# Patient Record
Sex: Female | Born: 1950
Health system: Southern US, Community
[De-identification: ages and names within clinical notes are randomized; demographics above are authoritative.]

## PROBLEM LIST (undated history)

## (undated) DIAGNOSIS — F32A Depression, unspecified: Secondary | ICD-10-CM

## (undated) DIAGNOSIS — I10 Essential (primary) hypertension: Secondary | ICD-10-CM

## (undated) DIAGNOSIS — Z9889 Other specified postprocedural states: Secondary | ICD-10-CM

## (undated) DIAGNOSIS — M1990 Unspecified osteoarthritis, unspecified site: Secondary | ICD-10-CM

## (undated) DIAGNOSIS — F419 Anxiety disorder, unspecified: Secondary | ICD-10-CM

## (undated) DIAGNOSIS — K219 Gastro-esophageal reflux disease without esophagitis: Secondary | ICD-10-CM

## (undated) DIAGNOSIS — F329 Major depressive disorder, single episode, unspecified: Secondary | ICD-10-CM

## (undated) DIAGNOSIS — E119 Type 2 diabetes mellitus without complications: Secondary | ICD-10-CM

## (undated) DIAGNOSIS — E785 Hyperlipidemia, unspecified: Secondary | ICD-10-CM

## (undated) DIAGNOSIS — J45909 Unspecified asthma, uncomplicated: Secondary | ICD-10-CM

## (undated) DIAGNOSIS — M797 Fibromyalgia: Secondary | ICD-10-CM

## (undated) DIAGNOSIS — R112 Nausea with vomiting, unspecified: Secondary | ICD-10-CM

## (undated) HISTORY — PX: SHOULDER ARTHROSCOPY: SHX128

## (undated) HISTORY — PX: DILATION AND CURETTAGE OF UTERUS: SHX78

## (undated) HISTORY — PX: TUBAL LIGATION: SHX77

## (undated) HISTORY — PX: COLONOSCOPY: SHX174

## (undated) HISTORY — PX: KNEE ARTHROSCOPY: SHX127

---

## 1998-04-18 ENCOUNTER — Other Ambulatory Visit: Admission: RE | Admit: 1998-04-18 | Discharge: 1998-04-18 | Payer: Self-pay | Admitting: Obstetrics and Gynecology

## 1999-04-18 ENCOUNTER — Other Ambulatory Visit: Admission: RE | Admit: 1999-04-18 | Discharge: 1999-04-18 | Payer: Self-pay | Admitting: Obstetrics and Gynecology

## 2000-03-10 ENCOUNTER — Encounter: Admission: RE | Admit: 2000-03-10 | Discharge: 2000-03-10 | Payer: Self-pay | Admitting: Family Medicine

## 2000-03-10 ENCOUNTER — Encounter: Payer: Self-pay | Admitting: Family Medicine

## 2000-04-15 ENCOUNTER — Other Ambulatory Visit: Admission: RE | Admit: 2000-04-15 | Discharge: 2000-04-15 | Payer: Self-pay | Admitting: Obstetrics and Gynecology

## 2001-02-17 ENCOUNTER — Encounter: Admission: RE | Admit: 2001-02-17 | Discharge: 2001-02-17 | Payer: Self-pay | Admitting: Obstetrics and Gynecology

## 2001-02-17 ENCOUNTER — Encounter: Payer: Self-pay | Admitting: Obstetrics and Gynecology

## 2001-04-15 ENCOUNTER — Other Ambulatory Visit: Admission: RE | Admit: 2001-04-15 | Discharge: 2001-04-15 | Payer: Self-pay | Admitting: Obstetrics and Gynecology

## 2001-07-16 ENCOUNTER — Ambulatory Visit (HOSPITAL_COMMUNITY): Admission: RE | Admit: 2001-07-16 | Discharge: 2001-07-16 | Payer: Self-pay | Admitting: Gastroenterology

## 2001-08-20 ENCOUNTER — Emergency Department (HOSPITAL_COMMUNITY): Admission: EM | Admit: 2001-08-20 | Discharge: 2001-08-20 | Payer: Self-pay | Admitting: Emergency Medicine

## 2001-08-20 ENCOUNTER — Encounter: Payer: Self-pay | Admitting: Emergency Medicine

## 2001-09-02 ENCOUNTER — Encounter: Payer: Self-pay | Admitting: Family Medicine

## 2001-09-02 ENCOUNTER — Encounter: Admission: RE | Admit: 2001-09-02 | Discharge: 2001-09-02 | Payer: Self-pay | Admitting: Family Medicine

## 2001-11-27 ENCOUNTER — Ambulatory Visit (HOSPITAL_BASED_OUTPATIENT_CLINIC_OR_DEPARTMENT_OTHER): Admission: RE | Admit: 2001-11-27 | Discharge: 2001-11-27 | Payer: Self-pay | Admitting: Orthopedic Surgery

## 2002-04-15 ENCOUNTER — Other Ambulatory Visit: Admission: RE | Admit: 2002-04-15 | Discharge: 2002-04-15 | Payer: Self-pay | Admitting: Obstetrics and Gynecology

## 2002-04-23 ENCOUNTER — Encounter: Admission: RE | Admit: 2002-04-23 | Discharge: 2002-04-23 | Payer: Self-pay | Admitting: Obstetrics and Gynecology

## 2002-04-23 ENCOUNTER — Encounter: Payer: Self-pay | Admitting: Obstetrics and Gynecology

## 2003-04-20 ENCOUNTER — Other Ambulatory Visit: Admission: RE | Admit: 2003-04-20 | Discharge: 2003-04-20 | Payer: Self-pay | Admitting: Obstetrics and Gynecology

## 2003-09-17 ENCOUNTER — Encounter: Admission: RE | Admit: 2003-09-17 | Discharge: 2003-09-17 | Payer: Self-pay | Admitting: Orthopedic Surgery

## 2003-11-23 ENCOUNTER — Ambulatory Visit (HOSPITAL_BASED_OUTPATIENT_CLINIC_OR_DEPARTMENT_OTHER): Admission: RE | Admit: 2003-11-23 | Discharge: 2003-11-23 | Payer: Self-pay | Admitting: Orthopedic Surgery

## 2003-11-23 ENCOUNTER — Ambulatory Visit (HOSPITAL_COMMUNITY): Admission: RE | Admit: 2003-11-23 | Discharge: 2003-11-23 | Payer: Self-pay | Admitting: Orthopedic Surgery

## 2004-02-09 ENCOUNTER — Ambulatory Visit (HOSPITAL_COMMUNITY): Admission: RE | Admit: 2004-02-09 | Discharge: 2004-02-09 | Payer: Self-pay | Admitting: Orthopedic Surgery

## 2004-04-25 ENCOUNTER — Other Ambulatory Visit: Admission: RE | Admit: 2004-04-25 | Discharge: 2004-04-25 | Payer: Self-pay | Admitting: Obstetrics and Gynecology

## 2004-04-26 ENCOUNTER — Encounter: Admission: RE | Admit: 2004-04-26 | Discharge: 2004-04-26 | Payer: Self-pay | Admitting: Obstetrics and Gynecology

## 2004-10-17 ENCOUNTER — Encounter: Admission: RE | Admit: 2004-10-17 | Discharge: 2004-10-17 | Payer: Self-pay | Admitting: Rheumatology

## 2005-12-17 ENCOUNTER — Other Ambulatory Visit: Admission: RE | Admit: 2005-12-17 | Discharge: 2005-12-17 | Payer: Self-pay | Admitting: Obstetrics and Gynecology

## 2005-12-25 ENCOUNTER — Ambulatory Visit: Admission: RE | Admit: 2005-12-25 | Discharge: 2005-12-25 | Payer: Self-pay | Admitting: Orthopedic Surgery

## 2006-01-03 ENCOUNTER — Encounter: Admission: RE | Admit: 2006-01-03 | Discharge: 2006-01-03 | Payer: Self-pay | Admitting: Orthopedic Surgery

## 2006-12-23 ENCOUNTER — Other Ambulatory Visit: Admission: RE | Admit: 2006-12-23 | Discharge: 2006-12-23 | Payer: Self-pay | Admitting: Obstetrics and Gynecology

## 2007-03-20 ENCOUNTER — Emergency Department (HOSPITAL_COMMUNITY): Admission: EM | Admit: 2007-03-20 | Discharge: 2007-03-20 | Payer: Self-pay | Admitting: Emergency Medicine

## 2007-04-07 ENCOUNTER — Encounter: Admission: RE | Admit: 2007-04-07 | Discharge: 2007-04-07 | Payer: Self-pay | Admitting: Obstetrics and Gynecology

## 2008-01-31 ENCOUNTER — Emergency Department (HOSPITAL_COMMUNITY): Admission: EM | Admit: 2008-01-31 | Discharge: 2008-01-31 | Payer: Self-pay | Admitting: Emergency Medicine

## 2009-11-30 ENCOUNTER — Encounter: Admission: RE | Admit: 2009-11-30 | Discharge: 2009-11-30 | Payer: Self-pay | Admitting: Family Medicine

## 2010-02-26 ENCOUNTER — Encounter: Admission: RE | Admit: 2010-02-26 | Discharge: 2010-02-26 | Payer: Self-pay | Admitting: Family Medicine

## 2010-11-18 ENCOUNTER — Encounter: Payer: Self-pay | Admitting: Family Medicine

## 2011-03-15 NOTE — Procedures (Signed)
[place]. [facility]  Patient:    [name], [name] Visit Number: [id number] MRN: [id number]          Service Type: Attending:  Mickeal Skinner, M.D. Dictated by:   Mickeal Skinner, M.D. Proc. Date: 07/16/01   CC:         Milford Cage. Laurann Montana, M.D.   Procedure Report  DATE OF BIRTH:  08-14-1951.  REFERRING PHYSICIAN:  Milford Cage. Laurann Montana, M.D.  PROCEDURE PERFORMED:  Colonoscopy.  ENDOSCOPIST:  Mickeal Skinner, M.D.  INDICATIONS FOR PROCEDURE:  The patient is a 60 year old female with Hemoccult positive stool.  PREMEDICATION:  Versed 7.5 mg, fentanyl 75 mcg.  ENDOSCOPE:  Olympus pediatric video colonoscope.  DESCRIPTION OF PROCEDURE:  After obtaining informed consent, the patient was placed in the left lateral decubitus position.  I administered intravenous fentanyl and intravenous Versed to achieve conscious sedation for the procedure.  The patients blood pressure, oxygen saturation and cardiac rhythm were monitored throughout the procedure and documented in the medical record.  Anal inspection was normal.  Digital rectal exam was normal.  The Olympus pediatric video colonoscope was then introduced into the rectum and under direct vision, advanced to the cecum.  Colonic preparation for the exam today was excellent.  Ms. Camporeale does have universal colonic diverticulosis without signs of bleeding or diverticulitis.  Rectum:  Normal.  Sigmoid colon and descending colon:  Normal.  Splenic flexure:  Normal.  Transverse colon:  Normal.  Hepatic flexure:  Normal.  Ascending colon:  Normal.  Cecum and ileocecal valve:  Normal.  ASSESSMENT:  Universal colonic diverticulosis; otherwise normal proctocolonoscopy to the cecum.  No endoscopic evidence for the presence of colorectal neoplasia. Dictated by:   Mickeal Skinner, M.D. Attending:  Mickeal Skinner, M.D. DD:  07/16/01 TD:  07/16/01 Job: [id number]

## 2011-03-15 NOTE — Op Note (Signed)
NAME:  ALICEIA, LAMONICA                         ACCOUNT NO.:  [id number]   MEDICAL RECORD NO.:  [id number]                   PATIENT TYPE:  AMB   LOCATION:  [place]                                  FACILITY:  [facility]   PHYSICIAN:  Estill Bamberg. Ronnie Derby, M.D.              DATE OF BIRTH:  01-17-1951   DATE OF PROCEDURE:  11/23/2003  DATE OF DISCHARGE:                                 OPERATIVE REPORT   PREOPERATIVE DIAGNOSIS:  Left knee medial meniscal tear.   POSTOPERATIVE DIAGNOSIS:  Left knee medial meniscal tear.   OPERATION PERFORMED:  Left knee meniscus repair.   SURGEON:  Estill Bamberg. Ronnie Derby, M.D.   ANESTHESIA:  General.   INDICATIONS FOR PROCEDURE:  The patient is a 60 year old female with MRI  evidence of a meniscus tear and clinical evidence of a meniscus tear as well  as patellofemoral osteoarthritis.  Informed consent was obtained.   DESCRIPTION OF PROCEDURE:  The patient was taken to the operating room and  laid in a supine position and administered general LMA anesthesia.  The left  lower extremity was prepped and draped in the usual sterile fashion.  The  left knee was then prepped and draped in the usual sterile fashion.  Inferolateral and inferomedial portals were created with a #11 blade and  blunt trocar and cannula.  Diagnostic arthroscopy revealed an area of 1 x 1  cm grade 3 to 4 chondromalacia in the inferior trochlea.  It appeared that  this had been previously microfractured.  There was no loose cartilage.  It  appeared to be a stable lesion and the patella, interestingly enough had  very little chondromalacia.  The lateral compartment was completely normal.  ACL and PCL were normal.  The medial femoral condyle had some very small  areas of grade 1 to 2 chondromalacia and the posterior horn of the medial  meniscus had a very obvious undersurface horizontal peripheral tear.  I used  a small great white shaver to debride this interval as well as a rasp and  then I placed a  single clear-fix screw in the posterior horn of the meniscus  which gave excellent fixation.  I then irrigated the knee, took a further  tour to ensure there were no loose bodies and then evacuated the knee.  I  infiltrated both portals with 10 mL of a Marcaine morphine mixture and  closed with interrupted 4-0 nylon sutures, dressed with Xeroform dressing  sponges, sterile Webril and Ace wrap.   TOURNIQUET TIME:  None.   COMPLICATIONS:  None.   ESTIMATED BLOOD LOSS:  Minimal.                                               Estill Bamberg. Ronnie Derby, M.D.    SDL/MEDQ  D:  11/23/2003  T:  11/23/2003  Job:  [id number]

## 2011-03-15 NOTE — Op Note (Signed)
[place]. [facility]  Patient:    [name], [name] Visit Number: [id number] MRN: [id number]          Service Type: DSU Location: [facility] Attending Physician:  Lowella Petties Dictated by:   Alta Corning, M.D. Proc. Date: 11/27/01 Admit Date:  11/27/2001                             Operative Report  PREOPERATIVE DIAGNOSIS:  Patellofemoral pain with known posterior cruciate injury.  POSTOPERATIVE DIAGNOSIS:  Patellofemoral pain with known posterior cruciate injury.  PRINCIPAL PROCEDURE: 1. Debridement of femoral trochlea. 2. Debridement of fibers of posterior cruciate ligament. 3. Lateral retinacular release.  SURGEON:  Alta Corning, M.D.  ASSISTANT:  Alvina Filbert. Natividad Brood.  ANESTHESIA:  General.  BRIEF HISTORY:  She is a 59 year old female with a long history of having knee pain after a fall down the stairs.  She had an MRI which showed that she had a posterior cruciate injury.  We could not convince ourselves that she was unstable preoperatively, but she did continue have what appeared to anterior knee pain.  Because of her persistent complaints of anterior knee pain, the patient was ultimately taken to the operating room for evaluation under anesthesia and arthroscopy.  DESCRIPTION OF PROCEDURE:  The patient was brought to the operating room and after adequate anesthesia was obtained with a general anesthetic, the patient was placed on the operating table.  The left leg was then examined under anesthesia.  She was certainly noted to have some increased instability compared to the opposite side, but did have a stable posterior drawer as well as anterior drawer, although the laxity was probably a centimeter total, where on the opposite side, it was probably 5 mm.  It was felt based on this that she did not need any kind of cruciate reconstruction.  At this point, the leg was prepped and draped in the usual sterile fashion. Routine  arthroscopic examination revealed the medial side was without significant abnormality.  No significant evidence of arthritis.  The lateral side showed some fray of the tibial plateau which was debrided with the suction shaver.  Attention was then turned to the fibers of the posterior cruciate ligament which were debrided as they stuck out of the medial compartment, although a formal debridement of the posterior cruciate was not undertaken.  At this point, attention was turned up in the patellofemoral joint, where there was noted to be some lateral patellar tracking as well as grade 2 or 3 changes in the femoral trochlea.  The femoral trochlea was debrided with the suction shaver back to a smooth and stable rim.  Attention was then turned to the lateral retinaculum where a lateral retinacular release was performed.  The patella then did allow elevation and more midline tracking.  At this point, the knee was copiously irrigated and suctioned dry.  A sterile compressive dressing was applied and the patient taken to the recovery room where she was noted to be in satisfactory condition.  Estimated blood loss for the procedure was none. Dictated by:   Alta Corning, M.D. Attending Physician:  Lowella Petties DD:  11/27/01 TD:  11/27/01 Job: [id number]

## 2011-11-26 ENCOUNTER — Other Ambulatory Visit (HOSPITAL_COMMUNITY)
Admission: RE | Admit: 2011-11-26 | Discharge: 2011-11-26 | Disposition: A | Discharge status: Home / Self Care | Payer: Medicare Other | Source: Ambulatory Visit | Attending: Family Medicine | Admitting: Family Medicine

## 2011-11-26 ENCOUNTER — Other Ambulatory Visit: Payer: Self-pay | Admitting: Family Medicine

## 2011-11-26 DIAGNOSIS — Z124 Encounter for screening for malignant neoplasm of cervix: Secondary | ICD-10-CM | POA: Insufficient documentation

## 2011-11-28 ENCOUNTER — Other Ambulatory Visit: Payer: Self-pay | Admitting: Family Medicine

## 2011-11-28 DIAGNOSIS — Z1231 Encounter for screening mammogram for malignant neoplasm of breast: Secondary | ICD-10-CM

## 2011-12-18 ENCOUNTER — Ambulatory Visit
Admission: RE | Admit: 2011-12-18 | Discharge: 2011-12-18 | Disposition: A | Discharge status: Home / Self Care | Payer: Medicare Other | Source: Ambulatory Visit | Attending: Family Medicine | Admitting: Family Medicine

## 2011-12-18 DIAGNOSIS — Z1231 Encounter for screening mammogram for malignant neoplasm of breast: Secondary | ICD-10-CM

## 2012-08-07 ENCOUNTER — Other Ambulatory Visit: Payer: Self-pay | Admitting: Orthopedic Surgery

## 2012-08-10 ENCOUNTER — Encounter (HOSPITAL_BASED_OUTPATIENT_CLINIC_OR_DEPARTMENT_OTHER): Payer: Self-pay | Admitting: *Deleted

## 2012-08-10 NOTE — Progress Notes (Signed)
To come in for bmet-will call for ekg dr Laurann Montana

## 2012-08-11 ENCOUNTER — Encounter (HOSPITAL_BASED_OUTPATIENT_CLINIC_OR_DEPARTMENT_OTHER)
Admission: RE | Admit: 2012-08-11 | Discharge: 2012-08-11 | Disposition: A | Discharge status: Home / Self Care | Payer: Medicare Other | Source: Ambulatory Visit | Attending: Orthopedic Surgery | Admitting: Orthopedic Surgery

## 2012-08-11 LAB — BASIC METABOLIC PANEL
BUN: 16 mg/dL (ref 6–23)
CO2: 27 mEq/L (ref 19–32)
Calcium: 9.2 mg/dL (ref 8.4–10.5)
Chloride: 107 mEq/L (ref 96–112)
Creatinine, Ser: 0.89 mg/dL (ref 0.50–1.10)
GFR calc Af Amer: 78 mL/min — ABNORMAL LOW (ref >90)
GFR calc non Af Amer: 68 mL/min — ABNORMAL LOW (ref >90)
Glucose, Bld: 90 mg/dL (ref 70–99)
Potassium: 4.8 mEq/L (ref 3.5–5.1)
Sodium: 139 mEq/L (ref 135–145)

## 2012-08-12 NOTE — H&P (Signed)
[name] is an 61 y.o. female.   Chief Complaint: c/o chronic and progressive STS symptoms of the right long finger HPI: She has pain in her long finger, a flexion contracture of her left finger PIP joint, swelling and tenderness over the palm in the region of the flexor sheath to the long finger at the A-1 pulley.  She also has background osteoarthritis of her IP joints and numbness in her median innervated fingers.    Past Medical History  Diagnosis Date  . Arthritis   . Fibromyalgia   . GERD (gastroesophageal reflux disease)   . PONV (postoperative nausea and vomiting)   . Hypertension   . Asthma   . Depression   . Anxiety   . Diabetes mellitus without complication   . Hyperlipemia     Past Surgical History  Procedure Date  . Knee arthroscopy [id number]    left  . Shoulder arthroscopy     left  . Tubal ligation   . Dilation and curettage of uterus   . Colonoscopy     No family history on file. Social History:  reports that she quit smoking about 2 years ago. She does not have any smokeless tobacco history on file. She reports that she does not drink alcohol or use illicit drugs.  Allergies:  Allergies  Allergen Reactions  . Asa (Aspirin) Nausea And Vomiting  . Sulfa Antibiotics Hives  . Talwin (Pentazocine)     nervous  . Tetracyclines & Related Hives    No prescriptions prior to admission    Results for orders placed during the hospital encounter of 08/13/12 (from the past 48 hour(s))  BASIC METABOLIC PANEL     Status: Abnormal   Collection Time   08/11/12 12:10 PM      Component Value Range Comment   Sodium 139  135 - 145 mEq/L    Potassium 4.8  3.5 - 5.1 mEq/L    Chloride 107  96 - 112 mEq/L    CO2 27  19 - 32 mEq/L    Glucose, Bld 90  70 - 99 mg/dL    BUN 16  6 - 23 mg/dL    Creatinine, Ser 0.89  0.50 - 1.10 mg/dL    Calcium 9.2  8.4 - 10.5 mg/dL    GFR calc non Af Amer 68 (*) >90 mL/min    GFR calc Af Amer 78 (*) >90 mL/min     No results  found.   Pertinent items are noted in HPI.  Height 5\' 4"  (1.626 m), weight 74.39 kg (164 lb).  General appearance: alert Head: Normocephalic, without obvious abnormality Neck: supple, symmetrical, trachea midline Resp: clear to auscultation bilaterally Cardio: regular rate and rhythm GI: normal findings: bowel sounds normal Extremities: She has obvious osteoarthritis with Heberden's and Bouchard's nodes.  She has a 10 degree flexion contracture of her right long finger PIP joint.  She is tender over the A-1 pulley.  She has active stenosing tenosynovitis of her right long finger at the A-1 pulley.  She has clinical signs consistent with carpal tunnel syndrome.  Pulses: 2+ and symmetric Skin: normal Neurologic: Grossly normal    Assessment/Plan Impression: Right long finger STS  Plan: To the OR for release A-1 pulley right long finger.The procedure, risks,benefits and post-op course were discussed with the patient at length and they were in agreement with the plan.   DASNOIT,[name] 08/12/2012, 2:53 PM     H&P documentation: 08/13/2012  -History and Physical Reviewed  -  Patient has been re-examined  -No change in the plan of care  [name], MD

## 2012-08-13 ENCOUNTER — Ambulatory Visit (HOSPITAL_BASED_OUTPATIENT_CLINIC_OR_DEPARTMENT_OTHER): Payer: Medicare Other | Admitting: Certified Registered"

## 2012-08-13 ENCOUNTER — Encounter (HOSPITAL_BASED_OUTPATIENT_CLINIC_OR_DEPARTMENT_OTHER): Payer: Self-pay | Admitting: Certified Registered"

## 2012-08-13 ENCOUNTER — Ambulatory Visit (HOSPITAL_BASED_OUTPATIENT_CLINIC_OR_DEPARTMENT_OTHER)
Admission: RE | Admit: 2012-08-13 | Discharge: 2012-08-13 | Disposition: A | Discharge status: Home / Self Care | Payer: Medicare Other | Source: Ambulatory Visit | Attending: Orthopedic Surgery | Admitting: Orthopedic Surgery

## 2012-08-13 ENCOUNTER — Encounter (HOSPITAL_BASED_OUTPATIENT_CLINIC_OR_DEPARTMENT_OTHER)
Admission: RE | Disposition: A | Discharge status: Home / Self Care | Payer: Self-pay | Source: Ambulatory Visit | Attending: Orthopedic Surgery

## 2012-08-13 ENCOUNTER — Encounter (HOSPITAL_BASED_OUTPATIENT_CLINIC_OR_DEPARTMENT_OTHER): Payer: Self-pay | Admitting: *Deleted

## 2012-08-13 DIAGNOSIS — E119 Type 2 diabetes mellitus without complications: Secondary | ICD-10-CM | POA: Insufficient documentation

## 2012-08-13 DIAGNOSIS — I10 Essential (primary) hypertension: Secondary | ICD-10-CM | POA: Insufficient documentation

## 2012-08-13 DIAGNOSIS — M65839 Other synovitis and tenosynovitis, unspecified forearm: Secondary | ICD-10-CM | POA: Insufficient documentation

## 2012-08-13 DIAGNOSIS — K219 Gastro-esophageal reflux disease without esophagitis: Secondary | ICD-10-CM | POA: Insufficient documentation

## 2012-08-13 DIAGNOSIS — M20099 Other deformity of finger(s), unspecified finger(s): Secondary | ICD-10-CM | POA: Insufficient documentation

## 2012-08-13 HISTORY — DX: Gastro-esophageal reflux disease without esophagitis: K21.9

## 2012-08-13 HISTORY — PX: TRIGGER FINGER RELEASE: SHX641

## 2012-08-13 HISTORY — DX: Unspecified osteoarthritis, unspecified site: M19.90

## 2012-08-13 HISTORY — DX: Major depressive disorder, single episode, unspecified: F32.9

## 2012-08-13 HISTORY — DX: Nausea with vomiting, unspecified: R11.2

## 2012-08-13 HISTORY — DX: Hyperlipidemia, unspecified: E78.5

## 2012-08-13 HISTORY — DX: Depression, unspecified: F32.A

## 2012-08-13 HISTORY — DX: Anxiety disorder, unspecified: F41.9

## 2012-08-13 HISTORY — DX: Type 2 diabetes mellitus without complications: E11.9

## 2012-08-13 HISTORY — DX: Unspecified asthma, uncomplicated: J45.909

## 2012-08-13 HISTORY — DX: Essential (primary) hypertension: I10

## 2012-08-13 HISTORY — DX: Other specified postprocedural states: Z98.890

## 2012-08-13 HISTORY — DX: Fibromyalgia: M79.7

## 2012-08-13 LAB — GLUCOSE, CAPILLARY
Glucose-Capillary: 106 mg/dL — ABNORMAL HIGH (ref 70–99)
Glucose-Capillary: 81 mg/dL (ref 70–99)

## 2012-08-13 LAB — POCT HEMOGLOBIN-HEMACUE: Hemoglobin: 12.4 g/dL (ref 12.0–15.0)

## 2012-08-13 SURGERY — RELEASE, A1 PULLEY, FOR TRIGGER FINGER
Anesthesia: Monitor Anesthesia Care | Site: Finger | Laterality: Right | Wound class: Clean

## 2012-08-13 MED ORDER — MIDAZOLAM HCL 5 MG/5ML IJ SOLN
INTRAMUSCULAR | Status: DC | PRN
  Administered 2012-08-13: 2 mg via INTRAVENOUS

## 2012-08-13 MED ORDER — HYDROCODONE-ACETAMINOPHEN 5-325 MG PO TABS: ORAL_TABLET | ORAL | Status: DC

## 2012-08-13 MED ORDER — OXYCODONE HCL 5 MG PO TABS: 5.0000 mg | ORAL_TABLET | Freq: Once | ORAL | Status: DC | PRN

## 2012-08-13 MED ORDER — CHLORHEXIDINE GLUCONATE 4 % EX LIQD: 60.0000 mL | Freq: Once | CUTANEOUS | Status: DC

## 2012-08-13 MED ORDER — OXYCODONE HCL 5 MG/5ML PO SOLN: 5.0000 mg | Freq: Once | ORAL | Status: DC | PRN

## 2012-08-13 MED ORDER — DROPERIDOL 2.5 MG/ML IJ SOLN: 0.6250 mg | INTRAMUSCULAR | Status: DC | PRN

## 2012-08-13 MED ORDER — PROPOFOL 10 MG/ML IV EMUL
INTRAVENOUS | Status: DC | PRN
  Administered 2012-08-13: 200 ug/kg/min via INTRAVENOUS

## 2012-08-13 MED ORDER — LIDOCAINE HCL (CARDIAC) 20 MG/ML IV SOLN
INTRAVENOUS | Status: DC | PRN
  Administered 2012-08-13: 50 mg via INTRAVENOUS

## 2012-08-13 MED ORDER — LACTATED RINGERS IV SOLN
INTRAVENOUS | Status: DC
  Administered 2012-08-13: 08:00:00 via INTRAVENOUS

## 2012-08-13 MED ORDER — CEPHALEXIN 500 MG PO CAPS: 500.0000 mg | ORAL_CAPSULE | Freq: Three times a day (TID) | ORAL | Status: DC

## 2012-08-13 MED ORDER — LIDOCAINE HCL (PF) 2 % IJ SOLN
INTRAMUSCULAR | Status: DC | PRN
  Administered 2012-08-13: 3 mL

## 2012-08-13 MED ORDER — PROPOFOL 10 MG/ML IV EMUL: INTRAVENOUS | Status: DC | PRN

## 2012-08-13 MED ORDER — HYDROMORPHONE HCL PF 1 MG/ML IJ SOLN
0.2500 mg | INTRAMUSCULAR | Status: DC | PRN
  Administered 2012-08-13: 0.5 mg via INTRAVENOUS

## 2012-08-13 SURGICAL SUPPLY — 32 items
BLADE SURG 15 STRL LF DISP TIS (BLADE) ×1 IMPLANT
BLADE SURG 15 STRL SS (BLADE) ×1
BNDG ELASTIC 2 VLCR STRL LF (GAUZE/BANDAGES/DRESSINGS) ×2 IMPLANT
BNDG ESMARK 4X9 LF (GAUZE/BANDAGES/DRESSINGS) IMPLANT
BRUSH SCRUB EZ PLAIN DRY (MISCELLANEOUS) ×2 IMPLANT
CLOTH BEACON ORANGE TIMEOUT ST (SAFETY) ×2 IMPLANT
CORDS BIPOLAR (ELECTRODE) ×2 IMPLANT
COVER MAYO STAND STRL (DRAPES) ×2 IMPLANT
COVER TABLE BACK 60X90 (DRAPES) ×2 IMPLANT
CUFF TOURNIQUET SINGLE 18IN (TOURNIQUET CUFF) ×2 IMPLANT
DECANTER SPIKE VIAL GLASS SM (MISCELLANEOUS) IMPLANT
DRAPE EXTREMITY T 121X128X90 (DRAPE) ×2 IMPLANT
DRAPE SURG 17X23 STRL (DRAPES) ×2 IMPLANT
GAUZE SPONGE 4X4 12PLY STRL LF (GAUZE/BANDAGES/DRESSINGS) ×4 IMPLANT
GAUZE XEROFORM 1X8 LF (GAUZE/BANDAGES/DRESSINGS) ×2 IMPLANT
GLOVE BIO SURGEON STRL SZ 6.5 (GLOVE) ×2 IMPLANT
GLOVE BIOGEL M STRL SZ7.5 (GLOVE) ×2 IMPLANT
GLOVE BIOGEL PI IND STRL 7.0 (GLOVE) ×1 IMPLANT
GLOVE BIOGEL PI INDICATOR 7.0 (GLOVE) ×1
GLOVE ORTHO TXT STRL SZ7.5 (GLOVE) ×2 IMPLANT
GOWN PREVENTION PLUS XLARGE (GOWN DISPOSABLE) ×2 IMPLANT
GOWN STRL REIN XL XLG (GOWN DISPOSABLE) ×4 IMPLANT
NEEDLE 27GAX1X1/2 (NEEDLE) IMPLANT
PACK BASIN DAY SURGERY FS (CUSTOM PROCEDURE TRAY) ×2 IMPLANT
PAD CAST 4YDX4 CTTN HI CHSV (CAST SUPPLIES) ×1 IMPLANT
PADDING CAST COTTON 4X4 STRL (CAST SUPPLIES) ×1
SPONGE GAUZE 4X4 12PLY (GAUZE/BANDAGES/DRESSINGS) ×2 IMPLANT
STOCKINETTE 4X48 STRL (DRAPES) ×2 IMPLANT
SYR CONTROL 10ML LL (SYRINGE) IMPLANT
TOWEL OR 17X24 6PK STRL BLUE (TOWEL DISPOSABLE) ×2 IMPLANT
UNDERPAD 30X30 INCONTINENT (UNDERPADS AND DIAPERS) ×2 IMPLANT
WATER STERILE IRR 1000ML POUR (IV SOLUTION) IMPLANT

## 2012-08-13 NOTE — Brief Op Note (Signed)
08/13/2012  8:13 AM  PATIENT:  [name]  61 y.o. female  PRE-OPERATIVE DIAGNOSIS:  LOCKING TRIGGER FINGER RIGHT LONG  POST-OPERATIVE DIAGNOSIS:  locking trigger finger right long  PROCEDURE:  Procedure(s) (LRB) with comments: RELEASE TRIGGER FINGER/A-1 PULLEY (Right) - long finger  SURGEON:  Surgeon(s) and Role:    * [name]., MD - Primary  PHYSICIAN ASSISTANT:   ASSISTANTS:Jenille Laszlo Dasnoit,P.A-C   ANESTHESIA:   MAC  EBL:  Total I/O In: 200 [I.V.:200] Out: -   BLOOD ADMINISTERED:none  DRAINS: none   LOCAL MEDICATIONS USED:  XYLOCAINE   SPECIMEN:  No Specimen  DISPOSITION OF SPECIMEN:  N/A  COUNTS:  YES  TOURNIQUET:  * Missing tourniquet times found for documented tourniquets in log:  [id number] *  DICTATION: .Other Dictation: Dictation Number [id number]  PLAN OF CARE: Discharge to home after PACU  PATIENT DISPOSITION:  PACU - hemodynamically stable.

## 2012-08-13 NOTE — Anesthesia Preprocedure Evaluation (Addendum)
Anesthesia Evaluation  Patient identified by MRN, date of birth, ID band Patient awake    Reviewed: Allergy & Precautions, H&P , NPO status , Patient's Chart, lab work & pertinent test results, reviewed documented beta blocker date and time   History of Anesthesia Complications (+) PONV  Airway Mallampati: II TM Distance: >3 FB Neck ROM: Full    Dental  (+) Teeth Intact, Dental Advisory Given and Partial Upper   Pulmonary asthma ,    Pulmonary exam normal       Cardiovascular hypertension, Pt. on medications     Neuro/Psych PSYCHIATRIC DISORDERS Anxiety Depression negative neurological ROS     GI/Hepatic Neg liver ROS, GERD-  Medicated and Controlled,  Endo/Other  diabetes, Well Controlled, Type 1  Renal/GU negative Renal ROS     Musculoskeletal  (+) Fibromyalgia -  Abdominal   Peds  Hematology   Anesthesia Other Findings   Reproductive/Obstetrics                          Anesthesia Physical Anesthesia Plan  ASA: II  Anesthesia Plan: General and MAC   Post-op Pain Management:    Induction: Intravenous  Airway Management Planned: LMA and Simple Face Mask  Additional Equipment:   Intra-op Plan:   Post-operative Plan:   Informed Consent: I have reviewed the patients History and Physical, chart, labs and discussed the procedure including the risks, benefits and alternatives for the proposed anesthesia with the patient or authorized representative who has indicated his/her understanding and acceptance.   Dental advisory given  Plan Discussed with: CRNA, Anesthesiologist and Surgeon  Anesthesia Plan Comments:         Anesthesia Quick Evaluation

## 2012-08-13 NOTE — Transfer of Care (Signed)
Immediate Anesthesia Transfer of Care Note  Patient: [name]  Procedure(s) Performed: Procedure(s) (LRB) with comments: RELEASE TRIGGER FINGER/A-1 PULLEY (Right) - long finger  Patient Location: PACU  Anesthesia Type: MAC  Level of Consciousness: awake, alert , oriented and patient cooperative  Airway & Oxygen Therapy: Patient Spontanous Breathing and Patient connected to face mask oxygen  Post-op Assessment: Report given to PACU RN and Post -op Vital signs reviewed and stable  Post vital signs: Reviewed and stable  Complications: No apparent anesthesia complications

## 2012-08-13 NOTE — Anesthesia Procedure Notes (Signed)
Procedure Name: MAC Date/Time: 08/13/2012 7:57 AM Performed by: Denny Levy Pre-anesthesia Checklist: Patient identified, Timeout performed, Emergency Drugs available, Suction available and Patient being monitored Patient Re-evaluated:Patient Re-evaluated prior to inductionOxygen Delivery Method: Simple face mask Placement Confirmation: positive ETCO2

## 2012-08-13 NOTE — Anesthesia Postprocedure Evaluation (Signed)
Anesthesia Post Note  Patient: [name]  Procedure(s) Performed: Procedure(s) (LRB): RELEASE TRIGGER FINGER/A-1 PULLEY (Right)  Anesthesia type: MAC  Patient location: PACU  Post pain: Pain level controlled  Post assessment: Patient's Cardiovascular Status Stable  Last Vitals:  Filed Vitals:   08/13/12 0830  BP: 92/77  Pulse: 66  Temp:   Resp: 13    Post vital signs: Reviewed and stable  Level of consciousness: sedated  Complications: No apparent anesthesia complications

## 2012-08-14 ENCOUNTER — Encounter (HOSPITAL_BASED_OUTPATIENT_CLINIC_OR_DEPARTMENT_OTHER): Payer: Self-pay | Admitting: Orthopedic Surgery

## 2012-08-14 NOTE — Op Note (Signed)
[name], [name]               ACCOUNT NO.:  [id number]  MEDICAL RECORD NO.:  [id number]  LOCATION:                                 FACILITY:  PHYSICIAN:  Youlanda Mighty. Jakhai Fant, M.D. DATE OF BIRTH:  16-Jul-1951  DATE OF PROCEDURE:  08/13/2012 DATE OF DISCHARGE:                              OPERATIVE REPORT   PREOPERATIVE DIAGNOSIS:  Chronic stenosing tenosynovitis, right long finger at A1 pulley with flexion contracture proximal interphalangeal joint.  POSTOPERATIVE DIAGNOSIS:  Chronic stenosing tenosynovitis, right long finger at A1 pulley with flexion contracture proximal interphalangeal joint.  OPERATIONS:  Release of right long finger A1 pulley and gentle manipulation of PIP joint to release flexion contracture.  OPERATING SURGEON:  Youlanda Mighty. Valeria Krisko, MD  ASSISTANT:  Marily Lente Dasnoit, PA-C  ANESTHESIA:  Lidocaine 2%, flexor sheath block at metacarpal head level block of right long finger.  This was performed with supplemental IV sedation, i.e., monitored anesthesia care.  SUPERVISING ANESTHESIOLOGIST:  Nelda Severe. Tobias Alexander, MD  INDICATION:  [name] is a 61 year old woman referred through courtesy of Dr. Bo Merino for evaluation and management of a chronically swollen stiff and triggering right long finger.  [name] is on chronic disability with diabetes, left arm lymphedema, and multiple other confounding medical problems.  She was referred due to chronic stenosing tenosynovitis to right long finger, unresponsive to nonoperative measures.  On clinical examination, she is noted to have a 10-degree flexion contracture of her right long finger PIP joint.  She has multiple drug allergies.  We recommended proceeding with release of the A1 pulley under local anesthesia and sedation.  After informed consent, she is brought to the operating at this time.  Preoperatively, she had detailed anesthesia informed consent.  She was concerned about a poorly characterized  lymphedema of left arm.  She declined IV access in the left arm and in the external jugular veins. We then placed an IV at her right antecubital fossa and we used an Esmarch to allow a bloodless field for right hand surgery on the forearm.  [name] was transferred to room 1 of the [facility] and placed in supine position on the operating table.  Under Dr. Glynda Jaeger supervision, IV sedation was provided.  The right palm was prepped with Betadine followed by infiltration of 3 mL of 2% plain lidocaine into the path of the intended incision and around the common digital nerves to the index long and ring fingers.  After 5 minutes, excellent anesthesia was achieved.  The right hand and arm were then prepped with Betadine soap and solution, sterilely draped.  Following a routine surgical time-out, the hand and arm were exsanguinated with an Esmarch bandage, it was left on the proximal forearm as a tourniquet.  Procedure commenced with an oblique incision in the distal palmar crease.  Subcutaneous tissues were carefully divided.  Only some pathologic palmar fascia that was released with scissors.  The A1 pulley was invested with inflammatory tenosynovitis.  This was cleared with scissors and Valora Corporal followed by release of the A1 pulley.  A small A0 pulley was identified and released in the fibrotic tenosynovium at the preputial fold was likewise excised.  Thereafter, full passive range of motion of the fingers was recovered in flexion without locking.  We then flexed the MP joint 90 degrees and gently extended the PIP joint.  It extended to 0 degrees.  The wound was then repaired with intradermal 4-0 Prolene suture.  A compressive dressing was applied with Steri-Strips, sterile gauze, and Ace wrap.  There were no apparent complications.  For aftercare, [name] is provided prescription for Keflex 500 mg 1 p.o. q.8 h. x4 days as a prophylactic antibiotic and prescription  for Vicodin 5 mg 1 p.o. q.4-6 h. p.r.n. pain, 15 tablets without refill.     Youlanda Mighty Trica Usery, M.D.     RVS/MEDQ  D:  08/13/2012  T:  08/13/2012  Job:  [id number]  cc:   Leary Roca, MD

## 2013-10-01 ENCOUNTER — Ambulatory Visit (INDEPENDENT_AMBULATORY_CARE_PROVIDER_SITE_OTHER): Payer: Medicare PPO | Admitting: Podiatrist

## 2013-10-01 ENCOUNTER — Encounter: Payer: Self-pay | Admitting: Podiatrist

## 2013-10-01 VITALS — BP 143/86 | HR 74 | Resp 12

## 2013-10-01 DIAGNOSIS — E1049 Type 1 diabetes mellitus with other diabetic neurological complication: Secondary | ICD-10-CM

## 2013-10-01 DIAGNOSIS — M79609 Pain in unspecified limb: Secondary | ICD-10-CM

## 2013-10-01 DIAGNOSIS — Q828 Other specified congenital malformations of skin: Secondary | ICD-10-CM

## 2013-10-01 DIAGNOSIS — M722 Plantar fascial fibromatosis: Secondary | ICD-10-CM

## 2013-10-01 DIAGNOSIS — B351 Tinea unguium: Secondary | ICD-10-CM

## 2013-10-01 NOTE — Progress Notes (Deleted)
   Subjective:    Patient ID: [name], female    DOB: 1951-01-09, 62 y.o.   MRN: [id number]  HPI Comments: '' TOENAILS TRIM''     Review of Systems     Objective:   Physical Exam Nails x 10--- lesions sub right hallux, sub right 4;  Left sub 5th.         Assessment & Plan:

## 2013-10-01 NOTE — Patient Instructions (Signed)
Diabetes and Foot Care Diabetes may cause you to have problems because of poor blood supply (circulation) to your feet and legs. This may cause the skin on your feet to become thinner, break easier, and heal more slowly. Your skin may become dry, and the skin may peel and crack. You may also have nerve damage in your legs and feet causing decreased feeling in them. You may not notice minor injuries to your feet that could lead to infections or more serious problems. Taking care of your feet is one of the most important things you can do for yourself.  HOME CARE INSTRUCTIONS  Wear shoes at all times, even in the house. Do not go barefoot. Bare feet are easily injured.  Check your feet daily for blisters, cuts, and redness. If you cannot see the bottom of your feet, use a mirror or ask someone for help.  Wash your feet with warm water (do not use hot water) and mild soap. Then pat your feet and the areas between your toes until they are completely dry. Do not soak your feet as this can dry your skin.  Apply a moisturizing lotion or petroleum jelly (that does not contain alcohol and is unscented) to the skin on your feet and to dry, brittle toenails. Do not apply lotion between your toes.  Trim your toenails straight across. Do not dig under them or around the cuticle. File the edges of your nails with an emery board or nail file.  Do not cut corns or calluses or try to remove them with medicine.  Wear clean socks or stockings every day. Make sure they are not too tight. Do not wear knee-high stockings since they may decrease blood flow to your legs.  Wear shoes that fit properly and have enough cushioning. To break in new shoes, wear them for just a few hours a day. This prevents you from injuring your feet. Always look in your shoes before you put them on to be sure there are no objects inside.  Do not cross your legs. This may decrease the blood flow to your feet.  If you find a minor scrape,  cut, or break in the skin on your feet, keep it and the skin around it clean and dry. These areas may be cleansed with mild soap and water. Do not cleanse the area with peroxide, alcohol, or iodine.  When you remove an adhesive bandage, be sure not to damage the skin around it.  If you have a wound, look at it several times a day to make sure it is healing.  Do not use heating pads or hot water bottles. They may burn your skin. If you have lost feeling in your feet or legs, you may not know it is happening until it is too late.  Make sure your health care provider performs a complete foot exam at least annually or more often if you have foot problems. Report any cuts, sores, or bruises to your health care provider immediately. SEEK MEDICAL CARE IF:   You have an injury that is not healing.  You have cuts or breaks in the skin.  You have an ingrown nail.  You notice redness on your legs or feet.  You feel burning or tingling in your legs or feet.  You have pain or cramps in your legs and feet.  Your legs or feet are numb.  Your feet always feel cold. SEEK IMMEDIATE MEDICAL CARE IF:   There is increasing redness,   swelling, or pain in or around a wound.  There is a red line that goes up your leg.  Pus is coming from a wound.  You develop a fever or as directed by your health care provider.  You notice a bad smell coming from an ulcer or wound. Document Released: 10/11/2000 Document Revised: 06/16/2013 Document Reviewed: 03/23/2013 [employer] Patient Information 2014 [employer].  

## 2013-10-04 NOTE — Progress Notes (Signed)
HPI:  Patient presents today for follow up of foot and nail care. Denies any new complaints today.  Objective:  Patients chart is reviewed.  Neurovascular status unchanged.  Patients nails are thickened, discolored, distrophic, friable and brittle with yellow-brown discoloration. Patient subjectively relates they are painful with shoes and with ambulation of bilateral feet.  Hyperkeratotic lesions present sub hallux, right; sub 4th metatarsal right, submetatarsal 5 left.   Assessment:  Symptomatic onychomycosis  Plan:  Discussed treatment options and alternatives.  The symptomatic toenails were debrided through manual an mechanical means without complication.  Return appointment recommended at routine intervals of 3 months    Trudie Buckler, DPM

## 2013-10-22 ENCOUNTER — Telehealth: Payer: Self-pay | Admitting: Podiatrist

## 2013-10-22 NOTE — Telephone Encounter (Signed)
PT. STATED THAT HER RT FOOT 1ST AND 2ND TOENAIL STILL HURTING AFTER SEEING DR. Valentina Lucks.  NOTIFIED PT TO SOAK HER FOOT WITH EPSON SALT AND IF ANY DRAINAGE WILL GIVE HER APPOINTMENT TO SEE THE DR.

## 2013-12-09 ENCOUNTER — Ambulatory Visit (INDEPENDENT_AMBULATORY_CARE_PROVIDER_SITE_OTHER): Payer: Medicare PPO | Admitting: Podiatrist

## 2013-12-09 ENCOUNTER — Encounter: Payer: Self-pay | Admitting: Podiatrist

## 2013-12-09 VITALS — BP 140/78 | HR 67 | Resp 12

## 2013-12-09 DIAGNOSIS — B351 Tinea unguium: Secondary | ICD-10-CM

## 2013-12-09 DIAGNOSIS — M79609 Pain in unspecified limb: Secondary | ICD-10-CM

## 2013-12-09 DIAGNOSIS — L608 Other nail disorders: Secondary | ICD-10-CM

## 2013-12-09 MED ORDER — AMOXICILLIN-POT CLAVULANATE 875-125 MG PO TABS: 1.0000 | ORAL_TABLET | Freq: Two times a day (BID) | ORAL | Status: DC

## 2013-12-09 NOTE — Patient Instructions (Signed)
Diabetes and Foot Care Diabetes may cause you to have problems because of poor blood supply (circulation) to your feet and legs. This may cause the skin on your feet to become thinner, break easier, and heal more slowly. Your skin may become dry, and the skin may peel and crack. You may also have nerve damage in your legs and feet causing decreased feeling in them. You may not notice minor injuries to your feet that could lead to infections or more serious problems. Taking care of your feet is one of the most important things you can do for yourself.  HOME CARE INSTRUCTIONS  Wear shoes at all times, even in the house. Do not go barefoot. Bare feet are easily injured.  Check your feet daily for blisters, cuts, and redness. If you cannot see the bottom of your feet, use a mirror or ask someone for help.  Wash your feet with warm water (do not use hot water) and mild soap. Then pat your feet and the areas between your toes until they are completely dry. Do not soak your feet as this can dry your skin.  Apply a moisturizing lotion or petroleum jelly (that does not contain alcohol and is unscented) to the skin on your feet and to dry, brittle toenails. Do not apply lotion between your toes.  Trim your toenails straight across. Do not dig under them or around the cuticle. File the edges of your nails with an emery board or nail file.  Do not cut corns or calluses or try to remove them with medicine.  Wear clean socks or stockings every day. Make sure they are not too tight. Do not wear knee-high stockings since they may decrease blood flow to your legs.  Wear shoes that fit properly and have enough cushioning. To break in new shoes, wear them for just a few hours a day. This prevents you from injuring your feet. Always look in your shoes before you put them on to be sure there are no objects inside.  Do not cross your legs. This may decrease the blood flow to your feet.  If you find a minor scrape,  cut, or break in the skin on your feet, keep it and the skin around it clean and dry. These areas may be cleansed with mild soap and water. Do not cleanse the area with peroxide, alcohol, or iodine.  When you remove an adhesive bandage, be sure not to damage the skin around it.  If you have a wound, look at it several times a day to make sure it is healing.  Do not use heating pads or hot water bottles. They may burn your skin. If you have lost feeling in your feet or legs, you may not know it is happening until it is too late.  Make sure your health care provider performs a complete foot exam at least annually or more often if you have foot problems. Report any cuts, sores, or bruises to your health care provider immediately. SEEK MEDICAL CARE IF:   You have an injury that is not healing.  You have cuts or breaks in the skin.  You have an ingrown nail.  You notice redness on your legs or feet.  You feel burning or tingling in your legs or feet.  You have pain or cramps in your legs and feet.  Your legs or feet are numb.  Your feet always feel cold. SEEK IMMEDIATE MEDICAL CARE IF:   There is increasing redness,   swelling, or pain in or around a wound.  There is a red line that goes up your leg.  Pus is coming from a wound.  You develop a fever or as directed by your health care provider.  You notice a bad smell coming from an ulcer or wound. Document Released: 10/11/2000 Document Revised: 06/16/2013 Document Reviewed: 03/23/2013 [employer] Patient Information 2014 [employer].  

## 2013-12-09 NOTE — Progress Notes (Signed)
''   TOENAILS TRIM AND THE RT FOOT 1ST AND 2ND TOENAIL IS A LITTLE SORE.''  HPI: Patient presents today for follow up of foot and nail care. Denies any new complaints today.  Objective: Patients chart is reviewed. Neurovascular status unchanged. Patients nails are thickened, discolored, distrophic, friable and brittle with yellow-brown discoloration. Patient subjectively relates they are painful with shoes and with ambulation of bilateral feet. No sign of infection or irritation at today's visit.  Hyperkeratotic lesions present sub hallux, right; sub 4th metatarsal right, submetatarsal 5 left.  Assessment: Symptomatic onychomycosis  Plan: Discussed treatment options and alternatives. The symptomatic toenails were debrided through manual an mechanical means without complication. Return appointment recommended at routine intervals of 3 months

## 2014-02-24 ENCOUNTER — Ambulatory Visit (INDEPENDENT_AMBULATORY_CARE_PROVIDER_SITE_OTHER): Payer: Medicare PPO | Admitting: Podiatrist

## 2014-02-24 VITALS — Resp 16 | Ht 65.0 in | Wt 166.0 lb

## 2014-02-24 DIAGNOSIS — M79609 Pain in unspecified limb: Secondary | ICD-10-CM

## 2014-02-24 DIAGNOSIS — B351 Tinea unguium: Secondary | ICD-10-CM

## 2014-02-24 NOTE — Progress Notes (Signed)
   HPI:  Patient presents today for follow up of foot and nail care. Denies any new complaints today.  Objective:  Patients chart is reviewed.  Vascular status rev3eals pedal pulses noted at 2 out of 4 dp and pt bilateral .  Neurological sensation is Normal to [employer] monofilament bilateral.  Patients nails are thickened, discolored, distrophic, friable and brittle with yellow-brown discoloration. Patient subjectively relates they are painful with shoes and with ambulation of bilateral feet. She is diabetic however, she does not meet class findings therefore diabetic shoes are not approved by me today.  Assessment:  Symptomatic onychomycosis  Plan:  Discussed treatment options and alternatives.  The symptomatic toenails were debrided through manual an mechanical means without complication.  Return appointment recommended at routine intervals of 3 months    Trudie Buckler, DPM

## 2014-05-26 ENCOUNTER — Ambulatory Visit (INDEPENDENT_AMBULATORY_CARE_PROVIDER_SITE_OTHER): Payer: Medicare PPO | Admitting: Podiatrist

## 2014-05-26 DIAGNOSIS — M79673 Pain in unspecified foot: Secondary | ICD-10-CM

## 2014-05-26 DIAGNOSIS — E1049 Type 1 diabetes mellitus with other diabetic neurological complication: Secondary | ICD-10-CM

## 2014-05-26 DIAGNOSIS — M205X9 Other deformities of toe(s) (acquired), unspecified foot: Secondary | ICD-10-CM

## 2014-05-26 DIAGNOSIS — B351 Tinea unguium: Secondary | ICD-10-CM

## 2014-05-26 DIAGNOSIS — E1149 Type 2 diabetes mellitus with other diabetic neurological complication: Secondary | ICD-10-CM

## 2014-05-26 DIAGNOSIS — M216X9 Other acquired deformities of unspecified foot: Secondary | ICD-10-CM

## 2014-05-26 DIAGNOSIS — M79609 Pain in unspecified limb: Secondary | ICD-10-CM

## 2014-05-26 DIAGNOSIS — Q828 Other specified congenital malformations of skin: Secondary | ICD-10-CM

## 2014-05-26 MED ORDER — AMOXICILLIN-POT CLAVULANATE 875-125 MG PO TABS: 1.0000 | ORAL_TABLET | Freq: Two times a day (BID) | ORAL | Status: DC

## 2014-05-26 NOTE — Progress Notes (Signed)
   Subjective:    Patient ID: [name], female    DOB: Sep 19, 1951, 63 y.o.   MRN: [id number]  HPI Pt presents for nail debridement   Review of Systems     Objective:   Physical Exam Objective: Patients chart is reviewed. Vascular status reveals pedal pulses noted at 2 out of 4 dp and pt bilateral . Neurological sensation is Normal to [employer] monofilament bilateral. Patients nails are thickened, discolored, distrophic, friable and brittle with yellow-brown discoloration. Pinch calluses on bilateral halluces are present . Patient subjectively relates they are painful with shoes and with ambulation of bilateral feet. She is diabetic however, she does not meet class findings     Assessment & Plan:  Assessment: Symptomatic mycotic toenails, calluses x2  Plan: Debrided the callus is in toenails without complication today she'll be seen back in 3 months or as needed for followup.

## 2014-08-12 ENCOUNTER — Encounter (HOSPITAL_COMMUNITY): Payer: Self-pay | Admitting: Emergency Medicine

## 2014-08-12 ENCOUNTER — Emergency Department (HOSPITAL_COMMUNITY)
Admission: EM | Admit: 2014-08-12 | Discharge: 2014-08-12 | Disposition: A | Discharge status: Home / Self Care | Payer: No Typology Code available for payment source | Attending: Emergency Medicine | Admitting: Emergency Medicine

## 2014-08-12 DIAGNOSIS — Y92410 Unspecified street and highway as the place of occurrence of the external cause: Secondary | ICD-10-CM | POA: Insufficient documentation

## 2014-08-12 DIAGNOSIS — I10 Essential (primary) hypertension: Secondary | ICD-10-CM | POA: Diagnosis not present

## 2014-08-12 DIAGNOSIS — S134XXA Sprain of ligaments of cervical spine, initial encounter: Secondary | ICD-10-CM | POA: Insufficient documentation

## 2014-08-12 DIAGNOSIS — J45909 Unspecified asthma, uncomplicated: Secondary | ICD-10-CM | POA: Insufficient documentation

## 2014-08-12 DIAGNOSIS — Y9389 Activity, other specified: Secondary | ICD-10-CM | POA: Insufficient documentation

## 2014-08-12 DIAGNOSIS — F419 Anxiety disorder, unspecified: Secondary | ICD-10-CM | POA: Diagnosis not present

## 2014-08-12 DIAGNOSIS — M1990 Unspecified osteoarthritis, unspecified site: Secondary | ICD-10-CM | POA: Diagnosis not present

## 2014-08-12 DIAGNOSIS — M797 Fibromyalgia: Secondary | ICD-10-CM | POA: Insufficient documentation

## 2014-08-12 DIAGNOSIS — F329 Major depressive disorder, single episode, unspecified: Secondary | ICD-10-CM | POA: Insufficient documentation

## 2014-08-12 DIAGNOSIS — Z794 Long term (current) use of insulin: Secondary | ICD-10-CM | POA: Insufficient documentation

## 2014-08-12 DIAGNOSIS — S139XXA Sprain of joints and ligaments of unspecified parts of neck, initial encounter: Secondary | ICD-10-CM

## 2014-08-12 DIAGNOSIS — Z791 Long term (current) use of non-steroidal anti-inflammatories (NSAID): Secondary | ICD-10-CM | POA: Diagnosis not present

## 2014-08-12 DIAGNOSIS — S3992XA Unspecified injury of lower back, initial encounter: Secondary | ICD-10-CM | POA: Diagnosis present

## 2014-08-12 DIAGNOSIS — K219 Gastro-esophageal reflux disease without esophagitis: Secondary | ICD-10-CM | POA: Insufficient documentation

## 2014-08-12 DIAGNOSIS — Z87891 Personal history of nicotine dependence: Secondary | ICD-10-CM | POA: Diagnosis not present

## 2014-08-12 DIAGNOSIS — E119 Type 2 diabetes mellitus without complications: Secondary | ICD-10-CM | POA: Insufficient documentation

## 2014-08-12 DIAGNOSIS — Z79899 Other long term (current) drug therapy: Secondary | ICD-10-CM | POA: Diagnosis not present

## 2014-08-12 DIAGNOSIS — E785 Hyperlipidemia, unspecified: Secondary | ICD-10-CM | POA: Insufficient documentation

## 2014-08-12 DIAGNOSIS — M545 Low back pain: Secondary | ICD-10-CM

## 2014-08-12 MED ORDER — HYDROCODONE-ACETAMINOPHEN 5-325 MG PO TABS: 1.0000 | ORAL_TABLET | Freq: Four times a day (QID) | ORAL | Status: DC | PRN

## 2014-08-12 MED ORDER — ONDANSETRON HCL 4 MG PO TABS: 4.0000 mg | ORAL_TABLET | Freq: Four times a day (QID) | ORAL | Status: DC
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
## 2014-08-16 NOTE — ED Provider Notes (Signed)
Medical screening examination/treatment/procedure(s) were performed by non-physician practitioner and as supervising physician I was immediately available for consultation/collaboration.   EKG Interpretation None        Francine Graven, DO 08/16/14 [phone]

## 2014-08-18 ENCOUNTER — Ambulatory Visit (INDEPENDENT_AMBULATORY_CARE_PROVIDER_SITE_OTHER): Payer: Medicare PPO | Admitting: Podiatrist

## 2014-08-18 DIAGNOSIS — B351 Tinea unguium: Secondary | ICD-10-CM

## 2014-08-18 DIAGNOSIS — M79676 Pain in unspecified toe(s): Secondary | ICD-10-CM

## 2014-08-19 NOTE — Progress Notes (Signed)
HPI  Pt presents for nail debridement  Review of Systems  Objective:   Physical Exam  Objective: Patients chart is reviewed. Vascular status reveals pedal pulses noted at 2 out of 4 dp and pt bilateral . Neurological sensation is Normal to [employer] monofilament bilateral. Patients nails are thickened, discolored, distrophic, friable and brittle with yellow-brown discoloration. Pinch calluses on bilateral halluces are present . Patient subjectively relates they are painful with shoes and with ambulation of bilateral feet. She is diabetic however, she does not meet class findings  Assessment & Plan:   Assessment: Symptomatic mycotic toenails, calluses x2  Plan: Debrided the callus is in toenails without complication today she'll be seen back in 3 months or as needed for followup.

## 2014-11-10 DIAGNOSIS — R002 Palpitations: Secondary | ICD-10-CM | POA: Diagnosis not present

## 2014-11-10 DIAGNOSIS — R42 Dizziness and giddiness: Secondary | ICD-10-CM | POA: Diagnosis not present

## 2014-11-17 ENCOUNTER — Encounter: Payer: Self-pay | Admitting: Podiatrist

## 2014-11-17 ENCOUNTER — Ambulatory Visit (INDEPENDENT_AMBULATORY_CARE_PROVIDER_SITE_OTHER): Payer: Medicare PPO | Admitting: Podiatrist

## 2014-11-17 DIAGNOSIS — E1140 Type 2 diabetes mellitus with diabetic neuropathy, unspecified: Secondary | ICD-10-CM | POA: Diagnosis not present

## 2014-11-17 DIAGNOSIS — L84 Corns and callosities: Secondary | ICD-10-CM

## 2014-11-17 DIAGNOSIS — M79676 Pain in unspecified toe(s): Secondary | ICD-10-CM

## 2014-11-17 DIAGNOSIS — Q828 Other specified congenital malformations of skin: Secondary | ICD-10-CM

## 2014-11-17 DIAGNOSIS — M216X9 Other acquired deformities of unspecified foot: Secondary | ICD-10-CM

## 2014-11-17 DIAGNOSIS — B351 Tinea unguium: Secondary | ICD-10-CM | POA: Diagnosis not present

## 2014-11-17 NOTE — Patient Instructions (Signed)
Diabetes and Foot Care Diabetes may cause you to have problems because of poor blood supply (circulation) to your feet and legs. This may cause the skin on your feet to become thinner, break easier, and heal more slowly. Your skin may become dry, and the skin may peel and crack. You may also have nerve damage in your legs and feet causing decreased feeling in them. You may not notice minor injuries to your feet that could lead to infections or more serious problems. Taking care of your feet is one of the most important things you can do for yourself.  HOME CARE INSTRUCTIONS  Wear shoes at all times, even in the house. Do not go barefoot. Bare feet are easily injured.  Check your feet daily for blisters, cuts, and redness. If you cannot see the bottom of your feet, use a mirror or ask someone for help.  Wash your feet with warm water (do not use hot water) and mild soap. Then pat your feet and the areas between your toes until they are completely dry. Do not soak your feet as this can dry your skin.  Apply a moisturizing lotion or petroleum jelly (that does not contain alcohol and is unscented) to the skin on your feet and to dry, brittle toenails. Do not apply lotion between your toes.  Trim your toenails straight across. Do not dig under them or around the cuticle. File the edges of your nails with an emery board or nail file.  Do not cut corns or calluses or try to remove them with medicine.  Wear clean socks or stockings every day. Make sure they are not too tight. Do not wear knee-high stockings since they may decrease blood flow to your legs.  Wear shoes that fit properly and have enough cushioning. To break in new shoes, wear them for just a few hours a day. This prevents you from injuring your feet. Always look in your shoes before you put them on to be sure there are no objects inside.  Do not cross your legs. This may decrease the blood flow to your feet.  If you find a minor scrape,  cut, or break in the skin on your feet, keep it and the skin around it clean and dry. These areas may be cleansed with mild soap and water. Do not cleanse the area with peroxide, alcohol, or iodine.  When you remove an adhesive bandage, be sure not to damage the skin around it.  If you have a wound, look at it several times a day to make sure it is healing.  Do not use heating pads or hot water bottles. They may burn your skin. If you have lost feeling in your feet or legs, you may not know it is happening until it is too late.  Make sure your health care provider performs a complete foot exam at least annually or more often if you have foot problems. Report any cuts, sores, or bruises to your health care provider immediately. SEEK MEDICAL CARE IF:   You have an injury that is not healing.  You have cuts or breaks in the skin.  You have an ingrown nail.  You notice redness on your legs or feet.  You feel burning or tingling in your legs or feet.  You have pain or cramps in your legs and feet.  Your legs or feet are numb.  Your feet always feel cold. SEEK IMMEDIATE MEDICAL CARE IF:   There is increasing redness,   swelling, or pain in or around a wound.  There is a red line that goes up your leg.  Pus is coming from a wound.  You develop a fever or as directed by your health care provider.  You notice a bad smell coming from an ulcer or wound. Document Released: 10/11/2000 Document Revised: 06/16/2013 Document Reviewed: 03/23/2013 [employer] Patient Information 2015 [employer]. This information is not intended to replace advice given to you by your health care provider. Make sure you discuss any questions you have with your health care provider.  

## 2014-11-17 NOTE — Progress Notes (Signed)
   HPI:  Patient presents today for follow up of foot and nail care. Denies any new complaints today. Patient is diabetic with subjective reports of neuropathy.  Objective:  Patients chart is reviewed.  Vascular status rev3eals pedal pulses noted at 2 out of 4 dp and pt bilateral .  Neurological sensation is decreased to [employer] monofilament bilateral at 3/5 sites.  Patients nails are thickened, discolored, distrophic, friable and brittle with yellow-brown discoloration 10. Patient subjectively relates they are painful with shoes and with ambulation of bilateral feet. She has multiple calluses on bilateral feet most notably submetatarsal one right, right hallux, and left submetatarsal 5. These are symptomatic and painful with ambulation.  Assessment:  Symptomatic onychomycosis, porokeratotic lesion, prominent in plantar flexed metatarsals diabetes with neuropathy  Plan:  Discussed treatment options and alternatives.  The symptomatic calluses and toenails were debrided through manual an mechanical means without complication.  Return appointment recommended at routine intervals of 3 months

## 2014-12-02 ENCOUNTER — Ambulatory Visit: Payer: Medicare PPO | Admitting: Podiatrist

## 2015-01-31 ENCOUNTER — Other Ambulatory Visit: Payer: Self-pay

## 2015-01-31 DIAGNOSIS — Z1231 Encounter for screening mammogram for malignant neoplasm of breast: Secondary | ICD-10-CM

## 2015-02-09 ENCOUNTER — Ambulatory Visit
Admission: RE | Admit: 2015-02-09 | Discharge: 2015-02-09 | Disposition: A | Discharge status: Home / Self Care | Payer: Medicare PPO | Source: Ambulatory Visit

## 2015-02-09 DIAGNOSIS — Z1231 Encounter for screening mammogram for malignant neoplasm of breast: Secondary | ICD-10-CM

## 2015-02-16 ENCOUNTER — Ambulatory Visit (INDEPENDENT_AMBULATORY_CARE_PROVIDER_SITE_OTHER): Payer: Medicare PPO

## 2015-02-16 DIAGNOSIS — L84 Corns and callosities: Secondary | ICD-10-CM

## 2015-02-16 DIAGNOSIS — Q828 Other specified congenital malformations of skin: Secondary | ICD-10-CM | POA: Diagnosis not present

## 2015-02-16 DIAGNOSIS — B351 Tinea unguium: Secondary | ICD-10-CM

## 2015-02-16 DIAGNOSIS — M79676 Pain in unspecified toe(s): Secondary | ICD-10-CM

## 2015-02-16 NOTE — Progress Notes (Signed)
Presents today chief complaint of painful elongated toenails.  Objective: Pulses are palpable bilateral nails are thick, yellow dystrophic onychomycosis and painful palpation.   Assessment: Onychomycosis with pain in limb.  Plan: Treatment of nails in thickness and length as covered service secondary to pain.  

## 2015-03-28 DIAGNOSIS — H25013 Cortical age-related cataract, bilateral: Secondary | ICD-10-CM | POA: Diagnosis not present

## 2015-03-28 DIAGNOSIS — H2513 Age-related nuclear cataract, bilateral: Secondary | ICD-10-CM | POA: Diagnosis not present

## 2015-03-28 DIAGNOSIS — H43813 Vitreous degeneration, bilateral: Secondary | ICD-10-CM | POA: Diagnosis not present

## 2015-03-28 DIAGNOSIS — E119 Type 2 diabetes mellitus without complications: Secondary | ICD-10-CM | POA: Diagnosis not present

## 2015-04-11 DIAGNOSIS — E1139 Type 2 diabetes mellitus with other diabetic ophthalmic complication: Secondary | ICD-10-CM | POA: Diagnosis not present

## 2015-05-18 ENCOUNTER — Ambulatory Visit (INDEPENDENT_AMBULATORY_CARE_PROVIDER_SITE_OTHER): Payer: Medicare PPO | Admitting: Podiatry

## 2015-05-18 DIAGNOSIS — M79676 Pain in unspecified toe(s): Secondary | ICD-10-CM

## 2015-05-18 DIAGNOSIS — B351 Tinea unguium: Secondary | ICD-10-CM

## 2015-05-18 DIAGNOSIS — L84 Corns and callosities: Secondary | ICD-10-CM

## 2015-05-18 DIAGNOSIS — E1140 Type 2 diabetes mellitus with diabetic neuropathy, unspecified: Secondary | ICD-10-CM

## 2015-05-18 DIAGNOSIS — M2041 Other hammer toe(s) (acquired), right foot: Secondary | ICD-10-CM

## 2015-05-18 DIAGNOSIS — M2042 Other hammer toe(s) (acquired), left foot: Secondary | ICD-10-CM

## 2015-05-18 NOTE — Progress Notes (Signed)
Patient ID: [name], female   DOB: 08-Feb-1951, 64 y.o.   MRN: [id number] Complaint:  Visit Type: Patient returns to my office for continued preventative foot care services. Complaint: Patient states" my nails have grown long and thick and become painful to walk and wear shoes" Patient has been diagnosed with DM with no foot complications. The patient presents for preventative foot care services. No changes to ROS.  Painful corns fifth toes both feet.  Podiatric Exam: Vascular: dorsalis pedis and posterior tibial pulses are palpable bilateral. Capillary return is immediate. Temperature gradient is WNL. Skin turgor WNL  Sensorium: Normal Semmes Weinstein monofilament test. Normal tactile sensation bilaterally. Nail Exam: Pt has thick disfigured discolored nails with subungual debris noted bilateral entire nail hallux through fifth toenails Ulcer Exam: There is no evidence of ulcer or pre-ulcerative changes or infection. Orthopedic Exam: Muscle tone and strength are WNL. No limitations in general ROM. No crepitus or effusions noted. Foot type and digits show no abnormalities. Bony prominences are unremarkable. Skin: No Porokeratosis. No infection or ulcers.  Heloma durum fifth toes B/L.  Diagnosis:  Onychomycosis, , Pain in right toe, pain in left toes, Hammer toes with heloma durum  Treatment & Plan Procedures and Treatment: Consent by patient was obtained for treatment procedures. The patient understood the discussion of treatment and procedures well. All questions were answered thoroughly reviewed. Debridement of mycotic and hypertrophic toenails, 1 through 5 bilateral and clearing of subungual debris. No ulceration, no infection noted. Debride heloma durum  Return Visit-Office Procedure: Patient instructed to return to the office for a follow up visit 3 months for continued evaluation and treatment.

## 2015-06-27 DIAGNOSIS — J301 Allergic rhinitis due to pollen: Secondary | ICD-10-CM | POA: Diagnosis not present

## 2015-07-12 DIAGNOSIS — E1139 Type 2 diabetes mellitus with other diabetic ophthalmic complication: Secondary | ICD-10-CM | POA: Diagnosis not present

## 2015-07-20 DIAGNOSIS — M542 Cervicalgia: Secondary | ICD-10-CM | POA: Diagnosis not present

## 2015-07-20 DIAGNOSIS — M79602 Pain in left arm: Secondary | ICD-10-CM | POA: Diagnosis not present

## 2015-08-29 ENCOUNTER — Encounter: Payer: Self-pay | Admitting: Podiatry

## 2015-08-29 ENCOUNTER — Ambulatory Visit (INDEPENDENT_AMBULATORY_CARE_PROVIDER_SITE_OTHER): Payer: Medicare PPO | Admitting: Podiatry

## 2015-08-29 DIAGNOSIS — M79676 Pain in unspecified toe(s): Secondary | ICD-10-CM

## 2015-08-29 DIAGNOSIS — R52 Pain, unspecified: Secondary | ICD-10-CM

## 2015-08-29 DIAGNOSIS — B351 Tinea unguium: Secondary | ICD-10-CM | POA: Diagnosis not present

## 2015-08-29 DIAGNOSIS — Q828 Other specified congenital malformations of skin: Secondary | ICD-10-CM

## 2015-08-29 DIAGNOSIS — E1140 Type 2 diabetes mellitus with diabetic neuropathy, unspecified: Secondary | ICD-10-CM

## 2015-08-29 NOTE — Progress Notes (Signed)
Patient ID: [name], female   DOB: 1951-01-20, 64 y.o.   MRN: [id number] Complaint:  Visit Type: Patient returns to my office for continued preventative foot care services. Complaint: Patient states" my nails have grown long and thick and become painful to walk and wear shoes" Patient has been diagnosed with DM with no foot complications. The patient presents for preventative foot care services. No changes to ROS.  She states she has burning and pain extending into the inside border right big toe.  Podiatric Exam: Vascular: dorsalis pedis and posterior tibial pulses are palpable bilateral. Capillary return is immediate. Temperature gradient is WNL. Skin turgor WNL  Sensorium: Normal Semmes Weinstein monofilament test. Normal tactile sensation bilaterally. Nail Exam: Pt has thick disfigured discolored nails with subungual debris noted bilateral entire nail hallux through fifth toenails Ulcer Exam: There is no evidence of ulcer or pre-ulcerative changes or infection. Orthopedic Exam: Muscle tone and strength are WNL. No limitations in general ROM. No crepitus or effusions noted. Foot type and digits show no abnormalities. Bony prominences are unremarkable. HAV 1st MPJ B/L. Skin:  Porokeratosis  Forefoot B/L.Marland Kitchen No infection or ulcers.  Heloma durum fifth toes B/L.  Diagnosis:  Onychomycosis, , Pain in right toe, pain in left toes,   Treatment & Plan Procedures and Treatment: Consent by patient was obtained for treatment procedures. The patient understood the discussion of treatment and procedures well. All questions were answered thoroughly reviewed. Debridement of mycotic and hypertrophic toenails, 1 through 5 bilateral and clearing of subungual debris. No ulceration, no infection noted. Initiate diabetic shoe paperwork.  Return Visit-Office Procedure: Patient instructed to return to the office for a follow up visit 3 months for continued evaluation and treatment.

## 2015-09-18 DIAGNOSIS — I129 Hypertensive chronic kidney disease with stage 1 through stage 4 chronic kidney disease, or unspecified chronic kidney disease: Secondary | ICD-10-CM | POA: Diagnosis not present

## 2015-09-18 DIAGNOSIS — Z794 Long term (current) use of insulin: Secondary | ICD-10-CM | POA: Diagnosis not present

## 2015-09-18 DIAGNOSIS — F39 Unspecified mood [affective] disorder: Secondary | ICD-10-CM | POA: Diagnosis not present

## 2015-09-18 DIAGNOSIS — J069 Acute upper respiratory infection, unspecified: Secondary | ICD-10-CM | POA: Diagnosis not present

## 2015-09-18 DIAGNOSIS — M797 Fibromyalgia: Secondary | ICD-10-CM | POA: Diagnosis not present

## 2015-09-18 DIAGNOSIS — J4520 Mild intermittent asthma, uncomplicated: Secondary | ICD-10-CM | POA: Diagnosis not present

## 2015-09-18 DIAGNOSIS — E7800 Pure hypercholesterolemia, unspecified: Secondary | ICD-10-CM | POA: Diagnosis not present

## 2015-09-18 DIAGNOSIS — E1121 Type 2 diabetes mellitus with diabetic nephropathy: Secondary | ICD-10-CM | POA: Diagnosis not present

## 2015-09-18 DIAGNOSIS — N182 Chronic kidney disease, stage 2 (mild): Secondary | ICD-10-CM | POA: Diagnosis not present

## 2015-10-03 DIAGNOSIS — E1139 Type 2 diabetes mellitus with other diabetic ophthalmic complication: Secondary | ICD-10-CM | POA: Diagnosis not present

## 2015-10-10 DIAGNOSIS — M65332 Trigger finger, left middle finger: Secondary | ICD-10-CM | POA: Diagnosis not present

## 2015-10-10 DIAGNOSIS — F5102 Adjustment insomnia: Secondary | ICD-10-CM | POA: Diagnosis not present

## 2015-10-10 DIAGNOSIS — M7032 Other bursitis of elbow, left elbow: Secondary | ICD-10-CM | POA: Diagnosis not present

## 2015-10-10 DIAGNOSIS — M797 Fibromyalgia: Secondary | ICD-10-CM | POA: Diagnosis not present

## 2015-10-11 DIAGNOSIS — Z79899 Other long term (current) drug therapy: Secondary | ICD-10-CM | POA: Diagnosis not present

## 2015-10-13 ENCOUNTER — Ambulatory Visit: Payer: Medicare PPO | Admitting: *Deleted

## 2015-10-13 DIAGNOSIS — E1140 Type 2 diabetes mellitus with diabetic neuropathy, unspecified: Secondary | ICD-10-CM

## 2015-10-13 NOTE — Progress Notes (Signed)
Patient ID: [name], female   DOB: [birth year], 64 y.o.   MRN: [id number] Patient presents to be scanned and measured for diabetic shoes and inserts.

## 2015-11-14 ENCOUNTER — Ambulatory Visit: Payer: Medicare Other | Admitting: *Deleted

## 2015-11-14 DIAGNOSIS — E1140 Type 2 diabetes mellitus with diabetic neuropathy, unspecified: Secondary | ICD-10-CM

## 2015-11-14 NOTE — Progress Notes (Signed)
Patient ID: [name], female   DOB: Mar 12, 1951, 65 y.o.   MRN: [id number]  SHOES NOT RIGHT WILL REORDER

## 2015-11-14 NOTE — Patient Instructions (Signed)

## 2015-11-28 ENCOUNTER — Ambulatory Visit (INDEPENDENT_AMBULATORY_CARE_PROVIDER_SITE_OTHER): Payer: Medicare Other | Admitting: Podiatry

## 2015-11-28 DIAGNOSIS — L84 Corns and callosities: Secondary | ICD-10-CM

## 2015-11-28 DIAGNOSIS — M2042 Other hammer toe(s) (acquired), left foot: Secondary | ICD-10-CM | POA: Diagnosis not present

## 2015-11-28 DIAGNOSIS — E1140 Type 2 diabetes mellitus with diabetic neuropathy, unspecified: Secondary | ICD-10-CM | POA: Diagnosis not present

## 2015-11-28 DIAGNOSIS — M2041 Other hammer toe(s) (acquired), right foot: Secondary | ICD-10-CM

## 2015-11-28 DIAGNOSIS — M216X9 Other acquired deformities of unspecified foot: Secondary | ICD-10-CM

## 2015-11-28 NOTE — Progress Notes (Signed)
Patient ID: [name], female   DOB: 1950/11/23, 65 y.o.   MRN: [id number] Patient presents for diabetic shoe pick up, shoes are tried on for good fit.  Patient received 1 Pair New balance [id number] Grey and 3 pairs custom molded diabetic inserts with 1/4 inch heel lift on the left.  Verbal and written break in and wear instructions given.  Patient will follow up for scheduled routine care.    Podiatric Exam: Vascular: dorsalis pedis and posterior tibial pulses are palpable bilateral. Capillary return is immediate. Temperature gradient is WNL. Skin turgor WNL  Sensorium: Normal Semmes Weinstein monofilament test. Normal tactile sensation bilaterally. Nail Exam: Pt has thick disfigured discolored nails with subungual debris noted bilateral entire nail hallux through fifth toenails Ulcer Exam: There is no evidence of ulcer or pre-ulcerative changes or infection. Orthopedic Exam: Muscle tone and strength are WNL. No limitations in general ROM. No crepitus or effusions noted. Foot type and digits show no abnormalities. Bony prominences are unremarkable. HAV 1st MPJ B/L. Skin: Porokeratosis Forefoot B/L.Marland Kitchen No infection or ulcers. Heloma durum fifth toes B/L.   Diagnosis  Diabetic Neuropathy   HAV B/L  Hammer toes B/L,  Plantarflexed Metatarsal   Dispense shoes Patient presents today and was dispensed 0ne pair ( two units) of medically necessary extra depth shoes with three pair( six units) of custom molded multiple density inserts. The shoes and the inserts are fitted to the patients ' feet and are noted to fit well and are free of defect.  Length and width of the shoes are also acceptable.  Patient was given written and verbal  instructions for wearing.  If any concerns arrive with the shoes or inserts, the patient is to call the office.Patient is to follow up with doctor in six weeks.   Gardiner Barefoot DPM

## 2015-11-28 NOTE — Patient Instructions (Signed)

## 2015-12-05 ENCOUNTER — Ambulatory Visit: Payer: Medicare PPO | Admitting: Podiatry

## 2015-12-08 ENCOUNTER — Encounter: Payer: Self-pay | Admitting: Podiatry

## 2015-12-08 ENCOUNTER — Ambulatory Visit (INDEPENDENT_AMBULATORY_CARE_PROVIDER_SITE_OTHER): Payer: Medicare Other | Admitting: Podiatry

## 2015-12-08 DIAGNOSIS — E1140 Type 2 diabetes mellitus with diabetic neuropathy, unspecified: Secondary | ICD-10-CM

## 2015-12-08 DIAGNOSIS — M79676 Pain in unspecified toe(s): Secondary | ICD-10-CM | POA: Diagnosis not present

## 2015-12-08 DIAGNOSIS — B351 Tinea unguium: Secondary | ICD-10-CM

## 2015-12-08 NOTE — Progress Notes (Signed)
Patient ID: [name], female   DOB: [birth year], 65 y.o.   MRN: [id number] Complaint:  Visit Type: Patient returns to my office for continued preventative foot care services. Complaint: Patient states" my nails have grown long and thick and become painful to walk and wear shoes" Patient has been diagnosed with DM with no foot complications. The patient presents for preventative foot care services. No changes to ROS.  She states she has burning and pain extending into the inside border right big toe.  Podiatric Exam: Vascular: dorsalis pedis and posterior tibial pulses are palpable bilateral. Capillary return is immediate. Temperature gradient is WNL. Skin turgor WNL  Sensorium: Normal Semmes Weinstein monofilament test. Normal tactile sensation bilaterally. Nail Exam: Pt has thick disfigured discolored nails with subungual debris noted bilateral entire nail hallux through fifth toenails Ulcer Exam: There is no evidence of ulcer or pre-ulcerative changes or infection. Orthopedic Exam: Muscle tone and strength are WNL. No limitations in general ROM. No crepitus or effusions noted. Foot type and digits show no abnormalities. Bony prominences are unremarkable. HAV 1st MPJ B/L. Skin:  .. No infection or ulcers.  Heloma durum fifth toes B/L.  Diagnosis:  Onychomycosis, , Pain in right toe, pain in left toes,   Treatment & Plan Procedures and Treatment: Consent by patient was obtained for treatment procedures. The patient understood the discussion of treatment and procedures well. All questions were answered thoroughly reviewed. Debridement of mycotic and hypertrophic toenails, 1 through 5 bilateral and clearing of subungual debris. No ulceration, no infection noted. Discussed nail surgery left hallux. Return Visit-Office Procedure: Patient instructed to return to the office for a follow up visit 3 months for continued evaluation and treatment.   Gardiner Barefoot DPM

## 2016-03-12 ENCOUNTER — Ambulatory Visit (INDEPENDENT_AMBULATORY_CARE_PROVIDER_SITE_OTHER): Payer: Medicare Other | Admitting: Podiatry

## 2016-03-12 ENCOUNTER — Encounter: Payer: Self-pay | Admitting: Podiatry

## 2016-03-12 DIAGNOSIS — E1140 Type 2 diabetes mellitus with diabetic neuropathy, unspecified: Secondary | ICD-10-CM

## 2016-03-12 DIAGNOSIS — M79676 Pain in unspecified toe(s): Secondary | ICD-10-CM

## 2016-03-12 DIAGNOSIS — B351 Tinea unguium: Secondary | ICD-10-CM

## 2016-03-12 NOTE — Progress Notes (Signed)
Patient ID: [name], female   DOB: 1951/06/14, 65 y.o.   MRN: [id number] Complaint:  Visit Type: Patient returns to my office for continued preventative foot care services. Complaint: Patient states" my nails have grown long and thick and become painful to walk and wear shoes" Patient has been diagnosed with DM with no foot complications. The patient presents for preventative foot care services. No changes to ROS.    Podiatric Exam: Vascular: dorsalis pedis and posterior tibial pulses are palpable bilateral. Capillary return is immediate. Temperature gradient is WNL. Skin turgor WNL  Sensorium: Normal Semmes Weinstein monofilament test. Normal tactile sensation bilaterally. Nail Exam: Pt has thick disfigured discolored nails with subungual debris noted bilateral entire nail hallux through fifth toenails Ulcer Exam: There is no evidence of ulcer or pre-ulcerative changes or infection. Orthopedic Exam: Muscle tone and strength are WNL. No limitations in general ROM. No crepitus or effusions noted. Foot type and digits show no abnormalities. Bony prominences are unremarkable. HAV 1st MPJ B/L. Skin:  Porokeratosis  Forefoot B/L.Marland Kitchen No infection or ulcers.  Heloma durum fifth toes B/L.  Diagnosis:  Onychomycosis, , Pain in right toe, pain in left toes,   Treatment & Plan Procedures and Treatment: Consent by patient was obtained for treatment procedures. The patient understood the discussion of treatment and procedures well. All questions were answered thoroughly reviewed. Debridement of mycotic and hypertrophic toenails, 1 through 5 bilateral and clearing of subungual debris. No ulceration, no infection noted.   Return Visit-Office Procedure: Patient instructed to return to the office for a follow up visit 3 months for continued evaluation and treatment.   Gardiner Barefoot DPM

## 2016-10-04 NOTE — Progress Notes (Signed)
Office Visit Note  Patient: [name]             Date of Birth: [birth year]           MRN: [id number]             PCP: Osborne Casco, MD Referring: [name], MD Visit Date: 10/07/2016 Occupation: @GUAROCC @    Subjective:  No chief complaint on file. Follow-up on fibromyalgia, fatigue, insomnia, DDD of the C and L-spine, OA of the hands and knee joint and osteopenia.  History of Present Illness: [name] is a 65 y.o. female  Last seen 04/04/2016.  Note that the last report we have her bone density is as follows: Dexa done Dec 10, 2011. Ordered by Dr. Lady Deutscher. She discussed Dexa w/ pt on December 31, 2011. Shows Osteopenia and advised Vit D3 and Calcium.  Patient has an appointment to see Dr. Laurann Montana today. I've asked the patient to discuss with Dr. Laurann Montana regarding ordering the bone density. She is due for one now if she hasn't had one done in 2015.  Patient's fibromyalgia discomfort is rated 5-6 on a scale of 0-10. Patient's insomnia is rated about 5. Patient is doing well because she is controlling her stress well according to the patient. No change in her symptoms overall since the last visit. Other than improvement of her fibromyalgia. She is using Restoril but she is only needing 15 mg every night when necessary versus at 30 mg and she is doing well with this new dose.  She has trouble with her left shoulder joint has had rotator cuff repair. Ever since then she's been having some lymphadenopathy. She seen a vascular surgeon who told her to keep her hands raised above her head to minimize the swelling she is getting from lymph node with poor drainage. Patient states that she cannot walk around like that and it is very uncomfortable to keep her hands raised like that. She wants to know what she can do regarding this and I've asked the patient to follow with her PCP who may be able to refer her to another vascular surgeon who can give her different  advice perhaps. She recently had labs done at [facility] on October 2017.  The vitamin D is low and I've asked the patient to take vitamin D3 5000 international units Friday, Saturday, Sunday 3 months and then once a week thereafter  her uric acid is slightly elevated at 7.3 but she does not have evidence of gout at this time. We will watch her carefully and give her uric acid lowering medication if she has symptoms of gout.  Activities of Daily Living:  Patient reports morning stiffness for minutes.   Patient Denies nocturnal pain.  Difficulty dressing/grooming: Denies Difficulty climbing stairs: Denies Difficulty getting out of chair: Denies Difficulty using hands for taps, buttons, cutlery, and/or writing: Denies   Review of Systems  Constitutional: Positive for fatigue.  HENT: Negative for mouth sores and mouth dryness.   Eyes: Negative for dryness.  Respiratory: Negative for shortness of breath.   Gastrointestinal: Negative for constipation and diarrhea.  Musculoskeletal: Positive for myalgias and myalgias.  Skin: Negative for sensitivity to sunlight.  Psychiatric/Behavioral: Positive for sleep disturbance. Negative for decreased concentration.    PMFS History:  There are no active problems to display for this patient.   Past Medical History:  Diagnosis Date  . Anxiety   . Arthritis   . Asthma   . Depression   .  Diabetes mellitus without complication ([place])   . Fibromyalgia   . GERD (gastroesophageal reflux disease)   . Hyperlipemia   . Hypertension   . PONV (postoperative nausea and vomiting)     Family History  Problem Relation Age of Onset  . Diabetes Mother   . Heart attack Father   . Diabetes Sister   . Throat cancer Brother    Past Surgical History:  Procedure Laterality Date  . COLONOSCOPY    . DILATION AND CURETTAGE OF UTERUS    . KNEE ARTHROSCOPY  [id number]   left  . SHOULDER ARTHROSCOPY     left  . TRIGGER FINGER RELEASE  08/13/2012    Procedure: RELEASE TRIGGER FINGER/A-1 PULLEY;  Surgeon: [name]., MD;  Location: [place];  Service: Orthopedics;  Laterality: Right;  long finger  . TUBAL LIGATION     Social History   Social History Narrative  . No narrative on file     Objective: Vital Signs: BP 124/73 (BP Location: Left Arm, Patient Position: Sitting, Cuff Size: Large)   Pulse 72   Resp 14   Ht 5\' 6"  (1.676 m)   Wt 160 lb (72.6 kg)   BMI 25.82 kg/m    Physical Exam  Constitutional: She is oriented to person, place, and time. She appears well-developed and well-nourished.  HENT:  Head: Normocephalic and atraumatic.  Eyes: EOM are normal. Pupils are equal, round, and reactive to light.  Cardiovascular: Normal rate, regular rhythm and normal heart sounds.  Exam reveals no gallop and no friction rub.   No murmur heard. Pulmonary/Chest: Effort normal and breath sounds normal. She has no wheezes. She has no rales.  Abdominal: Soft. Bowel sounds are normal. She exhibits no distension. There is no tenderness. There is no guarding. No hernia.  Musculoskeletal: Normal range of motion. She exhibits no edema, tenderness or deformity.  Lymphadenopathy:    She has no cervical adenopathy.  Neurological: She is alert and oriented to person, place, and time. Coordination normal.  Skin: Skin is warm and dry. Capillary refill takes less than 2 seconds. No rash noted.  Psychiatric: She has a normal mood and affect. Her behavior is normal.  Nursing note and vitals reviewed.    Musculoskeletal Exam:  Full range of motion of all joints except 90 of abduction of bilateral shoulder joint Grip strength is equal and strong bilaterally Fiber myalgia tender points are 18 out of 18 positive  CDAI Exam: No CDAI exam completed.  No synovitis on examination  Investigation: No additional findings. Labs done at [facility] approximately October 2017 that contained uric acid levels of 7.3 and vitamin D of 26  were reviewed with the patient. I've asked her to take over-the-counter vitamin D3. See history of present illness for full details  Imaging: No results found.  Speciality Comments: No specialty comments available.    Procedures:  No procedures performed Allergies: Asa [aspirin]; Benadryl [diphenhydramine]; Darvon [propoxyphene]; Percocet [oxycodone-acetaminophen]; Sulfa antibiotics; Sulfamethoxazole; Talwin [pentazocine]; Tetracyclines & related; and Tetracycline   Assessment / Plan:     Visit Diagnoses: Fibromyalgia  Chronic fatigue  Insomnia due to medical condition  DJD (degenerative joint disease), cervical  Spondylosis of lumbar region without myelopathy or radiculopathy  Primary osteoarthritis of both knees   Vitamin D deficiency. Advised patient to take vitamin D3 5000 IUs Friday Saturday Sunday 3 months then weekly thereafter. When patient returns to clinic in 5 months I will recheck her CBC with differential CMP with GFR  uric acid vitamin D3. Labs will be drawn in office Refill her medications. Patient should avoid metaxalone during the daytime. Only use it at night Patient should avoid Voltaren gel and meloxicam at the same time she should use one or the other but not both.  Orders: No orders of the defined types were placed in this encounter.  Meds ordered this encounter  Medications  . metaxalone (SKELAXIN) 800 MG tablet    Sig: Take 1 tablet (800 mg total) by mouth as needed.    Dispense:  30 tablet    Refill:  2    Order Specific Question:   Supervising Provider    Answer:   Bo Merino [[id number]]  . temazepam (RESTORIL) 15 MG capsule    Sig: Take 2 capsules (30 mg total) by mouth at bedtime as needed.    Dispense:  90 capsule    Refill:  1    Order Specific Question:   Supervising Provider    Answer:   Bo Merino [[id number]]  . meloxicam (MOBIC) 7.5 MG tablet    Sig: Take 1 tablet (7.5 mg total) by mouth daily.    Dispense:  30 tablet     Refill:  5    Order Specific Question:   Supervising Provider    Answer:   Bo Merino [[id number]]  . DISCONTD: diclofenac sodium (VOLTAREN) 1 % GEL    Sig: Apply 4 g topically 4 (four) times daily. Voltaren Gel 3 grams to 3 large joints upto TID 3 TUBES with 3 refills    Dispense:  3 Tube    Refill:  3    Voltaren Gel 3 grams to 3 large joints upto TID 3 TUBES with 3 refills    Order Specific Question:   Supervising Provider    Answer:   Bo Merino [[id number]]  . diclofenac sodium (VOLTAREN) 1 % GEL    Sig: Voltaren Gel 3 grams to 3 large joints upto TID 3 TUBES with 3 refills    Dispense:  3 Tube    Refill:  3    Voltaren Gel 3 grams to 3 large joints upto TID 3 TUBES with 3 refills    Order Specific Question:   Supervising Provider    Answer:   Lyda Perone    Face-to-face time spent with patient was 30 minutes. 50% of time was spent in counseling and coordination of care.  Follow-Up Instructions: Return in about 5 months (around 03/07/2017) for fms,fatigue,insom,oa hands & knees, [place].   Eliezer Lofts, PA-C

## 2016-10-07 ENCOUNTER — Encounter: Payer: Self-pay | Admitting: Rheumatology

## 2016-10-07 ENCOUNTER — Ambulatory Visit (INDEPENDENT_AMBULATORY_CARE_PROVIDER_SITE_OTHER): Payer: Medicare Other | Admitting: Rheumatology

## 2016-10-07 VITALS — BP 124/73 | HR 72 | Resp 14 | Ht 66.0 in | Wt 160.0 lb

## 2016-10-07 DIAGNOSIS — G4701 Insomnia due to medical condition: Secondary | ICD-10-CM | POA: Diagnosis not present

## 2016-10-07 DIAGNOSIS — E790 Hyperuricemia without signs of inflammatory arthritis and tophaceous disease: Secondary | ICD-10-CM | POA: Diagnosis not present

## 2016-10-07 DIAGNOSIS — M170 Bilateral primary osteoarthritis of knee: Secondary | ICD-10-CM

## 2016-10-07 DIAGNOSIS — R5382 Chronic fatigue, unspecified: Secondary | ICD-10-CM

## 2016-10-07 DIAGNOSIS — M47812 Spondylosis without myelopathy or radiculopathy, cervical region: Secondary | ICD-10-CM

## 2016-10-07 DIAGNOSIS — M5030 Other cervical disc degeneration, unspecified cervical region: Secondary | ICD-10-CM | POA: Diagnosis not present

## 2016-10-07 DIAGNOSIS — M47816 Spondylosis without myelopathy or radiculopathy, lumbar region: Secondary | ICD-10-CM | POA: Diagnosis not present

## 2016-10-07 DIAGNOSIS — E559 Vitamin D deficiency, unspecified: Secondary | ICD-10-CM

## 2016-10-07 DIAGNOSIS — M797 Fibromyalgia: Secondary | ICD-10-CM

## 2016-10-07 MED ORDER — TEMAZEPAM 15 MG PO CAPS: 30.0000 mg | ORAL_CAPSULE | Freq: Every evening | ORAL | 1 refills | Status: DC | PRN

## 2016-10-07 MED ORDER — MELOXICAM 7.5 MG PO TABS: 7.5000 mg | ORAL_TABLET | Freq: Every day | ORAL | 5 refills | Status: DC

## 2016-10-07 MED ORDER — METAXALONE 800 MG PO TABS: 800.0000 mg | ORAL_TABLET | ORAL | 2 refills | Status: AC | PRN

## 2016-10-07 MED ORDER — DICLOFENAC SODIUM 1 % TD GEL: TRANSDERMAL | 3 refills | Status: DC

## 2016-10-07 MED ORDER — DICLOFENAC SODIUM 1 % TD GEL
4.0000 g | Freq: Four times a day (QID) | TRANSDERMAL | 3 refills | Status: DC
Start: 2016-10-07 — End: 2016-10-07

## 2016-10-16 ENCOUNTER — Other Ambulatory Visit: Payer: Self-pay | Admitting: Rheumatology

## 2016-10-16 NOTE — Telephone Encounter (Signed)
Please send Temazepam, Melocam and Voltaren gel to [facility] on [address] instead of [address].

## 2016-10-18 ENCOUNTER — Other Ambulatory Visit: Payer: Self-pay | Admitting: *Deleted

## 2016-10-18 MED ORDER — DICLOFENAC SODIUM 1 % TD GEL: TRANSDERMAL | 3 refills | Status: DC

## 2016-10-18 MED ORDER — TEMAZEPAM 15 MG PO CAPS: 30.0000 mg | ORAL_CAPSULE | Freq: Every evening | ORAL | 1 refills | Status: DC | PRN

## 2016-10-18 MED ORDER — MELOXICAM 7.5 MG PO TABS: 7.5000 mg | ORAL_TABLET | Freq: Every day | ORAL | 5 refills | Status: DC

## 2016-10-18 NOTE — Telephone Encounter (Signed)
Prescriptions have been sent to the pharmacy. 

## 2016-12-26 ENCOUNTER — Telehealth: Payer: Self-pay | Admitting: Pharmacist

## 2016-12-26 NOTE — Telephone Encounter (Signed)
Called patient to advise.  Left message

## 2016-12-26 NOTE — Telephone Encounter (Signed)
Received letter from patient's insurance regarding drug-disease interaction (renal disease and NSAIDs).  Patient is currently prescribed meloxicam 7.5 mg PO daily and topical diclofenac gel as needed.  You have advised patient to avoid Voltaren gel and meloxicam at the same time.     Most recent renal function shows Cr. 1.11 and GFR 59 on 03/05/16.  You have CMP pending for patient's follow up visit on 03/07/17.  Please advise.

## 2016-12-26 NOTE — Telephone Encounter (Signed)
Patient should not be using meloxicam at the same time she uses diclofenac gel. If she is having systemic pain, meloxicam as appropriateIf she's not having systemic pain and it's only localized pain then she would be better off taking the Voltaren gel (i.e. diclofenac gel)

## 2016-12-27 NOTE — Telephone Encounter (Signed)
Received phone call from patient.  I reviewed the letter I received from her insurance company.  Patient reports she is not using the Voltaren gel and she only takes meloxicam if needed.  She reports she only takes 3 to 4 doses per month.  Patient denies any questions or concerns regarding her medications at this time.    Elisabeth Most, Pharm.D., BCPS, CPP Clinical Pharmacist Pager: [phone] Phone: [phone] 12/27/2016 4:20 PM

## 2016-12-27 NOTE — Telephone Encounter (Signed)
Second attempt to reach patient with success.

## 2016-12-31 ENCOUNTER — Ambulatory Visit: Payer: Medicare Other | Admitting: Podiatry

## 2017-01-03 ENCOUNTER — Telehealth: Payer: Self-pay | Admitting: Rheumatology

## 2017-01-03 NOTE — Telephone Encounter (Signed)
Okay to prescribe TENS unit for patient She has history of degenerative disc disease of the C-spine and L-spine.She also has a history of OA of the hands and knee joint.The TENS unit can be helpful for her neck pain. Patient will need to let us know if she needs for leads were to leads so we can specify what to order for her in terms of the TENS unit.She can also call her insurance company and find out which specific 1 that they approve so we can order that one specifically

## 2017-01-03 NOTE — Telephone Encounter (Signed)
Patient called requesting a tens unit.  She believe that her insurance will pay for it if it prescribe by the doctor.  FX#[phone].  Thank you

## 2017-01-07 NOTE — Telephone Encounter (Signed)
Patient to contact insurance company to find out which TENS unit is covered and will contact the office with information. Will write order for that one once she calls with the information.

## 2017-01-08 ENCOUNTER — Other Ambulatory Visit: Payer: Self-pay | Admitting: *Deleted

## 2017-01-08 DIAGNOSIS — M5030 Other cervical disc degeneration, unspecified cervical region: Secondary | ICD-10-CM

## 2017-01-08 DIAGNOSIS — M5136 Other intervertebral disc degeneration, lumbar region: Secondary | ICD-10-CM

## 2017-01-14 ENCOUNTER — Telehealth: Payer: Self-pay | Admitting: Rheumatology

## 2017-01-14 NOTE — Telephone Encounter (Signed)
Patient would like to know if the office has any recommendations on where she can get a tens unit (she has an rx for one that was provided by Dr. Estanislado Pandy). Her insurance will cover it but she is having a hard time finding somewhere to get one. Please call patient with any info.

## 2017-01-14 NOTE — Telephone Encounter (Signed)
I asked her to try [facility], but they could not help her. I suggested she try her insurance company to see if they have any suggestions.

## 2017-02-11 ENCOUNTER — Telehealth: Payer: Self-pay | Admitting: *Deleted

## 2017-02-11 NOTE — Telephone Encounter (Signed)
Patient advised we do not have a waiver or medical necessity  paperwork in the office to fill out for her TENS unit. Patient is going to contact the person who said the were in need of it and have them fax it to our office.

## 2017-02-11 NOTE — Telephone Encounter (Signed)
-----   Message from Twanna Hy sent at 02/07/2017  2:44 PM EDT ----- Regarding: tens unit Contact: [phone] Please return call regarding a Waiver signed or medical necessary paper work so the ins can be filed for the [employer]. Other ph# [phone]   Thx  Tisha

## 2017-02-11 NOTE — Telephone Encounter (Signed)
Attempted to contact the patient and left message for patient to call the office.  

## 2017-02-21 ENCOUNTER — Telehealth (INDEPENDENT_AMBULATORY_CARE_PROVIDER_SITE_OTHER): Payer: Self-pay | Admitting: Rheumatology

## 2017-02-21 NOTE — Telephone Encounter (Signed)
LAST OV NOTE PRINTED FOR [employer] TO SUPPORT NEED FOR TENS UNIT ORDERED BY DR Estanislado Pandy

## 2017-03-06 ENCOUNTER — Encounter: Payer: Self-pay | Admitting: Rheumatology

## 2017-03-06 ENCOUNTER — Ambulatory Visit (INDEPENDENT_AMBULATORY_CARE_PROVIDER_SITE_OTHER): Payer: Medicare Other | Admitting: Rheumatology

## 2017-03-06 DIAGNOSIS — G4701 Insomnia due to medical condition: Secondary | ICD-10-CM

## 2017-03-06 DIAGNOSIS — M797 Fibromyalgia: Secondary | ICD-10-CM

## 2017-03-06 DIAGNOSIS — E790 Hyperuricemia without signs of inflammatory arthritis and tophaceous disease: Secondary | ICD-10-CM

## 2017-03-06 DIAGNOSIS — R5382 Chronic fatigue, unspecified: Secondary | ICD-10-CM | POA: Diagnosis not present

## 2017-03-06 DIAGNOSIS — E559 Vitamin D deficiency, unspecified: Secondary | ICD-10-CM | POA: Diagnosis not present

## 2017-03-06 LAB — CBC WITH DIFFERENTIAL/PLATELET
Basophils Absolute: 58 cells/uL (ref 0–200)
Basophils Relative: 1 %
Eosinophils Absolute: 58 cells/uL (ref 15–500)
Eosinophils Relative: 1 %
HCT: 36 % (ref 35.0–45.0)
Hemoglobin: 11.7 g/dL (ref 11.7–15.5)
Lymphocytes Relative: 46 %
Lymphs Abs: 2668 cells/uL (ref 850–3900)
MCH: 26.5 pg — ABNORMAL LOW (ref 27.0–33.0)
MCHC: 32.5 g/dL (ref 32.0–36.0)
MCV: 81.4 fL (ref 80.0–100.0)
MPV: 10.2 fL (ref 7.5–12.5)
Monocytes Absolute: 406 cells/uL (ref 200–950)
Monocytes Relative: 7 %
Neutro Abs: 2610 cells/uL (ref 1500–7800)
Neutrophils Relative %: 45 %
Platelets: 310 10*3/uL (ref 140–400)
RBC: 4.42 MIL/uL (ref 3.80–5.10)
RDW: 14.4 % (ref 11.0–15.0)
WBC: 5.8 10*3/uL (ref 3.8–10.8)

## 2017-03-06 MED ORDER — BACLOFEN 10 MG PO TABS: 10.0000 mg | ORAL_TABLET | Freq: Two times a day (BID) | ORAL | 1 refills | Status: DC

## 2017-03-06 MED ORDER — DICLOFENAC SODIUM 1 % TD GEL: TRANSDERMAL | 3 refills | Status: DC

## 2017-03-06 NOTE — Progress Notes (Signed)
Office Visit Note  Patient: [name]             Date of Birth: [birth year]           MRN: [id number]             PCP: [name], MD Referring: [name], MD Visit Date: 03/06/2017 Occupation: _0 @    Subjective:  Pain of the Lower Back and Pain of the Left Hip   History of Present Illness: [name] is a 66 y.o. female  Last seen 10/07/2016.  Patient's fibromyalgia is described as 2 on a scale of 0-10. She is doing well overall except she is having significant amount of left hip pain/left leg pain. This is been going on now for about 2 weeks. She has not fallen or injured herself. She hasn't experienced this in the past. The pain is also affecting her lower back. She is not able to walk up or down the stairs as she normally would. Currently, she has to take 1 step at a time and she can only push off on her good leg redness his right leg).   Patient sees Dr. [name] who manages her bone density. According to her, her last bone density was done 12/10/2011 and she is due for repeat bone density now. She will get [name] a copy of the bone density when she has it done.  Activities of Daily Living:  Patient reports morning stiffness for 15 minutes.   Patient Reports nocturnal pain.  Difficulty dressing/grooming: Reports Difficulty climbing stairs: Reports Difficulty getting out of chair: Reports Difficulty using hands for taps, buttons, cutlery, and/or writing: Reports   Review of Systems  Constitutional: Positive for fatigue.  HENT: Negative for mouth sores and mouth dryness.   Eyes: Negative for dryness.  Respiratory: Negative for shortness of breath.   Gastrointestinal: Negative for constipation and diarrhea.  Musculoskeletal: Positive for myalgias and myalgias.  Skin: Negative for sensitivity to sunlight.  Psychiatric/Behavioral: Positive for sleep disturbance. Negative for decreased concentration.    PMFS History:  There are no  active problems to display for this patient.   Past Medical History:  Diagnosis Date  . Anxiety   . Arthritis   . Asthma   . Depression   . Diabetes mellitus without complication ([place])   . Fibromyalgia   . GERD (gastroesophageal reflux disease)   . Hyperlipemia   . Hypertension   . PONV (postoperative nausea and vomiting)     Family History  Problem Relation Age of Onset  . Diabetes Mother   . Heart attack Father   . Diabetes Sister   . Throat cancer Brother    Past Surgical History:  Procedure Laterality Date  . COLONOSCOPY    . DILATION AND CURETTAGE OF UTERUS    . KNEE ARTHROSCOPY  [id number]   left  . SHOULDER ARTHROSCOPY     left  . TRIGGER FINGER RELEASE  08/13/2012   Procedure: RELEASE TRIGGER FINGER/A-1 PULLEY;  Surgeon: [name]., MD;  Location: [facility];  Service: Orthopedics;  Laterality: Right;  long finger  . TUBAL LIGATION     Social History   Social History Narrative  . No narrative on file     Objective: Vital Signs: BP 138/78   Pulse 80   Resp 16   Ht 5' 5.5" (1.664 m)   Wt 154 lb (69.9 kg)   BMI 25.24 kg/m    Physical Exam  Constitutional: She  is oriented to person, place, and time. She appears well-developed and well-nourished.  HENT:  Head: Normocephalic and atraumatic.  Eyes: EOM are normal. Pupils are equal, round, and reactive to light.  Cardiovascular: Normal rate, regular rhythm and normal heart sounds.  Exam reveals no gallop and no friction rub.   No murmur heard. Pulmonary/Chest: Effort normal and breath sounds normal. She has no wheezes. She has no rales.  Abdominal: Soft. Bowel sounds are normal. She exhibits no distension. There is no tenderness. There is no guarding. No hernia.  Musculoskeletal: Normal range of motion. She exhibits no edema, tenderness or deformity.  Lymphadenopathy:    She has no cervical adenopathy.  Neurological: She is alert and oriented to person, place, and time. Coordination  normal.  Skin: Skin is warm and dry. Capillary refill takes less than 2 seconds. No rash noted.  Psychiatric: She has a normal mood and affect. Her behavior is normal.  Nursing note and vitals reviewed.    Musculoskeletal Exam:  Full range of motion of all joints except decreased range of motion of left hip secondary to pain in the lateral aspect of the left hip at the greater trochanter bursa. Grip strength is equal and strong bilaterally Fiber myalgia tender points are 18 out of 18 positive  CDAI Exam: CDAI Homunculus Exam:   Joint Counts:  CDAI Tender Joint count: 0 CDAI Swollen Joint count: 0  Global Assessments:  Patient Global Assessment: 8 Provider Global Assessment: 8  CDAI Calculated Score: 16    Investigation: No additional findings. No visits with results within 6 Month(s) from this visit.  Latest known visit with results is:  Admission on 08/13/2012, Discharged on 08/13/2012  Component Date Value Ref Range Status  . Sodium 08/11/2012 139  135 - 145 mEq/L Final  . Potassium 08/11/2012 4.8  3.5 - 5.1 mEq/L Final  . Chloride 08/11/2012 107  96 - 112 mEq/L Final  . CO2 08/11/2012 27  19 - 32 mEq/L Final  . Glucose, Bld 08/11/2012 90  70 - 99 mg/dL Final  . BUN 08/11/2012 16  6 - 23 mg/dL Final  . Creatinine, Ser 08/11/2012 0.89  0.50 - 1.10 mg/dL Final  . Calcium 08/11/2012 9.2  8.4 - 10.5 mg/dL Final  . GFR calc non Af Amer 08/11/2012 68* >90 mL/min Final  . GFR calc Af Amer 08/11/2012 78* >90 mL/min Final   Comment:                                 The eGFR has been calculated                          using the CKD EPI equation.                          This calculation has not been                          validated in all clinical                          situations.                          eGFR's persistently                          <  90 mL/min signify                          possible Chronic Kidney Disease.  . Glucose-Capillary 08/13/2012 106* 70 - 99  mg/dL Final  . Glucose-Capillary 08/13/2012 81  70 - 99 mg/dL Final  . Hemoglobin 08/13/2012 12.4  12.0 - 15.0 g/dL Final     Imaging: No results found.  Speciality Comments: No specialty comments available.    Procedures:  No procedures performed Allergies: Asa [aspirin]; Benadryl [diphenhydramine]; Darvon [propoxyphene]; Percocet [oxycodone-acetaminophen]; Sulfa antibiotics; Sulfamethoxazole; Talwin [pentazocine]; Tetracyclines & related; and Tetracycline   Assessment / Plan:     Visit Diagnoses: Fibromyalgia - Plan: CBC with Differential/Platelet, COMPLETE METABOLIC PANEL WITH GFR, Uric acid, VITAMIN D 25 Hydroxy (Vit-D Deficiency, Fractures)  Chronic fatigue - Plan: CBC with Differential/Platelet, COMPLETE METABOLIC PANEL WITH GFR, Uric acid, VITAMIN D 25 Hydroxy (Vit-D Deficiency, Fractures)  Insomnia due to medical condition - Plan: CBC with Differential/Platelet, COMPLETE METABOLIC PANEL WITH GFR, Uric acid, VITAMIN D 25 Hydroxy (Vit-D Deficiency, Fractures)  Vitamin D deficiency - Plan: CBC with Differential/Platelet, COMPLETE METABOLIC PANEL WITH GFR, Uric acid, VITAMIN D 25 Hydroxy (Vit-D Deficiency, Fractures)  Uricacidemia - Plan: Uric acid   Plan: #1: Fibromyalgia. Active disease with denies pain and 18 out of 18 tender points.  #2: Patient's labs are pasty. We will do CBC with differential, CMP with GFR, vitamin D, uric acid today.  #3: Left greater trochanter bursa pain. I demonstrated to the patient proper IT band exercises. I offered her a cortisone injection but she is a diabetic and she declined cortisone at this time stating "my sugar will run up and I don't want to do that".  #4: Voltaren gel. Patient will use Voltaren gel when the left greater trochanter bursa is painful. If she has systemic pain, she is using meloxicam. She will not use both at the same time.  #5: At the last visit in December 2017, she had low vitamin D and she had slight elevation in  her uric acid. We would like to update those 2 values on today's labs to better monitor the patient.  #6: Return to clinic in 6 months.  #7: I've advised the patient to call and make an appointment if she changes her mind and wants a cortisone injection in the left greater trochanter bursa. She knows that her blood pressure needs to be in the proper range before I can give her the cortisone. Patient understands and is agreeable   Orders: No orders of the defined types were placed in this encounter.  Meds ordered this encounter  Medications  . baclofen (LIORESAL) 10 MG tablet    Sig: Take 1 tablet (10 mg total) by mouth 2 (two) times daily.    Dispense:  180 each    Refill:  1  . diclofenac sodium (VOLTAREN) 1 % GEL    Sig: Voltaren Gel 3 grams to 3 large joints upto TID 3 TUBES with 3 refills    Dispense:  3 Tube    Refill:  3    Ok to dispense 1 tube at a time if patient desires.    Order Specific Question:   Supervising Provider    Answer:   Bo Merino [phone]    Face-to-face time spent with patient was 30 minutes. 50% of time was spent in counseling and coordination of care.  Follow-Up Instructions: Return in about 6 months (around 09/06/2017) for [place], Ramirez-Perez, left  greater tr bursitis.   Eliezer Lofts, PA-C  Note - This record has been created using [employer].  Chart creation errors have been sought, but may not always  have been located. Such creation errors do not reflect on  the standard of medical care.

## 2017-03-07 ENCOUNTER — Ambulatory Visit: Payer: Medicare Other | Admitting: Rheumatology

## 2017-03-07 LAB — COMPLETE METABOLIC PANEL WITH GFR
ALT: 11 U/L (ref 6–29)
AST: 16 U/L (ref 10–35)
Albumin: 3.8 g/dL (ref 3.6–5.1)
Alkaline Phosphatase: 60 U/L (ref 33–130)
BUN: 15 mg/dL (ref 7–25)
CO2: 25 mmol/L (ref 20–31)
Calcium: 9 mg/dL (ref 8.6–10.4)
Chloride: 108 mmol/L (ref 98–110)
Creat: 1.14 mg/dL — ABNORMAL HIGH (ref 0.50–0.99)
GFR, Est African American: 57 mL/min — ABNORMAL LOW (ref >=60)
GFR, Est Non African American: 50 mL/min — ABNORMAL LOW (ref >=60)
Glucose, Bld: 100 mg/dL — ABNORMAL HIGH (ref 65–99)
Potassium: 4.5 mmol/L (ref 3.5–5.3)
Sodium: 141 mmol/L (ref 135–146)
Total Bilirubin: 0.3 mg/dL (ref 0.2–1.2)
Total Protein: 6.5 g/dL (ref 6.1–8.1)

## 2017-03-07 LAB — VITAMIN D 25 HYDROXY (VIT D DEFICIENCY, FRACTURES): Vit D, 25-Hydroxy: 19 ng/mL — ABNORMAL LOW (ref 30–100)

## 2017-03-07 LAB — URIC ACID: Uric Acid, Serum: 5.8 mg/dL (ref 2.5–7.0)

## 2017-03-25 ENCOUNTER — Telehealth: Payer: Self-pay | Admitting: *Deleted

## 2017-03-25 MED ORDER — VITAMIN D (ERGOCALCIFEROL) 1.25 MG (50000 UNIT) PO CAPS: 50000.0000 [IU] | ORAL_CAPSULE | ORAL | 0 refills | Status: DC

## 2017-03-25 NOTE — Telephone Encounter (Signed)
-----   Message from Eliezer Lofts, [place] sent at 03/07/2017 12:53 PM EDT ----- Please send copy of labs to Dr. Rogene Houston, PCP And tell patient #1: Uric acid is in normal range at 5.8 (she was hyperuricemic on a previous labs but on this lab her levels are in good range)  #2: Vitamin D is low at 19. We will treat this as follows Vitamin D3 50,000 units twice a week; dispensed 24 pills with no refill Repeat vitamin D level in 3 months  #3: CBC with differential is within normal limits  #4: CMP with GFR is within normal limits except for elevated creatinine 1.14, GFR is slightly low at 57 (we will monitor)

## 2017-03-25 NOTE — Telephone Encounter (Signed)
Patient advised of lab results and recommendations. Prescription sent to the pharmacy. Patient verbalized understanding. Copt sent to PCP.

## 2017-04-14 ENCOUNTER — Other Ambulatory Visit: Payer: Self-pay | Admitting: Rheumatology

## 2017-04-14 NOTE — Telephone Encounter (Signed)
Last Visit: 03/06/17 Next Visit: 09/04/17 Labs: 03/06/17  CBC WNL CMP with GFR is within normal limits except for elevated creatinine 1.14, GFR is slightly low at 57  Okay to refill Mobic?

## 2017-04-26 ENCOUNTER — Other Ambulatory Visit: Payer: Self-pay | Admitting: Rheumatology

## 2017-04-28 NOTE — Telephone Encounter (Signed)
Last Visit: 03/25/17 Next Visit: 09/04/17  Okay to refill Restoril?

## 2017-04-29 ENCOUNTER — Other Ambulatory Visit: Payer: Self-pay | Admitting: Rheumatology

## 2017-04-29 ENCOUNTER — Telehealth: Payer: Self-pay | Admitting: Rheumatology

## 2017-04-29 NOTE — Telephone Encounter (Signed)
Patient advised prescription had been faxed to the pharmacy. Patient states she has picked up the prescription.

## 2017-04-29 NOTE — Telephone Encounter (Signed)
Patient is out of Temazepam, and pharmacy states doctor is denying rx. Patient uses [facility] [address]. Patient states she has been out for two days now, and the holiday is coming up. She needs her RX. Please call to advise.

## 2017-05-14 ENCOUNTER — Other Ambulatory Visit: Payer: Self-pay | Admitting: Rheumatology

## 2017-05-14 NOTE — Telephone Encounter (Signed)
ok 

## 2017-05-14 NOTE — Telephone Encounter (Signed)
Last Visit: 03/06/17 Next Visit: 09/04/17 Labs: 03/06/17  CBC WNL CMP with GFR is within normal limits except for elevated creatinine 1.14, GFR is slightly low at 57 Previous Creat. 1.11 GFR 59  Okay to refill Mobic?

## 2017-06-11 ENCOUNTER — Other Ambulatory Visit: Payer: Self-pay | Admitting: Rheumatology

## 2017-06-11 NOTE — Telephone Encounter (Signed)
03/06/17 last visit  09/04/17 Next visit   CMP Latest Ref Rng & Units 03/06/2017 08/11/2012  Glucose 65 - 99 mg/dL 100(H) 90  BUN 7 - 25 mg/dL 15 16  Creatinine 0.50 - 0.99 mg/dL 1.14(H) 0.89  Sodium 135 - 146 mmol/L 141 139  Potassium 3.5 - 5.3 mmol/L 4.5 4.8  Chloride 98 - 110 mmol/L 108 107  CO2 20 - 31 mmol/L 25 27  Calcium 8.6 - 10.4 mg/dL 9.0 9.2  Total Protein 6.1 - 8.1 g/dL 6.5 -  Total Bilirubin 0.2 - 1.2 mg/dL 0.3 -  Alkaline Phos 33 - 130 U/L 60 -  AST 10 - 35 U/L 16 -  ALT 6 - 29 U/L 11 -   Ok to refill per Dr Estanislado Pandy

## 2017-06-13 ENCOUNTER — Other Ambulatory Visit: Payer: Self-pay | Admitting: Rheumatology

## 2017-06-16 ENCOUNTER — Telehealth: Payer: Self-pay | Admitting: Radiology

## 2017-06-16 DIAGNOSIS — E559 Vitamin D deficiency, unspecified: Secondary | ICD-10-CM

## 2017-06-16 NOTE — Telephone Encounter (Signed)
I called her to advise she has voiced understanding

## 2017-06-16 NOTE — Telephone Encounter (Signed)
Patient needs labs for Vitamin D level

## 2017-07-17 ENCOUNTER — Other Ambulatory Visit: Payer: Self-pay | Admitting: Rheumatology

## 2017-07-17 ENCOUNTER — Other Ambulatory Visit: Payer: Self-pay | Admitting: *Deleted

## 2017-07-17 MED ORDER — TEMAZEPAM 15 MG PO CAPS: ORAL_CAPSULE | ORAL | 0 refills | Status: DC

## 2017-07-17 NOTE — Telephone Encounter (Signed)
03/06/17 last visit  09/04/17 Next visit  Okay to refill Temazepam?

## 2017-07-17 NOTE — Telephone Encounter (Signed)
ok 

## 2017-07-17 NOTE — Telephone Encounter (Signed)
03/06/17 last visit  09/04/17 Next visit Labs: 03/06/17 stable  Okay to refill per Dr. Estanislado Pandy

## 2017-08-15 ENCOUNTER — Other Ambulatory Visit: Payer: Self-pay | Admitting: Rheumatology

## 2017-08-15 NOTE — Telephone Encounter (Signed)
Last visit: 03/06/17 Next visit: 09/04/17 Labs: 03/06/17 stable  Ok to refill per Dr. Estanislado Pandy.

## 2017-08-24 NOTE — Progress Notes (Signed)
Office Visit Note  Patient: [name]             Date of Birth: February 21, 1951           MRN: [id number]             PCP: [name], MD Referring: [name], MD Visit Date: 09/04/2017 Occupation: @GUAROCC @    Subjective:  Fibromyalgia, increased pain.   History of Present Illness: [name] is a 66 y.o. female with history of fibromyalgia osteoarthritis and disc disease. She states she's been having increased pain and discomfort recently. She describes pain in all of her joints and muscles. She plays of fibromyalgia syndrome is flaring. She had left shoulder joint and rotator cuff tear repair in the past. She's been having increased pain in her left shoulder as well. She continues to have insomnia despite taking temazepam.  Activities of Daily Living:  Patient reports morning stiffness for 1 hour.   Patient Reports nocturnal pain.  Difficulty dressing/grooming: Denies Difficulty climbing stairs: Reports Difficulty getting out of chair: Reports Difficulty using hands for taps, buttons, cutlery, and/or writing: Denies   Review of Systems  Constitutional: Positive for fatigue. Negative for night sweats, weight gain, weight loss and weakness.  HENT: Positive for mouth dryness. Negative for mouth sores, trouble swallowing, trouble swallowing and nose dryness.   Eyes: Negative for pain, redness, visual disturbance and dryness.  Respiratory: Negative for cough, shortness of breath and difficulty breathing.   Cardiovascular: Positive for hypertension. Negative for chest pain, palpitations, irregular heartbeat and swelling in legs/feet.  Gastrointestinal: Negative for blood in stool, constipation and diarrhea.  Endocrine: Negative for increased urination.  Genitourinary: Negative for vaginal dryness.  Musculoskeletal: Positive for arthralgias, joint pain, myalgias, morning stiffness and myalgias. Negative for joint swelling, muscle weakness and muscle tenderness.    Skin: Negative for color change, rash, hair loss, skin tightness, ulcers and sensitivity to sunlight.  Allergic/Immunologic: Negative for susceptible to infections.  Neurological: Negative for dizziness, memory loss and night sweats.  Hematological: Negative for swollen glands.  Psychiatric/Behavioral: Positive for sleep disturbance. Negative for depressed mood. The patient is nervous/anxious.     PMFS History:  Patient Active Problem List   Diagnosis Date Noted  . Fibromyalgia 09/04/2017  . Primary insomnia 09/04/2017  . DDD (degenerative disc disease), cervical 09/04/2017  . DDD (degenerative disc disease), lumbar 09/04/2017  . Primary osteoarthritis of both hands 09/04/2017  . Primary osteoarthritis of both knees 09/04/2017  . History of rotator cuff tear 09/04/2017  . Osteopenia of multiple sites 09/04/2017  . History of diabetes mellitus 09/04/2017  . History of hypertension 09/04/2017  . History of hypercholesterolemia 09/04/2017    Past Medical History:  Diagnosis Date  . Anxiety   . Arthritis   . Asthma   . Depression   . Diabetes mellitus without complication ([place])   . Fibromyalgia   . GERD (gastroesophageal reflux disease)   . Hyperlipemia   . Hypertension   . PONV (postoperative nausea and vomiting)     Family History  Problem Relation Age of Onset  . Diabetes Mother   . Heart attack Father   . Diabetes Sister   . Throat cancer Brother   . Hypertension Daughter    Past Surgical History:  Procedure Laterality Date  . COLONOSCOPY    . DILATION AND CURETTAGE OF UTERUS    . KNEE ARTHROSCOPY  [id number]   left  . SHOULDER ARTHROSCOPY     left  .  TUBAL LIGATION     Social History   Social History Narrative  . Not on file     Objective: Vital Signs: BP 131/76 (BP Location: Left Arm, Patient Position: Sitting, Cuff Size: Normal)   Pulse 78   Resp 17   Ht 5\' 5"  (1.651 m)   Wt 155 lb (70.3 kg)   BMI 25.79 kg/m    Physical Exam  Constitutional:  She is oriented to person, place, and time. She appears well-developed and well-nourished.  HENT:  Head: Normocephalic and atraumatic.  Eyes: Conjunctivae and EOM are normal.  Neck: Normal range of motion.  Cardiovascular: Normal rate, regular rhythm, normal heart sounds and intact distal pulses.  Pulmonary/Chest: Effort normal and breath sounds normal.  Abdominal: Soft. Bowel sounds are normal.  Lymphadenopathy:    She has no cervical adenopathy.  Neurological: She is alert and oriented to person, place, and time.  Skin: Skin is warm and dry. Capillary refill takes less than 2 seconds.  Psychiatric: She has a normal mood and affect. Her behavior is normal.  Nursing note and vitals reviewed.    Musculoskeletal Exam: she has limited range of motion of her cervical and lumbar spine due to underlying disc disease. She painful limited range of motion of her left shoulder. Elbow joints wrist joint were good range of motion. She is some thickening of PIP/DIP joints in her hands consistent with osteoarthritis. Hip joints are good range of motion. She some crepitus in her knee joints with range of motion. Fibromyalgia tender points were 18 out of 18 positive.   CDAI Exam: No CDAI exam completed.    Investigation: No additional findings. CBC Latest Ref Rng & Units 03/06/2017 08/13/2012  WBC 3.8 - 10.8 K/uL 5.8 -  Hemoglobin 11.7 - 15.5 g/dL 11.7 12.4  Hematocrit 35.0 - 45.0 % 36.0 -  Platelets 140 - 400 K/uL 310 -   CMP Latest Ref Rng & Units 03/06/2017 08/11/2012  Glucose 65 - 99 mg/dL 100(H) 90  BUN 7 - 25 mg/dL 15 16  Creatinine 0.50 - 0.99 mg/dL 1.14(H) 0.89  Sodium 135 - 146 mmol/L 141 139  Potassium 3.5 - 5.3 mmol/L 4.5 4.8  Chloride 98 - 110 mmol/L 108 107  CO2 20 - 31 mmol/L 25 27  Calcium 8.6 - 10.4 mg/dL 9.0 9.2  Total Protein 6.1 - 8.1 g/dL 6.5 -  Total Bilirubin 0.2 - 1.2 mg/dL 0.3 -  Alkaline Phos 33 - 130 U/L 60 -  AST 10 - 35 U/L 16 -  ALT 6 - 29 U/L 11 -     Imaging: Xr Shoulder Left  Result Date: 09/04/2017 No glenohumeral or acromioclavicular joint space narrowing was noted. No chondrocalcinosis was noted. Impression normal x-ray of the shoulder.   Speciality Comments: No specialty comments available.    Procedures:  No procedures performed Allergies: Asa [aspirin]; Benadryl [diphenhydramine]; Darvon [propoxyphene]; Percocet [oxycodone-acetaminophen]; Sulfa antibiotics; Sulfamethoxazole; Talwin [pentazocine]; Tetracyclines & related; and Tetracycline   Assessment / Plan:     Visit Diagnoses: Fibromyalgia -she continues to have pain and discomfort due to fibromyalgia. She is on  baclofen 10 mg by mouth  twice a day when necessary.  Primary insomnia - on Temazepam 15  mg po qhs. She reports insomnia despite medications. Good sleep hygiene was discussed. She's been watching TV prior to point to bed which was discouraged.  Chronic left shoulder pain -she's been having increased pain and discomfort in her left shoulder joint. Plan: XR Shoulder Left. The x-ray  of the shoulder joint was unremarkable. I offered physical therapy patient declined. Have given her handout on exercises.  History of rotator cuff tear - left s/p repair in the past by Dr. Lorre Nick  Primary osteoarthritis of both hands: Joint protection and muscle strength in discussed.  Primary osteoarthritis of both knees: Chronic pain  DDD (degenerative disc disease), cervical: She has limited range of motion  DDD (degenerative disc disease), lumbar: She's chronic discomfort with limited range of motion.  Osteopenia of multiple sites: Use of calcium and vitamin D was discussed.  History of diabetes mellitus  History of hypertension: Blood pressure is controlled today.  History of hypercholesterolemia    Orders: Orders Placed This Encounter  Procedures  . XR Shoulder Left   No orders of the defined types were placed in this encounter.   Face-to-face time spent with  patient was  Minutes.Greater than 50% of time was spent in counseling and coordination of care.  Follow-Up Instructions: Return in about 6 months (around 03/04/2018) for FMS OA DDD.   Bo Merino, MD  Note - This record has been created using [employer].  Chart creation errors have been sought, but may not always  have been located. Such creation errors do not reflect on  the standard of medical care.

## 2017-08-28 ENCOUNTER — Other Ambulatory Visit: Payer: Self-pay | Admitting: Rheumatology

## 2017-08-28 NOTE — Telephone Encounter (Signed)
Last visit: 03/06/17 Next visit: 09/04/17  Okay to refill per Dr. Estanislado Pandy

## 2017-09-04 ENCOUNTER — Ambulatory Visit: Payer: Medicare Other | Admitting: Rheumatology

## 2017-09-04 ENCOUNTER — Ambulatory Visit (INDEPENDENT_AMBULATORY_CARE_PROVIDER_SITE_OTHER): Payer: Medicare Other

## 2017-09-04 ENCOUNTER — Encounter: Payer: Self-pay | Admitting: Rheumatology

## 2017-09-04 VITALS — BP 131/76 | HR 78 | Resp 17 | Ht 65.0 in | Wt 155.0 lb

## 2017-09-04 DIAGNOSIS — M25512 Pain in left shoulder: Secondary | ICD-10-CM

## 2017-09-04 DIAGNOSIS — M5030 Other cervical disc degeneration, unspecified cervical region: Secondary | ICD-10-CM

## 2017-09-04 DIAGNOSIS — Z8739 Personal history of other diseases of the musculoskeletal system and connective tissue: Secondary | ICD-10-CM | POA: Diagnosis not present

## 2017-09-04 DIAGNOSIS — M19041 Primary osteoarthritis, right hand: Secondary | ICD-10-CM | POA: Diagnosis not present

## 2017-09-04 DIAGNOSIS — M170 Bilateral primary osteoarthritis of knee: Secondary | ICD-10-CM

## 2017-09-04 DIAGNOSIS — M19042 Primary osteoarthritis, left hand: Secondary | ICD-10-CM

## 2017-09-04 DIAGNOSIS — G8929 Other chronic pain: Secondary | ICD-10-CM

## 2017-09-04 DIAGNOSIS — M797 Fibromyalgia: Secondary | ICD-10-CM | POA: Insufficient documentation

## 2017-09-04 DIAGNOSIS — F5101 Primary insomnia: Secondary | ICD-10-CM | POA: Diagnosis not present

## 2017-09-04 DIAGNOSIS — G4700 Insomnia, unspecified: Secondary | ICD-10-CM | POA: Insufficient documentation

## 2017-09-04 DIAGNOSIS — M51369 Other intervertebral disc degeneration, lumbar region without mention of lumbar back pain or lower extremity pain: Secondary | ICD-10-CM | POA: Insufficient documentation

## 2017-09-04 DIAGNOSIS — M5136 Other intervertebral disc degeneration, lumbar region: Secondary | ICD-10-CM

## 2017-09-04 DIAGNOSIS — Z8639 Personal history of other endocrine, nutritional and metabolic disease: Secondary | ICD-10-CM | POA: Insufficient documentation

## 2017-09-04 DIAGNOSIS — Z8679 Personal history of other diseases of the circulatory system: Secondary | ICD-10-CM | POA: Insufficient documentation

## 2017-09-04 DIAGNOSIS — M8589 Other specified disorders of bone density and structure, multiple sites: Secondary | ICD-10-CM

## 2017-09-04 NOTE — Patient Instructions (Signed)
Shoulder Exercises Ask your health care provider which exercises are safe for you. Do exercises exactly as told by your health care provider and adjust them as directed. It is normal to feel mild stretching, pulling, tightness, or discomfort as you do these exercises, but you should stop right away if you feel sudden pain or your pain gets worse.Do not begin these exercises until told by your health care provider. RANGE OF MOTION EXERCISES These exercises warm up your muscles and joints and improve the movement and flexibility of your shoulder. These exercises also help to relieve pain, numbness, and tingling. These exercises involve stretching your injured shoulder directly. Exercise A: Pendulum  1. Stand near a wall or a surface that you can hold onto for balance. 2. Bend at the waist and let your left / right arm hang straight down. Use your other arm to support you. Keep your back straight and do not lock your knees. 3. Relax your left / right arm and shoulder muscles, and move your hips and your trunk so your left / right arm swings freely. Your arm should swing because of the motion of your body, not because you are using your arm or shoulder muscles. 4. Keep moving your body so your arm swings in the following directions, as told by your health care provider: ? Side to side. ? Forward and backward. ? In clockwise and counterclockwise circles. 5. Continue each motion for __________ seconds, or for as long as told by your health care provider. 6. Slowly return to the starting position. Repeat __________ times. Complete this exercise __________ times a day. Exercise B:Flexion, Standing  1. Stand and hold a broomstick, a cane, or a similar object. Place your hands a little more than shoulder-width apart on the object. Your left / right hand should be palm-up, and your other hand should be palm-down. 2. Keep your elbow straight and keep your shoulder muscles relaxed. Push the stick down with  your healthy arm to raise your left / right arm in front of your body, and then over your head until you feel a stretch in your shoulder. ? Avoid shrugging your shoulder while you raise your arm. Keep your shoulder blade tucked down toward the middle of your back. 3. Hold for __________ seconds. 4. Slowly return to the starting position. Repeat __________ times. Complete this exercise __________ times a day. Exercise C: Abduction, Standing 1. Stand and hold a broomstick, a cane, or a similar object. Place your hands a little more than shoulder-width apart on the object. Your left / right hand should be palm-up, and your other hand should be palm-down. 2. While keeping your elbow straight and your shoulder muscles relaxed, push the stick across your body toward your left / right side. Raise your left / right arm to the side of your body and then over your head until you feel a stretch in your shoulder. ? Do not raise your arm above shoulder height, unless your health care provider tells you to do that. ? Avoid shrugging your shoulder while you raise your arm. Keep your shoulder blade tucked down toward the middle of your back. 3. Hold for __________ seconds. 4. Slowly return to the starting position. Repeat __________ times. Complete this exercise __________ times a day. Exercise D:Internal Rotation  1. Place your left / right hand behind your back, palm-up. 2. Use your other hand to dangle an exercise band, a towel, or a similar object over your shoulder. Grasp the band with   your left / right hand so you are holding onto both ends. 3. Gently pull up on the band until you feel a stretch in the front of your left / right shoulder. ? Avoid shrugging your shoulder while you raise your arm. Keep your shoulder blade tucked down toward the middle of your back. 4. Hold for __________ seconds. 5. Release the stretch by letting go of the band and lowering your hands. Repeat __________ times. Complete  this exercise __________ times a day. STRETCHING EXERCISES These exercises warm up your muscles and joints and improve the movement and flexibility of your shoulder. These exercises also help to relieve pain, numbness, and tingling. These exercises are done using your healthy shoulder to help stretch the muscles of your injured shoulder. Exercise E: Corner Stretch (External Rotation and Abduction)  1. Stand in a doorway with one of your feet slightly in front of the other. This is called a staggered stance. If you cannot reach your forearms to the door frame, stand facing a corner of a room. 2. Choose one of the following positions as told by your health care provider: ? Place your hands and forearms on the door frame above your head. ? Place your hands and forearms on the door frame at the height of your head. ? Place your hands on the door frame at the height of your elbows. 3. Slowly move your weight onto your front foot until you feel a stretch across your chest and in the front of your shoulders. Keep your head and chest upright and keep your abdominal muscles tight. 4. Hold for __________ seconds. 5. To release the stretch, shift your weight to your back foot. Repeat __________ times. Complete this stretch __________ times a day. Exercise F:Extension, Standing 1. Stand and hold a broomstick, a cane, or a similar object behind your back. ? Your hands should be a little wider than shoulder-width apart. ? Your palms should face away from your back. 2. Keeping your elbows straight and keeping your shoulder muscles relaxed, move the stick away from your body until you feel a stretch in your shoulder. ? Avoid shrugging your shoulders while you move the stick. Keep your shoulder blade tucked down toward the middle of your back. 3. Hold for __________ seconds. 4. Slowly return to the starting position. Repeat __________ times. Complete this exercise __________ times a day. STRENGTHENING  EXERCISES These exercises build strength and endurance in your shoulder. Endurance is the ability to use your muscles for a long time, even after they get tired. Exercise G:External Rotation  1. Sit in a stable chair without armrests. 2. Secure an exercise band at elbow height on your left / right side. 3. Place a soft object, such as a folded towel or a small pillow, between your left / right upper arm and your body to move your elbow a few inches away (about 10 cm) from your side. 4. Hold the end of the band so it is tight and there is no slack. 5. Keeping your elbow pressed against the soft object, move your left / right forearm out, away from your abdomen. Keep your body steady so only your forearm moves. 6. Hold for __________ seconds. 7. Slowly return to the starting position. Repeat __________ times. Complete this exercise __________ times a day. Exercise H:Shoulder Abduction  1. Sit in a stable chair without armrests, or stand. 2. Hold a __________ weight in your left / right hand, or hold an exercise band with both hands.   3. Start with your arms straight down and your left / right palm facing in, toward your body. 4. Slowly lift your left / right hand out to your side. Do not lift your hand above shoulder height unless your health care provider tells you that this is safe. ? Keep your arms straight. ? Avoid shrugging your shoulder while you do this movement. Keep your shoulder blade tucked down toward the middle of your back. 5. Hold for __________ seconds. 6. Slowly lower your arm, and return to the starting position. Repeat __________ times. Complete this exercise __________ times a day. Exercise I:Shoulder Extension 1. Sit in a stable chair without armrests, or stand. 2. Secure an exercise band to a stable object in front of you where it is at shoulder height. 3. Hold one end of the exercise band in each hand. Your palms should face each other. 4. Straighten your elbows and  lift your hands up to shoulder height. 5. Step back, away from the secured end of the exercise band, until the band is tight and there is no slack. 6. Squeeze your shoulder blades together as you pull your hands down to the sides of your thighs. Stop when your hands are straight down by your sides. Do not let your hands go behind your body. 7. Hold for __________ seconds. 8. Slowly return to the starting position. Repeat __________ times. Complete this exercise __________ times a day. Exercise J:Standing Shoulder Row 1. Sit in a stable chair without armrests, or stand. 2. Secure an exercise band to a stable object in front of you so it is at waist height. 3. Hold one end of the exercise band in each hand. Your palms should be in a thumbs-up position. 4. Bend each of your elbows to an "L" shape (about 90 degrees) and keep your upper arms at your sides. 5. Step back until the band is tight and there is no slack. 6. Slowly pull your elbows back behind you. 7. Hold for __________ seconds. 8. Slowly return to the starting position. Repeat __________ times. Complete this exercise __________ times a day. Exercise K:Shoulder Press-Ups  1. Sit in a stable chair that has armrests. Sit upright, with your feet flat on the floor. 2. Put your hands on the armrests so your elbows are bent and your fingers are pointing forward. Your hands should be about even with the sides of your body. 3. Push down on the armrests and use your arms to lift yourself off of the chair. Straighten your elbows and lift yourself up as much as you comfortably can. ? Move your shoulder blades down, and avoid letting your shoulders move up toward your ears. ? Keep your feet on the ground. As you get stronger, your feet should support less of your body weight as you lift yourself up. 4. Hold for __________ seconds. 5. Slowly lower yourself back into the chair. Repeat __________ times. Complete this exercise __________ times a  day. Exercise L: Wall Push-Ups  1. Stand so you are facing a stable wall. Your feet should be about one arm-length away from the wall. 2. Lean forward and place your palms on the wall at shoulder height. 3. Keep your feet flat on the floor as you bend your elbows and lean forward toward the wall. 4. Hold for __________ seconds. 5. Straighten your elbows to push yourself back to the starting position. Repeat __________ times. Complete this exercise __________ times a day. This information is not intended to replace advice   given to you by your health care provider. Make sure you discuss any questions you have with your health care provider. Document Released: 08/28/2005 Document Revised: 07/08/2016 Document Reviewed: 06/25/2015 Elsevier Interactive Patient Education  2018 [employer]  

## 2017-10-01 ENCOUNTER — Other Ambulatory Visit: Payer: Self-pay | Admitting: *Deleted

## 2017-10-01 MED ORDER — TEMAZEPAM 15 MG PO CAPS: ORAL_CAPSULE | ORAL | 0 refills | Status: DC

## 2017-10-01 NOTE — Telephone Encounter (Signed)
Refill Request received via fax  Last Visit: 09/04/17 Next visit: 03/06/18  Okay to refill Temazepam?

## 2017-10-01 NOTE — Telephone Encounter (Signed)
ok 

## 2017-10-02 ENCOUNTER — Telehealth: Payer: Self-pay

## 2017-10-02 NOTE — Telephone Encounter (Signed)
Patient called concerning Rx for Temazepam.  Advised patient that Rx will be faxed to her pharmacy per Wayne Sever.

## 2017-10-08 ENCOUNTER — Telehealth: Payer: Self-pay

## 2017-10-08 NOTE — Telephone Encounter (Signed)
A prior authorization for temazepam was requested by [facility]. Authorization was submitted to pts insurance via cover my meds. Will update once we receive a response.   Shanta Dorvil, [place], CPhT 12:06 PM

## 2017-10-09 ENCOUNTER — Telehealth: Payer: Self-pay

## 2017-10-09 NOTE — Telephone Encounter (Signed)
Received a fax from [facility] regarding a prior authorization DENIAL for TEMAZEPAM. The medication is only covered when pt has tried and failed: Belsomra, Rozerem, Trazadone or specific medical reasons why the alternatives are not appropriate to treat the medical condition.   Called patient to update. Left message.   Reference [id number] Phone [phone]  Will send document to scan center.  Ahnesti Townsend, [place], CPhT 10:08 AM

## 2017-10-09 NOTE — Telephone Encounter (Signed)
Patient was returning your call.  Cb# is [id number].  Please advise.

## 2017-11-10 ENCOUNTER — Other Ambulatory Visit: Payer: Self-pay | Admitting: Rheumatology

## 2017-11-10 NOTE — Telephone Encounter (Signed)
Last Visit: 09/04/17 Next visit: 03/06/18 Labs: 09/02/17 stable  Okay to refill per Dr. Estanislado Pandy

## 2017-11-24 ENCOUNTER — Other Ambulatory Visit: Payer: Self-pay | Admitting: Rheumatology

## 2017-11-24 NOTE — Telephone Encounter (Signed)
Last Visit: 09/04/17 Next visit: 03/06/18  Okay to refill per Dr. Estanislado Pandy

## 2018-01-02 ENCOUNTER — Telehealth: Payer: Self-pay | Admitting: Rheumatology

## 2018-01-02 MED ORDER — TEMAZEPAM 15 MG PO CAPS: ORAL_CAPSULE | ORAL | 0 refills | Status: DC

## 2018-01-02 NOTE — Telephone Encounter (Signed)
Patient called requesting a prescription refill of Temazepam.  Patient's pharmacy is [facility] on [address].

## 2018-01-02 NOTE — Telephone Encounter (Signed)
Last Visit: 09/04/17 Next visit: 03/06/18  Okay to refill Restoril?

## 2018-01-16 ENCOUNTER — Other Ambulatory Visit: Payer: Self-pay | Admitting: Family Medicine

## 2018-01-16 DIAGNOSIS — Z1231 Encounter for screening mammogram for malignant neoplasm of breast: Secondary | ICD-10-CM

## 2018-02-06 ENCOUNTER — Ambulatory Visit: Payer: Medicare PPO

## 2018-02-06 ENCOUNTER — Ambulatory Visit
Admission: RE | Admit: 2018-02-06 | Discharge: 2018-02-06 | Disposition: A | Discharge status: Home / Self Care | Payer: Medicare Other | Source: Ambulatory Visit | Attending: Family Medicine | Admitting: Family Medicine

## 2018-02-06 DIAGNOSIS — Z1231 Encounter for screening mammogram for malignant neoplasm of breast: Secondary | ICD-10-CM

## 2018-02-09 ENCOUNTER — Other Ambulatory Visit: Payer: Self-pay | Admitting: Rheumatology

## 2018-02-09 NOTE — Telephone Encounter (Addendum)
Last Visit: 09/04/17 Next visit: 03/06/18 Labs: 09/02/17 stable  Okay to refill per Dr. Estanislado Pandy

## 2018-02-20 NOTE — Progress Notes (Signed)
Office Visit Note  Patient: [name]             Date of Birth: [birth year]           MRN: [id number]             PCP: [name], MD Referring: [name], MD Visit Date: 03/06/2018 Occupation: @GUAROCC @    Subjective:  Generalized pain   History of Present Illness: [name] is a 67 y.o. female with history of fibromyalgia, DDD, and osteoarthritis.  She has been having generalized muscle aches and muscle tenderness due to fibromyalgia.  She has been taking baclofen as needed for muscle spasms.  She continues to have lower back pain as well as bursitis bilaterally.  He states that she becomes more achy with weather changes.  She states she continues to have insomnia and takes Restoril at bedtime.  She feels that her fatigue is stable.  She has been walking for exercise on a regular basis.  She has chronic neck pain and stiffness.  She states that she is having some symptoms of left-sided radiculopathy.  She takes Mobic as needed for pain relief.  She is no longer using Voltaren gel due to it not being effective for her.  She states that she is having pain in her bilateral hands and is noticed some swelling.  She states she is also having pain in her knees but denies any joint swelling in her knee joints.  She states she is having stiffness in her knees and hands.   Activities of Daily Living:  Patient reports morning stiffness for 5-10  minutes.   Patient Reports nocturnal pain.  Difficulty dressing/grooming: Denies Difficulty climbing stairs: Reports Difficulty getting out of chair: Reports Difficulty using hands for taps, buttons, cutlery, and/or writing: Reports   Review of Systems  Constitutional: Positive for fatigue.  HENT: Positive for mouth dryness. Negative for mouth sores, trouble swallowing, trouble swallowing and nose dryness.   Eyes: Negative for pain, visual disturbance and dryness.  Respiratory: Negative for cough, hemoptysis, shortness of breath  and difficulty breathing.   Cardiovascular: Negative for chest pain, palpitations, hypertension and swelling in legs/feet.  Gastrointestinal: Negative for blood in stool, constipation and diarrhea.  Endocrine: Negative for increased urination.  Genitourinary: Negative for painful urination.  Musculoskeletal: Positive for arthralgias, joint pain, joint swelling, myalgias, morning stiffness, muscle tenderness and myalgias. Negative for muscle weakness.  Skin: Negative for color change, pallor, rash, hair loss, nodules/bumps, skin tightness, ulcers and sensitivity to sunlight.  Allergic/Immunologic: Negative for susceptible to infections.  Neurological: Negative for dizziness, numbness, headaches and weakness.  Hematological: Negative for swollen glands.  Psychiatric/Behavioral: Positive for sleep disturbance. Negative for depressed mood. The patient is not nervous/anxious.     PMFS History:  Patient Active Problem List   Diagnosis Date Noted  . Fibromyalgia 09/04/2017  . Primary insomnia 09/04/2017  . DDD (degenerative disc disease), cervical 09/04/2017  . DDD (degenerative disc disease), lumbar 09/04/2017  . Primary osteoarthritis of both hands 09/04/2017  . Primary osteoarthritis of both knees 09/04/2017  . History of rotator cuff tear 09/04/2017  . Osteopenia of multiple sites 09/04/2017  . History of diabetes mellitus 09/04/2017  . History of hypertension 09/04/2017  . History of hypercholesterolemia 09/04/2017    Past Medical History:  Diagnosis Date  . Anxiety   . Arthritis   . Asthma   . Depression   . Diabetes mellitus without complication ([place])   . Fibromyalgia   .  GERD (gastroesophageal reflux disease)   . Hyperlipemia   . Hypertension   . PONV (postoperative nausea and vomiting)     Family History  Problem Relation Age of Onset  . Diabetes Mother   . Heart attack Father   . Diabetes Sister   . Throat cancer Brother   . Hypertension Daughter    Past Surgical  History:  Procedure Laterality Date  . COLONOSCOPY    . DILATION AND CURETTAGE OF UTERUS    . KNEE ARTHROSCOPY  [id number]   left  . SHOULDER ARTHROSCOPY     left  . TRIGGER FINGER RELEASE  08/13/2012   Procedure: RELEASE TRIGGER FINGER/A-1 PULLEY;  Surgeon: [name]., MD;  Location: [facility];  Service: Orthopedics;  Laterality: Right;  long finger  . TUBAL LIGATION     Social History   Social History Narrative  . Not on file     Objective: Vital Signs: BP 110/68 (BP Location: Right Arm, Patient Position: Sitting, Cuff Size: Normal)   Pulse 67   Resp 16   Ht 5\' 5"  (1.651 m)   Wt 155 lb (70.3 kg)   BMI 25.79 kg/m    Physical Exam  Constitutional: She is oriented to person, place, and time. She appears well-developed and well-nourished.  HENT:  Head: Normocephalic and atraumatic.  Eyes: Conjunctivae and EOM are normal.  Neck: Normal range of motion.  Cardiovascular: Normal rate, regular rhythm, normal heart sounds and intact distal pulses.  Pulmonary/Chest: Effort normal and breath sounds normal.  Abdominal: Soft. Bowel sounds are normal.  Lymphadenopathy:    She has no cervical adenopathy.  Neurological: She is alert and oriented to person, place, and time.  Skin: Skin is warm and dry. Capillary refill takes less than 2 seconds.  Psychiatric: She has a normal mood and affect. Her behavior is normal.  Nursing note and vitals reviewed.    Musculoskeletal Exam: C-spine limited range of motion with discomfort.  She has limited range of motion of thoracic and lumbar spine.  She has tenderness in bilateral SI joints.  Shoulder joints, elbow joints, wrist joints, MCPs, PIPs, DIPs good range of motion with no synovitis.  She is PIP and DIP synovial thickening consistent with osteoarthritis of bilateral hands.  Hip joints, knee joints, ankle joints, MTPs, PIPs, DIPs good range of motion with no synovitis.  No warmth or effusion of bilateral knees.  She has  tenderness of bilateral trochanteric bursa.  CDAI Exam: No CDAI exam completed.    Investigation: No additional findings.   Imaging: Mm Screening Breast Tomo Bilateral  Result Date: 02/09/2018 CLINICAL DATA:  Screening. EXAM: DIGITAL SCREENING BILATERAL MAMMOGRAM WITH TOMO AND CAD COMPARISON:  Previous exam(s). ACR Breast Density Category b: There are scattered areas of fibroglandular density. FINDINGS: There are no findings suspicious for malignancy. Images were processed with CAD. IMPRESSION: No mammographic evidence of malignancy. A result letter of this screening mammogram will be mailed directly to the patient. RECOMMENDATION: Screening mammogram in one year. (Code:SM-B-01Y) BI-RADS CATEGORY  1: Negative. Electronically Signed   By: Ammie Ferrier M.D.   On: 02/09/2018 08:41    Speciality Comments: No specialty comments available.    Procedures:  No procedures performed Allergies: Asa [aspirin]; Benadryl [diphenhydramine]; Darvon [propoxyphene]; Percocet [oxycodone-acetaminophen]; Sulfa antibiotics; Sulfamethoxazole; Talwin [pentazocine]; Tetracyclines & related; and Tetracycline   Assessment / Plan:     Visit Diagnoses: Fibromyalgia - She has generalized muscle tenderness and muscle aches due to fibromyalgia. She has tenderness  in bilateral SI joints and bilateral trochanteric bursitis.  She declined a cortisone injection today.  She is on baclofen 10 mg by mouth twice a day when necessary for muscle spasms.  Her fatigue has been stable.  She takes Restoril at bedtime for insomnia.  She has been walking for exercise.  She was encouraged to continue to exercise on a regular basis.  Good sleep hygiene was discussed.   Primary insomnia - on Temazepam 15  mg po qhs  Primary osteoarthritis of both hands: She has PIP and DIP synovial thickening consistent with osteoarthritis.  Joint protection and muscle strengthening were discussed.   Primary osteoarthritis of both knees: No warmth  or effusion of knee joints. She has chronic pain in bilateral knee joints. A handout of knee exercises will be performed today.   History of rotator cuff tear - left s/p repair in the past by Dr. Lorre Nick.  She has good ROM with no discomfort.    DDD (degenerative disc disease), cervical: She has limited ROM of the C-spine with discomfort.  She reports periodic left sided radiculopathy symptoms.    DDD (degenerative disc disease), lumbar: Chronic pain   Osteopenia of multiple sites: She takes Vitamin D 2,000 units by mouth daily.   Other medical conditions are listed as follows:   History of hypercholesterolemia  History of hypertension  History of diabetes mellitus    Orders: No orders of the defined types were placed in this encounter.  No orders of the defined types were placed in this encounter.    Follow-Up Instructions: Return in about 6 months (around 09/06/2018) for Fibromyalgia, Osteoarthritis, DDD.   Ofilia Neas, PA-C   I examined and evaluated the patient with Hazel Sams PA. The plan of care was discussed as noted above.  Bo Merino, MD  Note - This record has been created using Editor, commissioning.  Chart creation errors have been sought, but may not always  have been located. Such creation errors do not reflect on  the standard of medical care.

## 2018-03-06 ENCOUNTER — Ambulatory Visit: Payer: Medicare Other | Admitting: Rheumatology

## 2018-03-06 ENCOUNTER — Encounter: Payer: Self-pay | Admitting: Rheumatology

## 2018-03-06 VITALS — BP 110/68 | HR 67 | Resp 16 | Ht 65.0 in | Wt 155.0 lb

## 2018-03-06 DIAGNOSIS — M170 Bilateral primary osteoarthritis of knee: Secondary | ICD-10-CM

## 2018-03-06 DIAGNOSIS — M5030 Other cervical disc degeneration, unspecified cervical region: Secondary | ICD-10-CM

## 2018-03-06 DIAGNOSIS — M19042 Primary osteoarthritis, left hand: Secondary | ICD-10-CM

## 2018-03-06 DIAGNOSIS — Z8739 Personal history of other diseases of the musculoskeletal system and connective tissue: Secondary | ICD-10-CM

## 2018-03-06 DIAGNOSIS — Z8639 Personal history of other endocrine, nutritional and metabolic disease: Secondary | ICD-10-CM | POA: Diagnosis not present

## 2018-03-06 DIAGNOSIS — Z8679 Personal history of other diseases of the circulatory system: Secondary | ICD-10-CM | POA: Diagnosis not present

## 2018-03-06 DIAGNOSIS — M797 Fibromyalgia: Secondary | ICD-10-CM

## 2018-03-06 DIAGNOSIS — F5101 Primary insomnia: Secondary | ICD-10-CM

## 2018-03-06 DIAGNOSIS — M8589 Other specified disorders of bone density and structure, multiple sites: Secondary | ICD-10-CM | POA: Diagnosis not present

## 2018-03-06 DIAGNOSIS — M5136 Other intervertebral disc degeneration, lumbar region: Secondary | ICD-10-CM | POA: Diagnosis not present

## 2018-03-06 DIAGNOSIS — M19041 Primary osteoarthritis, right hand: Secondary | ICD-10-CM | POA: Diagnosis not present

## 2018-03-06 NOTE — Patient Instructions (Signed)
[place]    Knee Exercises Ask your health care provider which exercises are safe for you. Do exercises exactly as told by your health care provider and adjust them as directed. It is normal to feel mild stretching, pulling, tightness, or discomfort as you do these exercises, but you should stop right away if you feel sudden pain or your pain gets worse.Do not begin these exercises until told by your health care provider. STRETCHING AND RANGE OF MOTION EXERCISES These exercises warm up your muscles and joints and improve the movement and flexibility of your knee. These exercises also help to relieve pain, numbness, and tingling. Exercise A: Knee Extension, Prone 1. Lie on your abdomen on a bed. 2. Place your left / right knee just beyond the edge of the surface so your knee is not on the bed. You can put a towel under your left / right thigh just above your knee for comfort. 3. Relax your leg muscles and allow gravity to straighten your knee. You should feel a stretch behind your left / right knee. 4. Hold this position for __________ seconds. 5. Scoot up so your knee is supported between repetitions. Repeat __________ times. Complete this stretch __________ times a day. Exercise B: Knee Flexion, Active  1. Lie on your back with both knees straight. If this causes back discomfort, bend your left / right knee so your foot is flat on the floor. 2. Slowly slide your left / right heel back toward your buttocks until you feel a gentle stretch in the front of your knee or thigh. 3. Hold this position for __________ seconds. 4. Slowly slide your left / right heel back to the starting position. Repeat __________ times. Complete this exercise __________ times a day. Exercise C: Quadriceps, Prone  1. Lie on your abdomen on a firm surface, such as a bed or padded floor. 2. Bend your left / right knee and hold your ankle. If you cannot reach your ankle or pant leg, loop a belt around your foot and  grab the belt instead. 3. Gently pull your heel toward your buttocks. Your knee should not slide out to the side. You should feel a stretch in the front of your thigh and knee. 4. Hold this position for __________ seconds. Repeat __________ times. Complete this stretch __________ times a day. Exercise D: Hamstring, Supine 1. Lie on your back. 2. Loop a belt or towel over the ball of your left / right foot. The ball of your foot is on the walking surface, right under your toes. 3. Straighten your left / right knee and slowly pull on the belt to raise your leg until you feel a gentle stretch behind your knee. ? Do not let your left / right knee bend while you do this. ? Keep your other leg flat on the floor. 4. Hold this position for __________ seconds. Repeat __________ times. Complete this stretch __________ times a day. STRENGTHENING EXERCISES These exercises build strength and endurance in your knee. Endurance is the ability to use your muscles for a long time, even after they get tired. Exercise E: Quadriceps, Isometric  1. Lie on your back with your left / right leg extended and your other knee bent. Put a rolled towel or small pillow under your knee if told by your health care provider. 2. Slowly tense the muscles in the front of your left / right thigh. You should see your kneecap slide up toward your hip or see increased dimpling just  above the knee. This motion will push the back of the knee toward the floor. 3. For __________ seconds, keep the muscle as tight as you can without increasing your pain. 4. Relax the muscles slowly and completely. Repeat __________ times. Complete this exercise __________ times a day. Exercise F: Straight Leg Raises - Quadriceps 1. Lie on your back with your left / right leg extended and your other knee bent. 2. Tense the muscles in the front of your left / right thigh. You should see your kneecap slide up or see increased dimpling just above the knee.  Your thigh may even shake a bit. 3. Keep these muscles tight as you raise your leg 4-6 inches (10-15 cm) off the floor. Do not let your knee bend. 4. Hold this position for __________ seconds. 5. Keep these muscles tense as you lower your leg. 6. Relax your muscles slowly and completely after each repetition. Repeat __________ times. Complete this exercise __________ times a day. Exercise G: Hamstring, Isometric 1. Lie on your back on a firm surface. 2. Bend your left / right knee approximately __________ degrees. 3. Dig your left / right heel into the surface as if you are trying to pull it toward your buttocks. Tighten the muscles in the back of your thighs to dig as hard as you can without increasing any pain. 4. Hold this position for __________ seconds. 5. Release the tension gradually and allow your muscles to relax completely for __________ seconds after each repetition. Repeat __________ times. Complete this exercise __________ times a day. Exercise H: Hamstring Curls  If told by your health care provider, do this exercise while wearing ankle weights. Begin with __________ weights. Then increase the weight by 1 lb (0.5 kg) increments. Do not wear ankle weights that are more than __________. 1. Lie on your abdomen with your legs straight. 2. Bend your left / right knee as far as you can without feeling pain. Keep your hips flat against the floor. 3. Hold this position for __________ seconds. 4. Slowly lower your leg to the starting position.  Repeat __________ times. Complete this exercise __________ times a day. Exercise I: Squats (Quadriceps) 1. Stand in front of a table, with your feet and knees pointing straight ahead. You may rest your hands on the table for balance but not for support. 2. Slowly bend your knees and lower your hips like you are going to sit in a chair. ? Keep your weight over your heels, not over your toes. ? Keep your lower legs upright so they are parallel  with the table legs. ? Do not let your hips go lower than your knees. ? Do not bend lower than told by your health care provider. ? If your knee pain increases, do not bend as low. 3. Hold the squat position for __________ seconds. 4. Slowly push with your legs to return to standing. Do not use your hands to pull yourself to standing. Repeat __________ times. Complete this exercise __________ times a day. Exercise J: Wall Slides (Quadriceps)  1. Lean your back against a smooth wall or door while you walk your feet out 18-24 inches (46-61 cm) from it. 2. Place your feet hip-width apart. 3. Slowly slide down the wall or door until your knees bend __________ degrees. Keep your knees over your heels, not over your toes. Keep your knees in line with your hips. 4. Hold for __________ seconds. Repeat __________ times. Complete this exercise __________ times a day. Exercise K:  Straight Leg Raises - Hip Abductors 1. Lie on your side with your left / right leg in the top position. Lie so your head, shoulder, knee, and hip line up. You may bend your bottom knee to help you keep your balance. 2. Roll your hips slightly forward so your hips are stacked directly over each other and your left / right knee is facing forward. 3. Leading with your heel, lift your top leg 4-6 inches (10-15 cm). You should feel the muscles in your outer hip lifting. ? Do not let your foot drift forward. ? Do not let your knee roll toward the ceiling. 4. Hold this position for __________ seconds. 5. Slowly return your leg to the starting position. 6. Let your muscles relax completely after each repetition. Repeat __________ times. Complete this exercise __________ times a day. Exercise L: Straight Leg Raises - Hip Extensors 1. Lie on your abdomen on a firm surface. You can put a pillow under your hips if that is more comfortable. 2. Tense the muscles in your buttocks and lift your left / right leg about 4-6 inches (10-15 cm).  Keep your knee straight as you lift your leg. 3. Hold this position for __________ seconds. 4. Slowly lower your leg to the starting position. 5. Let your leg relax completely after each repetition. Repeat __________ times. Complete this exercise __________ times a day. This information is not intended to replace advice given to you by your health care provider. Make sure you discuss any questions you have with your health care provider. Document Released: 08/28/2005 Document Revised: 07/08/2016 Document Reviewed: 08/20/2015 Elsevier Interactive Patient Education  2018 [place]. Trochanteric Bursitis Rehab Ask your health care provider which exercises are safe for you. Do exercises exactly as told by your health care provider and adjust them as directed. It is normal to feel mild stretching, pulling, tightness, or discomfort as you do these exercises, but you should stop right away if you feel sudden pain or your pain gets worse.Do not begin these exercises until told by your health care provider. Stretching exercises These exercises warm up your muscles and joints and improve the movement and flexibility of your hip. These exercises also help to relieve pain and stiffness. Exercise A: Iliotibial band stretch  1. Lie on your side with your left / right leg in the top position. 2. Bend your left / right knee and grab your ankle. 3. Slowly bring your knee back so your thigh is behind your body. 4. Slowly lower your knee toward the floor until you feel a gentle stretch on the outside of your left / right thigh. If you do not feel a stretch and your knee will not fall farther, place the heel of your other foot on top of your outer knee and pull your thigh down farther. 5. Hold this position for __________ seconds. 6. Slowly return to the starting position. Repeat __________ times. Complete this exercise __________ times a day. Strengthening exercises These exercises build strength and  endurance in your hip and pelvis. Endurance is the ability to use your muscles for a long time, even after they get tired. Exercise B: Bridge ( hip extensors) 1. Lie on your back on a firm surface with your knees bent and your feet flat on the floor. 2. Tighten your buttocks muscles and lift your buttocks off the floor until your trunk is level with your thighs. You should feel the muscles working in your buttocks and the back of your thighs.  If this exercise is too easy, try doing it with your arms crossed over your chest. 3. Hold this position for __________ seconds. 4. Slowly return to the starting position. 5. Let your muscles relax completely between repetitions. Repeat __________ times. Complete this exercise __________ times a day. Exercise C: Squats ( knee extensors and  quadriceps) 1. Stand in front of a table, with your feet and knees pointing straight ahead. You may rest your hands on the table for balance but not for support. 2. Slowly bend your knees and lower your hips like you are going to sit in a chair. ? Keep your weight over your heels, not over your toes. ? Keep your lower legs upright so they are parallel with the table legs. ? Do not let your hips go lower than your knees. ? Do not bend lower than told by your health care provider. ? If your hip pain increases, do not bend as low. 3. Hold this position for __________ seconds. 4. Slowly push with your legs to return to standing. Do not use your hands to pull yourself to standing. Repeat __________ times. Complete this exercise __________ times a day. Exercise D: Hip hike 1. Stand sideways on a bottom step. Stand on your left / right leg with your other foot unsupported next to the step. You can hold onto the railing or wall if needed for balance. 2. Keeping your knees straight and your torso square, lift your left / right hip up toward the ceiling. 3. Hold this position for __________ seconds. 4. Slowly let your left /  right hip lower toward the floor, past the starting position. Your foot should get closer to the floor. Do not lean or bend your knees. Repeat __________ times. Complete this exercise __________ times a day. Exercise E: Single leg stand 1. Stand near a counter or door frame that you can hold onto for balance as needed. It is helpful to stand in front of a mirror for this exercise so you can watch your hip. 2. Squeeze your left / right buttock muscles then lift up your other foot. Do not let your left / right hip push out to the side. 3. Hold this position for __________ seconds. Repeat __________ times. Complete this exercise __________ times a day. This information is not intended to replace advice given to you by your health care provider. Make sure you discuss any questions you have with your health care provider. Document Released: 11/21/2004 Document Revised: 06/20/2016 Document Reviewed: 09/29/2015 Elsevier Interactive Patient Education  2018 [place] Band Syndrome Rehab Ask your health care provider which exercises are safe for you. Do exercises exactly as told by your health care provider and adjust them as directed. It is normal to feel mild stretching, pulling, tightness, or discomfort as you do these exercises, but you should stop right away if you feel sudden pain or your pain gets worse.Do not begin these exercises until told by your health care provider. Stretching and range of motion exercises These exercises warm up your muscles and joints and improve the movement and flexibility of your hip and pelvis. Exercise A: Quadriceps, prone  1. Lie on your abdomen on a firm surface, such as a bed or padded floor. 2. Bend your left / right knee and hold your ankle. If you cannot reach your ankle or pant leg, loop a belt around your foot and grab the belt instead. 3. Gently pull your heel toward your buttocks. Your knee should not  slide out to the side. You should feel a  stretch in the front of your thigh and knee. 4. Hold this position for __________ seconds. Repeat __________ times. Complete this stretch __________ times a day. Exercise B: Iliotibial band  1. Lie on your side with your left / right leg in the top position. 2. Bend both of your knees and grab your left / right ankle. Stretch out your bottom arm to help you balance. 3. Slowly bring your top knee back so your thigh goes behind your trunk. 4. Slowly lower your top leg toward the floor until you feel a gentle stretch on the outside of your left / right hip and thigh. If you do not feel a stretch and your knee will not fall farther, place the heel of your other foot on top of your knee and pull your knee down toward the floor with your foot. 5. Hold this position for __________ seconds. Repeat __________ times. Complete this stretch __________ times a day. Strengthening exercises These exercises build strength and endurance in your hip and pelvis. Endurance is the ability to use your muscles for a long time, even after they get tired. Exercise C: Straight leg raises ( hip abductors) 1. Lie on your side with your left / right leg in the top position. Lie so your head, shoulder, knee, and hip line up. You may bend your bottom knee to help you balance. 2. Roll your hips slightly forward so your hips are stacked directly over each other and your left / right knee is facing forward. 3. Tense the muscles in your outer thigh and lift your top leg 4-6 inches (10-15 cm). 4. Hold this position for __________ seconds. 5. Slowly return to the starting position. Let your muscles relax completely before doing another repetition. Repeat __________ times. Complete this exercise __________ times a day. Exercise D: Straight leg raises ( hip extensors) 1. Lie on your abdomen on your bed or a firm surface. You can put a pillow under your hips if that is more comfortable. 2. Bend your left / right knee so your foot  is straight up in the air. 3. Squeeze your buttock muscles and lift your left / right thigh off the bed. Do not let your back arch. 4. Tense this muscle as hard as you can without increasing any knee pain. 5. Hold this position for __________ seconds. 6. Slowly lower your leg to the starting position and allow it to relax completely. Repeat __________ times. Complete this exercise __________ times a day. Exercise E: Hip hike 1. Stand sideways on a bottom step. Stand on your left / right leg with your other foot unsupported next to the step. You can hold onto the railing or wall if needed for balance. 2. Keep your knees straight and your torso square. Then, lift your left / right hip up toward the ceiling. 3. Slowly let your left / right hip lower toward the floor, past the starting position. Your foot should get closer to the floor. Do not lean or bend your knees. Repeat __________ times. Complete this exercise __________ times a day. This information is not intended to replace advice given to you by your health care provider. Make sure you discuss any questions you have with your health care provider. Document Released: 10/14/2005 Document Revised: 06/18/2016 Document Reviewed: 09/15/2015 Elsevier Interactive Patient Education  [employer].

## 2018-04-01 ENCOUNTER — Other Ambulatory Visit: Payer: Self-pay | Admitting: Physician Assistant

## 2018-04-01 NOTE — Telephone Encounter (Signed)
ok 

## 2018-04-01 NOTE — Telephone Encounter (Signed)
Last Visit: 03/06/18 Next Visit: 09/08/18  Okay to refill Restoril?  

## 2018-05-10 ENCOUNTER — Other Ambulatory Visit: Payer: Self-pay | Admitting: Rheumatology

## 2018-05-11 NOTE — Telephone Encounter (Addendum)
Last Visit: 03/06/18 Next Visit: 09/08/18 Labs: 09/02/17 stable  Left message to advise patient she is due for labs.  Okay to refill 30 day supply per Dr. Estanislado Pandy

## 2018-05-13 ENCOUNTER — Telehealth: Payer: Self-pay | Admitting: Rheumatology

## 2018-05-13 ENCOUNTER — Other Ambulatory Visit: Payer: Self-pay | Admitting: Rheumatology

## 2018-05-13 NOTE — Telephone Encounter (Signed)
Per patient [facility] on [address] may send a request for refill on Meloxicam, but patient asked them to hold that request. She still has plenty as of now.

## 2018-06-15 ENCOUNTER — Other Ambulatory Visit: Payer: Self-pay | Admitting: Rheumatology

## 2018-06-15 NOTE — Telephone Encounter (Signed)
Last Visit: 03/06/18 Next Visit: 09/08/18  Okay to refill per Dr. Estanislado Pandy

## 2018-06-16 ENCOUNTER — Other Ambulatory Visit: Payer: Self-pay | Admitting: Family Medicine

## 2018-06-16 ENCOUNTER — Ambulatory Visit
Admission: RE | Admit: 2018-06-16 | Discharge: 2018-06-16 | Disposition: A | Discharge status: Home / Self Care | Payer: Medicare Other | Source: Ambulatory Visit | Attending: Family Medicine | Admitting: Family Medicine

## 2018-06-16 DIAGNOSIS — M436 Torticollis: Secondary | ICD-10-CM

## 2018-06-16 DIAGNOSIS — M542 Cervicalgia: Secondary | ICD-10-CM

## 2018-07-08 ENCOUNTER — Other Ambulatory Visit: Payer: Self-pay | Admitting: Rheumatology

## 2018-07-08 ENCOUNTER — Telehealth: Payer: Self-pay | Admitting: Rheumatology

## 2018-07-08 MED ORDER — TEMAZEPAM 15 MG PO CAPS: ORAL_CAPSULE | ORAL | 0 refills | Status: DC

## 2018-07-08 NOTE — Telephone Encounter (Signed)
Last Visit: 03/06/18 Next Visit: 09/08/18  Okay to refill Restoril?

## 2018-07-08 NOTE — Telephone Encounter (Signed)
Attempted to contact patient and left message to advise patient we have not received anything from the pharmacy regarding the prescription and that we have faxed prescription refill over to the pharmacy.

## 2018-07-08 NOTE — Telephone Encounter (Signed)
Patient left a voicemail stating she was returning your call.   

## 2018-07-08 NOTE — Telephone Encounter (Signed)
Patient advised her prescription has been sent to the pharmacy.

## 2018-07-08 NOTE — Telephone Encounter (Signed)
Patient called stating that the pharmacy [facility] on [address]) faxed over a prescription refill request for the patient's Temazepam and the pharmacy stated that they have not received anything back from our office.  Patient's CB#[phone].  Thank you.

## 2018-07-08 NOTE — Telephone Encounter (Signed)
ok 

## 2018-08-25 NOTE — Progress Notes (Deleted)
Office Visit Note  Patient: [name]             Date of Birth: 11-27-1950           MRN: [id number]             PCP: [name], MD Referring: [name], MD Visit Date: 09/08/2018 Occupation: @GUAROCC @  Subjective:  No chief complaint on file.   History of Present Illness: [name] is a 67 y.o. female ***   Activities of Daily Living:  Patient reports morning stiffness for *** {minute/hour:19697}.   Patient {ACTIONS;DENIES/REPORTS:21021675::"Denies"} nocturnal pain.  Difficulty dressing/grooming: {ACTIONS;DENIES/REPORTS:21021675::"Denies"} Difficulty climbing stairs: {ACTIONS;DENIES/REPORTS:21021675::"Denies"} Difficulty getting out of chair: {ACTIONS;DENIES/REPORTS:21021675::"Denies"} Difficulty using hands for taps, buttons, cutlery, and/or writing: {ACTIONS;DENIES/REPORTS:21021675::"Denies"}  No Rheumatology ROS completed.   PMFS History:  Patient Active Problem List   Diagnosis Date Noted  . Fibromyalgia 09/04/2017  . Primary insomnia 09/04/2017  . DDD (degenerative disc disease), cervical 09/04/2017  . DDD (degenerative disc disease), lumbar 09/04/2017  . Primary osteoarthritis of both hands 09/04/2017  . Primary osteoarthritis of both knees 09/04/2017  . History of rotator cuff tear 09/04/2017  . Osteopenia of multiple sites 09/04/2017  . History of diabetes mellitus 09/04/2017  . History of hypertension 09/04/2017  . History of hypercholesterolemia 09/04/2017    Past Medical History:  Diagnosis Date  . Anxiety   . Arthritis   . Asthma   . Depression   . Diabetes mellitus without complication ([place])   . Fibromyalgia   . GERD (gastroesophageal reflux disease)   . Hyperlipemia   . Hypertension   . PONV (postoperative nausea and vomiting)     Family History  Problem Relation Age of Onset  . Diabetes Mother   . Heart attack Father   . Diabetes Sister   . Throat cancer Brother   . Hypertension Daughter    Past Surgical History:   Procedure Laterality Date  . COLONOSCOPY    . DILATION AND CURETTAGE OF UTERUS    . KNEE ARTHROSCOPY  [id number]   left  . SHOULDER ARTHROSCOPY     left  . TRIGGER FINGER RELEASE  08/13/2012   Procedure: RELEASE TRIGGER FINGER/A-1 PULLEY;  Surgeon: [name]., MD;  Location: [facility];  Service: Orthopedics;  Laterality: Right;  long finger  . TUBAL LIGATION     Social History   Social History Narrative  . Not on file    Objective: Vital Signs: There were no vitals taken for this visit.   Physical Exam   Musculoskeletal Exam: ***  CDAI Exam: CDAI Score: Not documented Patient Global Assessment: Not documented; Provider Global Assessment: Not documented Swollen: Not documented; Tender: Not documented Joint Exam   Not documented   There is currently no information documented on the homunculus. Go to the Rheumatology activity and complete the homunculus joint exam.  Investigation: No additional findings.  Imaging: No results found.  Recent Labs: Lab Results  Component Value Date   WBC 5.8 03/06/2017   HGB 11.7 03/06/2017   PLT 310 03/06/2017   NA 141 03/06/2017   K 4.5 03/06/2017   CL 108 03/06/2017   CO2 25 03/06/2017   GLUCOSE 100 (H) 03/06/2017   BUN 15 03/06/2017   CREATININE 1.14 (H) 03/06/2017   BILITOT 0.3 03/06/2017   ALKPHOS 60 03/06/2017   AST 16 03/06/2017   ALT 11 03/06/2017   PROT 6.5 03/06/2017   ALBUMIN 3.8 03/06/2017   CALCIUM 9.0 03/06/2017  GFRAA 57 (L) 03/06/2017    Speciality Comments: No specialty comments available.  Procedures:  No procedures performed Allergies: Asa [aspirin]; Benadryl [diphenhydramine]; Darvon [propoxyphene]; Percocet [oxycodone-acetaminophen]; Sulfa antibiotics; Sulfamethoxazole; Talwin [pentazocine]; Tetracyclines & related; and Tetracycline   Assessment / Plan:     Visit Diagnoses: No diagnosis found.   Orders: No orders of the defined types were placed in this  encounter.  No orders of the defined types were placed in this encounter.   Face-to-face time spent with patient was *** minutes. Greater than 50% of time was spent in counseling and coordination of care.  Follow-Up Instructions: No follow-ups on file.   Earnestine Mealing, CMA  Note - This record has been created using Editor, commissioning.  Chart creation errors have been sought, but may not always  have been located. Such creation errors do not reflect on  the standard of medical care.

## 2018-09-08 ENCOUNTER — Ambulatory Visit: Payer: Medicare Other | Admitting: Rheumatology

## 2018-09-14 ENCOUNTER — Other Ambulatory Visit: Payer: Self-pay | Admitting: Rheumatology

## 2018-09-14 NOTE — Telephone Encounter (Signed)
Please schedule patient for a follow up visit. Patient due November 2019. Thanks! 

## 2018-09-14 NOTE — Telephone Encounter (Signed)
Last Visit: 03/06/18 Next Visit due November 2019. Message sent to the front to schedule patient   Okay to refill per Dr. Estanislado Pandy

## 2018-09-14 NOTE — Telephone Encounter (Signed)
LMOM for patient to call and schedule her follow-up appointment due in November.

## 2018-09-16 NOTE — Progress Notes (Signed)
Office Visit Note  Patient: [name]             Date of Birth: 1951-04-11           MRN: [id number]             PCP: [name], MD Referring: [name], MD Visit Date: 09/22/2018 Occupation: @GUAROCC @  Subjective:  Right trochanteric bursitis   History of Present Illness: [name] is a 67 y.o. female with history of fibromyalgia, osteoarthritis, and DDD.  She continues to have muscle aches and muscle tenderness due to fibromyalgia.  She states her pain is a 5/10.  She states she has started taking magnesium at bedtime, which has been helping with muscle cramps at night.  She has been sleeping better and continues to take Restoril 15 mg 1 tablet by mouth at bedtime. She states she has trapezius muscle tension and tenderness.  She has been going to PT for neck pain, which has been helping.  She has right trochanteric bursitis.    Activities of Daily Living:  Patient reports morning stiffness for 5 minutes.   Patient Reports nocturnal pain.  Difficulty dressing/grooming: Denies Difficulty climbing stairs: Denies Difficulty getting out of chair: Reports Difficulty using hands for taps, buttons, cutlery, and/or writing: Denies  Review of Systems  Constitutional: Positive for fatigue.  HENT: Positive for mouth dryness. Negative for mouth sores and nose dryness.   Eyes: Negative for pain, visual disturbance and dryness.  Respiratory: Negative for cough, hemoptysis, shortness of breath and difficulty breathing.   Cardiovascular: Negative for chest pain, palpitations, hypertension and swelling in legs/feet.  Gastrointestinal: Negative for blood in stool, constipation and diarrhea.  Endocrine: Negative for increased urination.  Genitourinary: Negative for painful urination.  Musculoskeletal: Positive for myalgias, morning stiffness, muscle tenderness and myalgias. Negative for arthralgias, joint pain, joint swelling and muscle weakness.  Skin: Negative for color  change, pallor, rash, hair loss, nodules/bumps, skin tightness, ulcers and sensitivity to sunlight.  Allergic/Immunologic: Negative for susceptible to infections.  Neurological: Negative for dizziness, numbness, headaches and weakness.  Hematological: Negative for swollen glands.  Psychiatric/Behavioral: Positive for sleep disturbance (Restoril). Negative for depressed mood. The patient is not nervous/anxious.     PMFS History:  Patient Active Problem List   Diagnosis Date Noted  . Fibromyalgia 09/04/2017  . Primary insomnia 09/04/2017  . DDD (degenerative disc disease), cervical 09/04/2017  . DDD (degenerative disc disease), lumbar 09/04/2017  . Primary osteoarthritis of both hands 09/04/2017  . Primary osteoarthritis of both knees 09/04/2017  . History of rotator cuff tear 09/04/2017  . Osteopenia of multiple sites 09/04/2017  . History of diabetes mellitus 09/04/2017  . History of hypertension 09/04/2017  . History of hypercholesterolemia 09/04/2017    Past Medical History:  Diagnosis Date  . Anxiety   . Arthritis   . Asthma   . Depression   . Diabetes mellitus without complication ([place])   . Fibromyalgia   . GERD (gastroesophageal reflux disease)   . Hyperlipemia   . Hypertension   . PONV (postoperative nausea and vomiting)     Family History  Problem Relation Age of Onset  . Diabetes Mother   . Heart attack Father   . Diabetes Sister   . Throat cancer Brother   . Hypertension Daughter    Past Surgical History:  Procedure Laterality Date  . COLONOSCOPY    . DILATION AND CURETTAGE OF UTERUS    . KNEE ARTHROSCOPY  [id number]   left  .  SHOULDER ARTHROSCOPY     left  . TRIGGER FINGER RELEASE  08/13/2012   Procedure: RELEASE TRIGGER FINGER/A-1 PULLEY;  Surgeon: [name]., MD;  Location: [facility];  Service: Orthopedics;  Laterality: Right;  long finger  . TUBAL LIGATION     Social History   Social History Narrative  . Not on file     Objective: Vital Signs: BP 117/77 (BP Location: Right Arm, Patient Position: Sitting, Cuff Size: Normal)   Pulse 92   Resp 13   Ht 5' 5.5" (1.664 m)   Wt 158 lb (71.7 kg)   BMI 25.89 kg/m    Physical Exam  Constitutional: She is oriented to person, place, and time. She appears well-developed and well-nourished.  HENT:  Head: Normocephalic and atraumatic.  Eyes: Conjunctivae and EOM are normal.  Neck: Normal range of motion.  Cardiovascular: Normal rate, regular rhythm, normal heart sounds and intact distal pulses.  Pulmonary/Chest: Effort normal and breath sounds normal.  Abdominal: Soft. Bowel sounds are normal.  Lymphadenopathy:    She has no cervical adenopathy.  Neurological: She is alert and oriented to person, place, and time.  Skin: Skin is warm and dry. Capillary refill takes less than 2 seconds.  Psychiatric: She has a normal mood and affect. Her behavior is normal.  Nursing note and vitals reviewed.    Musculoskeletal Exam: C-spine limited ROM.  Trapezius muscle spasms. Thoracic and lumbar spine good ROM.  No midline spinal tenderness.  No SI joint tenderness. Shoulder joints, elbow joints, wrist joints, MCPs, PIPs, and DIPs good ROM with no synovitis.  PIP and DIP synovial thickening consistent with osteoarthritis.  Hip joints, knee joints, ankle joints, MTPs, PIPs, and DIPs good with no synovitis. No warmth or effusion of knee joints.  No tenderness or swelling of ankle joints.   CDAI Exam: CDAI Score: Not documented Patient Global Assessment: Not documented; Provider Global Assessment: Not documented Swollen: Not documented; Tender: Not documented Joint Exam   Not documented   There is currently no information documented on the homunculus. Go to the Rheumatology activity and complete the homunculus joint exam.  Investigation: No additional findings.  Imaging: No results found.  Recent Labs: Lab Results  Component Value Date   WBC 5.8 03/06/2017   HGB  11.7 03/06/2017   PLT 310 03/06/2017   NA 141 03/06/2017   K 4.5 03/06/2017   CL 108 03/06/2017   CO2 25 03/06/2017   GLUCOSE 100 (H) 03/06/2017   BUN 15 03/06/2017   CREATININE 1.14 (H) 03/06/2017   BILITOT 0.3 03/06/2017   ALKPHOS 60 03/06/2017   AST 16 03/06/2017   ALT 11 03/06/2017   PROT 6.5 03/06/2017   ALBUMIN 3.8 03/06/2017   CALCIUM 9.0 03/06/2017   GFRAA 57 (L) 03/06/2017    Speciality Comments: No specialty comments available.  Procedures:  No procedures performed Allergies: Asa [aspirin]; Benadryl [diphenhydramine]; Darvon [propoxyphene]; Percocet [oxycodone-acetaminophen]; Sulfa antibiotics; Sulfamethoxazole; Talwin [pentazocine]; Tetracyclines & related; and Tetracycline   Assessment / Plan:     Visit Diagnoses: Fibromyalgia -She has generalized hyperalgesia and positive tender points on exam. She continues to have generalized muscle aches and muscle tenderness.  Her pain is currently a 5/10. She has right trochanteric bursitis.  She declined a cortisone injection today.  She was given a handout of exercises that she can perform at home.  She declined PT at this time. She continues to take baclofen 10 mg BID PRN for muscle spasms and meloxicam 7.5  mg 1 tablet by mouth PRN for pain relief.  She has chronic fatigue and insomnia.  She has been taking Restoril PRN at bedtime. A refill was given to the patient.  She will follow up in the office in 6 months.   Primary insomnia - She takes Temazepam 15 mg 1 tablet by mouth at bedtime for insomnia.  She has started taking Magnesium at bedtime, which has improved muscle cramps. She was given a refill of Restoril today.   Primary osteoarthritis of both hands: She has PIP and DIP synovial thickening consistent with osteoarthritis of both hands.  She has complete fist formation bilaterally.  No synovitis noted. Joint protection and muscle strengthening were discussed.   Primary osteoarthritis of both knees: No warmth or effusion of  knee joints.  Good ROM with no discomfort.   History of rotator cuff tear - left s/p repair in the past by Dr. Lorre Nick. Good ROM with no discomfort.    DDD (degenerative disc disease), cervical: She has slightly limited ROM with discomfort.  She has no symptoms of radiculopathy at this time.  She has been performing neck exercises and was going to physical therapy.  She was given a handout of additional neck exercises.   DDD (degenerative disc disease), lumbar: No midline spinal tenderness.  She has discomfort with ROM.  She was given a handout of back exercises that she can perform at home.   Other medical conditions are listed as follows:   Osteopenia of multiple sites  History of diabetes mellitus  History of hypercholesterolemia  History of hypertension   Orders: No orders of the defined types were placed in this encounter.  Meds ordered this encounter  Medications  . temazepam (RESTORIL) 15 MG capsule    Sig: Take 2 capsules by mouth at bedtime as needed.    Dispense:  90 capsule    Refill:  0    Follow-Up Instructions: Return in about 6 months (around 03/23/2019) for Fibromyalgia, Osteoarthritis, DDD.   Ofilia Neas, PA-C  Note - This record has been created using Dragon software.  Chart creation errors have been sought, but may not always  have been located. Such creation errors do not reflect on  the standard of medical care.

## 2018-09-22 ENCOUNTER — Encounter: Payer: Self-pay | Admitting: Physician Assistant

## 2018-09-22 ENCOUNTER — Ambulatory Visit: Payer: Medicare Other | Admitting: Physician Assistant

## 2018-09-22 VITALS — BP 117/77 | HR 92 | Resp 13 | Ht 65.5 in | Wt 158.0 lb

## 2018-09-22 DIAGNOSIS — M19041 Primary osteoarthritis, right hand: Secondary | ICD-10-CM

## 2018-09-22 DIAGNOSIS — M797 Fibromyalgia: Secondary | ICD-10-CM | POA: Diagnosis not present

## 2018-09-22 DIAGNOSIS — Z8639 Personal history of other endocrine, nutritional and metabolic disease: Secondary | ICD-10-CM

## 2018-09-22 DIAGNOSIS — F5101 Primary insomnia: Secondary | ICD-10-CM

## 2018-09-22 DIAGNOSIS — Z8679 Personal history of other diseases of the circulatory system: Secondary | ICD-10-CM

## 2018-09-22 DIAGNOSIS — M19042 Primary osteoarthritis, left hand: Secondary | ICD-10-CM

## 2018-09-22 DIAGNOSIS — M5030 Other cervical disc degeneration, unspecified cervical region: Secondary | ICD-10-CM

## 2018-09-22 DIAGNOSIS — M8589 Other specified disorders of bone density and structure, multiple sites: Secondary | ICD-10-CM

## 2018-09-22 DIAGNOSIS — M5136 Other intervertebral disc degeneration, lumbar region: Secondary | ICD-10-CM

## 2018-09-22 DIAGNOSIS — Z8739 Personal history of other diseases of the musculoskeletal system and connective tissue: Secondary | ICD-10-CM

## 2018-09-22 DIAGNOSIS — M170 Bilateral primary osteoarthritis of knee: Secondary | ICD-10-CM | POA: Diagnosis not present

## 2018-09-22 MED ORDER — TEMAZEPAM 15 MG PO CAPS: ORAL_CAPSULE | ORAL | 0 refills | Status: DC

## 2018-09-22 NOTE — Patient Instructions (Addendum)
Back Exercises If you have pain in your back, do these exercises 2-3 times each day or as told by your doctor. When the pain goes away, do the exercises once each day, but repeat the steps more times for each exercise (do more repetitions). If you do not have pain in your back, do these exercises once each day or as told by your doctor. Exercises Single Knee to Chest  Do these steps 3-5 times in a row for each leg: 1. Lie on your back on a firm bed or the floor with your legs stretched out. 2. Bring one knee to your chest. 3. Hold your knee to your chest by grabbing your knee or thigh. 4. Pull on your knee until you feel a gentle stretch in your lower back. 5. Keep doing the stretch for 10-30 seconds. 6. Slowly let go of your leg and straighten it.  Pelvic Tilt  Do these steps 5-10 times in a row: 1. Lie on your back on a firm bed or the floor with your legs stretched out. 2. Bend your knees so they point up to the ceiling. Your feet should be flat on the floor. 3. Tighten your lower belly (abdomen) muscles to press your lower back against the floor. This will make your tailbone point up to the ceiling instead of pointing down to your feet or the floor. 4. Stay in this position for 5-10 seconds while you gently tighten your muscles and breathe evenly.  Cat-Cow  Do these steps until your lower back bends more easily: 1. Get on your hands and knees on a firm surface. Keep your hands under your shoulders, and keep your knees under your hips. You may put padding under your knees. 2. Let your head hang down, and make your tailbone point down to the floor so your lower back is round like the back of a cat. 3. Stay in this position for 5 seconds. 4. Slowly lift your head and make your tailbone point up to the ceiling so your back hangs low (sags) like the back of a cow. 5. Stay in this position for 5 seconds.  Press-Ups  Do these steps 5-10 times in a row: 1. Lie on your belly (face-down)  on the floor. 2. Place your hands near your head, about shoulder-width apart. 3. While you keep your back relaxed and keep your hips on the floor, slowly straighten your arms to raise the top half of your body and lift your shoulders. Do not use your back muscles. To make yourself more comfortable, you may change where you place your hands. 4. Stay in this position for 5 seconds. 5. Slowly return to lying flat on the floor.  Bridges  Do these steps 10 times in a row: 1. Lie on your back on a firm surface. 2. Bend your knees so they point up to the ceiling. Your feet should be flat on the floor. 3. Tighten your butt muscles and lift your butt off of the floor until your waist is almost as high as your knees. If you do not feel the muscles working in your butt and the back of your thighs, slide your feet 1-2 inches farther away from your butt. 4. Stay in this position for 3-5 seconds. 5. Slowly lower your butt to the floor, and let your butt muscles relax.  If this exercise is too easy, try doing it with your arms crossed over your chest. Belly Crunches  Do these steps 5-10 times in   a row: 1. Lie on your back on a firm bed or the floor with your legs stretched out. 2. Bend your knees so they point up to the ceiling. Your feet should be flat on the floor. 3. Cross your arms over your chest. 4. Tip your chin a little bit toward your chest but do not bend your neck. 5. Tighten your belly muscles and slowly raise your chest just enough to lift your shoulder blades a tiny bit off of the floor. 6. Slowly lower your chest and your head to the floor.  Back Lifts Do these steps 5-10 times in a row: 1. Lie on your belly (face-down) with your arms at your sides, and rest your forehead on the floor. 2. Tighten the muscles in your legs and your butt. 3. Slowly lift your chest off of the floor while you keep your hips on the floor. Keep the back of your head in line with the curve in your back. Look at  the floor while you do this. 4. Stay in this position for 3-5 seconds. 5. Slowly lower your chest and your face to the floor.  Contact a doctor if:  Your back pain gets a lot worse when you do an exercise.  Your back pain does not lessen 2 hours after you exercise. If you have any of these problems, stop doing the exercises. Do not do them again unless your doctor says it is okay. Get help right away if:  You have sudden, very bad back pain. If this happens, stop doing the exercises. Do not do them again unless your doctor says it is okay. This information is not intended to replace advice given to you by your health care provider. Make sure you discuss any questions you have with your health care provider. Document Released: 11/16/2010 Document Revised: 03/21/2016 Document Reviewed: 12/08/2014 Elsevier Interactive Patient Education  2018 [place]. Neck Exercises Neck exercises can be important for many reasons:  They can help you to improve and maintain flexibility in your neck. This can be especially important as you age.  They can help to make your neck stronger. This can make movement easier.  They can reduce or prevent neck pain.  They may help your upper back.  Ask your health care provider which neck exercises would be best for you. Exercises Neck Press Repeat this exercise 10 times. Do it first thing in the morning and right before bed or as told by your health care provider. 1. Lie on your back on a firm bed or on the floor with a pillow under your head. 2. Use your neck muscles to push your head down on the pillow and straighten your spine. 3. Hold the position as well as you can. Keep your head facing up and your chin tucked. 4. Slowly count to 5 while holding this position. 5. Relax for a few seconds. Then repeat.  Isometric Strengthening Do a full set of these exercises 2 times a day or as told by your health care provider. 1. Sit in a supportive chair and  place your hand on your forehead. 2. Push forward with your head and neck while pushing back with your hand. Hold for 10 seconds. 3. Relax. Then repeat the exercise 3 times. 4. Next, do thesequence again, this time putting your hand against the back of your head. Use your head and neck to push backward against the hand pressure. 5. Finally, do the same exercise on either side of your head,  pushing sideways against the pressure of your hand.  Prone Head Lifts Repeat this exercise 5 times. Do this 2 times a day or as told by your health care provider. 1. Lie face-down, resting on your elbows so that your chest and upper back are raised. 2. Start with your head facing downward, near your chest. Position your chin either on or near your chest. 3. Slowly lift your head upward. Lift until you are looking straight ahead. Then continue lifting your head as far back as you can stretch. 4. Hold your head up for 5 seconds. Then slowly lower it to your starting position.  Supine Head Lifts Repeat this exercise 8-10 times. Do this 2 times a day or as told by your health care provider. 1. Lie on your back, bending your knees to point to the ceiling and keeping your feet flat on the floor. 2. Lift your head slowly off the floor, raising your chin toward your chest. 3. Hold for 5 seconds. 4. Relax and repeat.  Scapular Retraction Repeat this exercise 5 times. Do this 2 times a day or as told by your health care provider. 1. Stand with your arms at your sides. Look straight ahead. 2. Slowly pull both shoulders backward and downward until you feel a stretch between your shoulder blades in your upper back. 3. Hold for 10-30 seconds. 4. Relax and repeat.  Contact a health care provider if:  Your neck pain or discomfort gets much worse when you do an exercise.  Your neck pain or discomfort does not improve within 2 hours after you exercise. If you have any of these problems, stop exercising right away.  Do not do the exercises again unless your health care provider says that you can. Get help right away if:  You develop sudden, severe neck pain. If this happens, stop exercising right away. Do not do the exercises again unless your health care provider says that you can. Exercises Neck Stretch  Repeat this exercise 3-5 times. 1. Do this exercise while standing or while sitting in a chair. 2. Place your feet flat on the floor, shoulder-width apart. 3. Slowly turn your head to the right. Turn it all the way to the right so you can look over your right shoulder. Do not tilt or tip your head. 4. Hold this position for 10-30 seconds. 5. Slowly turn your head to the left, to look over your left shoulder. 6. Hold this position for 10-30 seconds.  Neck Retraction Repeat this exercise 8-10 times. Do this 3-4 times a day or as told by your health care provider. 1. Do this exercise while standing or while sitting in a sturdy chair. 2. Look straight ahead. Do not bend your neck. 3. Use your fingers to push your chin backward. Do not bend your neck for this movement. Continue to face straight ahead. If you are doing the exercise properly, you will feel a slight sensation in your throat and a stretch at the back of your neck. 4. Hold the stretch for 1-2 seconds. Relax and repeat.  This information is not intended to replace advice given to you by your health care provider. Make sure you discuss any questions you have with your health care provider. Document Released: 09/25/2015 Document Revised: 03/21/2016 Document Reviewed: 04/24/2015 Elsevier Interactive Patient Education  2018 [place]. Trochanteric Bursitis Rehab Ask your health care provider which exercises are safe for you. Do exercises exactly as told by your health care provider and adjust them as  directed. It is normal to feel mild stretching, pulling, tightness, or discomfort as you do these exercises, but you should stop right away if you  feel sudden pain or your pain gets worse.Do not begin these exercises until told by your health care provider. Stretching exercises These exercises warm up your muscles and joints and improve the movement and flexibility of your hip. These exercises also help to relieve pain and stiffness. Exercise A: Iliotibial band stretch  1. Lie on your side with your left / right leg in the top position. 2. Bend your left / right knee and grab your ankle. 3. Slowly bring your knee back so your thigh is behind your body. 4. Slowly lower your knee toward the floor until you feel a gentle stretch on the outside of your left / right thigh. If you do not feel a stretch and your knee will not fall farther, place the heel of your other foot on top of your outer knee and pull your thigh down farther. 5. Hold this position for __________ seconds. 6. Slowly return to the starting position. Repeat __________ times. Complete this exercise __________ times a day. Strengthening exercises These exercises build strength and endurance in your hip and pelvis. Endurance is the ability to use your muscles for a long time, even after they get tired. Exercise B: Bridge ( hip extensors) 1. Lie on your back on a firm surface with your knees bent and your feet flat on the floor. 2. Tighten your buttocks muscles and lift your buttocks off the floor until your trunk is level with your thighs. You should feel the muscles working in your buttocks and the back of your thighs. If this exercise is too easy, try doing it with your arms crossed over your chest. 3. Hold this position for __________ seconds. 4. Slowly return to the starting position. 5. Let your muscles relax completely between repetitions. Repeat __________ times. Complete this exercise __________ times a day. Exercise C: Squats ( knee extensors and  quadriceps) 1. Stand in front of a table, with your feet and knees pointing straight ahead. You may rest your hands on the  table for balance but not for support. 2. Slowly bend your knees and lower your hips like you are going to sit in a chair. ? Keep your weight over your heels, not over your toes. ? Keep your lower legs upright so they are parallel with the table legs. ? Do not let your hips go lower than your knees. ? Do not bend lower than told by your health care provider. ? If your hip pain increases, do not bend as low. 3. Hold this position for __________ seconds. 4. Slowly push with your legs to return to standing. Do not use your hands to pull yourself to standing. Repeat __________ times. Complete this exercise __________ times a day. Exercise D: Hip hike 1. Stand sideways on a bottom step. Stand on your left / right leg with your other foot unsupported next to the step. You can hold onto the railing or wall if needed for balance. 2. Keeping your knees straight and your torso square, lift your left / right hip up toward the ceiling. 3. Hold this position for __________ seconds. 4. Slowly let your left / right hip lower toward the floor, past the starting position. Your foot should get closer to the floor. Do not lean or bend your knees. Repeat __________ times. Complete this exercise __________ times a day. Exercise E: Single leg  stand 1. Stand near a counter or door frame that you can hold onto for balance as needed. It is helpful to stand in front of a mirror for this exercise so you can watch your hip. 2. Squeeze your left / right buttock muscles then lift up your other foot. Do not let your left / right hip push out to the side. 3. Hold this position for __________ seconds. Repeat __________ times. Complete this exercise __________ times a day. This information is not intended to replace advice given to you by your health care provider. Make sure you discuss any questions you have with your health care provider. Document Released: 11/21/2004 Document Revised: 06/20/2016 Document Reviewed:  09/29/2015 Elsevier Interactive Patient Education  [employer].

## 2018-12-09 ENCOUNTER — Other Ambulatory Visit: Payer: Self-pay | Admitting: *Deleted

## 2018-12-09 MED ORDER — TEMAZEPAM 15 MG PO CAPS: ORAL_CAPSULE | ORAL | 0 refills | Status: DC

## 2018-12-09 NOTE — Telephone Encounter (Signed)
Okay to refill per Dr. Deveshwar.  

## 2018-12-09 NOTE — Telephone Encounter (Signed)
ok 

## 2018-12-09 NOTE — Telephone Encounter (Signed)
Patient stopped by the office requesting a prescription refill on Restoril  Last Visit: 09/23/19 Next visit: 03/23/19  Okay to refill Restoril?

## 2018-12-12 ENCOUNTER — Other Ambulatory Visit: Payer: Self-pay | Admitting: Rheumatology

## 2018-12-14 NOTE — Telephone Encounter (Signed)
Last Visit: 09/23/19 Next visit: 03/23/19  Okay to refill per Dr. Estanislado Pandy

## 2019-01-06 ENCOUNTER — Other Ambulatory Visit: Payer: Self-pay | Admitting: Rheumatology

## 2019-01-06 ENCOUNTER — Telehealth: Payer: Self-pay | Admitting: Rheumatology

## 2019-01-06 NOTE — Telephone Encounter (Signed)
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  Patient states she has looked everywhere and is unable to find her bottle.  Patient states this has never happened to her before and is embarrassed that she lost her prescription.  Patient is requesting refill if possible.

## 2019-01-06 NOTE — Telephone Encounter (Signed)
Per Dr.Deveshwar unable to refill until next refill is due.   Attempted to contact the patient and left message for patient to call the office.

## 2019-01-07 ENCOUNTER — Telehealth: Payer: Self-pay | Admitting: Rheumatology

## 2019-01-07 NOTE — Telephone Encounter (Signed)
Attempted to contact patient and left message for patient to call the office.  

## 2019-01-07 NOTE — Telephone Encounter (Signed)
Patient left a voicemail stating she was returning your call.   

## 2019-01-12 ENCOUNTER — Telehealth: Payer: Self-pay | Admitting: Rheumatology

## 2019-01-12 NOTE — Telephone Encounter (Signed)
Attempted to contact the patient and left message for patient to call the office.  

## 2019-01-12 NOTE — Telephone Encounter (Signed)
Patient would like to talk to someone about medication refill. Per patient, she has never lost medication before, she cannot get out, and is a nervous wreak right now. Please call to advise.

## 2019-01-12 NOTE — Telephone Encounter (Signed)
Patient stopped by the office and has been advised we are unable to refill the Restoril until refill is due.

## 2019-01-28 ENCOUNTER — Other Ambulatory Visit: Payer: Self-pay | Admitting: Rheumatology

## 2019-01-28 ENCOUNTER — Other Ambulatory Visit: Payer: Self-pay | Admitting: Family Medicine

## 2019-01-28 ENCOUNTER — Other Ambulatory Visit: Payer: Self-pay

## 2019-01-28 ENCOUNTER — Ambulatory Visit
Admission: RE | Admit: 2019-01-28 | Discharge: 2019-01-28 | Disposition: A | Discharge status: Home / Self Care | Payer: Medicare Other | Source: Ambulatory Visit | Attending: Family Medicine | Admitting: Family Medicine

## 2019-01-28 DIAGNOSIS — M79604 Pain in right leg: Secondary | ICD-10-CM

## 2019-01-28 NOTE — Telephone Encounter (Signed)
Last Visit: 09/23/19 Next visit: 03/23/19  Okay to refill Restoril?

## 2019-02-26 ENCOUNTER — Other Ambulatory Visit: Payer: Self-pay | Admitting: Physician Assistant

## 2019-03-01 NOTE — Telephone Encounter (Signed)
Last Visit: 09/22/2018 Next Visit: 03/23/2019  Last fill: 01/28/2019  Okay to refill restoril?

## 2019-03-10 NOTE — Progress Notes (Deleted)
Office Visit Note  Patient: [name]             Date of Birth: 02/10/1951           MRN: [id number]             PCP: [name], MD Referring: [name], MD Visit Date: 03/23/2019 Occupation: @GUAROCC @  Subjective:  No chief complaint on file.   History of Present Illness: [name] is a 68 y.o. female ***   Activities of Daily Living:  Patient reports morning stiffness for *** {minute/hour:19697}.   Patient {ACTIONS;DENIES/REPORTS:21021675::"Denies"} nocturnal pain.  Difficulty dressing/grooming: {ACTIONS;DENIES/REPORTS:21021675::"Denies"} Difficulty climbing stairs: {ACTIONS;DENIES/REPORTS:21021675::"Denies"} Difficulty getting out of chair: {ACTIONS;DENIES/REPORTS:21021675::"Denies"} Difficulty using hands for taps, buttons, cutlery, and/or writing: {ACTIONS;DENIES/REPORTS:21021675::"Denies"}  No Rheumatology ROS completed.   PMFS History:  Patient Active Problem List   Diagnosis Date Noted  . Fibromyalgia 09/04/2017  . Primary insomnia 09/04/2017  . DDD (degenerative disc disease), cervical 09/04/2017  . DDD (degenerative disc disease), lumbar 09/04/2017  . Primary osteoarthritis of both hands 09/04/2017  . Primary osteoarthritis of both knees 09/04/2017  . History of rotator cuff tear 09/04/2017  . Osteopenia of multiple sites 09/04/2017  . History of diabetes mellitus 09/04/2017  . History of hypertension 09/04/2017  . History of hypercholesterolemia 09/04/2017    Past Medical History:  Diagnosis Date  . Anxiety   . Arthritis   . Asthma   . Depression   . Diabetes mellitus without complication ([place])   . Fibromyalgia   . GERD (gastroesophageal reflux disease)   . Hyperlipemia   . Hypertension   . PONV (postoperative nausea and vomiting)     Family History  Problem Relation Age of Onset  . Diabetes Mother   . Heart attack Father   . Diabetes Sister   . Throat cancer Brother   . Hypertension Daughter    Past Surgical History:   Procedure Laterality Date  . COLONOSCOPY    . DILATION AND CURETTAGE OF UTERUS    . KNEE ARTHROSCOPY  [id number]   left  . SHOULDER ARTHROSCOPY     left  . TRIGGER FINGER RELEASE  08/13/2012   Procedure: RELEASE TRIGGER FINGER/A-1 PULLEY;  Surgeon: [name]., MD;  Location: [place] [facility];  Service: Orthopedics;  Laterality: Right;  long finger  . TUBAL LIGATION     Social History   Social History Narrative  . Not on file    There is no immunization history on file for this patient.   Objective: Vital Signs: There were no vitals taken for this visit.   Physical Exam   Musculoskeletal Exam: ***  CDAI Exam: CDAI Score: Not documented Patient Global Assessment: Not documented; Provider Global Assessment: Not documented Swollen: Not documented; Tender: Not documented Joint Exam   Not documented   There is currently no information documented on the homunculus. Go to the Rheumatology activity and complete the homunculus joint exam.  Investigation: No additional findings.  Imaging: No results found.  Recent Labs: Lab Results  Component Value Date   WBC 5.8 03/06/2017   HGB 11.7 03/06/2017   PLT 310 03/06/2017   NA 141 03/06/2017   K 4.5 03/06/2017   CL 108 03/06/2017   CO2 25 03/06/2017   GLUCOSE 100 (H) 03/06/2017   BUN 15 03/06/2017   CREATININE 1.14 (H) 03/06/2017   BILITOT 0.3 03/06/2017   ALKPHOS 60 03/06/2017   AST 16 03/06/2017   ALT 11 03/06/2017   PROT 6.5  03/06/2017   ALBUMIN 3.8 03/06/2017   CALCIUM 9.0 03/06/2017   GFRAA 57 (L) 03/06/2017    Speciality Comments: No specialty comments available.  Procedures:  No procedures performed Allergies: Asa [aspirin]; Benadryl [diphenhydramine]; Darvon [propoxyphene]; Percocet [oxycodone-acetaminophen]; Sulfa antibiotics; Sulfamethoxazole; Talwin [pentazocine]; Tetracyclines & related; and Tetracycline   Assessment / Plan:     Visit Diagnoses: No diagnosis found.   Orders: No  orders of the defined types were placed in this encounter.  No orders of the defined types were placed in this encounter.   Face-to-face time spent with patient was *** minutes. Greater than 50% of time was spent in counseling and coordination of care.  Follow-Up Instructions: No follow-ups on file.   Ofilia Neas, PA-C  Note - This record has been created using Dragon software.  Chart creation errors have been sought, but may not always  have been located. Such creation errors do not reflect on  the standard of medical care.

## 2019-03-23 ENCOUNTER — Ambulatory Visit: Payer: Self-pay | Admitting: Physician Assistant

## 2019-04-02 ENCOUNTER — Other Ambulatory Visit: Payer: Self-pay

## 2019-04-02 ENCOUNTER — Emergency Department (HOSPITAL_COMMUNITY): Payer: Medicare Other

## 2019-04-02 ENCOUNTER — Encounter (HOSPITAL_COMMUNITY): Payer: Self-pay | Admitting: Emergency Medicine

## 2019-04-02 ENCOUNTER — Emergency Department (HOSPITAL_COMMUNITY)
Admission: EM | Admit: 2019-04-02 | Discharge: 2019-04-02 | Disposition: A | Discharge status: Home / Self Care | Payer: Medicare Other | Attending: Emergency Medicine | Admitting: Emergency Medicine

## 2019-04-02 DIAGNOSIS — I10 Essential (primary) hypertension: Secondary | ICD-10-CM | POA: Diagnosis not present

## 2019-04-02 DIAGNOSIS — R0789 Other chest pain: Secondary | ICD-10-CM | POA: Diagnosis present

## 2019-04-02 DIAGNOSIS — Z794 Long term (current) use of insulin: Secondary | ICD-10-CM | POA: Diagnosis not present

## 2019-04-02 DIAGNOSIS — R059 Cough, unspecified: Secondary | ICD-10-CM

## 2019-04-02 DIAGNOSIS — R739 Hyperglycemia, unspecified: Secondary | ICD-10-CM

## 2019-04-02 DIAGNOSIS — R0781 Pleurodynia: Secondary | ICD-10-CM

## 2019-04-02 DIAGNOSIS — R05 Cough: Secondary | ICD-10-CM | POA: Insufficient documentation

## 2019-04-02 DIAGNOSIS — J45909 Unspecified asthma, uncomplicated: Secondary | ICD-10-CM | POA: Insufficient documentation

## 2019-04-02 DIAGNOSIS — E1165 Type 2 diabetes mellitus with hyperglycemia: Secondary | ICD-10-CM | POA: Diagnosis not present

## 2019-04-02 DIAGNOSIS — Z87891 Personal history of nicotine dependence: Secondary | ICD-10-CM | POA: Diagnosis not present

## 2019-04-02 DIAGNOSIS — Z79899 Other long term (current) drug therapy: Secondary | ICD-10-CM | POA: Diagnosis not present

## 2019-04-02 LAB — URINALYSIS, ROUTINE W REFLEX MICROSCOPIC
Bilirubin Urine: NEGATIVE
Glucose, UA: >=500 mg/dL — AB
Hgb urine dipstick: NEGATIVE
Ketones, ur: NEGATIVE mg/dL
Leukocytes,Ua: NEGATIVE
Nitrite: NEGATIVE
Protein, ur: NEGATIVE mg/dL
Specific Gravity, Urine: 1.014 (ref 1.005–1.030)
pH: 5 (ref 5.0–8.0)

## 2019-04-02 LAB — CBC WITH DIFFERENTIAL/PLATELET
Abs Immature Granulocytes: 0.03 10*3/uL (ref 0.00–0.07)
Basophils Absolute: 0 10*3/uL (ref 0.0–0.1)
Basophils Relative: 0 %
Eosinophils Absolute: 0 10*3/uL (ref 0.0–0.5)
Eosinophils Relative: 0 %
HCT: 35 % — ABNORMAL LOW (ref 36.0–46.0)
Hemoglobin: 11.5 g/dL — ABNORMAL LOW (ref 12.0–15.0)
Immature Granulocytes: 0 %
Lymphocytes Relative: 27 %
Lymphs Abs: 2.8 10*3/uL (ref 0.7–4.0)
MCH: 27 pg (ref 26.0–34.0)
MCHC: 32.9 g/dL (ref 30.0–36.0)
MCV: 82.2 fL (ref 80.0–100.0)
Monocytes Absolute: 0.7 10*3/uL (ref 0.1–1.0)
Monocytes Relative: 7 %
Neutro Abs: 6.9 10*3/uL (ref 1.7–7.7)
Neutrophils Relative %: 66 %
Platelets: 250 10*3/uL (ref 150–400)
RBC: 4.26 MIL/uL (ref 3.87–5.11)
RDW: 13.3 % (ref 11.5–15.5)
WBC: 10.5 10*3/uL (ref 4.0–10.5)
nRBC: 0 % (ref 0.0–0.2)

## 2019-04-02 LAB — BASIC METABOLIC PANEL
Anion gap: 8 (ref 5–15)
BUN: 33 mg/dL — ABNORMAL HIGH (ref 8–23)
CO2: 24 mmol/L (ref 22–32)
Calcium: 8.7 mg/dL — ABNORMAL LOW (ref 8.9–10.3)
Chloride: 103 mmol/L (ref 98–111)
Creatinine, Ser: 1.17 mg/dL — ABNORMAL HIGH (ref 0.44–1.00)
GFR calc Af Amer: 55 mL/min — ABNORMAL LOW (ref >60)
GFR calc non Af Amer: 47 mL/min — ABNORMAL LOW (ref >60)
Glucose, Bld: 357 mg/dL — ABNORMAL HIGH (ref 70–99)
Potassium: 3.8 mmol/L (ref 3.5–5.1)
Sodium: 135 mmol/L (ref 135–145)

## 2019-04-02 LAB — TROPONIN I: Troponin I: <0.03 ng/mL (ref <0.03)

## 2019-04-02 MED ORDER — OXYCODONE-ACETAMINOPHEN 5-325 MG PO TABS: 1.0000 | ORAL_TABLET | Freq: Four times a day (QID) | ORAL | 0 refills | Status: DC | PRN

## 2019-04-02 MED ORDER — KETOROLAC TROMETHAMINE 30 MG/ML IJ SOLN
15.0000 mg | Freq: Once | INTRAMUSCULAR | Status: AC
  Administered 2019-04-02: 03:00:00 15 mg via INTRAVENOUS
  Filled 2019-04-02: qty 1

## 2019-04-02 NOTE — ED Triage Notes (Signed)
Patient is complaining of back pain that started yesterday. Patient states she has asthma and has been prescribed a nebulizer. She states the nebulizer makes her cough. She states the coughing is making her back hurt bad. She states she had the covid test and it was negative.

## 2019-04-02 NOTE — ED Provider Notes (Signed)
TIME SEEN: 2:06 AM  CHIEF COMPLAINT: Bilateral rib pain, cough  HPI: Patient is a 68 year old female with history of hypertension, diabetes, hyperlipidemia, fibromyalgia, asthma who presents to the emergency department with bilateral rib pain.  States that on Wednesday, May 27 she began to feel poorly and had a dry cough.  States she was seen by her primary care doctor on Monday, June 1.  At that time she was swabbed for coronavirus which was negative.  States she was given a breathing treatment and discharged on doxycycline, albuterol, steroids.  States she is almost done with her steroids.  States she was also given a prescription for cough medicine with hydrocodone.  She is chronically on Restoril for restless legs, baclofen for muscle spasms, meloxicam for chronic pain, temazepam for insomnia.  States that she has been coughing so hard that she has bilateral rib pain now.  No numbness, tingling or focal weakness.  No anterior chest pain.  No bowel or bladder incontinence.  No fevers or chills.  No sick contacts.  Reports she lives at home alone.  ROS: See HPI Constitutional: no fever  Eyes: no drainage  ENT: no runny nose   Cardiovascular: Bilateral chest pain  Resp: no SOB  GI: no vomiting GU: no dysuria Integumentary: no rash  Allergy: no hives  Musculoskeletal: no leg swelling  Neurological: no slurred speech ROS otherwise negative  PAST MEDICAL HISTORY/PAST SURGICAL HISTORY:  Past Medical History:  Diagnosis Date  . Anxiety   . Arthritis   . Asthma   . Depression   . Diabetes mellitus without complication ([place])   . Fibromyalgia   . GERD (gastroesophageal reflux disease)   . Hyperlipemia   . Hypertension   . PONV (postoperative nausea and vomiting)     MEDICATIONS:  Prior to Admission medications   Medication Sig Start Date End Date Taking? Authorizing Provider  ACCU-CHEK AVIVA PLUS test strip  06/30/13   [provider]  albuterol (PROVENTIL HFA;VENTOLIN HFA) 108  (90 BASE) MCG/ACT inhaler Inhale 2 puffs into the lungs every 6 (six) hours as needed.    [provider]  baclofen (LIORESAL) 10 MG tablet TAKE 1 TABLET(10 MG) BY MOUTH TWICE DAILY AS NEEDED 12/14/18   Bo Merino, MD  BD PEN NEEDLE NANO U/F 32G X 4 MM MISC  06/30/13   [provider]  cetirizine (ZYRTEC ALLERGY) 10 MG tablet Take 10 mg by mouth daily.    [provider]  escitalopram (LEXAPRO) 10 MG tablet Take 10 mg by mouth daily.    [provider]  insulin glargine (LANTUS) 100 UNIT/ML injection Inject 20 Units into the skin at bedtime.    [provider]  losartan-hydrochlorothiazide (HYZAAR) 50-12.5 MG per tablet Take 1 tablet by mouth daily.    [provider]  Magnesium 250 MG TABS Take by mouth every other day.    [provider]  meloxicam (MOBIC) 7.5 MG tablet TAKE 1 TABLET(7.5 MG) BY MOUTH DAILY, as needed 05/13/18   Bo Merino, MD  pantoprazole (PROTONIX) 40 MG tablet Take 40 mg by mouth daily.    [provider]  repaglinide (PRANDIN) 2 MG tablet Take 2 mg by mouth 2 (two) times daily before a meal.    [provider]  simvastatin (ZOCOR) 40 MG tablet Take 40 mg by mouth every evening.    [provider]  temazepam (RESTORIL) 15 MG capsule TAKE 2 CAPSULES BY MOUTH AT BEDTIME AS NEEDED 03/01/19   Hazel Sams  M, PA-C  Vitamin D, Ergocalciferol, (DRISDOL) 50000 units CAPS capsule Take 1 capsule (50,000 Units total) by mouth 2 (two) times a week. 03/27/17   Panwala, Jodelle Green, PA-C    ALLERGIES:  Allergies  Allergen Reactions  . Asa [Aspirin] Nausea And Vomiting  . Benadryl [Diphenhydramine]     Can only take dye free  . Darvon [Propoxyphene] Itching    Can only during the day. Night time makes her itch  . Percocet [Oxycodone-Acetaminophen]     itching  . Sulfa Antibiotics Hives  . Sulfamethoxazole Itching  . Talwin [Pentazocine]     nervous  . Tetracyclines & Related Hives  .  Tetracycline Rash    SOCIAL HISTORY:  Social History   Tobacco Use  . Smoking status: Former Smoker    Last attempt to quit: 08/10/2010    Years since quitting: 8.6  . Smokeless tobacco: Never Used  Substance Use Topics  . Alcohol use: No    FAMILY HISTORY: Family History  Problem Relation Age of Onset  . Diabetes Mother   . Heart attack Father   . Diabetes Sister   . Throat cancer Brother   . Hypertension Daughter     EXAM: BP (!) 152/82 (BP Location: Right Arm)   Pulse 78   Temp 98.4 F (36.9 C) (Oral)   Resp 10   Ht 5' 0.5" (1.537 m)   Wt 74.4 kg   SpO2 100%   BMI 31.50 kg/m  CONSTITUTIONAL: Alert and oriented and responds appropriately to questions. Well-appearing; well-nourished, appears younger than stated age, afebrile, nontoxic, well-hydrated HEAD: Normocephalic EYES: Conjunctivae clear, pupils appear equal, EOMI ENT: normal nose; moist mucous membranes NECK: Supple, no meningismus, no nuchal rigidity, no LAD  CARD: RRR; S1 and S2 appreciated; no murmurs, no clicks, no rubs, no gallops CHEST:  Chest wall is tender to palpation over the lateral rib cage bilaterally.  No crepitus, ecchymosis, erythema, warmth, rash or other lesions present.   RESP: Normal chest excursion without splinting or tachypnea; breath sounds clear and equal bilaterally; no wheezes, no rhonchi, no rales, no hypoxia or respiratory distress, speaking full sentences ABD/GI: Normal bowel sounds; non-distended; soft, non-tender, no rebound, no guarding, no peritoneal signs, no hepatosplenomegaly BACK:  The back appears normal and is non-tender to palpation, there is no CVA tenderness, no midline spinal tenderness or step-off or deformity EXT: Normal ROM in all joints; non-tender to palpation; no edema; normal capillary refill; no cyanosis, no calf tenderness or swelling    SKIN: Normal color for age and race; warm; no rash NEURO: Moves all extremities equally, normal sensation diffusely, normal  speech PSYCH: The patient's mood and manner are appropriate. Grooming and personal hygiene are appropriate.  MEDICAL DECISION MAKING: Patient here with atypical chest pain likely secondary to coughing.  She has NSAIDs, muscle relaxers and narcotic pain medication at home.  States she did not take her hydrocodone tonight.  She drove herself to the emergency department and would like to drive home.  She is concerned that she could have a rib fracture or pneumonia.  No history of PE or DVT.  Will obtain x-rays of her ribs and chest today.  She is already on treatment for bacterial pneumonia.  Have offered her repeat coronavirus testing today which she declines.  Low suspicion for ACS we will obtain troponin, EKG.  Doubt dissection.  Will give Toradol for pain control in the ED. she has no midline back pain, numbness, weakness or incontinence.  Low suspicion for  cauda equina, epidural abscess or hematoma, discitis or osteomyelitis, spinal fracture, transverse myelitis.  Pain in her back seems to be more the posterior lateral rib cage.  ED PROGRESS: Patient's work-up today shows normal blood counts, electrolytes.  Creatinine mildly elevated at 1.17 which is chronic.  Negative troponin.  Chest x-ray shows no rib fractures or pneumonia.  Urine shows no blood or sign of infection.  She reports feeling better after Toradol.  She is requesting something stronger for pain at home.  Will discharge with prescription of Percocet but have advised her that she cannot take her cough syrup with hydrocodone when she takes Percocet and have advised her to not take this with her baclofen and temazepam as this may be too much sedation.  She has an allergy listed to Percocet but states it causes her to itch but no rash, swelling, hives.  She states she will take it in the daytime.  I feel she is safe to be discharged home.  She has had no respiratory distress or hypoxia here.  Her lungs are clear to auscultation.  She is  hyperglycemic which is likely secondary to her recent steroid use.  She has increased her insulin at home have advised her to watch this closely.   At this time, I do not feel there is any life-threatening condition present. I have reviewed and discussed all results (EKG, imaging, lab, urine as appropriate) and exam findings with patient/family. I have reviewed nursing notes and appropriate previous records.  I feel the patient is safe to be discharged home without further emergent workup and can continue workup as an outpatient as needed. Discussed usual and customary return precautions. Patient/family verbalize understanding and are comfortable with this plan.  Outpatient follow-up has been provided as needed. All questions have been answered.        , Delice Bison, DO 04/02/19 [phone]

## 2019-04-02 NOTE — Discharge Instructions (Signed)
Please continue your doxycycline and steroids as prescribed.  We are sending you home with a prescription of Percocet to take as needed for pain.  Please do not take this with your baclofen, temazepam or your hydrocodone cough syrup.  The combination of all these medications together can make you very drowsy.  Your labs, urine, EKG, chest x-ray today were normal.  I suspect that this is musculoskeletal pain, strain from coughing.

## 2019-04-11 ENCOUNTER — Other Ambulatory Visit: Payer: Self-pay | Admitting: Rheumatology

## 2019-04-11 ENCOUNTER — Emergency Department (HOSPITAL_COMMUNITY)
Admission: EM | Admit: 2019-04-11 | Discharge: 2019-04-11 | Disposition: A | Discharge status: Home / Self Care | Payer: Medicare Other | Attending: Emergency Medicine | Admitting: Emergency Medicine

## 2019-04-11 ENCOUNTER — Other Ambulatory Visit: Payer: Self-pay

## 2019-04-11 ENCOUNTER — Encounter (HOSPITAL_COMMUNITY): Payer: Self-pay

## 2019-04-11 ENCOUNTER — Emergency Department (HOSPITAL_COMMUNITY): Payer: Medicare Other

## 2019-04-11 DIAGNOSIS — Z79899 Other long term (current) drug therapy: Secondary | ICD-10-CM | POA: Insufficient documentation

## 2019-04-11 DIAGNOSIS — J45909 Unspecified asthma, uncomplicated: Secondary | ICD-10-CM | POA: Insufficient documentation

## 2019-04-11 DIAGNOSIS — I10 Essential (primary) hypertension: Secondary | ICD-10-CM | POA: Diagnosis not present

## 2019-04-11 DIAGNOSIS — Z87891 Personal history of nicotine dependence: Secondary | ICD-10-CM | POA: Insufficient documentation

## 2019-04-11 DIAGNOSIS — R05 Cough: Secondary | ICD-10-CM | POA: Diagnosis present

## 2019-04-11 DIAGNOSIS — J209 Acute bronchitis, unspecified: Secondary | ICD-10-CM | POA: Diagnosis not present

## 2019-04-11 DIAGNOSIS — E119 Type 2 diabetes mellitus without complications: Secondary | ICD-10-CM | POA: Insufficient documentation

## 2019-04-11 LAB — BASIC METABOLIC PANEL
Anion gap: 9 (ref 5–15)
BUN: 18 mg/dL (ref 8–23)
CO2: 30 mmol/L (ref 22–32)
Calcium: 9 mg/dL (ref 8.9–10.3)
Chloride: 101 mmol/L (ref 98–111)
Creatinine, Ser: 1.24 mg/dL — ABNORMAL HIGH (ref 0.44–1.00)
GFR calc Af Amer: 51 mL/min — ABNORMAL LOW (ref >60)
GFR calc non Af Amer: 44 mL/min — ABNORMAL LOW (ref >60)
Glucose, Bld: 374 mg/dL — ABNORMAL HIGH (ref 70–99)
Potassium: 4.5 mmol/L (ref 3.5–5.1)
Sodium: 140 mmol/L (ref 135–145)

## 2019-04-11 LAB — TROPONIN I: Troponin I: <0.03 ng/mL (ref <0.03)

## 2019-04-11 LAB — CBC
HCT: 37.7 % (ref 36.0–46.0)
Hemoglobin: 12 g/dL (ref 12.0–15.0)
MCH: 26.5 pg (ref 26.0–34.0)
MCHC: 31.8 g/dL (ref 30.0–36.0)
MCV: 83.4 fL (ref 80.0–100.0)
Platelets: 203 10*3/uL (ref 150–400)
RBC: 4.52 MIL/uL (ref 3.87–5.11)
RDW: 13.5 % (ref 11.5–15.5)
WBC: 8.2 10*3/uL (ref 4.0–10.5)
nRBC: 0 % (ref 0.0–0.2)

## 2019-04-11 MED ORDER — PREDNISONE 20 MG PO TABS
60.0000 mg | ORAL_TABLET | Freq: Once | ORAL | Status: AC
  Administered 2019-04-11: 60 mg via ORAL
  Filled 2019-04-11: qty 3

## 2019-04-11 MED ORDER — PREDNISONE 10 MG PO TABS: 40.0000 mg | ORAL_TABLET | Freq: Every day | ORAL | 0 refills | Status: DC

## 2019-04-11 MED ORDER — ALBUTEROL SULFATE HFA 108 (90 BASE) MCG/ACT IN AERS
2.0000 | INHALATION_SPRAY | Freq: Once | RESPIRATORY_TRACT | Status: AC
  Administered 2019-04-11: 12:00:00 2 via RESPIRATORY_TRACT
  Filled 2019-04-11: qty 6.7

## 2019-04-11 NOTE — ED Triage Notes (Signed)
Patient reports that she was seen 2 weeks ago for cough and back pain. Patient states she had a negative Covid-19 test a few weeks ago. Patient c/o continuous productive coughing with clear sputum. Patient states she vomited x 1 this AM. Patient reports a history of asthma.

## 2019-04-11 NOTE — ED Provider Notes (Signed)
[facility] DEPT Provider Note   CSN: [id number] Arrival date & time: 04/11/19  3086     History   Chief Complaint Chief Complaint  Patient presents with  . Cough    HPI [name] is a 68 y.o. female.     Patient presenting today with a complaint of productive cough for 2 weeks.  And associated back pain.  She had a negative COVID test a few weeks ago.  Patient had continuous productive cough with clear sputum.  States vomited once this morning but that was due to a coughing episode.  She has a history of asthma.  She has nebulizer treatments at home she was on steroids.  But has completed that and now been off them for a few days.  Patient seen on June 5 seen at that time for similar complaints.  Back pain thought was rib pain secondary to the coughing.  Patient denies any fevers.  Patient does have primary care provider Georgianne Fick.  Patient also has diabetes.  She did have trouble with her blood sugars getting high while on the steroids.  And has a history of fibromyalgia.  According to triage patient had low oxygen saturation started on 2 L of oxygen.  When I went in the room to see her oxygen levels were 99% turned oxygen off and her oxygen level stayed above 95%.     Past Medical History:  Diagnosis Date  . Anxiety   . Arthritis   . Asthma   . Depression   . Diabetes mellitus without complication ([place])   . Fibromyalgia   . GERD (gastroesophageal reflux disease)   . Hyperlipemia   . Hypertension   . PONV (postoperative nausea and vomiting)     Patient Active Problem List   Diagnosis Date Noted  . Fibromyalgia 09/04/2017  . Primary insomnia 09/04/2017  . DDD (degenerative disc disease), cervical 09/04/2017  . DDD (degenerative disc disease), lumbar 09/04/2017  . Primary osteoarthritis of both hands 09/04/2017  . Primary osteoarthritis of both knees 09/04/2017  . History of rotator cuff tear 09/04/2017  . Osteopenia of  multiple sites 09/04/2017  . History of diabetes mellitus 09/04/2017  . History of hypertension 09/04/2017  . History of hypercholesterolemia 09/04/2017    Past Surgical History:  Procedure Laterality Date  . COLONOSCOPY    . DILATION AND CURETTAGE OF UTERUS    . KNEE ARTHROSCOPY  [id number]   left  . SHOULDER ARTHROSCOPY     left  . TRIGGER FINGER RELEASE  08/13/2012   Procedure: RELEASE TRIGGER FINGER/A-1 PULLEY;  Surgeon: [name]., MD;  Location: [facility];  Service: Orthopedics;  Laterality: Right;  long finger  . TUBAL LIGATION       OB History   No obstetric history on file.      Home Medications    Prior to Admission medications   Medication Sig Start Date End Date Taking? Authorizing Provider  albuterol (PROVENTIL HFA;VENTOLIN HFA) 108 (90 BASE) MCG/ACT inhaler Inhale 1-2 puffs into the lungs every 4 (four) hours as needed for wheezing or shortness of breath.    Yes [provider]  ALPRAZolam (XANAX) 0.25 MG tablet Take 0.25 mg by mouth daily as needed for anxiety. 02/19/19  Yes [provider]  baclofen (LIORESAL) 10 MG tablet TAKE 1 TABLET(10 MG) BY MOUTH TWICE DAILY AS NEEDED Patient taking differently: Take 10 mg by mouth 2 (two) times daily as needed for muscle spasms.  12/14/18  Yes Deveshwar, Abel Presto, MD  gabapentin (NEURONTIN) 300 MG capsule Take 300 mg by mouth daily. 03/11/19  Yes [provider]  hydrochlorothiazide (HYDRODIURIL) 12.5 MG tablet Take 12.5 mg by mouth daily. 02/10/19  Yes [provider]  LANTUS SOLOSTAR 100 UNIT/ML Solostar Pen Inject 22 Units into the skin at bedtime. 12/09/18  Yes [provider]  meloxicam (MOBIC) 7.5 MG tablet TAKE 1 TABLET(7.5 MG) BY MOUTH DAILY, as needed Patient taking differently: Take 7.5 mg by mouth daily as needed for pain.  05/13/18  Yes Deveshwar, Abel Presto, MD  pantoprazole (PROTONIX) 40 MG tablet Take 40 mg by mouth daily.   Yes [provider]  repaglinide (PRANDIN) 2 MG tablet Take 2 mg by mouth 2 (two) times daily before a meal.   Yes [provider]  simvastatin (ZOCOR) 40 MG tablet Take 40 mg by mouth every evening.   Yes [provider]  temazepam (RESTORIL) 15 MG capsule TAKE 2 CAPSULES BY MOUTH AT BEDTIME AS NEEDED Patient taking differently: Take 30 mg by mouth at bedtime.  03/01/19  Yes Ofilia Neas, PA-C  Vitamin D, Ergocalciferol, (DRISDOL) 50000 units CAPS capsule Take 1 capsule (50,000 Units total) by mouth 2 (two) times a week. Patient taking differently: Take 50,000 Units by mouth once a week.  03/27/17  Yes Panwala, Naitik, PA-C  predniSONE (DELTASONE) 10 MG tablet Take 4 tablets (40 mg total) by mouth daily. 04/11/19   Fredia Sorrow, MD    Family History Family History  Problem Relation Age of Onset  . Diabetes Mother   . Heart attack Father   . Diabetes Sister   . Throat cancer Brother   . Hypertension Daughter     Social History Social History   Tobacco Use  . Smoking status: Former Smoker    Quit date: 08/10/2010    Years since quitting: 8.6  . Smokeless tobacco: Never Used  Substance Use Topics  . Alcohol use: No  . Drug use: No     Allergies   Asa [aspirin], Benadryl [diphenhydramine], Darvon [propoxyphene], Percocet [oxycodone-acetaminophen], Sulfa antibiotics, Talwin [pentazocine], and Tetracyclines & related   Review of Systems Review of Systems  Constitutional: Negative for fever.  HENT: Positive for congestion.   Eyes: Negative for redness.  Respiratory: Positive for cough.   Cardiovascular: Negative for chest pain.  Gastrointestinal: Negative for abdominal pain.  Genitourinary: Negative for dysuria.  Musculoskeletal: Positive for back pain.  Skin: Negative for rash.  Neurological: Negative for headaches.  Hematological: Does not bruise/bleed easily.  Psychiatric/Behavioral: Negative for confusion.     Physical Exam Updated Vital Signs BP 116/65 (BP  Location: Right Arm)   Pulse 80   Temp 98.5 F (36.9 C) (Oral)   Resp 16   Ht 1.537 m (5' 0.5")   Wt 74.4 kg   SpO2 96%   BMI 31.50 kg/m   Physical Exam Vitals signs and nursing note reviewed.  Constitutional:      General: She is not in acute distress.    Appearance: Normal appearance. She is well-developed.  HENT:     Head: Normocephalic and atraumatic.  Eyes:     Extraocular Movements: Extraocular movements intact.     Conjunctiva/sclera: Conjunctivae normal.     Pupils: Pupils are equal, round, and reactive to light.  Neck:     Musculoskeletal: Normal range of motion and neck supple.  Cardiovascular:     Rate and Rhythm: Normal rate and regular rhythm.  Heart sounds: No murmur.  Pulmonary:     Effort: Pulmonary effort is normal. No respiratory distress.     Breath sounds: Wheezing present.  Abdominal:     Palpations: Abdomen is soft.     Tenderness: There is no abdominal tenderness.  Musculoskeletal: Normal range of motion.  Skin:    General: Skin is warm and dry.     Capillary Refill: Capillary refill takes less than 2 seconds.  Neurological:     General: No focal deficit present.     Mental Status: She is alert and oriented to Hahn, place, and time.      ED Treatments / Results  Labs (all labs ordered are listed, but only abnormal results are displayed) Labs Reviewed  BASIC METABOLIC PANEL - Abnormal; Notable for the following components:      Result Value   Glucose, Bld 374 (*)    Creatinine, Ser 1.24 (*)    GFR calc non Af Amer 44 (*)    GFR calc Af Amer 51 (*)    All other components within normal limits  TROPONIN I  CBC    EKG EKG Interpretation  Date/Time:  Sunday April 11 2019 11:30:13 EDT Ventricular Rate:  82 PR Interval:    QRS Duration: 92 QT Interval:  381 QTC Calculation: 445 R Axis:   83 Text Interpretation:  Sinus rhythm Borderline right axis deviation Confirmed by Fredia Sorrow [phone]) on 04/11/2019 11:47:39 AM    Radiology Dg Chest Port 1 View  Result Date: 04/11/2019 CLINICAL DATA:  Patient c/o continuous productive coughing with clear sputum, chest pain and back pain. Patient states she vomited x 1 this AM. Patient reports a history of asthma. EXAM: PORTABLE CHEST 1 VIEW COMPARISON:  04/02/2019 FINDINGS: Atherosclerotic calcification of the aortic arch. Thoracic spondylosis. Linear subsegmental atelectasis at the left lung base. The lungs appear otherwise clear. Heart size within normal limits. IMPRESSION: 1. Subsegmental atelectasis in the left lower lobe. Lungs appear otherwise clear. 2.  Aortic Atherosclerosis (ICD10-I70.0). Electronically Signed   By: Van Clines M.D.   On: 04/11/2019 12:13    Procedures Procedures (including critical care time)  Medications Ordered in ED Medications  albuterol (VENTOLIN HFA) 108 (90 Base) MCG/ACT inhaler 2 puff (2 puffs Inhalation Given 04/11/19 1225)  predniSONE (DELTASONE) tablet 60 mg (60 mg Oral Given 04/11/19 1224)     Initial Impression / Assessment and Plan / ED Course  I have reviewed the triage vital signs and the nursing notes.  Pertinent labs & imaging results that were available during my care of the patient were reviewed by me and considered in my medical decision making (see chart for details).       Patient received albuterol treatment and received 60 mg of prednisone.  Wheezing resolved and patient's breathing feels much better.  Chest x-ray without any acute findings nothing suggestive of pneumonia.  Clinically symptoms are consistent with an exacerbation of asthma as well as some acute bronchitis.  Patient was never in any extremitas.  Oxygen saturations are relying fine on room air.  Patient's blood sugar was elevated she will be continued on prednisone for the next 5 days use either albuterol or her nebulizer treatments every 6 hours at home and follow-up with her primary care doctor tomorrow.  Patient clearly nontoxic no acute  distress.   Final Clinical Impressions(s) / ED Diagnoses   Final diagnoses:  Acute bronchitis, unspecified organism    ED Discharge Orders  Ordered    predniSONE (DELTASONE) 10 MG tablet  Daily     04/11/19 1417           Fredia Sorrow, MD 04/11/19 1431

## 2019-04-11 NOTE — Discharge Instructions (Addendum)
Follow-up with your regular doctor give a call in the morning to set up an appointment.  Use either your nebulizer or your albuterol inhaler every 6 hours.  Take the steroid prednisone as directed for the next 5 days.  Sent directly to your pharmacy.  Your dose for today is already been given.  Watch your blood sugars carefully as you know this will cause an increase in them.  Return for any new or worse symptoms.

## 2019-04-12 ENCOUNTER — Other Ambulatory Visit: Payer: Self-pay

## 2019-04-12 MED ORDER — BACLOFEN 10 MG PO TABS: 10.0000 mg | ORAL_TABLET | Freq: Two times a day (BID) | ORAL | 0 refills | Status: DC | PRN

## 2019-04-12 NOTE — Telephone Encounter (Signed)
Spoke with patient and advised the baclofen has been refilled but she should discontinue mobic due to elevated creatinine. Patient verbalized understanding.

## 2019-04-12 NOTE — Telephone Encounter (Signed)
Last Visit: 09/22/2018 Next Visit: message sent to the front desk to schedule.  Labs: 04/11/2019 CBC, BMP elevated glucose, creat 1.24, GFR 51  Last fill of mobic: 05/13/2018  Okay to refill baclofen and mobic?

## 2019-04-12 NOTE — Telephone Encounter (Signed)
Okay to refill baclofen.  Patient should discontinue Mobic due to elevated creatinine.

## 2019-04-25 ENCOUNTER — Ambulatory Visit (INDEPENDENT_AMBULATORY_CARE_PROVIDER_SITE_OTHER): Payer: Medicare Other

## 2019-04-25 ENCOUNTER — Ambulatory Visit (HOSPITAL_COMMUNITY)
Admission: EM | Admit: 2019-04-25 | Discharge: 2019-04-25 | Disposition: A | Discharge status: Home / Self Care | Payer: Medicare Other | Attending: Emergency Medicine | Admitting: Emergency Medicine

## 2019-04-25 ENCOUNTER — Other Ambulatory Visit: Payer: Self-pay

## 2019-04-25 ENCOUNTER — Encounter (HOSPITAL_COMMUNITY): Payer: Self-pay | Admitting: Emergency Medicine

## 2019-04-25 DIAGNOSIS — M545 Low back pain, unspecified: Secondary | ICD-10-CM

## 2019-04-25 DIAGNOSIS — S161XXA Strain of muscle, fascia and tendon at neck level, initial encounter: Secondary | ICD-10-CM | POA: Diagnosis not present

## 2019-04-25 DIAGNOSIS — M546 Pain in thoracic spine: Secondary | ICD-10-CM

## 2019-04-25 MED ORDER — CYCLOBENZAPRINE HCL 5 MG PO TABS: 5.0000 mg | ORAL_TABLET | Freq: Every day | ORAL | 0 refills | Status: AC

## 2019-04-25 MED ORDER — MELOXICAM 7.5 MG PO TABS: 7.5000 mg | ORAL_TABLET | Freq: Every day | ORAL | 0 refills | Status: AC
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
## 2019-04-25 NOTE — Discharge Instructions (Signed)
Mobic daily for the next 1-2 weeks Flexeril at bedtime- may cause drowsiness Ice and elevate left leg  Follow up if not getting better or worsening
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
## 2019-04-28 ENCOUNTER — Encounter (HOSPITAL_COMMUNITY): Payer: Self-pay

## 2019-04-28 ENCOUNTER — Ambulatory Visit (INDEPENDENT_AMBULATORY_CARE_PROVIDER_SITE_OTHER): Payer: Medicare Other

## 2019-04-28 ENCOUNTER — Ambulatory Visit (HOSPITAL_COMMUNITY)
Admission: EM | Admit: 2019-04-28 | Discharge: 2019-04-28 | Disposition: A | Discharge status: Home / Self Care | Payer: Medicare Other | Attending: Emergency Medicine | Admitting: Emergency Medicine

## 2019-04-28 ENCOUNTER — Other Ambulatory Visit: Payer: Self-pay

## 2019-04-28 DIAGNOSIS — M25572 Pain in left ankle and joints of left foot: Secondary | ICD-10-CM

## 2019-04-28 DIAGNOSIS — M7989 Other specified soft tissue disorders: Secondary | ICD-10-CM

## 2019-04-28 MED ORDER — MEDICAL COMPRESSION STOCKINGS MISC: 1.0000 | Freq: Four times a day (QID) | 0 refills | Status: DC | PRN

## 2019-04-28 NOTE — Discharge Instructions (Signed)
Continue mobic daily with food Continue elevation Please contact your primary for further evaluation of swelling, may consider ultrasound to rule out blood clot May try compression stocking- above the knee

## 2019-04-28 NOTE — ED Triage Notes (Signed)
Patient presents to Urgent Care with complaints of continued left leg pain and new onset swelling from the knee down since 2 days ago. Patient reports she was seen a few days ago for same but the swelling is still bad, pt is not using the ace wrap she was given during her last visit.
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
## 2019-05-09 ENCOUNTER — Other Ambulatory Visit: Payer: Self-pay | Admitting: Physician Assistant

## 2019-05-10 NOTE — Telephone Encounter (Signed)
Last Visit: 09/22/2018 Next Visit: 05/21/2019  Okay to refill Restoril?

## 2019-05-11 ENCOUNTER — Other Ambulatory Visit: Payer: Self-pay | Admitting: Rheumatology

## 2019-05-11 NOTE — Telephone Encounter (Signed)
Last Visit:09/22/2018 Next Visit:05/21/2019  Okay to refill per Dr. Estanislado Pandy

## 2019-05-21 ENCOUNTER — Encounter: Payer: Self-pay | Admitting: Physician Assistant

## 2019-05-21 ENCOUNTER — Ambulatory Visit (INDEPENDENT_AMBULATORY_CARE_PROVIDER_SITE_OTHER): Payer: Medicare Other | Admitting: Physician Assistant

## 2019-05-21 ENCOUNTER — Other Ambulatory Visit: Payer: Self-pay

## 2019-05-21 VITALS — BP 126/79 | HR 89 | Resp 14 | Ht 65.5 in | Wt 160.0 lb

## 2019-05-21 DIAGNOSIS — M8589 Other specified disorders of bone density and structure, multiple sites: Secondary | ICD-10-CM

## 2019-05-21 DIAGNOSIS — M170 Bilateral primary osteoarthritis of knee: Secondary | ICD-10-CM | POA: Diagnosis not present

## 2019-05-21 DIAGNOSIS — M797 Fibromyalgia: Secondary | ICD-10-CM

## 2019-05-21 DIAGNOSIS — Z8739 Personal history of other diseases of the musculoskeletal system and connective tissue: Secondary | ICD-10-CM

## 2019-05-21 DIAGNOSIS — M5136 Other intervertebral disc degeneration, lumbar region: Secondary | ICD-10-CM

## 2019-05-21 DIAGNOSIS — M25572 Pain in left ankle and joints of left foot: Secondary | ICD-10-CM

## 2019-05-21 DIAGNOSIS — M19042 Primary osteoarthritis, left hand: Secondary | ICD-10-CM

## 2019-05-21 DIAGNOSIS — M19041 Primary osteoarthritis, right hand: Secondary | ICD-10-CM

## 2019-05-21 DIAGNOSIS — F5101 Primary insomnia: Secondary | ICD-10-CM | POA: Diagnosis not present

## 2019-05-21 DIAGNOSIS — Z8639 Personal history of other endocrine, nutritional and metabolic disease: Secondary | ICD-10-CM

## 2019-05-21 DIAGNOSIS — Z8679 Personal history of other diseases of the circulatory system: Secondary | ICD-10-CM

## 2019-05-21 DIAGNOSIS — M5030 Other cervical disc degeneration, unspecified cervical region: Secondary | ICD-10-CM

## 2019-05-21 DIAGNOSIS — M62838 Other muscle spasm: Secondary | ICD-10-CM

## 2019-05-25 ENCOUNTER — Encounter: Payer: Self-pay | Admitting: Orthopedic Surgery

## 2019-05-25 ENCOUNTER — Ambulatory Visit (INDEPENDENT_AMBULATORY_CARE_PROVIDER_SITE_OTHER): Payer: Medicare Other | Admitting: Orthopedic Surgery

## 2019-05-25 ENCOUNTER — Ambulatory Visit (INDEPENDENT_AMBULATORY_CARE_PROVIDER_SITE_OTHER): Payer: Medicare Other

## 2019-05-25 VITALS — Ht 65.0 in | Wt 160.0 lb

## 2019-05-25 DIAGNOSIS — M25572 Pain in left ankle and joints of left foot: Secondary | ICD-10-CM
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
## 2019-05-26 ENCOUNTER — Ambulatory Visit (INDEPENDENT_AMBULATORY_CARE_PROVIDER_SITE_OTHER): Payer: Medicare Other | Admitting: Pulmonary Disease

## 2019-05-26 ENCOUNTER — Encounter: Payer: Self-pay | Admitting: Pulmonary Disease

## 2019-05-26 ENCOUNTER — Other Ambulatory Visit: Payer: Self-pay

## 2019-05-26 ENCOUNTER — Ambulatory Visit (INDEPENDENT_AMBULATORY_CARE_PROVIDER_SITE_OTHER): Payer: Medicare Other

## 2019-05-26 VITALS — BP 112/62 | HR 85 | Temp 97.7°F | Ht 65.0 in | Wt 160.0 lb

## 2019-05-26 DIAGNOSIS — R059 Cough, unspecified: Secondary | ICD-10-CM

## 2019-05-26 DIAGNOSIS — R05 Cough: Secondary | ICD-10-CM | POA: Diagnosis not present

## 2019-05-26 NOTE — Progress Notes (Signed)
Subjective:    Patient ID: [name], female    DOB: [birth year], 68 y.o.   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 Anxiety   . Arthritis   . Asthma   . Depression   . Diabetes mellitus without complication ([place])   . Fibromyalgia   . GERD (gastroesophageal reflux disease)   . Hyperlipemia   . Hypertension   . PONV (postoperative nausea and vomiting)    Family History  Problem Relation Age of Onset  . Diabetes Mother   . Heart attack Father   . Diabetes Sister   . Throat cancer Brother   . Hypertension Daughter      Review of Systems  Constitutional: Negative for fever and unexpected weight change.  HENT: Negative for congestion, dental problem, ear pain, nosebleeds, postnasal drip, rhinorrhea, sinus pressure, sneezing, sore throat and trouble swallowing.   Eyes: Negative for redness and itching.  Respiratory: Positive for cough and wheezing. Negative for chest tightness and shortness of breath.   Cardiovascular: Negative for palpitations and leg swelling.  Gastrointestinal: Negative for nausea and vomiting.  Genitourinary: Negative for dysuria.  Musculoskeletal: Negative for joint swelling.  Skin: Negative for rash.  Allergic/Immunologic: Positive for environmental allergies and food allergies. Negative for immunocompromised  state.  Neurological: Negative for headaches.  Hematological: Does not bruise/bleed easily.  Psychiatric/Behavioral: Negative for dysphoric mood. The patient is nervous/anxious.        Objective:   Physical Exam Vitals signs reviewed.  Constitutional:      Appearance: Normal appearance.  Neck:     Musculoskeletal: Normal range of motion. No neck rigidity or muscular tenderness.  Cardiovascular:     Rate and Rhythm: Normal rate and regular rhythm.     Heart sounds: No murmur. No friction rub.  Pulmonary:     Effort: Pulmonary effort is normal. No respiratory distress.     Breath sounds: Normal breath sounds. No stridor. No wheezing or rhonchi.  Abdominal:     General: There is no distension.     Palpations: There is no mass.     Tenderness: There is no abdominal tenderness.  Musculoskeletal: Normal range of motion.        General: No swelling or tenderness.  Skin:    General: Skin is warm.     Coloration: Skin is not jaundiced.  Neurological:     General: No focal deficit present.     Mental Status: She is alert.     Cranial Nerves: No cranial nerve deficit.  Psychiatric:        Mood and Affect: Mood normal.        Behavior: Behavior normal.    Vitals:   05/26/19 1533  BP: 112/62  Pulse: 85  Temp: 97.7 F (36.5 C)  SpO2: 97%   Previous chest x-ray reviewed showing some  atelectasis, no clear-cut infiltrate No previous pulmonary function studies    Assessment & Plan:  .  Protracted bronchitis -Symptoms are little bit better at present -Occasional cough  .  Mild to moderate probability of obstructive sleep apnea -She does not want to get tested at present  She may have underlying obstructive lung disease-further evaluation with pulmonary function studies appropriate  Plan: Obtain pulmonary function study Obtain chest x-ray-follow-up on previous atelectasis  Continue bronchodilator treatment as needed  Encourage increase physical activity  I will see her  back in the office in about 2 to 3 months Encouraged to call with any significant concerns

## 2019-05-26 NOTE — Patient Instructions (Signed)
Protracted bronchitis-improving  Chest x-ray Pulmonary function test  Nebulizer/MDI albuterol as needed  Call with any significant concerns  We will see you back in the office in 2 to 3 months

## 2019-06-10 ENCOUNTER — Other Ambulatory Visit: Payer: Self-pay | Admitting: Rheumatology

## 2019-06-10 NOTE — Telephone Encounter (Signed)
Last Visit: 05/21/19 Next Visit: 11/19/19  Okay to refill per Dr. Estanislado Pandy

## 2019-06-22 ENCOUNTER — Encounter: Payer: Self-pay | Admitting: Orthopedic Surgery

## 2019-06-22 ENCOUNTER — Ambulatory Visit (INDEPENDENT_AMBULATORY_CARE_PROVIDER_SITE_OTHER): Payer: Medicare Other | Admitting: Orthopedic Surgery

## 2019-06-22 VITALS — Ht 65.0 in | Wt 160.0 lb

## 2019-06-22 DIAGNOSIS — M25572 Pain in left ankle and joints of left foot: Secondary | ICD-10-CM | POA: Diagnosis not present
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
## 2019-07-10 ENCOUNTER — Other Ambulatory Visit: Payer: Self-pay | Admitting: Rheumatology

## 2019-07-11 ENCOUNTER — Other Ambulatory Visit: Payer: Self-pay | Admitting: Physician Assistant

## 2019-07-12 NOTE — Telephone Encounter (Signed)
Last Visit: 05/21/19 Next Visit: 11/19/19  Okay to refill Restoril?

## 2019-07-12 NOTE — Telephone Encounter (Signed)
Last Visit: 05/21/19 Next Visit: 11/19/19  Okay to refill per Dr. Estanislado Pandy

## 2019-07-20 ENCOUNTER — Ambulatory Visit (INDEPENDENT_AMBULATORY_CARE_PROVIDER_SITE_OTHER): Payer: Medicare Other | Admitting: Orthopedic Surgery

## 2019-07-20 ENCOUNTER — Other Ambulatory Visit: Payer: Self-pay

## 2019-07-20 ENCOUNTER — Encounter: Payer: Self-pay | Admitting: Orthopedic Surgery

## 2019-07-20 VITALS — Ht 65.0 in | Wt 160.0 lb

## 2019-07-20 DIAGNOSIS — M25572 Pain in left ankle and joints of left foot: Secondary | ICD-10-CM

## 2019-07-21 ENCOUNTER — Encounter: Payer: Self-pay | Admitting: Orthopedic Surgery
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
## 2019-07-26 ENCOUNTER — Telehealth: Payer: Self-pay | Admitting: Orthopedic Surgery
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
## 2019-07-26 NOTE — Telephone Encounter (Signed)
Will evaluate for MRI scan at follow up

## 2019-07-26 NOTE — Telephone Encounter (Signed)
Patient called and I advised her of the message from Dr Sharol Given.  The patient stated that she did not wait til the 20th to be evaluated for the MRI.  She said something is going on with her foot.  Thank you

## 2019-07-26 NOTE — Telephone Encounter (Signed)
Patient was called and discussed with her that she needs to be evaluated in the office again before doing a MRI per Dr Sharol Given states. Patient states she will call back to get an appt.

## 2019-07-26 NOTE — Telephone Encounter (Signed)
Patient called. Says she is not getting better and would like a MRI. Her call back number is [phone]

## 2019-08-02 ENCOUNTER — Telehealth: Payer: Self-pay | Admitting: Pulmonary Disease

## 2019-08-03 NOTE — Telephone Encounter (Signed)
Called and spoke to patient - pt states that she will call back to schedule pft and f/u with AO -pr

## 2019-08-09 ENCOUNTER — Other Ambulatory Visit: Payer: Self-pay | Admitting: Rheumatology

## 2019-08-09 NOTE — Telephone Encounter (Signed)
Last Visit: 05/21/19 Next Visit: 11/19/19  Okay to refill per Dr. Estanislado Pandy

## 2019-08-17 ENCOUNTER — Encounter: Payer: Self-pay | Admitting: Orthopedic Surgery

## 2019-08-17 ENCOUNTER — Other Ambulatory Visit: Payer: Self-pay

## 2019-08-17 ENCOUNTER — Ambulatory Visit (INDEPENDENT_AMBULATORY_CARE_PROVIDER_SITE_OTHER): Payer: Medicare Other | Admitting: Orthopedic Surgery

## 2019-08-17 VITALS — Ht 65.0 in | Wt 160.0 lb

## 2019-08-17 DIAGNOSIS — M25572 Pain in left ankle and joints of left foot: Secondary | ICD-10-CM

## 2019-08-18 ENCOUNTER — Encounter: Payer: Self-pay | Admitting: Orthopedic Surgery

## 2019-08-18 ENCOUNTER — Other Ambulatory Visit: Payer: Self-pay | Admitting: Physician Assistant
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
## 2019-08-19 NOTE — Telephone Encounter (Signed)
Last Visit: 05/21/19 Next Visit: 11/19/19  Okay to refill Restoril?

## 2019-09-14 ENCOUNTER — Encounter: Payer: Self-pay | Admitting: Orthopedic Surgery

## 2019-09-14 ENCOUNTER — Ambulatory Visit (INDEPENDENT_AMBULATORY_CARE_PROVIDER_SITE_OTHER): Payer: Medicare Other | Admitting: Orthopedic Surgery

## 2019-09-14 ENCOUNTER — Other Ambulatory Visit: Payer: Self-pay

## 2019-09-14 ENCOUNTER — Other Ambulatory Visit: Payer: Self-pay | Admitting: *Deleted

## 2019-09-14 VITALS — Ht 65.0 in | Wt 160.0 lb

## 2019-09-14 DIAGNOSIS — M25572 Pain in left ankle and joints of left foot: Secondary | ICD-10-CM

## 2019-09-14 MED ORDER — BACLOFEN 10 MG PO TABS: ORAL_TABLET | ORAL | 0 refills | Status: DC
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
## 2019-09-14 NOTE — Telephone Encounter (Signed)
Refill request received via fax  Last Visit: 05/21/19 Next Visit: 11/19/19  Okay to refill per Dr. Estanislado Pandy

## 2019-09-30 ENCOUNTER — Other Ambulatory Visit: Payer: Self-pay

## 2019-09-30 MED ORDER — TEMAZEPAM 15 MG PO CAPS: ORAL_CAPSULE | ORAL | 0 refills | Status: DC

## 2019-09-30 NOTE — Telephone Encounter (Signed)
Refill request received via fax from [facility] for temazepam.   Last Visit: 05/21/2019 Next Visit: 11/19/2019  Last fill: 08/19/2019  Okay to refill temazepam?

## 2019-10-08 ENCOUNTER — Other Ambulatory Visit: Payer: Self-pay | Admitting: Rheumatology

## 2019-10-08 NOTE — Telephone Encounter (Signed)
Last Visit: 05/21/2019 Next Visit: 11/19/2019  Okay to refill per Dr. Estanislado Pandy.

## 2019-11-05 ENCOUNTER — Ambulatory Visit (INDEPENDENT_AMBULATORY_CARE_PROVIDER_SITE_OTHER): Payer: Medicare PPO

## 2019-11-05 ENCOUNTER — Telehealth: Payer: Self-pay | Admitting: *Deleted

## 2019-11-05 ENCOUNTER — Other Ambulatory Visit: Payer: Self-pay

## 2019-11-05 ENCOUNTER — Ambulatory Visit: Payer: Medicare Other | Admitting: Podiatry

## 2019-11-05 ENCOUNTER — Encounter: Payer: Self-pay | Admitting: Podiatry

## 2019-11-05 DIAGNOSIS — T148XXA Other injury of unspecified body region, initial encounter: Secondary | ICD-10-CM

## 2019-11-05 DIAGNOSIS — M25472 Effusion, left ankle: Secondary | ICD-10-CM

## 2019-11-05 DIAGNOSIS — M7989 Other specified soft tissue disorders: Secondary | ICD-10-CM | POA: Diagnosis not present

## 2019-11-05 DIAGNOSIS — M1990 Unspecified osteoarthritis, unspecified site: Secondary | ICD-10-CM
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
## 2019-11-05 NOTE — Telephone Encounter (Signed)
Orders to L. Cox, CMA for pre-cert and faxed to [facility].

## 2019-11-05 NOTE — Patient Instructions (Signed)
I have ordered a MRI of your left foot and ankle. If you do not hear for them about scheduling within the next 1 week, or you have any questions please give [name] a call at [phone].
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
## 2019-11-07 ENCOUNTER — Other Ambulatory Visit: Payer: Self-pay | Admitting: Rheumatology

## 2019-11-08 NOTE — Telephone Encounter (Signed)
Last Visit: 05/21/2019 Next Visit:11/19/2019  Okay to refill per Dr. Estanislado Pandy.

## 2019-11-09 ENCOUNTER — Telehealth: Payer: Self-pay | Admitting: *Deleted

## 2019-11-09 NOTE — Telephone Encounter (Signed)
Called and spoke with Steffanie Dunn at [facility] at [phone] and authorization number is [id number] and is valid 11-09-2019 thru 12-20-2019. [name]

## 2019-11-09 NOTE — Telephone Encounter (Signed)
Received fax Authorization: [id number], valid 11/09/2019 - 12/09/2019 for MRI [id number] left ankle. Copy of orders and [facility] faxed to [place].

## 2019-11-12 ENCOUNTER — Telehealth: Payer: Self-pay | Admitting: *Deleted

## 2019-11-12 ENCOUNTER — Telehealth: Payer: Self-pay | Admitting: Rheumatology

## 2019-11-12 MED ORDER — TEMAZEPAM 15 MG PO CAPS: ORAL_CAPSULE | ORAL | 0 refills | Status: DC

## 2019-11-12 NOTE — Telephone Encounter (Signed)
Last Visit: 05/21/2019 Next Visit:11/19/2019  Okay to refill Restoril?

## 2019-11-12 NOTE — Telephone Encounter (Signed)
Called and spoke with the [employer] representative Valentina Shaggy and representative stated that the MR foot left (80638) authorization number is [id number] and the MR ankle left (41597) authorization number is [id number]. [name]

## 2019-11-12 NOTE — Telephone Encounter (Signed)
Patient requesting a refill on Temazepam sent to [facility] on [address].

## 2019-11-15 NOTE — Progress Notes (Signed)
Virtual Visit via Telephone Note  I connected with [name] on 11/19/19 at  9:45 AM EST by telephone and verified that I am speaking with the correct person using two identifiers.  Location: Patient: Home Provider: Clinic  This service was conducted via virtual visit.  The patient was located at home. I was located in my office.  Consent was obtained prior to the virtual visit and is aware of possible charges through their insurance for this visit.  The patient is an established patient.  Dr. Estanislado Pandy, MD conducted the virtual visit and Hazel Sams, PA-C acted as scribe during the service.  Office staff helped with scheduling follow up visits after the service was conducted.     I discussed the limitations, risks, security and privacy concerns of performing an evaluation and management service by telephone and the availability of in person appointments. I also discussed with the patient that there may be a patient responsible charge related to this service. The patient expressed understanding and agreed to proceed.  CC: Lower back pain  History of Present Illness: [name] is a 69 y.o. female with history of fibromyalgia, osteoarthritis, and DDD.  She takes gabapentin 300 mg by mouth daily and baclofen 10 mg 1 tablet by mouth twice daily as needed for muscle spasms. She does not find baclofen to be effective. She continues to have chronic neck and lower back pain.  She has intermittent symptoms of sciatica. She takes Restoril 15 mg 2 capsules by mouth at bedtime as needed for insomnia.   Review of Systems  Constitutional: Positive for malaise/fatigue. Negative for fever.  HENT: Negative for ear pain.   Eyes: Negative for photophobia, pain, discharge and redness.  Respiratory: Negative for cough, shortness of breath and wheezing.   Cardiovascular: Negative for chest pain and palpitations.  Gastrointestinal: Negative for blood in stool, constipation and diarrhea.  Genitourinary:  Negative for dysuria and urgency.  Musculoskeletal: Positive for back pain, joint pain and myalgias. Negative for neck pain.  Skin: Negative for rash.  Neurological: Negative for dizziness and headaches.  Endo/Heme/Allergies: Does not bruise/bleed easily.  Psychiatric/Behavioral: Negative for depression. The patient is nervous/anxious and has insomnia.      Observations/Objective: Physical Exam  Constitutional: She is oriented to person, place, and time.  Neurological: She is alert and oriented to person, place, and time.  Psychiatric: Mood, memory, affect and judgment normal.     Patient reports morning stiffness for several hours.   Patient reports nocturnal pain.  Difficulty dressing/grooming: Denies Difficulty climbing stairs: Reports Difficulty getting out of chair: Reports Difficulty using hands for taps, buttons, cutlery, and/or writing: Reports  Assessment and Plan: Visit Diagnoses: Fibromyalgia - She has generalized muscle aches and muscle tenderness due to fibromyalgia.  She continues to take gabapentin 300 mg 1 capsule by mouth daily and baclofen 10 mg 1 tablet by mouth twice daily as needed for muscle spasms. She has not found baclofen to be effective, so we discussed she should only take it as needed or discontinue if it is not helpeful. She has chronic fatigue related to insomnia.  She is taking Restoril 15 mg 2 capsules by mouth at bedtime.  She continues to have interrupted sleep at night due to nocturnal pain.  We discussed the importance of regular exercise and good sleep hygiene. She will follow up in 6 months.   Primary insomnia - She takes Temazepam 15 mg 2 tablets by mouth at bedtime for insomnia.  She continues to  have interrupted sleep at night. The importance of good sleep hygiene was discussed.   Primary osteoarthritis of both hands: She has chronic pain in both hands.  No joint inflammation. Joint protection and muscle strengthening were discussed.    Primary osteoarthritis of both knees: She has chronic pain in both knee joints.  She has difficulty climbing steps and getting up from a chair.    History of rotator cuff tear - left s/p repair in the past by Dr. Ronnie Derby:  She has intermittent discomfort in the left shoulder joint.    Trapezius muscle spasm -She experiences intermittent trapezius muscle tension and tenderness. She takes baclofen 10 mg 1 tablet by mouth BID as needed for muscle spasms.  DDD (degenerative disc disease), cervical: She has chronic neck pain and stiffness.   DDD (degenerative disc disease), lumbar: Chronic pain. She has intermittent symptoms of sciatica.  She takes baclofen 10 mg BID prn for muscle spasms.  She continues to take gabapentin 300 mg 1 capsule daily.   Other medical conditions are listed as follows:  Osteopenia of multiple sites: She is taking a vitamin D supplement.   History of hypertension   History of diabetes mellitus   History of hypercholesterolemia   Follow Up Instructions: She will follow up in 6 months.    I discussed the assessment and treatment plan with the patient. The patient was provided an opportunity to ask questions and all were answered. The patient agreed with the plan and demonstrated an understanding of the instructions.   The patient was advised to call back or seek an in-person evaluation if the symptoms worsen or if the condition fails to improve as anticipated.  I provided 15 minutes of non-face-to-face time during this encounter.  Bo Merino, MD   Scribed by-  Hazel Sams, PA-C

## 2019-11-19 ENCOUNTER — Encounter: Payer: Self-pay | Admitting: Rheumatology

## 2019-11-19 ENCOUNTER — Other Ambulatory Visit: Payer: Self-pay

## 2019-11-19 ENCOUNTER — Telehealth (INDEPENDENT_AMBULATORY_CARE_PROVIDER_SITE_OTHER): Payer: Medicare PPO | Admitting: Rheumatology

## 2019-11-19 DIAGNOSIS — F5101 Primary insomnia: Secondary | ICD-10-CM | POA: Diagnosis not present

## 2019-11-19 DIAGNOSIS — M170 Bilateral primary osteoarthritis of knee: Secondary | ICD-10-CM

## 2019-11-19 DIAGNOSIS — M8589 Other specified disorders of bone density and structure, multiple sites: Secondary | ICD-10-CM

## 2019-11-19 DIAGNOSIS — M62838 Other muscle spasm: Secondary | ICD-10-CM

## 2019-11-19 DIAGNOSIS — M19041 Primary osteoarthritis, right hand: Secondary | ICD-10-CM

## 2019-11-19 DIAGNOSIS — M5136 Other intervertebral disc degeneration, lumbar region: Secondary | ICD-10-CM

## 2019-11-19 DIAGNOSIS — M5030 Other cervical disc degeneration, unspecified cervical region: Secondary | ICD-10-CM

## 2019-11-19 DIAGNOSIS — Z8679 Personal history of other diseases of the circulatory system: Secondary | ICD-10-CM

## 2019-11-19 DIAGNOSIS — M797 Fibromyalgia: Secondary | ICD-10-CM | POA: Diagnosis not present

## 2019-11-19 DIAGNOSIS — Z8639 Personal history of other endocrine, nutritional and metabolic disease: Secondary | ICD-10-CM

## 2019-11-19 DIAGNOSIS — M19042 Primary osteoarthritis, left hand: Secondary | ICD-10-CM

## 2019-11-19 DIAGNOSIS — Z8739 Personal history of other diseases of the musculoskeletal system and connective tissue: Secondary | ICD-10-CM

## 2019-12-01 ENCOUNTER — Ambulatory Visit
Admission: RE | Admit: 2019-12-01 | Discharge: 2019-12-01 | Disposition: A | Discharge status: Home / Self Care | Payer: Medicare Other | Source: Ambulatory Visit | Attending: Podiatry | Admitting: Podiatry

## 2019-12-01 ENCOUNTER — Other Ambulatory Visit: Payer: Self-pay

## 2019-12-06 ENCOUNTER — Other Ambulatory Visit: Payer: Self-pay | Admitting: *Deleted

## 2019-12-06 MED ORDER — TEMAZEPAM 15 MG PO CAPS: ORAL_CAPSULE | ORAL | 0 refills | Status: DC

## 2019-12-06 NOTE — Telephone Encounter (Signed)
Refill request received via fax  Last Visit: 11/19/19 Next Visit: 05/30/20  Okay to refill Temazepam?

## 2019-12-07 ENCOUNTER — Ambulatory Visit: Payer: Medicare PPO | Admitting: Podiatry

## 2019-12-07 ENCOUNTER — Other Ambulatory Visit: Payer: Self-pay

## 2019-12-07 DIAGNOSIS — M792 Neuralgia and neuritis, unspecified: Secondary | ICD-10-CM | POA: Diagnosis not present

## 2019-12-07 DIAGNOSIS — M25572 Pain in left ankle and joints of left foot: Secondary | ICD-10-CM

## 2019-12-07 DIAGNOSIS — G8929 Other chronic pain: Secondary | ICD-10-CM | POA: Diagnosis not present

## 2019-12-13 ENCOUNTER — Telehealth: Payer: Self-pay | Admitting: *Deleted

## 2019-12-13 NOTE — Telephone Encounter (Signed)
Called and left a message for Tori at [facility] and representative called back and left a message and I faxed over the order with chart notes today. [name]

## 2019-12-13 NOTE — Telephone Encounter (Signed)
-----   Message from Trula Slade, DPM sent at 12/13/2019  7:31 AM EST ----- Can you please try to order her a TENS unit? Thanks.
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
## 2019-12-20 ENCOUNTER — Telehealth: Payer: Self-pay | Admitting: *Deleted

## 2019-12-20 NOTE — Telephone Encounter (Signed)
Patient left a message and I called the patient back and stated that the representative from [facility] (tens unit) had left several messages and then patient was asking about the nerve conduction test and I stated that I would check and call the patient back and tried to call the patient back and had to leave a message on the voice mail  that the last visit the patient declined to get the test done and the representative from [facility] would be calling today. [name]

## 2019-12-31 ENCOUNTER — Telehealth: Payer: Self-pay

## 2019-12-31 DIAGNOSIS — M25572 Pain in left ankle and joints of left foot: Secondary | ICD-10-CM

## 2019-12-31 DIAGNOSIS — M792 Neuralgia and neuritis, unspecified: Secondary | ICD-10-CM

## 2019-12-31 DIAGNOSIS — G8929 Other chronic pain: Secondary | ICD-10-CM
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
## 2019-12-31 NOTE — Telephone Encounter (Signed)
Pt called stating that her foot has been hurting since last night and is still currently hurting. She is a Dr. Jacqualyn Posey pt and would like to schedule the nerve conduction test.

## 2020-01-03 ENCOUNTER — Other Ambulatory Visit: Payer: Self-pay

## 2020-01-03 ENCOUNTER — Encounter: Payer: Self-pay | Admitting: Neurology

## 2020-01-03 DIAGNOSIS — G8929 Other chronic pain: Secondary | ICD-10-CM

## 2020-01-04 NOTE — Telephone Encounter (Signed)
Yes, lets wait for the NCV first and then we can determine further treatment from there. Thank you.

## 2020-01-06 ENCOUNTER — Other Ambulatory Visit: Payer: Self-pay | Admitting: Rheumatology

## 2020-01-06 NOTE — Telephone Encounter (Signed)
Last Visit: 11/19/19 Next Visit: 05/30/20  Okay to refill per Dr. Estanislado Pandy

## 2020-01-07 ENCOUNTER — Other Ambulatory Visit: Payer: Self-pay | Admitting: *Deleted

## 2020-01-07 MED ORDER — TEMAZEPAM 15 MG PO CAPS: ORAL_CAPSULE | ORAL | 0 refills | Status: DC

## 2020-01-07 NOTE — Telephone Encounter (Signed)
Refill request received via fax  Last Visit: 11/19/19 Next Visit: 05/30/20  Okay to refill Temazepam?

## 2020-01-14 ENCOUNTER — Telehealth: Payer: Self-pay | Admitting: *Deleted

## 2020-01-14 NOTE — Telephone Encounter (Signed)
I told pt the results would be 3-5 days and I would call with Dr. Leigh Aurora review and orders.

## 2020-01-14 NOTE — Telephone Encounter (Signed)
Pt states she is scheduled for the NCV on Wednesday and would like to know when she would get the results.

## 2020-01-18 ENCOUNTER — Ambulatory Visit: Payer: Medicare PPO | Admitting: Podiatry

## 2020-01-19 ENCOUNTER — Ambulatory Visit (INDEPENDENT_AMBULATORY_CARE_PROVIDER_SITE_OTHER): Payer: Medicare PPO | Admitting: Neurology

## 2020-01-19 ENCOUNTER — Other Ambulatory Visit: Payer: Self-pay

## 2020-01-19 DIAGNOSIS — G8929 Other chronic pain: Secondary | ICD-10-CM

## 2020-01-19 DIAGNOSIS — M792 Neuralgia and neuritis, unspecified: Secondary | ICD-10-CM

## 2020-01-19 DIAGNOSIS — M25572 Pain in left ankle and joints of left foot: Secondary | ICD-10-CM

## 2020-01-19 NOTE — Procedures (Signed)
[facility]  [place], [address]  [place],  [address] Tel: [phone] Fax:  [phone] Test Date:  01/19/2020  Patient: [name] DOB: April 01, 1951 Physician: Narda Amber, DO  Sex: Female Height: 5\' 5"  Ref Phys: Celesta Gentile, DPM  ID#: [id number] Temp: 32.0C Technician:    Patient Complaints: This is a 69 year old female referred for evaluation of left foot pain and paresthesias.  NCV & EMG Findings: Extensive electrodiagnostic testing of the left lower extremity shows:  1. Left sural and superficial peroneal sensory responses are within normal limits. 2. Left peroneal and tibial motor responses are within normal limits. 3. Left tibial H reflex study is within normal limits. 4. There is no evidence of active or chronic motor axonal changes affecting any of the tested muscles.    Impression: This is a normal study of the left lower extremity.  In particular, there is no evidence of a sensorimotor polyneuropathy or lumbosacral radiculopathy.   ___________________________ Narda Amber, DO    Nerve Conduction Studies Anti Sensory Summary Table   Stim Site NR Peak (ms) Norm Peak (ms) P-T Amp (V) Norm P-T Amp  Left Sup Peroneal Anti Sensory (Ant Lat Mall)  32C  12 cm    2.4 <4.6 9.8 >3  Left Sural Anti Sensory (Lat Mall)  32C  Calf    2.7 <4.6 16.6 >3   Motor Summary Table   Stim Site NR Onset (ms) Norm Onset (ms) O-P Amp (mV) Norm O-P Amp Site1 Site2 Delta-0 (ms) Dist (cm) Vel (m/s) Norm Vel (m/s)  Left Peroneal Motor (Ext Dig Brev)  32C  Ankle    3.1 <6.0 2.6 >2.5 B Fib Ankle 8.9 36.0 40 >40  B Fib    12.0  2.5  Poplt B Fib 1.0 8.0 80 >40  Poplt    13.0  2.4         Left Tibial Motor (Abd Hall Brev)  32C  Ankle    2.0 <6.0 4.0 >4 Knee Ankle 10.3 41.0 40 >40  Knee    12.3  3.3          H Reflex Studies   NR H-Lat (ms) Lat Norm (ms) L-R H-Lat (ms)  Left Tibial (Gastroc)  32C     33.47 <35    EMG   Side Muscle Ins Act Fibs Psw  Fasc Number Recrt Dur Dur. Amp Amp. Poly Poly. Comment  Left AntTibialis Nml Nml Nml Nml Nml Nml Nml Nml Nml Nml Nml Nml N/A  Left Gastroc Nml Nml Nml Nml Nml Nml Nml Nml Nml Nml Nml Nml N/A  Left Flex Dig Long Nml Nml Nml Nml Nml Nml Nml Nml Nml Nml Nml Nml N/A  Left RectFemoris Nml Nml Nml Nml Nml Nml Nml Nml Nml Nml Nml Nml N/A  Left GluteusMed Nml Nml Nml Nml Nml Nml Nml Nml Nml Nml Nml Nml N/A      Waveforms:

## 2020-01-24 ENCOUNTER — Other Ambulatory Visit: Payer: Self-pay

## 2020-01-24 ENCOUNTER — Encounter: Payer: Self-pay | Admitting: Podiatry

## 2020-01-24 ENCOUNTER — Ambulatory Visit: Payer: Medicare PPO | Admitting: Podiatry

## 2020-01-24 VITALS — Temp 96.1°F

## 2020-01-24 DIAGNOSIS — M25572 Pain in left ankle and joints of left foot: Secondary | ICD-10-CM | POA: Diagnosis not present

## 2020-01-24 DIAGNOSIS — M779 Enthesopathy, unspecified: Secondary | ICD-10-CM | POA: Diagnosis not present

## 2020-01-24 DIAGNOSIS — M25472 Effusion, left ankle: Secondary | ICD-10-CM

## 2020-01-24 DIAGNOSIS — G8929 Other chronic pain: Secondary | ICD-10-CM

## 2020-01-24 MED ORDER — DICLOFENAC SODIUM 1 % EX GEL: 2.0000 g | Freq: Four times a day (QID) | CUTANEOUS | 2 refills | Status: DC
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
## 2020-02-05 ENCOUNTER — Other Ambulatory Visit: Payer: Self-pay | Admitting: Rheumatology

## 2020-02-07 NOTE — Telephone Encounter (Signed)
Last Visit: 11/19/19 Next Visit: 05/30/20  Okay to refill per Dr. Estanislado Pandy

## 2020-02-12 ENCOUNTER — Other Ambulatory Visit (HOSPITAL_COMMUNITY)
Admission: RE | Admit: 2020-02-12 | Discharge: 2020-02-12 | Disposition: A | Discharge status: Home / Self Care | Payer: Medicare PPO | Source: Ambulatory Visit | Attending: Pulmonary Disease | Admitting: Pulmonary Disease

## 2020-02-12 DIAGNOSIS — Z01812 Encounter for preprocedural laboratory examination: Secondary | ICD-10-CM | POA: Insufficient documentation

## 2020-02-12 DIAGNOSIS — Z20822 Contact with and (suspected) exposure to covid-19: Secondary | ICD-10-CM | POA: Insufficient documentation

## 2020-02-12 LAB — SARS CORONAVIRUS 2 (TAT 6-24 HRS): SARS Coronavirus 2: NEGATIVE

## 2020-02-15 ENCOUNTER — Other Ambulatory Visit: Payer: Self-pay | Admitting: Family Medicine

## 2020-02-15 DIAGNOSIS — Z1231 Encounter for screening mammogram for malignant neoplasm of breast: Secondary | ICD-10-CM

## 2020-02-16 ENCOUNTER — Other Ambulatory Visit: Payer: Self-pay

## 2020-02-16 ENCOUNTER — Ambulatory Visit: Payer: Medicare PPO | Admitting: Pulmonary Disease

## 2020-02-16 ENCOUNTER — Encounter: Payer: Self-pay | Admitting: Pulmonary Disease

## 2020-02-16 VITALS — BP 124/66 | HR 107 | Temp 97.7°F | Ht 65.0 in | Wt 166.6 lb

## 2020-02-16 DIAGNOSIS — R0602 Shortness of breath: Secondary | ICD-10-CM

## 2020-02-16 DIAGNOSIS — R059 Cough, unspecified: Secondary | ICD-10-CM

## 2020-02-16 DIAGNOSIS — R05 Cough: Secondary | ICD-10-CM

## 2020-02-16 MED ORDER — CLOTRIMAZOLE 10 MG MT TROC: 10.0000 mg | Freq: Every day | OROMUCOSAL | 1 refills | Status: DC

## 2020-02-16 MED ORDER — IPRATROPIUM BROMIDE 0.06 % NA SOLN: 2.0000 | Freq: Four times a day (QID) | NASAL | 12 refills | Status: DC

## 2020-02-16 NOTE — Progress Notes (Signed)
Subjective:    Patient ID: [name], female    DOB: [birth year], 69 y.o.   MRN: [id number]  Patient with a protracted cough  Severe nasal stuffiness and congestion Postnasal drip  Was scheduled for PFT today, could not be completed because of persistent coughing   She has not had any fevers or chills Not feeling acutely ill  She has developed a pleural trough since she has been using a nebulizer frequently   Does not want to use a steroid  She does use Restoril and Flexeril for fibromyalgia Admits to snoring One occasion of our granddaughter mentioning possible apneas Admits to dryness of her mouth in the mornings  She quit smoking many years ago, less than a pack a day smoker 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 Anxiety   . Arthritis   . Asthma   . Depression   . Diabetes mellitus without complication ([place])   . Fibromyalgia   . GERD (gastroesophageal reflux disease)   . Hyperlipemia   . Hypertension   . PONV (postoperative nausea and vomiting)    Family History  Problem Relation Age of Onset  . Diabetes Mother   . Heart attack Father   . Diabetes Sister   . Throat cancer Brother   . Hypertension Daughter      Review of Systems  Constitutional: Negative for fever and unexpected weight change.  HENT: Negative for congestion, dental problem, ear pain, nosebleeds, postnasal drip, rhinorrhea, sinus pressure, sneezing, sore throat and trouble swallowing.   Eyes: Negative for redness and itching.  Respiratory: Positive for cough and wheezing. Negative for chest tightness and shortness of breath.   Cardiovascular: Negative for palpitations and leg swelling.  Neurological: Negative for headaches.  Psychiatric/Behavioral: Negative for dysphoric mood. The patient is nervous/anxious.        Objective:   Physical Exam Vitals reviewed.  Constitutional:      Appearance: Normal  appearance.  HENT:     Head: Normocephalic and atraumatic.     Mouth/Throat:     Mouth: Mucous membranes are moist.     Comments: Oral thrush Eyes:     Pupils: Pupils are equal, round, and reactive to light.  Cardiovascular:     Rate and Rhythm: Normal rate and regular rhythm.     Heart sounds: No murmur. No friction rub.  Pulmonary:     Effort: Pulmonary effort is normal. No respiratory distress.     Breath sounds: Normal breath sounds. No stridor. No wheezing or rhonchi.  Musculoskeletal:        General: No swelling or tenderness. Normal range of motion.     Cervical back: Normal range of motion. No rigidity. No muscular tenderness.  Neurological:     Mental Status: She is alert.    Vitals:   02/16/20 1522  BP: 124/66  Pulse: (!) 107  Temp: 97.7 F (36.5 C)  SpO2: 100%   Previous chest x-ray reviewed showing some atelectasis, no clear-cut infiltrate No previous pulmonary function studies    Assessment & Plan:  .  Bronchitis -Better at present  .  Nasal stuffiness and rhinorrhea -Associated cough and congestion  .  Mild to moderate probability of obstructive sleep apnea -Does not want to be evaluated at present  Concern for underlying obstructive lung disease Was unable to perform PFT today  Plan: Atrovent nasal for nasal stuffiness and congestion  Mycelex oral troches for  thrush  Continue bronchodilator treatment as needed  Encourage increase physical activity  I will see her back in the office in about 2 to 3 months Encouraged to call with any significant concerns

## 2020-02-16 NOTE — Patient Instructions (Signed)
Oral thrush  -Mycelex  Nasal stuffiness and congestion -Atrovent nasal  Call with significant concerns  Follow-up in 3 months  Call if you have any fevers or chills or you feel you may need an antibiotic

## 2020-02-18 ENCOUNTER — Telehealth: Payer: Self-pay | Admitting: Pulmonary Disease

## 2020-02-18 NOTE — Telephone Encounter (Signed)
pt called stating that she can't use the nasal spray - ipratropium as it is dilating her pupils and she can't see - Please advise - also stated that Dr. Ander Slade said if she wasn't feeling better in a few days to call back and he would RX an antibiotic - please advise [phone]

## 2020-02-18 NOTE — Telephone Encounter (Signed)
She can stop it then .   If dizzy persists will need to see PCP or urgent care   Please contact office for sooner follow up if symptoms do not improve or worsen or seek emergency care   Will send to DR. Olarere for [employer]

## 2020-02-18 NOTE — Telephone Encounter (Signed)
Called and spoke with pt who stated she believes the ipratropium nasal spray has dilated her pupils. Pt stated she took the nasal spray two days ago on Wednesday 4/21. Pt stated when she woke up on Thursday 4/22, she was feeling dizzy and also noticed that her pupils were dilated. Pt stated she did not take the nasal spray yesterday 4/22 due to symptoms.  Pt also stated she was told by AO at the visit if she wasn't feeling any better that she could call to request an abx to be sent in. Pt is now requesting the abx.  Pt wants to know what we recommend to help with her symptoms. Dr. Jenetta Downer, please advise.

## 2020-02-18 NOTE — Telephone Encounter (Signed)
Called and spoke with pt letting her know that she could stop taking the nasal spray and she verbalized understanding.  Pt is still waiting to hear if an abx could be called in. Dr. Jenetta Downer please advise.

## 2020-02-18 NOTE — Telephone Encounter (Signed)
I am going to route this to APP of the day as well with Dr. Jenetta Downer not being in office today. Tammy, please advise.

## 2020-02-23 MED ORDER — PREDNISONE 10 MG PO TABS: 10.0000 mg | ORAL_TABLET | Freq: Two times a day (BID) | ORAL | 0 refills | Status: DC

## 2020-02-23 MED ORDER — DOXYCYCLINE HYCLATE 100 MG PO TABS: 100.0000 mg | ORAL_TABLET | Freq: Two times a day (BID) | ORAL | 0 refills | Status: DC

## 2020-02-23 NOTE — Telephone Encounter (Signed)
Stop nasal spray as already advised by [name]  Doxycycline 100 p.o. twice daily for 7 days  Prednisone 10 p.o. twice daily for 5 days

## 2020-02-23 NOTE — Telephone Encounter (Signed)
Spoke with the pt and notified of response per Dr Ander Slade. She verbalized understanding. Rxs were sent.

## 2020-02-29 DIAGNOSIS — H25013 Cortical age-related cataract, bilateral: Secondary | ICD-10-CM | POA: Diagnosis not present

## 2020-02-29 DIAGNOSIS — E119 Type 2 diabetes mellitus without complications: Secondary | ICD-10-CM | POA: Diagnosis not present

## 2020-02-29 DIAGNOSIS — H2513 Age-related nuclear cataract, bilateral: Secondary | ICD-10-CM | POA: Diagnosis not present

## 2020-03-06 ENCOUNTER — Other Ambulatory Visit: Payer: Self-pay | Admitting: Rheumatology

## 2020-03-06 NOTE — Telephone Encounter (Signed)
Last Visit: 11/19/2019 telemedicine  Next Visit: 05/30/2020  Okay to refill per Dr. Estanislado Pandy.

## 2020-03-07 DIAGNOSIS — M79672 Pain in left foot: Secondary | ICD-10-CM | POA: Diagnosis not present

## 2020-03-07 DIAGNOSIS — M791 Myalgia, unspecified site: Secondary | ICD-10-CM | POA: Diagnosis not present

## 2020-03-28 ENCOUNTER — Ambulatory Visit: Payer: Medicare PPO | Admitting: Podiatry

## 2020-04-05 ENCOUNTER — Other Ambulatory Visit: Payer: Self-pay | Admitting: Rheumatology

## 2020-04-05 NOTE — Telephone Encounter (Signed)
Last Visit: 11/19/19 Next Visit: 05/30/20  Okay to refill per Dr. Estanislado Pandy

## 2020-04-18 DIAGNOSIS — M791 Myalgia, unspecified site: Secondary | ICD-10-CM | POA: Diagnosis not present

## 2020-04-18 DIAGNOSIS — M79672 Pain in left foot: Secondary | ICD-10-CM | POA: Diagnosis not present

## 2020-05-02 ENCOUNTER — Other Ambulatory Visit: Payer: Self-pay | Admitting: *Deleted

## 2020-05-02 MED ORDER — TEMAZEPAM 15 MG PO CAPS: ORAL_CAPSULE | ORAL | 0 refills | Status: DC

## 2020-05-02 NOTE — Telephone Encounter (Signed)
Refill request received via fax  Last Visit: 11/19/2019 telemedicine  Next Visit: 05/30/2020  Last Fill 01/07/2020   Okay to refill Restoril?

## 2020-05-05 ENCOUNTER — Other Ambulatory Visit: Payer: Self-pay | Admitting: Rheumatology

## 2020-05-05 NOTE — Telephone Encounter (Signed)
Last Visit: 11/19/2019 telemedicine  Next Visit: 05/30/2020  Last Fill: 04/05/2020  Okay to refill Baclofen?

## 2020-05-17 NOTE — Progress Notes (Signed)
Office Visit Note  Patient: [name]             Date of Birth: 1950-11-08           MRN: [id number]             PCP: [name], MD Referring: [name], MD Visit Date: 05/30/2020 Occupation: @GUAROCC @  Subjective:  Pain in multiple joints   History of Present Illness: [name] is a 69 y.o. female with history of fibromyalgia and osteoarthritis. She is having increased neck pain and stiffness.  She has ongoing trochanteric bursitis bilaterally and has had some discomfort in the piriformis region bilaterally.  She has chronic pain in both knee joint but denies any joint swelling.  She has ongoing pain in the left shoulder joint. She is taking Restoril 15 mg 2 capsules at bedtime and gabapentin 300 mg daily for insomnia.  She has ongoing myalgias and muscle tenderness due to fibromyalgia.  She is taking baclofen 10 mg BID PRN for muscle spasms.   Activities of Daily Living:  Patient reports morning stiffness for 1 hour.   Patient Reports nocturnal pain.  Difficulty dressing/grooming: Denies Difficulty climbing stairs: Reports Difficulty getting out of chair: Reports Difficulty using hands for taps, buttons, cutlery, and/or writing: Denies  Review of Systems  Constitutional: Positive for fatigue.  HENT: Positive for mouth dryness. Negative for mouth sores and nose dryness.   Eyes: Negative for pain, itching, visual disturbance and dryness.  Respiratory: Negative for cough, hemoptysis, shortness of breath and difficulty breathing.   Cardiovascular: Negative for chest pain, palpitations, hypertension and swelling in legs/feet.  Gastrointestinal: Negative for blood in stool, constipation and diarrhea.  Endocrine: Negative for increased urination.  Genitourinary: Negative for difficulty urinating and painful urination.  Musculoskeletal: Positive for arthralgias, joint pain, joint swelling, myalgias, morning stiffness, muscle tenderness and myalgias. Negative for  muscle weakness.  Skin: Negative for color change, pallor, rash, hair loss, nodules/bumps, redness, skin tightness, ulcers and sensitivity to sunlight.  Allergic/Immunologic: Negative for susceptible to infections.  Neurological: Positive for dizziness and weakness. Negative for numbness, headaches and memory loss.  Hematological: Negative for bruising/bleeding tendency and swollen glands.  Psychiatric/Behavioral: Negative for depressed mood, confusion and sleep disturbance. The patient is not nervous/anxious.     PMFS History:  Patient Active Problem List   Diagnosis Date Noted  . Fibromyalgia 09/04/2017  . Primary insomnia 09/04/2017  . DDD (degenerative disc disease), cervical 09/04/2017  . DDD (degenerative disc disease), lumbar 09/04/2017  . Primary osteoarthritis of both hands 09/04/2017  . Primary osteoarthritis of both knees 09/04/2017  . History of rotator cuff tear 09/04/2017  . Osteopenia of multiple sites 09/04/2017  . History of diabetes mellitus 09/04/2017  . History of hypertension 09/04/2017  . History of hypercholesterolemia 09/04/2017    Past Medical History:  Diagnosis Date  . Anxiety   . Arthritis   . Asthma   . Depression   . Diabetes mellitus without complication ([place])   . Fibromyalgia   . GERD (gastroesophageal reflux disease)   . Hyperlipemia   . Hypertension   . PONV (postoperative nausea and vomiting)     Family History  Problem Relation Age of Onset  . Diabetes Mother   . Heart attack Father   . Diabetes Sister   . Throat cancer Brother   . Hypertension Daughter    Past Surgical History:  Procedure Laterality Date  . COLONOSCOPY    . DILATION AND CURETTAGE OF UTERUS    .  KNEE ARTHROSCOPY  [id number]   left  . SHOULDER ARTHROSCOPY     left  . TRIGGER FINGER RELEASE  08/13/2012   Procedure: RELEASE TRIGGER FINGER/A-1 PULLEY;  Surgeon: [name]., MD;  Location: [facility];  Service: Orthopedics;  Laterality: Right;   long finger  . TUBAL LIGATION     Social History   Social History Narrative  . Not on file    There is no immunization history on file for this patient.   Objective: Vital Signs: BP 133/81 (BP Location: Right Arm, Patient Position: Sitting, Cuff Size: Normal)   Pulse 79   Resp 14   Ht 5\' 5"  (1.651 m)   Wt 163 lb (73.9 kg)   BMI 27.12 kg/m    Physical Exam Vitals and nursing note reviewed.  Constitutional:      Appearance: She is well-developed.  HENT:     Head: Normocephalic and atraumatic.  Eyes:     Conjunctiva/sclera: Conjunctivae normal.  Pulmonary:     Effort: Pulmonary effort is normal.  Abdominal:     General: Bowel sounds are normal.     Palpations: Abdomen is soft.  Musculoskeletal:     Cervical back: Normal range of motion.  Lymphadenopathy:     Cervical: No cervical adenopathy.  Skin:    General: Skin is warm and dry.     Capillary Refill: Capillary refill takes less than 2 seconds.  Neurological:     Mental Status: She is alert and oriented to person, place, and time.  Psychiatric:        Behavior: Behavior normal.      Musculoskeletal Exam: Generalized hyperalgesia and positive tender points.  C-spine limited ROM with lateral rotation.  Shoulder joints, elbow joints, wrist joints, MCPs, PIPs, and DIPs good ROM with no synovitis.  Complete fist formation bilaterally.  Hip joints good ROM.  Tenderness over trochanteric bursa bilaterally.  Knee joints good ROM with no warmth or effusion.  Tenderness of the left ankle joint.   CDAI Exam: CDAI Score: -- Patient Global: --; Provider Global: -- Swollen: --; Tender: -- Joint Exam 05/30/2020   No joint exam has been documented for this visit   There is currently no information documented on the homunculus. Go to the Rheumatology activity and complete the homunculus joint exam.  Investigation: No additional findings.  Imaging: No results found.  Recent Labs: Lab Results  Component Value Date    WBC 8.2 04/11/2019   HGB 12.0 04/11/2019   PLT 203 04/11/2019   NA 140 04/11/2019   K 4.5 04/11/2019   CL 101 04/11/2019   CO2 30 04/11/2019   GLUCOSE 374 (H) 04/11/2019   BUN 18 04/11/2019   CREATININE 1.24 (H) 04/11/2019   BILITOT 0.3 03/06/2017   ALKPHOS 60 03/06/2017   AST 16 03/06/2017   ALT 11 03/06/2017   PROT 6.5 03/06/2017   ALBUMIN 3.8 03/06/2017   CALCIUM 9.0 04/11/2019   GFRAA 51 (L) 04/11/2019    Speciality Comments: No specialty comments available.  Procedures:  No procedures performed Allergies: Asa [aspirin], Benadryl [diphenhydramine], Darvon [propoxyphene], Percocet [oxycodone-acetaminophen], Sulfa antibiotics, Talwin [pentazocine], and Tetracyclines & related   Assessment / Plan:     Visit Diagnoses: Fibromyalgia: She has generalized hyperalgesia and positive tender points on exam.  She has ongoing myalgias and muscle tenderness.  She takes baclofen 10 mg BID PRN for muscle spasms. She was advised to only take baclofen sparingly and only as needed for muscle spasms.  She was encouraged to perform stretching exercises daily and to use her TENS unit as needed.  She continues to have chronic fatigue secondary to insomnia.  She is taking Restoril 15 mg BID but was advised to reduce the dose to 15 mg 1 tablet by mouth at bedtime as needed.  We discussed the risks of taking Restoril over the age of 53 and she voiced understanding.  We will change the prescription today.  We discussed the importance of regular exercise and good sleep hygiene.  She will follow-up in the office in 6 months.   Primary insomnia - She takes Temazepam 15 mg 2 tablets by mouth at bedtime for insomnia.  She was advised to reduce Restoril to 15 mg 1 tablet by mouth at bedtime as needed.  We discussed the risks of taking Restoril over the age of 26.  Primary osteoarthritis of both hands: She has PIP and DIP thickening consistent with osteoarthritis of both hands.  She has complete fist formation  bilaterally.  Joint protection and muscle strengthening were discussed.  Primary osteoarthritis of both knees: Chronic pain. She has good ROM with no warmth or effusion.   Piriformis syndrome of both sides: She has tenderness to palpation bilaterally.  She was given a handout of exercises to perform.  She is not a good candidate for a cortisone trigger point injections due to her history of DM.   History of rotator cuff tear -  left s/p repair in the past by Dr. Ronnie Derby.  She has chronic pain but has good ROM on exam.   Trapezius muscle spasm: She has trapezius muscle tension and tenderness bilaterally.  She was encouraged to use her TENS unit.  She takes baclofen 10 mg twice daily as needed for muscle spasms but was encouraged to take baclofen sparingly.  We discussed the risks of taking baclofen over the age of 66.  DDD (degenerative disc disease), cervical: She has limited range of motion with lateral rotation.  Trapezius muscle tension and muscle tenderness bilaterally.  She was encouraged to use her TENS unit and work on range of motion exercises.  DDD (degenerative disc disease), lumbar - She has chronic lower back pain and ongoing stiffness.  She has no symptoms of radiculopathy.  She takes baclofen 10 mg BID prn for muscle spasms.  We discussed taking baclofen very sparingly for muscle spasms.  Osteopenia of multiple sites: She is due to update her bone density and will reach out to her PCP to order this.  Other medical conditions are listed as follows:  History of hypercholesterolemia  History of hypertension  History of diabetes mellitus  Orders: No orders of the defined types were placed in this encounter.  Meds ordered this encounter  Medications  . temazepam (RESTORIL) 15 MG capsule    Sig: TAKE 1 CAPSULE BY MOUTH AT BEDTIME AS NEEDED    Dispense:  30 capsule    Refill:  0    Do not fill until 06/02/2020.    Follow-Up Instructions: Return in about 6 months (around  11/30/2020) for Osteoarthritis,FMS.   Bo Merino, MD   Scribed by-  Hazel Sams, PA-C  Note - This record has been created using Dragon software.  Chart creation errors have been sought, but may not always  have been located. Such creation errors do not reflect on  the standard of medical care.

## 2020-05-18 DIAGNOSIS — M791 Myalgia, unspecified site: Secondary | ICD-10-CM | POA: Diagnosis not present

## 2020-05-18 DIAGNOSIS — M79672 Pain in left foot: Secondary | ICD-10-CM | POA: Diagnosis not present

## 2020-05-23 DIAGNOSIS — J019 Acute sinusitis, unspecified: Secondary | ICD-10-CM | POA: Diagnosis not present

## 2020-05-23 DIAGNOSIS — Z7189 Other specified counseling: Secondary | ICD-10-CM | POA: Diagnosis not present

## 2020-05-23 DIAGNOSIS — F419 Anxiety disorder, unspecified: Secondary | ICD-10-CM | POA: Diagnosis not present

## 2020-05-30 ENCOUNTER — Ambulatory Visit: Payer: Medicare PPO | Admitting: Rheumatology

## 2020-05-30 ENCOUNTER — Other Ambulatory Visit: Payer: Self-pay

## 2020-05-30 ENCOUNTER — Encounter: Payer: Self-pay | Admitting: Physician Assistant

## 2020-05-30 VITALS — BP 133/81 | HR 79 | Resp 14 | Ht 65.0 in | Wt 163.0 lb

## 2020-05-30 DIAGNOSIS — Z8679 Personal history of other diseases of the circulatory system: Secondary | ICD-10-CM

## 2020-05-30 DIAGNOSIS — M5030 Other cervical disc degeneration, unspecified cervical region: Secondary | ICD-10-CM | POA: Diagnosis not present

## 2020-05-30 DIAGNOSIS — Z8739 Personal history of other diseases of the musculoskeletal system and connective tissue: Secondary | ICD-10-CM | POA: Diagnosis not present

## 2020-05-30 DIAGNOSIS — G5703 Lesion of sciatic nerve, bilateral lower limbs: Secondary | ICD-10-CM | POA: Diagnosis not present

## 2020-05-30 DIAGNOSIS — M19042 Primary osteoarthritis, left hand: Secondary | ICD-10-CM

## 2020-05-30 DIAGNOSIS — M19041 Primary osteoarthritis, right hand: Secondary | ICD-10-CM

## 2020-05-30 DIAGNOSIS — M8589 Other specified disorders of bone density and structure, multiple sites: Secondary | ICD-10-CM

## 2020-05-30 DIAGNOSIS — M797 Fibromyalgia: Secondary | ICD-10-CM

## 2020-05-30 DIAGNOSIS — M5136 Other intervertebral disc degeneration, lumbar region: Secondary | ICD-10-CM

## 2020-05-30 DIAGNOSIS — M62838 Other muscle spasm: Secondary | ICD-10-CM | POA: Diagnosis not present

## 2020-05-30 DIAGNOSIS — M51369 Other intervertebral disc degeneration, lumbar region without mention of lumbar back pain or lower extremity pain: Secondary | ICD-10-CM

## 2020-05-30 DIAGNOSIS — F5101 Primary insomnia: Secondary | ICD-10-CM

## 2020-05-30 DIAGNOSIS — M170 Bilateral primary osteoarthritis of knee: Secondary | ICD-10-CM | POA: Diagnosis not present

## 2020-05-30 DIAGNOSIS — Z8639 Personal history of other endocrine, nutritional and metabolic disease: Secondary | ICD-10-CM

## 2020-05-30 MED ORDER — TEMAZEPAM 15 MG PO CAPS: ORAL_CAPSULE | ORAL | 0 refills | Status: DC

## 2020-05-30 NOTE — Patient Instructions (Signed)
Piriformis Syndrome Rehab Ask your health care provider which exercises are safe for you. Do exercises exactly as told by your health care provider and adjust them as directed. It is normal to feel mild stretching, pulling, tightness, or discomfort as you do these exercises. Stop right away if you feel sudden pain or your pain gets worse. Do not begin these exercises until told by your health care provider. Stretching and range-of-motion exercises These exercises warm up your muscles and joints and improve the movement and flexibility of your hip and pelvis. The exercises also help to relieve pain, numbness, and tingling. Hip rotation This is an exercise in which you lie on your back and stretch the muscles that rotate your hip (hip rotators) to stretch your buttocks. 1. Lie on your back on a firm surface. 2. Pull your left / right knee toward your same shoulder with your left / right hand until your knee is pointing toward the ceiling. Hold your left / right ankle with your other hand. 3. Keeping your knee steady, gently pull your left / right ankle toward your other shoulder until you feel a stretch in your buttocks. 4. Hold this position for __________ seconds. Repeat __________ times. Complete this exercise __________ times a day. Hip extensor This is an exercise in which you lie on your back and pull your knee to your chest. 1. Lie on your back on a firm surface. Both of your legs should be straight. 2. Pull your left / right knee to your chest. Hold your leg in this position by holding onto the back of your thigh or the front of your knee. 3. Hold this position for __________ seconds. 4. Slowly return to the starting position. Repeat __________ times. Complete this exercise __________ times a day. Strengthening exercises These exercises build strength and endurance in your hip and thigh muscles. Endurance is the ability to use your muscles for a long time, even after they get  tired. Straight leg raises, side-lying This exercise strengthens the muscles that rotate the leg at the hip and move it away from your body (hip abductors). 1. Lie on your side with your left / right leg in the top position. Lie so your head, shoulder, knee, and hip line up. Bend your bottom knee to help you balance. 2. Lift your top leg 4-6 inches (10-15 cm) while keeping your toes pointed straight ahead. 3. Hold this position for __________ seconds. 4. Slowly lower your leg to the starting position. 5. Let your muscles relax completely after each repetition. Repeat __________ times. Complete this exercise __________ times a day. Hip abduction and rotation This is sometimes called quadruped (on hands and knees) exercises. 1. Get on your hands and knees on a firm, lightly padded surface. Your hands should be directly below your shoulders, and your knees should be directly below your hips. 2. Lift your left / right knee out to the side. Keep your knee bent. Do not twist your body. 3. Hold this position for __________ seconds. 4. Slowly lower your leg. Repeat __________ times. Complete this exercise __________ times a day. Straight leg raises, face-down This exercise stretches the muscles that move your hips away from the front of the pelvis (hip extensors). 1. Lie on your abdomen on a bed or a firm surface with a pillow under your hips. 2. Squeeze your buttocks muscles and lift your left / right leg about 4-6 inches (10-15 cm) off the bed. Do not let your back arch. 3. Hold  this position for __________ seconds. 4. Slowly lower your leg to the starting position. 5. Let your muscles relax completely after each repetition. Repeat __________ times. Complete this exercise __________ times a day. This information is not intended to replace advice given to you by your health care provider. Make sure you discuss any questions you have with your health care provider. Document Revised: 02/04/2019  Document Reviewed: 08/06/2018 Elsevier Patient Education  2020 [place].   Hip Bursitis Rehab Ask your health care provider which exercises are safe for you. Do exercises exactly as told by your health care provider and adjust them as directed. It is normal to feel mild stretching, pulling, tightness, or discomfort as you do these exercises. Stop right away if you feel sudden pain or your pain gets worse. Do not begin these exercises until told by your health care provider. Stretching exercise This exercise warms up your muscles and joints and improves the movement and flexibility of your hip. This exercise also helps to relieve pain and stiffness. Iliotibial band stretch An iliotibial band is a strong band of muscle tissue that runs from the outer side of your hip to the outer side of your thigh and knee. 1. Lie on your side with your left / right leg in the top position. 2. Bend your left / right knee and grab your ankle. Stretch out your bottom arm to help you balance. 3. Slowly bring your knee back so your thigh is behind your body. 4. Slowly lower your knee toward the floor until you feel a gentle stretch on the outside of your left / right thigh. If you do not feel a stretch and your knee will not fall farther, place the heel of your other foot on top of your knee and pull your knee down toward the floor with your foot. 5. Hold this position for __________ seconds. 6. Slowly return to the starting position. Repeat __________ times. Complete this exercise __________ times a day. Strengthening exercises These exercises build strength and endurance in your hip and pelvis. Endurance is the ability to use your muscles for a long time, even after they get tired. Bridge This exercise strengthens the muscles that move your thigh backward (hip extensors). 1. Lie on your back on a firm surface with your knees bent and your feet flat on the floor. 2. Tighten your buttocks muscles and lift your  buttocks off the floor until your trunk is level with your thighs. ? Do not arch your back. ? You should feel the muscles working in your buttocks and the back of your thighs. If you do not feel these muscles, slide your feet 1-2 inches (2.5-5 cm) farther away from your buttocks. ? If this exercise is too easy, try doing it with your arms crossed over your chest. 3. Hold this position for __________ seconds. 4. Slowly lower your hips to the starting position. 5. Let your muscles relax completely after each repetition. Repeat __________ times. Complete this exercise __________ times a day. Squats This exercise strengthens the muscles in front of your thigh and knee (quadriceps). 1. Stand in front of a table, with your feet and knees pointing straight ahead. You may rest your hands on the table for balance but not for support. 2. Slowly bend your knees and lower your hips like you are going to sit in a chair. ? Keep your weight over your heels, not over your toes. ? Keep your lower legs upright so they are parallel with the  table legs. ? Do not let your hips go lower than your knees. ? Do not bend lower than told by your health care provider. ? If your hip pain increases, do not bend as low. 3. Hold the squat position for __________ seconds. 4. Slowly push with your legs to return to standing. Do not use your hands to pull yourself to standing. Repeat __________ times. Complete this exercise __________ times a day. Hip hike 1. Stand sideways on a bottom step. Stand on your left / right leg with your other foot unsupported next to the step. You can hold on to the railing or wall for balance if needed. 2. Keep your knees straight and your torso square. Then lift your left / right hip up toward the ceiling. 3. Hold this position for __________ seconds. 4. Slowly let your left / right hip lower toward the floor, past the starting position. Your foot should get closer to the floor. Do not lean or  bend your knees. Repeat __________ times. Complete this exercise __________ times a day. Single leg stand 1. Without shoes, stand near a railing or in a doorway. You may hold on to the railing or door frame as needed for balance. 2. Squeeze your left / right buttock muscles, then lift up your other foot. ? Do not let your left / right hip push out to the side. ? It is helpful to stand in front of a mirror for this exercise so you can watch your hip. 3. Hold this position for __________ seconds. Repeat __________ times. Complete this exercise __________ times a day. This information is not intended to replace advice given to you by your health care provider. Make sure you discuss any questions you have with your health care provider. Document Revised: 02/08/2019 Document Reviewed: 02/08/2019 Elsevier Patient Education  [place].  Neck Exercises Ask your health care provider which exercises are safe for you. Do exercises exactly as told by your health care provider and adjust them as directed. It is normal to feel mild stretching, pulling, tightness, or discomfort as you do these exercises. Stop right away if you feel sudden pain or your pain gets worse. Do not begin these exercises until told by your health care provider. Neck exercises can be important for many reasons. They can improve strength and maintain flexibility in your neck, which will help your upper back and prevent neck pain. Stretching exercises Rotation neck stretching  1. Sit in a chair or stand up. 2. Place your feet flat on the floor, shoulder width apart. 3. Slowly turn your head (rotate) to the right until a slight stretch is felt. Turn it all the way to the right so you can look over your right shoulder. Do not tilt or tip your head. 4. Hold this position for 10-30 seconds. 5. Slowly turn your head (rotate) to the left until a slight stretch is felt. Turn it all the way to the left so you can look over your left  shoulder. Do not tilt or tip your head. 6. Hold this position for 10-30 seconds. Repeat __________ times. Complete this exercise __________ times a day. Neck retraction 1. Sit in a sturdy chair or stand up. 2. Look straight ahead. Do not bend your neck. 3. Use your fingers to push your chin backward (retraction). Do not bend your neck for this movement. Continue to face straight ahead. If you are doing the exercise properly, you will feel a slight sensation in your throat and  a stretch at the back of your neck. 4. Hold the stretch for 1-2 seconds. Repeat __________ times. Complete this exercise __________ times a day. Strengthening exercises Neck press 1. Lie on your back on a firm bed or on the floor with a pillow under your head. 2. Use your neck muscles to push your head down on the pillow and straighten your spine. 3. Hold the position as well as you can. Keep your head facing up (in a neutral position) and your chin tucked. 4. Slowly count to 5 while holding this position. Repeat __________ times. Complete this exercise __________ times a day. Isometrics These are exercises in which you strengthen the muscles in your neck while keeping your neck still (isometrics). 1. Sit in a supportive chair and place your hand on your forehead. 2. Keep your head and face facing straight ahead. Do not flex or extend your neck while doing isometrics. 3. Push forward with your head and neck while pushing back with your hand. Hold for 10 seconds. 4. Do the sequence again, this time putting your hand against the back of your head. Use your head and neck to push backward against the hand pressure. 5. Finally, do the same exercise on either side of your head, pushing sideways against the pressure of your hand. Repeat __________ times. Complete this exercise __________ times a day. Prone head lifts 1. Lie face-down (prone position), resting on your elbows so that your chest and upper back are  raised. 2. Start with your head facing downward, near your chest. Position your chin either on or near your chest. 3. Slowly lift your head upward. Lift until you are looking straight ahead. Then continue lifting your head as far back as you can comfortably stretch. 4. Hold your head up for 5 seconds. Then slowly lower it to your starting position. Repeat __________ times. Complete this exercise __________ times a day. Supine head lifts 1. Lie on your back (supine position), bending your knees to point to the ceiling and keeping your feet flat on the floor. 2. Lift your head slowly off the floor, raising your chin toward your chest. 3. Hold for 5 seconds. Repeat __________ times. Complete this exercise __________ times a day. Scapular retraction 1. Stand with your arms at your sides. Look straight ahead. 2. Slowly pull both shoulders (scapulae) backward and downward (retraction) until you feel a stretch between your shoulder blades in your upper back. 3. Hold for 10-30 seconds. 4. Relax and repeat. Repeat __________ times. Complete this exercise __________ times a day. Contact a health care provider if:  Your neck pain or discomfort gets much worse when you do an exercise.  Your neck pain or discomfort does not improve within 2 hours after you exercise. If you have any of these problems, stop exercising right away. Do not do the exercises again unless your health care provider says that you can. Get help right away if:  You develop sudden, severe neck pain. If this happens, stop exercising right away. Do not do the exercises again unless your health care provider says that you can. This information is not intended to replace advice given to you by your health care provider. Make sure you discuss any questions you have with your health care provider. Document Revised: 08/12/2018 Document Reviewed: 08/12/2018 Elsevier Patient Education  [place].

## 2020-06-04 ENCOUNTER — Other Ambulatory Visit: Payer: Self-pay | Admitting: Physician Assistant

## 2020-06-05 NOTE — Telephone Encounter (Signed)
Last Visit: 05/30/2020 Next Visit: due February 2022. Message sent to the front to schedule.   Last Fill: 05/05/2020  Okay to refill Baclofen?

## 2020-06-18 DIAGNOSIS — M791 Myalgia, unspecified site: Secondary | ICD-10-CM | POA: Diagnosis not present

## 2020-06-18 DIAGNOSIS — M79672 Pain in left foot: Secondary | ICD-10-CM | POA: Diagnosis not present

## 2020-06-26 DIAGNOSIS — E7800 Pure hypercholesterolemia, unspecified: Secondary | ICD-10-CM | POA: Diagnosis not present

## 2020-06-26 DIAGNOSIS — F321 Major depressive disorder, single episode, moderate: Secondary | ICD-10-CM | POA: Diagnosis not present

## 2020-06-26 DIAGNOSIS — F419 Anxiety disorder, unspecified: Secondary | ICD-10-CM | POA: Diagnosis not present

## 2020-06-26 DIAGNOSIS — I129 Hypertensive chronic kidney disease with stage 1 through stage 4 chronic kidney disease, or unspecified chronic kidney disease: Secondary | ICD-10-CM | POA: Diagnosis not present

## 2020-06-26 DIAGNOSIS — K219 Gastro-esophageal reflux disease without esophagitis: Secondary | ICD-10-CM | POA: Diagnosis not present

## 2020-06-26 DIAGNOSIS — E559 Vitamin D deficiency, unspecified: Secondary | ICD-10-CM | POA: Diagnosis not present

## 2020-06-26 DIAGNOSIS — E1121 Type 2 diabetes mellitus with diabetic nephropathy: Secondary | ICD-10-CM | POA: Diagnosis not present

## 2020-06-26 DIAGNOSIS — Z0000 Encounter for general adult medical examination without abnormal findings: Secondary | ICD-10-CM | POA: Diagnosis not present

## 2020-06-26 DIAGNOSIS — Z1389 Encounter for screening for other disorder: Secondary | ICD-10-CM | POA: Diagnosis not present

## 2020-06-27 ENCOUNTER — Other Ambulatory Visit: Payer: Self-pay | Admitting: Family Medicine

## 2020-06-27 DIAGNOSIS — M8580 Other specified disorders of bone density and structure, unspecified site: Secondary | ICD-10-CM

## 2020-07-05 ENCOUNTER — Other Ambulatory Visit: Payer: Self-pay | Admitting: *Deleted

## 2020-07-05 MED ORDER — BACLOFEN 10 MG PO TABS: 10.0000 mg | ORAL_TABLET | Freq: Two times a day (BID) | ORAL | 0 refills | Status: DC | PRN

## 2020-07-05 NOTE — Telephone Encounter (Signed)
Last Visit: 05/30/2020 Next Visit: due February 2022. Message sent to the front to schedule.   Last Fill: 06/05/2020  Okay to refill Baclofen?

## 2020-07-09 ENCOUNTER — Other Ambulatory Visit: Payer: Self-pay | Admitting: Rheumatology

## 2020-07-10 NOTE — Telephone Encounter (Signed)
[facility] for patient to call and schedule follow-up appointment in February 2022.

## 2020-07-10 NOTE — Telephone Encounter (Signed)
Please schedule patient for a follow up visit. Patient due February 2022. Thanks!  

## 2020-07-10 NOTE — Telephone Encounter (Signed)
Last Visit: 05/30/2020 Next Visit: due February 2022. Message sent to the front to schedule.   Last Fill: 05/30/2020  Okay to refill Restoril?

## 2020-07-11 ENCOUNTER — Ambulatory Visit: Payer: Medicare PPO | Admitting: Podiatry

## 2020-07-11 ENCOUNTER — Other Ambulatory Visit: Payer: Self-pay

## 2020-07-11 DIAGNOSIS — M79675 Pain in left toe(s): Secondary | ICD-10-CM

## 2020-07-11 DIAGNOSIS — B351 Tinea unguium: Secondary | ICD-10-CM | POA: Diagnosis not present

## 2020-07-11 DIAGNOSIS — M79674 Pain in right toe(s): Secondary | ICD-10-CM

## 2020-07-11 DIAGNOSIS — Q828 Other specified congenital malformations of skin: Secondary | ICD-10-CM

## 2020-07-17 ENCOUNTER — Ambulatory Visit: Payer: Medicare PPO | Admitting: Podiatry

## 2020-07-18 NOTE — Progress Notes (Signed)
Subjective: 69 year old female presents the office today for concerns of a callus on her right foot is also thickened elongated toenails that she cannot trim her self and causing discomfort.  Denies any redness or drainage from the callus or toenail sites.  Still gets occasional ankle pain but no other changes in regards to that. Denies any systemic complaints such as fevers, chills, nausea, vomiting. No acute changes since last appointment, and no other complaints at this time.   Objective: AAO x3, NAD DP/PT pulses palpable bilaterally, CRT less than 3 seconds Hyperkeratotic lesion right foot submetatarsal area without any underlying ulceration drainage or signs of infection.   Nails are hypertrophic, dystrophic, brittle, discolored, elongated 10. No surrounding redness or drainage. Tenderness nails 1-5 bilaterally. No significant pain to the ankle today.  Occasional discomfort on the course the peroneal tendons on the medial ankle as well but no significant pain today.  No significant edema no erythema. No pain with calf compression, swelling, warmth, erythema  Assessment: Hyperkeratotic lesion, symptomatic onychomycosis  Plan: -All treatment options discussed with the patient including all alternatives, risks, complications.  -Nails sharply debrided x10 without any complications including -Hyperkeratotic lesion sharply debrided x1 without any complications or bleeding -Continue home exercises for the ankle as well as wearing supportive shoes. -Patient encouraged to call the office with any questions, concerns, change in symptoms.   Trula Slade DPM

## 2020-07-19 DIAGNOSIS — M791 Myalgia, unspecified site: Secondary | ICD-10-CM | POA: Diagnosis not present

## 2020-07-19 DIAGNOSIS — M79672 Pain in left foot: Secondary | ICD-10-CM | POA: Diagnosis not present

## 2020-08-03 ENCOUNTER — Other Ambulatory Visit: Payer: Self-pay | Admitting: Physician Assistant

## 2020-08-03 NOTE — Telephone Encounter (Signed)
Last Visit: 05/30/2020 Next Visit: due February 2022. Message sent to the front to schedule.   Last Fill: 07/05/2020  Okay to refill Baclofen?

## 2020-08-03 NOTE — Telephone Encounter (Signed)
Please schedule patient for a follow up visit. Patient due February 2022. Thanks!  

## 2020-08-09 ENCOUNTER — Other Ambulatory Visit: Payer: Self-pay | Admitting: Physician Assistant

## 2020-08-09 NOTE — Telephone Encounter (Signed)
Last Visit: 05/30/2020 Next Visit:due February 2022. Message sent to the front to schedule.   Last Fill:07/10/2020  Okay to refill Restoril?

## 2020-08-11 LAB — PULMONARY FUNCTION TEST
FEF 25-75 Pre: 1.83 L/sec
FEF2575-%Pred-Pre: 106 %
FEV1-%Pred-Pre: 99 %
FEV1-Pre: 1.9 L
FEV1FVC-%Pred-Pre: 103 %
FEV6-%Pred-Pre: 100 %
FEV6-Pre: 2.38 L
FEV6FVC-%Pred-Pre: 103 %
FVC-%Pred-Pre: 97 %
FVC-Pre: 2.38 L
Pre FEV1/FVC ratio: 79 %
Pre FEV6/FVC Ratio: 100 %

## 2020-08-15 ENCOUNTER — Ambulatory Visit: Payer: Medicare PPO | Admitting: Orthotics

## 2020-08-15 ENCOUNTER — Other Ambulatory Visit: Payer: Self-pay

## 2020-08-15 DIAGNOSIS — M79675 Pain in left toe(s): Secondary | ICD-10-CM

## 2020-08-15 DIAGNOSIS — M79674 Pain in right toe(s): Secondary | ICD-10-CM

## 2020-08-15 DIAGNOSIS — E1142 Type 2 diabetes mellitus with diabetic polyneuropathy: Secondary | ICD-10-CM

## 2020-08-15 DIAGNOSIS — Q828 Other specified congenital malformations of skin: Secondary | ICD-10-CM

## 2020-08-15 NOTE — Progress Notes (Signed)

## 2020-08-18 DIAGNOSIS — M791 Myalgia, unspecified site: Secondary | ICD-10-CM | POA: Diagnosis not present

## 2020-08-18 DIAGNOSIS — M79672 Pain in left foot: Secondary | ICD-10-CM | POA: Diagnosis not present

## 2020-08-29 ENCOUNTER — Other Ambulatory Visit: Payer: Self-pay

## 2020-08-29 ENCOUNTER — Ambulatory Visit
Admission: RE | Admit: 2020-08-29 | Discharge: 2020-08-29 | Disposition: A | Discharge status: Home / Self Care | Payer: Medicare PPO | Source: Ambulatory Visit | Attending: Family Medicine | Admitting: Family Medicine

## 2020-08-29 DIAGNOSIS — Z1231 Encounter for screening mammogram for malignant neoplasm of breast: Secondary | ICD-10-CM

## 2020-09-02 ENCOUNTER — Other Ambulatory Visit: Payer: Self-pay | Admitting: Rheumatology

## 2020-09-04 NOTE — Telephone Encounter (Signed)
Last Visit: 05/30/2020 Next Visit:due February 2022. Message sent to the front to schedule.   Last Fill:08/03/2020  Okay to refill Baclofen?

## 2020-09-04 NOTE — Telephone Encounter (Signed)
Please schedule patient for a follow up visit. Patient due February 2022. Thanks!  

## 2020-09-10 ENCOUNTER — Other Ambulatory Visit: Payer: Self-pay | Admitting: Physician Assistant

## 2020-09-11 NOTE — Telephone Encounter (Signed)
Last Visit: 05/30/2020 Next Visit:due February 2022. Message sent to the front to schedule.   Last Fill:08/09/2020  Okay to refill Restoril?

## 2020-09-11 NOTE — Telephone Encounter (Signed)
Please schedule patient for a follow up visit. Patient due February 2022. Thanks!  

## 2020-09-18 DIAGNOSIS — M791 Myalgia, unspecified site: Secondary | ICD-10-CM | POA: Diagnosis not present

## 2020-09-18 DIAGNOSIS — M79672 Pain in left foot: Secondary | ICD-10-CM | POA: Diagnosis not present

## 2020-09-29 ENCOUNTER — Telehealth: Payer: Self-pay | Admitting: Podiatry

## 2020-09-29 NOTE — Telephone Encounter (Signed)
Pt called checking status of diabetic shoes.  Upon checking we did get paperwork but not until 11.16 and the inserts look to be in production but when the shoes/inserts come in I will call pt to schedule an appt.

## 2020-10-02 ENCOUNTER — Other Ambulatory Visit: Payer: Self-pay | Admitting: Physician Assistant

## 2020-10-02 NOTE — Telephone Encounter (Signed)
Last Visit: 05/30/2020 Next Visit: 12/14/2020  Last fill: 09/04/2020  Okay to refill Baclofen?

## 2020-10-10 ENCOUNTER — Other Ambulatory Visit: Payer: Self-pay

## 2020-10-10 ENCOUNTER — Ambulatory Visit: Payer: Medicare PPO | Admitting: Podiatry

## 2020-10-10 DIAGNOSIS — Q828 Other specified congenital malformations of skin: Secondary | ICD-10-CM | POA: Diagnosis not present

## 2020-10-10 DIAGNOSIS — M79675 Pain in left toe(s): Secondary | ICD-10-CM | POA: Diagnosis not present

## 2020-10-10 DIAGNOSIS — M79674 Pain in right toe(s): Secondary | ICD-10-CM | POA: Diagnosis not present

## 2020-10-10 DIAGNOSIS — B351 Tinea unguium: Secondary | ICD-10-CM | POA: Diagnosis not present

## 2020-10-10 DIAGNOSIS — E1142 Type 2 diabetes mellitus with diabetic polyneuropathy: Secondary | ICD-10-CM

## 2020-10-11 ENCOUNTER — Other Ambulatory Visit: Payer: Self-pay | Admitting: Physician Assistant

## 2020-10-12 ENCOUNTER — Other Ambulatory Visit: Payer: Self-pay

## 2020-10-12 ENCOUNTER — Ambulatory Visit: Payer: Medicare PPO | Admitting: Orthotics

## 2020-10-12 NOTE — Progress Notes (Signed)
Subjective: 69 year old female presents the office today for concerns of thick, discolored toenails that she cannot trim her self as well as a callus on her right foot.  There is no other area discomfort.  There is no drainage or open sores that she reports.  She has no other concerns today.  Objective: AAO x3, NAD DP/PT pulses palpable bilaterally, CRT less than 3 seconds Hyperkeratotic lesion right foot submetatarsal without any underlying ulceration drainage or signs of infection.   Nails are hypertrophic, dystrophic, brittle, discolored, elongated 10. No surrounding redness or drainage. Tenderness nails 1-5 bilaterally. No significant pain to the ankle today. MMT 5/5 No pain with calf compression, swelling, warmth, erythema  Assessment: Hyperkeratotic lesion, symptomatic onychomycosis  Plan: -All treatment options discussed with the patient including all alternatives, risks, complications.  -Nails sharply debrided x10 without any complications including -Hyperkeratotic lesion sharply debrided x1 without any complications or bleeding -Patient encouraged to call the office with any questions, concerns, change in symptoms.   Trula Slade DPM

## 2020-10-12 NOTE — Telephone Encounter (Signed)
Last Visit: 05/30/2020 Next Visit: 12/14/2020  Last fill: 09/11/2020  Okay to refill Restoril?

## 2020-10-13 ENCOUNTER — Ambulatory Visit: Payer: Medicare PPO | Admitting: Orthotics

## 2020-10-18 DIAGNOSIS — M791 Myalgia, unspecified site: Secondary | ICD-10-CM | POA: Diagnosis not present

## 2020-10-18 DIAGNOSIS — M79672 Pain in left foot: Secondary | ICD-10-CM | POA: Diagnosis not present

## 2020-10-23 ENCOUNTER — Emergency Department (HOSPITAL_COMMUNITY)
Admission: EM | Admit: 2020-10-23 | Discharge: 2020-10-23 | Disposition: A | Discharge status: Home / Self Care | Payer: Medicare PPO | Attending: Emergency Medicine | Admitting: Emergency Medicine

## 2020-10-23 ENCOUNTER — Emergency Department (HOSPITAL_COMMUNITY): Payer: Medicare PPO

## 2020-10-23 ENCOUNTER — Other Ambulatory Visit: Payer: Self-pay

## 2020-10-23 DIAGNOSIS — Z20822 Contact with and (suspected) exposure to covid-19: Secondary | ICD-10-CM | POA: Diagnosis not present

## 2020-10-23 DIAGNOSIS — R21 Rash and other nonspecific skin eruption: Secondary | ICD-10-CM | POA: Diagnosis not present

## 2020-10-23 DIAGNOSIS — Z79899 Other long term (current) drug therapy: Secondary | ICD-10-CM | POA: Diagnosis not present

## 2020-10-23 DIAGNOSIS — I10 Essential (primary) hypertension: Secondary | ICD-10-CM | POA: Diagnosis not present

## 2020-10-23 DIAGNOSIS — Z87891 Personal history of nicotine dependence: Secondary | ICD-10-CM | POA: Diagnosis not present

## 2020-10-23 DIAGNOSIS — J4541 Moderate persistent asthma with (acute) exacerbation: Secondary | ICD-10-CM | POA: Diagnosis not present

## 2020-10-23 DIAGNOSIS — R059 Cough, unspecified: Secondary | ICD-10-CM | POA: Diagnosis not present

## 2020-10-23 DIAGNOSIS — E119 Type 2 diabetes mellitus without complications: Secondary | ICD-10-CM | POA: Diagnosis not present

## 2020-10-23 LAB — BASIC METABOLIC PANEL
Anion gap: 7 (ref 5–15)
BUN: 12 mg/dL (ref 8–23)
CO2: 28 mmol/L (ref 22–32)
Calcium: 9.2 mg/dL (ref 8.9–10.3)
Chloride: 107 mmol/L (ref 98–111)
Creatinine, Ser: 1.17 mg/dL — ABNORMAL HIGH (ref 0.44–1.00)
GFR, Estimated: 50 mL/min — ABNORMAL LOW (ref >60)
Glucose, Bld: 174 mg/dL — ABNORMAL HIGH (ref 70–99)
Potassium: 4 mmol/L (ref 3.5–5.1)
Sodium: 142 mmol/L (ref 135–145)

## 2020-10-23 LAB — CBC
HCT: 39.8 % (ref 36.0–46.0)
Hemoglobin: 12.8 g/dL (ref 12.0–15.0)
MCH: 26.4 pg (ref 26.0–34.0)
MCHC: 32.2 g/dL (ref 30.0–36.0)
MCV: 82.1 fL (ref 80.0–100.0)
Platelets: 337 10*3/uL (ref 150–400)
RBC: 4.85 MIL/uL (ref 3.87–5.11)
RDW: 13.4 % (ref 11.5–15.5)
WBC: 8.8 10*3/uL (ref 4.0–10.5)
nRBC: 0 % (ref 0.0–0.2)

## 2020-10-23 LAB — RESP PANEL BY RT-PCR (FLU A&B, COVID) ARPGX2
Influenza A by PCR: NEGATIVE
Influenza B by PCR: NEGATIVE
SARS Coronavirus 2 by RT PCR: NEGATIVE

## 2020-10-23 MED ORDER — BENZONATATE 100 MG PO CAPS
100.0000 mg | ORAL_CAPSULE | Freq: Three times a day (TID) | ORAL | 0 refills | Status: DC
Start: 2020-10-23 — End: 2020-12-14

## 2020-10-23 MED ORDER — IPRATROPIUM-ALBUTEROL 0.5-2.5 (3) MG/3ML IN SOLN
3.0000 mL | RESPIRATORY_TRACT | Status: AC
  Administered 2020-10-23 (×3): 3 mL via RESPIRATORY_TRACT
  Filled 2020-10-23: qty 3
  Filled 2020-10-23: qty 6

## 2020-10-23 MED ORDER — DEXAMETHASONE 4 MG PO TABS
10.0000 mg | ORAL_TABLET | Freq: Once | ORAL | Status: AC
  Administered 2020-10-23: 10 mg via ORAL
  Filled 2020-10-23: qty 2

## 2020-10-23 NOTE — ED Triage Notes (Signed)
Patient reports to the ER for Coughing. Patient reports a hx of asthma and bronchitis. Patient reports she has been doing her nebulizer treatments and her albuterol at home without relief.

## 2020-10-23 NOTE — ED Provider Notes (Signed)
[facility] DEPT Provider Note   CSN: [id number] Arrival date & time: 10/23/20  1550     History Chief Complaint  Patient presents with  . Cough    [name] is a 69 y.o. female.  69 yo F with a chief complaints of cough and shortness of breath.  Going on for a few days now.  No fevers.  No known sick contacts.  Has a history of asthma and thinks this is somewhat similar.  Denies sputum production.  Denies chest pain or pressure.  The history is provided by the patient.  Cough Associated symptoms: shortness of breath   Associated symptoms: no chest pain, no chills, no fever, no headaches, no myalgias, no rhinorrhea and no wheezing   Illness Severity:  Moderate Onset quality:  Gradual Duration:  3 days Timing:  Constant Progression:  Worsening Chronicity:  New Associated symptoms: cough and shortness of breath   Associated symptoms: no chest pain, no congestion, no fever, no headaches, no myalgias, no nausea, no rhinorrhea, no vomiting and no wheezing        Past Medical History:  Diagnosis Date  . Anxiety   . Arthritis   . Asthma   . Depression   . Diabetes mellitus without complication ([place])   . Fibromyalgia   . GERD (gastroesophageal reflux disease)   . Hyperlipemia   . Hypertension   . PONV (postoperative nausea and vomiting)     Patient Active Problem List   Diagnosis Date Noted  . Fibromyalgia 09/04/2017  . Primary insomnia 09/04/2017  . DDD (degenerative disc disease), cervical 09/04/2017  . DDD (degenerative disc disease), lumbar 09/04/2017  . Primary osteoarthritis of both hands 09/04/2017  . Primary osteoarthritis of both knees 09/04/2017  . History of rotator cuff tear 09/04/2017  . Osteopenia of multiple sites 09/04/2017  . History of diabetes mellitus 09/04/2017  . History of hypertension 09/04/2017  . History of hypercholesterolemia 09/04/2017    Past Surgical History:  Procedure Laterality Date  .  COLONOSCOPY    . DILATION AND CURETTAGE OF UTERUS    . KNEE ARTHROSCOPY  [id number]   left  . SHOULDER ARTHROSCOPY     left  . TRIGGER FINGER RELEASE  08/13/2012   Procedure: RELEASE TRIGGER FINGER/A-1 PULLEY;  Surgeon: [name]., MD;  Location: [facility];  Service: Orthopedics;  Laterality: Right;  long finger  . TUBAL LIGATION       OB History   No obstetric history on file.     Family History  Problem Relation Age of Onset  . Diabetes Mother   . Heart attack Father   . Diabetes Sister   . Throat cancer Brother   . Hypertension Daughter     Social History   Tobacco Use  . Smoking status: Former Smoker    Packs/day: 0.50    Years: 20.00    Pack years: 10.00    Types: Cigarettes    Quit date: 08/10/2010    Years since quitting: 10.2  . Smokeless tobacco: Never Used  Vaping Use  . Vaping Use: Never used  Substance Use Topics  . Alcohol use: No  . Drug use: No    Home Medications Prior to Admission medications   Medication Sig Start Date End Date Taking? Authorizing Provider  benzonatate (TESSALON) 100 MG capsule Take 1 capsule (100 mg total) by mouth every 8 (eight) hours. 10/23/20  Yes Deno Etienne, DO  albuterol (PROVENTIL HFA;VENTOLIN HFA) 108 (  90 BASE) MCG/ACT inhaler Inhale 1-2 puffs into the lungs every 4 (four) hours as needed for wheezing or shortness of breath.     [provider]  ALPRAZolam Duanne Moron) 0.25 MG tablet Take 0.25 mg by mouth daily as needed for anxiety. 02/19/19   [provider]  baclofen (LIORESAL) 10 MG tablet TAKE 1 TABLET(10 MG) BY MOUTH TWICE DAILY AS NEEDED FOR MUSCLE SPASMS 10/02/20   Ofilia Neas, PA-C  clotrimazole (MYCELEX) 10 MG troche Take 1 tablet (10 mg total) by mouth 5 (five) times daily. 02/16/20   Sherrilyn Rist A, MD  diclofenac Sodium (VOLTAREN) 1 % GEL Apply 2 g topically 4 (four) times daily. Rub into affected area of foot 2 to 4 times daily 01/24/20   Trula Slade, DPM   Elastic Bandages & Supports (MEDICAL COMPRESSION STOCKINGS) MISC 1 each by Does not apply route every 6 (six) hours as needed (swelling). 04/28/19   Wieters, Hallie C, PA-C  gabapentin (NEURONTIN) 300 MG capsule Take 300 mg by mouth daily. 03/11/19   [provider]  hydrochlorothiazide (HYDRODIURIL) 12.5 MG tablet Take 12.5 mg by mouth daily. 02/10/19   [provider]  ipratropium (ATROVENT) 0.06 % nasal spray Place 2 sprays into both nostrils 4 (four) times daily. 02/16/20   Olalere, Ernesto Rutherford, MD  LANTUS SOLOSTAR 100 UNIT/ML Solostar Pen Inject 22 Units into the skin at bedtime. 12/09/18   [provider]  pantoprazole (PROTONIX) 40 MG tablet Take 40 mg by mouth daily.    [provider]  repaglinide (PRANDIN) 2 MG tablet Take 2 mg by mouth 2 (two) times daily before a meal.    [provider]  simvastatin (ZOCOR) 40 MG tablet Take 40 mg by mouth every evening.    [provider]  temazepam (RESTORIL) 15 MG capsule TAKE 1 CAPSULE BY MOUTH AT BEDTIME AS NEEDED 10/12/20   Ofilia Neas, PA-C  Vitamin D, Ergocalciferol, (DRISDOL) 1.25 MG (50000 UNIT) CAPS capsule  06/29/20   [provider]    Allergies    Asa [aspirin], Benadryl [diphenhydramine], Darvon [propoxyphene], Percocet [oxycodone-acetaminophen], Sulfa antibiotics, Talwin [pentazocine], and Tetracyclines & related  Review of Systems   Review of Systems  Constitutional: Negative for chills and fever.  HENT: Negative for congestion and rhinorrhea.   Eyes: Negative for redness and visual disturbance.  Respiratory: Positive for cough and shortness of breath. Negative for wheezing.   Cardiovascular: Negative for chest pain and palpitations.  Gastrointestinal: Negative for nausea and vomiting.  Genitourinary: Negative for dysuria and urgency.  Musculoskeletal: Negative for arthralgias and myalgias.  Skin: Negative for pallor and wound.  Neurological: Negative for dizziness and  headaches.    Physical Exam Updated Vital Signs BP 120/87   Pulse 92   Temp 98.6 F (37 C) (Oral)   Resp 18   SpO2 96%   Physical Exam Vitals and nursing note reviewed.  Constitutional:      General: She is not in acute distress.    Appearance: She is well-developed and well-nourished. She is not diaphoretic.  HENT:     Head: Normocephalic and atraumatic.  Eyes:     Extraocular Movements: EOM normal.     Pupils: Pupils are equal, round, and reactive to light.  Cardiovascular:     Rate and Rhythm: Normal rate and regular rhythm.     Heart sounds: No murmur heard. No friction rub. No gallop.   Pulmonary:     Effort: Pulmonary effort is normal.  Breath sounds: No wheezing or rales.     Comments: Diminished breath sounds.  Bronchospastic cough. Abdominal:     General: There is no distension.     Palpations: Abdomen is soft.     Tenderness: There is no abdominal tenderness.  Musculoskeletal:        General: No tenderness or edema.     Cervical back: Normal range of motion and neck supple.  Skin:    General: Skin is warm and dry.  Neurological:     Mental Status: She is alert and oriented to person, place, and time.  Psychiatric:        Mood and Affect: Mood and affect normal.        Behavior: Behavior normal.     ED Results / Procedures / Treatments   Labs (all labs ordered are listed, but only abnormal results are displayed) Labs Reviewed  BASIC METABOLIC PANEL - Abnormal; Notable for the following components:      Result Value   Glucose, Bld 174 (*)    Creatinine, Ser 1.17 (*)    GFR, Estimated 50 (*)    All other components within normal limits  RESP PANEL BY RT-PCR (FLU A&B, COVID) ARPGX2  CBC    EKG None  Radiology DG Chest 2 View  Result Date: 10/23/2020 CLINICAL DATA:  69 year old female with cough. EXAM: CHEST - 2 VIEW COMPARISON:  Chest radiograph dated 05/26/2019. FINDINGS: Mild chronic bronchitic changes. No focal consolidation, pleural  effusion, or pneumothorax. The cardiac silhouette is within limits. No acute osseous pathology. IMPRESSION: No active cardiopulmonary disease. Electronically Signed   By: Anner Crete M.D.   On: 10/23/2020 16:45    Procedures Procedures (including critical care time)  Medications Ordered in ED Medications  dexamethasone (DECADRON) tablet 10 mg (10 mg Oral Given 10/23/20 1938)  ipratropium-albuterol (DUONEB) 0.5-2.5 (3) MG/3ML nebulizer solution 3 mL (3 mLs Nebulization Given 10/23/20 1940)    ED Course  I have reviewed the triage vital signs and the nursing notes.  Pertinent labs & imaging results that were available during my care of the patient were reviewed by me and considered in my medical decision making (see chart for details).    MDM Rules/Calculators/A&P                          69 yo F with a chief complaints of cough and shortness of breath.  Has a history of asthma and thinks is the same.  Diminished breath sounds for me.  Chest x-ray without focal infiltrate.  The patient was given 3 duo nebs back-to-back and Decadron.  On reassessment the patient is feeling somewhat better.  She has better aeration on my exam and now I can hear end expiratory wheezes.  She is somewhat tremulous now and mildly tachycardic.  She is feeling better and would like to try and go home.  We will have her use her albuterol inhaler every 4 hours while awake for the next 48 hours.  Have her follow-up with her doctor.  8:59 PM:  I have discussed the diagnosis/risks/treatment options with the patient and believe the pt to be eligible for discharge home to follow-up with PCP. We also discussed returning to the ED immediately if new or worsening sx occur. We discussed the sx which are most concerning (e.g., sudden worsening sob, need to use inhaler more often than every 4 hours, fever, inability to tolerate by mouth) that necessitate immediate return. Medications  administered to the patient during their  visit and any new prescriptions provided to the patient are listed below.  Medications given during this visit Medications  dexamethasone (DECADRON) tablet 10 mg (10 mg Oral Given 10/23/20 1938)  ipratropium-albuterol (DUONEB) 0.5-2.5 (3) MG/3ML nebulizer solution 3 mL (3 mLs Nebulization Given 10/23/20 1940)     The patient appears reasonably screen and/or stabilized for discharge and I doubt any other medical condition or other [facility] requiring further screening, evaluation, or treatment in the ED at this time prior to discharge.   Final Clinical Impression(s) / ED Diagnoses Final diagnoses:  Moderate persistent asthma with exacerbation    Rx / DC Orders ED Discharge Orders         Ordered    benzonatate (TESSALON) 100 MG capsule  Every 8 hours        10/23/20 2058           Deno Etienne, DO 10/23/20 2059

## 2020-10-23 NOTE — Discharge Instructions (Signed)
Use your inhaler every 4 hours(6 puffs) while awake, return for sudden worsening shortness of breath, or if you need to use your inhaler more often.  ° °

## 2020-10-26 DIAGNOSIS — J069 Acute upper respiratory infection, unspecified: Secondary | ICD-10-CM | POA: Diagnosis not present

## 2020-11-01 ENCOUNTER — Other Ambulatory Visit: Payer: Self-pay | Admitting: Physician Assistant

## 2020-11-01 NOTE — Telephone Encounter (Signed)
Last Visit: 05/30/2020 Next Visit: 12/15/2019  Current Dose per office note on 05/30/2020, baclofen 10 mg BID PRN for muscle spasms Dx:  Fibromyalgia  Okay to refill Baclofen?

## 2020-11-13 ENCOUNTER — Other Ambulatory Visit: Payer: Self-pay | Admitting: Physician Assistant

## 2020-11-14 NOTE — Telephone Encounter (Signed)
Last Visit: 05/30/2020 Next Visit: 12/15/2019  Current Dose per office note on 05/30/2020: Temazepam 15 mg 2 tablets by mouth at bedtime Dx: Primary insomnia   Okay to refill Restoril?

## 2020-11-14 NOTE — Telephone Encounter (Signed)
Please advice patient to not take Xanax with Temazepam as it can cause respiratory depression.

## 2020-11-14 NOTE — Telephone Encounter (Signed)
Attempted to contact the patient and left message for patient to call the office.  

## 2020-11-15 ENCOUNTER — Telehealth: Payer: Self-pay | Admitting: Rheumatology

## 2020-11-15 NOTE — Telephone Encounter (Signed)
Patient left a message stating she would like to get a refill on her Temazepam before the bad weather comes in. Patient fills it at [facility] on [address].

## 2020-11-15 NOTE — Telephone Encounter (Signed)
Patient advised to not take Xanax with Temazepam as it can cause respiratory depression. Patient expressed understanding. Patient states she only uses the Xanax on an as needed basis and only during the day.

## 2020-11-18 DIAGNOSIS — M79672 Pain in left foot: Secondary | ICD-10-CM | POA: Diagnosis not present

## 2020-11-18 DIAGNOSIS — M791 Myalgia, unspecified site: Secondary | ICD-10-CM | POA: Diagnosis not present

## 2020-11-21 ENCOUNTER — Encounter: Payer: Self-pay | Admitting: Nurse Practitioner

## 2020-11-21 ENCOUNTER — Telehealth (HOSPITAL_COMMUNITY): Payer: Self-pay

## 2020-11-21 ENCOUNTER — Other Ambulatory Visit (INDEPENDENT_AMBULATORY_CARE_PROVIDER_SITE_OTHER): Payer: Medicare PPO | Admitting: Nurse Practitioner

## 2020-11-21 DIAGNOSIS — E119 Type 2 diabetes mellitus without complications: Secondary | ICD-10-CM | POA: Insufficient documentation

## 2020-11-21 DIAGNOSIS — K5730 Diverticulosis of large intestine without perforation or abscess without bleeding: Secondary | ICD-10-CM | POA: Insufficient documentation

## 2020-11-21 DIAGNOSIS — I890 Lymphedema, not elsewhere classified: Secondary | ICD-10-CM | POA: Insufficient documentation

## 2020-11-21 DIAGNOSIS — M2550 Pain in unspecified joint: Secondary | ICD-10-CM | POA: Insufficient documentation

## 2020-11-21 DIAGNOSIS — G629 Polyneuropathy, unspecified: Secondary | ICD-10-CM | POA: Insufficient documentation

## 2020-11-21 DIAGNOSIS — E1121 Type 2 diabetes mellitus with diabetic nephropathy: Secondary | ICD-10-CM

## 2020-11-21 DIAGNOSIS — J301 Allergic rhinitis due to pollen: Secondary | ICD-10-CM | POA: Insufficient documentation

## 2020-11-21 DIAGNOSIS — M109 Gout, unspecified: Secondary | ICD-10-CM | POA: Insufficient documentation

## 2020-11-21 DIAGNOSIS — K219 Gastro-esophageal reflux disease without esophagitis: Secondary | ICD-10-CM | POA: Insufficient documentation

## 2020-11-21 DIAGNOSIS — U071 COVID-19: Secondary | ICD-10-CM

## 2020-11-21 DIAGNOSIS — E1169 Type 2 diabetes mellitus with other specified complication: Secondary | ICD-10-CM | POA: Insufficient documentation

## 2020-11-21 DIAGNOSIS — E7800 Pure hypercholesterolemia, unspecified: Secondary | ICD-10-CM | POA: Insufficient documentation

## 2020-11-21 DIAGNOSIS — L309 Dermatitis, unspecified: Secondary | ICD-10-CM | POA: Insufficient documentation

## 2020-11-21 DIAGNOSIS — J4520 Mild intermittent asthma, uncomplicated: Secondary | ICD-10-CM | POA: Insufficient documentation

## 2020-11-21 DIAGNOSIS — E559 Vitamin D deficiency, unspecified: Secondary | ICD-10-CM | POA: Insufficient documentation

## 2020-11-21 DIAGNOSIS — F419 Anxiety disorder, unspecified: Secondary | ICD-10-CM | POA: Insufficient documentation

## 2020-11-21 DIAGNOSIS — N182 Chronic kidney disease, stage 2 (mild): Secondary | ICD-10-CM

## 2020-11-21 DIAGNOSIS — F39 Unspecified mood [affective] disorder: Secondary | ICD-10-CM | POA: Insufficient documentation

## 2020-11-21 DIAGNOSIS — Z794 Long term (current) use of insulin: Secondary | ICD-10-CM | POA: Insufficient documentation

## 2020-11-21 DIAGNOSIS — F321 Major depressive disorder, single episode, moderate: Secondary | ICD-10-CM | POA: Insufficient documentation

## 2020-11-21 MED ORDER — MOLNUPIRAVIR EUA 200MG CAPSULE: 4.0000 | ORAL_CAPSULE | Freq: Two times a day (BID) | ORAL | 0 refills | Status: AC

## 2020-11-21 MED FILL — MOLNUPIRAVIR 200 MG CAPS: 200 | 5 days supply | Qty: 40 | Fill #0

## 2020-11-21 NOTE — Telephone Encounter (Signed)
Patient was prescribed oral covid treatment Molnupiravir and treatment note was reviewed. Medication has been received by [facility] and reviewed for appropriateness.  Drug Interactions or Dosage Adjustments Noted: none  Delivery Method: pick up  Patient contacted for counseling on 11/21/20 and verbalized understanding.   Delivery or Pick-Up Date: 11/21/20   Alinda Dooms 11/21/2020, 9:29 AM [facility] Outpatient Pharmacist Phone# [phone]

## 2020-11-21 NOTE — Progress Notes (Signed)
Outpatient Oral COVID Treatment Note  I connected with [name] on 11/21/2020/9:04 AM by telephone and verified that I am speaking with the correct person using two identifiers.  I discussed the limitations, risks, security, and privacy concerns of performing an evaluation and management service by telephone and the availability of in person appointments. I also discussed with the patient that there may be a patient responsible charge related to this service. The patient expressed understanding and agreed to proceed.  Patient location: At home Provider location: In my office Patient and nurse practitioner listed participated in call.   Diagnosis: COVID-19 infection  Purpose of visit: Discussion of potential use of Molnupiravir or Paxlovid, a new treatment for mild to moderate COVID-19 viral infection in non-hospitalized patients.   Subjective: Patient is a 70 y.o. female who has been diagnosed with COVID 19 viral infection.  Their symptoms began on 11/19/2020 with fatigue, lightheadedness, decreased appetite, body aches. She was exposed to her brother who was later found to be COVID positive and she was tested. She was referred to the infusion center by Dr. Quin Hoop office, her PCP.   Past Medical History:  Diagnosis Date  . Anxiety   . Arthritis   . Asthma   . Depression   . Diabetes mellitus without complication ([place])   . Fibromyalgia   . GERD (gastroesophageal reflux disease)   . Hyperlipemia   . Hypertension   . PONV (postoperative nausea and vomiting)     Allergies  Allergen Reactions  . Aspirin Nausea And Vomiting    Other reaction(s): Unknown  . Benadryl [Diphenhydramine]     Can only take dye free  . Darvon [Propoxyphene] Itching    Can only during the day. Night time makes her itch  . Hydrocodone-Homatropine Itching  . Metformin     Other reaction(s): GI  . Other     Other reaction(s): Unknown  . Pentazocine     nervous Other reaction(s): jitters  .  Percocet [Oxycodone-Acetaminophen]     itching  . Sulfa Antibiotics Hives  . Tetracaine Hcl     Other reaction(s): Unknown  . Tetracyclines & Related Hives  . Tramadol     Other reaction(s): itch     Current Outpatient Medications:  .  betamethasone dipropionate (DIPROLENE) 0.05 % ointment, 1 application to affected area, Disp: , Rfl:  .  FLUoxetine (PROZAC) 10 MG tablet, 1/2 tab, Disp: , Rfl:  .  fluticasone (FLONASE ALLERGY RELIEF) 50 MCG/ACT nasal spray, 2 sprays, Disp: , Rfl:  .  glucose blood test strip, See admin instructions., Disp: , Rfl:  .  Insulin Glargine (BASAGLAR KWIKPEN) 100 UNIT/ML, See admin instructions., Disp: , Rfl:  .  OneTouch Delica Lancets 84X MISC, Use to check blood sugar once a day for Diabetes; DX Code: E11.69, Disp: , Rfl:  .  Promethazine-Codeine 6.25-10 MG/5ML SOLN, 5 ml, Disp: , Rfl:  .  acetaminophen-codeine (TYLENOL #3) 300-30 MG tablet, Take 1 tablet by mouth every 6 (six) hours as needed., Disp: , Rfl:  .  albuterol (PROVENTIL HFA;VENTOLIN HFA) 108 (90 BASE) MCG/ACT inhaler, Inhale 1-2 puffs into the lungs every 4 (four) hours as needed for wheezing or shortness of breath. , Disp: , Rfl:  .  ALPRAZolam (XANAX) 0.25 MG tablet, Take 0.25 mg by mouth daily as needed for anxiety., Disp: , Rfl:  .  amoxicillin (AMOXIL) 500 MG tablet, Take 500 mg by mouth 2 (two) times daily., Disp: , Rfl:  .  baclofen (LIORESAL) 10  MG tablet, TAKE 1 TABLET(10 MG) BY MOUTH TWICE DAILY AS NEEDED FOR MUSCLE SPASMS, Disp: 60 tablet, Rfl: 0 .  BD PEN NEEDLE NANO 2ND GEN 32G X 4 MM MISC, USE DAILY WITH INSULIN PEN ONCE A DAY, Disp: , Rfl:  .  benzonatate (TESSALON) 100 MG capsule, Take 1 capsule (100 mg total) by mouth every 8 (eight) hours., Disp: 21 capsule, Rfl: 0 .  benzonatate (TESSALON) 100 MG capsule, 1 capsule as needed, Disp: , Rfl:  .  clotrimazole (MYCELEX) 10 MG troche, Take 1 tablet (10 mg total) by mouth 5 (five) times daily., Disp: 35 Troche, Rfl: 1 .  diclofenac  Sodium (VOLTAREN) 1 % GEL, Apply 2 g topically 4 (four) times daily. Rub into affected area of foot 2 to 4 times daily, Disp: 100 g, Rfl: 2 .  diphenhydrAMINE HCl (BENADRYL ALLERGY PO), , Disp: , Rfl:  .  Elastic Bandages & Supports ([place]) MISC, 1 each by Does not apply route every 6 (six) hours as needed (swelling)., Disp: 2 each, Rfl: 0 .  gabapentin (NEURONTIN) 300 MG capsule, Take 300 mg by mouth daily., Disp: , Rfl:  .  gabapentin (NEURONTIN) 300 MG capsule, Take 1 capsule by mouth 2 (two) times daily., Disp: , Rfl:  .  hydrochlorothiazide (HYDRODIURIL) 12.5 MG tablet, Take 12.5 mg by mouth daily., Disp: , Rfl:  .  Insulin Pen Needle (BD PEN NEEDLE NANO U/F) 32G X 4 MM MISC, to use daily with insulin pen, Disp: , Rfl:  .  ipratropium (ATROVENT) 0.06 % nasal spray, Place 2 sprays into both nostrils 4 (four) times daily., Disp: 15 mL, Rfl: 12 .  LANTUS SOLOSTAR 100 UNIT/ML Solostar Pen, Inject 22 Units into the skin at bedtime., Disp: , Rfl:  .  metaxalone (SKELAXIN) 800 MG tablet, 1 tablet, Disp: , Rfl:  .  pantoprazole (PROTONIX) 40 MG tablet, Take 40 mg by mouth daily., Disp: , Rfl:  .  pantoprazole (PROTONIX) 40 MG tablet, 1 tablet, Disp: , Rfl:  .  repaglinide (PRANDIN) 2 MG tablet, Take 2 mg by mouth 2 (two) times daily before a meal., Disp: , Rfl:  .  simvastatin (ZOCOR) 40 MG tablet, Take 40 mg by mouth every evening., Disp: , Rfl:  .  simvastatin (ZOCOR) 40 MG tablet, Take 1 tablet by mouth daily., Disp: , Rfl:  .  temazepam (RESTORIL) 15 MG capsule, TAKE 1 CAPSULE BY MOUTH AT BEDTIME AS NEEDED, Disp: 30 capsule, Rfl: 0 .  temazepam (RESTORIL) 15 MG capsule, 1 capsule at bedtime, Disp: , Rfl:  .  Vitamin D, Ergocalciferol, (DRISDOL) 1.25 MG (50000 UNIT) CAPS capsule, , Disp: , Rfl:   Objective: Patient appears/sounds well- no shortness of breath, able to speak in complete sentences. She is audibly congested and a cough is present.  They are in no apparent  distress.  Breathing is non labored.  Mood and behavior are normal.  Laboratory Data:  Recent Results (from the past 2160 hour(s))  Resp Panel by RT-PCR (Flu A&B, Covid) Nasopharyngeal Swab     Status: None   Collection Time: 10/23/20  4:26 PM   Specimen: Nasopharyngeal Swab; Nasopharyngeal(NP) swabs in vial transport medium  Result Value Ref Range   SARS Coronavirus 2 by RT PCR NEGATIVE NEGATIVE    Comment: (NOTE) SARS-CoV-2 target nucleic acids are NOT DETECTED.  The SARS-CoV-2 RNA is generally detectable in upper respiratory specimens during the acute phase of infection. The lowest concentration of SARS-CoV-2 viral copies this assay  can detect is 138 copies/mL. A negative result does not preclude SARS-Cov-2 infection and should not be used as the sole basis for treatment or other patient management decisions. A negative result may occur with  improper specimen collection/handling, submission of specimen other than nasopharyngeal swab, presence of viral mutation(s) within the areas targeted by this assay, and inadequate number of viral copies(<138 copies/mL). A negative result must be combined with clinical observations, patient history, and epidemiological information. The expected result is Negative.  Fact Sheet for Patients:  EntrepreneurPulse.com.au  Fact Sheet for Healthcare Providers:  IncredibleEmployment.be  This test is no t yet approved or cleared by the [place] FDA and  has been authorized for detection and/or diagnosis of SARS-CoV-2 by FDA under an Emergency Use Authorization (EUA). This EUA will remain  in effect (meaning this test can be used) for the duration of the COVID-19 declaration under Section 564(b)(1) of the Act, 21 U.S.C.section 360bbb-3(b)(1), unless the authorization is terminated  or revoked sooner.       Influenza A by PCR NEGATIVE NEGATIVE   Influenza B by PCR NEGATIVE NEGATIVE    Comment: (NOTE) The  Xpert Xpress SARS-CoV-2/FLU/RSV plus assay is intended as an aid in the diagnosis of influenza from Nasopharyngeal swab specimens and should not be used as a sole basis for treatment. Nasal washings and aspirates are unacceptable for Xpert Xpress SARS-CoV-2/FLU/RSV testing.  Fact Sheet for Patients: EntrepreneurPulse.com.au  Fact Sheet for Healthcare Providers: IncredibleEmployment.be  This test is not yet approved or cleared by the [place] FDA and has been authorized for detection and/or diagnosis of SARS-CoV-2 by FDA under an Emergency Use Authorization (EUA). This EUA will remain in effect (meaning this test can be used) for the duration of the COVID-19 declaration under Section 564(b)(1) of the Act, 21 U.S.C. section 360bbb-3(b)(1), unless the authorization is terminated or revoked.  Performed at [facility], [place] [address], [place], [place] [address]   Basic metabolic panel     Status: Abnormal   Collection Time: 10/23/20  4:50 PM  Result Value Ref Range   Sodium 142 135 - 145 mmol/L   Potassium 4.0 3.5 - 5.1 mmol/L   Chloride 107 98 - 111 mmol/L   CO2 28 22 - 32 mmol/L   Glucose, Bld 174 (H) 70 - 99 mg/dL    Comment: Glucose reference range applies only to samples taken after fasting for at least 8 hours.   BUN 12 8 - 23 mg/dL   Creatinine, Ser 1.17 (H) 0.44 - 1.00 mg/dL   Calcium 9.2 8.9 - 10.3 mg/dL   GFR, Estimated 50 (L) >60 mL/min    Comment: (NOTE) Calculated using the CKD-EPI Creatinine Equation (2021)    Anion gap 7 5 - 15    Comment: Performed at [facility], [place] [address] [place], [place] [address]  CBC     Status: None   Collection Time: 10/23/20  4:50 PM  Result Value Ref Range   WBC 8.8 4.0 - 10.5 K/uL   RBC 4.85 3.87 - 5.11 MIL/uL   Hemoglobin 12.8 12.0 - 15.0 g/dL   HCT 39.8 36.0 - 46.0 %   MCV 82.1 80.0 - 100.0 fL   MCH 26.4 26.0 - 34.0 pg   MCHC 32.2 30.0 - 36.0  g/dL   RDW 13.4 11.5 - 15.5 %   Platelets 337 150 - 400 K/uL   nRBC 0.0 0.0 - 0.2 %    Comment: Performed at [facility], [place] [address], [place], [place] [address]     Assessment: 70 y.o. female with mild/moderate COVID 19 viral infection diagnosed on 11/20/2020 at high risk for progression to severe COVID 19.  Plan:  This patient is a 70 y.o. female that meets the following criteria for Emergency Use Authorization of: Molnupiravir  1. Age >18 yr 2. SARS-COV-2 positive test 3. Symptom onset < 5 days 4. Mild-to-moderate COVID disease with high risk for severe progression to hospitalization or death   I have spoken and communicated the following to the patient or parent/caregiver regarding: 1. Molnupiravir is an unapproved drug that is authorized for use under an Print production planner.  2. There are no adequate, approved, available products for the treatment of COVID-19 in adults who have mild-to-moderate COVID-19 and are at high risk for progressing to severe COVID-19, including hospitalization or death. 3. Other therapeutics are currently authorized. For additional information on all products authorized for treatment or prevention of COVID-19, please see TanEm[employer]ium.pl.  4. There are benefits and risks of taking this treatment as outlined in the "Fact Sheet for Patients and Caregivers."  5. "Fact Sheet for Patients and Caregivers" was reviewed with patient. A hard copy will be provided to patient from pharmacy prior to the patient receiving treatment. 6. Patients should continue to self-isolate and use infection control measures (e.g., wear mask, isolate, social distance, avoid sharing personal items, clean and disinfect "high touch" surfaces, and frequent handwashing) according to CDC guidelines.  7. The patient or parent/caregiver has the option to accept  or refuse treatment. 8. [place] has established a pregnancy surveillance program. 9. Females of childbearing potential should use a reliable method of contraception correctly and consistently, as applicable, for the duration of treatment and for 4 days after the last dose of Molnupiravir. 48. Males of reproductive potential who are sexually active with females of childbearing potential should use a reliable method of contraception correctly and consistently during treatment and for at least 3 months after the last dose. 11. Pregnancy status and risk was assessed. Patient verbalized understanding of precautions.   After reviewing above information with the patient, the patient agrees to receive molnupiravir.  Follow up instructions:    . Take prescription BID x 5 days as directed . Reach out to pharmacist for counseling on medication if desired . For concerns regarding further COVID symptoms please follow up with your PCP or urgent care . For urgent or life-threatening issues, seek care at your local emergency department  The patient was provided an opportunity to ask questions, and all were answered. The patient agreed with the plan and demonstrated an understanding of the instructions.   Script sent to [facility] and opted to pick up RX.  The patient was advised to call their PCP or seek an in-person evaluation if the symptoms worsen or if the condition fails to improve as anticipated.   I provided 20 minutes of non face-to-face telephone visit time during this encounter, and > 50% was spent counseling as documented under my assessment & plan.  Orma Render, NP 11/21/2020 /9:04 AM

## 2020-11-24 ENCOUNTER — Other Ambulatory Visit: Payer: Medicare PPO

## 2020-11-30 DIAGNOSIS — Z8619 Personal history of other infectious and parasitic diseases: Secondary | ICD-10-CM | POA: Diagnosis not present

## 2020-11-30 DIAGNOSIS — F4321 Adjustment disorder with depressed mood: Secondary | ICD-10-CM | POA: Diagnosis not present

## 2020-11-30 DIAGNOSIS — R0602 Shortness of breath: Secondary | ICD-10-CM | POA: Diagnosis not present

## 2020-11-30 DIAGNOSIS — E1121 Type 2 diabetes mellitus with diabetic nephropathy: Secondary | ICD-10-CM | POA: Diagnosis not present

## 2020-11-30 NOTE — Progress Notes (Signed)
Office Visit Note  Patient: [name]             Date of Birth: November 15, 1950           MRN: [id number]             PCP: [name], MD Referring: [name], MD Visit Date: 12/14/2020 Occupation: @GUAROCC @  Subjective:  Insomnia and generalized pain   History of Present Illness: [name] is a 70 y.o. female with history of osteoarthritis, degenerative disc disease fibromyalgia and insomnia. She states she has been under a lot of stress. She recently lost her brother from Covid-19  infection. She states she has been experiencing increased fatigue due to depression. She also has been suffering from insomnia. She has been taking temazepam for insomnia.  Activities of Daily Living:  Patient reports morning stiffness for 2 hours.   Patient Reports nocturnal pain.  Difficulty dressing/grooming: Reports Difficulty climbing stairs: Denies Difficulty getting out of chair: Reports Difficulty using hands for taps, buttons, cutlery, and/or writing: Reports  Review of Systems  Constitutional: Negative for fatigue.  HENT: Positive for mouth dryness. Negative for mouth sores and nose dryness.   Eyes: Negative for pain, itching and dryness.  Respiratory: Negative for shortness of breath and difficulty breathing.   Cardiovascular: Negative for chest pain and palpitations.  Gastrointestinal: Negative for blood in stool, constipation and diarrhea.  Endocrine: Negative for increased urination.  Genitourinary: Negative for difficulty urinating.  Musculoskeletal: Positive for arthralgias, joint pain, myalgias, morning stiffness, muscle tenderness and myalgias. Negative for joint swelling.  Skin: Negative for color change, rash and redness.  Allergic/Immunologic: Negative for susceptible to infections.  Neurological: Negative for dizziness, numbness, headaches, memory loss and weakness.  Hematological: Negative for bruising/bleeding tendency.  Psychiatric/Behavioral: Negative  for confusion.    PMFS History:  Patient Active Problem List   Diagnosis Date Noted  . Affective psychosis ([place]) 11/21/2020  . Allergic rhinitis due to pollen 11/21/2020  . Anxiety 11/21/2020  . Chronic kidney disease, stage 2 (mild) 11/21/2020  . Diabetic renal disease ([place]) 11/21/2020  . Diverticular disease of colon 11/21/2020  . Eczema 11/21/2020  . Gastroesophageal reflux disease 11/21/2020  . Gout 11/21/2020  . Joint pain 11/21/2020  . Lymphedema 11/21/2020  . Mild intermittent asthma 11/21/2020  . Moderate major depression, single episode ([place]) 11/21/2020  . Neuropathy 11/21/2020  . Type 2 diabetes mellitus with other specified complication ([place]) 63/14/9702  . Pure hypercholesterolemia 11/21/2020  . Vitamin D deficiency 11/21/2020  . Fibromyalgia 09/04/2017  . Insomnia 09/04/2017  . DDD (degenerative disc disease), cervical 09/04/2017  . DDD (degenerative disc disease), lumbar 09/04/2017  . Primary osteoarthritis of both hands 09/04/2017  . Primary osteoarthritis of both knees 09/04/2017  . History of rotator cuff tear 09/04/2017  . Osteopenia of multiple sites 09/04/2017  . History of diabetes mellitus 09/04/2017  . History of hypertension 09/04/2017  . History of hypercholesterolemia 09/04/2017    Past Medical History:  Diagnosis Date  . Anxiety   . Arthritis   . Asthma   . Depression   . Diabetes mellitus without complication ([place])   . Fibromyalgia   . GERD (gastroesophageal reflux disease)   . Hyperlipemia   . Hypertension   . PONV (postoperative nausea and vomiting)     Family History  Problem Relation Age of Onset  . Diabetes Mother   . Heart attack Father   . Diabetes Sister   . Throat cancer Brother   . Hypertension  Daughter    Past Surgical History:  Procedure Laterality Date  . COLONOSCOPY    . DILATION AND CURETTAGE OF UTERUS    . KNEE ARTHROSCOPY  [id number]   left  . SHOULDER ARTHROSCOPY     left  . TRIGGER FINGER RELEASE  08/13/2012    Procedure: RELEASE TRIGGER FINGER/A-1 PULLEY;  Surgeon: [name]., MD;  Location: [facility];  Service: Orthopedics;  Laterality: Right;  long finger  . TUBAL LIGATION     Social History   Social History Narrative  . Not on file   Immunization History  Administered Date(s) Administered  . Tdap 02/26/2012     Objective: Vital Signs: BP 135/85 (BP Location: Right Arm, Patient Position: Sitting, Cuff Size: Normal)   Pulse 82   Resp 14   Ht 5' 5.5" (1.664 m)   Wt 164 lb 12.8 oz (74.8 kg)   BMI 27.01 kg/m    Physical Exam Vitals and nursing note reviewed.  Constitutional:      Appearance: She is well-developed and well-nourished.  HENT:     Head: Normocephalic and atraumatic.  Eyes:     Extraocular Movements: EOM normal.     Conjunctiva/sclera: Conjunctivae normal.  Cardiovascular:     Rate and Rhythm: Normal rate and regular rhythm.     Pulses: Intact distal pulses.     Heart sounds: Normal heart sounds.  Pulmonary:     Effort: Pulmonary effort is normal.     Breath sounds: Normal breath sounds.  Abdominal:     General: Bowel sounds are normal.     Palpations: Abdomen is soft.  Musculoskeletal:     Cervical back: Normal range of motion.  Lymphadenopathy:     Cervical: No cervical adenopathy.  Skin:    General: Skin is warm and dry.     Capillary Refill: Capillary refill takes less than 2 seconds.  Neurological:     Mental Status: She is alert and oriented to person, place, and time.  Psychiatric:        Mood and Affect: Mood and affect normal.        Behavior: Behavior normal.      Musculoskeletal Exam: C-spine and lumbar spine were in limited range of motion. Shoulder joints, elbow joints, wrist joints with good range of motion. She has bilateral PIP and DIP thickening with no synovitis. She had good range of motion of bilateral hip joints and knee joints. There was no tenderness over ankles or MTPs.. CDAI Exam: CDAI Score: - Patient  Global: -; Provider Global: - Swollen: -; Tender: - Joint Exam 12/14/2020   No joint exam has been documented for this visit   There is currently no information documented on the homunculus. Go to the Rheumatology activity and complete the homunculus joint exam.  Investigation: No additional findings.  Imaging: No results found.  Recent Labs: Lab Results  Component Value Date   WBC 8.8 10/23/2020   HGB 12.8 10/23/2020   PLT 337 10/23/2020   NA 142 10/23/2020   K 4.0 10/23/2020   CL 107 10/23/2020   CO2 28 10/23/2020   GLUCOSE 174 (H) 10/23/2020   BUN 12 10/23/2020   CREATININE 1.17 (H) 10/23/2020   BILITOT 0.3 03/06/2017   ALKPHOS 60 03/06/2017   AST 16 03/06/2017   ALT 11 03/06/2017   PROT 6.5 03/06/2017   ALBUMIN 3.8 03/06/2017   CALCIUM 9.2 10/23/2020   GFRAA 51 (L) 04/11/2019    Speciality Comments: No specialty  comments available.  Procedures:  No procedures performed Allergies: Aspirin, Benadryl [diphenhydramine], Darvon [propoxyphene], Hydrocodone-homatropine, Metformin, Other, Pentazocine, Percocet [oxycodone-acetaminophen], Sulfa antibiotics, Tetracaine hcl, Tetracyclines & related, and Tramadol   Assessment / Plan:     Visit Diagnoses: Primary osteoarthritis of both hands-she had no synovitis on examination. Joint protection muscle strengthening was discussed.  Primary osteoarthritis of both knees-she complains of discomfort in the bilateral knee joints. No warmth swelling effusion was noted.  History of rotator cuff tear - left s/p repair in the past by Dr. Ronnie Derby.  Trapezius muscle spasm - baclofen 10 mg twice daily as needed for muscle spasms.  DDD (degenerative disc disease), cervical-she continues to have some discomfort in her cervical spine. She had limited range of motion.  DDD (degenerative disc disease), lumbar-she has limited range of motion and discomfort in her lumbar region.  Primary insomnia -patient states she is unable to sleep without  Restoril. Side effects were discussed. She was advised not to drive or use any machinery after taking Restoril. Increased risk of falling was also discussed. Per patient's request prescription refill was given. Restoril to 15 mg 1 tablet by mouth at bedtime as needed.   Fibromyalgia-according to patient fibromyalgia symptoms are flaring due to increased stress.  Osteopenia of multiple sites-she is on calcium and vitamin D.  History of hypercholesterolemia  History of diabetes mellitus  History of hypertension-blood pressure was normal today.  Orders: No orders of the defined types were placed in this encounter.  Meds ordered this encounter  Medications  . temazepam (RESTORIL) 15 MG capsule    Sig: Take 1 capsule (15 mg total) by mouth at bedtime as needed.    Dispense:  30 capsule    Refill:  0      Follow-Up Instructions: Return in about 6 months (around 06/13/2021) for Osteoarthritis, FMS.   Bo Merino, MD  Note - This record has been created using Editor, commissioning.  Chart creation errors have been sought, but may not always  have been located. Such creation errors do not reflect on  the standard of medical care.

## 2020-12-01 ENCOUNTER — Other Ambulatory Visit: Payer: Self-pay | Admitting: Physician Assistant

## 2020-12-01 NOTE — Telephone Encounter (Signed)
Seth Bake called patient, patient will reduce to 1 daily prn.

## 2020-12-01 NOTE — Telephone Encounter (Signed)
Last Visit: 05/30/2020 Next Visit: 12/14/2020  Current Dose per office note on 05/30/2020, baclofen 10 mg BID PRN for muscle spasms.  Dx:  Fibromyalgia  Okay to refill Baclofen?

## 2020-12-01 NOTE — Telephone Encounter (Signed)
Please advise the patient to try to reduce the dose of baclofen to 1 to 2 tablets daily as needed.   Ok to refill 30 day supply.

## 2020-12-14 ENCOUNTER — Other Ambulatory Visit: Payer: Self-pay

## 2020-12-14 ENCOUNTER — Encounter: Payer: Self-pay | Admitting: Rheumatology

## 2020-12-14 ENCOUNTER — Ambulatory Visit: Payer: Medicare PPO | Admitting: Rheumatology

## 2020-12-14 VITALS — BP 135/85 | HR 82 | Resp 14 | Ht 65.5 in | Wt 164.8 lb

## 2020-12-14 DIAGNOSIS — M170 Bilateral primary osteoarthritis of knee: Secondary | ICD-10-CM

## 2020-12-14 DIAGNOSIS — M51369 Other intervertebral disc degeneration, lumbar region without mention of lumbar back pain or lower extremity pain: Secondary | ICD-10-CM

## 2020-12-14 DIAGNOSIS — M5030 Other cervical disc degeneration, unspecified cervical region: Secondary | ICD-10-CM

## 2020-12-14 DIAGNOSIS — M5136 Other intervertebral disc degeneration, lumbar region: Secondary | ICD-10-CM

## 2020-12-14 DIAGNOSIS — M19041 Primary osteoarthritis, right hand: Secondary | ICD-10-CM | POA: Diagnosis not present

## 2020-12-14 DIAGNOSIS — Z8639 Personal history of other endocrine, nutritional and metabolic disease: Secondary | ICD-10-CM

## 2020-12-14 DIAGNOSIS — M19042 Primary osteoarthritis, left hand: Secondary | ICD-10-CM

## 2020-12-14 DIAGNOSIS — G5703 Lesion of sciatic nerve, bilateral lower limbs: Secondary | ICD-10-CM

## 2020-12-14 DIAGNOSIS — M797 Fibromyalgia: Secondary | ICD-10-CM | POA: Diagnosis not present

## 2020-12-14 DIAGNOSIS — Z8739 Personal history of other diseases of the musculoskeletal system and connective tissue: Secondary | ICD-10-CM

## 2020-12-14 DIAGNOSIS — M8589 Other specified disorders of bone density and structure, multiple sites: Secondary | ICD-10-CM | POA: Diagnosis not present

## 2020-12-14 DIAGNOSIS — F5101 Primary insomnia: Secondary | ICD-10-CM | POA: Diagnosis not present

## 2020-12-14 DIAGNOSIS — Z8679 Personal history of other diseases of the circulatory system: Secondary | ICD-10-CM

## 2020-12-14 DIAGNOSIS — M62838 Other muscle spasm: Secondary | ICD-10-CM

## 2020-12-14 MED ORDER — TEMAZEPAM 15 MG PO CAPS: 15.0000 mg | ORAL_CAPSULE | Freq: Every evening | ORAL | 0 refills | Status: DC | PRN

## 2020-12-19 DIAGNOSIS — M79672 Pain in left foot: Secondary | ICD-10-CM | POA: Diagnosis not present

## 2020-12-19 DIAGNOSIS — M791 Myalgia, unspecified site: Secondary | ICD-10-CM | POA: Diagnosis not present

## 2021-01-02 ENCOUNTER — Other Ambulatory Visit: Payer: Self-pay | Admitting: Physician Assistant

## 2021-01-02 NOTE — Telephone Encounter (Signed)
Last Visit: 12/14/2020,  Next Visit: message sent to front desk to schedule appt,  Return in about 6 months (around 06/13/2021) for Osteoarthritis, FMS.  Current Dose per office note on 12/14/2020, baclofen 10 mg twice daily as needed for muscle spasms. Dx: Trapezius muscle spasm  Last Fill: 12/01/2020  Okay to refill Baclofen?

## 2021-01-02 NOTE — Telephone Encounter (Signed)
I LMOM for patient to call to schedule a follow up appointment in August 2022.

## 2021-01-02 NOTE — Telephone Encounter (Signed)
Please call patient to schedule appt,  Return in about 6 months (around 06/13/2021) for Osteoarthritis, FMS.

## 2021-01-08 ENCOUNTER — Ambulatory Visit: Payer: Medicare PPO | Admitting: Podiatry

## 2021-01-08 ENCOUNTER — Ambulatory Visit (INDEPENDENT_AMBULATORY_CARE_PROVIDER_SITE_OTHER): Payer: Medicare PPO | Admitting: Podiatry

## 2021-01-08 ENCOUNTER — Other Ambulatory Visit: Payer: Self-pay

## 2021-01-08 DIAGNOSIS — Q828 Other specified congenital malformations of skin: Secondary | ICD-10-CM | POA: Diagnosis not present

## 2021-01-08 DIAGNOSIS — M79674 Pain in right toe(s): Secondary | ICD-10-CM | POA: Diagnosis not present

## 2021-01-08 DIAGNOSIS — E1142 Type 2 diabetes mellitus with diabetic polyneuropathy: Secondary | ICD-10-CM

## 2021-01-08 DIAGNOSIS — M779 Enthesopathy, unspecified: Secondary | ICD-10-CM

## 2021-01-08 DIAGNOSIS — M792 Neuralgia and neuritis, unspecified: Secondary | ICD-10-CM

## 2021-01-08 DIAGNOSIS — B351 Tinea unguium: Secondary | ICD-10-CM

## 2021-01-08 DIAGNOSIS — M79675 Pain in left toe(s): Secondary | ICD-10-CM | POA: Diagnosis not present

## 2021-01-08 NOTE — Progress Notes (Signed)
Patient presents today for a re-adjustment of the left insert due to the patient has one leg shorter than the other.  I made a cork heel lift and fitted to the bottom of the left insert and filed and grinded it to fit.  Patient stated that felt a lot better and I stated to the patient that this was a new pad and will eventually will wear down and it could be more comfortable then and patient understood.

## 2021-01-12 DIAGNOSIS — E1169 Type 2 diabetes mellitus with other specified complication: Secondary | ICD-10-CM | POA: Diagnosis not present

## 2021-01-14 ENCOUNTER — Other Ambulatory Visit: Payer: Self-pay | Admitting: Rheumatology

## 2021-01-14 NOTE — Progress Notes (Signed)
Subjective: 70 year old female presents the office today for concerns of thick, discolored toenails that she cannot trim her self as well as a callus on her right foot.  There is no other area discomfort.  There is no drainage or open sores that she reports.  She has no other concerns today.  Objective: AAO x3, NAD DP/PT pulses palpable bilaterally, CRT less than 3 seconds Hyperkeratotic lesion right foot submetatarsal without any underlying ulceration drainage or signs of infection.   Nails are hypertrophic, dystrophic, brittle, discolored, elongated 10. No surrounding redness or drainage. Tenderness nails 1-5 bilaterally. No open lesions. No significant pain to the ankle today. MMT 5/5 No pain with calf compression, swelling, warmth, erythema  Assessment: Hyperkeratotic lesion, symptomatic onychomycosis  Plan: -All treatment options discussed with the patient including all alternatives, risks, complications.  -Nails sharply debrided x10 without any complications including -Hyperkeratotic lesion sharply debrided x1 without any complications or bleeding -Patient encouraged to call the office with any questions, concerns, change in symptoms.   Trula Slade DPM

## 2021-01-15 NOTE — Telephone Encounter (Signed)
I LMOM to schedule her follow up appointment in August 2022.

## 2021-01-15 NOTE — Telephone Encounter (Signed)
Next Visit: message sent to front desk to schedule appt, Return in about 6 months (around 06/13/2021) for Osteoarthritis, FMS.    Last Visit: 12/14/2020  Last Fill: 12/14/2020,   Dx: Primary insomnia  Current Dose per office note on 12/14/2020, Restoril to 15 mg 1 tablet by mouth at bedtime as needed  Okay to refill Restoril?

## 2021-01-15 NOTE — Telephone Encounter (Signed)
Please call patient to schedule appt. Thank you.  Return in about 6 months (around 06/13/2021) for Osteoarthritis, FMS.

## 2021-01-16 DIAGNOSIS — M79672 Pain in left foot: Secondary | ICD-10-CM | POA: Diagnosis not present

## 2021-01-16 DIAGNOSIS — M791 Myalgia, unspecified site: Secondary | ICD-10-CM | POA: Diagnosis not present

## 2021-01-30 ENCOUNTER — Other Ambulatory Visit: Payer: Self-pay | Admitting: Physician Assistant

## 2021-01-30 NOTE — Telephone Encounter (Signed)
Next Visit: 06/12/2021  Last Visit: 12/14/2020  Last Fill: 01/02/2021  Dx: Trapezius muscle spasm  Current Dose per office note on 12/14/2020, baclofen 10 mg twice daily as needed for muscle spasms.  Okay to refill Baclofen?

## 2021-02-16 ENCOUNTER — Other Ambulatory Visit: Payer: Self-pay | Admitting: Physician Assistant

## 2021-02-16 DIAGNOSIS — M79672 Pain in left foot: Secondary | ICD-10-CM | POA: Diagnosis not present

## 2021-02-16 DIAGNOSIS — M791 Myalgia, unspecified site: Secondary | ICD-10-CM | POA: Diagnosis not present

## 2021-02-18 NOTE — Telephone Encounter (Signed)
Next Visit: 06/12/2021  Last Visit: 12/14/2020  Last Fill: 01/15/2021  Dx: Primary insomnia  Current Dose per office note on 12/14/2020,  Restoril to 15 mg 1 tablet by mouth at bedtime as needed  Okay to refill Restoril?

## 2021-03-01 ENCOUNTER — Other Ambulatory Visit: Payer: Self-pay | Admitting: Physician Assistant

## 2021-03-01 NOTE — Telephone Encounter (Signed)
Next Visit: 06/12/2021  Last Visit: 12/14/2020  Last Fill: 01/30/2021  Dx: Trapezius muscle spasm  Current Dose per office note on 12/14/2020, baclofen 10 mg twice daily as needed for muscle spasms.  Okay to refill Baclofen?

## 2021-03-14 DIAGNOSIS — H2513 Age-related nuclear cataract, bilateral: Secondary | ICD-10-CM | POA: Diagnosis not present

## 2021-03-14 DIAGNOSIS — H524 Presbyopia: Secondary | ICD-10-CM | POA: Diagnosis not present

## 2021-03-14 DIAGNOSIS — H43813 Vitreous degeneration, bilateral: Secondary | ICD-10-CM | POA: Diagnosis not present

## 2021-03-14 DIAGNOSIS — E119 Type 2 diabetes mellitus without complications: Secondary | ICD-10-CM | POA: Diagnosis not present

## 2021-03-18 DIAGNOSIS — M791 Myalgia, unspecified site: Secondary | ICD-10-CM | POA: Diagnosis not present

## 2021-03-18 DIAGNOSIS — M79672 Pain in left foot: Secondary | ICD-10-CM | POA: Diagnosis not present

## 2021-03-29 ENCOUNTER — Other Ambulatory Visit: Payer: Self-pay | Admitting: Physician Assistant

## 2021-03-29 DIAGNOSIS — Z20822 Contact with and (suspected) exposure to covid-19: Secondary | ICD-10-CM | POA: Diagnosis not present

## 2021-03-30 ENCOUNTER — Other Ambulatory Visit: Payer: Self-pay | Admitting: Physician Assistant

## 2021-03-30 MED ORDER — TEMAZEPAM 15 MG PO CAPS: ORAL_CAPSULE | ORAL | 0 refills | Status: DC

## 2021-03-30 NOTE — Progress Notes (Signed)
I attempted to contact patient and left message on machine to advise patient that prescription has been sent to the pharmacy this morning.

## 2021-04-02 ENCOUNTER — Telehealth: Payer: Self-pay | Admitting: *Deleted

## 2021-04-02 NOTE — Telephone Encounter (Signed)
Patient is calling with concerns of her left ankle,feels like a small reptile crawling around her foot/leg yesterday. Please schedule for a sooner upcoming appointment(04/15/21).Please advise.

## 2021-04-03 NOTE — Telephone Encounter (Signed)
It sounds like neuropathy. Can you see if she is still taking the gabapentin and if so can you ask her current dose?

## 2021-04-03 NOTE — Telephone Encounter (Signed)
Called and spoke with the patient and relayed the message per Dr Jacqualyn Posey. [name]

## 2021-04-10 ENCOUNTER — Encounter: Payer: Self-pay | Admitting: Podiatry

## 2021-04-10 ENCOUNTER — Ambulatory Visit: Payer: Medicare PPO | Admitting: Podiatry

## 2021-04-10 ENCOUNTER — Other Ambulatory Visit: Payer: Self-pay

## 2021-04-10 DIAGNOSIS — I872 Venous insufficiency (chronic) (peripheral): Secondary | ICD-10-CM | POA: Diagnosis not present

## 2021-04-10 DIAGNOSIS — F4321 Adjustment disorder with depressed mood: Secondary | ICD-10-CM | POA: Insufficient documentation

## 2021-04-10 DIAGNOSIS — E1142 Type 2 diabetes mellitus with diabetic polyneuropathy: Secondary | ICD-10-CM

## 2021-04-10 DIAGNOSIS — B351 Tinea unguium: Secondary | ICD-10-CM

## 2021-04-10 DIAGNOSIS — Q828 Other specified congenital malformations of skin: Secondary | ICD-10-CM

## 2021-04-10 DIAGNOSIS — M79674 Pain in right toe(s): Secondary | ICD-10-CM

## 2021-04-10 DIAGNOSIS — M79675 Pain in left toe(s): Secondary | ICD-10-CM

## 2021-04-10 DIAGNOSIS — Z8619 Personal history of other infectious and parasitic diseases: Secondary | ICD-10-CM | POA: Insufficient documentation

## 2021-04-11 ENCOUNTER — Telehealth: Payer: Self-pay | Admitting: Podiatry

## 2021-04-11 ENCOUNTER — Ambulatory Visit (HOSPITAL_COMMUNITY)
Admission: RE | Admit: 2021-04-11 | Discharge: 2021-04-11 | Disposition: A | Discharge status: Home / Self Care | Payer: Medicare PPO | Source: Ambulatory Visit | Attending: Podiatry | Admitting: Podiatry

## 2021-04-11 DIAGNOSIS — I872 Venous insufficiency (chronic) (peripheral): Secondary | ICD-10-CM | POA: Insufficient documentation
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
## 2021-04-12 ENCOUNTER — Telehealth: Payer: Self-pay | Admitting: *Deleted

## 2021-04-12 ENCOUNTER — Other Ambulatory Visit: Payer: Self-pay | Admitting: Podiatry

## 2021-04-12 DIAGNOSIS — I872 Venous insufficiency (chronic) (peripheral): Secondary | ICD-10-CM

## 2021-04-12 NOTE — Telephone Encounter (Signed)
-----   Message from Trula Slade, DPM sent at 04/12/2021  8:00 AM EDT ----- [name]- I put in a referral for [name] for her. Can you please fax it over? Thanks!

## 2021-04-12 NOTE — Telephone Encounter (Signed)
Faxed the referral to [facility] today and the fax number is [phone]. [name]

## 2021-04-12 NOTE — Progress Notes (Signed)
Subjective: 70 year old female presents the office today for concerns of thick, discolored toenails that she cannot trim her self as well as a callus on her right foot.  There is no other area discomfort.  Also she has noticed some chronic swelling to her leg.  Denies any open sores or drainage.  There is no drainage or open sores that she reports.  She has no other concerns today.  Objective: AAO x3, NAD DP/PT pulses palpable bilaterally, CRT less than 3 seconds Edema present bilateral lower extremities. Hyperkeratotic lesion right foot submetatarsal without any underlying ulceration drainage or signs of infection.   Nails are hypertrophic, dystrophic, brittle, discolored, elongated 10. No surrounding redness or drainage. Tenderness nails 1-5 bilaterally. No open lesions. No significant pain to the ankle today. MMT 5/5 No pain with calf compression, warmth, erythema  Assessment: Hyperkeratotic lesion, symptomatic onychomycosis; edema  Plan: -All treatment options discussed with the patient including all alternatives, risks, complications.  -Nails sharply debrided x10 without any complications including -Hyperkeratotic lesion sharply debrided x1 without any complications or bleeding -Order venous reflux study due to the edema.  Discussion for DVT is low. -Patient encouraged to call the office with any questions, concerns, change in symptoms.   Trula Slade DPM

## 2021-04-16 ENCOUNTER — Other Ambulatory Visit: Payer: Self-pay | Admitting: Podiatry

## 2021-04-16 ENCOUNTER — Telehealth: Payer: Self-pay | Admitting: Podiatry

## 2021-04-16 DIAGNOSIS — I872 Venous insufficiency (chronic) (peripheral): Secondary | ICD-10-CM

## 2021-04-16 NOTE — Telephone Encounter (Signed)
Patient calling to request she be referred to another vein clinic closer to her home. She stated that she does not drive and that the facility on [address] is too far. Please advise.

## 2021-04-18 DIAGNOSIS — M79672 Pain in left foot: Secondary | ICD-10-CM | POA: Diagnosis not present

## 2021-04-18 DIAGNOSIS — M791 Myalgia, unspecified site: Secondary | ICD-10-CM | POA: Diagnosis not present

## 2021-04-19 ENCOUNTER — Telehealth: Payer: Self-pay | Admitting: *Deleted

## 2021-04-19 NOTE — Telephone Encounter (Signed)
[facility] for patient's scheduled appointment, said that the patient was scheduled for 04/24/21 but cancelled.

## 2021-04-20 NOTE — Telephone Encounter (Signed)
Kiara w/ [facility] calling for clarification on an [name] status for patient.Please contact:[phone].  Returned call to office ,closed , will try again next business day.

## 2021-04-21 ENCOUNTER — Encounter (HOSPITAL_COMMUNITY): Payer: Self-pay | Admitting: Urgent Care

## 2021-04-21 ENCOUNTER — Ambulatory Visit (HOSPITAL_COMMUNITY)
Admission: EM | Admit: 2021-04-21 | Discharge: 2021-04-21 | Disposition: A | Discharge status: Home / Self Care | Payer: Medicare PPO | Attending: Urgent Care | Admitting: Urgent Care

## 2021-04-21 DIAGNOSIS — IMO0002 Reserved for concepts with insufficient information to code with codable children: Secondary | ICD-10-CM

## 2021-04-21 DIAGNOSIS — I890 Lymphedema, not elsewhere classified: Secondary | ICD-10-CM

## 2021-04-21 DIAGNOSIS — M25572 Pain in left ankle and joints of left foot: Secondary | ICD-10-CM | POA: Diagnosis not present

## 2021-04-21 DIAGNOSIS — I872 Venous insufficiency (chronic) (peripheral): Secondary | ICD-10-CM

## 2021-04-21 DIAGNOSIS — M25472 Effusion, left ankle: Secondary | ICD-10-CM

## 2021-04-21 DIAGNOSIS — N182 Chronic kidney disease, stage 2 (mild): Secondary | ICD-10-CM | POA: Diagnosis not present

## 2021-04-21 DIAGNOSIS — E1122 Type 2 diabetes mellitus with diabetic chronic kidney disease: Secondary | ICD-10-CM | POA: Diagnosis not present

## 2021-04-21 MED ORDER — FUROSEMIDE 20 MG PO TABS: 20.0000 mg | ORAL_TABLET | Freq: Every day | ORAL | 0 refills | Status: DC
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
## 2021-04-23 DIAGNOSIS — M25572 Pain in left ankle and joints of left foot: Secondary | ICD-10-CM | POA: Diagnosis not present

## 2021-04-26 ENCOUNTER — Other Ambulatory Visit: Payer: Self-pay | Admitting: Orthopaedic Surgery

## 2021-04-26 DIAGNOSIS — M25572 Pain in left ankle and joints of left foot: Secondary | ICD-10-CM

## 2021-04-29 ENCOUNTER — Other Ambulatory Visit: Payer: Self-pay

## 2021-04-29 ENCOUNTER — Ambulatory Visit
Admission: RE | Admit: 2021-04-29 | Discharge: 2021-04-29 | Disposition: A | Discharge status: Home / Self Care | Payer: Medicare PPO | Source: Ambulatory Visit | Attending: Orthopaedic Surgery | Admitting: Orthopaedic Surgery

## 2021-04-29 DIAGNOSIS — R600 Localized edema: Secondary | ICD-10-CM | POA: Diagnosis not present

## 2021-04-29 DIAGNOSIS — M19072 Primary osteoarthritis, left ankle and foot: Secondary | ICD-10-CM | POA: Diagnosis not present

## 2021-04-29 DIAGNOSIS — M7989 Other specified soft tissue disorders: Secondary | ICD-10-CM | POA: Diagnosis not present

## 2021-04-29 DIAGNOSIS — M25572 Pain in left ankle and joints of left foot: Secondary | ICD-10-CM

## 2021-05-01 ENCOUNTER — Other Ambulatory Visit: Payer: Self-pay

## 2021-05-01 MED ORDER — TEMAZEPAM 15 MG PO CAPS: ORAL_CAPSULE | ORAL | 0 refills | Status: DC

## 2021-05-01 NOTE — Telephone Encounter (Signed)
Next Visit: 06/12/2021   Last Visit: 12/14/2020   Last Fill: 03/30/2021  Dx: Primary insomnia   Current Dose per office note on 12/14/2020,  Restoril to 15 mg 1 tablet by mouth at bedtime as needed   Okay to refill Restoril?

## 2021-05-01 NOTE — Telephone Encounter (Signed)
Please remind patient that she should not take temazepam with alprazolam.

## 2021-05-01 NOTE — Telephone Encounter (Signed)
Patient called requesting prescription refill of Temazepam to be sent to [facility] at [address]. °

## 2021-05-01 NOTE — Telephone Encounter (Signed)
Attempted to contact the patient and left message for patient to call the office.  

## 2021-05-03 ENCOUNTER — Other Ambulatory Visit: Payer: Medicare PPO

## 2021-05-04 DIAGNOSIS — M25572 Pain in left ankle and joints of left foot: Secondary | ICD-10-CM | POA: Diagnosis not present

## 2021-05-08 DIAGNOSIS — R609 Edema, unspecified: Secondary | ICD-10-CM | POA: Diagnosis not present

## 2021-05-22 ENCOUNTER — Encounter: Payer: Self-pay | Admitting: Physician Assistant

## 2021-05-22 ENCOUNTER — Ambulatory Visit: Payer: Medicare Other | Admitting: Physician Assistant

## 2021-05-22 ENCOUNTER — Other Ambulatory Visit: Payer: Self-pay

## 2021-05-22 VITALS — BP 123/75 | HR 81 | Temp 97.9°F | Resp 20 | Ht 65.5 in | Wt 166.1 lb

## 2021-05-22 DIAGNOSIS — I872 Venous insufficiency (chronic) (peripheral): Secondary | ICD-10-CM
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
## 2021-05-29 NOTE — Progress Notes (Signed)
Office Visit Note  Patient: [name]             Date of Birth: 07-Jun-1951           MRN: [id number]             PCP: [name], MD Referring: [name], MD Visit Date: 06/12/2021 Occupation: @GUAROCC @  Subjective:  Other (Bilateral hip pain )   History of Present Illness: [name] is a 70 y.o. female with history of osteoarthritis and fibromyalgia syndrome.  She states she is having pain in almost all of her joints.  She complains of discomfort in her hands especially her right ring finger.  She complains of discomfort in her bilateral hips and bilateral ankles.  She has neck and lower back pain. She started going to the water aerobics and its been helpful.  She uses a TENS unit.  She uses baclofen twice a day for pain relief.  She also has been taking Restoril 15 mg at bedtime for insomnia.  Activities of Daily Living:  Patient reports morning stiffness for all day. Patient Reports nocturnal pain.  Difficulty dressing/grooming: Reports Difficulty climbing stairs: Reports Difficulty getting out of chair: Reports Difficulty using hands for taps, buttons, cutlery, and/or writing: Reports  Review of Systems  Constitutional:  Positive for fatigue.  HENT:  Positive for mouth dryness. Negative for mouth sores and nose dryness.   Eyes:  Negative for pain, itching and dryness.  Respiratory:  Negative for shortness of breath and difficulty breathing.   Cardiovascular:  Negative for chest pain and palpitations.  Gastrointestinal:  Negative for blood in stool, constipation and diarrhea.  Endocrine: Negative for increased urination.  Genitourinary:  Negative for difficulty urinating.  Musculoskeletal:  Positive for joint pain, joint pain, joint swelling, myalgias, morning stiffness, muscle tenderness and myalgias.  Skin:  Negative for color change, rash, redness and sensitivity to sunlight.  Allergic/Immunologic: Negative for susceptible to infections.   Neurological:  Positive for weakness. Negative for dizziness, numbness, headaches and memory loss.  Hematological:  Negative for bruising/bleeding tendency and swollen glands.  Psychiatric/Behavioral:  Negative for confusion.    PMFS History:  Patient Active Problem List   Diagnosis Date Noted   Adjustment disorder with depressed mood 04/10/2021   History of infectious disease 04/10/2021   Affective psychosis ([place]) 11/21/2020   Allergic rhinitis due to pollen 11/21/2020   Anxiety 11/21/2020   Chronic kidney disease, stage 2 (mild) 11/21/2020   Diabetic renal disease ([place]) 11/21/2020   Diverticular disease of colon 11/21/2020   Eczema 11/21/2020   Gastroesophageal reflux disease 11/21/2020   Gout 11/21/2020   Joint pain 11/21/2020   Lymphedema 11/21/2020   Mild intermittent asthma 11/21/2020   Moderate major depression, single episode ([place]) 11/21/2020   Neuropathy 11/21/2020   Type 2 diabetes mellitus with other specified complication ([place]) 44/10/270   Pure hypercholesterolemia 11/21/2020   Vitamin D deficiency 11/21/2020   Fibromyalgia 09/04/2017   Insomnia 09/04/2017   DDD (degenerative disc disease), cervical 09/04/2017   DDD (degenerative disc disease), lumbar 09/04/2017   Primary osteoarthritis of both hands 09/04/2017   Primary osteoarthritis of both knees 09/04/2017   History of rotator cuff tear 09/04/2017   Osteopenia of multiple sites 09/04/2017   History of diabetes mellitus 09/04/2017   History of hypertension 09/04/2017   History of hypercholesterolemia 09/04/2017    Past Medical History:  Diagnosis Date   Anxiety    Arthritis    Asthma    Depression  Diabetes mellitus without complication (HCC)    Fibromyalgia    GERD (gastroesophageal reflux disease)    Hyperlipemia    Hypertension    PONV (postoperative nausea and vomiting)     Family History  Problem Relation Age of Onset   Diabetes Mother    Heart attack Father    Diabetes Sister    Throat  cancer Brother    Hypertension Daughter    Past Surgical History:  Procedure Laterality Date   COLONOSCOPY     DILATION AND CURETTAGE OF UTERUS     KNEE ARTHROSCOPY  2003,2005   left   SHOULDER ARTHROSCOPY     left   TRIGGER FINGER RELEASE  08/13/2012   Procedure: RELEASE TRIGGER FINGER/A-1 PULLEY;  Surgeon: [name]., MD;  Location: [place];  Service: Orthopedics;  Laterality: Right;  long finger   TUBAL LIGATION     Social History   Social History Narrative   Not on file   Immunization History  Administered Date(s) Administered   Tdap 02/26/2012     Objective: Vital Signs: BP 138/76 (BP Location: Right Arm, Patient Position: Sitting, Cuff Size: Normal)   Pulse 77   Ht 5\' 5"  (1.651 m)   Wt 170 lb (77.1 kg)   BMI 28.29 kg/m    Physical Exam Vitals and nursing note reviewed.  Constitutional:      Appearance: She is well-developed.  HENT:     Head: Normocephalic and atraumatic.  Eyes:     Conjunctiva/sclera: Conjunctivae normal.  Cardiovascular:     Rate and Rhythm: Normal rate and regular rhythm.     Heart sounds: Normal heart sounds.  Pulmonary:     Effort: Pulmonary effort is normal.     Breath sounds: Normal breath sounds.  Abdominal:     General: Bowel sounds are normal.     Palpations: Abdomen is soft.  Musculoskeletal:     Cervical back: Normal range of motion.  Lymphadenopathy:     Cervical: No cervical adenopathy.  Skin:    General: Skin is warm and dry.     Capillary Refill: Capillary refill takes less than 2 seconds.  Neurological:     Mental Status: She is alert and oriented to person, place, and time.  Psychiatric:        Behavior: Behavior normal.     Musculoskeletal Exam: She had limited range of motion of cervical and lumbar spine.  Shoulders and elbows in good range of motion.  She had bilateral PIP and DIP thickening and right trigger ring finger.  She had discomfort range of motion of her hip joints and knee  joints.  No warmth swelling or effusion was noted.  She had tenderness over ankles and MTPs without any synovitis.  CDAI Exam: CDAI Score: -- Patient Global: --; Provider Global: -- Swollen: --; Tender: -- Joint Exam 06/12/2021   No joint exam has been documented for this visit   There is currently no information documented on the homunculus. Go to the Rheumatology activity and complete the homunculus joint exam.  Investigation: No additional findings.  Imaging: No results found.  Recent Labs: Lab Results  Component Value Date   WBC 8.8 10/23/2020   HGB 12.8 10/23/2020   PLT 337 10/23/2020   NA 142 10/23/2020   K 4.0 10/23/2020   CL 107 10/23/2020   CO2 28 10/23/2020   GLUCOSE 174 (H) 10/23/2020   BUN 12 10/23/2020   CREATININE 1.17 (H) 10/23/2020   BILITOT  0.3 03/06/2017   ALKPHOS 60 03/06/2017   AST 16 03/06/2017   ALT 11 03/06/2017   PROT 6.5 03/06/2017   ALBUMIN 3.8 03/06/2017   CALCIUM 9.2 10/23/2020   GFRAA 51 (L) 04/11/2019    Speciality Comments: No specialty comments available.  Procedures:  No procedures performed Allergies: Aspirin, Benadryl [diphenhydramine], Darvon [propoxyphene], Hydrocodone bit-homatrop mbr, Metformin, Other, Pentazocine, Percocet [oxycodone-acetaminophen], Sulfa antibiotics, Tetracaine hcl, Tetracyclines & related, and Tramadol   Assessment / Plan:     Visit Diagnoses: Primary osteoarthritis of both hands-he continues to have pain and stiffness in her hands.  No synovitis was noted.  Trigger finger, right ring finger -she declined cortisone injection.  She states she had trigger finger release in the past on her right middle finger.  She would like to be referred to hand surgery.  Plan: Ambulatory referral to Hand Surgery  Primary osteoarthritis of both knees-she continues to have discomfort in her knee joints.  No warmth swelling or effusion was noted.  History of rotator cuff tear - left s/p repair in the past by Dr.  Ronnie Derby.  Trapezius muscle spasm - baclofen 10 mg twice daily as needed for muscle spasms.  DDD cervical-she had limited range of motion with discomfort.  DDD (degenerative disc disease), lumbar-she has chronic pain despite taking medications.  Primary insomnia - Restoril to 15 mg 1 tablet by mouth at bedtime as needed.  Insomnia is under control.  Fibromyalgia-she is having a flare of fibromyalgia with generalized pain and discomfort.  She has generalized hyperalgesia.  She has been doing water aerobics 3 times a week which is helpful.  Stretching exercises and regular exercise was emphasized.  Osteopenia of multiple sites-use of calcium rich diet, vitamin D and resistive exercises were discussed.  History of diabetes mellitus  History of hypercholesterolemia  History of hypertension-blood pressure was normal today.  Orders: Orders Placed This Encounter  Procedures   Ambulatory referral to Hand Surgery    No orders of the defined types were placed in this encounter.    Follow-Up Instructions: Return in about 6 months (around 12/13/2021) for Osteoarthritis,FMS.   Bo Merino, MD  Note - This record has been created using Editor, commissioning.  Chart creation errors have been sought, but may not always  have been located. Such creation errors do not reflect on  the standard of medical care.

## 2021-05-31 ENCOUNTER — Other Ambulatory Visit: Payer: Self-pay | Admitting: Rheumatology

## 2021-06-01 NOTE — Telephone Encounter (Signed)
Next Visit: 06/12/2021   Last Visit: 12/14/2020   Last Fill: 05/01/2021   Dx: Primary insomnia   Current Dose per office note on 12/14/2020,  Restoril to 15 mg 1 tablet by mouth at bedtime as needed   Okay to refill Restoril?

## 2021-06-03 IMAGING — CR BILATERAL RIBS AND CHEST - 4+ VIEW
7 of 8 series · 7 of 8 positions shown · non-contrast
Comparison: 01/28/2016

CLINICAL DATA: Rib pain

EXAM:
BILATERAL RIBS AND CHEST - 4+ VIEW

[w chest pa]
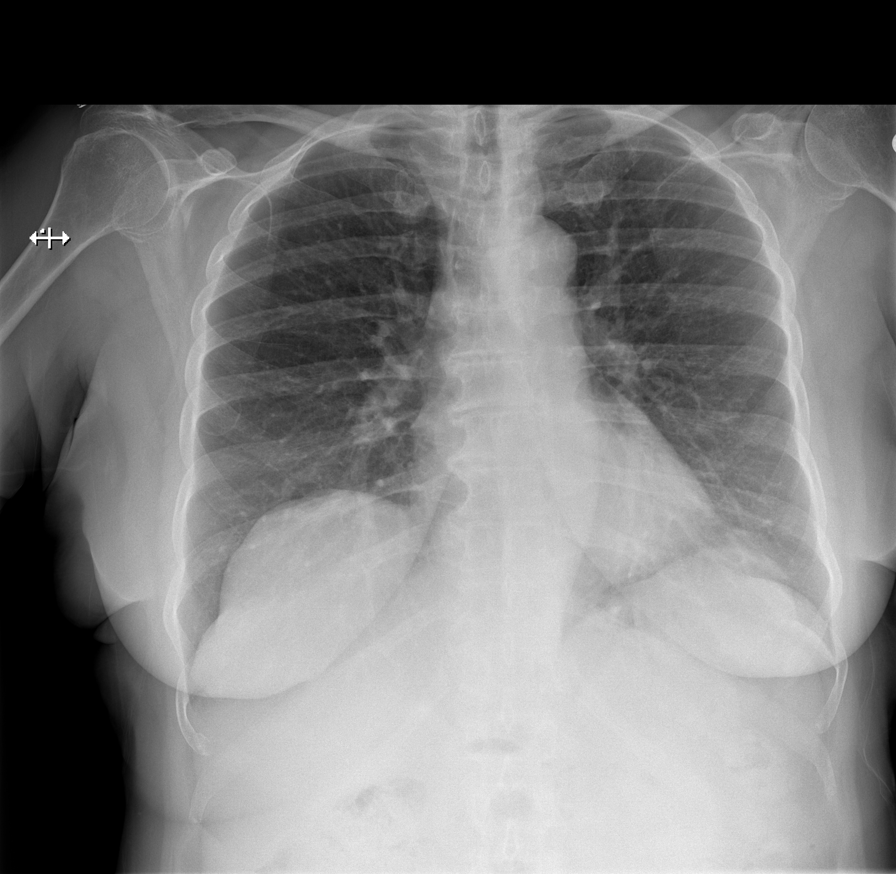

[w ribs pa upper left]
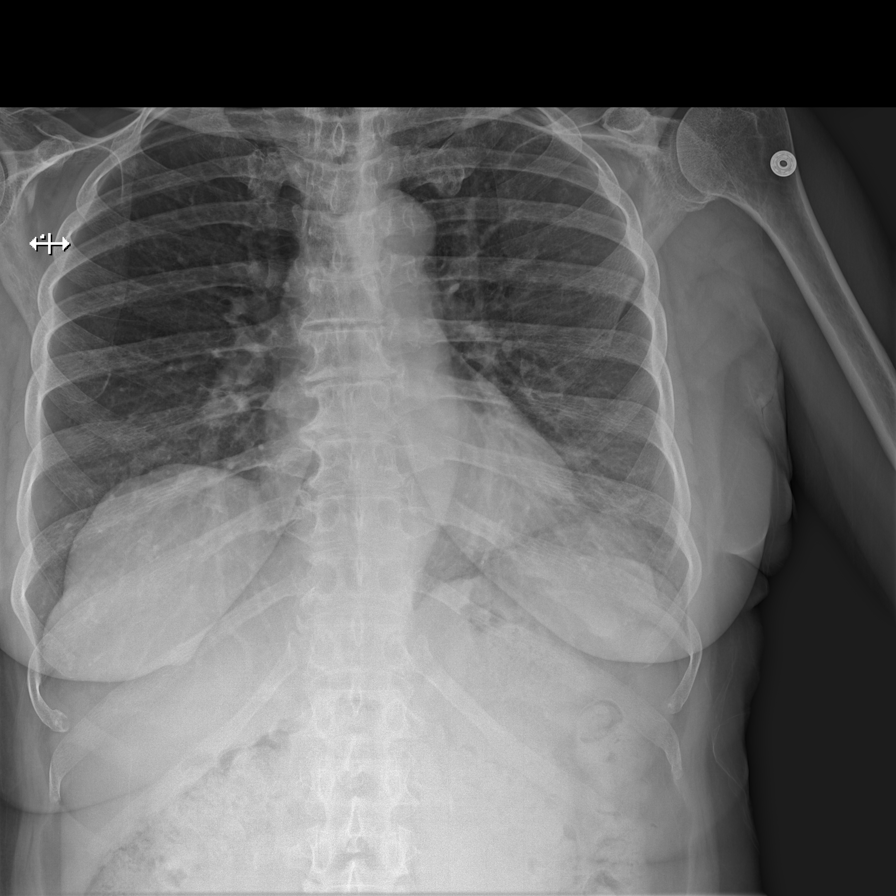

[w ribs pa lower left]
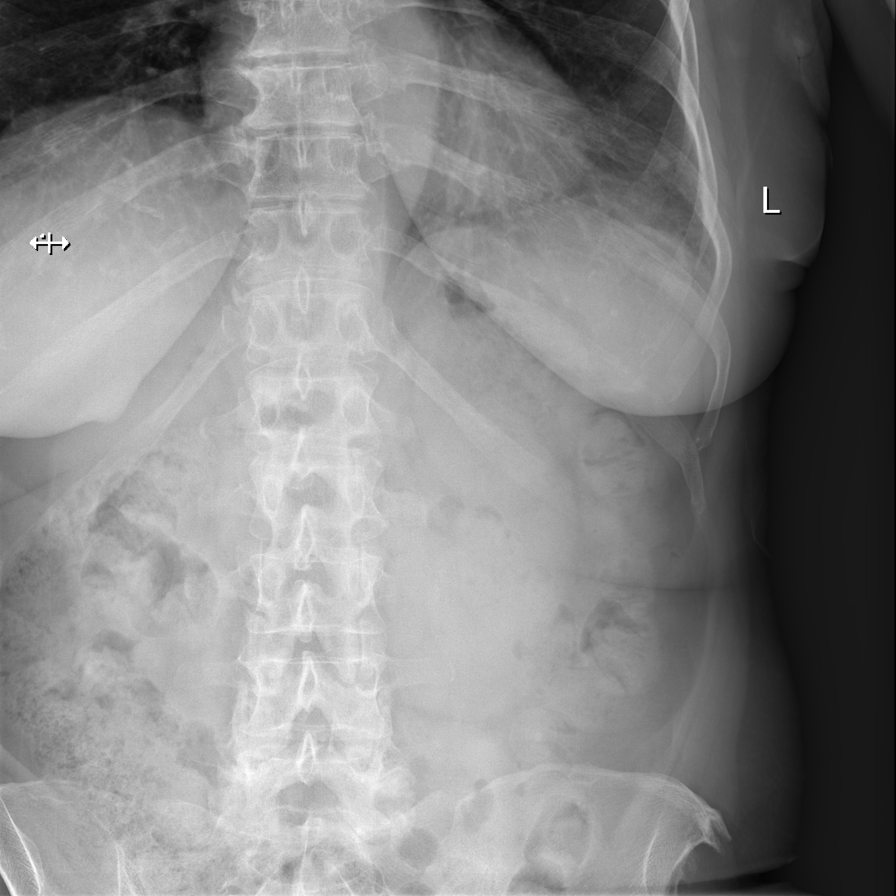

[w ribs pa lower right]
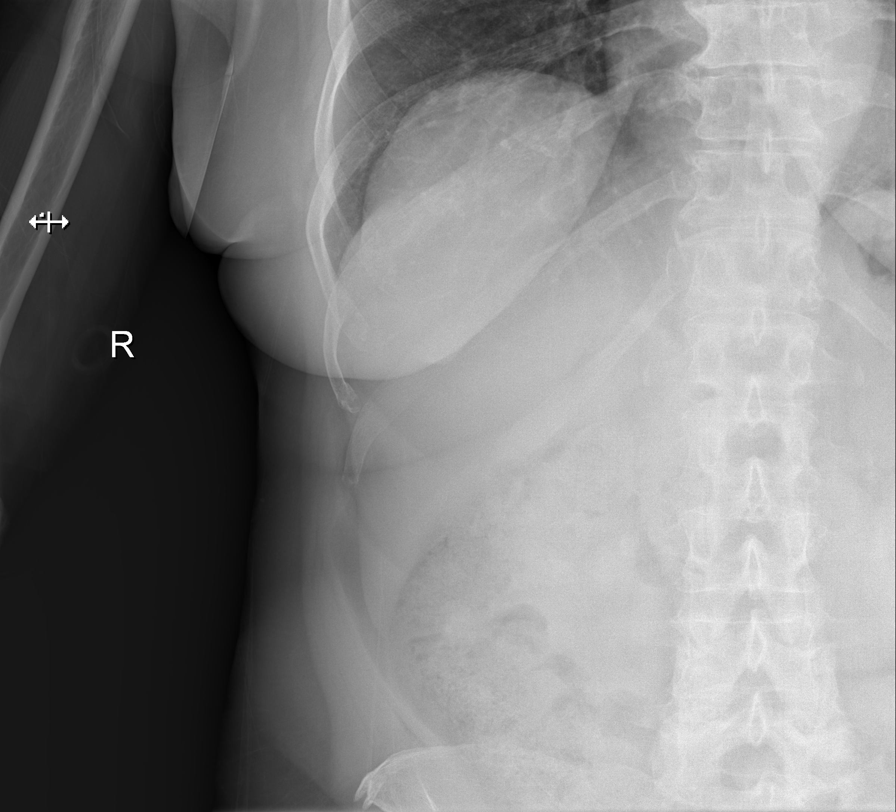

[w ribs pa upper right]
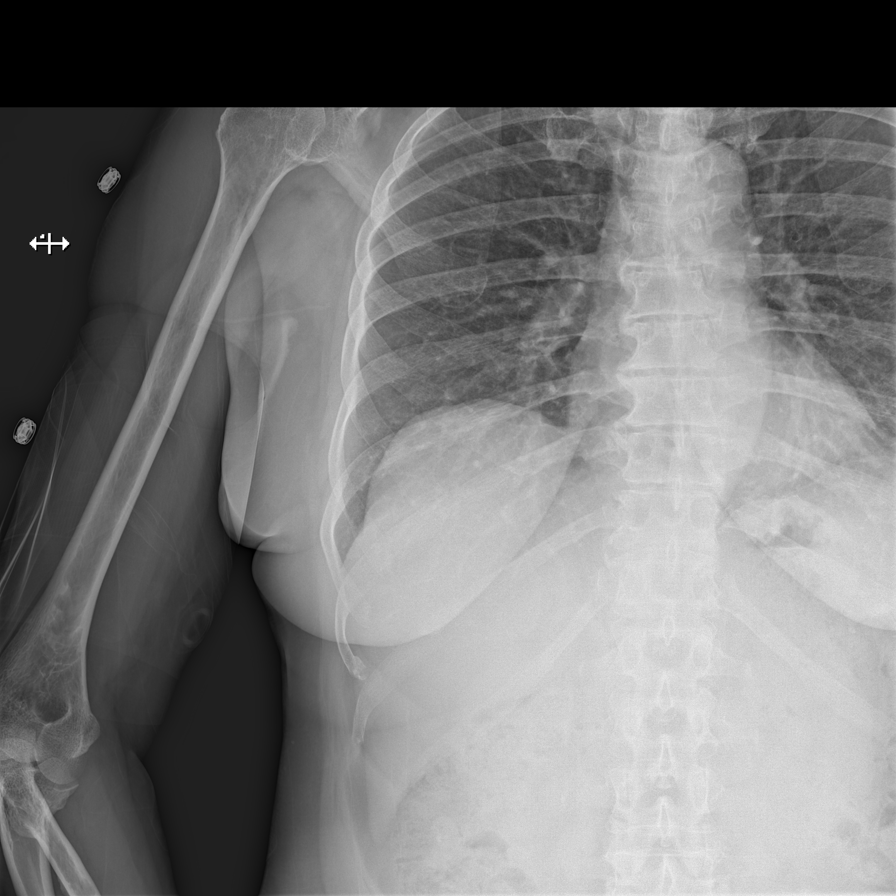

[w ribs obl right]
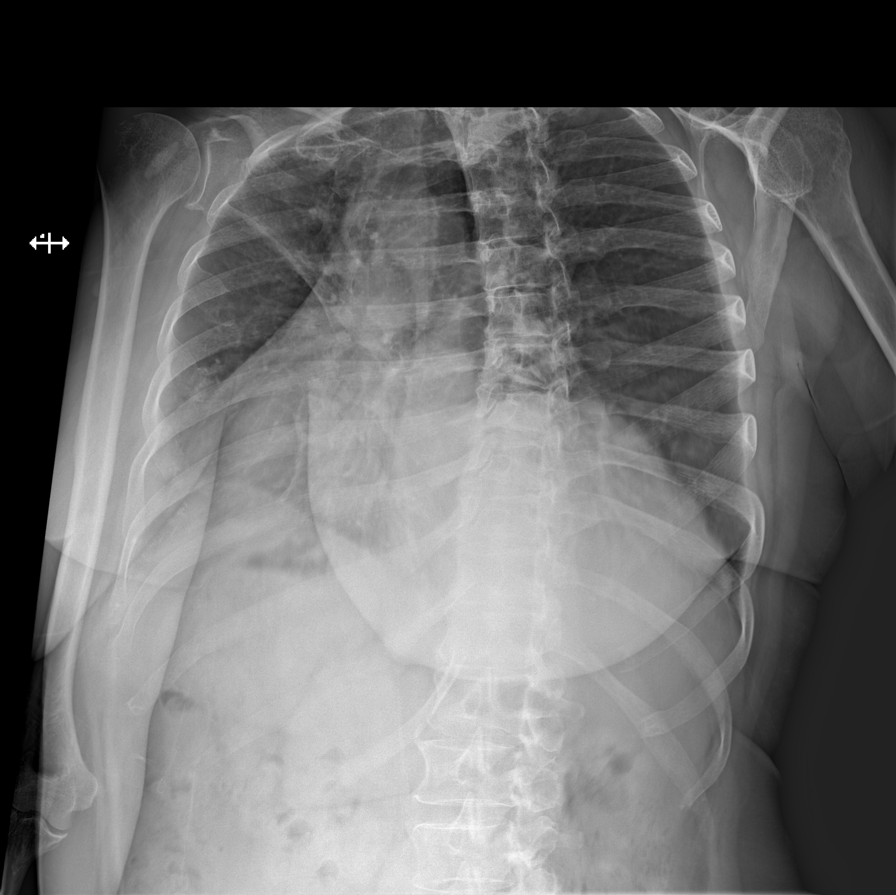

[w ribs obl left]
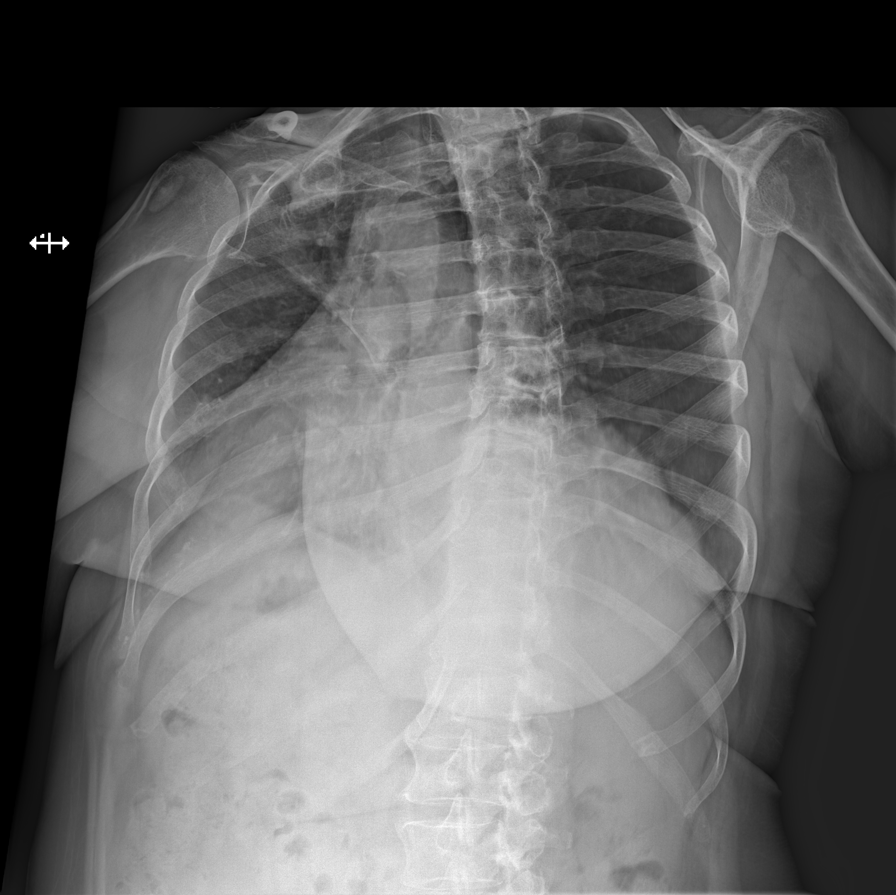

[7 of 8 positions shown; findings below may reference images not displayed]

FINDINGS: No fracture or other bone lesions are seen involving the ribs. There
is no evidence of pneumothorax or pleural effusion. Both lungs are
clear. Heart size and mediastinal contours are within normal limits.
IMPRESSION: No rib fracture or acute airspace disease.

## 2021-06-04 ENCOUNTER — Telehealth: Payer: Self-pay | Admitting: Rheumatology

## 2021-06-04 NOTE — Telephone Encounter (Signed)
Left message to advise patient her prescription was sent to the pharmacy on 06/01/2021. Advised patient to call the office if she has any trouble with her prescription.

## 2021-06-04 NOTE — Telephone Encounter (Signed)
Patient requesting refill on Temazepam sent to [facility] on [address].

## 2021-06-12 ENCOUNTER — Other Ambulatory Visit: Payer: Self-pay

## 2021-06-12 ENCOUNTER — Encounter: Payer: Self-pay | Admitting: Rheumatology

## 2021-06-12 ENCOUNTER — Ambulatory Visit (INDEPENDENT_AMBULATORY_CARE_PROVIDER_SITE_OTHER): Payer: Medicare Other | Admitting: Rheumatology

## 2021-06-12 VITALS — BP 138/76 | HR 77 | Ht 65.0 in | Wt 170.0 lb

## 2021-06-12 DIAGNOSIS — F5101 Primary insomnia: Secondary | ICD-10-CM | POA: Diagnosis not present

## 2021-06-12 DIAGNOSIS — M19041 Primary osteoarthritis, right hand: Secondary | ICD-10-CM | POA: Diagnosis not present

## 2021-06-12 DIAGNOSIS — M19042 Primary osteoarthritis, left hand: Secondary | ICD-10-CM

## 2021-06-12 DIAGNOSIS — M170 Bilateral primary osteoarthritis of knee: Secondary | ICD-10-CM

## 2021-06-12 DIAGNOSIS — Z8639 Personal history of other endocrine, nutritional and metabolic disease: Secondary | ICD-10-CM

## 2021-06-12 DIAGNOSIS — M62838 Other muscle spasm: Secondary | ICD-10-CM | POA: Diagnosis not present

## 2021-06-12 DIAGNOSIS — M5136 Other intervertebral disc degeneration, lumbar region: Secondary | ICD-10-CM

## 2021-06-12 DIAGNOSIS — M65341 Trigger finger, right ring finger: Secondary | ICD-10-CM

## 2021-06-12 DIAGNOSIS — Z8679 Personal history of other diseases of the circulatory system: Secondary | ICD-10-CM

## 2021-06-12 DIAGNOSIS — M797 Fibromyalgia: Secondary | ICD-10-CM | POA: Diagnosis not present

## 2021-06-12 DIAGNOSIS — M5030 Other cervical disc degeneration, unspecified cervical region: Secondary | ICD-10-CM | POA: Diagnosis not present

## 2021-06-12 DIAGNOSIS — Z8739 Personal history of other diseases of the musculoskeletal system and connective tissue: Secondary | ICD-10-CM | POA: Diagnosis not present

## 2021-06-12 DIAGNOSIS — M8589 Other specified disorders of bone density and structure, multiple sites: Secondary | ICD-10-CM

## 2021-06-15 ENCOUNTER — Other Ambulatory Visit: Payer: Self-pay | Admitting: Physician Assistant

## 2021-06-15 NOTE — Telephone Encounter (Signed)
Next Visit: 12/04/2021  Last Visit: 06/12/2021  Last Fill: 03/01/2021  Dx: Trapezius muscle spasm   Current Dose per office note on 06/12/2021: baclofen 10 mg twice daily as needed for muscle spasms.  Okay to refill Baclofen?

## 2021-06-27 DIAGNOSIS — M1811 Unilateral primary osteoarthritis of first carpometacarpal joint, right hand: Secondary | ICD-10-CM | POA: Diagnosis not present

## 2021-06-27 DIAGNOSIS — M65341 Trigger finger, right ring finger: Secondary | ICD-10-CM | POA: Diagnosis not present

## 2021-06-27 DIAGNOSIS — M79641 Pain in right hand: Secondary | ICD-10-CM | POA: Diagnosis not present

## 2021-07-04 DIAGNOSIS — J4521 Mild intermittent asthma with (acute) exacerbation: Secondary | ICD-10-CM | POA: Diagnosis not present

## 2021-07-09 ENCOUNTER — Telehealth: Payer: Self-pay | Admitting: Rheumatology

## 2021-07-09 NOTE — Telephone Encounter (Signed)
Patient request refill on Temazepam sent to [facility] on [address].

## 2021-07-09 NOTE — Telephone Encounter (Signed)
Next Visit: 12/04/2021  Last Visit: 06/12/2021  Last Fill: 06/01/2021  DX: Primary insomnia  Current Dose per office note on 06/12/2021: Restoril to 15 mg 1 tablet by mouth at bedtime as needed.  Okay to refill temazepam?

## 2021-07-10 ENCOUNTER — Other Ambulatory Visit: Payer: Self-pay

## 2021-07-10 ENCOUNTER — Ambulatory Visit (INDEPENDENT_AMBULATORY_CARE_PROVIDER_SITE_OTHER): Payer: Medicare Other | Admitting: Podiatry

## 2021-07-10 DIAGNOSIS — I872 Venous insufficiency (chronic) (peripheral): Secondary | ICD-10-CM

## 2021-07-10 DIAGNOSIS — B351 Tinea unguium: Secondary | ICD-10-CM | POA: Diagnosis not present

## 2021-07-10 DIAGNOSIS — E1142 Type 2 diabetes mellitus with diabetic polyneuropathy: Secondary | ICD-10-CM

## 2021-07-10 DIAGNOSIS — I129 Hypertensive chronic kidney disease with stage 1 through stage 4 chronic kidney disease, or unspecified chronic kidney disease: Secondary | ICD-10-CM | POA: Diagnosis not present

## 2021-07-10 DIAGNOSIS — E7800 Pure hypercholesterolemia, unspecified: Secondary | ICD-10-CM | POA: Diagnosis not present

## 2021-07-10 DIAGNOSIS — N182 Chronic kidney disease, stage 2 (mild): Secondary | ICD-10-CM | POA: Diagnosis not present

## 2021-07-10 DIAGNOSIS — M79675 Pain in left toe(s): Secondary | ICD-10-CM | POA: Diagnosis not present

## 2021-07-10 DIAGNOSIS — J40 Bronchitis, not specified as acute or chronic: Secondary | ICD-10-CM | POA: Diagnosis not present

## 2021-07-10 DIAGNOSIS — Q828 Other specified congenital malformations of skin: Secondary | ICD-10-CM

## 2021-07-10 DIAGNOSIS — M79674 Pain in right toe(s): Secondary | ICD-10-CM

## 2021-07-10 DIAGNOSIS — E1121 Type 2 diabetes mellitus with diabetic nephropathy: Secondary | ICD-10-CM | POA: Diagnosis not present

## 2021-07-10 DIAGNOSIS — G629 Polyneuropathy, unspecified: Secondary | ICD-10-CM | POA: Diagnosis not present

## 2021-07-10 DIAGNOSIS — E559 Vitamin D deficiency, unspecified: Secondary | ICD-10-CM | POA: Diagnosis not present

## 2021-07-10 DIAGNOSIS — Z1159 Encounter for screening for other viral diseases: Secondary | ICD-10-CM | POA: Diagnosis not present

## 2021-07-10 DIAGNOSIS — Z0000 Encounter for general adult medical examination without abnormal findings: Secondary | ICD-10-CM | POA: Diagnosis not present

## 2021-07-10 DIAGNOSIS — Z1389 Encounter for screening for other disorder: Secondary | ICD-10-CM | POA: Diagnosis not present

## 2021-07-10 DIAGNOSIS — J4520 Mild intermittent asthma, uncomplicated: Secondary | ICD-10-CM | POA: Diagnosis not present

## 2021-07-10 MED ORDER — TEMAZEPAM 15 MG PO CAPS: ORAL_CAPSULE | ORAL | 0 refills | Status: DC

## 2021-07-12 ENCOUNTER — Other Ambulatory Visit: Payer: Self-pay | Admitting: Family Medicine

## 2021-07-12 DIAGNOSIS — M8580 Other specified disorders of bone density and structure, unspecified site: Secondary | ICD-10-CM

## 2021-07-15 NOTE — Progress Notes (Signed)
Subjective: 70 year old female presents the office today for concerns of thick, discolored toenails that she cannot trim her self as well as a callus on her right foot.  No recent injury or changes otherwise since I last saw her.    Objective: AAO x3, NAD DP/PT pulses palpable bilaterally, CRT less than 3 seconds Chronic appearing edema present bilateral lower extremities. Hyperkeratotic lesion right foot submetatarsal without any underlying ulceration drainage or signs of infection.   Nails are hypertrophic, dystrophic, brittle, discolored, elongated 10. No surrounding redness or drainage. Tenderness nails 1-5 bilaterally. No open ulcerations. No significant pain to the ankle today. MMT 5/5 No pain with calf compression, warmth, erythema  Assessment: Hyperkeratotic lesion, symptomatic onychomycosis; edema  Plan: -All treatment options discussed with the patient including all alternatives, risks, complications.  -Nails sharply debrided x10 without any complications including -Hyperkeratotic lesion sharply debrided x1 without any complications or bleeding -Compression, elevation. -Patient encouraged to call the office with any questions, concerns, change in symptoms.   Trula Slade DPM

## 2021-07-26 DIAGNOSIS — J4551 Severe persistent asthma with (acute) exacerbation: Secondary | ICD-10-CM | POA: Diagnosis not present

## 2021-07-31 ENCOUNTER — Telehealth: Payer: Self-pay | Admitting: Pulmonary Disease

## 2021-07-31 NOTE — Telephone Encounter (Signed)
Pt is wanting to see another provider in the practice.  Last seen by AO on 02/16/20.  AO please advise if you are ok with pt seeing someone else.  Thanks

## 2021-08-02 NOTE — Telephone Encounter (Signed)
Okay with me 

## 2021-08-07 ENCOUNTER — Other Ambulatory Visit: Payer: Self-pay | Admitting: Family Medicine

## 2021-08-07 DIAGNOSIS — Z1231 Encounter for screening mammogram for malignant neoplasm of breast: Secondary | ICD-10-CM

## 2021-08-08 ENCOUNTER — Other Ambulatory Visit: Payer: Self-pay | Admitting: Physician Assistant

## 2021-08-09 NOTE — Telephone Encounter (Signed)
Next Visit: 12/04/2021   Last Visit: 06/12/2021   Last Fill: 07/10/2021  Dx: Primary insomnia    Current Dose per office note on 06/12/2021:Restoril to 15 mg 1 tablet by mouth at bedtime as needed.  Okay to refill Restoril?

## 2021-08-21 DIAGNOSIS — N182 Chronic kidney disease, stage 2 (mild): Secondary | ICD-10-CM | POA: Diagnosis not present

## 2021-09-04 ENCOUNTER — Telehealth: Payer: Self-pay | Admitting: *Deleted

## 2021-09-04 NOTE — Telephone Encounter (Signed)
Received a potential drug to drug interaction from [employer].   Possible interaction between: Restoril and Gabapentin.  Possible Risks: Drowsiness  Reviewed by: Hazel Sams, PA  Recommendations: Please make sure the patient is aware of the risks.  Patient takes gabapentin in the day time and Restoril at bedtime. Patient is aware taking medications concurrently can cause significant drowsiness and are not recommended for concurrent use.  Patient expressed understanding.

## 2021-09-07 ENCOUNTER — Other Ambulatory Visit: Payer: Self-pay

## 2021-09-07 ENCOUNTER — Ambulatory Visit
Admission: RE | Admit: 2021-09-07 | Discharge: 2021-09-07 | Disposition: A | Discharge status: Home / Self Care | Payer: Medicare Other | Source: Ambulatory Visit | Attending: Family Medicine | Admitting: Family Medicine

## 2021-09-07 DIAGNOSIS — Z1231 Encounter for screening mammogram for malignant neoplasm of breast: Secondary | ICD-10-CM

## 2021-09-13 ENCOUNTER — Other Ambulatory Visit: Payer: Self-pay | Admitting: Physician Assistant

## 2021-09-13 NOTE — Telephone Encounter (Signed)
Next Visit: 12/04/2021   Last Visit: 06/12/2021   Last Fill: 08/11/2021   Dx: Primary insomnia    Current Dose per office note on 06/12/2021:Restoril to 15 mg 1 tablet by mouth at bedtime as needed.   Okay to refill Restoril?

## 2021-09-14 ENCOUNTER — Telehealth: Payer: Self-pay | Admitting: Podiatry

## 2021-09-14 ENCOUNTER — Other Ambulatory Visit: Payer: Self-pay | Admitting: Podiatry

## 2021-09-14 MED ORDER — CEPHALEXIN 500 MG PO CAPS: 500.0000 mg | ORAL_CAPSULE | Freq: Three times a day (TID) | ORAL | 0 refills | Status: DC

## 2021-09-14 NOTE — Telephone Encounter (Signed)
Patient called the office stating she is having right foot second toe pain and it is swollen but no drainage. She wants to know if you can send her a prescription to the pharmacy or should she come in the office.    Please advise .Marland KitchenMarland KitchenMarland KitchenMarland Kitchen

## 2021-09-17 NOTE — Telephone Encounter (Signed)
Please schedule her a follow up for this week. - per Dr.Wagonger for her right foot second second toe.

## 2021-09-24 ENCOUNTER — Other Ambulatory Visit: Payer: Self-pay

## 2021-09-24 ENCOUNTER — Ambulatory Visit (INDEPENDENT_AMBULATORY_CARE_PROVIDER_SITE_OTHER): Payer: Medicare Other | Admitting: Podiatry

## 2021-09-24 DIAGNOSIS — Q828 Other specified congenital malformations of skin: Secondary | ICD-10-CM

## 2021-09-24 DIAGNOSIS — B351 Tinea unguium: Secondary | ICD-10-CM | POA: Diagnosis not present

## 2021-09-24 DIAGNOSIS — M79674 Pain in right toe(s): Secondary | ICD-10-CM | POA: Diagnosis not present

## 2021-09-24 DIAGNOSIS — E1142 Type 2 diabetes mellitus with diabetic polyneuropathy: Secondary | ICD-10-CM

## 2021-09-24 DIAGNOSIS — M79675 Pain in left toe(s): Secondary | ICD-10-CM | POA: Diagnosis not present

## 2021-09-26 NOTE — Progress Notes (Signed)
Subjective: 70 year old female presents the office today for concerns of thick, discolored toenails that she cannot trim her self as well as a callus on her right foot.  She had called stating that her right second toe had become swollen but there is no drainage.  She is on antibiotics and completed this but some tenderness.  Still no drainage or pus.  No recent injury or changes otherwise since I last saw her.    Objective: AAO x3, NAD DP/PT pulses palpable bilaterally, CRT less than 3 seconds Chronic appearing edema present bilateral lower extremities. Hyperkeratotic lesion right foot submetatarsal without any underlying ulceration drainage or signs of infection.   Nails are hypertrophic, dystrophic, brittle, discolored, elongated 10. No surrounding redness or drainage. Tenderness nails 1-5 bilaterally.  There is incurvation present to the right second digit toenails the nails also significantly elongated.  There is minimal edema of the nail border there is no erythema, drainage or pus or ascending cellulitis. No open ulcerations. No significant pain to the ankle today. MMT 5/5 No pain with calf compression, warmth, erythema  Assessment: Hyperkeratotic lesion, symptomatic onychomycosis; ingrown toenail  Plan: -All treatment options discussed with the patient including all alternatives, risks, complications.  -Nails sharply debrided x10 without any complications or bleeding.  I did sharply debride the central portion ingrown toenail right second digit nail border without any complications or bleeding.  Pain was improved after this but symptoms continue to need a partial nail avulsion. -Hyperkeratotic lesion sharply debrided x1 without any complications or bleeding -Compression, elevation. -Patient encouraged to call the office with any questions, concerns, change in symptoms.   Trula Slade DPM

## 2021-10-15 ENCOUNTER — Other Ambulatory Visit: Payer: Self-pay

## 2021-10-15 MED ORDER — TEMAZEPAM 15 MG PO CAPS: ORAL_CAPSULE | ORAL | 0 refills | Status: DC

## 2021-10-15 NOTE — Telephone Encounter (Signed)
Next Visit: 12/04/2021  Last Visit: 06/12/2021  Last Fill: 09/13/2021  DX: Primary insomnia   Current Dose per office note on 06/12/2021: Restoril to 15 mg 1 tablet by mouth at bedtime as needed.   Okay to refill temazepam?

## 2021-10-15 NOTE — Telephone Encounter (Signed)
Patient called requesting prescription refill of Temazepam to be sent to [facility] at [place].

## 2021-10-23 ENCOUNTER — Ambulatory Visit: Payer: Medicare Other | Admitting: Podiatry

## 2021-10-28 ENCOUNTER — Inpatient Hospital Stay (HOSPITAL_COMMUNITY)
Admission: EM | Admit: 2021-10-28 | Discharge: 2021-12-04 | DRG: 163 | Disposition: A | Discharge status: Rehabilitation Facility | Payer: Medicare PPO | Attending: Internal Medicine | Admitting: Internal Medicine

## 2021-10-28 ENCOUNTER — Inpatient Hospital Stay (HOSPITAL_COMMUNITY): Payer: Medicare PPO

## 2021-10-28 ENCOUNTER — Emergency Department (HOSPITAL_COMMUNITY): Payer: Medicare PPO

## 2021-10-28 ENCOUNTER — Encounter (HOSPITAL_COMMUNITY)
Admission: EM | Disposition: A | Discharge status: Rehabilitation Facility | Payer: Self-pay | Source: Home / Self Care | Attending: Critical Care Medicine

## 2021-10-28 DIAGNOSIS — Z9911 Dependence on respirator [ventilator] status: Secondary | ICD-10-CM

## 2021-10-28 DIAGNOSIS — E1165 Type 2 diabetes mellitus with hyperglycemia: Secondary | ICD-10-CM | POA: Diagnosis present

## 2021-10-28 DIAGNOSIS — R52 Pain, unspecified: Secondary | ICD-10-CM

## 2021-10-28 DIAGNOSIS — E8770 Fluid overload, unspecified: Secondary | ICD-10-CM | POA: Diagnosis present

## 2021-10-28 DIAGNOSIS — I639 Cerebral infarction, unspecified: Secondary | ICD-10-CM | POA: Diagnosis not present

## 2021-10-28 DIAGNOSIS — I953 Hypotension of hemodialysis: Secondary | ICD-10-CM

## 2021-10-28 DIAGNOSIS — Z992 Dependence on renal dialysis: Secondary | ICD-10-CM

## 2021-10-28 DIAGNOSIS — J9811 Atelectasis: Secondary | ICD-10-CM | POA: Diagnosis not present

## 2021-10-28 DIAGNOSIS — Z515 Encounter for palliative care: Secondary | ICD-10-CM | POA: Diagnosis not present

## 2021-10-28 DIAGNOSIS — K8020 Calculus of gallbladder without cholecystitis without obstruction: Secondary | ICD-10-CM | POA: Diagnosis not present

## 2021-10-28 DIAGNOSIS — R040 Epistaxis: Secondary | ICD-10-CM | POA: Diagnosis present

## 2021-10-28 DIAGNOSIS — N189 Chronic kidney disease, unspecified: Secondary | ICD-10-CM | POA: Diagnosis not present

## 2021-10-28 DIAGNOSIS — R188 Other ascites: Secondary | ICD-10-CM | POA: Diagnosis not present

## 2021-10-28 DIAGNOSIS — I700 Atherosclerosis of aorta: Secondary | ICD-10-CM | POA: Diagnosis not present

## 2021-10-28 DIAGNOSIS — D65 Disseminated intravascular coagulation [defibrination syndrome]: Secondary | ICD-10-CM | POA: Diagnosis present

## 2021-10-28 DIAGNOSIS — J9690 Respiratory failure, unspecified, unspecified whether with hypoxia or hypercapnia: Secondary | ICD-10-CM | POA: Diagnosis not present

## 2021-10-28 DIAGNOSIS — R1312 Dysphagia, oropharyngeal phase: Secondary | ICD-10-CM | POA: Diagnosis not present

## 2021-10-28 DIAGNOSIS — I129 Hypertensive chronic kidney disease with stage 1 through stage 4 chronic kidney disease, or unspecified chronic kidney disease: Secondary | ICD-10-CM | POA: Diagnosis present

## 2021-10-28 DIAGNOSIS — M797 Fibromyalgia: Secondary | ICD-10-CM | POA: Diagnosis present

## 2021-10-28 DIAGNOSIS — K661 Hemoperitoneum: Secondary | ICD-10-CM | POA: Diagnosis present

## 2021-10-28 DIAGNOSIS — N170 Acute kidney failure with tubular necrosis: Secondary | ICD-10-CM | POA: Diagnosis present

## 2021-10-28 DIAGNOSIS — J189 Pneumonia, unspecified organism: Secondary | ICD-10-CM | POA: Diagnosis not present

## 2021-10-28 DIAGNOSIS — F419 Anxiety disorder, unspecified: Secondary | ICD-10-CM | POA: Diagnosis present

## 2021-10-28 DIAGNOSIS — E1122 Type 2 diabetes mellitus with diabetic chronic kidney disease: Secondary | ICD-10-CM | POA: Diagnosis present

## 2021-10-28 DIAGNOSIS — F17210 Nicotine dependence, cigarettes, uncomplicated: Secondary | ICD-10-CM | POA: Diagnosis present

## 2021-10-28 DIAGNOSIS — J9600 Acute respiratory failure, unspecified whether with hypoxia or hypercapnia: Secondary | ICD-10-CM

## 2021-10-28 DIAGNOSIS — N1831 Chronic kidney disease, stage 3a: Secondary | ICD-10-CM | POA: Diagnosis present

## 2021-10-28 DIAGNOSIS — Z66 Do not resuscitate: Secondary | ICD-10-CM | POA: Diagnosis not present

## 2021-10-28 DIAGNOSIS — Z794 Long term (current) use of insulin: Secondary | ICD-10-CM

## 2021-10-28 DIAGNOSIS — N179 Acute kidney failure, unspecified: Secondary | ICD-10-CM

## 2021-10-28 DIAGNOSIS — I469 Cardiac arrest, cause unspecified: Secondary | ICD-10-CM

## 2021-10-28 DIAGNOSIS — F05 Delirium due to known physiological condition: Secondary | ICD-10-CM | POA: Diagnosis not present

## 2021-10-28 DIAGNOSIS — Z886 Allergy status to analgesic agent status: Secondary | ICD-10-CM

## 2021-10-28 DIAGNOSIS — G8929 Other chronic pain: Secondary | ICD-10-CM | POA: Diagnosis present

## 2021-10-28 DIAGNOSIS — R410 Disorientation, unspecified: Secondary | ICD-10-CM | POA: Diagnosis not present

## 2021-10-28 DIAGNOSIS — D696 Thrombocytopenia, unspecified: Secondary | ICD-10-CM

## 2021-10-28 DIAGNOSIS — G9341 Metabolic encephalopathy: Secondary | ICD-10-CM | POA: Diagnosis present

## 2021-10-28 DIAGNOSIS — D631 Anemia in chronic kidney disease: Secondary | ICD-10-CM | POA: Diagnosis present

## 2021-10-28 DIAGNOSIS — K922 Gastrointestinal hemorrhage, unspecified: Secondary | ICD-10-CM | POA: Diagnosis not present

## 2021-10-28 DIAGNOSIS — R531 Weakness: Secondary | ICD-10-CM | POA: Diagnosis not present

## 2021-10-28 DIAGNOSIS — R112 Nausea with vomiting, unspecified: Secondary | ICD-10-CM

## 2021-10-28 DIAGNOSIS — Z888 Allergy status to other drugs, medicaments and biological substances status: Secondary | ICD-10-CM

## 2021-10-28 DIAGNOSIS — I4901 Ventricular fibrillation: Secondary | ICD-10-CM | POA: Diagnosis present

## 2021-10-28 DIAGNOSIS — I499 Cardiac arrhythmia, unspecified: Secondary | ICD-10-CM | POA: Diagnosis not present

## 2021-10-28 DIAGNOSIS — D573 Sickle-cell trait: Secondary | ICD-10-CM | POA: Diagnosis present

## 2021-10-28 DIAGNOSIS — R160 Hepatomegaly, not elsewhere classified: Secondary | ICD-10-CM | POA: Diagnosis present

## 2021-10-28 DIAGNOSIS — I619 Nontraumatic intracerebral hemorrhage, unspecified: Secondary | ICD-10-CM | POA: Diagnosis present

## 2021-10-28 DIAGNOSIS — I2692 Saddle embolus of pulmonary artery without acute cor pulmonale: Secondary | ICD-10-CM | POA: Diagnosis not present

## 2021-10-28 DIAGNOSIS — E875 Hyperkalemia: Secondary | ICD-10-CM | POA: Diagnosis not present

## 2021-10-28 DIAGNOSIS — M79671 Pain in right foot: Secondary | ICD-10-CM | POA: Diagnosis not present

## 2021-10-28 DIAGNOSIS — E46 Unspecified protein-calorie malnutrition: Secondary | ICD-10-CM | POA: Diagnosis not present

## 2021-10-28 DIAGNOSIS — Z4682 Encounter for fitting and adjustment of non-vascular catheter: Secondary | ICD-10-CM | POA: Diagnosis not present

## 2021-10-28 DIAGNOSIS — Z833 Family history of diabetes mellitus: Secondary | ICD-10-CM

## 2021-10-28 DIAGNOSIS — Z882 Allergy status to sulfonamides status: Secondary | ICD-10-CM

## 2021-10-28 DIAGNOSIS — R638 Other symptoms and signs concerning food and fluid intake: Secondary | ICD-10-CM | POA: Diagnosis not present

## 2021-10-28 DIAGNOSIS — E119 Type 2 diabetes mellitus without complications: Secondary | ICD-10-CM | POA: Diagnosis not present

## 2021-10-28 DIAGNOSIS — Z01818 Encounter for other preprocedural examination: Secondary | ICD-10-CM

## 2021-10-28 DIAGNOSIS — I10 Essential (primary) hypertension: Secondary | ICD-10-CM | POA: Diagnosis not present

## 2021-10-28 DIAGNOSIS — R570 Cardiogenic shock: Secondary | ICD-10-CM | POA: Diagnosis present

## 2021-10-28 DIAGNOSIS — R0902 Hypoxemia: Secondary | ICD-10-CM

## 2021-10-28 DIAGNOSIS — M7731 Calcaneal spur, right foot: Secondary | ICD-10-CM | POA: Diagnosis not present

## 2021-10-28 DIAGNOSIS — J9601 Acute respiratory failure with hypoxia: Secondary | ICD-10-CM | POA: Diagnosis present

## 2021-10-28 DIAGNOSIS — M79604 Pain in right leg: Secondary | ICD-10-CM | POA: Diagnosis not present

## 2021-10-28 DIAGNOSIS — Z452 Encounter for adjustment and management of vascular access device: Secondary | ICD-10-CM

## 2021-10-28 DIAGNOSIS — R945 Abnormal results of liver function studies: Secondary | ICD-10-CM | POA: Diagnosis not present

## 2021-10-28 DIAGNOSIS — R6889 Other general symptoms and signs: Secondary | ICD-10-CM | POA: Diagnosis not present

## 2021-10-28 DIAGNOSIS — E889 Metabolic disorder, unspecified: Secondary | ICD-10-CM | POA: Diagnosis present

## 2021-10-28 DIAGNOSIS — Z8249 Family history of ischemic heart disease and other diseases of the circulatory system: Secondary | ICD-10-CM

## 2021-10-28 DIAGNOSIS — J90 Pleural effusion, not elsewhere classified: Secondary | ICD-10-CM | POA: Diagnosis not present

## 2021-10-28 DIAGNOSIS — Z4901 Encounter for fitting and adjustment of extracorporeal dialysis catheter: Secondary | ICD-10-CM | POA: Diagnosis not present

## 2021-10-28 DIAGNOSIS — Z87828 Personal history of other (healed) physical injury and trauma: Secondary | ICD-10-CM | POA: Diagnosis not present

## 2021-10-28 DIAGNOSIS — Z20822 Contact with and (suspected) exposure to covid-19: Secondary | ICD-10-CM | POA: Diagnosis present

## 2021-10-28 DIAGNOSIS — I959 Hypotension, unspecified: Secondary | ICD-10-CM | POA: Diagnosis not present

## 2021-10-28 DIAGNOSIS — J45901 Unspecified asthma with (acute) exacerbation: Secondary | ICD-10-CM | POA: Diagnosis present

## 2021-10-28 DIAGNOSIS — J4541 Moderate persistent asthma with (acute) exacerbation: Secondary | ICD-10-CM | POA: Diagnosis not present

## 2021-10-28 DIAGNOSIS — D6959 Other secondary thrombocytopenia: Secondary | ICD-10-CM | POA: Diagnosis present

## 2021-10-28 DIAGNOSIS — A419 Sepsis, unspecified organism: Secondary | ICD-10-CM | POA: Diagnosis not present

## 2021-10-28 DIAGNOSIS — Z931 Gastrostomy status: Secondary | ICD-10-CM | POA: Diagnosis not present

## 2021-10-28 DIAGNOSIS — E785 Hyperlipidemia, unspecified: Secondary | ICD-10-CM | POA: Diagnosis present

## 2021-10-28 DIAGNOSIS — R34 Anuria and oliguria: Secondary | ICD-10-CM | POA: Diagnosis not present

## 2021-10-28 DIAGNOSIS — R7881 Bacteremia: Secondary | ICD-10-CM | POA: Diagnosis not present

## 2021-10-28 DIAGNOSIS — G9340 Encephalopathy, unspecified: Secondary | ICD-10-CM | POA: Diagnosis not present

## 2021-10-28 DIAGNOSIS — R4182 Altered mental status, unspecified: Secondary | ICD-10-CM | POA: Diagnosis not present

## 2021-10-28 DIAGNOSIS — D62 Acute posthemorrhagic anemia: Secondary | ICD-10-CM | POA: Diagnosis present

## 2021-10-28 DIAGNOSIS — M7989 Other specified soft tissue disorders: Secondary | ICD-10-CM | POA: Diagnosis not present

## 2021-10-28 DIAGNOSIS — Z4659 Encounter for fitting and adjustment of other gastrointestinal appliance and device: Secondary | ICD-10-CM

## 2021-10-28 DIAGNOSIS — Z743 Need for continuous supervision: Secondary | ICD-10-CM | POA: Diagnosis not present

## 2021-10-28 DIAGNOSIS — K828 Other specified diseases of gallbladder: Secondary | ICD-10-CM | POA: Diagnosis not present

## 2021-10-28 DIAGNOSIS — T380X5A Adverse effect of glucocorticoids and synthetic analogues, initial encounter: Secondary | ICD-10-CM | POA: Diagnosis not present

## 2021-10-28 DIAGNOSIS — D649 Anemia, unspecified: Secondary | ICD-10-CM | POA: Diagnosis not present

## 2021-10-28 DIAGNOSIS — Z9889 Other specified postprocedural states: Secondary | ICD-10-CM | POA: Diagnosis not present

## 2021-10-28 DIAGNOSIS — M1990 Unspecified osteoarthritis, unspecified site: Secondary | ICD-10-CM | POA: Diagnosis present

## 2021-10-28 DIAGNOSIS — M25579 Pain in unspecified ankle and joints of unspecified foot: Secondary | ICD-10-CM

## 2021-10-28 DIAGNOSIS — K30 Functional dyspepsia: Secondary | ICD-10-CM | POA: Diagnosis not present

## 2021-10-28 DIAGNOSIS — R404 Transient alteration of awareness: Secondary | ICD-10-CM | POA: Diagnosis not present

## 2021-10-28 DIAGNOSIS — K6389 Other specified diseases of intestine: Secondary | ICD-10-CM | POA: Diagnosis not present

## 2021-10-28 DIAGNOSIS — R110 Nausea: Secondary | ICD-10-CM | POA: Diagnosis not present

## 2021-10-28 DIAGNOSIS — N2581 Secondary hyperparathyroidism of renal origin: Secondary | ICD-10-CM | POA: Diagnosis not present

## 2021-10-28 DIAGNOSIS — I462 Cardiac arrest due to underlying cardiac condition: Secondary | ICD-10-CM | POA: Diagnosis present

## 2021-10-28 DIAGNOSIS — I2602 Saddle embolus of pulmonary artery with acute cor pulmonale: Secondary | ICD-10-CM | POA: Diagnosis not present

## 2021-10-28 DIAGNOSIS — Z885 Allergy status to narcotic agent status: Secondary | ICD-10-CM

## 2021-10-28 DIAGNOSIS — D61818 Other pancytopenia: Secondary | ICD-10-CM

## 2021-10-28 DIAGNOSIS — N390 Urinary tract infection, site not specified: Secondary | ICD-10-CM | POA: Diagnosis not present

## 2021-10-28 DIAGNOSIS — S36112A Contusion of liver, initial encounter: Secondary | ICD-10-CM | POA: Diagnosis not present

## 2021-10-28 DIAGNOSIS — D72829 Elevated white blood cell count, unspecified: Secondary | ICD-10-CM | POA: Diagnosis not present

## 2021-10-28 DIAGNOSIS — E43 Unspecified severe protein-calorie malnutrition: Secondary | ICD-10-CM | POA: Insufficient documentation

## 2021-10-28 DIAGNOSIS — Z79899 Other long term (current) drug therapy: Secondary | ICD-10-CM

## 2021-10-28 DIAGNOSIS — S99911A Unspecified injury of right ankle, initial encounter: Secondary | ICD-10-CM | POA: Diagnosis not present

## 2021-10-28 DIAGNOSIS — E876 Hypokalemia: Secondary | ICD-10-CM | POA: Diagnosis present

## 2021-10-28 DIAGNOSIS — Z881 Allergy status to other antibiotic agents status: Secondary | ICD-10-CM

## 2021-10-28 DIAGNOSIS — K7200 Acute and subacute hepatic failure without coma: Secondary | ICD-10-CM | POA: Diagnosis present

## 2021-10-28 DIAGNOSIS — R7989 Other specified abnormal findings of blood chemistry: Secondary | ICD-10-CM | POA: Diagnosis present

## 2021-10-28 DIAGNOSIS — R739 Hyperglycemia, unspecified: Secondary | ICD-10-CM | POA: Diagnosis not present

## 2021-10-28 DIAGNOSIS — E871 Hypo-osmolality and hyponatremia: Secondary | ICD-10-CM | POA: Diagnosis not present

## 2021-10-28 DIAGNOSIS — K7689 Other specified diseases of liver: Secondary | ICD-10-CM | POA: Diagnosis not present

## 2021-10-28 DIAGNOSIS — D75839 Thrombocytosis, unspecified: Secondary | ICD-10-CM | POA: Diagnosis present

## 2021-10-28 DIAGNOSIS — L89810 Pressure ulcer of head, unstageable: Secondary | ICD-10-CM | POA: Diagnosis not present

## 2021-10-28 DIAGNOSIS — E1129 Type 2 diabetes mellitus with other diabetic kidney complication: Secondary | ICD-10-CM | POA: Diagnosis not present

## 2021-10-28 DIAGNOSIS — F32A Depression, unspecified: Secondary | ICD-10-CM | POA: Diagnosis present

## 2021-10-28 DIAGNOSIS — E874 Mixed disorder of acid-base balance: Secondary | ICD-10-CM | POA: Diagnosis present

## 2021-10-28 DIAGNOSIS — J9602 Acute respiratory failure with hypercapnia: Secondary | ICD-10-CM | POA: Diagnosis present

## 2021-10-28 DIAGNOSIS — L299 Pruritus, unspecified: Secondary | ICD-10-CM | POA: Diagnosis not present

## 2021-10-28 DIAGNOSIS — G931 Anoxic brain damage, not elsewhere classified: Secondary | ICD-10-CM | POA: Diagnosis not present

## 2021-10-28 DIAGNOSIS — J690 Pneumonitis due to inhalation of food and vomit: Secondary | ICD-10-CM | POA: Diagnosis not present

## 2021-10-28 DIAGNOSIS — K219 Gastro-esophageal reflux disease without esophagitis: Secondary | ICD-10-CM | POA: Diagnosis present

## 2021-10-28 DIAGNOSIS — I2699 Other pulmonary embolism without acute cor pulmonale: Secondary | ICD-10-CM | POA: Diagnosis not present

## 2021-10-28 DIAGNOSIS — R042 Hemoptysis: Secondary | ICD-10-CM | POA: Diagnosis not present

## 2021-10-28 DIAGNOSIS — D490 Neoplasm of unspecified behavior of digestive system: Secondary | ICD-10-CM | POA: Diagnosis not present

## 2021-10-28 DIAGNOSIS — R5381 Other malaise: Secondary | ICD-10-CM | POA: Diagnosis not present

## 2021-10-28 HISTORY — DX: Cardiac arrest, cause unspecified: I46.9

## 2021-10-28 LAB — I-STAT CHEM 8, ED
BUN: 16 mg/dL (ref 8–23)
BUN: 16 mg/dL (ref 8–23)
Calcium, Ion: 0.83 mmol/L — CL (ref 1.15–1.40)
Calcium, Ion: 0.83 mmol/L — CL (ref 1.15–1.40)
Chloride: 119 mmol/L — ABNORMAL HIGH (ref 98–111)
Chloride: 120 mmol/L — ABNORMAL HIGH (ref 98–111)
Creatinine, Ser: 1.1 mg/dL — ABNORMAL HIGH (ref 0.44–1.00)
Creatinine, Ser: 1.1 mg/dL — ABNORMAL HIGH (ref 0.44–1.00)
Glucose, Bld: 185 mg/dL — ABNORMAL HIGH (ref 70–99)
Glucose, Bld: 187 mg/dL — ABNORMAL HIGH (ref 70–99)
HCT: 26 % — ABNORMAL LOW (ref 36.0–46.0)
HCT: 26 % — ABNORMAL LOW (ref 36.0–46.0)
Hemoglobin: 8.8 g/dL — ABNORMAL LOW (ref 12.0–15.0)
Hemoglobin: 8.8 g/dL — ABNORMAL LOW (ref 12.0–15.0)
Potassium: 2.9 mmol/L — ABNORMAL LOW (ref 3.5–5.1)
Potassium: 3 mmol/L — ABNORMAL LOW (ref 3.5–5.1)
Sodium: 148 mmol/L — ABNORMAL HIGH (ref 135–145)
Sodium: 149 mmol/L — ABNORMAL HIGH (ref 135–145)
TCO2: 13 mmol/L — ABNORMAL LOW (ref 22–32)
TCO2: 13 mmol/L — ABNORMAL LOW (ref 22–32)

## 2021-10-28 LAB — RAPID URINE DRUG SCREEN, HOSP PERFORMED
Amphetamines: NOT DETECTED
Barbiturates: NOT DETECTED
Benzodiazepines: POSITIVE — AB
Cocaine: NOT DETECTED
Opiates: NOT DETECTED
Tetrahydrocannabinol: NOT DETECTED

## 2021-10-28 LAB — URINALYSIS, ROUTINE W REFLEX MICROSCOPIC
Bilirubin Urine: NEGATIVE
Glucose, UA: >=500 mg/dL — AB
Ketones, ur: NEGATIVE mg/dL
Leukocytes,Ua: NEGATIVE
Nitrite: NEGATIVE
Protein, ur: >=300 mg/dL — AB
Specific Gravity, Urine: 1.008 (ref 1.005–1.030)
pH: 7 (ref 5.0–8.0)

## 2021-10-28 LAB — POCT I-STAT 7, (LYTES, BLD GAS, ICA,H+H)
Acid-base deficit: 12 mmol/L — ABNORMAL HIGH (ref 0.0–2.0)
Bicarbonate: 16.9 mmol/L — ABNORMAL LOW (ref 20.0–28.0)
Calcium, Ion: 1.26 mmol/L (ref 1.15–1.40)
HCT: 35 % — ABNORMAL LOW (ref 36.0–46.0)
Hemoglobin: 11.9 g/dL — ABNORMAL LOW (ref 12.0–15.0)
O2 Saturation: 88 %
Patient temperature: 35.6
Potassium: 4.1 mmol/L (ref 3.5–5.1)
Sodium: 144 mmol/L (ref 135–145)
TCO2: 18 mmol/L — ABNORMAL LOW (ref 22–32)
pCO2 arterial: 44.7 mmHg (ref 32.0–48.0)
pH, Arterial: 7.178 — CL (ref 7.350–7.450)
pO2, Arterial: 63 mmHg — ABNORMAL LOW (ref 83.0–108.0)

## 2021-10-28 LAB — I-STAT ARTERIAL BLOOD GAS, ED
Acid-base deficit: 20 mmol/L — ABNORMAL HIGH (ref 0.0–2.0)
Acid-base deficit: 7 mmol/L — ABNORMAL HIGH (ref 0.0–2.0)
Bicarbonate: 12.6 mmol/L — ABNORMAL LOW (ref 20.0–28.0)
Bicarbonate: 21.5 mmol/L (ref 20.0–28.0)
Calcium, Ion: 1.55 mmol/L (ref 1.15–1.40)
Calcium, Ion: 1.69 mmol/L (ref 1.15–1.40)
HCT: 27 % — ABNORMAL LOW (ref 36.0–46.0)
HCT: 32 % — ABNORMAL LOW (ref 36.0–46.0)
Hemoglobin: 10.9 g/dL — ABNORMAL LOW (ref 12.0–15.0)
Hemoglobin: 9.2 g/dL — ABNORMAL LOW (ref 12.0–15.0)
O2 Saturation: 100 %
O2 Saturation: 100 %
Potassium: 3.8 mmol/L (ref 3.5–5.1)
Potassium: 4.7 mmol/L (ref 3.5–5.1)
Sodium: 142 mmol/L (ref 135–145)
Sodium: 142 mmol/L (ref 135–145)
TCO2: 14 mmol/L — ABNORMAL LOW (ref 22–32)
TCO2: 23 mmol/L (ref 22–32)
pCO2 arterial: 61.4 mmHg — ABNORMAL HIGH (ref 32.0–48.0)
pCO2 arterial: 61.6 mmHg — ABNORMAL HIGH (ref 32.0–48.0)
pH, Arterial: 6.921 — CL (ref 7.350–7.450)
pH, Arterial: 7.151 — CL (ref 7.350–7.450)
pO2, Arterial: 340 mmHg — ABNORMAL HIGH (ref 83.0–108.0)
pO2, Arterial: 377 mmHg — ABNORMAL HIGH (ref 83.0–108.0)

## 2021-10-28 LAB — CBC
HCT: 36.2 % (ref 36.0–46.0)
HCT: 37.5 % (ref 36.0–46.0)
HCT: 38.4 % (ref 36.0–46.0)
Hemoglobin: 11.5 g/dL — ABNORMAL LOW (ref 12.0–15.0)
Hemoglobin: 11.9 g/dL — ABNORMAL LOW (ref 12.0–15.0)
Hemoglobin: 12.1 g/dL (ref 12.0–15.0)
MCH: 27.3 pg (ref 26.0–34.0)
MCH: 27.4 pg (ref 26.0–34.0)
MCH: 26.9 pg (ref 26.0–34.0)
MCHC: 31.7 g/dL (ref 30.0–36.0)
MCHC: 31.8 g/dL (ref 30.0–36.0)
MCHC: 31.5 g/dL (ref 30.0–36.0)
MCV: 86 fL (ref 80.0–100.0)
MCV: 86.2 fL (ref 80.0–100.0)
MCV: 85.5 fL (ref 80.0–100.0)
Platelets: 197 10*3/uL (ref 150–400)
Platelets: 197 10*3/uL (ref 150–400)
Platelets: 198 10*3/uL (ref 150–400)
RBC: 4.21 MIL/uL (ref 3.87–5.11)
RBC: 4.35 MIL/uL (ref 3.87–5.11)
RBC: 4.49 MIL/uL (ref 3.87–5.11)
RDW: 14.4 % (ref 11.5–15.5)
RDW: 14.5 % (ref 11.5–15.5)
RDW: 14.5 % (ref 11.5–15.5)
WBC: 23 10*3/uL — ABNORMAL HIGH (ref 4.0–10.5)
WBC: 23.7 10*3/uL — ABNORMAL HIGH (ref 4.0–10.5)
WBC: 27 10*3/uL — ABNORMAL HIGH (ref 4.0–10.5)
nRBC: 0.1 % (ref 0.0–0.2)
nRBC: 0.2 % (ref 0.0–0.2)
nRBC: 0.1 % (ref 0.0–0.2)

## 2021-10-28 LAB — CBC WITH DIFFERENTIAL/PLATELET
Abs Immature Granulocytes: 0.02 10*3/uL (ref 0.00–0.07)
Basophils Absolute: 0 10*3/uL (ref 0.0–0.1)
Basophils Relative: 0 %
Eosinophils Absolute: 0 10*3/uL (ref 0.0–0.5)
Eosinophils Relative: 1 %
HCT: 19.8 % — ABNORMAL LOW (ref 36.0–46.0)
Hemoglobin: 5.5 g/dL — CL (ref 12.0–15.0)
Immature Granulocytes: 1 %
Lymphocytes Relative: 78 %
Lymphs Abs: 2.5 10*3/uL (ref 0.7–4.0)
MCH: 27.2 pg (ref 26.0–34.0)
MCHC: 27.8 g/dL — ABNORMAL LOW (ref 30.0–36.0)
MCV: 98 fL (ref 80.0–100.0)
Monocytes Absolute: 0.1 10*3/uL (ref 0.1–1.0)
Monocytes Relative: 3 %
Neutro Abs: 0.6 10*3/uL — ABNORMAL LOW (ref 1.7–7.7)
Neutrophils Relative %: 17 %
Platelets: 13 10*3/uL — CL (ref 150–400)
RBC: 2.02 MIL/uL — ABNORMAL LOW (ref 3.87–5.11)
RDW: 13.4 % (ref 11.5–15.5)
WBC: 3.3 10*3/uL — ABNORMAL LOW (ref 4.0–10.5)
nRBC: 0 % (ref 0.0–0.2)

## 2021-10-28 LAB — RESP PANEL BY RT-PCR (FLU A&B, COVID) ARPGX2
Influenza A by PCR: NEGATIVE
Influenza B by PCR: NEGATIVE
SARS Coronavirus 2 by RT PCR: NEGATIVE

## 2021-10-28 LAB — HEMOGLOBIN A1C
Hgb A1c MFr Bld: 9 % — ABNORMAL HIGH (ref 4.8–5.6)
Mean Plasma Glucose: 211.6 mg/dL

## 2021-10-28 LAB — GLUCOSE, CAPILLARY
Glucose-Capillary: 362 mg/dL — ABNORMAL HIGH (ref 70–99)
Glucose-Capillary: 412 mg/dL — ABNORMAL HIGH (ref 70–99)
Glucose-Capillary: 477 mg/dL — ABNORMAL HIGH (ref 70–99)

## 2021-10-28 LAB — ECHOCARDIOGRAM COMPLETE: S' Lateral: 2.1 cm

## 2021-10-28 LAB — BRAIN NATRIURETIC PEPTIDE: B Natriuretic Peptide: 125.3 pg/mL — ABNORMAL HIGH (ref 0.0–100.0)

## 2021-10-28 LAB — LACTIC ACID, PLASMA
Lactic Acid, Venous: >9 mmol/L (ref 0.5–1.9)
Lactic Acid, Venous: >9 mmol/L (ref 0.5–1.9)
Lactic Acid, Venous: >9 mmol/L (ref 0.5–1.9)

## 2021-10-28 LAB — PROCALCITONIN: Procalcitonin: <0.1 ng/mL

## 2021-10-28 LAB — BASIC METABOLIC PANEL
Anion gap: 23 — ABNORMAL HIGH (ref 5–15)
BUN: 27 mg/dL — ABNORMAL HIGH (ref 8–23)
CO2: 14 mmol/L — ABNORMAL LOW (ref 22–32)
Calcium: 8.9 mg/dL (ref 8.9–10.3)
Chloride: 105 mmol/L (ref 98–111)
Creatinine, Ser: 2.69 mg/dL — ABNORMAL HIGH (ref 0.44–1.00)
GFR, Estimated: 18 mL/min — ABNORMAL LOW (ref >60)
Glucose, Bld: 472 mg/dL — ABNORMAL HIGH (ref 70–99)
Potassium: 4.3 mmol/L (ref 3.5–5.1)
Sodium: 142 mmol/L (ref 135–145)

## 2021-10-28 LAB — PROTIME-INR
INR: 3.1 — ABNORMAL HIGH (ref 0.8–1.2)
Prothrombin Time: 32.1 seconds — ABNORMAL HIGH (ref 11.4–15.2)

## 2021-10-28 LAB — TROPONIN I (HIGH SENSITIVITY)
Troponin I (High Sensitivity): 1525 ng/L (ref <18)
Troponin I (High Sensitivity): 3245 ng/L (ref <18)
Troponin I (High Sensitivity): 3796 ng/L (ref <18)
Troponin I (High Sensitivity): 4845 ng/L (ref <18)

## 2021-10-28 LAB — MAGNESIUM: Magnesium: 2.7 mg/dL — ABNORMAL HIGH (ref 1.7–2.4)

## 2021-10-28 LAB — ABO/RH: ABO/RH(D): A POS

## 2021-10-28 LAB — MRSA NEXT GEN BY PCR, NASAL: MRSA by PCR Next Gen: NOT DETECTED

## 2021-10-28 SURGERY — INVASIVE LAB ABORTED CASE

## 2021-10-28 MED ORDER — LORAZEPAM 2 MG/ML IJ SOLN
2.0000 mg | Freq: Once | INTRAMUSCULAR | Status: AC
  Administered 2021-10-28: 2 mg via INTRAVENOUS

## 2021-10-28 MED ORDER — MAGNESIUM SULFATE 2 GM/50ML IV SOLN
2.0000 g | Freq: Once | INTRAVENOUS | Status: AC
Start: 2021-10-28 — End: 2021-10-28
  Administered 2021-10-28: 2 g via INTRAVENOUS
  Filled 2021-10-28: qty 50

## 2021-10-28 MED ORDER — EPINEPHRINE 1 MG/10ML IJ SOSY
PREFILLED_SYRINGE | INTRAMUSCULAR | Status: AC | PRN
  Administered 2021-10-28: 1 mg via INTRAVENOUS

## 2021-10-28 MED ORDER — FENTANYL CITRATE PF 50 MCG/ML IJ SOSY: 25.0000 ug | PREFILLED_SYRINGE | Freq: Once | INTRAMUSCULAR | Status: DC

## 2021-10-28 MED ORDER — DOCUSATE SODIUM 50 MG/5ML PO LIQD
100.0000 mg | Freq: Two times a day (BID) | ORAL | Status: DC
  Administered 2021-10-29 – 2021-11-02 (×7): 100 mg
  Filled 2021-10-28 (×9): qty 10

## 2021-10-28 MED ORDER — INSULIN ASPART 100 UNIT/ML IJ SOLN
8.0000 [IU] | Freq: Once | INTRAMUSCULAR | Status: AC
  Administered 2021-10-29: 8 [IU] via SUBCUTANEOUS

## 2021-10-28 MED ORDER — SODIUM CHLORIDE 0.9 % IV BOLUS
1000.0000 mL | Freq: Once | INTRAVENOUS | Status: AC
  Administered 2021-10-28: 1000 mL via INTRAVENOUS

## 2021-10-28 MED ORDER — HEPARIN (PORCINE) 25000 UT/250ML-% IV SOLN
1200.0000 [IU]/h | INTRAVENOUS | Status: DC
  Administered 2021-10-28: 23:00:00 650 [IU]/h via INTRAVENOUS
  Administered 2021-10-30: 1000 [IU]/h via INTRAVENOUS
  Filled 2021-10-28 (×2): qty 250

## 2021-10-28 MED ORDER — PANTOPRAZOLE SODIUM 40 MG IV SOLR: 40.0000 mg | Freq: Every day | INTRAVENOUS | Status: DC

## 2021-10-28 MED ORDER — CALCIUM CHLORIDE 10 % IV SOLN
INTRAVENOUS | Status: AC | PRN
  Administered 2021-10-28 (×2): 1 g via INTRAVENOUS

## 2021-10-28 MED ORDER — POTASSIUM CHLORIDE 10 MEQ/50ML IV SOLN
10.0000 meq | INTRAVENOUS | Status: AC
  Administered 2021-10-28 (×4): 10 meq via INTRAVENOUS
  Filled 2021-10-28 (×4): qty 50

## 2021-10-28 MED ORDER — IOHEXOL 300 MG/ML  SOLN
100.0000 mL | Freq: Once | INTRAMUSCULAR | Status: AC | PRN
  Administered 2021-10-28: 100 mL via INTRAVENOUS

## 2021-10-28 MED ORDER — HEPARIN SODIUM (PORCINE) 1000 UNIT/ML IJ SOLN
INTRAMUSCULAR | Status: AC
  Filled 2021-10-28: qty 10

## 2021-10-28 MED ORDER — FENTANYL BOLUS VIA INFUSION
25.0000 ug | INTRAVENOUS | Status: DC | PRN
  Administered 2021-10-30 (×3): 50 ug via INTRAVENOUS
  Administered 2021-11-03: 25 ug via INTRAVENOUS
  Filled 2021-10-28: qty 100

## 2021-10-28 MED ORDER — POLYETHYLENE GLYCOL 3350 17 G PO PACK
17.0000 g | PACK | Freq: Every day | ORAL | Status: DC
  Administered 2021-10-31 – 2021-11-02 (×3): 17 g
  Filled 2021-10-28 (×4): qty 1

## 2021-10-28 MED ORDER — DOCUSATE SODIUM 100 MG PO CAPS: 100.0000 mg | ORAL_CAPSULE | Freq: Two times a day (BID) | ORAL | Status: DC | PRN

## 2021-10-28 MED ORDER — FENTANYL 2500MCG IN NS 250ML (10MCG/ML) PREMIX INFUSION
25.0000 ug/h | INTRAVENOUS | Status: DC
  Filled 2021-10-28: qty 250

## 2021-10-28 MED ORDER — PROPOFOL 1000 MG/100ML IV EMUL
0.0000 ug/kg/min | INTRAVENOUS | Status: DC
  Administered 2021-10-28: 5 ug/kg/min via INTRAVENOUS
  Administered 2021-10-29: 10 ug/kg/min via INTRAVENOUS
  Administered 2021-10-29 – 2021-10-30 (×2): 20 ug/kg/min via INTRAVENOUS
  Administered 2021-10-30: 10 ug/kg/min via INTRAVENOUS
  Administered 2021-10-31: 15 ug/kg/min via INTRAVENOUS
  Filled 2021-10-28 (×7): qty 100

## 2021-10-28 MED ORDER — NOREPINEPHRINE 4 MG/250ML-% IV SOLN: 2.0000 ug/min | INTRAVENOUS | Status: DC

## 2021-10-28 MED ORDER — ACETAMINOPHEN 650 MG RE SUPP
650.0000 mg | RECTAL | Status: DC | PRN
  Filled 2021-10-28: qty 1

## 2021-10-28 MED ORDER — FENTANYL BOLUS VIA INFUSION
25.0000 ug | INTRAVENOUS | Status: DC | PRN
  Administered 2021-11-03: 25 ug via INTRAVENOUS
  Filled 2021-10-28: qty 25

## 2021-10-28 MED ORDER — VERAPAMIL HCL 2.5 MG/ML IV SOLN
INTRAVENOUS | Status: AC
  Filled 2021-10-28: qty 2

## 2021-10-28 MED ORDER — ACETAMINOPHEN 325 MG PO TABS
650.0000 mg | ORAL_TABLET | ORAL | Status: DC | PRN
  Administered 2021-11-21 – 2021-12-01 (×7): 650 mg via ORAL
  Filled 2021-10-28 (×7): qty 2

## 2021-10-28 MED ORDER — HEPARIN (PORCINE) IN NACL 1000-0.9 UT/500ML-% IV SOLN
INTRAVENOUS | Status: AC
  Filled 2021-10-28: qty 1000

## 2021-10-28 MED ORDER — ACETAMINOPHEN 650 MG RE SUPP: 650.0000 mg | RECTAL | Status: DC

## 2021-10-28 MED ORDER — SODIUM CHLORIDE 0.9 % IV SOLN
250.0000 mL | INTRAVENOUS | Status: DC
  Administered 2021-11-07: 250 mL via INTRAVENOUS

## 2021-10-28 MED ORDER — ONDANSETRON HCL 4 MG/2ML IJ SOLN
4.0000 mg | Freq: Four times a day (QID) | INTRAMUSCULAR | Status: DC | PRN
  Administered 2021-11-18 – 2021-12-02 (×3): 4 mg via INTRAVENOUS
  Filled 2021-10-28 (×3): qty 2

## 2021-10-28 MED ORDER — SODIUM CHLORIDE 0.9 % IV SOLN
2.0000 g | Freq: Three times a day (TID) | INTRAVENOUS | Status: DC
  Administered 2021-10-29: 2 g via INTRAVENOUS
  Filled 2021-10-28: qty 2

## 2021-10-28 MED ORDER — FENTANYL 2500MCG IN NS 250ML (10MCG/ML) PREMIX INFUSION
25.0000 ug/h | INTRAVENOUS | Status: DC
  Administered 2021-10-28: 25 ug/h via INTRAVENOUS
  Administered 2021-10-29: 100 ug/h via INTRAVENOUS
  Administered 2021-10-30: 150 ug/h via INTRAVENOUS
  Administered 2021-10-30: 75 ug/h via INTRAVENOUS
  Administered 2021-10-31: 175 ug/h via INTRAVENOUS
  Administered 2021-10-31: 200 ug/h via INTRAVENOUS
  Administered 2021-11-01 – 2021-11-02 (×2): 125 ug/h via INTRAVENOUS
  Administered 2021-11-03: 150 ug/h via INTRAVENOUS
  Filled 2021-10-28 (×8): qty 250

## 2021-10-28 MED ORDER — VANCOMYCIN HCL 750 MG/150ML IV SOLN: 750.0000 mg | Freq: Two times a day (BID) | INTRAVENOUS | Status: DC

## 2021-10-28 MED ORDER — CALCIUM GLUCONATE-NACL 2-0.675 GM/100ML-% IV SOLN
2.0000 g | Freq: Once | INTRAVENOUS | Status: AC
  Administered 2021-10-28: 2000 mg via INTRAVENOUS
  Filled 2021-10-28: qty 100

## 2021-10-28 MED ORDER — VANCOMYCIN HCL 1500 MG/300ML IV SOLN
1500.0000 mg | Freq: Once | INTRAVENOUS | Status: AC
  Administered 2021-10-28: 1500 mg via INTRAVENOUS
  Filled 2021-10-28: qty 300

## 2021-10-28 MED ORDER — ACETAMINOPHEN 325 MG PO TABS: 650.0000 mg | ORAL_TABLET | ORAL | Status: DC

## 2021-10-28 MED ORDER — PANTOPRAZOLE SODIUM 40 MG IV SOLR
40.0000 mg | Freq: Two times a day (BID) | INTRAVENOUS | Status: DC
  Administered 2021-10-28 – 2021-11-06 (×18): 40 mg via INTRAVENOUS
  Filled 2021-10-28 (×18): qty 40

## 2021-10-28 MED ORDER — SODIUM CHLORIDE 0.9 % IV SOLN: INTRAVENOUS | Status: DC | PRN

## 2021-10-28 MED ORDER — SODIUM CHLORIDE 0.9 % IV SOLN
2.0000 g | Freq: Two times a day (BID) | INTRAVENOUS | Status: DC
  Administered 2021-10-29: 2 g via INTRAVENOUS
  Filled 2021-10-28: qty 2

## 2021-10-28 MED ORDER — SODIUM CHLORIDE 0.9% IV SOLUTION: Freq: Once | INTRAVENOUS | Status: DC

## 2021-10-28 MED ORDER — SODIUM BICARBONATE 8.4 % IV SOLN
100.0000 meq | Freq: Once | INTRAVENOUS | Status: AC
  Administered 2021-10-28: 100 meq via INTRAVENOUS
  Filled 2021-10-28: qty 100

## 2021-10-28 MED ORDER — POLYETHYLENE GLYCOL 3350 17 G PO PACK: 17.0000 g | PACK | Freq: Every day | ORAL | Status: DC | PRN

## 2021-10-28 MED ORDER — VANCOMYCIN VARIABLE DOSE PER UNSTABLE RENAL FUNCTION (PHARMACIST DOSING): Status: DC

## 2021-10-28 MED ORDER — ARFORMOTEROL TARTRATE 15 MCG/2ML IN NEBU
15.0000 ug | INHALATION_SOLUTION | Freq: Two times a day (BID) | RESPIRATORY_TRACT | Status: DC
  Administered 2021-10-28 – 2021-10-31 (×7): 15 ug via RESPIRATORY_TRACT
  Filled 2021-10-28 (×6): qty 2

## 2021-10-28 MED ORDER — IPRATROPIUM-ALBUTEROL 0.5-2.5 (3) MG/3ML IN SOLN
3.0000 mL | RESPIRATORY_TRACT | Status: DC | PRN
  Administered 2021-11-10 – 2021-11-15 (×6): 3 mL via RESPIRATORY_TRACT
  Filled 2021-10-28 (×6): qty 3

## 2021-10-28 MED ORDER — ACETAMINOPHEN 160 MG/5ML PO SOLN: 650.0000 mg | ORAL | Status: DC

## 2021-10-28 MED ORDER — STERILE WATER FOR INJECTION IV SOLN
INTRAVENOUS | Status: DC
  Filled 2021-10-28 (×4): qty 1000

## 2021-10-28 MED ORDER — BUDESONIDE 0.5 MG/2ML IN SUSP
0.5000 mg | Freq: Two times a day (BID) | RESPIRATORY_TRACT | Status: DC
  Administered 2021-10-28 – 2021-10-31 (×7): 0.5 mg via RESPIRATORY_TRACT
  Filled 2021-10-28 (×7): qty 2

## 2021-10-28 MED ORDER — ACETAMINOPHEN 160 MG/5ML PO SOLN
650.0000 mg | ORAL | Status: DC | PRN
  Administered 2021-10-29 – 2021-11-22 (×6): 650 mg
  Filled 2021-10-28 (×6): qty 20.3

## 2021-10-28 MED ORDER — SODIUM CHLORIDE 0.9% IV SOLUTION: Freq: Once | INTRAVENOUS | Status: AC

## 2021-10-28 MED ORDER — LIDOCAINE HCL (PF) 1 % IJ SOLN
INTRAMUSCULAR | Status: AC
  Filled 2021-10-28: qty 30

## 2021-10-28 MED ORDER — BUSPIRONE HCL 10 MG PO TABS: 30.0000 mg | ORAL_TABLET | Freq: Three times a day (TID) | ORAL | Status: AC | PRN

## 2021-10-28 MED ORDER — EPINEPHRINE 1 MG/10ML IJ SOSY
0.5000 mg | PREFILLED_SYRINGE | Freq: Once | INTRAMUSCULAR | Status: AC
Start: 2021-10-28 — End: 2021-10-28
  Administered 2021-10-28: 0.5 mg via INTRAVENOUS

## 2021-10-28 MED ORDER — NOREPINEPHRINE 4 MG/250ML-% IV SOLN
0.0000 ug/min | INTRAVENOUS | Status: DC
  Administered 2021-10-28: 3 ug/min via INTRAVENOUS
  Administered 2021-10-28: 5 ug/min via INTRAVENOUS
  Administered 2021-10-29: 19 ug/min via INTRAVENOUS
  Administered 2021-10-29: 20 ug/min via INTRAVENOUS
  Administered 2021-10-29: 8 ug/min via INTRAVENOUS
  Administered 2021-10-29: 10 ug/min via INTRAVENOUS
  Administered 2021-10-30: 30 ug/min via INTRAVENOUS
  Administered 2021-10-30: 24 ug/min via INTRAVENOUS
  Filled 2021-10-28 (×7): qty 250

## 2021-10-28 MED ORDER — SODIUM BICARBONATE 8.4 % IV SOLN
INTRAVENOUS | Status: AC | PRN
  Administered 2021-10-28 (×2): 100 meq via INTRAVENOUS

## 2021-10-28 MED ORDER — IPRATROPIUM-ALBUTEROL 0.5-2.5 (3) MG/3ML IN SOLN
RESPIRATORY_TRACT | Status: AC
  Administered 2021-10-28: 3 mL via RESPIRATORY_TRACT
  Filled 2021-10-28: qty 3

## 2021-10-28 MED ORDER — SODIUM CHLORIDE 0.9 % IV SOLN
2.0000 g | Freq: Once | INTRAVENOUS | Status: AC
  Administered 2021-10-28: 2 g via INTRAVENOUS
  Filled 2021-10-28: qty 2

## 2021-10-28 MED ORDER — IOHEXOL 350 MG/ML SOLN
80.0000 mL | Freq: Once | INTRAVENOUS | Status: AC | PRN
  Administered 2021-10-28: 80 mL via INTRAVENOUS

## 2021-10-28 MED ORDER — FENTANYL CITRATE PF 50 MCG/ML IJ SOSY: 50.0000 ug | PREFILLED_SYRINGE | Freq: Once | INTRAMUSCULAR | Status: AC

## 2021-10-28 MED ORDER — VITAMIN K1 10 MG/ML IJ SOLN
10.0000 mg | Freq: Once | INTRAVENOUS | Status: AC
  Administered 2021-10-28: 10 mg via INTRAVENOUS
  Filled 2021-10-28: qty 1

## 2021-10-28 MED ORDER — INSULIN ASPART 100 UNIT/ML IJ SOLN
0.0000 [IU] | INTRAMUSCULAR | Status: DC
  Administered 2021-10-28: 5 [IU] via SUBCUTANEOUS

## 2021-10-28 MED ORDER — VANCOMYCIN HCL 1500 MG/300ML IV SOLN
1500.0000 mg | Freq: Once | INTRAVENOUS | Status: DC
Start: 2021-10-28 — End: 2021-10-28
  Filled 2021-10-28: qty 300

## 2021-10-28 MED ORDER — EPINEPHRINE HCL 5 MG/250ML IV SOLN IN NS
0.5000 ug/min | INTRAVENOUS | Status: DC
  Administered 2021-10-28: 20 ug/min via INTRAVENOUS
  Administered 2021-10-28: 13 ug/min via INTRAVENOUS
  Administered 2021-10-29: 4 ug/min via INTRAVENOUS
  Administered 2021-10-29 (×3): 14 ug/min via INTRAVENOUS
  Administered 2021-10-30: 10 ug/min via INTRAVENOUS
  Administered 2021-10-30: 20 ug/min via INTRAVENOUS
  Administered 2021-10-31: 12 ug/min via INTRAVENOUS
  Administered 2021-10-31: 7 ug/min via INTRAVENOUS
  Administered 2021-11-01: 6 ug/min via INTRAVENOUS
  Administered 2021-11-01: 10 ug/min via INTRAVENOUS
  Administered 2021-11-02: 6 ug/min via INTRAVENOUS
  Filled 2021-10-28 (×12): qty 250

## 2021-10-28 MED ORDER — MAGNESIUM SULFATE 2 GM/50ML IV SOLN: 2.0000 g | Freq: Once | INTRAVENOUS | Status: DC | PRN

## 2021-10-28 MED ORDER — IPRATROPIUM-ALBUTEROL 0.5-2.5 (3) MG/3ML IN SOLN: 3.0000 mL | Freq: Once | RESPIRATORY_TRACT | Status: AC

## 2021-10-28 MED ORDER — FENTANYL CITRATE PF 50 MCG/ML IJ SOSY
PREFILLED_SYRINGE | INTRAMUSCULAR | Status: AC
  Administered 2021-10-28: 50 ug via INTRAVENOUS
  Filled 2021-10-28: qty 2

## 2021-10-28 NOTE — Procedures (Signed)
Arterial Catheter Insertion Procedure Note  VYLA PINT  [id number]  [birth year]  Date:10/28/21  Time:3:29 PM    Provider Performing: Esperanza Sheets T    Procedure: Insertion of Arterial Line [phone]) without US guidance  Indication(s) Blood pressure monitoring and/or need for frequent ABGs  Consent Unable to obtain consent due to emergent nature of procedure.  Anesthesia None   Time Out Verified patient identification, verified procedure, site/side was marked, verified correct patient position, special equipment/implants available, medications/allergies/relevant history reviewed, required imaging and test results available.   Sterile Technique Maximal sterile technique including full sterile barrier drape, hand hygiene, sterile gown, sterile gloves, mask, hair covering, sterile ultrasound probe cover (if used).   Procedure Description Area of catheter insertion was cleaned with chlorhexidine and draped in sterile fashion. Without real-time ultrasound guidance an arterial catheter was placed into the left radial artery.  Appropriate arterial tracings confirmed on monitor.     Complications/Tolerance None; patient tolerated the procedure well.   EBL Minimal   Specimen(s) None

## 2021-10-28 NOTE — Progress Notes (Signed)
Chaplain called to ED Consultation Room A for support of family.  Introduced the services of spiritual care department to family.  Made several visits back the [facility] and provided support to family and gave comfort to their expressions of shock and worry. Provided prayer for their loved one.  Two adult daughter and grand children arrived and Chaplain provided emotional and spiritual support.  Present when MD staff arrived and gave update on patient.  Will remain  available as needed.  Rev. Lanetta Inch     10/28/21 1500  Clinical Encounter Type  Visited With Family;Patient  Visit Type Initial;Psychological support;Spiritual support;Social support;ED  Referral From Nurse  Consult/Referral To Chaplain  Spiritual Encounters  Spiritual Needs Sacred text;Prayer;Emotional  Stress Factors  Patient Stress Factors None identified  Family Stress Factors Family relationships;Financial concerns;Health changes;Loss;Loss of control;Major life changes

## 2021-10-28 NOTE — ED Triage Notes (Signed)
BIB [facility] CPR in progress. Witnessed syncopal episode x2 PT EMS arrival. Pt suffered fall. EMS did CPR 20 minutes and achieved ROSC.

## 2021-10-28 NOTE — ED Provider Notes (Signed)
[facility] EMERGENCY DEPARTMENT Provider Note   CSN: [id number] Arrival date & time: 10/28/21  1247     History  Chief Complaint  Patient presents with   Cardiac Arrest    [name] is a 71 y.o. female.  The history is provided by the patient, medical records and a relative. The history is limited by the condition of the patient. No language interpreter was used.  Cardiac Arrest Witnessed by:  Family member (ems) Incident location:  En route to the ED Time since incident:  30 hours Initial cardiac rhythm per EMS:  Normal sinus Treatments prior to arrival:  AED discharged and vascular access (IO in left leg) Number of shocks delivered:  2 Defibrillation successful: yes   Rhythm after defibrillation:  Junctional rhythm IV access type:  Intraosseous Airway:  Intubation in ED (king in the field) Rhythm on admission to ED:  Junctional     Home Medications Prior to Admission medications   Medication Sig Start Date End Date Taking? Authorizing Provider  albuterol (PROVENTIL HFA;VENTOLIN HFA) 108 (90 BASE) MCG/ACT inhaler Inhale 1-2 puffs into the lungs every 4 (four) hours as needed for wheezing or shortness of breath.     [provider]  Alcohol Swabs (B-D SINGLE USE SWABS REGULAR) PADS Apply topically. 03/08/21   [provider]  ALPRAZolam Duanne Moron) 0.25 MG tablet Take 0.25 mg by mouth daily as needed for anxiety. 02/19/19   [provider]  baclofen (LIORESAL) 10 MG tablet TAKE 1 TABLET(10 MG) BY MOUTH TWICE DAILY AS NEEDED FOR MUSCLE SPASMS 06/15/21   Bo Merino, MD  BD PEN NEEDLE NANO 2ND GEN 32G X 4 MM MISC USE DAILY WITH INSULIN PEN ONCE A DAY 09/24/20   [provider]  betamethasone dipropionate (DIPROLENE) 0.05 % ointment 1 application to affected area 03/15/15   [provider]  Blood Glucose Calibration (TRUE METRIX LEVEL 1) Low SOLN  12/01/20   [provider]  Blood Glucose Monitoring  Suppl (TRUE METRIX METER) w/Device KIT  12/01/20   [provider]  cephALEXin (KEFLEX) 500 MG capsule Take 1 capsule (500 mg total) by mouth 3 (three) times daily. 09/14/21   Trula Slade, DPM  Cholecalciferol (VITAMIN D) 50 MCG (2000 UT) CAPS Take by mouth every other day.    [provider]  diclofenac Sodium (VOLTAREN) 1 % GEL Apply 2 g topically 4 (four) times daily. Rub into affected area of foot 2 to 4 times daily Patient not taking: Reported on 06/12/2021 01/24/20   Trula Slade, DPM  diphenhydrAMINE HCl (BENADRYL ALLERGY PO)     [provider]  Elastic Bandages & Supports (MEDICAL COMPRESSION STOCKINGS) [place] 1 each by Does not apply route every 6 (six) hours as needed (swelling). 04/28/19   Wieters, Hallie C, PA-C  fluticasone (FLONASE) 50 MCG/ACT nasal spray 2 sprays 08/07/10   [provider]  gabapentin (NEURONTIN) 300 MG capsule Take 1 capsule by mouth 2 (two) times daily.    [provider]  glucose blood test strip See admin instructions. 04/05/13   [provider]  hydrochlorothiazide (HYDRODIURIL) 12.5 MG tablet Take 12.5 mg by mouth daily. 02/10/19   [provider]  Insulin Pen Needle (BD PEN NEEDLE NANO U/F) 32G X 4 MM MISC to use daily with insulin pen    [provider]  LANTUS SOLOSTAR 100 UNIT/ML Solostar Pen Inject 22 Units into the skin at bedtime. 12/09/18   [provider]  Molnupiravir 200 MG CAPS TAKE 4 CAPSULES BY MOUTH 2 TIMES DAILY FOR 5 DAYS Patient not taking: Reported on 06/12/2021 11/21/20 11/21/21  Orma Render, NP  OneTouch Delica Lancets 62H MISC Use to check blood sugar once a day for Diabetes; DX Code: E11.69 04/04/20   [provider]  pantoprazole (PROTONIX) 40 MG tablet Take 1 tablet by mouth 2 (two) times daily.    [provider]  predniSONE (DELTASONE) 20 MG tablet Take 40 mg by mouth daily. Patient not taking: Reported on 06/12/2021 05/16/21   [provider]  repaglinide (PRANDIN) 2 MG tablet Take 2 mg by mouth 2 (two) times daily before a meal.    [provider]  simvastatin (ZOCOR) 40 MG tablet Take 1 tablet by mouth daily.    [provider]  temazepam (RESTORIL) 15 MG capsule TAKE 1 CAPSULE(15 MG) BY MOUTH AT BEDTIME AS NEEDED 10/15/21   Ofilia Neas, PA-C      Allergies    Aspirin, Benadryl [diphenhydramine], Darvon [propoxyphene], Hydrocodone bit-homatrop mbr, Metformin, Other, Pentazocine, Percocet [oxycodone-acetaminophen], Sulfa antibiotics, Tetracaine hcl, Tetracyclines & related, and Tramadol    Review of Systems   Review of Systems  Unable to perform ROS: Patient unresponsive   Physical Exam Updated Vital Signs BP (!) 151/82    Pulse (!) 43    Resp 18    Wt 77.1 kg    SpO2 93%    BMI 28.29 kg/m  Physical Exam Vitals and nursing note reviewed.  Constitutional:      General: She is in acute distress.     Appearance: She is ill-appearing.     Interventions: She is intubated.  HENT:     Nose: No congestion or rhinorrhea.     Mouth/Throat:     Mouth: Mucous membranes are moist.     Pharynx: No oropharyngeal exudate or posterior oropharyngeal erythema.  Eyes:     General: No scleral icterus.    Extraocular Movements:     Right eye: Abnormal extraocular motion present.     Left eye: Abnormal extraocular motion present.     Comments: Pupils are sluggish but reactive with a right gaze preference on arrival.  No point of threat  Cardiovascular:     Rate and Rhythm: Normal rate. Rhythm irregular.     Comments: Patient has intermittent bradycardia and normal rate with concern for junctional rhythm and sinus Pulmonary:     Effort: Respiratory distress present. She is intubated.     Breath sounds: No wheezing, rhonchi or rales.  Chest:     Chest wall: No tenderness.  Abdominal:     Tenderness: There is no abdominal tenderness.  Musculoskeletal:        General: No tenderness.     Cervical  back: No tenderness.  Skin:    Findings: No erythema.  Neurological:     Mental Status: She is unresponsive.     GCS: GCS eye subscore is 1. GCS verbal subscore is 1. GCS motor subscore is 1.    ED Results / Procedures / Treatments   Labs (all labs ordered are listed, but only abnormal results are displayed) Labs Reviewed  CBC WITH DIFFERENTIAL/PLATELET - Abnormal; Notable for the following components:      Result Value   WBC 3.3 (*)    RBC 2.02 (*)    Hemoglobin 5.5 (*)    HCT 19.8 (*)    MCHC 27.8 (*)    Platelets 13 (*)  Neutro Abs 0.6 (*)    All other components within normal limits  I-STAT CHEM 8, ED - Abnormal; Notable for the following components:   Sodium 148 (*)    Potassium 2.9 (*)    Chloride 119 (*)    Creatinine, Ser 1.10 (*)    Glucose, Bld 185 (*)    Calcium, Ion 0.83 (*)    TCO2 13 (*)    Hemoglobin 8.8 (*)    HCT 26.0 (*)    All other components within normal limits  I-STAT CHEM 8, ED - Abnormal; Notable for the following components:   Sodium 149 (*)    Potassium 3.0 (*)    Chloride 120 (*)    Creatinine, Ser 1.10 (*)    Glucose, Bld 187 (*)    Calcium, Ion 0.83 (*)    TCO2 13 (*)    Hemoglobin 8.8 (*)    HCT 26.0 (*)    All other components within normal limits  I-STAT ARTERIAL BLOOD GAS, ED - Abnormal; Notable for the following components:   pH, Arterial 6.921 (*)    pCO2 arterial 61.4 (*)    pO2, Arterial 340 (*)    Bicarbonate 12.6 (*)    TCO2 14 (*)    Acid-base deficit 20.0 (*)    Calcium, Ion 1.69 (*)    HCT 32.0 (*)    Hemoglobin 10.9 (*)    All other components within normal limits  I-STAT ARTERIAL BLOOD GAS, ED - Abnormal; Notable for the following components:   pH, Arterial 7.151 (*)    pCO2 arterial 61.6 (*)    pO2, Arterial 377 (*)    Acid-base deficit 7.0 (*)    Calcium, Ion 1.55 (*)    HCT 27.0 (*)    Hemoglobin 9.2 (*)    All other components within normal limits  RESP PANEL BY RT-PCR (FLU A&B, COVID) ARPGX2  CULTURE,  BLOOD (ROUTINE X 2)  CULTURE, BLOOD (ROUTINE X 2)  LACTIC ACID, PLASMA  LACTIC ACID, PLASMA  RAPID URINE DRUG SCREEN, HOSP PERFORMED  URINALYSIS, ROUTINE W REFLEX MICROSCOPIC  PROTIME-INR  BLOOD GAS, ARTERIAL  BLOOD GAS, ARTERIAL  BLOOD GAS, ARTERIAL  OCCULT BLOOD X 1 CARD TO LAB, STOOL  HEMOGLOBIN A1C  PROCALCITONIN  BASIC METABOLIC PANEL  MAGNESIUM  PATHOLOGIST SMEAR REVIEW  TYPE AND SCREEN  PREPARE PLATELET PHERESIS  ABO/RH  TROPONIN I (HIGH SENSITIVITY)  TROPONIN I (HIGH SENSITIVITY)    EKG EKG Interpretation  Date/Time:  Sunday October 28 2021 12:55:10 EST Ventricular Rate:  36 PR Interval:    QRS Duration: 106 QT Interval:  400 QTC Calculation: 310 R Axis:   156 Text Interpretation: Atrial fibrillation Ventricular premature complex Probable right ventricular hypertrophy Inferior infarct, acute (RCA) Borderline ST elevation, anterior leads Probable RV involvement, suggest recording right precordial leads >>> Acute MI <<< ** ** ACUTE MI / STEMI ** ** Confirmed by Antony Blackbird [phone]) on 10/28/2021 1:14:19 PM  Radiology DG CHEST PORT 1 VIEW  Result Date: 10/28/2021 CLINICAL DATA:  Post CPR.  Central line placement. EXAM: PORTABLE CHEST 1 VIEW COMPARISON:  10/28/2021 at 1:15 p.m. FINDINGS: Right internal jugular central venous line has its tip in the right atrium. No pneumothorax. No other change from the earlier study. IMPRESSION: 1. Right IJ central venous line has its tip in the right atrium. No pneumothorax. Electronically Signed   By: Lajean Manes M.D.   On: 10/28/2021 14:52   DG Chest Portable 1 View  Result Date: 10/28/2021 CLINICAL  DATA:  Status post cardiac arrest and intubation. EXAM: PORTABLE CHEST 1 VIEW COMPARISON:  10/23/2020. FINDINGS: Endotracheal tube tip projects 1 cm above the carinal. Nasal/orogastric tube passes well below the diaphragm into the stomach. Cardiac silhouette normal in size. Normal mediastinal and hilar contours. Clear lungs. IMPRESSION:  1. Well-positioned endotracheal tube and nasal/orogastric tube. 2. No acute cardiopulmonary disease. Electronically Signed   By: Lajean Manes M.D.   On: 10/28/2021 13:39   DG Abd Portable 1 View  Result Date: 10/28/2021 CLINICAL DATA:  Nasogastric tube placement. EXAM: PORTABLE ABDOMEN - 1 VIEW COMPARISON:  None. FINDINGS: Nasogastric tube extends well below diaphragm to curl within the to stomach in the left upper quadrant. IMPRESSION: Well-positioned nasogastric tube. Electronically Signed   By: Lajean Manes M.D.   On: 10/28/2021 13:39    Procedures Procedures     CRITICAL CARE Performed by: Gwenyth Allegra Shloime Keilman Total critical care time: 60 minutes Critical care time was exclusive of separately billable procedures and treating other patients. Critical care was necessary to treat or prevent imminent or life-threatening deterioration. Critical care was time spent personally by me on the following activities: development of treatment plan with patient and/or surrogate as well as nursing, discussions with consultants, evaluation of patient's response to treatment, examination of patient, obtaining history from patient or surrogate, ordering and performing treatments and interventions, ordering and review of laboratory studies, ordering and review of radiographic studies, pulse oximetry and re-evaluation of patient's condition.  Medications Ordered in ED Medications  norepinephrine (LEVOPHED) 81m in 2563m(0.016 mg/mL) premix infusion (20 mcg/min Intravenous Rate/Dose Change 10/28/21 1408)  0.9 %  sodium chloride infusion (has no administration in time range)  magnesium sulfate IVPB 2 g 50 mL (has no administration in time range)  fentaNYL (SUBLIMAZE) bolus via infusion 25 mcg (has no administration in time range)  0.9 %  sodium chloride infusion (Manually program via Guardrails IV Fluids) (has no administration in time range)  propofol (DIPRIVAN) 1000 MG/100ML infusion (has no administration  in time range)  docusate (COLACE) 50 MG/5ML liquid 100 mg (has no administration in time range)  polyethylene glycol (MIRALAX / GLYCOLAX) packet 17 g (has no administration in time range)  ondansetron (ZOFRAN) injection 4 mg (has no administration in time range)  acetaminophen (TYLENOL) tablet 650 mg (has no administration in time range)    Or  acetaminophen (TYLENOL) 160 MG/5ML solution 650 mg (has no administration in time range)    Or  acetaminophen (TYLENOL) suppository 650 mg (has no administration in time range)  busPIRone (BUSPAR) tablet 30 mg (has no administration in time range)    Or  busPIRone (BUSPAR) tablet 30 mg (has no administration in time range)  0.9 %  sodium chloride infusion (has no administration in time range)  pantoprazole (PROTONIX) injection 40 mg (has no administration in time range)  fentaNYL (SUBLIMAZE) injection 25 mcg (has no administration in time range)  fentaNYL 250067min NS 250m87m0mc72m) infusion-PREMIX (25 mcg/hr Intravenous New Bag/Given 10/28/21 1443)  fentaNYL (SUBLIMAZE) bolus via infusion 25-100 mcg (has no administration in time range)  EPINEPHrine (ADRENALIN) 5 mg in NS 250 mL (0.02 mg/mL) premix infusion (20 mcg/min Intravenous New Bag/Given 10/28/21 1412)  docusate sodium (COLACE) capsule 100 mg (has no administration in time range)  ceFEPIme (MAXIPIME) 2 g in sodium chloride 0.9 % 100 mL IVPB (has no administration in time range)  vancomycin (VANCOREADY) IVPB 1500 mg/300 mL (has no administration in time range)  vancomycin variable dose per unstable  renal function (pharmacist dosing) (has no administration in time range)  insulin aspart (novoLOG) injection 0-6 Units (has no administration in time range)  0.9 %  sodium chloride infusion (Manually program via Guardrails IV Fluids) (has no administration in time range)  arformoterol (BROVANA) nebulizer solution 15 mcg (has no administration in time range)  budesonide (PULMICORT) nebulizer solution  0.5 mg (has no administration in time range)  potassium chloride 10 mEq in 50 mL *CENTRAL LINE* IVPB (has no administration in time range)  EPINEPHrine (ADRENALIN) 1 MG/10ML injection (1 mg Intravenous Given 10/28/21 1257)  sodium chloride 0.9 % bolus 1,000 mL (1,000 mLs Intravenous New Bag/Given 10/28/21 1323)  LORazepam (ATIVAN) injection 2 mg (2 mg Intravenous Given 10/28/21 1254)  calcium chloride injection (1 g Intravenous Given 10/28/21 1330)  calcium gluconate 2 g/ 100 mL sodium chloride IVPB (2,000 mg Intravenous New Bag/Given 10/28/21 1354)  EPINEPHrine (ADRENALIN) 1 MG/10ML injection (1 mg Intravenous Given 10/28/21 1328)  sodium bicarbonate injection (100 mEq Intravenous Given 10/28/21 1330)  fentaNYL (SUBLIMAZE) injection 50 mcg (50 mcg Intravenous Given 10/28/21 1345)  EPINEPHrine (ADRENALIN) 1 MG/10ML injection 0.5 mg (0.5 mg Intravenous Given 10/28/21 1406)  ipratropium-albuterol (DUONEB) 0.5-2.5 (3) MG/3ML nebulizer solution 3 mL (3 mLs Nebulization Given by Other 10/28/21 1438)    ED Course/ Medical Decision Making/ A&P                           Medical Decision Making  MELLISSA CONLEY is a 71 y.o. female with a past medical history significant for fibromyalgia, hypertension, hypercholesterolemia, diabetes, CKD, GERD, and asthma who presents for cardiac arrest.  According to EMS and confirmed by family later, patient had a witnessed syncopal episode with family in the bathroom where she was guided to the ground.  She was unresponsive for several seconds then sat back up and then grabbed her chest and then passed back out.  According to EMS, patient was found to have minimal responsiveness and then went into cardiac arrest after loading to the truck.  Patient received approximately 20 minutes of CPR with EMS and received a total of 2 shocks for V. fib, 3 doses of epinephrine, and amiodarone.  Patient also received fluids through an IO.  Patient had a [facility] airway placed and they obtained ROSC as she  was arriving to the emergency department.  Upon arrival to the ED bay, patient was still unresponsive.  She was having some eye and facial twitching and her pupils were sluggish and her gaze preference was to the right.  No blink to threat.  Patient had the [facility] airway in place and had clear breath sounds that were symmetric.  She did not respond to any pain and GCS was 3.   With her evidence of seizures on her face and the gaze preference, I was initially concerned about possible status versus seizure versus intracranial hemorrhage.  Patient had some Ativan ordered to help terminate what could be seizure.  While awaiting initial IV placement and blood collection, patient started to have bradycardia.  Patient then lost pulses and received another 4 minutes of CPR.  She was given epinephrine and ROSC was obtained.  After CPR, EKG was collected and showed evidence of STEMI.  Code STEMI was called and critical care was also called.  Patient subsequently intubated by respiratory without difficulty and portable chest x-ray was reviewed by me at the bedside showing adequate placement.  OG tube was also placed and  she had some blood return from the OG tube.  Cardiology came to bedside and helps run another code on her the last several minutes.  Her lab started to return and she was found to be acidotic and she was given bicarb.  She was also given magnesium and calcium.  Cardiology fellow on reassessment after ROSC that this was not entirely cardiac and feels the critical care would be better to manage.  Critical care arrived and continued to resuscitate.  Patient had a line placed by respiratory and critical care placing central line.  She is still getting pressors and after she was found to be both anemic and thrombocytopenic, she was given blood and platelets.  Critical care will admit for further management of cardiac arrest of still undetermined etiology.        Final Clinical Impression(s) /  ED Diagnoses Final diagnoses:  Cardiac arrest ([place])     Clinical Impression: 1. Cardiac arrest ([place])   2. Respiratory failure ([place])   3. Encounter for central line placement     Disposition: Admit  This note was prepared with assistance of Dragon voice recognition software. Occasional wrong-word or sound-a-like substitutions may have occurred due to the inherent limitations of voice recognition software.       Miyako Oelke, Gwenyth Allegra, MD 10/28/21 [phone]

## 2021-10-28 NOTE — Progress Notes (Addendum)
ANTICOAGULATION CONSULT NOTE - Initial Consult  Pharmacy Consult for heparin Indication: pulmonary embolus  Allergies  Allergen Reactions   Aspirin Nausea And Vomiting    Other reaction(s): Unknown   Benadryl [Diphenhydramine]     Can only take dye free. States the dye causes itching.   Darvon [Propoxyphene] Itching    Can only during the day. Night time makes her itch   Hydrocodone Bit-Homatrop Mbr Itching   Metformin     Other reaction(s): GI   Other     Other reaction(s): Unknown. States reaction was to nasal spray that caused pupils to shrink   Pentazocine     nervous Other reaction(s): jitters   Percocet [Oxycodone-Acetaminophen]     itching   Sulfa Antibiotics Hives   Tetracaine Hcl     Other reaction(s): Unknown. Thinks caused itching.   Tetracyclines & Related Hives   Tramadol     Other reaction(s): itch    Patient Measurements: Height: 5' 5"  (165.1 cm) Weight: 77.1 kg (169 lb 15.6 oz) IBW/kg (Calculated) : 57  Vital Signs: Temp: 95.7 F (35.4 C) (01/01 1805) Temp Source: Bladder (01/01 1730) BP: 114/62 (01/01 1735) Pulse Rate: 108 (01/01 1805)  Labs: Recent Labs    10/28/21 1310 10/28/21 1311 10/28/21 1324 10/28/21 1334 10/28/21 1554 10/28/21 1710 10/28/21 1723 10/28/21 1751  HGB 5.5* 8.8* 8.8*   < >  --  11.9* 11.5* 11.9*  HCT 19.8* 26.0* 26.0*   < >  --  37.5 36.2 35.0*  PLT 13*  --   --   --   --  197 197  --   LABPROT  --   --   --   --  32.1*  --   --   --   INR  --   --   --   --  3.1*  --   --   --   CREATININE  --  1.10* 1.10*  --   --   --   --   --   TROPONINIHS  --   --   --   --  1,525* 3,245*  --   --    < > = values in this interval not displayed.    Estimated Creatinine Clearance: 47.4 mL/min (A) (by C-G formula based on SCr of 1.1 mg/dL (H)).   Medical History: Past Medical History:  Diagnosis Date   Anxiety    Arthritis    Asthma    Depression    Diabetes mellitus without complication (HCC)    Fibromyalgia    GERD  (gastroesophageal reflux disease)    Hyperlipemia    Hypertension    PONV (postoperative nausea and vomiting)     Medications:  Medications Prior to Admission  Medication Sig Dispense Refill Last Dose   albuterol (PROVENTIL HFA;VENTOLIN HFA) 108 (90 BASE) MCG/ACT inhaler Inhale 1-2 puffs into the lungs every 4 (four) hours as needed for wheezing or shortness of breath.       Alcohol Swabs (B-D SINGLE USE SWABS REGULAR) PADS Apply topically.      ALPRAZolam (XANAX) 0.25 MG tablet Take 0.25 mg by mouth daily as needed for anxiety.      baclofen (LIORESAL) 10 MG tablet TAKE 1 TABLET(10 MG) BY MOUTH TWICE DAILY AS NEEDED FOR MUSCLE SPASMS 60 tablet 2    BD PEN NEEDLE NANO 2ND GEN 32G X 4 MM MISC USE DAILY WITH INSULIN PEN ONCE A DAY      betamethasone dipropionate (DIPROLENE) 0.05 %  ointment 1 application to affected area      Blood Glucose Calibration (TRUE METRIX LEVEL 1) Low SOLN       Blood Glucose Monitoring Suppl (TRUE METRIX METER) w/Device KIT       cephALEXin (KEFLEX) 500 MG capsule Take 1 capsule (500 mg total) by mouth 3 (three) times daily. 21 capsule 0    Cholecalciferol (VITAMIN D) 50 MCG (2000 UT) CAPS Take by mouth every other day.      diclofenac Sodium (VOLTAREN) 1 % GEL Apply 2 g topically 4 (four) times daily. Rub into affected area of foot 2 to 4 times daily (Patient not taking: Reported on 06/12/2021) 100 g 2    diphenhydrAMINE HCl (BENADRYL ALLERGY PO)       Elastic Bandages & Supports (MEDICAL COMPRESSION STOCKINGS) MISC 1 each by Does not apply route every 6 (six) hours as needed (swelling). 2 each 0    fluticasone (FLONASE) 50 MCG/ACT nasal spray 2 sprays      gabapentin (NEURONTIN) 300 MG capsule Take 1 capsule by mouth 2 (two) times daily.      glucose blood test strip See admin instructions.      hydrochlorothiazide (HYDRODIURIL) 12.5 MG tablet Take 12.5 mg by mouth daily.      Insulin Pen Needle (BD PEN NEEDLE NANO U/F) 32G X 4 MM MISC to use daily with insulin pen       LANTUS SOLOSTAR 100 UNIT/ML Solostar Pen Inject 22 Units into the skin at bedtime.      Molnupiravir 200 MG CAPS TAKE 4 CAPSULES BY MOUTH 2 TIMES DAILY FOR 5 DAYS (Patient not taking: Reported on 06/12/2021) 40 capsule 0    OneTouch Delica Lancets 96P MISC Use to check blood sugar once a day for Diabetes; DX Code: E11.69      pantoprazole (PROTONIX) 40 MG tablet Take 1 tablet by mouth 2 (two) times daily.      predniSONE (DELTASONE) 20 MG tablet Take 40 mg by mouth daily. (Patient not taking: Reported on 06/12/2021)      repaglinide (PRANDIN) 2 MG tablet Take 2 mg by mouth 2 (two) times daily before a meal.      simvastatin (ZOCOR) 40 MG tablet Take 1 tablet by mouth daily.      temazepam (RESTORIL) 15 MG capsule TAKE 1 CAPSULE(15 MG) BY MOUTH AT BEDTIME AS NEEDED 30 capsule 0    Scheduled:   sodium chloride   Intravenous Once   sodium chloride   Intravenous Once   arformoterol  15 mcg Nebulization BID   budesonide (PULMICORT) nebulizer solution  0.5 mg Nebulization BID   docusate  100 mg Per Tube BID   fentaNYL (SUBLIMAZE) injection  25 mcg Intravenous Once   insulin aspart  0-6 Units Subcutaneous Q4H   pantoprazole (PROTONIX) IV  40 mg Intravenous QHS   polyethylene glycol  17 g Per Tube Daily   vancomycin variable dose per unstable renal function (pharmacist dosing)   Does not apply See admin instructions    Assessment: 71 yo female with saddle PE to start heparin. She is noted with a hematoma around RIJ and blood has been noted from the NG tube. Case discussed with Dr. Carlis Abbott and to start heparin with no bolus -INR= 3.1 (vitamin K 95m IV ordered) -hg= 11.0  Goal of Therapy:  Heparin level 0.3-0.5 units/ml Monitor platelets by anticoagulation protocol: Yes   Plan:  -start heparin at 800 units/hr -heparin level in 8 hours -CBC with the next  heparin level  Hildred Laser, PharmD Clinical Pharmacist **Pharmacist phone directory can now be found on [email or web address] ([email or web address]).  Listed  under [place].  Addendum -blood from rectum noted per RN other bleeding remain stable (oozing from RIJ, some bleed from NG tube) -heparin has not yet started  Plan -Start heparin at 650 units/hr -heparin level in 6 hrs  Hildred Laser, PharmD Clinical Pharmacist **Pharmacist phone directory can now be found on [email or web address] ([email or web address]).  Listed under [place].

## 2021-10-28 NOTE — Code Documentation (Addendum)
Pulse check- pulse palpated- ROSC achieved

## 2021-10-28 NOTE — Progress Notes (Signed)
°  Echocardiogram 2D Echocardiogram has been performed.  [name] F 10/28/2021, 3:24 PM

## 2021-10-28 NOTE — Care Plan (Signed)
STEMI activation note:  Patient experienced cardiac arrest at home with shockable rhythm.  Has arrest multiple times.  K 2.9.  Patient experienced bradycardic arrest requiring CPR again.  Initial ABG c/w profound respiratory and metabolic acidosis with pH 6.9/61/340/13.  Given the total code time ~90min and profound acidosis, the patient has very poor cardiac and neurologic prognosis.  Discussed with ED attending, will defer cardiac catheterization at this time.

## 2021-10-28 NOTE — Progress Notes (Signed)
vLTM ordered  all imp below 10kohms [facility] to monitor  Pt event button tested

## 2021-10-28 NOTE — Progress Notes (Signed)
Pt transported to and from CT scan on the ventilator. 

## 2021-10-28 NOTE — Code Documentation (Addendum)
Pulse check: sinus brady-ROSC achieved

## 2021-10-28 NOTE — Procedures (Signed)
Intubation Procedure Note  [name]  [id number]  June 29, 1951  Date:10/28/21  Time:3:29 PM   Provider Performing:Rajanae Mantia, Otho Ket T    Procedure: Intubation (61224)  Indication(s) Respiratory Failure  Consent Unable to obtain consent due to emergent nature of procedure.   Anesthesia None   Time Out Verified patient identification, verified procedure, site/side was marked, verified correct patient position, special equipment/implants available, medications/allergies/relevant history reviewed, required imaging and test results available.   Sterile Technique Usual hand hygeine, masks, and gloves were used   Procedure Description Patient positioned in bed supine.  Sedation given as noted above.  Patient was intubated with endotracheal tube using Glidescope.  View was Grade 1 full glottis .  Number of attempts was 1.  Colorimetric CO2 detector was consistent with tracheal placement.   Complications/Tolerance None; patient tolerated the procedure well. Chest X-ray is ordered to verify placement.   EBL Minimal   Specimen(s) None

## 2021-10-28 NOTE — Progress Notes (Addendum)
eLink Physician-Brief Progress Note Patient Name: [name] DOB: 03-Sep-1951 MRN: [id number]   Date of Service  10/28/2021  HPI/Events of Note  Called to check in with RN about labs. Last run of K had just completed 30 min ago. A CBC , BMP, LA, DIC panel and mag are to be sent soon. She remains on Epinephrine at 13 mic/min and levophed at 3 mic/min. She is also on fentanyl and a bicarb drip. In addition, heparin infusion is to be started based on assessment done by day team after CT was completed.   eICU Interventions  D/w RN and labs are to be sent now Please let us know when resulted She is on 60% fio2      Intervention Category Major Interventions: Respiratory failure - evaluation and management  Anishka Bushard G Cainen Burnham 10/28/2021, 8:31 PM  Addendum at 10:30 pm  Noted CBC BMP LA and DIC panel H/H stable, platelets are ok DIC panel noted, confirms DIC Spoke with RN - no new bleeding. Has had mild oozing from her central line but this is better. Had some NG bleeding earlier, not repeated , did have a bowel movement with some small amount of blood in it but this was not a lot either Remains on same dose of pressor Bicarb is 14  Push 2 amps of bicarb, continue bicarb drip  Very challenging situation at this time  Her DIC is likely from the massive PE that she had, she is also on antibiotics She has not yet been started on a heparin drip - RN will ne starting a lower rate per pharmacy who will be monitoring this as well Case discussed with bedside CCM MD, Dr Carson Myrtle  At this point, without heparin infusion on board and with no overt bleeding at this time, would be dangerous to replete clotting factors as part of routine care Will need to follow CBC closely and assess if significant bleeding occurs from anywhere I changed orders of CBC and will check it every 4 hours instead of every 6 hours for now If there is overt bleeding, will have no choice but to replace clotting factors, fully keeping  in mind that her PE can become worse Next labs to be done at 1 am - CBC BMP LA and ABG  Will repeat a DIC panel in the AM as well  Addendum at 11:55 pm Hyperglycemia Will try one dose of subcutaneous insulin 8 units x 1 Recheck CBG at 2 am If it does not improve , will start insulin drip  Addendum at 2:45 am Notified of labs Worsening AKI, K is 3,3 but Ph and bicarb are improved Stable H/H and platelets Stop SSI, start iV insulin, replete with 20 meq IV Kcl , check 5 am CBC CMP and DIC panel Call us with labs  Addendum at 630 am: Notified of labs 40 meq IV Kcl to be given for the K of 3.1  Shock liver noted, likely also contributing to the high lactic acid levels Heparin being adjusted by pharmacy - no bleeding noted, and H/H and platelets have been stable I ordered repeat labs for 9 am  Will need serial labs today as well DIC panel noted, INR down and otherwise similar parameters - not repleted due to above reasons D/w RN

## 2021-10-28 NOTE — Procedures (Signed)
Central Venous Catheter Insertion Procedure Note  [name]  [id number]  [birth year]  Date:10/28/21  Time:3:16 PM   Provider Performing:Averil Digman Naomie Dean   Procedure: Insertion of Non-tunneled Central Venous Catheter(36556) with US guidance ([id number])   Indication(s) Medication administration  Consent Unable to obtain consent due to emergent nature of procedure.  Anesthesia Topical only with 1% lidocaine   Timeout Verified patient identification, verified procedure, site/side was marked, verified correct patient position, special equipment/implants available, medications/allergies/relevant history reviewed, required imaging and test results available.  Sterile Technique Maximal sterile technique including full sterile barrier drape, hand hygiene, sterile gown, sterile gloves, mask, hair covering, sterile ultrasound probe cover (if used).  Procedure Description Area of catheter insertion was cleaned with chlorhexidine and draped in sterile fashion.  With real-time ultrasound guidance a central venous catheter was placed into the right internal jugular vein. Guidewire was confirmed in the vessel prior to dilation. Nonpulsatile blood flow and easy flushing noted in all ports.  The catheter was sutured in place and sterile dressing applied.  Complications/Tolerance None; patient tolerated the procedure well. Chest X-ray is ordered to verify placement for internal jugular or subclavian cannulation.   Chest x-ray is not ordered for femoral cannulation.  EBL Minimal  Specimen(s) None  Julian Hy, DO 10/28/21 3:17 PM [facility]

## 2021-10-28 NOTE — Code Documentation (Signed)
No Pulse. CPR began.

## 2021-10-28 NOTE — ED Notes (Signed)
Went to bring back family when pt was stable. Family left hospital.

## 2021-10-28 NOTE — H&P (Signed)
NAME:  [name], MRN:  [id number], DOB:  December 24, 1950, LOS: 0 ADMISSION DATE:  10/28/2021, CONSULTATION DATE: 10/28/21 REFERRING MD: Dr. Sherry Ruffing, CHIEF COMPLAINT:  Cardiac Arrest, VF   History of Present Illness:  [name] who presented to [facility] ER on 1/1 post VF cardiac arrest.   Family reports she was in her usual state of health until time of presentation.  The patient was reportedly at home when she went to the restroom.  She became lightheaded and was able to sit down but then lost consciousness.  EMS was called.  She was found to be in VF, required 2 shock, 3 epi and 200 mg amiodarone. The patient received 20 minutes of CPR with EMS before ROSC.  She was intubated with a King airway.  On arrival to the ER, she arrested again and required 4 more minutes of CPR before ROSC.  Initial labs notable for ph 6.9 / 61 / 340 / 12.6, Na 148, K 2.8, cl 119, glucose 185, BUN 16, Sr CR 1.10, ionized calcium 0.83, WBC 3.3, Hgb 5.5 and platelets 13.  Initial CXR essentially clear with good ETT placement. There was concern for possible STEMI post ROSC.  She had an additional (3rd) cardiac arrest lasting approximately 2 minutes.  Cardiology was called and felt arrest not to be cardiac in nature.  COVID & influenza screening negative.   PCCM called for ICU admission.   Pertinent  Medical History  Anxiety / Depression  Fibromyalgia  Asthma  DM  GERD  HTN  HLD   Significant Hospital Events: Including procedures, antibiotic start and stop dates in addition to other pertinent events   1/1 Admit post VF arrest   Interim History / Subjective:  As above   Objective   Blood pressure (!) 77/47, pulse 99, resp. rate (!) 23, SpO2 (!) 61 %.       No intake or output data in the 24 hours ending 10/28/21 1323 There were no vitals filed for this visit.  Examination: General: critically ill appearing adult female lying in bed in NAD HENT: ETT, anicteric Lungs: agonal respirations on vent, lungs bilaterally  clear Cardiovascular: s1s2 rrr, distant tones, SB/junctional rhythm on monitor, post epi increased to 90-100's Abdomen: soft, protuberant, bsx4 hypoactive  Extremities: cool/dry, no edema  Neuro: obtunded, no response to verbal/physical stimuli  Resolved Hospital Problem list     Assessment & Plan:   VF Cardiac Arrest with Circulatory Shock  Post ~30 minutes total CPR, 2 shock in field, 300 mg amiodarone, 3 epi.  Repeat arrest in ER x2.  Unclear etiology > infection vs primary cardiac vs anemia.  -admit to ICU  -37 degree temperature management  -correct acidosis, temperature control -tylenol, buspar for shivering  -appreciate Cardiology evaluation  -trend troponin  -assess CT head  -assess ECHO  -assess UDS  -place central access -Aline monitoring  -epi, norepi for MAP >65  -empiric abx with cefepime, vanco for now  -pan culture   Acute Respiratory Failure with Hypoxia & Hypercarbia s/p Cardiac Arrest Asthma  -PRVC 8cc/kg, increased rate for now with acute acidosis  -wean PEEP / FiO2 for sats >90% -follow up ABG PRN with vent changes  -now CXR  -PAD protocol with RASS GOAL -2 for now  -brovana, pulmicort   At Risk AKI  Hypokalemia  -now potassium, magnesium with follow up labs -Trend BMP / urinary output -Replace electrolytes as indicated -Avoid nephrotoxic agents, ensure adequate renal perfusion  Anemia / Pancytopenia  Acute anemia on presentation, unclear source -trend CBC -transfuse emergency release blood, 2 units now  -2 units platelets  -note lab inconsistencies with ISTAT and chemistry, treat as anemia anyway given hemodynamic instability  -FOBT, may need GI evaluation once stabilized   HTN, HLD  -hold home agents with shock > HCTZ, zocor   DM II  -SSI, very sensitive scale for now  -hold home regimen  -glucose goal 140-180   GERD  -PPI   Anxiety, Depression, Fibromyalgia  -hold home agents for now  -hold home baclofen for now   Best Practice  (right click and "Reselect all SmartList Selections" daily)  Diet/type: NPO DVT prophylaxis: SCD GI prophylaxis: PPI Lines: Central line Foley:  N/A Code Status:  full code Last date of multidisciplinary goals of care discussion: extensive discussion with family regarding nature of critical illness.  They understand that she is critically ill and may not survive this admission.  They are open to one more round of CPR if she were to arrest again.  Reviewed all supportive measures in place currently.   Labs   CBC: Recent Labs  Lab 10/28/21 1311  HGB 8.8*  HCT 26.0*    Basic Metabolic Panel: Recent Labs  Lab 10/28/21 1311  NA 148*  K 2.9*  CL 119*  GLUCOSE 185*  BUN 16  CREATININE 1.10*   GFR: CrCl cannot be calculated (Unknown ideal weight.). No results for input(s): PROCALCITON, WBC, LATICACIDVEN in the last 168 hours.  Liver Function Tests: No results for input(s): AST, ALT, ALKPHOS, BILITOT, PROT, ALBUMIN in the last 168 hours. No results for input(s): LIPASE, AMYLASE in the last 168 hours. No results for input(s): AMMONIA in the last 168 hours.  ABG    Component Value Date/Time   TCO2 13 (L) 10/28/2021 1311     Coagulation Profile: No results for input(s): INR, PROTIME in the last 168 hours.  Cardiac Enzymes: No results for input(s): CKTOTAL, CKMB, CKMBINDEX, TROPONINI in the last 168 hours.  HbA1C: No results found for: HGBA1C  CBG: No results for input(s): GLUCAP in the last 168 hours.  Review of Systems:   Unable to complete as patient is altered on vent.   Past Medical History:  She,  has a past medical history of Anxiety, Arthritis, Asthma, Depression, Diabetes mellitus without complication ([place]), Fibromyalgia, GERD (gastroesophageal reflux disease), Hyperlipemia, Hypertension, and PONV (postoperative nausea and vomiting).   Surgical History:   Past Surgical History:  Procedure Laterality Date   COLONOSCOPY     DILATION AND CURETTAGE OF UTERUS      KNEE ARTHROSCOPY  [id number]   left   SHOULDER ARTHROSCOPY     left   TRIGGER FINGER RELEASE  08/13/2012   Procedure: RELEASE TRIGGER FINGER/A-1 PULLEY;  Surgeon: [name]., MD;  Location: [place];  Service: Orthopedics;  Laterality: Right;  long finger   TUBAL LIGATION       Social History:   reports that she quit smoking about 11 years ago. Her smoking use included cigarettes. She has a 10.00 pack-year smoking history. She has never used smokeless tobacco. She reports that she does not drink alcohol and does not use drugs.   Family History:  Her family history includes Diabetes in her mother and sister; Heart attack in her father; Hypertension in her daughter; Throat cancer in her brother.   Allergies Allergies  Allergen Reactions   Aspirin Nausea And Vomiting    Other reaction(s): Unknown   Benadryl [Diphenhydramine]  Can only take dye free. States the dye causes itching.   Darvon [Propoxyphene] Itching    Can only during the day. Night time makes her itch   Hydrocodone Bit-Homatrop Mbr Itching   Metformin     Other reaction(s): GI   Other     Other reaction(s): Unknown. States reaction was to nasal spray that caused pupils to shrink   Pentazocine     nervous Other reaction(s): jitters   Percocet [Oxycodone-Acetaminophen]     itching   Sulfa Antibiotics Hives   Tetracaine Hcl     Other reaction(s): Unknown. Thinks caused itching.   Tetracyclines & Related Hives   Tramadol     Other reaction(s): itch     Home Medications  Prior to Admission medications   Medication Sig Start Date End Date Taking? Authorizing Provider  albuterol (PROVENTIL HFA;VENTOLIN HFA) 108 (90 BASE) MCG/ACT inhaler Inhale 1-2 puffs into the lungs every 4 (four) hours as needed for wheezing or shortness of breath.     [provider]  Alcohol Swabs (B-D SINGLE USE SWABS REGULAR) PADS Apply topically. 03/08/21   [provider]  ALPRAZolam Duanne Moron)  0.25 MG tablet Take 0.25 mg by mouth daily as needed for anxiety. 02/19/19   [provider]  baclofen (LIORESAL) 10 MG tablet TAKE 1 TABLET(10 MG) BY MOUTH TWICE DAILY AS NEEDED FOR MUSCLE SPASMS 06/15/21   Bo Merino, MD  BD PEN NEEDLE NANO 2ND GEN 32G X 4 MM MISC USE DAILY WITH INSULIN PEN ONCE A DAY 09/24/20   [provider]  betamethasone dipropionate (DIPROLENE) 0.05 % ointment 1 application to affected area 03/15/15   [provider]  Blood Glucose Calibration (TRUE METRIX LEVEL 1) Low SOLN  12/01/20   [provider]  Blood Glucose Monitoring Suppl (TRUE METRIX METER) w/Device KIT  12/01/20   [provider]  cephALEXin (KEFLEX) 500 MG capsule Take 1 capsule (500 mg total) by mouth 3 (three) times daily. 09/14/21   Trula Slade, DPM  Cholecalciferol (VITAMIN D) 50 MCG (2000 UT) CAPS Take by mouth every other day.    [provider]  diclofenac Sodium (VOLTAREN) 1 % GEL Apply 2 g topically 4 (four) times daily. Rub into affected area of foot 2 to 4 times daily Patient not taking: Reported on 06/12/2021 01/24/20   Trula Slade, DPM  diphenhydrAMINE HCl (BENADRYL ALLERGY PO)     [provider]  Elastic Bandages & Supports (MEDICAL COMPRESSION STOCKINGS) [place] 1 each by Does not apply route every 6 (six) hours as needed (swelling). 04/28/19   Wieters, Hallie C, PA-C  fluticasone (FLONASE) 50 MCG/ACT nasal spray 2 sprays 08/07/10   [provider]  gabapentin (NEURONTIN) 300 MG capsule Take 1 capsule by mouth 2 (two) times daily.    [provider]  glucose blood test strip See admin instructions. 04/05/13   [provider]  hydrochlorothiazide (HYDRODIURIL) 12.5 MG tablet Take 12.5 mg by mouth daily. 02/10/19   [provider]  Insulin Pen Needle (BD PEN NEEDLE NANO U/F) 32G X 4 MM MISC to use daily with insulin pen    [provider]  LANTUS SOLOSTAR 100 UNIT/ML Solostar Pen Inject  22 Units into the skin at bedtime. 12/09/18   [provider]  Molnupiravir 200 MG CAPS TAKE 4 CAPSULES BY MOUTH 2 TIMES DAILY FOR 5 DAYS Patient not taking: Reported on 06/12/2021 11/21/20 11/21/21  Early, Coralee Pesa, NP  OneTouch Delica Lancets 09G MISC Use  to check blood sugar once a day for Diabetes; DX Code: E11.69 04/04/20   [provider]  pantoprazole (PROTONIX) 40 MG tablet Take 1 tablet by mouth 2 (two) times daily.    [provider]  predniSONE (DELTASONE) 20 MG tablet Take 40 mg by mouth daily. Patient not taking: Reported on 06/12/2021 05/16/21   [provider]  repaglinide (PRANDIN) 2 MG tablet Take 2 mg by mouth 2 (two) times daily before a meal.    [provider]  simvastatin (ZOCOR) 40 MG tablet Take 1 tablet by mouth daily.    [provider]  temazepam (RESTORIL) 15 MG capsule TAKE 1 CAPSULE(15 MG) BY MOUTH AT BEDTIME AS NEEDED 10/15/21   Ofilia Neas, PA-C     Critical care time: 57 minutes     Noe Gens, MSN, APRN, NP-C, AGACNP-BC  Pulmonary & Critical Care 10/28/2021, 1:23 PM   Please see [email or web address] for pager details.   From 7A-7P if no response, please call [phone] After hours, please call ELink [phone]

## 2021-10-28 NOTE — Progress Notes (Addendum)
Echo with RV strain, CT with large saddle PE, hematoma around RIJ CVC, likely RUQ bleeding, 2 rib fractures.   INR 3  2 units of FFP orderd but held after CT resulted with large thrombus. Not a candidate for TPA with concern for major bleeding and coagulopathy from initial CBC today. Now decreasing pressors-- almost off NE, epi down to 15.  ABG with adequate oxygenation. Trop 1525, LA> 9.   Discussed case with IR & AHF. Would be a candidate for embolectomy, but question if she is stable enough for this post-arrest with ongoing shock and coagulopathy. Would need ECMO standby. Needs abdominal CT to assess for intraperitoneal bleeding.  Repeat CBC with H/h 11.5/26.2 Platelets 197  I updated her sister and one of her daughters with her diagnosis and the risks & benefits of her care moving forward. We will have to watch her carefully overnight to determine if waiting until tomorrow for embolectomy would be in her best interest to hopefully reduce her arrhythmia risk. Hopefully AC will be an option overnight if her abdominal bleeding is minimal. Her family understands how tenuous this situation is. Serial labs ordered overnight. Night team will be updated.  Additional critical care time: 40 min.  Julian Hy, DO 10/28/21 6:27 PM [facility]

## 2021-10-28 NOTE — Procedures (Signed)
Patient Name: [name]  MRN: [id number]  Epilepsy Attending: Lora Havens  Referring Physician/Provider: Donita Brooks, NP Date: 10/28/2021 Duration: 21.01 mins  Patient history: 71yo F s/p cardiac arrest. EEG to evaluate for seizure  Level of alertness: comatos  AEDs during EEG study: None  Technical aspects: This EEG study was done with scalp electrodes positioned according to the 10-20 International system of electrode placement. Electrical activity was acquired at a sampling rate of 500Hz  and reviewed with a high frequency filter of 70Hz  and a low frequency filter of 1Hz . EEG data were recorded continuously and digitally stored.   Description: EEG showed near continuous generalized low amplitude 3 to 6 Hz theta-delta slowing admixed with 2-4 seconds of eeg attenuation. There was also 15-18hz  frontocentral beta activity intermittently. Hyperventilation and photic stimulation were not performed.     ABNORMALITY - Continuous slow, generalized - Background attenuation, generalized  IMPRESSION: This study is suggestive of severe to profound diffuse encephalopathy, nonspecific etiology. No seizures or epileptiform discharges were seen throughout the recording.  Aylyn Wenzler Barbra Sarks

## 2021-10-28 NOTE — Progress Notes (Signed)
Pharmacy Antibiotic Note  [name] is a 71 y.o. female admitted on 10/28/2021 with sepsis.  Pharmacy has been consulted for vancomycin and cefepime dosing.  Patient presenting post VF arrest.  SCr 1.1 - at baseline. However, SCr drawn shortly after arrival. Several periods of profound hypotension and rounds of CPR followed. Would suspect variability in next draw.  WBC 3.3; T 95.8 F  Plan: Cefepime 2g q12hr Vancomycin 1500 mg once, subsequent dosing as indicated per random vancomycin level until renal function stable and/or improved, at which time scheduled dosing can be considered Trend WBC, Fever, Renal function, & Clinical course F/u cultures, clinical course, WBC, fever De-escalate when able     No data recorded.  Recent Labs  Lab 10/28/21 1310 10/28/21 1311 10/28/21 1324  WBC 3.3*  --   --   CREATININE  --  1.10* 1.10*    CrCl cannot be calculated (Unknown ideal weight.).    Allergies  Allergen Reactions   Aspirin Nausea And Vomiting    Other reaction(s): Unknown   Benadryl [Diphenhydramine]     Can only take dye free. States the dye causes itching.   Darvon [Propoxyphene] Itching    Can only during the day. Night time makes her itch   Hydrocodone Bit-Homatrop Mbr Itching   Metformin     Other reaction(s): GI   Other     Other reaction(s): Unknown. States reaction was to nasal spray that caused pupils to shrink   Pentazocine     nervous Other reaction(s): jitters   Percocet [Oxycodone-Acetaminophen]     itching   Sulfa Antibiotics Hives   Tetracaine Hcl     Other reaction(s): Unknown. Thinks caused itching.   Tetracyclines & Related Hives   Tramadol     Other reaction(s): itch    Antimicrobials this admission: cefepime 1/1 >>  vancomycin 1/1 >>  Microbiology results: Pending  Thank you for allowing pharmacy to be a part of this patients care.  Lorelei Pont, PharmD, BCPS 10/28/2021 2:13 PM ED Clinical Pharmacist -  [phone]

## 2021-10-28 NOTE — Progress Notes (Signed)
EEG complete - results pending 

## 2021-10-28 NOTE — ED Triage Notes (Signed)
Add on from triage note-   EMS administered 3 rounds of Epi 300 of Amiodarone  Defibrillated twice at 200 and 300 J. Pulses returned as pulling into hospital.

## 2021-10-29 ENCOUNTER — Inpatient Hospital Stay (HOSPITAL_COMMUNITY): Payer: Medicare PPO

## 2021-10-29 DIAGNOSIS — I469 Cardiac arrest, cause unspecified: Secondary | ICD-10-CM | POA: Diagnosis not present

## 2021-10-29 HISTORY — PX: IR US GUIDE VASC ACCESS RIGHT: IMG2390

## 2021-10-29 HISTORY — PX: IR THROMBECT PRIM MECH INIT (INCLU) MOD SED: IMG2297

## 2021-10-29 LAB — CBC WITH DIFFERENTIAL/PLATELET
Abs Immature Granulocytes: 0.51 10*3/uL — ABNORMAL HIGH (ref 0.00–0.07)
Abs Immature Granulocytes: 0 10*3/uL (ref 0.00–0.07)
Abs Immature Granulocytes: 0.21 10*3/uL — ABNORMAL HIGH (ref 0.00–0.07)
Abs Immature Granulocytes: 0.29 10*3/uL — ABNORMAL HIGH (ref 0.00–0.07)
Abs Immature Granulocytes: 0 10*3/uL (ref 0.00–0.07)
Abs Immature Granulocytes: 0 10*3/uL (ref 0.00–0.07)
Basophils Absolute: 0.1 10*3/uL (ref 0.0–0.1)
Basophils Absolute: 0 10*3/uL (ref 0.0–0.1)
Basophils Absolute: 0 10*3/uL (ref 0.0–0.1)
Basophils Absolute: 0 10*3/uL (ref 0.0–0.1)
Basophils Absolute: 0 10*3/uL (ref 0.0–0.1)
Basophils Absolute: 0.2 10*3/uL — ABNORMAL HIGH (ref 0.0–0.1)
Basophils Relative: 0 %
Basophils Relative: 0 %
Basophils Relative: 0 %
Basophils Relative: 0 %
Basophils Relative: 0 %
Basophils Relative: 1 %
Eosinophils Absolute: 0 10*3/uL (ref 0.0–0.5)
Eosinophils Absolute: 0 10*3/uL (ref 0.0–0.5)
Eosinophils Absolute: 0 10*3/uL (ref 0.0–0.5)
Eosinophils Absolute: 0.2 10*3/uL (ref 0.0–0.5)
Eosinophils Absolute: 0 10*3/uL (ref 0.0–0.5)
Eosinophils Absolute: 0 10*3/uL (ref 0.0–0.5)
Eosinophils Relative: 0 %
Eosinophils Relative: 0 %
Eosinophils Relative: 0 %
Eosinophils Relative: 1 %
Eosinophils Relative: 0 %
Eosinophils Relative: 0 %
HCT: 37.8 % (ref 36.0–46.0)
HCT: 35.5 % — ABNORMAL LOW (ref 36.0–46.0)
HCT: 33.6 % — ABNORMAL LOW (ref 36.0–46.0)
HCT: 32.9 % — ABNORMAL LOW (ref 36.0–46.0)
HCT: 29.7 % — ABNORMAL LOW (ref 36.0–46.0)
HCT: 31 % — ABNORMAL LOW (ref 36.0–46.0)
Hemoglobin: 12.3 g/dL (ref 12.0–15.0)
Hemoglobin: 11.7 g/dL — ABNORMAL LOW (ref 12.0–15.0)
Hemoglobin: 11.5 g/dL — ABNORMAL LOW (ref 12.0–15.0)
Hemoglobin: 11.2 g/dL — ABNORMAL LOW (ref 12.0–15.0)
Hemoglobin: 10.4 g/dL — ABNORMAL LOW (ref 12.0–15.0)
Hemoglobin: 10.6 g/dL — ABNORMAL LOW (ref 12.0–15.0)
Immature Granulocytes: 2 %
Immature Granulocytes: 1 %
Immature Granulocytes: 2 %
Lymphocytes Relative: 6 %
Lymphocytes Relative: 5 %
Lymphocytes Relative: 9 %
Lymphocytes Relative: 10 %
Lymphocytes Relative: 5 %
Lymphocytes Relative: 17 %
Lymphs Abs: 1.5 10*3/uL (ref 0.7–4.0)
Lymphs Abs: 1.2 10*3/uL (ref 0.7–4.0)
Lymphs Abs: 1.9 10*3/uL (ref 0.7–4.0)
Lymphs Abs: 2 10*3/uL (ref 0.7–4.0)
Lymphs Abs: 0.9 10*3/uL (ref 0.7–4.0)
Lymphs Abs: 3.5 10*3/uL (ref 0.7–4.0)
MCH: 27.2 pg (ref 26.0–34.0)
MCH: 27 pg (ref 26.0–34.0)
MCH: 27.1 pg (ref 26.0–34.0)
MCH: 26.9 pg (ref 26.0–34.0)
MCH: 27.4 pg (ref 26.0–34.0)
MCH: 26.9 pg (ref 26.0–34.0)
MCHC: 32.5 g/dL (ref 30.0–36.0)
MCHC: 33 g/dL (ref 30.0–36.0)
MCHC: 34.2 g/dL (ref 30.0–36.0)
MCHC: 34 g/dL (ref 30.0–36.0)
MCHC: 35 g/dL (ref 30.0–36.0)
MCHC: 34.2 g/dL (ref 30.0–36.0)
MCV: 83.4 fL (ref 80.0–100.0)
MCV: 81.8 fL (ref 80.0–100.0)
MCV: 79.2 fL — ABNORMAL LOW (ref 80.0–100.0)
MCV: 79.1 fL — ABNORMAL LOW (ref 80.0–100.0)
MCV: 78.4 fL — ABNORMAL LOW (ref 80.0–100.0)
MCV: 78.7 fL — ABNORMAL LOW (ref 80.0–100.0)
Monocytes Absolute: 0.9 10*3/uL (ref 0.1–1.0)
Monocytes Absolute: 0.2 10*3/uL (ref 0.1–1.0)
Monocytes Absolute: 0.4 10*3/uL (ref 0.1–1.0)
Monocytes Absolute: 0.4 10*3/uL (ref 0.1–1.0)
Monocytes Absolute: 0.4 10*3/uL (ref 0.1–1.0)
Monocytes Absolute: 0 10*3/uL — ABNORMAL LOW (ref 0.1–1.0)
Monocytes Relative: 3 %
Monocytes Relative: 1 %
Monocytes Relative: 2 %
Monocytes Relative: 2 %
Monocytes Relative: 2 %
Monocytes Relative: 0 %
Neutro Abs: 23.8 10*3/uL — ABNORMAL HIGH (ref 1.7–7.7)
Neutro Abs: 23 10*3/uL — ABNORMAL HIGH (ref 1.7–7.7)
Neutro Abs: 18.2 10*3/uL — ABNORMAL HIGH (ref 1.7–7.7)
Neutro Abs: 16.8 10*3/uL — ABNORMAL HIGH (ref 1.7–7.7)
Neutro Abs: 17 10*3/uL — ABNORMAL HIGH (ref 1.7–7.7)
Neutro Abs: 16.8 10*3/uL — ABNORMAL HIGH (ref 1.7–7.7)
Neutrophils Relative %: 89 %
Neutrophils Relative %: 94 %
Neutrophils Relative %: 88 %
Neutrophils Relative %: 85 %
Neutrophils Relative %: 93 %
Neutrophils Relative %: 82 %
Platelets: 204 10*3/uL (ref 150–400)
Platelets: 181 10*3/uL (ref 150–400)
Platelets: 168 10*3/uL (ref 150–400)
Platelets: 156 10*3/uL (ref 150–400)
Platelets: 130 10*3/uL — ABNORMAL LOW (ref 150–400)
Platelets: 122 10*3/uL — ABNORMAL LOW (ref 150–400)
RBC: 4.53 MIL/uL (ref 3.87–5.11)
RBC: 4.34 MIL/uL (ref 3.87–5.11)
RBC: 4.24 MIL/uL (ref 3.87–5.11)
RBC: 4.16 MIL/uL (ref 3.87–5.11)
RBC: 3.79 MIL/uL — ABNORMAL LOW (ref 3.87–5.11)
RBC: 3.94 MIL/uL (ref 3.87–5.11)
RDW: 14.6 % (ref 11.5–15.5)
RDW: 14.4 % (ref 11.5–15.5)
RDW: 14.4 % (ref 11.5–15.5)
RDW: 14.1 % (ref 11.5–15.5)
RDW: 14.1 % (ref 11.5–15.5)
RDW: 14 % (ref 11.5–15.5)
WBC: 26.8 10*3/uL — ABNORMAL HIGH (ref 4.0–10.5)
WBC: 24.5 10*3/uL — ABNORMAL HIGH (ref 4.0–10.5)
WBC: 20.8 10*3/uL — ABNORMAL HIGH (ref 4.0–10.5)
WBC: 19.7 10*3/uL — ABNORMAL HIGH (ref 4.0–10.5)
WBC: 18.3 10*3/uL — ABNORMAL HIGH (ref 4.0–10.5)
WBC: 20.5 10*3/uL — ABNORMAL HIGH (ref 4.0–10.5)
nRBC: 0.1 % (ref 0.0–0.2)
nRBC: 0 % (ref 0.0–0.2)
nRBC: 0 /100 WBC
nRBC: 0 % (ref 0.0–0.2)
nRBC: 0 % (ref 0.0–0.2)
nRBC: 0 /100 WBC
nRBC: 0.1 % (ref 0.0–0.2)
nRBC: 0.1 % (ref 0.0–0.2)
nRBC: 2 /100 WBC — ABNORMAL HIGH

## 2021-10-29 LAB — GLUCOSE, CAPILLARY
Glucose-Capillary: 124 mg/dL — ABNORMAL HIGH (ref 70–99)
Glucose-Capillary: 155 mg/dL — ABNORMAL HIGH (ref 70–99)
Glucose-Capillary: 231 mg/dL — ABNORMAL HIGH (ref 70–99)
Glucose-Capillary: 268 mg/dL — ABNORMAL HIGH (ref 70–99)
Glucose-Capillary: 325 mg/dL — ABNORMAL HIGH (ref 70–99)
Glucose-Capillary: 358 mg/dL — ABNORMAL HIGH (ref 70–99)
Glucose-Capillary: 362 mg/dL — ABNORMAL HIGH (ref 70–99)
Glucose-Capillary: 400 mg/dL — ABNORMAL HIGH (ref 70–99)
Glucose-Capillary: 423 mg/dL — ABNORMAL HIGH (ref 70–99)
Glucose-Capillary: 441 mg/dL — ABNORMAL HIGH (ref 70–99)
Glucose-Capillary: 443 mg/dL — ABNORMAL HIGH (ref 70–99)
Glucose-Capillary: 448 mg/dL — ABNORMAL HIGH (ref 70–99)
Glucose-Capillary: 459 mg/dL — ABNORMAL HIGH (ref 70–99)
Glucose-Capillary: 465 mg/dL — ABNORMAL HIGH (ref 70–99)
Glucose-Capillary: 470 mg/dL — ABNORMAL HIGH (ref 70–99)
Glucose-Capillary: 483 mg/dL — ABNORMAL HIGH (ref 70–99)
Glucose-Capillary: 500 mg/dL — ABNORMAL HIGH (ref 70–99)
Glucose-Capillary: 502 mg/dL (ref 70–99)
Glucose-Capillary: 515 mg/dL (ref 70–99)
Glucose-Capillary: 522 mg/dL (ref 70–99)
Glucose-Capillary: 83 mg/dL (ref 70–99)
Glucose-Capillary: 85 mg/dL (ref 70–99)
Glucose-Capillary: 86 mg/dL (ref 70–99)
Glucose-Capillary: 89 mg/dL (ref 70–99)
Glucose-Capillary: 96 mg/dL (ref 70–99)

## 2021-10-29 LAB — COMPREHENSIVE METABOLIC PANEL
ALT: 1210 U/L — ABNORMAL HIGH (ref 0–44)
AST: 2275 U/L — ABNORMAL HIGH (ref 15–41)
Albumin: 2.1 g/dL — ABNORMAL LOW (ref 3.5–5.0)
Alkaline Phosphatase: 86 U/L (ref 38–126)
Anion gap: 21 — ABNORMAL HIGH (ref 5–15)
BUN: 35 mg/dL — ABNORMAL HIGH (ref 8–23)
CO2: 17 mmol/L — ABNORMAL LOW (ref 22–32)
Calcium: 8 mg/dL — ABNORMAL LOW (ref 8.9–10.3)
Chloride: 103 mmol/L (ref 98–111)
Creatinine, Ser: 3.52 mg/dL — ABNORMAL HIGH (ref 0.44–1.00)
GFR, Estimated: 13 mL/min — ABNORMAL LOW (ref >60)
Glucose, Bld: 545 mg/dL (ref 70–99)
Potassium: 3.1 mmol/L — ABNORMAL LOW (ref 3.5–5.1)
Sodium: 141 mmol/L (ref 135–145)
Total Bilirubin: 1.2 mg/dL (ref 0.3–1.2)
Total Protein: 4.5 g/dL — ABNORMAL LOW (ref 6.5–8.1)

## 2021-10-29 LAB — BASIC METABOLIC PANEL
Anion gap: 25 — ABNORMAL HIGH (ref 5–15)
Anion gap: 20 — ABNORMAL HIGH (ref 5–15)
Anion gap: 16 — ABNORMAL HIGH (ref 5–15)
Anion gap: 13 (ref 5–15)
BUN: 31 mg/dL — ABNORMAL HIGH (ref 8–23)
BUN: 38 mg/dL — ABNORMAL HIGH (ref 8–23)
BUN: 40 mg/dL — ABNORMAL HIGH (ref 8–23)
BUN: 41 mg/dL — ABNORMAL HIGH (ref 8–23)
CO2: 16 mmol/L — ABNORMAL LOW (ref 22–32)
CO2: 22 mmol/L (ref 22–32)
CO2: 24 mmol/L (ref 22–32)
CO2: 24 mmol/L (ref 22–32)
Calcium: 8.4 mg/dL — ABNORMAL LOW (ref 8.9–10.3)
Calcium: 7.9 mg/dL — ABNORMAL LOW (ref 8.9–10.3)
Calcium: 7.7 mg/dL — ABNORMAL LOW (ref 8.9–10.3)
Calcium: 7.2 mg/dL — ABNORMAL LOW (ref 8.9–10.3)
Chloride: 103 mmol/L (ref 98–111)
Chloride: 101 mmol/L (ref 98–111)
Chloride: 102 mmol/L (ref 98–111)
Chloride: 103 mmol/L (ref 98–111)
Creatinine, Ser: 3.18 mg/dL — ABNORMAL HIGH (ref 0.44–1.00)
Creatinine, Ser: 3.72 mg/dL — ABNORMAL HIGH (ref 0.44–1.00)
Creatinine, Ser: 3.85 mg/dL — ABNORMAL HIGH (ref 0.44–1.00)
Creatinine, Ser: 4.15 mg/dL — ABNORMAL HIGH (ref 0.44–1.00)
GFR, Estimated: 15 mL/min — ABNORMAL LOW (ref >60)
GFR, Estimated: 12 mL/min — ABNORMAL LOW (ref >60)
GFR, Estimated: 12 mL/min — ABNORMAL LOW (ref >60)
GFR, Estimated: 11 mL/min — ABNORMAL LOW (ref >60)
Glucose, Bld: 527 mg/dL (ref 70–99)
Glucose, Bld: 465 mg/dL — ABNORMAL HIGH (ref 70–99)
Glucose, Bld: 318 mg/dL — ABNORMAL HIGH (ref 70–99)
Glucose, Bld: 91 mg/dL (ref 70–99)
Potassium: 3.3 mmol/L — ABNORMAL LOW (ref 3.5–5.1)
Potassium: 3.1 mmol/L — ABNORMAL LOW (ref 3.5–5.1)
Potassium: 2.9 mmol/L — ABNORMAL LOW (ref 3.5–5.1)
Potassium: 3.7 mmol/L (ref 3.5–5.1)
Sodium: 144 mmol/L (ref 135–145)
Sodium: 143 mmol/L (ref 135–145)
Sodium: 142 mmol/L (ref 135–145)
Sodium: 140 mmol/L (ref 135–145)

## 2021-10-29 LAB — POCT I-STAT 7, (LYTES, BLD GAS, ICA,H+H)
Acid-base deficit: 9 mmol/L — ABNORMAL HIGH (ref 0.0–2.0)
Acid-base deficit: 6 mmol/L — ABNORMAL HIGH (ref 0.0–2.0)
Bicarbonate: 17 mmol/L — ABNORMAL LOW (ref 20.0–28.0)
Bicarbonate: 18.2 mmol/L — ABNORMAL LOW (ref 20.0–28.0)
Calcium, Ion: 1.11 mmol/L — ABNORMAL LOW (ref 1.15–1.40)
Calcium, Ion: 1.09 mmol/L — ABNORMAL LOW (ref 1.15–1.40)
HCT: 36 % (ref 36.0–46.0)
HCT: 36 % (ref 36.0–46.0)
Hemoglobin: 12.2 g/dL (ref 12.0–15.0)
Hemoglobin: 12.2 g/dL (ref 12.0–15.0)
O2 Saturation: 97 %
O2 Saturation: 98 %
Patient temperature: 36
Patient temperature: 36.9
Potassium: 3.2 mmol/L — ABNORMAL LOW (ref 3.5–5.1)
Potassium: 3.1 mmol/L — ABNORMAL LOW (ref 3.5–5.1)
Sodium: 143 mmol/L (ref 135–145)
Sodium: 144 mmol/L (ref 135–145)
TCO2: 18 mmol/L — ABNORMAL LOW (ref 22–32)
TCO2: 19 mmol/L — ABNORMAL LOW (ref 22–32)
pCO2 arterial: 33.6 mmHg (ref 32.0–48.0)
pCO2 arterial: 31.2 mmHg — ABNORMAL LOW (ref 32.0–48.0)
pH, Arterial: 7.307 — ABNORMAL LOW (ref 7.350–7.450)
pH, Arterial: 7.374 (ref 7.350–7.450)
pO2, Arterial: 90 mmHg (ref 83.0–108.0)
pO2, Arterial: 100 mmHg (ref 83.0–108.0)

## 2021-10-29 LAB — LACTIC ACID, PLASMA
Lactic Acid, Venous: >9 mmol/L (ref 0.5–1.9)
Lactic Acid, Venous: >9 mmol/L (ref 0.5–1.9)
Lactic Acid, Venous: 8.1 mmol/L (ref 0.5–1.9)
Lactic Acid, Venous: 5 mmol/L (ref 0.5–1.9)

## 2021-10-29 LAB — HEPARIN LEVEL (UNFRACTIONATED)
Heparin Unfractionated: <0.1 IU/mL — ABNORMAL LOW (ref 0.30–0.70)
Heparin Unfractionated: 0.16 IU/mL — ABNORMAL LOW (ref 0.30–0.70)
Heparin Unfractionated: 0.16 IU/mL — ABNORMAL LOW (ref 0.30–0.70)

## 2021-10-29 LAB — BRAIN NATRIURETIC PEPTIDE
B Natriuretic Peptide: 392.1 pg/mL — ABNORMAL HIGH (ref 0.0–100.0)
B Natriuretic Peptide: 1111.6 pg/mL — ABNORMAL HIGH (ref 0.0–100.0)
B Natriuretic Peptide: 1125.8 pg/mL — ABNORMAL HIGH (ref 0.0–100.0)
B Natriuretic Peptide: 1141.5 pg/mL — ABNORMAL HIGH (ref 0.0–100.0)

## 2021-10-29 LAB — HEPATIC FUNCTION PANEL
ALT: 1233 U/L — ABNORMAL HIGH (ref 0–44)
AST: 2252 U/L — ABNORMAL HIGH (ref 15–41)
Albumin: 2.1 g/dL — ABNORMAL LOW (ref 3.5–5.0)
Alkaline Phosphatase: 86 U/L (ref 38–126)
Bilirubin, Direct: 0.4 mg/dL — ABNORMAL HIGH (ref 0.0–0.2)
Indirect Bilirubin: 0.9 mg/dL (ref 0.3–0.9)
Total Bilirubin: 1.3 mg/dL — ABNORMAL HIGH (ref 0.3–1.2)
Total Protein: 4.8 g/dL — ABNORMAL LOW (ref 6.5–8.1)

## 2021-10-29 LAB — DIC (DISSEMINATED INTRAVASCULAR COAGULATION)PANEL
D-Dimer, Quant: >20 ug/mL-FEU — ABNORMAL HIGH (ref 0.00–0.50)
Fibrinogen: 93 mg/dL — CL (ref 210–475)
INR: 2.2 — ABNORMAL HIGH (ref 0.8–1.2)
Platelets: 132 10*3/uL — ABNORMAL LOW (ref 150–400)
Prothrombin Time: 24.8 seconds — ABNORMAL HIGH (ref 11.4–15.2)
Smear Review: NONE SEEN
aPTT: 198 seconds (ref 24–36)

## 2021-10-29 LAB — MAGNESIUM: Magnesium: 2.2 mg/dL (ref 1.7–2.4)

## 2021-10-29 LAB — TROPONIN I (HIGH SENSITIVITY)
Troponin I (High Sensitivity): 8219 ng/L (ref <18)
Troponin I (High Sensitivity): 11507 ng/L (ref <18)
Troponin I (High Sensitivity): 14724 ng/L (ref <18)

## 2021-10-29 LAB — BLOOD PRODUCT ORDER (VERBAL) VERIFICATION

## 2021-10-29 LAB — PROCALCITONIN: Procalcitonin: 112.8 ng/mL

## 2021-10-29 LAB — TRIGLYCERIDES: Triglycerides: 149 mg/dL (ref <150)

## 2021-10-29 LAB — PHOSPHORUS: Phosphorus: 3.6 mg/dL (ref 2.5–4.6)

## 2021-10-29 LAB — POCT ACTIVATED CLOTTING TIME
Activated Clotting Time: 173 seconds
Activated Clotting Time: 245 seconds

## 2021-10-29 LAB — BETA-HYDROXYBUTYRIC ACID: Beta-Hydroxybutyric Acid: 0.09 mmol/L (ref 0.05–0.27)

## 2021-10-29 MED ORDER — DEXTROSE 50 % IV SOLN
0.0000 mL | INTRAVENOUS | Status: DC | PRN
  Administered 2021-11-01: 25 mL via INTRAVENOUS
  Filled 2021-10-29 (×2): qty 50

## 2021-10-29 MED ORDER — IOHEXOL 300 MG/ML  SOLN
100.0000 mL | Freq: Once | INTRAMUSCULAR | Status: AC | PRN
  Administered 2021-10-29: 30 mL via INTRA_ARTERIAL

## 2021-10-29 MED ORDER — INSULIN REGULAR(HUMAN) IN NACL 100-0.9 UT/100ML-% IV SOLN
INTRAVENOUS | Status: DC
  Administered 2021-10-29: 30 [IU]/h via INTRAVENOUS
  Administered 2021-10-29: 18 [IU]/h via INTRAVENOUS
  Administered 2021-10-29: 10 [IU]/h via INTRAVENOUS
  Administered 2021-10-31: 0.5 [IU]/h via INTRAVENOUS
  Administered 2021-11-01: 1.1 [IU]/h via INTRAVENOUS
  Filled 2021-10-29 (×7): qty 100

## 2021-10-29 MED ORDER — POTASSIUM CHLORIDE 10 MEQ/50ML IV SOLN
10.0000 meq | INTRAVENOUS | Status: AC
  Administered 2021-10-29 (×2): 10 meq via INTRAVENOUS
  Filled 2021-10-29 (×2): qty 50

## 2021-10-29 MED ORDER — HEPARIN BOLUS VIA INFUSION
2000.0000 [IU] | Freq: Once | INTRAVENOUS | Status: AC
  Administered 2021-10-29: 2000 [IU] via INTRAVENOUS

## 2021-10-29 MED ORDER — LIDOCAINE HCL 1 % IJ SOLN
INTRAMUSCULAR | Status: AC
  Filled 2021-10-29: qty 20

## 2021-10-29 MED ORDER — LACTATED RINGERS IV SOLN: INTRAVENOUS | Status: DC

## 2021-10-29 MED ORDER — LACTATED RINGERS IV SOLN
INTRAVENOUS | Status: DC
Start: 2021-10-29 — End: 2021-10-29

## 2021-10-29 MED ORDER — ORAL CARE MOUTH RINSE
15.0000 mL | OROMUCOSAL | Status: DC
  Administered 2021-10-29 – 2021-11-06 (×85): 15 mL via OROMUCOSAL

## 2021-10-29 MED ORDER — CHLORHEXIDINE GLUCONATE CLOTH 2 % EX PADS
6.0000 | MEDICATED_PAD | Freq: Every day | CUTANEOUS | Status: DC
  Administered 2021-10-30 – 2021-12-04 (×30): 6 via TOPICAL

## 2021-10-29 MED ORDER — CHLORHEXIDINE GLUCONATE 0.12% ORAL RINSE (MEDLINE KIT)
15.0000 mL | Freq: Two times a day (BID) | OROMUCOSAL | Status: DC
  Administered 2021-10-29 – 2021-11-06 (×16): 15 mL via OROMUCOSAL

## 2021-10-29 MED ORDER — SODIUM CHLORIDE 0.9 % IV SOLN
2.0000 g | INTRAVENOUS | Status: DC
  Administered 2021-10-30 – 2021-11-04 (×6): 2 g via INTRAVENOUS
  Filled 2021-10-29 (×6): qty 2

## 2021-10-29 MED ORDER — POTASSIUM CHLORIDE 10 MEQ/50ML IV SOLN
10.0000 meq | INTRAVENOUS | Status: AC
  Administered 2021-10-29 (×4): 10 meq via INTRAVENOUS
  Filled 2021-10-29 (×4): qty 50

## 2021-10-29 NOTE — Progress Notes (Addendum)
Met with Dr. Denna Haggard in afternoon, pressor requirements still high and climbing slightly.  Discussed risks/benefits of mechanical thrombectomy given ongoing DIC driven by clot burden; all agreed to proceed.  Accompanied patient down to IR, had some ectopy and hypotension with device advancement into PA; adjusted pressors during.  Hypoxemia during procedure requiring FiO2/PEEP adjustment.  Patient overall tolerated procedure well and pressor requirements have improved.  Additional 50 min cc time spent on above. IR to update family.  Greatly appreciate their help with this tough case.  Erskine Emery MD PCCM

## 2021-10-29 NOTE — Progress Notes (Signed)
Pharmacy Antibiotic Note  [name] is a 71 y.o. female admitted on 10/28/2021 with sepsis.  Pharmacy has been consulted for vancomycin and cefepime dosing.  Patient presenting post VF arrest.  SCr 1.1 > now up to 3.52.  UOP ~ 0.5 ml/kg/hr  WBC 3.3 (error?) > 24.5; T 98.6 F  Plan: Change cefepime to 2g q 24 hrs for now Received loading dose of vancomycin - will not need re-dose until Scr improves or dialyzed.  Will check random level tomorrow AM. F/u renal function, cultures and clinical course.  Height: 5\' 5"  (165.1 cm) Weight: 81.1 kg (178 lb 12.7 oz) IBW/kg (Calculated) : 57  Temp (24hrs), Avg:97.9 F (36.6 C), Min:95.7 F (35.4 C), Max:99.9 F (37.7 C)  Recent Labs  Lab 10/28/21 1311 10/28/21 1324 10/28/21 1554 10/28/21 1710 10/28/21 1723 10/28/21 1830 10/28/21 2036 10/28/21 2135 10/29/21 0106 10/29/21 0503  WBC  --   --   --  23.7* 23.0*  --  27.0*  --  26.8* 24.5*  CREATININE 1.10* 1.10*  --   --   --   --  2.69*  --  3.18* 3.52*  LATICACIDVEN  --   --  >9.0*  --   --  >9.0*  --  >9.0* >9.0* >9.0*     Estimated Creatinine Clearance: 15.2 mL/min (A) (by C-G formula based on SCr of 3.52 mg/dL (H)).    Allergies  Allergen Reactions   Aspirin Nausea And Vomiting    Other reaction(s): Unknown   Benadryl [Diphenhydramine]     Can only take dye free. States the dye causes itching.   Darvon [Propoxyphene] Itching    Can only during the day. Night time makes her itch   Hydrocodone Bit-Homatrop Mbr Itching   Metformin     Other reaction(s): GI   Other     Other reaction(s): Unknown. States reaction was to nasal spray that caused pupils to shrink   Pentazocine     nervous Other reaction(s): jitters   Percocet [Oxycodone-Acetaminophen]     itching   Sulfa Antibiotics Hives   Tetracaine Hcl     Other reaction(s): Unknown. Thinks caused itching.   Tetracyclines & Related Hives   Tramadol     Other reaction(s): itch    Antimicrobials this  admission: cefepime 1/1 >>  vancomycin 1/1 >>  Microbiology results: 1/1 BCx x 2 >> 1/1 Flu/Covid negative  Thank you for allowing pharmacy to be a part of this patients care.  [place] Crane, Roylene Reason, BCCP Clinical Pharmacist  10/29/2021 8:35 AM   [facility] pharmacy phone numbers are listed on [email or web address]

## 2021-10-29 NOTE — Procedures (Addendum)
Patient Name: [name]  MRN: [id number]  Epilepsy Attending: Lora Havens  Referring Physician/Provider: Donita Brooks, NP Duration: 10/28/2021 1920 to 10/29/2021 1920   Patient history: 71yo F s/p cardiac arrest. EEG to evaluate for seizure   Level of alertness: lethargic   AEDs during EEG study: None   Technical aspects: This EEG study was done with scalp electrodes positioned according to the 10-20 International system of electrode placement. Electrical activity was acquired at a sampling rate of 500Hz  and reviewed with a high frequency filter of 70Hz  and a low frequency filter of 1Hz . EEG data were recorded continuously and digitally stored.    Description: EEG showed near continuous generalized low amplitude 3 to 6 Hz theta-delta slowing admixed with 2-4 seconds of eeg attenuation. There was also 15-18hz  frontocentral beta activity intermittently. Hyperventilation and photic stimulation were not performed.     Patient event button was pressed on 10/29/2021 at 1057 and 1213 for shaking while being repositioned.  Concomitant EEG before, during and after the event did not show any EEG changes suggest seizure.   ABNORMALITY - Continuous slow, generalized - Background attenuation, generalized   IMPRESSION: This study is suggestive of severe to profound diffuse encephalopathy, nonspecific etiology. No seizures or epileptiform discharges were seen throughout the recording.  Patient event button was pressed on 10/29/2021 at 1057 and 1213 for shaking while being repositioned without concomitant EEG change.  These episodes were not epileptic.   Tydus Sanmiguel Barbra Sarks

## 2021-10-29 NOTE — H&P (Signed)
Interventional Radiology - Inpatient Consult H&P    Referring Provider (current admission): Candee Furbish, MD  Reason for Consult: PE    History of Present Illness  [name] is a 71 y.o. female seen in consultation for pulmonary embolism. She presented yesterday with pre-admission cardiac arrest for about 20 minutes. Imaging on arrival showed large saddle PE. She was admitted to ICU intubated and sedated with high pressor requirements that have been slowly weaned overnight. Unfortunately, her high risk PE has been complicated by a consumptive coagulopathy resulting in DIC, manifested by hematoma around her CVL and upper GI bleeding via NGT.   Family (daughters x2) was in the room at the time of my evaluation.    Additional Past Medical History Past Medical History:  Diagnosis Date   Anxiety    Arthritis    Asthma    Depression    Diabetes mellitus without complication (HCC)    Fibromyalgia    GERD (gastroesophageal reflux disease)    Hyperlipemia    Hypertension    PONV (postoperative nausea and vomiting)     Surgical History  Past Surgical History:  Procedure Laterality Date   COLONOSCOPY     DILATION AND CURETTAGE OF UTERUS     KNEE ARTHROSCOPY  [id number]   left   SHOULDER ARTHROSCOPY     left   TRIGGER FINGER RELEASE  08/13/2012   Procedure: RELEASE TRIGGER FINGER/A-1 PULLEY;  Surgeon: [name]., MD;  Location: [place];  Service: Orthopedics;  Laterality: Right;  long finger   TUBAL LIGATION       Medications  I have reviewed the current medication list. Refer to chart for details. Current Outpatient Medications  Medication Instructions   albuterol (PROVENTIL HFA;VENTOLIN HFA) 108 (90 BASE) MCG/ACT inhaler 1-2 puffs, Inhalation, Every 4 hours PRN   ALPRAZolam (XANAX) 0.25 mg, Oral, Daily PRN   baclofen (LIORESAL) 10 MG tablet TAKE 1 TABLET(10 MG) BY MOUTH TWICE DAILY AS NEEDED FOR MUSCLE SPASMS   betamethasone dipropionate  (DIPROLENE) 0.05 % ointment 1 application to affected area   cephALEXin (KEFLEX) 500 mg, Oral, 3 times daily   diphenhydrAMINE HCl (BENADRYL ALLERGY PO) No dose, route, or frequency recorded.   Elastic Bandages & Supports (MEDICAL COMPRESSION STOCKINGS) MISC 1 each, Does not apply, Every 6 hours PRN   fluticasone (FLONASE) 50 MCG/ACT nasal spray 2 sprays, Each Nare, Daily   gabapentin (NEURONTIN) 300 mg, Oral, 2 times daily   hydrochlorothiazide (HYDRODIURIL) 12.5 mg, Oral, Daily   Lantus SoloStar 22 Units, Subcutaneous, Daily at bedtime   pantoprazole (PROTONIX) 40 mg, Oral, 2 times daily   repaglinide (PRANDIN) 2 mg, 2 times daily before meals   simvastatin (ZOCOR) 40 mg, Oral, Daily   temazepam (RESTORIL) 15 MG capsule TAKE 1 CAPSULE(15 MG) BY MOUTH AT BEDTIME AS NEEDED   Vitamin D (Ergocalciferol) (DRISDOL) 50,000 Units, Oral, Weekly     Allergies Allergies  Allergen Reactions   Aspirin Nausea And Vomiting    Other reaction(s): Unknown   Benadryl [Diphenhydramine]     Can only take dye free. States the dye causes itching.   Darvon [Propoxyphene] Itching    Can only during the day. Night time makes her itch   Hydrocodone Bit-Homatrop Mbr Itching   Metformin     Other reaction(s): GI   Other     Other reaction(s): Unknown. States reaction was to nasal spray that caused pupils to shrink   Pentazocine     nervous Other  reaction(s): jitters   Percocet [Oxycodone-Acetaminophen]     itching   Sulfa Antibiotics Hives   Tetracaine Hcl     Other reaction(s): Unknown. Thinks caused itching.   Tetracyclines & Related Hives   Tramadol     Other reaction(s): itch   Does patient have contrast allergy: No     Physical Exam Current Vitals Temp: 98.6 F (37 C) (Temp Source: Bladder)   Pulse Rate: (!) 101   Resp: (!) 30   BP: (!) 132/59   SpO2: 95 %   Height: 5\' 5"  (165.1 cm)   Weight: 81.1 kg   Body mass index is 29.75 kg/m.  General: Intubated and sedated.  HEENT: ETT in place.  EEG leads present.  Cardiac: Tachycardic. Pulmonary: Mechanically ventilated.  Extremities: Normally-formed.    Pertinent Lab Results Labs: CBC: WBC/Hgb/Hct/Plts:  20.8/11.5/33.6/168 (01/02 0840)  BMP: BUN/Cr/glu/ALT/AST/amyl/lip:  38/3.72/--/--/--/--/-- (01/02 0840) Coagulation: PT/INR/PTT:  --/2.4/-- (01/02 0503)  CBC Trends: Recent Labs    10/29/21 0106 10/29/21 0119 10/29/21 0503 10/29/21 0510 10/29/21 0840  WBC 26.8*  --  24.5*  --  20.8*  HGB 12.3   < > 11.7* 12.2 11.5*  HCT 37.8   < > 35.5* 36.0 33.6*  PLT 204  --  188   181  --  168   < > = values in this interval not displayed.     Creatinine Trend: Recent Labs    10/29/21 0106 10/29/21 0503 10/29/21 0840  CREATININE 3.18* 3.52* 3.72*     Relevant and/or Recent Imaging: CTA chest 01/01, large saddle PE    Assessment & Plan [name] is a 71 y.o. female seen in consultation for pulmonary embolism. Given her hemodynamic instability on presentation and ongoing pressor requirements, she is high-risk PE by ESC criteria. I discussed the patient's unique predicament with her ongoing DIC with the intensivist Dr. Tamala Julian and family. I do think she would benefit from catheter directed thrombectomy, but the procedure would be high risk due to (1) high risk of unstable arrhythmia during right heart catheterization because of recent cardiac arrest, and (2) high risk of bleeding from access site or wire injury during procedure. The patient's pressor requirements have been decreasing, suggesting improving heart function. The main purpose of the thrombectomy is to offload right heart strain. Given her gradual improvement and high risk of procedure, it was the consensus agreement to hold off on thrombectomy and continue to closely observe the patient. We will reassess the patient in the early afternoon, and if showing signs of worsening heart function, then will likely recommend moving forward with thrombectomy at that time.      I spent a total of 40 Minutes  in face-to-face in clinical consultation, greater than 50% of which was spent on medical decision-making and counseling/coordinating care for pulmonary embolism.     Albin Felling, MD  Vascular and Interventional Radiology 10/29/2021 9:35 AM

## 2021-10-29 NOTE — Progress Notes (Signed)
[place] for heparin  Indication: pulmonary embolus  Allergies  Allergen Reactions   Aspirin Nausea And Vomiting    Other reaction(s): Unknown   Benadryl [Diphenhydramine]     Can only take dye free. States the dye causes itching.   Darvon [Propoxyphene] Itching    Can only during the day. Night time makes her itch   Hydrocodone Bit-Homatrop Mbr Itching   Metformin     Other reaction(s): GI   Other     Other reaction(s): Unknown. States reaction was to nasal spray that caused pupils to shrink   Pentazocine     nervous Other reaction(s): jitters   Percocet [Oxycodone-Acetaminophen]     itching   Sulfa Antibiotics Hives   Tetracaine Hcl     Other reaction(s): Unknown. Thinks caused itching.   Tetracyclines & Related Hives   Tramadol     Other reaction(s): itch    Patient Measurements: Height: _0  (165.1 cm) Weight: 81.1 kg (178 lb 12.7 oz) IBW/kg (Calculated) : 57  Vital Signs: Temp: 99.9 F (37.7 C) (01/02 2100) Temp Source: Bladder (01/02 2000) BP: 118/67 (01/02 1623) Pulse Rate: 104 (01/02 2130)  Labs: Recent Labs    10/28/21 2036 10/29/21 0106 10/29/21 0119 10/29/21 0503 10/29/21 0510 10/29/21 0840 10/29/21 1240 10/29/21 1646 10/29/21 1949 10/29/21 2105  HGB 12.1 12.3   < > 11.7*   < > 11.5* 11.2* 10.4* 10.6*  --   HCT 38.4 37.8   < > 35.5*   < > 33.6* 32.9* 29.7* 31.0*  --   PLT 191   198 204  --  188   181  --  168 156 132*   130* 122*  --   APTT 65*  --   --  49*  --   --   --  198*  --   --   LABPROT 32.1*  --   --  25.9*  --   --   --  24.8*  --   --   INR 3.1*  --   --  2.4*  --   --   --  2.2*  --   --   HEPARINUNFRC  --   --   --  <0.10*  --   --  0.16*  --   --  0.16*  CREATININE 2.69* 3.18*  --  3.52*  --  3.72* 3.85*  --  4.15*  --   TROPONINIHS 4,845* 8,219*  --   --   --  11,507* 14,724*  --   --   --    < > = values in this interval not displayed.     Estimated Creatinine Clearance: 12.9  mL/min (A) (by C-G formula based on SCr of 4.15 mg/dL (H)).   Medical History: Past Medical History:  Diagnosis Date   Anxiety    Arthritis    Asthma    Depression    Diabetes mellitus without complication (HCC)    Fibromyalgia    GERD (gastroesophageal reflux disease)    Hyperlipemia    Hypertension    PONV (postoperative nausea and vomiting)     Medications:   Scheduled:   sodium chloride   Intravenous Once   sodium chloride   Intravenous Once   arformoterol  15 mcg Nebulization BID   budesonide (PULMICORT) nebulizer solution  0.5 mg Nebulization BID   chlorhexidine gluconate (MEDLINE KIT)  15 mL Mouth Rinse BID   Chlorhexidine Gluconate Cloth  6 each  Topical Daily   docusate  100 mg Per Tube BID   fentaNYL (SUBLIMAZE) injection  25 mcg Intravenous Once   lidocaine       mouth rinse  15 mL Mouth Rinse 10 times per day   pantoprazole (PROTONIX) IV  40 mg Intravenous Q12H   polyethylene glycol  17 g Per Tube Daily   vancomycin variable dose per unstable renal function (pharmacist dosing)   Does not apply See admin instructions    Assessment: 71 yo female with saddle PE to start heparin. She is noted with a hematoma around RIJ and blood has been noted from the NG tube. Case discussed with Dr. Carlis Abbott and pharmacy dosing heparin. She is s/p thrombectomy and 4000 units IV heparin given in procedure -hg= 10.6, plt= 122 -INR= 2.2 -fibrinogen= 93 (trend up)  Goal of Therapy:  Heparin level 0.3-0.5 units/ml Monitor platelets by anticoagulation protocol: Yes   Plan:  -Increase heparin to 1000 units/hr -Heparin level in 8 hours and daily wth CBC daily  Hildred Laser, PharmD Clinical Pharmacist **Pharmacist phone directory can now be found on [email or web address] ([email or web address]).  Listed under [place].

## 2021-10-29 NOTE — Progress Notes (Signed)
[place] for heparin Indication: pulmonary embolus  Allergies  Allergen Reactions   Aspirin Nausea And Vomiting    Other reaction(s): Unknown   Benadryl [Diphenhydramine]     Can only take dye free. States the dye causes itching.   Darvon [Propoxyphene] Itching    Can only during the day. Night time makes her itch   Hydrocodone Bit-Homatrop Mbr Itching   Metformin     Other reaction(s): GI   Other     Other reaction(s): Unknown. States reaction was to nasal spray that caused pupils to shrink   Pentazocine     nervous Other reaction(s): jitters   Percocet [Oxycodone-Acetaminophen]     itching   Sulfa Antibiotics Hives   Tetracaine Hcl     Other reaction(s): Unknown. Thinks caused itching.   Tetracyclines & Related Hives   Tramadol     Other reaction(s): itch    Patient Measurements: Height: 5' 5"  (165.1 cm) Weight: 77.1 kg (169 lb 15.6 oz) IBW/kg (Calculated) : 57  Vital Signs: Temp: 98.4 F (36.9 C) (01/02 0400) Temp Source: Bladder (01/02 0400) BP: 122/58 (01/02 0500) Pulse Rate: 101 (01/02 0500)  Labs: Recent Labs    10/28/21 1324 10/28/21 1334 10/28/21 1554 10/28/21 1640 10/28/21 1710 10/28/21 1723 10/28/21 2036 10/29/21 0106 10/29/21 0119 10/29/21 0503 10/29/21 0510  HGB 8.8*   < >  --   --  11.9*   < > 12.1 12.3 12.2 11.7* 12.2  HCT 26.0*   < >  --   --  37.5   < > 38.4 37.8 36.0 35.5* 36.0  PLT  --   --   --   --  197   < > 191   198 204  --  181   188  --   APTT  --   --   --   --   --   --  65*  --   --  PENDING  --   LABPROT  --   --  32.1*  --   --   --  32.1*  --   --  PENDING  --   INR  --   --  3.1*  --   --   --  3.1*  --   --  PENDING  --   HEPARINUNFRC  --   --   --   --   --   --   --   --   --  <0.10*  --   CREATININE 1.10*  --   --   --   --   --  2.69* 3.18*  --   --   --   TROPONINIHS  --   --  1,525*   < > 3,245*  --  4,845* 8,219*  --   --   --    < > = values in this interval not  displayed.     Estimated Creatinine Clearance: 16.4 mL/min (A) (by C-G formula based on SCr of 3.18 mg/dL (H)).   Medical History: Past Medical History:  Diagnosis Date   Anxiety    Arthritis    Asthma    Depression    Diabetes mellitus without complication (HCC)    Fibromyalgia    GERD (gastroesophageal reflux disease)    Hyperlipemia    Hypertension    PONV (postoperative nausea and vomiting)     Medications:  Medications Prior to Admission  Medication Sig Dispense Refill Last Dose  albuterol (PROVENTIL HFA;VENTOLIN HFA) 108 (90 BASE) MCG/ACT inhaler Inhale 1-2 puffs into the lungs every 4 (four) hours as needed for wheezing or shortness of breath.       Alcohol Swabs (B-D SINGLE USE SWABS REGULAR) PADS Apply topically.      ALPRAZolam (XANAX) 0.25 MG tablet Take 0.25 mg by mouth daily as needed for anxiety.      baclofen (LIORESAL) 10 MG tablet TAKE 1 TABLET(10 MG) BY MOUTH TWICE DAILY AS NEEDED FOR MUSCLE SPASMS 60 tablet 2    BD PEN NEEDLE NANO 2ND GEN 32G X 4 MM MISC USE DAILY WITH INSULIN PEN ONCE A DAY      betamethasone dipropionate (DIPROLENE) 0.05 % ointment 1 application to affected area      Blood Glucose Calibration (TRUE METRIX LEVEL 1) Low SOLN       Blood Glucose Monitoring Suppl (TRUE METRIX METER) w/Device KIT       cephALEXin (KEFLEX) 500 MG capsule Take 1 capsule (500 mg total) by mouth 3 (three) times daily. 21 capsule 0    Cholecalciferol (VITAMIN D) 50 MCG (2000 UT) CAPS Take by mouth every other day.      diclofenac Sodium (VOLTAREN) 1 % GEL Apply 2 g topically 4 (four) times daily. Rub into affected area of foot 2 to 4 times daily (Patient not taking: Reported on 06/12/2021) 100 g 2    diphenhydrAMINE HCl (BENADRYL ALLERGY PO)       Elastic Bandages & Supports (MEDICAL COMPRESSION STOCKINGS) MISC 1 each by Does not apply route every 6 (six) hours as needed (swelling). 2 each 0    fluticasone (FLONASE) 50 MCG/ACT nasal spray 2 sprays      gabapentin  (NEURONTIN) 300 MG capsule Take 1 capsule by mouth 2 (two) times daily.      glucose blood test strip See admin instructions.      hydrochlorothiazide (HYDRODIURIL) 12.5 MG tablet Take 12.5 mg by mouth daily.      Insulin Pen Needle (BD PEN NEEDLE NANO U/F) 32G X 4 MM MISC to use daily with insulin pen      LANTUS SOLOSTAR 100 UNIT/ML Solostar Pen Inject 22 Units into the skin at bedtime.      Molnupiravir 200 MG CAPS TAKE 4 CAPSULES BY MOUTH 2 TIMES DAILY FOR 5 DAYS (Patient not taking: Reported on 06/12/2021) 40 capsule 0    OneTouch Delica Lancets 16S MISC Use to check blood sugar once a day for Diabetes; DX Code: E11.69      pantoprazole (PROTONIX) 40 MG tablet Take 1 tablet by mouth 2 (two) times daily.      predniSONE (DELTASONE) 20 MG tablet Take 40 mg by mouth daily. (Patient not taking: Reported on 06/12/2021)      repaglinide (PRANDIN) 2 MG tablet Take 2 mg by mouth 2 (two) times daily before a meal.      simvastatin (ZOCOR) 40 MG tablet Take 1 tablet by mouth daily.      temazepam (RESTORIL) 15 MG capsule TAKE 1 CAPSULE(15 MG) BY MOUTH AT BEDTIME AS NEEDED 30 capsule 0    Scheduled:   sodium chloride   Intravenous Once   sodium chloride   Intravenous Once   arformoterol  15 mcg Nebulization BID   budesonide (PULMICORT) nebulizer solution  0.5 mg Nebulization BID   chlorhexidine gluconate (MEDLINE KIT)  15 mL Mouth Rinse BID   Chlorhexidine Gluconate Cloth  6 each Topical Daily   docusate  100 mg Per Tube  BID   fentaNYL (SUBLIMAZE) injection  25 mcg Intravenous Once   mouth rinse  15 mL Mouth Rinse 10 times per day   pantoprazole (PROTONIX) IV  40 mg Intravenous Q12H   polyethylene glycol  17 g Per Tube Daily   vancomycin variable dose per unstable renal function (pharmacist dosing)   Does not apply See admin instructions    Assessment: 71 yo female with saddle PE to start heparin. She is noted with a hematoma around RIJ and blood has been noted from the NG tube. Case discussed  with Dr. Carlis Abbott and to start heparin with no bolus -INR= 3.1 (vitamin K 48m IV ordered) -hg= 11.0  1/2 AM update:  Heparin level undetectable Still having some oozing-stable from yesterday  Goal of Therapy:  Heparin level 0.3-0.5 units/ml Monitor platelets by anticoagulation protocol: Yes   Plan:  Inc heparin to 800 units/hr 1300 heparin level  JNarda Bonds PharmD, BCPS Clinical Pharmacist Phone: [phone]

## 2021-10-29 NOTE — Progress Notes (Signed)
Date and time results received: 10/29/21 1145 (use smartphrase ".now" to insert current time)  Test: fibrinogen Critical Value: <60  Name of Provider Notified: Dr. Ina Homes  Orders Received? Or Actions Taken?:  MD aware

## 2021-10-29 NOTE — Progress Notes (Signed)
EEG maintenance performed.  No skine breakdown observed at electrode sites Fp1, Fp2, C3, P3.

## 2021-10-29 NOTE — Progress Notes (Signed)
ANTICOAGULATION CONSULT NOTE  ° °Pharmacy Consult for heparin + tirofiban °Indication: pulmonary embolus ° °Allergies  °Allergen Reactions  ° Aspirin Nausea And Vomiting  °  Other reaction(s): Unknown  ° Benadryl [Diphenhydramine]   °  Can only take dye free. States the dye causes itching.  ° Darvon [Propoxyphene] Itching  °  Can only during the day. Night time makes her itch  ° Hydrocodone Bit-Homatrop Mbr Itching  ° Metformin   °  Other reaction(s): GI  ° Other   °  Other reaction(s): Unknown. States reaction was to nasal spray that caused pupils to shrink  ° Pentazocine   °  nervous °Other reaction(s): jitters  ° Percocet [Oxycodone-Acetaminophen]   °  itching  ° Sulfa Antibiotics Hives  ° Tetracaine Hcl   °  Other reaction(s): Unknown. Thinks caused itching.  ° Tetracyclines & Related Hives  ° Tramadol   °  Other reaction(s): itch  ° ° °Patient Measurements: °Height: 5' 5" (165.1 cm) °Weight: 81.1 kg (178 lb 12.7 oz) °IBW/kg (Calculated) : 57 ° °Vital Signs: °Temp: 98.6 °F (37 °C) (01/02 1300) °Temp Source: Bladder (01/02 0700) °BP: 164/71 (01/02 1515) °Pulse Rate: 104 (01/02 1515) ° °Labs: °Recent Labs  °  10/28/21 °1554 10/28/21 °1640 10/28/21 °2036 10/29/21 °0106 10/29/21 °0119 10/29/21 °0503 10/29/21 °0510 10/29/21 °0840 10/29/21 °1240  °HGB  --    < > 12.1 12.3   < > 11.7* 12.2 11.5* 11.2*  °HCT  --    < > 38.4 37.8   < > 35.5* 36.0 33.6* 32.9*  °PLT  --    < > 191   198 204  --  188   181  --  168 156  °APTT  --   --  65*  --   --  49*  --   --   --   °LABPROT 32.1*  --  32.1*  --   --  25.9*  --   --   --   °INR 3.1*  --  3.1*  --   --  2.4*  --   --   --   °HEPARINUNFRC  --   --   --   --   --  <0.10*  --   --  0.16*  °CREATININE  --   --  2.69* 3.18*  --  3.52*  --  3.72* 3.85*  °TROPONINIHS 1,525*   < > 4,845* 8,219*  --   --   --  11,507* 14,724*  ° < > = values in this interval not displayed.  ° ° ° °Estimated Creatinine Clearance: 13.9 mL/min (A) (by C-G formula based on SCr of 3.85 mg/dL  (H)). ° ° °Medical History: °Past Medical History:  °Diagnosis Date  ° Anxiety   ° Arthritis   ° Asthma   ° Depression   ° Diabetes mellitus without complication (HCC)   ° Fibromyalgia   ° GERD (gastroesophageal reflux disease)   ° Hyperlipemia   ° Hypertension   ° PONV (postoperative nausea and vomiting)   ° ° °Medications:  ° °Scheduled:  ° sodium chloride   Intravenous Once  ° sodium chloride   Intravenous Once  ° arformoterol  15 mcg Nebulization BID  ° budesonide (PULMICORT) nebulizer solution  0.5 mg Nebulization BID  ° chlorhexidine gluconate (MEDLINE KIT)  15 mL Mouth Rinse BID  ° Chlorhexidine Gluconate Cloth  6 each Topical Daily  ° docusate  100 mg Per Tube BID  ° fentaNYL (SUBLIMAZE) injection    25 mcg Intravenous Once   lidocaine       mouth rinse  15 mL Mouth Rinse 10 times per day   pantoprazole (PROTONIX) IV  40 mg Intravenous Q12H   polyethylene glycol  17 g Per Tube Daily   vancomycin variable dose per unstable renal function (pharmacist dosing)   Does not apply See admin instructions    Assessment: 71 yo female with saddle PE to start heparin. She is noted with a hematoma around RIJ and blood has been noted from the NG tube. Case discussed with Dr. Carlis Abbott and to start heparin with no bolus -INR= 3.1 (vitamin K 75m IV ordered) -Hg= 11.0  Heparin level this afternoon is below goal at 0.16, but pt currently in IR getting thrombectomy.  Goal of Therapy:  Heparin level 0.3-0.5 units/ml Monitor platelets by anticoagulation protocol: Yes   Plan:  F/u plans for heparin once returns from the procedure.  JNevada Crane PRoylene Reason BCCP Clinical Pharmacist  10/29/2021 3:32 PM   [facility] are listed on [email or web address]

## 2021-10-29 NOTE — Progress Notes (Addendum)
CSW acknowledges consult. Patient not currently oriented.CSW awaiting to hear back from RN to confirm who requested assist with POA paperwork for patient. CSW will continue to follow.

## 2021-10-29 NOTE — Sedation Documentation (Signed)
ACT 173

## 2021-10-29 NOTE — Progress Notes (Signed)
eLink Physician-Brief Progress Note Patient Name: MAKAYLI BRACKEN DOB: May 28, 1951 MRN: [id number]   Date of Service  10/29/2021  HPI/Events of Note  Notified of fibrinogen 93, PTT 198 Previous fibrinogen 60 and she was given a heparin bolus earlier  Additionally PEEP is at 12 after the procedure, no orders in and inquiring if this may be titrated down  eICU Interventions  Will hold off on cryoprecipitate transfusion as no evidence of brisk bleed in the background of DIC Agree with titrating down PEEP as tolerated given hypotension requiring epinephrine and norepinephrine     Intervention Category Intermediate Interventions: Other:  Judd Lien 10/29/2021, 8:33 PM

## 2021-10-29 NOTE — Progress Notes (Signed)
NAME:  [name], MRN:  [id number], DOB:  [birth year], LOS: 1 ADMISSION DATE:  10/28/2021, CONSULTATION DATE: 10/28/21 REFERRING MD: Dr. Sherry Ruffing, CHIEF COMPLAINT:  Cardiac Arrest, VF   History of Present Illness:  [name] presenting with prolonged OOH VF arrest secondary to massive pulmonary embolus complicated by DIC.  Pertinent  Medical History  Anxiety / Depression  Fibromyalgia  Asthma  DM  GERD  HTN  HLD   Significant Hospital Events: Including procedures, antibiotic start and stop dates in addition to other pertinent events   1/1 Admit post VF arrest   Interim History / Subjective:  On vent, sedated.. Pressor requirements better. Postures and bites tongue with sedation lightening.  Objective   Blood pressure (!) 128/47, pulse 97, temperature 98.2 F (36.8 C), temperature source Bladder, resp. rate (!) 30, height 5\' 5"  (1.651 m), weight 81.1 kg, SpO2 99 %.    Vent Mode: PRVC FiO2 (%):  [55 %-100 %] 55 % Set Rate:  [20 bmp-30 bmp] 30 bmp Vt Set:  [440 mL-450 mL] 440 mL PEEP:  [5 cmH20] 5 cmH20 Plateau Pressure:  [12 cmH20-20 cmH20] 17 cmH20   Intake/Output Summary (Last 24 hours) at 10/29/2021 6010 Last data filed at 10/29/2021 0700 Gross per 24 hour  Intake 3198.25 ml  Output 780 ml  Net 2418.25 ml   Filed Weights   10/28/21 1405 10/28/21 1900 10/29/21 0700  Weight: 77.1 kg 77.1 kg 81.1 kg    Examination: Ill appearing woman on vent ETT in place, bit tongue during evaluation and started having brisk bleeding Ext warm Decorticate posturing to pain Has corneal/pupils No doll's eyes Cough/gag intact Lungs clear  Sugars uncontrolled Worsening renal failure ABG looks okay Remains on bicarb gtt Lactate not clearing probably related to epi + shock liver Fibrinogen very low, INR improved  Resolved Hospital Problem list     Assessment & Plan:  OOH prolonged Vfib Arrest Unprovoked massive pulmonary embolism with DIC- ongoing severe bleeding limiting  treatment options.  I do not think she is stable enough for mechanical thrombectomy at present and her hemodynamics are improving.  Will let dust settle and see how does with cautious heparin and supportive care.  Given prolonged OOH arrest, poor neurological exam I do not think she is a good ECMO candidate. Multiorgan failure post arrest- CNS/renal/bone marrow/liver DM II with severe hyperglycemia r/o DKA HTN, HLD, GERD, Anxiety, Depression, Fibromyalgia  Liver lesions NOS- MRI if recovers  - Wean pressors for MAP 65 - Continue heparin  - Normothermia - She is not having life threatening bleeding so will not correct coagulopathy in light of ongoing massive clot burden - Should hemodynamics worsen we can attempt salvage mechanical thrombectomy understanding good chance patient will die on table - Keep heavily sedated for now - As hemodynamics improve we can work on lightening sedation and see where we stand with neurological status - Overall grim prognosis, no good approach here  Best Practice (right click and "Reselect all SmartList Selections" daily)  Diet/type: NPO DVT prophylaxis: SCD GI prophylaxis: PPI Lines: Central line Foley:  N/A Code Status:  full code Last date of multidisciplinary goals of care discussion: 1/1, continue aggressive care for now   Patient critically ill due to shock, resp failure Interventions to address this today vent/pressor titration Risk of deterioration without these interventions is high  I personally spent 41 minutes providing critical care not including any separately billable procedures  Erskine Emery MD [facility]  epic messenger for cross cover needs If after hours, please call E-link

## 2021-10-29 NOTE — Sedation Documentation (Signed)
ACT = 245 

## 2021-10-29 NOTE — Procedures (Signed)
Interventional Radiology Procedure Note  Date of Procedure: 10/29/2021  Procedure: PE thrombectomy   Findings:  1. Successful bilateral PA mechanical/aspiration thrombectomy using the [employer]ari FlowTriever device.  Large volume of acute clot removed. Completion angiogram showed complete resolution of saddle and main PA clot burden with residual subsegmental thrombus.   Complications: No immediate complications noted.   Estimated Blood Loss: minimal  Follow-up and Recommendations: 1. Right groin CFV access with pursestring closure. Strict bedrest until suture removed by IR, likely next morning depending on DIC/coagulopathy.    Albin Felling, MD  Vascular & Interventional Radiology  10/29/2021 4:04 PM

## 2021-10-30 ENCOUNTER — Inpatient Hospital Stay (HOSPITAL_COMMUNITY): Payer: Medicare PPO

## 2021-10-30 DIAGNOSIS — I469 Cardiac arrest, cause unspecified: Secondary | ICD-10-CM | POA: Diagnosis not present

## 2021-10-30 LAB — POCT I-STAT 7, (LYTES, BLD GAS, ICA,H+H)
Acid-Base Excess: 6 mmol/L — ABNORMAL HIGH (ref 0.0–2.0)
Acid-Base Excess: 5 mmol/L — ABNORMAL HIGH (ref 0.0–2.0)
Acid-Base Excess: 1 mmol/L (ref 0.0–2.0)
Acid-base deficit: 8 mmol/L — ABNORMAL HIGH (ref 0.0–2.0)
Bicarbonate: 27.6 mmol/L (ref 20.0–28.0)
Bicarbonate: 25.4 mmol/L (ref 20.0–28.0)
Bicarbonate: 25.8 mmol/L (ref 20.0–28.0)
Bicarbonate: 18.9 mmol/L — ABNORMAL LOW (ref 20.0–28.0)
Calcium, Ion: 0.84 mmol/L — CL (ref 1.15–1.40)
Calcium, Ion: 0.85 mmol/L — CL (ref 1.15–1.40)
Calcium, Ion: 0.88 mmol/L — CL (ref 1.15–1.40)
Calcium, Ion: 0.88 mmol/L — CL (ref 1.15–1.40)
HCT: 26 % — ABNORMAL LOW (ref 36.0–46.0)
HCT: 22 % — ABNORMAL LOW (ref 36.0–46.0)
HCT: 17 % — ABNORMAL LOW (ref 36.0–46.0)
HCT: 34 % — ABNORMAL LOW (ref 36.0–46.0)
Hemoglobin: 8.8 g/dL — ABNORMAL LOW (ref 12.0–15.0)
Hemoglobin: 7.5 g/dL — ABNORMAL LOW (ref 12.0–15.0)
Hemoglobin: 5.8 g/dL — CL (ref 12.0–15.0)
Hemoglobin: 11.6 g/dL — ABNORMAL LOW (ref 12.0–15.0)
O2 Saturation: 99 %
O2 Saturation: 99 %
O2 Saturation: 97 %
O2 Saturation: 90 %
Patient temperature: 36.5
Patient temperature: 37.2
Patient temperature: 37.3
Patient temperature: 36.9
Potassium: 4.9 mmol/L (ref 3.5–5.1)
Potassium: 5.6 mmol/L — ABNORMAL HIGH (ref 3.5–5.1)
Potassium: 5.6 mmol/L — ABNORMAL HIGH (ref 3.5–5.1)
Potassium: 5.7 mmol/L — ABNORMAL HIGH (ref 3.5–5.1)
Sodium: 135 mmol/L (ref 135–145)
Sodium: 133 mmol/L — ABNORMAL LOW (ref 135–145)
Sodium: 132 mmol/L — ABNORMAL LOW (ref 135–145)
Sodium: 133 mmol/L — ABNORMAL LOW (ref 135–145)
TCO2: 28 mmol/L (ref 22–32)
TCO2: 26 mmol/L (ref 22–32)
TCO2: 27 mmol/L (ref 22–32)
TCO2: 20 mmol/L — ABNORMAL LOW (ref 22–32)
pCO2 arterial: 28.1 mmHg — ABNORMAL LOW (ref 32.0–48.0)
pCO2 arterial: 22.7 mmHg — ABNORMAL LOW (ref 32.0–48.0)
pCO2 arterial: 42.7 mmHg (ref 32.0–48.0)
pCO2 arterial: 45.6 mmHg (ref 32.0–48.0)
pH, Arterial: 7.599 — ABNORMAL HIGH (ref 7.350–7.450)
pH, Arterial: 7.657 (ref 7.350–7.450)
pH, Arterial: 7.391 (ref 7.350–7.450)
pH, Arterial: 7.226 — ABNORMAL LOW (ref 7.350–7.450)
pO2, Arterial: 103 mmHg (ref 83.0–108.0)
pO2, Arterial: 124 mmHg — ABNORMAL HIGH (ref 83.0–108.0)
pO2, Arterial: 90 mmHg (ref 83.0–108.0)
pO2, Arterial: 70 mmHg — ABNORMAL LOW (ref 83.0–108.0)

## 2021-10-30 LAB — DIC (DISSEMINATED INTRAVASCULAR COAGULATION)PANEL
D-Dimer, Quant: >20 ug/mL-FEU — ABNORMAL HIGH (ref 0.00–0.50)
D-Dimer, Quant: >20 ug/mL-FEU — ABNORMAL HIGH (ref 0.00–0.50)
D-Dimer, Quant: >20 ug/mL-FEU — ABNORMAL HIGH (ref 0.00–0.50)
D-Dimer, Quant: >20 ug/mL-FEU — ABNORMAL HIGH (ref 0.00–0.50)
D-Dimer, Quant: >20 ug/mL-FEU — ABNORMAL HIGH (ref 0.00–0.50)
Fibrinogen: <60 mg/dL — CL (ref 210–475)
Fibrinogen: <60 mg/dL — CL (ref 210–475)
Fibrinogen: 243 mg/dL (ref 210–475)
Fibrinogen: 339 mg/dL (ref 210–475)
Fibrinogen: 342 mg/dL (ref 210–475)
INR: 3.1 — ABNORMAL HIGH (ref 0.8–1.2)
INR: 2.4 — ABNORMAL HIGH (ref 0.8–1.2)
INR: 1.9 — ABNORMAL HIGH (ref 0.8–1.2)
INR: 2 — ABNORMAL HIGH (ref 0.8–1.2)
INR: 1.8 — ABNORMAL HIGH (ref 0.8–1.2)
Platelets: 191 10*3/uL (ref 150–400)
Platelets: 188 10*3/uL (ref 150–400)
Platelets: 101 10*3/uL — ABNORMAL LOW (ref 150–400)
Platelets: 101 10*3/uL — ABNORMAL LOW (ref 150–400)
Platelets: 84 10*3/uL — ABNORMAL LOW (ref 150–400)
Prothrombin Time: 32.1 seconds — ABNORMAL HIGH (ref 11.4–15.2)
Prothrombin Time: 25.9 seconds — ABNORMAL HIGH (ref 11.4–15.2)
Prothrombin Time: 21.5 seconds — ABNORMAL HIGH (ref 11.4–15.2)
Prothrombin Time: 22.2 seconds — ABNORMAL HIGH (ref 11.4–15.2)
Prothrombin Time: 21 seconds — ABNORMAL HIGH (ref 11.4–15.2)
Smear Review: NONE SEEN
Smear Review: NONE SEEN
Smear Review: NONE SEEN
Smear Review: NONE SEEN
Smear Review: NONE SEEN
aPTT: 65 seconds — ABNORMAL HIGH (ref 24–36)
aPTT: 49 seconds — ABNORMAL HIGH (ref 24–36)
aPTT: 49 seconds — ABNORMAL HIGH (ref 24–36)
aPTT: >200 seconds (ref 24–36)
aPTT: 157 seconds — ABNORMAL HIGH (ref 24–36)

## 2021-10-30 LAB — GLUCOSE, CAPILLARY
Glucose-Capillary: 95 mg/dL (ref 70–99)
Glucose-Capillary: 102 mg/dL — ABNORMAL HIGH (ref 70–99)
Glucose-Capillary: 117 mg/dL — ABNORMAL HIGH (ref 70–99)
Glucose-Capillary: 114 mg/dL — ABNORMAL HIGH (ref 70–99)
Glucose-Capillary: 99 mg/dL (ref 70–99)
Glucose-Capillary: 124 mg/dL — ABNORMAL HIGH (ref 70–99)
Glucose-Capillary: 127 mg/dL — ABNORMAL HIGH (ref 70–99)
Glucose-Capillary: 141 mg/dL — ABNORMAL HIGH (ref 70–99)
Glucose-Capillary: 135 mg/dL — ABNORMAL HIGH (ref 70–99)
Glucose-Capillary: 131 mg/dL — ABNORMAL HIGH (ref 70–99)
Glucose-Capillary: 133 mg/dL — ABNORMAL HIGH (ref 70–99)
Glucose-Capillary: 134 mg/dL — ABNORMAL HIGH (ref 70–99)
Glucose-Capillary: 136 mg/dL — ABNORMAL HIGH (ref 70–99)
Glucose-Capillary: 144 mg/dL — ABNORMAL HIGH (ref 70–99)
Glucose-Capillary: 162 mg/dL — ABNORMAL HIGH (ref 70–99)
Glucose-Capillary: 167 mg/dL — ABNORMAL HIGH (ref 70–99)
Glucose-Capillary: 105 mg/dL — ABNORMAL HIGH (ref 70–99)
Glucose-Capillary: 147 mg/dL — ABNORMAL HIGH (ref 70–99)
Glucose-Capillary: 150 mg/dL — ABNORMAL HIGH (ref 70–99)

## 2021-10-30 LAB — HEPATIC FUNCTION PANEL
ALT: 1540 U/L — ABNORMAL HIGH (ref 0–44)
AST: 2025 U/L — ABNORMAL HIGH (ref 15–41)
Albumin: 1.7 g/dL — ABNORMAL LOW (ref 3.5–5.0)
Alkaline Phosphatase: 92 U/L (ref 38–126)
Bilirubin, Direct: 0.6 mg/dL — ABNORMAL HIGH (ref 0.0–0.2)
Indirect Bilirubin: 0.8 mg/dL (ref 0.3–0.9)
Total Bilirubin: 1.4 mg/dL — ABNORMAL HIGH (ref 0.3–1.2)
Total Protein: 3.9 g/dL — ABNORMAL LOW (ref 6.5–8.1)

## 2021-10-30 LAB — BASIC METABOLIC PANEL
Anion gap: 14 (ref 5–15)
BUN: 41 mg/dL — ABNORMAL HIGH (ref 8–23)
CO2: 25 mmol/L (ref 22–32)
Calcium: 6.5 mg/dL — ABNORMAL LOW (ref 8.9–10.3)
Chloride: 99 mmol/L (ref 98–111)
Creatinine, Ser: 4.75 mg/dL — ABNORMAL HIGH (ref 0.44–1.00)
GFR, Estimated: 9 mL/min — ABNORMAL LOW (ref >60)
Glucose, Bld: 111 mg/dL — ABNORMAL HIGH (ref 70–99)
Potassium: 4.4 mmol/L (ref 3.5–5.1)
Sodium: 138 mmol/L (ref 135–145)

## 2021-10-30 LAB — CBC WITH DIFFERENTIAL/PLATELET
Abs Immature Granulocytes: 0.29 10*3/uL — ABNORMAL HIGH (ref 0.00–0.07)
Abs Immature Granulocytes: 0.45 10*3/uL — ABNORMAL HIGH (ref 0.00–0.07)
Abs Immature Granulocytes: 1.21 10*3/uL — ABNORMAL HIGH (ref 0.00–0.07)
Abs Immature Granulocytes: 1.2 10*3/uL — ABNORMAL HIGH (ref 0.00–0.07)
Basophils Absolute: 0.1 10*3/uL (ref 0.0–0.1)
Basophils Absolute: 0.1 10*3/uL (ref 0.0–0.1)
Basophils Absolute: 0.1 10*3/uL (ref 0.0–0.1)
Basophils Absolute: 0 10*3/uL (ref 0.0–0.1)
Basophils Relative: 0 %
Basophils Relative: 0 %
Basophils Relative: 0 %
Basophils Relative: 0 %
Eosinophils Absolute: 0 10*3/uL (ref 0.0–0.5)
Eosinophils Absolute: 0.1 10*3/uL (ref 0.0–0.5)
Eosinophils Absolute: 0 10*3/uL (ref 0.0–0.5)
Eosinophils Absolute: 0.4 10*3/uL (ref 0.0–0.5)
Eosinophils Relative: 0 %
Eosinophils Relative: 0 %
Eosinophils Relative: 0 %
Eosinophils Relative: 2 %
HCT: 29.3 % — ABNORMAL LOW (ref 36.0–46.0)
HCT: 28.9 % — ABNORMAL LOW (ref 36.0–46.0)
HCT: 27 % — ABNORMAL LOW (ref 36.0–46.0)
HCT: 22.4 % — ABNORMAL LOW (ref 36.0–46.0)
Hemoglobin: 10.5 g/dL — ABNORMAL LOW (ref 12.0–15.0)
Hemoglobin: 9.8 g/dL — ABNORMAL LOW (ref 12.0–15.0)
Hemoglobin: 9.5 g/dL — ABNORMAL LOW (ref 12.0–15.0)
Hemoglobin: 8.3 g/dL — ABNORMAL LOW (ref 12.0–15.0)
Immature Granulocytes: 2 %
Immature Granulocytes: 2 %
Immature Granulocytes: 6 %
Immature Granulocytes: 6 %
Lymphocytes Relative: 11 %
Lymphocytes Relative: 10 %
Lymphocytes Relative: 6 %
Lymphocytes Relative: 6 %
Lymphs Abs: 2.1 10*3/uL (ref 0.7–4.0)
Lymphs Abs: 2.1 10*3/uL (ref 0.7–4.0)
Lymphs Abs: 1.1 10*3/uL (ref 0.7–4.0)
Lymphs Abs: 1.2 10*3/uL (ref 0.7–4.0)
MCH: 27.9 pg (ref 26.0–34.0)
MCH: 26.6 pg (ref 26.0–34.0)
MCH: 27.6 pg (ref 26.0–34.0)
MCH: 28.8 pg (ref 26.0–34.0)
MCHC: 35.8 g/dL (ref 30.0–36.0)
MCHC: 33.9 g/dL (ref 30.0–36.0)
MCHC: 35.2 g/dL (ref 30.0–36.0)
MCHC: 37.1 g/dL — ABNORMAL HIGH (ref 30.0–36.0)
MCV: 77.9 fL — ABNORMAL LOW (ref 80.0–100.0)
MCV: 78.5 fL — ABNORMAL LOW (ref 80.0–100.0)
MCV: 78.5 fL — ABNORMAL LOW (ref 80.0–100.0)
MCV: 77.8 fL — ABNORMAL LOW (ref 80.0–100.0)
Monocytes Absolute: 0.5 10*3/uL (ref 0.1–1.0)
Monocytes Absolute: 0.5 10*3/uL (ref 0.1–1.0)
Monocytes Absolute: 0.5 10*3/uL (ref 0.1–1.0)
Monocytes Absolute: 0.5 10*3/uL (ref 0.1–1.0)
Monocytes Relative: 2 %
Monocytes Relative: 2 %
Monocytes Relative: 2 %
Monocytes Relative: 2 %
Neutro Abs: 16.5 10*3/uL — ABNORMAL HIGH (ref 1.7–7.7)
Neutro Abs: 18.2 10*3/uL — ABNORMAL HIGH (ref 1.7–7.7)
Neutro Abs: 17.3 10*3/uL — ABNORMAL HIGH (ref 1.7–7.7)
Neutro Abs: 17.9 10*3/uL — ABNORMAL HIGH (ref 1.7–7.7)
Neutrophils Relative %: 85 %
Neutrophils Relative %: 86 %
Neutrophils Relative %: 86 %
Neutrophils Relative %: 84 %
Platelets: 101 10*3/uL — ABNORMAL LOW (ref 150–400)
Platelets: 102 10*3/uL — ABNORMAL LOW (ref 150–400)
Platelets: 92 10*3/uL — ABNORMAL LOW (ref 150–400)
Platelets: 100 10*3/uL — ABNORMAL LOW (ref 150–400)
RBC: 3.76 MIL/uL — ABNORMAL LOW (ref 3.87–5.11)
RBC: 3.68 MIL/uL — ABNORMAL LOW (ref 3.87–5.11)
RBC: 3.44 MIL/uL — ABNORMAL LOW (ref 3.87–5.11)
RBC: 2.88 MIL/uL — ABNORMAL LOW (ref 3.87–5.11)
RDW: 14.2 % (ref 11.5–15.5)
RDW: 14.3 % (ref 11.5–15.5)
RDW: 14.7 % (ref 11.5–15.5)
RDW: 14.6 % (ref 11.5–15.5)
Smear Review: DECREASED
Smear Review: DECREASED
WBC: 19.4 10*3/uL — ABNORMAL HIGH (ref 4.0–10.5)
WBC: 21.4 10*3/uL — ABNORMAL HIGH (ref 4.0–10.5)
WBC: 20.2 10*3/uL — ABNORMAL HIGH (ref 4.0–10.5)
WBC: 21.2 10*3/uL — ABNORMAL HIGH (ref 4.0–10.5)
nRBC: 0.1 % (ref 0.0–0.2)
nRBC: 0.1 % (ref 0.0–0.2)
nRBC: 0 % (ref 0.0–0.2)
nRBC: 0 % (ref 0.0–0.2)

## 2021-10-30 LAB — HEMOGLOBIN AND HEMATOCRIT, BLOOD
HCT: 32.5 % — ABNORMAL LOW (ref 36.0–46.0)
Hemoglobin: 11.2 g/dL — ABNORMAL LOW (ref 12.0–15.0)

## 2021-10-30 LAB — BRAIN NATRIURETIC PEPTIDE
B Natriuretic Peptide: 877.3 pg/mL — ABNORMAL HIGH (ref 0.0–100.0)
B Natriuretic Peptide: 617.6 pg/mL — ABNORMAL HIGH (ref 0.0–100.0)

## 2021-10-30 LAB — PHOSPHORUS
Phosphorus: 2.3 mg/dL — ABNORMAL LOW (ref 2.5–4.6)
Phosphorus: 2.5 mg/dL (ref 2.5–4.6)

## 2021-10-30 LAB — MASSIVE TRANSFUSION PROTOCOL ORDER (BLOOD BANK NOTIFICATION)

## 2021-10-30 LAB — CBC
HCT: 18.9 % — ABNORMAL LOW (ref 36.0–46.0)
Hemoglobin: 6.9 g/dL — CL (ref 12.0–15.0)
MCH: 29.5 pg (ref 26.0–34.0)
MCHC: 36.5 g/dL — ABNORMAL HIGH (ref 30.0–36.0)
MCV: 80.8 fL (ref 80.0–100.0)
Platelets: 101 10*3/uL — ABNORMAL LOW (ref 150–400)
RBC: 2.34 MIL/uL — ABNORMAL LOW (ref 3.87–5.11)
RDW: 14.9 % (ref 11.5–15.5)
WBC: 24.6 10*3/uL — ABNORMAL HIGH (ref 4.0–10.5)
nRBC: 0.2 % (ref 0.0–0.2)

## 2021-10-30 LAB — MAGNESIUM
Magnesium: 1.6 mg/dL — ABNORMAL LOW (ref 1.7–2.4)
Magnesium: 1.9 mg/dL (ref 1.7–2.4)

## 2021-10-30 LAB — LACTIC ACID, PLASMA
Lactic Acid, Venous: 4 mmol/L (ref 0.5–1.9)
Lactic Acid, Venous: 3.4 mmol/L (ref 0.5–1.9)
Lactic Acid, Venous: 7.4 mmol/L (ref 0.5–1.9)

## 2021-10-30 LAB — PROCALCITONIN: Procalcitonin: >150 ng/mL

## 2021-10-30 LAB — POTASSIUM: Potassium: 5.8 mmol/L — ABNORMAL HIGH (ref 3.5–5.1)

## 2021-10-30 LAB — PREPARE RBC (CROSSMATCH)

## 2021-10-30 LAB — VANCOMYCIN, RANDOM: Vancomycin Rm: 21

## 2021-10-30 LAB — HEPARIN LEVEL (UNFRACTIONATED)
Heparin Unfractionated: <0.1 IU/mL — ABNORMAL LOW (ref 0.30–0.70)
Heparin Unfractionated: 0.74 IU/mL — ABNORMAL HIGH (ref 0.30–0.70)

## 2021-10-30 MED ORDER — VANCOMYCIN HCL 750 MG/150ML IV SOLN
750.0000 mg | Freq: Once | INTRAVENOUS | Status: AC
  Administered 2021-10-30: 750 mg via INTRAVENOUS
  Filled 2021-10-30: qty 150

## 2021-10-30 MED ORDER — CALCIUM GLUCONATE-NACL 2-0.675 GM/100ML-% IV SOLN
2.0000 g | Freq: Once | INTRAVENOUS | Status: AC
  Administered 2021-10-30: 2000 mg via INTRAVENOUS
  Filled 2021-10-30: qty 100

## 2021-10-30 MED ORDER — MAGNESIUM SULFATE IN D5W 1-5 GM/100ML-% IV SOLN
1.0000 g | Freq: Once | INTRAVENOUS | Status: AC
  Administered 2021-10-30: 1 g via INTRAVENOUS
  Filled 2021-10-30: qty 100

## 2021-10-30 MED ORDER — SODIUM CHLORIDE 0.9% IV SOLUTION: Freq: Once | INTRAVENOUS | Status: DC

## 2021-10-30 MED ORDER — NOREPINEPHRINE 16 MG/250ML-% IV SOLN
0.0000 ug/min | INTRAVENOUS | Status: DC
  Administered 2021-10-30: 2 ug/min via INTRAVENOUS
  Administered 2021-10-30: 40 ug/min via INTRAVENOUS
  Administered 2021-10-31: 26 ug/min via INTRAVENOUS
  Administered 2021-10-31 (×2): 40 ug/min via INTRAVENOUS
  Administered 2021-10-31: 20.053 ug/min via INTRAVENOUS
  Administered 2021-11-01: 24 ug/min via INTRAVENOUS
  Administered 2021-11-03: 2 ug/min via INTRAVENOUS
  Filled 2021-10-30 (×7): qty 250

## 2021-10-30 MED ORDER — SODIUM CHLORIDE 0.9% FLUSH
10.0000 mL | Freq: Two times a day (BID) | INTRAVENOUS | Status: DC
  Administered 2021-10-30 – 2021-11-05 (×9): 10 mL

## 2021-10-30 MED ORDER — SODIUM ZIRCONIUM CYCLOSILICATE 10 G PO PACK
10.0000 g | PACK | Freq: Once | ORAL | Status: AC
  Administered 2021-10-30: 10 g
  Filled 2021-10-30: qty 1

## 2021-10-30 MED ORDER — HYDROCORTISONE SOD SUC (PF) 100 MG IJ SOLR
100.0000 mg | Freq: Two times a day (BID) | INTRAMUSCULAR | Status: DC
  Administered 2021-10-30 – 2021-11-04 (×11): 100 mg via INTRAVENOUS
  Filled 2021-10-30 (×11): qty 2

## 2021-10-30 MED ORDER — VASOPRESSIN 20 UNITS/100 ML INFUSION FOR SHOCK
0.0000 [IU]/min | INTRAVENOUS | Status: DC
  Administered 2021-10-30 (×3): 0.03 [IU]/min via INTRAVENOUS
  Administered 2021-10-31 – 2021-11-02 (×6): 0.04 [IU]/min via INTRAVENOUS
  Administered 2021-11-02: 0.02 [IU]/min via INTRAVENOUS
  Filled 2021-10-30 (×10): qty 100

## 2021-10-30 MED ORDER — VITAL AF 1.2 CAL PO LIQD
1000.0000 mL | ORAL | Status: DC
  Administered 2021-10-30 – 2021-11-01 (×2): 1000 mL

## 2021-10-30 MED ORDER — MICROFIBRILLAR COLL HEMOSTAT EX POWD
1.0000 g | CUTANEOUS | Status: AC
Start: 2021-10-30 — End: 2021-10-31
  Filled 2021-10-30: qty 5

## 2021-10-30 MED ORDER — SODIUM CHLORIDE 0.9% FLUSH: 10.0000 mL | INTRAVENOUS | Status: DC | PRN

## 2021-10-30 NOTE — Progress Notes (Signed)
Date and time results received: 10/30/21 1833  Test: H&H Critical Value: Hgb 6.9  Name of Provider Notified: Lenice Llamas MD  Orders Received? Or Actions Taken?:  2 units pRBCs already ordered by MD, with 1st unit running. Additional unit pRBC ordered for total of 3 pRBCs.

## 2021-10-30 NOTE — Progress Notes (Signed)
RT transported pt to and from CT without event. 

## 2021-10-30 NOTE — Progress Notes (Signed)
eLink Physician-Brief Progress Note Patient Name: [name] DOB: 23-Jan-1951 MRN: [id number]   Date of Service  10/30/2021  HPI/Events of Note  Notified of Mg 1.6, Phos 2.3, Cr 4.75, K 4.4  eICU Interventions  Ordered Magnesium 1 g IVPB Will defer phos replacement for now given worsening renal function     Intervention Category Intermediate Interventions: Electrolyte abnormality - evaluation and management  Judd Lien 10/30/2021, 5:42 AM

## 2021-10-30 NOTE — Progress Notes (Signed)
EEG maintenance performed.

## 2021-10-30 NOTE — Progress Notes (Signed)
Blood is okay to be administered emergently with rate at 977ml/hr per Dr Shearon Stalls.

## 2021-10-30 NOTE — Progress Notes (Signed)
Pharmacy Antibiotic Note  [name] is a 71 y.o. female admitted on 10/28/2021 with sepsis.  Pharmacy has been consulted for vancomycin and cefepime dosing. Scr has increased from 1.1 on admit to 4.75 48h later. Vancomycin random level was ordered and was 21 about 40 hours after loading dose of 1500 mg.   Plan: Continue cefepime 2 g q24h Give vancomycin 750 mg once Order another vancomycin random as clinically indicated Continue to monitor renal function for dose adjustments, narrowing of antibiotics, and clinical status  Height: 5\' 5"  (165.1 cm) Weight: 81.1 kg (178 lb 12.7 oz) IBW/kg (Calculated) : 57  Temp (24hrs), Avg:98.9 F (37.2 C), Min:97.4 F (36.3 C), Max:99.9 F (37.7 C)  Recent Labs  Lab 10/29/21 0106 10/29/21 0503 10/29/21 0840 10/29/21 1205 10/29/21 1240 10/29/21 1646 10/29/21 1949 10/30/21 0100 10/30/21 0400 10/30/21 0938 10/30/21 1057  WBC 26.8* 24.5* 20.8*  --  19.7* 18.3* 20.5* 19.4* 21.4* 20.2*  --   CREATININE 3.18* 3.52* 3.72*  --  3.85*  --  4.15*  --  4.75*  --   --   LATICACIDVEN >9.0* >9.0* 8.1* 5.0*  --   --   --   --   --  4.0*  --   VANCORANDOM  --   --   --   --   --   --   --   --   --   --  21    Estimated Creatinine Clearance: 11.3 mL/min (A) (by C-G formula based on SCr of 4.75 mg/dL (H)).    Allergies  Allergen Reactions   Aspirin Nausea And Vomiting    Other reaction(s): Unknown   Benadryl [Diphenhydramine]     Can only take dye free. States the dye causes itching.   Darvon [Propoxyphene] Itching    Can only during the day. Night time makes her itch   Hydrocodone Bit-Homatrop Mbr Itching   Metformin     Other reaction(s): GI   Other     Other reaction(s): Unknown. States reaction was to nasal spray that caused pupils to shrink   Pentazocine     nervous Other reaction(s): jitters   Percocet [Oxycodone-Acetaminophen]     itching   Sulfa Antibiotics Hives   Tetracaine Hcl     Other reaction(s): Unknown. Thinks caused  itching.   Tetracyclines & Related Hives   Tramadol     Other reaction(s): itch    Antimicrobials this admission: Vancomycin 1/1 >>  Cefepime 1/1 >>  Dose adjustments this admission: Cefepime 2 g IV q8h > q12h > q24h  Microbiology results: 1/3 BCx: Reordered and sent (1/1 Bcx cancelled by lab) 1/3 Resp Cx: sent  1/1 MRSA PCR: not detected   Thank you for allowing pharmacy to be a part of this patients care.  Varney Daily, PharmD PGY1 Pharmacy Resident  Please check AMION for all [facility] pharmacy phone numbers After 10:00 PM call main pharmacy [phone]

## 2021-10-30 NOTE — Progress Notes (Signed)
eLink Physician-Brief Progress Note Patient Name: [name] DOB: 1951-05-03 MRN: [id number]   Date of Service  10/30/2021  HPI/Events of Note  Increasing pressor requirement. On Bicarb drip at 100 ml/hr. Report of oozing on several sites but H/H remains stable at 10.5/29.3  eICU Interventions  Will start stress dose steroids     Intervention Category Intermediate Interventions: Hypotension - evaluation and management  Judd Lien 10/30/2021, 3:19 AM

## 2021-10-30 NOTE — Procedures (Addendum)
Patient Name: SHAMYA MACFADDEN  MRN: [id number]  Epilepsy Attending: Lora Havens  Referring Physician/Provider: Donita Brooks, NP Duration: 10/29/2021 1920 to 10/30/2021 1920   Patient history: 71yo F s/p cardiac arrest. EEG to evaluate for seizure   Level of alertness: lethargic   AEDs during EEG study: Propofol  Technical aspects: This EEG study was done with scalp electrodes positioned according to the 10-20 International system of electrode placement. Electrical activity was acquired at a sampling rate of 500Hz  and reviewed with a high frequency filter of 70Hz  and a low frequency filter of 1Hz . EEG data were recorded continuously and digitally stored.    Description: EEG showed near continuous generalized low amplitude 3 to 6 Hz theta-delta slowing. Hyperventilation and photic stimulation were not performed.       ABNORMALITY - Continuous slow, generalized   IMPRESSION: This study is suggestive of severe diffuse encephalopathy, nonspecific etiology. No seizures or epileptiform discharges were seen throughout the recording.   Ethyn Schetter Barbra Sarks

## 2021-10-30 NOTE — Progress Notes (Signed)
eLink Physician-Brief Progress Note Patient Name: [name] DOB: [birth year] MRN: [id number]   Date of Service  10/30/2021  HPI/Events of Note  Patient hypotensive despite transfusion of 3 units PRBC and maxed out on three pressors, CT abdomen / pelvis shows large intra-peritoneal hematoma and evidence of active bleeding per radiologist.  eICU Interventions  Massive Transfusion protocol triggered, bedside RN asked to transfuse 3 units PRBC and 3 units FFP stat, and also two place two additional large bore PIV's, Dr. Louanna Raw of general surgery paged and stat consulted, he will see the patient.        [name] 10/30/2021, 9:35 PM

## 2021-10-30 NOTE — Progress Notes (Signed)
Referring Physician(s): Dr. Tamala Julian   Supervising Physician: Juliet Rude  Patient Status:  [name] - In-pt  Chief Complaint: Saddle pulmonary embolism s/p catheter-directed thrombectomy in IR 10/29/21  Subjective: Patient intubated/sedated. Hypothermia protocol in progress.Patient's daughter is at the bedside.   Allergies: Aspirin, Benadryl [diphenhydramine], Darvon [propoxyphene], Hydrocodone bit-homatrop mbr, Metformin, Other, Pentazocine, Percocet [oxycodone-acetaminophen], Sulfa antibiotics, Tetracaine hcl, Tetracyclines & related, and Tramadol  Medications: Prior to Admission medications   Medication Sig Start Date End Date Taking? Authorizing Provider  acetaminophen (TYLENOL) 500 MG tablet Take 500-1,000 mg by mouth every 6 (six) hours as needed for mild pain or headache.    [provider]  albuterol (ACCUNEB) 1.25 MG/3ML nebulizer solution Take 1.25 mg by nebulization every 8 (eight) hours as needed for wheezing or shortness of breath. 07/04/21   [provider]  albuterol (PROVENTIL HFA;VENTOLIN HFA) 108 (90 BASE) MCG/ACT inhaler Inhale 1-2 puffs into the lungs every 4 (four) hours as needed for wheezing or shortness of breath.     [provider]  ALPRAZolam Duanne Moron) 0.25 MG tablet Take 0.25 mg by mouth daily as needed for anxiety. 02/19/19   [provider]  baclofen (LIORESAL) 10 MG tablet TAKE 1 TABLET(10 MG) BY MOUTH TWICE DAILY AS NEEDED FOR MUSCLE SPASMS Patient taking differently: Take 10 mg by mouth 2 (two) times daily as needed for muscle spasms. 06/15/21   Bo Merino, MD  betamethasone dipropionate (DIPROLENE) 0.05 % ointment 1 application to affected area 03/15/15   [provider]  diclofenac Sodium (VOLTAREN) 1 % GEL Apply 4 g topically daily as needed (pain).    [provider]  diphenhydrAMINE HCl (BENADRYL ALLERGY PO)     [provider]  Elastic Bandages & Supports (MEDICAL COMPRESSION STOCKINGS)  [place] 1 each by Does not apply route every 6 (six) hours as needed (swelling). 04/28/19   Wieters, Hallie C, PA-C  fluticasone (FLONASE) 50 MCG/ACT nasal spray Place 2 sprays into both nostrils daily. 08/07/10   [provider]  gabapentin (NEURONTIN) 300 MG capsule Take 300 mg by mouth 2 (two) times daily.    [provider]  hydrochlorothiazide (HYDRODIURIL) 12.5 MG tablet Take 12.5 mg by mouth daily. 02/10/19   [provider]  LANTUS SOLOSTAR 100 UNIT/ML Solostar Pen Inject 22 Units into the skin at bedtime. 12/09/18   [provider]  pantoprazole (PROTONIX) 40 MG tablet Take 40 mg by mouth 2 (two) times daily.    [provider]  repaglinide (PRANDIN) 2 MG tablet Take 2 mg by mouth 2 (two) times daily before a meal.    [provider]  simvastatin (ZOCOR) 40 MG tablet Take 40 mg by mouth daily.    [provider]  temazepam (RESTORIL) 15 MG capsule TAKE 1 CAPSULE(15 MG) BY MOUTH AT BEDTIME AS NEEDED Patient taking differently: Take 15 mg by mouth at bedtime as needed for sleep. 10/15/21   Ofilia Neas, PA-C  Vitamin D, Ergocalciferol, (DRISDOL) 1.25 MG (50000 UNIT) CAPS capsule Take 50,000 Units by mouth once a week. 10/08/21   [provider]     Vital Signs: BP (!) 149/53 (BP Location: Left Arm)    Pulse 96    Temp 98.4 F (36.9 C) (Bladder)    Resp (!) 30    Ht 5\' 5"  (1.651 m)    Wt 178 lb 12.7 oz (81.1 kg)    SpO2 96%    BMI 29.75 kg/m   Physical Exam Constitutional:  Comments: Intubated/sedated. Therapeutic hypothermia protocol/[place] in progress.   Cardiovascular:     Rate and Rhythm: Normal rate.     Comments: Right groin vascular site with purse-string suture. No signs of bleeding/hematoma. Site is clean, soft and dry. Suture cut, site covered with gauze/tape.  Pulmonary:     Comments: Intubated/ventilator  Neurological:     Comments: EEG in progress.     Imaging: CT HEAD WO CONTRAST  (5MM)  Result Date: 10/28/2021 CLINICAL DATA:  Mental status change, unknown cause EXAM: CT HEAD WITHOUT CONTRAST TECHNIQUE: Contiguous axial images were obtained from the base of the skull through the vertex without intravenous contrast. COMPARISON:  None. FINDINGS: Brain: No evidence of large-territorial acute infarction. No parenchymal hemorrhage. No mass lesion. No extra-axial collection. No mass effect or midline shift. No hydrocephalus. Basilar cisterns are patent. Vascular: No hyperdense vessel. Skull: No acute fracture or focal lesion. Sinuses/Orbits: Mucosal thickening within bilateral nasal cavities. Paranasal sinuses and mastoid air cells are clear. The orbits are unremarkable. Other: None. IMPRESSION: No acute intracranial abnormality. Electronically Signed   By: Iven Finn M.D.   On: 10/28/2021 17:31   CT Angio Chest Pulmonary Embolism (PE) W or WO Contrast  Result Date: 10/28/2021 CLINICAL DATA:  Pulmonary embolism (PE) suspected, high prob echo with RV strain EXAM: CT ANGIOGRAPHY CHEST WITH CONTRAST TECHNIQUE: Multidetector CT imaging of the chest was performed using the standard protocol during bolus administration of intravenous contrast. Multiplanar CT image reconstructions and MIPs were obtained to evaluate the vascular anatomy. CONTRAST:  18mL OMNIPAQUE IOHEXOL 350 MG/ML SOLN COMPARISON:  None. FINDINGS: Lines and tubes: Right mainstem bronchus intubation with tip terminating just distal to the carina. Enteric tube coursing below the hemidiaphragm with tip within the gastric lumen. There is a 5.2 cm density within the right lower neck that is partially visualized that may be related to the insertion site of a right internal jugular central venous catheter with tip terminating in the right atrium. Cardiovascular: Satisfactory opacification of the pulmonary arteries to the segmental level. Saddle pulmonary embolus extending to the segmental and subsegmental levels in the left lower lobe in  all three right lobes. Associated increased right to left ventricular ratio 1.9. No significant pericardial effusion. The thoracic aorta is normal in caliber. No atherosclerotic plaque of the thoracic aorta. No coronary artery calcifications. Mediastinum/Nodes: No definite pneumomediastinum. No enlarged mediastinal, hilar, or axillary lymph nodes. Thyroid gland, trachea, and esophagus demonstrate no significant findings. Lungs/Pleura: Bilateral lower lobe subsegmental atelectasis. Lingular subsegmental atelectasis. No pulmonary nodule. No pulmonary mass. No pleural effusion. No pneumothorax. Upper Abdomen: Query partially visualized right upper mid abdomen fat stranding. Musculoskeletal: No suspicious lytic or blastic osseous lesions. Acute minimally displaced left anterior fourth and fifth rib fractures. Acute nondisplaced mid sternal body fracture. Likely old nondisplaced left posterior rib fractures. Multilevel degenerative changes of the spine. Review of the MIP images confirms the above findings. IMPRESSION: 1. Saddle pulmonary embolus extending to the segmental and subsegmental levels in the left lower lobe and in all three right lobes. Associated right heart strain. No definite pulmonary infarction. Positive for acute PE with CT evidence of right heart strain (RV/LV Ratio = 1.9) consistent with at least submassive (intermediate risk) PE. The presence of right heart strain has been associated with an increased risk of morbidity and mortality. Please refer to the "PE Focused" order set in EPIC. 2. Right mainstem bronchus intubation with tip terminating just distal to the carina. Recommend retracting by 3 cm. 3. A  5.2 cm density within the right lower neck that is partially visualized that may be related to a hematoma along the insertion site of a right internal jugular central venous catheter with tip terminating in the right atrium. Limited evaluation due to timing of contrast. 4. Acute minimally displaced  left anterior 4th and 5th rib fractures. 5.  Acute nondisplaced mid sternal body fracture. 6. Query partially visualized right upper mid abdomen fat stranding. Concern for underlying mass or inflammation. Recommend CT abdomen pelvis with contrast for further evaluation. 7. Bilateral lower lobe and lingular consolidation may represent atelectasis with superimposed infection/inflammation (such as aspiration pneumonia) not excluded. These results were called by telephone at the time of interpretation on 10/28/2021 at 5:17 pm to provider Dr. Ernest Mallick , who verbally acknowledged these results. Electronically Signed   By: Iven Finn M.D.   On: 10/28/2021 17:30   CT ABDOMEN PELVIS W CONTRAST  Result Date: 10/28/2021 CLINICAL DATA:  Anemia. EXAM: CT ABDOMEN AND PELVIS WITH CONTRAST TECHNIQUE: Multidetector CT imaging of the abdomen and pelvis was performed using the standard protocol following bolus administration of intravenous contrast. CONTRAST:  122mL OMNIPAQUE IOHEXOL 300 MG/ML  SOLN COMPARISON:  CT scan of same day. FINDINGS: Lower chest: Left upper lobe atelectasis or pneumonia is noted. Mild posterior basilar subsegmental atelectasis is noted. Pulmonary embolus is noted in right pulmonary artery as noted on prior CT scan of same day. Hepatobiliary: No gallstones or biliary dilatation is noted. Large ill-defined low density is noted in left hepatic lobe measuring 3.7 x 2.4 cm. Mild amount of fluid is noted in the gallbladder fossa and in Morison's pouch concerning for possible cholecystitis. Pancreas: Unremarkable. No pancreatic ductal dilatation or surrounding inflammatory changes. Spleen: Normal in size without focal abnormality. Adrenals/Urinary Tract: Adrenal glands and kidneys appear normal. Hydronephrosis or renal obstruction is noted. Urinary bladder is decompressed secondary to Foley catheter. Stomach/Bowel: Stomach and appendix are unremarkable. There is no evidence of bowel obstruction. Colon is  unremarkable. Fluid-filled small bowel loops are noted with minimal wall and fold thickening and enhancement suggesting mild enteritis. Vascular/Lymphatic: Aortic atherosclerosis. No enlarged abdominal or pelvic lymph nodes. Reproductive: Uterus and bilateral adnexa are unremarkable. Other: No abdominal wall hernia or abnormality. No abdominopelvic ascites. Musculoskeletal: No acute or significant osseous findings. IMPRESSION: Mild amount of fluid is noted in the gallbladder fossa and in Morrison's pouch which may represent cholecystitis. No cholelithiasis is noted. Ultrasound is recommended for further evaluation. Ill-defined 3.7 x 2.4 cm low density is noted in the left hepatic lobe of uncertain etiology. When the patient is clinically stable and able to follow directions and hold their breath (preferably as an outpatient) further evaluation with dedicated abdominal MRI should be considered. Fluid-filled small bowel loops are noted with minimal wall and fold thickening and enhancement suggesting enteritis. Aortic Atherosclerosis (ICD10-I70.0). Electronically Signed   By: Marijo Conception M.D.   On: 10/28/2021 18:46   IR THROMBECT PRIM MECH INIT (INCLU) MOD SED  Result Date: 10/29/2021 INDICATION: High-risk pulmonary embolism with hemodynamic instability requiring pressor support, bilateral pulmonary embolism with saddle component EXAM: 1. Ultrasound-guided puncture of the right common femoral vein 2. Catheterization of the main and right pulmonary arteries with bilateral pulmonary artery angiogram 3. Mechanical/aspiration thrombectomy of the right and left pulmonary arteries COMPARISON:  CTA chest previous day MEDICATIONS: None. ANESTHESIA/SEDATION: Sedation provided by the critical care team. Refer to separate critical care records. FLUOROSCOPY TIME:  FLUOROSCOPY TIME Fluoroscopy Time: 12.3 minutes with 4 exposures COMPLICATIONS: None  immediate. TECHNIQUE: Informed written consent was obtained from the patient  after a thorough discussion of the procedural risks, benefits and alternatives. All questions were addressed. Maximal Sterile Barrier Technique was utilized including caps, mask, sterile gowns, sterile gloves, sterile drape, hand hygiene and skin antiseptic. A timeout was performed prior to the initiation of the procedure. The patient was placed supine on the exam table. Ultrasound of the right groin was performed. This demonstrated a widely patent right common femoral vein. A permanent image was stored in the electronic medical record. Using ultrasound guidance, the right common femoral vein was directly punctured with a 21 gauge micropuncture set with visualization of needle entry into the vessel lumen. Wire was advanced centrally into the IVC, followed by serial tract dilation to accommodate a short 6 [place] sheath. Initial attempts to catheterize the main pulmonary artery using combination of an Omni Flush catheter and Rosen guidewire were unsuccessful. The sheath was exchanged for a long 7 [place] sheath, and the pulmonary artery was catheterized using a combination of a single angled pigtail catheter and Rosen guidewire. Wire and catheter were directed into the right main pulmonary artery. Wire was exchanged for a short tapered stiff Amplatz wire. Over this wire, the access site was serially dilated to accommodate a 24 [place] sheath. Next, the Inari 24 [place] FlowTriever aspiration thrombectomy catheter was then advanced into the distal aspect of the right main pulmonary artery. Aspiration thrombectomy of the right main pulmonary artery was performed. Large volume of acute appearing clot was removed. The aspiration thrombectomy catheter was slowly withdrawn into the main pulmonary artery/RVOT, and thrombectomy of the saddle component and left pulmonary artery were performed, with removal of additional acute appearing thrombus. With the aspiration thrombectomy catheter positioned within the main pulmonary  artery, bilateral pulmonary artery angiogram was performed. Bilateral pulmonary artery angiogram demonstrates complete resolution of the saddle pulmonary embolism in clot burden within the main pulmonary arteries. There remains scattered residual small volume subsegmental embolism. The patient's clinical parameters had improved with reduction in vasopressor requirement and improvement in blood pressure at the end of the procedure. Given this improvement and no further large volume embolism identified on completion angiography, the procedure was stopped at this point. All catheters and wires were removed. Hemostasis was achieved at the access site using a pursestring suture. A clean dressing was placed. The patient tolerated the procedure well without immediate complication. FINDINGS: As above. IMPRESSION: Successful mechanical/aspiration thrombectomy of high-risk bilateral pulmonary embolism using the Inari FlowTriever aspiration thrombectomy catheter. Completion angiography demonstrated near-complete resolution of the saddle component and clot burden within the main pulmonary arteries. Electronically Signed   By: Albin Felling M.D.   On: 10/29/2021 16:33   IR US Guide Vasc Access Right  Result Date: 10/29/2021 INDICATION: High-risk pulmonary embolism with hemodynamic instability requiring pressor support, bilateral pulmonary embolism with saddle component EXAM: 1. Ultrasound-guided puncture of the right common femoral vein 2. Catheterization of the main and right pulmonary arteries with bilateral pulmonary artery angiogram 3. Mechanical/aspiration thrombectomy of the right and left pulmonary arteries COMPARISON:  CTA chest previous day MEDICATIONS: None. ANESTHESIA/SEDATION: Sedation provided by the critical care team. Refer to separate critical care records. FLUOROSCOPY TIME:  FLUOROSCOPY TIME Fluoroscopy Time: 12.3 minutes with 4 exposures COMPLICATIONS: None immediate. TECHNIQUE: Informed written consent was  obtained from the patient after a thorough discussion of the procedural risks, benefits and alternatives. All questions were addressed. Maximal Sterile Barrier Technique was utilized including caps, mask, sterile gowns, sterile gloves, sterile drape,  hand hygiene and skin antiseptic. A timeout was performed prior to the initiation of the procedure. The patient was placed supine on the exam table. Ultrasound of the right groin was performed. This demonstrated a widely patent right common femoral vein. A permanent image was stored in the electronic medical record. Using ultrasound guidance, the right common femoral vein was directly punctured with a 21 gauge micropuncture set with visualization of needle entry into the vessel lumen. Wire was advanced centrally into the IVC, followed by serial tract dilation to accommodate a short 6 [place] sheath. Initial attempts to catheterize the main pulmonary artery using combination of an Omni Flush catheter and Rosen guidewire were unsuccessful. The sheath was exchanged for a long 7 [place] sheath, and the pulmonary artery was catheterized using a combination of a single angled pigtail catheter and Rosen guidewire. Wire and catheter were directed into the right main pulmonary artery. Wire was exchanged for a short tapered stiff Amplatz wire. Over this wire, the access site was serially dilated to accommodate a 24 [place] sheath. Next, the Inari 24 [place] FlowTriever aspiration thrombectomy catheter was then advanced into the distal aspect of the right main pulmonary artery. Aspiration thrombectomy of the right main pulmonary artery was performed. Large volume of acute appearing clot was removed. The aspiration thrombectomy catheter was slowly withdrawn into the main pulmonary artery/RVOT, and thrombectomy of the saddle component and left pulmonary artery were performed, with removal of additional acute appearing thrombus. With the aspiration thrombectomy catheter positioned within  the main pulmonary artery, bilateral pulmonary artery angiogram was performed. Bilateral pulmonary artery angiogram demonstrates complete resolution of the saddle pulmonary embolism in clot burden within the main pulmonary arteries. There remains scattered residual small volume subsegmental embolism. The patient's clinical parameters had improved with reduction in vasopressor requirement and improvement in blood pressure at the end of the procedure. Given this improvement and no further large volume embolism identified on completion angiography, the procedure was stopped at this point. All catheters and wires were removed. Hemostasis was achieved at the access site using a pursestring suture. A clean dressing was placed. The patient tolerated the procedure well without immediate complication. FINDINGS: As above. IMPRESSION: Successful mechanical/aspiration thrombectomy of high-risk bilateral pulmonary embolism using the Inari FlowTriever aspiration thrombectomy catheter. Completion angiography demonstrated near-complete resolution of the saddle component and clot burden within the main pulmonary arteries. Electronically Signed   By: Albin Felling M.D.   On: 10/29/2021 16:33   DG Chest Port 1 View  Result Date: 10/29/2021 CLINICAL DATA:  Respiratory failure. EXAM: PORTABLE CHEST 1 VIEW COMPARISON:  10/28/2021. FINDINGS: ET tube tip is above the carina. Nasogastric tube tip and side port are in the gastric fundus. Right IJ catheter tip is in the right atrium. Stable cardiomediastinal contours. Asymmetric opacification within the left midlung and left lower lung appears increased from the previous exam. Right lung remains clear. IMPRESSION: 1. Worsening aeration to the left midlung and left lower lung. 2. Stable support apparatus. Electronically Signed   By: Kerby Moors M.D.   On: 10/29/2021 07:07   DG CHEST PORT 1 VIEW  Result Date: 10/28/2021 CLINICAL DATA:  Post CPR.  Central line placement. EXAM: PORTABLE  CHEST 1 VIEW COMPARISON:  10/28/2021 at 1:15 p.m. FINDINGS: Right internal jugular central venous line has its tip in the right atrium. No pneumothorax. No other change from the earlier study. IMPRESSION: 1. Right IJ central venous line has its tip in the right atrium. No pneumothorax. Electronically Signed  By: Lajean Manes M.D.   On: 10/28/2021 14:52   DG Chest Portable 1 View  Result Date: 10/28/2021 CLINICAL DATA:  Status post cardiac arrest and intubation. EXAM: PORTABLE CHEST 1 VIEW COMPARISON:  10/23/2020. FINDINGS: Endotracheal tube tip projects 1 cm above the carinal. Nasal/orogastric tube passes well below the diaphragm into the stomach. Cardiac silhouette normal in size. Normal mediastinal and hilar contours. Clear lungs. IMPRESSION: 1. Well-positioned endotracheal tube and nasal/orogastric tube. 2. No acute cardiopulmonary disease. Electronically Signed   By: Lajean Manes M.D.   On: 10/28/2021 13:39   DG Abd Portable 1 View  Result Date: 10/28/2021 CLINICAL DATA:  Nasogastric tube placement. EXAM: PORTABLE ABDOMEN - 1 VIEW COMPARISON:  None. FINDINGS: Nasogastric tube extends well below diaphragm to curl within the to stomach in the left upper quadrant. IMPRESSION: Well-positioned nasogastric tube. Electronically Signed   By: Lajean Manes M.D.   On: 10/28/2021 13:39   EEG adult  Result Date: 10/28/2021 Lora Havens, MD     10/28/2021  9:19 PM Patient Name: [name] MRN: [id number] Epilepsy Attending: Lora Havens Referring Physician/Provider: Donita Brooks, NP Date: 10/28/2021 Duration: 21.01 mins Patient history: 71yo F s/p cardiac arrest. EEG to evaluate for seizure Level of alertness: comatos AEDs during EEG study: None Technical aspects: This EEG study was done with scalp electrodes positioned according to the 10-20 International system of electrode placement. Electrical activity was acquired at a sampling rate of 500Hz  and reviewed with a high frequency filter of 70Hz  and a  low frequency filter of 1Hz . EEG data were recorded continuously and digitally stored. Description: EEG showed near continuous generalized low amplitude 3 to 6 Hz theta-delta slowing admixed with 2-4 seconds of eeg attenuation. There was also 15-18hz  frontocentral beta activity intermittently. Hyperventilation and photic stimulation were not performed.   ABNORMALITY - Continuous slow, generalized - Background attenuation, generalized IMPRESSION: This study is suggestive of severe to profound diffuse encephalopathy, nonspecific etiology. No seizures or epileptiform discharges were seen throughout the recording. [name]   Overnight EEG with video  Result Date: 10/29/2021 Lora Havens, MD     10/30/2021  8:50 AM Patient Name: [name] MRN: [id number] Epilepsy Attending: Lora Havens Referring Physician/Provider: Donita Brooks, NP Duration: 10/28/2021 1920 to 10/29/2021 1920  Patient history: 71yo F s/p cardiac arrest. EEG to evaluate for seizure  Level of alertness: lethargic  AEDs during EEG study: None  Technical aspects: This EEG study was done with scalp electrodes positioned according to the 10-20 International system of electrode placement. Electrical activity was acquired at a sampling rate of 500Hz  and reviewed with a high frequency filter of 70Hz  and a low frequency filter of 1Hz . EEG data were recorded continuously and digitally stored.  Description: EEG showed near continuous generalized low amplitude 3 to 6 Hz theta-delta slowing admixed with 2-4 seconds of eeg attenuation. There was also 15-18hz  frontocentral beta activity intermittently. Hyperventilation and photic stimulation were not performed.   Patient event button was pressed on 10/29/2021 at 1057 and 1213 for shaking while being repositioned.  Concomitant EEG before, during and after the event did not show any EEG changes suggest seizure.  ABNORMALITY - Continuous slow, generalized - Background attenuation, generalized   IMPRESSION: This study is suggestive of severe to profound diffuse encephalopathy, nonspecific etiology. No seizures or epileptiform discharges were seen throughout the recording. Patient event button was pressed on 10/29/2021 at 1057 and 1213 for shaking while being repositioned without  concomitant EEG change.  These episodes were not epileptic.  Lora Havens   ECHOCARDIOGRAM COMPLETE  Result Date: 10/28/2021    ECHOCARDIOGRAM REPORT   Patient Name:   [name] Date of Exam: 10/28/2021 Medical Rec #:  [id number]       Height:       65.0 in Accession #:    [id number]      Weight:       170.0 lb Date of Birth:  [birth year]       BSA:          1.846 m Patient Age:    21 years        BP:           110/69 mmHg Patient Gender: F               HR:           104 bpm. Exam Location:  Inpatient Procedure: 2D Echo, Color Doppler and Limited Color Doppler STAT ECHO Indications:    Cardiac arrest  History:        Patient has prior history of Echocardiogram examinations.                 Hemoptysis. Respiratory failure requiring mechanical                 ventilation.  Sonographer:    Merrie Roof RDCS Referring Phys: [id number] [facility]  Sonographer Comments: Suboptimal apical window, echo performed with patient supine and on artificial respirator and Technically difficult study due to poor echo windows. IMPRESSIONS  1. Right ventricular function is abnormal. In the setting of cardiac arrest, would rule out pulmonary embolism if clinically indicated.  2. Left ventricular ejection fraction, by estimation, is 70 to 75%. The left ventricle has hyperdynamic function. The left ventricle has no regional wall motion abnormalities. There is moderate concentric left ventricular hypertrophy. Left ventricular diastolic parameters are indeterminate.  3. Hypokinesis of the mid-apical right ventricle with normal systolic function at the base. This pattern is opposite of McConnell's sign, which can be seen in acute pulmonary embolism.  Right ventricular systolic function is moderately reduced. The right  ventricular size is normal. There is severely elevated pulmonary artery systolic pressure.  4. Systolic anterior motion (SAM) of the mitral valve. The mitral valve is normal in structure. No evidence of mitral valve regurgitation. No evidence of mitral stenosis.  5. Tricuspid valve regurgitation is moderate to severe.  6. The aortic valve is tricuspid. Aortic valve regurgitation is not visualized. No aortic stenosis is present.  7. The inferior vena cava is normal in size with <50% respiratory variability, suggesting right atrial pressure of 8 mmHg. FINDINGS  Left Ventricle: Left ventricular ejection fraction, by estimation, is 70 to 75%. The left ventricle has hyperdynamic function. The left ventricle has no regional wall motion abnormalities. The left ventricular internal cavity size was normal in size. There is moderate concentric left ventricular hypertrophy. Left ventricular diastolic parameters are indeterminate. Right Ventricle: Hypokinesis of the mid-apical right ventricle with normal systolic function at the base. This pattern is opposite of McConnell's sign, which can be seen in acute pulmonary embolism. The right ventricular size is normal. No increase in right ventricular wall thickness. Right ventricular systolic function is moderately reduced. There is severely elevated pulmonary artery systolic pressure. The tricuspid regurgitant velocity is 3.84 m/s, and with an assumed right atrial pressure of 8 mmHg, the estimated right ventricular systolic pressure is 84.1 mmHg. Left  Atrium: Left atrial size was normal in size. Right Atrium: Right atrial size was normal in size. Pericardium: There is no evidence of pericardial effusion. Mitral Valve: Systolic anterior motion (SAM) of the mitral valve. The mitral valve is normal in structure. No evidence of mitral valve regurgitation. No evidence of mitral valve stenosis. Tricuspid Valve: The  tricuspid valve is normal in structure. Tricuspid valve regurgitation is moderate to severe. No evidence of tricuspid stenosis. Aortic Valve: The aortic valve is tricuspid. Aortic valve regurgitation is not visualized. No aortic stenosis is present. Pulmonic Valve: The pulmonic valve was normal in structure. Pulmonic valve regurgitation is trivial. No evidence of pulmonic stenosis. Aorta: The aortic root is normal in size and structure. Venous: IVC assessment for right atrial pressure unable to be performed due to mechanical ventilation. The inferior vena cava is normal in size with less than 50% respiratory variability, suggesting right atrial pressure of 8 mmHg. IAS/Shunts: No atrial level shunt detected by color flow Doppler.  LEFT VENTRICLE PLAX 2D LVIDd:         3.20 cm LVIDs:         2.10 cm LV PW:         1.43 cm LV IVS:        1.50 cm LVOT diam:     1.70 cm LVOT Area:     2.27 cm  RIGHT VENTRICLE          IVC RV Basal diam:  4.00 cm  IVC diam: 1.90 cm RV Mid diam:    4.00 cm LEFT ATRIUM         Index LA diam:    3.60 cm 1.95 cm/m   AORTA Ao Root diam: 3.30 cm Ao Asc diam:  2.90 cm TRICUSPID VALVE TR Peak grad:   59.0 mmHg TR Vmax:        384.00 cm/s  SHUNTS Systemic Diam: 1.70 cm Skeet Latch MD Electronically signed by Skeet Latch MD Signature Date/Time: 10/28/2021/3:55:58 PM    Final    US Abdomen Limited RUQ (LIVER/GB)  Result Date: 10/28/2021 CLINICAL DATA:  Abnormal CT, evaluate gallbladder. EXAM: ULTRASOUND ABDOMEN LIMITED RIGHT UPPER QUADRANT COMPARISON:  CT abdomen and pelvis 10/28/2021. FINDINGS: Gallbladder: No gallstones are identified. There is gallbladder wall thickening measuring up to 5.6 mm. Pericholecystic fluid is present. No sonographic Murphy sign noted by sonographer. Common bile duct: Diameter: 4.2 mm. Liver: There is a complex hypoechoic area measuring 1.7 x 1.4 x 1.3 cm in the left lobe of the liver. This measures smaller than lesion on CT and remains indeterminate. Within  normal limits in parenchymal echogenicity. Portal vein is patent on color Doppler imaging with normal direction of blood flow towards the liver. Other: Trace ascites. IMPRESSION: 1. Gallbladder wall thickening and pericholecystic fluid concerning for acute or chronic cholecystitis. No gallstones or biliary ductal dilatation. 2. Indeterminate left hepatic lesion. Recommend further evaluation with MRI. 3. Trace ascites. Electronically Signed   By: Ronney Asters M.D.   On: 10/28/2021 21:53    Labs:  CBC: Recent Labs    10/29/21 1949 10/30/21 0100 10/30/21 0400 10/30/21 0803 10/30/21 0938  WBC 20.5* 19.4* 21.4*  --  20.2*  HGB 10.6* 10.5* 9.8* 8.8* 9.5*  HCT 31.0* 29.3* 28.9* 26.0* 27.0*  PLT 122* 101* 101*   102*  --  92*    COAGS: Recent Labs    10/28/21 2036 10/29/21 0503 10/29/21 1646 10/30/21 0400  INR 3.1* 2.4* 2.2* 1.9*  APTT 65* 49* 198* 49*  BMP: Recent Labs    10/29/21 0840 10/29/21 1240 10/29/21 1949 10/30/21 0400 10/30/21 0803  NA 143 142 140 138 135  K 3.1* 2.9* 3.7 4.4 4.9  CL 101 102 103 99  --   CO2 22 24 24 25   --   GLUCOSE 465* 318* 91 111*  --   BUN 38* 40* 41* 41*  --   CALCIUM 7.9* 7.7* 7.2* 6.5*  --   CREATININE 3.72* 3.85* 4.15* 4.75*  --   GFRNONAA 12* 12* 11* 9*  --     LIVER FUNCTION TESTS: Recent Labs    10/29/21 0503 10/30/21 0400  BILITOT 1.2   1.3* 1.4*  AST 2,275*   2,252* 2,025*  ALT 1,210*   1,233* 1,540*  ALKPHOS 86   86 92  PROT 4.5*   4.8* 3.9*  ALBUMIN 2.1*   2.1* 1.7*    Assessment and Plan:  Saddle pulmonary embolism s/p catheter-directed thrombectomy 10/29/21 with Dr. Denna Haggard.  Patient remains intubated/sedated with hypothermia protocol in progress. Pressor support required. Right groin vascular access site is clean, soft and dry. Purse-string suture intact and has now been removed. Site covered with gauze/tape.   IR remains available as needed.   Electronically Signed: Soyla Dryer,  AGACNP-BC [phone] 10/30/2021, 1:23 PM   I spent a total of 15 Minutes at the the patient's bedside AND on the patient's hospital floor or unit, greater than 50% of which was counseling/coordinating care for pulmonary embolism s/p thrombectomy

## 2021-10-30 NOTE — Consult Note (Signed)
Consulting Physician: Nickola Major Emojean Gertz  Referring Provider: Dr. Su Grand Intensive Care Physician  Chief Complaint: Cardiac Arrest  Reason for Consult: Intra-abdominal hemorrhage   Subjective   HPI: [name] is an 71 y.o. female who is here after cardiac arrest.  She presented on 10/28/2021, in V. Fib arrest and had 20 minutes of CPR.  She coded two additional times in the emergency room.  She was in her normal health prior to this episode.  She was found to have a pulmonary embolism and underwent high risk thrombectomy with Dr. Denna Haggard on 10/29/20.  She continues to deal with DIC in the ICU.  She was undergoing targeted temperature management.  She became anemic and hypotensive today with an aPTT over 200.     I was called by Dr. Lucile Shutters at 9:25 PM and asked to see the patient.  He briefly told me the patient's room number and that there was massive hemoperitoneum on CT and massive transfusion protocol was being started.  The remainder of the history was obtained by reviewing the chart.   As I entered the room, I discussed the case with the patient's two daughters who were asking the results of the EEG and general updates.  They did say she is a heavy cigarette smoker.  She is up to date on colonoscopies and mammograms.  She was in her normal state of health until this episode began.    Past Medical History:  Diagnosis Date   Anxiety    Arthritis    Asthma    Depression    Diabetes mellitus without complication (HCC)    Fibromyalgia    GERD (gastroesophageal reflux disease)    Hyperlipemia    Hypertension    PONV (postoperative nausea and vomiting)     Past Surgical History:  Procedure Laterality Date   COLONOSCOPY     DILATION AND CURETTAGE OF UTERUS     IR THROMBECT PRIM MECH INIT (INCLU) MOD SED  10/29/2021   IR US GUIDE VASC ACCESS RIGHT  10/29/2021   KNEE ARTHROSCOPY  2003,2005   left   SHOULDER ARTHROSCOPY     left   TRIGGER FINGER RELEASE  08/13/2012    Procedure: RELEASE TRIGGER FINGER/A-1 PULLEY;  Surgeon: [name]., MD;  Location: [facility];  Service: Orthopedics;  Laterality: Right;  long finger   TUBAL LIGATION      Family History  Problem Relation Age of Onset   Diabetes Mother    Heart attack Father    Diabetes Sister    Throat cancer Brother    Hypertension Daughter     Social:  reports that she quit smoking about 11 years ago. Her smoking use included cigarettes. She has a 10.00 pack-year smoking history. She has never used smokeless tobacco. She reports that she does not drink alcohol and does not use drugs.  Allergies:  Allergies  Allergen Reactions   Aspirin Nausea And Vomiting    Other reaction(s): Unknown   Benadryl [Diphenhydramine]     Can only take dye free. States the dye causes itching.   Darvon [Propoxyphene] Itching    Can only during the day. Night time makes her itch   Hydrocodone Bit-Homatrop Mbr Itching   Metformin     Other reaction(s): GI   Other     Other reaction(s): Unknown. States reaction was to nasal spray that caused pupils to shrink   Pentazocine     nervous Other reaction(s): jitters  Percocet [Oxycodone-Acetaminophen]     itching   Sulfa Antibiotics Hives   Tetracaine Hcl     Other reaction(s): Unknown. Thinks caused itching.   Tetracyclines & Related Hives   Tramadol     Other reaction(s): itch    Medications: Current Outpatient Medications  Medication Instructions   acetaminophen (TYLENOL) 500-1,000 mg, Oral, Every 6 hours PRN   albuterol (ACCUNEB) 1.25 mg, Nebulization, Every 8 hours PRN   albuterol (PROVENTIL HFA;VENTOLIN HFA) 108 (90 BASE) MCG/ACT inhaler 1-2 puffs, Inhalation, Every 4 hours PRN   ALPRAZolam (XANAX) 0.25 mg, Oral, Daily PRN   baclofen (LIORESAL) 10 MG tablet TAKE 1 TABLET(10 MG) BY MOUTH TWICE DAILY AS NEEDED FOR MUSCLE SPASMS   betamethasone dipropionate (DIPROLENE) 0.05 % ointment 1 application to affected area   diclofenac  Sodium (VOLTAREN) 4 g, Topical, Daily PRN   diphenhydrAMINE HCl (BENADRYL ALLERGY PO) No dose, route, or frequency recorded.   Elastic Bandages & Supports (MEDICAL COMPRESSION STOCKINGS) MISC 1 each, Does not apply, Every 6 hours PRN   fluticasone (FLONASE) 50 MCG/ACT nasal spray 2 sprays, Each Nare, Daily   gabapentin (NEURONTIN) 300 mg, Oral, 2 times daily   hydrochlorothiazide (HYDRODIURIL) 12.5 mg, Oral, Daily   Lantus SoloStar 22 Units, Subcutaneous, Daily at bedtime   pantoprazole (PROTONIX) 40 mg, Oral, 2 times daily   repaglinide (PRANDIN) 2 mg, Oral, 2 times daily before meals   simvastatin (ZOCOR) 40 mg, Oral, Daily   temazepam (RESTORIL) 15 MG capsule TAKE 1 CAPSULE(15 MG) BY MOUTH AT BEDTIME AS NEEDED   Vitamin D (Ergocalciferol) (DRISDOL) 50,000 Units, Oral, Weekly    ROS - all of the below systems have been reviewed with the patient and positives are indicated with bold text General: chills, fever or night sweats Eyes: blurry vision or double vision ENT: epistaxis or sore throat Allergy/Immunology: itchy/watery eyes or nasal congestion Hematologic/Lymphatic: bleeding problems, blood clots or swollen lymph nodes Endocrine: temperature intolerance or unexpected weight changes Breast: new or changing breast lumps or nipple discharge Resp: cough, shortness of breath, or wheezing CV: chest pain or dyspnea on exertion GI: as per HPI GU: dysuria, trouble voiding, or hematuria MSK: joint pain or joint stiffness Neuro: TIA or stroke symptoms Derm: pruritus and skin lesion changes Psych: anxiety and depression  Objective   PE Blood pressure (!) 84/55, pulse (!) 111, temperature 98.2 F (36.8 C), resp. rate 16, height 5\' 5"  (1.651 m), weight 81.1 kg, SpO2 (!) 84 %. Constitutional: intubated, connected to ventilator Eyes: closed, PERRL Neck: ET  tube in place, some dry blood around mouth Lungs: Ventilated, bilateral breath sounds CV: Tachycardic, sinus GI: Abd Soft, mild  distention, unable to assess tenderness MSK: Pressure being held over right arm venipuncture site to stop bleeding Psychiatric: Intubated, unresponsive Lymphatic: No palpable cervical or axillary lymphadenopathy  Results for orders placed or performed during the hospital encounter of 10/28/21 (from the past 24 hour(s))  Glucose, capillary     Status: None   Collection Time: 10/29/21 10:56 PM  Result Value Ref Range   Glucose-Capillary 89 70 - 99 mg/dL  Glucose, capillary     Status: None   Collection Time: 10/29/21 11:57 PM  Result Value Ref Range   Glucose-Capillary 96 70 - 99 mg/dL  Brain natriuretic peptide     Status: Abnormal   Collection Time: 10/30/21  1:00 AM  Result Value Ref Range   B Natriuretic Peptide 877.3 (H) 0.0 - 100.0 pg/mL  CBC with Differential/Platelet  Status: Abnormal   Collection Time: 10/30/21  1:00 AM  Result Value Ref Range   WBC 19.4 (H) 4.0 - 10.5 K/uL   RBC 3.76 (L) 3.87 - 5.11 MIL/uL   Hemoglobin 10.5 (L) 12.0 - 15.0 g/dL   HCT 29.3 (L) 36.0 - 46.0 %   MCV 77.9 (L) 80.0 - 100.0 fL   MCH 27.9 26.0 - 34.0 pg   MCHC 35.8 30.0 - 36.0 g/dL   RDW 14.2 11.5 - 15.5 %   Platelets 101 (L) 150 - 400 K/uL   nRBC 0.1 0.0 - 0.2 %   Neutrophils Relative % 85 %   Neutro Abs 16.5 (H) 1.7 - 7.7 K/uL   Lymphocytes Relative 11 %   Lymphs Abs 2.1 0.7 - 4.0 K/uL   Monocytes Relative 2 %   Monocytes Absolute 0.5 0.1 - 1.0 K/uL   Eosinophils Relative 0 %   Eosinophils Absolute 0.0 0.0 - 0.5 K/uL   Basophils Relative 0 %   Basophils Absolute 0.1 0.0 - 0.1 K/uL   Immature Granulocytes 2 %   Abs Immature Granulocytes 0.29 (H) 0.00 - 0.07 K/uL  Glucose, capillary     Status: None   Collection Time: 10/30/21  1:00 AM  Result Value Ref Range   Glucose-Capillary 95 70 - 99 mg/dL  Glucose, capillary     Status: Abnormal   Collection Time: 10/30/21  2:05 AM  Result Value Ref Range   Glucose-Capillary 102 (H) 70 - 99 mg/dL  Glucose, capillary     Status: Abnormal    Collection Time: 10/30/21  2:58 AM  Result Value Ref Range   Glucose-Capillary 117 (H) 70 - 99 mg/dL  Glucose, capillary     Status: Abnormal   Collection Time: 10/30/21  3:59 AM  Result Value Ref Range   Glucose-Capillary 114 (H) 70 - 99 mg/dL  Magnesium     Status: Abnormal   Collection Time: 10/30/21  4:00 AM  Result Value Ref Range   Magnesium 1.6 (L) 1.7 - 2.4 mg/dL  Phosphorus     Status: Abnormal   Collection Time: 10/30/21  4:00 AM  Result Value Ref Range   Phosphorus 2.3 (L) 2.5 - 4.6 mg/dL  Hepatic function panel     Status: Abnormal   Collection Time: 10/30/21  4:00 AM  Result Value Ref Range   Total Protein 3.9 (L) 6.5 - 8.1 g/dL   Albumin 1.7 (L) 3.5 - 5.0 g/dL   AST 2,025 (H) 15 - 41 U/L   ALT 1,540 (H) 0 - 44 U/L   Alkaline Phosphatase 92 38 - 126 U/L   Total Bilirubin 1.4 (H) 0.3 - 1.2 mg/dL   Bilirubin, Direct 0.6 (H) 0.0 - 0.2 mg/dL   Indirect Bilirubin 0.8 0.3 - 0.9 mg/dL  Procalcitonin     Status: None   Collection Time: 10/30/21  4:00 AM  Result Value Ref Range   Procalcitonin >150.00 ng/mL  Basic metabolic panel     Status: Abnormal   Collection Time: 10/30/21  4:00 AM  Result Value Ref Range   Sodium 138 135 - 145 mmol/L   Potassium 4.4 3.5 - 5.1 mmol/L   Chloride 99 98 - 111 mmol/L   CO2 25 22 - 32 mmol/L   Glucose, Bld 111 (H) 70 - 99 mg/dL   BUN 41 (H) 8 - 23 mg/dL   Creatinine, Ser 4.75 (H) 0.44 - 1.00 mg/dL   Calcium 6.5 (L) 8.9 - 10.3 mg/dL   GFR, Estimated  9 (L) >60 mL/min   Anion gap 14 5 - 15  CBC with Differential/Platelet     Status: Abnormal   Collection Time: 10/30/21  4:00 AM  Result Value Ref Range   WBC 21.4 (H) 4.0 - 10.5 K/uL   RBC 3.68 (L) 3.87 - 5.11 MIL/uL   Hemoglobin 9.8 (L) 12.0 - 15.0 g/dL   HCT 28.9 (L) 36.0 - 46.0 %   MCV 78.5 (L) 80.0 - 100.0 fL   MCH 26.6 26.0 - 34.0 pg   MCHC 33.9 30.0 - 36.0 g/dL   RDW 14.3 11.5 - 15.5 %   Platelets 102 (L) 150 - 400 K/uL   nRBC 0.1 0.0 - 0.2 %   Neutrophils Relative % 86 %    Neutro Abs 18.2 (H) 1.7 - 7.7 K/uL   Lymphocytes Relative 10 %   Lymphs Abs 2.1 0.7 - 4.0 K/uL   Monocytes Relative 2 %   Monocytes Absolute 0.5 0.1 - 1.0 K/uL   Eosinophils Relative 0 %   Eosinophils Absolute 0.1 0.0 - 0.5 K/uL   Basophils Relative 0 %   Basophils Absolute 0.1 0.0 - 0.1 K/uL   Immature Granulocytes 2 %   Abs Immature Granulocytes 0.45 (H) 0.00 - 0.07 K/uL  DIC Panel 5A & 5P     Status: Abnormal   Collection Time: 10/30/21  4:00 AM  Result Value Ref Range   Prothrombin Time 21.5 (H) 11.4 - 15.2 seconds   INR 1.9 (H) 0.8 - 1.2   aPTT 49 (H) 24 - 36 seconds   Fibrinogen 243 210 - 475 mg/dL   D-Dimer, Quant >20.00 (H) 0.00 - 0.50 ug/mL-FEU   Platelets 101 (L) 150 - 400 K/uL   Smear Review NO SCHISTOCYTES SEEN   Glucose, capillary     Status: None   Collection Time: 10/30/21  4:58 AM  Result Value Ref Range   Glucose-Capillary 99 70 - 99 mg/dL  Glucose, capillary     Status: Abnormal   Collection Time: 10/30/21  6:04 AM  Result Value Ref Range   Glucose-Capillary 124 (H) 70 - 99 mg/dL  Glucose, capillary     Status: Abnormal   Collection Time: 10/30/21  6:53 AM  Result Value Ref Range   Glucose-Capillary 127 (H) 70 - 99 mg/dL  Brain natriuretic peptide     Status: Abnormal   Collection Time: 10/30/21  7:00 AM  Result Value Ref Range   B Natriuretic Peptide 617.6 (H) 0.0 - 100.0 pg/mL  Heparin level (unfractionated)     Status: Abnormal   Collection Time: 10/30/21  7:00 AM  Result Value Ref Range   Heparin Unfractionated <0.10 (L) 0.30 - 0.70 IU/mL  Culture, Respiratory w Gram Stain     Status: None (Preliminary result)   Collection Time: 10/30/21  7:46 AM   Specimen: Tracheal Aspirate; Respiratory  Result Value Ref Range   Specimen Description TRACHEAL ASPIRATE    Special Requests Normal    Gram Stain      MODERATE WBC PRESENT, PREDOMINANTLY PMN MODERATE YEAST RARE GRAM POSITIVE COCCI Performed at [facility], [place] [address] [place],  [place] [address]    Culture PENDING    Report Status PENDING   Glucose, capillary     Status: Abnormal   Collection Time: 10/30/21  7:59 AM  Result Value Ref Range   Glucose-Capillary 141 (H) 70 - 99 mg/dL  I-STAT 7, (LYTES, BLD GAS, ICA, H+H)     Status: Abnormal  Collection Time: 10/30/21  8:03 AM  Result Value Ref Range   pH, Arterial 7.599 (H) 7.350 - 7.450   pCO2 arterial 28.1 (L) 32.0 - 48.0 mmHg   pO2, Arterial 103 83.0 - 108.0 mmHg   Bicarbonate 27.6 20.0 - 28.0 mmol/L   TCO2 28 22 - 32 mmol/L   O2 Saturation 99.0 %   Acid-Base Excess 6.0 (H) 0.0 - 2.0 mmol/L   Sodium 135 135 - 145 mmol/L   Potassium 4.9 3.5 - 5.1 mmol/L   Calcium, Ion 0.84 (LL) 1.15 - 1.40 mmol/L   HCT 26.0 (L) 36.0 - 46.0 %   Hemoglobin 8.8 (L) 12.0 - 15.0 g/dL   Patient temperature 36.5 C    Sample type ARTERIAL    Comment NOTIFIED PHYSICIAN   CBC with Differential/Platelet     Status: Abnormal   Collection Time: 10/30/21  9:38 AM  Result Value Ref Range   WBC 20.2 (H) 4.0 - 10.5 K/uL   RBC 3.44 (L) 3.87 - 5.11 MIL/uL   Hemoglobin 9.5 (L) 12.0 - 15.0 g/dL   HCT 27.0 (L) 36.0 - 46.0 %   MCV 78.5 (L) 80.0 - 100.0 fL   MCH 27.6 26.0 - 34.0 pg   MCHC 35.2 30.0 - 36.0 g/dL   RDW 14.7 11.5 - 15.5 %   Platelets 92 (L) 150 - 400 K/uL   nRBC 0.0 0.0 - 0.2 %   Neutrophils Relative % 86 %   Neutro Abs 17.3 (H) 1.7 - 7.7 K/uL   Lymphocytes Relative 6 %   Lymphs Abs 1.1 0.7 - 4.0 K/uL   Monocytes Relative 2 %   Monocytes Absolute 0.5 0.1 - 1.0 K/uL   Eosinophils Relative 0 %   Eosinophils Absolute 0.0 0.0 - 0.5 K/uL   Basophils Relative 0 %   Basophils Absolute 0.1 0.0 - 0.1 K/uL   WBC Morphology MORPHOLOGY UNREMARKABLE    RBC Morphology MORPHOLOGY UNREMARKABLE    Smear Review PLATELETS APPEAR DECREASED    Immature Granulocytes 6 %   Abs Immature Granulocytes 1.21 (H) 0.00 - 0.07 K/uL  Lactic acid, plasma     Status: Abnormal   Collection Time: 10/30/21  9:38 AM  Result Value Ref Range   Lactic Acid,  Venous 4.0 (HH) 0.5 - 1.9 mmol/L  Glucose, capillary     Status: Abnormal   Collection Time: 10/30/21  9:38 AM  Result Value Ref Range   Glucose-Capillary 135 (H) 70 - 99 mg/dL  Glucose, capillary     Status: Abnormal   Collection Time: 10/30/21 10:50 AM  Result Value Ref Range   Glucose-Capillary 131 (H) 70 - 99 mg/dL  Vancomycin, random     Status: None   Collection Time: 10/30/21 10:57 AM  Result Value Ref Range   Vancomycin Rm 21   Glucose, capillary     Status: Abnormal   Collection Time: 10/30/21 12:04 PM  Result Value Ref Range   Glucose-Capillary 133 (H) 70 - 99 mg/dL  Glucose, capillary     Status: Abnormal   Collection Time: 10/30/21  1:07 PM  Result Value Ref Range   Glucose-Capillary 134 (H) 70 - 99 mg/dL  Glucose, capillary     Status: Abnormal   Collection Time: 10/30/21  1:50 PM  Result Value Ref Range   Glucose-Capillary 136 (H) 70 - 99 mg/dL  I-STAT 7, (LYTES, BLD GAS, ICA, H+H)     Status: Abnormal   Collection Time: 10/30/21  1:56 PM  Result Value Ref  Range   pH, Arterial 7.657 (HH) 7.350 - 7.450   pCO2 arterial 22.7 (L) 32.0 - 48.0 mmHg   pO2, Arterial 124 (H) 83.0 - 108.0 mmHg   Bicarbonate 25.4 20.0 - 28.0 mmol/L   TCO2 26 22 - 32 mmol/L   O2 Saturation 99.0 %   Acid-Base Excess 5.0 (H) 0.0 - 2.0 mmol/L   Sodium 133 (L) 135 - 145 mmol/L   Potassium 5.6 (H) 3.5 - 5.1 mmol/L   Calcium, Ion 0.85 (LL) 1.15 - 1.40 mmol/L   HCT 22.0 (L) 36.0 - 46.0 %   Hemoglobin 7.5 (L) 12.0 - 15.0 g/dL   Patient temperature 37.2 C    Sample type ARTERIAL    Comment NOTIFIED PHYSICIAN   CBC with Differential/Platelet     Status: Abnormal   Collection Time: 10/30/21  1:57 PM  Result Value Ref Range   WBC 21.2 (H) 4.0 - 10.5 K/uL   RBC 2.88 (L) 3.87 - 5.11 MIL/uL   Hemoglobin 8.3 (L) 12.0 - 15.0 g/dL   HCT 22.4 (L) 36.0 - 46.0 %   MCV 77.8 (L) 80.0 - 100.0 fL   MCH 28.8 26.0 - 34.0 pg   MCHC 37.1 (H) 30.0 - 36.0 g/dL   RDW 14.6 11.5 - 15.5 %   Platelets 100 (L) 150  - 400 K/uL   nRBC 0.0 0.0 - 0.2 %   Neutrophils Relative % 84 %   Neutro Abs 17.9 (H) 1.7 - 7.7 K/uL   Lymphocytes Relative 6 %   Lymphs Abs 1.2 0.7 - 4.0 K/uL   Monocytes Relative 2 %   Monocytes Absolute 0.5 0.1 - 1.0 K/uL   Eosinophils Relative 2 %   Eosinophils Absolute 0.4 0.0 - 0.5 K/uL   Basophils Relative 0 %   Basophils Absolute 0.0 0.0 - 0.1 K/uL   WBC Morphology MORPHOLOGY UNREMARKABLE    RBC Morphology BURR CELLS    Smear Review PLATELETS APPEAR DECREASED    Immature Granulocytes 6 %   Abs Immature Granulocytes 1.20 (H) 0.00 - 0.07 K/uL  Magnesium     Status: None   Collection Time: 10/30/21  1:57 PM  Result Value Ref Range   Magnesium 1.9 1.7 - 2.4 mg/dL  Phosphorus     Status: None   Collection Time: 10/30/21  1:57 PM  Result Value Ref Range   Phosphorus 2.5 2.5 - 4.6 mg/dL  Potassium     Status: Abnormal   Collection Time: 10/30/21  1:57 PM  Result Value Ref Range   Potassium 5.8 (H) 3.5 - 5.1 mmol/L  BLOOD TRANSFUSION REPORT - SCANNED     Status: None   Collection Time: 10/30/21  2:57 PM   Narrative   Ordered by an unspecified provider.  Glucose, capillary     Status: Abnormal   Collection Time: 10/30/21  3:08 PM  Result Value Ref Range   Glucose-Capillary 144 (H) 70 - 99 mg/dL  Lactic acid, plasma     Status: Abnormal   Collection Time: 10/30/21  3:12 PM  Result Value Ref Range   Lactic Acid, Venous 3.4 (HH) 0.5 - 1.9 mmol/L  Glucose, capillary     Status: Abnormal   Collection Time: 10/30/21  4:18 PM  Result Value Ref Range   Glucose-Capillary 162 (H) 70 - 99 mg/dL  Glucose, capillary     Status: Abnormal   Collection Time: 10/30/21  5:18 PM  Result Value Ref Range   Glucose-Capillary 167 (H) 70 - 99 mg/dL  DIC Panel 5A & 5P     Status: Abnormal   Collection Time: 10/30/21  5:21 PM  Result Value Ref Range   Prothrombin Time 22.2 (H) 11.4 - 15.2 seconds   INR 2.0 (H) 0.8 - 1.2   aPTT >200 (HH) 24 - 36 seconds   Fibrinogen 339 210 - 475 mg/dL    D-Dimer, Quant >20.00 (H) 0.00 - 0.50 ug/mL-FEU   Platelets 101 (L) 150 - 400 K/uL   Smear Review NO SCHISTOCYTES SEEN   Heparin level (unfractionated)     Status: Abnormal   Collection Time: 10/30/21  5:21 PM  Result Value Ref Range   Heparin Unfractionated 0.74 (H) 0.30 - 0.70 IU/mL  I-STAT 7, (LYTES, BLD GAS, ICA, H+H)     Status: Abnormal   Collection Time: 10/30/21  5:23 PM  Result Value Ref Range   pH, Arterial 7.391 7.350 - 7.450   pCO2 arterial 42.7 32.0 - 48.0 mmHg   pO2, Arterial 90 83.0 - 108.0 mmHg   Bicarbonate 25.8 20.0 - 28.0 mmol/L   TCO2 27 22 - 32 mmol/L   O2 Saturation 97.0 %   Acid-Base Excess 1.0 0.0 - 2.0 mmol/L   Sodium 132 (L) 135 - 145 mmol/L   Potassium 5.6 (H) 3.5 - 5.1 mmol/L   Calcium, Ion 0.88 (LL) 1.15 - 1.40 mmol/L   HCT 17.0 (L) 36.0 - 46.0 %   Hemoglobin 5.8 (LL) 12.0 - 15.0 g/dL   Patient temperature 37.3 C    Sample type ARTERIAL    Comment NOTIFIED PHYSICIAN   CBC     Status: Abnormal   Collection Time: 10/30/21  5:57 PM  Result Value Ref Range   WBC 24.6 (H) 4.0 - 10.5 K/uL   RBC 2.34 (L) 3.87 - 5.11 MIL/uL   Hemoglobin 6.9 (LL) 12.0 - 15.0 g/dL   HCT 18.9 (L) 36.0 - 46.0 %   MCV 80.8 80.0 - 100.0 fL   MCH 29.5 26.0 - 34.0 pg   MCHC 36.5 (H) 30.0 - 36.0 g/dL   RDW 14.9 11.5 - 15.5 %   Platelets 101 (L) 150 - 400 K/uL   nRBC 0.2 0.0 - 0.2 %  Prepare RBC (crossmatch)     Status: None   Collection Time: 10/30/21  6:04 PM  Result Value Ref Range   Order Confirmation      ORDER PROCESSED BY BLOOD BANK Performed at [facility], [address] [place], [place] [address]   Prepare RBC (crossmatch)     Status: None   Collection Time: 10/30/21  6:23 PM  Result Value Ref Range   Order Confirmation      ORDER PROCESSED BY BLOOD BANK Performed at [facility], [place] [address], [place], [place] [address]   Prepare RBC (crossmatch)     Status: None   Collection Time: 10/30/21  7:01 PM  Result Value Ref Range   Order  Confirmation      ORDER PROCESSED BY BLOOD BANK BB SAMPLE OR UNITS ALREADY AVAILABLE Performed at [facility], [place] [address] [place], [place] [address]   Glucose, capillary     Status: Abnormal   Collection Time: 10/30/21  7:28 PM  Result Value Ref Range   Glucose-Capillary 105 (H) 70 - 99 mg/dL  Glucose, capillary     Status: Abnormal   Collection Time: 10/30/21  9:16 PM  Result Value Ref Range   Glucose-Capillary 147 (H) 70 - 99 mg/dL  Comment 1 Document in Chart   Lactic acid, plasma     Status: Abnormal   Collection Time: 10/30/21  9:24 PM  Result Value Ref Range   Lactic Acid, Venous 7.4 (HH) 0.5 - 1.9 mmol/L  Initiate MTP (Blood Bank Notification)     Status: None   Collection Time: 10/30/21  9:28 PM  Result Value Ref Range   Initiate Massive Transfusion Protocol      MTP ACTIVATED Performed at [facility], [address], [place], [place] [address]   DIC Panel now then every 30 minutes     Status: Abnormal   Collection Time: 10/30/21  9:41 PM  Result Value Ref Range   Prothrombin Time 21.0 (H) 11.4 - 15.2 seconds   INR 1.8 (H) 0.8 - 1.2   aPTT 157 (H) 24 - 36 seconds   Fibrinogen 342 210 - 475 mg/dL   D-Dimer, Quant >20.00 (H) 0.00 - 0.50 ug/mL-FEU   Platelets 84 (L) 150 - 400 K/uL   Smear Review NO SCHISTOCYTES SEEN   Hemoglobin and hematocrit, blood (STAT)     Status: Abnormal   Collection Time: 10/30/21  9:41 PM  Result Value Ref Range   Hemoglobin 11.2 (L) 12.0 - 15.0 g/dL   HCT 32.5 (L) 36.0 - 46.0 %     Imaging Orders         DG Chest Portable 1 View         CT HEAD WO CONTRAST (5MM)         DG Abd Portable 1 View         DG CHEST PORT 1 VIEW         CT Angio Chest Pulmonary Embolism (PE) W or WO Contrast         CT ABDOMEN PELVIS W CONTRAST         US Abdomen Limited RUQ (LIVER/GB)         DG Chest Port 1 View         IR THROMBECT PRIM MECH INIT (INCLU) MOD SED         IR US Guide Vasc Access Right         CT ABDOMEN PELVIS WO  CONTRAST     Personally reviewed RUQ [name], CT abd/pel from admission and CT abd/pel from today.  Appears she has a lesion in the left lobe of her liver that has bleed.  Now she has hemoperitoneum.     Assessment and Plan   [name] is an 71 y.o. female smoker who presented after cardiac arrest, s/p thrombectomy for pulmonary embolism, in the ICU dealing with DIC, found to have hemoperitoneum likely secondary to hemorrhage from a lesion in the left lobe of the liver.  I suspect this liver lesion is a cancer (either primary or malignant) which would provide an additional risk factor/explanation for her pulmonary embolism.  At this time it does not appear she would tolerate an operation due to her coagulopathy and instability/pressor requirements.  I recommend continued transfusion and critical care support in efforts to get her out of DIC.  This alone may stop the bleeding from the liver.  I am concerned for her neurologic status and worried about performing a futile operation.  The case was discussed and CTs reviewed with Dr. Kathlene Cote with interventional radiology over the phone.  He agreed with my assessment.  Nonselective embolization of the left lobe of the liver could be an option, however this could be  complicated by ischemia and would continue to take a toll on her kidney function.  Hopefully she will respond to transfusion tonight, if not we can re-evaluate our options.   Felicie Morn, MD  [facility] Use [email or web address] to contact on call provider   [phone] - [place]

## 2021-10-30 NOTE — Progress Notes (Addendum)
Progress Note  Called to bedside for worsening hypotension over the last hour. Noted to have drop in H&H on istat. Already in three vasopressor shock over the last hour. Clinical picture consistent with DIC and patient on heparin drip. Had femoral access for mechanical thrombectomy for PE yesterday. On TTM for OOH VF arrest.   I performed a bedside echocardiogram which shows a hyperdynamic LV which appears under-filled.  Concern for hemorrhagic shock superimposed on cardiogenic shock. Ordering stat 2 units PRBC. Bolusing crystalloid until this arrives. Awaiting H&H.  Heparin paused. She may need additional blood products pending her response.   CT A/P ordered stat for once she stabilizes to evaluate for RP bleed.   Addendum:  Repeat Hgb low. Patient responding to blood products. Continue transfusions prbc x3 total and re-evaluate.   Contrast risk discussed with family at bedside - they would prefer not to use IV contrast given her renal function.   Additional CC time 40 minutes.

## 2021-10-30 NOTE — Progress Notes (Addendum)
Initial Nutrition Assessment  DOCUMENTATION CODES:   Not applicable  INTERVENTION:   Tube Feeding: Initiate trickle TF today, Vital AF 1.2 at 20 ml/hr Goal rate: Vital AF 1.2 at 60 ml/hr Goal rate provides 108 g of protein, 1728 kcals, 1166 mL of free water  TF regimen and propofol at current rate providing 1995 total kcal/day    NUTRITION DIAGNOSIS:   Inadequate oral intake related to acute illness as evidenced by NPO status.  GOAL:   Patient will meet greater than or equal to 90% of their needs  MONITOR:   Vent status, Labs, Weight trends, TF tolerance  REASON FOR ASSESSMENT:   Ventilator   ASSESSMENT:   71 yo female admitted with acute encephalopathy post OOH prolonged V.fib arrest with multiorgan failure with massive saddle PE and DIC, AKI. PMH includes DM, HTN, HLD, GERD, anxiety, depression, fibromyalgia  1/01 Admitted, VF arrest, Intubated, Concern for anoxic injury 1/02 Saddle PE s/p Mechanical thrombectomy  Hemodynamics improving post thrombectomy Pt remains on vent support; remains on TTM-normothermia, started on vasopressin overnight, weaning epinephrine, remains on levophed Propofol: 9.3 ml/hr  Bloody output from OG and mouth is reduced from yesterday; +multiple BMs-some blood in stool  Hgb relatively stable  Discussed nutrition poc with Dr. Loanne Drilling and received verbal order to initiate trickle TF today  Unable to obtain diet and weight history from patient at this time   CBGs well controlled at present on insulin drip; ICU goal 140-180  Labs: sodium 133 (L), potassium 5.6 (L) Meds: colace, insulin drip, mag sulfate, miralax  NUTRITION - FOCUSED PHYSICAL EXAM:  Limited exam as pt with [employer] in place  [employer] Most Recent Value  Orbital Region No depletion  Upper Arm Region No depletion  Thoracic and Lumbar Region Unable to assess  Buccal Region Unable to assess  Temple Region No depletion  Clavicle Bone Region No depletion   Clavicle and Acromion Bone Region No depletion  Scapular Bone Region Unable to assess  Dorsal Hand No depletion  Patellar Region Unable to assess  Anterior Thigh Region Unable to assess  Posterior Calf Region Unable to assess  Edema (RD Assessment) Moderate        Diet Order:   Diet Order             Diet NPO time specified  Diet effective now                   EDUCATION NEEDS:   Not appropriate for education at this time  Skin:  Skin Assessment: Reviewed RN Assessment  Last BM:  1/3  Height:   Ht Readings from Last 1 Encounters:  10/28/21 5\' 5"  (1.651 m)    Weight:   Wt Readings from Last 1 Encounters:  10/29/21 81.1 kg     BMI:  Body mass index is 29.75 kg/m.  Estimated Nutritional Needs:   Kcal:  6256-3893 kcals  Protein:  85-115 g  Fluid:  >/= 1.8 L   Kerman Passey MS, RDN, LDN, CNSC Registered Dietitian III Clinical Nutrition RD Pager and On-Call Pager Number Located in [place]

## 2021-10-30 NOTE — Progress Notes (Signed)
NAME:  [name], MRN:  [id number], DOB:  [birth year], LOS: 2 ADMISSION DATE:  10/28/2021, CONSULTATION DATE: 10/28/21 REFERRING MD: Dr. Sherry Ruffing, CHIEF COMPLAINT:  Cardiac Arrest, VF   History of Present Illness:  [name] presenting with prolonged OOH VF arrest secondary to massive pulmonary embolus complicated by DIC.  Pertinent  Medical History  Anxiety / Depression  Fibromyalgia  Asthma  DM  GERD  HTN  HLD   Significant Hospital Events: Including procedures, antibiotic start and stop dates in addition to other pertinent events   1/1 Admit post VF arrest  1/2 S/p mechanical thrombectomy of right and left PA 1/3 Weaned to low dose epi and levo  Interim History / Subjective:  Weaned to low dose epi and levo Weaned to minimal vent settings. PEEP currently 5 Continues to have OG bleeding and lines  Objective   Blood pressure (!) 126/48, pulse 90, temperature 98.6 F (37 C), resp. rate (!) 30, height 5\' 5"  (1.651 m), weight 81.1 kg, SpO2 99 %.    Vent Mode: PRVC FiO2 (%):  [50 %-100 %] 50 % Set Rate:  [30 bmp] 30 bmp Vt Set:  [440 mL] 440 mL PEEP:  [5 cmH20-10 cmH20] 5 cmH20 Plateau Pressure:  [17 cmH20-24 cmH20] 20 cmH20   Intake/Output Summary (Last 24 hours) at 10/30/2021 0710 Last data filed at 10/30/2021 0700 Gross per 24 hour  Intake 6976.54 ml  Output 1115 ml  Net 5861.54 ml   Filed Weights   10/28/21 1405 10/28/21 1900 10/29/21 0700  Weight: 77.1 kg 77.1 kg 81.1 kg   Physical Exam: General: Critically ill-appearing, sedated HENT: [place], AT, ETT in place Eyes: pinpoint and equal, sluggish reactive, no scleral icterus Respiratory: Clear to auscultation bilaterally.  No crackles, wheezing or rales Cardiovascular: RRR, -M/R/G, no JVD GI: BS+, soft, nontender Extremities:-Edema,-tenderness Neuro: Sedated, +gag reflex,   INR and fibrinogen improving Cr worsening to~4 Mg low  Resolved Hospital Problem list     Assessment & Plan:  Acute metabolic  encephalopathy in setting of arrest Concerned for anoxic brain injury - Will evaluate with MRI when stable - EEG. No seizures  OOH prolonged Vfib Arrest with multiorgan failure Secondary to unprovoked massive pulmonary embolism with DIC ongoing severe bleeding limiting treatment options.  S/p thrombectomy with improving hemodynamics.  Given prolonged OOH arrest, poor neurological exam I do not think she is a good ECMO candidate. - Telemetry - Normothermia - Heparin gtt  Obstructive shock secondary to PE - Wean epi and levo for MAP goal >65 - Vasopressin added overnight to facilitate pressor weaning - Stress dose steroids - LR - Trend LA - Sodium bicarb gtt - Obtain ABG. If pH > 7.2, discontinue sodium bicarb  Acute hypoxemic respiratory failure secondary to cardiac arrest, aspiration pneumonia - Continue neb: Brovana and Pulmicort - Vanc and Cefepime 1/1> - Full vent support - Daily SBT/WUA when qualified - PAD protocol: Fentanyl and Propofol  Suspected DIC - improving fibrinogen and INR S/p vitamin K. No schistocytes on smear - Trend DIC panel  Acute renal failure, related to low perfusion during arrest - worsening - Trend UOP/Cr - Avoid nephrotoxic agents  DM II with severe hyperglycemia r/o DKA Anion gap acidosis - improving - CBG - IV insulin - SSI  Hypomag - Repleted  HTN, HLD, GERD, Anxiety, Depression, Fibromyalgia  Liver lesions NOS- MRI if recovers  Best Practice (right click and "Reselect all SmartList Selections" daily)  Diet/type: NPO DVT prophylaxis: SCD GI prophylaxis: PPI  Lines: Central line Foley:  N/A Code Status:  full code Last date of multidisciplinary goals of care discussion: 1/1, continue aggressive care for now   The patient is critically ill for cardiac arrest, massive pulmonary embolism, acute hypoxemic respiratory failure, obstructive/septic shock and requires high complexity decision making for assessment and support, frequent  evaluation and titration of therapies, application of advanced monitoring technologies and extensive interpretation of multiple databases.  Independent Critical Care Time: 77 Minutes.   Rodman Pickle, M.D. [facility] Pulmonary/Critical Care Medicine 10/30/2021 7:10 AM   Please see Amion for pager number to reach on-call Pulmonary and Critical Care Team.

## 2021-10-30 NOTE — Progress Notes (Signed)
eLink Physician-Brief Progress Note Patient Name: [name] DOB: 1951/08/26 MRN: [id number]   Date of Service  10/30/2021  HPI/Events of Note  Tachycardic with current pressors.  eICU Interventions  Added vasopressin to facilitate down titrating norepinephrine     Intervention Category Intermediate Interventions: Hypotension - evaluation and management  [name] [name] 10/30/2021, 5:08 AM

## 2021-10-30 NOTE — Progress Notes (Addendum)
[place] for heparin  Indication: pulmonary embolus  Allergies  Allergen Reactions   Aspirin Nausea And Vomiting    Other reaction(s): Unknown   Benadryl [Diphenhydramine]     Can only take dye free. States the dye causes itching.   Darvon [Propoxyphene] Itching    Can only during the day. Night time makes her itch   Hydrocodone Bit-Homatrop Mbr Itching   Metformin     Other reaction(s): GI   Other     Other reaction(s): Unknown. States reaction was to nasal spray that caused pupils to shrink   Pentazocine     nervous Other reaction(s): jitters   Percocet [Oxycodone-Acetaminophen]     itching   Sulfa Antibiotics Hives   Tetracaine Hcl     Other reaction(s): Unknown. Thinks caused itching.   Tetracyclines & Related Hives   Tramadol     Other reaction(s): itch    Patient Measurements: Height: 5' 5"  (165.1 cm) Weight: 81.1 kg (178 lb 12.7 oz) IBW/kg (Calculated) : 57  Vital Signs: Temp: 97.7 F (36.5 C) (01/03 0800) Temp Source: Bladder (01/03 0800) BP: 126/48 (01/03 0313) Pulse Rate: 94 (01/03 0815)  Labs: Recent Labs    10/29/21 0106 10/29/21 0119 10/29/21 0503 10/29/21 0510 10/29/21 0840 10/29/21 1240 10/29/21 1646 10/29/21 1949 10/29/21 2105 10/30/21 0100 10/30/21 0400 10/30/21 0700  HGB 12.3   < > 11.7*   < > 11.5* 11.2* 10.4* 10.6*  --  10.5* 9.8*  --   HCT 37.8   < > 35.5*   < > 33.6* 32.9* 29.7* 31.0*  --  29.3* 28.9*  --   PLT 204  --  188   181  --  168 156 132*   130* 122*  --  101* 101*   102*  --   APTT  --   --  49*  --   --   --  198*  --   --   --  49*  --   LABPROT  --   --  25.9*  --   --   --  24.8*  --   --   --  21.5*  --   INR  --   --  2.4*  --   --   --  2.2*  --   --   --  1.9*  --   HEPARINUNFRC  --    < > <0.10*  --   --  0.16*  --   --  0.16*  --   --  <0.10*  CREATININE 3.18*  --  3.52*  --  3.72* 3.85*  --  4.15*  --   --  4.75*  --   TROPONINIHS 8,219*  --   --   --  11,507* 14,724*   --   --   --   --   --   --    < > = values in this interval not displayed.     Estimated Creatinine Clearance: 11.3 mL/min (A) (by C-G formula based on SCr of 4.75 mg/dL (H)).   Medical History: Past Medical History:  Diagnosis Date   Anxiety    Arthritis    Asthma    Depression    Diabetes mellitus without complication (HCC)    Fibromyalgia    GERD (gastroesophageal reflux disease)    Hyperlipemia    Hypertension    PONV (postoperative nausea and vomiting)     Medications:   Scheduled:  sodium chloride   Intravenous Once   sodium chloride   Intravenous Once   arformoterol  15 mcg Nebulization BID   budesonide (PULMICORT) nebulizer solution  0.5 mg Nebulization BID   chlorhexidine gluconate (MEDLINE KIT)  15 mL Mouth Rinse BID   Chlorhexidine Gluconate Cloth  6 each Topical Daily   docusate  100 mg Per Tube BID   fentaNYL (SUBLIMAZE) injection  25 mcg Intravenous Once   hydrocortisone sod succinate (SOLU-CORTEF) inj  100 mg Intravenous BID   mouth rinse  15 mL Mouth Rinse 10 times per day   pantoprazole (PROTONIX) IV  40 mg Intravenous Q12H   polyethylene glycol  17 g Per Tube Daily   sodium chloride flush  10-40 mL Intracatheter Q12H   vancomycin variable dose per unstable renal function (pharmacist dosing)   Does not apply See admin instructions    Assessment: 71 yo female with saddle PE to start heparin. She is noted with a hematoma around RIJ and blood has been noted from the NG tube. Case discussed with Dr. Carlis Abbott and pharmacy dosing heparin. She is s/p thrombectomy and 4000 units IV heparin given in procedure. DIC driven by clot burden.  -Hgb= 9.8, plt= 102 -INR= 1.9 -Fibrinogen= 93 > 243 -Heparin level <0.10  1/3 AM: New bloody bowel movement per RN. Hgb reordered to assess for potential GIB. Hold heparin x1 hr and restart at previous rate given appropriate heparin level drawn and large PE. Additional Hgb level was 9.5 and stable. Increase to 1200 per original  plan.  Goal of Therapy:  Heparin level 0.3-0.5 units/ml Monitor platelets by anticoagulation protocol: Yes   Plan:  - HOLD heparin x1 hr at 1000 untis for new bloody bowel movement and additional Hgb lab - Increase heparin to 1200 units/hr after holding at current dose for x1 hr - Heparin level in 8 hours and daily wth CBC q4hr - Continue to monitor for signs/symptoms of bleeding   Varney Daily, PharmD PGY1 Pharmacy Resident  Please check AMION for all [facility] pharmacy phone numbers After 10:00 PM call main pharmacy [phone]

## 2021-10-30 NOTE — Progress Notes (Signed)
[place] for heparin  Indication: pulmonary embolus  Allergies  Allergen Reactions   Aspirin Nausea And Vomiting    Other reaction(s): Unknown   Benadryl [Diphenhydramine]     Can only take dye free. States the dye causes itching.   Darvon [Propoxyphene] Itching    Can only during the day. Night time makes her itch   Hydrocodone Bit-Homatrop Mbr Itching   Metformin     Other reaction(s): GI   Other     Other reaction(s): Unknown. States reaction was to nasal spray that caused pupils to shrink   Pentazocine     nervous Other reaction(s): jitters   Percocet [Oxycodone-Acetaminophen]     itching   Sulfa Antibiotics Hives   Tetracaine Hcl     Other reaction(s): Unknown. Thinks caused itching.   Tetracyclines & Related Hives   Tramadol     Other reaction(s): itch    Patient Measurements: Height: _0  (165.1 cm) Weight: 81.1 kg (178 lb 12.7 oz) IBW/kg (Calculated) : 57  Vital Signs: Temp: 99.1 F (37.3 C) (01/03 1700) Temp Source: Bladder (01/03 1600) BP: 85/49 (01/03 1730) Pulse Rate: 110 (01/03 1730)  Labs: Recent Labs    10/29/21 0106 10/29/21 0119 10/29/21 0503 10/29/21 0510 10/29/21 0840 10/29/21 1240 10/29/21 1646 10/29/21 1949 10/29/21 2105 10/30/21 0100 10/30/21 0400 10/30/21 0700 10/30/21 0803 10/30/21 0938 10/30/21 1356 10/30/21 1357 10/30/21 1721 10/30/21 1723  HGB 12.3   < > 11.7*   < > 11.5* 11.2* 10.4* 10.6*  --    < > 9.8*  --    < > 9.5* 7.5* 8.3*  --  5.8*  HCT 37.8   < > 35.5*   < > 33.6* 32.9* 29.7* 31.0*  --    < > 28.9*  --    < > 27.0* 22.0* 22.4*  --  17.0*  PLT 204  --  188   181  --  168 156 132*   130* 122*  --    < > 101*   102*  --   --  92*  --  100*  --   --   APTT  --   --  49*  --   --   --  198*  --   --   --  49*  --   --   --   --   --   --   --   LABPROT  --   --  25.9*  --   --   --  24.8*  --   --   --  21.5*  --   --   --   --   --   --   --   INR  --   --  2.4*  --   --   --   2.2*  --   --   --  1.9*  --   --   --   --   --   --   --   HEPARINUNFRC  --    < > <0.10*  --   --  0.16*  --   --  0.16*  --   --  <0.10*  --   --   --   --  0.74*  --   CREATININE 3.18*  --  3.52*  --  3.72* 3.85*  --  4.15*  --   --  4.75*  --   --   --   --   --   --   --  TROPONINIHS 8,219*  --   --   --  11,507* 37,169*  --   --   --   --   --   --   --   --   --   --   --   --    < > = values in this interval not displayed.    Estimated Creatinine Clearance: 11.3 mL/min (A) (by C-G formula based on SCr of 4.75 mg/dL (H)).   Medical History: Past Medical History:  Diagnosis Date   Anxiety    Arthritis    Asthma    Depression    Diabetes mellitus without complication (HCC)    Fibromyalgia    GERD (gastroesophageal reflux disease)    Hyperlipemia    Hypertension    PONV (postoperative nausea and vomiting)     Medications:   Scheduled:   sodium chloride   Intravenous Once   sodium chloride   Intravenous Once   sodium chloride   Intravenous Once   arformoterol  15 mcg Nebulization BID   budesonide (PULMICORT) nebulizer solution  0.5 mg Nebulization BID   chlorhexidine gluconate (MEDLINE KIT)  15 mL Mouth Rinse BID   Chlorhexidine Gluconate Cloth  6 each Topical Daily   docusate  100 mg Per Tube BID   fentaNYL (SUBLIMAZE) injection  25 mcg Intravenous Once   hydrocortisone sod succinate (SOLU-CORTEF) inj  100 mg Intravenous BID   mouth rinse  15 mL Mouth Rinse 10 times per day   pantoprazole (PROTONIX) IV  40 mg Intravenous Q12H   polyethylene glycol  17 g Per Tube Daily   sodium chloride flush  10-40 mL Intracatheter Q12H   vancomycin variable dose per unstable renal function (pharmacist dosing)   Does not apply See admin instructions    Assessment: 71 yo female with saddle PE to start heparin. She is noted with a hematoma around RIJ and blood has been noted from the NG tube. Case discussed with Dr. Carlis Abbott and pharmacy dosing heparin. She is s/p thrombectomy and 4000  units IV heparin given in procedure. DIC driven by clot burden.  -Hgb= 9.8, plt= 102 -INR= 1.9 -Fibrinogen= 93 > 243 -Heparin level <0.10  1/3 PM: Heparin level on recheck is now high at 0.74 (site was changed) and Hgb is low on I-STAT at 5.8. CBC in-process. Patient with recent bloody bowel movement this AM and MAPs currently <65. Discussed with Dr. Shearon Stalls and plans to stop IV heparin for now (stopped 1800 PM), transfuse, and get an CT of Abdomen and Pelvis.   Goal of Therapy:  Heparin level 0.3-0.5 units/ml Monitor platelets by anticoagulation protocol: Yes   Plan:  - Discontinue IV Heparin - Follow-up CT Abd/Pelvis and plans per CCM for Heparin therapy  Sloan Leiter, PharmD, BCPS, BCCCP Clinical Pharmacist Please refer to [facility] for [place] numbers 10/30/2021, 6:14 PM

## 2021-10-31 DIAGNOSIS — I469 Cardiac arrest, cause unspecified: Secondary | ICD-10-CM | POA: Diagnosis not present

## 2021-10-31 LAB — BASIC METABOLIC PANEL
Anion gap: 18 — ABNORMAL HIGH (ref 5–15)
Anion gap: 16 — ABNORMAL HIGH (ref 5–15)
Anion gap: 16 — ABNORMAL HIGH (ref 5–15)
BUN: 50 mg/dL — ABNORMAL HIGH (ref 8–23)
BUN: 51 mg/dL — ABNORMAL HIGH (ref 8–23)
BUN: 56 mg/dL — ABNORMAL HIGH (ref 8–23)
CO2: 18 mmol/L — ABNORMAL LOW (ref 22–32)
CO2: 21 mmol/L — ABNORMAL LOW (ref 22–32)
CO2: 20 mmol/L — ABNORMAL LOW (ref 22–32)
Calcium: 6.6 mg/dL — ABNORMAL LOW (ref 8.9–10.3)
Calcium: 7.4 mg/dL — ABNORMAL LOW (ref 8.9–10.3)
Calcium: 6.8 mg/dL — ABNORMAL LOW (ref 8.9–10.3)
Chloride: 99 mmol/L (ref 98–111)
Chloride: 98 mmol/L (ref 98–111)
Chloride: 98 mmol/L (ref 98–111)
Creatinine, Ser: 5.39 mg/dL — ABNORMAL HIGH (ref 0.44–1.00)
Creatinine, Ser: 5.5 mg/dL — ABNORMAL HIGH (ref 0.44–1.00)
Creatinine, Ser: 5.93 mg/dL — ABNORMAL HIGH (ref 0.44–1.00)
GFR, Estimated: 8 mL/min — ABNORMAL LOW (ref >60)
GFR, Estimated: 8 mL/min — ABNORMAL LOW (ref >60)
GFR, Estimated: 7 mL/min — ABNORMAL LOW (ref >60)
Glucose, Bld: 144 mg/dL — ABNORMAL HIGH (ref 70–99)
Glucose, Bld: 128 mg/dL — ABNORMAL HIGH (ref 70–99)
Glucose, Bld: 144 mg/dL — ABNORMAL HIGH (ref 70–99)
Potassium: 5.8 mmol/L — ABNORMAL HIGH (ref 3.5–5.1)
Potassium: 5.5 mmol/L — ABNORMAL HIGH (ref 3.5–5.1)
Potassium: 6.2 mmol/L — ABNORMAL HIGH (ref 3.5–5.1)
Sodium: 135 mmol/L (ref 135–145)
Sodium: 135 mmol/L (ref 135–145)
Sodium: 134 mmol/L — ABNORMAL LOW (ref 135–145)

## 2021-10-31 LAB — HEMOGLOBIN AND HEMATOCRIT, BLOOD
HCT: 21 % — ABNORMAL LOW (ref 36.0–46.0)
HCT: 35.3 % — ABNORMAL LOW (ref 36.0–46.0)
HCT: 37.1 % (ref 36.0–46.0)
Hemoglobin: 12.3 g/dL (ref 12.0–15.0)
Hemoglobin: 12.8 g/dL (ref 12.0–15.0)
Hemoglobin: 7.4 g/dL — ABNORMAL LOW (ref 12.0–15.0)

## 2021-10-31 LAB — HEPATIC FUNCTION PANEL
ALT: 713 U/L — ABNORMAL HIGH (ref 0–44)
AST: 711 U/L — ABNORMAL HIGH (ref 15–41)
Albumin: 1.7 g/dL — ABNORMAL LOW (ref 3.5–5.0)
Alkaline Phosphatase: 91 U/L (ref 38–126)
Bilirubin, Direct: 0.6 mg/dL — ABNORMAL HIGH (ref 0.0–0.2)
Indirect Bilirubin: 1 mg/dL — ABNORMAL HIGH (ref 0.3–0.9)
Total Bilirubin: 1.6 mg/dL — ABNORMAL HIGH (ref 0.3–1.2)
Total Protein: 3.7 g/dL — ABNORMAL LOW (ref 6.5–8.1)

## 2021-10-31 LAB — DIC (DISSEMINATED INTRAVASCULAR COAGULATION)PANEL
D-Dimer, Quant: >20 ug/mL-FEU — ABNORMAL HIGH (ref 0.00–0.50)
D-Dimer, Quant: >20 ug/mL-FEU — ABNORMAL HIGH (ref 0.00–0.50)
D-Dimer, Quant: >20 ug/mL-FEU — ABNORMAL HIGH (ref 0.00–0.50)
D-Dimer, Quant: >20 ug/mL-FEU — ABNORMAL HIGH (ref 0.00–0.50)
D-Dimer, Quant: 18.29 ug/mL-FEU — ABNORMAL HIGH (ref 0.00–0.50)
Fibrinogen: 290 mg/dL (ref 210–475)
Fibrinogen: 298 mg/dL (ref 210–475)
Fibrinogen: 319 mg/dL (ref 210–475)
Fibrinogen: 480 mg/dL — ABNORMAL HIGH (ref 210–475)
Fibrinogen: 515 mg/dL — ABNORMAL HIGH (ref 210–475)
INR: 1.6 — ABNORMAL HIGH (ref 0.8–1.2)
INR: 1.6 — ABNORMAL HIGH (ref 0.8–1.2)
INR: 1.4 — ABNORMAL HIGH (ref 0.8–1.2)
INR: 1.3 — ABNORMAL HIGH (ref 0.8–1.2)
INR: 1.3 — ABNORMAL HIGH (ref 0.8–1.2)
Platelets: 60 10*3/uL — ABNORMAL LOW (ref 150–400)
Platelets: 60 10*3/uL — ABNORMAL LOW (ref 150–400)
Platelets: 73 10*3/uL — ABNORMAL LOW (ref 150–400)
Platelets: 59 10*3/uL — ABNORMAL LOW (ref 150–400)
Platelets: 62 10*3/uL — ABNORMAL LOW (ref 150–400)
Prothrombin Time: 18.7 seconds — ABNORMAL HIGH (ref 11.4–15.2)
Prothrombin Time: 18.7 seconds — ABNORMAL HIGH (ref 11.4–15.2)
Prothrombin Time: 16.8 seconds — ABNORMAL HIGH (ref 11.4–15.2)
Prothrombin Time: 16 seconds — ABNORMAL HIGH (ref 11.4–15.2)
Prothrombin Time: 16.5 seconds — ABNORMAL HIGH (ref 11.4–15.2)
Smear Review: NONE SEEN
Smear Review: NONE SEEN
Smear Review: NONE SEEN
Smear Review: NONE SEEN
Smear Review: NONE SEEN
aPTT: 76 seconds — ABNORMAL HIGH (ref 24–36)
aPTT: 71 seconds — ABNORMAL HIGH (ref 24–36)
aPTT: 44 seconds — ABNORMAL HIGH (ref 24–36)
aPTT: 43 seconds — ABNORMAL HIGH (ref 24–36)
aPTT: 38 seconds — ABNORMAL HIGH (ref 24–36)

## 2021-10-31 LAB — CBC
HCT: 26.8 % — ABNORMAL LOW (ref 36.0–46.0)
HCT: 25.9 % — ABNORMAL LOW (ref 36.0–46.0)
HCT: 24.3 % — ABNORMAL LOW (ref 36.0–46.0)
Hemoglobin: 10 g/dL — ABNORMAL LOW (ref 12.0–15.0)
Hemoglobin: 9.2 g/dL — ABNORMAL LOW (ref 12.0–15.0)
Hemoglobin: 8.5 g/dL — ABNORMAL LOW (ref 12.0–15.0)
MCH: 29.3 pg (ref 26.0–34.0)
MCH: 28.2 pg (ref 26.0–34.0)
MCH: 28.1 pg (ref 26.0–34.0)
MCHC: 37.3 g/dL — ABNORMAL HIGH (ref 30.0–36.0)
MCHC: 35.5 g/dL (ref 30.0–36.0)
MCHC: 35 g/dL (ref 30.0–36.0)
MCV: 78.6 fL — ABNORMAL LOW (ref 80.0–100.0)
MCV: 79.4 fL — ABNORMAL LOW (ref 80.0–100.0)
MCV: 80.2 fL (ref 80.0–100.0)
Platelets: 65 10*3/uL — ABNORMAL LOW (ref 150–400)
Platelets: 62 10*3/uL — ABNORMAL LOW (ref 150–400)
Platelets: 60 10*3/uL — ABNORMAL LOW (ref 150–400)
RBC: 3.41 MIL/uL — ABNORMAL LOW (ref 3.87–5.11)
RBC: 3.26 MIL/uL — ABNORMAL LOW (ref 3.87–5.11)
RBC: 3.03 MIL/uL — ABNORMAL LOW (ref 3.87–5.11)
RDW: 16.3 % — ABNORMAL HIGH (ref 11.5–15.5)
RDW: 16.7 % — ABNORMAL HIGH (ref 11.5–15.5)
RDW: 17.2 % — ABNORMAL HIGH (ref 11.5–15.5)
WBC: 18.6 10*3/uL — ABNORMAL HIGH (ref 4.0–10.5)
WBC: 17.9 10*3/uL — ABNORMAL HIGH (ref 4.0–10.5)
WBC: 18.6 10*3/uL — ABNORMAL HIGH (ref 4.0–10.5)
nRBC: 0.2 % (ref 0.0–0.2)
nRBC: 0.3 % — ABNORMAL HIGH (ref 0.0–0.2)
nRBC: 0.3 % — ABNORMAL HIGH (ref 0.0–0.2)

## 2021-10-31 LAB — RENAL FUNCTION PANEL
Albumin: 1.7 g/dL — ABNORMAL LOW (ref 3.5–5.0)
Anion gap: 14 (ref 5–15)
BUN: 51 mg/dL — ABNORMAL HIGH (ref 8–23)
CO2: 21 mmol/L — ABNORMAL LOW (ref 22–32)
Calcium: 6.9 mg/dL — ABNORMAL LOW (ref 8.9–10.3)
Chloride: 99 mmol/L (ref 98–111)
Creatinine, Ser: 5.32 mg/dL — ABNORMAL HIGH (ref 0.44–1.00)
GFR, Estimated: 8 mL/min — ABNORMAL LOW (ref >60)
Glucose, Bld: 134 mg/dL — ABNORMAL HIGH (ref 70–99)
Phosphorus: 5.4 mg/dL — ABNORMAL HIGH (ref 2.5–4.6)
Potassium: 5.6 mmol/L — ABNORMAL HIGH (ref 3.5–5.1)
Sodium: 134 mmol/L — ABNORMAL LOW (ref 135–145)

## 2021-10-31 LAB — POCT I-STAT 7, (LYTES, BLD GAS, ICA,H+H)
Acid-base deficit: 3 mmol/L — ABNORMAL HIGH (ref 0.0–2.0)
Acid-base deficit: 6 mmol/L — ABNORMAL HIGH (ref 0.0–2.0)
Bicarbonate: 18.9 mmol/L — ABNORMAL LOW (ref 20.0–28.0)
Bicarbonate: 21.3 mmol/L (ref 20.0–28.0)
Calcium, Ion: 0.81 mmol/L — CL (ref 1.15–1.40)
Calcium, Ion: 0.93 mmol/L — ABNORMAL LOW (ref 1.15–1.40)
HCT: 20 % — ABNORMAL LOW (ref 36.0–46.0)
HCT: 35 % — ABNORMAL LOW (ref 36.0–46.0)
Hemoglobin: 11.9 g/dL — ABNORMAL LOW (ref 12.0–15.0)
Hemoglobin: 6.8 g/dL — CL (ref 12.0–15.0)
O2 Saturation: 91 %
O2 Saturation: 93 %
Patient temperature: 36.8
Patient temperature: 97.8
Potassium: 5.3 mmol/L — ABNORMAL HIGH (ref 3.5–5.1)
Potassium: 5.7 mmol/L — ABNORMAL HIGH (ref 3.5–5.1)
Sodium: 135 mmol/L (ref 135–145)
Sodium: 135 mmol/L (ref 135–145)
TCO2: 20 mmol/L — ABNORMAL LOW (ref 22–32)
TCO2: 22 mmol/L (ref 22–32)
pCO2 arterial: 32.2 mmHg (ref 32.0–48.0)
pCO2 arterial: 32.2 mmHg (ref 32.0–48.0)
pH, Arterial: 7.375 (ref 7.350–7.450)
pH, Arterial: 7.428 (ref 7.350–7.450)
pO2, Arterial: 57 mmHg — ABNORMAL LOW (ref 83.0–108.0)
pO2, Arterial: 66 mmHg — ABNORMAL LOW (ref 83.0–108.0)

## 2021-10-31 LAB — GLUCOSE, CAPILLARY
Glucose-Capillary: 118 mg/dL — ABNORMAL HIGH (ref 70–99)
Glucose-Capillary: 129 mg/dL — ABNORMAL HIGH (ref 70–99)
Glucose-Capillary: 130 mg/dL — ABNORMAL HIGH (ref 70–99)
Glucose-Capillary: 132 mg/dL — ABNORMAL HIGH (ref 70–99)
Glucose-Capillary: 132 mg/dL — ABNORMAL HIGH (ref 70–99)
Glucose-Capillary: 136 mg/dL — ABNORMAL HIGH (ref 70–99)
Glucose-Capillary: 137 mg/dL — ABNORMAL HIGH (ref 70–99)
Glucose-Capillary: 137 mg/dL — ABNORMAL HIGH (ref 70–99)
Glucose-Capillary: 139 mg/dL — ABNORMAL HIGH (ref 70–99)
Glucose-Capillary: 139 mg/dL — ABNORMAL HIGH (ref 70–99)
Glucose-Capillary: 141 mg/dL — ABNORMAL HIGH (ref 70–99)
Glucose-Capillary: 141 mg/dL — ABNORMAL HIGH (ref 70–99)
Glucose-Capillary: 144 mg/dL — ABNORMAL HIGH (ref 70–99)
Glucose-Capillary: 147 mg/dL — ABNORMAL HIGH (ref 70–99)
Glucose-Capillary: 148 mg/dL — ABNORMAL HIGH (ref 70–99)
Glucose-Capillary: 151 mg/dL — ABNORMAL HIGH (ref 70–99)
Glucose-Capillary: 157 mg/dL — ABNORMAL HIGH (ref 70–99)
Glucose-Capillary: 165 mg/dL — ABNORMAL HIGH (ref 70–99)

## 2021-10-31 LAB — LACTIC ACID, PLASMA
Lactic Acid, Venous: 3.9 mmol/L (ref 0.5–1.9)
Lactic Acid, Venous: 3.3 mmol/L (ref 0.5–1.9)
Lactic Acid, Venous: 2.5 mmol/L (ref 0.5–1.9)
Lactic Acid, Venous: 2.3 mmol/L (ref 0.5–1.9)

## 2021-10-31 LAB — MAGNESIUM
Magnesium: 2 mg/dL (ref 1.7–2.4)
Magnesium: 1.9 mg/dL (ref 1.7–2.4)

## 2021-10-31 LAB — FIBRINOGEN: Fibrinogen: 318 mg/dL (ref 210–475)

## 2021-10-31 LAB — PHOSPHORUS
Phosphorus: 6.3 mg/dL — ABNORMAL HIGH (ref 2.5–4.6)
Phosphorus: 5.8 mg/dL — ABNORMAL HIGH (ref 2.5–4.6)

## 2021-10-31 LAB — PATHOLOGIST SMEAR REVIEW

## 2021-10-31 MED ORDER — PRISMASOL BGK 0/2.5 32-2.5 MEQ/L EC SOLN
Status: DC
  Filled 2021-10-31 (×3): qty 5000

## 2021-10-31 MED ORDER — HEPARIN SODIUM (PORCINE) 1000 UNIT/ML DIALYSIS
1000.0000 [IU] | INTRAMUSCULAR | Status: DC | PRN
  Administered 2021-11-01 – 2021-11-07 (×2): 2800 [IU] via INTRAVENOUS_CENTRAL
  Filled 2021-10-31 (×5): qty 6

## 2021-10-31 MED ORDER — SODIUM CHLORIDE 0.9 % IV SOLN
4.0000 g | Freq: Once | INTRAVENOUS | Status: AC
  Administered 2021-10-31: 4 g via INTRAVENOUS
  Filled 2021-10-31: qty 40

## 2021-10-31 MED ORDER — VANCOMYCIN HCL 750 MG/150ML IV SOLN: 750.0000 mg | INTRAVENOUS | Status: DC

## 2021-10-31 MED ORDER — SODIUM CHLORIDE 0.9% IV SOLUTION: Freq: Once | INTRAVENOUS | Status: DC

## 2021-10-31 MED ORDER — DEXMEDETOMIDINE HCL IN NACL 400 MCG/100ML IV SOLN
0.4000 ug/kg/h | INTRAVENOUS | Status: DC
  Administered 2021-10-31: 0.5 ug/kg/h via INTRAVENOUS
  Administered 2021-11-03: 0.3 ug/kg/h via INTRAVENOUS
  Administered 2021-11-03: 0.4 ug/kg/h via INTRAVENOUS
  Filled 2021-10-31 (×3): qty 100

## 2021-10-31 MED ORDER — CALCIUM GLUCONATE-NACL 2-0.675 GM/100ML-% IV SOLN
2.0000 g | Freq: Once | INTRAVENOUS | Status: AC
  Administered 2021-11-01: 2000 mg via INTRAVENOUS
  Filled 2021-10-31: qty 100

## 2021-10-31 MED ORDER — SODIUM CHLORIDE 0.9% IV SOLUTION: Freq: Once | INTRAVENOUS | Status: AC

## 2021-10-31 MED ORDER — SODIUM CHLORIDE 0.9 % FOR CRRT: INTRAVENOUS_CENTRAL | Status: DC | PRN

## 2021-10-31 MED ORDER — SODIUM CHLORIDE 0.9% FLUSH: 10.0000 mL | INTRAVENOUS | Status: DC | PRN

## 2021-10-31 MED ORDER — B COMPLEX-C PO TABS
1.0000 | ORAL_TABLET | Freq: Every day | ORAL | Status: DC
  Administered 2021-10-31 – 2021-11-06 (×7): 1
  Filled 2021-10-31 (×7): qty 1

## 2021-10-31 MED ORDER — WHITE PETROLATUM EX OINT
TOPICAL_OINTMENT | CUTANEOUS | Status: DC | PRN
  Filled 2021-10-31: qty 28.35

## 2021-10-31 MED ORDER — PRISMASOL BGK 0/2.5 32-2.5 MEQ/L EC SOLN
Status: DC
  Filled 2021-10-31 (×44): qty 5000

## 2021-10-31 MED ORDER — PRISMASOL BGK 0/2.5 32-2.5 MEQ/L EC SOLN
Status: DC
  Filled 2021-10-31 (×5): qty 5000

## 2021-10-31 MED ORDER — SODIUM CHLORIDE 0.9% FLUSH
10.0000 mL | Freq: Two times a day (BID) | INTRAVENOUS | Status: DC
  Administered 2021-10-31 – 2021-11-10 (×15): 10 mL

## 2021-10-31 MED ORDER — SODIUM ZIRCONIUM CYCLOSILICATE 10 G PO PACK
10.0000 g | PACK | Freq: Once | ORAL | Status: AC
  Administered 2021-10-31: 10 g
  Filled 2021-10-31: qty 1

## 2021-10-31 MED ORDER — METRONIDAZOLE 500 MG/100ML IV SOLN
500.0000 mg | Freq: Two times a day (BID) | INTRAVENOUS | Status: DC
  Administered 2021-10-31 – 2021-11-04 (×9): 500 mg via INTRAVENOUS
  Filled 2021-10-31 (×9): qty 100

## 2021-10-31 NOTE — Consult Note (Signed)
Reason for Consult: Renal failure Referring Physician:  Dr. Loanne Drilling  Chief Complaint: LOC  Assessment/Plan: Renal failure secondary to ATN in the setting of cardiac arrest, hypotension, pressors, contrast. She is certainly moving towards RRT. BL CKD3A with BL creatinine in the 1.17-1.3 range in late 2021. - Will check on her later this afternoon; she has received Lokelma but with her level of renal output (low oliguirc range) almost certainly will need to start CRRT later today. - Appreciate CCM placing trialysis catheter in the left femoral vein. -Monitor Daily I/Os, Daily weight  -Maintain MAP>65 for optimal renal perfusion. Still on high dose Levophed but Epinephrine being weaned down. -Avoid nephrotoxic medications including NSAIDs - Dose medications for GFR <15 and adjust when she's on CRRT  PE s/p mechanical thrombectomy complicated by large bleed. Anemia s/p transfusions in DIC s/p Vitamin K, platelets and FFP + PRBC. DM HTN Respiratory failure on nebulizers, ventilator    HPI: IAN CAVEY is an 71 y.o. female HTN HLD  DM asthma fibromyalgia with a presyncopal episode at home but then lost consciousness before EMS arrived. She was found to be in VF and had 20 minutes of CPR + coded twice in the ED for 4 and 2 minutes. She required multiple pushes of epinephrine for bradycardia and hypotension. Daughter was bedside and stated that she was in her usual state of health but c/o lightheadedness only on the day of admission. Found to have saddle embolus in LLL with 79m Omnipaque on 1/1 and also had another 1028mof Ominipaque on 1/1 for a CT abdomen pelvis. Patient also treated with Vancomycin + vasopressin + epinephrine + Levophed. Patient underwent mechanical thrombectomy but later had a drop in Hb to 5 with a large peritoneal and parenchymal hemorrhage requiring  5U PRBC, FFP and platelets. Patient has moved into the  oliguric range with hyperkalemia and worsening renal function. SHe has  CKD with slowly rising creatining since 2018 with BL cr in the 1.17-1.3 range.  ROS Pertinent items are noted in HPI.  Chemistry and CBC: Creat  Date/Time Value Ref Range Status  03/06/2017 03:02 PM 1.14 (H) 0.50 - 0.99 mg/dL Final    Comment:      For patients > or = 5039ears of age: The upper reference limit for Creatinine is approximately 13% higher for people identified as African-American.      Creatinine, Ser  Date/Time Value Ref Range Status  10/31/2021 05:31 AM 5.50 (H) 0.44 - 1.00 mg/dL Final  10/31/2021 12:26 AM 5.39 (H) 0.44 - 1.00 mg/dL Final  10/30/2021 04:00 AM 4.75 (H) 0.44 - 1.00 mg/dL Final  10/29/2021 07:49 PM 4.15 (H) 0.44 - 1.00 mg/dL Final  10/29/2021 12:40 PM 3.85 (H) 0.44 - 1.00 mg/dL Final  10/29/2021 08:40 AM 3.72 (H) 0.44 - 1.00 mg/dL Final  10/29/2021 05:03 AM 3.52 (H) 0.44 - 1.00 mg/dL Final  10/29/2021 01:06 AM 3.18 (H) 0.44 - 1.00 mg/dL Final  10/28/2021 08:36 PM 2.69 (H) 0.44 - 1.00 mg/dL Final    Comment:    DELTA CHECK NOTED  10/28/2021 01:24 PM 1.10 (H) 0.44 - 1.00 mg/dL Final  10/28/2021 01:11 PM 1.10 (H) 0.44 - 1.00 mg/dL Final  10/23/2020 04:50 PM 1.17 (H) 0.44 - 1.00 mg/dL Final  04/11/2019 11:21 AM 1.24 (H) 0.44 - 1.00 mg/dL Final  04/02/2019 02:07 AM 1.17 (H) 0.44 - 1.00 mg/dL Final  08/11/2012 12:10 PM 0.89 0.50 - 1.10 mg/dL Final   Recent Labs  Lab 10/29/21 0503 10/29/21  0510 10/29/21 0840 10/29/21 1240 10/29/21 1949 10/30/21 0400 10/30/21 0803 10/30/21 1356 10/30/21 1357 10/30/21 1723 10/30/21 2245 10/31/21 0026 10/31/21 0034 10/31/21 0531  NA 141   < > 143 142 140 138 135 133*  --  132* 133* 135 135 135  K 3.1*   < > 3.1* 2.9* 3.7 4.4 4.9 5.6* 5.8* 5.6* 5.7* 5.8* 5.7* 5.5*  CL 103  --  101 102 103 99  --   --   --   --   --  99  --  98  CO2 17*  --  _0 --   --   --   --   --  18*  --  21*  GLUCOSE 545*  --  465* 318* 91 111*  --   --   --   --   --  144*  --  128*  BUN 35*  --  38* 40* 41* 41*  --   --    --   --   --  50*  --  51*  CREATININE 3.52*  --  3.72* 3.85* 4.15* 4.75*  --   --   --   --   --  5.39*  --  5.50*  CALCIUM 8.0*  --  7.9* 7.7* 7.2* 6.5*  --   --   --   --   --  6.6*  --  7.4*  PHOS 3.6  --   --   --   --  2.3*  --   --  2.5  --   --   --   --  6.3*   < > = values in this interval not displayed.   Recent Labs  Lab 10/30/21 0100 10/30/21 0400 10/30/21 0803 10/30/21 0938 10/30/21 1356 10/30/21 1357 10/30/21 1721 10/30/21 1757 10/30/21 2141 10/30/21 2245 10/31/21 0026 10/31/21 0034 10/31/21 0153 10/31/21 0531  WBC 19.4* 21.4*  --  20.2*  --  21.2*  --  24.6*  --   --   --   --   --   --   NEUTROABS 16.5* 18.2*  --  17.3*  --  17.9*  --   --   --   --   --   --   --   --   HGB 10.5* 9.8*   < > 9.5*   < > 8.3*   < > 6.9* 11.2* 11.6* 12.8 11.9* 12.3  --   HCT 29.3* 28.9*   < > 27.0*   < > 22.4*   < > 18.9* 32.5* 34.0* 37.1 35.0* 35.3*  --   MCV 77.9* 78.5*  --  78.5*  --  77.8*  --  80.8  --   --   --   --   --   --   PLT 101* 101*   102*  --  92*  --  100*   < > 101* 84*  --  60*  --  60* 73*   < > = values in this interval not displayed.   Liver Function Tests: Recent Labs  Lab 10/29/21 0503 10/30/21 0400 10/31/21 0531  AST 2,275*   2,252* 2,025* 711*  ALT 1,210*   1,233* 1,540* 713*  ALKPHOS 86   86 92 91  BILITOT 1.2   1.3* 1.4* 1.6*  PROT 4.5*   4.8* 3.9* 3.7*  ALBUMIN 2.1*   2.1* 1.7* 1.7*   No results for input(s): LIPASE, AMYLASE in the last  168 hours. No results for input(s): AMMONIA in the last 168 hours. Cardiac Enzymes: No results for input(s): CKTOTAL, CKMB, CKMBINDEX, TROPONINI in the last 168 hours. Iron Studies: No results for input(s): IRON, TIBC, TRANSFERRIN, FERRITIN in the last 72 hours. PT/INR: _0 (inr:5)  Xrays/Other Studies: ) Results for orders placed or performed during the hospital encounter of 10/28/21 (from the past 48 hour(s))  Glucose, capillary     Status: Abnormal   Collection Time: 10/29/21  9:12 AM  Result  Value Ref Range   Glucose-Capillary 400 (H) 70 - 99 mg/dL    Comment: Glucose reference range applies only to samples taken after fasting for at least 8 hours.  Glucose, capillary     Status: Abnormal   Collection Time: 10/29/21  9:53 AM  Result Value Ref Range   Glucose-Capillary 358 (H) 70 - 99 mg/dL    Comment: Glucose reference range applies only to samples taken after fasting for at least 8 hours.  Glucose, capillary     Status: Abnormal   Collection Time: 10/29/21 11:01 AM  Result Value Ref Range   Glucose-Capillary 362 (H) 70 - 99 mg/dL    Comment: Glucose reference range applies only to samples taken after fasting for at least 8 hours.  Lactic acid, plasma     Status: Abnormal   Collection Time: 10/29/21 12:05 PM  Result Value Ref Range   Lactic Acid, Venous 5.0 (HH) 0.5 - 1.9 mmol/L    Comment: CRITICAL VALUE NOTED.  VALUE IS CONSISTENT WITH PREVIOUSLY REPORTED AND CALLED VALUE. Performed at [facility], [place] [address] [place], [place] [address]   Glucose, capillary     Status: Abnormal   Collection Time: 10/29/21 12:05 PM  Result Value Ref Range   Glucose-Capillary 325 (H) 70 - 99 mg/dL    Comment: Glucose reference range applies only to samples taken after fasting for at least 8 hours.  Brain natriuretic peptide     Status: Abnormal   Collection Time: 10/29/21 12:40 PM  Result Value Ref Range   B Natriuretic Peptide 1,125.8 (H) 0.0 - 100.0 pg/mL    Comment: Performed at [facility] [address] [place], [place] [address]  Heparin level (unfractionated)     Status: Abnormal   Collection Time: 10/29/21 12:40 PM  Result Value Ref Range   Heparin Unfractionated 0.16 (L) 0.30 - 0.70 IU/mL    Comment: (NOTE) The clinical reportable range upper limit is being lowered to >1.10 to align with the FDA approved guidance for the current laboratory assay.  If heparin results are below expected values, and patient dosage has  been confirmed, suggest follow up  testing of antithrombin III levels. Performed at [facility], [place] [address] [place], [place] [address]   CBC with Differential/Platelet     Status: Abnormal   Collection Time: 10/29/21 12:40 PM  Result Value Ref Range   WBC 19.7 (H) 4.0 - 10.5 K/uL   RBC 4.16 3.87 - 5.11 MIL/uL   Hemoglobin 11.2 (L) 12.0 - 15.0 g/dL   HCT 32.9 (L) 36.0 - 46.0 %   MCV 79.1 (L) 80.0 - 100.0 fL   MCH 26.9 26.0 - 34.0 pg   MCHC 34.0 30.0 - 36.0 g/dL   RDW 14.1 11.5 - 15.5 %   Platelets 156 150 - 400 K/uL   nRBC 0.0 0.0 - 0.2 %   Neutrophils Relative % 85 %   Neutro Abs 16.8 (H) 1.7 - 7.7 K/uL  Lymphocytes Relative 10 %   Lymphs Abs 2.0 0.7 - 4.0 K/uL   Monocytes Relative 2 %   Monocytes Absolute 0.4 0.1 - 1.0 K/uL   Eosinophils Relative 1 %   Eosinophils Absolute 0.2 0.0 - 0.5 K/uL   Basophils Relative 0 %   Basophils Absolute 0.0 0.0 - 0.1 K/uL   Immature Granulocytes 2 %   Abs Immature Granulocytes 0.29 (H) 0.00 - 0.07 K/uL    Comment: Performed at [facility] [address], [place], [place] [address]  Basic metabolic panel     Status: Abnormal   Collection Time: 10/29/21 12:40 PM  Result Value Ref Range   Sodium 142 135 - 145 mmol/L   Potassium 2.9 (L) 3.5 - 5.1 mmol/L   Chloride 102 98 - 111 mmol/L   CO2 24 22 - 32 mmol/L   Glucose, Bld 318 (H) 70 - 99 mg/dL    Comment: Glucose reference range applies only to samples taken after fasting for at least 8 hours.   BUN 40 (H) 8 - 23 mg/dL   Creatinine, Ser 3.85 (H) 0.44 - 1.00 mg/dL   Calcium 7.7 (L) 8.9 - 10.3 mg/dL   GFR, Estimated 12 (L) >60 mL/min    Comment: (NOTE) Calculated using the CKD-EPI Creatinine Equation (2021)    Anion gap 16 (H) 5 - 15    Comment: Performed at [facility] [address], [place], [place] [address]  Troponin I (High Sensitivity)     Status: Abnormal   Collection Time: 10/29/21 12:40 PM  Result Value Ref Range   Troponin I (High Sensitivity) 14,724 (HH) <18 ng/L    Comment:  CRITICAL VALUE NOTED.  VALUE IS CONSISTENT WITH PREVIOUSLY REPORTED AND CALLED VALUE. (NOTE) Elevated high sensitivity troponin I (hsTnI) values and significant  changes across serial measurements may suggest ACS but many other  chronic and acute conditions are known to elevate hsTnI results.  Refer to the Links section for chest pain algorithms and additional  guidance. Performed at [facility], [place] [address] [place], [place] [address]   Glucose, capillary     Status: Abnormal   Collection Time: 10/29/21  1:18 PM  Result Value Ref Range   Glucose-Capillary 268 (H) 70 - 99 mg/dL    Comment: Glucose reference range applies only to samples taken after fasting for at least 8 hours.  Glucose, capillary     Status: Abnormal   Collection Time: 10/29/21  2:39 PM  Result Value Ref Range   Glucose-Capillary 231 (H) 70 - 99 mg/dL    Comment: Glucose reference range applies only to samples taken after fasting for at least 8 hours.  POCT Activated clotting time     Status: None   Collection Time: 10/29/21  3:12 PM  Result Value Ref Range   Activated Clotting Time 173 seconds    Comment: Reference range 74-137 seconds for patients not on anticoagulant therapy.  POCT Activated clotting time     Status: None   Collection Time: 10/29/21  3:32 PM  Result Value Ref Range   Activated Clotting Time 245 seconds    Comment: Reference range 74-137 seconds for patients not on anticoagulant therapy.  Glucose, capillary     Status: Abnormal   Collection Time: 10/29/21  4:35 PM  Result Value Ref Range   Glucose-Capillary 155 (H) 70 - 99 mg/dL    Comment: Glucose reference range applies only to samples taken after fasting for at least  8 hours.  CBC with Differential/Platelet     Status: Abnormal   Collection Time: 10/29/21  4:46 PM  Result Value Ref Range   WBC 18.3 (H) 4.0 - 10.5 K/uL   RBC 3.79 (L) 3.87 - 5.11 MIL/uL   Hemoglobin 10.4 (L) 12.0 - 15.0 g/dL   HCT 29.7 (L) 36.0 - 46.0 %   MCV  78.4 (L) 80.0 - 100.0 fL   MCH 27.4 26.0 - 34.0 pg   MCHC 35.0 30.0 - 36.0 g/dL   RDW 14.1 11.5 - 15.5 %   Platelets 130 (L) 150 - 400 K/uL    Comment: SPECIMEN CHECKED FOR CLOTS REPEATED TO VERIFY PLATELET COUNT CONFIRMED BY SMEAR    nRBC 0.1 0.0 - 0.2 %   Neutrophils Relative % 93 %   Neutro Abs 17.0 (H) 1.7 - 7.7 K/uL   Lymphocytes Relative 5 %   Lymphs Abs 0.9 0.7 - 4.0 K/uL   Monocytes Relative 2 %   Monocytes Absolute 0.4 0.1 - 1.0 K/uL   Eosinophils Relative 0 %   Eosinophils Absolute 0.0 0.0 - 0.5 K/uL   Basophils Relative 0 %   Basophils Absolute 0.0 0.0 - 0.1 K/uL   WBC Morphology WHITE COUNT CONFIRMED ON SMEAR    RBC Morphology MORPHOLOGY UNREMARKABLE    nRBC 0 0 /100 WBC   Abs Immature Granulocytes 0.00 0.00 - 0.07 K/uL    Comment: Performed at [facility], [address] [place], [place] [address]  DIC Panel 5A & 5P     Status: Abnormal   Collection Time: 10/29/21  4:46 PM  Result Value Ref Range   Prothrombin Time 24.8 (H) 11.4 - 15.2 seconds   INR 2.2 (H) 0.8 - 1.2    Comment: (NOTE) INR goal varies based on device and disease states.    aPTT 198 (HH) 24 - 36 seconds    Comment:        IF BASELINE aPTT IS ELEVATED, SUGGEST PATIENT RISK ASSESSMENT BE USED TO DETERMINE APPROPRIATE ANTICOAGULANT THERAPY. REPEATED TO VERIFY CRITICAL RESULT CALLED TO, READ BACK BY AND VERIFIED WITH: J CREED,RN 1946 10/29/2021 WBOND    Fibrinogen 93 (LL) 210 - 475 mg/dL    Comment: REPEATED TO VERIFY CRITICAL RESULT CALLED TO, READ BACK BY AND VERIFIED WITH: J CREED,RN 1946 10/29/2021 WBOND (NOTE) Fibrinogen results may be underestimated in patients receiving thrombolytic therapy.    D-Dimer, Quant >20.00 (H) 0.00 - 0.50 ug/mL-FEU    Comment: (NOTE) At the manufacturer cut-off value of 0.5 g/mL FEU, this assay has a negative predictive value of 95-100%.This assay is intended for use in conjunction with a clinical pretest probability (PTP) assessment model to  exclude pulmonary embolism (PE) and deep venous thrombosis (DVT) in outpatients suspected of PE or DVT. Results should be correlated with clinical presentation.    Platelets 132 (L) 150 - 400 K/uL    Comment: REPEATED TO VERIFY   Smear Review NO SCHISTOCYTES SEEN     Comment: Performed at [facility], [place] [address], [place], [place] [address]  Glucose, capillary     Status: Abnormal   Collection Time: 10/29/21  6:03 PM  Result Value Ref Range   Glucose-Capillary 124 (H) 70 - 99 mg/dL    Comment: Glucose reference range applies only to samples taken after fasting for at least 8 hours.  Brain natriuretic peptide     Status: Abnormal   Collection Time: 10/29/21  7:00 PM  Result Value Ref  Range   B Natriuretic Peptide 1,141.5 (H) 0.0 - 100.0 pg/mL    Comment: Performed at [facility] [address], [place], [place] [address]  Glucose, capillary     Status: None   Collection Time: 10/29/21  7:39 PM  Result Value Ref Range   Glucose-Capillary 86 70 - 99 mg/dL    Comment: Glucose reference range applies only to samples taken after fasting for at least 8 hours.  CBC with Differential/Platelet     Status: Abnormal   Collection Time: 10/29/21  7:49 PM  Result Value Ref Range   WBC 20.5 (H) 4.0 - 10.5 K/uL   RBC 3.94 3.87 - 5.11 MIL/uL   Hemoglobin 10.6 (L) 12.0 - 15.0 g/dL   HCT 31.0 (L) 36.0 - 46.0 %   MCV 78.7 (L) 80.0 - 100.0 fL   MCH 26.9 26.0 - 34.0 pg   MCHC 34.2 30.0 - 36.0 g/dL   RDW 14.0 11.5 - 15.5 %   Platelets 122 (L) 150 - 400 K/uL   nRBC 0.1 0.0 - 0.2 %   Neutrophils Relative % 82 %   Neutro Abs 16.8 (H) 1.7 - 7.7 K/uL   Lymphocytes Relative 17 %   Lymphs Abs 3.5 0.7 - 4.0 K/uL   Monocytes Relative 0 %   Monocytes Absolute 0.0 (L) 0.1 - 1.0 K/uL   Eosinophils Relative 0 %   Eosinophils Absolute 0.0 0.0 - 0.5 K/uL   Basophils Relative 1 %   Basophils Absolute 0.2 (H) 0.0 - 0.1 K/uL   nRBC 2 (H) 0 /100 WBC   Abs Immature Granulocytes 0.00 0.00 - 0.07  K/uL    Comment: Performed at [facility], [address], [place], [place] [address]  Basic metabolic panel     Status: Abnormal   Collection Time: 10/29/21  7:49 PM  Result Value Ref Range   Sodium 140 135 - 145 mmol/L   Potassium 3.7 3.5 - 5.1 mmol/L   Chloride 103 98 - 111 mmol/L   CO2 24 22 - 32 mmol/L   Glucose, Bld 91 70 - 99 mg/dL    Comment: Glucose reference range applies only to samples taken after fasting for at least 8 hours.   BUN 41 (H) 8 - 23 mg/dL   Creatinine, Ser 4.15 (H) 0.44 - 1.00 mg/dL   Calcium 7.2 (L) 8.9 - 10.3 mg/dL   GFR, Estimated 11 (L) >60 mL/min    Comment: (NOTE) Calculated using the CKD-EPI Creatinine Equation (2021)    Anion gap 13 5 - 15    Comment: Performed at [facility] [address], [place], [place] [address]  Heparin level (unfractionated)     Status: Abnormal   Collection Time: 10/29/21  9:05 PM  Result Value Ref Range   Heparin Unfractionated 0.16 (L) 0.30 - 0.70 IU/mL    Comment: (NOTE) The clinical reportable range upper limit is being lowered to >1.10 to align with the FDA approved guidance for the current laboratory assay.  If heparin results are below expected values, and patient dosage has  been confirmed, suggest follow up testing of antithrombin III levels. Performed at [facility], [place] [address] [place], [place] [address]   Glucose, capillary     Status: None   Collection Time: 10/29/21  9:06 PM  Result Value Ref Range   Glucose-Capillary 85 70 - 99 mg/dL    Comment: Glucose reference range applies only to samples taken after fasting  for at least 8 hours.  Glucose, capillary     Status: None   Collection Time: 10/29/21 10:04 PM  Result Value Ref Range   Glucose-Capillary 83 70 - 99 mg/dL    Comment: Glucose reference range applies only to samples taken after fasting for at least 8 hours.  Glucose, capillary     Status: None   Collection Time: 10/29/21 10:56 PM  Result Value Ref Range    Glucose-Capillary 89 70 - 99 mg/dL    Comment: Glucose reference range applies only to samples taken after fasting for at least 8 hours.  Glucose, capillary     Status: None   Collection Time: 10/29/21 11:57 PM  Result Value Ref Range   Glucose-Capillary 96 70 - 99 mg/dL    Comment: Glucose reference range applies only to samples taken after fasting for at least 8 hours.  Brain natriuretic peptide     Status: Abnormal   Collection Time: 10/30/21  1:00 AM  Result Value Ref Range   B Natriuretic Peptide 877.3 (H) 0.0 - 100.0 pg/mL    Comment: Performed at [facility] [address], [place], [place] [address]  CBC with Differential/Platelet     Status: Abnormal   Collection Time: 10/30/21  1:00 AM  Result Value Ref Range   WBC 19.4 (H) 4.0 - 10.5 K/uL   RBC 3.76 (L) 3.87 - 5.11 MIL/uL   Hemoglobin 10.5 (L) 12.0 - 15.0 g/dL   HCT 29.3 (L) 36.0 - 46.0 %   MCV 77.9 (L) 80.0 - 100.0 fL   MCH 27.9 26.0 - 34.0 pg   MCHC 35.8 30.0 - 36.0 g/dL   RDW 14.2 11.5 - 15.5 %   Platelets 101 (L) 150 - 400 K/uL    Comment: Immature Platelet Fraction may be clinically indicated, consider ordering this additional test [id number] CONSISTENT WITH PREVIOUS RESULT REPEATED TO VERIFY    nRBC 0.1 0.0 - 0.2 %   Neutrophils Relative % 85 %   Neutro Abs 16.5 (H) 1.7 - 7.7 K/uL   Lymphocytes Relative 11 %   Lymphs Abs 2.1 0.7 - 4.0 K/uL   Monocytes Relative 2 %   Monocytes Absolute 0.5 0.1 - 1.0 K/uL   Eosinophils Relative 0 %   Eosinophils Absolute 0.0 0.0 - 0.5 K/uL   Basophils Relative 0 %   Basophils Absolute 0.1 0.0 - 0.1 K/uL   Immature Granulocytes 2 %   Abs Immature Granulocytes 0.29 (H) 0.00 - 0.07 K/uL    Comment: Performed at [facility] [address], [place], [place] [address]  Glucose, capillary     Status: None   Collection Time: 10/30/21  1:00 AM  Result Value Ref Range   Glucose-Capillary 95 70 - 99 mg/dL    Comment: Glucose reference range applies only to samples  taken after fasting for at least 8 hours.  Glucose, capillary     Status: Abnormal   Collection Time: 10/30/21  2:05 AM  Result Value Ref Range   Glucose-Capillary 102 (H) 70 - 99 mg/dL    Comment: Glucose reference range applies only to samples taken after fasting for at least 8 hours.  Glucose, capillary     Status: Abnormal   Collection Time: 10/30/21  2:58 AM  Result Value Ref Range   Glucose-Capillary 117 (H) 70 - 99 mg/dL    Comment: Glucose reference range applies only to samples taken after fasting for at least 8 hours.  Glucose, capillary  Status: Abnormal   Collection Time: 10/30/21  3:59 AM  Result Value Ref Range   Glucose-Capillary 114 (H) 70 - 99 mg/dL    Comment: Glucose reference range applies only to samples taken after fasting for at least 8 hours.  Magnesium     Status: Abnormal   Collection Time: 10/30/21  4:00 AM  Result Value Ref Range   Magnesium 1.6 (L) 1.7 - 2.4 mg/dL    Comment: Performed at [facility] [address], [place], [place] [address]  Phosphorus     Status: Abnormal   Collection Time: 10/30/21  4:00 AM  Result Value Ref Range   Phosphorus 2.3 (L) 2.5 - 4.6 mg/dL    Comment: Performed at [facility] [address], [place], [place] [address]  Hepatic function panel     Status: Abnormal   Collection Time: 10/30/21  4:00 AM  Result Value Ref Range   Total Protein 3.9 (L) 6.5 - 8.1 g/dL   Albumin 1.7 (L) 3.5 - 5.0 g/dL   AST 2,025 (H) 15 - 41 U/L   ALT 1,540 (H) 0 - 44 U/L   Alkaline Phosphatase 92 38 - 126 U/L   Total Bilirubin 1.4 (H) 0.3 - 1.2 mg/dL   Bilirubin, Direct 0.6 (H) 0.0 - 0.2 mg/dL   Indirect Bilirubin 0.8 0.3 - 0.9 mg/dL    Comment: Performed at [facility], [place] [address] [place], [place] [address]  Procalcitonin     Status: None   Collection Time: 10/30/21  4:00 AM  Result Value Ref Range   Procalcitonin >150.00 ng/mL    Comment:        Interpretation: PCT >= 10 ng/mL: Important systemic  inflammatory response, almost exclusively due to severe bacterial sepsis or septic shock. (NOTE)       Sepsis PCT Algorithm           Lower Respiratory Tract                                      Infection PCT Algorithm    ----------------------------     ----------------------------         PCT < 0.25 ng/mL                PCT < 0.10 ng/mL          Strongly encourage             Strongly discourage   discontinuation of antibiotics    initiation of antibiotics    ----------------------------     -----------------------------       PCT 0.25 - 0.50 ng/mL            PCT 0.10 - 0.25 ng/mL               OR       >80% decrease in PCT            Discourage initiation of                                            antibiotics      Encourage discontinuation           of antibiotics    ----------------------------     -----------------------------  PCT >= 0.50 ng/mL              PCT 0.26 - 0.50 ng/mL                AND       <80% decrease in PCT             Encourage initiation of                                             antibiotics       Encourage continuation           of antibiotics    ----------------------------     -----------------------------        PCT >= 0.50 ng/mL                  PCT > 0.50 ng/mL               AND         increase in PCT                  Strongly encourage                                      initiation of antibiotics    Strongly encourage escalation           of antibiotics                                     -----------------------------                                           PCT <= 0.25 ng/mL                                                 OR                                        > 80% decrease in PCT                                      Discontinue / Do not initiate                                             antibiotics  Performed at [facility], [address] [place], [place] [address]   Basic metabolic panel     Status: Abnormal    Collection Time: 10/30/21  4:00 AM  Result Value Ref Range   Sodium 138 135 - 145 mmol/L   Potassium 4.4 3.5 - 5.1 mmol/L   Chloride 99 98 - 111 mmol/L  CO2 25 22 - 32 mmol/L   Glucose, Bld 111 (H) 70 - 99 mg/dL    Comment: Glucose reference range applies only to samples taken after fasting for at least 8 hours.   BUN 41 (H) 8 - 23 mg/dL   Creatinine, Ser 4.75 (H) 0.44 - 1.00 mg/dL   Calcium 6.5 (L) 8.9 - 10.3 mg/dL   GFR, Estimated 9 (L) >60 mL/min    Comment: (NOTE) Calculated using the CKD-EPI Creatinine Equation (2021)    Anion gap 14 5 - 15    Comment: Performed at [facility] [address], [place], [place] [address]  CBC with Differential/Platelet     Status: Abnormal   Collection Time: 10/30/21  4:00 AM  Result Value Ref Range   WBC 21.4 (H) 4.0 - 10.5 K/uL   RBC 3.68 (L) 3.87 - 5.11 MIL/uL   Hemoglobin 9.8 (L) 12.0 - 15.0 g/dL   HCT 28.9 (L) 36.0 - 46.0 %   MCV 78.5 (L) 80.0 - 100.0 fL   MCH 26.6 26.0 - 34.0 pg   MCHC 33.9 30.0 - 36.0 g/dL   RDW 14.3 11.5 - 15.5 %   Platelets 102 (L) 150 - 400 K/uL    Comment: Immature Platelet Fraction may be clinically indicated, consider ordering this additional test [id number] CONSISTENT WITH PREVIOUS RESULT REPEATED TO VERIFY    nRBC 0.1 0.0 - 0.2 %   Neutrophils Relative % 86 %   Neutro Abs 18.2 (H) 1.7 - 7.7 K/uL   Lymphocytes Relative 10 %   Lymphs Abs 2.1 0.7 - 4.0 K/uL   Monocytes Relative 2 %   Monocytes Absolute 0.5 0.1 - 1.0 K/uL   Eosinophils Relative 0 %   Eosinophils Absolute 0.1 0.0 - 0.5 K/uL   Basophils Relative 0 %   Basophils Absolute 0.1 0.0 - 0.1 K/uL   Immature Granulocytes 2 %   Abs Immature Granulocytes 0.45 (H) 0.00 - 0.07 K/uL    Comment: Performed at [facility] [address], [place], [place] [address]  DIC Panel 5A & 5P     Status: Abnormal   Collection Time: 10/30/21  4:00 AM  Result Value Ref Range   Prothrombin Time 21.5 (H) 11.4 - 15.2 seconds   INR 1.9 (H) 0.8 - 1.2     Comment: (NOTE) INR goal varies based on device and disease states.    aPTT 49 (H) 24 - 36 seconds    Comment:        IF BASELINE aPTT IS ELEVATED, SUGGEST PATIENT RISK ASSESSMENT BE USED TO DETERMINE APPROPRIATE ANTICOAGULANT THERAPY.    Fibrinogen 243 210 - 475 mg/dL    Comment: (NOTE) Fibrinogen results may be underestimated in patients receiving thrombolytic therapy.    D-Dimer, Quant >20.00 (H) 0.00 - 0.50 ug/mL-FEU    Comment: (NOTE) At the manufacturer cut-off value of 0.5 g/mL FEU, this assay has a negative predictive value of 95-100%.This assay is intended for use in conjunction with a clinical pretest probability (PTP) assessment model to exclude pulmonary embolism (PE) and deep venous thrombosis (DVT) in outpatients suspected of PE or DVT. Results should be correlated with clinical presentation.    Platelets 101 (L) 150 - 400 K/uL    Comment: Immature Platelet Fraction may be clinically indicated, consider ordering this additional test [id number] CONSISTENT WITH PREVIOUS RESULT REPEATED TO VERIFY    Smear Review NO SCHISTOCYTES SEEN     Comment: Performed at [facility], [address] [place], [place] [address]  Glucose, capillary     Status: None   Collection Time: 10/30/21  4:58 AM  Result Value Ref Range   Glucose-Capillary 99 70 - 99 mg/dL    Comment: Glucose reference range applies only to samples taken after fasting for at least 8 hours.  Glucose, capillary     Status: Abnormal   Collection Time: 10/30/21  6:04 AM  Result Value Ref Range   Glucose-Capillary 124 (H) 70 - 99 mg/dL    Comment: Glucose reference range applies only to samples taken after fasting for at least 8 hours.  Glucose, capillary     Status: Abnormal   Collection Time: 10/30/21  6:53 AM  Result Value Ref Range   Glucose-Capillary 127 (H) 70 - 99 mg/dL    Comment: Glucose reference range applies only to samples taken after fasting for at least 8 hours.  Brain natriuretic  peptide     Status: Abnormal   Collection Time: 10/30/21  7:00 AM  Result Value Ref Range   B Natriuretic Peptide 617.6 (H) 0.0 - 100.0 pg/mL    Comment: Performed at [facility] [address], [place], [place] [address]  Heparin level (unfractionated)     Status: Abnormal   Collection Time: 10/30/21  7:00 AM  Result Value Ref Range   Heparin Unfractionated <0.10 (L) 0.30 - 0.70 IU/mL    Comment: (NOTE) The clinical reportable range upper limit is being lowered to >1.10 to align with the FDA approved guidance for the current laboratory assay.  If heparin results are below expected values, and patient dosage has  been confirmed, suggest follow up testing of antithrombin III levels. Performed at [facility], [place] [address] [place], [place] [address]   Culture, Respiratory w Gram Stain     Status: None (Preliminary result)   Collection Time: 10/30/21  7:46 AM   Specimen: Tracheal Aspirate; Respiratory  Result Value Ref Range   Specimen Description TRACHEAL ASPIRATE    Special Requests Normal    Gram Stain      MODERATE WBC PRESENT, PREDOMINANTLY PMN MODERATE YEAST RARE GRAM POSITIVE COCCI    Culture      CULTURE REINCUBATED FOR BETTER GROWTH Performed at [facility], [place] [address], [place], [place] [address]    Report Status PENDING   Glucose, capillary     Status: Abnormal   Collection Time: 10/30/21  7:59 AM  Result Value Ref Range   Glucose-Capillary 141 (H) 70 - 99 mg/dL    Comment: Glucose reference range applies only to samples taken after fasting for at least 8 hours.  I-STAT 7, (LYTES, BLD GAS, ICA, H+H)     Status: Abnormal   Collection Time: 10/30/21  8:03 AM  Result Value Ref Range   pH, Arterial 7.599 (H) 7.350 - 7.450   pCO2 arterial 28.1 (L) 32.0 - 48.0 mmHg   pO2, Arterial 103 83.0 - 108.0 mmHg   Bicarbonate 27.6 20.0 - 28.0 mmol/L   TCO2 28 22 - 32 mmol/L   O2 Saturation 99.0 %   Acid-Base Excess 6.0 (H) 0.0 - 2.0 mmol/L   Sodium  135 135 - 145 mmol/L   Potassium 4.9 3.5 - 5.1 mmol/L   Calcium, Ion 0.84 (LL) 1.15 - 1.40 mmol/L   HCT 26.0 (L) 36.0 - 46.0 %   Hemoglobin 8.8 (L) 12.0 - 15.0 g/dL   Patient temperature 36.5 C    Sample type ARTERIAL  Comment NOTIFIED PHYSICIAN   CBC with Differential/Platelet     Status: Abnormal   Collection Time: 10/30/21  9:38 AM  Result Value Ref Range   WBC 20.2 (H) 4.0 - 10.5 K/uL   RBC 3.44 (L) 3.87 - 5.11 MIL/uL   Hemoglobin 9.5 (L) 12.0 - 15.0 g/dL   HCT 27.0 (L) 36.0 - 46.0 %   MCV 78.5 (L) 80.0 - 100.0 fL   MCH 27.6 26.0 - 34.0 pg   MCHC 35.2 30.0 - 36.0 g/dL   RDW 14.7 11.5 - 15.5 %   Platelets 92 (L) 150 - 400 K/uL    Comment: Immature Platelet Fraction may be clinically indicated, consider ordering this additional test [id number] CONSISTENT WITH PREVIOUS RESULT REPEATED TO VERIFY    nRBC 0.0 0.0 - 0.2 %   Neutrophils Relative % 86 %   Neutro Abs 17.3 (H) 1.7 - 7.7 K/uL   Lymphocytes Relative 6 %   Lymphs Abs 1.1 0.7 - 4.0 K/uL   Monocytes Relative 2 %   Monocytes Absolute 0.5 0.1 - 1.0 K/uL   Eosinophils Relative 0 %   Eosinophils Absolute 0.0 0.0 - 0.5 K/uL   Basophils Relative 0 %   Basophils Absolute 0.1 0.0 - 0.1 K/uL   WBC Morphology MORPHOLOGY UNREMARKABLE    RBC Morphology MORPHOLOGY UNREMARKABLE    Smear Review PLATELETS APPEAR DECREASED    Immature Granulocytes 6 %   Abs Immature Granulocytes 1.21 (H) 0.00 - 0.07 K/uL    Comment: Performed at [facility], [address], [place], [place] [address]  Lactic acid, plasma     Status: Abnormal   Collection Time: 10/30/21  9:38 AM  Result Value Ref Range   Lactic Acid, Venous 4.0 (HH) 0.5 - 1.9 mmol/L    Comment: CRITICAL VALUE NOTED.  VALUE IS CONSISTENT WITH PREVIOUSLY REPORTED AND CALLED VALUE. Performed at [facility], [place] [address], [place], [place] [address]   Glucose, capillary     Status: Abnormal   Collection Time: 10/30/21  9:38 AM  Result Value Ref Range    Glucose-Capillary 135 (H) 70 - 99 mg/dL    Comment: Glucose reference range applies only to samples taken after fasting for at least 8 hours.  Culture, blood (routine x 2)     Status: None (Preliminary result)   Collection Time: 10/30/21 10:45 AM   Specimen: BLOOD RIGHT HAND  Result Value Ref Range   Specimen Description BLOOD RIGHT HAND    Special Requests      BOTTLES DRAWN AEROBIC AND ANAEROBIC Blood Culture adequate volume   Culture      NO GROWTH < 24 HOURS Performed at [facility], [place] [address] [place], [place] [address]    Report Status PENDING   Glucose, capillary     Status: Abnormal   Collection Time: 10/30/21 10:50 AM  Result Value Ref Range   Glucose-Capillary 131 (H) 70 - 99 mg/dL    Comment: Glucose reference range applies only to samples taken after fasting for at least 8 hours.  Vancomycin, random     Status: None   Collection Time: 10/30/21 10:57 AM  Result Value Ref Range   Vancomycin Rm 21     Comment:        Random Vancomycin therapeutic range is dependent on dosage and time of specimen collection. A peak range is 20.0-40.0 ug/mL A trough range is 5.0-15.0 ug/mL        Performed at [facility], [place] [address] [place], [place] [address]   Culture, blood (routine x 2)     Status: None (Preliminary result)   Collection Time: 10/30/21 11:11 AM   Specimen: BLOOD RIGHT FOREARM  Result Value Ref Range   Specimen Description BLOOD RIGHT FOREARM    Special Requests      BOTTLES DRAWN AEROBIC AND ANAEROBIC Blood Culture results may not be optimal due to an inadequate volume of blood received in culture bottles   Culture      NO GROWTH < 24 HOURS Performed at [facility], [place] [address] [place], [place] [address]    Report Status PENDING   Glucose, capillary     Status: Abnormal   Collection Time: 10/30/21 12:04 PM  Result Value Ref Range   Glucose-Capillary 133 (H) 70 - 99 mg/dL    Comment: Glucose reference range applies only to  samples taken after fasting for at least 8 hours.  Glucose, capillary     Status: Abnormal   Collection Time: 10/30/21  1:07 PM  Result Value Ref Range   Glucose-Capillary 134 (H) 70 - 99 mg/dL    Comment: Glucose reference range applies only to samples taken after fasting for at least 8 hours.  Glucose, capillary     Status: Abnormal   Collection Time: 10/30/21  1:50 PM  Result Value Ref Range   Glucose-Capillary 136 (H) 70 - 99 mg/dL    Comment: Glucose reference range applies only to samples taken after fasting for at least 8 hours.  I-STAT 7, (LYTES, BLD GAS, ICA, H+H)     Status: Abnormal   Collection Time: 10/30/21  1:56 PM  Result Value Ref Range   pH, Arterial 7.657 (HH) 7.350 - 7.450   pCO2 arterial 22.7 (L) 32.0 - 48.0 mmHg   pO2, Arterial 124 (H) 83.0 - 108.0 mmHg   Bicarbonate 25.4 20.0 - 28.0 mmol/L   TCO2 26 22 - 32 mmol/L   O2 Saturation 99.0 %   Acid-Base Excess 5.0 (H) 0.0 - 2.0 mmol/L   Sodium 133 (L) 135 - 145 mmol/L   Potassium 5.6 (H) 3.5 - 5.1 mmol/L   Calcium, Ion 0.85 (LL) 1.15 - 1.40 mmol/L   HCT 22.0 (L) 36.0 - 46.0 %   Hemoglobin 7.5 (L) 12.0 - 15.0 g/dL   Patient temperature 37.2 C    Sample type ARTERIAL    Comment NOTIFIED PHYSICIAN   CBC with Differential/Platelet     Status: Abnormal   Collection Time: 10/30/21  1:57 PM  Result Value Ref Range   WBC 21.2 (H) 4.0 - 10.5 K/uL   RBC 2.88 (L) 3.87 - 5.11 MIL/uL   Hemoglobin 8.3 (L) 12.0 - 15.0 g/dL    Comment: Reticulocyte Hemoglobin testing may be clinically indicated, consider ordering this additional test [id number]    HCT 22.4 (L) 36.0 - 46.0 %   MCV 77.8 (L) 80.0 - 100.0 fL   MCH 28.8 26.0 - 34.0 pg   MCHC 37.1 (H) 30.0 - 36.0 g/dL   RDW 14.6 11.5 - 15.5 %   Platelets 100 (L) 150 - 400 K/uL    Comment: Immature Platelet Fraction may be clinically indicated, consider ordering this additional test [id number] CONSISTENT WITH PREVIOUS RESULT REPEATED TO VERIFY    nRBC 0.0 0.0 - 0.2 %    Neutrophils Relative % 84 %   Neutro Abs 17.9 (H) 1.7 - 7.7 K/uL   Lymphocytes Relative 6 %   Lymphs Abs  1.2 0.7 - 4.0 K/uL   Monocytes Relative 2 %   Monocytes Absolute 0.5 0.1 - 1.0 K/uL   Eosinophils Relative 2 %   Eosinophils Absolute 0.4 0.0 - 0.5 K/uL   Basophils Relative 0 %   Basophils Absolute 0.0 0.0 - 0.1 K/uL   WBC Morphology MORPHOLOGY UNREMARKABLE    RBC Morphology BURR CELLS    Smear Review PLATELETS APPEAR DECREASED    Immature Granulocytes 6 %   Abs Immature Granulocytes 1.20 (H) 0.00 - 0.07 K/uL    Comment: Performed at [facility] [address], [place], [place] [address]  Magnesium     Status: None   Collection Time: 10/30/21  1:57 PM  Result Value Ref Range   Magnesium 1.9 1.7 - 2.4 mg/dL    Comment: Performed at [facility], [place] [address] [place], [place] [address]  Phosphorus     Status: None   Collection Time: 10/30/21  1:57 PM  Result Value Ref Range   Phosphorus 2.5 2.5 - 4.6 mg/dL    Comment: Performed at [facility] [address] [place], [place] [address]  Potassium     Status: Abnormal   Collection Time: 10/30/21  1:57 PM  Result Value Ref Range   Potassium 5.8 (H) 3.5 - 5.1 mmol/L    Comment: Performed at [facility] [address] [place], [place] [address]  Glucose, capillary     Status: Abnormal   Collection Time: 10/30/21  3:08 PM  Result Value Ref Range   Glucose-Capillary 144 (H) 70 - 99 mg/dL    Comment: Glucose reference range applies only to samples taken after fasting for at least 8 hours.  Lactic acid, plasma     Status: Abnormal   Collection Time: 10/30/21  3:12 PM  Result Value Ref Range   Lactic Acid, Venous 3.4 (HH) 0.5 - 1.9 mmol/L    Comment: CRITICAL VALUE NOTED.  VALUE IS CONSISTENT WITH PREVIOUSLY REPORTED AND CALLED VALUE. Performed at [facility], [place] [address] [place], [place] [address]   Glucose, capillary     Status: Abnormal   Collection Time: 10/30/21  4:18 PM  Result  Value Ref Range   Glucose-Capillary 162 (H) 70 - 99 mg/dL    Comment: Glucose reference range applies only to samples taken after fasting for at least 8 hours.  Glucose, capillary     Status: Abnormal   Collection Time: 10/30/21  5:18 PM  Result Value Ref Range   Glucose-Capillary 167 (H) 70 - 99 mg/dL    Comment: Glucose reference range applies only to samples taken after fasting for at least 8 hours.  DIC Panel 5A & 5P     Status: Abnormal   Collection Time: 10/30/21  5:21 PM  Result Value Ref Range   Prothrombin Time 22.2 (H) 11.4 - 15.2 seconds   INR 2.0 (H) 0.8 - 1.2    Comment: (NOTE) INR goal varies based on device and disease states.    aPTT >200 (HH) 24 - 36 seconds    Comment:        IF BASELINE aPTT IS ELEVATED, SUGGEST PATIENT RISK ASSESSMENT BE USED TO DETERMINE APPROPRIATE ANTICOAGULANT THERAPY. REPEATED TO VERIFY CRITICAL RESULT CALLED TO, READ BACK BY AND VERIFIED WITH: Tera Partridge 10/30/21 AT 1946 BY P.WHITE,MT.    Fibrinogen 339 210 - 475 mg/dL    Comment: (NOTE) Fibrinogen results may be underestimated in patients receiving thrombolytic therapy.  D-Dimer, Quant >20.00 (H) 0.00 - 0.50 ug/mL-FEU    Comment: REPEATED TO VERIFY (NOTE) At the manufacturer cut-off value of 0.5 g/mL FEU, this assay has a negative predictive value of 95-100%.This assay is intended for use in conjunction with a clinical pretest probability (PTP) assessment model to exclude pulmonary embolism (PE) and deep venous thrombosis (DVT) in outpatients suspected of PE or DVT. Results should be correlated with clinical presentation.    Platelets 101 (L) 150 - 400 K/uL    Comment: Immature Platelet Fraction may be clinically indicated, consider ordering this additional test [id number] CONSISTENT WITH PREVIOUS RESULT REPEATED TO VERIFY    Smear Review NO SCHISTOCYTES SEEN     Comment: Performed at [facility], [place] [address], [place], [place] [address]  Heparin level  (unfractionated)     Status: Abnormal   Collection Time: 10/30/21  5:21 PM  Result Value Ref Range   Heparin Unfractionated 0.74 (H) 0.30 - 0.70 IU/mL    Comment: (NOTE) The clinical reportable range upper limit is being lowered to >1.10 to align with the FDA approved guidance for the current laboratory assay.  If heparin results are below expected values, and patient dosage has  been confirmed, suggest follow up testing of antithrombin III levels. Performed at [facility], [place] [address] [place], [place] [address]   I-STAT 7, (LYTES, BLD GAS, ICA, H+H)     Status: Abnormal   Collection Time: 10/30/21  5:23 PM  Result Value Ref Range   pH, Arterial 7.391 7.350 - 7.450   pCO2 arterial 42.7 32.0 - 48.0 mmHg   pO2, Arterial 90 83.0 - 108.0 mmHg   Bicarbonate 25.8 20.0 - 28.0 mmol/L   TCO2 27 22 - 32 mmol/L   O2 Saturation 97.0 %   Acid-Base Excess 1.0 0.0 - 2.0 mmol/L   Sodium 132 (L) 135 - 145 mmol/L   Potassium 5.6 (H) 3.5 - 5.1 mmol/L   Calcium, Ion 0.88 (LL) 1.15 - 1.40 mmol/L   HCT 17.0 (L) 36.0 - 46.0 %   Hemoglobin 5.8 (LL) 12.0 - 15.0 g/dL   Patient temperature 37.3 C    Sample type ARTERIAL    Comment NOTIFIED PHYSICIAN   CBC     Status: Abnormal   Collection Time: 10/30/21  5:57 PM  Result Value Ref Range   WBC 24.6 (H) 4.0 - 10.5 K/uL   RBC 2.34 (L) 3.87 - 5.11 MIL/uL   Hemoglobin 6.9 (LL) 12.0 - 15.0 g/dL    Comment: REPEATED TO VERIFY THIS CRITICAL RESULT HAS VERIFIED AND BEEN CALLED TO M.BITONTI,RN BY JOHN VANG ON 01 03 2023 AT 1833, AND HAS BEEN READ BACK.     HCT 18.9 (L) 36.0 - 46.0 %   MCV 80.8 80.0 - 100.0 fL   MCH 29.5 26.0 - 34.0 pg   MCHC 36.5 (H) 30.0 - 36.0 g/dL   RDW 14.9 11.5 - 15.5 %   Platelets 101 (L) 150 - 400 K/uL    Comment: SPECIMEN CHECKED FOR CLOTS Immature Platelet Fraction may be clinically indicated, consider ordering this additional test [id number] CONSISTENT WITH PREVIOUS RESULT REPEATED TO VERIFY    nRBC 0.2 0.0 - 0.2  %    Comment: Performed at [facility], [place] [address], [place], [place] [address]  Prepare RBC (crossmatch)     Status: None   Collection Time: 10/30/21  6:04 PM  Result Value Ref Range   Order Confirmation  ORDER PROCESSED BY BLOOD BANK Performed at [facility], [place] [address], [place], [place] [address]   Prepare RBC (crossmatch)     Status: None   Collection Time: 10/30/21  6:23 PM  Result Value Ref Range   Order Confirmation      ORDER PROCESSED BY BLOOD BANK Performed at [facility], [place] [address], [place], [place] [address]   Prepare RBC (crossmatch)     Status: None   Collection Time: 10/30/21  7:01 PM  Result Value Ref Range   Order Confirmation      ORDER PROCESSED BY BLOOD BANK BB SAMPLE OR UNITS ALREADY AVAILABLE Performed at [facility], [place] [address], [place], [place] [address]   Glucose, capillary     Status: Abnormal   Collection Time: 10/30/21  7:28 PM  Result Value Ref Range   Glucose-Capillary 105 (H) 70 - 99 mg/dL    Comment: Glucose reference range applies only to samples taken after fasting for at least 8 hours.  Glucose, capillary     Status: Abnormal   Collection Time: 10/30/21  9:16 PM  Result Value Ref Range   Glucose-Capillary 147 (H) 70 - 99 mg/dL    Comment: Glucose reference range applies only to samples taken after fasting for at least 8 hours.   Comment 1 Document in Chart   Lactic acid, plasma     Status: Abnormal   Collection Time: 10/30/21  9:24 PM  Result Value Ref Range   Lactic Acid, Venous 7.4 (HH) 0.5 - 1.9 mmol/L    Comment: CRITICAL VALUE NOTED.  VALUE IS CONSISTENT WITH PREVIOUSLY REPORTED AND CALLED VALUE. Performed at [facility], [place] [address], [place], [place] [address]   Initiate MTP (Blood Bank Notification)     Status: None   Collection Time: 10/30/21  9:28 PM  Result Value Ref Range   Initiate Massive Transfusion Protocol      MTP ACTIVATED Performed at [facility],  [place] [address] [place], [place] [address]   DIC Panel now then every 30 minutes     Status: Abnormal   Collection Time: 10/30/21  9:41 PM  Result Value Ref Range   Prothrombin Time 21.0 (H) 11.4 - 15.2 seconds   INR 1.8 (H) 0.8 - 1.2    Comment: (NOTE) INR goal varies based on device and disease states.    aPTT 157 (H) 24 - 36 seconds    Comment:        IF BASELINE aPTT IS ELEVATED, SUGGEST PATIENT RISK ASSESSMENT BE USED TO DETERMINE APPROPRIATE ANTICOAGULANT THERAPY.    Fibrinogen 342 210 - 475 mg/dL    Comment: (NOTE) Fibrinogen results may be underestimated in patients receiving thrombolytic therapy.    D-Dimer, Quant >20.00 (H) 0.00 - 0.50 ug/mL-FEU    Comment: (NOTE) At the manufacturer cut-off value of 0.5 g/mL FEU, this assay has a negative predictive value of 95-100%.This assay is intended for use in conjunction with a clinical pretest probability (PTP) assessment model to exclude pulmonary embolism (PE) and deep venous thrombosis (DVT) in outpatients suspected of PE or DVT. Results should be correlated with clinical presentation.    Platelets 84 (L) 150 - 400 K/uL    Comment: Immature Platelet Fraction may be clinically indicated, consider ordering this additional test [id number] CONSISTENT WITH PREVIOUS RESULT REPEATED TO VERIFY    Smear Review NO SCHISTOCYTES SEEN     Comment: Performed at [facility], [address] [place], [place] [address]  Hemoglobin and hematocrit, blood (STAT)     Status: Abnormal   Collection Time: 10/30/21  9:41 PM  Result Value Ref Range   Hemoglobin 11.2 (L) 12.0 - 15.0 g/dL    Comment: REPEATED TO VERIFY POST TRANSFUSION SPECIMEN    HCT 32.5 (L) 36.0 - 46.0 %    Comment: Performed at [facility] [address] [place], [place] [address]  Glucose, capillary     Status: Abnormal   Collection Time: 10/30/21 10:26 PM  Result Value Ref Range   Glucose-Capillary 150 (H) 70 - 99 mg/dL    Comment: Glucose reference  range applies only to samples taken after fasting for at least 8 hours.  I-STAT 7, (LYTES, BLD GAS, ICA, H+H)     Status: Abnormal   Collection Time: 10/30/21 10:45 PM  Result Value Ref Range   pH, Arterial 7.226 (L) 7.350 - 7.450   pCO2 arterial 45.6 32.0 - 48.0 mmHg   pO2, Arterial 70 (L) 83.0 - 108.0 mmHg   Bicarbonate 18.9 (L) 20.0 - 28.0 mmol/L   TCO2 20 (L) 22 - 32 mmol/L   O2 Saturation 90.0 %   Acid-base deficit 8.0 (H) 0.0 - 2.0 mmol/L   Sodium 133 (L) 135 - 145 mmol/L   Potassium 5.7 (H) 3.5 - 5.1 mmol/L   Calcium, Ion 0.88 (LL) 1.15 - 1.40 mmol/L   HCT 34.0 (L) 36.0 - 46.0 %   Hemoglobin 11.6 (L) 12.0 - 15.0 g/dL   Patient temperature 36.9 C    Sample type ARTERIAL    Comment NOTIFIED PHYSICIAN   DIC Panel now then every 30 minutes     Status: Abnormal   Collection Time: 10/31/21 12:26 AM  Result Value Ref Range   Prothrombin Time 18.7 (H) 11.4 - 15.2 seconds   INR 1.6 (H) 0.8 - 1.2    Comment: (NOTE) INR goal varies based on device and disease states.    aPTT 76 (H) 24 - 36 seconds    Comment:        IF BASELINE aPTT IS ELEVATED, SUGGEST PATIENT RISK ASSESSMENT BE USED TO DETERMINE APPROPRIATE ANTICOAGULANT THERAPY.    Fibrinogen 290 210 - 475 mg/dL    Comment: (NOTE) Fibrinogen results may be underestimated in patients receiving thrombolytic therapy.    D-Dimer, Quant >20.00 (H) 0.00 - 0.50 ug/mL-FEU    Comment: (NOTE) At the manufacturer cut-off value of 0.5 g/mL FEU, this assay has a negative predictive value of 95-100%.This assay is intended for use in conjunction with a clinical pretest probability (PTP) assessment model to exclude pulmonary embolism (PE) and deep venous thrombosis (DVT) in outpatients suspected of PE or DVT. Results should be correlated with clinical presentation.    Platelets 60 (L) 150 - 400 K/uL    Comment: Immature Platelet Fraction may be clinically indicated, consider ordering this additional test [id number] REPEATED TO  VERIFY PLATELET COUNT CONFIRMED BY SMEAR    Smear Review NO SCHISTOCYTES SEEN     Comment: Performed at [facility] [address], [place], [place] [address]  Hemoglobin and hematocrit, blood (STAT)     Status: None   Collection Time: 10/31/21 12:26 AM  Result Value Ref Range   Hemoglobin 12.8 12.0 - 15.0 g/dL   HCT 37.1 36.0 - 46.0 %    Comment: Performed at [facility], [place] [address], [place], [place] [address]  Basic metabolic panel     Status:  Abnormal   Collection Time: 10/31/21 12:26 AM  Result Value Ref Range   Sodium 135 135 - 145 mmol/L   Potassium 5.8 (H) 3.5 - 5.1 mmol/L   Chloride 99 98 - 111 mmol/L   CO2 18 (L) 22 - 32 mmol/L   Glucose, Bld 144 (H) 70 - 99 mg/dL    Comment: Glucose reference range applies only to samples taken after fasting for at least 8 hours.   BUN 50 (H) 8 - 23 mg/dL   Creatinine, Ser 5.39 (H) 0.44 - 1.00 mg/dL   Calcium 6.6 (L) 8.9 - 10.3 mg/dL   GFR, Estimated 8 (L) >60 mL/min    Comment: (NOTE) Calculated using the CKD-EPI Creatinine Equation (2021)    Anion gap 18 (H) 5 - 15    Comment: Performed at [facility] [address] [place], [place] [address]  I-STAT 7, (LYTES, BLD GAS, ICA, H+H)     Status: Abnormal   Collection Time: 10/31/21 12:34 AM  Result Value Ref Range   pH, Arterial 7.375 7.350 - 7.450   pCO2 arterial 32.2 32.0 - 48.0 mmHg   pO2, Arterial 66 (L) 83.0 - 108.0 mmHg   Bicarbonate 18.9 (L) 20.0 - 28.0 mmol/L   TCO2 20 (L) 22 - 32 mmol/L   O2 Saturation 93.0 %   Acid-base deficit 6.0 (H) 0.0 - 2.0 mmol/L   Sodium 135 135 - 145 mmol/L   Potassium 5.7 (H) 3.5 - 5.1 mmol/L   Calcium, Ion 0.81 (LL) 1.15 - 1.40 mmol/L   HCT 35.0 (L) 36.0 - 46.0 %   Hemoglobin 11.9 (L) 12.0 - 15.0 g/dL   Patient temperature 36.8 C    Sample type ARTERIAL    Comment NOTIFIED PHYSICIAN   Glucose, capillary     Status: Abnormal   Collection Time: 10/31/21 12:36 AM  Result Value Ref Range   Glucose-Capillary 137 (H)  70 - 99 mg/dL    Comment: Glucose reference range applies only to samples taken after fasting for at least 8 hours.   Comment 1 Document in Chart   DIC Panel now then every 30 minutes     Status: Abnormal   Collection Time: 10/31/21  1:53 AM  Result Value Ref Range   Prothrombin Time 18.7 (H) 11.4 - 15.2 seconds   INR 1.6 (H) 0.8 - 1.2    Comment: (NOTE) INR goal varies based on device and disease states.    aPTT 71 (H) 24 - 36 seconds    Comment:        IF BASELINE aPTT IS ELEVATED, SUGGEST PATIENT RISK ASSESSMENT BE USED TO DETERMINE APPROPRIATE ANTICOAGULANT THERAPY.    Fibrinogen 298 210 - 475 mg/dL    Comment: (NOTE) Fibrinogen results may be underestimated in patients receiving thrombolytic therapy.    D-Dimer, Quant >20.00 (H) 0.00 - 0.50 ug/mL-FEU    Comment: (NOTE) At the manufacturer cut-off value of 0.5 g/mL FEU, this assay has a negative predictive value of 95-100%.This assay is intended for use in conjunction with a clinical pretest probability (PTP) assessment model to exclude pulmonary embolism (PE) and deep venous thrombosis (DVT) in outpatients suspected of PE or DVT. Results should be correlated with clinical presentation.    Platelets 60 (L) 150 - 400 K/uL    Comment: Immature Platelet Fraction may be clinically indicated, consider ordering this additional test [id number] REPEATED TO VERIFY PLATELET COUNT CONFIRMED BY SMEAR    Smear Review NO SCHISTOCYTES SEEN  Comment: Performed at [facility], [place] [address], [place], [place] [address]  Hemoglobin and hematocrit, blood (STAT)     Status: Abnormal   Collection Time: 10/31/21  1:53 AM  Result Value Ref Range   Hemoglobin 12.3 12.0 - 15.0 g/dL   HCT 35.3 (L) 36.0 - 46.0 %    Comment: Performed at [facility], [place] [address] [place], [place] [address]  Glucose, capillary     Status: Abnormal   Collection Time: 10/31/21  1:59 AM  Result Value Ref Range   Glucose-Capillary 144 (H) 70  - 99 mg/dL    Comment: Glucose reference range applies only to samples taken after fasting for at least 8 hours.   Comment 1 Document in Chart   Glucose, capillary     Status: Abnormal   Collection Time: 10/31/21  3:22 AM  Result Value Ref Range   Glucose-Capillary 130 (H) 70 - 99 mg/dL    Comment: Glucose reference range applies only to samples taken after fasting for at least 8 hours.   Comment 1 Document in Chart   Glucose, capillary     Status: Abnormal   Collection Time: 10/31/21  4:27 AM  Result Value Ref Range   Glucose-Capillary 118 (H) 70 - 99 mg/dL    Comment: Glucose reference range applies only to samples taken after fasting for at least 8 hours.   Comment 1 Document in Chart   Lactic acid, plasma     Status: Abnormal   Collection Time: 10/31/21  5:31 AM  Result Value Ref Range   Lactic Acid, Venous 3.9 (HH) 0.5 - 1.9 mmol/L    Comment: CRITICAL VALUE NOTED.  VALUE IS CONSISTENT WITH PREVIOUSLY REPORTED AND CALLED VALUE. Performed at [facility], [place] [address] [place], [place] [address]   Magnesium     Status: None   Collection Time: 10/31/21  5:31 AM  Result Value Ref Range   Magnesium 2.0 1.7 - 2.4 mg/dL    Comment: Performed at [facility] [address], [place], [place] [address]  Phosphorus     Status: Abnormal   Collection Time: 10/31/21  5:31 AM  Result Value Ref Range   Phosphorus 6.3 (H) 2.5 - 4.6 mg/dL    Comment: Performed at [facility] [address] [place], [place] [address]  Hepatic function panel     Status: Abnormal   Collection Time: 10/31/21  5:31 AM  Result Value Ref Range   Total Protein 3.7 (L) 6.5 - 8.1 g/dL   Albumin 1.7 (L) 3.5 - 5.0 g/dL   AST 711 (H) 15 - 41 U/L   ALT 713 (H) 0 - 44 U/L   Alkaline Phosphatase 91 38 - 126 U/L   Total Bilirubin 1.6 (H) 0.3 - 1.2 mg/dL   Bilirubin, Direct 0.6 (H) 0.0 - 0.2 mg/dL   Indirect Bilirubin 1.0 (H) 0.3 - 0.9 mg/dL    Comment: Performed at [facility] [address], [place], [place] [address]  Basic metabolic panel     Status: Abnormal   Collection Time: 10/31/21  5:31 AM  Result Value Ref Range   Sodium 135 135 - 145 mmol/L   Potassium 5.5 (H) 3.5 - 5.1 mmol/L   Chloride 98 98 - 111 mmol/L   CO2 21 (L) 22 - 32 mmol/L   Glucose, Bld 128 (H) 70 - 99 mg/dL    Comment: Glucose reference range applies only to  samples taken after fasting for at least 8 hours.   BUN 51 (H) 8 - 23 mg/dL   Creatinine, Ser 5.50 (H) 0.44 - 1.00 mg/dL   Calcium 7.4 (L) 8.9 - 10.3 mg/dL   GFR, Estimated 8 (L) >60 mL/min    Comment: (NOTE) Calculated using the CKD-EPI Creatinine Equation (2021)    Anion gap 16 (H) 5 - 15    Comment: Performed at [facility] [address] [place], [place] [address]  DIC Panel 5A & 5P     Status: Abnormal   Collection Time: 10/31/21  5:31 AM  Result Value Ref Range   Prothrombin Time 16.8 (H) 11.4 - 15.2 seconds   INR 1.4 (H) 0.8 - 1.2    Comment: (NOTE) INR goal varies based on device and disease states.    aPTT 44 (H) 24 - 36 seconds    Comment:        IF BASELINE aPTT IS ELEVATED, SUGGEST PATIENT RISK ASSESSMENT BE USED TO DETERMINE APPROPRIATE ANTICOAGULANT THERAPY.    Fibrinogen 319 210 - 475 mg/dL    Comment: (NOTE) Fibrinogen results may be underestimated in patients receiving thrombolytic therapy.    D-Dimer, Quant >20.00 (H) 0.00 - 0.50 ug/mL-FEU    Comment: (NOTE) At the manufacturer cut-off value of 0.5 g/mL FEU, this assay has a negative predictive value of 95-100%.This assay is intended for use in conjunction with a clinical pretest probability (PTP) assessment model to exclude pulmonary embolism (PE) and deep venous thrombosis (DVT) in outpatients suspected of PE or DVT. Results should be correlated with clinical presentation.    Platelets 73 (L) 150 - 400 K/uL    Comment: Immature Platelet Fraction may be clinically indicated, consider ordering this additional test [id number] CONSISTENT WITH  PREVIOUS RESULT REPEATED TO VERIFY    Smear Review NO SCHISTOCYTES SEEN     Comment: Performed at [facility], [place] [address] [place], [place] [address]  Fibrinogen     Status: None   Collection Time: 10/31/21  5:31 AM  Result Value Ref Range   Fibrinogen 318 210 - 475 mg/dL    Comment: (NOTE) Fibrinogen results may be underestimated in patients receiving thrombolytic therapy. Performed at [facility], [place] [address] [place], [place] [address]   Glucose, capillary     Status: Abnormal   Collection Time: 10/31/21  5:38 AM  Result Value Ref Range   Glucose-Capillary 137 (H) 70 - 99 mg/dL    Comment: Glucose reference range applies only to samples taken after fasting for at least 8 hours.   Comment 1 Document in Chart   Glucose, capillary     Status: Abnormal   Collection Time: 10/31/21  6:48 AM  Result Value Ref Range   Glucose-Capillary 132 (H) 70 - 99 mg/dL    Comment: Glucose reference range applies only to samples taken after fasting for at least 8 hours.   Comment 1 Document in Chart    CT ABDOMEN PELVIS WO CONTRAST  Result Date: 10/30/2021 CLINICAL DATA:  Retroperitoneal hemorrhage. EXAM: CT ABDOMEN AND PELVIS WITHOUT CONTRAST TECHNIQUE: Multidetector CT imaging of the abdomen and pelvis was performed following the standard protocol without IV contrast. COMPARISON:  October 28, 2021. FINDINGS: Lower chest: Large bilateral lower lobe airspace opacities are noted concerning for pneumonia with small adjacent pleural effusions. Hepatobiliary: Contrast is noted in the gallbladder. No biliary dilatation is noted. There appears to be hemorrhage within the left hepatic lobe which extends into  large adjacent left subscapular hematoma or perihepatic hematoma. High-density fluid is noted around the liver concerning for hemorrhage. Pancreas: Unremarkable. No pancreatic ductal dilatation or surrounding inflammatory changes. Spleen: Normal in size without focal abnormality.  Adrenals/Urinary Tract: Adrenal glands and kidneys are unremarkable. No hydronephrosis or renal obstruction is noted. Urinary bladder is decompressed secondary to Foley catheter. Stomach/Bowel: Nasogastric tube is seen looped within proximal stomach. The appendix appears normal. There is no evidence of bowel obstruction or inflammation. Vascular/Lymphatic: Aortic atherosclerosis. No enlarged abdominal or pelvic lymph nodes. Reproductive: Uterus and bilateral adnexa are unremarkable. Other: There is interval development of a large amount of high density fluid in the pelvis consistent with intraperitoneal hemorrhage. It is seen in bilateral pericolic gutters and noted around the spleen and pancreas. Musculoskeletal: No acute or significant osseous findings. IMPRESSION: There is interval development of large intraperitoneal hemorrhage seen in the pelvis which extends into both pericolic gutters and around the liver and spleen. Also noted is interval development intraparenchymal hemorrhage within the left hepatic lobe which extends into large left subcapsular hematoma. Large bilateral lower lobe airspace opacities are noted most consistent with pneumonia with small adjacent pleural effusions. Critical Value/emergent results were called by telephone at the time of interpretation on 10/30/2021 at 9:20 pm to provider Dr. Lucile Shutters, who verbally acknowledged these results. Aortic Atherosclerosis (ICD10-I70.0). Electronically Signed   By: Marijo Conception M.D.   On: 10/30/2021 21:21   IR THROMBECT PRIM MECH INIT (INCLU) MOD SED  Result Date: 10/29/2021 INDICATION: High-risk pulmonary embolism with hemodynamic instability requiring pressor support, bilateral pulmonary embolism with saddle component EXAM: 1. Ultrasound-guided puncture of the right common femoral vein 2. Catheterization of the main and right pulmonary arteries with bilateral pulmonary artery angiogram 3. Mechanical/aspiration thrombectomy of the right and left  pulmonary arteries COMPARISON:  CTA chest previous day MEDICATIONS: None. ANESTHESIA/SEDATION: Sedation provided by the critical care team. Refer to separate critical care records. FLUOROSCOPY TIME:  FLUOROSCOPY TIME Fluoroscopy Time: 12.3 minutes with 4 exposures COMPLICATIONS: None immediate. TECHNIQUE: Informed written consent was obtained from the patient after a thorough discussion of the procedural risks, benefits and alternatives. All questions were addressed. Maximal Sterile Barrier Technique was utilized including caps, mask, sterile gowns, sterile gloves, sterile drape, hand hygiene and skin antiseptic. A timeout was performed prior to the initiation of the procedure. The patient was placed supine on the exam table. Ultrasound of the right groin was performed. This demonstrated a widely patent right common femoral vein. A permanent image was stored in the electronic medical record. Using ultrasound guidance, the right common femoral vein was directly punctured with a 21 gauge micropuncture set with visualization of needle entry into the vessel lumen. Wire was advanced centrally into the IVC, followed by serial tract dilation to accommodate a short 6 [place] sheath. Initial attempts to catheterize the main pulmonary artery using combination of an Omni Flush catheter and Rosen guidewire were unsuccessful. The sheath was exchanged for a long 7 [place] sheath, and the pulmonary artery was catheterized using a combination of a single angled pigtail catheter and Rosen guidewire. Wire and catheter were directed into the right main pulmonary artery. Wire was exchanged for a short tapered stiff Amplatz wire. Over this wire, the access site was serially dilated to accommodate a 24 [place] sheath. Next, the Inari 24 [place] FlowTriever aspiration thrombectomy catheter was then advanced into the distal aspect of the right main pulmonary artery. Aspiration thrombectomy of the right main pulmonary artery was performed.  Large volume  of acute appearing clot was removed. The aspiration thrombectomy catheter was slowly withdrawn into the main pulmonary artery/RVOT, and thrombectomy of the saddle component and left pulmonary artery were performed, with removal of additional acute appearing thrombus. With the aspiration thrombectomy catheter positioned within the main pulmonary artery, bilateral pulmonary artery angiogram was performed. Bilateral pulmonary artery angiogram demonstrates complete resolution of the saddle pulmonary embolism in clot burden within the main pulmonary arteries. There remains scattered residual small volume subsegmental embolism. The patient's clinical parameters had improved with reduction in vasopressor requirement and improvement in blood pressure at the end of the procedure. Given this improvement and no further large volume embolism identified on completion angiography, the procedure was stopped at this point. All catheters and wires were removed. Hemostasis was achieved at the access site using a pursestring suture. A clean dressing was placed. The patient tolerated the procedure well without immediate complication. FINDINGS: As above. IMPRESSION: Successful mechanical/aspiration thrombectomy of high-risk bilateral pulmonary embolism using the Inari FlowTriever aspiration thrombectomy catheter. Completion angiography demonstrated near-complete resolution of the saddle component and clot burden within the main pulmonary arteries. Electronically Signed   By: Albin Felling M.D.   On: 10/29/2021 16:33   IR US Guide Vasc Access Right  Result Date: 10/29/2021 INDICATION: High-risk pulmonary embolism with hemodynamic instability requiring pressor support, bilateral pulmonary embolism with saddle component EXAM: 1. Ultrasound-guided puncture of the right common femoral vein 2. Catheterization of the main and right pulmonary arteries with bilateral pulmonary artery angiogram 3. Mechanical/aspiration  thrombectomy of the right and left pulmonary arteries COMPARISON:  CTA chest previous day MEDICATIONS: None. ANESTHESIA/SEDATION: Sedation provided by the critical care team. Refer to separate critical care records. FLUOROSCOPY TIME:  FLUOROSCOPY TIME Fluoroscopy Time: 12.3 minutes with 4 exposures COMPLICATIONS: None immediate. TECHNIQUE: Informed written consent was obtained from the patient after a thorough discussion of the procedural risks, benefits and alternatives. All questions were addressed. Maximal Sterile Barrier Technique was utilized including caps, mask, sterile gowns, sterile gloves, sterile drape, hand hygiene and skin antiseptic. A timeout was performed prior to the initiation of the procedure. The patient was placed supine on the exam table. Ultrasound of the right groin was performed. This demonstrated a widely patent right common femoral vein. A permanent image was stored in the electronic medical record. Using ultrasound guidance, the right common femoral vein was directly punctured with a 21 gauge micropuncture set with visualization of needle entry into the vessel lumen. Wire was advanced centrally into the IVC, followed by serial tract dilation to accommodate a short 6 [place] sheath. Initial attempts to catheterize the main pulmonary artery using combination of an Omni Flush catheter and Rosen guidewire were unsuccessful. The sheath was exchanged for a long 7 [place] sheath, and the pulmonary artery was catheterized using a combination of a single angled pigtail catheter and Rosen guidewire. Wire and catheter were directed into the right main pulmonary artery. Wire was exchanged for a short tapered stiff Amplatz wire. Over this wire, the access site was serially dilated to accommodate a 24 [place] sheath. Next, the Inari 24 [place] FlowTriever aspiration thrombectomy catheter was then advanced into the distal aspect of the right main pulmonary artery. Aspiration thrombectomy of the right main  pulmonary artery was performed. Large volume of acute appearing clot was removed. The aspiration thrombectomy catheter was slowly withdrawn into the main pulmonary artery/RVOT, and thrombectomy of the saddle component and left pulmonary artery were performed, with removal of additional acute appearing thrombus. With the aspiration thrombectomy  catheter positioned within the main pulmonary artery, bilateral pulmonary artery angiogram was performed. Bilateral pulmonary artery angiogram demonstrates complete resolution of the saddle pulmonary embolism in clot burden within the main pulmonary arteries. There remains scattered residual small volume subsegmental embolism. The patient's clinical parameters had improved with reduction in vasopressor requirement and improvement in blood pressure at the end of the procedure. Given this improvement and no further large volume embolism identified on completion angiography, the procedure was stopped at this point. All catheters and wires were removed. Hemostasis was achieved at the access site using a pursestring suture. A clean dressing was placed. The patient tolerated the procedure well without immediate complication. FINDINGS: As above. IMPRESSION: Successful mechanical/aspiration thrombectomy of high-risk bilateral pulmonary embolism using the Inari FlowTriever aspiration thrombectomy catheter. Completion angiography demonstrated near-complete resolution of the saddle component and clot burden within the main pulmonary arteries. Electronically Signed   By: Albin Felling M.D.   On: 10/29/2021 16:33    PMH:   Past Medical History:  Diagnosis Date   Anxiety    Arthritis    Asthma    Depression    Diabetes mellitus without complication (HCC)    Fibromyalgia    GERD (gastroesophageal reflux disease)    Hyperlipemia    Hypertension    PONV (postoperative nausea and vomiting)     PSH:   Past Surgical History:  Procedure Laterality Date   COLONOSCOPY      DILATION AND CURETTAGE OF UTERUS     IR THROMBECT PRIM MECH INIT (INCLU) MOD SED  10/29/2021   IR US GUIDE VASC ACCESS RIGHT  10/29/2021   KNEE ARTHROSCOPY  2003,2005   left   SHOULDER ARTHROSCOPY     left   TRIGGER FINGER RELEASE  08/13/2012   Procedure: RELEASE TRIGGER FINGER/A-1 PULLEY;  Surgeon: [name]., MD;  Location: [facility];  Service: Orthopedics;  Laterality: Right;  long finger   TUBAL LIGATION      Allergies:  Allergies  Allergen Reactions   Aspirin Nausea And Vomiting    Other reaction(s): Unknown   Benadryl [Diphenhydramine]     Can only take dye free. States the dye causes itching.   Darvon [Propoxyphene] Itching    Can only during the day. Night time makes her itch   Hydrocodone Bit-H[place]rop Mbr Itching   Metformin     Other reaction(s): GI   Other     Other reaction(s): Unknown. States reaction was to nasal spray that caused pupils to shrink   Pentazocine     nervous Other reaction(s): jitters   Percocet [Oxycodone-Acetaminophen]     itching   Sulfa Antibiotics Hives   Tetracaine Hcl     Other reaction(s): Unknown. Thinks caused itching.   Tetracyclines & Related Hives   Tramadol     Other reaction(s): itch    Medications:   Prior to Admission medications   Medication Sig Start Date End Date Taking? Authorizing Provider  acetaminophen (TYLENOL) 500 MG tablet Take 500-1,000 mg by mouth every 6 (six) hours as needed for mild pain or headache.    [provider]  albuterol (ACCUNEB) 1.25 MG/3ML nebulizer solution Take 1.25 mg by nebulization every 8 (eight) hours as needed for wheezing or shortness of breath. 07/04/21   [provider]  albuterol (PROVENTIL HFA;VENTOLIN HFA) 108 (90 BASE) MCG/ACT inhaler Inhale 1-2 puffs into the lungs every 4 (four) hours as needed for wheezing or shortness of breath.     [provider]  ALPRAZolam (XANAX) 0.25 MG tablet Take 0.25 mg by mouth daily as needed for anxiety.  02/19/19   [provider]  baclofen (LIORESAL) 10 MG tablet TAKE 1 TABLET(10 MG) BY MOUTH TWICE DAILY AS NEEDED FOR MUSCLE SPASMS Patient taking differently: Take 10 mg by mouth 2 (two) times daily as needed for muscle spasms. 06/15/21   Bo Merino, MD  betamethasone dipropionate (DIPROLENE) 0.05 % ointment 1 application to affected area 03/15/15   [provider]  diclofenac Sodium (VOLTAREN) 1 % GEL Apply 4 g topically daily as needed (pain).    [provider]  diphenhydrAMINE HCl (BENADRYL ALLERGY PO)     [provider]  Elastic Bandages & Supports (MEDICAL COMPRESSION STOCKINGS) [place] 1 each by Does not apply route every 6 (six) hours as needed (swelling). 04/28/19   Wieters, Hallie C, PA-C  fluticasone (FLONASE) 50 MCG/ACT nasal spray Place 2 sprays into both nostrils daily. 08/07/10   [provider]  gabapentin (NEURONTIN) 300 MG capsule Take 300 mg by mouth 2 (two) times daily.    [provider]  hydrochlorothiazide (HYDRODIURIL) 12.5 MG tablet Take 12.5 mg by mouth daily. 02/10/19   [provider]  LANTUS SOLOSTAR 100 UNIT/ML Solostar Pen Inject 22 Units into the skin at bedtime. 12/09/18   [provider]  pantoprazole (PROTONIX) 40 MG tablet Take 40 mg by mouth 2 (two) times daily.    [provider]  repaglinide (PRANDIN) 2 MG tablet Take 2 mg by mouth 2 (two) times daily before a meal.    [provider]  simvastatin (ZOCOR) 40 MG tablet Take 40 mg by mouth daily.    [provider]  temazepam (RESTORIL) 15 MG capsule TAKE 1 CAPSULE(15 MG) BY MOUTH AT BEDTIME AS NEEDED Patient taking differently: Take 15 mg by mouth at bedtime as needed for sleep. 10/15/21   Ofilia Neas, PA-C  Vitamin D, Ergocalciferol, (DRISDOL) 1.25 MG (50000 UNIT) CAPS capsule Take 50,000 Units by mouth once a week. 10/08/21   [provider]    Discontinued Meds:   Medications Discontinued During  This Encounter  Medication Reason   pantoprazole (PROTONIX) injection 40 mg    fentaNYL 2567mg in NS 2557m(1010mml) infusion-PREMIX    acetaminophen (TYLENOL) tablet 650 mg    acetaminophen (TYLENOL) 160 MG/5ML solution 650 mg    acetaminophen (TYLENOL) suppository 650 mg    magnesium sulfate IVPB 2 g 50 mL    norepinephrine (LEVOPHED) 4mg67m 250mL30m016 mg/mL) premix infusion    polyethylene glycol (MIRALAX / GLYCOLAX) packet 17 g    pantoprazole (PROTONIX) injection 40 mg    vancomycin (VANCOREADY) IVPB 750 mg/150 mL    vancomycin (VANCOREADY) IVPB 1500 mg/300 mL    insulin aspart (novoLOG) injection 0-6 Units    ceFEPIme (MAXIPIME) 2 g in sodium chloride 0.9 % 100 mL IVPB    ceFEPIme (MAXIPIME) 2 g in sodium chloride 0.9 % 100 mL IVPB    OneTouch Delica Lancets 33G M01T Patient Preference   Insulin Pen Needle (BD PEN NEEDLE NANO U/F) 32G X 4 MM MISC Patient Preference   glucose blood test strip Patient Preference   Blood Glucose Monitoring Suppl (TRUE METRIX METER) w/Device KIT Patient Preference   Blood Glucose Calibration (TRUE METRIX LEVEL 1) Low SOLN Patient Preference   BD PEN NEEDLE NANO 2ND GEN 32G X 4 MM MISC Patient Preference   Alcohol Swabs (B-D SINGLE USE SWABS REGULAR) PADS Patient Preference   Cholecalciferol (VITAMIN  D) 64 MCG (2000 UT) CAPS Change in therapy   diclofenac Sodium (VOLTAREN) 1 % GEL No longer needed (for PRN medications)   Molnupiravir 200 MG CAPS Completed Course   predniSONE (DELTASONE) 20 MG tablet Completed Course   lactated ringers infusion    norepinephrine (LEVOPHED) 73m in 2596m(0.016 mg/mL) premix infusion    sodium bicarbonate 150 mEq in sterile water 1,150 mL infusion    cephALEXin (KEFLEX) 500 MG capsule Completed Course   heparin ADULT infusion 100 units/mL (25000 units/25043m   0.9 %  sodium chloride infusion (Manually program via Guardrails IV Fluids)    0.9 %  sodium chloride infusion (Manually program via Guardrails IV Fluids)     lactated ringers infusion    0.9 %  sodium chloride infusion (Manually program via Guardrails IV Fluids)    0.9 %  sodium chloride infusion (Manually program via Guardrails IV Fluids)     Social History:  reports that she quit smoking about 11 years ago. Her smoking use included cigarettes. She has a 10.00 pack-year smoking history. She has never used smokeless tobacco. She reports that she does not drink alcohol and does not use drugs.  Family History:   Family History  Problem Relation Age of Onset   Diabetes Mother    Heart attack Father    Diabetes Sister    Throat cancer Brother    Hypertension Daughter     Blood pressure (!) 139/57, pulse 94, temperature 99 F (37.2 C), temperature source Bladder, resp. rate (!) 22, height 5' 5" (1.651 m), weight 93.6 kg, SpO2 100 %. General appearance: slowed mentation Head: Normocephalic, without obvious abnormality, atraumatic Eyes: negative Neck: no adenopathy, no carotid bruit, supple, symmetrical, trachea midline, and thyroid not enlarged, symmetric, no tenderness/mass/nodules Back: symmetric, no curvature. ROM normal. No CVA tenderness. Resp: rhonchi bilaterally Chest wall: no tenderness Cardio: regular rate and rhythm GI: decr BS Extremities: edema trace Pulses: 1+       , JAMHunt OrisD 10/31/2021, 8:51 AM

## 2021-10-31 NOTE — Progress Notes (Signed)
eLink Physician-Brief Progress Note Patient Name: [name] DOB: 1951-08-08 MRN: [id number]   Date of Service  10/31/2021  HPI/Events of Note  Patient is coagulopathic, consistent with DIC, and has ongoing bleeding.  eICU Interventions  2 units FFP + one platelet pack ordered transfused.                                                                                                                                                                     Kerry Kass Kaileigh Viswanathan 10/31/2021, 2:32 AM

## 2021-10-31 NOTE — Progress Notes (Signed)
eLink Physician-Brief Progress Note Patient Name: [name] DOB: 01-27-1951 MRN: [id number]   Date of Service  10/31/2021  HPI/Events of Note  DIC panel results reviewed, overall improvement, Platelets 73 K, Fibrinogen 318, PTT down to 44.  eICU Interventions  No intervention.        Kerry Kass Shafin Pollio 10/31/2021, 6:28 AM

## 2021-10-31 NOTE — Procedures (Addendum)
Patient Name: [name]  MRN: [id number]  Epilepsy Attending: Lora Havens  Referring Physician/Provider: Donita Brooks, NP Duration: 10/30/2021 1920 to 10/31/2021 1920   Patient history: 71yo F s/p cardiac arrest. EEG to evaluate for seizure   Level of alertness: lethargic   AEDs during EEG study: Propofol   Technical aspects: This EEG study was done with scalp electrodes positioned according to the 10-20 International system of electrode placement. Electrical activity was acquired at a sampling rate of 500Hz  and reviewed with a high frequency filter of 70Hz  and a low frequency filter of 1Hz . EEG data were recorded continuously and digitally stored.    Description: EEG showed continuous generalized low amplitude 3 to 6 Hz theta-delta slowing.  Intermittent frontocentral 15 to 18 Hz beta activity was also noted.  Hyperventilation and photic stimulation were not performed.     Patient event button was pressed on 10/31/2021 at 0719 for rhythmic eye opening.  Concomitant EEG before, during and after the event did not show any EEG to suggest seizure.    ABNORMALITY - Continuous slow, generalized   IMPRESSION: This study is suggestive of severe diffuse encephalopathy, nonspecific etiology. No seizures or epileptiform discharges were seen throughout the recording.  Patient event button was pressed on 10/31/2021 at 0719 for rhythmic eye opening without concomitant EEG change.  This was most likely not an epileptic event.   Tanith Dagostino Barbra Sarks

## 2021-10-31 NOTE — Progress Notes (Signed)
eLink Physician-Brief Progress Note Patient Name: [name] DOB: 1951-04-09 MRN: [id number]   Date of Service  10/31/2021  HPI/Events of Note  Nursing concerned about increasing vasopressor requirement. Hgb = 8.5.  eICU Interventions  Plan: ABG and iCa++ STAT. DIC Panel STAT.         Roylene Heaton Cornelia Copa 10/31/2021, 9:47 PM

## 2021-10-31 NOTE — Progress Notes (Signed)
eLink Physician-Brief Progress Note Patient Name: [name] DOB: 10/29/1950 MRN: [id number]   Date of Service  10/31/2021  HPI/Events of Note  ABG reviewed and acceptable. Ionized calcium 0.8 after 2 gm of Calcium gluconate.  eICU Interventions  Calcium gluconate 4 gm iv x 1 ordered.        Andrzej Scully U Malvin Morrish 10/31/2021, 1:09 AM

## 2021-10-31 NOTE — Progress Notes (Signed)
eLink Physician-Brief Progress Note Patient Name: LARUTH HANGER DOB: Feb 22, 1951 MRN: [id number]   Date of Service  10/31/2021  HPI/Events of Note  Multiple issues: 1. ABG on 40%/PRVC 22/TV 500/P 5 = 7.42/32.2/57/21.3, 2. Anemia- Hgb from ABG = 6.8, 3. Ionized Ca++  from ABG = 0.93  and 4. INR = 1.3, PTT = 38 and Platelets = 62.  eICU Interventions  Plan: H/H STAT. Replace Ca++.  Transfuse 2 units FFP. Transfuse 2 units single donor platelets. Repeat DIC panel at 5 AM.     Intervention Category Major Interventions: Respiratory failure - evaluation and management  Zabella Wease Eugene 10/31/2021, 11:04 PM

## 2021-10-31 NOTE — Progress Notes (Signed)
3 Days Post-Op  Subjective: On vent.  Epi dose decreased some from overnight  ROS: unable due to ventilated sedated status  Objective: Vital signs in last 24 hours: Temp:  [97.7 F (36.5 C)-99.1 F (37.3 C)] 99.1 F (37.3 C) (01/04 0900) Pulse Rate:  [83-119] 91 (01/04 0900) Resp:  [0-30] 22 (01/04 0900) BP: (74-160)/(40-117) 154/64 (01/04 0900) SpO2:  [84 %-100 %] 100 % (01/04 0900) Arterial Line BP: (64-180)/(35-103) 141/56 (01/04 0900) FiO2 (%):  [35 %-40 %] 40 % (01/04 0753) Weight:  [93.6 kg] 93.6 kg (01/04 0500) Last BM Date: 10/30/21  Intake/Output from previous day: 01/03 0701 - 01/04 0700 In: 6257.7 [I.V.:2156.7; Blood:1725; NG/GT:200.7; IV Piggyback:723.3] Out: 50 [Urine:50] Intake/Output this shift: Total I/O In: 577.3 [I.V.:361.6; NG/GT:115.7; IV Piggyback:100] Out: 400 [Emesis/NG output:400]  PE: Abd: soft, fullness and more firm in RUQ, hypoactive BS, NGT in place with some blood tinged output  Lab Results:  Recent Labs    10/30/21 1357 10/30/21 1721 10/30/21 1757 10/30/21 2141 10/31/21 0034 10/31/21 0153 10/31/21 0531  WBC 21.2*  --  24.6*  --   --   --   --   HGB 8.3*   < > 6.9*   < > 11.9* 12.3  --   HCT 22.4*   < > 18.9*   < > 35.0* 35.3*  --   PLT 100*   < > 101*   < >  --  60* 73*   < > = values in this interval not displayed.   BMET Recent Labs    10/31/21 0026 10/31/21 0034 10/31/21 0531  NA 135 135 135  K 5.8* 5.7* 5.5*  CL 99  --  98  CO2 18*  --  21*  GLUCOSE 144*  --  128*  BUN 50*  --  51*  CREATININE 5.39*  --  5.50*  CALCIUM 6.6*  --  7.4*   PT/INR Recent Labs    10/31/21 0153 10/31/21 0531  LABPROT 18.7* 16.8*  INR 1.6* 1.4*   CMP     Component Value Date/Time   NA 135 10/31/2021 0531   K 5.5 (H) 10/31/2021 0531   CL 98 10/31/2021 0531   CO2 21 (L) 10/31/2021 0531   GLUCOSE 128 (H) 10/31/2021 0531   BUN 51 (H) 10/31/2021 0531   CREATININE 5.50 (H) 10/31/2021 0531   CREATININE 1.14 (H) 03/06/2017 1502    CALCIUM 7.4 (L) 10/31/2021 0531   PROT 3.7 (L) 10/31/2021 0531   ALBUMIN 1.7 (L) 10/31/2021 0531   AST 711 (H) 10/31/2021 0531   ALT 713 (H) 10/31/2021 0531   ALKPHOS 91 10/31/2021 0531   BILITOT 1.6 (H) 10/31/2021 0531   GFRNONAA 8 (L) 10/31/2021 0531   GFRNONAA 50 (L) 03/06/2017 1502   GFRAA 51 (L) 04/11/2019 1121   GFRAA 57 (L) 03/06/2017 1502   Lipase  No results found for: LIPASE     Studies/Results: CT ABDOMEN PELVIS WO CONTRAST  Result Date: 10/30/2021 CLINICAL DATA:  Retroperitoneal hemorrhage. EXAM: CT ABDOMEN AND PELVIS WITHOUT CONTRAST TECHNIQUE: Multidetector CT imaging of the abdomen and pelvis was performed following the standard protocol without IV contrast. COMPARISON:  October 28, 2021. FINDINGS: Lower chest: Large bilateral lower lobe airspace opacities are noted concerning for pneumonia with small adjacent pleural effusions. Hepatobiliary: Contrast is noted in the gallbladder. No biliary dilatation is noted. There appears to be hemorrhage within the left hepatic lobe which extends into large adjacent left subscapular hematoma or perihepatic hematoma. High-density  fluid is noted around the liver concerning for hemorrhage. Pancreas: Unremarkable. No pancreatic ductal dilatation or surrounding inflammatory changes. Spleen: Normal in size without focal abnormality. Adrenals/Urinary Tract: Adrenal glands and kidneys are unremarkable. No hydronephrosis or renal obstruction is noted. Urinary bladder is decompressed secondary to Foley catheter. Stomach/Bowel: Nasogastric tube is seen looped within proximal stomach. The appendix appears normal. There is no evidence of bowel obstruction or inflammation. Vascular/Lymphatic: Aortic atherosclerosis. No enlarged abdominal or pelvic lymph nodes. Reproductive: Uterus and bilateral adnexa are unremarkable. Other: There is interval development of a large amount of high density fluid in the pelvis consistent with intraperitoneal hemorrhage. It  is seen in bilateral pericolic gutters and noted around the spleen and pancreas. Musculoskeletal: No acute or significant osseous findings. IMPRESSION: There is interval development of large intraperitoneal hemorrhage seen in the pelvis which extends into both pericolic gutters and around the liver and spleen. Also noted is interval development intraparenchymal hemorrhage within the left hepatic lobe which extends into large left subcapsular hematoma. Large bilateral lower lobe airspace opacities are noted most consistent with pneumonia with small adjacent pleural effusions. Critical Value/emergent results were called by telephone at the time of interpretation on 10/30/2021 at 9:20 pm to provider Dr. Lucile Shutters, who verbally acknowledged these results. Aortic Atherosclerosis (ICD10-I70.0). Electronically Signed   By: Marijo Conception M.D.   On: 10/30/2021 21:21   IR THROMBECT PRIM MECH INIT (INCLU) MOD SED  Result Date: 10/29/2021 INDICATION: High-risk pulmonary embolism with hemodynamic instability requiring pressor support, bilateral pulmonary embolism with saddle component EXAM: 1. Ultrasound-guided puncture of the right common femoral vein 2. Catheterization of the main and right pulmonary arteries with bilateral pulmonary artery angiogram 3. Mechanical/aspiration thrombectomy of the right and left pulmonary arteries COMPARISON:  CTA chest previous day MEDICATIONS: None. ANESTHESIA/SEDATION: Sedation provided by the critical care team. Refer to separate critical care records. FLUOROSCOPY TIME:  FLUOROSCOPY TIME Fluoroscopy Time: 12.3 minutes with 4 exposures COMPLICATIONS: None immediate. TECHNIQUE: Informed written consent was obtained from the patient after a thorough discussion of the procedural risks, benefits and alternatives. All questions were addressed. Maximal Sterile Barrier Technique was utilized including caps, mask, sterile gowns, sterile gloves, sterile drape, hand hygiene and skin antiseptic. A timeout  was performed prior to the initiation of the procedure. The patient was placed supine on the exam table. Ultrasound of the right groin was performed. This demonstrated a widely patent right common femoral vein. A permanent image was stored in the electronic medical record. Using ultrasound guidance, the right common femoral vein was directly punctured with a 21 gauge micropuncture set with visualization of needle entry into the vessel lumen. Wire was advanced centrally into the IVC, followed by serial tract dilation to accommodate a short 6 [place] sheath. Initial attempts to catheterize the main pulmonary artery using combination of an Omni Flush catheter and Rosen guidewire were unsuccessful. The sheath was exchanged for a long 7 [place] sheath, and the pulmonary artery was catheterized using a combination of a single angled pigtail catheter and Rosen guidewire. Wire and catheter were directed into the right main pulmonary artery. Wire was exchanged for a short tapered stiff Amplatz wire. Over this wire, the access site was serially dilated to accommodate a 24 [place] sheath. Next, the Inari 24 [place] FlowTriever aspiration thrombectomy catheter was then advanced into the distal aspect of the right main pulmonary artery. Aspiration thrombectomy of the right main pulmonary artery was performed. Large volume of acute appearing clot was removed. The aspiration thrombectomy catheter  was slowly withdrawn into the main pulmonary artery/RVOT, and thrombectomy of the saddle component and left pulmonary artery were performed, with removal of additional acute appearing thrombus. With the aspiration thrombectomy catheter positioned within the main pulmonary artery, bilateral pulmonary artery angiogram was performed. Bilateral pulmonary artery angiogram demonstrates complete resolution of the saddle pulmonary embolism in clot burden within the main pulmonary arteries. There remains scattered residual small volume subsegmental  embolism. The patient's clinical parameters had improved with reduction in vasopressor requirement and improvement in blood pressure at the end of the procedure. Given this improvement and no further large volume embolism identified on completion angiography, the procedure was stopped at this point. All catheters and wires were removed. Hemostasis was achieved at the access site using a pursestring suture. A clean dressing was placed. The patient tolerated the procedure well without immediate complication. FINDINGS: As above. IMPRESSION: Successful mechanical/aspiration thrombectomy of high-risk bilateral pulmonary embolism using the Inari FlowTriever aspiration thrombectomy catheter. Completion angiography demonstrated near-complete resolution of the saddle component and clot burden within the main pulmonary arteries. Electronically Signed   By: Albin Felling M.D.   On: 10/29/2021 16:33   IR US Guide Vasc Access Right  Result Date: 10/29/2021 INDICATION: High-risk pulmonary embolism with hemodynamic instability requiring pressor support, bilateral pulmonary embolism with saddle component EXAM: 1. Ultrasound-guided puncture of the right common femoral vein 2. Catheterization of the main and right pulmonary arteries with bilateral pulmonary artery angiogram 3. Mechanical/aspiration thrombectomy of the right and left pulmonary arteries COMPARISON:  CTA chest previous day MEDICATIONS: None. ANESTHESIA/SEDATION: Sedation provided by the critical care team. Refer to separate critical care records. FLUOROSCOPY TIME:  FLUOROSCOPY TIME Fluoroscopy Time: 12.3 minutes with 4 exposures COMPLICATIONS: None immediate. TECHNIQUE: Informed written consent was obtained from the patient after a thorough discussion of the procedural risks, benefits and alternatives. All questions were addressed. Maximal Sterile Barrier Technique was utilized including caps, mask, sterile gowns, sterile gloves, sterile drape, hand hygiene and skin  antiseptic. A timeout was performed prior to the initiation of the procedure. The patient was placed supine on the exam table. Ultrasound of the right groin was performed. This demonstrated a widely patent right common femoral vein. A permanent image was stored in the electronic medical record. Using ultrasound guidance, the right common femoral vein was directly punctured with a 21 gauge micropuncture set with visualization of needle entry into the vessel lumen. Wire was advanced centrally into the IVC, followed by serial tract dilation to accommodate a short 6 [place] sheath. Initial attempts to catheterize the main pulmonary artery using combination of an Omni Flush catheter and Rosen guidewire were unsuccessful. The sheath was exchanged for a long 7 [place] sheath, and the pulmonary artery was catheterized using a combination of a single angled pigtail catheter and Rosen guidewire. Wire and catheter were directed into the right main pulmonary artery. Wire was exchanged for a short tapered stiff Amplatz wire. Over this wire, the access site was serially dilated to accommodate a 24 [place] sheath. Next, the Inari 24 [place] FlowTriever aspiration thrombectomy catheter was then advanced into the distal aspect of the right main pulmonary artery. Aspiration thrombectomy of the right main pulmonary artery was performed. Large volume of acute appearing clot was removed. The aspiration thrombectomy catheter was slowly withdrawn into the main pulmonary artery/RVOT, and thrombectomy of the saddle component and left pulmonary artery were performed, with removal of additional acute appearing thrombus. With the aspiration thrombectomy catheter positioned within the main pulmonary artery, bilateral pulmonary artery  angiogram was performed. Bilateral pulmonary artery angiogram demonstrates complete resolution of the saddle pulmonary embolism in clot burden within the main pulmonary arteries. There remains scattered residual small  volume subsegmental embolism. The patient's clinical parameters had improved with reduction in vasopressor requirement and improvement in blood pressure at the end of the procedure. Given this improvement and no further large volume embolism identified on completion angiography, the procedure was stopped at this point. All catheters and wires were removed. Hemostasis was achieved at the access site using a pursestring suture. A clean dressing was placed. The patient tolerated the procedure well without immediate complication. FINDINGS: As above. IMPRESSION: Successful mechanical/aspiration thrombectomy of high-risk bilateral pulmonary embolism using the Inari FlowTriever aspiration thrombectomy catheter. Completion angiography demonstrated near-complete resolution of the saddle component and clot burden within the main pulmonary arteries. Electronically Signed   By: Albin Felling M.D.   On: 10/29/2021 16:33    Anti-infectives: Anti-infectives (From admission, onward)    Start     Dose/Rate Route Frequency Ordered Stop   10/31/21 1000  metroNIDAZOLE (FLAGYL) IVPB 500 mg        500 mg 100 mL/hr over 60 Minutes Intravenous Every 12 hours 10/31/21 0830     10/30/21 1800  vancomycin (VANCOREADY) IVPB 750 mg/150 mL        750 mg 150 mL/hr over 60 Minutes Intravenous  Once 10/30/21 1509 10/30/21 1855   10/30/21 0600  ceFEPIme (MAXIPIME) 2 g in sodium chloride 0.9 % 100 mL IVPB        2 g 200 mL/hr over 30 Minutes Intravenous Every 24 hours 10/29/21 0753     10/29/21 1100  vancomycin (VANCOREADY) IVPB 750 mg/150 mL  Status:  Discontinued        750 mg 150 mL/hr over 60 Minutes Intravenous Every 12 hours 10/28/21 2216 10/28/21 2217   10/29/21 0500  ceFEPIme (MAXIPIME) 2 g in sodium chloride 0.9 % 100 mL IVPB  Status:  Discontinued        2 g 200 mL/hr over 30 Minutes Intravenous Every 12 hours 10/28/21 1822 10/29/21 0753   10/28/21 2315  vancomycin (VANCOREADY) IVPB 1500 mg/300 mL  Status:  Discontinued         1,500 mg 150 mL/hr over 120 Minutes Intravenous  Once 10/28/21 2216 10/28/21 2217   10/28/21 2315  ceFEPIme (MAXIPIME) 2 g in sodium chloride 0.9 % 100 mL IVPB  Status:  Discontinued        2 g 200 mL/hr over 30 Minutes Intravenous Every 8 hours 10/28/21 2216 10/29/21 0753   10/28/21 1415  ceFEPIme (MAXIPIME) 2 g in sodium chloride 0.9 % 100 mL IVPB        2 g 200 mL/hr over 30 Minutes Intravenous  Once 10/28/21 1413 10/28/21 1639   10/28/21 1415  vancomycin (VANCOREADY) IVPB 1500 mg/300 mL        1,500 mg 150 mL/hr over 120 Minutes Intravenous  Once 10/28/21 1413 10/28/21 2045   10/28/21 1413  vancomycin variable dose per unstable renal function (pharmacist dosing)         Does not apply See admin instructions 10/28/21 1413          Assessment/Plan Hemoperitoneum likely secondary to bleeding mass in L liver -given patient's clinical status, DIC, etc, surgical intervention is not recommended at this time as the ability to stop bleeding from this type of operation would likely be almost impossible given her coagulopathy.  Her hgb is currently stable around 12 which  may mean that she has stopped bleeding.  We would recommend conservative management at this time.  It is also unclear what her overall prognosis is given such a significant down time and CPR prior to hospitalization.  This was discussed with the granddaughter at bedside and she understands.  All of her questions were answered.  I also discussed the patient with primary service MD and reviewed all new and available data.  FEN - NPO/IVFs/blood prn VTE - in DIC ID - per primary  Moderate Medical Decision Making  LOS: 3 days    Henreitta Cea , [facility] 10/31/2021, 9:57 AM Please see Amion for pager number during day hours 7:00am-4:30pm or 7:00am -11:30am on weekends

## 2021-10-31 NOTE — Progress Notes (Addendum)
NAME:  [name], MRN:  [id number], DOB:  08/04/1951, LOS: 3 ADMISSION DATE:  10/28/2021, CONSULTATION DATE: 10/28/21 REFERRING MD: Dr. Sherry Ruffing, CHIEF COMPLAINT:  Cardiac Arrest, VF   History of Present Illness:  [name] presenting with prolonged OOH VF arrest secondary to massive pulmonary embolus complicated by DIC.  Pertinent  Medical History  Anxiety / Depression  Fibromyalgia  Asthma  DM  GERD  HTN  HLD   Significant Hospital Events: Including procedures, antibiotic start and stop dates in addition to other pertinent events   1/1 Admit post VF arrest  1/2 S/p mechanical thrombectomy of right and left PA 1/3 Weaned to low dose epi and levo. In the evening increased pressor requirement and Hg drop to ~5. Found with large intraperitoneal/intraparenchymal hemorrhage extending into left subcapsular hematoma. Received PRBC x 5, FFP x 5 and Plt x 1  Interim History / Subjective:  In the evening increased pressor requirement and Hg drop to ~5. Found with large intraperitoneal/intraparenchymal hemorrhage extending into left subcapsular hematoma   Received PRBC x 5, FFP x 5 and Plt x 1  Objective   Blood pressure (!) 139/58, pulse (!) 101, temperature 98.8 F (37.1 C), resp. rate (!) 22, height 5\' 5"  (1.651 m), weight 93.6 kg, SpO2 97 %.    Vent Mode: PRVC FiO2 (%):  [35 %-40 %] 40 % Set Rate:  [16 bmp-30 bmp] 22 bmp Vt Set:  [440 mL-500 mL] 500 mL PEEP:  [5 cmH20] 5 cmH20 Plateau Pressure:  [15 cmH20-25 cmH20] 25 cmH20   Intake/Output Summary (Last 24 hours) at 10/31/2021 0725 Last data filed at 10/31/2021 0525 Gross per 24 hour  Intake 6257.66 ml  Output 50 ml  Net 6207.66 ml   Filed Weights   10/28/21 1900 10/29/21 0700 10/31/21 0500  Weight: 77.1 kg 81.1 kg 93.6 kg   Physical Exam: General: Critically ill-appearing, sedated, spontaneously opens eyes HENT: [place], AT, ETT in place Eyes: EOMI, no scleral icterus Respiratory: Anterior rhonchi bilaterally.  No wheezing   Cardiovascular: RRR, -M/R/G, no JVD GI: Hypoactive but soft Extremities:-Edema,-tenderness Neuro: Spontaneously opens eyes, no blink to threat, pupils pinpoint and sluggish, no withdrawal to noxious stimuli, +gag/cough  K ~5.5-5.7 LA 3.9 Worsening CR ~5.5  LFTs improving  CT A/P reviewed with large intraperitoneal/intraparenchymal hemorrhage extending into left subcapsular hematoma.   Resolved Hospital Problem list     Assessment & Plan:  Acute metabolic encephalopathy in setting of arrest Concerned for anoxic brain injury - Will evaluate with MRI when stable - Continue LTM. No seizures - PAD protocol: Propofol and Fentanyl for RASS goal -1  OOH prolonged Vfib Arrest with multiorgan failure Secondary to unprovoked massive pulmonary embolism with DIC ongoing severe bleeding limiting treatment options.  S/p thrombectomy with improving hemodynamics.  Given prolonged OOH arrest, poor neurological exam I do not think she is a good ECMO candidate. - Telemetry - Normothermia - Heparin gtt C/I in setting of bleed  Obstructive shock secondary to PE Complicated by hemorraghic shock 1/3, s/p 5 U PRBC, FFP x 5 and Plt x 1 Increased pressor requirement in the last 24 hours - Wean epi, levo and vasopressin for MAP goal >65 - Stress dose steroids - Trend LA - CBC q6h  Acute hypoxemic respiratory failure secondary to cardiac arrest, aspiration pneumonia - Continue neb: Brovana and Pulmicort - Vanc and Cefepime 1/1> - Full vent support - Daily SBT/WUA when qualified - PAD protocol: Fentanyl and Propofol  Retroperitoneal bleed DIC  S/p  vitamin K. No schistocytes on smear - Not a surgical candidate - Trend DIC panel  Acute renal failure, related to low perfusion during arrest - worsening. Currently anuric - Vas Cath in place - Consult Nephrology - Re-dose Lokelma this am - PRN ABG - Trend UOP/Cr - Avoid nephrotoxic agents  DM II with severe hyperglycemia r/o DKA Anion gap  acidosis - improving - CBG - IV insulin - SSI - Holding TF  Hypomag - Repleted  HTN, HLD, GERD, Anxiety, Depression, Fibromyalgia  Liver lesions NOS- MRI if recovers  GOC Prognosis grim in setting of DIC, PE and inability to anticoagulate in setting of hemoperitoneum and multi-organ failure. Source of bleed may be related to liver lesion concerning for malignancy however unable to confirm in acute illness. In addition to multiorgan failure, her neurological status post-arrest remains unclear and if poor, efforts may be futile. Family remains hopeful and wish to pursue current aggressive measures including dialysis.  Best Practice (right click and "Reselect all SmartList Selections" daily)  Diet/type: NPO DVT prophylaxis: SCD GI prophylaxis: PPI Lines: Central line Foley:  N/A Code Status:  full code Last date of multidisciplinary goals of care discussion: 1/1, continue aggressive care for now  The patient is critically ill with multiple organ systems failure and requires high complexity decision making for assessment and support, frequent evaluation and titration of therapies, application of advanced monitoring technologies and extensive interpretation of multiple databases.  Independent Critical Care Time: 34 Minutes.   Rodman Pickle, M.D. [facility] Pulmonary/Critical Care Medicine 10/31/2021 8:07 AM   Please see Amion for pager number to reach on-call Pulmonary and Critical Care Team.

## 2021-10-31 NOTE — Procedures (Signed)
Central Venous Catheter Insertion Procedure Note  [name]  [id number]  [birth year]  Date:10/31/21  Time:12:36 AM   Provider Performing:[place] A Taisia Fantini   Procedure: Insertion of Non-tunneled Central Venous [phone]) with US guidance ([id number])   Indication(s) Medication administration, Difficult access, and Hemodialysis  Consent Risks of the procedure as well as the alternatives and risks of each were explained to the patient and/or caregiver.  Consent for the procedure was obtained and is signed in the bedside chart  Anesthesia Topical only with 1% lidocaine   Timeout Verified patient identification, verified procedure, site/side was marked, verified correct patient position, special equipment/implants available, medications/allergies/relevant history reviewed, required imaging and test results available.  Sterile Technique Maximal sterile technique including full sterile barrier drape, hand hygiene, sterile gown, sterile gloves, mask, hair covering, sterile ultrasound probe cover (if used).  Procedure Description Initially attempted catheter placement with MAC introducer in the left IJ. Wire was unsuccessfully advanced x 2. Procedure was performed by Shearon Stalls NP. Given difficult anatomy, procedure was aborted for left femoral trialysis catheter placement.   Area of catheter insertion was cleaned with chlorhexidine and draped in sterile fashion.  With real-time ultrasound guidance a HD catheter was placed into the left femoral vein. Nonpulsatile blood flow and easy flushing noted in all ports.  The catheter was sutured in place and sterile dressing applied.  Complications/Tolerance None; patient tolerated the procedure well. Chest X-ray is ordered to verify placement for internal jugular or subclavian cannulation.   Chest x-ray is not ordered for femoral cannulation.  EBL Minimal  Specimen(s) None

## 2021-10-31 NOTE — Progress Notes (Addendum)
MTP- Blood Documentation - Initiated at 2135  Unit #: [id number] Q Component: Red Cells. LR ABO/Rh: A-Positive Volume: 315 Start: 2150 Stop: 4696  Unit #: [id number] M Component: THW PLS APHR ABO/Rh: A-Positive Volume: 192 Start: 2211 Stop: 2225  Unit #: [id number] C Component: Red Cells, LR ABO/Rh: A-Positive Volume: 315 Start: 2230 Stop: 7425  Unit #: [id number] O Component: Red Cells, LR ABO/Rh: A-Positive Volume: 315 Start: 2300 Stop: 2325  Unit #: [id number] 8 Component: THAWED PLASMA ABO/Rh: A-Positive Volume: 315 Start: 2330 Stop: 2350  All blood products were given under emergency released per Lucile Shutters, MD orders.

## 2021-10-31 NOTE — Progress Notes (Signed)
Nutrition Follow-up  DOCUMENTATION CODES:   Not applicable  INTERVENTION:   Need to consider TPN initiation if unable to resume TF/pt unable to tolerate TF  Tube Feeding Recommendations once able to resume: Vital AF 1.2 at 20 ml/hr with goal rate of  60 ml/hr Goal rate provides 108 g of protein, 1728 kcals, 1166 mL of free water  Add B complex with C  NUTRITION DIAGNOSIS:   Inadequate oral intake related to acute illness as evidenced by NPO status.  Continues  GOAL:   Patient will meet greater than or equal to 90% of their needs  Not Met  MONITOR:   Vent status, Labs, Weight trends, TF tolerance  REASON FOR ASSESSMENT:   Ventilator    ASSESSMENT:   71 yo female admitted with acute encephalopathy post OOH prolonged V.fib arrest with multiorgan failure with massive saddle PE and DIC, AKI. PMH includes DM, HTN, HLD, GERD, anxiety, depression, fibromyalgia  1/01 Admitted, VF arrest, Intubated, Concern for anoxic injury 1/02 Saddle PE s/p Mechanical thrombectomy 1/03 Hemodynamics improved post thrombectomy, weaning pressors, Hgb table, bleeding reduced, started trickle TF. Later in day, pressor requirement increased with Hgb drop to around 5 and found to have large intraperitoneal/intraparenchymal hemorrhage extending into left subscapular hematoma. Pt received PRBC x 5, FFP x 5, Platelets x 1  Noted grim prognosis, neurological status remains unclear post arrest, bleed likely related to liver lesion which is concerning for malignancy. Remains Full Code with aggressive measures currently  Pt remains on vent support; levophed (24 mcg/min), vasopressin (0.04 units/min), epinephrine (4 mcg/min) Propofol: OFF  TF on hold post events overnight. Started on just trickle TF yesterday. No other nutrition since admission  Nephrology consulted today, Noted plan to start CRRT  Labs: sodium 134 (L), potassium 6.2 (H) Meds: colace, insulin drip, miralax, protonix  Diet Order:    Diet Order             Diet NPO time specified  Diet effective now                   EDUCATION NEEDS:   Not appropriate for education at this time  Skin:  Skin Assessment: Reviewed RN Assessment  Last BM:  1/3  Height:   Ht Readings from Last 1 Encounters:  10/28/21 _0  (1.651 m)    Weight:   Wt Readings from Last 1 Encounters:  10/31/21 93.6 kg    Ideal Body Weight:     BMI:  Body mass index is 34.34 kg/m.  Estimated Nutritional Needs:   Kcal:  9340-6840 kcals  Protein:  85-115 g  Fluid:  >/= 1.8 L  Kerman Passey MS, RDN, LDN, CNSC Registered Dietitian III Clinical Nutrition RD Pager and On-Call Pager Number Located in [place]

## 2021-11-01 ENCOUNTER — Inpatient Hospital Stay (HOSPITAL_COMMUNITY): Payer: Medicare PPO

## 2021-11-01 DIAGNOSIS — I469 Cardiac arrest, cause unspecified: Secondary | ICD-10-CM | POA: Diagnosis not present

## 2021-11-01 HISTORY — PX: IR US GUIDE VASC ACCESS RIGHT: IMG2390

## 2021-11-01 HISTORY — PX: IR EMBO ART  VEN HEMORR LYMPH EXTRAV  INC GUIDE ROADMAPPING: IMG5450

## 2021-11-01 LAB — BPAM RBC
Blood Product Expiration Date: 202301202359
Blood Product Expiration Date: 202301212359
Blood Product Expiration Date: 202301212359
Blood Product Expiration Date: 202301212359
Blood Product Expiration Date: 202301212359
Blood Product Expiration Date: 202301222359
Blood Product Expiration Date: 202301222359
Blood Product Expiration Date: 202301222359
Blood Product Expiration Date: 202301222359
Blood Product Expiration Date: 202301222359
Blood Product Expiration Date: 202301222359
Blood Product Expiration Date: 202302032359
ISSUE DATE / TIME: 202301011405
ISSUE DATE / TIME: 202301011449
ISSUE DATE / TIME: 202301031820
ISSUE DATE / TIME: 202301031918
ISSUE DATE / TIME: 202301031918
ISSUE DATE / TIME: 202301032136
ISSUE DATE / TIME: 202301032136
ISSUE DATE / TIME: 202301032136
Unit Type and Rh: 5100
Unit Type and Rh: 5100
Unit Type and Rh: 6200
Unit Type and Rh: 6200
Unit Type and Rh: 6200
Unit Type and Rh: 6200
Unit Type and Rh: 6200
Unit Type and Rh: 6200
Unit Type and Rh: 6200
Unit Type and Rh: 6200
Unit Type and Rh: 6200
Unit Type and Rh: 6200

## 2021-11-01 LAB — PREPARE FRESH FROZEN PLASMA
Unit division: 0
Unit division: 0
Unit division: 0
Unit division: 0
Unit division: 0
Unit division: 0
Unit division: 0

## 2021-11-01 LAB — RENAL FUNCTION PANEL
Albumin: 1.9 g/dL — ABNORMAL LOW (ref 3.5–5.0)
Albumin: 1.9 g/dL — ABNORMAL LOW (ref 3.5–5.0)
Anion gap: 19 — ABNORMAL HIGH (ref 5–15)
Anion gap: 11 (ref 5–15)
BUN: 40 mg/dL — ABNORMAL HIGH (ref 8–23)
BUN: 33 mg/dL — ABNORMAL HIGH (ref 8–23)
CO2: 16 mmol/L — ABNORMAL LOW (ref 22–32)
CO2: 20 mmol/L — ABNORMAL LOW (ref 22–32)
Calcium: 7 mg/dL — ABNORMAL LOW (ref 8.9–10.3)
Calcium: 6.9 mg/dL — ABNORMAL LOW (ref 8.9–10.3)
Chloride: 101 mmol/L (ref 98–111)
Chloride: 104 mmol/L (ref 98–111)
Creatinine, Ser: 4.44 mg/dL — ABNORMAL HIGH (ref 0.44–1.00)
Creatinine, Ser: 3.37 mg/dL — ABNORMAL HIGH (ref 0.44–1.00)
GFR, Estimated: 10 mL/min — ABNORMAL LOW (ref >60)
GFR, Estimated: 14 mL/min — ABNORMAL LOW (ref >60)
Glucose, Bld: 91 mg/dL (ref 70–99)
Glucose, Bld: 162 mg/dL — ABNORMAL HIGH (ref 70–99)
Phosphorus: 6.6 mg/dL — ABNORMAL HIGH (ref 2.5–4.6)
Phosphorus: 5.3 mg/dL — ABNORMAL HIGH (ref 2.5–4.6)
Potassium: 5.6 mmol/L — ABNORMAL HIGH (ref 3.5–5.1)
Potassium: 4.9 mmol/L (ref 3.5–5.1)
Sodium: 136 mmol/L (ref 135–145)
Sodium: 135 mmol/L (ref 135–145)

## 2021-11-01 LAB — TYPE AND SCREEN
ABO/RH(D): A POS
Antibody Screen: NEGATIVE
Unit division: 0
Unit division: 0
Unit division: 0
Unit division: 0
Unit division: 0
Unit division: 0
Unit division: 0
Unit division: 0
Unit division: 0
Unit division: 0
Unit division: 0
Unit division: 0

## 2021-11-01 LAB — CBC
HCT: 13.9 % — ABNORMAL LOW (ref 36.0–46.0)
HCT: 27.1 % — ABNORMAL LOW (ref 36.0–46.0)
HCT: 25.5 % — ABNORMAL LOW (ref 36.0–46.0)
HCT: 24.6 % — ABNORMAL LOW (ref 36.0–46.0)
Hemoglobin: 4.6 g/dL — CL (ref 12.0–15.0)
Hemoglobin: 9.2 g/dL — ABNORMAL LOW (ref 12.0–15.0)
Hemoglobin: 9.1 g/dL — ABNORMAL LOW (ref 12.0–15.0)
Hemoglobin: 8.8 g/dL — ABNORMAL LOW (ref 12.0–15.0)
MCH: 27.9 pg (ref 26.0–34.0)
MCH: 29.6 pg (ref 26.0–34.0)
MCH: 30.1 pg (ref 26.0–34.0)
MCH: 30.1 pg (ref 26.0–34.0)
MCHC: 33.1 g/dL (ref 30.0–36.0)
MCHC: 33.9 g/dL (ref 30.0–36.0)
MCHC: 35.7 g/dL (ref 30.0–36.0)
MCHC: 35.8 g/dL (ref 30.0–36.0)
MCV: 84.2 fL (ref 80.0–100.0)
MCV: 87.1 fL (ref 80.0–100.0)
MCV: 84.4 fL (ref 80.0–100.0)
MCV: 84.2 fL (ref 80.0–100.0)
Platelets: 156 10*3/uL (ref 150–400)
Platelets: 130 10*3/uL — ABNORMAL LOW (ref 150–400)
Platelets: 104 10*3/uL — ABNORMAL LOW (ref 150–400)
Platelets: 93 10*3/uL — ABNORMAL LOW (ref 150–400)
RBC: 1.65 MIL/uL — ABNORMAL LOW (ref 3.87–5.11)
RBC: 3.11 MIL/uL — ABNORMAL LOW (ref 3.87–5.11)
RBC: 3.02 MIL/uL — ABNORMAL LOW (ref 3.87–5.11)
RBC: 2.92 MIL/uL — ABNORMAL LOW (ref 3.87–5.11)
RDW: 17.6 % — ABNORMAL HIGH (ref 11.5–15.5)
RDW: 15.9 % — ABNORMAL HIGH (ref 11.5–15.5)
RDW: 15.8 % — ABNORMAL HIGH (ref 11.5–15.5)
RDW: 15.9 % — ABNORMAL HIGH (ref 11.5–15.5)
WBC: 17.6 10*3/uL — ABNORMAL HIGH (ref 4.0–10.5)
WBC: 16.1 10*3/uL — ABNORMAL HIGH (ref 4.0–10.5)
WBC: 15.8 10*3/uL — ABNORMAL HIGH (ref 4.0–10.5)
WBC: 16.5 10*3/uL — ABNORMAL HIGH (ref 4.0–10.5)
nRBC: 0.3 % — ABNORMAL HIGH (ref 0.0–0.2)
nRBC: 0.4 % — ABNORMAL HIGH (ref 0.0–0.2)
nRBC: 0.3 % — ABNORMAL HIGH (ref 0.0–0.2)
nRBC: 0.2 % (ref 0.0–0.2)

## 2021-11-01 LAB — POCT I-STAT 7, (LYTES, BLD GAS, ICA,H+H)
Acid-base deficit: 7 mmol/L — ABNORMAL HIGH (ref 0.0–2.0)
Bicarbonate: 18.6 mmol/L — ABNORMAL LOW (ref 20.0–28.0)
Calcium, Ion: 0.9 mmol/L — ABNORMAL LOW (ref 1.15–1.40)
HCT: 25 % — ABNORMAL LOW (ref 36.0–46.0)
Hemoglobin: 8.5 g/dL — ABNORMAL LOW (ref 12.0–15.0)
O2 Saturation: 88 %
Patient temperature: 97.5
Potassium: 5.4 mmol/L — ABNORMAL HIGH (ref 3.5–5.1)
Sodium: 135 mmol/L (ref 135–145)
TCO2: 20 mmol/L — ABNORMAL LOW (ref 22–32)
pCO2 arterial: 36.6 mmHg (ref 32.0–48.0)
pH, Arterial: 7.311 — ABNORMAL LOW (ref 7.350–7.450)
pO2, Arterial: 57 mmHg — ABNORMAL LOW (ref 83.0–108.0)

## 2021-11-01 LAB — GLUCOSE, CAPILLARY
Glucose-Capillary: 120 mg/dL — ABNORMAL HIGH (ref 70–99)
Glucose-Capillary: 131 mg/dL — ABNORMAL HIGH (ref 70–99)
Glucose-Capillary: 138 mg/dL — ABNORMAL HIGH (ref 70–99)
Glucose-Capillary: 149 mg/dL — ABNORMAL HIGH (ref 70–99)
Glucose-Capillary: 159 mg/dL — ABNORMAL HIGH (ref 70–99)
Glucose-Capillary: 62 mg/dL — ABNORMAL LOW (ref 70–99)
Glucose-Capillary: 66 mg/dL — ABNORMAL LOW (ref 70–99)
Glucose-Capillary: 97 mg/dL (ref 70–99)
Glucose-Capillary: 98 mg/dL (ref 70–99)
Glucose-Capillary: 99 mg/dL (ref 70–99)

## 2021-11-01 LAB — BPAM FFP
Blood Product Expiration Date: 202301032359
Blood Product Expiration Date: 202301032359
Blood Product Expiration Date: 202301052359
Blood Product Expiration Date: 202301052359
Blood Product Expiration Date: 202301052359
Blood Product Expiration Date: 202301052359
Blood Product Expiration Date: 202301092359
Blood Product Expiration Date: 202301092359
ISSUE DATE / TIME: 202301012011
ISSUE DATE / TIME: 202301021224
ISSUE DATE / TIME: 202301032138
ISSUE DATE / TIME: 202301032138
ISSUE DATE / TIME: 202301040239
ISSUE DATE / TIME: 202301040239
ISSUE DATE / TIME: 202301042331
ISSUE DATE / TIME: 202301042331
Unit Type and Rh: 6200
Unit Type and Rh: 6200
Unit Type and Rh: 6200
Unit Type and Rh: 6200
Unit Type and Rh: 6200
Unit Type and Rh: 6200
Unit Type and Rh: 6200
Unit Type and Rh: 6200

## 2021-11-01 LAB — DIC (DISSEMINATED INTRAVASCULAR COAGULATION)PANEL
D-Dimer, Quant: 12.92 ug/mL-FEU — ABNORMAL HIGH (ref 0.00–0.50)
D-Dimer, Quant: 12.76 ug/mL-FEU — ABNORMAL HIGH (ref 0.00–0.50)
Fibrinogen: 437 mg/dL (ref 210–475)
Fibrinogen: 466 mg/dL (ref 210–475)
INR: 1.4 — ABNORMAL HIGH (ref 0.8–1.2)
INR: 1.2 (ref 0.8–1.2)
Platelets: 158 10*3/uL (ref 150–400)
Platelets: 97 10*3/uL — ABNORMAL LOW (ref 150–400)
Prothrombin Time: 16.7 seconds — ABNORMAL HIGH (ref 11.4–15.2)
Prothrombin Time: 15.5 seconds — ABNORMAL HIGH (ref 11.4–15.2)
Smear Review: NONE SEEN
Smear Review: NONE SEEN
aPTT: 33 seconds (ref 24–36)
aPTT: 31 seconds (ref 24–36)

## 2021-11-01 LAB — HEPATIC FUNCTION PANEL
ALT: 452 U/L — ABNORMAL HIGH (ref 0–44)
AST: 338 U/L — ABNORMAL HIGH (ref 15–41)
Albumin: 1.9 g/dL — ABNORMAL LOW (ref 3.5–5.0)
Alkaline Phosphatase: 102 U/L (ref 38–126)
Bilirubin, Direct: 0.7 mg/dL — ABNORMAL HIGH (ref 0.0–0.2)
Indirect Bilirubin: 0.8 mg/dL (ref 0.3–0.9)
Total Bilirubin: 1.5 mg/dL — ABNORMAL HIGH (ref 0.3–1.2)
Total Protein: 4.3 g/dL — ABNORMAL LOW (ref 6.5–8.1)

## 2021-11-01 LAB — BPAM PLATELET PHERESIS
Blood Product Expiration Date: 202301022359
Blood Product Expiration Date: 202301032359
Blood Product Expiration Date: 202301042359
ISSUE DATE / TIME: 202301011438
ISSUE DATE / TIME: 202301011443
ISSUE DATE / TIME: 202301040238
Unit Type and Rh: 5100
Unit Type and Rh: 600
Unit Type and Rh: 6200

## 2021-11-01 LAB — CULTURE, RESPIRATORY W GRAM STAIN: Special Requests: NORMAL

## 2021-11-01 LAB — BLOOD GAS, ARTERIAL
Acid-base deficit: 7.4 mmol/L — ABNORMAL HIGH (ref 0.0–2.0)
Bicarbonate: 17.3 mmol/L — ABNORMAL LOW (ref 20.0–28.0)
FIO2: 40
O2 Saturation: 92.9 %
Patient temperature: 36.5
pCO2 arterial: 32.6 mmHg (ref 32.0–48.0)
pH, Arterial: 7.343 — ABNORMAL LOW (ref 7.350–7.450)
pO2, Arterial: 68.3 mmHg — ABNORMAL LOW (ref 83.0–108.0)

## 2021-11-01 LAB — BASIC METABOLIC PANEL
Anion gap: 18 — ABNORMAL HIGH (ref 5–15)
Anion gap: 13 (ref 5–15)
BUN: 42 mg/dL — ABNORMAL HIGH (ref 8–23)
BUN: 36 mg/dL — ABNORMAL HIGH (ref 8–23)
CO2: 17 mmol/L — ABNORMAL LOW (ref 22–32)
CO2: 21 mmol/L — ABNORMAL LOW (ref 22–32)
Calcium: 7.1 mg/dL — ABNORMAL LOW (ref 8.9–10.3)
Calcium: 7 mg/dL — ABNORMAL LOW (ref 8.9–10.3)
Chloride: 102 mmol/L (ref 98–111)
Chloride: 100 mmol/L (ref 98–111)
Creatinine, Ser: 4.44 mg/dL — ABNORMAL HIGH (ref 0.44–1.00)
Creatinine, Ser: 3.58 mg/dL — ABNORMAL HIGH (ref 0.44–1.00)
GFR, Estimated: 10 mL/min — ABNORMAL LOW (ref >60)
GFR, Estimated: 13 mL/min — ABNORMAL LOW (ref >60)
Glucose, Bld: 90 mg/dL (ref 70–99)
Glucose, Bld: 162 mg/dL — ABNORMAL HIGH (ref 70–99)
Potassium: 5.6 mmol/L — ABNORMAL HIGH (ref 3.5–5.1)
Potassium: 5.2 mmol/L — ABNORMAL HIGH (ref 3.5–5.1)
Sodium: 137 mmol/L (ref 135–145)
Sodium: 134 mmol/L — ABNORMAL LOW (ref 135–145)

## 2021-11-01 LAB — COOXEMETRY PANEL
Carboxyhemoglobin: 1 % (ref 0.5–1.5)
Methemoglobin: 1.1 % (ref 0.0–1.5)
O2 Saturation: 50.1 %
Total hemoglobin: 10.2 g/dL — ABNORMAL LOW (ref 12.0–16.0)

## 2021-11-01 LAB — PREPARE PLATELET PHERESIS
Unit division: 0
Unit division: 0
Unit division: 0

## 2021-11-01 LAB — LACTIC ACID, PLASMA
Lactic Acid, Venous: 2.7 mmol/L (ref 0.5–1.9)
Lactic Acid, Venous: 2.2 mmol/L (ref 0.5–1.9)

## 2021-11-01 LAB — MAGNESIUM: Magnesium: 2.2 mg/dL (ref 1.7–2.4)

## 2021-11-01 LAB — VANCOMYCIN, RANDOM: Vancomycin Rm: 17

## 2021-11-01 LAB — PREPARE RBC (CROSSMATCH)

## 2021-11-01 MED ORDER — GELATIN ABSORBABLE 12-7 MM EX MISC
CUTANEOUS | Status: AC
  Filled 2021-11-01: qty 3

## 2021-11-01 MED ORDER — INSULIN ASPART 100 UNIT/ML IJ SOLN
4.0000 [IU] | INTRAMUSCULAR | Status: DC
  Administered 2021-11-01 (×2): 4 [IU] via SUBCUTANEOUS

## 2021-11-01 MED ORDER — CHLORHEXIDINE GLUCONATE 0.12 % MT SOLN
OROMUCOSAL | Status: AC
  Administered 2021-11-01: 15 mL via OROMUCOSAL
  Filled 2021-11-01: qty 15

## 2021-11-01 MED ORDER — VANCOMYCIN HCL IN DEXTROSE 1-5 GM/200ML-% IV SOLN
1000.0000 mg | INTRAVENOUS | Status: DC
  Administered 2021-11-01 – 2021-11-05 (×5): 1000 mg via INTRAVENOUS
  Filled 2021-11-01 (×5): qty 200

## 2021-11-01 MED ORDER — GELATIN ABSORBABLE 12-7 MM EX MISC
CUTANEOUS | Status: AC | PRN
  Administered 2021-11-01: 1 via TOPICAL

## 2021-11-01 MED ORDER — CALCIUM GLUCONATE-NACL 2-0.675 GM/100ML-% IV SOLN
2.0000 g | Freq: Once | INTRAVENOUS | Status: AC
  Administered 2021-11-01: 2000 mg via INTRAVENOUS
  Filled 2021-11-01: qty 100

## 2021-11-01 MED ORDER — GELATIN ABSORBABLE 12-7 MM EX MISC
CUTANEOUS | Status: AC | PRN
  Administered 2021-11-01: 1

## 2021-11-01 MED ORDER — DEXTROSE 50 % IV SOLN
12.5000 g | Freq: Once | INTRAVENOUS | Status: AC
  Administered 2021-11-01: 12.5 g via INTRAVENOUS

## 2021-11-01 MED ORDER — LIDOCAINE HCL 1 % IJ SOLN
INTRAMUSCULAR | Status: AC
  Filled 2021-11-01: qty 20

## 2021-11-01 MED ORDER — LIDOCAINE HCL 1 % IJ SOLN
INTRAMUSCULAR | Status: AC | PRN
  Administered 2021-11-01: 3 mL

## 2021-11-01 MED ORDER — SODIUM CHLORIDE 0.9% IV SOLUTION: Freq: Once | INTRAVENOUS | Status: AC

## 2021-11-01 NOTE — Progress Notes (Signed)
Patient transported to IR for procedure and back to 2H04 via the ventilator with no complications.

## 2021-11-01 NOTE — Progress Notes (Signed)
4 Days Post-Op  Subjective: Events from overnight noted, got embolized by IR, awaiting am labs  ROS: unable due to ventilated sedated status  Objective: Vital signs in last 24 hours: Temp:  [97.5 F (36.4 C)-99.1 F (37.3 C)] 97.5 F (36.4 C) (01/05 0640) Pulse Rate:  [84-113] 104 (01/05 0741) Resp:  [0-26] 22 (01/05 0741) BP: (86-163)/(44-94) 154/67 (01/05 0650) SpO2:  [78 %-100 %] 95 % (01/05 0741) Arterial Line BP: (74-204)/(39-82) 97/81 (01/05 0650) FiO2 (%):  [40 %-100 %] 40 % (01/05 0741) Last BM Date: 10/30/21  Intake/Output from previous day: 01/04 0701 - 01/05 0700 In: 4780.1 [I.V.:2168.3; Blood:2096; NG/GT:115.7; IV Piggyback:400.1] Out: 780 [Emesis/NG output:400] Intake/Output this shift: No intake/output data recorded.  PE: Abd: soft, fullness and more firm in RUQ, hypoactive BS  Lab Results:  Recent Labs    10/31/21 1932 10/31/21 2153 10/31/21 2154 11/01/21 0415 11/01/21 0707  WBC 18.6*  --   --  17.6*  --   HGB 8.5*  --    < > 4.6* 8.5*  HCT 24.3*  --    < > 13.9* 25.0*  PLT 60* 62*  --  158   156  --    < > = values in this interval not displayed.   BMET Recent Labs    10/31/21 1300 10/31/21 2154 11/01/21 0415 11/01/21 0707  NA 134*   < > 137   136 135  K 6.2*   < > 5.6*   5.6* 5.4*  CL 98  --  102   101  --   CO2 20*  --  17*   16*  --   GLUCOSE 144*  --  90   91  --   BUN 56*  --  42*   40*  --   CREATININE 5.93*  --  4.44*   4.44*  --   CALCIUM 6.8*  --  7.1*   7.0*  --    < > = values in this interval not displayed.   PT/INR Recent Labs    10/31/21 2153 11/01/21 0415  LABPROT 16.5* PENDING  INR 1.3* PENDING   CMP     Component Value Date/Time   NA 135 11/01/2021 0707   K 5.4 (H) 11/01/2021 0707   CL 102 11/01/2021 0415   CL 101 11/01/2021 0415   CO2 17 (L) 11/01/2021 0415   CO2 16 (L) 11/01/2021 0415   GLUCOSE 90 11/01/2021 0415   GLUCOSE 91 11/01/2021 0415   BUN 42 (H) 11/01/2021 0415   BUN 40 (H) 11/01/2021  0415   CREATININE 4.44 (H) 11/01/2021 0415   CREATININE 4.44 (H) 11/01/2021 0415   CREATININE 1.14 (H) 03/06/2017 1502   CALCIUM 7.1 (L) 11/01/2021 0415   CALCIUM 7.0 (L) 11/01/2021 0415   PROT 4.3 (L) 11/01/2021 0415   ALBUMIN 1.9 (L) 11/01/2021 0415   ALBUMIN 1.9 (L) 11/01/2021 0415   AST 338 (H) 11/01/2021 0415   ALT 452 (H) 11/01/2021 0415   ALKPHOS 102 11/01/2021 0415   BILITOT 1.5 (H) 11/01/2021 0415   GFRNONAA 10 (L) 11/01/2021 0415   GFRNONAA 10 (L) 11/01/2021 0415   GFRNONAA 50 (L) 03/06/2017 1502   GFRAA 51 (L) 04/11/2019 1121   GFRAA 57 (L) 03/06/2017 1502   Lipase  No results found for: LIPASE     Studies/Results: CT ABDOMEN PELVIS WO CONTRAST  Result Date: 10/30/2021 CLINICAL DATA:  Retroperitoneal hemorrhage. EXAM: CT ABDOMEN AND PELVIS WITHOUT CONTRAST TECHNIQUE: Multidetector CT imaging  of the abdomen and pelvis was performed following the standard protocol without IV contrast. COMPARISON:  October 28, 2021. FINDINGS: Lower chest: Large bilateral lower lobe airspace opacities are noted concerning for pneumonia with small adjacent pleural effusions. Hepatobiliary: Contrast is noted in the gallbladder. No biliary dilatation is noted. There appears to be hemorrhage within the left hepatic lobe which extends into large adjacent left subscapular hematoma or perihepatic hematoma. High-density fluid is noted around the liver concerning for hemorrhage. Pancreas: Unremarkable. No pancreatic ductal dilatation or surrounding inflammatory changes. Spleen: Normal in size without focal abnormality. Adrenals/Urinary Tract: Adrenal glands and kidneys are unremarkable. No hydronephrosis or renal obstruction is noted. Urinary bladder is decompressed secondary to Foley catheter. Stomach/Bowel: Nasogastric tube is seen looped within proximal stomach. The appendix appears normal. There is no evidence of bowel obstruction or inflammation. Vascular/Lymphatic: Aortic atherosclerosis. No enlarged  abdominal or pelvic lymph nodes. Reproductive: Uterus and bilateral adnexa are unremarkable. Other: There is interval development of a large amount of high density fluid in the pelvis consistent with intraperitoneal hemorrhage. It is seen in bilateral pericolic gutters and noted around the spleen and pancreas. Musculoskeletal: No acute or significant osseous findings. IMPRESSION: There is interval development of large intraperitoneal hemorrhage seen in the pelvis which extends into both pericolic gutters and around the liver and spleen. Also noted is interval development intraparenchymal hemorrhage within the left hepatic lobe which extends into large left subcapsular hematoma. Large bilateral lower lobe airspace opacities are noted most consistent with pneumonia with small adjacent pleural effusions. Critical Value/emergent results were called by telephone at the time of interpretation on 10/30/2021 at 9:20 pm to provider Dr. Lucile Shutters, who verbally acknowledged these results. Aortic Atherosclerosis (ICD10-I70.0). Electronically Signed   By: Marijo Conception M.D.   On: 10/30/2021 21:21   DG Chest 1 View  Result Date: 11/01/2021 CLINICAL DATA:  CPR.  Innovation. EXAM: CHEST  1 VIEW COMPARISON:  10/29/2021 FINDINGS: 0740 hours. Endotracheal tube tip is approximately 2.7 cm above the base of the carina. NG tube tip overlies the gastric fundus. Right IJ central line tip is positioned in the low right atrium. Low lung volumes with bibasilar atelectasis and left-sided pleural effusion. Telemetry leads overlie the chest. IMPRESSION: 1. Endotracheal tube tip is approximately 2.7 cm above the base of the carina. 2. Low volume film with bibasilar atelectasis and left pleural effusion. Electronically Signed   By: Misty Stanley M.D.   On: 11/01/2021 07:55    Anti-infectives: Anti-infectives (From admission, onward)    Start     Dose/Rate Route Frequency Ordered Stop   11/01/21 1800  vancomycin (VANCOREADY) IVPB 750 mg/150 mL   Status:  Discontinued        750 mg 150 mL/hr over 60 Minutes Intravenous Every 24 hours 10/31/21 1557 11/01/21 0625   11/01/21 1800  vancomycin (VANCOCIN) IVPB 1000 mg/200 mL premix        1,000 mg 200 mL/hr over 60 Minutes Intravenous Every 24 hours 11/01/21 0625     10/31/21 1000  metroNIDAZOLE (FLAGYL) IVPB 500 mg        500 mg 100 mL/hr over 60 Minutes Intravenous Every 12 hours 10/31/21 0830     10/30/21 1800  vancomycin (VANCOREADY) IVPB 750 mg/150 mL        750 mg 150 mL/hr over 60 Minutes Intravenous  Once 10/30/21 1509 10/30/21 1855   10/30/21 0600  ceFEPIme (MAXIPIME) 2 g in sodium chloride 0.9 % 100 mL IVPB  2 g 200 mL/hr over 30 Minutes Intravenous Every 24 hours 10/29/21 0753     10/29/21 1100  vancomycin (VANCOREADY) IVPB 750 mg/150 mL  Status:  Discontinued        750 mg 150 mL/hr over 60 Minutes Intravenous Every 12 hours 10/28/21 2216 10/28/21 2217   10/29/21 0500  ceFEPIme (MAXIPIME) 2 g in sodium chloride 0.9 % 100 mL IVPB  Status:  Discontinued        2 g 200 mL/hr over 30 Minutes Intravenous Every 12 hours 10/28/21 1822 10/29/21 0753   10/28/21 2315  vancomycin (VANCOREADY) IVPB 1500 mg/300 mL  Status:  Discontinued        1,500 mg 150 mL/hr over 120 Minutes Intravenous  Once 10/28/21 2216 10/28/21 2217   10/28/21 2315  ceFEPIme (MAXIPIME) 2 g in sodium chloride 0.9 % 100 mL IVPB  Status:  Discontinued        2 g 200 mL/hr over 30 Minutes Intravenous Every 8 hours 10/28/21 2216 10/29/21 0753   10/28/21 1415  ceFEPIme (MAXIPIME) 2 g in sodium chloride 0.9 % 100 mL IVPB        2 g 200 mL/hr over 30 Minutes Intravenous  Once 10/28/21 1413 10/28/21 1639   10/28/21 1415  vancomycin (VANCOREADY) IVPB 1500 mg/300 mL        1,500 mg 150 mL/hr over 120 Minutes Intravenous  Once 10/28/21 1413 10/28/21 2045   10/28/21 1413  vancomycin variable dose per unstable renal function (pharmacist dosing)         Does not apply See admin instructions 10/28/21 1413           Assessment/Plan Hemoperitoneum likely secondary to bleeding mass in L liver -given patient's clinical status, DIC, etc, surgical intervention is not recommended at this time as the ability to stop bleeding from this type of operation would likely be almost impossible given her coagulopathy and multiple co-morbidities -discussed with primary service, looking towards moving towards comfort care if bleeding persists despite IR embo. -we discussed she is not a surgical candidate. -we will sign off  FEN - NPO/IVFs/blood prn VTE - in DIC ID - per primary  Straightforward Medical Decision Making  LOS: 4 days    Henreitta Cea , [facility] Surgery 11/01/2021, 8:08 AM Please see Amion for pager number during day hours 7:00am-4:30pm or 7:00am -11:30am on weekends

## 2021-11-01 NOTE — Progress Notes (Signed)
Inpatient Diabetes Program Recommendations  AACE/ADA: New Consensus Statement on Inpatient Glycemic Control (2015)  Target Ranges:  Prepandial:   less than 140 mg/dL      Peak postprandial:   less than 180 mg/dL (1-2 hours)      Critically ill patients:  140 - 180 mg/dL   Lab Results  Component Value Date   GLUCAP 149 (H) 11/01/2021   HGBA1C 9.0 (H) 10/28/2021    Review of Glycemic Control  Latest Reference Range & Units 11/01/21 04:21 11/01/21 05:38 11/01/21 07:50  Glucose-Capillary 70 - 99 mg/dL 97 62 (L) 149 (H)   Diabetes history: DM 2 Outpatient Diabetes medications:  Lantus 22 units q HS, Prandin 2 mg bid Current orders for Inpatient glycemic control:  None  Inpatient Diabetes Program Recommendations:    If appropriate, consider adding Novolog correction sensitive q 4 hours.   Thanks,  Adah Perl, RN, BC-ADM Inpatient Diabetes Coordinator Pager [phone]  (8a-5p)

## 2021-11-01 NOTE — Progress Notes (Signed)
eLink Physician-Brief Progress Note Patient Name: NETANYA YAZDANI DOB: August 19, 1951 MRN: [id number]   Date of Service  11/01/2021  HPI/Events of Note  Anemia - Hgb = 4.6 d/t hepatic hemorrhage. Now s/p hepatic embolization by IR.   eICU Interventions  Will transfuse 3 units PRBC,     Intervention Category Major Interventions: Other:  Adyan Palau Cornelia Copa 11/01/2021, 5:12 AM

## 2021-11-01 NOTE — Consult Note (Signed)
Chief Complaint: Acute hepatic hemorrhage  Referring Physician(s): Sommer,Steven E  Patient Status: [facility] - In-pt  History of Present Illness: [name] is a 71 y.o. female who was recently admitted with cardiac arrest and saddle pulmonary embolism.  She underwent catheter directed thrombectomy of the PE.  She has more recently developed anemia, hypotension and tachycardia requiring vasopressors.  CT of the abdomen demonstrated large intraperitoneal hemorrhage with the source likely from the left liver lobe.  She has not improved despite aggressive medical treatment.  IR consulted for hepatic embolization.  I was unable to obtain history from the patient due to her intubated status.  I obtained history and discussed the procedure with her daughters [name] and [name].  Past Medical History:  Diagnosis Date   Anxiety    Arthritis    Asthma    Depression    Diabetes mellitus without complication (HCC)    Fibromyalgia    GERD (gastroesophageal reflux disease)    Hyperlipemia    Hypertension    PONV (postoperative nausea and vomiting)     Past Surgical History:  Procedure Laterality Date   COLONOSCOPY     DILATION AND CURETTAGE OF UTERUS     IR THROMBECT PRIM MECH INIT (INCLU) MOD SED  10/29/2021   IR US GUIDE VASC ACCESS RIGHT  10/29/2021   KNEE ARTHROSCOPY  2003,2005   left   SHOULDER ARTHROSCOPY     left   TRIGGER FINGER RELEASE  08/13/2012   Procedure: RELEASE TRIGGER FINGER/A-1 PULLEY;  Surgeon: [name]., MD;  Location: [facility];  Service: Orthopedics;  Laterality: Right;  long finger   TUBAL LIGATION      Allergies: Aspirin, Benadryl [diphenhydramine], Darvon [propoxyphene], Hydrocodone bit-homatrop mbr, Metformin, Other, Pentazocine, Percocet [oxycodone-acetaminophen], Sulfa antibiotics, Tetracaine hcl, Tetracyclines & related, and Tramadol  Medications: Prior to Admission medications   Medication Sig Start Date End Date Taking?  Authorizing Provider  acetaminophen (TYLENOL) 500 MG tablet Take 500-1,000 mg by mouth every 6 (six) hours as needed for mild pain or headache.    [provider]  albuterol (ACCUNEB) 1.25 MG/3ML nebulizer solution Take 1.25 mg by nebulization every 8 (eight) hours as needed for wheezing or shortness of breath. 07/04/21   [provider]  albuterol (PROVENTIL HFA;VENTOLIN HFA) 108 (90 BASE) MCG/ACT inhaler Inhale 1-2 puffs into the lungs every 4 (four) hours as needed for wheezing or shortness of breath.     [provider]  ALPRAZolam Duanne Moron) 0.25 MG tablet Take 0.25 mg by mouth daily as needed for anxiety. 02/19/19   [provider]  baclofen (LIORESAL) 10 MG tablet TAKE 1 TABLET(10 MG) BY MOUTH TWICE DAILY AS NEEDED FOR MUSCLE SPASMS Patient taking differently: Take 10 mg by mouth 2 (two) times daily as needed for muscle spasms. 06/15/21   Bo Merino, MD  betamethasone dipropionate (DIPROLENE) 0.05 % ointment 1 application to affected area 03/15/15   [provider]  diclofenac Sodium (VOLTAREN) 1 % GEL Apply 4 g topically daily as needed (pain).    [provider]  diphenhydrAMINE HCl (BENADRYL ALLERGY PO)     [provider]  Elastic Bandages & Supports (MEDICAL COMPRESSION STOCKINGS) [place] 1 each by Does not apply route every 6 (six) hours as needed (swelling). 04/28/19   Wieters, Hallie C, PA-C  fluticasone (FLONASE) 50 MCG/ACT nasal spray Place 2 sprays into both nostrils daily. 08/07/10   [provider]  gabapentin (NEURONTIN) 300 MG capsule Take 300 mg  by mouth 2 (two) times daily.    [provider]  hydrochlorothiazide (HYDRODIURIL) 12.5 MG tablet Take 12.5 mg by mouth daily. 02/10/19   [provider]  LANTUS SOLOSTAR 100 UNIT/ML Solostar Pen Inject 22 Units into the skin at bedtime. 12/09/18   [provider]  pantoprazole (PROTONIX) 40 MG tablet Take 40 mg by mouth 2 (two) times daily.     [provider]  repaglinide (PRANDIN) 2 MG tablet Take 2 mg by mouth 2 (two) times daily before a meal.    [provider]  simvastatin (ZOCOR) 40 MG tablet Take 40 mg by mouth daily.    [provider]  temazepam (RESTORIL) 15 MG capsule TAKE 1 CAPSULE(15 MG) BY MOUTH AT BEDTIME AS NEEDED Patient taking differently: Take 15 mg by mouth at bedtime as needed for sleep. 10/15/21   Ofilia Neas, PA-C  Vitamin D, Ergocalciferol, (DRISDOL) 1.25 MG (50000 UNIT) CAPS capsule Take 50,000 Units by mouth once a week. 10/08/21   [provider]     Family History  Problem Relation Age of Onset   Diabetes Mother    Heart attack Father    Diabetes Sister    Throat cancer Brother    Hypertension Daughter     Social History   Socioeconomic History   Marital status: Divorced    Spouse name: Not on file   Number of children: Not on file   Years of education: Not on file   Highest education level: Not on file  Occupational History   Not on file  Tobacco Use   Smoking status: Former    Packs/day: 0.50    Years: 20.00    Pack years: 10.00    Types: Cigarettes    Quit date: 08/10/2010    Years since quitting: 11.2   Smokeless tobacco: Never  Vaping Use   Vaping Use: Never used  Substance and Sexual Activity   Alcohol use: No   Drug use: No   Sexual activity: Not on file  Other Topics Concern   Not on file  Social History Narrative   Not on file   Social Determinants of Health   Financial Resource Strain: Not on file  Food Insecurity: Not on file  Transportation Needs: Not on file  Physical Activity: Not on file  Stress: Not on file  Social Connections: Not on file   Review of Systems: A 12 point ROS discussed and pertinent positives are indicated in the HPI above.  All other systems are negative.  Review of Systems  Vital Signs: BP (!) 114/51 (BP Location: Left Wrist)    Pulse (!) 110    Temp 97.6 F (36.4 C)    Resp (!) 22    Ht 5\' 5"   (1.651 m)    Wt 93.6 kg    SpO2 100%    BMI 34.34 kg/m   Physical Exam  Imaging: CT ABDOMEN PELVIS WO CONTRAST  Result Date: 10/30/2021 CLINICAL DATA:  Retroperitoneal hemorrhage. EXAM: CT ABDOMEN AND PELVIS WITHOUT CONTRAST TECHNIQUE: Multidetector CT imaging of the abdomen and pelvis was performed following the standard protocol without IV contrast. COMPARISON:  October 28, 2021. FINDINGS: Lower chest: Large bilateral lower lobe airspace opacities are noted concerning for pneumonia with small adjacent pleural effusions. Hepatobiliary: Contrast is noted in the gallbladder. No biliary dilatation is noted. There appears to be hemorrhage within the left hepatic lobe which extends into large adjacent left subscapular hematoma or perihepatic hematoma. High-density fluid is noted  around the liver concerning for hemorrhage. Pancreas: Unremarkable. No pancreatic ductal dilatation or surrounding inflammatory changes. Spleen: Normal in size without focal abnormality. Adrenals/Urinary Tract: Adrenal glands and kidneys are unremarkable. No hydronephrosis or renal obstruction is noted. Urinary bladder is decompressed secondary to Foley catheter. Stomach/Bowel: Nasogastric tube is seen looped within proximal stomach. The appendix appears normal. There is no evidence of bowel obstruction or inflammation. Vascular/Lymphatic: Aortic atherosclerosis. No enlarged abdominal or pelvic lymph nodes. Reproductive: Uterus and bilateral adnexa are unremarkable. Other: There is interval development of a large amount of high density fluid in the pelvis consistent with intraperitoneal hemorrhage. It is seen in bilateral pericolic gutters and noted around the spleen and pancreas. Musculoskeletal: No acute or significant osseous findings. IMPRESSION: There is interval development of large intraperitoneal hemorrhage seen in the pelvis which extends into both pericolic gutters and around the liver and spleen. Also noted is interval  development intraparenchymal hemorrhage within the left hepatic lobe which extends into large left subcapsular hematoma. Large bilateral lower lobe airspace opacities are noted most consistent with pneumonia with small adjacent pleural effusions. Critical Value/emergent results were called by telephone at the time of interpretation on 10/30/2021 at 9:20 pm to provider Dr. Lucile Shutters, who verbally acknowledged these results. Aortic Atherosclerosis (ICD10-I70.0). Electronically Signed   By: Marijo Conception M.D.   On: 10/30/2021 21:21   CT HEAD WO CONTRAST (5MM)  Result Date: 10/28/2021 CLINICAL DATA:  Mental status change, unknown cause EXAM: CT HEAD WITHOUT CONTRAST TECHNIQUE: Contiguous axial images were obtained from the base of the skull through the vertex without intravenous contrast. COMPARISON:  None. FINDINGS: Brain: No evidence of large-territorial acute infarction. No parenchymal hemorrhage. No mass lesion. No extra-axial collection. No mass effect or midline shift. No hydrocephalus. Basilar cisterns are patent. Vascular: No hyperdense vessel. Skull: No acute fracture or focal lesion. Sinuses/Orbits: Mucosal thickening within bilateral nasal cavities. Paranasal sinuses and mastoid air cells are clear. The orbits are unremarkable. Other: None. IMPRESSION: No acute intracranial abnormality. Electronically Signed   By: Iven Finn M.D.   On: 10/28/2021 17:31   CT Angio Chest Pulmonary Embolism (PE) W or WO Contrast  Result Date: 10/28/2021 CLINICAL DATA:  Pulmonary embolism (PE) suspected, high prob echo with RV strain EXAM: CT ANGIOGRAPHY CHEST WITH CONTRAST TECHNIQUE: Multidetector CT imaging of the chest was performed using the standard protocol during bolus administration of intravenous contrast. Multiplanar CT image reconstructions and MIPs were obtained to evaluate the vascular anatomy. CONTRAST:  72mL OMNIPAQUE IOHEXOL 350 MG/ML SOLN COMPARISON:  None. FINDINGS: Lines and tubes: Right mainstem bronchus  intubation with tip terminating just distal to the carina. Enteric tube coursing below the hemidiaphragm with tip within the gastric lumen. There is a 5.2 cm density within the right lower neck that is partially visualized that may be related to the insertion site of a right internal jugular central venous catheter with tip terminating in the right atrium. Cardiovascular: Satisfactory opacification of the pulmonary arteries to the segmental level. Saddle pulmonary embolus extending to the segmental and subsegmental levels in the left lower lobe in all three right lobes. Associated increased right to left ventricular ratio 1.9. No significant pericardial effusion. The thoracic aorta is normal in caliber. No atherosclerotic plaque of the thoracic aorta. No coronary artery calcifications. Mediastinum/Nodes: No definite pneumomediastinum. No enlarged mediastinal, hilar, or axillary lymph nodes. Thyroid gland, trachea, and esophagus demonstrate no significant findings. Lungs/Pleura: Bilateral lower lobe subsegmental atelectasis. Lingular subsegmental atelectasis. No pulmonary nodule. No pulmonary mass. No  pleural effusion. No pneumothorax. Upper Abdomen: Query partially visualized right upper mid abdomen fat stranding. Musculoskeletal: No suspicious lytic or blastic osseous lesions. Acute minimally displaced left anterior fourth and fifth rib fractures. Acute nondisplaced mid sternal body fracture. Likely old nondisplaced left posterior rib fractures. Multilevel degenerative changes of the spine. Review of the MIP images confirms the above findings. IMPRESSION: 1. Saddle pulmonary embolus extending to the segmental and subsegmental levels in the left lower lobe and in all three right lobes. Associated right heart strain. No definite pulmonary infarction. Positive for acute PE with CT evidence of right heart strain (RV/LV Ratio = 1.9) consistent with at least submassive (intermediate risk) PE. The presence of right heart  strain has been associated with an increased risk of morbidity and mortality. Please refer to the "PE Focused" order set in EPIC. 2. Right mainstem bronchus intubation with tip terminating just distal to the carina. Recommend retracting by 3 cm. 3. A 5.2 cm density within the right lower neck that is partially visualized that may be related to a hematoma along the insertion site of a right internal jugular central venous catheter with tip terminating in the right atrium. Limited evaluation due to timing of contrast. 4. Acute minimally displaced left anterior 4th and 5th rib fractures. 5.  Acute nondisplaced mid sternal body fracture. 6. Query partially visualized right upper mid abdomen fat stranding. Concern for underlying mass or inflammation. Recommend CT abdomen pelvis with contrast for further evaluation. 7. Bilateral lower lobe and lingular consolidation may represent atelectasis with superimposed infection/inflammation (such as aspiration pneumonia) not excluded. These results were called by telephone at the time of interpretation on 10/28/2021 at 5:17 pm to provider Dr. Ernest Mallick , who verbally acknowledged these results. Electronically Signed   By: Iven Finn M.D.   On: 10/28/2021 17:30   CT ABDOMEN PELVIS W CONTRAST  Result Date: 10/28/2021 CLINICAL DATA:  Anemia. EXAM: CT ABDOMEN AND PELVIS WITH CONTRAST TECHNIQUE: Multidetector CT imaging of the abdomen and pelvis was performed using the standard protocol following bolus administration of intravenous contrast. CONTRAST:  123mL OMNIPAQUE IOHEXOL 300 MG/ML  SOLN COMPARISON:  CT scan of same day. FINDINGS: Lower chest: Left upper lobe atelectasis or pneumonia is noted. Mild posterior basilar subsegmental atelectasis is noted. Pulmonary embolus is noted in right pulmonary artery as noted on prior CT scan of same day. Hepatobiliary: No gallstones or biliary dilatation is noted. Large ill-defined low density is noted in left hepatic lobe measuring 3.7  x 2.4 cm. Mild amount of fluid is noted in the gallbladder fossa and in Morison's pouch concerning for possible cholecystitis. Pancreas: Unremarkable. No pancreatic ductal dilatation or surrounding inflammatory changes. Spleen: Normal in size without focal abnormality. Adrenals/Urinary Tract: Adrenal glands and kidneys appear normal. Hydronephrosis or renal obstruction is noted. Urinary bladder is decompressed secondary to Foley catheter. Stomach/Bowel: Stomach and appendix are unremarkable. There is no evidence of bowel obstruction. Colon is unremarkable. Fluid-filled small bowel loops are noted with minimal wall and fold thickening and enhancement suggesting mild enteritis. Vascular/Lymphatic: Aortic atherosclerosis. No enlarged abdominal or pelvic lymph nodes. Reproductive: Uterus and bilateral adnexa are unremarkable. Other: No abdominal wall hernia or abnormality. No abdominopelvic ascites. Musculoskeletal: No acute or significant osseous findings. IMPRESSION: Mild amount of fluid is noted in the gallbladder fossa and in Morrison's pouch which may represent cholecystitis. No cholelithiasis is noted. Ultrasound is recommended for further evaluation. Ill-defined 3.7 x 2.4 cm low density is noted in the left hepatic lobe of uncertain etiology.  When the patient is clinically stable and able to follow directions and hold their breath (preferably as an outpatient) further evaluation with dedicated abdominal MRI should be considered. Fluid-filled small bowel loops are noted with minimal wall and fold thickening and enhancement suggesting enteritis. Aortic Atherosclerosis (ICD10-I70.0). Electronically Signed   By: Marijo Conception M.D.   On: 10/28/2021 18:46   IR THROMBECT PRIM MECH INIT (INCLU) MOD SED  Result Date: 10/29/2021 INDICATION: High-risk pulmonary embolism with hemodynamic instability requiring pressor support, bilateral pulmonary embolism with saddle component EXAM: 1. Ultrasound-guided puncture of the  right common femoral vein 2. Catheterization of the main and right pulmonary arteries with bilateral pulmonary artery angiogram 3. Mechanical/aspiration thrombectomy of the right and left pulmonary arteries COMPARISON:  CTA chest previous day MEDICATIONS: None. ANESTHESIA/SEDATION: Sedation provided by the critical care team. Refer to separate critical care records. FLUOROSCOPY TIME:  FLUOROSCOPY TIME Fluoroscopy Time: 12.3 minutes with 4 exposures COMPLICATIONS: None immediate. TECHNIQUE: Informed written consent was obtained from the patient after a thorough discussion of the procedural risks, benefits and alternatives. All questions were addressed. Maximal Sterile Barrier Technique was utilized including caps, mask, sterile gowns, sterile gloves, sterile drape, hand hygiene and skin antiseptic. A timeout was performed prior to the initiation of the procedure. The patient was placed supine on the exam table. Ultrasound of the right groin was performed. This demonstrated a widely patent right common femoral vein. A permanent image was stored in the electronic medical record. Using ultrasound guidance, the right common femoral vein was directly punctured with a 21 gauge micropuncture set with visualization of needle entry into the vessel lumen. Wire was advanced centrally into the IVC, followed by serial tract dilation to accommodate a short 6 [place] sheath. Initial attempts to catheterize the main pulmonary artery using combination of an Omni Flush catheter and Rosen guidewire were unsuccessful. The sheath was exchanged for a long 7 [place] sheath, and the pulmonary artery was catheterized using a combination of a single angled pigtail catheter and Rosen guidewire. Wire and catheter were directed into the right main pulmonary artery. Wire was exchanged for a short tapered stiff Amplatz wire. Over this wire, the access site was serially dilated to accommodate a 24 [place] sheath. Next, the Inari 24 [place] FlowTriever  aspiration thrombectomy catheter was then advanced into the distal aspect of the right main pulmonary artery. Aspiration thrombectomy of the right main pulmonary artery was performed. Large volume of acute appearing clot was removed. The aspiration thrombectomy catheter was slowly withdrawn into the main pulmonary artery/RVOT, and thrombectomy of the saddle component and left pulmonary artery were performed, with removal of additional acute appearing thrombus. With the aspiration thrombectomy catheter positioned within the main pulmonary artery, bilateral pulmonary artery angiogram was performed. Bilateral pulmonary artery angiogram demonstrates complete resolution of the saddle pulmonary embolism in clot burden within the main pulmonary arteries. There remains scattered residual small volume subsegmental embolism. The patient's clinical parameters had improved with reduction in vasopressor requirement and improvement in blood pressure at the end of the procedure. Given this improvement and no further large volume embolism identified on completion angiography, the procedure was stopped at this point. All catheters and wires were removed. Hemostasis was achieved at the access site using a pursestring suture. A clean dressing was placed. The patient tolerated the procedure well without immediate complication. FINDINGS: As above. IMPRESSION: Successful mechanical/aspiration thrombectomy of high-risk bilateral pulmonary embolism using the Inari FlowTriever aspiration thrombectomy catheter. Completion angiography demonstrated near-complete resolution of the saddle  component and clot burden within the main pulmonary arteries. Electronically Signed   By: Albin Felling M.D.   On: 10/29/2021 16:33   IR US Guide Vasc Access Right  Result Date: 10/29/2021 INDICATION: High-risk pulmonary embolism with hemodynamic instability requiring pressor support, bilateral pulmonary embolism with saddle component EXAM: 1.  Ultrasound-guided puncture of the right common femoral vein 2. Catheterization of the main and right pulmonary arteries with bilateral pulmonary artery angiogram 3. Mechanical/aspiration thrombectomy of the right and left pulmonary arteries COMPARISON:  CTA chest previous day MEDICATIONS: None. ANESTHESIA/SEDATION: Sedation provided by the critical care team. Refer to separate critical care records. FLUOROSCOPY TIME:  FLUOROSCOPY TIME Fluoroscopy Time: 12.3 minutes with 4 exposures COMPLICATIONS: None immediate. TECHNIQUE: Informed written consent was obtained from the patient after a thorough discussion of the procedural risks, benefits and alternatives. All questions were addressed. Maximal Sterile Barrier Technique was utilized including caps, mask, sterile gowns, sterile gloves, sterile drape, hand hygiene and skin antiseptic. A timeout was performed prior to the initiation of the procedure. The patient was placed supine on the exam table. Ultrasound of the right groin was performed. This demonstrated a widely patent right common femoral vein. A permanent image was stored in the electronic medical record. Using ultrasound guidance, the right common femoral vein was directly punctured with a 21 gauge micropuncture set with visualization of needle entry into the vessel lumen. Wire was advanced centrally into the IVC, followed by serial tract dilation to accommodate a short 6 [place] sheath. Initial attempts to catheterize the main pulmonary artery using combination of an Omni Flush catheter and Rosen guidewire were unsuccessful. The sheath was exchanged for a long 7 [place] sheath, and the pulmonary artery was catheterized using a combination of a single angled pigtail catheter and Rosen guidewire. Wire and catheter were directed into the right main pulmonary artery. Wire was exchanged for a short tapered stiff Amplatz wire. Over this wire, the access site was serially dilated to accommodate a 24 [place] sheath.  Next, the Inari 24 [place] FlowTriever aspiration thrombectomy catheter was then advanced into the distal aspect of the right main pulmonary artery. Aspiration thrombectomy of the right main pulmonary artery was performed. Large volume of acute appearing clot was removed. The aspiration thrombectomy catheter was slowly withdrawn into the main pulmonary artery/RVOT, and thrombectomy of the saddle component and left pulmonary artery were performed, with removal of additional acute appearing thrombus. With the aspiration thrombectomy catheter positioned within the main pulmonary artery, bilateral pulmonary artery angiogram was performed. Bilateral pulmonary artery angiogram demonstrates complete resolution of the saddle pulmonary embolism in clot burden within the main pulmonary arteries. There remains scattered residual small volume subsegmental embolism. The patient's clinical parameters had improved with reduction in vasopressor requirement and improvement in blood pressure at the end of the procedure. Given this improvement and no further large volume embolism identified on completion angiography, the procedure was stopped at this point. All catheters and wires were removed. Hemostasis was achieved at the access site using a pursestring suture. A clean dressing was placed. The patient tolerated the procedure well without immediate complication. FINDINGS: As above. IMPRESSION: Successful mechanical/aspiration thrombectomy of high-risk bilateral pulmonary embolism using the Inari FlowTriever aspiration thrombectomy catheter. Completion angiography demonstrated near-complete resolution of the saddle component and clot burden within the main pulmonary arteries. Electronically Signed   By: Albin Felling M.D.   On: 10/29/2021 16:33   DG Chest Port 1 View  Result Date: 10/29/2021 CLINICAL DATA:  Respiratory failure. EXAM: PORTABLE  CHEST 1 VIEW COMPARISON:  10/28/2021. FINDINGS: ET tube tip is above the carina.  Nasogastric tube tip and side port are in the gastric fundus. Right IJ catheter tip is in the right atrium. Stable cardiomediastinal contours. Asymmetric opacification within the left midlung and left lower lung appears increased from the previous exam. Right lung remains clear. IMPRESSION: 1. Worsening aeration to the left midlung and left lower lung. 2. Stable support apparatus. Electronically Signed   By: Kerby Moors M.D.   On: 10/29/2021 07:07   DG CHEST PORT 1 VIEW  Result Date: 10/28/2021 CLINICAL DATA:  Post CPR.  Central line placement. EXAM: PORTABLE CHEST 1 VIEW COMPARISON:  10/28/2021 at 1:15 p.m. FINDINGS: Right internal jugular central venous line has its tip in the right atrium. No pneumothorax. No other change from the earlier study. IMPRESSION: 1. Right IJ central venous line has its tip in the right atrium. No pneumothorax. Electronically Signed   By: Lajean Manes M.D.   On: 10/28/2021 14:52   DG Chest Portable 1 View  Result Date: 10/28/2021 CLINICAL DATA:  Status post cardiac arrest and intubation. EXAM: PORTABLE CHEST 1 VIEW COMPARISON:  10/23/2020. FINDINGS: Endotracheal tube tip projects 1 cm above the carinal. Nasal/orogastric tube passes well below the diaphragm into the stomach. Cardiac silhouette normal in size. Normal mediastinal and hilar contours. Clear lungs. IMPRESSION: 1. Well-positioned endotracheal tube and nasal/orogastric tube. 2. No acute cardiopulmonary disease. Electronically Signed   By: Lajean Manes M.D.   On: 10/28/2021 13:39   DG Abd Portable 1 View  Result Date: 10/28/2021 CLINICAL DATA:  Nasogastric tube placement. EXAM: PORTABLE ABDOMEN - 1 VIEW COMPARISON:  None. FINDINGS: Nasogastric tube extends well below diaphragm to curl within the to stomach in the left upper quadrant. IMPRESSION: Well-positioned nasogastric tube. Electronically Signed   By: Lajean Manes M.D.   On: 10/28/2021 13:39   EEG adult  Result Date: 10/28/2021 Lora Havens, MD      10/28/2021  9:19 PM Patient Name: BYRDIE MIYAZAKI MRN: [id number] Epilepsy Attending: Lora Havens Referring Physician/Provider: Donita Brooks, NP Date: 10/28/2021 Duration: 21.01 mins Patient history: 71yo F s/p cardiac arrest. EEG to evaluate for seizure Level of alertness: comatos AEDs during EEG study: None Technical aspects: This EEG study was done with scalp electrodes positioned according to the 10-20 International system of electrode placement. Electrical activity was acquired at a sampling rate of 500Hz  and reviewed with a high frequency filter of 70Hz  and a low frequency filter of 1Hz . EEG data were recorded continuously and digitally stored. Description: EEG showed near continuous generalized low amplitude 3 to 6 Hz theta-delta slowing admixed with 2-4 seconds of eeg attenuation. There was also 15-18hz  frontocentral beta activity intermittently. Hyperventilation and photic stimulation were not performed.   ABNORMALITY - Continuous slow, generalized - Background attenuation, generalized IMPRESSION: This study is suggestive of severe to profound diffuse encephalopathy, nonspecific etiology. No seizures or epileptiform discharges were seen throughout the recording. Priyanka Barbra Sarks   Overnight EEG with video  Result Date: 10/29/2021 Lora Havens, MD     10/30/2021  8:50 AM Patient Name: TINLEE NAVARRETTE MRN: [id number] Epilepsy Attending: Lora Havens Referring Physician/Provider: Donita Brooks, NP Duration: 10/28/2021 1920 to 10/29/2021 1920  Patient history: 71yo F s/p cardiac arrest. EEG to evaluate for seizure  Level of alertness: lethargic  AEDs during EEG study: None  Technical aspects: This EEG study was done with scalp electrodes positioned according to the 10-20 International  system of electrode placement. Electrical activity was acquired at a sampling rate of 500Hz  and reviewed with a high frequency filter of 70Hz  and a low frequency filter of 1Hz . EEG data were recorded continuously and  digitally stored.  Description: EEG showed near continuous generalized low amplitude 3 to 6 Hz theta-delta slowing admixed with 2-4 seconds of eeg attenuation. There was also 15-18hz  frontocentral beta activity intermittently. Hyperventilation and photic stimulation were not performed.   Patient event button was pressed on 10/29/2021 at 1057 and 1213 for shaking while being repositioned.  Concomitant EEG before, during and after the event did not show any EEG changes suggest seizure.  ABNORMALITY - Continuous slow, generalized - Background attenuation, generalized  IMPRESSION: This study is suggestive of severe to profound diffuse encephalopathy, nonspecific etiology. No seizures or epileptiform discharges were seen throughout the recording. Patient event button was pressed on 10/29/2021 at 1057 and 1213 for shaking while being repositioned without concomitant EEG change.  These episodes were not epileptic.  Lora Havens   ECHOCARDIOGRAM COMPLETE  Result Date: 10/28/2021    ECHOCARDIOGRAM REPORT   Patient Name:   AVION PATELLA Date of Exam: 10/28/2021 Medical Rec #:  [id number]       Height:       65.0 in Accession #:    [id number]      Weight:       170.0 lb Date of Birth:  1950/11/11       BSA:          1.846 m Patient Age:    8 years        BP:           110/69 mmHg Patient Gender: F               HR:           104 bpm. Exam Location:  Inpatient Procedure: 2D Echo, Color Doppler and Limited Color Doppler STAT ECHO Indications:    Cardiac arrest  History:        Patient has prior history of Echocardiogram examinations.                 Hemoptysis. Respiratory failure requiring mechanical                 ventilation.  Sonographer:    Merrie Roof RDCS Referring Phys: [id number] [facility]  Sonographer Comments: Suboptimal apical window, echo performed with patient supine and on artificial respirator and Technically difficult study due to poor echo windows. IMPRESSIONS  1. Right ventricular function is abnormal. In  the setting of cardiac arrest, would rule out pulmonary embolism if clinically indicated.  2. Left ventricular ejection fraction, by estimation, is 70 to 75%. The left ventricle has hyperdynamic function. The left ventricle has no regional wall motion abnormalities. There is moderate concentric left ventricular hypertrophy. Left ventricular diastolic parameters are indeterminate.  3. Hypokinesis of the mid-apical right ventricle with normal systolic function at the base. This pattern is opposite of McConnell's sign, which can be seen in acute pulmonary embolism. Right ventricular systolic function is moderately reduced. The right  ventricular size is normal. There is severely elevated pulmonary artery systolic pressure.  4. Systolic anterior motion (SAM) of the mitral valve. The mitral valve is normal in structure. No evidence of mitral valve regurgitation. No evidence of mitral stenosis.  5. Tricuspid valve regurgitation is moderate to severe.  6. The aortic valve is tricuspid. Aortic valve regurgitation is not visualized. No  aortic stenosis is present.  7. The inferior vena cava is normal in size with <50% respiratory variability, suggesting right atrial pressure of 8 mmHg. FINDINGS  Left Ventricle: Left ventricular ejection fraction, by estimation, is 70 to 75%. The left ventricle has hyperdynamic function. The left ventricle has no regional wall motion abnormalities. The left ventricular internal cavity size was normal in size. There is moderate concentric left ventricular hypertrophy. Left ventricular diastolic parameters are indeterminate. Right Ventricle: Hypokinesis of the mid-apical right ventricle with normal systolic function at the base. This pattern is opposite of McConnell's sign, which can be seen in acute pulmonary embolism. The right ventricular size is normal. No increase in right ventricular wall thickness. Right ventricular systolic function is moderately reduced. There is severely elevated  pulmonary artery systolic pressure. The tricuspid regurgitant velocity is 3.84 m/s, and with an assumed right atrial pressure of 8 mmHg, the estimated right ventricular systolic pressure is 18.2 mmHg. Left [facility]: Left atrial size was normal in size. Right [facility]: Right atrial size was normal in size. Pericardium: There is no evidence of pericardial effusion. Mitral Valve: Systolic anterior motion (SAM) of the mitral valve. The mitral valve is normal in structure. No evidence of mitral valve regurgitation. No evidence of mitral valve stenosis. Tricuspid Valve: The tricuspid valve is normal in structure. Tricuspid valve regurgitation is moderate to severe. No evidence of tricuspid stenosis. Aortic Valve: The aortic valve is tricuspid. Aortic valve regurgitation is not visualized. No aortic stenosis is present. Pulmonic Valve: The pulmonic valve was normal in structure. Pulmonic valve regurgitation is trivial. No evidence of pulmonic stenosis. Aorta: The aortic root is normal in size and structure. Venous: IVC assessment for right atrial pressure unable to be performed due to mechanical ventilation. The inferior vena cava is normal in size with less than 50% respiratory variability, suggesting right atrial pressure of 8 mmHg. IAS/Shunts: No atrial level shunt detected by color flow Doppler.  LEFT VENTRICLE PLAX 2D LVIDd:         3.20 cm LVIDs:         2.10 cm LV PW:         1.43 cm LV IVS:        1.50 cm LVOT diam:     1.70 cm LVOT Area:     2.27 cm  RIGHT VENTRICLE          IVC RV Basal diam:  4.00 cm  IVC diam: 1.90 cm RV Mid diam:    4.00 cm LEFT ATRIUM         Index LA diam:    3.60 cm 1.95 cm/m   AORTA Ao Root diam: 3.30 cm Ao Asc diam:  2.90 cm TRICUSPID VALVE TR Peak grad:   59.0 mmHg TR Vmax:        384.00 cm/s  SHUNTS Systemic Diam: 1.70 cm Skeet Latch MD Electronically signed by Skeet Latch MD Signature Date/Time: 10/28/2021/3:55:58 PM    Final    US Abdomen Limited RUQ (LIVER/GB)  Result Date:  10/28/2021 CLINICAL DATA:  Abnormal CT, evaluate gallbladder. EXAM: ULTRASOUND ABDOMEN LIMITED RIGHT UPPER QUADRANT COMPARISON:  CT abdomen and pelvis 10/28/2021. FINDINGS: Gallbladder: No gallstones are identified. There is gallbladder wall thickening measuring up to 5.6 mm. Pericholecystic fluid is present. No sonographic Murphy sign noted by sonographer. Common bile duct: Diameter: 4.2 mm. Liver: There is a complex hypoechoic area measuring 1.7 x 1.4 x 1.3 cm in the left lobe of the liver. This measures smaller than lesion on  CT and remains indeterminate. Within normal limits in parenchymal echogenicity. Portal vein is patent on color Doppler imaging with normal direction of blood flow towards the liver. Other: Trace ascites. IMPRESSION: 1. Gallbladder wall thickening and pericholecystic fluid concerning for acute or chronic cholecystitis. No gallstones or biliary ductal dilatation. 2. Indeterminate left hepatic lesion. Recommend further evaluation with MRI. 3. Trace ascites. Electronically Signed   By: Ronney Asters M.D.   On: 10/28/2021 21:53    Labs:  CBC: Recent Labs    10/30/21 1757 10/30/21 2141 10/31/21 1000 10/31/21 1300 10/31/21 1700 10/31/21 1932 10/31/21 2153 10/31/21 2154 10/31/21 2325  WBC 24.6*  --  18.6* 17.9*  --  18.6*  --   --   --   HGB 6.9*   < > 10.0* 9.2*  --  8.5*  --  6.8* 7.4*  HCT 18.9*   < > 26.8* 25.9*  --  24.3*  --  20.0* 21.0*  PLT 101*   < > 65* 62* 59* 60* 62*  --   --    < > = values in this interval not displayed.    COAGS: Recent Labs    10/31/21 0153 10/31/21 0531 10/31/21 1700 10/31/21 2153  INR 1.6* 1.4* 1.3* 1.3*  APTT 71* 44* 43* 38*    BMP: Recent Labs    10/31/21 0026 10/31/21 0034 10/31/21 0531 10/31/21 1247 10/31/21 1300 10/31/21 2154  NA 135   < > 135 134* 134* 135  K 5.8*   < > 5.5* 5.6* 6.2* 5.3*  CL 99  --  98 99 98  --   CO2 18*  --  21* 21* 20*  --   GLUCOSE 144*  --  128* 134* 144*  --   BUN 50*  --  51* 51* 56*  --    CALCIUM 6.6*  --  7.4* 6.9* 6.8*  --   CREATININE 5.39*  --  5.50* 5.32* 5.93*  --   GFRNONAA 8*  --  8* 8* 7*  --    < > = values in this interval not displayed.    LIVER FUNCTION TESTS: Recent Labs    10/29/21 0503 10/30/21 0400 10/31/21 0531 10/31/21 1247  BILITOT 1.2   1.3* 1.4* 1.6*  --   AST 2,275*   2,252* 2,025* 711*  --   ALT 1,210*   1,233* 1,540* 713*  --   ALKPHOS 86   86 92 91  --   PROT 4.5*   4.8* 3.9* 3.7*  --   ALBUMIN 2.1*   2.1* 1.7* 1.7* 1.7*    TUMOR MARKERS: No results for input(s): AFPTM, CEA, CA199, CHROMGRNA in the last 8760 hours.  Assessment and Plan:  71 year old woman with worsening anemia, hypotension, and tachycardia due to large intraperitoneal hemorrhage most likely originating from the left liver lobe.  IR consulted for left hepatic embolization.  I believe she is an appropriate candidate for this procedure given her worsening status despite aggressive medical management.    I discussed the case with her daughters [name] and [name].  Risks and benefits of hepatic angiogram and embolization were discussed with the patient's daughters including, but not limited to bleeding, infection, vascular injury, hepatic infarct, liver failure or contrast induced renal failure.  All of the patient's daughters questions were answered, patient is agreeable to proceed.  Consent signed and in chart.  Thank you for this interesting consult.  I greatly enjoyed meeting PERLA ECHAVARRIA and look forward to  participating in their care.  A copy of this report was sent to the requesting provider on this date.  Electronically Signed: Paula Libra Nasean Zapf, MD 11/01/2021, 2:12 AM   I spent a total of 20 Minutes  in face to face in clinical consultation, greater than 50% of which was counseling/coordinating care for acute hepatic hemorrhage.

## 2021-11-01 NOTE — Procedures (Addendum)
Patient Name: DEYANNA MCTIER  MRN: [id number]  Epilepsy Attending: Lora Havens  Referring Physician/Provider: Donita Brooks, NP Duration: 10/31/2021 1920 to 11/01/2021 1233   Patient history: 71yo F s/p cardiac arrest. EEG to evaluate for seizure   Level of alertness: lethargic   AEDs during EEG study: None   Technical aspects: This EEG study was done with scalp electrodes positioned according to the 10-20 International system of electrode placement. Electrical activity was acquired at a sampling rate of 500Hz  and reviewed with a high frequency filter of 70Hz  and a low frequency filter of 1Hz . EEG data were recorded continuously and digitally stored.    Description: EEG showed continuous generalized low amplitude 3 to 6 Hz theta-delta slowing, at times with triphasic. Hyperventilation and photic stimulation were not performed.       ABNORMALITY - Continuous slow, generalized   IMPRESSION: This study is suggestive of severe diffuse encephalopathy, nonspecific etiology. No seizures or epileptiform discharges were seen throughout the recording.   Leiyah Maultsby Barbra Sarks

## 2021-11-01 NOTE — Procedures (Signed)
Interventional Radiology Procedure Note  Procedure: Hepatic angiogram and gelfoam embolization of left hepatic lobe  Indication: Acute hemorrhage from left hepatic lobe  Findings: Please refer to procedural dictation for full description.  Complications: None  EBL: < 10 mL  Miachel Roux, MD [phone]

## 2021-11-01 NOTE — Progress Notes (Signed)
LTM D/C. Patient had no skin breakdown. [facility] notified.

## 2021-11-01 NOTE — Progress Notes (Signed)
[facility] Progress Note   71 y.o. female HTN HLD  DM asthma fibromyalgia with a presyncopal episode at home but then lost consciousness before EMS arrived.  VF arrest and had 20 minutes of CPR + coded twice in the ED for 4 and 2 minutes. She required multiple pushes of epinephrine for bradycardia and hypotension. Found to have saddle embolus in LLL with 13m Omnipaque on 1/1 and also had another 1037mof Ominipaque on 1/1 for a CT abdomen pelvis. Patient also treated with Vancomycin + vasopressin + epinephrine + Levophed. Patient underwent mechanical thrombectomy but later had a drop in Hb to 5 with a large peritoneal and parenchymal hemorrhage requiring  5U PRBC, FFP and platelets. She has CKD with slowly rising creatining since 2018 with BL cr in the 1.17-1.3 range.  Assessment/ Plan:   Renal failure secondary to ATN in the setting of cardiac arrest, hypotension, pressors, contrast. She is certainly moving towards RRT. BL CKD3A with BL creatinine in the 1.17-1.3 range in late 2021. - Appreciate CCM placing trialysis catheter in the left femoral vein.  Started on CRRT 10/31/2020 400/200/1500 2K baths 50-10041met UF per hour  Will continue 2K baths for now pending repeat chemistry panel later today; K already dropped from 5.6 -> 5.4 and was off CRRT overnight for VIR study.  -Monitor Daily I/Os, Daily weight  -Maintain MAP>65 for optimal renal perfusion. Still on high dose Levophed but Epinephrine being weaned down. -Avoid nephrotoxic medications including NSAIDs - Dose medications for GFR <15 and adjust when she's on CRRT   PE s/p mechanical thrombectomy complicated by large bleed. Anemia s/p transfusions in DIC s/p Vitamin K, platelets and FFP + PRBC. DM HTN Respiratory failure on nebulizers, ventilator   Subjective:   Large drop in Hb overnight requiring more blood products  + hepatic angiogram and gelfoam embolization of the left hepatic lobe by  VIR overnight.    Objective:   BP 139/65    Pulse (!) 105    Temp (!) 97.5 F (36.4 C)    Resp (!) 5    Ht _0  (1.651 m)    Wt 93.6 kg    SpO2 (!) 87%    BMI 34.34 kg/m   Intake/Output Summary (Last 24 hours) at 11/01/2021 0946 Last data filed at 11/01/2021 0900 Gross per 24 hour  Intake 4532.83 ml  Output 488 ml  Net 4044.83 ml   Weight change:   Physical Exam: GEN: on Vent, spontaneous eye opening but not following commands HEENT:  conjunctival pallor NECK: Supple, no thyromegaly LUNGS: CTA on vent CV: Tachy ABD: SNDNT no BS  EXT: 1+  lower extremity edema ACCESS: lt fem vasc cath   Imaging: CT ABDOMEN PELVIS WO CONTRAST  Result Date: 10/30/2021 CLINICAL DATA:  Retroperitoneal hemorrhage. EXAM: CT ABDOMEN AND PELVIS WITHOUT CONTRAST TECHNIQUE: Multidetector CT imaging of the abdomen and pelvis was performed following the standard protocol without IV contrast. COMPARISON:  October 28, 2021. FINDINGS: Lower chest: Large bilateral lower lobe airspace opacities are noted concerning for pneumonia with small adjacent pleural effusions. Hepatobiliary: Contrast is noted in the gallbladder. No biliary dilatation is noted. There appears to be hemorrhage within the left hepatic lobe which extends into large adjacent left subscapular hematoma or perihepatic hematoma. High-density fluid is noted around the liver concerning for hemorrhage. Pancreas: Unremarkable. No pancreatic ductal dilatation or surrounding inflammatory changes. Spleen: Normal in size without focal abnormality. Adrenals/Urinary Tract: Adrenal glands and kidneys are unremarkable. No hydronephrosis or  renal obstruction is noted. Urinary bladder is decompressed secondary to Foley catheter. Stomach/Bowel: Nasogastric tube is seen looped within proximal stomach. The appendix appears normal. There is no evidence of bowel obstruction or inflammation. Vascular/Lymphatic: Aortic atherosclerosis. No enlarged abdominal or pelvic lymph nodes. Reproductive:  Uterus and bilateral adnexa are unremarkable. Other: There is interval development of a large amount of high density fluid in the pelvis consistent with intraperitoneal hemorrhage. It is seen in bilateral pericolic gutters and noted around the spleen and pancreas. Musculoskeletal: No acute or significant osseous findings. IMPRESSION: There is interval development of large intraperitoneal hemorrhage seen in the pelvis which extends into both pericolic gutters and around the liver and spleen. Also noted is interval development intraparenchymal hemorrhage within the left hepatic lobe which extends into large left subcapsular hematoma. Large bilateral lower lobe airspace opacities are noted most consistent with pneumonia with small adjacent pleural effusions. Critical Value/emergent results were called by telephone at the time of interpretation on 10/30/2021 at 9:20 pm to provider Dr. Lucile Shutters, who verbally acknowledged these results. Aortic Atherosclerosis (ICD10-I70.0). Electronically Signed   By: Marijo Conception M.D.   On: 10/30/2021 21:21   DG Chest 1 View  Result Date: 11/01/2021 CLINICAL DATA:  CPR.  [employer]. EXAM: CHEST  1 VIEW COMPARISON:  10/29/2021 FINDINGS: 0740 hours. Endotracheal tube tip is approximately 2.7 cm above the base of the carina. NG tube tip overlies the gastric fundus. Right IJ central line tip is positioned in the low right atrium. Low lung volumes with bibasilar atelectasis and left-sided pleural effusion. Telemetry leads overlie the chest. IMPRESSION: 1. Endotracheal tube tip is approximately 2.7 cm above the base of the carina. 2. Low volume film with bibasilar atelectasis and left pleural effusion. Electronically Signed   By: Misty Stanley M.D.   On: 11/01/2021 07:55    Labs: BMET Recent Labs  Lab 10/29/21 0503 10/29/21 0510 10/29/21 1949 10/30/21 0400 10/30/21 0803 10/30/21 1357 10/30/21 1723 10/31/21 0026 10/31/21 0034 10/31/21 0531 10/31/21 1247 10/31/21 1300  10/31/21 1332 10/31/21 2154 11/01/21 0415 11/01/21 0707  NA 141   < > 140 138   < >  --    < > 135 135 135 134* 134*  --  135 137   136 135  K 3.1*   < > 3.7 4.4   < > 5.8*   < > 5.8* 5.7* 5.5* 5.6* 6.2*  --  5.3* 5.6*   5.6* 5.4*  CL 103   < > 103 99  --   --   --  99  --  98 99 98  --   --  102   101  --   CO2 17*   < > 24 25  --   --   --  18*  --  21* 21* 20*  --   --  17*   16*  --   GLUCOSE 545*   < > 91 111*  --   --   --  144*  --  128* 134* 144*  --   --  90   91  --   BUN 35*   < > 41* 41*  --   --   --  50*  --  51* 51* 56*  --   --  42*   40*  --   CREATININE 3.52*   < > 4.15* 4.75*  --   --   --  5.39*  --  5.50* 5.32* 5.93*  --   --  4.44*   4.44*  --   CALCIUM 8.0*   < > 7.2* 6.5*  --   --   --  6.6*  --  7.4* 6.9* 6.8*  --   --  7.1*   7.0*  --   PHOS 3.6  --   --  2.3*  --  2.5  --   --   --  6.3* 5.4*  --  5.8*  --  6.6*  --    < > = values in this interval not displayed.   CBC Recent Labs  Lab 10/30/21 0100 10/30/21 0400 10/30/21 0803 10/30/21 0938 10/30/21 1356 10/30/21 1357 10/30/21 1721 10/31/21 1300 10/31/21 1700 10/31/21 1932 10/31/21 2153 10/31/21 2154 10/31/21 2325 11/01/21 0415 11/01/21 0707 11/01/21 0751  WBC 19.4* 21.4*  --  20.2*  --  21.2*   < > 17.9*  --  18.6*  --   --   --  17.6*  --  16.1*  NEUTROABS 16.5* 18.2*  --  17.3*  --  17.9*  --   --   --   --   --   --   --   --   --   --   HGB 10.5* 9.8*   < > 9.5*   < > 8.3*   < > 9.2*  --  8.5*  --    < > 7.4* 4.6* 8.5* 9.2*  HCT 29.3* 28.9*   < > 27.0*   < > 22.4*   < > 25.9*  --  24.3*  --    < > 21.0* 13.9* 25.0* 27.1*  MCV 77.9* 78.5*  --  78.5*  --  77.8*   < > 79.4*  --  80.2  --   --   --  84.2  --  87.1  PLT 101* 101*   102*  --  92*  --  100*   < > 62*   < > 60* 62*  --   --  158   156  --  130*   < > = values in this interval not displayed.    Medications:     sodium chloride   Intravenous Once   sodium chloride   Intravenous Once   B-complex with vitamin C  1 tablet Per Tube  Daily   chlorhexidine gluconate (MEDLINE KIT)  15 mL Mouth Rinse BID   Chlorhexidine Gluconate Cloth  6 each Topical Daily   docusate  100 mg Per Tube BID   fentaNYL (SUBLIMAZE) injection  25 mcg Intravenous Once   gelatin adsorbable       hydrocortisone sod succinate (SOLU-CORTEF) inj  100 mg Intravenous BID   lidocaine       mouth rinse  15 mL Mouth Rinse 10 times per day   pantoprazole (PROTONIX) IV  40 mg Intravenous Q12H   polyethylene glycol  17 g Per Tube Daily   sodium chloride flush  10-40 mL Intracatheter Q12H   sodium chloride flush  10-40 mL Intracatheter Q12H   vancomycin variable dose per unstable renal function (pharmacist dosing)   Does not apply See admin instructions      Otelia Santee, MD 11/01/2021, 9:46 AM

## 2021-11-01 NOTE — Progress Notes (Signed)
Referring Physician(s): Dr. Tamala Julian   Supervising Physician: Markus Daft  Patient Status:  [facility] - In-pt  Chief Complaint: Saddle pulmonary embolism s/p catheter-directed thrombectomy in IR 10/29/21 and left hepatic lobe embolization 11/01/21  Subjective: Patient remains intubated/sedated with CRRT in progress. Both daughters are at the bedside. Patient opens her eyes intermittently.   Allergies: Aspirin, Benadryl [diphenhydramine], Darvon [propoxyphene], Hydrocodone bit-homatrop mbr, Metformin, Other, Pentazocine, Percocet [oxycodone-acetaminophen], Sulfa antibiotics, Tetracaine hcl, Tetracyclines & related, and Tramadol  Medications: Prior to Admission medications   Medication Sig Start Date End Date Taking? Authorizing Provider  acetaminophen (TYLENOL) 500 MG tablet Take 500-1,000 mg by mouth every 6 (six) hours as needed for mild pain or headache.    [provider]  albuterol (ACCUNEB) 1.25 MG/3ML nebulizer solution Take 1.25 mg by nebulization every 8 (eight) hours as needed for wheezing or shortness of breath. 07/04/21   [provider]  albuterol (PROVENTIL HFA;VENTOLIN HFA) 108 (90 BASE) MCG/ACT inhaler Inhale 1-2 puffs into the lungs every 4 (four) hours as needed for wheezing or shortness of breath.     [provider]  ALPRAZolam Duanne Moron) 0.25 MG tablet Take 0.25 mg by mouth daily as needed for anxiety. 02/19/19   [provider]  baclofen (LIORESAL) 10 MG tablet TAKE 1 TABLET(10 MG) BY MOUTH TWICE DAILY AS NEEDED FOR MUSCLE SPASMS Patient taking differently: Take 10 mg by mouth 2 (two) times daily as needed for muscle spasms. 06/15/21   Bo Merino, MD  betamethasone dipropionate (DIPROLENE) 0.05 % ointment 1 application to affected area 03/15/15   [provider]  diclofenac Sodium (VOLTAREN) 1 % GEL Apply 4 g topically daily as needed (pain).    [provider]  diphenhydrAMINE HCl (BENADRYL ALLERGY PO)     [provider]  Elastic Bandages & Supports (MEDICAL COMPRESSION STOCKINGS) [place] 1 each by Does not apply route every 6 (six) hours as needed (swelling). 04/28/19   Wieters, Hallie C, PA-C  fluticasone (FLONASE) 50 MCG/ACT nasal spray Place 2 sprays into both nostrils daily. 08/07/10   [provider]  gabapentin (NEURONTIN) 300 MG capsule Take 300 mg by mouth 2 (two) times daily.    [provider]  hydrochlorothiazide (HYDRODIURIL) 12.5 MG tablet Take 12.5 mg by mouth daily. 02/10/19   [provider]  LANTUS SOLOSTAR 100 UNIT/ML Solostar Pen Inject 22 Units into the skin at bedtime. 12/09/18   [provider]  pantoprazole (PROTONIX) 40 MG tablet Take 40 mg by mouth 2 (two) times daily.    [provider]  repaglinide (PRANDIN) 2 MG tablet Take 2 mg by mouth 2 (two) times daily before a meal.    [provider]  simvastatin (ZOCOR) 40 MG tablet Take 40 mg by mouth daily.    [provider]  temazepam (RESTORIL) 15 MG capsule TAKE 1 CAPSULE(15 MG) BY MOUTH AT BEDTIME AS NEEDED Patient taking differently: Take 15 mg by mouth at bedtime as needed for sleep. 10/15/21   Ofilia Neas, PA-C  Vitamin D, Ergocalciferol, (DRISDOL) 1.25 MG (50000 UNIT) CAPS capsule Take 50,000 Units by mouth once a week. 10/08/21   [provider]     Vital Signs: BP (!) 129/59    Pulse 87    Temp (!) 97.5 F (36.4 C)    Resp (!) 22    Ht 5\' 5"  (1.651 m)    Wt 206 lb 5.6 oz (93.6 kg)    SpO2 93%    BMI  34.34 kg/m   Physical Exam Constitutional:      Comments: Intubated/sedated; opens eyes intermittently.   Cardiovascular:     Comments: CRRT in progress. Right groin vascular site is clean, soft and dry. Mild bruising noted around the dressing. Dressing is clean/dry and intact.  Pulmonary:     Comments: Ventilator Skin:    General: Skin is warm and dry.    Imaging: CT ABDOMEN PELVIS WO CONTRAST  Result Date: 10/30/2021 CLINICAL DATA:   Retroperitoneal hemorrhage. EXAM: CT ABDOMEN AND PELVIS WITHOUT CONTRAST TECHNIQUE: Multidetector CT imaging of the abdomen and pelvis was performed following the standard protocol without IV contrast. COMPARISON:  October 28, 2021. FINDINGS: Lower chest: Large bilateral lower lobe airspace opacities are noted concerning for pneumonia with small adjacent pleural effusions. Hepatobiliary: Contrast is noted in the gallbladder. No biliary dilatation is noted. There appears to be hemorrhage within the left hepatic lobe which extends into large adjacent left subscapular hematoma or perihepatic hematoma. High-density fluid is noted around the liver concerning for hemorrhage. Pancreas: Unremarkable. No pancreatic ductal dilatation or surrounding inflammatory changes. Spleen: Normal in size without focal abnormality. Adrenals/Urinary Tract: Adrenal glands and kidneys are unremarkable. No hydronephrosis or renal obstruction is noted. Urinary bladder is decompressed secondary to Foley catheter. Stomach/Bowel: Nasogastric tube is seen looped within proximal stomach. The appendix appears normal. There is no evidence of bowel obstruction or inflammation. Vascular/Lymphatic: Aortic atherosclerosis. No enlarged abdominal or pelvic lymph nodes. Reproductive: Uterus and bilateral adnexa are unremarkable. Other: There is interval development of a large amount of high density fluid in the pelvis consistent with intraperitoneal hemorrhage. It is seen in bilateral pericolic gutters and noted around the spleen and pancreas. Musculoskeletal: No acute or significant osseous findings. IMPRESSION: There is interval development of large intraperitoneal hemorrhage seen in the pelvis which extends into both pericolic gutters and around the liver and spleen. Also noted is interval development intraparenchymal hemorrhage within the left hepatic lobe which extends into large left subcapsular hematoma. Large bilateral lower lobe airspace opacities  are noted most consistent with pneumonia with small adjacent pleural effusions. Critical Value/emergent results were called by telephone at the time of interpretation on 10/30/2021 at 9:20 pm to provider Dr. Lucile Shutters, who verbally acknowledged these results. Aortic Atherosclerosis (ICD10-I70.0). Electronically Signed   By: Marijo Conception M.D.   On: 10/30/2021 21:21   DG Chest 1 View  Result Date: 11/01/2021 CLINICAL DATA:  CPR.  Innovation. EXAM: CHEST  1 VIEW COMPARISON:  10/29/2021 FINDINGS: 0740 hours. Endotracheal tube tip is approximately 2.7 cm above the base of the carina. NG tube tip overlies the gastric fundus. Right IJ central line tip is positioned in the low right atrium. Low lung volumes with bibasilar atelectasis and left-sided pleural effusion. Telemetry leads overlie the chest. IMPRESSION: 1. Endotracheal tube tip is approximately 2.7 cm above the base of the carina. 2. Low volume film with bibasilar atelectasis and left pleural effusion. Electronically Signed   By: Misty Stanley M.D.   On: 11/01/2021 07:55   CT HEAD WO CONTRAST (5MM)  Result Date: 10/28/2021 CLINICAL DATA:  Mental status change, unknown cause EXAM: CT HEAD WITHOUT CONTRAST TECHNIQUE: Contiguous axial images were obtained from the base of the skull through the vertex without intravenous contrast. COMPARISON:  None. FINDINGS: Brain: No evidence of large-territorial acute infarction. No parenchymal hemorrhage. No mass lesion. No extra-axial collection. No mass effect or midline shift. No hydrocephalus. Basilar cisterns are patent. Vascular: No hyperdense vessel. Skull: No acute fracture or  focal lesion. Sinuses/Orbits: Mucosal thickening within bilateral nasal cavities. Paranasal sinuses and mastoid air cells are clear. The orbits are unremarkable. Other: None. IMPRESSION: No acute intracranial abnormality. Electronically Signed   By: Iven Finn M.D.   On: 10/28/2021 17:31   CT Angio Chest Pulmonary Embolism (PE) W or WO  Contrast  Result Date: 10/28/2021 CLINICAL DATA:  Pulmonary embolism (PE) suspected, high prob echo with RV strain EXAM: CT ANGIOGRAPHY CHEST WITH CONTRAST TECHNIQUE: Multidetector CT imaging of the chest was performed using the standard protocol during bolus administration of intravenous contrast. Multiplanar CT image reconstructions and MIPs were obtained to evaluate the vascular anatomy. CONTRAST:  6mL OMNIPAQUE IOHEXOL 350 MG/ML SOLN COMPARISON:  None. FINDINGS: Lines and tubes: Right mainstem bronchus intubation with tip terminating just distal to the carina. Enteric tube coursing below the hemidiaphragm with tip within the gastric lumen. There is a 5.2 cm density within the right lower neck that is partially visualized that may be related to the insertion site of a right internal jugular central venous catheter with tip terminating in the right atrium. Cardiovascular: Satisfactory opacification of the pulmonary arteries to the segmental level. Saddle pulmonary embolus extending to the segmental and subsegmental levels in the left lower lobe in all three right lobes. Associated increased right to left ventricular ratio 1.9. No significant pericardial effusion. The thoracic aorta is normal in caliber. No atherosclerotic plaque of the thoracic aorta. No coronary artery calcifications. Mediastinum/Nodes: No definite pneumomediastinum. No enlarged mediastinal, hilar, or axillary lymph nodes. Thyroid gland, trachea, and esophagus demonstrate no significant findings. Lungs/Pleura: Bilateral lower lobe subsegmental atelectasis. Lingular subsegmental atelectasis. No pulmonary nodule. No pulmonary mass. No pleural effusion. No pneumothorax. Upper Abdomen: Query partially visualized right upper mid abdomen fat stranding. Musculoskeletal: No suspicious lytic or blastic osseous lesions. Acute minimally displaced left anterior fourth and fifth rib fractures. Acute nondisplaced mid sternal body fracture. Likely old  nondisplaced left posterior rib fractures. Multilevel degenerative changes of the spine. Review of the MIP images confirms the above findings. IMPRESSION: 1. Saddle pulmonary embolus extending to the segmental and subsegmental levels in the left lower lobe and in all three right lobes. Associated right heart strain. No definite pulmonary infarction. Positive for acute PE with CT evidence of right heart strain (RV/LV Ratio = 1.9) consistent with at least submassive (intermediate risk) PE. The presence of right heart strain has been associated with an increased risk of morbidity and mortality. Please refer to the "PE Focused" order set in EPIC. 2. Right mainstem bronchus intubation with tip terminating just distal to the carina. Recommend retracting by 3 cm. 3. A 5.2 cm density within the right lower neck that is partially visualized that may be related to a hematoma along the insertion site of a right internal jugular central venous catheter with tip terminating in the right atrium. Limited evaluation due to timing of contrast. 4. Acute minimally displaced left anterior 4th and 5th rib fractures. 5.  Acute nondisplaced mid sternal body fracture. 6. Query partially visualized right upper mid abdomen fat stranding. Concern for underlying mass or inflammation. Recommend CT abdomen pelvis with contrast for further evaluation. 7. Bilateral lower lobe and lingular consolidation may represent atelectasis with superimposed infection/inflammation (such as aspiration pneumonia) not excluded. These results were called by telephone at the time of interpretation on 10/28/2021 at 5:17 pm to provider Dr. Ernest Mallick , who verbally acknowledged these results. Electronically Signed   By: Iven Finn M.D.   On: 10/28/2021 17:30   CT  ABDOMEN PELVIS W CONTRAST  Result Date: 10/28/2021 CLINICAL DATA:  Anemia. EXAM: CT ABDOMEN AND PELVIS WITH CONTRAST TECHNIQUE: Multidetector CT imaging of the abdomen and pelvis was performed using  the standard protocol following bolus administration of intravenous contrast. CONTRAST:  120mL OMNIPAQUE IOHEXOL 300 MG/ML  SOLN COMPARISON:  CT scan of same day. FINDINGS: Lower chest: Left upper lobe atelectasis or pneumonia is noted. Mild posterior basilar subsegmental atelectasis is noted. Pulmonary embolus is noted in right pulmonary artery as noted on prior CT scan of same day. Hepatobiliary: No gallstones or biliary dilatation is noted. Large ill-defined low density is noted in left hepatic lobe measuring 3.7 x 2.4 cm. Mild amount of fluid is noted in the gallbladder fossa and in Morison's pouch concerning for possible cholecystitis. Pancreas: Unremarkable. No pancreatic ductal dilatation or surrounding inflammatory changes. Spleen: Normal in size without focal abnormality. Adrenals/Urinary Tract: Adrenal glands and kidneys appear normal. Hydronephrosis or renal obstruction is noted. Urinary bladder is decompressed secondary to Foley catheter. Stomach/Bowel: Stomach and appendix are unremarkable. There is no evidence of bowel obstruction. Colon is unremarkable. Fluid-filled small bowel loops are noted with minimal wall and fold thickening and enhancement suggesting mild enteritis. Vascular/Lymphatic: Aortic atherosclerosis. No enlarged abdominal or pelvic lymph nodes. Reproductive: Uterus and bilateral adnexa are unremarkable. Other: No abdominal wall hernia or abnormality. No abdominopelvic ascites. Musculoskeletal: No acute or significant osseous findings. IMPRESSION: Mild amount of fluid is noted in the gallbladder fossa and in Morrison's pouch which may represent cholecystitis. No cholelithiasis is noted. Ultrasound is recommended for further evaluation. Ill-defined 3.7 x 2.4 cm low density is noted in the left hepatic lobe of uncertain etiology. When the patient is clinically stable and able to follow directions and hold their breath (preferably as an outpatient) further evaluation with dedicated  abdominal MRI should be considered. Fluid-filled small bowel loops are noted with minimal wall and fold thickening and enhancement suggesting enteritis. Aortic Atherosclerosis (ICD10-I70.0). Electronically Signed   By: Marijo Conception M.D.   On: 10/28/2021 18:46   IR THROMBECT PRIM MECH INIT (INCLU) MOD SED  Result Date: 10/29/2021 INDICATION: High-risk pulmonary embolism with hemodynamic instability requiring pressor support, bilateral pulmonary embolism with saddle component EXAM: 1. Ultrasound-guided puncture of the right common femoral vein 2. Catheterization of the main and right pulmonary arteries with bilateral pulmonary artery angiogram 3. Mechanical/aspiration thrombectomy of the right and left pulmonary arteries COMPARISON:  CTA chest previous day MEDICATIONS: None. ANESTHESIA/SEDATION: Sedation provided by the critical care team. Refer to separate critical care records. FLUOROSCOPY TIME:  FLUOROSCOPY TIME Fluoroscopy Time: 12.3 minutes with 4 exposures COMPLICATIONS: None immediate. TECHNIQUE: Informed written consent was obtained from the patient after a thorough discussion of the procedural risks, benefits and alternatives. All questions were addressed. Maximal Sterile Barrier Technique was utilized including caps, mask, sterile gowns, sterile gloves, sterile drape, hand hygiene and skin antiseptic. A timeout was performed prior to the initiation of the procedure. The patient was placed supine on the exam table. Ultrasound of the right groin was performed. This demonstrated a widely patent right common femoral vein. A permanent image was stored in the electronic medical record. Using ultrasound guidance, the right common femoral vein was directly punctured with a 21 gauge micropuncture set with visualization of needle entry into the vessel lumen. Wire was advanced centrally into the IVC, followed by serial tract dilation to accommodate a short 6 [place] sheath. Initial attempts to catheterize the main  pulmonary artery using combination of an Omni Flush catheter  and Rosen guidewire were unsuccessful. The sheath was exchanged for a long 7 [place] sheath, and the pulmonary artery was catheterized using a combination of a single angled pigtail catheter and Rosen guidewire. Wire and catheter were directed into the right main pulmonary artery. Wire was exchanged for a short tapered stiff Amplatz wire. Over this wire, the access site was serially dilated to accommodate a 24 [place] sheath. Next, the Inari 24 [place] FlowTriever aspiration thrombectomy catheter was then advanced into the distal aspect of the right main pulmonary artery. Aspiration thrombectomy of the right main pulmonary artery was performed. Large volume of acute appearing clot was removed. The aspiration thrombectomy catheter was slowly withdrawn into the main pulmonary artery/RVOT, and thrombectomy of the saddle component and left pulmonary artery were performed, with removal of additional acute appearing thrombus. With the aspiration thrombectomy catheter positioned within the main pulmonary artery, bilateral pulmonary artery angiogram was performed. Bilateral pulmonary artery angiogram demonstrates complete resolution of the saddle pulmonary embolism in clot burden within the main pulmonary arteries. There remains scattered residual small volume subsegmental embolism. The patient's clinical parameters had improved with reduction in vasopressor requirement and improvement in blood pressure at the end of the procedure. Given this improvement and no further large volume embolism identified on completion angiography, the procedure was stopped at this point. All catheters and wires were removed. Hemostasis was achieved at the access site using a pursestring suture. A clean dressing was placed. The patient tolerated the procedure well without immediate complication. FINDINGS: As above. IMPRESSION: Successful mechanical/aspiration thrombectomy of high-risk  bilateral pulmonary embolism using the Inari FlowTriever aspiration thrombectomy catheter. Completion angiography demonstrated near-complete resolution of the saddle component and clot burden within the main pulmonary arteries. Electronically Signed   By: Albin Felling M.D.   On: 10/29/2021 16:33   IR US Guide Vasc Access Right  Result Date: 10/29/2021 INDICATION: High-risk pulmonary embolism with hemodynamic instability requiring pressor support, bilateral pulmonary embolism with saddle component EXAM: 1. Ultrasound-guided puncture of the right common femoral vein 2. Catheterization of the main and right pulmonary arteries with bilateral pulmonary artery angiogram 3. Mechanical/aspiration thrombectomy of the right and left pulmonary arteries COMPARISON:  CTA chest previous day MEDICATIONS: None. ANESTHESIA/SEDATION: Sedation provided by the critical care team. Refer to separate critical care records. FLUOROSCOPY TIME:  FLUOROSCOPY TIME Fluoroscopy Time: 12.3 minutes with 4 exposures COMPLICATIONS: None immediate. TECHNIQUE: Informed written consent was obtained from the patient after a thorough discussion of the procedural risks, benefits and alternatives. All questions were addressed. Maximal Sterile Barrier Technique was utilized including caps, mask, sterile gowns, sterile gloves, sterile drape, hand hygiene and skin antiseptic. A timeout was performed prior to the initiation of the procedure. The patient was placed supine on the exam table. Ultrasound of the right groin was performed. This demonstrated a widely patent right common femoral vein. A permanent image was stored in the electronic medical record. Using ultrasound guidance, the right common femoral vein was directly punctured with a 21 gauge micropuncture set with visualization of needle entry into the vessel lumen. Wire was advanced centrally into the IVC, followed by serial tract dilation to accommodate a short 6 [place] sheath. Initial attempts  to catheterize the main pulmonary artery using combination of an Omni Flush catheter and Rosen guidewire were unsuccessful. The sheath was exchanged for a long 7 [place] sheath, and the pulmonary artery was catheterized using a combination of a single angled pigtail catheter and Rosen guidewire. Wire and catheter were directed into the right  main pulmonary artery. Wire was exchanged for a short tapered stiff Amplatz wire. Over this wire, the access site was serially dilated to accommodate a 24 [place] sheath. Next, the Inari 24 [place] FlowTriever aspiration thrombectomy catheter was then advanced into the distal aspect of the right main pulmonary artery. Aspiration thrombectomy of the right main pulmonary artery was performed. Large volume of acute appearing clot was removed. The aspiration thrombectomy catheter was slowly withdrawn into the main pulmonary artery/RVOT, and thrombectomy of the saddle component and left pulmonary artery were performed, with removal of additional acute appearing thrombus. With the aspiration thrombectomy catheter positioned within the main pulmonary artery, bilateral pulmonary artery angiogram was performed. Bilateral pulmonary artery angiogram demonstrates complete resolution of the saddle pulmonary embolism in clot burden within the main pulmonary arteries. There remains scattered residual small volume subsegmental embolism. The patient's clinical parameters had improved with reduction in vasopressor requirement and improvement in blood pressure at the end of the procedure. Given this improvement and no further large volume embolism identified on completion angiography, the procedure was stopped at this point. All catheters and wires were removed. Hemostasis was achieved at the access site using a pursestring suture. A clean dressing was placed. The patient tolerated the procedure well without immediate complication. FINDINGS: As above. IMPRESSION: Successful mechanical/aspiration  thrombectomy of high-risk bilateral pulmonary embolism using the Inari FlowTriever aspiration thrombectomy catheter. Completion angiography demonstrated near-complete resolution of the saddle component and clot burden within the main pulmonary arteries. Electronically Signed   By: Albin Felling M.D.   On: 10/29/2021 16:33   DG Chest Port 1 View  Result Date: 10/29/2021 CLINICAL DATA:  Respiratory failure. EXAM: PORTABLE CHEST 1 VIEW COMPARISON:  10/28/2021. FINDINGS: ET tube tip is above the carina. Nasogastric tube tip and side port are in the gastric fundus. Right IJ catheter tip is in the right atrium. Stable cardiomediastinal contours. Asymmetric opacification within the left midlung and left lower lung appears increased from the previous exam. Right lung remains clear. IMPRESSION: 1. Worsening aeration to the left midlung and left lower lung. 2. Stable support apparatus. Electronically Signed   By: Kerby Moors M.D.   On: 10/29/2021 07:07   DG CHEST PORT 1 VIEW  Result Date: 10/28/2021 CLINICAL DATA:  Post CPR.  Central line placement. EXAM: PORTABLE CHEST 1 VIEW COMPARISON:  10/28/2021 at 1:15 p.m. FINDINGS: Right internal jugular central venous line has its tip in the right atrium. No pneumothorax. No other change from the earlier study. IMPRESSION: 1. Right IJ central venous line has its tip in the right atrium. No pneumothorax. Electronically Signed   By: Lajean Manes M.D.   On: 10/28/2021 14:52   EEG adult  Result Date: 10/28/2021 Lora Havens, MD     10/28/2021  9:19 PM Patient Name: [name] MRN: [id number] Epilepsy Attending: Lora Havens Referring Physician/Provider: Donita Brooks, NP Date: 10/28/2021 Duration: 21.01 mins Patient history: 71yo F s/p cardiac arrest. EEG to evaluate for seizure Level of alertness: comatos AEDs during EEG study: None Technical aspects: This EEG study was done with scalp electrodes positioned according to the 10-20 International system of  electrode placement. Electrical activity was acquired at a sampling rate of 500Hz  and reviewed with a high frequency filter of 70Hz  and a low frequency filter of 1Hz . EEG data were recorded continuously and digitally stored. Description: EEG showed near continuous generalized low amplitude 3 to 6 Hz theta-delta slowing admixed with 2-4 seconds of eeg attenuation. There was also  15-18hz  frontocentral beta activity intermittently. Hyperventilation and photic stimulation were not performed.   ABNORMALITY - Continuous slow, generalized - Background attenuation, generalized IMPRESSION: This study is suggestive of severe to profound diffuse encephalopathy, nonspecific etiology. No seizures or epileptiform discharges were seen throughout the recording. Priyanka Barbra Sarks   Overnight EEG with video  Result Date: 10/29/2021 Lora Havens, MD     10/30/2021  8:50 AM Patient Name: [name] MRN: [id number] Epilepsy Attending: Lora Havens Referring Physician/Provider: Donita Brooks, NP Duration: 10/28/2021 1920 to 10/29/2021 1920  Patient history: 71yo F s/p cardiac arrest. EEG to evaluate for seizure  Level of alertness: lethargic  AEDs during EEG study: None  Technical aspects: This EEG study was done with scalp electrodes positioned according to the 10-20 International system of electrode placement. Electrical activity was acquired at a sampling rate of 500Hz  and reviewed with a high frequency filter of 70Hz  and a low frequency filter of 1Hz . EEG data were recorded continuously and digitally stored.  Description: EEG showed near continuous generalized low amplitude 3 to 6 Hz theta-delta slowing admixed with 2-4 seconds of eeg attenuation. There was also 15-18hz  frontocentral beta activity intermittently. Hyperventilation and photic stimulation were not performed.   Patient event button was pressed on 10/29/2021 at 1057 and 1213 for shaking while being repositioned.  Concomitant EEG before, during and after the event  did not show any EEG changes suggest seizure.  ABNORMALITY - Continuous slow, generalized - Background attenuation, generalized  IMPRESSION: This study is suggestive of severe to profound diffuse encephalopathy, nonspecific etiology. No seizures or epileptiform discharges were seen throughout the recording. Patient event button was pressed on 10/29/2021 at 1057 and 1213 for shaking while being repositioned without concomitant EEG change.  These episodes were not epileptic.  Lora Havens   ECHOCARDIOGRAM COMPLETE  Result Date: 10/28/2021    ECHOCARDIOGRAM REPORT   Patient Name:   [name] Date of Exam: 10/28/2021 Medical Rec #:  [id number]       Height:       65.0 in Accession #:    [id number]      Weight:       170.0 lb Date of Birth:  July 23, 1951       BSA:          1.846 m Patient Age:    48 years        BP:           110/69 mmHg Patient Gender: F               HR:           104 bpm. Exam Location:  Inpatient Procedure: 2D Echo, Color Doppler and Limited Color Doppler STAT ECHO Indications:    Cardiac arrest  History:        Patient has prior history of Echocardiogram examinations.                 Hemoptysis. Respiratory failure requiring mechanical                 ventilation.  Sonographer:    Merrie Roof RDCS Referring Phys: [id number] [facility]  Sonographer Comments: Suboptimal apical window, echo performed with patient supine and on artificial respirator and Technically difficult study due to poor echo windows. IMPRESSIONS  1. Right ventricular function is abnormal. In the setting of cardiac arrest, would rule out pulmonary embolism if clinically indicated.  2. Left ventricular ejection fraction, by estimation,  is 70 to 75%. The left ventricle has hyperdynamic function. The left ventricle has no regional wall motion abnormalities. There is moderate concentric left ventricular hypertrophy. Left ventricular diastolic parameters are indeterminate.  3. Hypokinesis of the mid-apical right ventricle with  normal systolic function at the base. This pattern is opposite of McConnell's sign, which can be seen in acute pulmonary embolism. Right ventricular systolic function is moderately reduced. The right  ventricular size is normal. There is severely elevated pulmonary artery systolic pressure.  4. Systolic anterior motion (SAM) of the mitral valve. The mitral valve is normal in structure. No evidence of mitral valve regurgitation. No evidence of mitral stenosis.  5. Tricuspid valve regurgitation is moderate to severe.  6. The aortic valve is tricuspid. Aortic valve regurgitation is not visualized. No aortic stenosis is present.  7. The inferior vena cava is normal in size with <50% respiratory variability, suggesting right atrial pressure of 8 mmHg. FINDINGS  Left Ventricle: Left ventricular ejection fraction, by estimation, is 70 to 75%. The left ventricle has hyperdynamic function. The left ventricle has no regional wall motion abnormalities. The left ventricular internal cavity size was normal in size. There is moderate concentric left ventricular hypertrophy. Left ventricular diastolic parameters are indeterminate. Right Ventricle: Hypokinesis of the mid-apical right ventricle with normal systolic function at the base. This pattern is opposite of McConnell's sign, which can be seen in acute pulmonary embolism. The right ventricular size is normal. No increase in right ventricular wall thickness. Right ventricular systolic function is moderately reduced. There is severely elevated pulmonary artery systolic pressure. The tricuspid regurgitant velocity is 3.84 m/s, and with an assumed right atrial pressure of 8 mmHg, the estimated right ventricular systolic pressure is 14.4 mmHg. Left Atrium: Left atrial size was normal in size. Right Atrium: Right atrial size was normal in size. Pericardium: There is no evidence of pericardial effusion. Mitral Valve: Systolic anterior motion (SAM) of the mitral valve. The mitral  valve is normal in structure. No evidence of mitral valve regurgitation. No evidence of mitral valve stenosis. Tricuspid Valve: The tricuspid valve is normal in structure. Tricuspid valve regurgitation is moderate to severe. No evidence of tricuspid stenosis. Aortic Valve: The aortic valve is tricuspid. Aortic valve regurgitation is not visualized. No aortic stenosis is present. Pulmonic Valve: The pulmonic valve was normal in structure. Pulmonic valve regurgitation is trivial. No evidence of pulmonic stenosis. Aorta: The aortic root is normal in size and structure. Venous: IVC assessment for right atrial pressure unable to be performed due to mechanical ventilation. The inferior vena cava is normal in size with less than 50% respiratory variability, suggesting right atrial pressure of 8 mmHg. IAS/Shunts: No atrial level shunt detected by color flow Doppler.  LEFT VENTRICLE PLAX 2D LVIDd:         3.20 cm LVIDs:         2.10 cm LV PW:         1.43 cm LV IVS:        1.50 cm LVOT diam:     1.70 cm LVOT Area:     2.27 cm  RIGHT VENTRICLE          IVC RV Basal diam:  4.00 cm  IVC diam: 1.90 cm RV Mid diam:    4.00 cm LEFT ATRIUM         Index LA diam:    3.60 cm 1.95 cm/m   AORTA Ao Root diam: 3.30 cm Ao Asc diam:  2.90 cm TRICUSPID  VALVE TR Peak grad:   59.0 mmHg TR Vmax:        384.00 cm/s  SHUNTS Systemic Diam: 1.70 cm Skeet Latch MD Electronically signed by Skeet Latch MD Signature Date/Time: 10/28/2021/3:55:58 PM    Final    US Abdomen Limited RUQ (LIVER/GB)  Result Date: 10/28/2021 CLINICAL DATA:  Abnormal CT, evaluate gallbladder. EXAM: ULTRASOUND ABDOMEN LIMITED RIGHT UPPER QUADRANT COMPARISON:  CT abdomen and pelvis 10/28/2021. FINDINGS: Gallbladder: No gallstones are identified. There is gallbladder wall thickening measuring up to 5.6 mm. Pericholecystic fluid is present. No sonographic Murphy sign noted by sonographer. Common bile duct: Diameter: 4.2 mm. Liver: There is a complex hypoechoic area  measuring 1.7 x 1.4 x 1.3 cm in the left lobe of the liver. This measures smaller than lesion on CT and remains indeterminate. Within normal limits in parenchymal echogenicity. Portal vein is patent on color Doppler imaging with normal direction of blood flow towards the liver. Other: Trace ascites. IMPRESSION: 1. Gallbladder wall thickening and pericholecystic fluid concerning for acute or chronic cholecystitis. No gallstones or biliary ductal dilatation. 2. Indeterminate left hepatic lesion. Recommend further evaluation with MRI. 3. Trace ascites. Electronically Signed   By: Ronney Asters M.D.   On: 10/28/2021 21:53    Labs:  CBC: Recent Labs    10/31/21 1300 10/31/21 1700 10/31/21 1932 10/31/21 2153 10/31/21 2154 10/31/21 2325 11/01/21 0415 11/01/21 0707 11/01/21 0751  WBC 17.9*  --  18.6*  --   --   --  17.6*  --  16.1*  HGB 9.2*  --  8.5*  --    < > 7.4* 4.6* 8.5* 9.2*  HCT 25.9*  --  24.3*  --    < > 21.0* 13.9* 25.0* 27.1*  PLT 62*   < > 60* 62*  --   --  158   156  --  130*   < > = values in this interval not displayed.    COAGS: Recent Labs    10/31/21 0531 10/31/21 1700 10/31/21 2153 11/01/21 0415  INR 1.4* 1.3* 1.3* 1.4*  APTT 44* 43* 38* 33    BMP: Recent Labs    10/31/21 0531 10/31/21 1247 10/31/21 1300 10/31/21 2154 11/01/21 0415 11/01/21 0707  NA 135 134* 134* 135 137   136 135  K 5.5* 5.6* 6.2* 5.3* 5.6*   5.6* 5.4*  CL 98 99 98  --  102   101  --   CO2 21* 21* 20*  --  17*   16*  --   GLUCOSE 128* 134* 144*  --  90   91  --   BUN 51* 51* 56*  --  42*   40*  --   CALCIUM 7.4* 6.9* 6.8*  --  7.1*   7.0*  --   CREATININE 5.50* 5.32* 5.93*  --  4.44*   4.44*  --   GFRNONAA 8* 8* 7*  --  10*   10*  --     LIVER FUNCTION TESTS: Recent Labs    10/29/21 0503 10/30/21 0400 10/31/21 0531 10/31/21 1247 11/01/21 0415  BILITOT 1.2   1.3* 1.4* 1.6*  --  1.5*  AST 2,275*   2,252* 2,025* 711*  --  338*  ALT 1,210*   1,233* 1,540* 713*  --  452*   ALKPHOS 86   86 92 91  --  102  PROT 4.5*   4.8* 3.9* 3.7*  --  4.3*  ALBUMIN 2.1*   2.1* 1.7* 1.7*  1.7* 1.9*   1.9*    Assessment and Plan:  Acute hemorrhage from left hepatic lobe s/p hepatic angiogram and gelfoam embolization of the left hepatic lobe  Hemoglobin 9.2 this morning; pressor requirement is decreased compared to pre-embolization doses. Right groin vascular site is clean, soft and dry with mild bruising. Ok to remove the dressing tomorrow.   IR remains available as needed.   Electronically Signed: Soyla Dryer, AGACNP-BC [phone] 11/01/2021, 1:53 PM   I spent a total of 15 Minutes at the the patient's bedside AND on the patient's hospital floor or unit, greater than 50% of which was counseling/coordinating care for hepatic angiogram with gelfoam embolization of left hepatic lobe

## 2021-11-01 NOTE — Progress Notes (Addendum)
eLink Physician-Brief Progress Note Patient Name: [name] DOB: 1951-05-15 MRN: [id number]   Date of Service  11/01/2021  HPI/Events of Note  Patient continues to bleed. Hgb = 9.2 --> 8.5 --> 7.4. Vasopressor requirements have increased. Patient evaluated by surgery last evening and was not felt to be a surgical candidate.   eICU Interventions  Plan: IR consulted for possible embolism of hepatic lesion. I have spoken to Dr. Dwaine Gale from IR and he will see the patient in consultation.      Intervention Category Major Interventions: Other:;Hypotension - evaluation and management  Deepika Decatur Cornelia Copa 11/01/2021, 12:11 AM

## 2021-11-01 NOTE — Progress Notes (Signed)
NAME:  [name], MRN:  [id number], DOB:  04-May-1951, LOS: 4 ADMISSION DATE:  10/28/2021, CONSULTATION DATE: 10/28/21 REFERRING MD: Dr. Sherry Ruffing, CHIEF COMPLAINT:  Cardiac Arrest, VF   History of Present Illness:  [name] presenting with prolonged OOH VF arrest secondary to massive pulmonary embolus complicated by DIC.  Pertinent  Medical History  Anxiety / Depression  Fibromyalgia  Asthma  DM  GERD  HTN  HLD   Significant Hospital Events: Including procedures, antibiotic start and stop dates in addition to other pertinent events   1/1 Admit post VF arrest  1/2 S/p mechanical thrombectomy of right and left PA 1/3 Weaned to low dose epi and levo. In the evening increased pressor requirement and Hg drop to ~5. Found with large intraperitoneal/intraparenchymal hemorrhage extending into left subcapsular hematoma. Received PRBC x 5, FFP x 5 and Plt x 1 1/4 Overnight worsening pressor requirement and Hg ~4. Transfused PRBC x 3, FFP x 2 and platelet x 2. S/p embolization of left hepatic lobe  Interim History / Subjective:  Overnight worsening pressor requirement and Hg ~4. Transfused PRBC x 3, FFP x 2 and platelet x 2. S/p embolization of left hepatic lobe  Objective   Blood pressure (!) 154/67, pulse (!) 104, temperature (!) 97.5 F (36.4 C), resp. rate (!) 22, height 5\' 5"  (1.651 m), weight 93.6 kg, SpO2 95 %. CVP:  [9 mmHg-27 mmHg] 27 mmHg  Vent Mode: PRVC FiO2 (%):  [40 %-100 %] 40 % Set Rate:  [22 bmp] 22 bmp Vt Set:  [490 mL-500 mL] 490 mL PEEP:  [5 cmH20] 5 cmH20 Plateau Pressure:  [25 cmH20-31 cmH20] 31 cmH20   Intake/Output Summary (Last 24 hours) at 11/01/2021 0809 Last data filed at 11/01/2021 0650 Gross per 24 hour  Intake 4289.71 ml  Output 380 ml  Net 3909.71 ml   Filed Weights   10/28/21 1900 10/29/21 0700 10/31/21 0500  Weight: 77.1 kg 81.1 kg 93.6 kg   Physical Exam: General: Critically ill-appearing, sedated HENT: [place], AT, ETT in place Eyes: EOMI, no  scleral icterus Respiratory: Anterior rhonchi bilaterally. No wheezing Cardiovascular: RRR, -M/R/G, no JVD GI: Hypoactive, soft, nontender Extremities:-Edema,-tenderness Neuro: Opens eyes to touch, 36mm PERRL  K 5.4  LFTs improving  Bcx 1/3 NGTD Trac asp 1/3 Yeast  CT A/P large intraperitoneal/intraparenchymal hemorrhage extending into left subcapsular hematoma.   Imaging, labs and test noted above have been reviewed independently by me.  Resolved Hospital Problem list     Assessment & Plan:  Acute metabolic encephalopathy in setting of arrest Concerned for anoxic brain injury - Will evaluate with MRI when stable - Continue LTM. No seizures - PAD protocol: Precedex and Fentanyl for RASS goal -1  OOH prolonged Vfib Arrest with multiorgan failure Secondary to unprovoked massive pulmonary embolism with DIC ongoing severe bleeding limiting treatment options.  S/p thrombectomy with improving hemodynamics.  Given prolonged OOH arrest, poor neurological exam I do not think she is a good ECMO candidate. - Telemetry - Normothermia - Heparin gtt C/I in setting of bleed  Obstructive shock secondary to PE Complicated by hemorraghic shock 1/3, s/p 5 U PRBC, FFP x 5 and Plt x 1 Increased pressor requirement in the last 24 hours - Wean vasopressors: epi, levo and vasopressin for MAP goal >65 - Stress dose steroids - Trend LA - Trend CBC q6h  Acute hypoxemic respiratory failure secondary to cardiac arrest, aspiration pneumonia - Continue neb: Brovana and Pulmicort - Vanc and Cefepime 1/1> -  De-escalate abx pending culture data from 1/3 - Full vent support - Daily SBT/WUA when qualified - PAD protocol: Fentanyl and Propofol  Retroperitoneal bleed/Hemorrhagic shock/Acute blood loss anemia originating from left liver lobe s/p embolization 1/4 DIC  S/p IR embolization of left hepatic lobe  - Not a surgical candidate - Trend DIC panel, CBC - Transfuse for active bleeding or goal  >7  Acute renal failure, related to low perfusion during arrest - worsening. Currently anuric S/p lokelma - Appreciate Nephrology - PRN ABG - Trend UOP/Cr - Avoid nephrotoxic agents  DM II with severe hyperglycemia r/o DKA Anion gap acidosis - improving - CBG - DC insulin gtt - SSI - Start trickle feeds  Hypocalcemia Hypomag - Repleted  HTN, HLD, GERD, Anxiety, Depression, Fibromyalgia  Liver lesions NOS- MRI if recovers  GOC Prognosis grim in setting of DIC, PE and inability to anticoagulate in setting of hemoperitoneum and multi-organ failure. Source of bleed may be related to liver lesion concerning for malignancy however unable to confirm in acute illness. In addition to multiorgan failure, her neurological status post-arrest remains unclear and if poor, efforts may be futile. Family remains hopeful and wish to pursue current aggressive measures including dialysis.  Again readdressed GOC with family at bedside this morning given hemorrhagic shock. At this point no further interventions can be offered if she continues to bleed. We can medically manage however if she is unresponsive to transfusions, recommend transitioning to comfort care.  Best Practice (right click and "Reselect all SmartList Selections" daily)  Diet/type: tubefeeds DVT prophylaxis: SCD GI prophylaxis: PPI Lines: Central line Foley:  N/A Code Status:  full code Last date of multidisciplinary goals of care discussion: 1/1, continue aggressive care for now   The patient is critically ill with multiple organ systems failure and requires high complexity decision making for assessment and support, frequent evaluation and titration of therapies, application of advanced monitoring technologies and extensive interpretation of multiple databases.  Independent Critical Care Time: 60 Minutes.   Rodman Pickle, M.D. [facility] Pulmonary/Critical Care Medicine 11/01/2021 8:09 AM   Please see Amion for pager number to  reach on-call Pulmonary and Critical Care Team.

## 2021-11-02 ENCOUNTER — Inpatient Hospital Stay (HOSPITAL_COMMUNITY): Payer: Medicare PPO

## 2021-11-02 ENCOUNTER — Other Ambulatory Visit: Payer: Self-pay | Admitting: Rheumatology

## 2021-11-02 DIAGNOSIS — I2699 Other pulmonary embolism without acute cor pulmonale: Secondary | ICD-10-CM

## 2021-11-02 DIAGNOSIS — I469 Cardiac arrest, cause unspecified: Secondary | ICD-10-CM | POA: Diagnosis not present

## 2021-11-02 LAB — CBC
HCT: 22.8 % — ABNORMAL LOW (ref 36.0–46.0)
HCT: 25.1 % — ABNORMAL LOW (ref 36.0–46.0)
HCT: 24.7 % — ABNORMAL LOW (ref 36.0–46.0)
Hemoglobin: 8.2 g/dL — ABNORMAL LOW (ref 12.0–15.0)
Hemoglobin: 8.6 g/dL — ABNORMAL LOW (ref 12.0–15.0)
Hemoglobin: 8.1 g/dL — ABNORMAL LOW (ref 12.0–15.0)
MCH: 30.4 pg (ref 26.0–34.0)
MCH: 29.5 pg (ref 26.0–34.0)
MCH: 28.7 pg (ref 26.0–34.0)
MCHC: 36 g/dL (ref 30.0–36.0)
MCHC: 34.3 g/dL (ref 30.0–36.0)
MCHC: 32.8 g/dL (ref 30.0–36.0)
MCV: 84.4 fL (ref 80.0–100.0)
MCV: 86 fL (ref 80.0–100.0)
MCV: 87.6 fL (ref 80.0–100.0)
Platelets: 85 10*3/uL — ABNORMAL LOW (ref 150–400)
Platelets: UNDETERMINED 10*3/uL (ref 150–400)
Platelets: UNDETERMINED 10*3/uL (ref 150–400)
RBC: 2.7 MIL/uL — ABNORMAL LOW (ref 3.87–5.11)
RBC: 2.92 MIL/uL — ABNORMAL LOW (ref 3.87–5.11)
RBC: 2.82 MIL/uL — ABNORMAL LOW (ref 3.87–5.11)
RDW: 16.3 % — ABNORMAL HIGH (ref 11.5–15.5)
RDW: 16.5 % — ABNORMAL HIGH (ref 11.5–15.5)
RDW: 16.8 % — ABNORMAL HIGH (ref 11.5–15.5)
WBC: 16.4 10*3/uL — ABNORMAL HIGH (ref 4.0–10.5)
WBC: 15.8 10*3/uL — ABNORMAL HIGH (ref 4.0–10.5)
WBC: 16 10*3/uL — ABNORMAL HIGH (ref 4.0–10.5)
nRBC: 0.2 % (ref 0.0–0.2)
nRBC: 0.5 % — ABNORMAL HIGH (ref 0.0–0.2)
nRBC: 0.6 % — ABNORMAL HIGH (ref 0.0–0.2)

## 2021-11-02 LAB — RENAL FUNCTION PANEL
Albumin: 2 g/dL — ABNORMAL LOW (ref 3.5–5.0)
Albumin: 1.9 g/dL — ABNORMAL LOW (ref 3.5–5.0)
Anion gap: 12 (ref 5–15)
Anion gap: 14 (ref 5–15)
BUN: 32 mg/dL — ABNORMAL HIGH (ref 8–23)
BUN: 35 mg/dL — ABNORMAL HIGH (ref 8–23)
CO2: 21 mmol/L — ABNORMAL LOW (ref 22–32)
CO2: 18 mmol/L — ABNORMAL LOW (ref 22–32)
Calcium: 7.1 mg/dL — ABNORMAL LOW (ref 8.9–10.3)
Calcium: 7.4 mg/dL — ABNORMAL LOW (ref 8.9–10.3)
Chloride: 102 mmol/L (ref 98–111)
Chloride: 100 mmol/L (ref 98–111)
Creatinine, Ser: 3 mg/dL — ABNORMAL HIGH (ref 0.44–1.00)
Creatinine, Ser: 2.87 mg/dL — ABNORMAL HIGH (ref 0.44–1.00)
GFR, Estimated: 16 mL/min — ABNORMAL LOW (ref >60)
GFR, Estimated: 17 mL/min — ABNORMAL LOW (ref >60)
Glucose, Bld: 158 mg/dL — ABNORMAL HIGH (ref 70–99)
Glucose, Bld: 245 mg/dL — ABNORMAL HIGH (ref 70–99)
Phosphorus: 4.5 mg/dL (ref 2.5–4.6)
Phosphorus: 5 mg/dL — ABNORMAL HIGH (ref 2.5–4.6)
Potassium: 4.6 mmol/L (ref 3.5–5.1)
Potassium: 4.4 mmol/L (ref 3.5–5.1)
Sodium: 135 mmol/L (ref 135–145)
Sodium: 132 mmol/L — ABNORMAL LOW (ref 135–145)

## 2021-11-02 LAB — BPAM RBC
Blood Product Expiration Date: 202301212359
Blood Product Expiration Date: 202301222359
Blood Product Expiration Date: 202301222359
ISSUE DATE / TIME: 202301050542
ISSUE DATE / TIME: 202301050542
ISSUE DATE / TIME: 202301050542
Unit Type and Rh: 6200
Unit Type and Rh: 6200
Unit Type and Rh: 6200

## 2021-11-02 LAB — POCT I-STAT 7, (LYTES, BLD GAS, ICA,H+H)
Acid-base deficit: 2 mmol/L (ref 0.0–2.0)
Bicarbonate: 21.6 mmol/L (ref 20.0–28.0)
Calcium, Ion: 0.99 mmol/L — ABNORMAL LOW (ref 1.15–1.40)
HCT: 24 % — ABNORMAL LOW (ref 36.0–46.0)
Hemoglobin: 8.2 g/dL — ABNORMAL LOW (ref 12.0–15.0)
O2 Saturation: 95 %
Patient temperature: 98.3
Potassium: 4.5 mmol/L (ref 3.5–5.1)
Sodium: 135 mmol/L (ref 135–145)
TCO2: 23 mmol/L (ref 22–32)
pCO2 arterial: 30.5 mmHg — ABNORMAL LOW (ref 32.0–48.0)
pH, Arterial: 7.458 — ABNORMAL HIGH (ref 7.350–7.450)
pO2, Arterial: 69 mmHg — ABNORMAL LOW (ref 83.0–108.0)

## 2021-11-02 LAB — PREPARE PLATELET PHERESIS
Unit division: 0
Unit division: 0

## 2021-11-02 LAB — BPAM PLATELET PHERESIS
Blood Product Expiration Date: 202301062359
Blood Product Expiration Date: 202301062359
ISSUE DATE / TIME: 202301042331
ISSUE DATE / TIME: 202301050303
Unit Type and Rh: 6200
Unit Type and Rh: 6200

## 2021-11-02 LAB — TYPE AND SCREEN
ABO/RH(D): A POS
Antibody Screen: NEGATIVE
Unit division: 0
Unit division: 0
Unit division: 0

## 2021-11-02 LAB — DIC (DISSEMINATED INTRAVASCULAR COAGULATION)PANEL
D-Dimer, Quant: 16.36 ug/mL-FEU — ABNORMAL HIGH (ref 0.00–0.50)
D-Dimer, Quant: >20 ug/mL-FEU — ABNORMAL HIGH (ref 0.00–0.50)
Fibrinogen: 543 mg/dL — ABNORMAL HIGH (ref 210–475)
Fibrinogen: 542 mg/dL — ABNORMAL HIGH (ref 210–475)
INR: 1.3 — ABNORMAL HIGH (ref 0.8–1.2)
INR: 1.2 (ref 0.8–1.2)
Platelets: 85 10*3/uL — ABNORMAL LOW (ref 150–400)
Platelets: 70 10*3/uL — ABNORMAL LOW (ref 150–400)
Prothrombin Time: 15.8 seconds — ABNORMAL HIGH (ref 11.4–15.2)
Prothrombin Time: 15.6 seconds — ABNORMAL HIGH (ref 11.4–15.2)
Smear Review: NONE SEEN
Smear Review: NONE SEEN
aPTT: 30 seconds (ref 24–36)
aPTT: 27 seconds (ref 24–36)

## 2021-11-02 LAB — CALCIUM, IONIZED: Calcium, Ionized, Serum: 3.3 mg/dL — ABNORMAL LOW (ref 4.5–5.6)

## 2021-11-02 LAB — HEPATIC FUNCTION PANEL
ALT: 670 U/L — ABNORMAL HIGH (ref 0–44)
AST: 728 U/L — ABNORMAL HIGH (ref 15–41)
Albumin: 2 g/dL — ABNORMAL LOW (ref 3.5–5.0)
Alkaline Phosphatase: 151 U/L — ABNORMAL HIGH (ref 38–126)
Bilirubin, Direct: 0.8 mg/dL — ABNORMAL HIGH (ref 0.0–0.2)
Indirect Bilirubin: 1.2 mg/dL — ABNORMAL HIGH (ref 0.3–0.9)
Total Bilirubin: 2 mg/dL — ABNORMAL HIGH (ref 0.3–1.2)
Total Protein: 4.8 g/dL — ABNORMAL LOW (ref 6.5–8.1)

## 2021-11-02 LAB — GLUCOSE, CAPILLARY
Glucose-Capillary: 156 mg/dL — ABNORMAL HIGH (ref 70–99)
Glucose-Capillary: 163 mg/dL — ABNORMAL HIGH (ref 70–99)
Glucose-Capillary: 182 mg/dL — ABNORMAL HIGH (ref 70–99)
Glucose-Capillary: 224 mg/dL — ABNORMAL HIGH (ref 70–99)
Glucose-Capillary: 228 mg/dL — ABNORMAL HIGH (ref 70–99)
Glucose-Capillary: 72 mg/dL (ref 70–99)
Glucose-Capillary: >600 mg/dL (ref 70–99)

## 2021-11-02 LAB — LACTIC ACID, PLASMA
Lactic Acid, Venous: 1.9 mmol/L (ref 0.5–1.9)
Lactic Acid, Venous: 2 mmol/L (ref 0.5–1.9)
Lactic Acid, Venous: 1.8 mmol/L (ref 0.5–1.9)

## 2021-11-02 LAB — MAGNESIUM: Magnesium: 2.4 mg/dL (ref 1.7–2.4)

## 2021-11-02 MED ORDER — PRISMASOL BGK 4/2.5 32-4-2.5 MEQ/L REPLACEMENT SOLN: Status: DC

## 2021-11-02 MED ORDER — QUETIAPINE FUMARATE 50 MG PO TABS
50.0000 mg | ORAL_TABLET | Freq: Every day | ORAL | Status: DC
  Administered 2021-11-02: 50 mg via ORAL
  Filled 2021-11-02: qty 1

## 2021-11-02 MED ORDER — VITAL AF 1.2 CAL PO LIQD: 1000.0000 mL | ORAL | Status: DC

## 2021-11-02 MED ORDER — QUETIAPINE FUMARATE 50 MG PO TABS
50.0000 mg | ORAL_TABLET | Freq: Every day | ORAL | Status: DC
  Administered 2021-11-03 – 2021-11-05 (×3): 50 mg
  Filled 2021-11-02 (×3): qty 1

## 2021-11-02 MED ORDER — CALCIUM GLUCONATE-NACL 2-0.675 GM/100ML-% IV SOLN
2.0000 g | Freq: Once | INTRAVENOUS | Status: AC
  Administered 2021-11-02: 2000 mg via INTRAVENOUS
  Filled 2021-11-02: qty 100

## 2021-11-02 MED ORDER — VITAL AF 1.2 CAL PO LIQD
1000.0000 mL | ORAL | Status: DC
  Administered 2021-11-02: 1000 mL

## 2021-11-02 MED ORDER — INSULIN ASPART 100 UNIT/ML IJ SOLN
1.0000 [IU] | INTRAMUSCULAR | Status: DC
  Administered 2021-11-02 – 2021-11-03 (×5): 3 [IU] via SUBCUTANEOUS
  Administered 2021-11-03: 2 [IU] via SUBCUTANEOUS
  Administered 2021-11-03: 3 [IU] via SUBCUTANEOUS
  Administered 2021-11-03: 2 [IU] via SUBCUTANEOUS
  Administered 2021-11-04: 1 [IU] via SUBCUTANEOUS
  Administered 2021-11-04: 2 [IU] via SUBCUTANEOUS
  Administered 2021-11-04 (×2): 1 [IU] via SUBCUTANEOUS
  Administered 2021-11-04: 2 [IU] via SUBCUTANEOUS
  Administered 2021-11-04: 1 [IU] via SUBCUTANEOUS
  Administered 2021-11-05 (×4): 2 [IU] via SUBCUTANEOUS
  Administered 2021-11-06 (×5): 3 [IU] via SUBCUTANEOUS

## 2021-11-02 MED ORDER — PRISMASOL BGK 4/2.5 32-4-2.5 MEQ/L REPLACEMENT SOLN
Status: DC
  Administered 2021-11-05: 1 via INTRAVENOUS_CENTRAL

## 2021-11-02 NOTE — Progress Notes (Signed)
Nutrition Follow-up  DOCUMENTATION CODES:   Not applicable  INTERVENTION:   Tube Feeding via OG: Titrate Vital AF 1.2 to 30 ml/hr with plan to titrate by 10 mL q 8 hours until goal rate of 65 ml/hr Goal rate provides 108 g of protein, 1728 kcals, 1166 mL of free water  Discussed nutrition poc with Dr. Lynetta Mare. If pt does not tolerate advancement of TF, plan for trial of Reglan and post-pyloric placement when able. If continues to have issues with TF tolerance, plan for TPN  Continue B-complex with C  NUTRITION DIAGNOSIS:   Inadequate oral intake related to acute illness as evidenced by NPO status.  Being addressed  GOAL:   Patient will meet greater than or equal to 90% of their needs  Not Met- attempting to progress  MONITOR:   Vent status, Labs, Weight trends, TF tolerance  REASON FOR ASSESSMENT:   Ventilator    ASSESSMENT:   71 yo female admitted with acute encephalopathy post OOH prolonged V.fib arrest with multiorgan failure with massive saddle PE and DIC, AKI. PMH includes DM, HTN, HLD, GERD, anxiety, depression, fibromyalgia  01/01 Admitted, VF arrest, Intubated, Concern for anoxic injury 1/02 Saddle PE s/p Mechanical thrombectomy 1/03 Hemodynamics improved post thrombectomy, weaning pressors, Hgb table, bleeding reduced, started trickle TF. Later in day, pressor requirement increased with Hgb drop to around 5 and found to have large intraperitoneal/intraparenchymal hemorrhage extending into left subscapular hematoma. Pt received PRBC x 5, FFP x 5, Platelets x 1 1/05 acute hemorrhage from left hepatic lob s/p Left hepatic lobe embolization, TF resumed at trickle  Pt remains on CRRT Pt remains on vent support; pressor requirements improved;  vasopressin (0.04 units/min), epinephrine (2 mcg/min) Propofol: OFF  Vital AF 1.2 continues at 20 ml/hr this AM, no vomiting. Hgb stable.  NPO/Trickle TF only since admission (5 days)  No BM since 11/03 RN reports  residuals of 300 to 400 mL but no vomiting, abdomen is soft. Practice of checking residuals is not evidenced  Discussed with Dr. Ashley Mariner. Plan to attempt to advance TF to goal. If unable to tolerante, will plan for post -pyloric tube with trial of reglan. If pt still unable to tolerate, then will plan for TPN  Labs: reviewed Meds: miralax, colace   Diet Order:   Diet Order             Diet NPO time specified  Diet effective now                   EDUCATION NEEDS:   Not appropriate for education at this time  Skin:  Skin Assessment: Reviewed RN Assessment  Last BM:  1/3  Height:   Ht Readings from Last 1 Encounters:  10/28/21 5' 5"  (1.651 m)    Weight:   Wt Readings from Last 1 Encounters:  11/02/21 99.4 kg     BMI:  Body mass index is 36.47 kg/m.  Estimated Nutritional Needs:   Kcal:  3818-2993 kcals  Protein:  85-115 g  Fluid:  >/= 1.8 L  Kerman Passey MS, RDN, LDN, CNSC Registered Dietitian III Clinical Nutrition RD Pager and On-Call Pager Number Located in [place]

## 2021-11-02 NOTE — Progress Notes (Addendum)
NAME:  [name], MRN:  [id number], DOB:  02/05/1951, LOS: 5 ADMISSION DATE:  10/28/2021, CONSULTATION DATE: 10/28/21 REFERRING MD: Dr. Sherry Ruffing, CHIEF COMPLAINT:  Cardiac Arrest, VF   History of Present Illness:  [name] presenting with prolonged OOH VF arrest secondary to massive pulmonary embolus complicated by DIC.  Pertinent  Medical History  Anxiety / Depression  Fibromyalgia  Asthma  DM  GERD  HTN  HLD   Significant Hospital Events: Including procedures, antibiotic start and stop dates in addition to other pertinent events   1/1 Admit post VF arrest  1/2 S/p mechanical thrombectomy of right and left PA 1/3 Weaned to low dose epi and levo. In the evening increased pressor requirement and Hg drop to ~5. Found with large intraperitoneal/intraparenchymal hemorrhage extending into left subcapsular hematoma. Received PRBC x 5, FFP x 5 and Plt x 1 1/4 Overnight worsening pressor requirement and Hg ~4. Transfused PRBC x 3, FFP x 2 and platelet x 2. S/p embolization of left hepatic lobe Improving vasopressor requirements.  Interim History / Subjective:  Improved blood pressure.  Patient at times follows commands particularly in the evening for family members.  Objective   Blood pressure 138/69, pulse 100, temperature 98.3 F (36.8 C), temperature source Axillary, resp. rate 17, height 5\' 5"  (1.651 m), weight 99.4 kg, SpO2 92 %. CVP:  [10 mmHg-22 mmHg] 10 mmHg  Vent Mode: PRVC FiO2 (%):  [45 %] 45 % Set Rate:  [22 bmp] 22 bmp Vt Set:  [490 mL] 490 mL PEEP:  [5 cmH20] 5 cmH20 Plateau Pressure:  [23 cmH20-31 cmH20] 31 cmH20   Intake/Output Summary (Last 24 hours) at 11/02/2021 1205 Last data filed at 11/02/2021 0800 Gross per 24 hour  Intake 1616.3 ml  Output 2700 ml  Net -1083.7 ml    Filed Weights   10/29/21 0700 10/31/21 0500 11/02/21 0500  Weight: 81.1 kg 93.6 kg 99.4 kg   Physical Exam: General: Critically ill-appearing, now off sedation HENT: [place], AT, ETT in  place Eyes: EOMI, no scleral icterus Respiratory: Clear bilaterally with no dyssynchrony. Cardiovascular: RRR, -M/R/G, no JVD GI: Hypoactive, soft, nontender Extremities:-Edema,-tenderness Neuro: Opens eyes to voice.  Does not predictably squeeze fingers.   Ancillary tests personally reviewed:  ABG consistent with hypoxic respiratory failure Creatinine is improving to 3.00 on CRRT  Assessment & Plan:  Acute metabolic encephalopathy in setting of arrest OOH prolonged Vfib Arrest with multiorgan failure Secondary to unprovoked massive pulmonary embolism with DIC  Obstructive shock secondary to PE Complicated by hemorraghic shock 1/3, s/p 5 U PRBC, FFP x 5 and Plt x 1 Acute hypoxemic respiratory failure secondary to cardiac arrest, aspiration pneumonia Retroperitoneal bleed/Hemorrhagic shock/Acute blood loss anemia originating from left liver lobe s/p embolization 1/4 DIC  Acute renal failure, related to low perfusion during arrest - DM II with severe hyperglycemia r/o DKA Anion gap acidosis - improving Hypocalcemia Hypomagnesemia HTN, HLD, GERD, Anxiety, Depression, Fibromyalgia  Liver lesions NOS- MRI if recovers  Plan:  -Currently on minimal ventilator settings.  Should be ready to commence weaning -Decreasing vasopressor requirements.  Will increase fluid removal with CRRT to increase likelihood of successful extubation. -Stop continuous sedation and obtain best neurological examination. -Appears to be more interactive per family the evening.  May be sundowning.  We will add Seroquel at bedtime to help restore normal sleep-wake cycle. -Hold on MRI at this time as patient is making neurological progress. -Venous duplex Dopplers of legs.  If remaining clot burden, will  proceed with caval filter placement.  Best Practice (right click and "Reselect all SmartList Selections" daily)  Diet/type: tubefeeds DVT prophylaxis: SCD GI prophylaxis: PPI Lines: Central line Foley:   N/A Code Status:  full code Last date of multidisciplinary goals of care discussion: 1/1, continue aggressive care for now  CRITICAL CARE Performed by: Kipp Brood   Total critical care time: 40 minutes  Critical care time was exclusive of separately billable procedures and treating other patients.  Critical care was necessary to treat or prevent imminent or life-threatening deterioration.  Critical care was time spent personally by me on the following activities: development of treatment plan with patient and/or surrogate as well as nursing, discussions with consultants, evaluation of patient's response to treatment, examination of patient, obtaining history from patient or surrogate, ordering and performing treatments and interventions, ordering and review of laboratory studies, ordering and review of radiographic studies, pulse oximetry, re-evaluation of patient's condition and participation in multidisciplinary rounds.  Kipp Brood, MD [facility] ICU Physician [place]  Pager: [phone] Mobile: [phone] After hours: [phone].

## 2021-11-02 NOTE — Progress Notes (Signed)
Lower extremity venous has been completed.   Preliminary results in CV Proc.   [name] 11/02/2021 11:21 AM

## 2021-11-02 NOTE — Progress Notes (Signed)
[facility] Progress Note   71 y.o. female HTN HLD  DM asthma fibromyalgia with a presyncopal episode at home but then lost consciousness before EMS arrived.  VF arrest and had 20 minutes of CPR + coded twice in the ED for 4 and 2 minutes. She required multiple pushes of epinephrine for bradycardia and hypotension. Found to have saddle embolus in LLL with 5m Omnipaque on 1/1 and also had another 1064mof Ominipaque on 1/1 for a CT abdomen pelvis. Patient also treated with Vancomycin + vasopressin + epinephrine + Levophed. Patient underwent mechanical thrombectomy but later had a drop in Hb to 5 with a large peritoneal and parenchymal hemorrhage requiring  5U PRBC, FFP and platelets. She has CKD with slowly rising creatining since 2018 with BL cr in the 1.17-1.3 range.  Assessment/ Plan:   Renal failure secondary to ATN in the setting of cardiac arrest, hypotension, pressors, contrast. She is certainly moving towards RRT. BL CKD3A with BL creatinine in the 1.17-1.3 range in late 2021. - Appreciate CCM placing trialysis catheter in the left femoral vein.  Started on CRRT 10/31/2020 400/200/1500 2K baths 50-10041met UF per hour  Will continue 2K baths dialysate and change to 4K pre/post. Will try 100-200 net UF as tolerated  -Monitor Daily I/Os, Daily weight  -Maintain MAP>65 for optimal renal perfusion. Still on high dose Vasopressin and  Epinephrine. -Avoid nephrotoxic medications including NSAIDs - Dose medications for GFR <15 and adjust when she's on CRRT   PE s/p mechanical thrombectomy complicated by large bleed. Anemia s/p transfusions in DIC s/p Vitamin K, platelets and FFP + PRBC. DM HTN Respiratory failure on nebulizers, ventilator   Subjective:   Able to UF ~100m80m and hypertensive.    Objective:   BP (!) 144/60    Pulse 93    Temp 98.3 F (36.8 C) (Axillary)    Resp (!) 22    Ht _0  (1.651 m)    Wt 99.4 kg    SpO2 94%    BMI 36.47 kg/m    Intake/Output Summary (Last 24 hours) at 11/02/2021 1030 Last data filed at 11/02/2021 0800 Gross per 24 hour  Intake 1814.99 ml  Output 2931 ml  Net -1116.01 ml   Weight change:   Physical Exam: GEN: on Vent, spontaneous eye opening but not following commands HEENT:  conjunctival pallor NECK: Supple, no thyromegaly LUNGS: CTA on vent CV: Tachy ABD: SNDNT no BS  EXT: 1+  lower extremity edema ACCESS: lt fem vasc cath   Imaging: DG Chest 1 View  Result Date: 11/01/2021 CLINICAL DATA:  CPR.  Innovation. EXAM: CHEST  1 VIEW COMPARISON:  10/29/2021 FINDINGS: 0740 hours. Endotracheal tube tip is approximately 2.7 cm above the base of the carina. NG tube tip overlies the gastric fundus. Right IJ central line tip is positioned in the low right atrium. Low lung volumes with bibasilar atelectasis and left-sided pleural effusion. Telemetry leads overlie the chest. IMPRESSION: 1. Endotracheal tube tip is approximately 2.7 cm above the base of the carina. 2. Low volume film with bibasilar atelectasis and left pleural effusion. Electronically Signed   By: EricMisty Stanley.   On: 11/01/2021 07:55   IR US GKoreade Vasc Access Right  Result Date: 11/01/2021 INDICATION: 70 y55r old woman with acute anemia, hypotension, tachycardia due to large volume intraperitoneal hemorrhage originating from left liver lobe lesion presents to IR for angiogram and embolization. EXAM: 1. Ultrasound-guided access of right common femoral artery 2. Celiac angiogram 3. Common  hepatic angiogram 4. Left hepatic angiogram and Gel-Foam embolization 5. Angio-Seal closure of right common femoral artery access MEDICATIONS: None ANESTHESIA/SEDATION: None CONTRAST:  42 mL Intra-arterial Omnipaque 300 FLUOROSCOPY TIME:  Fluoroscopy Time: 7 minutes 42 seconds (466 mGy). COMPLICATIONS: None immediate. PROCEDURE: Informed consent was obtained from the patient's daughter's following explanation of the procedure, risks, benefits and  alternatives. The patient's family understands, agrees and consents for the procedure. All questions were addressed. A time out was performed prior to the initiation of the procedure. Maximal barrier sterile technique utilized including caps, mask, sterile gowns, sterile gloves, large sterile drape, hand hygiene, and chlorhexidine prep. Patient positioned supine on the angiography table. The right groin skin prepped and draped in usual fashion. Ultrasound image documenting patency of the right common femoral artery was obtained and placed in permanent medical record. Sterile ultrasound probe cover and gel utilized throughout the procedure. Using continuous ultrasound guidance, a 21 gauge needle was used to access the right common femoral artery at the level of the femoral head. 21 gauge needle exchanged for a transitional dilator set over 0.018 inch guidewire. Transitional dilator set exchanged for 5 French sheath over 0.035 inch guidewire. Celiac artery accessed with sauce omni catheter. Celiac angiogram showed patent left gastric, splenic, and common hepatic arteries. Microcatheter advanced into the common hepatic artery and angiogram was performed. The gastro duodenal and right hepatic arteries are patent. There is acute cut off of the left hepatic artery likely due to spasm related to hemorrhage in the left hepatic lobe. Microcatheter was advanced into the left hepatic artery an angiogram was performed which again showed the acute cut off of the left hepatic artery. No active extravasation was identified. Given that the CT showed hemorrhage within the left liver lobe, embolization of the left hepatic artery was performed with Gel-Foam slurry until minimal antegrade flow was seen. Post embolization angiogram showed little flow into the left hepatic lobe and reflux into the right hepatic lobe artery. Sheath angiogram showed appropriate puncture of the common femoral artery at the level the femoral head. The groin  was re-prepped and draped and closure was performed with 6 [place] Angio-Seal device. IMPRESSION: Successful embolization of the left hepatic artery with Gel-Foam slurry. Electronically Signed   By: Miachel Roux M.D.   On: 11/01/2021 14:52   IR EMBO ART  VEN HEMORR LYMPH EXTRAV  INC GUIDE ROADMAPPING  Result Date: 11/01/2021 INDICATION: 71 year old woman with acute anemia, hypotension, tachycardia due to large volume intraperitoneal hemorrhage originating from left liver lobe lesion presents to IR for angiogram and embolization. EXAM: 1. Ultrasound-guided access of right common femoral artery 2. Celiac angiogram 3. Common hepatic angiogram 4. Left hepatic angiogram and Gel-Foam embolization 5. Angio-Seal closure of right common femoral artery access MEDICATIONS: None ANESTHESIA/SEDATION: None CONTRAST:  42 mL Intra-arterial Omnipaque 300 FLUOROSCOPY TIME:  Fluoroscopy Time: 7 minutes 42 seconds (466 mGy). COMPLICATIONS: None immediate. PROCEDURE: Informed consent was obtained from the patient's daughter's following explanation of the procedure, risks, benefits and alternatives. The patient's family understands, agrees and consents for the procedure. All questions were addressed. A time out was performed prior to the initiation of the procedure. Maximal barrier sterile technique utilized including caps, mask, sterile gowns, sterile gloves, large sterile drape, hand hygiene, and chlorhexidine prep. Patient positioned supine on the angiography table. The right groin skin prepped and draped in usual fashion. Ultrasound image documenting patency of the right common femoral artery was obtained and placed in permanent medical record. Sterile ultrasound probe cover  and gel utilized throughout the procedure. Using continuous ultrasound guidance, a 21 gauge needle was used to access the right common femoral artery at the level of the femoral head. 21 gauge needle exchanged for a transitional dilator set over 0.018 inch  guidewire. Transitional dilator set exchanged for 5 French sheath over 0.035 inch guidewire. Celiac artery accessed with sauce omni catheter. Celiac angiogram showed patent left gastric, splenic, and common hepatic arteries. Microcatheter advanced into the common hepatic artery and angiogram was performed. The gastro duodenal and right hepatic arteries are patent. There is acute cut off of the left hepatic artery likely due to spasm related to hemorrhage in the left hepatic lobe. Microcatheter was advanced into the left hepatic artery an angiogram was performed which again showed the acute cut off of the left hepatic artery. No active extravasation was identified. Given that the CT showed hemorrhage within the left liver lobe, embolization of the left hepatic artery was performed with Gel-Foam slurry until minimal antegrade flow was seen. Post embolization angiogram showed little flow into the left hepatic lobe and reflux into the right hepatic lobe artery. Sheath angiogram showed appropriate puncture of the common femoral artery at the level the femoral head. The groin was re-prepped and draped and closure was performed with 6 [place] Angio-Seal device. IMPRESSION: Successful embolization of the left hepatic artery with Gel-Foam slurry. Electronically Signed   By: Miachel Roux M.D.   On: 11/01/2021 14:52    Labs: BMET Recent Labs  Lab 10/30/21 1357 10/30/21 1723 10/31/21 0531 10/31/21 1247 10/31/21 1300 10/31/21 1332 10/31/21 2154 11/01/21 0415 11/01/21 0707 11/01/21 1411 11/01/21 1734 11/02/21 0502 11/02/21 0519  NA  --    < > 135 134* 134*  --  135 137   136 135 134* 135 135 135  K 5.8*   < > 5.5* 5.6* 6.2*  --  5.3* 5.6*   5.6* 5.4* 5.2* 4.9 4.6 4.5  CL  --    < > 98 99 98  --   --  102   101  --  100 104 102  --   CO2  --    < > 21* 21* 20*  --   --  17*   16*  --  21* 20* 21*  --   GLUCOSE  --    < > 128* 134* 144*  --   --  90   91  --  162* 162* 158*  --   BUN  --    < > 51* 51* 56*   --   --  42*   40*  --  36* 33* 32*  --   CREATININE  --    < > 5.50* 5.32* 5.93*  --   --  4.44*   4.44*  --  3.58* 3.37* 3.00*  --   CALCIUM  --    < > 7.4* 6.9* 6.8*  --   --  7.1*   7.0*  --  7.0* 6.9* 7.1*  --   PHOS 2.5  --  6.3* 5.4*  --  5.8*  --  6.6*  --   --  5.3* 4.5  --    < > = values in this interval not displayed.   CBC Recent Labs  Lab 10/30/21 0100 10/30/21 0400 10/30/21 0803 10/30/21 0938 10/30/21 1356 10/30/21 1357 10/30/21 1721 11/01/21 1411 11/01/21 1734 11/01/21 2018 11/02/21 0219 11/02/21 0502 11/02/21 0519 11/02/21 0757  WBC 19.4* 21.4*  --  20.2*  --  21.2*   < > 15.8*  --  16.5* 16.4*  --   --  15.8*  NEUTROABS 16.5* 18.2*  --  17.3*  --  17.9*  --   --   --   --   --   --   --   --   HGB 10.5* 9.8*   < > 9.5*   < > 8.3*   < > 9.1*  --  8.8* 8.2*  --  8.2* 8.6*  HCT 29.3* 28.9*   < > 27.0*   < > 22.4*   < > 25.5*  --  24.6* 22.8*  --  24.0* 25.1*  MCV 77.9* 78.5*  --  78.5*  --  77.8*   < > 84.4  --  84.2 84.4  --   --  86.0  PLT 101* 101*   102*  --  92*  --  100*   < > 104*   < > 93* 85* 85*  --  PLATELET CLUMPS NOTED ON SMEAR, UNABLE TO ESTIMATE   < > = values in this interval not displayed.    Medications:     sodium chloride   Intravenous Once   sodium chloride   Intravenous Once   B-complex with vitamin C  1 tablet Per Tube Daily   chlorhexidine gluconate (MEDLINE KIT)  15 mL Mouth Rinse BID   Chlorhexidine Gluconate Cloth  6 each Topical Daily   docusate  100 mg Per Tube BID   fentaNYL (SUBLIMAZE) injection  25 mcg Intravenous Once   hydrocortisone sod succinate (SOLU-CORTEF) inj  100 mg Intravenous BID   mouth rinse  15 mL Mouth Rinse 10 times per day   pantoprazole (PROTONIX) IV  40 mg Intravenous Q12H   polyethylene glycol  17 g Per Tube Daily   QUEtiapine  50 mg Oral QHS   sodium chloride flush  10-40 mL Intracatheter Q12H   sodium chloride flush  10-40 mL Intracatheter Q12H   vancomycin variable dose per unstable renal function  (pharmacist dosing)   Does not apply See admin instructions      Otelia Santee, MD 11/02/2021, 10:30 AM

## 2021-11-02 NOTE — Progress Notes (Signed)
CBG >600 value noted NOT to be accurate d/t being pulled from line that D50 was given in earlier. Re-check CBG from [facility] with results 163 is accurate.

## 2021-11-02 NOTE — Progress Notes (Signed)
Pharmacy Antibiotic Note  [name] is a 71 y.o. female admitted on 10/28/2021 with sepsis.  Pharmacy has been consulted for vancomycin and cefepime dosing.   Currently on CRRT.  MRSA PCR negative.  Plan: Continue cefepime 2 g q24h Continue Vancomycin 1g q 24 hrs Order another vancomycin random as clinically indicated May be able to stop vancomycin soon?  Height: 5\' 5"  (165.1 cm) Weight: 99.4 kg (219 lb 2.2 oz) IBW/kg (Calculated) : 57  Temp (24hrs), Avg:98.1 F (36.7 C), Min:97.8 F (36.6 C), Max:98.3 F (36.8 C)  Recent Labs  Lab 10/30/21 1057 10/30/21 1357 10/31/21 1300 10/31/21 1656 10/31/21 1932 11/01/21 0415 11/01/21 0751 11/01/21 1411 11/01/21 1734 11/01/21 2018 11/02/21 0219 11/02/21 0502 11/02/21 0757  WBC  --    < > 17.9*  --    < > 17.6* 16.1* 15.8*  --  16.5* 16.4*  --  15.8*  CREATININE  --    < > 5.93*  --   --  4.44*   4.44*  --  3.58* 3.37*  --   --  3.00*  --   LATICACIDVEN  --    < > 2.5* 2.3*  --   --   --  2.7*  --  2.2* 1.9  --  2.0*  VANCORANDOM 21  --   --   --   --  17  --   --   --   --   --   --   --    < > = values in this interval not displayed.     Estimated Creatinine Clearance: 19.8 mL/min (A) (by C-G formula based on SCr of 3 mg/dL (H)).    Allergies  Allergen Reactions   Aspirin Nausea And Vomiting    Other reaction(s): Unknown   Benadryl [Diphenhydramine]     Can only take dye free. States the dye causes itching.   Darvon [Propoxyphene] Itching    Can only during the day. Night time makes her itch   Hydrocodone Bit-Homatrop Mbr Itching   Metformin     Other reaction(s): GI   Other     Other reaction(s): Unknown. States reaction was to nasal spray that caused pupils to shrink   Pentazocine     nervous Other reaction(s): jitters   Percocet [Oxycodone-Acetaminophen]     itching   Sulfa Antibiotics Hives   Tetracaine Hcl     Other reaction(s): Unknown. Thinks caused itching.   Tetracyclines & Related Hives   Tramadol      Other reaction(s): itch    Antimicrobials this admission: Vanc 1/1 >> Cefepime 1/1 >> Flagyl 1/4 >>   Microbiology results: 1/1 BCx x 2 >> canceled 1/1 Flu/Covid negative 1/3 resp cx  GPC, moderate yeast > few candida albicans 1/3 Bcx ngtd   Marguerite Olea, [facility] Clinical Pharmacist  11/02/2021 10:59 AM   [facility] pharmacy phone numbers are listed on [email or web address]

## 2021-11-02 NOTE — Progress Notes (Signed)
eLink Physician-Brief Progress Note Patient Name: [name] DOB: January 06, 1951 MRN: [id number]   Date of Service  11/02/2021  HPI/Events of Note  Enteral nutrition started today, blood sugar 224 mg / dl, patient needs SSI coverage order.  eICU Interventions  Hyperglycemia CBG with SQ sensitive SSI coverage protocol ordered.        Frederik Pear 11/02/2021, 8:48 PM

## 2021-11-03 ENCOUNTER — Encounter (HOSPITAL_COMMUNITY): Payer: Self-pay | Admitting: Emergency Medicine

## 2021-11-03 DIAGNOSIS — I469 Cardiac arrest, cause unspecified: Secondary | ICD-10-CM | POA: Diagnosis not present

## 2021-11-03 LAB — BASIC METABOLIC PANEL
Anion gap: 11 (ref 5–15)
BUN: 36 mg/dL — ABNORMAL HIGH (ref 8–23)
CO2: 20 mmol/L — ABNORMAL LOW (ref 22–32)
Calcium: 7.5 mg/dL — ABNORMAL LOW (ref 8.9–10.3)
Chloride: 102 mmol/L (ref 98–111)
Creatinine, Ser: 2.7 mg/dL — ABNORMAL HIGH (ref 0.44–1.00)
GFR, Estimated: 18 mL/min — ABNORMAL LOW (ref >60)
Glucose, Bld: 245 mg/dL — ABNORMAL HIGH (ref 70–99)
Potassium: 4.6 mmol/L (ref 3.5–5.1)
Sodium: 133 mmol/L — ABNORMAL LOW (ref 135–145)

## 2021-11-03 LAB — DIC (DISSEMINATED INTRAVASCULAR COAGULATION)PANEL
D-Dimer, Quant: >20 ug/mL-FEU — ABNORMAL HIGH (ref 0.00–0.50)
D-Dimer, Quant: >20 ug/mL-FEU — ABNORMAL HIGH (ref 0.00–0.50)
Fibrinogen: 594 mg/dL — ABNORMAL HIGH (ref 210–475)
Fibrinogen: 531 mg/dL — ABNORMAL HIGH (ref 210–475)
INR: 1.3 — ABNORMAL HIGH (ref 0.8–1.2)
INR: 1.2 (ref 0.8–1.2)
Platelets: 51 10*3/uL — ABNORMAL LOW (ref 150–400)
Platelets: 33 10*3/uL — ABNORMAL LOW (ref 150–400)
Prothrombin Time: 15.7 seconds — ABNORMAL HIGH (ref 11.4–15.2)
Prothrombin Time: 15.5 seconds — ABNORMAL HIGH (ref 11.4–15.2)
Smear Review: NONE SEEN
Smear Review: NONE SEEN
aPTT: 28 seconds (ref 24–36)
aPTT: 26 seconds (ref 24–36)

## 2021-11-03 LAB — RENAL FUNCTION PANEL
Albumin: 1.9 g/dL — ABNORMAL LOW (ref 3.5–5.0)
Albumin: 2 g/dL — ABNORMAL LOW (ref 3.5–5.0)
Anion gap: 11 (ref 5–15)
Anion gap: 12 (ref 5–15)
BUN: 37 mg/dL — ABNORMAL HIGH (ref 8–23)
BUN: 38 mg/dL — ABNORMAL HIGH (ref 8–23)
CO2: 20 mmol/L — ABNORMAL LOW (ref 22–32)
CO2: 22 mmol/L (ref 22–32)
Calcium: 7.4 mg/dL — ABNORMAL LOW (ref 8.9–10.3)
Calcium: 7.4 mg/dL — ABNORMAL LOW (ref 8.9–10.3)
Chloride: 102 mmol/L (ref 98–111)
Chloride: 100 mmol/L (ref 98–111)
Creatinine, Ser: 2.71 mg/dL — ABNORMAL HIGH (ref 0.44–1.00)
Creatinine, Ser: 2.48 mg/dL — ABNORMAL HIGH (ref 0.44–1.00)
GFR, Estimated: 18 mL/min — ABNORMAL LOW (ref >60)
GFR, Estimated: 20 mL/min — ABNORMAL LOW (ref >60)
Glucose, Bld: 247 mg/dL — ABNORMAL HIGH (ref 70–99)
Glucose, Bld: 237 mg/dL — ABNORMAL HIGH (ref 70–99)
Phosphorus: 4.9 mg/dL — ABNORMAL HIGH (ref 2.5–4.6)
Phosphorus: 3.5 mg/dL (ref 2.5–4.6)
Potassium: 4.6 mmol/L (ref 3.5–5.1)
Potassium: 3.9 mmol/L (ref 3.5–5.1)
Sodium: 133 mmol/L — ABNORMAL LOW (ref 135–145)
Sodium: 134 mmol/L — ABNORMAL LOW (ref 135–145)

## 2021-11-03 LAB — MAGNESIUM: Magnesium: 2.5 mg/dL — ABNORMAL HIGH (ref 1.7–2.4)

## 2021-11-03 LAB — GLUCOSE, CAPILLARY
Glucose-Capillary: 168 mg/dL — ABNORMAL HIGH (ref 70–99)
Glucose-Capillary: 202 mg/dL — ABNORMAL HIGH (ref 70–99)
Glucose-Capillary: 208 mg/dL — ABNORMAL HIGH (ref 70–99)
Glucose-Capillary: 223 mg/dL — ABNORMAL HIGH (ref 70–99)

## 2021-11-03 LAB — CBC
HCT: 24 % — ABNORMAL LOW (ref 36.0–46.0)
Hemoglobin: 7.9 g/dL — ABNORMAL LOW (ref 12.0–15.0)
MCH: 28.9 pg (ref 26.0–34.0)
MCHC: 32.9 g/dL (ref 30.0–36.0)
MCV: 87.9 fL (ref 80.0–100.0)
Platelets: 48 10*3/uL — ABNORMAL LOW (ref 150–400)
RBC: 2.73 MIL/uL — ABNORMAL LOW (ref 3.87–5.11)
RDW: 16.9 % — ABNORMAL HIGH (ref 11.5–15.5)
WBC: 16 10*3/uL — ABNORMAL HIGH (ref 4.0–10.5)
nRBC: 1.3 % — ABNORMAL HIGH (ref 0.0–0.2)

## 2021-11-03 MED ORDER — METOCLOPRAMIDE HCL 5 MG/ML IJ SOLN
10.0000 mg | Freq: Four times a day (QID) | INTRAMUSCULAR | Status: DC
  Administered 2021-11-03 – 2021-11-04 (×4): 10 mg via INTRAVENOUS
  Filled 2021-11-03 (×4): qty 2

## 2021-11-03 NOTE — Progress Notes (Signed)
° [facility] °Progress Note  ° °71 y.o. female HTN HLD  DM asthma fibromyalgia with a presyncopal episode at home but then lost consciousness before EMS arrived.  VF arrest and had 20 minutes of CPR + coded twice in the ED for 4 and 2 minutes. She required multiple pushes of epinephrine for bradycardia and hypotension. Found to have saddle embolus in LLL with 80ml Omnipaque on 1/1 and also had another 100ml of Ominipaque on 1/1 for a CT abdomen pelvis. Patient also treated with Vancomycin + vasopressin + epinephrine + Levophed. Patient underwent mechanical thrombectomy but later had a drop in Hb to 5 with a large peritoneal and parenchymal hemorrhage requiring  5U PRBC, FFP and platelets. She has CKD with slowly rising creatining since 2018 with BL cr in the 1.17-1.3 range. ° °Assessment/ Plan:   °Renal failure secondary to ATN in the setting of cardiac arrest, hypotension, pressors, contrast. She is certainly moving towards RRT. BL CKD3A with BL creatinine in the 1.17-1.3 range in late 2021. °- Appreciate CCM placing trialysis catheter in the left femoral vein. ° °Started on CRRT 10/31/2020 °400/200/1500 4/4/2K baths °50-100mL net UF per hour (currently 100ml/hr net neg) ° °Will continue 2K baths dialysate and 4K pre/post -> will f/u later and determine if we need to switch to 4K for dialysate. °Will try 100-200 net UF as tolerated ° °-Monitor Daily I/Os, Daily weight  °-Maintain MAP>65 for optimal renal perfusion. Still on high dose Vasopressin and  Epinephrine. °-Avoid nephrotoxic medications including NSAIDs °- Dose medications for GFR <15 and adjust when she's on CRRT °  °PE s/p mechanical thrombectomy complicated by large bleed. °Anemia s/p transfusions in DIC s/p Vitamin K, platelets and FFP + PRBC. °DM °HTN °Respiratory failure on nebulizers, ventilator  ° °Subjective:   °Had to stop the  UF last night but challenging again this am at ~100ml/hr. °  ° °Objective:   °BP 128/61    Pulse 76    Temp  (!) 97.2 °F (36.2 °C) (Axillary)    Resp (!) 8    Ht 5' 5" (1.651 m)    Wt 96.5 kg    SpO2 100%    BMI 35.40 kg/m²  ° °Intake/Output Summary (Last 24 hours) at 11/03/2021 1018 °Last data filed at 11/03/2021 1000 °Gross per 24 hour  °Intake 1757.04 ml  °Output 2927 ml  °Net -1169.96 ml  ° °Weight change: -2.9 kg ° °Physical Exam: °GEN: on Vent, spontaneous eye opening but not following commands °HEENT:  conjunctival pallor °NECK: Supple, no thyromegaly °LUNGS: CTA on vent °CV: Tachy °ABD: SNDNT no BS  °EXT: 1+  lower extremity edema °ACCESS: lt fem vasc cath ° ° °Imaging: °VAS US LOWER EXTREMITY VENOUS (DVT) ° °Result Date: 11/02/2021 ° Lower Venous DVT Study Patient Name:  [name]  Date of Exam:   11/02/2021 Medical Rec #: [id number]        Accession #:    [id number] Date of Birth: 06/04/1951        Patient Gender: F Patient Age:   71 years Exam Location:  [facility] Procedure:      VAS US LOWER EXTREMITY VENOUS (DVT) Referring Phys: RAVI AGARWALA --------------------------------------------------------------------------------  Indications: Pulmonary embolism.  Limitations: Body habitus and poor ultrasound/tissue interface. Comparison Study: no prior Performing Technologist: Megan Stricklin RVS  Examination Guidelines: A complete evaluation includes B-mode imaging, spectral Doppler, color Doppler, and power Doppler as needed of all accessible portions of each vessel. Bilateral testing is considered   considered an integral part of a complete examination. Limited examinations for reoccurring indications may be performed as noted. The reflux portion of the exam is performed with the patient in reverse Trendelenburg.  +---------+---------------+---------+-----------+----------+--------------+  RIGHT     Compressibility Phasicity Spontaneity Properties Thrombus Aging  +---------+---------------+---------+-----------+----------+--------------+  CFV       Full            Yes       Yes                                     +---------+---------------+---------+-----------+----------+--------------+  SFJ       Full                                                             +---------+---------------+---------+-----------+----------+--------------+  FV Prox   Full                                                             +---------+---------------+---------+-----------+----------+--------------+  FV Mid                    Yes       Yes                                    +---------+---------------+---------+-----------+----------+--------------+  FV Distal                 Yes       Yes                                    +---------+---------------+---------+-----------+----------+--------------+  PFV       Full                                                             +---------+---------------+---------+-----------+----------+--------------+  POP       Full            Yes       Yes                                    +---------+---------------+---------+-----------+----------+--------------+  PTV       Full                                                             +---------+---------------+---------+-----------+----------+--------------+  PERO      Full                                                             +---------+---------------+---------+-----------+----------+--------------+   +---------+---------------+---------+-----------+----------+--------------+  LEFT      Compressibility Phasicity Spontaneity Properties Thrombus Aging  +---------+---------------+---------+-----------+----------+--------------+  CFV       Full            Yes       Yes                                    +---------+---------------+---------+-----------+----------+--------------+  SFJ       Full                                                             +---------+---------------+---------+-----------+----------+--------------+  FV Prox   Full                                                              +---------+---------------+---------+-----------+----------+--------------+  FV Mid                    Yes       Yes                                    +---------+---------------+---------+-----------+----------+--------------+  FV Distal                 Yes       Yes                                    +---------+---------------+---------+-----------+----------+--------------+  PFV       Full                                                             +---------+---------------+---------+-----------+----------+--------------+  POP       Full            Yes       Yes                                    +---------+---------------+---------+-----------+----------+--------------+  PTV       Full                                                             +---------+---------------+---------+-----------+----------+--------------+  PERO      Full                                                             +---------+---------------+---------+-----------+----------+--------------+       Summary: BILATERAL: - No evidence of deep vein thrombosis seen in the lower extremities, bilaterally. -No evidence of popliteal cyst, bilaterally.   *See table(s) above for measurements and observations. Electronically signed by Joshua Robins on 11/02/2021 at 6:56:05 PM.    Final    ° °Labs: °BMET °Recent Labs  °Lab 10/31/21 °1247 10/31/21 °1300 10/31/21 °1332 10/31/21 °2154 11/01/21 °0415 11/01/21 °0707 11/01/21 °1411 11/01/21 °1734 11/02/21 °0502 11/02/21 °0519 11/02/21 °1800 11/03/21 °0447  °NA 134* 134*  --    < > 137   136 135 134* 135 135 135 132* 133*   133*  °K 5.6* 6.2*  --    < > 5.6*   5.6* 5.4* 5.2* 4.9 4.6 4.5 4.4 4.6   4.6  °CL 99 98  --   --  102   101  --  100 104 102  --  100 102   102  °CO2 21* 20*  --   --  17*   16*  --  21* 20* 21*  --  18* 20*   20*  °GLUCOSE 134* 144*  --   --  90   91  --  162* 162* 158*  --  245* 245*   247*  °BUN 51* 56*  --   --  42*   40*  --  36* 33* 32*  --  35* 36*   37*  °CREATININE 5.32* 5.93*   --   --  4.44*   4.44*  --  3.58* 3.37* 3.00*  --  2.87* 2.70*   2.71*  °CALCIUM 6.9* 6.8*  --   --  7.1*   7.0*  --  7.0* 6.9* 7.1*  --  7.4* 7.5*   7.4*  °PHOS 5.4*  --  5.8*  --  6.6*  --   --  5.3* 4.5  --  5.0* 4.9*  ° < > = values in this interval not displayed.  ° °CBC °Recent Labs  °Lab 10/30/21 °0100 10/30/21 °0400 10/30/21 °0803 10/30/21 °0938 10/30/21 °1356 10/30/21 °1357 10/30/21 °1721 11/02/21 °0219 11/02/21 °0502 11/02/21 °0519 11/02/21 °0757 11/02/21 °1439 11/02/21 °1800 11/03/21 °0447 11/03/21 °0737  °WBC 19.4* 21.4*  --  20.2*  --  21.2*   < > 16.4*  --   --  15.8* 16.0*  --   --  16.0*  °NEUTROABS 16.5* 18.2*  --  17.3*  --  17.9*  --   --   --   --   --   --   --   --   --   °HGB 10.5* 9.8*   < > 9.5*   < > 8.3*   < > 8.2*  --  8.2* 8.6* 8.1*  --   --  7.9*  °HCT 29.3* 28.9*   < > 27.0*   < > 22.4*   < > 22.8*  --  24.0* 25.1* 24.7*  --   --  24.0*  °MCV 77.9* 78.5*  --  78.5*  --  77.8*   < > 84.4  --   --  86.0 87.6  --   --  87.9  °PLT 101* 101*   102*  --  92*  --  100*   < > 85*   < >  --  PLATELET CLUMPS NOTED ON SMEAR, UNABLE TO ESTIMATE PLATELET CLUMPS NOTED ON SMEAR, UNABLE TO ESTIMATE 70* 51* 48*  ° < > = values in this interval not displayed.  ° ° °Medications:   ° °   sodium chloride   Intravenous Once   sodium chloride   Intravenous Once   B-complex with vitamin C  1 tablet Per Tube Daily   chlorhexidine gluconate (MEDLINE KIT)  15 mL Mouth Rinse BID   Chlorhexidine Gluconate Cloth  6 each Topical Daily   docusate  100 mg Per Tube BID   fentaNYL (SUBLIMAZE) injection  25 mcg Intravenous Once   hydrocortisone sod succinate (SOLU-CORTEF) inj  100 mg Intravenous BID   insulin aspart  1-3 Units Subcutaneous Q4H   mouth rinse  15 mL Mouth Rinse 10 times per day   metoCLOPramide (REGLAN) injection  10 mg Intravenous Q6H   pantoprazole (PROTONIX) IV  40 mg Intravenous Q12H   polyethylene glycol  17 g Per Tube Daily   QUEtiapine  50 mg Per Tube QHS   sodium chloride flush  10-40  mL Intracatheter Q12H   sodium chloride flush  10-40 mL Intracatheter Q12H   vancomycin variable dose per unstable renal function (pharmacist dosing)   Does not apply See admin instructions      Otelia Santee, MD 11/03/2021, 10:18 AM

## 2021-11-03 NOTE — Progress Notes (Signed)
NAME:  [name], MRN:  [id number], DOB:  February 08, 1951, LOS: 6 ADMISSION DATE:  10/28/2021, CONSULTATION DATE: 10/28/21 REFERRING MD: Dr. Sherry Ruffing, CHIEF COMPLAINT:  Cardiac Arrest, VF   History of Present Illness:  [name] presenting with prolonged OOH VF arrest secondary to massive pulmonary embolus complicated by DIC.  Pertinent  Medical History  Anxiety / Depression  Fibromyalgia  Asthma  DM  GERD  HTN  HLD   Significant Hospital Events: Including procedures, antibiotic start and stop dates in addition to other pertinent events   1/1 Admit post VF arrest  1/2 S/p mechanical thrombectomy of right and left PA 1/3 Weaned to low dose epi and levo. In the evening increased pressor requirement and Hg drop to ~5. Found with large intraperitoneal/intraparenchymal hemorrhage extending into left subcapsular hematoma. Received PRBC x 5, FFP x 5 and Plt x 1 1/4 Overnight worsening pressor requirement and Hg ~4. Transfused PRBC x 3, FFP x 2 and platelet x 2. S/p embolization of left hepatic lobe Improving vasopressor requirements.  Interim History / Subjective:  Improved blood pressure.  Less responsive today but was on high-dose fentanyl overnight.  Objective   Blood pressure 126/61, pulse 96, temperature (!) 97.2 F (36.2 C), temperature source Axillary, resp. rate 10, height 5\' 5"  (1.651 m), weight 96.5 kg, SpO2 98 %. CVP:  [9 mmHg-27 mmHg] 9 mmHg  Vent Mode: PSV;CPAP FiO2 (%):  [40 %-45 %] 40 % Set Rate:  [22 bmp] 22 bmp Vt Set:  [490 mL] 490 mL PEEP:  [5 cmH20] 5 cmH20 Pressure Support:  [5 cmH20] 5 cmH20 Plateau Pressure:  [17 cmH20-29 cmH20] 17 cmH20   Intake/Output Summary (Last 24 hours) at 11/03/2021 1354 Last data filed at 11/03/2021 1300 Gross per 24 hour  Intake 1485.47 ml  Output 3246 ml  Net -1760.53 ml    Filed Weights   10/31/21 0500 11/02/21 0500 11/03/21 0600  Weight: 93.6 kg 99.4 kg 96.5 kg   Physical Exam: General: Critically ill-appearing, now off  sedation HENT: [place], AT, ETT in place Eyes: EOMI, no scleral icterus Respiratory: Clear bilaterally with no dyssynchrony. Cardiovascular: RRR, -M/R/G, no JVD GI: Hypoactive, soft, nontender Extremities:-Edema,-tenderness Neuro: Opens eyes to voice.  Does not predictably squeeze fingers.   Ancillary tests personally reviewed:  Sodium 133.  Creatinine 2.7 Doppler ultrasound shows no evidence of DVT  Assessment & Plan:  Acute metabolic encephalopathy in setting of arrest OOH prolonged Vfib Arrest with multiorgan failure Secondary to unprovoked massive pulmonary embolism with DIC  Obstructive shock secondary to PE Complicated by hemorraghic shock 1/3, s/p 5 U PRBC, FFP x 5 and Plt x 1 Acute hypoxemic respiratory failure secondary to cardiac arrest, aspiration pneumonia Retroperitoneal bleed/Hemorrhagic shock/Acute blood loss anemia originating from left liver lobe s/p embolization 1/4 DIC  Acute renal failure, related to low perfusion during arrest - DM II with severe hyperglycemia r/o DKA Anion gap acidosis - improving Hypocalcemia Hypomagnesemia HTN, HLD, GERD, Anxiety, Depression, Fibromyalgia  Liver lesions NOS- MRI if recovers  Plan:  -Currently on minimal ventilator settings.  Should be ready to commence weaning once sedation minimized -Decreasing vasopressor requirements.  Will increase fluid removal with CRRT to increase likelihood of successful extubation. -Stop continuous sedation and obtain best neurological examination. -We will need to consider timing of re-anticoagulation now that retroperitoneal bleeding has ceased.  Have held on filter placement given no evidence of clot burden in the legs.  Best Practice (right click and "Reselect all SmartList Selections" daily)  Diet/type: tubefeeds DVT prophylaxis: SCD GI prophylaxis: PPI Lines: Central line Foley:  N/A Code Status:  full code Last date of multidisciplinary goals of care discussion: 1/1, continue aggressive  care for now  CRITICAL CARE Performed by: Kipp Brood   Total critical care time: 35 minutes  Critical care time was exclusive of separately billable procedures and treating other patients.  Critical care was necessary to treat or prevent imminent or life-threatening deterioration.  Critical care was time spent personally by me on the following activities: development of treatment plan with patient and/or surrogate as well as nursing, discussions with consultants, evaluation of patient's response to treatment, examination of patient, obtaining history from patient or surrogate, ordering and performing treatments and interventions, ordering and review of laboratory studies, ordering and review of radiographic studies, pulse oximetry, re-evaluation of patient's condition and participation in multidisciplinary rounds.  Kipp Brood, MD [facility] ICU Physician [place]  Pager: [phone] Mobile: [phone] After hours: [phone].

## 2021-11-04 ENCOUNTER — Inpatient Hospital Stay (HOSPITAL_COMMUNITY): Payer: Medicare PPO

## 2021-11-04 DIAGNOSIS — I469 Cardiac arrest, cause unspecified: Secondary | ICD-10-CM | POA: Diagnosis not present

## 2021-11-04 LAB — DIC (DISSEMINATED INTRAVASCULAR COAGULATION)PANEL
D-Dimer, Quant: >20 ug/mL-FEU — ABNORMAL HIGH (ref 0.00–0.50)
D-Dimer, Quant: >20 ug/mL-FEU — ABNORMAL HIGH (ref 0.00–0.50)
Fibrinogen: 573 mg/dL — ABNORMAL HIGH (ref 210–475)
Fibrinogen: 571 mg/dL — ABNORMAL HIGH (ref 210–475)
INR: 1.1 (ref 0.8–1.2)
INR: 1.3 — ABNORMAL HIGH (ref 0.8–1.2)
Platelets: 28 10*3/uL — CL (ref 150–400)
Platelets: 26 10*3/uL — CL (ref 150–400)
Prothrombin Time: 14.7 seconds (ref 11.4–15.2)
Prothrombin Time: 15.9 seconds — ABNORMAL HIGH (ref 11.4–15.2)
Smear Review: NONE SEEN
Smear Review: NONE SEEN
aPTT: 31 seconds (ref 24–36)
aPTT: 34 seconds (ref 24–36)

## 2021-11-04 LAB — GLUCOSE, CAPILLARY
Glucose-Capillary: 194 mg/dL — ABNORMAL HIGH (ref 70–99)
Glucose-Capillary: 147 mg/dL — ABNORMAL HIGH (ref 70–99)
Glucose-Capillary: 122 mg/dL — ABNORMAL HIGH (ref 70–99)
Glucose-Capillary: 134 mg/dL — ABNORMAL HIGH (ref 70–99)
Glucose-Capillary: 161 mg/dL — ABNORMAL HIGH (ref 70–99)
Glucose-Capillary: 137 mg/dL — ABNORMAL HIGH (ref 70–99)

## 2021-11-04 LAB — CULTURE, BLOOD (ROUTINE X 2)
Culture: NO GROWTH
Culture: NO GROWTH
Special Requests: ADEQUATE

## 2021-11-04 LAB — CBC
HCT: 24.9 % — ABNORMAL LOW (ref 36.0–46.0)
HCT: 25.8 % — ABNORMAL LOW (ref 36.0–46.0)
Hemoglobin: 8.5 g/dL — ABNORMAL LOW (ref 12.0–15.0)
Hemoglobin: 8.5 g/dL — ABNORMAL LOW (ref 12.0–15.0)
MCH: 29.6 pg (ref 26.0–34.0)
MCH: 28.6 pg (ref 26.0–34.0)
MCHC: 34.1 g/dL (ref 30.0–36.0)
MCHC: 32.9 g/dL (ref 30.0–36.0)
MCV: 86.8 fL (ref 80.0–100.0)
MCV: 86.9 fL (ref 80.0–100.0)
Platelets: 28 10*3/uL — CL (ref 150–400)
Platelets: 26 10*3/uL — CL (ref 150–400)
RBC: 2.87 MIL/uL — ABNORMAL LOW (ref 3.87–5.11)
RBC: 2.97 MIL/uL — ABNORMAL LOW (ref 3.87–5.11)
RDW: 16.7 % — ABNORMAL HIGH (ref 11.5–15.5)
RDW: 17.1 % — ABNORMAL HIGH (ref 11.5–15.5)
WBC: 18.7 10*3/uL — ABNORMAL HIGH (ref 4.0–10.5)
WBC: 20.2 10*3/uL — ABNORMAL HIGH (ref 4.0–10.5)
nRBC: 2.8 % — ABNORMAL HIGH (ref 0.0–0.2)
nRBC: 4 % — ABNORMAL HIGH (ref 0.0–0.2)

## 2021-11-04 LAB — MAGNESIUM: Magnesium: 2.6 mg/dL — ABNORMAL HIGH (ref 1.7–2.4)

## 2021-11-04 LAB — RENAL FUNCTION PANEL
Albumin: 1.9 g/dL — ABNORMAL LOW (ref 3.5–5.0)
Albumin: 2 g/dL — ABNORMAL LOW (ref 3.5–5.0)
Anion gap: 12 (ref 5–15)
Anion gap: 14 (ref 5–15)
BUN: 36 mg/dL — ABNORMAL HIGH (ref 8–23)
BUN: 37 mg/dL — ABNORMAL HIGH (ref 8–23)
CO2: 21 mmol/L — ABNORMAL LOW (ref 22–32)
CO2: 18 mmol/L — ABNORMAL LOW (ref 22–32)
Calcium: 7.5 mg/dL — ABNORMAL LOW (ref 8.9–10.3)
Calcium: 7.3 mg/dL — ABNORMAL LOW (ref 8.9–10.3)
Chloride: 101 mmol/L (ref 98–111)
Chloride: 101 mmol/L (ref 98–111)
Creatinine, Ser: 2.43 mg/dL — ABNORMAL HIGH (ref 0.44–1.00)
Creatinine, Ser: 2.42 mg/dL — ABNORMAL HIGH (ref 0.44–1.00)
GFR, Estimated: 21 mL/min — ABNORMAL LOW (ref >60)
GFR, Estimated: 21 mL/min — ABNORMAL LOW (ref >60)
Glucose, Bld: 177 mg/dL — ABNORMAL HIGH (ref 70–99)
Glucose, Bld: 147 mg/dL — ABNORMAL HIGH (ref 70–99)
Phosphorus: 2.5 mg/dL (ref 2.5–4.6)
Phosphorus: 2.6 mg/dL (ref 2.5–4.6)
Potassium: 3.8 mmol/L (ref 3.5–5.1)
Potassium: 3.6 mmol/L (ref 3.5–5.1)
Sodium: 134 mmol/L — ABNORMAL LOW (ref 135–145)
Sodium: 133 mmol/L — ABNORMAL LOW (ref 135–145)

## 2021-11-04 LAB — HEPARIN LEVEL (UNFRACTIONATED): Heparin Unfractionated: <0.1 IU/mL — ABNORMAL LOW (ref 0.30–0.70)

## 2021-11-04 MED ORDER — FENTANYL CITRATE PF 50 MCG/ML IJ SOSY
PREFILLED_SYRINGE | INTRAMUSCULAR | Status: AC
  Administered 2021-11-04: 50 ug via INTRAVENOUS
  Filled 2021-11-04: qty 1

## 2021-11-04 MED ORDER — SODIUM CHLORIDE 0.9 % IV SOLN: 2.0000 g | Freq: Two times a day (BID) | INTRAVENOUS | Status: DC

## 2021-11-04 MED ORDER — FENTANYL CITRATE PF 50 MCG/ML IJ SOSY
25.0000 ug | PREFILLED_SYRINGE | INTRAMUSCULAR | Status: DC | PRN
  Administered 2021-11-04: 100 ug via INTRAVENOUS
  Administered 2021-11-04 – 2021-11-06 (×3): 50 ug via INTRAVENOUS
  Filled 2021-11-04 (×3): qty 1
  Filled 2021-11-04: qty 2

## 2021-11-04 MED ORDER — SODIUM CHLORIDE 0.9 % IV SOLN
1.0000 g | Freq: Three times a day (TID) | INTRAVENOUS | Status: DC
  Administered 2021-11-04 – 2021-11-07 (×9): 1 g via INTRAVENOUS
  Filled 2021-11-04 (×10): qty 1

## 2021-11-04 MED ORDER — HEPARIN (PORCINE) 25000 UT/250ML-% IV SOLN
1150.0000 [IU]/h | INTRAVENOUS | Status: DC
  Administered 2021-11-04: 11:00:00 600 [IU]/h via INTRAVENOUS
  Administered 2021-11-05: 850 [IU]/h via INTRAVENOUS
  Administered 2021-11-06: 1150 [IU]/h via INTRAVENOUS
  Filled 2021-11-04 (×2): qty 250

## 2021-11-04 NOTE — Progress Notes (Signed)
Patient transported on vent to CT and back to 9O28 without complications.

## 2021-11-04 NOTE — Progress Notes (Signed)
[place] for heparin  Indication: pulmonary embolus  Allergies  Allergen Reactions   Aspirin Nausea And Vomiting    Other reaction(s): Unknown   Benadryl [Diphenhydramine]     Can only take dye free. States the dye causes itching.   Darvon [Propoxyphene] Itching    Can only during the day. Night time makes her itch   Hydrocodone Bit-Homatrop Mbr Itching   Metformin     Other reaction(s): GI   Other     Other reaction(s): Unknown. States reaction was to nasal spray that caused pupils to shrink   Pentazocine     nervous Other reaction(s): jitters   Percocet [Oxycodone-Acetaminophen]     itching   Sulfa Antibiotics Hives   Tetracaine Hcl     Other reaction(s): Unknown. Thinks caused itching.   Tetracyclines & Related Hives   Tramadol     Other reaction(s): itch    Patient Measurements: Height: 5\' 5"  (165.1 cm) Weight: 95.3 kg (210 lb 1.6 oz) IBW/kg (Calculated) : 57  Vital Signs: Temp: 98.9 F (37.2 C) (01/08 2000) Temp Source: Axillary (01/08 2000) BP: 160/77 (01/08 2000) Pulse Rate: 109 (01/08 2000)  Labs: Recent Labs    11/03/21 0737 11/03/21 1600 11/03/21 1700 11/04/21 0444 11/04/21 0539 11/04/21 1606 11/04/21 1754  HGB 7.9*  --   --  8.5*  --  8.5*  --   HCT 24.0*  --   --  24.9*  --  25.8*  --   PLT 48*  --  33* 28* 28* 26*   26*  --   APTT  --   --  26  --  31 34  --   LABPROT  --   --  15.5*  --  14.7 15.9*  --   INR  --   --  1.2  --  1.1 1.3*  --   HEPARINUNFRC  --   --   --   --   --   --  <0.10*  CREATININE  --  2.48*  --   --  2.43* 2.42*  --      Estimated Creatinine Clearance: 24 mL/min (A) (by C-G formula based on SCr of 2.42 mg/dL (H)).   Medical History: Past Medical History:  Diagnosis Date   Anxiety    Arthritis    Asthma    Depression    Diabetes mellitus without complication (HCC)    Fibromyalgia    GERD (gastroesophageal reflux disease)    Hyperlipemia    Hypertension    PONV  (postoperative nausea and vomiting)     Assessment: 71 yo female with saddle PE - s/p thrombectomy and started on heparin - then stopped after intraperitoneal bleed.   Patient with low pltc 20s, D-Dimer back up >20, fibrinogen back up 570 and CRRT circuit clotting Resume low dose heparin drip no bolus and up titrate slowly.  Heparin level undetectable on 600 units/hr. Hgb and plt count stable on recheck tonight. Will be conservative on dose adjustments with low platelet count.   Goal of Therapy:  Heparin level 0.3-0.5 units/ml Monitor platelets by anticoagulation protocol: Yes   Plan:  Increase Heparin drip 750 uts/hr  Heparin level and CBC in am Monitor s/s bleeding   Erin Hearing PharmD., BCPS Clinical Pharmacist 11/04/2021 8:38 PM

## 2021-11-04 NOTE — Progress Notes (Signed)
NAME:  [name], MRN:  [id number], DOB:  10/06/1951, LOS: 7 ADMISSION DATE:  10/28/2021, CONSULTATION DATE: 10/28/21 REFERRING MD: Dr. Sherry Ruffing, CHIEF COMPLAINT:  Cardiac Arrest, VF   History of Present Illness:  [name] presenting with prolonged OOH VF arrest secondary to massive pulmonary embolus complicated by DIC.  Pertinent  Medical History  Anxiety / Depression  Fibromyalgia  Asthma  DM  GERD  HTN  HLD   Significant Hospital Events: Including procedures, antibiotic start and stop dates in addition to other pertinent events   1/1 Admit post VF arrest  1/2 S/p mechanical thrombectomy of right and left PA 1/3 Weaned to low dose epi and levo. In the evening increased pressor requirement and Hg drop to ~5. Found with large intraperitoneal/intraparenchymal hemorrhage extending into left subcapsular hematoma. Received PRBC x 5, FFP x 5 and Plt x 1 1/4 Overnight worsening pressor requirement and Hg ~4. Transfused PRBC x 3, FFP x 2 and platelet x 2. S/p embolization of left hepatic lobe Improving vasopressor requirements.  Interim History / Subjective:  Remains less responsive today in spite of being off all sedation. Increased respiratory distress on weaning today with increased sputum production.  Tolerating fluid removal with normal BP off all pressors.  Frequent circuit clotting. Objective   Blood pressure (!) 153/73, pulse (!) 111, temperature 98.9 F (37.2 C), temperature source Axillary, resp. rate (!) 29, height 5\' 5"  (1.651 m), weight 95.3 kg, SpO2 100 %.    Vent Mode: PRVC FiO2 (%):  [40 %] 40 % Set Rate:  [22 bmp] 22 bmp Vt Set:  [490 mL] 490 mL PEEP:  [5 cmH20] 5 cmH20 Plateau Pressure:  [19 cmH20-26 cmH20] 19 cmH20   Intake/Output Summary (Last 24 hours) at 11/04/2021 2010 Last data filed at 11/04/2021 1900 Gross per 24 hour  Intake 693.89 ml  Output 4908 ml  Net -4214.11 ml    Filed Weights   11/02/21 0500 11/03/21 0600 11/04/21 0500  Weight: 99.4 kg 96.5  kg 95.3 kg   Physical Exam: General: Critically ill-appearing, now off sedation HENT: [place], AT, ETT in place Eyes: EOMI, no scleral icterus Respiratory:  Increased work of breathing with diffuse rhonchi Cardiovascular: RRR, -M/R/G, no JVD GI: Hypoactive, soft, nontender Extremities:-Edema,-tenderness Neuro: Opens eyes to voice.  Not following commands   Ancillary tests personally reviewed:  EEG: shows no seizures CT unremarkable. Cr: 2.42 WBC: 20.2 PLT: 26 Assessment & Plan:  Acute metabolic encephalopathy in setting of arrest OOH prolonged Vfib Arrest with multiorgan failure Secondary to unprovoked massive pulmonary embolism with DIC  Obstructive shock secondary to PE Complicated by hemorraghic shock 1/3, s/p 5 U PRBC, FFP x 5 and Plt x 1 Acute hypoxemic respiratory failure secondary to cardiac arrest, aspiration pneumonia Retroperitoneal bleed/Hemorrhagic shock/Acute blood loss anemia originating from left liver lobe s/p embolization 1/4 DIC  Acute renal failure, related to low perfusion during arrest - DM II with severe hyperglycemia r/o DKA Anion gap acidosis - improving Hypocalcemia Hypomagnesemia HTN, HLD, GERD, Anxiety, Depression, Fibromyalgia  Liver lesions NOS- MRI if recovers  Plan:  -Culture sputum and broaden to meropenem - HAI may be cause of decreased responsiveness - continue aggressive UF -Start low dose heparin to prevent circuit clotting. BP should allow transition to IHD   Best Practice (right click and "Reselect all SmartList Selections" daily)  Diet/type: tubefeeds DVT prophylaxis: SCD GI prophylaxis: PPI Lines: Central line Foley:  N/A Code Status:  full code Last date of multidisciplinary goals of  care discussion: 1/1, continue aggressive care for now  CRITICAL CARE Performed by: Kipp Brood   Total critical care time: 35 minutes  Critical care time was exclusive of separately billable procedures and treating other  patients.  Critical care was necessary to treat or prevent imminent or life-threatening deterioration.  Critical care was time spent personally by me on the following activities: development of treatment plan with patient and/or surrogate as well as nursing, discussions with consultants, evaluation of patient's response to treatment, examination of patient, obtaining history from patient or surrogate, ordering and performing treatments and interventions, ordering and review of laboratory studies, ordering and review of radiographic studies, pulse oximetry, re-evaluation of patient's condition and participation in multidisciplinary rounds.  Kipp Brood, MD [facility] ICU Physician [place]  Pager: [phone] Mobile: [phone] After hours: [phone].

## 2021-11-04 NOTE — Procedures (Signed)
History: 71 yo F with encephalopathy  Sedation: fentanyl prn  Technique: This EEG was acquired with electrodes placed according to the International 10-20 electrode system (including Fp1, Fp2, F3, F4, C3, C4, P3, P4, O1, O2, T3, T4, T5, T6, A1, A2, Fz, Cz, Pz). The following electrodes were missing or displaced: none.   Background: The background consists predominantly of generalized irregular delta activity.  There is some superimposed irregular theta as well.  There is no clear posterior dominant rhythm.  There was no epileptiform discharges seen.  Clear sleep structures were not seen.  Photic stimulation: Physiologic driving is now performed  EEG Abnormalities: 1) generalized irregular slow activity 2) absent posterior dominant rhythm  Clinical Interpretation: This EEG is consistent with a generalized non-specific cerebral dysfunction(encephalopathy). There was no seizure or seizure predisposition recorded on this study. Please note that lack of epileptiform activity on EEG does not preclude the possibility of epilepsy.   Roland Rack, MD [facility] [phone]  If 7pm- 7am, please page neurology on call as listed in [place].

## 2021-11-04 NOTE — Progress Notes (Signed)
[facility] Progress Note   70 y.o. female HTN HLD  DM asthma fibromyalgia with a presyncopal episode at home but then lost consciousness before EMS arrived.  VF arrest and had 20 minutes of CPR + coded twice in the ED for 4 and 2 minutes. She required multiple pushes of epinephrine for bradycardia and hypotension. Found to have saddle embolus in LLL with 3m Omnipaque on 1/1 and also had another 1071mof Ominipaque on 1/1 for a CT abdomen pelvis. Patient also treated with Vancomycin + vasopressin + epinephrine + Levophed. Patient underwent mechanical thrombectomy but later had a drop in Hb to 5 with a large peritoneal and parenchymal hemorrhage requiring  5U PRBC, FFP and platelets. She has CKD with slowly rising creatining since 2018 with BL cr in the 1.17-1.3 range.  Assessment/ Plan:   Renal failure secondary to ATN in the setting of cardiac arrest, hypotension, pressors, contrast. She is certainly moving towards RRT. BL CKD3A with BL creatinine in the 1.17-1.3 range in late 2021. - Appreciate CCM placing trialysis catheter in the left femoral vein.  Started on CRRT 10/31/2020 400/200/1500 4/4/2K baths 350 mL net UF per hour currently but had been tolerating 200 ml/hr net neg  Will continue 2K baths dialysate and 4K pre/post -> she is coming off CRRT for a CT scan. May need to switch to 4K for dialysate but would hold for now given filter clotting (now on hep600) + CT.  -Monitor Daily I/Os, Daily weight  -Maintain MAP>65 for optimal renal perfusion. Off pressors now. -Avoid nephrotoxic medications including NSAIDs - Dose medications for GFR <15 and adjust when she's on CRRT   PE s/p mechanical thrombectomy complicated by large bleed. Anemia s/p transfusions in DIC s/p Vitamin K, platelets and FFP + PRBC. DM HTN Respiratory failure on nebulizers, ventilator   Subjective:   Filter clotting -> heparin 600/hr; mental status worse this am.    Objective:   BP (!) 169/68     Pulse (!) 116    Temp 99.3 F (37.4 C) (Axillary)    Resp (!) 22    Ht 5' 5"  (1.651 m)    Wt 95.3 kg    SpO2 100%    BMI 34.96 kg/m   Intake/Output Summary (Last 24 hours) at 11/04/2021 1148 Last data filed at 11/04/2021 1100 Gross per 24 hour  Intake 590.94 ml  Output 4294 ml  Net -3703.06 ml   Weight change: -1.2 kg  Physical Exam: GEN: on Vent, spontaneous eye opening but not following commands HEENT:  conjunctival pallor NECK: Supple, no thyromegaly LUNGS: CTA on vent CV: Tachy ABD: SNDNT no BS  EXT: 1+  lower extremity edema ACCESS: lt fem vasc cath   Imaging: No results found.  Labs: BMET Recent Labs  Lab 11/01/21 0415 11/01/21 0707 11/01/21 1411 11/01/21 1734 11/02/21 0502 11/02/21 0519 11/02/21 1800 11/03/21 0447 11/03/21 1600 11/04/21 0539  NA 137   136   < > 134* 135 135 135 132* 133*   133* 134* 134*  K 5.6*   5.6*   < > 5.2* 4.9 4.6 4.5 4.4 4.6   4.6 3.9 3.8  CL 102   101  --  100 104 102  --  100 102   102 100 101  CO2 17*   16*  --  21* 20* 21*  --  18* 20*   20* 22 21*  GLUCOSE 90   91  --  162* 162* 158*  --  245*  245*   247* 237* 177*  BUN 42*   40*  --  36* 33* 32*  --  35* 36*   37* 38* 36*  CREATININE 4.44*   4.44*  --  3.58* 3.37* 3.00*  --  2.87* 2.70*   2.71* 2.48* 2.43*  CALCIUM 7.1*   7.0*  --  7.0* 6.9* 7.1*  --  7.4* 7.5*   7.4* 7.4* 7.5*  PHOS 6.6*  --   --  5.3* 4.5  --  5.0* 4.9* 3.5 2.5   < > = values in this interval not displayed.   CBC Recent Labs  Lab 10/30/21 0100 10/30/21 0400 10/30/21 0803 10/30/21 0938 10/30/21 1356 10/30/21 1357 10/30/21 1721 11/02/21 0757 11/02/21 1439 11/02/21 1800 11/03/21 0737 11/03/21 1700 11/04/21 0444 11/04/21 0539  WBC 19.4* 21.4*  --  20.2*  --  21.2*   < > 15.8* 16.0*  --  16.0*  --  18.7*  --   NEUTROABS 16.5* 18.2*  --  17.3*  --  17.9*  --   --   --   --   --   --   --   --   HGB 10.5* 9.8*   < > 9.5*   < > 8.3*   < > 8.6* 8.1*  --  7.9*  --  8.5*  --   HCT 29.3* 28.9*   < >  27.0*   < > 22.4*   < > 25.1* 24.7*  --  24.0*  --  24.9*  --   MCV 77.9* 78.5*  --  78.5*  --  77.8*   < > 86.0 87.6  --  87.9  --  86.8  --   PLT 101* 101*   102*  --  92*  --  100*   < > PLATELET CLUMPS NOTED ON SMEAR, UNABLE TO ESTIMATE PLATELET CLUMPS NOTED ON SMEAR, UNABLE TO ESTIMATE   < > 48* 33* 28* 28*   < > = values in this interval not displayed.    Medications:     sodium chloride   Intravenous Once   sodium chloride   Intravenous Once   B-complex with vitamin C  1 tablet Per Tube Daily   chlorhexidine gluconate (MEDLINE KIT)  15 mL Mouth Rinse BID   Chlorhexidine Gluconate Cloth  6 each Topical Daily   docusate  100 mg Per Tube BID   hydrocortisone sod succinate (SOLU-CORTEF) inj  100 mg Intravenous BID   insulin aspart  1-3 Units Subcutaneous Q4H   mouth rinse  15 mL Mouth Rinse 10 times per day   pantoprazole (PROTONIX) IV  40 mg Intravenous Q12H   polyethylene glycol  17 g Per Tube Daily   QUEtiapine  50 mg Per Tube QHS   sodium chloride flush  10-40 mL Intracatheter Q12H   sodium chloride flush  10-40 mL Intracatheter Q12H      Otelia Santee, MD 11/04/2021, 11:48 AM

## 2021-11-04 NOTE — Progress Notes (Signed)
EEG complete - results pending 

## 2021-11-04 NOTE — Progress Notes (Signed)
[place] for heparin  Indication: pulmonary embolus  Allergies  Allergen Reactions   Aspirin Nausea And Vomiting    Other reaction(s): Unknown   Benadryl [Diphenhydramine]     Can only take dye free. States the dye causes itching.   Darvon [Propoxyphene] Itching    Can only during the day. Night time makes her itch   Hydrocodone Bit-Homatrop Mbr Itching   Metformin     Other reaction(s): GI   Other     Other reaction(s): Unknown. States reaction was to nasal spray that caused pupils to shrink   Pentazocine     nervous Other reaction(s): jitters   Percocet [Oxycodone-Acetaminophen]     itching   Sulfa Antibiotics Hives   Tetracaine Hcl     Other reaction(s): Unknown. Thinks caused itching.   Tetracyclines & Related Hives   Tramadol     Other reaction(s): itch    Patient Measurements: Height: 5' 5"  (165.1 cm) Weight: 95.3 kg (210 lb 1.6 oz) IBW/kg (Calculated) : 57  Vital Signs: Temp: 98.9 F (37.2 C) (01/08 1200) Temp Source: Axillary (01/08 1200) BP: 121/82 (01/08 1200) Pulse Rate: 114 (01/08 1200)  Labs: Recent Labs    11/02/21 1439 11/02/21 1800 11/03/21 0447 11/03/21 0737 11/03/21 1600 11/03/21 1700 11/04/21 0444 11/04/21 0539  HGB 8.1*  --   --  7.9*  --   --  8.5*  --   HCT 24.7*  --   --  24.0*  --   --  24.9*  --   PLT PLATELET CLUMPS NOTED ON SMEAR, UNABLE TO ESTIMATE   < > 51* 48*  --  33* 28* 28*  APTT  --    < > 28  --   --  26  --  31  LABPROT  --    < > 15.7*  --   --  15.5*  --  14.7  INR  --    < > 1.3*  --   --  1.2  --  1.1  CREATININE  --    < > 2.70*   2.71*  --  2.48*  --   --  2.43*   < > = values in this interval not displayed.     Estimated Creatinine Clearance: 23.9 mL/min (A) (by C-G formula based on SCr of 2.43 mg/dL (H)).   Medical History: Past Medical History:  Diagnosis Date   Anxiety    Arthritis    Asthma    Depression    Diabetes mellitus without complication (HCC)     Fibromyalgia    GERD (gastroesophageal reflux disease)    Hyperlipemia    Hypertension    PONV (postoperative nausea and vomiting)     Medications:   Scheduled:   sodium chloride   Intravenous Once   sodium chloride   Intravenous Once   B-complex with vitamin C  1 tablet Per Tube Daily   chlorhexidine gluconate (MEDLINE KIT)  15 mL Mouth Rinse BID   Chlorhexidine Gluconate Cloth  6 each Topical Daily   docusate  100 mg Per Tube BID   hydrocortisone sod succinate (SOLU-CORTEF) inj  100 mg Intravenous BID   insulin aspart  1-3 Units Subcutaneous Q4H   mouth rinse  15 mL Mouth Rinse 10 times per day   pantoprazole (PROTONIX) IV  40 mg Intravenous Q12H   polyethylene glycol  17 g Per Tube Daily   QUEtiapine  50 mg Per Tube QHS   sodium  chloride flush  10-40 mL Intracatheter Q12H   sodium chloride flush  10-40 mL Intracatheter Q12H    Assessment: 71 yo female with saddle PE - s/p thrombectomy and started on heparin - then stopped after intraperitoneal bleed.   Patient with low pltc 28, D-Dimer back up >20, fibrinogen back up 500 and CRRT circuit clotting Resume low dose heparin drip no bolus and up titrate slowly   Goal of Therapy:  Heparin level 0.3-0.5 units/ml Monitor platelets by anticoagulation protocol: Yes   Plan:  Heparin drip 600 uts/hr  Heparin level and CBC 6hr after restart Daily heparin level and CBC  Monitor s/s bleeding    Bonnita Nasuti Pharm.D. CPP, BCPS Clinical Pharmacist [phone] 11/04/2021 12:36 PM

## 2021-11-04 NOTE — Progress Notes (Signed)
Pharmacy Antibiotic Note  [name] is a 71 y.o. female admitted on 02/27/8412 with PE complicated by intraperitoneal bleed, and VDRF. She has been treated with vancomycin  and cefepime.   WBC increasing 18, Tm increased 100, increased respiratory secretions.  Pharmacy consulted to broaden therapy to vancomycin and meropenem after new Cx drawn  Currently on CRRT.  Plan: Stop cefepime  Meropenem 1gm q8h - on CRRT Continue Vancomycin 1g q 24 hrs Order another vancomycin random as clinically indicated last trough 17 1/5  Height: 5\' 5"  (165.1 cm) Weight: 95.3 kg (210 lb 1.6 oz) IBW/kg (Calculated) : 57  Temp (24hrs), Avg:98.2 F (36.8 C), Min:97.5 F (36.4 C), Max:99.3 F (37.4 C)  Recent Labs  Lab 10/30/21 1057 10/30/21 1357 11/01/21 0415 11/01/21 0751 11/01/21 1411 11/01/21 1734 11/01/21 2018 11/02/21 0219 11/02/21 0502 11/02/21 0757 11/02/21 1439 11/02/21 1800 11/03/21 0447 11/03/21 0737 11/03/21 1600 11/04/21 0444 11/04/21 0539  WBC  --    < > 17.6*   < > 15.8*  --  16.5* 16.4*  --  15.8* 16.0*  --   --  16.0*  --  18.7*  --   CREATININE  --    < > 4.44*   4.44*  --  3.58*   < >  --   --  3.00*  --   --  2.87* 2.70*   2.71*  --  2.48*  --  2.43*  LATICACIDVEN  --    < >  --   --  2.7*  --  2.2* 1.9  --  2.0* 1.8  --   --   --   --   --   --   VANCORANDOM 21  --  17  --   --   --   --   --   --   --   --   --   --   --   --   --   --    < > = values in this interval not displayed.     Estimated Creatinine Clearance: 23.9 mL/min (A) (by C-G formula based on SCr of 2.43 mg/dL (H)).    Allergies  Allergen Reactions   Aspirin Nausea And Vomiting    Other reaction(s): Unknown   Benadryl [Diphenhydramine]     Can only take dye free. States the dye causes itching.   Darvon [Propoxyphene] Itching    Can only during the day. Night time makes her itch   Hydrocodone Bit-Homatrop Mbr Itching   Metformin     Other reaction(s): GI   Other     Other reaction(s):  Unknown. States reaction was to nasal spray that caused pupils to shrink   Pentazocine     nervous Other reaction(s): jitters   Percocet [Oxycodone-Acetaminophen]     itching   Sulfa Antibiotics Hives   Tetracaine Hcl     Other reaction(s): Unknown. Thinks caused itching.   Tetracyclines & Related Hives   Tramadol     Other reaction(s): itch    Antimicrobials this admission: Vanc 1/1 >> Cefepime 1/1 >> 1/8 Meropenem 1/8> Flagyl 1/4 >> 1/8   Microbiology results: 1/1 BCx x 2 >> canceled 1/1 Flu/Covid negative 1/3 resp cx  GPC, moderate yeast > few candida albicans 1/3 Bcx ngF 1/8 TA ip 1/8 Bcx ip    Bonnita Nasuti Pharm.D. CPP, BCPS Clinical Pharmacist [phone] 11/04/2021 12:23 PM   [facility] pharmacy phone numbers are listed on [email or web address]

## 2021-11-05 DIAGNOSIS — Z9911 Dependence on respirator [ventilator] status: Secondary | ICD-10-CM | POA: Diagnosis not present

## 2021-11-05 DIAGNOSIS — I469 Cardiac arrest, cause unspecified: Secondary | ICD-10-CM | POA: Diagnosis not present

## 2021-11-05 LAB — GLUCOSE, CAPILLARY
Glucose-Capillary: 210 mg/dL — ABNORMAL HIGH (ref 70–99)
Glucose-Capillary: 167 mg/dL — ABNORMAL HIGH (ref 70–99)
Glucose-Capillary: 156 mg/dL — ABNORMAL HIGH (ref 70–99)
Glucose-Capillary: 151 mg/dL — ABNORMAL HIGH (ref 70–99)
Glucose-Capillary: 113 mg/dL — ABNORMAL HIGH (ref 70–99)
Glucose-Capillary: 161 mg/dL — ABNORMAL HIGH (ref 70–99)
Glucose-Capillary: 193 mg/dL — ABNORMAL HIGH (ref 70–99)

## 2021-11-05 LAB — CBC
HCT: 28.4 % — ABNORMAL LOW (ref 36.0–46.0)
Hemoglobin: 9.5 g/dL — ABNORMAL LOW (ref 12.0–15.0)
MCH: 29.7 pg (ref 26.0–34.0)
MCHC: 33.5 g/dL (ref 30.0–36.0)
MCV: 88.8 fL (ref 80.0–100.0)
Platelets: 17 10*3/uL — CL (ref 150–400)
RBC: 3.2 MIL/uL — ABNORMAL LOW (ref 3.87–5.11)
RDW: 17.5 % — ABNORMAL HIGH (ref 11.5–15.5)
WBC: 23.6 10*3/uL — ABNORMAL HIGH (ref 4.0–10.5)
nRBC: 2.3 % — ABNORMAL HIGH (ref 0.0–0.2)

## 2021-11-05 LAB — DIC (DISSEMINATED INTRAVASCULAR COAGULATION)PANEL
D-Dimer, Quant: >20 ug/mL-FEU — ABNORMAL HIGH (ref 0.00–0.50)
Fibrinogen: 601 mg/dL — ABNORMAL HIGH (ref 210–475)
INR: 1.3 — ABNORMAL HIGH (ref 0.8–1.2)
Platelets: 16 10*3/uL — CL (ref 150–400)
Prothrombin Time: 16 seconds — ABNORMAL HIGH (ref 11.4–15.2)
Smear Review: NONE SEEN
aPTT: 46 seconds — ABNORMAL HIGH (ref 24–36)

## 2021-11-05 LAB — RENAL FUNCTION PANEL
Albumin: 1.8 g/dL — ABNORMAL LOW (ref 3.5–5.0)
Albumin: 2 g/dL — ABNORMAL LOW (ref 3.5–5.0)
Anion gap: 12 (ref 5–15)
Anion gap: 16 — ABNORMAL HIGH (ref 5–15)
BUN: 37 mg/dL — ABNORMAL HIGH (ref 8–23)
BUN: 37 mg/dL — ABNORMAL HIGH (ref 8–23)
CO2: 22 mmol/L (ref 22–32)
CO2: 17 mmol/L — ABNORMAL LOW (ref 22–32)
Calcium: 7.3 mg/dL — ABNORMAL LOW (ref 8.9–10.3)
Calcium: 7.3 mg/dL — ABNORMAL LOW (ref 8.9–10.3)
Chloride: 103 mmol/L (ref 98–111)
Chloride: 103 mmol/L (ref 98–111)
Creatinine, Ser: 2.32 mg/dL — ABNORMAL HIGH (ref 0.44–1.00)
Creatinine, Ser: 2.25 mg/dL — ABNORMAL HIGH (ref 0.44–1.00)
GFR, Estimated: 22 mL/min — ABNORMAL LOW (ref >60)
GFR, Estimated: 23 mL/min — ABNORMAL LOW (ref >60)
Glucose, Bld: 151 mg/dL — ABNORMAL HIGH (ref 70–99)
Glucose, Bld: 170 mg/dL — ABNORMAL HIGH (ref 70–99)
Phosphorus: 2.6 mg/dL (ref 2.5–4.6)
Phosphorus: 2.4 mg/dL — ABNORMAL LOW (ref 2.5–4.6)
Potassium: 3.4 mmol/L — ABNORMAL LOW (ref 3.5–5.1)
Potassium: 3.5 mmol/L (ref 3.5–5.1)
Sodium: 137 mmol/L (ref 135–145)
Sodium: 136 mmol/L (ref 135–145)

## 2021-11-05 LAB — MAGNESIUM: Magnesium: 2.4 mg/dL (ref 1.7–2.4)

## 2021-11-05 LAB — HEPARIN LEVEL (UNFRACTIONATED)
Heparin Unfractionated: <0.1 IU/mL — ABNORMAL LOW (ref 0.30–0.70)
Heparin Unfractionated: <0.1 IU/mL — ABNORMAL LOW (ref 0.30–0.70)

## 2021-11-05 LAB — MRSA NEXT GEN BY PCR, NASAL: MRSA by PCR Next Gen: NOT DETECTED

## 2021-11-05 MED ORDER — LABETALOL HCL 5 MG/ML IV SOLN: 10.0000 mg | INTRAVENOUS | Status: DC | PRN

## 2021-11-05 MED ORDER — VITAL AF 1.2 CAL PO LIQD: 1000.0000 mL | ORAL | Status: DC

## 2021-11-05 MED ORDER — NOREPINEPHRINE 4 MG/250ML-% IV SOLN
0.0000 ug/min | INTRAVENOUS | Status: DC
  Administered 2021-11-05: 2 ug/min via INTRAVENOUS
  Administered 2021-11-06: 8 ug/min via INTRAVENOUS
  Administered 2021-11-06: 10 ug/min via INTRAVENOUS
  Administered 2021-11-08: 2 ug/min via INTRAVENOUS
  Filled 2021-11-05 (×3): qty 250

## 2021-11-05 MED ORDER — ATORVASTATIN CALCIUM 10 MG PO TABS
20.0000 mg | ORAL_TABLET | Freq: Every day | ORAL | Status: DC
  Administered 2021-11-05 – 2021-11-06 (×2): 20 mg
  Filled 2021-11-05 (×2): qty 2

## 2021-11-05 MED ORDER — PRISMASOL BGK 4/2.5 32-4-2.5 MEQ/L EC SOLN: Status: DC

## 2021-11-05 MED ORDER — NOREPINEPHRINE 4 MG/250ML-% IV SOLN
INTRAVENOUS | Status: AC
  Filled 2021-11-05: qty 250

## 2021-11-05 MED ORDER — VITAL AF 1.2 CAL PO LIQD
1000.0000 mL | ORAL | Status: DC
  Administered 2021-11-05: 1000 mL

## 2021-11-05 MED ORDER — PROSOURCE TF PO LIQD
90.0000 mL | Freq: Every day | ORAL | Status: DC
  Administered 2021-11-05 – 2021-11-06 (×6): 90 mL
  Filled 2021-11-05 (×5): qty 90

## 2021-11-05 MED ORDER — LABETALOL HCL 5 MG/ML IV SOLN
INTRAVENOUS | Status: AC
  Administered 2021-11-05: 10 mg via INTRAVENOUS
  Filled 2021-11-05: qty 4

## 2021-11-05 MED ORDER — HYDRALAZINE HCL 20 MG/ML IJ SOLN
5.0000 mg | INTRAMUSCULAR | Status: DC | PRN
  Administered 2021-11-05: 5 mg via INTRAVENOUS
  Filled 2021-11-05: qty 1

## 2021-11-05 NOTE — Progress Notes (Signed)
[name] Progress Note     Assessment/ Plan:   Renal failure secondary to ATN in the setting of cardiac arrest, hypotension, pressors, contrast.  BL CKD3A with BL creatinine in the 1.17-1.3 range in late 2021. - R fem HD cath - started CRRT 10/31/20 - change to all 4K bath--> K 3.4 this AM - UF goal 200-400 mL/ min net neg as BP allows   Prolonged VF cardiac arrest--> OOH VF arrest 2/2 massive PE Massive PE s/p mechanical thrombectomy 1/2 of R and L PA Retroperitoneal bleed/Hemorrhagic shock/Acute blood loss anemia originating from left liver lobe s/p embolization 1/4--> required FFP, plts, Vit K, pRBCs Thrombocytopenia- DIC vs HIT vs other; on hep gtt d/t #3, per primary DM HTN Acute hypoxic RF- on vent Sepsis/ leukocytosis- on meropenem, cultures pending, WBC ct increasing Dispo: ICU  Subjective:   Filter clotting -> heparin 600/hr; mental status worse this am.    Objective:   BP (!) 162/65    Pulse 92    Temp 99.3 F (37.4 C) (Axillary)    Resp (!) 25    Ht _0  (1.651 m)    Wt 88 kg    SpO2 100%    BMI 32.28 kg/m   Intake/Output Summary (Last 24 hours) at 11/05/2021 0900 Last data filed at 11/05/2021 0700 Gross per 24 hour  Intake 662.5 ml  Output 5582 ml  Net -4919.5 ml   Weight change: -7.3 kg  Physical Exam: GEN: on Vent, moves arms HEENT:  eyes closed NECK: Supple, no thyromegaly LUNGS: CTA on vent CV: Tachy ABD: hypoactive bowel sounds EXT: 2+ anasarca ACCESS: L fem nontunneled HD cath   Imaging: CT HEAD WO CONTRAST (5MM)  Result Date: 11/04/2021 CLINICAL DATA:  Stroke, hemorrhagic Additional history: Patient is status post cardiac arrest. EXAM: CT HEAD WITHOUT CONTRAST TECHNIQUE: Contiguous axial images were obtained from the base of the skull through the vertex without intravenous contrast. COMPARISON:  CT head 10/28/2021. FINDINGS: Motion limited study. Brain: No evidence of acute large vascular territory infarction, hemorrhage,  hydrocephalus, extra-axial collection or mass lesion/mass effect. Vascular: No hyperdense vessel identified. Skull: No acute fracture. Sinuses/Orbits: Clear sinuses.  Unremarkable orbits. Other: No mastoid effusions IMPRESSION: No evidence of acute intracranial abnormality on this motion limited study. MRI could provide more sensitive evaluation for hypoxic/ischemic injury if clinically indicated. Electronically Signed   By: Margaretha Sheffield M.D.   On: 11/04/2021 14:40   EEG adult  Result Date: 11/04/2021 Greta Doom, MD     11/04/2021  3:33 PM History: 71 yo F with encephalopathy Sedation: fentanyl prn Technique: This EEG was acquired with electrodes placed according to the International 10-20 electrode system (including Fp1, Fp2, F3, F4, C3, C4, P3, P4, O1, O2, T3, T4, T5, T6, A1, A2, Fz, Cz, Pz). The following electrodes were missing or displaced: none. Background: The background consists predominantly of generalized irregular delta activity.  There is some superimposed irregular theta as well.  There is no clear posterior dominant rhythm.  There was no epileptiform discharges seen.  Clear sleep structures were not seen. Photic stimulation: Physiologic driving is now performed EEG Abnormalities: 1) generalized irregular slow activity 2) absent posterior dominant rhythm Clinical Interpretation: This EEG is consistent with a generalized non-specific cerebral dysfunction(encephalopathy). There was no seizure or seizure predisposition recorded on this study. Please note that lack of epileptiform activity on EEG does not preclude the possibility of epilepsy. Roland Rack, MD [facility] [phone] If 7pm- 7am, please page neurology  on call as listed in [place].    Labs: BMET Recent Labs  Lab 11/02/21 0502 11/02/21 0519 11/02/21 1800 11/03/21 0447 11/03/21 1600 11/04/21 0539 11/04/21 1606 11/05/21 0500  NA 135 135 132* 133*   133* 134* 134* 133* 137  K 4.6 4.5 4.4 4.6    4.6 3.9 3.8 3.6 3.4*  CL 102  --  100 102   102 100 101 101 103  CO2 21*  --  18* 20*   20* 22 21* 18* 22  GLUCOSE 158*  --  245* 245*   247* 237* 177* 147* 151*  BUN 32*  --  35* 36*   37* 38* 36* 37* 37*  CREATININE 3.00*  --  2.87* 2.70*   2.71* 2.48* 2.43* 2.42* 2.32*  CALCIUM 7.1*  --  7.4* 7.5*   7.4* 7.4* 7.5* 7.3* 7.3*  PHOS 4.5  --  5.0* 4.9* 3.5 2.5 2.6 2.6   CBC Recent Labs  Lab 10/30/21 0100 10/30/21 0400 10/30/21 0803 10/30/21 0938 10/30/21 1356 10/30/21 1357 10/30/21 1721 11/02/21 1439 11/02/21 1800 11/03/21 0737 11/03/21 1700 11/04/21 0444 11/04/21 0539 11/04/21 1606 11/05/21 0500  WBC 19.4* 21.4*  --  20.2*  --  21.2*   < > 16.0*  --  16.0*  --  18.7*  --  20.2*  --   NEUTROABS 16.5* 18.2*  --  17.3*  --  17.9*  --   --   --   --   --   --   --   --   --   HGB 10.5* 9.8*   < > 9.5*   < > 8.3*   < > 8.1*  --  7.9*  --  8.5*  --  8.5*  --   HCT 29.3* 28.9*   < > 27.0*   < > 22.4*   < > 24.7*  --  24.0*  --  24.9*  --  25.8*  --   MCV 77.9* 78.5*  --  78.5*  --  77.8*   < > 87.6  --  87.9  --  86.8  --  86.9  --   PLT 101* 101*   102*  --  92*  --  100*   < > PLATELET CLUMPS NOTED ON SMEAR, UNABLE TO ESTIMATE   < > 48*   < > 28* 28* 26*   26* 16*   < > = values in this interval not displayed.    Medications:     sodium chloride   Intravenous Once   sodium chloride   Intravenous Once   B-complex with vitamin C  1 tablet Per Tube Daily   chlorhexidine gluconate (MEDLINE KIT)  15 mL Mouth Rinse BID   Chlorhexidine Gluconate Cloth  6 each Topical Daily   docusate  100 mg Per Tube BID   insulin aspart  1-3 Units Subcutaneous Q4H   mouth rinse  15 mL Mouth Rinse 10 times per day   pantoprazole (PROTONIX) IV  40 mg Intravenous Q12H   QUEtiapine  50 mg Per Tube QHS   sodium chloride flush  10-40 mL Intracatheter Q12H   sodium chloride flush  10-40 mL Intracatheter Q12H      Madelon Lips, MD 11/05/2021, 9:00 AM

## 2021-11-05 NOTE — Plan of Care (Signed)
  Problem: Clinical Measurements: Goal: Respiratory complications will improve Outcome: Progressing Goal: Cardiovascular complication will be avoided Outcome: Progressing   

## 2021-11-05 NOTE — Progress Notes (Signed)
[place] for heparin  Indication: pulmonary embolus  Allergies  Allergen Reactions   Aspirin Nausea And Vomiting    Other reaction(s): Unknown   Benadryl [Diphenhydramine]     Can only take dye free. States the dye causes itching.   Darvon [Propoxyphene] Itching    Can only during the day. Night time makes her itch   Hydrocodone Bit-Homatrop Mbr Itching   Metformin     Other reaction(s): GI   Other     Other reaction(s): Unknown. States reaction was to nasal spray that caused pupils to shrink   Pentazocine     nervous Other reaction(s): jitters   Percocet [Oxycodone-Acetaminophen]     itching   Sulfa Antibiotics Hives   Tetracaine Hcl     Other reaction(s): Unknown. Thinks caused itching.   Tetracyclines & Related Hives   Tramadol     Other reaction(s): itch    Patient Measurements: Height: 5\' 5"  (165.1 cm) Weight: 88 kg (194 lb 0.1 oz) IBW/kg (Calculated) : 57  Vital Signs: Temp: 98.7 F (37.1 C) (01/09 2000) Temp Source: Oral (01/09 2000) BP: 110/67 (01/09 2130) Pulse Rate: 98 (01/09 2130)  Labs: Recent Labs    11/04/21 0444 11/04/21 0539 11/04/21 1606 11/04/21 1754 11/05/21 0500 11/05/21 0836 11/05/21 1631 11/05/21 2000  HGB 8.5*  --  8.5*  --   --  9.5*  --   --   HCT 24.9*  --  25.8*  --   --  28.4*  --   --   PLT 28* 28* 26*   26*  --  16* 17*  --   --   APTT  --  31 34  --  46*  --   --   --   LABPROT  --  14.7 15.9*  --  16.0*  --   --   --   INR  --  1.1 1.3*  --  1.3*  --   --   --   HEPARINUNFRC  --   --   --  <0.10* <0.10*  --   --  <0.10*  CREATININE  --  2.43* 2.42*  --  2.32*  --  2.25*  --      Estimated Creatinine Clearance: 24.8 mL/min (A) (by C-G formula based on SCr of 2.25 mg/dL (H)).   Medical History: Past Medical History:  Diagnosis Date   Anxiety    Arthritis    Asthma    Depression    Diabetes mellitus without complication (HCC)    Fibromyalgia    GERD (gastroesophageal reflux  disease)    Hyperlipemia    Hypertension    PONV (postoperative nausea and vomiting)     Assessment: 71 yo female with saddle PE - s/p thrombectomy and started on heparin - then stopped after intraperitoneal bleed.   Patient with low pltc 20s, D-Dimer back up >20, fibrinogen back up 570 and CRRT circuit clotting at least once per shift. Resume low dose heparin drip no bolus and up titrate slowly.  Heparin level undetectable x3 now on 850 units/hr. Hgb stable around 9.5 and plt count down to 17 this morning (planning platelet transfusion for <10). Will be conservative on dose adjustments with low platelet count. No signs or symptoms of bleeding.  Goal of Therapy:  Heparin level 0.3-0.5 units/ml Monitor platelets by anticoagulation protocol: Yes   Plan:  Increase Heparin drip 1000 units/hr Heparin level and CBC in am Monitor s/s bleeding  Erin Hearing PharmD., BCPS Clinical Pharmacist 11/05/2021 9:59 PM

## 2021-11-05 NOTE — Progress Notes (Signed)
eLink Physician-Brief Progress Note Patient Name: [name] DOB: 06/26/1951 MRN: [id number]   Date of Service  11/05/2021  HPI/Events of Note  Pt was taken off sedation yesterday because of AMS but pt is now moving in bed, and BP is going up to 200/76 9108)   HR 114 which she has been tachy, still has PRN fent on MAR, was wondering if it would be ok to see if 25 mcq would help, still has precedex on board, On CRRT pulling 350 off,   no pressors.  Complicated course, massive PE arrest, In tra peritoneal bleeding, s/p embolization, EEG no sz , cerebral dysfunction. CTH neg.     eICU Interventions  Labetolol 10 mg q2 hr for SBP > 180 and or DBP > 100.      Intervention Category Intermediate Interventions: Other: (agitation)  Elmer Sow 11/05/2021, 12:56 AM

## 2021-11-05 NOTE — Progress Notes (Signed)
[place] for heparin  Indication: pulmonary embolus  Allergies  Allergen Reactions   Aspirin Nausea And Vomiting    Other reaction(s): Unknown   Benadryl [Diphenhydramine]     Can only take dye free. States the dye causes itching.   Darvon [Propoxyphene] Itching    Can only during the day. Night time makes her itch   Hydrocodone Bit-Homatrop Mbr Itching   Metformin     Other reaction(s): GI   Other     Other reaction(s): Unknown. States reaction was to nasal spray that caused pupils to shrink   Pentazocine     nervous Other reaction(s): jitters   Percocet [Oxycodone-Acetaminophen]     itching   Sulfa Antibiotics Hives   Tetracaine Hcl     Other reaction(s): Unknown. Thinks caused itching.   Tetracyclines & Related Hives   Tramadol     Other reaction(s): itch    Patient Measurements: Height: 5\' 5"  (165.1 cm) Weight: 88 kg (194 lb 0.1 oz) IBW/kg (Calculated) : 57  Vital Signs: Temp: 98.1 F (36.7 C) (01/09 0800) Temp Source: Oral (01/09 0800) BP: 113/83 (01/09 0915) Pulse Rate: 98 (01/09 1130)  Labs: Recent Labs    11/04/21 0444 11/04/21 0539 11/04/21 1606 11/04/21 1754 11/05/21 0500 11/05/21 0836  HGB 8.5*  --  8.5*  --   --  9.5*  HCT 24.9*  --  25.8*  --   --  28.4*  PLT 28* 28* 26*   26*  --  16* 17*  APTT  --  31 34  --  46*  --   LABPROT  --  14.7 15.9*  --  16.0*  --   INR  --  1.1 1.3*  --  1.3*  --   HEPARINUNFRC  --   --   --  <0.10* <0.10*  --   CREATININE  --  2.43* 2.42*  --  2.32*  --      Estimated Creatinine Clearance: 24 mL/min (A) (by C-G formula based on SCr of 2.32 mg/dL (H)).   Medical History: Past Medical History:  Diagnosis Date   Anxiety    Arthritis    Asthma    Depression    Diabetes mellitus without complication (HCC)    Fibromyalgia    GERD (gastroesophageal reflux disease)    Hyperlipemia    Hypertension    PONV (postoperative nausea and vomiting)     Assessment: 71 yo  female with saddle PE - s/p thrombectomy and started on heparin - then stopped after intraperitoneal bleed.   Patient with low pltc 20s, D-Dimer back up >20, fibrinogen back up 570 and CRRT circuit clotting at least once per shift. Resume low dose heparin drip no bolus and up titrate slowly.  Heparin level undetectable on 750 units/hr. Hgb stable around 9.5 and plt count stable at 17 with platelet transfusion for <10. Will be conservative on dose adjustments with low platelet count. No signs or symptoms of bleeding.  Goal of Therapy:  Heparin level 0.3-0.5 units/ml Monitor platelets by anticoagulation protocol: Yes   Plan:  Increase Heparin drip 850 uts/hr  Heparin level in 8h and CBC in am Monitor s/s bleeding    Varney Daily, PharmD PGY1 Pharmacy Resident  Please check AMION for all [facility] pharmacy phone numbers After 10:00 PM call main pharmacy [phone]

## 2021-11-05 NOTE — Progress Notes (Addendum)
Nutrition Follow-up  DOCUMENTATION CODES:   Not applicable  INTERVENTION:   Plan for [place] when able  Tube Feeding via OG: Per MD, continue Vital AF 1.2 at 10 ml/hr, no titration. If tolerates, plan for titration tomorrow Goal TF regimen: Vital AF 1.2 at 65 ml/hr Goal rate provides 108 g of protein, 1728 kcals, 1166 mL of free water  Add Pro-Source TF 90 mL x 5 time daily; each packet provides 11 g of protein and 40 kcals  MD does not want to initiate TPN today; plan for repeat trial of reglan, advancement of tube as able if unable to tolerate TF advancement If unable to tolerate EN with advancement to goal, recommend initiation of supplemental TPN while continuing trials of EN Despite having adequate nutrition prior to admission, Pt meets criteria for supplemental TPN initiation at this time given high nutritional risk with inability to tolerate TF at goal since admission (8 days)-inadequate nutrition for more than 7 days places pt at nutritional risk.  [facility]EN recommendations: "Initiate TPN after 7 days for well-nourished, stable adult patients who have been unable to receive significant oral or EN (>/= 50% of estimated requirements).    Pt with increased needs related to critical illness as well as nutrient losses via CRRT. Pt still acutely ill but has improved from a stability standpoint; off pressors since 1/07, Hgb stable            NUTRITION DIAGNOSIS:   Inadequate oral intake related to acute illness as evidenced by NPO status.  Being addressed via TF   GOAL:   Patient will meet greater than or equal to 90% of their needs  Progressing  MONITOR:   Vent status, Labs, Weight trends, TF tolerance  REASON FOR ASSESSMENT:   Ventilator    ASSESSMENT:   71 yo female admitted with acute encephalopathy post OOH prolonged V.fib arrest with multiorgan failure with massive saddle PE and DIC, AKI. PMH includes DM, HTN, HLD, GERD, anxiety, depression, fibromyalgia    1/01  Admitted, VF arrest, Intubated, Concern for anoxic injury 1/02 Saddle PE s/p Mechanical thrombectomy 1/03 Hemodynamics improved post thrombectomy, weaning pressors, Hgb table, bleeding reduced, started trickle TF. Later in day, pressor requirement increased with Hgb drop to around 5 and found to have large intraperitoneal/intraparenchymal hemorrhage extending into left subscapular hematoma. Pt received PRBC x 5, FFP x 5, Platelets x 1 1/05 acute hemorrhage from left hepatic lob s/p Left hepatic lobe embolization, TF resumed at trickle 1/06 Episode of emesis after TF increased to 65 ml/hr 1/09 TF held all weekend, resuming trickle TF today  Remains on vent support Remains on CRRT-UF goal 200-400 mL/hr  Cortrak ordered for today (post plyoric); platelets <20, high risk for bleeding. TF on hold, MD placed order to resume TF today at 10 ml/hr.  Noted pt with episode of emesis on Friday evening after TF rate increased from trickle to 65 ml/hr. Orders were actually to increase from 20 to 30 ml/hr with plans to titrate by 10 mL q 8 hours until goal rate (these orders were not followed)  Discussed nutrition poc with MD Carlis Abbott. NPO/Trickle TF since admission. Restarting trickle TF today after being TF being stopped on Friday 1/6 after epsiode of emesis and was not restarted over the weekend. Recommend considering TPN given prolonged period of minimal nutrition in a patient with increased needs related to critical illness, CRRT. MD does not want to start TPN at this time. Plan to continue trickle TF only today, if tolerates  plan to begin titration tomorrow. Per RN, pt received reglan over the weekend without resumption of TF but reglan now on hold after pt started having diarrhea +BM after previous period of constipation  Pt with best chance of tolerating TF now given off pressors, +bowel function with +BM, abdomen soft, Hgb stable.  Labs: potassium 3.4 (L), Creatinine 2.32, CBG 137-161 Meds: colace, B  complex with C, ss novolog   Diet Order:   Diet Order             Diet NPO time specified  Diet effective now                   EDUCATION NEEDS:   Not appropriate for education at this time  Skin:  Skin Assessment: Reviewed RN Assessment  Last BM:  1/09  Height:   Ht Readings from Last 1 Encounters:  10/28/21 5\' 5"  (1.651 m)    Weight:   Wt Readings from Last 1 Encounters:  11/05/21 88 kg     BMI:  Body mass index is 32.28 kg/m.  Estimated Nutritional Needs:   Kcal:  5638-9373 kcals  Protein:  85-115 g  Fluid:  >/= 1.8 L  Kerman Passey MS, RDN, LDN, CNSC Registered Dietitian III Clinical Nutrition RD Pager and On-Call Pager Number Located in [place]

## 2021-11-05 NOTE — Progress Notes (Signed)
NAME:  [name], MRN:  [id number], DOB:  Feb 13, 1951, LOS: 8 ADMISSION DATE:  10/28/2021, CONSULTATION DATE: 10/28/21 REFERRING MD: Dr. Sherry Ruffing, CHIEF COMPLAINT:  Cardiac Arrest, VF   History of Present Illness:  [name] presenting with prolonged OOH VF arrest secondary to massive pulmonary embolus complicated by DIC.  Pertinent  Medical History  Anxiety / Depression  Fibromyalgia  Asthma  DM  GERD  HTN  HLD   Significant Hospital Events: Including procedures, antibiotic start and stop dates in addition to other pertinent events   1/1 Admit post VF arrest  1/2 S/p mechanical thrombectomy of right and left PA 1/3 Weaned to low dose epi and levo. In the evening increased pressor requirement and Hg drop to ~5. Found with large intraperitoneal/intraparenchymal hemorrhage extending into left subcapsular hematoma. Received PRBC x 5, FFP x 5 and Plt x 1 1/4 Overnight worsening pressor requirement and Hg ~4. Transfused PRBC x 3, FFP x 2 and platelet x 2. S/p embolization of left hepatic lobe 1/8 meropenem and vanc started, CT head WNL  Interim History / Subjective:  Waking up some, able to follow simple commands. Off CRRT yesterday due to clotting issues, but back on this morning and tolerating high flow rates. Still titrating heparin.  Objective   Blood pressure (!) 162/65, pulse 92, temperature 99.3 F (37.4 C), temperature source Axillary, resp. rate (!) 25, height 5\' 5"  (1.651 m), weight 88 kg, SpO2 100 %.    Vent Mode: PSV;CPAP FiO2 (%):  [40 %] 40 % Set Rate:  [22 bmp] 22 bmp Vt Set:  [490 mL] 490 mL PEEP:  [5 cmH20] 5 cmH20 Pressure Support:  [5 cmH20] 5 cmH20 Plateau Pressure:  [18 cmH20] 18 cmH20   Intake/Output Summary (Last 24 hours) at 11/05/2021 0733 Last data filed at 11/05/2021 0700 Gross per 24 hour  Intake 663.37 ml  Output 5833 ml  Net -5169.63 ml    Filed Weights   11/03/21 0600 11/04/21 0500 11/05/21 0500  Weight: 96.5 kg 95.3 kg 88 kg   Physical  Exam: General: critically ill appearing woman lying in bed intubated, sedated HEENT:  [place]/AT, eyes anicteric Respiratory:  mild tachypnea, controlled WOB on 5/5. Minimal blood tinged sputum Cardiovascular: S1S2 GI: soft, TTP Extremities: + pitting edema, no cyanosis Neuro: opening her eyes to stimulation, RASS -4, moving extremities purposefully spontaneously but not consistently following commands.   Ancillary tests personally reviewed:  EEG: generalized irreg slow activity, absent posterior dominant rhythm-- c/w generalized cerebral dysfunction. No epileptiform activity.  K+ 3.4 BUN 37 Cr 2.32  no schistocytes on smear Platelets 16 D-dimer >20 Fibrinogen 601 Heparin <0.1  CT head > no acute abnormalities  Resp culture> rare WBCs Blood culture 1/8>pending  Assessment & Plan:  Acute respiratory failure with hypoxia Acute massive saddle PE Aspiration pneumonia -LTVV, 4-8cc/kg IBW with goal Pplat <30 and DP<15 -VAP prevention protocol -PAD protocol for sedation, minimize sedation. This morning mental status would preclude a safe extubation. -Goal net neg volume status. Needs more volume off. -heparin for PE -Con't empiric vanc, meropenem & deescalate based on cultures. Repeat MRSA nares.   VF arrest due to massive PE (unprovoked, but concerning liver mass), preceded by presyncopal episode -supportive care -heparin for PE; avoiding switching to bivalirudin right now due to concern for CRRT interruptions when he AC risk is already so high with such a low platelet count  Acute metabolic encephalopathy in setting of arrest, improving -limit sedation where able; goal RASS 0  Unprovoked massive pulmonary embolism with consumptive coagulopathy. Has never had schistocytes on smear this admission. Initial CBC in the ED was diluted specimen. Subsequently began having intraabdominal bleeding on 1/2, which is when her platelet count began to fall. She has not received matched  transfusions this admission due to clotting concerns, so she remains in a diluted state.  Hemorraghic shock due to hepatic bleeding on 1/2- 1/3; s/p 6 U PRBC (+2 more in the ED), FFP x 5 (1 was given at admission) and Plt x 3 (+ 2 in the ED at admission). Liver embolized 1/4. -monitor for bleeding -control HTN to SBP <160 to limit additional bleeding risk with low platelet count   Obstructive shock secondary to PE- resolved. Now hypertensive.  -hydralazine PRN for SBT>160  Acute renal failure, related to low perfusion during arrest - -CRRT per nephrology -renally dose meds, avoid nephrotoxic meds -strict I/Os -RFP BID  DM II with controlled hyperglycemia, no DKA -SSI PRN -stop stress dose steroids -goal BG 140-180  Anion gap acidosis - resolved -monitor   Hypocalcemia due to renal failure, blood transfusions -monitor  Hypomagnesemia Hypokalemia -monitor -will equilibrate with dialysate  At risk for malnutrition, previously had ovmiting with TF -resume TF at trickle rate, if tolerating can increase tomorrow. -if not tolerating, will try reglan  HLD -start atorvastatin (on simvastatin PTA)  GERD -on PPI PTA, con't  Anxiety, Depression, Fibromyalgia  -can resume meds once extubated, worry about worsening encephaloapthy at this point  Liver lesions -MRI needed to better define if she recovers  Daughter updated at bedside during rounds.   Best Practice (right click and "Reselect all SmartList Selections" daily)  Diet/type: tubefeeds DVT prophylaxis: systemic heparin GI prophylaxis: PPI Lines: Central line Foley:  N/A Code Status:  full code Last date of multidisciplinary goals of care discussion: 1/9, continue aggressive care    This patient is critically ill with multiple organ system failure which requires frequent high complexity decision making, assessment, support, evaluation, and titration of therapies. This was completed through the application of advanced  monitoring technologies and extensive interpretation of multiple databases. During this encounter critical care time was devoted to patient care services described in this note for 60 minutes.  Julian Hy, DO 11/05/21 3:21 PM [facility]

## 2021-11-05 NOTE — Progress Notes (Addendum)
eLink Physician-Brief Progress Note Patient Name: [name] DOB: 1951-01-28 MRN: [id number]   Date of Service  11/05/2021  HPI/Events of Note  AM Plts 16   K+ 3.4 but on CRRT     On vent with OG and C-line. Decreasing platelet. Neuro wise is improving. No bleeding. Ecr 3.4. Mag 1.8T no bleeding.   eICU Interventions  Transfuse platelets if < 10 K. Follow platelet count.  Getting DIC panel q12 hrly, AM still pending, fibrinogen was 521 yesterday evening. On CRRT. Avoid heparin flushes.      Intervention Category Intermediate Interventions: Diagnostic test evaluation;Other:  Elmer Sow 11/05/2021, 6:25 AM

## 2021-11-05 NOTE — Progress Notes (Signed)
ETT holder changed. Patient tolerated well.

## 2021-11-06 ENCOUNTER — Encounter (HOSPITAL_COMMUNITY): Payer: Medicare PPO

## 2021-11-06 ENCOUNTER — Inpatient Hospital Stay (HOSPITAL_COMMUNITY): Payer: Medicare PPO

## 2021-11-06 DIAGNOSIS — J9601 Acute respiratory failure with hypoxia: Secondary | ICD-10-CM

## 2021-11-06 DIAGNOSIS — I469 Cardiac arrest, cause unspecified: Secondary | ICD-10-CM | POA: Diagnosis not present

## 2021-11-06 DIAGNOSIS — M79604 Pain in right leg: Secondary | ICD-10-CM | POA: Diagnosis not present

## 2021-11-06 DIAGNOSIS — Z9911 Dependence on respirator [ventilator] status: Secondary | ICD-10-CM | POA: Diagnosis not present

## 2021-11-06 LAB — GLUCOSE, CAPILLARY
Glucose-Capillary: 222 mg/dL — ABNORMAL HIGH (ref 70–99)
Glucose-Capillary: 230 mg/dL — ABNORMAL HIGH (ref 70–99)
Glucose-Capillary: 207 mg/dL — ABNORMAL HIGH (ref 70–99)
Glucose-Capillary: 251 mg/dL — ABNORMAL HIGH (ref 70–99)
Glucose-Capillary: 261 mg/dL — ABNORMAL HIGH (ref 70–99)
Glucose-Capillary: 210 mg/dL — ABNORMAL HIGH (ref 70–99)
Glucose-Capillary: 169 mg/dL — ABNORMAL HIGH (ref 70–99)

## 2021-11-06 LAB — RENAL FUNCTION PANEL
Albumin: 2 g/dL — ABNORMAL LOW (ref 3.5–5.0)
Albumin: 2.1 g/dL — ABNORMAL LOW (ref 3.5–5.0)
Anion gap: 15 (ref 5–15)
Anion gap: 10 (ref 5–15)
BUN: 40 mg/dL — ABNORMAL HIGH (ref 8–23)
BUN: 44 mg/dL — ABNORMAL HIGH (ref 8–23)
CO2: 19 mmol/L — ABNORMAL LOW (ref 22–32)
CO2: 21 mmol/L — ABNORMAL LOW (ref 22–32)
Calcium: 7.5 mg/dL — ABNORMAL LOW (ref 8.9–10.3)
Calcium: 7.5 mg/dL — ABNORMAL LOW (ref 8.9–10.3)
Chloride: 102 mmol/L (ref 98–111)
Chloride: 103 mmol/L (ref 98–111)
Creatinine, Ser: 2.19 mg/dL — ABNORMAL HIGH (ref 0.44–1.00)
Creatinine, Ser: 2.13 mg/dL — ABNORMAL HIGH (ref 0.44–1.00)
GFR, Estimated: 23 mL/min — ABNORMAL LOW (ref >60)
GFR, Estimated: 24 mL/min — ABNORMAL LOW (ref >60)
Glucose, Bld: 236 mg/dL — ABNORMAL HIGH (ref 70–99)
Glucose, Bld: 268 mg/dL — ABNORMAL HIGH (ref 70–99)
Phosphorus: 2.6 mg/dL (ref 2.5–4.6)
Phosphorus: 2.7 mg/dL (ref 2.5–4.6)
Potassium: 3.6 mmol/L (ref 3.5–5.1)
Potassium: 3.3 mmol/L — ABNORMAL LOW (ref 3.5–5.1)
Sodium: 136 mmol/L (ref 135–145)
Sodium: 134 mmol/L — ABNORMAL LOW (ref 135–145)

## 2021-11-06 LAB — DIC (DISSEMINATED INTRAVASCULAR COAGULATION)PANEL
D-Dimer, Quant: 14.58 ug/mL-FEU — ABNORMAL HIGH (ref 0.00–0.50)
Fibrinogen: 620 mg/dL — ABNORMAL HIGH (ref 210–475)
INR: 1.3 — ABNORMAL HIGH (ref 0.8–1.2)
Platelets: 17 10*3/uL — CL (ref 150–400)
Prothrombin Time: 16.2 seconds — ABNORMAL HIGH (ref 11.4–15.2)
Smear Review: NONE SEEN
aPTT: 55 seconds — ABNORMAL HIGH (ref 24–36)

## 2021-11-06 LAB — HEPARIN INDUCED PLATELET AB (HIT ANTIBODY): Heparin Induced Plt Ab: 0.14 OD (ref 0.000–0.400)

## 2021-11-06 LAB — CBC
HCT: 25.6 % — ABNORMAL LOW (ref 36.0–46.0)
Hemoglobin: 8.8 g/dL — ABNORMAL LOW (ref 12.0–15.0)
MCH: 29.6 pg (ref 26.0–34.0)
MCHC: 34.4 g/dL (ref 30.0–36.0)
MCV: 86.2 fL (ref 80.0–100.0)
Platelets: 18 10*3/uL — CL (ref 150–400)
RBC: 2.97 MIL/uL — ABNORMAL LOW (ref 3.87–5.11)
RDW: 17.8 % — ABNORMAL HIGH (ref 11.5–15.5)
WBC: 22.7 10*3/uL — ABNORMAL HIGH (ref 4.0–10.5)
nRBC: 1.3 % — ABNORMAL HIGH (ref 0.0–0.2)

## 2021-11-06 LAB — CULTURE, RESPIRATORY W GRAM STAIN

## 2021-11-06 LAB — HEPARIN LEVEL (UNFRACTIONATED)
Heparin Unfractionated: <0.1 IU/mL — ABNORMAL LOW (ref 0.30–0.70)
Heparin Unfractionated: 0.33 IU/mL (ref 0.30–0.70)

## 2021-11-06 LAB — URIC ACID: Uric Acid, Serum: 2.7 mg/dL (ref 2.5–7.1)

## 2021-11-06 LAB — MAGNESIUM: Magnesium: 2.5 mg/dL — ABNORMAL HIGH (ref 1.7–2.4)

## 2021-11-06 MED ORDER — ORAL CARE MOUTH RINSE
15.0000 mL | Freq: Two times a day (BID) | OROMUCOSAL | Status: DC
  Administered 2021-11-06 – 2021-11-17 (×18): 15 mL via OROMUCOSAL

## 2021-11-06 MED ORDER — INSULIN DETEMIR 100 UNIT/ML ~~LOC~~ SOLN
5.0000 [IU] | Freq: Two times a day (BID) | SUBCUTANEOUS | Status: DC
  Administered 2021-11-06 – 2021-11-07 (×2): 5 [IU] via SUBCUTANEOUS
  Filled 2021-11-06 (×6): qty 0.05

## 2021-11-06 MED ORDER — GABAPENTIN 250 MG/5ML PO SOLN
100.0000 mg | Freq: Three times a day (TID) | ORAL | Status: DC
  Administered 2021-11-06: 100 mg
  Filled 2021-11-06 (×4): qty 2

## 2021-11-06 MED ORDER — SODIUM CHLORIDE 0.9 % IV SOLN: INTRAVENOUS | Status: DC | PRN

## 2021-11-06 MED ORDER — VITAL 1.5 CAL PO LIQD
1000.0000 mL | ORAL | Status: DC
  Administered 2021-11-06: 1000 mL

## 2021-11-06 MED ORDER — DIPHENHYDRAMINE HCL 50 MG/ML IJ SOLN: 12.5000 mg | Freq: Four times a day (QID) | INTRAMUSCULAR | Status: DC | PRN

## 2021-11-06 MED ORDER — INSULIN ASPART 100 UNIT/ML IJ SOLN
5.0000 [IU] | Freq: Once | INTRAMUSCULAR | Status: AC
  Administered 2021-11-06: 5 [IU] via SUBCUTANEOUS

## 2021-11-06 MED ORDER — OXYCODONE HCL 5 MG/5ML PO SOLN
5.0000 mg | ORAL | Status: DC | PRN
  Administered 2021-11-06 – 2021-11-26 (×4): 5 mg
  Filled 2021-11-06 (×5): qty 5

## 2021-11-06 MED ORDER — ORAL CARE MOUTH RINSE: 15.0000 mL | Freq: Two times a day (BID) | OROMUCOSAL | Status: DC

## 2021-11-06 MED ORDER — INSULIN ASPART 100 UNIT/ML IJ SOLN
2.0000 [IU] | INTRAMUSCULAR | Status: DC
  Administered 2021-11-06: 6 [IU] via SUBCUTANEOUS
  Administered 2021-11-07 (×2): 4 [IU] via SUBCUTANEOUS
  Administered 2021-11-07: 6 [IU] via SUBCUTANEOUS
  Administered 2021-11-07: 4 [IU] via SUBCUTANEOUS
  Administered 2021-11-07 (×2): 2 [IU] via SUBCUTANEOUS
  Administered 2021-11-08: 6 [IU] via SUBCUTANEOUS

## 2021-11-06 MED ORDER — GERHARDT'S BUTT CREAM
TOPICAL_CREAM | CUTANEOUS | Status: DC | PRN
  Administered 2021-11-21 – 2021-11-25 (×2): 1 via TOPICAL
  Filled 2021-11-06 (×2): qty 1

## 2021-11-06 MED ORDER — HYDROCORTISONE ACETATE 25 MG RE SUPP
25.0000 mg | Freq: Two times a day (BID) | RECTAL | Status: AC
  Administered 2021-11-06 – 2021-11-08 (×5): 25 mg via RECTAL
  Filled 2021-11-06 (×7): qty 1

## 2021-11-06 NOTE — Progress Notes (Signed)
Nutrition Follow-up  DOCUMENTATION CODES:   Not applicable  INTERVENTION:   If pt extubated today, plan per discussed with MD Carlis Abbott is to administer platelets and place Cortrak tomorrow as able  Tube Feeding via OG if remains on vent:  Vital 1.5 at 55 ml/hr Begin at 20 ml/hr; titrate by 10 mL q 6 hours until goal rate 55 ml/hr Continue current Pro-Source regimen; once tolerating at goal rate, change to Pro-Source TF 45 mL TID Goal regimen will provide 2100 kcals, 122 g of protein and 1003 mL of free water  Continue Pro-Source TF 90 mL x 5 time daily; each packet provides 11 g of protein and 40 kcals  Continue B complex with C while on CRRT   NUTRITION DIAGNOSIS:   Inadequate oral intake related to acute illness as evidenced by NPO status.  Being addressed via TF titration   GOAL:   Patient will meet greater than or equal to 90% of their needs  Progressing  MONITOR:   Vent status, Labs, Weight trends, TF tolerance  REASON FOR ASSESSMENT:   Ventilator    ASSESSMENT:   71 yo female admitted with acute encephalopathy post OOH prolonged V.fib arrest with multiorgan failure with massive saddle PE and DIC, AKI. PMH includes DM, HTN, HLD, GERD, anxiety, depression, fibromyalgia  1/01 Admitted, VF arrest, Intubated, Concern for anoxic injury 1/02 Saddle PE s/p Mechanical thrombectomy 1/03 Hemodynamics improved post thrombectomy, weaning pressors, Hgb table, bleeding reduced, started trickle TF. Later in day, pressor requirement increased with Hgb drop to around 5 and found to have large intraperitoneal/intraparenchymal hemorrhage extending into left subscapular hematoma. Pt received PRBC x 5, FFP x 5, Platelets x 1 1/05 acute hemorrhage from left hepatic lob s/p Left hepatic lobe embolization, TF resumed at trickle 1/06 Episode of emesis after TF increased to 65 ml/hr 1/09 TF held all weekend, resuming trickle TF today 1/10 Tolerating trickle TF  Pt remains on vent  support, on levophed only. Weaning today, possible extubation Pt remains on CRRT  Tolerating Vital AF 1.2 at 10 ml/hr  Current wt 82.2 kg; admit wt 77 kg. Net +3 L per I/O flow sheet  Labs: phosphorus 2.6 (wdl), potassium 3.6 (wdl),CBGs 161-251 (ICU goal 140-180)  Meds: B complex with C, ss novolog, levemir   Diet Order:   Diet Order             Diet NPO time specified  Diet effective now                   EDUCATION NEEDS:   Not appropriate for education at this time  Skin:  Skin Assessment: Reviewed RN Assessment  Last BM:  1/09  Height:   Ht Readings from Last 1 Encounters:  10/28/21 5\' 5"  (1.651 m)    Weight:   Wt Readings from Last 1 Encounters:  11/06/21 82.2 kg    BMI:  Body mass index is 30.16 kg/m.  Estimated Nutritional Needs:   Kcal:  1950-2150 kcals  Protein:  100-125 g  Fluid:  >/= 1.8 L   Kerman Passey MS, RDN, LDN, CNSC Registered Dietitian III Clinical Nutrition RD Pager and On-Call Pager Number Located in [place]

## 2021-11-06 NOTE — Procedures (Signed)
Extubation Procedure Note  Patient Details:   Name: [name] DOB: 10/13/1951 MRN: [id number]   Airway Documentation:    Vent end date: 11/06/21 Vent end time: 1824   Evaluation  O2 sats: stable throughout Complications: No apparent complications Patient did tolerate procedure well. Bilateral Breath Sounds: Other (Comment) (coarse)   Pt extubated to 4L [place] per MD order. Pt had positive cuff leak prior to extubation. No stridor noted. Pt able to voice her name.  Vilinda Blanks 11/06/2021, 6:24 PM

## 2021-11-06 NOTE — TOC Initial Note (Signed)
Transition of Care [facility]) - Initial/Assessment Note    Patient Details  Name: [name] MRN: [id number] Date of Birth: [birth year]  [facility] CM/SW Contact:    Bethena Roys, RN Phone Number: 11/06/2021, 3:07 PM  Clinical Narrative: Patient discussed in the Quality Collaborative ([place]) Meeting. Patient continues on IV Heparin and Levophed gtt. Case Manager will continue to follow for disposition needs.                  Expected Discharge Plan: [place] Barriers to Discharge: Continued Medical Work up       Expected Discharge Plan and Services Expected Discharge Plan: [place] In-house Referral: Clinical Social Work Discharge Planning Services: CM Consult Post Acute Care Choice: [place] arrangements for the past 2 months: Apartment                    Prior Living Arrangements/Services Living arrangements for the past 2 months: Apartment   Patient language and need for interpreter reviewed:: Yes        Need for Family Participation in Patient Care: Yes (Comment) Care giver support system in place?: Yes (comment)   Criminal Activity/Legal Involvement Pertinent to Current Situation/Hospitalization: No - Comment as needed  Emotional Assessment Appearance:: Appears stated age Attitude/Demeanor/Rapport: Unable to Assess Affect (typically observed): Unable to Assess   Alcohol / Substance Use: Not Applicable Psych Involvement: No (comment)  Admission diagnosis:  Cardiac arrest ([place]) [I46.9] Respiratory failure ([place]) [J96.90] Encounter for central line placement [Z45.2] Patient Active Problem List   Diagnosis Date Noted   Cardiac arrest ([place]) 10/28/2021   Adjustment disorder with depressed mood 04/10/2021   History of infectious disease 04/10/2021   Affective psychosis ([place]) 11/21/2020   Allergic rhinitis due to pollen 11/21/2020   Anxiety 11/21/2020   Chronic kidney disease, stage 2 (mild)  11/21/2020   Diabetic renal disease ([place]) 11/21/2020   Diverticular disease of colon 11/21/2020   Eczema 11/21/2020   Gastroesophageal reflux disease 11/21/2020   Gout 11/21/2020   Joint pain 11/21/2020   Lymphedema 11/21/2020   Mild intermittent asthma 11/21/2020   Moderate major depression, single episode ([place]) 11/21/2020   Neuropathy 11/21/2020   Type 2 diabetes mellitus with other specified complication ([place]) 37/07/6268   Pure hypercholesterolemia 11/21/2020   Vitamin D deficiency 11/21/2020   Fibromyalgia 09/04/2017   Insomnia 09/04/2017   DDD (degenerative disc disease), cervical 09/04/2017   DDD (degenerative disc disease), lumbar 09/04/2017   Primary osteoarthritis of both hands 09/04/2017   Primary osteoarthritis of both knees 09/04/2017   History of rotator cuff tear 09/04/2017   Osteopenia of multiple sites 09/04/2017   History of diabetes mellitus 09/04/2017   History of hypertension 09/04/2017   History of hypercholesterolemia 09/04/2017   PCP:  [name], MD Pharmacy:   [facility] [address], [address] AT [address] [place] [address] Phone: [phone] Fax: [phone]   Readmission Risk Interventions Readmission Risk Prevention Plan 11/02/2021  Transportation Screening Complete  Some recent data might be hidden

## 2021-11-06 NOTE — Progress Notes (Signed)
eLink Physician-Brief Progress Note Patient Name: [name] DOB: 09/13/1951 MRN: [id number]   Date of Service  11/06/2021  HPI/Events of Note  Inquiry about due Levemir as patient now NPO post exubation Patient did receive Levemir 5 this morning and short acting insulin 6 units tonight Most recent glucose 169  eICU Interventions  Hold tonight's dose of Levemir while NPO Bedside rounding MD to reassess if morning dose of Levemir may be resumed     Intervention Category Intermediate Interventions: Hyperglycemia - evaluation and treatment  [name] Cheridan Kibler 11/06/2021, 11:13 PM

## 2021-11-06 NOTE — Progress Notes (Signed)
[place] for heparin  Indication: pulmonary embolus  Allergies  Allergen Reactions   Aspirin Nausea And Vomiting    Other reaction(s): Unknown   Benadryl [Diphenhydramine]     Can only take dye free. States the dye causes itching.   Darvon [Propoxyphene] Itching    Can only during the day. Night time makes her itch   Hydrocodone Bit-[place] Mbr Itching   Metformin     Other reaction(s): GI   Other     Other reaction(s): Unknown. States reaction was to nasal spray that caused pupils to shrink   Pentazocine     nervous Other reaction(s): jitters   Percocet [Oxycodone-Acetaminophen]     itching   Sulfa Antibiotics Hives   Tetracaine Hcl     Other reaction(s): Unknown. Thinks caused itching.   Tetracyclines & Related Hives   Tramadol     Other reaction(s): itch    Patient Measurements: Height: 5\' 5"  (165.1 cm) Weight: 82.2 kg (181 lb 3.5 oz) IBW/kg (Calculated) : 57  Vital Signs: Temp: 99.2 F (37.3 C) (01/10 0400) Temp Source: Axillary (01/10 0400) BP: 117/52 (01/10 0630) Pulse Rate: 117 (01/10 0717)  Labs: Recent Labs    11/04/21 1606 11/04/21 1754 11/05/21 0500 11/05/21 0836 11/05/21 1631 11/05/21 2000 11/06/21 0356 11/06/21 0617  HGB 8.5*  --   --  9.5*  --   --  8.8*  --   HCT 25.8*  --   --  28.4*  --   --  25.6*  --   PLT 26*   26*  --  16* 17*  --   --  18*   17*  --   APTT 34  --  46*  --   --   --  55*  --   LABPROT 15.9*  --  16.0*  --   --   --  16.2*  --   INR 1.3*  --  1.3*  --   --   --  1.3*  --   HEPARINUNFRC  --    < > <0.10*  --   --  <0.10*  --  <0.10*  CREATININE 2.42*  --  2.32*  --  2.25*  --  2.19*  --    < > = values in this interval not displayed.     Estimated Creatinine Clearance: 24.6 mL/min (A) (by C-G formula based on SCr of 2.19 mg/dL (H)).   Medical History: Past Medical History:  Diagnosis Date   Anxiety    Arthritis    Asthma    Depression    Diabetes mellitus without  complication (HCC)    Fibromyalgia    GERD (gastroesophageal reflux disease)    Hyperlipemia    Hypertension    PONV (postoperative nausea and vomiting)     Assessment: 71 yo female with saddle PE - s/p thrombectomy and started on heparin - then stopped after intraperitoneal bleed.   Patient with low pltc 20s, D-Dimer back up >20, fibrinogen back up 570 and CRRT circuit clotting at least once per shift. Resume low dose heparin drip no bolus and up titrate slowly.  Heparin level undetectable x3 now on 850 units/hr. Hgb around 8.8 and plt count down to 17 this morning and stable from yesterday (planning platelet transfusion for <10). Will be conservative on dose adjustments with low platelet count. No signs or symptoms of bleeding.  Goal of Therapy:  Heparin level 0.3-0.5 units/ml Monitor platelets by anticoagulation protocol: Yes  Plan:  Increase Heparin drip 1150 units/hr Heparin level and CBC in am Monitor s/s bleeding    Varney Daily, PharmD PGY1 Pharmacy Resident  Please check AMION for all [facility] pharmacy phone numbers After 10:00 PM call main pharmacy [phone]

## 2021-11-06 NOTE — Progress Notes (Signed)
NAME:  [name], MRN:  [id number], DOB:  December 19, 1950, LOS: 9 ADMISSION DATE:  10/28/2021, CONSULTATION DATE: 10/28/21 REFERRING MD: Dr. Sherry Ruffing, CHIEF COMPLAINT:  Cardiac Arrest, VF   History of Present Illness:  [name] presenting with prolonged OOH VF arrest secondary to massive pulmonary embolus complicated by DIC.  Pertinent  Medical History  Anxiety / Depression  Fibromyalgia  Asthma  DM  GERD  HTN  HLD   Significant Hospital Events: Including procedures, antibiotic start and stop dates in addition to other pertinent events   1/1 Admit post VF arrest  1/2 S/p mechanical thrombectomy of right and left PA 1/3 Weaned to low dose epi and levo. In the evening increased pressor requirement and Hg drop to ~5. Found with large intraperitoneal/intraparenchymal hemorrhage extending into left subcapsular hematoma. Received PRBC x 5, FFP x 5 and Plt x 1 1/4 Overnight worsening pressor requirement and Hg ~4. Transfused PRBC x 3, FFP x 2 and platelet x 2. S/p embolization of left hepatic lobe 1/8 meropenem and vanc started, CT head WNL  Interim History / Subjective:  Very sedated after seroquel. This morning she denies complaints other than some pain.   Objective   Blood pressure (!) 117/52, pulse (!) 110, temperature 99.2 F (37.3 C), temperature source Axillary, resp. rate (!) 26, height 5\' 5"  (1.651 m), weight 82.2 kg, SpO2 94 %. CVP:  [4 mmHg] 4 mmHg  Vent Mode: PSV;CPAP FiO2 (%):  [30 %-40 %] 30 % Set Rate:  [22 bmp] 22 bmp Vt Set:  [450 mL-490 mL] 450 mL PEEP:  [5 cmH20] 5 cmH20 Pressure Support:  [5 cmH20] 5 cmH20 Plateau Pressure:  [14 cmH20-25 cmH20] 15 cmH20   Intake/Output Summary (Last 24 hours) at 11/06/2021 0716 Last data filed at 11/06/2021 0700 Gross per 24 hour  Intake 1243.64 ml  Output 6441 ml  Net -5197.36 ml    Filed Weights   11/04/21 0500 11/05/21 0500 11/06/21 0600  Weight: 95.3 kg 88 kg 82.2 kg   Physical Exam: General: critically ill appearing  woman lying in bed in NAD HEENT:  [place]/AT, eyes anicteric Respiratory:  tachypneic on 5/5, CTAB Cardiovascular: S1S2, tachycardic GI: soft, obese, NT Extremities: improving edema, R femoral canula without erythema or bleeding Neuro: RASS -3, tracking.   Ancillary tests personally reviewed:  EEG: generalized irreg slow activity, absent posterior dominant rhythm-- c/w generalized cerebral dysfunction. No epileptiform activity.  K+ 3.6 BUN 40 Cr 2.19  no schistocytes on smear Platelets 17 D-dimer 14.58 Fibrinogen 620 Heparin <0.1 INR 1.3  Resp culture> rare WBC, both PMNs and monos Blood culture 1/8>NGTD  Assessment & Plan:  Acute respiratory failure with hypoxia Acute massive saddle PE Aspiration pneumonia -LTVV, 4-8cc/kg IBW with goalPplat<30 and DP<15 -VAP prevention protocol -PAD protocol for sedation -goal net negative volume status-- still pulling with CRRT -daily SAT & SBT; failed this morning. Planning to reattempt when she is more awake. -con't heparin for PE. - con't empiric meropenem, stop vanc  VF arrest due to massive PE (unprovoked, but concerning liver mass), preceded by presyncopal episode -con't supportive care -heparin for PE; avoiding switching to bival given issues with CRRT stopping and bival being renally dose in setting of low platelets  Acute metabolic encephalopathy in setting of arrest, improving -limit sedation -stop seroquel -fentanyl PRN; oxycodone PRN for pain  Unprovoked massive pulmonary embolism with consumptive coagulopathy. Has never had schistocytes on smear this admission. Initial CBC in the ED was diluted specimen. Subsequently began having  intraabdominal bleeding on 1/2, which is when her platelet count began to fall. She has not received matched transfusions this admission due to clotting concerns, so she remains in a diluted state. -monitor coags, can stop daily DIC panels  Hemorrhagic shock due to hepatic bleeding on 1/2- 1/3; s/p  6 U PRBC (+2 more in the ED), FFP x 5 (1 was given at admission) and Plt x 3 (+ 2 in the ED at admission). Liver embolized 1/4. -serial CBCs; monitor for signs of bleeding -goal SBP <160 due to concern for spontaneous bleeding with low platelets   Obstructive shock secondary to PE- resolved. Now hypertensive.  -hydralazine PRN for SBP >160 -NE PRN for SBP <100 to maintain adequate CNS and renal perfusion  Acute renal failure, related to low perfusion during arrest  -CRRT, appreciate nephrology's recommendations -Renally dose meds, avoid nephrotoxic meds - Strict I/Os - Renal function panels  DM II with controlled hyperglycemia, no DKA -Levemir 5 units twice daily - Sliding scale insulin as needed - Goal BG 140-180  Anion gap acidosis -suspect this is due to renal failure -Nontender -Checking uric acid  Right leg pain; this is chronic, but more bothersome today - Review right leg ultrasound - Checking uric acid - Oxycodone and fentanyl as needed  Hypocalcemia due to renal failure, blood transfusions -Continue to monitor  Hypomagnesemia, resolved Hypokalemia, resolved -Monitor with renal function panels  At risk for malnutrition, previously had vomiting with TF -Okay to start increasing tube feed rate given tolerance.  HLD -Atorvastatin (on simvastatin PTA)  GERD -Continue PPI, on PPI at home  Anxiety, Depression, Fibromyalgia  -Cautiously starting back gabapentin 100 mg 3 times daily per tube.  Need to watch for excessive sedation.  This is likely not going to be titratable with her renal failure. -May start back baclofen tomorrow if pain persists  Liver lesions -MRI to further evaluate when she recovers  Daughter updated at bedside today during rounds.  Best Practice (right click and "Reselect all SmartList Selections" daily)  Diet/type: tubefeeds DVT prophylaxis: systemic heparin GI prophylaxis: PPI Lines: Central line Foley:  N/A Code Status:  full  code Last date of multidisciplinary goals of care discussion: 1/10, continue aggressive care    This patient is critically ill with multiple organ system failure which requires frequent high complexity decision making, assessment, support, evaluation, and titration of therapies. This was completed through the application of advanced monitoring technologies and extensive interpretation of multiple databases. During this encounter critical care time was devoted to patient care services described in this note for 46 minutes.  Julian Hy, DO 11/06/21 3:23 PM [facility]

## 2021-11-06 NOTE — Progress Notes (Signed)
Passed SBT this afternoon for 1 hour, can cough >1L Vt, breathing in low 20s, Vt ~500cc. No accessory muscle use. +cuff leak. Extubated this afternoon. Some hoarseness, able to speak quietly. No stridor, no accessory muscle use. Daughter at bedside.  Julian Hy, DO 11/06/21 6:25 PM [facility]

## 2021-11-06 NOTE — Progress Notes (Signed)
[place] for heparin  Indication: pulmonary embolus  Allergies  Allergen Reactions   Aspirin Nausea And Vomiting    Other reaction(s): Unknown   Benadryl [Diphenhydramine]     Can only take dye free. States the dye causes itching.   Darvon [Propoxyphene] Itching    Can only during the day. Night time makes her itch   Hydrocodone Bit-Homatrop Mbr Itching   Metformin     Other reaction(s): GI   Other     Other reaction(s): Unknown. States reaction was to nasal spray that caused pupils to shrink   Pentazocine     nervous Other reaction(s): jitters   Percocet [Oxycodone-Acetaminophen]     itching   Sulfa Antibiotics Hives   Tetracaine Hcl     Other reaction(s): Unknown. Thinks caused itching.   Tetracyclines & Related Hives   Tramadol     Other reaction(s): itch    Patient Measurements: Height: 5\' 5"  (165.1 cm) Weight: 82.2 kg (181 lb 3.5 oz) IBW/kg (Calculated) : 57  Vital Signs: Temp: 98.7 F (37.1 C) (01/10 1600) Temp Source: Oral (01/10 1600) BP: 100/56 (01/10 1800) Pulse Rate: 113 (01/10 1824)  Labs: Recent Labs    11/04/21 1606 11/04/21 1754 11/05/21 0500 11/05/21 0836 11/05/21 1631 11/05/21 2000 11/06/21 0356 11/06/21 0617 11/06/21 1615 11/06/21 1746  HGB 8.5*  --   --  9.5*  --   --  8.8*  --   --   --   HCT 25.8*  --   --  28.4*  --   --  25.6*  --   --   --   PLT 26*   26*  --  16* 17*  --   --  18*   17*  --   --   --   APTT 34  --  46*  --   --   --  55*  --   --   --   LABPROT 15.9*  --  16.0*  --   --   --  16.2*  --   --   --   INR 1.3*  --  1.3*  --   --   --  1.3*  --   --   --   HEPARINUNFRC  --    < > <0.10*  --   --  <0.10*  --  <0.10*  --  0.33  CREATININE 2.42*  --  2.32*  --  2.25*  --  2.19*  --  2.13*  --    < > = values in this interval not displayed.     Estimated Creatinine Clearance: 25.3 mL/min (A) (by C-G formula based on SCr of 2.13 mg/dL (H)).   Assessment: 71 yo female with  saddle PE - s/p thrombectomy and started on heparin - then stopped after intraperitoneal bleed.   Patient with low pltc 20s, D-Dimer back up >20, fibrinogen back up 570 and CRRT circuit clotting at least once per shift. Resume low dose heparin drip no bolus and up titrate slowly.  Heparin level now therapeutic (0.33) on gtt at 1150 units/hr. Hgb around 8.8 and plt count down to 17 this morning and stable from yesterday (planning platelet transfusion for <10). Will be conservative on dose adjustments with low platelet count. No signs or symptoms of bleeding.  Goal of Therapy:  Heparin level 0.3-0.5 units/ml Monitor platelets by anticoagulation protocol: Yes   Plan:  Continue heparin drip at 1150 units/hr Heparin level  and CBC in am Monitor s/s bleeding, HIT panel pending   Sherlon Handing, PharmD, BCPS Please see amion for complete clinical pharmacist phone list 11/06/2021 6:48 PM

## 2021-11-06 NOTE — Progress Notes (Signed)
[facility] Progress Note     Assessment/ Plan:   Renal failure secondary to ATN in the setting of cardiac arrest, hypotension, pressors, contrast.  BL CKD3A with BL creatinine in the 1.17-1.3 range in late 2021. - R fem HD cath - started CRRT 10/31/20 - changed to all 4K bath1/9/23 - UF goal 100-400 mL/ min net neg as BP allows   Prolonged VF cardiac arrest--> OOH VF arrest 2/2 massive PE Massive PE s/p mechanical thrombectomy 1/2 of R and L PA Retroperitoneal bleed/Hemorrhagic shock/Acute blood loss anemia originating from left liver lobe s/p embolization 1/4--> required FFP, plts, Vit K, pRBCs Thrombocytopenia- DIC vs HIT vs other; on hep gtt d/t #3, per primary DM HTN Acute hypoxic RF- on vent Sepsis/ leukocytosis- on meropenem, cultures pending, WBC ct increasing Dispo: ICU  Subjective:    Was on SBT this AM- tired out so put back on the vent.  CVP 4, UF goal bumped down to 100 mL/ hr but was excellently net neg yesterday.     Objective:   BP (!) 107/52    Pulse 91    Temp 99.8 F (37.7 C) (Oral)    Resp (!) 23    Ht 5' 5"  (1.651 m)    Wt 82.2 kg    SpO2 94%    BMI 30.16 kg/m   Intake/Output Summary (Last 24 hours) at 11/06/2021 1012 Last data filed at 11/06/2021 0700 Gross per 24 hour  Intake 1222.59 ml  Output 5228 ml  Net -4005.41 ml   Weight change: -5.8 kg  Physical Exam: GEN: on Vent, sleeping HEENT:  eyes closed NECK: Supple, no thyromegaly LUNGS: CTA on vent CV: Tachy ABD: hypoactive bowel sounds EXT: 1+ anasarca- much improved from yesterday ACCESS: L fem nontunneled HD cath   Imaging: CT HEAD WO CONTRAST (5MM)  Result Date: 11/04/2021 CLINICAL DATA:  Stroke, hemorrhagic Additional history: Patient is status post cardiac arrest. EXAM: CT HEAD WITHOUT CONTRAST TECHNIQUE: Contiguous axial images were obtained from the base of the skull through the vertex without intravenous contrast. COMPARISON:  CT head 10/28/2021. FINDINGS: Motion  limited study. Brain: No evidence of acute large vascular territory infarction, hemorrhage, hydrocephalus, extra-axial collection or mass lesion/mass effect. Vascular: No hyperdense vessel identified. Skull: No acute fracture. Sinuses/Orbits: Clear sinuses.  Unremarkable orbits. Other: No mastoid effusions IMPRESSION: No evidence of acute intracranial abnormality on this motion limited study. MRI could provide more sensitive evaluation for hypoxic/ischemic injury if clinically indicated. Electronically Signed   By: Margaretha Sheffield M.D.   On: 11/04/2021 14:40   EEG adult  Result Date: 11/04/2021 Greta Doom, MD     11/04/2021  3:33 PM History: 71 yo F with encephalopathy Sedation: fentanyl prn Technique: This EEG was acquired with electrodes placed according to the International 10-20 electrode system (including Fp1, Fp2, F3, F4, C3, C4, P3, P4, O1, O2, T3, T4, T5, T6, A1, A2, Fz, Cz, Pz). The following electrodes were missing or displaced: none. Background: The background consists predominantly of generalized irregular delta activity.  There is some superimposed irregular theta as well.  There is no clear posterior dominant rhythm.  There was no epileptiform discharges seen.  Clear sleep structures were not seen. Photic stimulation: Physiologic driving is now performed EEG Abnormalities: 1) generalized irregular slow activity 2) absent posterior dominant rhythm Clinical Interpretation: This EEG is consistent with a generalized non-specific cerebral dysfunction(encephalopathy). There was no seizure or seizure predisposition recorded on this study. Please note that lack of epileptiform activity  on EEG does not preclude the possibility of epilepsy. Roland Rack, MD [facility] [phone] If 7pm- 7am, please page neurology on call as listed in [place].    Labs: BMET Recent Labs  Lab 11/03/21 0447 11/03/21 1600 11/04/21 0539 11/04/21 1606 11/05/21 0500 11/05/21 1631  11/06/21 0356  NA 133*   133* 134* 134* 133* 137 136 136  K 4.6   4.6 3.9 3.8 3.6 3.4* 3.5 3.6  CL 102   102 100 101 101 103 103 102  CO2 20*   20* 22 21* 18* 22 17* 19*  GLUCOSE 245*   247* 237* 177* 147* 151* 170* 236*  BUN 36*   37* 38* 36* 37* 37* 37* 40*  CREATININE 2.70*   2.71* 2.48* 2.43* 2.42* 2.32* 2.25* 2.19*  CALCIUM 7.5*   7.4* 7.4* 7.5* 7.3* 7.3* 7.3* 7.5*  PHOS 4.9* 3.5 2.5 2.6 2.6 2.4* 2.6   CBC Recent Labs  Lab 10/30/21 1357 10/30/21 1721 11/04/21 0444 11/04/21 0539 11/04/21 1606 11/05/21 0500 11/05/21 0836 11/06/21 0356  WBC 21.2*   < > 18.7*  --  20.2*  --  23.6* 22.7*  NEUTROABS 17.9*  --   --   --   --   --   --   --   HGB 8.3*   < > 8.5*  --  8.5*  --  9.5* 8.8*  HCT 22.4*   < > 24.9*  --  25.8*  --  28.4* 25.6*  MCV 77.8*   < > 86.8  --  86.9  --  88.8 86.2  PLT 100*   < > 28*   < > 26*   26* 16* 17* 18*   17*   < > = values in this interval not displayed.    Medications:     atorvastatin  20 mg Per Tube Daily   B-complex with vitamin C  1 tablet Per Tube Daily   chlorhexidine gluconate (MEDLINE KIT)  15 mL Mouth Rinse BID   Chlorhexidine Gluconate Cloth  6 each Topical Daily   feeding supplement (PROSource TF)  90 mL Per Tube 5 X Daily   hydrocortisone  25 mg Rectal BID   insulin aspart  1-3 Units Subcutaneous Q4H   insulin detemir  5 Units Subcutaneous BID   mouth rinse  15 mL Mouth Rinse 10 times per day   pantoprazole (PROTONIX) IV  40 mg Intravenous Q12H   sodium chloride flush  10-40 mL Intracatheter Q12H      Madelon Lips, MD 11/06/2021, 10:12 AM

## 2021-11-06 NOTE — Progress Notes (Signed)
Right lower extremity venous duplex has been completed. Preliminary results can be found in CV Proc through chart review.   11/06/21 3:24 PM [name] RVT

## 2021-11-07 ENCOUNTER — Encounter (HOSPITAL_COMMUNITY): Payer: Self-pay | Admitting: Critical Care Medicine

## 2021-11-07 ENCOUNTER — Other Ambulatory Visit: Payer: Self-pay

## 2021-11-07 DIAGNOSIS — D696 Thrombocytopenia, unspecified: Secondary | ICD-10-CM

## 2021-11-07 DIAGNOSIS — D573 Sickle-cell trait: Secondary | ICD-10-CM

## 2021-11-07 DIAGNOSIS — I2602 Saddle embolus of pulmonary artery with acute cor pulmonale: Principal | ICD-10-CM

## 2021-11-07 DIAGNOSIS — I469 Cardiac arrest, cause unspecified: Secondary | ICD-10-CM | POA: Diagnosis not present

## 2021-11-07 DIAGNOSIS — R945 Abnormal results of liver function studies: Secondary | ICD-10-CM

## 2021-11-07 DIAGNOSIS — I2699 Other pulmonary embolism without acute cor pulmonale: Secondary | ICD-10-CM

## 2021-11-07 LAB — GLUCOSE, CAPILLARY
Glucose-Capillary: 165 mg/dL — ABNORMAL HIGH (ref 70–99)
Glucose-Capillary: 129 mg/dL — ABNORMAL HIGH (ref 70–99)
Glucose-Capillary: 101 mg/dL — ABNORMAL HIGH (ref 70–99)
Glucose-Capillary: 200 mg/dL — ABNORMAL HIGH (ref 70–99)
Glucose-Capillary: 144 mg/dL — ABNORMAL HIGH (ref 70–99)
Glucose-Capillary: 196 mg/dL — ABNORMAL HIGH (ref 70–99)
Glucose-Capillary: 210 mg/dL — ABNORMAL HIGH (ref 70–99)
Glucose-Capillary: 217 mg/dL — ABNORMAL HIGH (ref 70–99)
Glucose-Capillary: 226 mg/dL — ABNORMAL HIGH (ref 70–99)

## 2021-11-07 LAB — RENAL FUNCTION PANEL
Albumin: 2.1 g/dL — ABNORMAL LOW (ref 3.5–5.0)
Albumin: 2.1 g/dL — ABNORMAL LOW (ref 3.5–5.0)
Anion gap: 10 (ref 5–15)
Anion gap: 15 (ref 5–15)
BUN: 40 mg/dL — ABNORMAL HIGH (ref 8–23)
BUN: 42 mg/dL — ABNORMAL HIGH (ref 8–23)
CO2: 21 mmol/L — ABNORMAL LOW (ref 22–32)
CO2: 19 mmol/L — ABNORMAL LOW (ref 22–32)
Calcium: 7.8 mg/dL — ABNORMAL LOW (ref 8.9–10.3)
Calcium: 7.9 mg/dL — ABNORMAL LOW (ref 8.9–10.3)
Chloride: 105 mmol/L (ref 98–111)
Chloride: 100 mmol/L (ref 98–111)
Creatinine, Ser: 1.93 mg/dL — ABNORMAL HIGH (ref 0.44–1.00)
Creatinine, Ser: 2.22 mg/dL — ABNORMAL HIGH (ref 0.44–1.00)
GFR, Estimated: 27 mL/min — ABNORMAL LOW (ref >60)
GFR, Estimated: 23 mL/min — ABNORMAL LOW (ref >60)
Glucose, Bld: 143 mg/dL — ABNORMAL HIGH (ref 70–99)
Glucose, Bld: 224 mg/dL — ABNORMAL HIGH (ref 70–99)
Phosphorus: 2.7 mg/dL (ref 2.5–4.6)
Phosphorus: 4 mg/dL (ref 2.5–4.6)
Potassium: 3.8 mmol/L (ref 3.5–5.1)
Potassium: 4 mmol/L (ref 3.5–5.1)
Sodium: 136 mmol/L (ref 135–145)
Sodium: 134 mmol/L — ABNORMAL LOW (ref 135–145)

## 2021-11-07 LAB — CBC
HCT: 26.9 % — ABNORMAL LOW (ref 36.0–46.0)
HCT: 26.3 % — ABNORMAL LOW (ref 36.0–46.0)
Hemoglobin: 8.9 g/dL — ABNORMAL LOW (ref 12.0–15.0)
Hemoglobin: 8.7 g/dL — ABNORMAL LOW (ref 12.0–15.0)
MCH: 29 pg (ref 26.0–34.0)
MCH: 29.2 pg (ref 26.0–34.0)
MCHC: 33.1 g/dL (ref 30.0–36.0)
MCHC: 33.1 g/dL (ref 30.0–36.0)
MCV: 87.6 fL (ref 80.0–100.0)
MCV: 88.3 fL (ref 80.0–100.0)
Platelets: 5 10*3/uL — CL (ref 150–400)
Platelets: 5 10*3/uL — CL (ref 150–400)
RBC: 3.07 MIL/uL — ABNORMAL LOW (ref 3.87–5.11)
RBC: 2.98 MIL/uL — ABNORMAL LOW (ref 3.87–5.11)
RDW: 18.3 % — ABNORMAL HIGH (ref 11.5–15.5)
RDW: 18.8 % — ABNORMAL HIGH (ref 11.5–15.5)
WBC: 25.1 10*3/uL — ABNORMAL HIGH (ref 4.0–10.5)
WBC: 25 10*3/uL — ABNORMAL HIGH (ref 4.0–10.5)
nRBC: 0.4 % — ABNORMAL HIGH (ref 0.0–0.2)
nRBC: 0.5 % — ABNORMAL HIGH (ref 0.0–0.2)

## 2021-11-07 LAB — DIC (DISSEMINATED INTRAVASCULAR COAGULATION)PANEL
D-Dimer, Quant: 14.17 ug/mL-FEU — ABNORMAL HIGH (ref 0.00–0.50)
Fibrinogen: 725 mg/dL — ABNORMAL HIGH (ref 210–475)
INR: 1.2 (ref 0.8–1.2)
Platelets: <5 10*3/uL — CL (ref 150–400)
Prothrombin Time: 14.7 seconds (ref 11.4–15.2)
Smear Review: NONE SEEN
aPTT: 100 seconds — ABNORMAL HIGH (ref 24–36)

## 2021-11-07 LAB — SEROTONIN RELEASE ASSAY (SRA)
SRA .2 IU/mL UFH Ser-aCnc: 1 % (ref 0–20)
SRA 100IU/mL UFH Ser-aCnc: 1 % (ref 0–20)

## 2021-11-07 LAB — HEPARIN LEVEL (UNFRACTIONATED): Heparin Unfractionated: 0.39 IU/mL (ref 0.30–0.70)

## 2021-11-07 LAB — MAGNESIUM: Magnesium: 2.5 mg/dL — ABNORMAL HIGH (ref 1.7–2.4)

## 2021-11-07 MED ORDER — ROMIPLOSTIM 250 MCG ~~LOC~~ SOLR
2.0000 ug/kg | Freq: Once | SUBCUTANEOUS | Status: AC
  Administered 2021-11-07: 160 ug via SUBCUTANEOUS
  Filled 2021-11-07: qty 0.32

## 2021-11-07 MED ORDER — DEXAMETHASONE SODIUM PHOSPHATE 10 MG/ML IJ SOLN
20.0000 mg | INTRAMUSCULAR | Status: AC
  Administered 2021-11-07 – 2021-11-10 (×4): 20 mg via INTRAVENOUS
  Filled 2021-11-07 (×4): qty 2

## 2021-11-07 MED ORDER — SODIUM CHLORIDE 0.9 % IV SOLN
0.0900 mg/kg/h | INTRAVENOUS | Status: DC
  Administered 2021-11-07: 0.05 mg/kg/h via INTRAVENOUS
  Administered 2021-11-10: 0.07 mg/kg/h via INTRAVENOUS
  Filled 2021-11-07 (×2): qty 250

## 2021-11-07 MED ORDER — GABAPENTIN 250 MG/5ML PO SOLN
100.0000 mg | Freq: Two times a day (BID) | ORAL | Status: DC
  Filled 2021-11-07 (×3): qty 2

## 2021-11-07 MED ORDER — SODIUM CHLORIDE 0.9% IV SOLUTION: Freq: Once | INTRAVENOUS | Status: AC

## 2021-11-07 MED ORDER — SODIUM CHLORIDE 0.9 % IV SOLN
1.0000 g | Freq: Two times a day (BID) | INTRAVENOUS | Status: DC
  Administered 2021-11-07: 1 g via INTRAVENOUS
  Filled 2021-11-07: qty 1

## 2021-11-07 MED ORDER — PANTOPRAZOLE SODIUM 40 MG IV SOLR
40.0000 mg | Freq: Every day | INTRAVENOUS | Status: DC
  Administered 2021-11-07 – 2021-11-18 (×12): 40 mg via INTRAVENOUS
  Filled 2021-11-07 (×12): qty 40

## 2021-11-07 MED ORDER — FOLIC ACID 1 MG PO TABS: 2.0000 mg | ORAL_TABLET | Freq: Every day | ORAL | Status: DC

## 2021-11-07 NOTE — Progress Notes (Signed)
Pharmacy Antibiotic Note  [name] is a 71 y.o. female admitted on 7/0/0174 with PE complicated by intraperitoneal bleed, and VDRF. She has been treated with vancomycin  and cefepime.   WBC increasing 18, Tm increased 100, increased respiratory secretions.  Pharmacy consulted to broaden therapy to vancomycin and meropenem after new Cx drawn  Currently on CRRT and plans to stop at 1400 on 1/11.  Plan: Meropenem 1 g q12h   Height: 5\' 5"  (165.1 cm) Weight: 79 kg (174 lb 2.6 oz) IBW/kg (Calculated) : 57  Temp (24hrs), Avg:98.3 F (36.8 C), Min:97.5 F (36.4 C), Max:100.4 F (38 C)  Recent Labs  Lab 11/01/21 0415 11/01/21 0751 11/01/21 1411 11/01/21 1734 11/01/21 2018 11/02/21 0219 11/02/21 0502 11/02/21 0757 11/02/21 1439 11/02/21 1800 11/04/21 0444 11/04/21 0539 11/04/21 1606 11/05/21 0500 11/05/21 0836 11/05/21 1631 11/06/21 0356 11/06/21 1615 11/07/21 0416  WBC 17.6*   < > 15.8*  --  16.5* 16.4*  --  15.8* 16.0*   < > 18.7*  --  20.2*  --  23.6*  --  22.7*  --  25.1*  CREATININE 4.44*   4.44*  --  3.58*   < >  --   --    < >  --   --    < >  --    < > 2.42* 2.32*  --  2.25* 2.19* 2.13* 1.93*  LATICACIDVEN  --   --  2.7*  --  2.2* 1.9  --  2.0* 1.8  --   --   --   --   --   --   --   --   --   --   VANCORANDOM 17  --   --   --   --   --   --   --   --   --   --   --   --   --   --   --   --   --   --    < > = values in this interval not displayed.     Estimated Creatinine Clearance: 27.4 mL/min (A) (by C-G formula based on SCr of 1.93 mg/dL (H)).    Allergies  Allergen Reactions   Aspirin Nausea And Vomiting    Other reaction(s): Unknown   Benadryl [Diphenhydramine]     Can only take dye free. States the dye causes itching.   Darvon [Propoxyphene] Itching    Can only during the day. Night time makes her itch   Hydrocodone Bit-Homatrop Mbr Itching   Metformin     Other reaction(s): GI   Other     Other reaction(s): Unknown. States reaction was to  nasal spray that caused pupils to shrink   Pentazocine     nervous Other reaction(s): jitters   Percocet [Oxycodone-Acetaminophen]     itching   Sulfa Antibiotics Hives   Tetracaine Hcl     Other reaction(s): Unknown. Thinks caused itching.   Tetracyclines & Related Hives   Tramadol     Other reaction(s): itch    Antimicrobials this admission: Vanc 1/1 >> 1/10 Cefepime 1/1 >> 1/8 Meropenem 1/8 >> [1/15] Flagyl 1/4 >> 1/8   Microbiology results: 1/1 BCx x 2 >> canceled 1/1 Flu/Covid negative 1/3 resp cx  GPC, moderate yeast > few candida albicans 1/3 Bcx ngF 1/8 TA  1/8 Bcx NGTD   Varney Daily, PharmD PGY1 Pharmacy Resident  Please check AMION for all [facility] pharmacy phone numbers After 10:00  PM call main pharmacy [phone]

## 2021-11-07 NOTE — Evaluation (Signed)
Clinical/Bedside Swallow Evaluation Patient Details  Name: [name] MRN: [id number] Date of Birth: [birth year]  Today's Date: 11/07/2021 Time: SLP Start Time (ACUTE ONLY): 59 SLP Stop Time (ACUTE ONLY): 1101 SLP Time Calculation (min) (ACUTE ONLY): 31 min  Past Medical History:  Past Medical History:  Diagnosis Date   Anxiety    Arthritis    Asthma    Depression    Diabetes mellitus without complication (HCC)    Fibromyalgia    GERD (gastroesophageal reflux disease)    Hyperlipemia    Hypertension    PONV (postoperative nausea and vomiting)    Past Surgical History:  Past Surgical History:  Procedure Laterality Date   COLONOSCOPY     DILATION AND CURETTAGE OF UTERUS     IR EMBO ART  VEN HEMORR LYMPH EXTRAV  INC GUIDE ROADMAPPING  11/01/2021   IR THROMBECT PRIM MECH INIT (INCLU) MOD SED  10/29/2021   IR US GUIDE VASC ACCESS RIGHT  10/29/2021   IR US GUIDE VASC ACCESS RIGHT  11/01/2021   KNEE ARTHROSCOPY  2003,2005   left   SHOULDER ARTHROSCOPY     left   TRIGGER FINGER RELEASE  08/13/2012   Procedure: RELEASE TRIGGER FINGER/A-1 PULLEY;  Surgeon: [name]., MD;  Location: [facility];  Service: Orthopedics;  Laterality: Right;  long finger   TUBAL LIGATION     HPI:  Pt is a 71 yo female presenting 1/1 after prolonged OOH VF arrest (20 min of CPR, also coded 2x in ED, 4 min and 3 min) secondary to massive PE complicated by DIC. She is s/p thrombectomy on 1/2. Hospital course further complicated by AKI requiring CRRT, hemorrhagic shock due to hepatic bleeding, vomiting wtih TF. PMH includes DM, HTN, HLD, GERD, anxiety, depression, fibromyalgia    Assessment / Plan / Recommendation  Clinical Impression  Pt has evidence of an acute, post-extubation dysphagia, including significant dysphonia that borderlines aphonia, generalized oral weakness noted during oral motor exam, and repetitive swallowing with each bolus. Coughing is noted only once, but it is  subtle and appears to be quite weak even when cued to cough more volitionally. Suspect that risk for aspiration, particularly silent aspiration, could be high, and that with reduced functional reserve she is also at a higher risk for dysphagia related adverse events. Would remain NPO except for a few pieces of ice after oral care. SLP will f/u with likely need for instrumental testing prior to PO intake when ready. SLP Visit Diagnosis: Dysphagia, unspecified (R13.10)    Aspiration Risk  Severe aspiration risk    Diet Recommendation NPO;Ice chips PRN after oral care   Medication Administration: Via alternative means    Other  Recommendations Oral Care Recommendations: Oral care QID;Oral care prior to ice chip/H20    Recommendations for follow up therapy are one component of a multi-disciplinary discharge planning process, led by the attending physician.  Recommendations may be updated based on patient status, additional functional criteria and insurance authorization.  Follow up Recommendations Acute inpatient rehab (3hours/day)      Assistance Recommended at Discharge Intermittent Supervision/Assistance  Functional Status Assessment Patient has had a recent decline in their functional status and demonstrates the ability to make significant improvements in function in a reasonable and predictable amount of time.  Frequency and Duration min 2x/week  2 weeks       Prognosis Prognosis for Safe Diet Advancement: Good      Swallow Study   General HPI: Pt  is a 71 yo female presenting 1/1 after prolonged OOH VF arrest (20 min of CPR, also coded 2x in ED, 4 min and 3 min) secondary to massive PE complicated by DIC. She is s/p thrombectomy on 1/2. Hospital course further complicated by AKI requiring CRRT, hemorrhagic shock due to hepatic bleeding, vomiting wtih TF. PMH includes DM, HTN, HLD, GERD, anxiety, depression, fibromyalgia Type of Study: Bedside Swallow Evaluation Previous Swallow  Assessment: none in chart Diet Prior to this Study: NPO Temperature Spikes Noted: Yes (100.4) Respiratory Status: Nasal cannula History of Recent Intubation: Yes Length of Intubations (days): 10 days Date extubated: 11/06/21 Behavior/Cognition: Alert;Cooperative;Requires cueing Oral Cavity Assessment: Lesions (pt with small red lesions mostly around hard palate, question from ETT?) Oral Care Completed by SLP: Recent completion by staff Oral Cavity - Dentition: Dentures, not available;Missing dentition Vision: Functional for self-feeding Self-Feeding Abilities: Total assist Patient Positioning: Partially reclined (use of reverse trendelenburg due to HD access) Baseline Vocal Quality: Hoarse;Breathy (mostly aphonic) Volitional Cough: Weak Volitional Swallow: Able to elicit    Oral/Motor/Sensory Function Overall Oral Motor/Sensory Function: Generalized oral weakness   Ice Chips Ice chips: Impaired Presentation: Spoon Pharyngeal Phase Impairments: Multiple swallows   Thin Liquid Thin Liquid: Impaired Presentation: Spoon;Straw Pharyngeal  Phase Impairments: Multiple swallows;Cough - Immediate    Nectar Thick Nectar Thick Liquid: Not tested   Honey Thick Honey Thick Liquid: Not tested   Puree Puree: Impaired Presentation: Spoon Pharyngeal Phase Impairments: Multiple swallows   Solid     Solid: Not tested      Osie Bond., M.A. [place] Pager [phone] Office [phone]  11/07/2021,12:42 PM

## 2021-11-07 NOTE — Progress Notes (Signed)
eLink Physician-Brief Progress Note Patient Name: [name] DOB: 1951/03/18 MRN: [id number]   Date of Service  11/07/2021  HPI/Events of Note  Notified of platelet of 5 Inquiry received if need to work up for HITT It appears platelets has been low for almost a week and continues to trend down. Consumptive vs dilutional. Also on CRRT.  eICU Interventions  Will transfuse platelets as with history of spontaneous bleed     Intervention Category Intermediate Interventions: Other:  Judd Lien 11/07/2021, 6:34 AM

## 2021-11-07 NOTE — Progress Notes (Signed)
[facility] Progress Note     Assessment/ Plan:   Renal failure secondary to ATN in the setting of cardiac arrest, hypotension, pressors, contrast.  BL CKD3A with BL creatinine in the 1.17-1.3 range in late 2021. - R fem HD cath - started CRRT 10/31/20 - changed to all 4K bath1/9/23 - UF goal 100-400 mL/ min net neg as BP allows - now that she is extubated and is on minimal pressor- we will stop CRRT today around 1400 and follow renal function and UOP to assess daily for intermittent HD needs   Prolonged VF cardiac arrest--> OOH VF arrest 2/2 massive PE Massive PE s/p mechanical thrombectomy 1/2 of R and L PA Retroperitoneal bleed/Hemorrhagic shock/Acute blood loss anemia originating from left liver lobe s/p embolization 1/4--> required FFP, plts, Vit K, pRBCs Thrombocytopenia- DIC vs HIT vs other; on hep gtt d/t #3, per primary DM HTN Acute hypoxic RF- on vent Sepsis/ leukocytosis- on meropenem, WBC ct increasing to 25.1, cultures negative so far Dispo: ICU  Subjective:    Extubated yesterday- she is talking and interacting.  Plts 5 today, getting transfusion, HIT workup pending.     Objective:   BP (!) 110/50    Pulse 91    Temp 99.1 F (37.3 C)    Resp 19    Ht 5\' 5"  (1.651 m)    Wt 79 kg    SpO2 99%    BMI 28.98 kg/m   Intake/Output Summary (Last 24 hours) at 11/07/2021 1017 Last data filed at 11/07/2021 1000 Gross per 24 hour  Intake 1536.16 ml  Output 5199 ml  Net -3662.84 ml   Weight change: -3.2 kg  Physical Exam: GEN: NAD, sitting in bed, talking HEENT:  sclerae anicteric NECK: Supple, no thyromegaly LUNGS: decreased air movement bilaterally CV: Tachy ABD: soft, nontender EXT: 2+ LE edema, in heel protectors, UE edema nearly resolved ACCESS: L fem nontunneled HD cath   Imaging: VAS [name] LOWER EXTREMITY VENOUS (DVT)  Result Date: 11/06/2021  Lower Venous DVT Study Patient Name:  [name]  Date of Exam:   11/06/2021 Medical Rec #:  [id number]        Accession #:    [id number] Date of Birth: 27-Mar-1951        Patient Gender: F Patient Age:   71 years Exam Location:  [facility] Procedure:      VAS [name] LOWER EXTREMITY VENOUS (DVT) Referring Phys: Noemi Chapel --------------------------------------------------------------------------------  Indications: Pain.  Risk Factors: None identified. Limitations: Body habitus, poor ultrasound/tissue interface and patient positioning, patient immobility. Comparison Study: 11/02/2021 - Negative for DVT bilaterally. Performing Technologist: Oliver Hum RVT  Examination Guidelines: A complete evaluation includes B-mode imaging, spectral Doppler, color Doppler, and power Doppler as needed of all accessible portions of each vessel. Bilateral testing is considered an integral part of a complete examination. Limited examinations for reoccurring indications may be performed as noted. The reflux portion of the exam is performed with the patient in reverse Trendelenburg.  +---------+---------------+---------+-----------+----------+--------------+  RIGHT     Compressibility Phasicity Spontaneity Properties Thrombus Aging  +---------+---------------+---------+-----------+----------+--------------+  CFV       Full            Yes       Yes                                    +---------+---------------+---------+-----------+----------+--------------+  SFJ  Full                                                             +---------+---------------+---------+-----------+----------+--------------+  FV Prox   Full                                                             +---------+---------------+---------+-----------+----------+--------------+  FV Mid    Full                                                             +---------+---------------+---------+-----------+----------+--------------+  FV Distal                 Yes       Yes                                     +---------+---------------+---------+-----------+----------+--------------+  PFV       Full                                                             +---------+---------------+---------+-----------+----------+--------------+  POP       Full            Yes       Yes                                    +---------+---------------+---------+-----------+----------+--------------+  PTV       Full                                                             +---------+---------------+---------+-----------+----------+--------------+  PERO      Full                                                             +---------+---------------+---------+-----------+----------+--------------+   +----+---------------+---------+-----------+----------+--------------+  LEFT Compressibility Phasicity Spontaneity Properties Thrombus Aging  +----+---------------+---------+-----------+----------+--------------+  CFV  Full            Yes       Yes                                    +----+---------------+---------+-----------+----------+--------------+  Summary: RIGHT: - There is no evidence of deep vein thrombosis in the lower extremity. However, portions of this examination were limited- see technologist comments above.  - No cystic structure found in the popliteal fossa.  LEFT: - No evidence of common femoral vein obstruction.  *See table(s) above for measurements and observations. Electronically signed by Harold Barban MD on 11/06/2021 at 9:44:21 PM.    Final     Labs: BMET Recent Labs  Lab 11/04/21 0539 11/04/21 1606 11/05/21 0500 11/05/21 1631 11/06/21 0356 11/06/21 1615 11/07/21 0416  NA 134* 133* 137 136 136 134* 136  K 3.8 3.6 3.4* 3.5 3.6 3.3* 3.8  CL 101 101 103 103 102 103 105  CO2 21* 18* 22 17* 19* 21* 21*  GLUCOSE 177* 147* 151* 170* 236* 268* 143*  BUN 36* 37* 37* 37* 40* 44* 40*  CREATININE 2.43* 2.42* 2.32* 2.25* 2.19* 2.13* 1.93*  CALCIUM 7.5* 7.3* 7.3* 7.3* 7.5* 7.5* 7.8*  PHOS 2.5 2.6 2.6 2.4* 2.6 2.7  2.7   CBC Recent Labs  Lab 11/04/21 1606 11/05/21 0500 11/05/21 0836 11/06/21 0356 11/07/21 0416  WBC 20.2*  --  23.6* 22.7* 25.1*  HGB 8.5*  --  9.5* 8.8* 8.9*  HCT 25.8*  --  28.4* 25.6* 26.9*  MCV 86.9  --  88.8 86.2 87.6  PLT 26*   26* 16* 17* 18*   17* 5*    Medications:     atorvastatin  20 mg Per Tube Daily   B-complex with vitamin C  1 tablet Per Tube Daily   Chlorhexidine Gluconate Cloth  6 each Topical Daily   feeding supplement (PROSource TF)  90 mL Per Tube 5 X Daily   gabapentin  100 mg Per Tube Q12H   hydrocortisone  25 mg Rectal BID   insulin aspart  2-6 Units Subcutaneous Q4H   insulin detemir  5 Units Subcutaneous BID   mouth rinse  15 mL Mouth Rinse BID   pantoprazole (PROTONIX) IV  40 mg Intravenous Q1400   sodium chloride flush  10-40 mL Intracatheter Q12H      Madelon Lips, MD 11/07/2021, 10:17 AM

## 2021-11-07 NOTE — Consult Note (Signed)
Referral MD  Reason for Referral: Severe thrombocytopenia; massive pulmonary embolism; hepatic and acute renal failure  Chief Complaint  Patient presents with   Cardiac Arrest  : Patient really cannot give any history.  HPI: [name] is a very charming 71 year old African-American female.  She has a incredibly fascinating hospital history.  She has been in good health.  Apparently, on January 1, she had a cardiac arrest.  She had out of the hospital CPR.  She came to the hospital.  She had a large saddle embolus.  She subsequently was intubated.  She required pressor support.  She had a consumptive coagulopathy which was felt to be DIC.  She had a hematoma.  She had upper GI bleeding.  When she came in, her CBC showed a white cell count 3.3.  Hemoglobin 5.5 platelet count 13,000.  Again is not clear if this is a true value.  Her sodium was 148.  Potassium 2.9.  BUN 16 creatinine 1.1.  Her BUN and creatinine subsequently increased to 27 2.69.  Her blood sugar was 472.  She had profound hepatic inflammation with a AST of 2275 and ALT of 1210.  Her bilirubin was 1.3.  I think she received some blood in platelets.  Her beta natruretic peptide was 1125.  Patient had echocardiogram on admission which showed ejection fraction of 70-75%.  I think she had right ventricular strain.  A CT angiogram of the chest on admission showed a saddle pulmonary embolism.  She had right heart strain.  There was a likely mass in the liver.  CT of the brain was unremarkable when she came in.  She had Dopplers of her legs done on 11/02/2021 which were negative for any thromboembolic disease.  She did undergo mechanical thrombectomy which was quite successful on 10/29/2021.  She has been placed on heparin.  Her platelet count went up to 197,000.  As such, I just have a hard time believing that the initial platelet count 13,000 was real.  Her hemoglobin up to 11.9.  She did have persistent thrombocytosis with a white  cell count of 23,700.  Her platelet count remained relatively stable.  This lasted for about a day.  On 10/30/2021, white cell count was 19.4.  Hemoglobin 10.5.  Platelet count 101,000.  On January 4, her platelet count was down to 65,000.  On 5 January, platelet count was 158,000.  He began to decline again.  On the seventh, it was 33,000.  On the ninth, the platelet count was 16,000.  Of note she had more nucleated red blood cells noted.   Today, her white cell count is 25.1.  Hemoglobin 8.9.  Platelet count was 5000.  She was given platelets.  Her platelet count is not increased at all.  She has been on dialysis.  She stopped dialysis today.  Her BUN was 40 creatinine 1.93.  All cultures are negative.  She is on meropenem.  She had Dopplers done of her legs on 11/06/2021.  Again, these were negative.  The heparin was stopped today because of the profound thrombocytopenia.  Her D-dimer was 14.2.  Her fibrinogen was 725.  Her PT was 14.7 with an INR of 1.2.  Her last liver function studies done on 11/02/2021 show a AST of 728 and ALT of 670.  Her bilirubin is 2.0.  She was tested for heparin-induced thrombocytopenia (HIT) and this test was negative.  We were asked to see her because of the profound thrombocytopenia.  There is no  problems with bleeding.  She really does not talk all that much.  Thankfully her daughter is with her who is incredibly helpful with information.  [name] does have sickle cell trait.  She has never had any Blood problems before.  Is no history in the family of blood problems outside of the sickle cell trait.  She has no obvious alcohol use.  There is no history of cirrhosis.  Currently, I would say performance status is probably ECOG 3.   Past Medical History:  Diagnosis Date   Anxiety    Arthritis    Asthma    Depression    Diabetes mellitus without complication (HCC)    Fibromyalgia    GERD (gastroesophageal reflux disease)    Hyperlipemia     Hypertension    PONV (postoperative nausea and vomiting)   :   Past Surgical History:  Procedure Laterality Date   COLONOSCOPY     DILATION AND CURETTAGE OF UTERUS     IR EMBO ART  VEN HEMORR LYMPH EXTRAV  INC GUIDE ROADMAPPING  11/01/2021   IR THROMBECT PRIM MECH INIT (INCLU) MOD SED  10/29/2021   IR US GUIDE VASC ACCESS RIGHT  10/29/2021   IR US GUIDE VASC ACCESS RIGHT  11/01/2021   KNEE ARTHROSCOPY  2003,2005   left   SHOULDER ARTHROSCOPY     left   TRIGGER FINGER RELEASE  08/13/2012   Procedure: RELEASE TRIGGER FINGER/A-1 PULLEY;  Surgeon: [name]., MD;  Location: [facility];  Service: Orthopedics;  Laterality: Right;  long finger   TUBAL LIGATION    :   Current Facility-Administered Medications:     prismasol BGK 4/2.5 infusion, , CRRT, Continuous, Dwana Melena, MD, Last Rate: 400 mL/hr at 11/07/21 1249, New Bag at 11/07/21 1249    prismasol BGK 4/2.5 infusion, , CRRT, Continuous, Dwana Melena, MD, Last Rate: 200 mL/hr at 11/07/21 0016, New Bag at 11/07/21 0016   Place/Maintain arterial line, , , Until Discontinued **AND** 0.9 %  sodium chloride infusion, , Intra-arterial, PRN, Tegeler, Gwenyth Allegra, MD   0.9 %  sodium chloride infusion, 250 mL, Intravenous, Continuous, Ollis, Brandi L, NP, Last Rate: 10 mL/hr at 11/07/21 1302, 250 mL at 11/07/21 1302   0.9 %  sodium chloride infusion, , Intravenous, PRN, Julian Hy, DO, Last Rate: 10 mL/hr at 11/06/21 1601, Infusion Verify at 11/06/21 1601   acetaminophen (TYLENOL) tablet 650 mg, 650 mg, Oral, Q4H PRN **OR** acetaminophen (TYLENOL) 160 MG/5ML solution 650 mg, 650 mg, Per Tube, Q4H PRN, 650 mg at 11/06/21 0845 **OR** acetaminophen (TYLENOL) suppository 650 mg, 650 mg, Rectal, Q4H PRN, Ollis, Brandi L, NP   atorvastatin (LIPITOR) tablet 20 mg, 20 mg, Per Tube, Daily, Noemi Chapel P, DO, 20 mg at 11/06/21 8502   B-complex with vitamin C tablet 1 tablet, 1 tablet, Per Tube, Daily, Margaretha Seeds, MD, 1  tablet at 11/06/21 7741   Chlorhexidine Gluconate Cloth 2 % PADS 6 each, 6 each, Topical, Daily, Margaretmary Lombard, MD, 6 each at 11/06/21 2226   diphenhydrAMINE (BENADRYL) injection 12.5 mg, 12.5 mg, Intravenous, Q6H PRN, Noemi Chapel P, DO   docusate sodium (COLACE) capsule 100 mg, 100 mg, Oral, BID PRN, Ollis, Brandi L, NP   feeding supplement (PROSource TF) liquid 90 mL, 90 mL, Per Tube, 5 X Daily, Noemi Chapel P, DO, 90 mL at 11/06/21 1801   feeding supplement (VITAL 1.5 CAL) liquid 1,000 mL, 1,000 mL, Per Tube,  Continuous, Julian Hy, DO, Last Rate: 55 mL/hr at 11/06/21 1305, 1,000 mL at 11/06/21 1305   gabapentin (NEURONTIN) 250 MG/5ML solution 100 mg, 100 mg, Per Tube, Q12H, Julian Hy, DO   Gerhardt's butt cream, , Topical, PRN, Noemi Chapel P, DO   heparin injection 1,000-6,000 Units, 1,000-6,000 Units, CRRT, PRN, Dwana Melena, MD, 2,800 Units at 11/07/21 1413   hydrocortisone (ANUSOL-HC) suppository 25 mg, 25 mg, Rectal, BID, Julian Hy, DO, 25 mg at 11/07/21 0900   insulin aspart (novoLOG) injection 2-6 Units, 2-6 Units, Subcutaneous, Q4H, Julian Hy, DO, 2 Units at 11/07/21 0801   insulin detemir (LEVEMIR) injection 5 Units, 5 Units, Subcutaneous, BID, Julian Hy, DO, 5 Units at 11/06/21 1047   ipratropium-albuterol (DUONEB) 0.5-2.5 (3) MG/3ML nebulizer solution 3 mL, 3 mL, Nebulization, Q4H PRN, Noemi Chapel P, DO   labetalol (NORMODYNE) injection 10 mg, 10 mg, Intravenous, Q2H PRN, Cecilie Lowers T, MD, 10 mg at 11/05/21 0116   MEDLINE mouth rinse, 15 mL, Mouth Rinse, BID, Noemi Chapel P, DO, 15 mL at 11/07/21 0904   norepinephrine (LEVOPHED) 3m in 2561m(0.016 mg/mL) premix infusion, 0-10 mcg/min, Intravenous, Titrated, ClJulian HyDO, Stopped at 11/07/21 0833   ondansetron (ZOFRAN) injection 4 mg, 4 mg, Intravenous, Q6H PRN, Ollis, Brandi L, NP   oxyCODONE (ROXICODONE) 5 MG/5ML solution 5 mg, 5 mg, Per Tube, Q4H PRN, ClNoemi Chapel, DO, 5 mg at 11/06/21 1314    pantoprazole (PROTONIX) injection 40 mg, 40 mg, Intravenous, Q1400, ClJulian HyDO, 40 mg at 11/07/21 1309   prismasol BGK 4/2.5 infusion, , CRRT, Continuous, UpMadelon LipsMD, Last Rate: 1,500 mL/hr at 11/07/21 1247, New Bag at 11/07/21 1247   sodium chloride 0.9 % primer fluid for CRRT, , CRRT, PRN, LiDwana MelenaMD   sodium chloride flush (NS) 0.9 % injection 10-40 mL, 10-40 mL, Intracatheter, Q12H, MaZachery Conch, MD, 10 mL at 11/07/21 0735   sodium chloride flush (NS) 0.9 % injection 10-40 mL, 10-40 mL, Intracatheter, PRN, MaAlvan DameMD   white petrolatum (VASELINE) gel, , Topical, PRN, ElMargaretha SeedsMD:   atorvastatin  20 mg Per Tube Daily   B-complex with vitamin C  1 tablet Per Tube Daily   Chlorhexidine Gluconate Cloth  6 each Topical Daily   feeding supplement (PROSource TF)  90 mL Per Tube 5 X Daily   gabapentin  100 mg Per Tube Q12H   hydrocortisone  25 mg Rectal BID   insulin aspart  2-6 Units Subcutaneous Q4H   insulin detemir  5 Units Subcutaneous BID   mouth rinse  15 mL Mouth Rinse BID   pantoprazole (PROTONIX) IV  40 mg Intravenous Q1400   sodium chloride flush  10-40 mL Intracatheter Q12H  :   Allergies  Allergen Reactions   Aspirin Nausea And Vomiting    Other reaction(s): Unknown   Benadryl [Diphenhydramine]     Can only take dye free. States the dye causes itching.   Darvon [Propoxyphene] Itching    Can only during the day. Night time makes her itch   Hydrocodone Bit-Homatrop Mbr Itching   Metformin     Other reaction(s): GI   Other     Other reaction(s): Unknown. States reaction was to nasal spray that caused pupils to shrink   Pentazocine     nervous Other reaction(s): jitters   Percocet [Oxycodone-Acetaminophen]     itching   Sulfa Antibiotics Hives   Tetracaine  Hcl     Other reaction(s): Unknown. Thinks caused itching.   Tetracyclines & Related Hives   Tramadol     Other reaction(s): itch  :   Family History  Problem  Relation Age of Onset   Diabetes Mother    Heart attack Father    Diabetes Sister    Throat cancer Brother    Hypertension Daughter   :   Social History   Socioeconomic History   Marital status: Divorced    Spouse name: Not on file   Number of children: Not on file   Years of education: Not on file   Highest education level: Not on file  Occupational History   Not on file  Tobacco Use   Smoking status: Former    Packs/day: 0.50    Years: 20.00    Pack years: 10.00    Types: Cigarettes    Quit date: 08/10/2010    Years since quitting: 11.2   Smokeless tobacco: Never  Vaping Use   Vaping Use: Never used  Substance and Sexual Activity   Alcohol use: No   Drug use: No   Sexual activity: Not on file  Other Topics Concern   Not on file  Social History Narrative   Not on file   Social Determinants of Health   Financial Resource Strain: Not on file  Food Insecurity: Not on file  Transportation Needs: Not on file  Physical Activity: Not on file  Stress: Not on file  Social Connections: Not on file  Intimate Partner Violence: Not on file  :  Pertinent items are noted in HPI.  Exam:   This is a chronic ill-appearing African-American female.  She is in no obvious distress.  All her vital signs show temperature of 99.7.  Pulse 99.  Blood pressure is 100/50.  Her head neck exam shows no adenopathy.  There is no palpable thyroid.  Her lungs sound relatively clear bilaterally.  Cardiac exam tachycardic but regular.  There are no murmurs.  Abdomen is soft.  Bowel sounds are decreased.  There is no obvious fluid wave.  I really cannot palpate her liver or spleen.  Extremities shows some mild edema in her legs bilaterally.  Skin exam shows some small scattered ecchymoses.  Neurological exam shows no focal deficits.   Patient Vitals for the past 24 hrs:  BP Temp Temp src Pulse Resp SpO2 Weight  11/07/21 1600 (!) 116/56 -- -- (!) 101 20 97 % --  11/07/21 1530 -- 99.7 F (37.6  C) -- 99 19 97 % --  11/07/21 1515 -- 99.2 F (37.3 C) -- 98 19 98 % --  11/07/21 1500 126/62 -- -- 97 20 98 % --  11/07/21 1400 (!) 95/58 -- -- 90 16 91 % --  11/07/21 1300 (!) 99/59 -- -- 93 17 100 % --  11/07/21 1205 98/60 -- -- 92 17 96 % --  11/07/21 1200 -- 98.7 F (37.1 C) Axillary 90 17 100 % --  11/07/21 1100 (!) 93/48 98.4 F (36.9 C) Axillary 95 17 99 % --  11/07/21 1045 (!) 102/59 -- -- 90 19 97 % --  11/07/21 1030 (!) 103/55 -- -- 86 16 95 % --  11/07/21 1015 (!) 110/50 -- -- 91 19 99 % --  11/07/21 1000 (!) 105/55 99.1 F (37.3 C) -- 94 19 95 % --  11/07/21 0945 108/67 99 F (37.2 C) -- -- 16 96 % --  11/07/21 0930 (!) 95/55 -- --  93 (!) 22 100 % --  11/07/21 0915 (!) 104/59 -- -- 93 18 95 % --  11/07/21 0900 (!) 105/91 -- -- 95 17 98 % --  11/07/21 0845 (!) 104/57 -- -- 97 20 97 % --  11/07/21 0830 (!) 123/55 -- -- 95 17 95 % --  11/07/21 0815 (!) 104/57 -- -- 94 17 99 % --  11/07/21 0800 (!) 84/56 -- -- 93 19 97 % --  11/07/21 0750 -- 99.3 F (37.4 C) -- -- -- -- --  11/07/21 0745 (!) 109/50 -- -- 94 20 100 % --  11/07/21 0741 (!) 109/50 97.6 F (36.4 C) -- 94 19 -- --  11/07/21 0730 (!) 105/51 -- -- 94 19 97 % --  11/07/21 0726 92/61 97.6 F (36.4 C) Oral 93 19 97 % --  11/07/21 0700 (!) 107/52 -- -- 88 16 99 % --  11/07/21 0645 (!) 126/59 -- -- 93 16 99 % --  11/07/21 0630 (!) 112/56 -- -- 97 20 98 % --  11/07/21 0615 106/63 (!) 97.5 F (36.4 C) Oral 96 16 98 % --  11/07/21 0600 (!) 95/57 -- -- 90 17 97 % --  11/07/21 0545 (!) 96/55 -- -- 95 18 96 % --  11/07/21 0530 107/60 -- -- 97 18 97 % --  11/07/21 0515 (!) 94/57 -- -- 94 16 96 % --  11/07/21 0500 (!) 119/59 -- -- 91 16 98 % 174 lb 2.6 oz (79 kg)  11/07/21 0445 (!) 107/59 -- -- 93 16 95 % --  11/07/21 0430 (!) 137/58 -- -- 88 17 97 % --  11/07/21 0415 109/62 -- -- 85 15 97 % --  11/07/21 0400 (!) 105/59 (!) 97.5 F (36.4 C) Oral 88 17 98 % --  11/07/21 0345 (!) 100/56 -- -- 86 16 98 % --   11/07/21 0330 (!) 92/59 -- -- 91 17 100 % --  11/07/21 0315 99/60 -- -- 87 16 97 % --  11/07/21 0300 (!) 98/54 -- -- 88 16 93 % --  11/07/21 0245 (!) 111/59 -- -- 90 17 97 % --  11/07/21 0230 (!) 117/96 -- -- 86 16 97 % --  11/07/21 0215 115/62 -- -- 91 17 96 % --  11/07/21 0200 (!) 108/58 -- -- 89 16 97 % --  11/07/21 0145 95/61 -- -- 88 17 97 % --  11/07/21 0130 (!) 84/53 -- -- 86 15 98 % --  11/07/21 0115 (!) 91/57 -- -- 85 16 98 % --  11/07/21 0100 (!) 106/55 -- -- 93 18 98 % --  11/07/21 0045 (!) 89/56 -- -- 90 16 96 % --  11/07/21 0030 (!) 90/54 -- -- 83 15 96 % --  11/07/21 0015 (!) 105/58 -- -- 88 15 99 % --  11/07/21 0000 (!) 92/58 -- -- 91 16 96 % --  11/06/21 2356 -- 97.6 F (36.4 C) Oral -- -- -- --  11/06/21 2345 (!) 90/59 -- -- 89 18 96 % --  11/06/21 2330 (!) 94/49 -- -- 91 18 95 % --  11/06/21 2315 (!) 97/54 -- -- 91 16 96 % --  11/06/21 2300 (!) 103/55 -- -- 91 16 96 % --  11/06/21 2245 (!) 99/50 -- -- 94 18 97 % --  11/06/21 2230 (!) 95/58 -- -- 96 16 97 % --  11/06/21 2215 113/89 -- -- 93 18 94 % --  11/06/21 2200 100/68 -- --  96 20 96 % --  11/06/21 2145 (!) 92/58 -- -- 94 (!) 25 98 % --  11/06/21 2130 111/60 -- -- -- (!) 27 -- --  11/06/21 2115 (!) 101/59 -- -- 92 (!) 30 97 % --  11/06/21 2100 (!) 108/54 -- -- 95 (!) 24 96 % --  11/06/21 2045 91/60 -- -- 97 (!) 25 98 % --  11/06/21 2030 (!) 100/58 -- -- 92 (!) 29 97 % --  11/06/21 2015 (!) 115/56 -- -- (!) 102 19 94 % --  11/06/21 2000 99/65 -- -- 95 (!) 23 97 % --  11/06/21 1945 (!) 86/68 -- -- (!) 101 19 97 % --  11/06/21 1933 -- 98.5 F (36.9 C) Axillary -- -- -- --  11/06/21 1900 90/73 -- -- (!) 111 (!) 22 97 % --  11/06/21 1845 (!) 114/58 -- -- (!) 114 (!) 22 96 % --  11/06/21 1830 105/61 -- -- (!) 104 (!) 22 95 % --  11/06/21 1824 -- -- -- (!) 113 (!) 23 96 % --  11/06/21 1815 96/60 -- -- (!) 101 (!) 22 97 % --  11/06/21 1800 (!) 100/56 -- -- 99 (!) 21 96 % --  11/06/21 1745 (!) 115/56 -- -- 90  17 95 % --  11/06/21 1739 -- -- -- 93 19 95 % --  11/06/21 1730 120/65 -- -- 93 19 93 % --  11/06/21 1715 111/62 -- -- 91 18 95 % --  11/06/21 1700 122/67 -- -- 96 (!) 22 95 % --  11/06/21 1645 125/63 -- -- (!) 104 (!) 24 94 % --  11/06/21 1630 133/62 -- -- 94 (!) 22 94 % --      Recent Labs    11/07/21 0416 11/07/21 1334 11/07/21 1500  WBC 25.1* 25.0*  --   HGB 8.9* 8.7*  --   HCT 26.9* 26.3*  --   PLT 5* 5* <5*    Recent Labs    11/07/21 0416 11/07/21 1523  NA 136 134*  K 3.8 4.0  CL 105 100  CO2 21* 19*  GLUCOSE 143* 224*  BUN 40* 42*  CREATININE 1.93* 2.22*  CALCIUM 7.8* 7.9*    Blood smear review: Normochromic and normocytic population of red blood cells.  There is some polychromasia.  I see no schistocytes.  She has a couple spherocytes.  I see a rare nucleated red blood cell.  White blood cells are increased in number.  She has mostly mature polys.  I do not see any hypersegmented polys.  There are no blasts.  Platelets are very rare.  The platelet that I do see is large and well granulated.  Pathology: None    Assessment and Plan: [name] is a very charming 71 year old Afro-American female.  She had an acute event with a cardiac arrest.  She had a massive pulmonary embolism.  Again I am unsure as to why she would have the pulmonary embolism.  She does not have any obvious risk factors.  Having sickle cell trait should not be a risk factor for pulmonary emboli.  She then developed a shock liver and shock kidneys.  When she came in, her her liver tests were incredibly elevated.  She required dialysis.  I have to believe that she, to some degree has a "shock" bone marrow.  As such, she may have thrombocytopenia for a while.  However, the platelets that I see are young platelets as they are large and  well granulated.  As such she seems to be making platelets.  I do not see any evidence of a microangiopathic hemolytic process (i.e. TTP).  I am not sure is why  she did not respond to platelet transfusions.  I suppose she could always develop alloimmunization but this would be highly unusual given that she has not had transfusions before.  It is possible that medication that she could be on could do this.  Even though the heparin associated thrombocytopenia test was negative, I agree with stopping the heparin.  Even though she has profound thrombocytopenia, she is still at significant risk for thromboembolic disease.  She will need to be on an agent for this.  I know that she has a renal insufficiency but I would think that Angiomax could be dosed by pharmacy.  Looks like her renal function is improving.  A bone marrow biopsy could certainly help [name] out in determining whether or not she is making pleasant has immune thrombocytopenia.  I suppose that she could have an immune thrombocytopenia.  As such, I probably would try to treat her as if she had a form of ITP.  I will give her a dose of Nplate.  I think this would be okay for her.  I would probably start her on some steroids.  I know this is incredibly difficult and complicated.  Hopefully, we will see some response in the next few days.  I just suspect that given that she had this shock liver and acute renal failure, that her bone marrow to some degree took a "hit" and a lot of times, platelets are first affected.  Since she is not bleeding, I would probably hold off on transfusing her.  Again I am not sure as to why she would have had this massive pulmonary embolus.  1 diagnosis that could tile this together would be antiphospholipid antibody syndrome.  I will check for that.  However, it is not unusual to get a false positive in the acute setting.  Again, her daughter is very nice.  Her daughter was quite helpful in providing some information.  We will follow along and try to help as much as we can to get her platelet count back up.  I still believe that when she came in, her platelet count was  actually okay.  As such, I would have to believe that her platelet count should be able to get back to a "normal" level.  [name], MD  Jeneen Rinks 1: 5

## 2021-11-07 NOTE — Progress Notes (Signed)
Brief Nutrition Follow-up:  Platelets 5 this AM, after administration of Platelets x 2, platelets remains at 5.  Pt extubated yesterday, OG removed. TF up to 20 ml/hr from 10 ml/hr prior to extubation. Cortrak was planned today but platelets remain too low. No Cortrak service tomorrow. No plans to place Cortrak tube until platelets improve (20 per Cortrak policy, MD requesting 50)  Pt evaluated by SLP today and remains NPO with ice chips only.   Discussed plan in detail with MD Carlis Abbott. Plan to start TPN at this time. TPN cut off is 12 pm daily for new orders; plan to start TPN tomorrow  Kerman Passey MS, RDN, LDN, CNSC Registered Dietitian III Clinical Nutrition RD Pager and On-Call Pager Number Located in [place]
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
## 2021-11-07 NOTE — Progress Notes (Signed)
[facility] for heparin>Angiomax Indication: pulmonary embolus  Allergies  Allergen Reactions   Aspirin Nausea And Vomiting    Other reaction(s): Unknown   Benadryl [Diphenhydramine]     Can only take dye free. States the dye causes itching.   Darvon [Propoxyphene] Itching    Can only during the day. Night time makes her itch   Hydrocodone Bit-Homatrop Mbr Itching   Metformin     Other reaction(s): GI   Other     Other reaction(s): Unknown. States reaction was to nasal spray that caused pupils to shrink   Pentazocine     nervous Other reaction(s): jitters   Percocet [Oxycodone-Acetaminophen]     itching   Sulfa Antibiotics Hives   Tetracaine Hcl     Other reaction(s): Unknown. Thinks caused itching.   Tetracyclines & Related Hives   Tramadol     Other reaction(s): itch    Patient Measurements: Height: 5\' 5"  (165.1 cm) Weight: 79 kg (174 lb 2.6 oz) IBW/kg (Calculated) : 57  Vital Signs: Temp: 99.7 F (37.6 C) (01/11 1530) Temp Source: Axillary (01/11 1200) BP: 106/45 (01/11 1700) Pulse Rate: 102 (01/11 1700)  Labs: Recent Labs    11/05/21 0500 11/05/21 0836 11/06/21 0356 11/06/21 0617 11/06/21 1615 11/06/21 1746 11/07/21 0416 11/07/21 1334 11/07/21 1500 11/07/21 1523  HGB  --    < > 8.8*  --   --   --  8.9* 8.7*  --   --   HCT  --    < > 25.6*  --   --   --  26.9* 26.3*  --   --   PLT 16*   < > 18*   17*  --   --   --  5* 5* <5*  --   APTT 46*  --  55*  --   --   --   --   --  100*  --   LABPROT 16.0*  --  16.2*  --   --   --   --   --  14.7  --   INR 1.3*  --  1.3*  --   --   --   --   --  1.2  --   HEPARINUNFRC <0.10*   < >  --  <0.10*  --  0.33 0.39  --   --   --   CREATININE 2.32*   < > 2.19*  --  2.13*  --  1.93*  --   --  2.22*   < > = values in this interval not displayed.     Estimated Creatinine Clearance: 23.8 mL/min (A) (by C-G formula based on SCr of 2.22 mg/dL (H)).   Assessment: 71 yo female with  saddle PE - s/p thrombectomy and started on heparin - then stopped after intraperitoneal bleed.   Patient with low pltc 20s, D-Dimer back up >20, fibrinogen back up 570 and CRRT circuit clotting at least once per shift. Resume low dose heparin drip no bolus and up titrate slowly.  Heparin level now therapeutic (0.39) on gtt at 1150 units/hr. Hgb around 8.9 and plt count down to 5 this morning. She is HIT Ab negative and awaiting SRA results. She was given 2 transfusions of platelets this morning. Given she is therapeutic and being treated for PE. Will be conservative on dose adjustments with low platelet count. No signs or symptoms of bleeding.  Heme/onc consulted as platelets were 5 after 2 transfusions of  platelets. HIT Ab and SRA both were negative. Heme suspected that HIT could still be an issue and bivalirudin will be used for anticoagulation instead of heparin. She has poor renal function so we will start with the adjusted dose.   Goal of Therapy:  PTT goal 50-85 Monitor platelets by anticoagulation protocol: Yes   Plan:  Dc heparin Bivalirudin 0.05mg /kg/hr Check 4 hr PTT Daily PTT  Onnie Boer, PharmD, BCIDP, AAHIVP, CPP Infectious Disease Pharmacist 11/07/2021 5:18 PM

## 2021-11-07 NOTE — Progress Notes (Signed)
Platelets still 5 after 2 units this morning. PF4Ab negative. Cannot have cortrak placed with such severe thrombocytopenia. TPN consult placed by RD. Hopefully this will only be needed for a few days. Giving another 1 unit pRBCs. No plans to change AC from heparin to bival due to risk of excessive AC while off CRRT. May need to stop heparin all together if we cannot get platelets above 10 due to excessively high bleeding risk. No heparin boluses currently. Discussed with pharmacy. Consulting hematology. Repeat DIC panel-- has not been suggestive of DIC the past few days.   Julian Hy, DO 11/07/21 2:53 PM [facility]

## 2021-11-07 NOTE — Progress Notes (Addendum)
[name] for heparin  Indication: pulmonary embolus  Allergies  Allergen Reactions   Aspirin Nausea And Vomiting    Other reaction(s): Unknown   Benadryl [Diphenhydramine]     Can only take dye free. States the dye causes itching.   Darvon [Propoxyphene] Itching    Can only during the day. Night time makes her itch   Hydrocodone Bit-Homatrop Mbr Itching   Metformin     Other reaction(s): GI   Other     Other reaction(s): Unknown. States reaction was to nasal spray that caused pupils to shrink   Pentazocine     nervous Other reaction(s): jitters   Percocet [Oxycodone-Acetaminophen]     itching   Sulfa Antibiotics Hives   Tetracaine Hcl     Other reaction(s): Unknown. Thinks caused itching.   Tetracyclines & Related Hives   Tramadol     Other reaction(s): itch    Patient Measurements: Height: 5\' 5"  (165.1 cm) Weight: 79 kg (174 lb 2.6 oz) IBW/kg (Calculated) : 57  Vital Signs: Temp: 99.3 F (37.4 C) (01/11 0750) Temp Source: Oral (01/11 0726) BP: 104/57 (01/11 0815) Pulse Rate: 94 (01/11 0815)  Labs: Recent Labs    11/04/21 1606 11/04/21 1754 11/05/21 0500 11/05/21 0836 11/05/21 1631 11/06/21 0356 11/06/21 0617 11/06/21 1615 11/06/21 1746 11/07/21 0416  HGB 8.5*  --   --  9.5*  --  8.8*  --   --   --  8.9*  HCT 25.8*  --   --  28.4*  --  25.6*  --   --   --  26.9*  PLT 26*   26*  --  16* 17*  --  18*   17*  --   --   --  5*  APTT 34  --  46*  --   --  55*  --   --   --   --   LABPROT 15.9*  --  16.0*  --   --  16.2*  --   --   --   --   INR 1.3*  --  1.3*  --   --  1.3*  --   --   --   --   HEPARINUNFRC  --    < > <0.10*  --    < >  --  <0.10*  --  0.33 0.39  CREATININE 2.42*  --  2.32*  --    < > 2.19*  --  2.13*  --  1.93*   < > = values in this interval not displayed.     Estimated Creatinine Clearance: 27.4 mL/min (A) (by C-G formula based on SCr of 1.93 mg/dL (H)).   Assessment: 71 yo female with saddle  PE - s/p thrombectomy and started on heparin - then stopped after intraperitoneal bleed.   Patient with low pltc 20s, D-Dimer back up >20, fibrinogen back up 570 and CRRT circuit clotting at least once per shift. Resume low dose heparin drip no bolus and up titrate slowly.  Heparin level now therapeutic (0.39) on gtt at 1150 units/hr. Hgb around 8.9 and plt count down to 5 this morning. She is HIT Ab negative and awaiting SRA results. She was given 2 transfusions of platelets this morning. Given she is therapeutic and being treated for PE. Will be conservative on dose adjustments with low platelet count. No signs or symptoms of bleeding.  Heme/onc consulted as platelets were 5 after 2 transfusions of platelets. HIT  Ab and SRA both were negative and HIT was ruled out.  Goal of Therapy:  Heparin level 0.3-0.5 units/ml Monitor platelets by anticoagulation protocol: Yes   Plan:  Stopped heparin drip @1500  CBC in am Monitor s/s bleeding   Varney Daily, PharmD PGY1 Pharmacy Resident  Please check AMION for all [facility] pharmacy phone numbers After 10:00 PM call main pharmacy [phone]

## 2021-11-08 ENCOUNTER — Inpatient Hospital Stay (HOSPITAL_COMMUNITY): Payer: Medicare PPO

## 2021-11-08 DIAGNOSIS — E43 Unspecified severe protein-calorie malnutrition: Secondary | ICD-10-CM | POA: Insufficient documentation

## 2021-11-08 DIAGNOSIS — J9601 Acute respiratory failure with hypoxia: Secondary | ICD-10-CM | POA: Diagnosis not present

## 2021-11-08 DIAGNOSIS — I469 Cardiac arrest, cause unspecified: Secondary | ICD-10-CM | POA: Diagnosis not present

## 2021-11-08 DIAGNOSIS — D696 Thrombocytopenia, unspecified: Secondary | ICD-10-CM | POA: Diagnosis not present

## 2021-11-08 DIAGNOSIS — G9340 Encephalopathy, unspecified: Secondary | ICD-10-CM

## 2021-11-08 LAB — CBC
HCT: 23.4 % — ABNORMAL LOW (ref 36.0–46.0)
Hemoglobin: 7.9 g/dL — ABNORMAL LOW (ref 12.0–15.0)
MCH: 30 pg (ref 26.0–34.0)
MCHC: 33.8 g/dL (ref 30.0–36.0)
MCV: 89 fL (ref 80.0–100.0)
Platelets: <5 10*3/uL — CL (ref 150–400)
RBC: 2.63 MIL/uL — ABNORMAL LOW (ref 3.87–5.11)
RDW: 18.7 % — ABNORMAL HIGH (ref 11.5–15.5)
WBC: 23.8 10*3/uL — ABNORMAL HIGH (ref 4.0–10.5)
nRBC: 0.3 % — ABNORMAL HIGH (ref 0.0–0.2)

## 2021-11-08 LAB — GLUCOSE, CAPILLARY
Glucose-Capillary: 244 mg/dL — ABNORMAL HIGH (ref 70–99)
Glucose-Capillary: 222 mg/dL — ABNORMAL HIGH (ref 70–99)
Glucose-Capillary: 284 mg/dL — ABNORMAL HIGH (ref 70–99)
Glucose-Capillary: 234 mg/dL — ABNORMAL HIGH (ref 70–99)
Glucose-Capillary: 102 mg/dL — ABNORMAL HIGH (ref 70–99)
Glucose-Capillary: 144 mg/dL — ABNORMAL HIGH (ref 70–99)
Glucose-Capillary: 112 mg/dL — ABNORMAL HIGH (ref 70–99)
Glucose-Capillary: 120 mg/dL — ABNORMAL HIGH (ref 70–99)
Glucose-Capillary: 179 mg/dL — ABNORMAL HIGH (ref 70–99)
Glucose-Capillary: 184 mg/dL — ABNORMAL HIGH (ref 70–99)
Glucose-Capillary: 111 mg/dL — ABNORMAL HIGH (ref 70–99)
Glucose-Capillary: 242 mg/dL — ABNORMAL HIGH (ref 70–99)

## 2021-11-08 LAB — COMPREHENSIVE METABOLIC PANEL
ALT: 200 U/L — ABNORMAL HIGH (ref 0–44)
AST: 103 U/L — ABNORMAL HIGH (ref 15–41)
Albumin: 2.1 g/dL — ABNORMAL LOW (ref 3.5–5.0)
Alkaline Phosphatase: 177 U/L — ABNORMAL HIGH (ref 38–126)
Anion gap: 18 — ABNORMAL HIGH (ref 5–15)
BUN: 69 mg/dL — ABNORMAL HIGH (ref 8–23)
CO2: 21 mmol/L — ABNORMAL LOW (ref 22–32)
Calcium: 8.2 mg/dL — ABNORMAL LOW (ref 8.9–10.3)
Chloride: 101 mmol/L (ref 98–111)
Creatinine, Ser: 3.65 mg/dL — ABNORMAL HIGH (ref 0.44–1.00)
GFR, Estimated: 13 mL/min — ABNORMAL LOW (ref >60)
Glucose, Bld: 254 mg/dL — ABNORMAL HIGH (ref 70–99)
Potassium: 4.5 mmol/L (ref 3.5–5.1)
Sodium: 140 mmol/L (ref 135–145)
Total Bilirubin: 2.7 mg/dL — ABNORMAL HIGH (ref 0.3–1.2)
Total Protein: 5.8 g/dL — ABNORMAL LOW (ref 6.5–8.1)

## 2021-11-08 LAB — RENAL FUNCTION PANEL
Albumin: 2.1 g/dL — ABNORMAL LOW (ref 3.5–5.0)
Anion gap: 13 (ref 5–15)
BUN: 70 mg/dL — ABNORMAL HIGH (ref 8–23)
CO2: 20 mmol/L — ABNORMAL LOW (ref 22–32)
Calcium: 7.8 mg/dL — ABNORMAL LOW (ref 8.9–10.3)
Chloride: 103 mmol/L (ref 98–111)
Creatinine, Ser: 3.56 mg/dL — ABNORMAL HIGH (ref 0.44–1.00)
GFR, Estimated: 13 mL/min — ABNORMAL LOW (ref >60)
Glucose, Bld: 261 mg/dL — ABNORMAL HIGH (ref 70–99)
Phosphorus: 6.1 mg/dL — ABNORMAL HIGH (ref 2.5–4.6)
Potassium: 4.5 mmol/L (ref 3.5–5.1)
Sodium: 136 mmol/L (ref 135–145)

## 2021-11-08 LAB — BPAM PLATELET PHERESIS
Blood Product Expiration Date: 202301122359
Blood Product Expiration Date: 202301132359
Blood Product Expiration Date: 202301132359
Blood Product Expiration Date: 202301142359
ISSUE DATE / TIME: 202301110709
ISSUE DATE / TIME: 202301110939
ISSUE DATE / TIME: 202301111506
Unit Type and Rh: 600
Unit Type and Rh: 6200
Unit Type and Rh: 5100
Unit Type and Rh: 6200

## 2021-11-08 LAB — POCT I-STAT 7, (LYTES, BLD GAS, ICA,H+H)
Acid-base deficit: 1 mmol/L (ref 0.0–2.0)
Bicarbonate: 22.8 mmol/L (ref 20.0–28.0)
Calcium, Ion: 1.09 mmol/L — ABNORMAL LOW (ref 1.15–1.40)
HCT: 22 % — ABNORMAL LOW (ref 36.0–46.0)
Hemoglobin: 7.5 g/dL — ABNORMAL LOW (ref 12.0–15.0)
O2 Saturation: 96 %
Patient temperature: 99.8
Potassium: 4.1 mmol/L (ref 3.5–5.1)
Sodium: 141 mmol/L (ref 135–145)
TCO2: 24 mmol/L (ref 22–32)
pCO2 arterial: 32.5 mmHg (ref 32.0–48.0)
pH, Arterial: 7.457 — ABNORMAL HIGH (ref 7.350–7.450)
pO2, Arterial: 83 mmHg (ref 83.0–108.0)

## 2021-11-08 LAB — PREPARE PLATELET PHERESIS
Unit division: 0
Unit division: 0
Unit division: 0
Unit division: 0

## 2021-11-08 LAB — LACTATE DEHYDROGENASE: LDH: 1013 U/L — ABNORMAL HIGH (ref 98–192)

## 2021-11-08 LAB — RETICULOCYTES
Immature Retic Fract: 31.2 % — ABNORMAL HIGH (ref 2.3–15.9)
RBC.: 2.66 MIL/uL — ABNORMAL LOW (ref 3.87–5.11)
Retic Count, Absolute: 191.8 10*3/uL — ABNORMAL HIGH (ref 19.0–186.0)
Retic Ct Pct: 7.2 % — ABNORMAL HIGH (ref 0.4–3.1)

## 2021-11-08 LAB — MAGNESIUM: Magnesium: 2.6 mg/dL — ABNORMAL HIGH (ref 1.7–2.4)

## 2021-11-08 LAB — APTT
aPTT: 64 seconds — ABNORMAL HIGH (ref 24–36)
aPTT: 67 seconds — ABNORMAL HIGH (ref 24–36)

## 2021-11-08 LAB — PLATELET COUNT: Platelets: 5 10*3/uL — CL (ref 150–400)

## 2021-11-08 LAB — HEPATITIS B SURFACE ANTIGEN: Hepatitis B Surface Ag: NONREACTIVE

## 2021-11-08 LAB — HEPATITIS B SURFACE ANTIBODY,QUALITATIVE: Hep B S Ab: REACTIVE — AB

## 2021-11-08 MED ORDER — ORAL CARE MOUTH RINSE
15.0000 mL | Freq: Two times a day (BID) | OROMUCOSAL | Status: DC
  Administered 2021-11-08 – 2021-12-04 (×39): 15 mL via OROMUCOSAL

## 2021-11-08 MED ORDER — CHLORHEXIDINE GLUCONATE CLOTH 2 % EX PADS: 6.0000 | MEDICATED_PAD | Freq: Every day | CUTANEOUS | Status: DC

## 2021-11-08 MED ORDER — SODIUM CHLORIDE 0.9% IV SOLUTION: Freq: Once | INTRAVENOUS | Status: DC

## 2021-11-08 MED ORDER — LORAZEPAM 2 MG/ML IJ SOLN
0.5000 mg | Freq: Once | INTRAMUSCULAR | Status: DC
  Filled 2021-11-08: qty 1

## 2021-11-08 MED ORDER — HEPARIN SODIUM (PORCINE) 1000 UNIT/ML DIALYSIS
1000.0000 [IU] | INTRAMUSCULAR | Status: DC | PRN
  Administered 2021-11-08: 1000 [IU] via INTRAVENOUS_CENTRAL
  Filled 2021-11-08: qty 1

## 2021-11-08 MED ORDER — DEXTROSE 50 % IV SOLN: 0.0000 mL | INTRAVENOUS | Status: DC | PRN

## 2021-11-08 MED ORDER — SODIUM CHLORIDE 0.9 % IV SOLN: 100.0000 mL | INTRAVENOUS | Status: DC | PRN

## 2021-11-08 MED ORDER — ALBUMIN HUMAN 25 % IV SOLN
25.0000 g | Freq: Once | INTRAVENOUS | Status: AC
  Administered 2021-11-08: 25 g via INTRAVENOUS
  Filled 2021-11-08: qty 100

## 2021-11-08 MED ORDER — CHLORHEXIDINE GLUCONATE 0.12 % MT SOLN
15.0000 mL | Freq: Two times a day (BID) | OROMUCOSAL | Status: DC
  Administered 2021-11-08 – 2021-12-04 (×50): 15 mL via OROMUCOSAL
  Filled 2021-11-08 (×36): qty 15

## 2021-11-08 MED ORDER — LIDOCAINE HCL (PF) 1 % IJ SOLN: 5.0000 mL | INTRAMUSCULAR | Status: DC | PRN

## 2021-11-08 MED ORDER — INSULIN REGULAR(HUMAN) IN NACL 100-0.9 UT/100ML-% IV SOLN
INTRAVENOUS | Status: DC
  Administered 2021-11-08: 11.5 [IU]/h via INTRAVENOUS
  Administered 2021-11-09: 3.8 [IU]/h via INTRAVENOUS
  Administered 2021-11-10: 12 [IU]/h via INTRAVENOUS
  Filled 2021-11-08 (×3): qty 100

## 2021-11-08 MED ORDER — SODIUM CHLORIDE 0.9 % IV SOLN: INTRAVENOUS | Status: DC | PRN

## 2021-11-08 MED ORDER — TRACE MINERALS CU-MN-SE-ZN 300-55-60-3000 MCG/ML IV SOLN
INTRAVENOUS | Status: AC
  Filled 2021-11-08: qty 403.2

## 2021-11-08 MED ORDER — LIDOCAINE-PRILOCAINE 2.5-2.5 % EX CREA
1.0000 "application " | TOPICAL_CREAM | CUTANEOUS | Status: DC | PRN
  Filled 2021-11-08: qty 5

## 2021-11-08 MED ORDER — ALTEPLASE 2 MG IJ SOLR: 2.0000 mg | Freq: Once | INTRAMUSCULAR | Status: DC | PRN

## 2021-11-08 MED ORDER — PENTAFLUOROPROP-TETRAFLUOROETH EX AERO: 1.0000 "application " | INHALATION_SPRAY | CUTANEOUS | Status: DC | PRN

## 2021-11-08 NOTE — Progress Notes (Signed)
ANTICOAGULATION CONSULT NOTE   Pharmacy Consult for bivalirudin Indication: pulmonary embolus  Allergies  Allergen Reactions   Aspirin Nausea And Vomiting    Other reaction(s): Unknown   Benadryl [Diphenhydramine]     Can only take dye free. States the dye causes itching.   Darvon [Propoxyphene] Itching    Can only during the day. Night time makes her itch   Hydrocodone Bit-Homatrop Mbr Itching   Metformin     Other reaction(s): GI   Other     Other reaction(s): Unknown. States reaction was to nasal spray that caused pupils to shrink   Pentazocine     nervous Other reaction(s): jitters   Percocet [Oxycodone-Acetaminophen]     itching   Sulfa Antibiotics Hives   Tetracaine Hcl     Other reaction(s): Unknown. Thinks caused itching.   Tetracyclines & Related Hives   Tramadol     Other reaction(s): itch    Patient Measurements: Height: 5\' 5"  (165.1 cm) Weight: 77 kg (169 lb 12.1 oz) IBW/kg (Calculated) : 57  Vital Signs: Temp: 99.3 F (37.4 C) (01/12 1704) Temp Source: Axillary (01/12 1704) BP: 102/55 (01/12 1800) Pulse Rate: 94 (01/12 1800)  Labs: Recent Labs    11/06/21 0356 11/06/21 0617 11/06/21 1615 11/06/21 1746 11/07/21 0416 11/07/21 1334 11/07/21 1500 11/07/21 1523 11/07/21 2340 11/08/21 0420 11/08/21 1208 11/08/21 1335  HGB 8.8*  --   --   --  8.9* 8.7*  --   --   --  7.9*  --  7.5*  HCT 25.6*  --   --   --  26.9* 26.3*  --   --   --  23.4*  --  22.0*  PLT 18*   17*  --   --   --  5* 5* <5*  --   --  <5* 5*  --   APTT 55*  --   --   --   --   --  100*  --  67* 64*  --   --   LABPROT 16.2*  --   --   --   --   --  14.7  --   --   --   --   --   INR 1.3*  --   --   --   --   --  1.2  --   --   --   --   --   HEPARINUNFRC  --  <0.10*  --  0.33 0.39  --   --   --   --   --   --   --   CREATININE 2.19*  --    < >  --  1.93*  --   --  2.22*  --  3.65*   3.56*  --   --    < > = values in this interval not displayed.     Estimated Creatinine  Clearance: 14.7 mL/min (A) (by C-G formula based on SCr of 3.56 mg/dL (H)).   Assessment: 71 yo female with saddle PE - s/p thrombectomy and started on heparin - then stopped after intraperitoneal bleed.  Restarted at low dose.  1/11 Heme/onc consulted as platelets were 5 after 2 transfusions of platelets. HIT Ab and SRA both were negative. Heme suspected that HIT could still be an issue and bivalirudin will be used for anticoagulation instead of heparin. Avoiding argatroban with liver dysfunction.  1/12 pm Pt with reduced responsiveness so bivalirudin stopped and pt taken for head  CT. Head CT negative. Bivalirudin restarted ~1845.  Goal of Therapy:  PTT goal 50-85 sec Monitor platelets by anticoagulation protocol: Yes   Plan:  Bivalirudin 0.05mg /kg/hr F/u 4 hr aPTT after restart  Sherlon Handing, PharmD, BCPS Please see amion for complete clinical pharmacist phone list 11/08/2021 6:50 PM

## 2021-11-08 NOTE — Progress Notes (Signed)
PHARMACY - TOTAL PARENTERAL NUTRITION CONSULT NOTE   Indication:  Intolerance of enteral feeding and unable to obtain enteral access  Patient Measurements: Height: 5\' 5"  (165.1 cm) Weight: 76.1 kg (167 lb 12.3 oz) IBW/kg (Calculated) : 57   Body mass index is 27.92 kg/m.  Assessment: 71 years of age female who was admitted 10/28/21 after cardiac arrest and out of hospital CPR due to large saddle pulmonary embolism. Patient has been intubated and course complicated by upper GI bleeding with low Hgb 7.9 and platelets <5, acute hepatic and renal failure. Due to low platelets currently unable to place Cortrak for enteral access. Pharmacy consulted to initiate TPN.   Glucose / Insulin: CBGs 200's on Levemir 5 + SSI (22 units/24hrs) - starting Decadron 20 mg daily and switching to IV Insulin drip for glucose control.  Electrolytes: K 4.5- watch closely with AKI; Phos elevated 6.1 and Mg 2.6. CoCa 9.3.  Renal: SCr trending up at 3.56 (baseline ~1.1) - CRRT currently off. Attempting intermittent hemodialysis treatment today 1/12 (2K+ bath planned).  Hepatic: LFTs trending down (103/200), Tbili continues to trend up 2.7 - no jaundice noted on exam.  Intake / Output; MIVF: Net -1.95L. LBM 11/07/21. GI Imaging: none since start of TPN GI Surgeries / Procedures: none since start of TPN  Central access: Triple lumen CVC placed 10/28/21 (confirmed with RN dedicated lumen available) TPN start date: `11/08/21  Nutritional Goals: Goal TPN rate is 70 mL/hr (provides 121 g of protein and 202 kcals per day)  RD Assessment: Estimated Needs Total Energy Estimated Needs: 1950-2150 kcals Total Protein Estimated Needs: 100-125 g Total Fluid Estimated Needs: >/= 1.8 L  Current Nutrition:  NPO  Plan:  Start TPN at 35 mL/hr at 1800 Electrolytes in TPN: Na 11mEq/L, K 68mEq/L, Ca 2.76mEq/L, Mg 4mEq/L, and Phos 7mmol/L due to AKI. Cl:Ac 1:2 Add standard MVI (unable to receive oral vitamins) and trace elements to  TPN Remove chromium Continue Insulin drip as per primary team for glucose control at this time. Monitor TPN labs on Mon/Thurs, repeating labs tomorrow with new start TPN.  Monitor intermittent hemodialysis tolerance and electrolytes carefully.   Sloan Leiter, PharmD, BCPS, BCCCP Clinical Pharmacist Clinical phone 11/08/2021 until 3:30PM [phone] Please refer to [facility] for [place] numbers 11/08/2021,9:50 AM

## 2021-11-08 NOTE — Progress Notes (Addendum)
RN worried about reduced responsiveness. STAT head CT ordered and bival held. Finish HD soon. On exam she arouses, opens eyes to voice, admits to being sleepy, but tracks. She will move all extremities with prompting. Confirmed exam with RN.    Julian Hy, DO 11/08/21 4:53 PM [facility]   Head CT w/o evidence of bleeding. My exam is most consistent with fatigue in someone who is very frail from prolonged critical illness. Ok to resume bivalirudin. D/w RN.  Julian Hy, DO 11/08/21 6:11 PM [facility]

## 2021-11-08 NOTE — Progress Notes (Signed)
[facility] for heparin>Angiomax Indication: pulmonary embolus  Allergies  Allergen Reactions   Aspirin Nausea And Vomiting    Other reaction(s): Unknown   Benadryl [Diphenhydramine]     Can only take dye free. States the dye causes itching.   Darvon [Propoxyphene] Itching    Can only during the day. Night time makes her itch   Hydrocodone Bit-Homatrop Mbr Itching   Metformin     Other reaction(s): GI   Other     Other reaction(s): Unknown. States reaction was to nasal spray that caused pupils to shrink   Pentazocine     nervous Other reaction(s): jitters   Percocet [Oxycodone-Acetaminophen]     itching   Sulfa Antibiotics Hives   Tetracaine Hcl     Other reaction(s): Unknown. Thinks caused itching.   Tetracyclines & Related Hives   Tramadol     Other reaction(s): itch    Patient Measurements: Height: 5\' 5"  (165.1 cm) Weight: 79 kg (174 lb 2.6 oz) IBW/kg (Calculated) : 57  Vital Signs: Temp: 100.8 F (38.2 C) (01/11 2300) Temp Source: Axillary (01/11 2300) BP: 123/61 (01/12 0015) Pulse Rate: 103 (01/12 0015)  Labs: Recent Labs    11/05/21 0500 11/05/21 0836 11/06/21 0356 11/06/21 0617 11/06/21 1615 11/06/21 1746 11/07/21 0416 11/07/21 1334 11/07/21 1500 11/07/21 1523 11/07/21 2340  HGB  --    < > 8.8*  --   --   --  8.9* 8.7*  --   --   --   HCT  --    < > 25.6*  --   --   --  26.9* 26.3*  --   --   --   PLT 16*   < > 18*   17*  --   --   --  5* 5* <5*  --   --   APTT 46*  --  55*  --   --   --   --   --  100*  --  67*  LABPROT 16.0*  --  16.2*  --   --   --   --   --  14.7  --   --   INR 1.3*  --  1.3*  --   --   --   --   --  1.2  --   --   HEPARINUNFRC <0.10*   < >  --  <0.10*  --  0.33 0.39  --   --   --   --   CREATININE 2.32*   < > 2.19*  --  2.13*  --  1.93*  --   --  2.22*  --    < > = values in this interval not displayed.     Estimated Creatinine Clearance: 23.8 mL/min (A) (by C-G formula based on SCr  of 2.22 mg/dL (H)).   Assessment: 71 yo female with saddle PE - s/p thrombectomy and started on heparin - then stopped after intraperitoneal bleed.   Patient with low pltc 20s, D-Dimer back up >20, fibrinogen back up 570 and CRRT circuit clotting at least once per shift. Resume low dose heparin drip no bolus and up titrate slowly.  Heparin level now therapeutic (0.39) on gtt at 1150 units/hr. Hgb around 8.9 and plt count down to 5 this morning. She is HIT Ab negative and awaiting SRA results. She was given 2 transfusions of platelets this morning. Given she is therapeutic and being treated for PE. Will be conservative  on dose adjustments with low platelet count. No signs or symptoms of bleeding.  Heme/onc consulted as platelets were 5 after 2 transfusions of platelets. HIT Ab and SRA both were negative. Heme suspected that HIT could still be an issue and bivalirudin will be used for anticoagulation instead of heparin. She has poor renal function so we will start with the adjusted dose.   1/12 AM update:  aPTT therapeutic at 67  Goal of Therapy:  PTT goal 50-85 Monitor platelets by anticoagulation protocol: Yes   Plan:  Cont Bivalirudin 0.05mg /kg/hr Check 4 hr aPTT  Narda Bonds, PharmD, BCPS Clinical Pharmacist Phone: [phone]

## 2021-11-08 NOTE — Progress Notes (Addendum)
Per Dr. Marin Olp at bedside transfuse 1 out of 2 units of platelets at this time and repeat CBC 1 hour after. Mentioned the possibility for MRI today.

## 2021-11-08 NOTE — Progress Notes (Addendum)
Nutrition Follow-up  DOCUMENTATION CODES:   Severe malnutrition in context of acute illness/injury  INTERVENTION:   TPN order per Pharmacy  Monitor magnesium, potassium, and phosphorus BID for at least 3 days, MD to replete as needed, as pt is at high risk for refeeding syndrome given prolonged inadequate nutrition, malnutrition.  Recommend Cortrak placement as soon as platelets improve to acceptable level.    NUTRITION DIAGNOSIS:   Severe Malnutrition related to acute illness as evidenced by moderate muscle depletion, energy intake < or equal to 50% for > or equal to 5 days, mild fat depletion.  Being addressed via TPN  GOAL:   Patient will meet greater than or equal to 90% of their needs  Progressing  MONITOR:   Diet advancement, Labs, Weight trends, Other (Comment)  REASON FOR ASSESSMENT:   Ventilator    ASSESSMENT:   71 yo female admitted with acute encephalopathy post OOH prolonged V.fib arrest with multiorgan failure with massive saddle PE and DIC, AKI. PMH includes DM, HTN, HLD, GERD, anxiety, depression, fibromyalgia  1/01 Admitted, VF arrest, Intubated, Concern for anoxic injury 1/02 Saddle PE s/p Mechanical thrombectomy 1/03 Hemodynamics improved post thrombectomy, weaning pressors, Hgb table, bleeding reduced, started trickle TF. Later in day, pressor requirement increased with Hgb drop to around 5 and found to have large intraperitoneal/intraparenchymal hemorrhage extending into left subscapular hematoma. Pt received PRBC x 5, FFP x 5, Platelets x 1 1/05 acute hemorrhage from left hepatic lob s/p Left hepatic lobe embolization, TF resumed at trickle 1/06 Episode of emesis after TF increased to 65 ml/hr 1/09 TF held all weekend, resuming trickle TF today 1/10 Started to advanced TF pat 10 ml/hr but pt extubated later in the day, OG tube removed 1/11 Cortrak unable to be placed as platelets 5 post transfusion, failed bedside swallow, plan for TPN and  hematology consulted 1/12 Platelets <5, off CRRT  Platelets back up to 5 post transfusion Remains NPO-high aspiration risk per SLP plan for instrumental swallow when able TPN to start tonight  Noted plan for iHD today  Pt meets ASPEN/AND malnutrition criteria in the setting of acute illness give </= 50% of nutritional needs  5 days (NPO or trickle TF since admission until today) with moderate muscle wasting  Insulin gtt started back today; elevated CBGs overnight, started on decadron and starting TPN tonight  Labs:CBGS >200 overnight phosphorus 6.1 9H), potassium 4.5 (wdl), magnesium 2.6 Meds: decadron  NUTRITION - FOCUSED PHYSICAL EXAM:  Flowsheet Row Most Recent Value  Orbital Region Mild depletion  Upper Arm Region Unable to assess  Thoracic and Lumbar Region Unable to assess  Buccal Region Moderate depletion  Temple Region Moderate depletion  Clavicle Bone Region Mild depletion  Clavicle and Acromion Bone Region Mild depletion  Scapular Bone Region Mild depletion  Dorsal Hand No depletion  Patellar Region Unable to assess  Anterior Thigh Region Moderate depletion  Posterior Calf Region Unable to assess  Edema (RD Assessment) Moderate       Diet Order:   Diet Order             Diet NPO time specified  Diet effective now                   EDUCATION NEEDS:   Not appropriate for education at this time  Skin:  Skin Assessment: Reviewed RN Assessment  Last BM:  1/12  Height:   Ht Readings from Last 1 Encounters:  10/28/21 5\' 5"  (1.651 m)  Weight:   Wt Readings from Last 1 Encounters:  11/08/21 77 kg    BMI:  Body mass index is 28.25 kg/m.  Estimated Nutritional Needs:   Kcal:  1950-2150 kcals  Protein:  100-125 g  Fluid:  >/= 1.8 L   Kerman Passey MS, RDN, LDN, CNSC Registered Dietitian III Clinical Nutrition RD Pager and On-Call Pager Number Located in [place]

## 2021-11-08 NOTE — Progress Notes (Addendum)
Overnight, there is been no problems with [name].  She has had no bleeding.  Her labs this morning still show a platelet count less than 5000.  Her white cell count is 23.8.  Hemoglobin is 7.9.  Her BUN is 70 creatinine 3.56.  Her blood sugar is at 261 which is no surprise given her steroid use.  I did give her dose of nplate yesterday.  It may take a couple days for this to work if it does work.  She is not bleeding.  Her platelet count is still incredibly low.  Maybe, she just had a bag of platelets yesterday that were just not compatible.  We will try 1 unit of platelets and then check her platelet count an hour after her transfusion.    She has had no problems with blood pressure.  She is off pressors now.  She still has this lesion in the liver.  Again, an MRI could certainly help better identify this.  If there is any suggestion of a malignancy, at some point, this will need to be biopsied.  I do appreciate pharmacy's help with the bivalirudin infusion.  Despite the thrombocytopenia, I have to believe that she is hypercoagulable.  Whether or not this lesion the liver is a factor with hypercoagulability is certainly a possibility.  There is still quite a few issues here that are not settled.  I am still not sure as to why she did not respond to platelet transfusion yesterday.  I suppose she may have gotten some platelets that may have been "incompatible."  Again, when I looked at her blood smear, I did not see anything that looks like microangiopathic hemolysis.  I still see what her liver function studies are.  Her reticulocyte count is a little bit on the higher side.  Hopefully, we will see an improvement with her hematologic parameters in the next day or so.  I know this is incredibly complicated.  I know the staff in the cardiac ICU are doing an incredible job with her.  She is still an amazing person given all that she has been through and is still with this.  [name],  MD  Darlyn Chamber 17:14

## 2021-11-08 NOTE — Progress Notes (Signed)
Speech Language Pathology Treatment: Dysphagia  Patient Details Name: [name] MRN: [id number] DOB: [birth year] Today's Date: 11/08/2021 Time: 5053-9767 SLP Time Calculation (min) (ACUTE ONLY): 17 min  Assessment / Plan / Recommendation Clinical Impression  Pt vocal quality remains largely aphonic, with brief phonation achieved with cued "ah". Pt positioned partially reclined (due to femoral line) in bed for minimal trials of ice chips due to possible MRI later this date per RN. She continues to exhibit multiple swallows with single ice chips and suspect reduced hyolaryngeal excursion via palpation. Delayed weak throat clear noted x2 across x3 trials, which raises additional concern for poor airway protection. Silent aspiration risk remains high. Recommend continue NPO with few ice chips after thorough oral care. Educated family, present in room, regarding recommendations this date and probable necessity of instrumental swallow assessment when pt is ready. Will continue f/u.     HPI HPI: Pt is a 71 yo female presenting 1/1 after prolonged OOH VF arrest (20 min of CPR, also coded 2x in ED, 4 min and 3 min) secondary to massive PE complicated by DIC. She is s/p thrombectomy on 1/2. Hospital course further complicated by AKI requiring CRRT, hemorrhagic shock due to hepatic bleeding, vomiting wtih TF. PMH includes DM, HTN, HLD, GERD, anxiety, depression, fibromyalgia      SLP Plan  Continue with current plan of care      Recommendations for follow up therapy are one component of a multi-disciplinary discharge planning process, led by the attending physician.  Recommendations may be updated based on patient status, additional functional criteria and insurance authorization.    Recommendations  Diet recommendations: NPO;Other(comment) (Ice chips PRN after oral care) Medication Administration: Via alternative means                Oral Care Recommendations: Oral care QID;Oral care prior  to ice chip/H20 Follow Up Recommendations: Acute inpatient rehab (3hours/day) Assistance recommended at discharge: Intermittent Supervision/Assistance SLP Visit Diagnosis: Dysphagia, unspecified (R13.10) Plan: Continue with current plan of care          Ellwood Dense, [place], [place] Office Number: [place]  11/08/2021, 9:55 AM

## 2021-11-08 NOTE — Progress Notes (Signed)
eLink Physician-Brief Progress Note Patient Name: [name] DOB: September 09, 1951 MRN: [id number]   Date of Service  11/08/2021  HPI/Events of Note  Platelet count less than 5.  eICU Interventions  Transfusing 2 units of platelets     Intervention Category Intermediate Interventions: Thrombocytopenia - evaluation and management  Mauri Brooklyn, P 11/08/2021, 6:19 AM

## 2021-11-08 NOTE — Progress Notes (Signed)
[facility] for heparin>Angiomax Indication: pulmonary embolus  Allergies  Allergen Reactions   Aspirin Nausea And Vomiting    Other reaction(s): Unknown   Benadryl [Diphenhydramine]     Can only take dye free. States the dye causes itching.   Darvon [Propoxyphene] Itching    Can only during the day. Night time makes her itch   Hydrocodone Bit-Homatrop Mbr Itching   Metformin     Other reaction(s): GI   Other     Other reaction(s): Unknown. States reaction was to nasal spray that caused pupils to shrink   Pentazocine     nervous Other reaction(s): jitters   Percocet [Oxycodone-Acetaminophen]     itching   Sulfa Antibiotics Hives   Tetracaine Hcl     Other reaction(s): Unknown. Thinks caused itching.   Tetracyclines & Related Hives   Tramadol     Other reaction(s): itch    Patient Measurements: Height: 5\' 5"  (165.1 cm) Weight: 79 kg (174 lb 2.6 oz) IBW/kg (Calculated) : 57  Vital Signs: Temp: 99.7 F (37.6 C) (01/12 0415) Temp Source: Axillary (01/12 0415) BP: 118/57 (01/12 0515) Pulse Rate: 104 (01/12 0515)  Labs: Recent Labs    11/06/21 0356 11/06/21 0617 11/06/21 1615 11/06/21 1746 11/07/21 0416 11/07/21 1334 11/07/21 1500 11/07/21 1523 11/07/21 2340 11/08/21 0420  HGB 8.8*  --   --   --  8.9* 8.7*  --   --   --  7.9*  HCT 25.6*  --   --   --  26.9* 26.3*  --   --   --  23.4*  PLT 18*   17*  --   --   --  5* 5* <5*  --   --  <5*  APTT 55*  --   --   --   --   --  100*  --  67* 64*  LABPROT 16.2*  --   --   --   --   --  14.7  --   --   --   INR 1.3*  --   --   --   --   --  1.2  --   --   --   HEPARINUNFRC  --  <0.10*  --  0.33 0.39  --   --   --   --   --   CREATININE 2.19*  --  2.13*  --  1.93*  --   --  2.22*  --   --      Estimated Creatinine Clearance: 23.8 mL/min (A) (by C-G formula based on SCr of 2.22 mg/dL (H)).   Assessment: 71 yo female with saddle PE - s/p thrombectomy and started on heparin -  then stopped after intraperitoneal bleed.   Patient with low pltc 20s, D-Dimer back up >20, fibrinogen back up 570 and CRRT circuit clotting at least once per shift. Resume low dose heparin drip no bolus and up titrate slowly.  Heparin level now therapeutic (0.39) on gtt at 1150 units/hr. Hgb around 8.9 and plt count down to 5 this morning. She is HIT Ab negative and awaiting SRA results. She was given 2 transfusions of platelets this morning. Given she is therapeutic and being treated for PE. Will be conservative on dose adjustments with low platelet count. No signs or symptoms of bleeding.  Heme/onc consulted as platelets were 5 after 2 transfusions of platelets. HIT Ab and SRA both were negative. Heme suspected that HIT could still  be an issue and bivalirudin will be used for anticoagulation instead of heparin. She has poor renal function so we will start with the adjusted dose.   1/12 AM update:  aPTT therapeutic at 67  1/12 AM update #2:  aPTT remains therapeutic  Plts remain <5-hematology following now  Goal of Therapy:  PTT goal 50-85 Monitor platelets by anticoagulation protocol: Yes   Plan:  Cont Bivalirudin 0.05mg /kg/hr Would check q12h aPTT   Narda Bonds, PharmD, BCPS Clinical Pharmacist Phone: [phone]

## 2021-11-08 NOTE — Progress Notes (Signed)
Bival gtt turned off per Dr. Carlis Abbott.  Pt is less responsive, will be going for STAT head CT.

## 2021-11-08 NOTE — Progress Notes (Signed)
[facility] Progress Note     Assessment/ Plan:   Renal failure secondary to ATN in the setting of cardiac arrest, hypotension, pressors, contrast.  BL CKD3A with BL creatinine in the 1.17-1.3 range in late 2021. - R fem HD cath - CRRT 10/31/20 - changed to all 4K bath1/9/23-1/11/23 -still volume overloaded - off pressor- will do IHD treatment today   Prolonged VF cardiac arrest--> OOH VF arrest 2/2 massive PE Massive PE s/p mechanical thrombectomy 1/2 of R and L PA.  Vasc US- no DVT, liver mass noted 10/28/21 CT abd/ pelvis, getting MRI abd  Retroperitoneal bleed/Hemorrhagic shock/Acute blood loss anemia originating from left liver lobe s/p embolization 1/4--> required FFP, plts, Vit K, pRBCs Thrombocytopenia- component of DIC suspected, HIT negative, heparin --> bivalirudin, getting platelet transfusions, heme involved, suspects ITP, getting Nplate and dex DM HTN Acute hypoxic RF- extubated 11/07/21 Sepsis/ leukocytosis- on meropenem, WBC ct increasing to 25.1, cultures negative so far Dispo: ICU  Subjective:    Remains extubated.  Breathing labored this AM, laid flat for a little bit but sats dropped so sat up.  MRI planned for today/ tomorrow.      Objective:   BP (!) 139/58    Pulse 91    Temp 100.2 F (37.9 C)    Resp 16    Ht 5\' 5"  (1.651 m)    Wt 76.1 kg    SpO2 97%    BMI 27.92 kg/m   Intake/Output Summary (Last 24 hours) at 11/08/2021 4982 Last data filed at 11/08/2021 0600 Gross per 24 hour  Intake 1114.38 ml  Output 1859 ml  Net -744.62 ml   Weight change: -2.9 kg  Physical Exam: GEN: NAD, lying flat in bed HEENT:  sclerae anicteric NECK: Supple, no thyromegaly LUNGS: decreased air movement bilaterally, some crackles CV: Tachy ABD: soft, nontender EXT: 1+ LE edema, in heel protectors, UE edema nearly resolved ACCESS: L fem nontunneled HD cath   Imaging: VAS [name] LOWER EXTREMITY VENOUS (DVT)  Result Date: 11/06/2021  Lower Venous DVT Study  Patient Name:  [name]  Date of Exam:   11/06/2021 Medical Rec #: [id number]        Accession #:    [id number] Date of Birth: Apr 10, 1951        Patient Gender: F Patient Age:   71 years Exam Location:  [facility] Procedure:      VAS [name] LOWER EXTREMITY VENOUS (DVT) Referring Phys: Noemi Chapel --------------------------------------------------------------------------------  Indications: Pain.  Risk Factors: None identified. Limitations: Body habitus, poor ultrasound/tissue interface and patient positioning, patient immobility. Comparison Study: 11/02/2021 - Negative for DVT bilaterally. Performing Technologist: Oliver Hum RVT  Examination Guidelines: A complete evaluation includes B-mode imaging, spectral Doppler, color Doppler, and power Doppler as needed of all accessible portions of each vessel. Bilateral testing is considered an integral part of a complete examination. Limited examinations for reoccurring indications may be performed as noted. The reflux portion of the exam is performed with the patient in reverse Trendelenburg.  +---------+---------------+---------+-----------+----------+--------------+  RIGHT     Compressibility Phasicity Spontaneity Properties Thrombus Aging  +---------+---------------+---------+-----------+----------+--------------+  CFV       Full            Yes       Yes                                    +---------+---------------+---------+-----------+----------+--------------+  SFJ       Full                                                             +---------+---------------+---------+-----------+----------+--------------+  FV Prox   Full                                                             +---------+---------------+---------+-----------+----------+--------------+  FV Mid    Full                                                             +---------+---------------+---------+-----------+----------+--------------+  FV Distal                 Yes       Yes                                     +---------+---------------+---------+-----------+----------+--------------+  PFV       Full                                                             +---------+---------------+---------+-----------+----------+--------------+  POP       Full            Yes       Yes                                    +---------+---------------+---------+-----------+----------+--------------+  PTV       Full                                                             +---------+---------------+---------+-----------+----------+--------------+  PERO      Full                                                             +---------+---------------+---------+-----------+----------+--------------+   +----+---------------+---------+-----------+----------+--------------+  LEFT Compressibility Phasicity Spontaneity Properties Thrombus Aging  +----+---------------+---------+-----------+----------+--------------+  CFV  Full            Yes       Yes                                    +----+---------------+---------+-----------+----------+--------------+  Summary: RIGHT: - There is no evidence of deep vein thrombosis in the lower extremity. However, portions of this examination were limited- see technologist comments above.  - No cystic structure found in the popliteal fossa.  LEFT: - No evidence of common femoral vein obstruction.  *See table(s) above for measurements and observations. Electronically signed by Harold Barban MD on 11/06/2021 at 9:44:21 PM.    Final     Labs: BMET Recent Labs  Lab 11/05/21 0500 11/05/21 1631 11/06/21 0356 11/06/21 1615 11/07/21 0416 11/07/21 1523 11/08/21 0420  NA 137 136 136 134* 136 134* 140   136  K 3.4* 3.5 3.6 3.3* 3.8 4.0 4.5   4.5  CL 103 103 102 103 105 100 101   103  CO2 22 17* 19* 21* 21* 19* 21*   20*  GLUCOSE 151* 170* 236* 268* 143* 224* 254*   261*  BUN 37* 37* 40* 44* 40* 42* 69*   70*  CREATININE 2.32* 2.25* 2.19* 2.13* 1.93* 2.22* 3.65*   3.56*   CALCIUM 7.3* 7.3* 7.5* 7.5* 7.8* 7.9* 8.2*   7.8*  PHOS 2.6 2.4* 2.6 2.7 2.7 4.0 6.1*   CBC Recent Labs  Lab 11/06/21 0356 11/07/21 0416 11/07/21 1334 11/07/21 1500 11/08/21 0420  WBC 22.7* 25.1* 25.0*  --  23.8*  HGB 8.8* 8.9* 8.7*  --  7.9*  HCT 25.6* 26.9* 26.3*  --  23.4*  MCV 86.2 87.6 88.3  --  89.0  PLT 18*   17* 5* 5* <5* <5*    Medications:     sodium chloride   Intravenous Once   atorvastatin  20 mg Per Tube Daily   B-complex with vitamin C  1 tablet Per Tube Daily   Chlorhexidine Gluconate Cloth  6 each Topical Daily   Chlorhexidine Gluconate Cloth  6 each Topical Q0600   dexamethasone  20 mg Intravenous Q24H   feeding supplement (PROSource TF)  90 mL Per Tube 5 X Daily   folic acid  2 mg Oral Daily   gabapentin  100 mg Per Tube Q12H   hydrocortisone  25 mg Rectal BID   LORazepam  0.5 mg Intravenous Once   mouth rinse  15 mL Mouth Rinse BID   pantoprazole (PROTONIX) IV  40 mg Intravenous Q1400   sodium chloride flush  10-40 mL Intracatheter Q12H      Madelon Lips, MD 11/08/2021, 9:52 AM
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
## 2021-11-09 DIAGNOSIS — J9601 Acute respiratory failure with hypoxia: Secondary | ICD-10-CM | POA: Diagnosis not present

## 2021-11-09 DIAGNOSIS — I2602 Saddle embolus of pulmonary artery with acute cor pulmonale: Secondary | ICD-10-CM | POA: Diagnosis not present

## 2021-11-09 DIAGNOSIS — D65 Disseminated intravascular coagulation [defibrination syndrome]: Secondary | ICD-10-CM

## 2021-11-09 DIAGNOSIS — J9600 Acute respiratory failure, unspecified whether with hypoxia or hypercapnia: Secondary | ICD-10-CM

## 2021-11-09 DIAGNOSIS — J9690 Respiratory failure, unspecified, unspecified whether with hypoxia or hypercapnia: Secondary | ICD-10-CM

## 2021-11-09 LAB — CBC
HCT: 21.5 % — ABNORMAL LOW (ref 36.0–46.0)
HCT: 21.1 % — ABNORMAL LOW (ref 36.0–46.0)
Hemoglobin: 7.3 g/dL — ABNORMAL LOW (ref 12.0–15.0)
Hemoglobin: 7 g/dL — ABNORMAL LOW (ref 12.0–15.0)
MCH: 30 pg (ref 26.0–34.0)
MCH: 29.5 pg (ref 26.0–34.0)
MCHC: 34 g/dL (ref 30.0–36.0)
MCHC: 33.2 g/dL (ref 30.0–36.0)
MCV: 88.5 fL (ref 80.0–100.0)
MCV: 89 fL (ref 80.0–100.0)
Platelets: 8 10*3/uL — CL (ref 150–400)
Platelets: 9 10*3/uL — CL (ref 150–400)
RBC: 2.43 MIL/uL — ABNORMAL LOW (ref 3.87–5.11)
RBC: 2.37 MIL/uL — ABNORMAL LOW (ref 3.87–5.11)
RDW: 18.8 % — ABNORMAL HIGH (ref 11.5–15.5)
RDW: 18.7 % — ABNORMAL HIGH (ref 11.5–15.5)
WBC: 21.5 10*3/uL — ABNORMAL HIGH (ref 4.0–10.5)
WBC: 26.6 10*3/uL — ABNORMAL HIGH (ref 4.0–10.5)
nRBC: 0.3 % — ABNORMAL HIGH (ref 0.0–0.2)
nRBC: 0.2 % (ref 0.0–0.2)

## 2021-11-09 LAB — COMPREHENSIVE METABOLIC PANEL
ALT: 155 U/L — ABNORMAL HIGH (ref 0–44)
AST: 233 U/L — ABNORMAL HIGH (ref 15–41)
Albumin: 2.6 g/dL — ABNORMAL LOW (ref 3.5–5.0)
Alkaline Phosphatase: 229 U/L — ABNORMAL HIGH (ref 38–126)
Anion gap: 15 (ref 5–15)
BUN: 83 mg/dL — ABNORMAL HIGH (ref 8–23)
CO2: 21 mmol/L — ABNORMAL LOW (ref 22–32)
Calcium: 8.2 mg/dL — ABNORMAL LOW (ref 8.9–10.3)
Chloride: 102 mmol/L (ref 98–111)
Creatinine, Ser: 4.2 mg/dL — ABNORMAL HIGH (ref 0.44–1.00)
GFR, Estimated: 11 mL/min — ABNORMAL LOW (ref >60)
Glucose, Bld: 240 mg/dL — ABNORMAL HIGH (ref 70–99)
Potassium: 3.9 mmol/L (ref 3.5–5.1)
Sodium: 138 mmol/L (ref 135–145)
Total Bilirubin: 2.1 mg/dL — ABNORMAL HIGH (ref 0.3–1.2)
Total Protein: 6.1 g/dL — ABNORMAL LOW (ref 6.5–8.1)

## 2021-11-09 LAB — CULTURE, BLOOD (ROUTINE X 2)
Culture: NO GROWTH
Culture: NO GROWTH

## 2021-11-09 LAB — PREPARE PLATELET PHERESIS: Unit division: 0

## 2021-11-09 LAB — CARDIOLIPIN ANTIBODIES, IGG, IGM, IGA
Anticardiolipin IgA: <9 APL U/mL (ref 0–11)
Anticardiolipin IgG: <9 GPL U/mL (ref 0–14)
Anticardiolipin IgM: <9 MPL U/mL (ref 0–12)

## 2021-11-09 LAB — BPAM PLATELET PHERESIS
Blood Product Expiration Date: 202301132359
ISSUE DATE / TIME: 202301120701
Unit Type and Rh: 5100

## 2021-11-09 LAB — LACTATE DEHYDROGENASE: LDH: 1239 U/L — ABNORMAL HIGH (ref 98–192)

## 2021-11-09 LAB — GLUCOSE, CAPILLARY
Glucose-Capillary: 150 mg/dL — ABNORMAL HIGH (ref 70–99)
Glucose-Capillary: 154 mg/dL — ABNORMAL HIGH (ref 70–99)
Glucose-Capillary: 163 mg/dL — ABNORMAL HIGH (ref 70–99)
Glucose-Capillary: 165 mg/dL — ABNORMAL HIGH (ref 70–99)
Glucose-Capillary: 169 mg/dL — ABNORMAL HIGH (ref 70–99)
Glucose-Capillary: 173 mg/dL — ABNORMAL HIGH (ref 70–99)
Glucose-Capillary: 174 mg/dL — ABNORMAL HIGH (ref 70–99)
Glucose-Capillary: 181 mg/dL — ABNORMAL HIGH (ref 70–99)
Glucose-Capillary: 192 mg/dL — ABNORMAL HIGH (ref 70–99)
Glucose-Capillary: 207 mg/dL — ABNORMAL HIGH (ref 70–99)
Glucose-Capillary: 230 mg/dL — ABNORMAL HIGH (ref 70–99)
Glucose-Capillary: 233 mg/dL — ABNORMAL HIGH (ref 70–99)
Glucose-Capillary: 233 mg/dL — ABNORMAL HIGH (ref 70–99)
Glucose-Capillary: 245 mg/dL — ABNORMAL HIGH (ref 70–99)
Glucose-Capillary: 246 mg/dL — ABNORMAL HIGH (ref 70–99)
Glucose-Capillary: 293 mg/dL — ABNORMAL HIGH (ref 70–99)
Glucose-Capillary: 302 mg/dL — ABNORMAL HIGH (ref 70–99)

## 2021-11-09 LAB — TRIGLYCERIDES: Triglycerides: 144 mg/dL (ref <150)

## 2021-11-09 LAB — APTT
aPTT: 56 seconds — ABNORMAL HIGH (ref 24–36)
aPTT: 59 seconds — ABNORMAL HIGH (ref 24–36)
aPTT: 54 seconds — ABNORMAL HIGH (ref 24–36)

## 2021-11-09 LAB — HEPATITIS B SURFACE ANTIBODY, QUANTITATIVE: Hep B S AB Quant (Post): 37.4 m[IU]/mL (ref >9.9)

## 2021-11-09 LAB — PREPARE RBC (CROSSMATCH)

## 2021-11-09 LAB — PHOSPHORUS: Phosphorus: 5.1 mg/dL — ABNORMAL HIGH (ref 2.5–4.6)

## 2021-11-09 LAB — MAGNESIUM: Magnesium: 2.2 mg/dL (ref 1.7–2.4)

## 2021-11-09 MED ORDER — TRACE MINERALS CU-MN-SE-ZN 300-55-60-3000 MCG/ML IV SOLN
INTRAVENOUS | Status: DC
  Filled 2021-11-09: qty 806.4

## 2021-11-09 MED ORDER — SODIUM CHLORIDE 0.9% IV SOLUTION: Freq: Once | INTRAVENOUS | Status: DC

## 2021-11-09 MED ORDER — SODIUM CHLORIDE 0.9% IV SOLUTION: Freq: Once | INTRAVENOUS | Status: AC

## 2021-11-09 MED ORDER — TRACE MINERALS CU-MN-SE-ZN 300-55-60-3000 MCG/ML IV SOLN
INTRAVENOUS | Status: AC
  Filled 2021-11-09: qty 806.4

## 2021-11-09 MED ORDER — MIDODRINE HCL 5 MG PO TABS: 10.0000 mg | ORAL_TABLET | ORAL | Status: DC

## 2021-11-09 NOTE — TOC Progression Note (Signed)
Transition of Care [facility]) - Progression Note    Patient Details  Name: [name] MRN: [id number] Date of Birth: 1951/10/07  Transition of Care [facility]) CM/SW Contact  Graves-Bigelow, Ocie Cornfield, RN Phone Number: 11/09/2021, 11:45 AM  Clinical Narrative: Case Manager had an opportunity to speak with the patients daughter regarding disposition needs. Prior to arrival patient was from home alone in an apartment. Daughter states that she lives five minutes away from the patient and she checks in on the patient often. Daughter states that the patient has a cane at home; however, was not using it prior to hospitalization. Daughter states that if the patient cannot return home, patient can come to the daughters home which she states she has a bedroom on the main floor that her mother can use as well. Case Manager spoke with the provider and PT/OT has been ordered today. Patient has a temporary femoral cath present and will not be able to ambulate while the catheter is in place. Daughter had concerns regarding the loss of the patients top dentures in the ED. Case Manager did speak with the Staff RN, DD, and AD regarding dentures lost. Staff to check to see if a safety zone has been completed. Case Manager did speak with the provider regarding the daughter asking for a dentist to come in to complete an impression of the gums. Case Manager did relay to the daughter that the Dentist on Staff for the hospital do not assist in dental impressions.  Case Manager will continue to follow for additional transition of care needs.     Expected Discharge Plan: [place] Barriers to Discharge: Continued Medical Work up  Expected Discharge Plan and Services Expected Discharge Plan: [place] In-house Referral: Clinical Social Work Discharge Planning Services: CM Consult Post Acute Care Choice: [place] arrangements for the past 2 months: Apartment      Readmission  Risk Interventions Readmission Risk Prevention Plan 11/02/2021  Transportation Screening Complete  Some recent data might be hidden

## 2021-11-09 NOTE — Progress Notes (Signed)
PHARMACY - TOTAL PARENTERAL NUTRITION CONSULT NOTE  ° °Indication:  Intolerance of enteral feeding and unable to obtain enteral access ° °Patient Measurements: °Height: 5' 5" (165.1 cm) °Weight: 77.2 kg (170 lb 3.1 oz) °IBW/kg (Calculated) : 57 °  °Body mass index is 28.32 kg/m². ° °Assessment: 70 years of age female who was admitted 10/28/21 after cardiac arrest and out of hospital CPR due to large saddle pulmonary embolism. Patient has been intubated and course complicated by upper GI bleeding with low Hgb 7.9 and significantly low platelets, acute hepatic and renal failure. Due to low platelets currently unable to place Cortrak for enteral access. Pharmacy consulted to initiate TPN.  ° °Glucose / Insulin: CBGs 181-245 on Insulin drip - on Decadron 20 mg daily and TPN   °Electrolytes: K down 3.9- watch closely with AKI; Phos elevated-trend down at 5.1 and Mg 2.2. Corrected Ca stable 9.3.  °Renal: SCr trending up at 4.2 (baseline ~1.1) - CRRT currently off. Status post intermittent hemodialysis 1/12 (2K+ bath) - 3hrs at 350.  °Hepatic: AST up to 233 and Alk Phos up 229 after start of TPN, ALT down to 155 and Tbili down to 2.1 - no jaundice noted on exam. Triglycerides 144 - within normal limits.  °Intake / Output; MIVF: Net -1.95L. LBM 11/08/21. °GI Imaging: none since start of TPN °GI Surgeries / Procedures: none since start of TPN ° °Central access: Triple lumen CVC placed 10/28/21 (confirmed with RN dedicated lumen available; confirmed with CCM ok to use line for TPN despite age of line; unable to place new line due to AKI and platelets) °TPN start date: 11/08/21 ° °Nutritional Goals: °Goal concentrated TPN rate is 70 mL/hr (provides 121 g of protein and 1952 kcals per day) ° °RD Assessment: °Estimated Needs °Total Energy Estimated Needs: 1950-2150 kcals °Total Protein Estimated Needs: 100-125 g °Total Fluid Estimated Needs: >/= 1.8 L ° °Current Nutrition:  °NPO ° °Plan:  °Increase concentrated TPN to goal rate 70  mL/hr at 1800 - plan discussed with CCM team.  °Electrolytes in TPN: Na 50mEq/L, K 0mEq/L, Ca 2.5mEq/L, Mg 0mEq/L, and Phos 0mmol/L due to AKI. Cl:Ac 1:2 °Add standard MVI (unable to receive oral vitamins) and trace elements to TPN °Remove chromium with HD needs °Continue Insulin drip as per primary team for glucose control at this time. No insulin added to TPN.  °Monitor TPN labs on Mon/Thurs, repeating labs tomorrow with new start TPN.  °Monitor intermittent hemodialysis tolerance and electrolytes carefully - may need to add some electrolytes back to TPN at some point.  °Monitor platelets and ability to have tube placed for enteral feeding.  °Watch LFTs and Triglycerides as titrate up TPN ° ° , PharmD, BCPS, BCCCP °Clinical Pharmacist °Clinical phone 11/09/2021 until 3:30PM - #[phone] °Please refer to AMION for [facility] Pharmacy numbers °11/09/2021,7:37 AM ° °

## 2021-11-09 NOTE — Progress Notes (Signed)
[facility] Progress Note     Assessment/ Plan:   Renal failure secondary to ATN in the setting of cardiac arrest, hypotension, pressors, contrast.  BL CKD3A with BL creatinine in the 1.17-1.3 range in late 2021. - R fem HD cath - CRRT 10/31/20 - changed to all 4K bath1/9/23-1/11/23 -HD 11/08/21--> had to have pressor with it - next HD treatment 11/10/20--> increase UF goal d/t obligate intake and add midodrine on HD days to augment BP   Prolonged VF cardiac arrest--> OOH VF arrest 2/2 massive PE Massive PE s/p mechanical thrombectomy 1/2 of R and L PA.  Vasc US- no DVT, liver mass noted 10/28/21 CT abd/ pelvis, getting MRI abd  Retroperitoneal bleed/Hemorrhagic shock/Acute blood loss anemia originating from left liver lobe s/p embolization 1/4--> required FFP, plts, Vit K, pRBCs Thrombocytopenia- component of DIC suspected, HIT negative, heparin --> bivalirudin, getting platelet transfusions, heme involved, suspects ITP, getting Nplate and dex DM HTN Acute hypoxic RF- extubated 11/07/21 Sepsis/ leukocytosis- on meropenem, WBC ct increasing to 25.1, cultures negative so far--> MRI demonstrated aspiration pneumonia Dispo: ICU  Subjective:    HD yesterday with 2L off, had to go back on pressor intermittently while on it.  MRI yesterday unable to characterize liver mass without IV gad.  Did demonstrate aspiration pneumonia however.    Now on TPN, getting another plt transfusion today   Objective:   BP 117/65    Pulse 93    Temp 98.6 F (37 C) (Oral)    Resp 15    Ht 5\' 5"  (1.651 m)    Wt 77.2 kg    SpO2 97%    BMI 28.32 kg/m   Intake/Output Summary (Last 24 hours) at 11/09/2021 1006 Last data filed at 11/09/2021 0800 Gross per 24 hour  Intake 951.51 ml  Output 2000 ml  Net -1048.49 ml   Weight change: 2.9 kg  Physical Exam: GEN: NAD, sitting up in bed HEENT:  sclerae anicteric NECK: Supple, no thyromegaly LUNGS: decreased air movement bilaterally, some crackles CV:  Tachy ABD: soft, nontender EXT: 1+ LE edema, in heel protectors, UE edema nearly resolved ACCESS: L fem nontunneled HD cath   Imaging: CT HEAD WO CONTRAST (5MM)  Result Date: 11/08/2021 CLINICAL DATA:  Delirium. EXAM: CT HEAD WITHOUT CONTRAST TECHNIQUE: Contiguous axial images were obtained from the base of the skull through the vertex without intravenous contrast. RADIATION DOSE REDUCTION: This exam was performed according to the departmental dose-optimization program which includes automated exposure control, adjustment of the mA and/or kV according to patient size and/or use of iterative reconstruction technique. COMPARISON:  Head CT 11/04/2021 FINDINGS: Brain: Mild motion artifact limitations. No intracranial hemorrhage, mass effect, or midline shift. No hydrocephalus. The basilar cisterns are patent. For with differentiation is preserved. No evidence of territorial infarct or acute ischemia. No extra-axial or intracranial fluid collection. Vascular: Atherosclerosis of skullbase vasculature without hyperdense vessel or abnormal calcification. Skull: No fracture or focal lesion. Sinuses/Orbits: Paranasal sinuses and mastoid air cells are clear. The visualized orbits are unremarkable. Other: None. IMPRESSION: No acute intracranial abnormality, allowing for motion artifact limitations. Electronically Signed   By: Keith Rake M.D.   On: 11/08/2021 17:59   MR LIVER WO CONRTAST  Result Date: 11/09/2021 CLINICAL DATA:  71 year old female with history of intraperitoneal hemorrhage. Additional intraparenchymal hemorrhage noted within the liver. Evaluate for underlying hepatocellular carcinoma or other hepatic mass. EXAM: MRI ABDOMEN WITHOUT CONTRAST TECHNIQUE: Multiplanar multisequence MR imaging was performed without the administration of intravenous  contrast. COMPARISON:  No prior abdominal MRI. CT of the abdomen and pelvis 10/31/2011 scratch the CT the abdomen and pelvis 10/30/2021. FINDINGS:  Comment: Today's study is limited for detection and characterization of visceral and/or vascular lesions by lack of IV gadolinium. Additionally, extensive patient motion severely limits today's examination. Lower chest: High signal intensity in the lower thorax dependently, likely a combination of layering pleural effusions and extensive airspace consolidation in the dependent portions of the lungs, poorly evaluated by MRI. Allowing for differences between modalities, findings appear very similar to prior CT of the abdomen and pelvis 10/30/2021. Hepatobiliary: In the left lobe of the liver centered predominantly in segment 3 there is a expansile area within the parenchyma which is very heterogeneous in signal intensity on T1 and T2 weighted images, including some dependent areas of T1 hyperintensity. The remainder of the liver is otherwise normal in appearance, without additional suspicious cystic or solid hepatic lesions. No intra or extrahepatic biliary ductal dilatation. Gallbladder is moderately distended, also demonstrating heterogeneous signal intensity. Gallbladder wall appears mildly irregular, and there is some fluid adjacent to the gallbladder posteriorly, but no circumferential pericholecystic fluid to clearly indicate an acute cholecystitis at this time. Pancreas: No definite pancreatic mass or peripancreatic fluid collections or inflammatory changes noted on today's noncontrast examination. Spleen:  Unremarkable. Adrenals/Urinary Tract: Unenhanced appearance of the kidneys and bilateral adrenal glands is normal. No hydroureteronephrosis in the visualized portions of the abdomen. Stomach/Bowel: Visualized portions are unremarkable. Vascular/Lymphatic: No aneurysm identified in the visualized abdominal vasculature. No lymphadenopathy noted in the abdomen. Other: Moderate volume of free intraperitoneal fluid, best visualized in the pericolic gutters (right greater than left). This is slightly T1  hyperintense and T2 iso to slightly hyperintense, compatible with indwelling blood products. Musculoskeletal: No aggressive appearing osseous lesions are noted in the visualized portions of the skeleton. IMPRESSION: 1. Findings appear very similar to the recent CT examination. Unfortunately, without IV gadolinium, the large intraparenchymal hepatic lesion centered predominantly in segment 3 of the liver cannot be characterized. This is once again favored to represent a large intrahepatic hemorrhage, although underlying mass lesion is difficult to exclude. If there is elevated AFP level or other clinical concern for hepatic malignancy, repeat abdominal MRI with and without IV gadolinium would be recommended, preferably after resolution of the patient's acute illness to allow for proper patient breath-holding, to better evaluate these findings and exclude underlying malignancy. 2. Distended gallbladder which demonstrates heterogeneous signal intensity, likely in part related to vicarious excretion of iodinated contrast material, but also potentially related to the presence of blood products. Gallbladder is slightly more distended than prior CT examination. If there is clinical concern for cholecystitis, further evaluation with nuclear medicine hepatobiliary scan should be considered. 3. Intraperitoneal hemorrhage redemonstrated. 4. Severe bilateral lower lobe pneumonia and trace parapneumonic pleural effusions. Given the distribution, this likely represent severe aspiration pneumonia. Electronically Signed   By: Vinnie Langton M.D.   On: 11/09/2021 05:33    Labs: BMET Recent Labs  Lab 11/05/21 1631 11/06/21 0356 11/06/21 1615 11/07/21 0416 11/07/21 1523 11/08/21 0420 11/08/21 1335 11/09/21 0458  NA 136 136 134* 136 134* 140   136 141 138  K 3.5 3.6 3.3* 3.8 4.0 4.5   4.5 4.1 3.9  CL 103 102 103 105 100 101   103  --  102  CO2 17* 19* 21* 21* 19* 21*   20*  --  21*  GLUCOSE 170* 236* 268* 143* 224*  254*   261*  --  240*  BUN 37* 40* 44* 40* 42* 69*   70*  --  83*  CREATININE 2.25* 2.19* 2.13* 1.93* 2.22* 3.65*   3.56*  --  4.20*  CALCIUM 7.3* 7.5* 7.5* 7.8* 7.9* 8.2*   7.8*  --  8.2*  PHOS 2.4* 2.6 2.7 2.7 4.0 6.1*  --  5.1*   CBC Recent Labs  Lab 11/07/21 0416 11/07/21 1334 11/07/21 1500 11/08/21 0420 11/08/21 1208 11/08/21 1335 11/09/21 0458  WBC 25.1* 25.0*  --  23.8*  --   --  21.5*  HGB 8.9* 8.7*  --  7.9*  --  7.5* 7.3*  HCT 26.9* 26.3*  --  23.4*  --  22.0* 21.5*  MCV 87.6 88.3  --  89.0  --   --  88.5  PLT 5* 5* <5* <5* 5*  --  8*    Medications:     sodium chloride   Intravenous Once   chlorhexidine  15 mL Mouth Rinse BID   Chlorhexidine Gluconate Cloth  6 each Topical Daily   dexamethasone  20 mg Intravenous Q24H   mouth rinse  15 mL Mouth Rinse BID   mouth rinse  15 mL Mouth Rinse q12n4p   [START ON 11/10/2021] midodrine  10 mg Oral Q T,Th,Sa-HD   pantoprazole (PROTONIX) IV  40 mg Intravenous Q1400   sodium chloride flush  10-40 mL Intracatheter Q12H      Madelon Lips, MD 11/09/2021, 10:06 AM

## 2021-11-09 NOTE — Progress Notes (Signed)
ANTICOAGULATION CONSULT NOTE   Pharmacy Consult for bivalirudin Indication: pulmonary embolus  Allergies  Allergen Reactions   Aspirin Nausea And Vomiting    Other reaction(s): Unknown   Benadryl [Diphenhydramine]     Can only take dye free. States the dye causes itching.   Darvon [Propoxyphene] Itching    Can only during the day. Night time makes her itch   Hydrocodone Bit-Homatrop Mbr Itching   Metformin     Other reaction(s): GI   Other     Other reaction(s): Unknown. States reaction was to nasal spray that caused pupils to shrink   Pentazocine     nervous Other reaction(s): jitters   Percocet [Oxycodone-Acetaminophen]     itching   Sulfa Antibiotics Hives   Tetracaine Hcl     Other reaction(s): Unknown. Thinks caused itching.   Tetracyclines & Related Hives   Tramadol     Other reaction(s): itch    Patient Measurements: Height: 5\' 5"  (165.1 cm) Weight: 77.2 kg (170 lb 3.1 oz) IBW/kg (Calculated) : 57  Vital Signs: Temp: 98.7 F (37.1 C) (01/13 0920) Temp Source: Oral (01/13 0920) BP: 135/77 (01/13 0920) Pulse Rate: 98 (01/13 0920)  Labs: Recent Labs    11/06/21 1746 11/07/21 0416 11/07/21 1334 11/07/21 1500 11/07/21 1523 11/07/21 2340 11/08/21 0420 11/08/21 1208 11/08/21 1335 11/09/21 0016 11/09/21 0458  HGB  --  8.9*   < >  --   --   --  7.9*  --  7.5*  --  7.3*  HCT  --  26.9*   < >  --   --   --  23.4*  --  22.0*  --  21.5*  PLT  --  5*   < > <5*  --   --  <5* 5*  --   --  8*  APTT  --   --   --  100*  --    < > 64*  --   --  56* 59*  LABPROT  --   --   --  14.7  --   --   --   --   --   --   --   INR  --   --   --  1.2  --   --   --   --   --   --   --   HEPARINUNFRC 0.33 0.39  --   --   --   --   --   --   --   --   --   CREATININE  --  1.93*  --   --  2.22*  --  3.65*   3.56*  --   --   --  4.20*   < > = values in this interval not displayed.     Estimated Creatinine Clearance: 12.4 mL/min (A) (by C-G formula based on SCr of 4.2 mg/dL  (H)).   Assessment: 71 yo female with saddle PE - s/p thrombectomy and started on heparin - then stopped after intraperitoneal bleed.  Restarted at low dose.  Heme/onc consulted as platelets were 5 after 2 transfusions of platelets. HIT Ab and SRA both were negative. Heme suspected that HIT could still be an issue and bivalirudin will be used for anticoagulation instead of heparin. Avoiding argatroban with liver dysfunction.  1/12 pm Pt with reduced responsiveness so bivalirudin stopped and pt taken for head CT. Head CT negative. Bivalirudin restarted ~1845.  1/13 am No signs or symptoms of  bleeding. aPTT was therapeutic at 59. CBC stable and platelets increased. Had HD yesterday and serum creatinine increased. Will require close monitoring with high risk of bleed, on anticoagulation, and decreased renal function.  Goal of Therapy:  aPTT goal 50-85 sec Monitor platelets by anticoagulation protocol: Yes   Plan:  Bivalirudin 0.05mg /kg/hr F/u 12hr aPTT d/t renal function and high risk of bleeding   Varney Daily, PharmD PGY1 Pharmacy Resident  Please check AMION for all [facility] pharmacy phone numbers After 10:00 PM call main pharmacy [phone]

## 2021-11-09 NOTE — Progress Notes (Signed)
Speech Language Pathology Treatment: Dysphagia  Patient Details Name: [name] MRN: [id number] DOB: July 28, 1951 Today's Date: 11/09/2021 Time: 2353-6144 SLP Time Calculation (min) (ACUTE ONLY): 26 min  Assessment / Plan / Recommendation Clinical Impression  Pt is drowsier this morning, needing more cues to follow commands compared to initial evaluation. Her tongue also looks drier and more coated. SLP provided oral care, but she would benefit from ongoing, thorough oral care especially as she remains NPO. Given her lethargy she also needs increased cueing for opening her mouth to accept boluses and to try to cough on command. Although no overt coughing was noted, s/s of dysphagia and aphonia persist. Discussed with pt, daughter, and nursing: highly recommend instrumental swallow study given concern for dysphagia and silent aspiration. Do not think she is ready for this at the moment, but will f/u to determine when she may be appropriate. In addition to her LOA improving, will also need to clarify when she may medically be ready given her low platelets, as a FEES is more invasive, and she would have to sit upright in a chair for MBS but still has femoral line. Would continue with alternative means of nutrition (now getting TPN) through the weekend, allowing very small amounts of ice chip or water via clean swab after oral care is performed. Will f/u early next week to see if swallow study could be performed at that point.    HPI HPI: Pt is a 71 yo female presenting 1/1 after prolonged OOH VF arrest (20 min of CPR, also coded 2x in ED, 4 min and 3 min) secondary to massive PE complicated by DIC. She is s/p thrombectomy on 1/2. Hospital course further complicated by AKI requiring CRRT, hemorrhagic shock due to hepatic bleeding, vomiting wtih TF. PMH includes DM, HTN, HLD, GERD, anxiety, depression, fibromyalgia      SLP Plan  Continue with current plan of care      Recommendations for follow up  therapy are one component of a multi-disciplinary discharge planning process, led by the attending physician.  Recommendations may be updated based on patient status, additional functional criteria and insurance authorization.    Recommendations  Diet recommendations: NPO (Ice chips PRN after oral care) Medication Administration: Via alternative means                Oral Care Recommendations: Oral care QID;Oral care prior to ice chip/H20 Follow Up Recommendations: Acute inpatient rehab (3hours/day) Assistance recommended at discharge: Intermittent Supervision/Assistance SLP Visit Diagnosis: Dysphagia, unspecified (R13.10) Plan: Continue with current plan of care           Osie Bond., M.A. [facility] Pager [phone] Office [phone]  11/09/2021, 11:27 AM

## 2021-11-09 NOTE — Progress Notes (Addendum)
Hopefully, we are starting  to see an improvement with respect to her hematologic parameters.  Her platelet count this morning is 8000.  She did get 1 unit of platelets yesterday although she did not have a bump with her platelet count when checked posttransfusion.  I have to believe that there is some element of immune thrombocytopenia in this situation.  Rarely do you see platelet counts this low unless it is an immune based thrombocytopenia.  Hopefully, the Nplate and the steroids that she is currently on are helping.  I would not change these for right now.  I thought that if her platelet count was not better, that we could add IVIG.  She did get some dialysis yesterday.  Her metabolic panel is not back yet.  Her liver function tests yesterday seem to be a little better than what they had been previously.  She is on TNA.  I think speech pathology is checking her out for swallowing and any aspiration.  She is still on the bivalirudin.  Again, despite the profound thrombocytopenia, I still think that she has an element of hypercoagulability.  Unfortunately, the MRI of the liver really did not give [name] what I were thought it would.  They cannot tell us if there is an actual tumor in the liver.  They do not give any IV contrast because of her renal failure.  As such, we are going to have to get another MRI down the road when her renal function is better.  Hopefully, we will see the platelets improving over time.  When I feel that her platelet count is high enough and that there is not could be any invasive procedures, then we can certainly move her over to something oral.  Her hemoglobin is 7.3.  This is been slowly dropping.  I am sure this is to some degree from her past bleed.  She is on folic acid.  I would not give her ESA because of her pulmonary embolism.  If she needs to be transfused, I will leave this up to the CCU staff.  We still have a long way to go with her.  She has made  incredible progress.  Again, hopefully we will see the platelets continue to trend upward slowly.  I know that the staff in the cardiac ICU are doing an incredible job with her.    Raford Pitcher, [place] 121:7

## 2021-11-09 NOTE — Progress Notes (Signed)
[place] for bivalirudin Indication: pulmonary embolus Brief A/P: aPTT within goal range  Continue bivalirudin at current rate   Allergies  Allergen Reactions   Aspirin Nausea And Vomiting    Other reaction(s): Unknown   Benadryl [Diphenhydramine]     Can only take dye free. States the dye causes itching.   Darvon [Propoxyphene] Itching    Can only during the day. Night time makes her itch   Hydrocodone Bit-Homatrop Mbr Itching   Metformin     Other reaction(s): GI   Other     Other reaction(s): Unknown. States reaction was to nasal spray that caused pupils to shrink   Pentazocine     nervous Other reaction(s): jitters   Percocet [Oxycodone-Acetaminophen]     itching   Sulfa Antibiotics Hives   Tetracaine Hcl     Other reaction(s): Unknown. Thinks caused itching.   Tetracyclines & Related Hives   Tramadol     Other reaction(s): itch    Patient Measurements: Height: 5\' 5"  (165.1 cm) Weight: 77 kg (169 lb 12.1 oz) IBW/kg (Calculated) : 57  Vital Signs: Temp: 100.4 F (38 C) (01/13 0000) Temp Source: Axillary (01/13 0000) BP: 108/56 (01/13 0000) Pulse Rate: 90 (01/13 0000)  Labs: Recent Labs    11/06/21 0356 11/06/21 0617 11/06/21 1615 11/06/21 1746 11/07/21 0416 11/07/21 1334 11/07/21 1500 11/07/21 1523 11/07/21 2340 11/08/21 0420 11/08/21 1208 11/08/21 1335 11/09/21 0016  HGB 8.8*  --   --   --  8.9* 8.7*  --   --   --  7.9*  --  7.5*  --   HCT 25.6*  --   --   --  26.9* 26.3*  --   --   --  23.4*  --  22.0*  --   PLT 18*   17*  --   --   --  5* 5* <5*  --   --  <5* 5*  --   --   APTT 55*  --   --   --   --   --  100*  --  67* 64*  --   --  56*  LABPROT 16.2*  --   --   --   --   --  14.7  --   --   --   --   --   --   INR 1.3*  --   --   --   --   --  1.2  --   --   --   --   --   --   HEPARINUNFRC  --  <0.10*  --  0.33 0.39  --   --   --   --   --   --   --   --   CREATININE 2.19*  --    < >  --  1.93*  --   --   2.22*  --  3.65*   3.56*  --   --   --    < > = values in this interval not displayed.     Estimated Creatinine Clearance: 14.7 mL/min (A) (by C-G formula based on SCr of 3.56 mg/dL (H)).   Assessment: 71 yo female with PE s/p thrombectomy, possible HIT, for bivalirudin  Goal of Therapy:  PTT goal 50-85 sec Monitor platelets by anticoagulation protocol: Yes   Plan:  Continue bivalirudin at current rate   Phillis Knack, PharmD, BCPS  11/09/2021 1:44 AM
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
## 2021-11-09 NOTE — Progress Notes (Signed)
ANTICOAGULATION CONSULT NOTE   Pharmacy Consult for bivalirudin Indication: pulmonary embolus  Allergies  Allergen Reactions   Aspirin Nausea And Vomiting    Other reaction(s): Unknown   Benadryl [Diphenhydramine]     Can only take dye free. States the dye causes itching.   Darvon [Propoxyphene] Itching    Can only during the day. Night time makes her itch   Hydrocodone Bit-Homatrop Mbr Itching   Metformin     Other reaction(s): GI   Other     Other reaction(s): Unknown. States reaction was to nasal spray that caused pupils to shrink   Pentazocine     nervous Other reaction(s): jitters   Percocet [Oxycodone-Acetaminophen]     itching   Sulfa Antibiotics Hives   Tetracaine Hcl     Other reaction(s): Unknown. Thinks caused itching.   Tetracyclines & Related Hives   Tramadol     Other reaction(s): itch    Patient Measurements: Height: 5\' 5"  (165.1 cm) Weight: 77.2 kg (170 lb 3.1 oz) IBW/kg (Calculated) : 57  Vital Signs: Temp: 98.5 F (36.9 C) (01/13 2019) Temp Source: Axillary (01/13 2019) BP: 141/73 (01/13 2019) Pulse Rate: 89 (01/13 2019)  Labs: Recent Labs    11/07/21 0416 11/07/21 1334 11/07/21 1500 11/07/21 1523 11/07/21 2340 11/08/21 0420 11/08/21 1208 11/08/21 1335 11/09/21 0016 11/09/21 0458 11/09/21 1445 11/09/21 1950  HGB 8.9*   < >  --   --   --  7.9*  --  7.5*  --  7.3* 7.0*  --   HCT 26.9*   < >  --   --   --  23.4*  --  22.0*  --  21.5* 21.1*  --   PLT 5*   < > <5*  --   --  <5* 5*  --   --  8* 9*  --   APTT  --   --  100*  --    < > 64*  --   --  56* 59*  --  54*  LABPROT  --   --  14.7  --   --   --   --   --   --   --   --   --   INR  --   --  1.2  --   --   --   --   --   --   --   --   --   HEPARINUNFRC 0.39  --   --   --   --   --   --   --   --   --   --   --   CREATININE 1.93*  --   --  2.22*  --  3.65*   3.56*  --   --   --  4.20*  --   --    < > = values in this interval not displayed.     Estimated Creatinine Clearance: 12.4  mL/min (A) (by C-G formula based on SCr of 4.2 mg/dL (H)).   Assessment: 71 yo female with saddle PE - s/p thrombectomy and started on heparin - then stopped after intraperitoneal bleed.  Restarted at low dose.  Heme/onc consulted as platelets were 5 after 2 transfusions of platelets. HIT Ab and SRA both were negative. Heme suspected that HIT could still be an issue and bivalirudin will be used for anticoagulation instead of heparin. Avoiding argatroban with liver dysfunction.  1/12 pm Pt with reduced responsiveness so bivalirudin stopped  and pt taken for head CT. Head CT negative. Bivalirudin restarted ~1845.  Repeat aPTT is therapeutic at 54 seconds.  Goal of Therapy:  aPTT goal 50-85 sec Monitor platelets by anticoagulation protocol: Yes   Plan:  Continue bivalirudin 0.05mg /kg/hr F/u 12hr aPTT d/t renal function and high risk of bleeding  Arrie Senate, PharmD, BCPS, [facility] Clinical Pharmacist [phone] Please check AMION for all [facility] numbers 11/09/2021

## 2021-11-10 ENCOUNTER — Inpatient Hospital Stay (HOSPITAL_COMMUNITY): Payer: Medicare PPO

## 2021-11-10 DIAGNOSIS — D65 Disseminated intravascular coagulation [defibrination syndrome]: Secondary | ICD-10-CM | POA: Diagnosis not present

## 2021-11-10 DIAGNOSIS — I2602 Saddle embolus of pulmonary artery with acute cor pulmonale: Secondary | ICD-10-CM | POA: Diagnosis not present

## 2021-11-10 DIAGNOSIS — I469 Cardiac arrest, cause unspecified: Secondary | ICD-10-CM | POA: Diagnosis not present

## 2021-11-10 LAB — PREPARE PLATELET PHERESIS
Unit division: 0
Unit division: 0
Unit division: 0

## 2021-11-10 LAB — COMPREHENSIVE METABOLIC PANEL
ALT: 117 U/L — ABNORMAL HIGH (ref 0–44)
AST: 255 U/L — ABNORMAL HIGH (ref 15–41)
Albumin: 2.4 g/dL — ABNORMAL LOW (ref 3.5–5.0)
Alkaline Phosphatase: 233 U/L — ABNORMAL HIGH (ref 38–126)
Anion gap: 16 — ABNORMAL HIGH (ref 5–15)
BUN: 131 mg/dL — ABNORMAL HIGH (ref 8–23)
CO2: 20 mmol/L — ABNORMAL LOW (ref 22–32)
Calcium: 8.1 mg/dL — ABNORMAL LOW (ref 8.9–10.3)
Chloride: 103 mmol/L (ref 98–111)
Creatinine, Ser: 5.72 mg/dL — ABNORMAL HIGH (ref 0.44–1.00)
GFR, Estimated: 7 mL/min — ABNORMAL LOW (ref >60)
Glucose, Bld: 224 mg/dL — ABNORMAL HIGH (ref 70–99)
Potassium: 3.7 mmol/L (ref 3.5–5.1)
Sodium: 139 mmol/L (ref 135–145)
Total Bilirubin: 2.1 mg/dL — ABNORMAL HIGH (ref 0.3–1.2)
Total Protein: 5.8 g/dL — ABNORMAL LOW (ref 6.5–8.1)

## 2021-11-10 LAB — BASIC METABOLIC PANEL
Anion gap: 15 (ref 5–15)
BUN: 167 mg/dL — ABNORMAL HIGH (ref 8–23)
CO2: 17 mmol/L — ABNORMAL LOW (ref 22–32)
Calcium: 7.5 mg/dL — ABNORMAL LOW (ref 8.9–10.3)
Chloride: 98 mmol/L (ref 98–111)
Creatinine, Ser: 6.85 mg/dL — ABNORMAL HIGH (ref 0.44–1.00)
GFR, Estimated: 6 mL/min — ABNORMAL LOW (ref >60)
Glucose, Bld: 571 mg/dL (ref 70–99)
Potassium: 3.7 mmol/L (ref 3.5–5.1)
Sodium: 130 mmol/L — ABNORMAL LOW (ref 135–145)

## 2021-11-10 LAB — POCT I-STAT EG7
Acid-base deficit: 12 mmol/L — ABNORMAL HIGH (ref 0.0–2.0)
Bicarbonate: 13.3 mmol/L — ABNORMAL LOW (ref 20.0–28.0)
Calcium, Ion: 1 mmol/L — ABNORMAL LOW (ref 1.15–1.40)
HCT: 24 % — ABNORMAL LOW (ref 36.0–46.0)
Hemoglobin: 8.2 g/dL — ABNORMAL LOW (ref 12.0–15.0)
O2 Saturation: 64 %
Patient temperature: 98.1
Potassium: 2.7 mmol/L — CL (ref 3.5–5.1)
Sodium: 142 mmol/L (ref 135–145)
TCO2: 14 mmol/L — ABNORMAL LOW (ref 22–32)
pCO2, Ven: 27.9 mmHg — ABNORMAL LOW (ref 44.0–60.0)
pH, Ven: 7.286 (ref 7.250–7.430)
pO2, Ven: 36 mmHg (ref 32.0–45.0)

## 2021-11-10 LAB — GLUCOSE, CAPILLARY
Glucose-Capillary: 154 mg/dL — ABNORMAL HIGH (ref 70–99)
Glucose-Capillary: 155 mg/dL — ABNORMAL HIGH (ref 70–99)
Glucose-Capillary: 155 mg/dL — ABNORMAL HIGH (ref 70–99)
Glucose-Capillary: 165 mg/dL — ABNORMAL HIGH (ref 70–99)
Glucose-Capillary: 167 mg/dL — ABNORMAL HIGH (ref 70–99)
Glucose-Capillary: 169 mg/dL — ABNORMAL HIGH (ref 70–99)
Glucose-Capillary: 191 mg/dL — ABNORMAL HIGH (ref 70–99)
Glucose-Capillary: 196 mg/dL — ABNORMAL HIGH (ref 70–99)
Glucose-Capillary: 209 mg/dL — ABNORMAL HIGH (ref 70–99)
Glucose-Capillary: 215 mg/dL — ABNORMAL HIGH (ref 70–99)
Glucose-Capillary: 216 mg/dL — ABNORMAL HIGH (ref 70–99)
Glucose-Capillary: 226 mg/dL — ABNORMAL HIGH (ref 70–99)
Glucose-Capillary: 238 mg/dL — ABNORMAL HIGH (ref 70–99)
Glucose-Capillary: 250 mg/dL — ABNORMAL HIGH (ref 70–99)
Glucose-Capillary: 297 mg/dL — ABNORMAL HIGH (ref 70–99)
Glucose-Capillary: 354 mg/dL — ABNORMAL HIGH (ref 70–99)

## 2021-11-10 LAB — TRIGLYCERIDES: Triglycerides: 136 mg/dL (ref <150)

## 2021-11-10 LAB — BPAM PLATELET PHERESIS
Blood Product Expiration Date: 202301142359
Blood Product Expiration Date: 202301141858
Blood Product Expiration Date: 202301162359
ISSUE DATE / TIME: 202301130912
ISSUE DATE / TIME: 202301131951
Unit Type and Rh: 6200
Unit Type and Rh: 6200
Unit Type and Rh: 6200

## 2021-11-10 LAB — TYPE AND SCREEN
ABO/RH(D): A POS
Antibody Screen: NEGATIVE
Unit division: 0

## 2021-11-10 LAB — MAGNESIUM: Magnesium: 2 mg/dL (ref 1.7–2.4)

## 2021-11-10 LAB — BPAM RBC
Blood Product Expiration Date: 202302072359
ISSUE DATE / TIME: 202301132307
Unit Type and Rh: 6200

## 2021-11-10 LAB — CBC
HCT: 25.8 % — ABNORMAL LOW (ref 36.0–46.0)
Hemoglobin: 8.5 g/dL — ABNORMAL LOW (ref 12.0–15.0)
MCH: 28.4 pg (ref 26.0–34.0)
MCHC: 32.9 g/dL (ref 30.0–36.0)
MCV: 86.3 fL (ref 80.0–100.0)
Platelets: 14 10*3/uL — CL (ref 150–400)
RBC: 2.99 MIL/uL — ABNORMAL LOW (ref 3.87–5.11)
RDW: 18.7 % — ABNORMAL HIGH (ref 11.5–15.5)
WBC: 23.8 10*3/uL — ABNORMAL HIGH (ref 4.0–10.5)
nRBC: 0.2 % (ref 0.0–0.2)

## 2021-11-10 LAB — APTT
aPTT: 41 seconds — ABNORMAL HIGH (ref 24–36)
aPTT: 42 seconds — ABNORMAL HIGH (ref 24–36)
aPTT: 51 seconds — ABNORMAL HIGH (ref 24–36)

## 2021-11-10 LAB — BETA-2-GLYCOPROTEIN I ABS, IGG/M/A
Beta-2 Glyco I IgG: <9 GPI IgG units (ref 0–20)
Beta-2-Glycoprotein I IgA: <9 GPI IgA units (ref 0–25)
Beta-2-Glycoprotein I IgM: <9 GPI IgM units (ref 0–32)

## 2021-11-10 LAB — LACTATE DEHYDROGENASE: LDH: 1422 U/L — ABNORMAL HIGH (ref 98–192)

## 2021-11-10 MED ORDER — INSULIN DETEMIR 100 UNIT/ML ~~LOC~~ SOLN
24.0000 [IU] | Freq: Two times a day (BID) | SUBCUTANEOUS | Status: DC
  Administered 2021-11-10 (×2): 24 [IU] via SUBCUTANEOUS
  Filled 2021-11-10 (×5): qty 0.24

## 2021-11-10 MED ORDER — CALCIUM GLUCONATE-NACL 1-0.675 GM/50ML-% IV SOLN
1.0000 g | Freq: Once | INTRAVENOUS | Status: DC
  Filled 2021-11-10: qty 50

## 2021-11-10 MED ORDER — SODIUM ZIRCONIUM CYCLOSILICATE 5 G PO PACK: 15.0000 g | PACK | Freq: Once | ORAL | Status: DC

## 2021-11-10 MED ORDER — TRACE MINERALS CU-MN-SE-ZN 300-55-60-3000 MCG/ML IV SOLN
INTRAVENOUS | Status: AC
  Filled 2021-11-10: qty 806.4

## 2021-11-10 MED ORDER — MICONAZOLE NITRATE 2 % EX CREA
TOPICAL_CREAM | Freq: Two times a day (BID) | CUTANEOUS | Status: AC | PRN
  Filled 2021-11-10: qty 28.4

## 2021-11-10 MED ORDER — SODIUM POLYSTYRENE SULFONATE 15 GM/60ML PO SUSP: 15.0000 g | Freq: Once | ORAL | Status: DC

## 2021-11-10 MED ORDER — SODIUM CHLORIDE 3 % IN NEBU
4.0000 mL | INHALATION_SOLUTION | Freq: Two times a day (BID) | RESPIRATORY_TRACT | Status: DC
  Administered 2021-11-10 (×2): 4 mL via RESPIRATORY_TRACT
  Filled 2021-11-10 (×3): qty 4

## 2021-11-10 MED ORDER — INSULIN ASPART 100 UNIT/ML IV SOLN: 10.0000 [IU] | Freq: Once | INTRAVENOUS | Status: DC

## 2021-11-10 MED ORDER — INSULIN ASPART 100 UNIT/ML IJ SOLN
2.0000 [IU] | INTRAMUSCULAR | Status: DC
  Administered 2021-11-10: 6 [IU] via SUBCUTANEOUS

## 2021-11-10 MED ORDER — DEXTROSE 50 % IV SOLN
1.0000 | Freq: Once | INTRAVENOUS | Status: DC
  Filled 2021-11-10: qty 50

## 2021-11-10 MED ORDER — INSULIN ASPART 100 UNIT/ML IJ SOLN
0.0000 [IU] | INTRAMUSCULAR | Status: DC
  Administered 2021-11-10: 11 [IU] via SUBCUTANEOUS

## 2021-11-10 NOTE — Progress Notes (Signed)
eLink Physician-Brief Progress Note Patient Name: [name] DOB: 1951-01-22 MRN: [id number]   Date of Service  11/10/2021  HPI/Events of Note  Thick respiratory secretions. Nursing request for NT suctioning.   eICU Interventions  Plan: NT suction PRN.      Intervention Category Major Interventions: Other:  Lysle Dingwall 11/10/2021, 8:49 PM

## 2021-11-10 NOTE — Progress Notes (Signed)
[place] for bivalirudin Indication: pulmonary embolus Brief A/P: aPTT subtherapeutic  Increase bivalirudin rate   Allergies  Allergen Reactions   Aspirin Nausea And Vomiting    Other reaction(s): Unknown   Benadryl [Diphenhydramine]     Can only take dye free. States the dye causes itching.   [place]von [Propoxyphene] Itching    Can only during the day. Night time makes her itch   Hydrocodone Bit-Homatrop Mbr Itching   Metformin     Other reaction(s): GI   Other     Other reaction(s): Unknown. States reaction was to nasal spray that caused pupils to shrink   Pentazocine     nervous Other reaction(s): jitters   Percocet [Oxycodone-Acetaminophen]     itching   Sulfa Antibiotics Hives   Tetracaine Hcl     Other reaction(s): Unknown. Thinks caused itching.   Tetracyclines & Related Hives   Tramadol     Other reaction(s): itch    Patient Measurements: Height: 5\' 5"  (165.1 cm) Weight: 78.6 kg (173 lb 4.5 oz) IBW/kg (Calculated) : 57  Vital Signs: Temp: 98.2 F (36.8 C) (01/14 0353) Temp Source: Oral (01/14 0353) BP: 155/78 (01/14 0600) Pulse Rate: 102 (01/14 0600)  Labs: Recent Labs    11/07/21 1500 11/07/21 1523 11/08/21 0420 11/08/21 1208 11/09/21 0458 11/09/21 1445 11/09/21 1950 11/10/21 0459  HGB  --   --  7.9*   < > 7.3* 7.0*  --  8.5*  HCT  --   --  23.4*   < > 21.5* 21.1*  --  25.8*  PLT <5*  --  <5*   < > 8* 9*  --  14*  APTT 100*   < > 64*   < > 59*  --  54* 41*  LABPROT 14.7  --   --   --   --   --   --   --   INR 1.2  --   --   --   --   --   --   --   CREATININE  --    < > 3.65*   3.56*  --  4.20*  --   --  5.72*   < > = values in this interval not displayed.     Estimated Creatinine Clearance: 9.2 mL/min (A) (by C-G formula based on SCr of 5.72 mg/dL (H)).   Assessment: 71 yo female with PE s/p thrombectomy, possible HIT, for bivalirudin  Goal of Therapy:  PTT goal 50-85 sec Monitor platelets by  anticoagulation protocol: Yes   Plan:  Increase bivalirudin 0.07 mg/kg/hr aPTT in 4 hours  Phillis Knack, PharmD, BCPS  11/10/2021 6:24 AM
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
## 2021-11-10 NOTE — Progress Notes (Signed)
eLink Physician-Brief Progress Note Patient Name: [name] DOB: [birth year] MRN: [id number]   Date of Service  11/10/2021  HPI/Events of Note  Review of portable CXR reveals: 1. Low lung volumes, with improved aeration at the lung bases. 2. Central vascular congestion without overt edema.  eICU Interventions  Plan: HD as planned ASAP.     Intervention Category Major Interventions: Other:  Lysle Dingwall 11/10/2021, 11:25 PM

## 2021-11-10 NOTE — Progress Notes (Signed)
PHARMACY - TOTAL PARENTERAL NUTRITION CONSULT NOTE  Indication:  Intolerance of enteral feeding and unable to obtain enteral access  Patient Measurements: Height: 5\' 5"  (165.1 cm) Weight: 78.6 kg (173 lb 4.5 oz) IBW/kg (Calculated) : 57   Body mass index is 28.84 kg/m.  Assessment:  18 YOF who was admitted 10/28/21 after cardiac arrest and out of hospital CPR due to large saddle PE. Patient has been intubated and course complicated by uGIB and significantly low platelets, acute hepatic and renal failure.  Unable to place Cortrak due to thrombocytopenia, so Pharmacy consulted to manage TPN.   Glucose / Insulin: hx DM possibly on Lantus 22/d and Prandin 2mg  BID PTA CBGs elevated 150-210s on insulin drip at 9.5 units/hr, anion gap 16 Decadron 20mg  daily, last dose 1/14 Electrolytes: low CO2, others WNL Renal: SCr up to 5.72 (BL SCr ~1.1).  CRRT >> iHD, last session 1/12 > next 1/14 Hepatic: LFTs elevated but stable overall (elevated prior to TPN initiation), tbili 2.1 (no jaundice). TG WNL, albumin 2.4 Intake / Output; MIVF: net +1.8L, LBM 1/12 GI Imaging: none since start of TPN GI Surgeries / Procedures: none since start of TPN  Central access: Triple lumen CVC placed 10/28/21 (confirmed with RN dedicated lumen available; confirmed with CCM ok to use line for TPN despite age of line; unable to place new line due to AKI and platelets) TPN start date: 11/08/21  Nutritional Goals: RD Estimated Needs Total Energy Estimated Needs: 1950-2150 kcals Total Protein Estimated Needs: 100-125 g Total Fluid Estimated Needs: >/= 1.8 L  Current Nutrition:  TPN  Plan:  Continue concentrated TPN at goal rate 70 mL/hr to provide 121g AA, 252g CHO and 61g ILE for a total of 1953 kCal, meeting 100% of needs Electrolytes in TPN: increase Na to 55mEq/L to provide more acetate, K 46mEq/L, Ca 2.34mEq/L, Mg 25mEq/L, Phos 45mmol/L, Cl:Ac 1:2.  May need to add K/Mag/Phos to TPN eventually. Add standard MVI and  trace elements to TPN - remove chromium with HD needs Continue insulin drip per primary team; last dose of Decadron today 1/14 Standard TPN labs on Mon/Thurs - daily CMP per MD through 1/16, check Mag/Phos in AM Monitor platelets and ability to have tube placed for enteral feeding  Gregorey Nabor D. Mina Marble, PharmD, BCPS, [place] 11/10/2021, 7:52 AM

## 2021-11-10 NOTE — Progress Notes (Signed)
eLink Physician-Brief Progress Note Patient Name: [name] DOB: [birth year] MRN: [id number]   Date of Service  11/10/2021  HPI/Events of Note  Hyperglycemia - Blood glucose = 354. Currently on Levemir and Q 4 hour standard Novolog SSI.  eICU Interventions  Plan: Advance to Q 4 hour moderate SSI coverage.     Intervention Category Major Interventions: Hyperglycemia - active titration of insulin therapy  Lysle Dingwall 11/10/2021, 8:18 PM

## 2021-11-10 NOTE — Progress Notes (Signed)
I believe that [name] is improving with her hematologic parameters.  Her platelet count is 14,000 this morning.  I was told by the nurse that she did get a unit of blood and a unit of platelets yesterday.  I would not give her any platelets unless she is bleeding.  Think the anemia is in part from her renal failure.  I cannot give her ESA because of her thromboembolic disease.  She still has significant and renal failure.  She has significant hepatic insufficiency.  She is still on TNA.  She is on the Angiomax infusion for her pulmonary embolism.  Hopefully, we will be able to switch over to an oral agent.  The confirmatory test for HIT was negative.  I am still not sure as to why she had this extensive pulmonary embolism.  I probably would not do any additional testing until she is more stable.  Hopefully, she will be able to eat soon.  I know that speech pathology is working with her.  We are also going to have to repeat the liver MRI once she stabilizes to see exactly what is going on in the liver.  She could certainly have a hypercoagulable state if she has the potential for malignancy.  Today, her BUN is 131 creatinine 5.72.  Her total bilirubin is 2.1.  LDH is 1422.  AST is 255 with a ALT of 117.  Her white cell count is still elevated 23.8.  Hemoglobin 8.5.  Platelet count 14,000.  I do still have her on Decadron.  I think this will be her last dose of Decadron.  I gave her Nplate last week.  I still wonder if there is not a component of immune thrombocytopenia for the profound thrombocytopenia.  I would like to believe that her platelet count will still slowly improve.  I would think that as her other organs begin to improve, her platelet count will improve.  As always, we had a good prayer.  One of her daughters was with this.  I gave her an update from a hematologic point of view.  As always, I am totally impressed by the incredible care that she is getting by the staff  down the cardiac ICU.  [name], MD  Oswaldo Milian 41:10

## 2021-11-10 NOTE — Progress Notes (Signed)
ANTICOAGULATION CONSULT NOTE  Pharmacy Consult for bivalirudin Indication: pulmonary embolus  Allergies  Allergen Reactions   Aspirin Nausea And Vomiting    Other reaction(s): Unknown   Benadryl [Diphenhydramine]     Can only take dye free. States the dye causes itching.   Darvon [Propoxyphene] Itching    Can only during the day. Night time makes her itch   Hydrocodone Bit-Homatrop Mbr Itching   Metformin     Other reaction(s): GI   Other     Other reaction(s): Unknown. States reaction was to nasal spray that caused pupils to shrink   Pentazocine     nervous Other reaction(s): jitters   Percocet [Oxycodone-Acetaminophen]     itching   Sulfa Antibiotics Hives   Tetracaine Hcl     Other reaction(s): Unknown. Thinks caused itching.   Tetracyclines & Related Hives   Tramadol     Other reaction(s): itch    Patient Measurements: Height: 5\' 5"  (165.1 cm) Weight: 78.6 kg (173 lb 4.5 oz) IBW/kg (Calculated) : 57  Vital Signs: Temp: 98.1 F (36.7 C) (01/14 1927) Temp Source: Oral (01/14 1927) BP: 158/73 (01/14 1900) Pulse Rate: 84 (01/14 1900)  Labs: Recent Labs    11/08/21 0420 11/08/21 1208 11/09/21 0458 11/09/21 1445 11/09/21 1950 11/10/21 0459 11/10/21 1200 11/10/21 1828  HGB 7.9*   < > 7.3* 7.0*  --  8.5*  --   --   HCT 23.4*   < > 21.5* 21.1*  --  25.8*  --   --   PLT <5*   < > 8* 9*  --  14*  --   --   APTT 64*   < > 59*  --    < > 41* 42* 51*  CREATININE 3.65*   3.56*  --  4.20*  --   --  5.72*  --   --    < > = values in this interval not displayed.     Estimated Creatinine Clearance: 9.2 mL/min (A) (by C-G formula based on SCr of 5.72 mg/dL (H)).   Assessment: 71 yo female with PE s/p thrombectomy, possible HIT, for bivalirudin. aPTT today subtherapeutic at 42 on 0.07 mg/kg bilvalirudin. Hemoglobin improved to 8.5 and plates improved from 8 to 14. No bleeding per RN.  Repeat aPTT is therapeutic.  Goal of Therapy:  PTT goal 50-85 sec Monitor  platelets by anticoagulation protocol: Yes   Plan:  Continue bivalirudin 0.09 mg/kg/hr aPTT in am   Arrie Senate, PharmD, [place], [facility] Clinical Pharmacist [phone] Please check AMION for all [facility] numbers 11/10/2021

## 2021-11-10 NOTE — Progress Notes (Signed)
eLink Physician-Brief Progress Note Patient Name: [name] DOB: 13-May-1951 MRN: [id number]   Date of Service  11/10/2021  HPI/Events of Note  Episode of hypoxia - Now sat = 97% and RR = 19 s/p NT suctioning and Albuterol Neb. Volume overload? HD planned for tonight.   eICU Interventions  Plan: Portable CXR STAT. Will request that the PCCM ground team evaluate the patient at bedside.      Intervention Category Major Interventions: Hypoxemia - evaluation and management  Lysle Dingwall 11/10/2021, 9:55 PM

## 2021-11-10 NOTE — Progress Notes (Signed)
[place] for bivalirudin Indication: pulmonary embolus Brief A/P: aPTT subtherapeutic  Increase bivalirudin rate   Allergies  Allergen Reactions   Aspirin Nausea And Vomiting    Other reaction(s): Unknown   Benadryl [Diphenhydramine]     Can only take dye free. States the dye causes itching.   Darvon [Propoxyphene] Itching    Can only during the day. Night time makes her itch   Hydrocodone Bit-Homatrop Mbr Itching   Metformin     Other reaction(s): GI   Other     Other reaction(s): Unknown. States reaction was to nasal spray that caused pupils to shrink   Pentazocine     nervous Other reaction(s): jitters   Percocet [Oxycodone-Acetaminophen]     itching   Sulfa Antibiotics Hives   Tetracaine Hcl     Other reaction(s): Unknown. Thinks caused itching.   Tetracyclines & Related Hives   Tramadol     Other reaction(s): itch    Patient Measurements: Height: 5\' 5"  (165.1 cm) Weight: 78.6 kg (173 lb 4.5 oz) IBW/kg (Calculated) : 57  Vital Signs: Temp: 98.8 F (37.1 C) (01/14 1200) Temp Source: Oral (01/14 1200) BP: 150/76 (01/14 1200) Pulse Rate: 84 (01/14 1200)  Labs: Recent Labs    11/07/21 1500 11/07/21 1523 11/08/21 0420 11/08/21 1208 11/09/21 0458 11/09/21 1445 11/09/21 1950 11/10/21 0459 11/10/21 1200  HGB  --   --  7.9*   < > 7.3* 7.0*  --  8.5*  --   HCT  --   --  23.4*   < > 21.5* 21.1*  --  25.8*  --   PLT <5*  --  <5*   < > 8* 9*  --  14*  --   APTT 100*   < > 64*   < > 59*  --  54* 41* 42*  LABPROT 14.7  --   --   --   --   --   --   --   --   INR 1.2  --   --   --   --   --   --   --   --   CREATININE  --    < > 3.65*   3.56*  --  4.20*  --   --  5.72*  --    < > = values in this interval not displayed.     Estimated Creatinine Clearance: 9.2 mL/min (A) (by C-G formula based on SCr of 5.72 mg/dL (H)).   Assessment: 71 yo female with PE s/p thrombectomy, possible HIT, for bivalirudin. aPTT today  subtherapeutic at 42 on 0.07 mg/kg bilvalirudin. Hemoglobin improved to 8.5 and plates improved from 8 to 14. No bleeding per RN.  Goal of Therapy:  PTT goal 50-85 sec Monitor platelets by anticoagulation protocol: Yes   Plan:  Increase bivalirudin 0.09 mg/kg/hr aPTT in 4 hours  Cathrine Muster, PharmD PGY2 Cardiology Pharmacy Resident Phone: [phone] 11/10/2021  1:06 PM  Please check [email or web address] for unit-specific pharmacy phone numbers.

## 2021-11-10 NOTE — Progress Notes (Signed)
[facility] Progress Note     Assessment/ Plan:   Renal failure secondary to ATN in the setting of cardiac arrest, hypotension, pressors, contrast.  BL CKD3A with BL creatinine in the 1.17-1.3 range in late 2021. - R fem HD cath - CRRT 10/31/20 - changed to all 4K bath1/9/23-1/11/23 -HD 11/08/21--> had to have pressor with it - next HD treatment today 11/10/20--> increase UF goal d/t obligate intake--> BP better today, may not need pressor with it   Prolonged VF cardiac arrest--> OOH VF arrest 2/2 massive PE Massive PE s/p mechanical thrombectomy 1/2 of R and L PA.  Vasc US- no DVT, liver mass noted 10/28/21 CT abd/ pelvis, s/p MRI abd--> incompletely characterized Retroperitoneal bleed/Hemorrhagic shock/Acute blood loss anemia originating from left liver lobe s/p embolization 1/4--> required FFP, plts, Vit K, pRBCs Thrombocytopenia- component of DIC suspected, HIT negative, heparin --> bivalirudin, getting platelet transfusions, heme involved, suspects ITP, getting Nplate and dex DM HTN Acute hypoxic RF- extubated 11/07/21 Sepsis/ leukocytosis/ aspiration pneumonia- on meropenem, WBC ct increasing to 25.1, cultures negative so far--> MRI demonstrated aspiration pneumonia Dispo: ICU  Subjective:    For HD today.  Can't tolerate midodrine since it's PO- but BP much better so may not need in HD.     Objective:   BP (!) 153/74    Pulse 91    Temp 98.1 F (36.7 C) (Oral)    Resp 16    Ht 5\' 5"  (1.651 m)    Wt 78.6 kg    SpO2 97%    BMI 28.84 kg/m   Intake/Output Summary (Last 24 hours) at 11/10/2021 0954 Last data filed at 11/10/2021 0900 Gross per 24 hour  Intake 4464.23 ml  Output --  Net 4464.23 ml   Weight change: -0.4 kg  Physical Exam: GEN: NAD, sitting up in bed HEENT:  sclerae anicteric NECK: Supple, no thyromegaly LUNGS: decreased air movement bilaterally, some crackles, productive cough CV: Tachy ABD: soft, nontender EXT: 1+ LE edema, in heel protectors,  UE edema nearly resolved ACCESS: L fem nontunneled HD cath   Imaging: CT HEAD WO CONTRAST (5MM)  Result Date: 11/08/2021 CLINICAL DATA:  Delirium. EXAM: CT HEAD WITHOUT CONTRAST TECHNIQUE: Contiguous axial images were obtained from the base of the skull through the vertex without intravenous contrast. RADIATION DOSE REDUCTION: This exam was performed according to the departmental dose-optimization program which includes automated exposure control, adjustment of the mA and/or kV according to patient size and/or use of iterative reconstruction technique. COMPARISON:  Head CT 11/04/2021 FINDINGS: Brain: Mild motion artifact limitations. No intracranial hemorrhage, mass effect, or midline shift. No hydrocephalus. The basilar cisterns are patent. For with differentiation is preserved. No evidence of territorial infarct or acute ischemia. No extra-axial or intracranial fluid collection. Vascular: Atherosclerosis of skullbase vasculature without hyperdense vessel or abnormal calcification. Skull: No fracture or focal lesion. Sinuses/Orbits: Paranasal sinuses and mastoid air cells are clear. The visualized orbits are unremarkable. Other: None. IMPRESSION: No acute intracranial abnormality, allowing for motion artifact limitations. Electronically Signed   By: Keith Rake M.D.   On: 11/08/2021 17:59   MR LIVER WO CONRTAST  Result Date: 11/09/2021 CLINICAL DATA:  71 year old female with history of intraperitoneal hemorrhage. Additional intraparenchymal hemorrhage noted within the liver. Evaluate for underlying hepatocellular carcinoma or other hepatic mass. EXAM: MRI ABDOMEN WITHOUT CONTRAST TECHNIQUE: Multiplanar multisequence MR imaging was performed without the administration of intravenous contrast. COMPARISON:  No prior abdominal MRI. CT of the abdomen and pelvis 10/31/2011 scratch  the CT the abdomen and pelvis 10/30/2021. FINDINGS: Comment: Today's study is limited for detection and characterization of  visceral and/or vascular lesions by lack of IV gadolinium. Additionally, extensive patient motion severely limits today's examination. Lower chest: High signal intensity in the lower thorax dependently, likely a combination of layering pleural effusions and extensive airspace consolidation in the dependent portions of the lungs, poorly evaluated by MRI. Allowing for differences between modalities, findings appear very similar to prior CT of the abdomen and pelvis 10/30/2021. Hepatobiliary: In the left lobe of the liver centered predominantly in segment 3 there is a expansile area within the parenchyma which is very heterogeneous in signal intensity on T1 and T2 weighted images, including some dependent areas of T1 hyperintensity. The remainder of the liver is otherwise normal in appearance, without additional suspicious cystic or solid hepatic lesions. No intra or extrahepatic biliary ductal dilatation. Gallbladder is moderately distended, also demonstrating heterogeneous signal intensity. Gallbladder wall appears mildly irregular, and there is some fluid adjacent to the gallbladder posteriorly, but no circumferential pericholecystic fluid to clearly indicate an acute cholecystitis at this time. Pancreas: No definite pancreatic mass or peripancreatic fluid collections or inflammatory changes noted on today's noncontrast examination. Spleen:  Unremarkable. Adrenals/Urinary Tract: Unenhanced appearance of the kidneys and bilateral adrenal glands is normal. No hydroureteronephrosis in the visualized portions of the abdomen. Stomach/Bowel: Visualized portions are unremarkable. Vascular/Lymphatic: No aneurysm identified in the visualized abdominal vasculature. No lymphadenopathy noted in the abdomen. Other: Moderate volume of free intraperitoneal fluid, best visualized in the pericolic gutters (right greater than left). This is slightly T1 hyperintense and T2 iso to slightly hyperintense, compatible with indwelling blood  products. Musculoskeletal: No aggressive appearing osseous lesions are noted in the visualized portions of the skeleton. IMPRESSION: 1. Findings appear very similar to the recent CT examination. Unfortunately, without IV gadolinium, the large intraparenchymal hepatic lesion centered predominantly in segment 3 of the liver cannot be characterized. This is once again favored to represent a large intrahepatic hemorrhage, although underlying mass lesion is difficult to exclude. If there is elevated AFP level or other clinical concern for hepatic malignancy, repeat abdominal MRI with and without IV gadolinium would be recommended, preferably after resolution of the patient's acute illness to allow for proper patient breath-holding, to better evaluate these findings and exclude underlying malignancy. 2. Distended gallbladder which demonstrates heterogeneous signal intensity, likely in part related to vicarious excretion of iodinated contrast material, but also potentially related to the presence of blood products. Gallbladder is slightly more distended than prior CT examination. If there is clinical concern for cholecystitis, further evaluation with nuclear medicine hepatobiliary scan should be considered. 3. Intraperitoneal hemorrhage redemonstrated. 4. Severe bilateral lower lobe pneumonia and trace parapneumonic pleural effusions. Given the distribution, this likely represent severe aspiration pneumonia. Electronically Signed   By: Vinnie Langton M.D.   On: 11/09/2021 05:33    Labs: BMET Recent Labs  Lab 11/05/21 1631 11/06/21 0356 11/06/21 1615 11/07/21 0416 11/07/21 1523 11/08/21 0420 11/08/21 1335 11/09/21 0458 11/10/21 0459  NA 136 136 134* 136 134* 140   136 141 138 139  K 3.5 3.6 3.3* 3.8 4.0 4.5   4.5 4.1 3.9 3.7  CL 103 102 103 105 100 101   103  --  102 103  CO2 17* 19* 21* 21* 19* 21*   20*  --  21* 20*  GLUCOSE 170* 236* 268* 143* 224* 254*   261*  --  240* 224*  BUN 37* 40* 44* 40*  42*  69*   70*  --  83* 131*  CREATININE 2.25* 2.19* 2.13* 1.93* 2.22* 3.65*   3.56*  --  4.20* 5.72*  CALCIUM 7.3* 7.5* 7.5* 7.8* 7.9* 8.2*   7.8*  --  8.2* 8.1*  PHOS 2.4* 2.6 2.7 2.7 4.0 6.1*  --  5.1*  --    CBC Recent Labs  Lab 11/08/21 0420 11/08/21 1208 11/08/21 1335 11/09/21 0458 11/09/21 1445 11/10/21 0459  WBC 23.8*  --   --  21.5* 26.6* 23.8*  HGB 7.9*  --  7.5* 7.3* 7.0* 8.5*  HCT 23.4*  --  22.0* 21.5* 21.1* 25.8*  MCV 89.0  --   --  88.5 89.0 86.3  PLT <5* 5*  --  8* 9* 14*    Medications:     sodium chloride   Intravenous Once   chlorhexidine  15 mL Mouth Rinse BID   Chlorhexidine Gluconate Cloth  6 each Topical Daily   dexamethasone  20 mg Intravenous Q24H   mouth rinse  15 mL Mouth Rinse BID   mouth rinse  15 mL Mouth Rinse q12n4p   pantoprazole (PROTONIX) IV  40 mg Intravenous Q1400   sodium chloride flush  10-40 mL Intracatheter Q12H   sodium chloride HYPERTONIC  4 mL Nebulization BID      Madelon Lips, MD 11/10/2021, 9:54 AM

## 2021-11-10 NOTE — Evaluation (Signed)
Physical Therapy Evaluation Patient Details Name: [name] MRN: [id number] DOB: 1951/05/04 Today's Date: 11/10/2021  History of Present Illness  The pt is a 71 yo female presenting 1/1 after cardiac arrest requiring 20 min CPR for ROSC with pt intubated in the field prior to arrival. Pt had 2 additional codes in ED requiring CPR for 4 and 2 min respectively. Further work up revealed STEMI, presumed cardiogenic shock, large saddle PE, RUQ bleeding, and 2 rib fx. S/p PE thrombectomy 1/2, found to have intraperitoneal hemorrhage s/p embolization 1/5, extubated on 1/10.  PMH includes: fibromyalgia, HTN, DM II, CKD, GERD, and asthma.   Clinical Impression  Pt in bed upon arrival of PT, agreeable to evaluation at this time. Prior to admission the pt was independent with mobility, working out on a regular basis, and independent with all ADLs and IADLs without any issues. The pt now presents with limitations in functional mobility, strength, power, stability, activity tolerance, and muscular endurance due to above dx, and will continue to benefit from skilled PT to address these deficits. The pt is highly motivated but required min-modA to complete bed mobility and maxA of 2 to complete sit-stand from EOB. She was unable to take any pivotal steps to recliner for transfer, but was able to tolerate static standing while the bed was removed and chair was placed behind her. The pt pt did report significant fatigue after mobility and requested seated rest. Non-tunneled HD cath in L fem remained stable with no bleeding or pain with all mobility. Will continue to benefit from skilled PT acutely and intensive therapies following d/c to facilitate return to prior level of independence. Pt has great family support and is very motivated.         Recommendations for follow up therapy are one component of a multi-disciplinary discharge planning process, led by the attending physician.  Recommendations may be updated  based on patient status, additional functional criteria and insurance authorization.  Follow Up Recommendations Acute inpatient rehab (3hours/day)    Assistance Recommended at Discharge Frequent or constant Supervision/Assistance  Patient can return home with the following  Two people to help with walking and/or transfers;A little help with bathing/dressing/bathroom;Assistance with cooking/housework;Assist for transportation;Help with stairs or ramp for entrance    Equipment Recommendations  (defer to post acute)  Recommendations for Other Services  Rehab consult;OT consult    Functional Status Assessment Patient has had a recent decline in their functional status and demonstrates the ability to make significant improvements in function in a reasonable and predictable amount of time.     Precautions / Restrictions Precautions Precautions: Fall Precaution Comments: L femoral non-tunneled HD cath, nephrology cleared for sitting EOB and transfer to recliner. 2L O2 for mobility Restrictions Weight Bearing Restrictions: No      Mobility  Bed Mobility Overal bed mobility: Needs Assistance Bed Mobility: Rolling;Sidelying to Sit Rolling: Min assist Sidelying to sit: Mod assist       General bed mobility comments: minA to complete roll, verbal cues for pt to complete reaching with UE    Transfers Overall transfer level: Needs assistance Equipment used: Rolling walker (2 wheels) Transfers: Sit to/from Stand Sit to Stand: Max assist;+2 physical assistance           General transfer comment: pt able to power up to standing with maxA of 2, max cues for hip extension and posture/trunk elevation. progressed to maxA of 2 with assist at trunk and hip witih prolonged standing. chair brought behind the  pt after bed moved.    Ambulation/Gait               General Gait Details: unable to generate pivoting steps       Balance Overall balance assessment: Needs  assistance Sitting-balance support: Bilateral upper extremity supported;Feet supported Sitting balance-Leahy Scale: Fair Sitting balance - Comments: minG to minA for static sitting   Standing balance support: Bilateral upper extremity supported;Reliant on assistive device for balance Standing balance-Leahy Scale: Poor Standing balance comment: dependent on BUE support and maxA of 2                             Pertinent Vitals/Pain Pain Assessment: No/denies pain    Home Living Family/patient expects to be discharged to:: Private residence Living Arrangements: Alone Available Help at Discharge: Family;Available 24 hours/day Type of Home: Apartment Home Access: Stairs to enter Entrance Stairs-Rails: None Entrance Stairs-Number of Steps: 1 Alternate Level Stairs-Number of Steps: 6 stairs, landing, then 6 more Home Layout: Able to live on main level with bedroom/bathroom;Multi-level;1/2 bath on main level;Bed/bath upstairs Home Equipment: Shower seat;Cane - quad;BSC/3in1;Hand held shower head      Prior Function Prior Level of Function : Independent/Modified Independent;Driving             Mobility Comments: fully independent without use of DME outside of pain from fibromyalgia. going to [employer] ADLs Comments: fully independent     Hand Dominance   Dominant Hand: Right    Extremity/Trunk Assessment   Upper Extremity Assessment Upper Extremity Assessment: Generalized weakness;RUE deficits/detail RUE Deficits / Details: hypersensitive    Lower Extremity Assessment Lower Extremity Assessment: Generalized weakness;LLE deficits/detail;RLE deficits/detail RLE Deficits / Details: hypersensitive to touch, partial ROM against gravity LLE Deficits / Details: non-tunneled HD cath in groin, partial ROM against gravity    Cervical / Trunk Assessment Cervical / Trunk Assessment: Normal  Communication   Communication: Expressive difficulties  Cognition  Arousal/Alertness: Awake/alert Behavior During Therapy: Flat affect Overall Cognitive Status: Within Functional Limits for tasks assessed                                 General Comments: pt following cues, needing increased time to respond, soft voice        General Comments General comments (skin integrity, edema, etc.): HR to 138bpm with SpO2 stable on 2L        Assessment/Plan    PT Assessment Patient needs continued PT services  PT Problem List Decreased strength;Decreased range of motion;Decreased activity tolerance;Decreased balance;Decreased mobility;Decreased coordination;Decreased cognition;Pain       PT Treatment Interventions DME instruction;Gait training;Stair training;Functional mobility training;Therapeutic exercise;Therapeutic activities;Balance training;Patient/family education    PT Goals (Current goals can be found in the Care Plan section)  Acute Rehab PT Goals Patient Stated Goal: sit by a window PT Goal Formulation: With patient Time For Goal Achievement: 11/24/21 Potential to Achieve Goals: Good    Frequency Min 3X/week        AM-PAC PT "6 Clicks" Mobility  Outcome Measure Help needed turning from your back to your side while in a flat bed without using bedrails?: A Little Help needed moving from lying on your back to sitting on the side of a flat bed without using bedrails?: A Lot Help needed moving to and from a bed to a chair (including a wheelchair)?: Total Help needed standing up from a  chair using your arms (e.g., wheelchair or bedside chair)?: Total Help needed to walk in hospital room?: Total Help needed climbing 3-5 steps with a railing? : Total 6 Click Score: 9    End of Session Equipment Utilized During Treatment: Gait belt;Oxygen Activity Tolerance: Patient tolerated treatment well;Patient limited by fatigue Patient left: in chair;with call bell/phone within reach;with nursing/sitter in room;with family/visitor  present Nurse Communication: Mobility status PT Visit Diagnosis: Unsteadiness on feet (R26.81);Other abnormalities of gait and mobility (R26.89);Muscle weakness (generalized) (M62.81)    Time: 3225-6720 PT Time Calculation (min) (ACUTE ONLY): 45 min   Charges:   PT Evaluation $PT Eval High Complexity: 1 High PT Treatments $Therapeutic Activity: 23-37 mins        West Carbo, PT, DPT   Acute Rehabilitation Department Pager #: [phone]  Sandra Cockayne 11/10/2021, 4:54 PM

## 2021-11-11 DIAGNOSIS — N179 Acute kidney failure, unspecified: Secondary | ICD-10-CM

## 2021-11-11 DIAGNOSIS — I2602 Saddle embolus of pulmonary artery with acute cor pulmonale: Secondary | ICD-10-CM | POA: Diagnosis not present

## 2021-11-11 DIAGNOSIS — I469 Cardiac arrest, cause unspecified: Secondary | ICD-10-CM | POA: Diagnosis not present

## 2021-11-11 DIAGNOSIS — J9601 Acute respiratory failure with hypoxia: Secondary | ICD-10-CM | POA: Diagnosis not present

## 2021-11-11 LAB — CBC
HCT: 26.8 % — ABNORMAL LOW (ref 36.0–46.0)
Hemoglobin: 9.3 g/dL — ABNORMAL LOW (ref 12.0–15.0)
MCH: 29.7 pg (ref 26.0–34.0)
MCHC: 34.7 g/dL (ref 30.0–36.0)
MCV: 85.6 fL (ref 80.0–100.0)
Platelets: 15 10*3/uL — CL (ref 150–400)
RBC: 3.13 MIL/uL — ABNORMAL LOW (ref 3.87–5.11)
RDW: 19.6 % — ABNORMAL HIGH (ref 11.5–15.5)
WBC: 38.6 10*3/uL — ABNORMAL HIGH (ref 4.0–10.5)
nRBC: 0.4 % — ABNORMAL HIGH (ref 0.0–0.2)

## 2021-11-11 LAB — PROCALCITONIN: Procalcitonin: 8.95 ng/mL

## 2021-11-11 LAB — GLUCOSE, CAPILLARY
Glucose-Capillary: 136 mg/dL — ABNORMAL HIGH (ref 70–99)
Glucose-Capillary: 144 mg/dL — ABNORMAL HIGH (ref 70–99)
Glucose-Capillary: 151 mg/dL — ABNORMAL HIGH (ref 70–99)
Glucose-Capillary: 151 mg/dL — ABNORMAL HIGH (ref 70–99)
Glucose-Capillary: 153 mg/dL — ABNORMAL HIGH (ref 70–99)
Glucose-Capillary: 157 mg/dL — ABNORMAL HIGH (ref 70–99)
Glucose-Capillary: 160 mg/dL — ABNORMAL HIGH (ref 70–99)
Glucose-Capillary: 160 mg/dL — ABNORMAL HIGH (ref 70–99)
Glucose-Capillary: 171 mg/dL — ABNORMAL HIGH (ref 70–99)
Glucose-Capillary: 196 mg/dL — ABNORMAL HIGH (ref 70–99)
Glucose-Capillary: 224 mg/dL — ABNORMAL HIGH (ref 70–99)
Glucose-Capillary: 225 mg/dL — ABNORMAL HIGH (ref 70–99)
Glucose-Capillary: 245 mg/dL — ABNORMAL HIGH (ref 70–99)
Glucose-Capillary: 301 mg/dL — ABNORMAL HIGH (ref 70–99)
Glucose-Capillary: 352 mg/dL — ABNORMAL HIGH (ref 70–99)
Glucose-Capillary: 398 mg/dL — ABNORMAL HIGH (ref 70–99)
Glucose-Capillary: 433 mg/dL — ABNORMAL HIGH (ref 70–99)
Glucose-Capillary: 498 mg/dL — ABNORMAL HIGH (ref 70–99)

## 2021-11-11 LAB — COMPREHENSIVE METABOLIC PANEL
ALT: 79 U/L — ABNORMAL HIGH (ref 0–44)
AST: 117 U/L — ABNORMAL HIGH (ref 15–41)
Albumin: 2.3 g/dL — ABNORMAL LOW (ref 3.5–5.0)
Alkaline Phosphatase: 236 U/L — ABNORMAL HIGH (ref 38–126)
Anion gap: 20 — ABNORMAL HIGH (ref 5–15)
BUN: 148 mg/dL — ABNORMAL HIGH (ref 8–23)
CO2: 17 mmol/L — ABNORMAL LOW (ref 22–32)
Calcium: 8.2 mg/dL — ABNORMAL LOW (ref 8.9–10.3)
Chloride: 101 mmol/L (ref 98–111)
Creatinine, Ser: 5.99 mg/dL — ABNORMAL HIGH (ref 0.44–1.00)
GFR, Estimated: 7 mL/min — ABNORMAL LOW (ref >60)
Glucose, Bld: 392 mg/dL — ABNORMAL HIGH (ref 70–99)
Potassium: 3.3 mmol/L — ABNORMAL LOW (ref 3.5–5.1)
Sodium: 138 mmol/L (ref 135–145)
Total Bilirubin: 1.7 mg/dL — ABNORMAL HIGH (ref 0.3–1.2)
Total Protein: 5.6 g/dL — ABNORMAL LOW (ref 6.5–8.1)

## 2021-11-11 LAB — LACTATE DEHYDROGENASE: LDH: 1478 U/L — ABNORMAL HIGH (ref 98–192)

## 2021-11-11 LAB — RENAL FUNCTION PANEL
Albumin: 2.6 g/dL — ABNORMAL LOW (ref 3.5–5.0)
Anion gap: 11 (ref 5–15)
BUN: 107 mg/dL — ABNORMAL HIGH (ref 8–23)
CO2: 20 mmol/L — ABNORMAL LOW (ref 22–32)
Calcium: 8.1 mg/dL — ABNORMAL LOW (ref 8.9–10.3)
Chloride: 103 mmol/L (ref 98–111)
Creatinine, Ser: 4.22 mg/dL — ABNORMAL HIGH (ref 0.44–1.00)
GFR, Estimated: 11 mL/min — ABNORMAL LOW (ref >60)
Glucose, Bld: 154 mg/dL — ABNORMAL HIGH (ref 70–99)
Phosphorus: 3.2 mg/dL (ref 2.5–4.6)
Potassium: 3.4 mmol/L — ABNORMAL LOW (ref 3.5–5.1)
Sodium: 134 mmol/L — ABNORMAL LOW (ref 135–145)

## 2021-11-11 LAB — APTT
aPTT: 68 seconds — ABNORMAL HIGH (ref 24–36)
aPTT: 52 seconds — ABNORMAL HIGH (ref 24–36)

## 2021-11-11 LAB — MAGNESIUM: Magnesium: 1.8 mg/dL (ref 1.7–2.4)

## 2021-11-11 LAB — TROPONIN I (HIGH SENSITIVITY)
Troponin I (High Sensitivity): 210 ng/L (ref <18)
Troponin I (High Sensitivity): 194 ng/L (ref <18)

## 2021-11-11 LAB — PHOSPHORUS: Phosphorus: 5.3 mg/dL — ABNORMAL HIGH (ref 2.5–4.6)

## 2021-11-11 MED ORDER — NOREPINEPHRINE 4 MG/250ML-% IV SOLN: 2.0000 ug/min | INTRAVENOUS | Status: DC

## 2021-11-11 MED ORDER — VANCOMYCIN HCL 10 G IV SOLR
1600.0000 mg | Freq: Once | INTRAVENOUS | Status: AC
  Administered 2021-11-11: 1600 mg via INTRAVENOUS
  Filled 2021-11-11: qty 16

## 2021-11-11 MED ORDER — DEXMEDETOMIDINE HCL IN NACL 400 MCG/100ML IV SOLN
0.2000 ug/kg/h | INTRAVENOUS | Status: DC
  Administered 2021-11-11: 0.4 ug/kg/h via INTRAVENOUS
  Filled 2021-11-11: qty 100

## 2021-11-11 MED ORDER — NOREPINEPHRINE 4 MG/250ML-% IV SOLN
0.0000 ug/min | INTRAVENOUS | Status: DC
  Administered 2021-11-11: 2 ug/min via INTRAVENOUS
  Administered 2021-11-12: 4 ug/min via INTRAVENOUS
  Administered 2021-11-13: 8 ug/min via INTRAVENOUS
  Administered 2021-11-13 – 2021-11-14 (×2): 4 ug/min via INTRAVENOUS
  Filled 2021-11-11 (×5): qty 250

## 2021-11-11 MED ORDER — SODIUM CHLORIDE 0.9 % IV SOLN
1.0000 g | INTRAVENOUS | Status: DC
  Filled 2021-11-11: qty 10

## 2021-11-11 MED ORDER — PRISMASOL BGK 4/2.5 32-4-2.5 MEQ/L REPLACEMENT SOLN: Status: DC

## 2021-11-11 MED ORDER — TRACE MINERALS CU-MN-SE-ZN 300-55-60-3000 MCG/ML IV SOLN
INTRAVENOUS | Status: AC
  Filled 2021-11-11: qty 806.4

## 2021-11-11 MED ORDER — SODIUM CHLORIDE 0.9 % IV SOLN
2.0000 g | INTRAVENOUS | Status: DC
  Administered 2021-11-11 – 2021-11-15 (×5): 2 g via INTRAVENOUS
  Filled 2021-11-11 (×5): qty 20

## 2021-11-11 MED ORDER — MAGNESIUM SULFATE IN D5W 1-5 GM/100ML-% IV SOLN
1.0000 g | Freq: Once | INTRAVENOUS | Status: AC
  Administered 2021-11-11: 1 g via INTRAVENOUS
  Filled 2021-11-11: qty 100

## 2021-11-11 MED ORDER — SODIUM CHLORIDE 0.9 % IV SOLN: 250.0000 mL | INTRAVENOUS | Status: DC

## 2021-11-11 MED ORDER — FENTANYL CITRATE PF 50 MCG/ML IJ SOSY
25.0000 ug | PREFILLED_SYRINGE | Freq: Once | INTRAMUSCULAR | Status: AC
  Administered 2021-11-11: 25 ug via INTRAVENOUS
  Filled 2021-11-11: qty 1

## 2021-11-11 MED ORDER — INSULIN REGULAR(HUMAN) IN NACL 100-0.9 UT/100ML-% IV SOLN
INTRAVENOUS | Status: AC
  Administered 2021-11-11: 02:00:00 12 [IU]/h via INTRAVENOUS
  Administered 2021-11-11: 12:00:00 8.5 [IU]/h via INTRAVENOUS
  Administered 2021-11-12: 4 [IU]/h via INTRAVENOUS
  Administered 2021-11-12: 23:00:00 6.5 [IU]/h via INTRAVENOUS
  Administered 2021-11-13: 7.5 [IU]/h via INTRAVENOUS
  Administered 2021-11-14: 13:00:00 16 [IU]/h via INTRAVENOUS
  Filled 2021-11-11 (×7): qty 100

## 2021-11-11 MED ORDER — PRISMASOL BGK 4/2.5 32-4-2.5 MEQ/L EC SOLN: Status: DC

## 2021-11-11 MED ORDER — POTASSIUM CHLORIDE 10 MEQ/50ML IV SOLN
10.0000 meq | INTRAVENOUS | Status: AC
  Administered 2021-11-11 (×2): 10 meq via INTRAVENOUS
  Filled 2021-11-11 (×2): qty 50

## 2021-11-11 MED ORDER — DEXTROSE 50 % IV SOLN
0.0000 mL | INTRAVENOUS | Status: DC | PRN
  Administered 2021-11-17: 35 mL via INTRAVENOUS
  Administered 2021-11-20: 06:00:00 50 mL via INTRAVENOUS
  Administered 2021-11-20: 05:00:00 25 mL via INTRAVENOUS
  Filled 2021-11-11 (×3): qty 50

## 2021-11-11 MED ORDER — HEPARIN SODIUM (PORCINE) 1000 UNIT/ML DIALYSIS
1000.0000 [IU] | INTRAMUSCULAR | Status: DC | PRN
  Administered 2021-11-23: 3000 [IU] via INTRAVENOUS_CENTRAL
  Filled 2021-11-11 (×2): qty 6
  Filled 2021-11-11: qty 3

## 2021-11-11 MED ORDER — SODIUM CHLORIDE 0.9 % IV SOLN
0.1500 mg/kg/h | INTRAVENOUS | Status: DC
  Administered 2021-11-11 – 2021-11-13 (×2): 0.09 mg/kg/h via INTRAVENOUS
  Filled 2021-11-11 (×3): qty 250

## 2021-11-11 MED ORDER — VANCOMYCIN HCL IN DEXTROSE 1-5 GM/200ML-% IV SOLN
1000.0000 mg | INTRAVENOUS | Status: DC
  Administered 2021-11-12 – 2021-11-14 (×3): 1000 mg via INTRAVENOUS
  Filled 2021-11-11 (×3): qty 200

## 2021-11-11 MED ORDER — METOPROLOL TARTRATE 5 MG/5ML IV SOLN
2.5000 mg | INTRAVENOUS | Status: DC | PRN
  Filled 2021-11-11: qty 5

## 2021-11-11 MED ORDER — SODIUM CHLORIDE 0.9 % FOR CRRT: INTRAVENOUS_CENTRAL | Status: DC | PRN

## 2021-11-11 MED ORDER — SODIUM CHLORIDE 3 % IN NEBU
4.0000 mL | INHALATION_SOLUTION | Freq: Three times a day (TID) | RESPIRATORY_TRACT | Status: AC
Start: 2021-11-11 — End: 2021-11-14
  Administered 2021-11-11 – 2021-11-14 (×9): 4 mL via RESPIRATORY_TRACT
  Filled 2021-11-11 (×9): qty 4

## 2021-11-11 MED ORDER — SODIUM CHLORIDE 0.9 % IV SOLN: INTRAVENOUS | Status: DC | PRN

## 2021-11-11 NOTE — Progress Notes (Signed)
Pharmacy Antibiotic Note  [name] is a 71 y.o. female admitted on 10/28/2021 with PNA.  Pharmacy has been consulted for vancomycin dosing.  Overnight patient developed severe leukocytosis and respiratory distress concerning for aspiration PNA. Patient to begin CRRT as HD catheter not working and had sub-optimal HD for 2 hours yesterday. Patient has also had periods of low BP.   PCR pending. Discussed with MD and he will order new cultures. RN aware to draw cultures before ABX initiated.   Monistat cream for vaginal itching/irritation, happens whenever she gets ABX  Plan: Vancomycin 1600 mg x1 then 1 g q24h Ceftriaxone 2g q24h  Dose adjust ABX for CRRT Continue to monitor clinical signs of infection, culture data, levels as appropriate   Height: 5\' 5"  (165.1 cm) Weight: 80.2 kg (176 lb 12.9 oz) IBW/kg (Calculated) : 57  Temp (24hrs), Avg:98.4 F (36.9 C), Min:97.5 F (36.4 C), Max:98.8 F (37.1 C)  Recent Labs  Lab 11/08/21 0420 11/09/21 0458 11/09/21 1445 11/10/21 0459 11/10/21 2257 11/11/21 0500  WBC 23.8* 21.5* 26.6* 23.8*  --  38.6*  CREATININE 3.65*   3.56* 4.20*  --  5.72* 6.85* 5.99*    Estimated Creatinine Clearance: 8.9 mL/min (A) (by C-G formula based on SCr of 5.99 mg/dL (H)).    Allergies  Allergen Reactions   Aspirin Nausea And Vomiting    Other reaction(s): Unknown   Benadryl [Diphenhydramine]     Can only take dye free. States the dye causes itching.   Darvon [Propoxyphene] Itching    Can only during the day. Night time makes her itch   Hydrocodone Bit-Homatrop Mbr Itching   Metformin     Other reaction(s): GI   Other     Other reaction(s): Unknown. States reaction was to nasal spray that caused pupils to shrink   Pentazocine     nervous Other reaction(s): jitters   Percocet [Oxycodone-Acetaminophen]     itching   Sulfa Antibiotics Hives   Tetracaine Hcl     Other reaction(s): Unknown. Thinks caused itching.   Tetracyclines & Related  Hives   Tramadol     Other reaction(s): itch    Antimicrobials this admission: Vanc 1/1 >> 1/10, 1/15>>  Meropenem 1/8 >> 1/11 (D/t thrombocytopenia, d/c'ed meropenem without full course) Cefepime 1/1 >>1/8 Flagyl 1/4 >>1/8 Ceftriaxone 1/15>>   Dose adjustments this admission: None  Microbiology results: 1/3 BCx: NGF 1/8: Bcx: NGF 1/3 Sputum: rare GPCs, few candida albicans  1/8 Sputum: rare yeast 1/1 MRSA PCR: negative 1/9 MSRA PCR: negative  Thank you for allowing pharmacy to be a part of this patients care.  Anderson Malta A Jamis Kryder 11/11/2021 9:15 AM

## 2021-11-11 NOTE — Progress Notes (Signed)
Inpatient Rehab Admissions Coordinator Note:   Per PT patient was screened for CIR candidacy by Fleurette Woolbright Danford Bad, CCC-SLP. At this time, pt is not medically ready for CIR. Nephrology has recommended that pt restart CRRT. AC will not pursue a rehab consult for this pt at this time. CIR admissions team will follow from a distance to monitor for medical workup and progress with therapies.  Gayland Curry, [place], [place] Admissions Coordinator [phone] 11/11/21 12:49 PM

## 2021-11-11 NOTE — Progress Notes (Signed)
ANTICOAGULATION CONSULT NOTE  Pharmacy Consult for bivalirudin Indication: pulmonary embolus  Allergies  Allergen Reactions   Aspirin Nausea And Vomiting    Other reaction(s): Unknown   Benadryl [Diphenhydramine]     Can only take dye free. States the dye causes itching.   Darvon [Propoxyphene] Itching    Can only during the day. Night time makes her itch   Hydrocodone Bit-Homatrop Mbr Itching   Metformin     Other reaction(s): GI   Other     Other reaction(s): Unknown. States reaction was to nasal spray that caused pupils to shrink   Pentazocine     nervous Other reaction(s): jitters   Percocet [Oxycodone-Acetaminophen]     itching   Sulfa Antibiotics Hives   Tetracaine Hcl     Other reaction(s): Unknown. Thinks caused itching.   Tetracyclines & Related Hives   Tramadol     Other reaction(s): itch    Patient Measurements: Height: 5\' 5"  (165.1 cm) Weight: 80.2 kg (176 lb 12.9 oz) IBW/kg (Calculated) : 57  Vital Signs: Temp: 97.5 F (36.4 C) (01/15 0728) Temp Source: Oral (01/15 0728) BP: 174/67 (01/15 0700) Pulse Rate: 121 (01/15 0700)  Labs: Recent Labs    11/09/21 1445 11/09/21 1950 11/10/21 0459 11/10/21 1200 11/10/21 1828 11/10/21 2239 11/10/21 2257 11/11/21 0209 11/11/21 0500  HGB 7.0*  --  8.5*  --   --  8.2*  --   --  9.3*  HCT 21.1*  --  25.8*  --   --  24.0*  --   --  26.8*  PLT 9*  --  14*  --   --   --   --   --  15*  APTT  --    < > 41* 42* 51*  --   --   --  68*  CREATININE  --   --  5.72*  --   --   --  6.85*  --  5.99*  TROPONINIHS  --   --   --   --   --   --   --  210* 194*   < > = values in this interval not displayed.     Estimated Creatinine Clearance: 8.9 mL/min (A) (by C-G formula based on SCr of 5.99 mg/dL (H)).   Assessment: 71 yo female with PE s/p thrombectomy, possible HIT, on bivalirudin. aPTT today therapeutic at 68 on 0.09 mg/kg bilvalirudin. 2nd consecutive therapeutic aPTT. Will move to q12h checks due to renal  function. Hemoglobin trending up from 8.5 to 9.3. Platelets still low, but stable at 15. RN reports minor bleeding from nose due to NG suction. Will monitor.  Goal of Therapy:  PTT goal 50-85 sec Monitor platelets by anticoagulation protocol: Yes   Plan:  Continue bivalirudin 0.09 mg/kg/hr aPTT 5AM and 5PM Daily CBC while on bivalirudin  Cathrine Muster, PharmD PGY2 Cardiology Pharmacy Resident Phone: [phone] 11/11/2021  7:55 AM  Please check [email or web address] for unit-specific pharmacy phone numbers.

## 2021-11-11 NOTE — Progress Notes (Signed)
eLink Physician-Brief Progress Note Patient Name: KRYSTYL CANNELL DOB: [birth year] MRN: [id number]   Date of Service  11/11/2021  HPI/Events of Note  Hyperglycemia - Blood glucose = 498. Currently on Levemir and Novolog SSI.   eICU Interventions  Plan: D/C Levemir and Novolog SSI. Insulin IV infusion per [facility] protocol.      Intervention Category Major Interventions: Hyperglycemia - active titration of insulin therapy  Abraham Entwistle Eugene 11/11/2021, 1:04 AM

## 2021-11-11 NOTE — Progress Notes (Addendum)
S: PCCM ground team called to bedside to evaluate for desaturation on [place]. Patient is anxious and not tolerating BiPAP. Also, patient is having issues with HD catheter during dialysis. Dialysis catheter appears to be somewhat positional, but dialysis nurse states that he should be able to continue iHD.  O: Today's Vitals   11/10/21 2300 11/11/21 0000 11/11/21 0100 11/11/21 0200  BP: (!) 200/102 (!) 141/75 (!) 147/95 (!) 134/91  Pulse: (!) 118 96 (!) 130 (!) 126  Resp: (!) 21 16 19  (!) 24  Temp:      TempSrc:      SpO2: 97% 96% 93% 93%  Weight:      Height:      PainSc:       Body mass index is 28.84 kg/m.  General:  anxious appearing on [place] HEENT: MM pink/moist; [place] in place Neuro: Aox3; MAE CV: s1s2, tachy 120s, no m/r/g PULM:  dim crackles BS bilaterally; [place] 8 L sats 100% GI: soft, bsx4 active  Extremities: warm/dry, BLE edema  Skin: no rashes or lesions appreciated  A Acute respiratory failure with hypoxia Anxiety Acute renal failure  P: -contiue iHD for volume removal -will order levo for dialysis -may need new HD catheter for next treatment -precedex for anxiety -attempt to place on bipap after precedex on board -if unable to tolerate bipap, consider heated HFNC   JD Geryl [facility] 11/11/2021, 2:46 AM  Please see [email or web address] for pager details.  From 7A-7P if no response, please call [phone]. After hours, please call ELink [phone].
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
## 2021-11-11 NOTE — Progress Notes (Signed)
ANTICOAGULATION CONSULT NOTE  Pharmacy Consult for bivalirudin Indication: pulmonary embolus  Allergies  Allergen Reactions   Aspirin Nausea And Vomiting    Other reaction(s): Unknown   Benadryl [Diphenhydramine]     Can only take dye free. States the dye causes itching.   Darvon [Propoxyphene] Itching    Can only during the day. Night time makes her itch   Hydrocodone Bit-Homatrop Mbr Itching   Metformin     Other reaction(s): GI   Other     Other reaction(s): Unknown. States reaction was to nasal spray that caused pupils to shrink   Pentazocine     nervous Other reaction(s): jitters   Percocet [Oxycodone-Acetaminophen]     itching   Sulfa Antibiotics Hives   Tetracaine Hcl     Other reaction(s): Unknown. Thinks caused itching.   Tetracyclines & Related Hives   Tramadol     Other reaction(s): itch    Patient Measurements: Height: 5\' 5"  (165.1 cm) Weight: 80.2 kg (176 lb 12.9 oz) IBW/kg (Calculated) : 57  Vital Signs: Temp: 97.5 F (36.4 C) (01/15 1101) Temp Source: Oral (01/15 0728) BP: 118/71 (01/15 1800) Pulse Rate: 97 (01/15 1800)  Labs: Recent Labs    11/09/21 1445 11/09/21 1950 11/10/21 0459 11/10/21 1200 11/10/21 1828 11/10/21 2239 11/10/21 2257 11/11/21 0209 11/11/21 0500 11/11/21 1708  HGB 7.0*  --  8.5*  --   --  8.2*  --   --  9.3*  --   HCT 21.1*  --  25.8*  --   --  24.0*  --   --  26.8*  --   PLT 9*  --  14*  --   --   --   --   --  15*  --   APTT  --    < > 41*   < > 51*  --   --   --  68* 52*  CREATININE  --   --  5.72*  --   --   --  6.85*  --  5.99* 4.22*  TROPONINIHS  --   --   --   --   --   --   --  210* 194*  --    < > = values in this interval not displayed.     Estimated Creatinine Clearance: 12.6 mL/min (A) (by C-G formula based on SCr of 4.22 mg/dL (H)).   Assessment: 71 yo female with PE s/p thrombectomy, possible HIT, for bivalirudin. aPTT today subtherapeutic at 42 on 0.07 mg/kg bilvalirudin. Hemoglobin improved to 8.5  and plates improved from 8 to 14. No bleeding per RN.  Repeat aPTT is therapeutic this afternoon at 52 seconds.  Goal of Therapy:  PTT goal 50-85 sec Monitor platelets by anticoagulation protocol: Yes   Plan:  Continue bivalirudin 0.09 mg/kg/hr aPTT in am   Arrie Senate, PharmD, [place], [facility] Clinical Pharmacist [phone] Please check AMION for all [facility] Pharmacy numbers 11/11/2021

## 2021-11-11 NOTE — Progress Notes (Addendum)
eLink Physician-Brief Progress Note Patient Name: [name] DOB: 1950/11/24 MRN: [id number]   Date of Service  11/11/2021  HPI/Events of Note  Patient c/o chest pain and anxiety. Currently on HD, however, dialysis has only been able to remove about 500 mL d/t HD catheter issues. BP = 134/91.  eICU Interventions  Plan: Fentanyl 25 mcg IV X 1 now. 12 Lead EKG STAT. Cycle Troponin.      Intervention Category Major Interventions: Other:  Lysle Dingwall 11/11/2021, 1:53 AM

## 2021-11-11 NOTE — Progress Notes (Signed)
IP PROGRESS NOTE  Subjective:   Events noted overnight.  No major bleeding reported.  She did have slight oozing after oral suctioning.  She denies any hematochezia, melena or hemoptysis.  No evidence of bleeding around her catheter sites.  Objective:  Vital signs in last 24 hours: Temp:  [97.5 F (36.4 C)-98.8 F (37.1 C)] 97.5 F (36.4 C) (01/15 0728) Pulse Rate:  [82-137] 115 (01/15 0900) Resp:  [13-26] 22 (01/15 0900) BP: (90-200)/(50-111) 152/72 (01/15 0900) SpO2:  [88 %-98 %] 88 % (01/15 0900) Arterial Line BP: (138-179)/(37-58) 157/42 (01/14 1600) Weight:  [176 lb 12.9 oz (80.2 kg)] 176 lb 12.9 oz (80.2 kg) (01/15 0500) Weight change: 3 lb 8.4 oz (1.6 kg) Last BM Date: 11/11/21  Intake/Output from previous day: 01/14 0701 - 01/15 0700 In: 2138.6 [I.V.:2138.6] Out: 1600  General: Alert, awake without distress. Head: Normocephalic atraumatic. Mouth: mucous membranes showed dried blood without any active bleeding. Eyes: No scleral icterus.  Pupils are equal and round reactive to light. GI: soft, non-tender; bowel sounds normal; no masses,  no organomegaly Musculoskeletal: No joint deformity or effusion. Neurological: No motor, sensory deficits.  Intact deep tendon reflexes. Skin: No petechia or ecchymosis.   Lab Results: Recent Labs    11/10/21 0459 11/10/21 2239 11/11/21 0500  WBC 23.8*  --  38.6*  HGB 8.5* 8.2* 9.3*  HCT 25.8* 24.0* 26.8*  PLT 14*  --  15*    BMET Recent Labs    11/10/21 2257 11/11/21 0500  NA 130* 138  K 3.7 3.3*  CL 98 101  CO2 17* 17*  GLUCOSE 571* 392*  BUN 167* 148*  CREATININE 6.85* 5.99*  CALCIUM 7.5* 8.2*    Studies/Results: DG CHEST PORT 1 VIEW  Result Date: 11/10/2021 CLINICAL DATA:  Hypoxia EXAM: PORTABLE CHEST 1 VIEW COMPARISON:  11/01/2021 FINDINGS: Single frontal view of the chest demonstrates interval removal of the endotracheal and enteric catheters. Right internal jugular catheter unchanged. Cardiac silhouette  is stable. Chronic elevation of the right hemidiaphragm. Improved aeration at the lung bases, with minimal residual atelectasis. Mild central vascular congestion. No effusion or pneumothorax. IMPRESSION: 1. Low lung volumes, with improved aeration at the lung bases. 2. Central vascular congestion without overt edema. Electronically Signed   By: Randa Ngo M.D.   On: 11/10/2021 22:09    Medications: I have reviewed the patient's current medications.  Assessment/Plan:  71 year old with:  1.  Thrombocytopenia appears to be multifactorial in nature.  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  HIT is also a consideration.  Laboratory data showed adequate platelet count of 15 and no active bleeding noted.  She did respond to platelet transfusion.  She did receive Nplate as well as dexamethasone.  At this time, I do not recommend any additional steroids, IVIG or Nplate and continue to monitor her counts.  Transfuse platelets if active bleeding is noted.  Her last transfusion was 11/09/2021.  2.  Anemia: Related to element of microangiopathy or multiorgan dysfunction.  Her hemoglobin is improving currently.  LDH is slightly elevated.  Her bilirubin is mildly elevated which does not support ongoing microangiopathy at this time.  3.  Renal failure: She is currently receiving dialysis.  Related to her cardiac arrest and low perfusion state.  4.  Respiratory failure: Currently extubated and respiratory status appears to be stabilizing.   We will continue to follow her progress and her labs during her hospitalization.   35  minutes were dedicated  to this visit.  50% of the time was face-to-face with the patient and her daughter.  The time was spent on reviewing laboratory data, discussing treatment options, discussing differential diagnosis and answering questions regarding future plan.     LOS: 14 days   Zola Button 11/11/2021, 10:33  AM

## 2021-11-11 NOTE — Progress Notes (Signed)
PHARMACY - TOTAL PARENTERAL NUTRITION CONSULT NOTE  Indication:  Intolerance of enteral feeding and unable to obtain enteral access  Patient Measurements: Height: 5\' 5"  (165.1 cm) Weight: 80.2 kg (176 lb 12.9 oz) IBW/kg (Calculated) : 57   Body mass index is 29.42 kg/m.  Assessment:  27 YOF who was admitted 10/28/21 after cardiac arrest and out of hospital CPR due to large saddle PE. Patient has been intubated and course complicated by uGIB and significantly low platelets, acute hepatic and renal failure.  Unable to place Cortrak due to thrombocytopenia, so Pharmacy consulted to manage TPN.   Glucose / Insulin: hx DM possibly on Lantus 22/d and Prandin 2mg  BID PTA Decadron 20mg  daily ended 1/4 Switched to vsSSI and Levemir 24 units BID briefly 1/14 PM, back on insulin gtt CBGs down to 200s Electrolytes: K down to 3.3, low CO2, Phos high at 5.3 (none in TPN), others WNL (Mag down to WNL) Renal: SCr up to 5.99 (BL SCr ~1.1).  CRRT >> iHD, last session 1/14 (2.5 hrs d/t poor access flow and clotting) Hepatic: LFTs elevated and trending down (elevated prior to TPN initiation), tbili down to 1.7 (no jaundice). TG WNL, albumin 2.3 Intake / Output; MIVF: net +1.8L, LBM 1/12 GI Imaging: none since start of TPN GI Surgeries / Procedures: none since start of TPN  Central access: Triple lumen CVC placed 10/28/21 (confirmed with RN dedicated lumen available; confirmed with CCM ok to use line for TPN despite age of line; unable to place new line due to AKI and platelets) TPN start date: 11/08/21  Nutritional Goals: RD Estimated Needs Total Energy Estimated Needs: 1950-2150 kcals Total Protein Estimated Needs: 100-125 g Total Fluid Estimated Needs: >/= 1.8 L  Current Nutrition:  TPN  Plan:  Continue concentrated TPN at goal rate 70 mL/hr to provide 121g AA, 252g CHO and 61g ILE for a total of 1953 kCal, meeting 100% of needs Electrolytes in TPN: increase Na to 106mEq/L on 1/14, add K 28mEq/L (=  51mEq/d), Ca 2.46mEq/L, add Mg 26mEq/L, Phos 51mmol/L, max acetate Add standard MVI and trace elements to TPN - remove chromium with HD needs Continue insulin drip per primary team; last dose of Decadron on 1/14 Mag sulfate 1gm IV x 1 KCL x 2 runs Standard TPN labs on Mon/Thurs Monitor platelets and ability to have tube placed for enteral feeding  Kinzlee Selvy D. Mina Marble, PharmD, BCPS, [place] 11/11/2021, 9:37 AM

## 2021-11-11 NOTE — Progress Notes (Signed)
[facility] Progress Note     Assessment/ Plan:   Renal failure secondary to ATN in the setting of cardiac arrest, hypotension, pressors, contrast.  BL CKD3A with BL creatinine in the 1.17-1.3 range in late 2021. - R fem HD cath - CRRT 10/31/20 - changed to all 4K bath1/9/23-1/11/23 -HD 11/08/21--> had to have pressor with it - HD treatment 11/10/20--> cathether issues- wonder if BFR wasn't achieved--> BUN up, breathing worse, + fluid balance again - since BFR with CRRT is lower, may be more successful- volume and potential uremia biggest issue, needs to go back on CRRT, have discussed with PCCM and primary RN - restart today 11/11/21   Prolonged VF cardiac arrest--> OOH VF arrest 2/2 massive PE  Massive PE s/p mechanical thrombectomy 1/2 of R and L PA.  Vasc US- no DVT, liver mass noted 10/28/21 CT abd/ pelvis, s/p MRI abd--> incompletely characterized  Retroperitoneal bleed/Hemorrhagic shock/Acute blood loss anemia originating from left liver lobe s/p embolization 1/4--> required FFP, plts, Vit K, pRBCs  Thrombocytopenia- component of DIC suspected, HIT negative, heparin --> bivalirudin, getting platelet transfusions, heme involved, suspects ITP, getting Nplate and dex  DM  HTN  Acute hypoxic RF- extubated 11/07/21  Sepsis/ leukocytosis/ aspiration pneumonia- on meropenem, WBC ct increasing to 25.1, cultures negative so far--> MRI demonstrated aspiration pneumonia  FEN: on TPN--> increased obligate intake  Dispo: ICU  Subjective:    HD yesterday- had only 2 hr 15 min of rx d/t catheter issues- unclear what they were via note.  BUN up today, breathing not great, getting vol overloaded again.      Objective:   BP (!) 152/72    Pulse (!) 115    Temp (!) 97.5 F (36.4 C) (Oral)    Resp (!) 22    Ht 5\' 5"  (1.651 m)    Wt 80.2 kg    SpO2 (!) 88%    BMI 29.42 kg/m   Intake/Output Summary (Last 24 hours) at 11/11/2021 1016 Last data filed at 11/11/2021 0900 Gross per 24  hour  Intake 1987.64 ml  Output 1600 ml  Net 387.64 ml   Weight change: 1.6 kg  Physical Exam: GEN: NAD, sitting up in bed HEENT:  sclerae anicteric NECK: Supple, no thyromegaly LUNGS: decreased air movement bilaterally, some crackles, increased WOB CV: Tachy ABD: soft, nontender EXT: 1+ LE edema, in heel protectors, UE edema, worsened ACCESS: L fem nontunneled HD cath   Imaging: DG CHEST PORT 1 VIEW  Result Date: 11/10/2021 CLINICAL DATA:  Hypoxia EXAM: PORTABLE CHEST 1 VIEW COMPARISON:  11/01/2021 FINDINGS: Single frontal view of the chest demonstrates interval removal of the endotracheal and enteric catheters. Right internal jugular catheter unchanged. Cardiac silhouette is stable. Chronic elevation of the right hemidiaphragm. Improved aeration at the lung bases, with minimal residual atelectasis. Mild central vascular congestion. No effusion or pneumothorax. IMPRESSION: 1. Low lung volumes, with improved aeration at the lung bases. 2. Central vascular congestion without overt edema. Electronically Signed   By: Randa Ngo M.D.   On: 11/10/2021 22:09    Labs: BMET Recent Labs  Lab 11/06/21 0356 11/06/21 1615 11/07/21 0416 11/07/21 1523 11/08/21 0420 11/08/21 1335 11/09/21 0458 11/10/21 0459 11/10/21 2239 11/10/21 2257 11/11/21 0500  NA 136 134* 136 134* 140   136 141 138 139 142 130* 138  K 3.6 3.3* 3.8 4.0 4.5   4.5 4.1 3.9 3.7 2.7* 3.7 3.3*  CL 102 103 105 100 101   103  --  102 103  --  98 101  CO2 19* 21* 21* 19* 21*   20*  --  21* 20*  --  17* 17*  GLUCOSE 236* 268* 143* 224* 254*   261*  --  240* 224*  --  571* 392*  BUN 40* 44* 40* 42* 69*   70*  --  83* 131*  --  167* 148*  CREATININE 2.19* 2.13* 1.93* 2.22* 3.65*   3.56*  --  4.20* 5.72*  --  6.85* 5.99*  CALCIUM 7.5* 7.5* 7.8* 7.9* 8.2*   7.8*  --  8.2* 8.1*  --  7.5* 8.2*  PHOS 2.6 2.7 2.7 4.0 6.1*  --  5.1*  --   --   --  5.3*   CBC Recent Labs  Lab 11/09/21 0458 11/09/21 1445 11/10/21 0459  11/10/21 2239 11/11/21 0500  WBC 21.5* 26.6* 23.8*  --  38.6*  HGB 7.3* 7.0* 8.5* 8.2* 9.3*  HCT 21.5* 21.1* 25.8* 24.0* 26.8*  MCV 88.5 89.0 86.3  --  85.6  PLT 8* 9* 14*  --  15*    Medications:     chlorhexidine  15 mL Mouth Rinse BID   Chlorhexidine Gluconate Cloth  6 each Topical Daily   mouth rinse  15 mL Mouth Rinse BID   mouth rinse  15 mL Mouth Rinse q12n4p   pantoprazole (PROTONIX) IV  40 mg Intravenous Q1400   sodium chloride HYPERTONIC  4 mL Nebulization TID      Madelon Lips, MD 11/11/2021, 10:16 AM

## 2021-11-11 NOTE — Progress Notes (Signed)
HD tx not achieved as expected due to poor access flow and clotting. Has received 2.25 hours  treatment.

## 2021-11-11 NOTE — Progress Notes (Signed)
Generated pharmacy IV request for vasopressors. Pt has a central line. No US guided IV needed at this time per primary RN.

## 2021-11-12 ENCOUNTER — Encounter (HOSPITAL_COMMUNITY): Payer: Self-pay | Admitting: Critical Care Medicine

## 2021-11-12 ENCOUNTER — Inpatient Hospital Stay (HOSPITAL_COMMUNITY): Payer: Medicare PPO

## 2021-11-12 DIAGNOSIS — D72829 Elevated white blood cell count, unspecified: Secondary | ICD-10-CM

## 2021-11-12 DIAGNOSIS — I953 Hypotension of hemodialysis: Secondary | ICD-10-CM

## 2021-11-12 DIAGNOSIS — J690 Pneumonitis due to inhalation of food and vomit: Secondary | ICD-10-CM

## 2021-11-12 DIAGNOSIS — D696 Thrombocytopenia, unspecified: Secondary | ICD-10-CM

## 2021-11-12 DIAGNOSIS — N179 Acute kidney failure, unspecified: Secondary | ICD-10-CM

## 2021-11-12 DIAGNOSIS — N189 Chronic kidney disease, unspecified: Secondary | ICD-10-CM

## 2021-11-12 LAB — GLUCOSE, CAPILLARY
Glucose-Capillary: 132 mg/dL — ABNORMAL HIGH (ref 70–99)
Glucose-Capillary: 137 mg/dL — ABNORMAL HIGH (ref 70–99)
Glucose-Capillary: 138 mg/dL — ABNORMAL HIGH (ref 70–99)
Glucose-Capillary: 143 mg/dL — ABNORMAL HIGH (ref 70–99)
Glucose-Capillary: 149 mg/dL — ABNORMAL HIGH (ref 70–99)
Glucose-Capillary: 154 mg/dL — ABNORMAL HIGH (ref 70–99)
Glucose-Capillary: 155 mg/dL — ABNORMAL HIGH (ref 70–99)
Glucose-Capillary: 169 mg/dL — ABNORMAL HIGH (ref 70–99)
Glucose-Capillary: 172 mg/dL — ABNORMAL HIGH (ref 70–99)
Glucose-Capillary: 192 mg/dL — ABNORMAL HIGH (ref 70–99)
Glucose-Capillary: 195 mg/dL — ABNORMAL HIGH (ref 70–99)
Glucose-Capillary: 203 mg/dL — ABNORMAL HIGH (ref 70–99)
Glucose-Capillary: 205 mg/dL — ABNORMAL HIGH (ref 70–99)
Glucose-Capillary: 205 mg/dL — ABNORMAL HIGH (ref 70–99)
Glucose-Capillary: 219 mg/dL — ABNORMAL HIGH (ref 70–99)
Glucose-Capillary: 228 mg/dL — ABNORMAL HIGH (ref 70–99)
Glucose-Capillary: 231 mg/dL — ABNORMAL HIGH (ref 70–99)
Glucose-Capillary: 233 mg/dL — ABNORMAL HIGH (ref 70–99)
Glucose-Capillary: 248 mg/dL — ABNORMAL HIGH (ref 70–99)
Glucose-Capillary: 255 mg/dL — ABNORMAL HIGH (ref 70–99)
Glucose-Capillary: 88 mg/dL (ref 70–99)

## 2021-11-12 LAB — RENAL FUNCTION PANEL
Albumin: 2.7 g/dL — ABNORMAL LOW (ref 3.5–5.0)
Anion gap: 12 (ref 5–15)
BUN: 66 mg/dL — ABNORMAL HIGH (ref 8–23)
CO2: 23 mmol/L (ref 22–32)
Calcium: 8.5 mg/dL — ABNORMAL LOW (ref 8.9–10.3)
Chloride: 100 mmol/L (ref 98–111)
Creatinine, Ser: 2.25 mg/dL — ABNORMAL HIGH (ref 0.44–1.00)
GFR, Estimated: 23 mL/min — ABNORMAL LOW (ref >60)
Glucose, Bld: 231 mg/dL — ABNORMAL HIGH (ref 70–99)
Phosphorus: 1.8 mg/dL — ABNORMAL LOW (ref 2.5–4.6)
Potassium: 4.3 mmol/L (ref 3.5–5.1)
Sodium: 135 mmol/L (ref 135–145)

## 2021-11-12 LAB — APTT
aPTT: 49 seconds — ABNORMAL HIGH (ref 24–36)
aPTT: 53 seconds — ABNORMAL HIGH (ref 24–36)

## 2021-11-12 LAB — COMPREHENSIVE METABOLIC PANEL
ALT: 69 U/L — ABNORMAL HIGH (ref 0–44)
AST: 98 U/L — ABNORMAL HIGH (ref 15–41)
Albumin: 2.6 g/dL — ABNORMAL LOW (ref 3.5–5.0)
Alkaline Phosphatase: 221 U/L — ABNORMAL HIGH (ref 38–126)
Anion gap: 12 (ref 5–15)
BUN: 78 mg/dL — ABNORMAL HIGH (ref 8–23)
CO2: 23 mmol/L (ref 22–32)
Calcium: 8.5 mg/dL — ABNORMAL LOW (ref 8.9–10.3)
Chloride: 101 mmol/L (ref 98–111)
Creatinine, Ser: 2.83 mg/dL — ABNORMAL HIGH (ref 0.44–1.00)
GFR, Estimated: 17 mL/min — ABNORMAL LOW (ref >60)
Glucose, Bld: 152 mg/dL — ABNORMAL HIGH (ref 70–99)
Potassium: 3.5 mmol/L (ref 3.5–5.1)
Sodium: 136 mmol/L (ref 135–145)
Total Bilirubin: 1.9 mg/dL — ABNORMAL HIGH (ref 0.3–1.2)
Total Protein: 6.6 g/dL (ref 6.5–8.1)

## 2021-11-12 LAB — CBC
HCT: 29.7 % — ABNORMAL LOW (ref 36.0–46.0)
Hemoglobin: 10.2 g/dL — ABNORMAL LOW (ref 12.0–15.0)
MCH: 29.4 pg (ref 26.0–34.0)
MCHC: 34.3 g/dL (ref 30.0–36.0)
MCV: 85.6 fL (ref 80.0–100.0)
Platelets: 9 10*3/uL — CL (ref 150–400)
RBC: 3.47 MIL/uL — ABNORMAL LOW (ref 3.87–5.11)
RDW: 20.2 % — ABNORMAL HIGH (ref 11.5–15.5)
WBC: 27 10*3/uL — ABNORMAL HIGH (ref 4.0–10.5)
nRBC: 0.6 % — ABNORMAL HIGH (ref 0.0–0.2)

## 2021-11-12 LAB — DRVVT CONFIRM: dRVVT Confirm: >1.4 ratio — ABNORMAL HIGH (ref 0.8–1.2)

## 2021-11-12 LAB — PTT-LA MIX: PTT-LA Mix: 128 s — ABNORMAL HIGH (ref 0.0–48.9)

## 2021-11-12 LAB — LACTATE DEHYDROGENASE: LDH: 1463 U/L — ABNORMAL HIGH (ref 98–192)

## 2021-11-12 LAB — TRIGLYCERIDES: Triglycerides: 264 mg/dL — ABNORMAL HIGH (ref <150)

## 2021-11-12 LAB — LUPUS ANTICOAGULANT PANEL
DRVVT: >180 s — ABNORMAL HIGH (ref 0.0–47.0)
PTT Lupus Anticoagulant: >160 s — ABNORMAL HIGH (ref 0.0–51.9)

## 2021-11-12 LAB — DRVVT MIX: dRVVT Mix: 135.7 s — ABNORMAL HIGH (ref 0.0–40.4)

## 2021-11-12 LAB — PROCALCITONIN: Procalcitonin: 4.82 ng/mL

## 2021-11-12 LAB — PHOSPHORUS: Phosphorus: 2.1 mg/dL — ABNORMAL LOW (ref 2.5–4.6)

## 2021-11-12 LAB — MAGNESIUM: Magnesium: 2.3 mg/dL (ref 1.7–2.4)

## 2021-11-12 LAB — HEXAGONAL PHASE PHOSPHOLIPID: Hexagonal Phase Phospholipid: 13 s — ABNORMAL HIGH (ref 0–11)

## 2021-11-12 MED ORDER — ROMIPLOSTIM 250 MCG ~~LOC~~ SOLR
2.0000 ug/kg | Freq: Once | SUBCUTANEOUS | Status: AC
  Administered 2021-11-12: 150 ug via SUBCUTANEOUS
  Filled 2021-11-12: qty 0.3

## 2021-11-12 MED ORDER — ANTICOAGULANT SODIUM CITRATE 4% (200MG/5ML) IV SOLN
5.0000 mL | Freq: Every day | Status: DC | PRN
  Administered 2021-11-14 – 2021-11-17 (×2): 2.8 mL via INTRAVENOUS_CENTRAL
  Administered 2021-12-01: 5 mL via INTRAVENOUS_CENTRAL
  Filled 2021-11-12 (×2): qty 2.8
  Filled 2021-11-12 (×5): qty 5

## 2021-11-12 MED ORDER — SODIUM CHLORIDE 0.9 % IV SOLN
30.0000 mg | Freq: Every day | INTRAVENOUS | Status: DC
  Administered 2021-11-12 – 2021-11-14 (×3): 30 mg via INTRAVENOUS
  Filled 2021-11-12 (×4): qty 3

## 2021-11-12 MED ORDER — DEXAMETHASONE SODIUM PHOSPHATE 10 MG/ML IJ SOLN
30.0000 mg | INTRAMUSCULAR | Status: DC
  Filled 2021-11-12: qty 3

## 2021-11-12 MED ORDER — TRACE MINERALS CU-MN-SE-ZN 300-55-60-3000 MCG/ML IV SOLN
INTRAVENOUS | Status: AC
  Filled 2021-11-12: qty 806.4

## 2021-11-12 NOTE — Evaluation (Signed)
Occupational Therapy Evaluation Patient Details Name: [name] MRN: [id number] DOB: 09/20/1951 Today's Date: 11/12/2021   History of Present Illness 71 yo female presenting 1/1 after cardiac arrest requiring 20 min CPR for ROSC with pt intubated in the field prior to arrival. Pt had 2 additional codes in ED requiring CPR for 4 and 2 min respectively. Further work up revealed STEMI, presumed cardiogenic shock, large saddle PE, RUQ bleeding, and 2 rib fx. S/p PE thrombectomy 1/2, found to have intraperitoneal hemorrhage s/p embolization 1/5, extubated on 1/10. Restart CRRT on 1/15. PMH includes: fibromyalgia, HTN, DM II, CKD, GERD, and asthma.   Clinical Impression   PTA, pt was living alone and was independent. Pt currently requiring Max A for UB ADLs and Total A for LB ADLs. Session limited as pt restarting CRRT on 1/15. Despite limitations and fatigue, pt motivated to participate in grooming and exercises. Pt participating in UE, LB, and cervical ROM exercises. Pt presenting with decreased strength and activity tolerance. Pt would benefit from further acute OT to facilitate safe dc. Due to patient's motivation, PLOF, and good family support, recommend dc to AIR for further OT to optimize safety, independence with ADLs, and return to PLOF.      Recommendations for follow up therapy are one component of a multi-disciplinary discharge planning process, led by the attending physician.  Recommendations may be updated based on patient status, additional functional criteria and insurance authorization.   Follow Up Recommendations  Acute inpatient rehab (3hours/day)    Assistance Recommended at Discharge Frequent or constant Supervision/Assistance  Patient can return home with the following      Functional Status Assessment  Patient has had a recent decline in their functional status and demonstrates the ability to make significant improvements in function in a reasonable and predictable amount  of time.  Equipment Recommendations  Other (comment) (Defer to next venue)    Recommendations for Other Services Rehab consult     Precautions / Restrictions Precautions Precautions: Fall Precaution Comments: L femoral non-tunneled HD cath - back on CRRT 1/15. HFNC Restrictions Weight Bearing Restrictions: No      Mobility Bed Mobility               General bed mobility comments: Max A for repositioning in bed.    Transfers                   General transfer comment: Defer as pt back on CRRT 1/15      Balance                                           ADL either performed or assessed with clinical judgement   ADL Overall ADL's : Needs assistance/impaired Eating/Feeding: NPO   Grooming: Minimal assistance;Bed level   Upper Body Bathing: Maximal assistance;Bed level   Lower Body Bathing: Total assistance;Bed level   Upper Body Dressing : Maximal assistance;Bed level   Lower Body Dressing: Total assistance;Bed level                 General ADL Comments: Pt with decreased strength, balance, and activity toelrance. Limited session as pt back on CRRT. Bedlevel exercises and grooming. RN present throughout     Vision Baseline Vision/History: 1 Wears glasses       Perception     Praxis      Pertinent Vitals/Pain  Pain Assessment: No/denies pain     Hand Dominance Right   Extremity/Trunk Assessment Upper Extremity Assessment Upper Extremity Assessment: Generalized weakness   Lower Extremity Assessment Lower Extremity Assessment: Generalized weakness       Communication Communication Communication: Expressive difficulties   Cognition Arousal/Alertness: Awake/alert Behavior During Therapy: Flat affect Overall Cognitive Status: Impaired/Different from baseline Area of Impairment: Following commands                       Following Commands: Follows one step commands with increased time       General  Comments: Increased time for processing     General Comments  SpO2 90s on 10L via HFNC.    Exercises Exercises: General Upper Extremity;General Lower Extremity;Other exercises General Exercises - Upper Extremity Shoulder Flexion: AAROM;Supine;10 reps;Both (hold 5 sec at 90s flexion; rest break between) Elbow Flexion: AROM;Both;10 reps;Supine (x3) Elbow Extension: AROM;Both;10 reps;Supine (x3) General Exercises - Lower Extremity Ankle Circles/Pumps: AAROM;Right;Left;10 reps;Supine Other Exercises Other Exercises: cervical rotation; x5; hold for 5 sec; supine   Shoulder Instructions      Home Living Family/patient expects to be discharged to:: Private residence Living Arrangements: Alone Available Help at Discharge: Family;Available 24 hours/day Type of Home: Apartment Home Access: Stairs to enter Entrance Stairs-Number of Steps: 1 Entrance Stairs-Rails: None Home Layout: Able to live on main level with bedroom/bathroom;Multi-level;1/2 bath on main level;Bed/bath upstairs Alternate Level Stairs-Number of Steps: 6 stairs, landing, then 6 more Alternate Level Stairs-Rails: Left Bathroom Shower/Tub: Teacher, early years/pre: Handicapped height     Home Equipment: Shower seat;Cane - quad;BSC/3in1;Hand held shower head          Prior Functioning/Environment Prior Level of Function : Independent/Modified Independent;Driving             Mobility Comments: Going to [employer]. ADLs Comments: ADLs, IADLs, driving, and enjoys cooking        OT Problem List: Decreased strength;Decreased range of motion;Decreased activity tolerance;Decreased knowledge of use of DME or AE;Decreased knowledge of precautions      OT Treatment/Interventions: Self-care/ADL training;Therapeutic exercise;Energy conservation;DME and/or AE instruction;Therapeutic activities;Patient/family education    OT Goals(Current goals can be found in the care plan section) Acute Rehab OT  Goals Patient Stated Goal: Get stronger OT Goal Formulation: With patient Time For Goal Achievement: 11/26/21 Potential to Achieve Goals: Good  OT Frequency: Min 2X/week    Co-evaluation              AM-PAC OT "6 Clicks" Daily Activity     Outcome Measure Help from another person eating meals?: Total Help from another person taking care of personal grooming?: A Little Help from another person toileting, which includes using toliet, bedpan, or urinal?: A Lot Help from another person bathing (including washing, rinsing, drying)?: A Lot Help from another person to put on and taking off regular upper body clothing?: A Lot Help from another person to put on and taking off regular lower body clothing?: Total 6 Click Score: 11   End of Session Equipment Utilized During Treatment: Oxygen Nurse Communication: Mobility status  Activity Tolerance: Other (comment) (limited by CRRT placement) Patient left: in bed;with call bell/phone within reach;with nursing/sitter in room  OT Visit Diagnosis: Unsteadiness on feet (R26.81);Other abnormalities of gait and mobility (R26.89);Muscle weakness (generalized) (M62.81)                Time: 4098-1191 OT Time Calculation (min): 24 min Charges:  OT General Charges $OT Visit:  1 Visit OT Evaluation $OT Eval High Complexity: 1 High OT Treatments $Therapeutic Exercise: 8-22 mins  Arvis Miguez MSOT, OTR/L Acute Rehab Pager: [phone] Office: [place] 11/12/2021, 12:39 PM

## 2021-11-12 NOTE — Progress Notes (Signed)
PHARMACY - TOTAL PARENTERAL NUTRITION CONSULT NOTE  Indication:  Intolerance of enteral feeding and unable to obtain enteral access  Patient Measurements: Height: 5\' 5"  (165.1 cm) Weight: 75.4 kg (166 lb 3.6 oz) IBW/kg (Calculated) : 57   Body mass index is 27.66 kg/m.  Assessment:  86 YOF who was admitted 10/28/21 after cardiac arrest and out of hospital CPR due to large saddle PE. Patient has been intubated and course complicated by uGIB and significantly low platelets, acute hepatic and renal failure.  Unable to place Cortrak due to thrombocytopenia, so Pharmacy consulted to manage TPN.   Glucose / Insulin: hx DM possibly on Lantus 22/d and Prandin 2mg  BID PTA Decadron 20mg  daily ended 1/4 Switched to vsSSI and Levemir 24 units BID briefly 1/14 PM, back on insulin gtt CBGs down to 150s Electrolytes: K 3.5 (s/p 20 mEq IV), phos down to 2.1 (none in TPN), Mg 2.3 (s/p 1g IV on 1/15). CO now 23. Other electrolytes wnl.  Renal: BL SCr ~1.1. CRRT >> iHD, last session 1/14 (2.5 hrs d/t poor access flow and clotting) >> CRRT resumed 1/15 with 4/2.5 fluids Hepatic: LFTs elevated and trending down (elevated prior to TPN initiation), tbili down to 1.9 (no jaundice). TG 264, albumin 2.6 Intake / Output; MIVF: net -1.7L, LBM 1/15 GI Imaging: none since start of TPN GI Surgeries / Procedures: none since start of TPN  Central access: Triple lumen CVC placed 10/28/21 (confirmed with RN dedicated lumen available; confirmed with CCM ok to use line for TPN despite age of line; unable to place new line due to AKI and platelets) TPN start date: 11/08/21  Nutritional Goals: RD Estimated Needs Total Energy Estimated Needs: 1950-2150 kcals Total Protein Estimated Needs: 100-125 g Total Fluid Estimated Needs: >/= 1.8 L  Current Nutrition:  TPN  Plan:  Continue concentrated TPN at goal rate 70 mL/hr to provide 121g AA (~1.9g AA based on ABW which is appropriate for CRRT), 252g CHO and 61g ILE for a total  of 1953 kCal, meeting 100% of needs Electrolytes in TPN: Add 5 mmol/L of phos. Continue Na 75 mEq/L (increased on 1/14), K 6 mEq/L (= 10 mEq/d), Ca 2.5 mEq/L, Mg 3 mEq/L. Max acetate.  Add K phos 10 mmol x1 (~15 mEq of K) - discussed with Jannifer Hick Add standard MVI and trace elements to TPN - remove chromium with RRT needs and adjust trace elements to 1/2 due to jaundice reported by family and RN and tbili just below cut off of 2.0 for adjustment with jaundice present Continue insulin drip per primary team while on dexamethasone (last dose scheduled 1/19) Follow up RRT plans - plan to continue CRRT for now and possible iHD in coming days  Standard TPN labs on Mon/Thurs, repeat electrolytes tomorrow and repeat TG on Thursday for need to adjust lipids  Monitor MBS results and platelets for ability to have tube placed for enteral feeding   Cristela Felt, PharmD, BCPS Clinical Pharmacist 11/12/2021 8:27 AM

## 2021-11-12 NOTE — Progress Notes (Signed)
0700 - Report received from PM RN.  All questions answered.  Safety checks performed.  All lines and drips verified. Hand hygiene performed. Pt's daughter, [name], at bedside.  All questions answered. [id number] Laurey Arrow, CC NP rounded.  Assessment. 2094 - Dr. Johnney Ou rounded. [place], ST rounded.  She advised that pt wasn't a good candidate for FEES study d/t bleeding in nose/mouth, nor is she a good candidate for a barium swallow study while still on CRRT.  She elected to clean pt's mouth thoroughly and see how she did with ice chips in reverse trendelenburg position.  Mickel Baas, SLP advised that the pt can have 1-2 ice chips after thorough oral care. 43 - Dr. Lynetta Mare rounded. 1030 - Morning rounds. [place], OT worked with pt. 1500 - Pt's daughter, [name], arrived to visit. [place], Charge RN asked about new TPN bag as it was nearly empty.  She advised that IV therapy usually brought the bags up between 1730-1800. 1800 - TPN completed.  New TPN bag unavailable.  Altha Harm, Agricultural consultant notified. 55 - Order for IV team placed to bring TPN.  IV team paged. 1830 - Pt requesting ice chip.  Pt's mouth cleaned, then given an ice chip. 1835 - TPN still unavailable.  Altha Harm, Charge RN advised to call IV admixture number.  No answer.  Altha Harm advised that she would go to the IV therapy room to see if it was there. 1840 - Christine, Agricultural consultant returned.  She advised that she talked to IV therapy and they would be bringing one up shortly. 1845 - IV therapy arrived and hung TPN. 1900 - Report given to PM RN.  All questions answered.

## 2021-11-12 NOTE — Progress Notes (Signed)
Speech Language Pathology Treatment: Dysphagia  Patient Details Name: [name] MRN: [id number] DOB: 08-12-1951 Today's Date: 11/12/2021 Time: 1011-1030 SLP Time Calculation (min) (ACUTE ONLY): 19 min  Assessment / Plan / Recommendation Clinical Impression  Pt was seen for ongoing swallowing tx with other daughter present today. Pt's HOB was elevated with reverse Trendelenburg and oral care was performed with red blood noted in oral cavity. Pt is sensitive to use of yankauer by SLP but during PO trials, was given Min cues to utilize it herself so that she could have more control over placement. SLP provided just a few single pieces of ice to provide moisture and utilize swallowing musculature. Positively, pt is now able to produce some phonation, although still quite hoarse, soft, and short in duration. Pt, daughter, and staff updated on recommendation for instrumental testing prior to any further PO intake given high risk for dysphagia related adverse events, but medically she is not ready for this. Would not administer FEES given epistaxis over the weekend with NTS, and pt is not able to go to radiology for [facility] given need for CRRT and femoral line. Will f/u to complete as able, advising family that this will take time.    HPI HPI: Pt is a 71 yo female presenting 1/1 after prolonged OOH VF arrest (20 min of CPR, also coded 2x in ED, 4 min and 3 min) secondary to massive PE complicated by DIC. She is s/p thrombectomy on 1/2. Hospital course further complicated by AKI requiring CRRT, hemorrhagic shock due to hepatic bleeding, vomiting wtih TF. PMH includes DM, HTN, HLD, GERD, anxiety, depression, fibromyalgia      SLP Plan  Continue with current plan of care      Recommendations for follow up therapy are one component of a multi-disciplinary discharge planning process, led by the attending physician.  Recommendations may be updated based on patient status, additional functional criteria and  insurance authorization.    Recommendations  Diet recommendations: NPO (Ice chips PRN after oral care from staff) Medication Administration: Via alternative means                Oral Care Recommendations: Oral care QID;Oral care prior to ice chip/H20 Follow Up Recommendations: Acute inpatient rehab (3hours/day) Assistance recommended at discharge: Intermittent Supervision/Assistance SLP Visit Diagnosis: Dysphagia, unspecified (R13.10) Plan: Continue with current plan of care           Osie Bond., M.A. [facility] Pager [phone] Office [phone]  11/12/2021, 10:33 AM

## 2021-11-12 NOTE — Progress Notes (Signed)
Unfortunately, Ms. Eskin's platelet count is trending back downward.  She was doing better.  Her platelet count was 15,000 yesterday.  Today, it is 9000.  She is back on pressor support.  She is on antibiotics.  She is getting dialysis.  I know that the HIT assays were all negative.  However, I still wonder if there maybe a role that heparin could be playing.  I was wondering if there is any way that heparin could be stopped with respect to infusing for catheter maintenance.  Her creatinine is doing much better.  Today the BUN 78 creatinine 2.83.  Her white cell count is 27,000.  Hemoglobin 10.2.  I will give her another dose of Nplate today.  It be nice to try to minimize her medications.  However, I realize that she does need to be on quite a few different medications.  She is still on TNA.  So far, there is no obvious bleeding.  I still have her on the bivalirudin infusion for the thromboembolic disease.  Her anticardiolipin antibody was negative.  However, I am still not sure as to why she has had the pulmonary embolism.  She still is going need to have another liver MRI.  We are still not sure what is going on in the liver.  I suppose she could have a malignancy in the liver that may be triggering a hypercoagulable state.  Again, as long she is not bleeding, I would not give her platelets.  [name], MD  [facility]
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
## 2021-11-12 NOTE — Progress Notes (Signed)
Pt unable to tolerate CPT at this time. 

## 2021-11-12 NOTE — Progress Notes (Signed)
ANTICOAGULATION CONSULT NOTE  Pharmacy Consult for bivalirudin Indication: pulmonary embolus  Allergies  Allergen Reactions   Aspirin Nausea And Vomiting    Other reaction(s): Unknown   Benadryl [Diphenhydramine]     Can only take dye free. States the dye causes itching.   Darvon [Propoxyphene] Itching    Can only during the day. Night time makes her itch   Hydrocodone Bit-Homatrop Mbr Itching   Metformin     Other reaction(s): GI   Other     Other reaction(s): Unknown. States reaction was to nasal spray that caused pupils to shrink   Pentazocine     nervous Other reaction(s): jitters   Percocet [Oxycodone-Acetaminophen]     itching   Sulfa Antibiotics Hives   Tetracaine Hcl     Other reaction(s): Unknown. Thinks caused itching.   Tetracyclines & Related Hives   Tramadol     Other reaction(s): itch    Patient Measurements: Height: 5\' 5"  (165.1 cm) Weight: 75.4 kg (166 lb 3.6 oz) IBW/kg (Calculated) : 57  Vital Signs: Temp: 97.3 F (36.3 C) (01/15 2000) Temp Source: Oral (01/15 2000) BP: 92/53 (01/16 0600) Pulse Rate: 88 (01/16 0600)  Labs: Recent Labs    11/10/21 0459 11/10/21 1200 11/10/21 2239 11/10/21 2257 11/11/21 0209 11/11/21 0500 11/11/21 1708 11/12/21 0432  HGB 8.5*  --  8.2*  --   --  9.3*  --  10.2*  HCT 25.8*  --  24.0*  --   --  26.8*  --  29.7*  PLT 14*  --   --   --   --  15*  --  9*  APTT 41*   < >  --   --   --  68* 52* 49*  CREATININE 5.72*  --   --    < >  --  5.99* 4.22* 2.83*  TROPONINIHS  --   --   --   --  210* 194*  --   --    < > = values in this interval not displayed.     Estimated Creatinine Clearance: 18.3 mL/min (A) (by C-G formula based on SCr of 2.83 mg/dL (H)).   Assessment: 71 yo female with PE s/p thrombectomy, possible HIT, for bivalirudin. aPTT today subtherapeutic at 49 on 0.09 mg/kg bilvalirudin. Hemoglobin improved to 10.2 and platelets decreased from 14 to 9. No bleeding per RN.   She was restarted on CRRT  1/15 and received steroids and another dose of Nplate 1/16. Since she is barely subtherapeutic and she is high risk and has had multiple adjustments to her care in the last 12h, plan to continue the current rate with a 12h check at 1700.  Goal of Therapy:  PTT goal 50-85 sec Monitor platelets by anticoagulation protocol: Yes   Plan:  Continue bivalirudin 0.09 mg/kg/hr aPTT in PM   Varney Daily, PharmD PGY1 Pharmacy Resident  Please check AMION for all [facility] pharmacy phone numbers After 10:00 PM call main pharmacy [phone]

## 2021-11-12 NOTE — Progress Notes (Addendum)
[facility] Progress Note     Assessment/ Plan:   Renal failure secondary to ATN in the setting of cardiac arrest, hypotension, pressors, contrast.  BL CKD3A with BL creatinine in the 1.17-1.3 range in late 2021. - R fem HD cath - CRRT 10/31/20 - 1/11/ -HD 11/08/21--> had to have pressor with it - HD treatment 11/10/20--> cathether issues- wonder if BFR wasn't achieved--> BUN up, breathing worse, + fluid balance again, AMS so resumed CRRT 1/15 - CRRT continue for now but I anticipate in the coming days will make another attempt at converting to iHD.  If BPs down through the day decrease UF goal.    Prolonged VF cardiac arrest--> OOH VF arrest 2/2 massive PE  Massive PE s/p mechanical thrombectomy 1/2 of R and L PA.  Vasc US- no DVT, liver mass noted 10/28/21 CT abd/ pelvis, s/p MRI abd--> incompletely characterized, will need to be repeated  Retroperitoneal bleed/Hemorrhagic shock/Acute blood loss anemia originating from left liver lobe s/p embolization 1/4--> required FFP, plts, Vit K, pRBCs  Thrombocytopenia- component of DIC suspected, HIT negative, on bivalirudin and avoiding heparin per heme, getting platelet transfusions PRN, heme involved, suspects ITP, getting Nplate and dex.    DM  HTN  Acute hypoxic RF- extubated 11/07/21, remains on HFNC  Sepsis/ leukocytosis/ aspiration pneumonia- MRI with aspiration PNA.  On day 2 of CTX and vanc now as of 1/16  FEN: on TPN  Dispo: ICU  Subjective:    Resumed CRRT yesterday after BFR issues on attempt at HD Sat.   Going well per RN. UF documented as 7.2L, I/Os 2.8/ 7.2, neg neg 4.4L.  Intermittently on low dose NE Pt denies new complaints.  Daughter bedside, pleased with progress.    Objective:   BP (!) 88/55    Pulse 83    Temp 97.8 F (36.6 C) (Axillary)    Resp 14    Ht 5\' 5"  (1.651 m)    Wt 75.4 kg    SpO2 96%    BMI 27.66 kg/m   Intake/Output Summary (Last 24 hours) at 11/12/2021 0815 Last data filed at  11/12/2021 0800 Gross per 24 hour  Intake 2971.51 ml  Output 7251 ml  Net -4279.49 ml    Weight change: -4.8 kg  Physical Exam: GEN: NAD, lying flat in bed HEENT:  sclerae anicteric NECK: Supple, no thyromegaly LUNGS: normal WOB on HFNC, coarse ant, no wheezing or rales CV: Reg rate ABD: soft, nontender EXT: edema resolved, in heel protectors ACCESS: L fem nontunneled HD cath   Imaging: DG CHEST PORT 1 VIEW  Result Date: 11/10/2021 CLINICAL DATA:  Hypoxia EXAM: PORTABLE CHEST 1 VIEW COMPARISON:  11/01/2021 FINDINGS: Single frontal view of the chest demonstrates interval removal of the endotracheal and enteric catheters. Right internal jugular catheter unchanged. Cardiac silhouette is stable. Chronic elevation of the right hemidiaphragm. Improved aeration at the lung bases, with minimal residual atelectasis. Mild central vascular congestion. No effusion or pneumothorax. IMPRESSION: 1. Low lung volumes, with improved aeration at the lung bases. 2. Central vascular congestion without overt edema. Electronically Signed   By: Randa Ngo M.D.   On: 11/10/2021 22:09    Labs: BMET Recent Labs  Lab 11/07/21 0416 11/07/21 1523 11/08/21 0420 11/08/21 1335 11/09/21 0458 11/10/21 0459 11/10/21 2239 11/10/21 2257 11/11/21 0500 11/11/21 1708 11/12/21 0432  NA 136 134* 140   136   < > 138 139 142 130* 138 134* 136  K 3.8 4.0 4.5  4.5   < > 3.9 3.7 2.7* 3.7 3.3* 3.4* 3.5  CL 105 100 101   103  --  102 103  --  98 101 103 101  CO2 21* 19* 21*   20*  --  21* 20*  --  17* 17* 20* 23  GLUCOSE 143* 224* 254*   261*  --  240* 224*  --  571* 392* 154* 152*  BUN 40* 42* 69*   70*  --  83* 131*  --  167* 148* 107* 78*  CREATININE 1.93* 2.22* 3.65*   3.56*  --  4.20* 5.72*  --  6.85* 5.99* 4.22* 2.83*  CALCIUM 7.8* 7.9* 8.2*   7.8*  --  8.2* 8.1*  --  7.5* 8.2* 8.1* 8.5*  PHOS 2.7 4.0 6.1*  --  5.1*  --   --   --  5.3* 3.2 2.1*   < > = values in this interval not displayed.     CBC Recent Labs  Lab 11/09/21 1445 11/10/21 0459 11/10/21 2239 11/11/21 0500 11/12/21 0432  WBC 26.6* 23.8*  --  38.6* 27.0*  HGB 7.0* 8.5* 8.2* 9.3* 10.2*  HCT 21.1* 25.8* 24.0* 26.8* 29.7*  MCV 89.0 86.3  --  85.6 85.6  PLT 9* 14*  --  15* 9*     Medications:     chlorhexidine  15 mL Mouth Rinse BID   Chlorhexidine Gluconate Cloth  6 each Topical Daily   mouth rinse  15 mL Mouth Rinse BID   mouth rinse  15 mL Mouth Rinse q12n4p   pantoprazole (PROTONIX) IV  40 mg Intravenous Q1400   romiPLOStim  2 mcg/kg Subcutaneous Once   sodium chloride HYPERTONIC  4 mL Nebulization TID    Jannifer Hick MD [facility] Pager [phone]

## 2021-11-12 NOTE — Assessment & Plan Note (Signed)
Due to PNA Plan Trend

## 2021-11-12 NOTE — Assessment & Plan Note (Signed)
Plan Continue TNA Oral diet once we can safely proceed

## 2021-11-12 NOTE — Assessment & Plan Note (Signed)
Cough mechanics still poor. Still has significant bloody pulmonary secretions and epistaxis  Plan Cont wean oxygen  Pulse ox  pulm hygiene (IS and Flutter) Mobilize once able

## 2021-11-12 NOTE — Assessment & Plan Note (Signed)
Plan Cont bivalirudin  Eventually transition to [place]

## 2021-11-13 DIAGNOSIS — Z992 Dependence on renal dialysis: Secondary | ICD-10-CM

## 2021-11-13 LAB — GLUCOSE, CAPILLARY
Glucose-Capillary: 165 mg/dL — ABNORMAL HIGH (ref 70–99)
Glucose-Capillary: 191 mg/dL — ABNORMAL HIGH (ref 70–99)
Glucose-Capillary: 200 mg/dL — ABNORMAL HIGH (ref 70–99)
Glucose-Capillary: 160 mg/dL — ABNORMAL HIGH (ref 70–99)
Glucose-Capillary: 172 mg/dL — ABNORMAL HIGH (ref 70–99)
Glucose-Capillary: 178 mg/dL — ABNORMAL HIGH (ref 70–99)
Glucose-Capillary: 190 mg/dL — ABNORMAL HIGH (ref 70–99)
Glucose-Capillary: 170 mg/dL — ABNORMAL HIGH (ref 70–99)
Glucose-Capillary: 172 mg/dL — ABNORMAL HIGH (ref 70–99)
Glucose-Capillary: 244 mg/dL — ABNORMAL HIGH (ref 70–99)
Glucose-Capillary: 214 mg/dL — ABNORMAL HIGH (ref 70–99)
Glucose-Capillary: 182 mg/dL — ABNORMAL HIGH (ref 70–99)
Glucose-Capillary: 188 mg/dL — ABNORMAL HIGH (ref 70–99)
Glucose-Capillary: 202 mg/dL — ABNORMAL HIGH (ref 70–99)
Glucose-Capillary: 207 mg/dL — ABNORMAL HIGH (ref 70–99)
Glucose-Capillary: 146 mg/dL — ABNORMAL HIGH (ref 70–99)
Glucose-Capillary: 183 mg/dL — ABNORMAL HIGH (ref 70–99)
Glucose-Capillary: 194 mg/dL — ABNORMAL HIGH (ref 70–99)
Glucose-Capillary: 110 mg/dL — ABNORMAL HIGH (ref 70–99)
Glucose-Capillary: 192 mg/dL — ABNORMAL HIGH (ref 70–99)
Glucose-Capillary: 187 mg/dL — ABNORMAL HIGH (ref 70–99)

## 2021-11-13 LAB — RENAL FUNCTION PANEL
Albumin: 2.9 g/dL — ABNORMAL LOW (ref 3.5–5.0)
Albumin: 2.7 g/dL — ABNORMAL LOW (ref 3.5–5.0)
Anion gap: 14 (ref 5–15)
Anion gap: 12 (ref 5–15)
BUN: 61 mg/dL — ABNORMAL HIGH (ref 8–23)
BUN: 58 mg/dL — ABNORMAL HIGH (ref 8–23)
CO2: 23 mmol/L (ref 22–32)
CO2: 23 mmol/L (ref 22–32)
Calcium: 8.8 mg/dL — ABNORMAL LOW (ref 8.9–10.3)
Calcium: 8.3 mg/dL — ABNORMAL LOW (ref 8.9–10.3)
Chloride: 97 mmol/L — ABNORMAL LOW (ref 98–111)
Chloride: 99 mmol/L (ref 98–111)
Creatinine, Ser: 2.03 mg/dL — ABNORMAL HIGH (ref 0.44–1.00)
Creatinine, Ser: 1.86 mg/dL — ABNORMAL HIGH (ref 0.44–1.00)
GFR, Estimated: 25 mL/min — ABNORMAL LOW (ref >60)
GFR, Estimated: 28 mL/min — ABNORMAL LOW (ref >60)
Glucose, Bld: 170 mg/dL — ABNORMAL HIGH (ref 70–99)
Glucose, Bld: 185 mg/dL — ABNORMAL HIGH (ref 70–99)
Phosphorus: 2.1 mg/dL — ABNORMAL LOW (ref 2.5–4.6)
Phosphorus: 2.9 mg/dL (ref 2.5–4.6)
Potassium: 3.9 mmol/L (ref 3.5–5.1)
Potassium: 4.3 mmol/L (ref 3.5–5.1)
Sodium: 134 mmol/L — ABNORMAL LOW (ref 135–145)
Sodium: 134 mmol/L — ABNORMAL LOW (ref 135–145)

## 2021-11-13 LAB — CBC
HCT: 33.9 % — ABNORMAL LOW (ref 36.0–46.0)
Hemoglobin: 11.6 g/dL — ABNORMAL LOW (ref 12.0–15.0)
MCH: 29.6 pg (ref 26.0–34.0)
MCHC: 34.2 g/dL (ref 30.0–36.0)
MCV: 86.5 fL (ref 80.0–100.0)
Platelets: 11 10*3/uL — CL (ref 150–400)
RBC: 3.92 MIL/uL (ref 3.87–5.11)
RDW: 21.2 % — ABNORMAL HIGH (ref 11.5–15.5)
WBC: 33.1 10*3/uL — ABNORMAL HIGH (ref 4.0–10.5)
nRBC: 0.6 % — ABNORMAL HIGH (ref 0.0–0.2)

## 2021-11-13 LAB — PROCALCITONIN: Procalcitonin: 3.88 ng/mL

## 2021-11-13 LAB — APTT
aPTT: 53 seconds — ABNORMAL HIGH (ref 24–36)
aPTT: 47 seconds — ABNORMAL HIGH (ref 24–36)
aPTT: 49 seconds — ABNORMAL HIGH (ref 24–36)

## 2021-11-13 LAB — MRSA NEXT GEN BY PCR, NASAL: MRSA by PCR Next Gen: NOT DETECTED

## 2021-11-13 LAB — MAGNESIUM: Magnesium: 2.6 mg/dL — ABNORMAL HIGH (ref 1.7–2.4)

## 2021-11-13 MED ORDER — POTASSIUM PHOSPHATES 15 MMOLE/5ML IV SOLN
10.0000 mmol | Freq: Once | INTRAVENOUS | Status: AC
  Administered 2021-11-13: 10 mmol via INTRAVENOUS
  Filled 2021-11-13: qty 3.33

## 2021-11-13 MED ORDER — TRACE MINERALS CU-MN-SE-ZN 300-55-60-3000 MCG/ML IV SOLN
INTRAVENOUS | Status: AC
  Filled 2021-11-13: qty 806.4
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
## 2021-11-13 NOTE — Progress Notes (Signed)
[name] Progress Note     Assessment/ Plan:   Renal failure secondary to ATN in the setting of cardiac arrest, hypotension, pressors, contrast.  BL CKD3A with BL creatinine in the 1.17-1.3 range in late 2021. - R fem HD cath - CRRT 10/31/20 - 1/11/ -HD 11/08/21--> had to have pressor with it - HD treatment 11/10/20--> cathether issues- wonder if BFR wasn't achieved--> BUN up, breathing worse, + fluid balance again, AMS so resumed CRRT 1/15 - CRRT continue for now but I anticipate in the coming days will make another attempt at converting to iHD.  Decrease UFR today - aim net net 50/hr to net even today.  D/w RN - NO  increase vasopressor to facilitate fluid removal.    Prolonged VF cardiac arrest--> OOH VF arrest 2/2 massive PE  Massive PE s/p mechanical thrombectomy 1/2 of R and L PA.  Vasc US- no DVT, liver mass noted 10/28/21 CT abd/ pelvis, s/p MRI abd--> incompletely characterized, will need to be repeated  Retroperitoneal bleed/Hemorrhagic shock/Acute blood loss anemia originating from left liver lobe s/p embolization 1/4--> required FFP, plts, Vit K, pRBCs  Thrombocytopenia- component of DIC suspected, HIT negative, on bivalirudin and avoiding heparin per heme, getting platelet transfusions PRN, heme involved, suspects ITP, getting Nplate and dex.    DM: insulin per primary  Acute hypoxic RF- extubated 11/07/21, remains on HFNC  Sepsis/ leukocytosis/ aspiration pneumonia- MRI with aspiration PNA.  On CTX and vanc currently  FEN: on TPN  Dispo: ICU  Subjective:    Continued on CRRT yesterday after BFR issues on attempt at HD Sat.   Going well per RN. I/Os 3 / 7.7, neg neg 4.6L. Will back off today. Intermittently on low dose NE, dose up a bit this AM Pt denies new complaints.  Daughter bedside, pleased with progress.  Happy birthday to her.    Objective:   BP (!) 77/56    Pulse (!) 116    Temp 98 F (36.7 C) (Oral)    Resp 20    Ht 5\' 5"  (1.651 m)    Wt  75.4 kg    SpO2 95%    BMI 27.66 kg/m   Intake/Output Summary (Last 24 hours) at 11/13/2021 1046 Last data filed at 11/13/2021 0900 Gross per 24 hour  Intake 2742.08 ml  Output 6964 ml  Net -4221.92 ml    Weight change:   Physical Exam: GEN: NAD, lying flat in bed HEENT:  sclerae anicteric NECK: Supple, no thyromegaly LUNGS: normal WOB on HFNC, coarse ant, no wheezing or rales CV: Reg rate ABD: soft, nontender EXT: edema resolved, in heel protectors ACCESS: L fem nontunneled HD cath   Imaging: DG Chest Port 1 View  Result Date: 11/12/2021 CLINICAL DATA:  Cardiac arrest, acute respiratory failure. EXAM: PORTABLE CHEST 1 VIEW COMPARISON:  11/10/2021 and CT chest 10/28/2021. FINDINGS: Patient is rotated. Trachea is midline. Right IJ central line tip is in the right atrium. Heart size is stable. Lungs are low in volume with left lower lobe airspace opacification. Small left pleural effusion. Right hemidiaphragm is elevated. IMPRESSION: Persistent left lower lobe airspace opacification, possibly due to pneumonia. Associated small left pleural effusion. Electronically Signed   By: Lorin Picket M.D.   On: 11/12/2021 08:47    Labs: BMET Recent Labs  Lab 11/08/21 0420 11/08/21 1335 11/09/21 0458 11/10/21 0459 11/10/21 2239 11/10/21 2257 11/11/21 0500 11/11/21 1708 11/12/21 0432 11/12/21 1630 11/13/21 0406  NA 140   136   < >  138 139 142 130* 138 134* 136 135 134*  K 4.5   4.5   < > 3.9 3.7 2.7* 3.7 3.3* 3.4* 3.5 4.3 3.9  CL 101   103  --  102 103  --  98 101 103 101 100 97*  CO2 21*   20*  --  21* 20*  --  17* 17* 20* 23 23 23   GLUCOSE 254*   261*  --  240* 224*  --  571* 392* 154* 152* 231* 170*  BUN 69*   70*  --  83* 131*  --  167* 148* 107* 78* 66* 61*  CREATININE 3.65*   3.56*  --  4.20* 5.72*  --  6.85* 5.99* 4.22* 2.83* 2.25* 2.03*  CALCIUM 8.2*   7.8*  --  8.2* 8.1*  --  7.5* 8.2* 8.1* 8.5* 8.5* 8.8*  PHOS 6.1*  --  5.1*  --   --   --  5.3* 3.2 2.1* 1.8* 2.1*   <  > = values in this interval not displayed.    CBC Recent Labs  Lab 11/10/21 0459 11/10/21 2239 11/11/21 0500 11/12/21 0432 11/13/21 0406  WBC 23.8*  --  38.6* 27.0* 33.1*  HGB 8.5* 8.2* 9.3* 10.2* 11.6*  HCT 25.8* 24.0* 26.8* 29.7* 33.9*  MCV 86.3  --  85.6 85.6 86.5  PLT 14*  --  15* 9* 11*     Medications:     chlorhexidine  15 mL Mouth Rinse BID   Chlorhexidine Gluconate Cloth  6 each Topical Daily   mouth rinse  15 mL Mouth Rinse BID   mouth rinse  15 mL Mouth Rinse q12n4p   pantoprazole (PROTONIX) IV  40 mg Intravenous Q1400   sodium chloride HYPERTONIC  4 mL Nebulization TID    Jannifer Hick MD [facility] Pager [phone]

## 2021-11-13 NOTE — Progress Notes (Signed)
PHARMACY - TOTAL PARENTERAL NUTRITION CONSULT NOTE  Indication:  Intolerance of enteral feeding and unable to obtain enteral access  Patient Measurements: Height: 5\' 5"  (165.1 cm) Weight: 75.4 kg (166 lb 3.6 oz) IBW/kg (Calculated) : 57   Body mass index is 27.66 kg/m.  Assessment:  65 YOF who was admitted 10/28/21 after cardiac arrest and out of hospital CPR due to large saddle PE. Patient has been intubated and course complicated by uGIB and significantly low platelets, acute hepatic and renal failure.  Unable to place Cortrak due to thrombocytopenia, so Pharmacy consulted to manage TPN.   Glucose / Insulin: hx DM possibly on Lantus 22/d and Prandin 2mg  BID PTA Decadron 20mg  daily ended 1/4 Switched to vsSSI and Levemir 24 units BID briefly 1/14 PM, back on insulin gtt CBGs 170-190s.  Electrolytes: Na 134, K 3.9 (on prismasol 4/2.5), phos down to 2.1 (s/p 10 mmol K phos and phos added to TPN on 1/16), Mg 2.6. Other electrolytes wnl.  Renal: BL SCr ~1.1. CRRT >> iHD, last session 1/14 (2.5 hrs d/t poor access flow and clotting) >> CRRT resumed 1/15 with 4/2.5 fluids Hepatic: LFTs elevated and trending down (elevated prior to TPN initiation), tbili 1.9 (sclera jaundice noted by family on RN on 1/16). TG 264, albumin 2.9 Intake / Output; MIVF: net -7.3L, LBM 1/17 GI Imaging: none since start of TPN GI Surgeries / Procedures: none since start of TPN  Central access: Triple lumen CVC placed 10/28/21 (confirmed with RN dedicated lumen available; confirmed with CCM ok to use line for TPN despite age of line; unable to place new line due to AKI and platelets) TPN start date: 11/08/21  Nutritional Goals: RD Estimated Needs Total Energy Estimated Needs: 1950-2150 kcals Total Protein Estimated Needs: 100-125 g Total Fluid Estimated Needs: >/= 1.8 L  Current Nutrition:  TPN  Plan:  Continue concentrated TPN at goal rate 70 mL/hr to provide 121g AA (~1.9g AA based on ABW which is appropriate  for CRRT), 252g CHO and 61g ILE for a total of 1953 kCal, meeting 100% of needs Electrolytes in TPN: Remove Mg. Increase Na to 100 mEq/L. Continue K 6 mEq/L (= 10 mEq/d), Ca 2.5 mEq/L, Phos 5 mmol/L. Adjust Cl:Ac 1:2 K phos 10 mmol x1  Add standard MVI and trace elements to TPN - remove chromium with RRT needs  Continue 1/2 trace elements due to jaundice reported by family and RN and tbili just below cut off of 2.0 for adjustment with jaundice present Continue insulin drip per primary team while on dexamethasone (last dose scheduled 1/19) Standard TPN labs on Mon/Thurs, repeat electrolytes tomorrow and repeat TG on Thursday for need to adjust lipids  Follow up MBS results (planned for 1/18 per RN) and platelets for ability to have tube placed for enteral feeding   Cristela Felt, PharmD, BCPS Clinical Pharmacist 11/13/2021 10:49 AM

## 2021-11-13 NOTE — Progress Notes (Signed)
0700 - Report received from PM RN.  All questions answered.  Safety checks performed.  All lines and drips verified. Hand hygiene performed before/after each pt contact. Pt's daughter, [name], at bedside. 0730 Laurey Arrow, CC NP rounded.  Laurey Arrow advised if CRRT clots or stops, then he would like pt to have a barium swallow study done. 0830 - Attempted to call speech.  VM left. 2224 - I spoke with Mickel Baas, ST.  She advised that it would be unlikely to get pt down for a barium swallow study today, but she would get her on the schedule as early as possible tomorrow.  Laurey Arrow, CC NP notified.   67 - Pt's daughter, [name], arrived to visit.  Pt bathed with soap and CHG.  1020 - CRRT clotted.  Filter removed.  New filter primed. 1100 - Dr. Johnney Ou rounded.  Advised to pull net -50/hr with CRRT, but not to increase pressor requirements to pull volume. [place], PT worked with pt.  Pt able to stand at bedside with 1 assist. 1415 Mickel Baas, ST called to advise that pt could be first on the swallow study list for tomorrow.  She asked if it was possible for the pt to go down at 07:30, but I advised that 0800 might be more feasible.  She advised to call fluoro tomorrow (1/18) at [phone] if she could go earlier. 1500 Laurey Arrow, CC NP updated. 1734 - IV therapy changed pt's TPN bag. 1900 - Report given to PM RN.  All questions answered.  Various friends and family members visited during the day.

## 2021-11-13 NOTE — Progress Notes (Signed)
Overall, Ms. Garnett might be a little bit better.  She is on continuous dialysis.  There is no heparin being given in the machine.  It is her birthday today.  Hopefully she will have many more.  Her platelet count is 11,000.  I did give her a dose of Nplate yesterday.  I got her back on some steroids.  Her hemoglobin is 11.6.  Her white cell count 33.1.  Hopefully, given that her hemoglobin is increasing, maybe this is a good indicator that her bone marrow might be starting to regain some function.  Her BUN is 61 and the creatinine is 2.0.  She is continues on the bivalirudin infusion for her thromboembolic disease.  Hopefully, we will be able to do another MRI of the liver to see exactly what is going on in their.  I know there is still a long way to go with her.  She still not eating.  She is on TNA.  She is on Rocephin.  Cultures have been negative.  Maybe, this can be discontinued since cultures are negative.  She certainly seems to be more alert than yesterday.  I know her daughter says she is not sleeping and that she has some anxiety yesterday or over the weekend.  She has been afebrile.  I really cannot find anything on exam that localizes and that would explain the continued thrombocytopenia.  Again I had to believe that her bone marrow just took a "hit" from the cardiac arrest and is just having a slow time recovering.  As always, we had a wonderful prayer.  She has a very strong faith.  I am glad that she is able to celebrate her birthday today.  I know the staff is doing a great job in the cardiac care unit.  [name], MD  Lurena Joiner 1:37

## 2021-11-13 NOTE — Progress Notes (Addendum)
ANTICOAGULATION CONSULT NOTE  Pharmacy Consult for bivalirudin Indication: pulmonary embolus  Allergies  Allergen Reactions   Aspirin Nausea And Vomiting    Other reaction(s): Unknown   Benadryl [Diphenhydramine]     Can only take dye free. States the dye causes itching.   Darvon [Propoxyphene] Itching    Can only during the day. Night time makes her itch   Hydrocodone Bit-Homatrop Mbr Itching   Metformin     Other reaction(s): GI   Other     Other reaction(s): Unknown. States reaction was to nasal spray that caused pupils to shrink   Pentazocine     nervous Other reaction(s): jitters   Percocet [Oxycodone-Acetaminophen]     itching   Sulfa Antibiotics Hives   Tetracaine Hcl     Other reaction(s): Unknown. Thinks caused itching.   Tetracyclines & Related Hives   Tramadol     Other reaction(s): itch    Patient Measurements: Height: 5\' 5"  (165.1 cm) Weight: 75.4 kg (166 lb 3.6 oz) IBW/kg (Calculated) : 57  Vital Signs: Temp: 97.4 F (36.3 C) (01/17 1608) Temp Source: Oral (01/17 1608) BP: 84/49 (01/17 1745) Pulse Rate: 86 (01/17 1745)  Labs: Recent Labs    11/11/21 0209 11/11/21 0500 11/11/21 1708 11/12/21 0432 11/12/21 1630 11/13/21 0406 11/13/21 1557  HGB  --  9.3*  --  10.2*  --  11.6*  --   HCT  --  26.8*  --  29.7*  --  33.9*  --   PLT  --  15*  --  9*  --  11*  --   APTT  --  68*   < > 49* 53* 53* 47*  CREATININE  --  5.99*   < > 2.83* 2.25* 2.03*  --   TROPONINIHS 210* 194*  --   --   --   --   --    < > = values in this interval not displayed.    Estimated Creatinine Clearance: 25.1 mL/min (A) (by C-G formula based on SCr of 2.03 mg/dL (H)).   Assessment: 71 yo female with PE s/p thrombectomy, possible HIT, for bivalirudin. aPTT today subtherapeutic at 49 on 0.09 mg/kg bilvalirudin. Hemoglobin improved to 10.2 and platelets decreased from 14 to 9. No bleeding per RN.   She was restarted on CRRT 1/15 and received steroids and another dose of  Nplate 1/16. Last two aPTTs are therapeutic and have been stable at 53.  PM Update: aPTT is sub-therapeutic at 47 and trended down on current rate of bivalirudin 0.09 mg/kg/hr.    Goal of Therapy:  PTT goal 50-85 sec Monitor platelets by anticoagulation protocol: Yes   Plan:  Increase bivalirudin to 0.1 mg/kg/hr aPTT in 4 hours   Sloan Leiter, PharmD, BCPS, BCCCP Clinical Pharmacist Please refer to [facility] for [place] numbers 11/13/2021, 6:05 PM  Addendum:  2100 recheck remains just slightly low at 49 after rate change to 0.1 mg/kg/hr  Plan: Increase bivalrudin to 0.12 mg/kg/hr Repeat aPTT in 4 hrs (ok with am labs)  Sloan Leiter, PharmD, BCPS, BCCCP Clinical Pharmacist Please refer to [facility] for [place] numbers 11/13/2021, 10:47 PM

## 2021-11-13 NOTE — Progress Notes (Signed)
ANTICOAGULATION CONSULT NOTE  Pharmacy Consult for bivalirudin Indication: pulmonary embolus  Allergies  Allergen Reactions   Aspirin Nausea And Vomiting    Other reaction(s): Unknown   Benadryl [Diphenhydramine]     Can only take dye free. States the dye causes itching.   Darvon [Propoxyphene] Itching    Can only during the day. Night time makes her itch   Hydrocodone Bit-Homatrop Mbr Itching   Metformin     Other reaction(s): GI   Other     Other reaction(s): Unknown. States reaction was to nasal spray that caused pupils to shrink   Pentazocine     nervous Other reaction(s): jitters   Percocet [Oxycodone-Acetaminophen]     itching   Sulfa Antibiotics Hives   Tetracaine Hcl     Other reaction(s): Unknown. Thinks caused itching.   Tetracyclines & Related Hives   Tramadol     Other reaction(s): itch    Patient Measurements: Height: 5\' 5"  (165.1 cm) Weight: 75.4 kg (166 lb 3.6 oz) IBW/kg (Calculated) : 57  Vital Signs: Temp: 98.2 F (36.8 C) (01/17 0400) Temp Source: Axillary (01/17 0000) BP: 91/60 (01/17 0545) Pulse Rate: 104 (01/17 0600)  Labs: Recent Labs    11/11/21 0209 11/11/21 0500 11/11/21 1708 11/12/21 0432 11/12/21 1630 11/13/21 0406  HGB  --  9.3*  --  10.2*  --  11.6*  HCT  --  26.8*  --  29.7*  --  33.9*  PLT  --  15*  --  9*  --  11*  APTT  --  68*   < > 49* 53* 53*  CREATININE  --  5.99*   < > 2.83* 2.25* 2.03*  TROPONINIHS 210* 194*  --   --   --   --    < > = values in this interval not displayed.     Estimated Creatinine Clearance: 25.1 mL/min (A) (by C-G formula based on SCr of 2.03 mg/dL (H)).   Assessment: 71 yo female with PE s/p thrombectomy, possible HIT, for bivalirudin. aPTT today subtherapeutic at 49 on 0.09 mg/kg bilvalirudin. Hemoglobin improved to 10.2 and platelets decreased from 14 to 9. No bleeding per RN.   She was restarted on CRRT 1/15 and received steroids and another dose of Nplate 1/16. Last two aPTTs are  therapeutic and have been stable at 53.  Goal of Therapy:  PTT goal 50-85 sec Monitor platelets by anticoagulation protocol: Yes   Plan:  Continue bivalirudin 0.09 mg/kg/hr aPTT in PM   Varney Daily, PharmD PGY1 Pharmacy Resident  Please check AMION for all [facility] pharmacy phone numbers After 10:00 PM call main pharmacy [phone]

## 2021-11-13 NOTE — Progress Notes (Signed)
Physical Therapy Treatment Patient Details Name: [name] MRN: [id number] DOB: 30-May-1951 Today's Date: 11/13/2021   History of Present Illness 71 yo female presenting 1/1 after cardiac arrest requiring 20 min CPR for ROSC with pt intubated in the field prior to arrival. Pt had 2 additional codes in ED requiring CPR for 4 and 2 min respectively. Further work up revealed STEMI, presumed cardiogenic shock, large saddle PE, RUQ bleeding, and 2 rib fx. S/p PE thrombectomy 1/2, found to have intraperitoneal hemorrhage s/p embolization 1/5, extubated on 1/10. Restart CRRT on 1/15. PMH includes: fibromyalgia, HTN, DM II, CKD, GERD, and asthma.    PT Comments    Pt making steady progress. Able to stand briefly at bedside with 1 person assist. Pt is very motivated to work toward regaining independence. Feel once her medical status improves she will be excellent inpatient rehab candidate.   Recommendations for follow up therapy are one component of a multi-disciplinary discharge planning process, led by the attending physician.  Recommendations may be updated based on patient status, additional functional criteria and insurance authorization.  Follow Up Recommendations  Acute inpatient rehab (3hours/day)     Assistance Recommended at Discharge Frequent or constant Supervision/Assistance  Patient can return home with the following Two people to help with walking and/or transfers;A little help with bathing/dressing/bathroom;Assistance with cooking/housework;Assist for transportation;Help with stairs or ramp for entrance   Equipment Recommendations  Rolling walker (2 wheels);Wheelchair (measurements PT);Wheelchair cushion (measurements PT)    Recommendations for Other Services       Precautions / Restrictions Precautions Precautions: Fall;Other (comment) Precaution Comments: L femoral non-tunneled HD cath - back on CRRT 1/15. HFNC. MD okayed getting up with fem catheter     Mobility  Bed  Mobility Overal bed mobility: Needs Assistance Bed Mobility: Rolling, Supine to Sit, Sit to Supine Rolling: Min guard   Supine to sit: Mod assist Sit to supine: Max assist   General bed mobility comments: Assist to bring RLE off of bed, elevate trunk into sitting, and bring hips to EOB    Transfers Overall transfer level: Needs assistance Equipment used: Rolling walker (2 wheels) Transfers: Sit to/from Stand Sit to Stand: Max assist           General transfer comment: Assist to bring hips up and to block rt foot from sliding. Pt with posterior legs braced on bed and hips in slight flexion.    Ambulation/Gait             Pre-gait activities: Stood with walker x 30 sec with mod assist working on upright posture and hip extension     Stairs             Wheelchair Mobility    Modified Rankin (Stroke Patients Only)       Balance Overall balance assessment: Needs assistance Sitting-balance support: Feet supported, No upper extremity supported, Bilateral upper extremity supported Sitting balance-Leahy Scale: Fair Sitting balance - Comments: Static sitting with min guard for safety and lines   Standing balance support: Bilateral upper extremity supported, Reliant on assistive device for balance Standing balance-Leahy Scale: Poor Standing balance comment: walker and mod assist for static standing                            Cognition Arousal/Alertness: Awake/alert Behavior During Therapy: WFL for tasks assessed/performed Overall Cognitive Status: Impaired/Different from baseline Area of Impairment: Following commands  Following Commands: Follows one step commands with increased time       General Comments: Increased time for processing        Exercises      General Comments General comments (skin integrity, edema, etc.): VSS on HHFNC      Pertinent Vitals/Pain Pain Assessment Pain Assessment:  Faces Faces Pain Scale: Hurts little more Pain Location: RLE Pain Descriptors / Indicators: Sore Pain Intervention(s): Limited activity within patient's tolerance    Home Living                          Prior Function            PT Goals (current goals can now be found in the care plan section) Acute Rehab PT Goals Patient Stated Goal: go home Progress towards PT goals: Progressing toward goals    Frequency    Min 3X/week      PT Plan Current plan remains appropriate    Co-evaluation              AM-PAC PT "6 Clicks" Mobility   Outcome Measure  Help needed turning from your back to your side while in a flat bed without using bedrails?: A Little Help needed moving from lying on your back to sitting on the side of a flat bed without using bedrails?: A Lot Help needed moving to and from a bed to a chair (including a wheelchair)?: Total Help needed standing up from a chair using your arms (e.g., wheelchair or bedside chair)?: A Lot Help needed to walk in hospital room?: Total Help needed climbing 3-5 steps with a railing? : Total 6 Click Score: 10    End of Session Equipment Utilized During Treatment: Oxygen Activity Tolerance: Patient tolerated treatment well;Patient limited by fatigue Patient left: in bed;with call bell/phone within reach;with nursing/sitter in room;with family/visitor present Nurse Communication: Mobility status PT Visit Diagnosis: Unsteadiness on feet (R26.81);Other abnormalities of gait and mobility (R26.89);Muscle weakness (generalized) (M62.81)     Time: 9150-5697 PT Time Calculation (min) (ACUTE ONLY): 21 min  Charges:  $Therapeutic Activity: 8-22 mins                     [place] Pager [phone] Office [place] 11/13/2021, 12:16 PM

## 2021-11-14 ENCOUNTER — Inpatient Hospital Stay (HOSPITAL_COMMUNITY): Payer: Medicare PPO

## 2021-11-14 LAB — RENAL FUNCTION PANEL
Albumin: 2.9 g/dL — ABNORMAL LOW (ref 3.5–5.0)
Albumin: 2.4 g/dL — ABNORMAL LOW (ref 3.5–5.0)
Anion gap: 12 (ref 5–15)
Anion gap: 15 (ref 5–15)
BUN: 47 mg/dL — ABNORMAL HIGH (ref 8–23)
BUN: 98 mg/dL — ABNORMAL HIGH (ref 8–23)
CO2: 24 mmol/L (ref 22–32)
CO2: 20 mmol/L — ABNORMAL LOW (ref 22–32)
Calcium: 8.6 mg/dL — ABNORMAL LOW (ref 8.9–10.3)
Calcium: 8.4 mg/dL — ABNORMAL LOW (ref 8.9–10.3)
Chloride: 99 mmol/L (ref 98–111)
Chloride: 97 mmol/L — ABNORMAL LOW (ref 98–111)
Creatinine, Ser: 1.45 mg/dL — ABNORMAL HIGH (ref 0.44–1.00)
Creatinine, Ser: 2.81 mg/dL — ABNORMAL HIGH (ref 0.44–1.00)
GFR, Estimated: 38 mL/min — ABNORMAL LOW (ref >60)
GFR, Estimated: 17 mL/min — ABNORMAL LOW (ref >60)
Glucose, Bld: 115 mg/dL — ABNORMAL HIGH (ref 70–99)
Glucose, Bld: 214 mg/dL — ABNORMAL HIGH (ref 70–99)
Phosphorus: 2.8 mg/dL (ref 2.5–4.6)
Phosphorus: 4.2 mg/dL (ref 2.5–4.6)
Potassium: 3.9 mmol/L (ref 3.5–5.1)
Potassium: 4.8 mmol/L (ref 3.5–5.1)
Sodium: 135 mmol/L (ref 135–145)
Sodium: 132 mmol/L — ABNORMAL LOW (ref 135–145)

## 2021-11-14 LAB — GLUCOSE, CAPILLARY
Glucose-Capillary: 120 mg/dL — ABNORMAL HIGH (ref 70–99)
Glucose-Capillary: 122 mg/dL — ABNORMAL HIGH (ref 70–99)
Glucose-Capillary: 130 mg/dL — ABNORMAL HIGH (ref 70–99)
Glucose-Capillary: 130 mg/dL — ABNORMAL HIGH (ref 70–99)
Glucose-Capillary: 138 mg/dL — ABNORMAL HIGH (ref 70–99)
Glucose-Capillary: 141 mg/dL — ABNORMAL HIGH (ref 70–99)
Glucose-Capillary: 144 mg/dL — ABNORMAL HIGH (ref 70–99)
Glucose-Capillary: 160 mg/dL — ABNORMAL HIGH (ref 70–99)
Glucose-Capillary: 169 mg/dL — ABNORMAL HIGH (ref 70–99)
Glucose-Capillary: 170 mg/dL — ABNORMAL HIGH (ref 70–99)
Glucose-Capillary: 173 mg/dL — ABNORMAL HIGH (ref 70–99)
Glucose-Capillary: 175 mg/dL — ABNORMAL HIGH (ref 70–99)
Glucose-Capillary: 175 mg/dL — ABNORMAL HIGH (ref 70–99)
Glucose-Capillary: 178 mg/dL — ABNORMAL HIGH (ref 70–99)
Glucose-Capillary: 180 mg/dL — ABNORMAL HIGH (ref 70–99)
Glucose-Capillary: 190 mg/dL — ABNORMAL HIGH (ref 70–99)
Glucose-Capillary: 191 mg/dL — ABNORMAL HIGH (ref 70–99)
Glucose-Capillary: 193 mg/dL — ABNORMAL HIGH (ref 70–99)
Glucose-Capillary: 206 mg/dL — ABNORMAL HIGH (ref 70–99)
Glucose-Capillary: 209 mg/dL — ABNORMAL HIGH (ref 70–99)
Glucose-Capillary: 225 mg/dL — ABNORMAL HIGH (ref 70–99)
Glucose-Capillary: 320 mg/dL — ABNORMAL HIGH (ref 70–99)
Glucose-Capillary: 335 mg/dL — ABNORMAL HIGH (ref 70–99)

## 2021-11-14 LAB — APTT
aPTT: 49 seconds — ABNORMAL HIGH (ref 24–36)
aPTT: 68 seconds — ABNORMAL HIGH (ref 24–36)
aPTT: 67 seconds — ABNORMAL HIGH (ref 24–36)

## 2021-11-14 LAB — HAPTOGLOBIN: Haptoglobin: <10 mg/dL — ABNORMAL LOW (ref 42–346)

## 2021-11-14 LAB — CBC
HCT: 34.6 % — ABNORMAL LOW (ref 36.0–46.0)
Hemoglobin: 11.1 g/dL — ABNORMAL LOW (ref 12.0–15.0)
MCH: 28.5 pg (ref 26.0–34.0)
MCHC: 32.1 g/dL (ref 30.0–36.0)
MCV: 88.9 fL (ref 80.0–100.0)
Platelets: 14 10*3/uL — CL (ref 150–400)
RBC: 3.89 MIL/uL (ref 3.87–5.11)
RDW: 21.8 % — ABNORMAL HIGH (ref 11.5–15.5)
WBC: 33.7 10*3/uL — ABNORMAL HIGH (ref 4.0–10.5)
nRBC: 0.5 % — ABNORMAL HIGH (ref 0.0–0.2)

## 2021-11-14 LAB — HEMOGLOBIN FREE, PLASMA: Hgb, Plasma: 117.4 mg/dL — ABNORMAL HIGH (ref 0.0–4.9)

## 2021-11-14 LAB — AFP TUMOR MARKER: AFP, Serum, Tumor Marker: 2.3 ng/mL (ref 0.0–9.2)

## 2021-11-14 LAB — MAGNESIUM: Magnesium: 2.6 mg/dL — ABNORMAL HIGH (ref 1.7–2.4)

## 2021-11-14 MED ORDER — ENSURE ENLIVE PO LIQD
237.0000 mL | Freq: Two times a day (BID) | ORAL | Status: DC
  Administered 2021-11-16: 237 mL via ORAL

## 2021-11-14 MED ORDER — FOLIC ACID 1 MG PO TABS
2.0000 mg | ORAL_TABLET | Freq: Every day | ORAL | Status: DC
  Administered 2021-11-14 – 2021-12-04 (×19): 2 mg via ORAL
  Filled 2021-11-14 (×20): qty 2

## 2021-11-14 MED ORDER — MIDODRINE HCL 5 MG PO TABS
5.0000 mg | ORAL_TABLET | Freq: Three times a day (TID) | ORAL | Status: DC
  Administered 2021-11-14 – 2021-11-15 (×5): 5 mg via ORAL
  Filled 2021-11-14 (×5): qty 1

## 2021-11-14 MED ORDER — ATORVASTATIN CALCIUM 10 MG PO TABS
20.0000 mg | ORAL_TABLET | Freq: Every day | ORAL | Status: DC
  Administered 2021-11-14 – 2021-12-04 (×19): 20 mg via ORAL
  Filled 2021-11-14 (×20): qty 2

## 2021-11-14 MED ORDER — SODIUM CHLORIDE 0.9 % IV SOLN
0.0900 mg/kg/h | INTRAVENOUS | Status: DC
  Administered 2021-11-14 – 2021-11-19 (×5): 0.09 mg/kg/h via INTRAVENOUS
  Filled 2021-11-14 (×6): qty 250

## 2021-11-14 MED ORDER — TRACE MINERALS CU-MN-SE-ZN 300-55-60-3000 MCG/ML IV SOLN
INTRAVENOUS | Status: AC
  Filled 2021-11-14: qty 806.4

## 2021-11-14 NOTE — Progress Notes (Signed)
ANTICOAGULATION CONSULT NOTE  Pharmacy Consult for bivalirudin Indication: pulmonary embolus  Allergies  Allergen Reactions   Aspirin Nausea And Vomiting    Other reaction(s): Unknown   Benadryl [Diphenhydramine]     Can only take dye free. States the dye causes itching.   Darvon [Propoxyphene] Itching    Can only during the day. Night time makes her itch   Hydrocodone Bit-Homatrop Mbr Itching   Metformin     Other reaction(s): GI   Other     Other reaction(s): Unknown. States reaction was to nasal spray that caused pupils to shrink   Pentazocine     nervous Other reaction(s): jitters   Percocet [Oxycodone-Acetaminophen]     itching   Sulfa Antibiotics Hives   Tetracaine Hcl     Other reaction(s): Unknown. Thinks caused itching.   Tetracyclines & Related Hives   Tramadol     Other reaction(s): itch    Patient Measurements: Height: 5\' 5"  (165.1 cm) Weight: 67.5 kg (148 lb 13 oz) IBW/kg (Calculated) : 57  Vital Signs: Temp: 98.4 F (36.9 C) (01/18 0733) Temp Source: Oral (01/18 0733) BP: 83/52 (01/18 1015) Pulse Rate: 113 (01/18 1015)  Labs: Recent Labs    11/12/21 0432 11/12/21 1630 11/13/21 0406 11/13/21 1557 11/13/21 2209 11/14/21 0423  HGB 10.2*  --  11.6*  --   --  11.1*  HCT 29.7*  --  33.9*  --   --  34.6*  PLT 9*  --  11*  --   --  14*  APTT 49*   < > 53* 47* 49* 49*  CREATININE 2.83*   < > 2.03* 1.86*  --  1.45*   < > = values in this interval not displayed.     Estimated Creatinine Clearance: 31.1 mL/min (A) (by C-G formula based on SCr of 1.45 mg/dL (H)).   Assessment: 72 yo female with PE s/p thrombectomy, possible HIT, for bivalirudin. aPTT today therapeutic at 68 on 0.09 mg/kg bilvalirudin. Hemoglobin improved to 11.1 and platelets increased to 14. No bleeding per RN.   Due to subtherapeutic aPTTs overnight, bivalrudin was increased to 0.15 mg/kg/hr; however, around 0600 the CRRT was stopped. Preemptively, reduced the dose back to 0.09  mg/kg/hr after 2 hours off CRRT until we had a plan for CRRT or HD. Based on conversations, test on midodrine to determine possible tolerance and decision to either continue CRRT or transition to HD.  Goal of Therapy:  PTT goal 50-85 sec Monitor platelets by anticoagulation protocol: Yes   Plan:  Decreased bivalirudin to 0.09 mg/kg/hr aPTT in 4 hours   Varney Daily, PharmD PGY1 Pharmacy Resident  Please check AMION for all [facility] pharmacy phone numbers After 10:00 PM call main pharmacy [phone]

## 2021-11-14 NOTE — Progress Notes (Signed)
? ?  Inpatient Rehab Admissions Coordinator : ? ?Per therapy recommendations, patient was screened for CIR candidacy by Nasrin Lanzo RN MSN.  At this time patient appears to be a potential candidate for CIR. I will place a rehab consult per protocol for full assessment. Please call me with any questions. ? ?Bensyn Bornemann RN MSN ?Admissions Coordinator ?[phone] ?  ?

## 2021-11-14 NOTE — Progress Notes (Signed)
Modified Barium Swallow Progress Note  Patient Details  Name: [name] MRN: [id number] Date of Birth: 1950-12-14  Today's Date: 11/14/2021  Modified Barium Swallow completed.  Full report located under Chart Review in the Imaging Section.  Brief recommendations include the following:  Clinical Impression  Pt has a mild oropharyngeal dysphagia, improving after first few boluses after giving her a little time to warm up. She had penetration with nectar thick liquids via spoon initially, but progressed up to thin liquids with a single instance of trace penetration that was shallow and readily cleared with a cued throat clear. Lingual propulsion is a little weak and she has a tendency to spill boluses into her valleculae before she swallows, but no aspiration is observed. She did not want to try much in terms of foods, even purees. She did not consume anything more solid than puree, referencing a burning sensation on her tongue. She would rather start with liquids and this may be a good starting point given her current level of endurance as well. Will start with full liquid diet, ensuring upright positioning as much as possible during PO intake and still maintaining frequent oral care.   Swallow Evaluation Recommendations       SLP Diet Recommendations: Thin liquid   Liquid Administration via: Cup;Straw   Medication Administration: Crushed with puree       Compensations: Slow rate;Small sips/bites   Postural Changes: Seated upright at 90 degrees   Oral Care Recommendations: Oral care BID        Osie Bond., M.A. [place] Pager [phone] Office [phone]  11/14/2021,9:31 AM

## 2021-11-14 NOTE — Progress Notes (Signed)
0700 - Report received from PM RN. All questions answered.  Safety checks performed.  All lines and drips verified. Hand hygiene performed before each pt contact. Pt off of CRRT. 0730 - Pt transported to fluoro for swallow study.  Insulin stopped for study d/t inability to check CBG during study. 0830 - Returned from swallow study.  Mickel Baas, ST cleared pt for thin liquids and a full liquid diet with pills crushed in pudding or applesauce.  Pt excited. 13 - Pt's daughter, [name] arrived to visit.  [name] was upset because pt's legs were out of the bed and she felt like her mother was trying to get out of bed.  [name] also advised that her mother told her no one checked on her throughout the night.  We discussed that her mother's memory wasn't always clear.  For example, yesterday, she didn't remember working with PT shortly after she worked with PT.  This morning, she didn't remember the swallow study shortly after the swallow study.  We also discussed that pt had hourly blood sugar checks and was on CRRT and so the night shift nurse was in the room at least every hour. She advised that she understood; however, she was frustrated with the position her mother was in. [place], PT worked with pt and got her up to the chair with the [place]. Dr. Lynetta Mare rounded.   37 - Pt's daughter, [name], called and wanted to talk to [place].  Nunzio Cory notified. 1100 - Dr. Johnney Ou, nephrology rounded. [place] arrived to speak with pt's family. 1400 - Pt bathed and assisted back to bed. 1415 - Pt w/BM.  Pt cleaned. 15 - Transport arrived and advised they were going to take pt for dialysis.  No dialysis orders.  Transport sent away. [place], pt's daughter, called and updated.  1645 - Pt's dinner arrived. Pt ate approximately 25% of her meal. 1730 - IV Therapy arrived to change pt's TPN bag. 1800 - Pt w BM and cleaned. 1900 - Report given to PM RN.  All questions answered.
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
## 2021-11-14 NOTE — TOC Progression Note (Signed)
Transition of Care [facility]) - Progression Note    Patient Details  Name: [name] MRN: [id number] Date of Birth: 1951/09/19  [facility]) CM/SW Contact  Graves-Bigelow, Ocie Cornfield, RN Phone Number: 11/14/2021, 1:25 PM  Clinical Narrative: Case Manager continues to follow the patient for disposition needs. Case Manager contacted Inpatient Rehab Admissions Coordinator to see if the patient will be a candidate for CIR. Case Manager will continue to follow the patient as she progresses.   Expected Discharge Plan: IP Rehab Facility Barriers to Discharge: Continued Medical Work up  Expected Discharge Plan and Services Expected Discharge Plan: [place] In-house Referral: Clinical Social Work Discharge Planning Services: CM Consult Post Acute Care Choice: [place] arrangements for the past 2 months: Apartment                    Readmission Risk Interventions Readmission Risk Prevention Plan 11/02/2021  Transportation Screening Complete  Some recent data might be hidden

## 2021-11-14 NOTE — Progress Notes (Signed)
Nutrition Follow-up  DOCUMENTATION CODES:   Severe malnutrition in context of acute illness/injury  INTERVENTION:   Goal from nutrition perspective is for pt to demonstrate tolerance of FL diet with hopeful progression to a solid consistency diet soon; ideal situation would be to continue TPN (as tolerated) until diet advanced past FL and pt taking  >60% of nutritional needs by mouth. Platelet level still not at appropriate level for placing Cortrak tube but if this occurs could also consider Cortrak with supplemental TF  Continue TPN -Concentrate TPN as much as possible  Ensure Enlive po BID, each supplement provides 350 kcal and 20 grams of protein  Cater to pt preferences; current diet order options discussed with pt and her daughter  NUTRITION DIAGNOSIS:   Severe Malnutrition related to acute illness as evidenced by moderate muscle depletion, energy intake < or equal to 50% for > or equal to 5 days, mild fat depletion.  Being addressed  GOAL:   Patient will meet greater than or equal to 90% of their needs  Progressing  MONITOR:   Diet advancement, Labs, Weight trends, Other (Comment)  REASON FOR ASSESSMENT:   Ventilator    ASSESSMENT:   71 yo female admitted with acute encephalopathy post OOH prolonged V.fib arrest with multiorgan failure with massive saddle PE and DIC, AKI. PMH includes DM, HTN, HLD, GERD, anxiety, depression, fibromyalgia  1/01 Admitted, VF arrest, Intubated, Concern for anoxic injury 1/02 Saddle PE s/p Mechanical thrombectomy 1/03 Hemodynamics improved post thrombectomy, weaning pressors, Hgb table, bleeding reduced, started trickle TF. Later in day, pressor requirement increased with Hgb drop to around 5 and found to have large intraperitoneal/intraparenchymal hemorrhage extending into left subscapular hematoma. Pt received PRBC x 5, FFP x 5, Platelets x 1 1/05 acute hemorrhage from left hepatic lob s/p Left hepatic lobe embolization, TF resumed  at trickle 1/06 Episode of emesis after TF increased to 65 ml/hr 1/09 TF held all weekend, resuming trickle TF today 1/10 Started to advanced TF pat 10 ml/hr but pt extubated later in the day, OG tube removed 1/11 Cortrak unable to be placed as platelets 5 post transfusion, failed bedside swallow, plan for TPN and hematology consulted, CRRT stopped 1/12 Platelets <5, off CRRT, iHD 1/14 iHD again 1/15 CRRT initiated 1/18 MBS-diet adv to FL  Pt remains very weak; did stand up with PT yesterday; currently working on standing and transfers. OOB to chair today  Diet advanced to FL; no meal tray intake yet. SLP reports pt did very well in MBS and pt has great potential for diet advancement  Off CRRT currently, MD plans to reassess CRRT vs trial iHD tomorrow Noted pt appears to remain anuric. Volume from TPN and FL diet is a concern but given malnutrition, is a necessity at this time  TPN continues at 70 ml/hr providing 1680 mL of fluid, 121 g of protein and 1953 kcals   CBGs >300 today; pt on TPN, started on FL diet-remains on insulin drip  Current wt 67.5 kg; based on weight hx, pt appears to have lost "true" weight in addition to fluid weight. Net negative 21 L per I/O flow sheet; highest wt 99 kg.   No pressure injuries noted per RN skin assessment.   Labs: CBGs 130-335 (ICU goal 140-180)-insulin gtt currently; BUN 47, Creatinine 1.45 Meds: decadron, folic acid, B complex with c   Diet Order:   Diet Order             Diet full liquid Room service  appropriate? Yes with Assist; Fluid consistency: Thin  Diet effective now                   EDUCATION NEEDS:   Not appropriate for education at this time  Skin:  Skin Assessment: Reviewed RN Assessment  Last BM:  1/17  Height:   Ht Readings from Last 1 Encounters:  10/28/21 5\' 5"  (1.651 m)    Weight:   Wt Readings from Last 1 Encounters:  11/14/21 67.5 kg     BMI:  Body mass index is 24.76 kg/m.  Estimated  Nutritional Needs:   Kcal:  1950-2150 kcals  Protein:  100-125 g  Fluid:  >/= 1.8 L   Kerman Passey MS, RDN, LDN, CNSC Registered Dietitian III Clinical Nutrition RD Pager and On-Call Pager Number Located in [place]

## 2021-11-14 NOTE — Progress Notes (Signed)
[place] for bivalirudin Indication: pulmonary embolus Brief A/P: aPTT subtherapeutic  Increase bivalirudin  Allergies  Allergen Reactions   Aspirin Nausea And Vomiting    Other reaction(s): Unknown   Benadryl [Diphenhydramine]     Can only take dye free. States the dye causes itching.   [place] [Propoxyphene] Itching    Can only during the day. Night time makes her itch   Hydrocodone Bit-[place] Mbr Itching   Metformin     Other reaction(s): GI   Other     Other reaction(s): Unknown. States reaction was to nasal spray that caused pupils to shrink   Pentazocine     nervous Other reaction(s): jitters   Percocet [Oxycodone-Acetaminophen]     itching   Sulfa Antibiotics Hives   Tetracaine Hcl     Other reaction(s): Unknown. Thinks caused itching.   Tetracyclines & Related Hives   Tramadol     Other reaction(s): itch    Patient Measurements: Height: 5\' 5"  (165.1 cm) Weight: 75.4 kg (166 lb 3.6 oz) IBW/kg (Calculated) : 57  Vital Signs: Temp: 97.5 F (36.4 C) (01/18 0400) Temp Source: Oral (01/18 0400) BP: 98/58 (01/18 0445) Pulse Rate: 92 (01/18 0445)  Labs: Recent Labs    11/12/21 0432 11/12/21 1630 11/13/21 0406 11/13/21 1557 11/13/21 2209 11/14/21 0423  HGB 10.2*  --  11.6*  --   --   --   HCT 29.7*  --  33.9*  --   --   --   PLT 9*  --  11*  --   --   --   APTT 49* 53* 53* 47* 49* 49*  CREATININE 2.83* 2.25* 2.03* 1.86*  --   --      Estimated Creatinine Clearance: 27.4 mL/min (A) (by C-G formula based on SCr of 1.86 mg/dL (H)).   Assessment: 71 yo female with PE s/p thrombectomy, possible HIT, for bivalirudin.   Goal of Therapy:  PTT goal 50-85 sec Monitor platelets by anticoagulation protocol: Yes   Plan:  Increase bivalirudin to 0.15 mg/kg/hr aPTT in 4 hours  Phillis Knack, PharmD, BCPS

## 2021-11-14 NOTE — Progress Notes (Signed)
[name] Progress Note     Assessment/ Plan:   Renal failure secondary to ATN in the setting of cardiac arrest, hypotension, pressors, contrast.  BL CKD3A with BL creatinine in the 1.17-1.3 range in late 2021. - R fem HD cath - CRRT 10/31/20 - 1/11/ -HD 11/08/21--> had to have pressor with it - HD treatment 11/10/20--> cathether issues- wonder if BFR wasn't achieved--> BUN up, breathing worse, + fluid balance again, AMS so resumed CRRT 1/15 - volume much improved, labs looking good so ok to hold CRRT through day today - BPs are marginal at the moment but she's being started on midodrine - will reassess in the AM re: CRRT vs trial of iHD. D/w family and RN.   Prolonged VF cardiac arrest--> OOH VF arrest 2/2 massive PE  Massive PE s/p mechanical thrombectomy 1/2 of R and L PA.  Vasc US- no DVT, liver mass noted 10/28/21 CT abd/ pelvis, s/p MRI abd--> incompletely characterized, will need to be repeated  Retroperitoneal bleed/Hemorrhagic shock/Acute blood loss anemia originating from left liver lobe s/p embolization 1/4--> required FFP, plts, Vit K, pRBCs  Thrombocytopenia- component of DIC suspected, HIT negative, on bivalirudin and avoiding heparin per heme, getting platelet transfusions PRN, heme involved, suspects ITP, getting Nplate PRN and dex.    DM: insulin per primary  Acute hypoxic RF- extubated 11/07/21, remains on HFNC  Sepsis/ leukocytosis/ aspiration pneumonia- MRI with aspiration PNA.  On CTX and vanc currently  FEN: on TPN still, MBSS results noted 1/18 so going to try po intake.   Dispo: ICU  Subjective:    I/O yest 3.2 / 3.9.  Off CRRT as of this AM Up in a chair - feels good to be out of bed.  Still weak.    Objective:   BP (!) 83/52    Pulse (!) 113    Temp 98 F (36.7 C) (Oral)    Resp 18    Ht 5\' 5"  (1.651 m)    Wt 67.5 kg    SpO2 92%    BMI 24.76 kg/m   Intake/Output Summary (Last 24 hours) at 11/14/2021 1120 Last data filed at 11/14/2021  0600 Gross per 24 hour  Intake 2366.07 ml  Output 2932 ml  Net -565.93 ml    Weight change:   Physical Exam: GEN: NAD, sitting up in chair HEENT:  sclerae anicteric NECK: Supple, no thyromegaly LUNGS: normal WOB on HFNC, coarse ant, no wheezing or rales CV: Reg rate ABD: soft, nontender EXT: edema resolved  Imaging: DG Chest Port 1 View  Result Date: 11/14/2021 CLINICAL DATA:  Pneumonia, history of cardiac arrest, weakness EXAM: PORTABLE CHEST 1 VIEW COMPARISON:  11/12/2021 FINDINGS: The heart size and mediastinal contours are within normal limits. Right neck vascular catheter, tip projecting over the right atrium. Unchanged elevation of the right hemidiaphragm with associated scarring or atelectasis. The visualized skeletal structures are unremarkable. IMPRESSION: Unchanged elevation of the right hemidiaphragm with associated scarring or atelectasis. No new airspace opacity. Electronically Signed   By: Delanna Ahmadi M.D.   On: 11/14/2021 09:15   DG Swallowing Func-Speech Pathology  Result Date: 11/14/2021 Table formatting from the original result was not included. Objective Swallowing Evaluation: Type of Study: MBS-Modified Barium Swallow Study  Patient Details Name: [name] MRN: [id number] Date of Birth: 04/13/1951 Today's Date: 11/14/2021 Time: SLP Start Time (ACUTE ONLY): 0850 -SLP Stop Time (ACUTE ONLY): 0910 SLP Time Calculation (min) (ACUTE ONLY): 20 min Past Medical History: Past Medical  History: Diagnosis Date  Anxiety   Arthritis   Asthma   Cardiac arrest ([place]) 10/28/2021  Cardiac arrest ([place]) 10/28/2021  Depression   Diabetes mellitus without complication (HCC)   Fibromyalgia   GERD (gastroesophageal reflux disease)   Hyperlipemia   Hypertension   PONV (postoperative nausea and vomiting)  Past Surgical History: Past Surgical History: Procedure Laterality Date  COLONOSCOPY    DILATION AND CURETTAGE OF UTERUS    IR EMBO ART  VEN HEMORR LYMPH EXTRAV  INC GUIDE ROADMAPPING  11/01/2021   IR THROMBECT PRIM MECH INIT (IN[facility]) MOD SED  10/29/2021  IR US GUIDE VASC ACCESS RIGHT  10/29/2021  IR US GUIDE VASC ACCESS RIGHT  11/01/2021  KNEE ARTHROSCOPY  2003,2005  left  SHOULDER ARTHROSCOPY    left  TRIGGER FINGER RELEASE  08/13/2012  Procedure: RELEASE TRIGGER FINGER/A-1 PULLEY;  Surgeon: [name]., MD;  Location: [facility];  Service: Orthopedics;  Laterality: Right;  long finger  TUBAL LIGATION   HPI: Pt is a 71 yo female presenting 1/1 after prolonged OOH VF arrest (20 min of CPR, also coded 2x in ED, 4 min and 3 min) secondary to massive PE complicated by DIC. She is s/p thrombectomy on 1/2. Hospital course further complicated by AKI requiring CRRT, hemorrhagic shock due to hepatic bleeding, vomiting wtih TF. PMH includes DM, HTN, HLD, GERD, anxiety, depression, fibromyalgia  Subjective: eager for swallow study  Recommendations for follow up therapy are one component of a multi-disciplinary discharge planning process, led by the attending physician.  Recommendations may be updated based on patient status, additional functional criteria and insurance authorization. Assessment / Plan / Recommendation Clinical Impressions 11/14/2021 Clinical Impression Pt has a mild oropharyngeal dysphagia, improving after first few boluses after giving her a little time to warm up. She had penetration with nectar thick liquids via spoon initially, but progressed up to thin liquids with a single instance of trace penetration that was shallow and readily cleared with a cued throat clear. Lingual propulsion is a little weak and she has a tendency to spill boluses into her valleculae before she swallows, but no aspiration is observed. She did not want to try much in terms of foods, even purees. She did not consume anything more solid than puree, referencing a burning sensation on her tongue. She would rather start with liquids and this may be a good starting point given her current level of endurance as  well. Will start with full liquid diet, ensuring upright positioning as much as possible during PO intake and still maintaining frequent oral care. SLP Visit Diagnosis Dysphagia, unspecified (R13.10) Attention and concentration deficit following -- Frontal lobe and executive function deficit following -- Impact on safety and function Mild aspiration risk   Treatment Recommendations 11/14/2021 Treatment Recommendations Therapy as outlined in treatment plan below   Prognosis 11/14/2021 Prognosis for Safe Diet Advancement Good Barriers to Reach Goals -- Barriers/Prognosis Comment -- Diet Recommendations 11/14/2021 SLP Diet Recommendations Thin liquid Liquid Administration via Cup;Straw Medication Administration Crushed with puree Compensations Slow rate;Small sips/bites Postural Changes Seated upright at 90 degrees   Other Recommendations 11/14/2021 Recommended Consults -- Oral Care Recommendations Oral care BID Other Recommendations -- Follow Up Recommendations Acute inpatient rehab (3hours/day) Assistance recommended at discharge Intermittent Supervision/Assistance Functional Status Assessment Patient has had a recent decline in their functional status and demonstrates the ability to make significant improvements in function in a reasonable and predictable amount of time. Frequency and Duration  11/14/2021 Speech Therapy  Frequency (ACUTE ONLY) min 2x/week Treatment Duration 2 weeks   Oral Phase 11/14/2021 Oral Phase Impaired Oral - Pudding Teaspoon -- Oral - Pudding Cup -- Oral - Honey Teaspoon -- Oral - Honey Cup -- Oral - Nectar Teaspoon Weak lingual manipulation Oral - Nectar Cup -- Oral - Nectar Straw Weak lingual manipulation Oral - Thin Teaspoon -- Oral - Thin Cup Weak lingual manipulation Oral - Thin Straw Weak lingual manipulation Oral - Puree Weak lingual manipulation;Delayed oral transit Oral - Mech Soft -- Oral - Regular -- Oral - Multi-Consistency -- Oral - Pill -- Oral Phase - Comment --  Pharyngeal Phase  11/14/2021 Pharyngeal Phase Impaired Pharyngeal- Pudding Teaspoon -- Pharyngeal -- Pharyngeal- Pudding Cup -- Pharyngeal -- Pharyngeal- Honey Teaspoon -- Pharyngeal -- Pharyngeal- Honey Cup -- Pharyngeal -- Pharyngeal- Nectar Teaspoon Reduced airway/laryngeal closure;Penetration/Aspiration during swallow Pharyngeal Material enters airway, remains ABOVE vocal cords and not ejected out Pharyngeal- Nectar Cup -- Pharyngeal -- Pharyngeal- Nectar Straw WFL Pharyngeal -- Pharyngeal- Thin Teaspoon -- Pharyngeal -- Pharyngeal- Thin Cup WFL Pharyngeal -- Pharyngeal- Thin Straw Reduced airway/laryngeal closure;Penetration/Aspiration during swallow Pharyngeal Material enters airway, remains ABOVE vocal cords and not ejected out Pharyngeal- Puree WFL Pharyngeal -- Pharyngeal- Mechanical Soft -- Pharyngeal -- Pharyngeal- Regular -- Pharyngeal -- Pharyngeal- Multi-consistency -- Pharyngeal -- Pharyngeal- Pill -- Pharyngeal -- Pharyngeal Comment --  Cervical Esophageal Phase  11/14/2021 Cervical Esophageal Phase WFL Pudding Teaspoon -- Pudding Cup -- Honey Teaspoon -- Honey Cup -- Nectar Teaspoon -- Nectar Cup -- Nectar Straw -- Thin Teaspoon -- Thin Cup -- Thin Straw -- Puree -- Mechanical Soft -- Regular -- Multi-consistency -- Pill -- Cervical Esophageal Comment -- Osie Bond., M.A. CCC-SLP Acute Rehabilitation Services Pager [phone] Office [phone] 11/14/2021, 9:34 AM                      Labs: BMET Recent Labs  Lab 11/11/21 0500 11/11/21 1708 11/12/21 0432 11/12/21 1630 11/13/21 0406 11/13/21 1557 11/14/21 0423  NA 138 134* 136 135 134* 134* 135  K 3.3* 3.4* 3.5 4.3 3.9 4.3 3.9  CL 101 103 101 100 97* 99 99  CO2 17* 20* 23 23 23 23 24   GLUCOSE 392* 154* 152* 231* 170* 185* 115*  BUN 148* 107* 78* 66* 61* 58* 47*  CREATININE 5.99* 4.22* 2.83* 2.25* 2.03* 1.86* 1.45*  CALCIUM 8.2* 8.1* 8.5* 8.5* 8.8* 8.3* 8.6*  PHOS 5.3* 3.2 2.1* 1.8* 2.1* 2.9 2.8    CBC Recent Labs  Lab 11/11/21 0500  11/12/21 0432 11/13/21 0406 11/14/21 0423  WBC 38.6* 27.0* 33.1* 33.7*  HGB 9.3* 10.2* 11.6* 11.1*  HCT 26.8* 29.7* 33.9* 34.6*  MCV 85.6 85.6 86.5 88.9  PLT 15* 9* 11* 14*     Medications:     atorvastatin  20 mg Oral Daily   chlorhexidine  15 mL Mouth Rinse BID   Chlorhexidine Gluconate Cloth  6 each Topical Daily   folic acid  2 mg Oral Daily   mouth rinse  15 mL Mouth Rinse BID   mouth rinse  15 mL Mouth Rinse q12n4p   midodrine  5 mg Oral TID WC   pantoprazole (PROTONIX) IV  40 mg Intravenous Q1400    Jannifer Hick MD [facility] Pager [phone]

## 2021-11-14 NOTE — Progress Notes (Signed)
ANTICOAGULATION CONSULT NOTE  Pharmacy Consult for bivalirudin Indication: pulmonary embolus  Allergies  Allergen Reactions   Aspirin Nausea And Vomiting    Other reaction(s): Unknown   Benadryl [Diphenhydramine]     Can only take dye free. States the dye causes itching.   Darvon [Propoxyphene] Itching    Can only during the day. Night time makes her itch   Hydrocodone Bit-Homatrop Mbr Itching   Metformin     Other reaction(s): GI   Other     Other reaction(s): Unknown. States reaction was to nasal spray that caused pupils to shrink   Pentazocine     nervous Other reaction(s): jitters   Percocet [Oxycodone-Acetaminophen]     itching   Sulfa Antibiotics Hives   Tetracaine Hcl     Other reaction(s): Unknown. Thinks caused itching.   Tetracyclines & Related Hives   Tramadol     Other reaction(s): itch    Patient Measurements: Height: 5\' 5"  (165.1 cm) Weight: 67.5 kg (148 lb 13 oz) IBW/kg (Calculated) : 57  Vital Signs: Temp: 98.3 F (36.8 C) (01/18 1534) Temp Source: Oral (01/18 1534) BP: 85/53 (01/18 1730) Pulse Rate: 99 (01/18 1730)  Labs: Recent Labs    11/12/21 0432 11/12/21 1630 11/13/21 0406 11/13/21 1557 11/13/21 2209 11/14/21 0423 11/14/21 1037 11/14/21 1722  HGB 10.2*  --  11.6*  --   --  11.1*  --   --   HCT 29.7*  --  33.9*  --   --  34.6*  --   --   PLT 9*  --  11*  --   --  14*  --   --   APTT 49*   < > 53* 47*   < > 49* 68* 67*  CREATININE 2.83*   < > 2.03* 1.86*  --  1.45*  --   --    < > = values in this interval not displayed.     Estimated Creatinine Clearance: 31.1 mL/min (A) (by C-G formula based on SCr of 1.45 mg/dL (H)).   Assessment: 71 yo female with PE s/p thrombectomy, possible HIT, for bivalirudin. aPTT today therapeutic at 68 on 0.09 mg/kg bilvalirudin. Hemoglobin improved to 11.1 and platelets increased to 14. No bleeding per RN.   Due to subtherapeutic aPTTs overnight, bivalrudin was increased to 0.15 mg/kg/hr; however,  around 0600 the CRRT was stopped. Preemptively, reduced the dose back to 0.09 mg/kg/hr after 2 hours off CRRT until we had a plan for CRRT or HD. Based on conversations, test on midodrine to determine possible tolerance and decision to either continue CRRT or transition to HD.  aPTT this evening on bivalirudin 0.09 mg/kg/hr came back therapeutic at 67. No s/sx of bleeding or infusion issues.   Goal of Therapy:  PTT goal 50-85 sec Monitor platelets by anticoagulation protocol: Yes   Plan:  Continue bivalirudin at 0.09 mg/kg/hr Will monitor aPTT every 12 hours - next level on 1/19@0500   Antonietta Jewel, PharmD, [place] Pharmacist  Phone: [phone] 11/14/2021 6:07 PM  Please check AMION for all [facility] phone numbers After 10:00 PM, call [place] [phone]

## 2021-11-14 NOTE — Progress Notes (Signed)
Physical Therapy Treatment Patient Details Name: [name] MRN: [id number] DOB: Feb 23, 1951 Today's Date: 11/14/2021   History of Present Illness Pt is a 71 y.o. female admitted 10/28/21 after cardiac arrest requiring 20-min CPR for ROSC; ETT in the field. Pt had 2x additional codes in ED requiring CPR for 4 and 2-min. Workup for STEMI, cardiogenic shock, large saddle PE, RUQ bleeding, 2 rib fxs. S/p PE thrombectomy 1/2. Pt with intraperitoneal hemorrhage s/p embolization 1/5. ETT 1/1-1/10. CRRT 1/4-1/11; trialled iHD 1/12; CRRT again 1/15-1/18. PMH includes fibromyalgia, HTN, DM2, CKD, GERD, asthma.   PT Comments    Pt progressing with mobility. Today's session focused on standing tolerance and transfers with stedy standing frame; pt happy to be out of bed, but clearly very fatigued. Pt remains limited by generalized weakness, decreased activity tolerance, poor balance strategies/postural reactions and impaired cognition. Continue to recommend intensive AIR-level therapies to maximize functional mobility and independence prior to return home.  HR up to 120s SpO2 >/90% on 4L O2 [place] Post-standing BP 80/50    Recommendations for follow up therapy are one component of a multi-disciplinary discharge planning process, led by the attending physician.  Recommendations may be updated based on patient status, additional functional criteria and insurance authorization.  Follow Up Recommendations  Acute inpatient rehab (3hours/day)     Assistance Recommended at Discharge Frequent or constant Supervision/Assistance  Patient can return home with the following Two people to help with walking and/or transfers;Assistance with cooking/housework;Assist for transportation;Help with stairs or ramp for entrance;A lot of help with bathing/dressing/bathroom   Equipment Recommendations  TBD - Rolling walker (2 wheels);Wheelchair (measurements PT);Wheelchair cushion (measurements PT)    Recommendations for Other  Services       Precautions / Restrictions Precautions Precaution Comments: L femoral non-tunneled HD cath - MD okayed getting up with fem catheter Restrictions Weight Bearing Restrictions: No     Mobility  Bed Mobility Overal bed mobility: Needs Assistance Bed Mobility: Supine to Sit     Supine to sit: Mod assist     General bed mobility comments: ModA for trunk elevation and scooting hips to EOB, cues to use bed rail    Transfers Overall transfer level: Needs assistance Equipment used: Ambulation equipment used Transfers: Sit to/from Stand, Bed to chair/wheelchair/BSC Sit to Stand: Mod assist, From elevated surface           General transfer comment: Unable to stand on initial attempt with c/o foot pain and tired; elevated bed height, able to stand in stedy frame with modA for trunk elevation; 2x stand from stedy seat with minA, heavy reliance on UE support to pull into standing; mod-maxA for eccentric lower into recliner Transfer via Lift Equipment: Stedy  Ambulation/Gait                   Stairs             Wheelchair Mobility    Modified Rankin (Stroke Patients Only)       Balance Overall balance assessment: Needs assistance Sitting-balance support: Feet supported, No upper extremity supported, Bilateral upper extremity supported Sitting balance-Leahy Scale: Fair Sitting balance - Comments: Static sitting with min guard for safety and lines   Standing balance support: Bilateral upper extremity supported, Reliant on assistive device for balance Standing balance-Leahy Scale: Poor Standing balance comment: Reliant on BUE support and external assist for static standing in stedy frame  Cognition Arousal/Alertness: Awake/alert Behavior During Therapy: WFL for tasks assessed/performed Overall Cognitive Status: Impaired/Different from baseline Area of Impairment: Attention, Following commands,  Safety/judgement, Awareness, Problem solving                   Current Attention Level: Sustained, Selective   Following Commands: Follows one step commands with increased time Safety/Judgement: Decreased awareness of deficits Awareness: Emergent Problem Solving: Slow processing, Decreased initiation, Requires verbal cues          Exercises      General Comments General comments (skin integrity, edema, etc.): pt continues to ask, "can't wait we wait and do this tomorrow" - max encouragement from RN, therapist and daughter ([name]) for participation - pt endorses significant fatigue, understandably      Pertinent Vitals/Pain Pain Assessment Pain Assessment: Faces Faces Pain Scale: Hurts a little bit Pain Location: R foot with WB, BLEs Pain Descriptors / Indicators: Grimacing, Guarding, Sore Pain Intervention(s): Monitored during session, Limited activity within patient's tolerance, Repositioned    Home Living                          Prior Function            PT Goals (current goals can now be found in the care plan section) Progress towards PT goals: Progressing toward goals    Frequency    Min 3X/week      PT Plan Current plan remains appropriate    Co-evaluation              AM-PAC PT "6 Clicks" Mobility   Outcome Measure  Help needed turning from your back to your side while in a flat bed without using bedrails?: A Little Help needed moving from lying on your back to sitting on the side of a flat bed without using bedrails?: A Lot Help needed moving to and from a bed to a chair (including a wheelchair)?: Total Help needed standing up from a chair using your arms (e.g., wheelchair or bedside chair)?: A Lot Help needed to walk in hospital room?: Total Help needed climbing 3-5 steps with a railing? : Total 6 Click Score: 10    End of Session Equipment Utilized During Treatment: Gait belt;Oxygen Activity Tolerance: Patient tolerated  treatment well;Patient limited by fatigue Patient left: in chair;with call bell/phone within reach;with chair alarm set;with family/visitor present;with nursing/sitter in room Nurse Communication: Mobility status;Need for lift equipment PT Visit Diagnosis: Unsteadiness on feet (R26.81);Other abnormalities of gait and mobility (R26.89);Muscle weakness (generalized) (M62.81)     Time: 1040-1104 PT Time Calculation (min) (ACUTE ONLY): 24 min  Charges:  $Therapeutic Activity: 23-37 mins                     Mabeline Caras, PT, DPT Acute Rehabilitation Services  Pager [phone] Office [place] 11/14/2021, 1:51 PM

## 2021-11-14 NOTE — Progress Notes (Signed)
PHARMACY - TOTAL PARENTERAL NUTRITION CONSULT NOTE  Indication:  Intolerance of enteral feeding and unable to obtain enteral access  Patient Measurements: Height: 5\' 5"  (165.1 cm) Weight: 67.5 kg (148 lb 13 oz) IBW/kg (Calculated) : 57   Body mass index is 24.76 kg/m.  Assessment:  65 YOF who was admitted 10/28/21 after cardiac arrest and out of hospital CPR due to large saddle PE. Patient has been intubated and course complicated by uGIB and significantly low platelets, acute hepatic and renal failure.  Unable to place Cortrak due to thrombocytopenia, so Pharmacy consulted to manage TPN.   Glucose / Insulin: hx DM possibly on Lantus 22/d and Prandin 2mg  BID PTA Decadron 20mg  daily continued through 1/19 Switched to vsSSI and Levemir 24 units BID briefly 1/14 PM, back on insulin gtt CBGs 110-150s.  Electrolytes: Na 135, K 3.9 (on prismasol 4/2.5), phos 2.1>> 2.8, Mg 2.6. Other electrolytes wnl.  Renal: BL SCr ~1.1. CRRT >> iHD, last session 1/14 (2.5 hrs d/t poor access flow and clotting) >> CRRT resumed 1/15 with 4/2.5 fluids Hepatic: LFTs elevated and trending down (elevated prior to TPN initiation), tbili 1.9 (sclera jaundice noted by family on RN on 1/16). TG 264, albumin 2.9 Intake / Output; MIVF: net -21.6L, LBM 1/17 GI Imaging: none since start of TPN GI Surgeries / Procedures: none since start of TPN  Central access: Triple lumen CVC placed 10/28/21 (confirmed with RN dedicated lumen available; confirmed with CCM ok to use line for TPN despite age of line; unable to place new line due to AKI and platelets) TPN start date: 11/08/21  Nutritional Goals: RD Estimated Needs Total Energy Estimated Needs: 1950-2150 kcals Total Protein Estimated Needs: 100-125 g Total Fluid Estimated Needs: >/= 1.8 L  Current Nutrition:  TPN  Plan:  Continue concentrated TPN at goal rate 70 mL/hr to provide 121g AA (~1.9g AA based on ABW which is appropriate for CRRT), 252g CHO and 61g ILE for a  total of 1953 kCal, meeting 100% of needs Electrolytes in TPN: Remove Mg. Na 100 mEq/L. Continue K 6 mEq/L (= 10 mEq/d), Ca 2.5 mEq/L, Continue Phos 5 mmol/L. Cl:Ac 1:2 Add standard MVI and trace elements to TPN - remove chromium with RRT needs  Continue 1/2 trace elements due to jaundice reported by family and RN and tbili just below cut off of 2.0 for adjustment with jaundice present Continue insulin drip per primary team while on dexamethasone (last dose scheduled 1/19) Standard TPN labs on Mon/Thurs, repeat electrolytes tomorrow and repeat TG on Thursday for need to adjust lipids  Follow up MBS results (planned for 1/18 per RN) and platelets for ability to have tube placed for enteral feeding  F/u continuation of CRRT for TPN adjustment  Thank you for allowing pharmacy to be a part of this patients care.  Donnald Garre, PharmD Clinical Pharmacist  Please check AMION for all [place] numbers After 10:00 PM, call [place] [phone]

## 2021-11-15 LAB — CBC
HCT: 27.7 % — ABNORMAL LOW (ref 36.0–46.0)
Hemoglobin: 9.4 g/dL — ABNORMAL LOW (ref 12.0–15.0)
MCH: 29.9 pg (ref 26.0–34.0)
MCHC: 33.9 g/dL (ref 30.0–36.0)
MCV: 88.2 fL (ref 80.0–100.0)
Platelets: 12 10*3/uL — CL (ref 150–400)
RBC: 3.14 MIL/uL — ABNORMAL LOW (ref 3.87–5.11)
RDW: 21.1 % — ABNORMAL HIGH (ref 11.5–15.5)
WBC: 26.7 10*3/uL — ABNORMAL HIGH (ref 4.0–10.5)
nRBC: 0.1 % (ref 0.0–0.2)

## 2021-11-15 LAB — RENAL FUNCTION PANEL
Albumin: 3.6 g/dL (ref 3.5–5.0)
Anion gap: 20 — ABNORMAL HIGH (ref 5–15)
BUN: 46 mg/dL — ABNORMAL HIGH (ref 8–23)
CO2: 24 mmol/L (ref 22–32)
Calcium: 9.3 mg/dL (ref 8.9–10.3)
Chloride: 96 mmol/L — ABNORMAL LOW (ref 98–111)
Creatinine, Ser: 1.77 mg/dL — ABNORMAL HIGH (ref 0.44–1.00)
GFR, Estimated: 30 mL/min — ABNORMAL LOW (ref >60)
Glucose, Bld: 183 mg/dL — ABNORMAL HIGH (ref 70–99)
Phosphorus: 2.7 mg/dL (ref 2.5–4.6)
Potassium: 3.7 mmol/L (ref 3.5–5.1)
Sodium: 140 mmol/L (ref 135–145)

## 2021-11-15 LAB — MAGNESIUM: Magnesium: 2.1 mg/dL (ref 1.7–2.4)

## 2021-11-15 LAB — APTT
aPTT: 69 seconds — ABNORMAL HIGH (ref 24–36)
aPTT: 70 seconds — ABNORMAL HIGH (ref 24–36)

## 2021-11-15 LAB — COMPREHENSIVE METABOLIC PANEL
ALT: 101 U/L — ABNORMAL HIGH (ref 0–44)
AST: 107 U/L — ABNORMAL HIGH (ref 15–41)
Albumin: 2.3 g/dL — ABNORMAL LOW (ref 3.5–5.0)
Alkaline Phosphatase: 222 U/L — ABNORMAL HIGH (ref 38–126)
Anion gap: 16 — ABNORMAL HIGH (ref 5–15)
BUN: 133 mg/dL — ABNORMAL HIGH (ref 8–23)
CO2: 19 mmol/L — ABNORMAL LOW (ref 22–32)
Calcium: 8.2 mg/dL — ABNORMAL LOW (ref 8.9–10.3)
Chloride: 96 mmol/L — ABNORMAL LOW (ref 98–111)
Creatinine, Ser: 3.76 mg/dL — ABNORMAL HIGH (ref 0.44–1.00)
GFR, Estimated: 12 mL/min — ABNORMAL LOW (ref >60)
Glucose, Bld: 186 mg/dL — ABNORMAL HIGH (ref 70–99)
Potassium: 4.3 mmol/L (ref 3.5–5.1)
Sodium: 131 mmol/L — ABNORMAL LOW (ref 135–145)
Total Bilirubin: 1.5 mg/dL — ABNORMAL HIGH (ref 0.3–1.2)
Total Protein: 5.8 g/dL — ABNORMAL LOW (ref 6.5–8.1)

## 2021-11-15 LAB — GLUCOSE, CAPILLARY
Glucose-Capillary: 100 mg/dL — ABNORMAL HIGH (ref 70–99)
Glucose-Capillary: 107 mg/dL — ABNORMAL HIGH (ref 70–99)
Glucose-Capillary: 159 mg/dL — ABNORMAL HIGH (ref 70–99)
Glucose-Capillary: 162 mg/dL — ABNORMAL HIGH (ref 70–99)
Glucose-Capillary: 163 mg/dL — ABNORMAL HIGH (ref 70–99)
Glucose-Capillary: 163 mg/dL — ABNORMAL HIGH (ref 70–99)
Glucose-Capillary: 174 mg/dL — ABNORMAL HIGH (ref 70–99)
Glucose-Capillary: 182 mg/dL — ABNORMAL HIGH (ref 70–99)
Glucose-Capillary: 202 mg/dL — ABNORMAL HIGH (ref 70–99)

## 2021-11-15 LAB — ADAMTS13 ACTIVITY REFLEX

## 2021-11-15 LAB — ADAMTS13 ACTIVITY: Adamts 13 Activity: 61.8 % — ABNORMAL LOW (ref >66.8)

## 2021-11-15 LAB — TRIGLYCERIDES: Triglycerides: 154 mg/dL — ABNORMAL HIGH (ref <150)

## 2021-11-15 LAB — PHOSPHORUS: Phosphorus: 5.9 mg/dL — ABNORMAL HIGH (ref 2.5–4.6)

## 2021-11-15 MED ORDER — LIDOCAINE HCL (PF) 1 % IJ SOLN: 5.0000 mL | INTRAMUSCULAR | Status: DC | PRN

## 2021-11-15 MED ORDER — ALTEPLASE 2 MG IJ SOLR: 2.0000 mg | Freq: Once | INTRAMUSCULAR | Status: DC | PRN

## 2021-11-15 MED ORDER — INSULIN ASPART 100 UNIT/ML IJ SOLN
0.0000 [IU] | INTRAMUSCULAR | Status: DC
  Administered 2021-11-15: 3 [IU] via SUBCUTANEOUS
  Administered 2021-11-15: 15 [IU] via SUBCUTANEOUS

## 2021-11-15 MED ORDER — SODIUM CHLORIDE 0.9 % IV SOLN: 100.0000 mL | INTRAVENOUS | Status: DC | PRN

## 2021-11-15 MED ORDER — CHLORHEXIDINE GLUCONATE CLOTH 2 % EX PADS
6.0000 | MEDICATED_PAD | Freq: Every day | CUTANEOUS | Status: DC
  Administered 2021-11-16 – 2021-11-20 (×4): 6 via TOPICAL

## 2021-11-15 MED ORDER — INSULIN DETEMIR 100 UNIT/ML ~~LOC~~ SOLN
20.0000 [IU] | Freq: Two times a day (BID) | SUBCUTANEOUS | Status: DC
  Administered 2021-11-15 (×2): 20 [IU] via SUBCUTANEOUS
  Filled 2021-11-15 (×5): qty 0.2

## 2021-11-15 MED ORDER — TRACE MINERALS CU-MN-SE-ZN 300-55-60-3000 MCG/ML IV SOLN
INTRAVENOUS | Status: DC
  Filled 2021-11-15: qty 728

## 2021-11-15 MED ORDER — ALBUMIN HUMAN 25 % IV SOLN
INTRAVENOUS | Status: AC
  Administered 2021-11-15: 25 g
  Filled 2021-11-15: qty 100

## 2021-11-15 MED ORDER — ALBUMIN HUMAN 25 % IV SOLN
25.0000 g | Freq: Once | INTRAVENOUS | Status: AC
Start: 2021-11-15 — End: 2021-11-16
  Administered 2021-11-15: 25 g via INTRAVENOUS
  Filled 2021-11-15: qty 100

## 2021-11-15 MED ORDER — PENTAFLUOROPROP-TETRAFLUOROETH EX AERO: 1.0000 "application " | INHALATION_SPRAY | CUTANEOUS | Status: DC | PRN

## 2021-11-15 MED ORDER — HEPARIN SODIUM (PORCINE) 1000 UNIT/ML IJ SOLN
INTRAMUSCULAR | Status: AC
  Administered 2021-11-15: 1000 [IU]
  Filled 2021-11-15: qty 4

## 2021-11-15 MED ORDER — DIPHENHYDRAMINE HCL 12.5 MG/5ML PO ELIX
12.5000 mg | ORAL_SOLUTION | Freq: Four times a day (QID) | ORAL | Status: DC | PRN
  Administered 2021-11-26 – 2021-11-30 (×2): 12.5 mg via ORAL
  Filled 2021-11-15 (×4): qty 5

## 2021-11-15 MED ORDER — LIDOCAINE-PRILOCAINE 2.5-2.5 % EX CREA: 1.0000 "application " | TOPICAL_CREAM | CUTANEOUS | Status: DC | PRN

## 2021-11-15 NOTE — Progress Notes (Signed)
Speech Language Pathology Treatment: Dysphagia  Patient Details Name: [name] MRN: [id number] DOB: 17-Sep-1951 Today's Date: 11/15/2021 Time: 6144-3154 SLP Time Calculation (min) (ACUTE ONLY): 22 min  Assessment / Plan / Recommendation Clinical Impression  Pt lethargic upon SLP arrival, but consented to PO trials to assess diet tolerance and readiness for advancement. With [facility] elevated in reverse Trendelenburg position, pt self-fed consecutive sips of thin liquids without overt s/sx of aspiration. Pt required consistent verbal cueing for labial seal around teaspoon during presentation of pureed bolus x3. Weak oral manipulation suspected as seen during MBS (1/18), however pureed bolus was completely cleared across all attempts without overt s/sx of aspiration. At end of session, clinician recommended advancement to dys 1 diet considering MBS results and today's clinical presentation. Pt reported some continued burning sensation with consumption of solids, but is willing to trial advancing to dys 1 diet. Discussed with daughter and RN present in room. SLP to f/u for tolerance.   HPI HPI: Pt is a 71 yo female presenting 1/1 after prolonged OOH VF arrest (20 min of CPR, also coded 2x in ED, 4 min and 3 min) secondary to massive PE complicated by DIC. She is s/p thrombectomy on 1/2. Hospital course further complicated by AKI requiring CRRT, hemorrhagic shock due to hepatic bleeding, vomiting wtih TF. PMH includes DM, HTN, HLD, GERD, anxiety, depression, fibromyalgia      SLP Plan  Continue with current plan of care      Recommendations for follow up therapy are one component of a multi-disciplinary discharge planning process, led by the attending physician.  Recommendations may be updated based on patient status, additional functional criteria and insurance authorization.    Recommendations  Diet recommendations: Dysphagia 1 (puree);Thin liquid Liquids provided via: Cup;Straw Medication  Administration: Crushed with puree Supervision: Staff to assist with self feeding;Full supervision/cueing for compensatory strategies Compensations: Slow rate;Small sips/bites;Clear throat intermittently Postural Changes and/or Swallow Maneuvers:  (seated upright as much as possible)                Oral Care Recommendations: Oral care BID Follow Up Recommendations: Acute inpatient rehab (3hours/day) Assistance recommended at discharge: Intermittent Supervision/Assistance SLP Visit Diagnosis: Dysphagia, unspecified (R13.10) Plan: Continue with current plan of care          Ellwood Dense, [place], [address] Office Number: [place]  11/15/2021, 11:41 AM

## 2021-11-15 NOTE — Progress Notes (Signed)
OT Cancellation Note  Patient Details Name: [name] MRN: [id number] DOB: [birth year]   Cancelled Treatment:    Reason Eval/Treat Not Completed: Patient at procedure or test/ unavailable (Dialysis. Will return as schedule allows.)  [place], OTR/L Acute Rehab Pager: [phone] Office: [phone] 11/15/2021, 1:46 PM
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
## 2021-11-15 NOTE — Progress Notes (Signed)
PHARMACY - TOTAL PARENTERAL NUTRITION CONSULT NOTE  Indication:  Intolerance of enteral feeding and unable to obtain enteral access  Patient Measurements: Height: 5\' 5"  (165.1 cm) Weight: 70.3 kg (154 lb 15.7 oz) IBW/kg (Calculated) : 57   Body mass index is 25.79 kg/m.  Assessment:  23 YOF who was admitted 10/28/21 after cardiac arrest and out of hospital CPR due to large saddle PE. Patient has been intubated and course complicated by uGIB and significantly low platelets, acute hepatic and renal failure. Unable to place Cortrak due to thrombocytopenia, so Pharmacy consulted to manage TPN.   Glucose / Insulin: hx DM possibly on Lantus 22/d and Prandin 2mg  BID PTA Decadron 20mg  daily continued through 1/19 (Ends today) Switched to vsSSI and Levemir 24 units BID briefly 1/14 PM, back on insulin gtt>>transitioning to SQ today along with thin liquid diet CBGs 80-170s.  Electrolytes: Na 131, K 4.3 (transitioning to iHD today), phos 5.9, Mg 2.3. Other electrolytes wnl.  Renal: BL SCr ~1.1. CRRT >> iHD today Hepatic: LFTs elevated slightly(elevated prior to TPN initiation), tbili 1.5 (sclera jaundice noted by family on RN on 1/16). TG 154, albumin 2.3 Intake / Output; MIVF: net -23.6L, LBM 1/18 GI Imaging: none since start of TPN GI Surgeries / Procedures: none since start of TPN  Central access: Triple lumen CVC placed 10/28/21 (confirmed with RN dedicated lumen available; confirmed with CCM ok to use line for TPN despite age of line; unable to place new line due to AKI and platelets) TPN start date: 11/08/21  Nutritional Goals: RD Estimated Needs Total Energy Estimated Needs: 1950-2150 kcals Total Protein Estimated Needs: 100-125 g Total Fluid Estimated Needs: >/= 1.8 L  Current Nutrition:  TPN Thin Liquid diet  Plan:  Continue concentrated TPN at goal rate 70 mL/hr. Will decrease to 109g AA (~1.5g AA based on ABW as now on iHD), 252g CHO and 61g ILE for a total of 1905 kCal, meeting 98%  of needs along with thin liquid diet Electrolytes in TPN: Mg 0 mEq/L. Na 100 mEq/L. Remove K from bag, Ca 2.5 mEq/L, Remove Phos from bag, Cl:Ac 1:2 Add standard MVI and trace elements to TPN - remove chromium with RRT needs  Continue 1/2 trace elements due to jaundice reported by family and RN and tbili just below cut off of 2.0 for adjustment with jaundice present Steroids completing today, planning to transition insulin drip to SSI today Now on thin liquid diet, continuing current rate until tolerating diet Standard TPN labs on Mon/Thurs, repeat electrolytes tomorrow and repeat TG on Thursday for need to adjust lipids  Follow up platelets for ability to have tube placed for enteral feeding if needed. Transitioning from CRRT to IHD today  Thank you for allowing pharmacy to be a part of this patients care.  Donnald Garre, PharmD Clinical Pharmacist  Please check AMION for all [place] numbers After 10:00 PM, call [place] [phone]

## 2021-11-15 NOTE — Progress Notes (Signed)
Inpatient Rehabilitation Admissions Coordinator   Noted to transition to IHD today. I will follow up to consult for rehab needs.  Danne Baxter, RN, MSN Rehab Admissions Coordinator [phone] 11/15/2021 11:56 AM

## 2021-11-15 NOTE — Progress Notes (Signed)
ANTICOAGULATION CONSULT NOTE  Pharmacy Consult for bivalirudin Indication: pulmonary embolus  Allergies  Allergen Reactions   Aspirin Nausea And Vomiting    Other reaction(s): Unknown   Benadryl [Diphenhydramine]     Can only take dye free. States the dye causes itching.   Darvon [Propoxyphene] Itching    Can only during the day. Night time makes her itch   Hydrocodone Bit-[place]omatrop Mbr Itching   Metformin     Other reaction(s): GI   Other     Other reaction(s): Unknown. States reaction was to nasal spray that caused pupils to shrink   Pentazocine     nervous Other reaction(s): jitters   Percocet [Oxycodone-Acetaminophen]     itching   Sulfa Antibiotics Hives   Tetracaine Hcl     Other reaction(s): Unknown. Thinks caused itching.   Tetracyclines & Related Hives   Tramadol     Other reaction(s): itch    Patient Measurements: Height: 5\' 5"  (165.1 cm) Weight: 70.3 kg (154 lb 15.7 oz) IBW/kg (Calculated) : 57  Vital Signs: Temp: 97.9 F (36.6 C) (01/18 2328) Temp Source: Oral (01/18 2328) BP: 132/61 (01/19 0300) Pulse Rate: 78 (01/19 0300)  Labs: Recent Labs    11/13/21 0406 11/13/21 1557 11/14/21 0423 11/14/21 1037 11/14/21 1722 11/15/21 0407  HGB 11.6*  --  11.1*  --   --  9.4*  HCT 33.9*  --  34.6*  --   --  27.7*  PLT 11*  --  14*  --   --  12*  APTT 53*   < > 49* 68* 67* 69*  CREATININE 2.03*   < > 1.45*  --  2.81* 3.76*   < > = values in this interval not displayed.     Estimated Creatinine Clearance: 13.1 mL/min (A) (by C-G formula based on SCr of 3.76 mg/dL (H)).   Assessment: 71 yo female with PE s/p thrombectomy, possible HIT, on bivalirudin. aPTT today therapeutic at 69 on 0.09 mg/kg bilvalirudin.   Currently off CRRT. H/H down. Plt remains low at 12. No overt s/s of bleeding per RN   Goal of Therapy:  PTT goal 50-85 sec Monitor platelets by anticoagulation protocol: Yes   Plan:  Continue bivalirudin at 0.09 mg/kg/hr Will monitor aPTT  every 12 hours - next level on 1/19@1700   Albertina Parr, PharmD., BCPS, BCCCP Clinical Pharmacist Please refer to [facility] for unit-specific pharmacist

## 2021-11-15 NOTE — Progress Notes (Signed)
PCCM Interval Progress Note  Consulted IR 1/19 for possible Permcath placement in the upcoming week. Complicated patient s/p cardiac arrest in the setting of massive PE, anticoagulated on bivalrudin c/b DIC with current Plt 12K. Renal failure receiving HD via femoral HD cath. Temp HD cath is currently limiting mobility, therefore wanted to discuss Permcath placement feasibility/timing.  Narda Rutherford, NP with Radiology contacted me and we discussed patient. She advised that for any IR procedure, goal Plt count is > 20K with some procedures requiring > 30K (Radiologist preference). For [name], they feel the only safe option with Plt count of 12K is a temporary IJ HD catheter.   Given our ability to do this in the ICU and patient's eventual need for long-term stable HD access, will hold off on temp IJ placement for now and focus on bolstering patient's Plt count with goal for Permcath early next week if Plt count improved.   Appreciate IR assistance and recommendations.  Lestine Mount, PA-C [facility] 11/15/21 4:14 PM  Please see [email or web address] for pager details.  From 7A-7P if no response, please call [phone] After hours, please call ELink [phone]

## 2021-11-15 NOTE — Progress Notes (Signed)
[facility] Progress Note     Assessment/ Plan:   Renal failure secondary to ATN in the setting of cardiac arrest, hypotension, pressors, contrast.  BL CKD3A with BL creatinine in the 1.17-1.3 range in late 2021. - R fem HD cath - daughter tells me plans to switch to IJ, would be great - should be tunneled - CRRT 10/31/20 - 11/07/21 -HD 11/08/21--> had to have pressor with it - HD treatment 11/10/20--> cathether issues- wonder if BFR wasn't achieved--> BUN up, breathing worse, + fluid balance again, AMS so resumed CRRT 1/15 -18 - CRRT off since yest AM - BP looks great now, plan iHD today no heparin UF goal 3L as tolerated; her daily fluid intake is quite high with TPN and meds.   Prolonged VF cardiac arrest--> OOH VF arrest 2/2 massive PE  Massive PE s/p mechanical thrombectomy 1/2 of R and L PA.  Vasc US- no DVT, liver mass noted 10/28/21 CT abd/ pelvis, s/p MRI abd--> incompletely characterized, will need to be repeated  Retroperitoneal bleed/Hemorrhagic shock/Acute blood loss anemia originating from left liver lobe s/p embolization 1/4--> required FFP, plts, Vit K, pRBCs; now stable  Thrombocytopenia- component of DIC suspected, HIT negative, on bivalirudin and avoiding heparin per heme, getting platelet transfusions PRN, heme involved, suspects ITP, getting Nplate PRN and dex.  Would need plt transfusion for line change.   DM: insulin per primary  Acute hypoxic RF- extubated 11/07/21, remains on HFNC  Sepsis/ leukocytosis/ aspiration pneumonia- MRI with aspiration PNA.  On CTX and vanc currently  FEN: on TPN still, MBSS results noted 1/18 so going to try po intake.   Dispo: ICU  Subjective:    I/O yest 2.7 / 0  Doing ok this AM - no new issues   Objective:   BP 121/62 (BP Location: Right Arm)    Pulse 71    Temp (!) 97.5 F (36.4 C) (Oral)    Resp 10    Ht 5\' 5"  (1.651 m)    Wt 70.3 kg    SpO2 95%    BMI 25.79 kg/m   Intake/Output Summary (Last 24 hours) at  11/15/2021 1157 Last data filed at 11/15/2021 0800 Gross per 24 hour  Intake 2402.57 ml  Output --  Net 2402.57 ml    Weight change: 2.8 kg  Physical Exam: GEN: NAD,lying in bed HEENT:  sclerae anicteric NECK: Supple, no thyromegaly LUNGS: normal WOB on HFNC, coarse ant, no wheezing or rales CV: Reg rate ABD: soft, nontender EXT: edema resolved  Imaging: DG Chest Port 1 View  Result Date: 11/14/2021 CLINICAL DATA:  Pneumonia, history of cardiac arrest, weakness EXAM: PORTABLE CHEST 1 VIEW COMPARISON:  11/12/2021 FINDINGS: The heart size and mediastinal contours are within normal limits. Right neck vascular catheter, tip projecting over the right atrium. Unchanged elevation of the right hemidiaphragm with associated scarring or atelectasis. The visualized skeletal structures are unremarkable. IMPRESSION: Unchanged elevation of the right hemidiaphragm with associated scarring or atelectasis. No new airspace opacity. Electronically Signed   By: Delanna Ahmadi M.D.   On: 11/14/2021 09:15   DG Swallowing Func-Speech Pathology  Result Date: 11/14/2021 Table formatting from the original result was not included. Objective Swallowing Evaluation: Type of Study: MBS-Modified Barium Swallow Study  Patient Details Name: [name] MRN: [id number] Date of Birth: 08-27-1951 Today's Date: 11/14/2021 Time: SLP Start Time (ACUTE ONLY): 0850 -SLP Stop Time (ACUTE ONLY): 0910 SLP Time Calculation (min) (ACUTE ONLY): 20 min Past Medical History: Past  Medical History: Diagnosis Date  Anxiety   Arthritis   Asthma   Cardiac arrest ([place]) 10/28/2021  Cardiac arrest ([place]) 10/28/2021  Depression   Diabetes mellitus without complication (HCC)   Fibromyalgia   GERD (gastroesophageal reflux disease)   Hyperlipemia   Hypertension   PONV (postoperative nausea and vomiting)  Past Surgical History: Past Surgical History: Procedure Laterality Date  COLONOSCOPY    DILATION AND CURETTAGE OF UTERUS    IR EMBO ART  VEN HEMORR LYMPH  EXTRAV  INC GUIDE ROADMAPPING  11/01/2021  IR THROMBECT PRIM MECH INIT (INCLU) MOD SED  10/29/2021  IR US GUIDE VASC ACCESS RIGHT  10/29/2021  IR US GUIDE VASC ACCESS RIGHT  11/01/2021  KNEE ARTHROSCOPY  2003,2005  left  SHOULDER ARTHROSCOPY    left  TRIGGER FINGER RELEASE  08/13/2012  Procedure: RELEASE TRIGGER FINGER/A-1 PULLEY;  Surgeon: [name]., MD;  Location: [place];  Service: Orthopedics;  Laterality: Right;  long finger  TUBAL LIGATION   HPI: Pt is a 71 yo female presenting 1/1 after prolonged OOH VF arrest (20 min of CPR, also coded 2x in ED, 4 min and 3 min) secondary to massive PE complicated by DIC. She is s/p thrombectomy on 1/2. Hospital course further complicated by AKI requiring CRRT, hemorrhagic shock due to hepatic bleeding, vomiting wtih TF. PMH includes DM, HTN, HLD, GERD, anxiety, depression, fibromyalgia  Subjective: eager for swallow study  Recommendations for follow up therapy are one component of a multi-disciplinary discharge planning process, led by the attending physician.  Recommendations may be updated based on patient status, additional functional criteria and insurance authorization. Assessment / Plan / Recommendation Clinical Impressions 11/14/2021 Clinical Impression Pt has a mild oropharyngeal dysphagia, improving after first few boluses after giving her a little time to warm up. She had penetration with nectar thick liquids via spoon initially, but progressed up to thin liquids with a single instance of trace penetration that was shallow and readily cleared with a cued throat clear. Lingual propulsion is a little weak and she has a tendency to spill boluses into her valleculae before she swallows, but no aspiration is observed. She did not want to try much in terms of foods, even purees. She did not consume anything more solid than puree, referencing a burning sensation on her tongue. She would rather start with liquids and this may be a good starting point  given her current level of endurance as well. Will start with full liquid diet, ensuring upright positioning as much as possible during PO intake and still maintaining frequent oral care. SLP Visit Diagnosis Dysphagia, unspecified (R13.10) Attention and concentration deficit following -- Frontal lobe and executive function deficit following -- Impact on safety and function Mild aspiration risk   Treatment Recommendations 11/14/2021 Treatment Recommendations Therapy as outlined in treatment plan below   Prognosis 11/14/2021 Prognosis for Safe Diet Advancement Good Barriers to Reach Goals -- Barriers/Prognosis Comment -- Diet Recommendations 11/14/2021 SLP Diet Recommendations Thin liquid Liquid Administration via Cup;Straw Medication Administration Crushed with puree Compensations Slow rate;Small sips/bites Postural Changes Seated upright at 90 degrees   Other Recommendations 11/14/2021 Recommended Consults -- Oral Care Recommendations Oral care BID Other Recommendations -- Follow Up Recommendations Acute inpatient rehab (3hours/day) Assistance recommended at discharge Intermittent Supervision/Assistance Functional Status Assessment Patient has had a recent decline in their functional status and demonstrates the ability to make significant improvements in function in a reasonable and predictable amount of time. Frequency and Duration  11/14/2021 Speech  Therapy Frequency (ACUTE ONLY) min 2x/week Treatment Duration 2 weeks   Oral Phase 11/14/2021 Oral Phase Impaired Oral - Pudding Teaspoon -- Oral - Pudding Cup -- Oral - Honey Teaspoon -- Oral - Honey Cup -- Oral - Nectar Teaspoon Weak lingual manipulation Oral - Nectar Cup -- Oral - Nectar Straw Weak lingual manipulation Oral - Thin Teaspoon -- Oral - Thin Cup Weak lingual manipulation Oral - Thin Straw Weak lingual manipulation Oral - Puree Weak lingual manipulation;Delayed oral transit Oral - Mech Soft -- Oral - Regular -- Oral - Multi-Consistency -- Oral - Pill -- Oral  Phase - Comment --  Pharyngeal Phase 11/14/2021 Pharyngeal Phase Impaired Pharyngeal- Pudding Teaspoon -- Pharyngeal -- Pharyngeal- Pudding Cup -- Pharyngeal -- Pharyngeal- Honey Teaspoon -- Pharyngeal -- Pharyngeal- Honey Cup -- Pharyngeal -- Pharyngeal- Nectar Teaspoon Reduced airway/laryngeal closure;Penetration/Aspiration during swallow Pharyngeal Material enters airway, remains ABOVE vocal cords and not ejected out Pharyngeal- Nectar Cup -- Pharyngeal -- Pharyngeal- Nectar Straw WFL Pharyngeal -- Pharyngeal- Thin Teaspoon -- Pharyngeal -- Pharyngeal- Thin Cup WFL Pharyngeal -- Pharyngeal- Thin Straw Reduced airway/laryngeal closure;Penetration/Aspiration during swallow Pharyngeal Material enters airway, remains ABOVE vocal cords and not ejected out Pharyngeal- Puree WFL Pharyngeal -- Pharyngeal- Mechanical Soft -- Pharyngeal -- Pharyngeal- Regular -- Pharyngeal -- Pharyngeal- Multi-consistency -- Pharyngeal -- Pharyngeal- Pill -- Pharyngeal -- Pharyngeal Comment --  Cervical Esophageal Phase  11/14/2021 Cervical Esophageal Phase WFL Pudding Teaspoon -- Pudding Cup -- Honey Teaspoon -- Honey Cup -- Nectar Teaspoon -- Nectar Cup -- Nectar Straw -- Thin Teaspoon -- Thin Cup -- Thin Straw -- Puree -- Mechanical Soft -- Regular -- Multi-consistency -- Pill -- Cervical Esophageal Comment -- Osie Bond., M.A. CCC-SLP Acute Rehabilitation Services Pager [phone] Office [phone] 11/14/2021, 9:34 AM                      Labs: BMET Recent Labs  Lab 11/12/21 0432 11/12/21 1630 11/13/21 0406 11/13/21 1557 11/14/21 0423 11/14/21 1722 11/15/21 0407  NA 136 135 134* 134* 135 132* 131*  K 3.5 4.3 3.9 4.3 3.9 4.8 4.3  CL 101 100 97* 99 99 97* 96*  CO2 23 23 23 23 24  20* 19*  GLUCOSE 152* 231* 170* 185* 115* 214* 186*  BUN 78* 66* 61* 58* 47* 98* 133*  CREATININE 2.83* 2.25* 2.03* 1.86* 1.45* 2.81* 3.76*  CALCIUM 8.5* 8.5* 8.8* 8.3* 8.6* 8.4* 8.2*  PHOS 2.1* 1.8* 2.1* 2.9 2.8 4.2 5.9*    CBC Recent  Labs  Lab 11/12/21 0432 11/13/21 0406 11/14/21 0423 11/15/21 0407  WBC 27.0* 33.1* 33.7* 26.7*  HGB 10.2* 11.6* 11.1* 9.4*  HCT 29.7* 33.9* 34.6* 27.7*  MCV 85.6 86.5 88.9 88.2  PLT 9* 11* 14* 12*     Medications:     atorvastatin  20 mg Oral Daily   chlorhexidine  15 mL Mouth Rinse BID   Chlorhexidine Gluconate Cloth  6 each Topical Daily   Chlorhexidine Gluconate Cloth  6 each Topical Q0600   feeding supplement  237 mL Oral BID BM   folic acid  2 mg Oral Daily   heparin sodium (porcine)       mouth rinse  15 mL Mouth Rinse BID   mouth rinse  15 mL Mouth Rinse q12n4p   midodrine  5 mg Oral TID WC   pantoprazole (PROTONIX) IV  40 mg Intravenous Q1400    Jannifer Hick MD [facility] Pager [phone]

## 2021-11-15 NOTE — Progress Notes (Signed)
ANTICOAGULATION CONSULT NOTE  Pharmacy Consult for bivalirudin Indication: pulmonary embolus  Allergies  Allergen Reactions   Aspirin Nausea And Vomiting    Other reaction(s): Unknown   Benadryl [Diphenhydramine]     Can only take dye free. States the dye causes itching.   Darvon [Propoxyphene] Itching    Can only during the day. Night time makes her itch   Hydrocodone Bit-Homatrop Mbr Itching   Metformin     Other reaction(s): GI   Other     Other reaction(s): Unknown. States reaction was to nasal spray that caused pupils to shrink   Pentazocine     nervous Other reaction(s): jitters   Percocet [Oxycodone-Acetaminophen]     itching   Sulfa Antibiotics Hives   Tetracaine Hcl     Other reaction(s): Unknown. Thinks caused itching.   Tetracyclines & Related Hives   Tramadol     Other reaction(s): itch    Patient Measurements: Height: 5\' 5"  (165.1 cm) Weight: 68 kg (149 lb 14.6 oz) IBW/kg (Calculated) : 57  Vital Signs: Temp: 97.9 F (36.6 C) (01/19 1555) Temp Source: Oral (01/19 1555) BP: 91/48 (01/19 1630) Pulse Rate: 89 (01/19 1630)  Labs: Recent Labs    11/13/21 0406 11/13/21 1557 11/14/21 0423 11/14/21 1037 11/14/21 1722 11/15/21 0407 11/15/21 1601  HGB 11.6*  --  11.1*  --   --  9.4*  --   HCT 33.9*  --  34.6*  --   --  27.7*  --   PLT 11*  --  14*  --   --  12*  --   APTT 53*   < > 49*   < > 67* 69* 70*  CREATININE 2.03*   < > 1.45*  --  2.81* 3.76* 1.77*   < > = values in this interval not displayed.     Estimated Creatinine Clearance: 25.5 mL/min (A) (by C-G formula based on SCr of 1.77 mg/dL (H)).   Assessment: 71 yo female with PE s/p thrombectomy, possible HIT, on bivalirudin.   aPTT tonight came back therapeutic at 70 on 0.09 mg/kg bilvalirudin. Currently off CRRT. H/H down. Plt remains low at 12. No s/sx of bleeding or infusion issues.   Goal of Therapy:  PTT goal 50-85 sec Monitor platelets by anticoagulation protocol: Yes   Plan:   Continue bivalirudin at 0.09 mg/kg/hr Will monitor aPTT every 12 hours - next level on 1/20@0500   Antonietta Jewel, PharmD, [place] Pharmacist  Phone: [phone] 11/15/2021 5:23 PM  Please check AMION for all [place] phone numbers After 10:00 PM, call [place] [phone]

## 2021-11-15 NOTE — Consult Note (Signed)
° °  [facility] Inpatient Consult   11/15/2021  NALAH MACIOCE [birth year] [id number]  [place] [facility] [ACO] Patient: [employer] Medicare PPO  LLOS and High risk review:  Primary Care Provider:  [name], MD, [place], providers are listed for the [facility] follow up   Patient screened for hospitalization with noted high risk score for unplanned readmission risk and to assess for lenght of stay and remains in the ICU level of care.  .  Review of patient's medical record reveals patient is currently being recommended for an inpatient rehabilitation transition noted.   Plan:  Continue to follow progress and disposition to assess for post hospital care management needs.    For questions contact:   Natividad Brood, RN BSN [facility] Liaison  [phone] business mobile phone Toll free office [phone]  Fax number: [phone] [email or web address]

## 2021-11-15 NOTE — Progress Notes (Signed)
Request for tunneled HD catheter was not approved by IR attending but offered temporary IJ access. Colletta [place], [place] state that IJ can be placed at bedside by their CC team. She adds that the tunneled HD cath request is not urgent since pt has existing temp femoral catheter. It was communicated to PA per IR guidelines, platelet count should be >20K. Current pt platelet count 12K. CC team will monitor platelets and place order for tunneled cath when pt is more stable and the risk for bleeding is reduced.    Narda Rutherford, AGNP-BC 11/15/2021, 4:23 PM

## 2021-11-16 ENCOUNTER — Inpatient Hospital Stay (HOSPITAL_COMMUNITY): Payer: Medicare PPO

## 2021-11-16 DIAGNOSIS — J690 Pneumonitis due to inhalation of food and vomit: Secondary | ICD-10-CM

## 2021-11-16 DIAGNOSIS — N189 Chronic kidney disease, unspecified: Secondary | ICD-10-CM

## 2021-11-16 DIAGNOSIS — E43 Unspecified severe protein-calorie malnutrition: Secondary | ICD-10-CM

## 2021-11-16 DIAGNOSIS — I953 Hypotension of hemodialysis: Secondary | ICD-10-CM

## 2021-11-16 LAB — CULTURE, BLOOD (ROUTINE X 2)
Culture: NO GROWTH
Culture: NO GROWTH
Special Requests: ADEQUATE
Special Requests: ADEQUATE

## 2021-11-16 LAB — HEPATIC FUNCTION PANEL
ALT: 72 U/L — ABNORMAL HIGH (ref 0–44)
AST: 58 U/L — ABNORMAL HIGH (ref 15–41)
Albumin: 3 g/dL — ABNORMAL LOW (ref 3.5–5.0)
Alkaline Phosphatase: 163 U/L — ABNORMAL HIGH (ref 38–126)
Bilirubin, Direct: 0.5 mg/dL — ABNORMAL HIGH (ref 0.0–0.2)
Indirect Bilirubin: 1 mg/dL — ABNORMAL HIGH (ref 0.3–0.9)
Total Bilirubin: 1.5 mg/dL — ABNORMAL HIGH (ref 0.3–1.2)
Total Protein: 5.9 g/dL — ABNORMAL LOW (ref 6.5–8.1)

## 2021-11-16 LAB — RENAL FUNCTION PANEL
Albumin: 2.9 g/dL — ABNORMAL LOW (ref 3.5–5.0)
Anion gap: 7 (ref 5–15)
BUN: 95 mg/dL — ABNORMAL HIGH (ref 8–23)
CO2: 22 mmol/L (ref 22–32)
Calcium: 7.2 mg/dL — ABNORMAL LOW (ref 8.9–10.3)
Chloride: 102 mmol/L (ref 98–111)
Creatinine, Ser: 3.32 mg/dL — ABNORMAL HIGH (ref 0.44–1.00)
GFR, Estimated: 14 mL/min — ABNORMAL LOW (ref >60)
Glucose, Bld: 189 mg/dL — ABNORMAL HIGH (ref 70–99)
Phosphorus: 5.2 mg/dL — ABNORMAL HIGH (ref 2.5–4.6)
Potassium: 3.9 mmol/L (ref 3.5–5.1)
Sodium: 131 mmol/L — ABNORMAL LOW (ref 135–145)

## 2021-11-16 LAB — GLUCOSE, CAPILLARY
Glucose-Capillary: 128 mg/dL — ABNORMAL HIGH (ref 70–99)
Glucose-Capillary: 129 mg/dL — ABNORMAL HIGH (ref 70–99)
Glucose-Capillary: 147 mg/dL — ABNORMAL HIGH (ref 70–99)
Glucose-Capillary: 151 mg/dL — ABNORMAL HIGH (ref 70–99)
Glucose-Capillary: 151 mg/dL — ABNORMAL HIGH (ref 70–99)
Glucose-Capillary: 156 mg/dL — ABNORMAL HIGH (ref 70–99)
Glucose-Capillary: 163 mg/dL — ABNORMAL HIGH (ref 70–99)
Glucose-Capillary: 165 mg/dL — ABNORMAL HIGH (ref 70–99)
Glucose-Capillary: 166 mg/dL — ABNORMAL HIGH (ref 70–99)
Glucose-Capillary: 167 mg/dL — ABNORMAL HIGH (ref 70–99)
Glucose-Capillary: 174 mg/dL — ABNORMAL HIGH (ref 70–99)
Glucose-Capillary: 176 mg/dL — ABNORMAL HIGH (ref 70–99)
Glucose-Capillary: 185 mg/dL — ABNORMAL HIGH (ref 70–99)
Glucose-Capillary: 185 mg/dL — ABNORMAL HIGH (ref 70–99)
Glucose-Capillary: 190 mg/dL — ABNORMAL HIGH (ref 70–99)
Glucose-Capillary: 192 mg/dL — ABNORMAL HIGH (ref 70–99)
Glucose-Capillary: 252 mg/dL — ABNORMAL HIGH (ref 70–99)
Glucose-Capillary: 363 mg/dL — ABNORMAL HIGH (ref 70–99)
Glucose-Capillary: 480 mg/dL — ABNORMAL HIGH (ref 70–99)

## 2021-11-16 LAB — CBC
HCT: 25 % — ABNORMAL LOW (ref 36.0–46.0)
Hemoglobin: 8.2 g/dL — ABNORMAL LOW (ref 12.0–15.0)
MCH: 29.2 pg (ref 26.0–34.0)
MCHC: 32.8 g/dL (ref 30.0–36.0)
MCV: 89 fL (ref 80.0–100.0)
Platelets: 12 10*3/uL — CL (ref 150–400)
RBC: 2.81 MIL/uL — ABNORMAL LOW (ref 3.87–5.11)
RDW: 21 % — ABNORMAL HIGH (ref 11.5–15.5)
WBC: 14.6 10*3/uL — ABNORMAL HIGH (ref 4.0–10.5)
nRBC: 0 % (ref 0.0–0.2)

## 2021-11-16 LAB — APTT
aPTT: 71 seconds — ABNORMAL HIGH (ref 24–36)
aPTT: 72 seconds — ABNORMAL HIGH (ref 24–36)

## 2021-11-16 LAB — RETICULOCYTES
Immature Retic Fract: 29.6 % — ABNORMAL HIGH (ref 2.3–15.9)
RBC.: 2.79 MIL/uL — ABNORMAL LOW (ref 3.87–5.11)
Retic Count, Absolute: 186.9 10*3/uL — ABNORMAL HIGH (ref 19.0–186.0)
Retic Ct Pct: 6.7 % — ABNORMAL HIGH (ref 0.4–3.1)

## 2021-11-16 LAB — LACTATE DEHYDROGENASE: LDH: 584 U/L — ABNORMAL HIGH (ref 98–192)

## 2021-11-16 LAB — MAGNESIUM: Magnesium: 1.8 mg/dL (ref 1.7–2.4)

## 2021-11-16 MED ORDER — MIDODRINE HCL 5 MG PO TABS
10.0000 mg | ORAL_TABLET | Freq: Three times a day (TID) | ORAL | Status: DC
  Administered 2021-11-16 – 2021-11-29 (×27): 10 mg via ORAL
  Filled 2021-11-16 (×32): qty 2

## 2021-11-16 MED ORDER — INSULIN REGULAR(HUMAN) IN NACL 100-0.9 UT/100ML-% IV SOLN
INTRAVENOUS | Status: DC
  Administered 2021-11-16: 8.5 [IU]/h via INTRAVENOUS
  Administered 2021-11-18: 1.4 [IU]/h via INTRAVENOUS
  Filled 2021-11-16 (×2): qty 100

## 2021-11-16 MED ORDER — PROSOURCE PLUS PO LIQD
30.0000 mL | Freq: Three times a day (TID) | ORAL | Status: DC
  Administered 2021-11-16: 30 mL via ORAL
  Filled 2021-11-16: qty 30

## 2021-11-16 MED ORDER — PROSOURCE TF PO LIQD
45.0000 mL | Freq: Two times a day (BID) | ORAL | Status: DC
  Administered 2021-11-16 – 2021-11-25 (×17): 45 mL
  Filled 2021-11-16 (×16): qty 45

## 2021-11-16 MED ORDER — ENSURE ENLIVE PO LIQD
237.0000 mL | Freq: Three times a day (TID) | ORAL | Status: DC
  Administered 2021-11-16 – 2021-11-17 (×4): 237 mL via ORAL

## 2021-11-16 MED ORDER — ALPRAZOLAM 0.25 MG PO TABS
0.2500 mg | ORAL_TABLET | Freq: Once | ORAL | Status: AC
  Administered 2021-11-16: 0.25 mg via ORAL
  Filled 2021-11-16: qty 1

## 2021-11-16 MED ORDER — VITAL 1.5 CAL PO LIQD
1000.0000 mL | ORAL | Status: DC
  Administered 2021-11-16 – 2021-11-24 (×4): 1000 mL
  Filled 2021-11-16 (×10): qty 1000

## 2021-11-16 MED ORDER — GADOBENATE DIMEGLUMINE 529 MG/ML IV SOLN
6.0000 mL | Freq: Once | INTRAVENOUS | Status: AC | PRN
  Administered 2021-11-16: 6 mL via INTRAVENOUS

## 2021-11-16 NOTE — Procedures (Signed)
Cortrak  Person Inserting Tube:  Waleed Dettman, Creola Corn, RD Tube Type:  Cortrak - 43 inches Tube Size:  10 Tube Location:  Left nare Initial Placement:  Stomach Secured by: Bridle Technique Used to Measure Tube Placement:  Marking at nare/corner of mouth Cortrak Secured At:  70 cm  Cortrak Tube Team Note:  Consult received to place a Cortrak feeding tube.   X-ray is required, abdominal x-ray has been ordered by the Cortrak team. Please confirm tube placement before using the Cortrak tube.   If the tube becomes dislodged please keep the tube and contact the Cortrak team at [email or web address] ([email or web address]) for replacement.  If after hours and replacement cannot be delayed, place a NG tube and confirm placement with an abdominal x-ray.     Theone Stanley., MS, RD, LDN (she/her/hers) RD pager number and weekend/on-call pager number located in [place].

## 2021-11-16 NOTE — Progress Notes (Signed)
Inpatient Rehabilitation Admissions Coordinator   We will follow up next week with her medical and therapy progress to assist with planning rehab venue options.   Danne Baxter, RN, MSN Rehab Admissions Coordinator [phone] 11/16/2021 12:31 PM

## 2021-11-16 NOTE — Progress Notes (Signed)
ANTICOAGULATION CONSULT NOTE  Pharmacy Consult for bivalirudin Indication: pulmonary embolus  Allergies  Allergen Reactions   Aspirin Nausea And Vomiting    Other reaction(s): Unknown   Benadryl [Diphenhydramine]     Can only take dye free. States the dye causes itching.   Darvon [Propoxyphene] Itching    Can only during the day. Night time makes her itch   Hydrocodone Bit-Homatrop Mbr Itching   Metformin     Other reaction(s): GI   Other     Other reaction(s): Unknown. States reaction was to nasal spray that caused pupils to shrink   Pentazocine     nervous Other reaction(s): jitters   Percocet [Oxycodone-Acetaminophen]     itching   Sulfa Antibiotics Hives   Tetracaine Hcl     Other reaction(s): Unknown. Thinks caused itching.   Tetracyclines & Related Hives   Tramadol     Other reaction(s): itch    Patient Measurements: Height: 5\' 5"  (165.1 cm) Weight: 68 kg (149 lb 14.6 oz) IBW/kg (Calculated) : 57  Vital Signs: Temp: 98.1 F (36.7 C) (01/20 0351) Temp Source: Oral (01/20 0351) BP: 101/50 (01/20 0600) Pulse Rate: 81 (01/20 0600)  Labs: Recent Labs    11/14/21 0423 11/14/21 1037 11/15/21 0407 11/15/21 1601 11/16/21 0444  HGB 11.1*  --  9.4*  --  8.2*  HCT 34.6*  --  27.7*  --  25.0*  PLT 14*  --  12*  --  12*  APTT 49*   < > 69* 70* 71*  CREATININE 1.45*   < > 3.76* 1.77* 3.32*   < > = values in this interval not displayed.     Estimated Creatinine Clearance: 13.6 mL/min (A) (by C-G formula based on SCr of 3.32 mg/dL (H)).   Assessment: 71 yo female with PE s/p thrombectomy, possible HIT, on bivalirudin.   aPTT tonight came back therapeutic at 71 on 0.09 mg/kg bilvalirudin. Currently off CRRT. Started iHD and likely getting another session today. H/H down. Plt remains low at 12. No s/sx of bleeding or infusion issues.   Goal of Therapy:  PTT goal 50-85 sec Monitor platelets by anticoagulation protocol: Yes   Plan:  Continue bivalirudin at  0.09 mg/kg/hr Will monitor aPTT every 12 hours - next level on 1/20@0500    Varney Daily, PharmD PGY1 Pharmacy Resident  Please check AMION for all [facility] pharmacy phone numbers After 10:00 PM call main pharmacy [phone]

## 2021-11-16 NOTE — Progress Notes (Addendum)
[facility] Progress Note     Assessment/ Plan:   Renal failure secondary to ATN in the setting of cardiac arrest, hypotension, pressors, contrast.  BL CKD3A with BL creatinine in the 1.17-1.3 range in late 2021. - R fem HD cath - daughter tells me plans to switch to IJ at some point mainly for comfort, would be great - should be tunneled if possible - CRRT 10/31/20 - 11/07/21 -HD 11/08/21--> had to have pressor with it - HD treatment 11/10/20--> cathether issues- wonder if BFR wasn't achieved--> BUN up, breathing worse, + fluid balance again, AMS so resumed CRRT 1/15 -18 - Tolerated iHF 1/19 with 2L UF.  Plan next tomorrow UF 2.5-3L as tol.     Prolonged VF cardiac arrest--> OOH VF arrest 2/2 massive PE  Massive PE s/p mechanical thrombectomy 1/2 of R and L PA.  Vasc US- no DVT, liver mass noted 10/28/21 CT abd/ pelvis, s/p MRI abd--> incompletely characterized, had MRI with gadolinium today to reevaluate.  No role for HD at this point today.    Retroperitoneal bleed/Hemorrhagic shock/Acute blood loss anemia originating from left liver lobe s/p embolization 1/4--> required FFP, plts, Vit K, pRBCs; now stable  Thrombocytopenia- component of DIC suspected, HIT negative, on bivalirudin and avoiding heparin per heme, getting platelet transfusions PRN, heme involved, suspects ITP, getting Nplate PRN and dex.  Would need plt transfusion for line change.   DM: insulin per primary  Acute hypoxic RF- extubated 11/07/21, now weaned to RA  Sepsis/ leukocytosis/ aspiration pneumonia- MRI with aspiration PNA.  S/p  CTX and vanc   FEN: on TPN still, MBSS results noted 1/18 so going to try po intake; RD working with her to increase.   Dispo: ICU  Subjective:    I/O yest 2.1 / 2.0 - tolerated 2L UF with HD yesterday. Femoral HD catheter working fine, just uncomfortable for her. Doing ok this AM - no new issues RD working on her with diet.  Daughter bedside.   Objective:   BP (!)  107/50    Pulse 88    Temp 97.8 F (36.6 C) (Oral)    Resp 15    Ht 5\' 5"  (1.651 m)    Wt 71.1 kg    SpO2 96%    BMI 26.08 kg/m   Intake/Output Summary (Last 24 hours) at 11/16/2021 1104 Last data filed at 11/16/2021 0900 Gross per 24 hour  Intake 2114.83 ml  Output 2000 ml  Net 114.83 ml    Weight change: 0 kg  Physical Exam: GEN: NAD,lying in bed HEENT:  sclerae anicteric NECK: Supple, no thyromegaly LUNGS: normal WOB on HFNC, coarse ant, no wheezing or rales CV: Reg rate ABD: soft, nontender EXT: edema resolved  Imaging: MR LIVER W WO CONTRAST  Result Date: 11/16/2021 CLINICAL DATA:  Intrahepatic hematoma on recent CT and MR. Patient underwent left hepatic artery angiogram a Gel-Foam embolization on 11/01/2021. EXAM: MRI ABDOMEN WITHOUT AND WITH CONTRAST TECHNIQUE: Multiplanar multisequence MR imaging of the abdomen was performed both before and after the administration of intravenous contrast. CONTRAST:  66mL MULTIHANCE GADOBENATE DIMEGLUMINE 529 MG/ML IV SOLN COMPARISON:  MRI 11/08/2021.  CT scan 10/30/2021. FINDINGS: Note: Fine anatomic detail is obscured by motion artifact on today's study as is contrast resolution. Lower chest: Left lower lobe collapse/consolidation. Expansion in the posterior right lower lobe has improved in the interval. Hepatobiliary: As before, there is a large 9.5 x 10.5 x 6.9 cm heterogeneous lesion/collection in the inferior aspect of  the lateral segment left liver. This lesion was measured at 8.5 x 10.0 x 7.0 cm previously. After IV contrast administration, there is some peripheral enhancement around the lesion. The fluid fluid level seen on the previous exam is less prominent today. Coronal imaging shows irregular thickened peripheral enhancement, most prominent along the superior margin (see coronal postcontrast image 91 of series 11). Gallbladder is markedly distended with multiple gallstones evident. Stones are noted in the neck of the gallbladder (coronal  15/3 and axial 27/4). No intrahepatic or extrahepatic biliary dilation. Pancreas: No focal mass lesion. No dilatation of the main duct. No intraparenchymal cyst. No peripancreatic edema. Fine detail of pancreatic anatomy obscured by motion artifact. Spleen: No gross abnormality in the spleen with fine detail obscured by motion artifact. Adrenals/Urinary Tract: No adrenal nodule or mass. Postcontrast imaging of the kidneys is severely limited by motion artifact. Pre contrast T2 imaging shows areas of apparent segmental edema in both kidneys. No hydronephrosis. Stomach/Bowel: Stomach is unremarkable. No gastric wall thickening. No evidence of outlet obstruction. Duodenum is normally positioned as is the ligament of Treitz. No small bowel or colonic dilatation within the visualized abdomen. Vascular/Lymphatic: No abdominal aortic aneurysm. No abdominal lymphadenopathy. Other:  No intraperitoneal free fluid. Musculoskeletal: No focal suspicious marrow enhancement within the visualized bony anatomy. IMPRESSION: 1. Limited study due to motion artifact. As before, a large lesion in the inferior aspect of the lateral segment left liver is again noted, stable to minimally increased in the interval. The fluid fluid level seen on the previous study is less prominent today. Peripheral enhancement noted around the lesion with irregular enhancing margins most prominent superiorly. While the bulk of this lesion does not enhance centrally, consistent with hematoma, given the irregular peripheral enhancement along the cranial margin, hemorrhagic neoplasm cannot be excluded. 2. Markedly distended gallbladder with gallstones. No evidence for choledocholithiasis. No intrahepatic or extrahepatic biliary dilation. 3. Areas of segmental edema in both kidneys. Pyelonephritis not excluded. 4. Left greater than right lower lobe collapse/consolidation. 5. Interval decrease in intraperitoneal free fluid Electronically Signed   By: Misty Stanley M.D.   On: 11/16/2021 10:27    Labs: BMET Recent Labs  Lab 11/13/21 0406 11/13/21 1557 11/14/21 0423 11/14/21 1722 11/15/21 0407 11/15/21 1601 11/16/21 0444  NA 134* 134* 135 132* 131* 140 131*  K 3.9 4.3 3.9 4.8 4.3 3.7 3.9  CL 97* 99 99 97* 96* 96* 102  CO2 23 23 24  20* 19* 24 22  GLUCOSE 170* 185* 115* 214* 186* 183* 189*  BUN 61* 58* 47* 98* 133* 46* 95*  CREATININE 2.03* 1.86* 1.45* 2.81* 3.76* 1.77* 3.32*  CALCIUM 8.8* 8.3* 8.6* 8.4* 8.2* 9.3 7.2*  PHOS 2.1* 2.9 2.8 4.2 5.9* 2.7 5.2*    CBC Recent Labs  Lab 11/13/21 0406 11/14/21 0423 11/15/21 0407 11/16/21 0444  WBC 33.1* 33.7* 26.7* 14.6*  HGB 11.6* 11.1* 9.4* 8.2*  HCT 33.9* 34.6* 27.7* 25.0*  MCV 86.5 88.9 88.2 89.0  PLT 11* 14* 12* 12*     Medications:     atorvastatin  20 mg Oral Daily   chlorhexidine  15 mL Mouth Rinse BID   Chlorhexidine Gluconate Cloth  6 each Topical Daily   Chlorhexidine Gluconate Cloth  6 each Topical Q0600   feeding supplement  237 mL Oral BID BM   folic acid  2 mg Oral Daily   mouth rinse  15 mL Mouth Rinse BID   mouth rinse  15 mL Mouth Rinse q12n4p  midodrine  10 mg Oral TID WC   pantoprazole (PROTONIX) IV  40 mg Intravenous Q1400    Jannifer Hick MD [facility] Pager [phone]

## 2021-11-16 NOTE — Progress Notes (Signed)
eLink Physician-Brief Progress Note Patient Name: [name] DOB: August 12, 1951 MRN: [id number]   Date of Service  11/16/2021  HPI/Events of Note Pt CBG 480. Was on Insulin gtt 1/19 but stopped @ 2pm. Cuurently only on a moderate insulin sliding scale [place]    eICU Interventions Re-ordered insulin infusion         Matilde Markie N Jodi Criscuolo 11/16/2021, 1:14 AM

## 2021-11-16 NOTE — Progress Notes (Signed)
Nutrition Follow-up  DOCUMENTATION CODES:   Severe malnutrition in context of acute illness/injury  INTERVENTION:   Cortrak placed today, starting EN; TPN on hold, encouraging oral intake  Tube feeding via Cortrak:  Given hx of poor intolerance of TF (did not tolerate any trials of TF this admission), will plan to start TF at low rate with titration to goal rate. If able to tolerate at goal, plan to switch to nocturnal TF Vital 1.5 at 20 ml/hr; titrate by 10 mL q 8 hours until goal rate of 50 ml/hr Pro-Source TF 45 mL BID Provides 103 g of protein, 1880 kcals, 912 mL of free water  Recommend continuing insulin drip until pt off nutrition support, tolerating po intake  Encourage po intake, continue Ensure Enlive po TID, each supplement provides 350 kcal and 20 grams of protein, as tolerated. Boost Breeze if pt prefers   NUTRITION DIAGNOSIS:   Severe Malnutrition related to acute illness as evidenced by moderate muscle depletion, energy intake < or equal to 50% for > or equal to 5 days, mild fat depletion.  Being addressed   GOAL:   Patient will meet greater than or equal to 90% of their needs  Progressing  MONITOR:   Diet advancement, Labs, Weight trends, Other (Comment)  REASON FOR ASSESSMENT:   Ventilator    ASSESSMENT:   71 yo female admitted with acute encephalopathy post OOH prolonged V.fib arrest with multiorgan failure with massive saddle PE and DIC, AKI. PMH includes DM, HTN, HLD, GERD, anxiety, depression, fibromyalgia  1/01 Admitted, VF arrest, Intubated, Concern for anoxic injury 1/02 Saddle PE s/p Mechanical thrombectomy 1/03 Hemodynamics improved post thrombectomy, weaning pressors, Hgb table, bleeding reduced, started trickle TF. Later in day, pressor requirement increased with Hgb drop to around 5 and found to have large intraperitoneal/intraparenchymal hemorrhage extending into left subscapular hematoma. Pt received PRBC x 5, FFP x 5, Platelets x 1 1/05  acute hemorrhage from left hepatic lob s/p Left hepatic lobe embolization, TF resumed at trickle 1/06 Episode of emesis after TF increased to 65 ml/hr 1/09 TF held all weekend, resuming trickle TF today 1/10 Started to advanced TF pat 10 ml/hr but pt extubated later in the day, OG tube removed 1/11 Cortrak unable to be placed as platelets 5 post transfusion, failed bedside swallow, plan for TPN and hematology consulted, CRRT stopped 1/12 Platelets <5, off CRRT, iHD 1/14 iHD again 1/15 CRRT initiated 1/18 MBS-diet adv to [facility] 1/19 Diet advanced to Dysphagia 1, Thins; iHD with UF 2L 1/20 Cortrak placed, holding TPN  Holding CRRT, tolerated iHD yesterday; plan for iHD again tomorrow  Diet advanced to FL on 1/18 then to Dysphagia 1 on 1/20. Pt's appetite and po intake have not been good and remain poor at this time. No recorded po intake since 1/18 at dinner which was 25%. Pt did not eat lunch meal today; tried Ensure  but told RN it gave her indigestion and she could not finish it. Trying Boost Breeze currently.   TPN continues at 70 ml/hr providing 1680 mL of fluid, 121 g of protein and [place] has been discussed in detail with pt, family, MD and Pharmacy. Decision was made by MD to attempt Cortrak today despite low platelet count (platelets 12). TPN to be placed on hold. If pt does not tolerate TF, then plan to resume TPN if pt not eating.   Long discussion with MD and Pharmacy regarding TPN and appetite. Request had been previously made  to consider decreasing TPN to improve appetite; RD is not aware of any evidence in the literature to support this practice. In fact, there are studies that discuss the opposite in which patient's on chronic full strength TPN complaining of feeling hungry. RD inclined to believe that poor appetite related to critical illness, prolonged period of not utilizing GI tract.   CBGs have been very difficult to manage; insulin drip currently. Recommend  continuing insulin drip for now as stopping TPN, starting TF and encouraging po.   Labs: CBGs 147-185, sodium 131 (L), phosphorus 5.2 (L) Meds: folic acid, insulin drip   Diet Order:   Diet Order             DIET - DYS 1 Room service appropriate? Yes with Assist; Fluid consistency: Thin  Diet effective now                   EDUCATION NEEDS:   Not appropriate for education at this time  Skin:  Skin Assessment: Reviewed RN Assessment  Last BM:  1/18  Height:   Ht Readings from Last 1 Encounters:  10/28/21 5\' 5"  (1.651 m)    Weight:   Wt Readings from Last 1 Encounters:  11/16/21 71.1 kg    BMI:  Body mass index is 26.08 kg/m.  Estimated Nutritional Needs:   Kcal:  1950-2150 kcals  Protein:  100-125 g  Fluid:  >/= 1.8 L    Kerman Passey MS, RDN, LDN, CNSC Registered Dietitian III Clinical Nutrition RD Pager and On-Call Pager Number Located in [place]

## 2021-11-16 NOTE — Progress Notes (Signed)
Physical Therapy Treatment Patient Details Name: [name] MRN: [id number] DOB: 10-Dec-1950 Today's Date: 11/16/2021   History of Present Illness Pt is a 71 y.o. female admitted 10/28/21 after cardiac arrest requiring 20-min CPR for ROSC; ETT in the field. Pt had 2x additional codes in ED requiring CPR for 4 and 2-min. Workup for STEMI, cardiogenic shock, large saddle PE, RUQ bleeding, 2 rib fxs. S/p PE thrombectomy 1/2. Pt with intraperitoneal hemorrhage s/p embolization 1/5. ETT 1/1-1/10. CRRT 1/4-1/11; trialled iHD 1/12; CRRT again 1/15-1/18. PMH includes fibromyalgia, HTN, DM2, CKD, GERD, asthma.    PT Comments    Pt with multiple providers in prior to PT session as well as being downstairs for MRI and having HD for several days. Pt was fatigued and  needed to rest after session. Expect pt will perform better and tolerate more activity once she has gotten some rest. Continue to recommend inpatient rehab.    Recommendations for follow up therapy are one component of a multi-disciplinary discharge planning process, led by the attending physician.  Recommendations may be updated based on patient status, additional functional criteria and insurance authorization.  Follow Up Recommendations  Acute inpatient rehab (3hours/day)     Assistance Recommended at Discharge Frequent or constant Supervision/Assistance  Patient can return home with the following Two people to help with walking and/or transfers;Assistance with cooking/housework;Assist for transportation;Help with stairs or ramp for entrance;A lot of help with bathing/dressing/bathroom   Equipment Recommendations  Rolling walker (2 wheels);Wheelchair (measurements PT);Wheelchair cushion (measurements PT)    Recommendations for Other Services       Precautions / Restrictions Precautions Precautions: Fall Precaution Comments: L femoral non-tunneled HD cath - MD okayed getting up with fem catheter Restrictions Weight Bearing  Restrictions: No     Mobility  Bed Mobility Overal bed mobility: Needs Assistance Bed Mobility: Supine to Sit, Sit to Supine, Rolling Rolling: Min guard Sidelying to sit: Mod assist, HOB elevated   Sit to supine: Mod assist   General bed mobility comments: Assist to elevate trunk into sitting and bringing hips to EOB. Assist to bring legs back up into bed returning to supine.    Transfers                   General transfer comment: Pt declined attempts to stand. Pt reports fatigue.    Ambulation/Gait                   Stairs             Wheelchair Mobility    Modified Rankin (Stroke Patients Only)       Balance Overall balance assessment: Needs assistance Sitting-balance support: No upper extremity supported, Feet supported Sitting balance-Leahy Scale: Fair                                      Cognition Arousal/Alertness: Awake/alert Behavior During Therapy: WFL for tasks assessed/performed Overall Cognitive Status: Within Functional Limits for tasks assessed                                          Exercises      General Comments        Pertinent Vitals/Pain Pain Assessment Pain Assessment: Faces Faces Pain Scale: Hurts a little bit Pain Location: RLE with movement  Pain Descriptors / Indicators: Grimacing, Guarding, Sore    Home Living                          Prior Function            PT Goals (current goals can now be found in the care plan section) Acute Rehab PT Goals Patient Stated Goal: get some rest Progress towards PT goals: Not progressing toward goals - comment (fatigue)    Frequency    Min 3X/week      PT Plan Current plan remains appropriate    Co-evaluation              AM-PAC PT "6 Clicks" Mobility   Outcome Measure  Help needed turning from your back to your side while in a flat bed without using bedrails?: A Little Help needed moving from lying  on your back to sitting on the side of a flat bed without using bedrails?: A Lot Help needed moving to and from a bed to a chair (including a wheelchair)?: Total Help needed standing up from a chair using your arms (e.g., wheelchair or bedside chair)?: Total Help needed to walk in hospital room?: Total Help needed climbing 3-5 steps with a railing? : Total 6 Click Score: 9    End of Session   Activity Tolerance: Patient limited by fatigue Patient left: in bed;with call bell/phone within reach;with family/visitor present Nurse Communication: Mobility status PT Visit Diagnosis: Unsteadiness on feet (R26.81);Other abnormalities of gait and mobility (R26.89);Muscle weakness (generalized) (M62.81)     Time: 8366-2947 PT Time Calculation (min) (ACUTE ONLY): 10 min  Charges:  $Therapeutic Activity: 8-22 mins                     [place] Pager [phone] Office [place] 11/16/2021, 2:15 PM

## 2021-11-16 NOTE — Progress Notes (Signed)
PHARMACY - TOTAL PARENTERAL NUTRITION CONSULT NOTE  Indication:  Intolerance of enteral feeding and unable to obtain enteral access  Patient Measurements: Height: 5\' 5"  (165.1 cm) Weight: 71.1 kg (156 lb 12 oz) IBW/kg (Calculated) : 57   Body mass index is 26.08 kg/m.  Assessment:  25 YOF who was admitted 10/28/21 after cardiac arrest and out of hospital CPR due to large saddle PE. Patient has been intubated and course complicated by uGIB and significantly low platelets, acute hepatic and renal failure. Unable to place Cortrak due to thrombocytopenia, so Pharmacy consulted to manage TPN.   Glucose / Insulin: hx DM on Lantus 22/d and Prandin 2mg  BID PTA. Decadron 30 mg daily ended 1/19. Switched to vsSSI and Levemir 24 units BID briefly 1/14 PM, back on insulin gtt -- plan to transition to SQ once procedures are completed and nutrition established CBGs 163-363 Electrolytes: Na 131, K 3.9 (transitioned to iHD, last 1/19), phos 5.2, Mg 2.1>>1.8. Other electrolytes wnl.  Renal: BL SCr ~1.1. CRRT >> iHD, last HD 1/19.  Hepatic: LFTs elevated slightly(elevated prior to TPN initiation), tbili 1.5 (sclera jaundice noted by family on RN on 1/16). TG 154, albumin 2.3 Intake / Output; MIVF: net -23.6L, LBM 1/18 GI Imaging: none since start of TPN GI Surgeries / Procedures: none since start of TPN  Central access: Triple lumen CVC placed 10/28/21 (confirmed with RN dedicated lumen available; confirmed with CCM ok to use line for TPN despite age of line; unable to place new line due to AKI and platelets) TPN start date: 11/08/21  Nutritional Goals: RD Estimated Needs Total Energy Estimated Needs: 1950-2150 kcals Total Protein Estimated Needs: 100-125 g Total Fluid Estimated Needs: >/= 1.8 L  Current Nutrition:  TPN Thin Full Liquid >> DYS 1 thin diet - 25%, but NPO this AM for MRI  Plan:  Per discussion with CCM - plan for Cortrak today if unable to eat. Stopping TPN per CCM today - will have RN  reduce rate to 35 ml/hr for 4 hours then stop. Continue CBG checks every 4 hours with insulin drip.  CCM team to manage if plans for transition.  Will discontinue TPN labs and nursing care orders.  Please re-consult if needed.    Thank you for allowing pharmacy to be a part of this patients care.  Sloan Leiter, PharmD, BCPS, BCCCP Clinical Pharmacist Please refer to [facility] for [place] numbers 11/16/2021, 9:30 AM

## 2021-11-16 NOTE — Progress Notes (Signed)
ANTICOAGULATION CONSULT NOTE  Pharmacy Consult for bivalirudin Indication: pulmonary embolus  Allergies  Allergen Reactions   Aspirin Nausea And Vomiting    Other reaction(s): Unknown   Benadryl [Diphenhydramine]     Can only take dye free. States the dye causes itching.   Darvon [Propoxyphene] Itching    Can only during the day. Night time makes her itch   Hydrocodone Bit-Homatrop Mbr Itching   Metformin     Other reaction(s): GI   Other     Other reaction(s): Unknown. States reaction was to nasal spray that caused pupils to shrink   Pentazocine     nervous Other reaction(s): jitters   Percocet [Oxycodone-Acetaminophen]     itching   Sulfa Antibiotics Hives   Tetracaine Hcl     Other reaction(s): Unknown. Thinks caused itching.   Tetracyclines & Related Hives   Tramadol     Other reaction(s): itch    Patient Measurements: Height: 5\' 5"  (165.1 cm) Weight: 71.1 kg (156 lb 12 oz) IBW/kg (Calculated) : 57  Vital Signs: Temp: 98.5 F (36.9 C) (01/20 1553) Temp Source: Oral (01/20 1553) BP: 97/47 (01/20 1400) Pulse Rate: 89 (01/20 1400)  Labs: Recent Labs    11/14/21 0423 11/14/21 1037 11/15/21 0407 11/15/21 1601 11/16/21 0444 11/16/21 1703  HGB 11.1*  --  9.4*  --  8.2*  --   HCT 34.6*  --  27.7*  --  25.0*  --   PLT 14*  --  12*  --  12*  --   APTT 49*   < > 69* 70* 71* 72*  CREATININE 1.45*   < > 3.76* 1.77* 3.32*  --    < > = values in this interval not displayed.     Estimated Creatinine Clearance: 14.9 mL/min (A) (by C-G formula based on SCr of 3.32 mg/dL (H)).   Assessment: 71 yo female with PE s/p thrombectomy, possible HIT, on bivalirudin.   aPTT tonight came back therapeutic at 72 on 0.09 mg/kg bilvalirudin. Plt remains low at 12.   Goal of Therapy:  PTT goal 50-85 sec Monitor platelets by anticoagulation protocol: Yes   Plan:  Continue bivalirudin at 0.09 mg/kg/hr Will monitor aPTT every 12 hours    Hildred Laser, PharmD Clinical  Pharmacist **Pharmacist phone directory can now be found on [email or web address] ([email or web address]).  Listed under [place].
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
## 2021-11-16 NOTE — Progress Notes (Signed)
Occupational Therapy Treatment Patient Details Name: [name] MRN: [id number] DOB: [birth year] Today's Date: 11/16/2021   History of present illness Pt is a 71 y.o. female admitted 10/28/21 after cardiac arrest requiring 20-min CPR for ROSC; ETT in the field. Pt had 2x additional codes in ED requiring CPR for 4 and 2-min. Workup for STEMI, cardiogenic shock, large saddle PE, RUQ bleeding, 2 rib fxs. S/p PE thrombectomy 1/2. Pt with intraperitoneal hemorrhage s/p embolization 1/5. ETT 1/1-1/10. CRRT 1/4-1/11; trialled iHD 1/12; CRRT again 1/15-1/18. PMH includes fibromyalgia, HTN, DM2, CKD, GERD, asthma.   OT comments  This 71 yo female seen today to focus on sitting EOB for grooming tasks. She needs min A for oral care and setup/S for washing face. Pt not wanting to try and stand this date--said she was ready to lay back down after grooming tasks. We will continue to follow with AIR still recommended.   Recommendations for follow up therapy are one component of a multi-disciplinary discharge planning process, led by the attending physician.  Recommendations may be updated based on patient status, additional functional criteria and insurance authorization.    Follow Up Recommendations  Acute inpatient rehab (3hours/day)    Assistance Recommended at Discharge Frequent or constant Supervision/Assistance  Patient can return home with the following  Two people to help with walking and/or transfers;A lot of help with bathing/dressing/bathroom;Assistance with cooking/housework   Equipment Recommendations  Other (comment) (TBD next venue)    Recommendations for Other Services Rehab consult    Precautions / Restrictions Precautions Precautions: Fall Precaution Comments: L femoral non-tunneled HD cath - MD okayed getting up with fem catheter Restrictions Weight Bearing Restrictions: No       Mobility Bed Mobility Overal bed mobility: Needs Assistance Bed Mobility: Supine to Sit, Sit to  Supine   Sidelying to sit: Mod assist, HOB elevated   Sit to supine: Mod assist   General bed mobility comments: A for legs and trunk for up to EOB, A for legs for back into bed    Transfers                   General transfer comment: Was not in the mood to stand today, "I am going to scoot myself up in the bed and lay down" after brushing her teeth, washing face, and taking meds sitting at EOB     Balance Overall balance assessment: Needs assistance Sitting-balance support: No upper extremity supported, Feet supported Sitting balance-Leahy Scale: Fair                                     ADL either performed or assessed with clinical judgement   ADL Overall ADL's : Needs assistance/impaired     Grooming: Minimal assistance;Sitting;Wash/dry face;Oral care Grooming Details (indicate cue type and reason): EOB                                    Extremity/Trunk Assessment Upper Extremity Assessment Upper Extremity Assessment: Generalized weakness            Vision Baseline Vision/History: 1 Wears glasses Ability to See in Adequate Light: 0 Adequate Patient Visual Report: No change from baseline            Cognition Arousal/Alertness: Awake/alert Behavior During Therapy: WFL for tasks assessed/performed Overall Cognitive Status: Within Functional Limits  for tasks assessed                                                     Pertinent Vitals/ Pain       Pain Assessment Pain Assessment: Faces Faces Pain Scale: No hurt         Frequency  Min 2X/week        Progress Toward Goals  OT Goals(current goals can now be found in the care plan section)  Progress towards OT goals: Progressing toward goals  Acute Rehab OT Goals Patient Stated Goal: to be able to go home OT Goal Formulation: With patient/family Time For Goal Achievement: 11/26/21 Potential to Achieve Goals: Good  Plan Discharge plan  remains appropriate       AM-PAC OT "6 Clicks" Daily Activity     Outcome Measure   Help from another person eating meals?: A Little Help from another person taking care of personal grooming?: A Little Help from another person toileting, which includes using toliet, bedpan, or urinal?: A Lot Help from another person bathing (including washing, rinsing, drying)?: A Lot Help from another person to put on and taking off regular upper body clothing?: A Lot Help from another person to put on and taking off regular lower body clothing?: Total 6 Click Score: 13    End of Session    OT Visit Diagnosis: Unsteadiness on feet (R26.81);Other abnormalities of gait and mobility (R26.89);Muscle weakness (generalized) (M62.81)   Activity Tolerance Patient tolerated treatment well (but did not want to try standing)   Patient Left in bed;with call bell/phone within reach;with family/visitor present   Nurse Communication Mobility status (did not want to stand)        Time: 7124-5809 OT Time Calculation (min): 10 min  Charges: OT General Charges $OT Visit: 1 Visit OT Treatments $Self Care/Home Management : 8-22 mins  [place], OTR/L Acute [employer] Pager [phone] Office [phone]    Almon Register 11/16/2021, 1:48 PM

## 2021-11-16 NOTE — Progress Notes (Signed)
Overall, seems like [name] is making a little bit of progress.  There is some thinking that she may need to have a hemodialysis catheter.  I think she did not get dialysis for a couple days.  There is been no obvious bleeding.  She has little bit of blood-tinged sputum when she coughs.  So far, cultures have been all negative for any obvious infection.  Platelet count is still on the low side.  It has been very challenging to get this back up.  It is hard to say as to what is actually still going on with the thrombocytopenia.  We may have to think about doing a bone marrow test on her.  She still is on the bivalirudin for the thromboembolic disease.  We have to worry about the possibility of underlying malignancy.  We cannot do an MRI of the liver with contrast because of her renal function.  Maybe, because she is can be on dialysis, we will be able to get the IV contrast in.  This is going to be incredibly important.  I just do not believe that DIC is still the etiology for the thrombocytopenia.  She is having a little bit to eat which is nice to see.  I think is mostly liquids right now.  Her BUN today is 95 creatinine 3.32.  We did send off a ADAMTS-13 assay on her.  It was 62%.  As such, I doubt that she has a microangiopathic hemolytic process (i.e. TTP).   Her white cell count has come down quite nicely.  Is 14.6.  Her hemoglobin is 8.2.  Her platelet count is 12,000.  A little bit surprised that the white cell count has drifted down.  Again, we may have to think about doing a bone marrow test on her.  It would be interesting to see what the liver function studies are.  She actually looks pretty good.  Her voice sounds stronger.  I know this is incredibly complicated.  I regarding the CCU I doing a great job with her.   [name], mD  Psalms 41:3

## 2021-11-16 NOTE — Progress Notes (Signed)
Inpatient Diabetes Program Recommendations  AACE/ADA: New Consensus Statement on Inpatient Glycemic Control (2015)  Target Ranges:  Prepandial:   less than 140 mg/dL      Peak postprandial:   less than 180 mg/dL (1-2 hours)      Critically ill patients:  140 - 180 mg/dL   Lab Results  Component Value Date   GLUCAP 166 (H) 11/16/2021   HGBA1C 9.0 (H) 10/28/2021    Review of Glycemic Control  Latest Reference Range & Units 11/16/21 05:48 11/16/21 06:53 11/16/21 08:31  Glucose-Capillary 70 - 99 mg/dL 174 (H) 185 (H) 166 (H)  (H): Data is abnormally high Diabetes history: Type 2 DM Current orders for Inpatient glycemic control: Levemir 20 units BID, IV insulin  Inpatient Diabetes Program Recommendations:    Attempted to transition, however seems IV insulin was discontinued prior to 2 hour overlap, hence hyperglycemia. Secure chat sent to RN for plan of care and to ensure overlap prior to discontinuation. If plan is to transition would recommend: -Levemir 24 units two hours prior to discontinuation, then BID to follow. -Novolog 2-6 units Q4H  Thanks, Bronson Curb, MSN, RNC-OB Diabetes Coordinator [phone] (8a-5p)

## 2021-11-17 DIAGNOSIS — Z931 Gastrostomy status: Secondary | ICD-10-CM

## 2021-11-17 LAB — RENAL FUNCTION PANEL
Albumin: 2.6 g/dL — ABNORMAL LOW (ref 3.5–5.0)
Anion gap: 16 — ABNORMAL HIGH (ref 5–15)
BUN: 147 mg/dL — ABNORMAL HIGH (ref 8–23)
CO2: 18 mmol/L — ABNORMAL LOW (ref 22–32)
Calcium: 8 mg/dL — ABNORMAL LOW (ref 8.9–10.3)
Chloride: 96 mmol/L — ABNORMAL LOW (ref 98–111)
Creatinine, Ser: 5.22 mg/dL — ABNORMAL HIGH (ref 0.44–1.00)
GFR, Estimated: 8 mL/min — ABNORMAL LOW (ref >60)
Glucose, Bld: 314 mg/dL — ABNORMAL HIGH (ref 70–99)
Phosphorus: 8.8 mg/dL — ABNORMAL HIGH (ref 2.5–4.6)
Potassium: 5 mmol/L (ref 3.5–5.1)
Sodium: 130 mmol/L — ABNORMAL LOW (ref 135–145)

## 2021-11-17 LAB — GLUCOSE, CAPILLARY
Glucose-Capillary: 129 mg/dL — ABNORMAL HIGH (ref 70–99)
Glucose-Capillary: 149 mg/dL — ABNORMAL HIGH (ref 70–99)
Glucose-Capillary: 172 mg/dL — ABNORMAL HIGH (ref 70–99)
Glucose-Capillary: 73 mg/dL (ref 70–99)
Glucose-Capillary: 296 mg/dL — ABNORMAL HIGH (ref 70–99)
Glucose-Capillary: 62 mg/dL — ABNORMAL LOW (ref 70–99)
Glucose-Capillary: 284 mg/dL — ABNORMAL HIGH (ref 70–99)
Glucose-Capillary: 311 mg/dL — ABNORMAL HIGH (ref 70–99)
Glucose-Capillary: 241 mg/dL — ABNORMAL HIGH (ref 70–99)
Glucose-Capillary: 174 mg/dL — ABNORMAL HIGH (ref 70–99)
Glucose-Capillary: 164 mg/dL — ABNORMAL HIGH (ref 70–99)
Glucose-Capillary: 232 mg/dL — ABNORMAL HIGH (ref 70–99)
Glucose-Capillary: 136 mg/dL — ABNORMAL HIGH (ref 70–99)
Glucose-Capillary: 234 mg/dL — ABNORMAL HIGH (ref 70–99)
Glucose-Capillary: 231 mg/dL — ABNORMAL HIGH (ref 70–99)
Glucose-Capillary: 261 mg/dL — ABNORMAL HIGH (ref 70–99)
Glucose-Capillary: 242 mg/dL — ABNORMAL HIGH (ref 70–99)
Glucose-Capillary: 218 mg/dL — ABNORMAL HIGH (ref 70–99)
Glucose-Capillary: 193 mg/dL — ABNORMAL HIGH (ref 70–99)

## 2021-11-17 LAB — CBC
HCT: 25.5 % — ABNORMAL LOW (ref 36.0–46.0)
Hemoglobin: 8.4 g/dL — ABNORMAL LOW (ref 12.0–15.0)
MCH: 29.4 pg (ref 26.0–34.0)
MCHC: 32.9 g/dL (ref 30.0–36.0)
MCV: 89.2 fL (ref 80.0–100.0)
Platelets: 16 10*3/uL — CL (ref 150–400)
RBC: 2.86 MIL/uL — ABNORMAL LOW (ref 3.87–5.11)
RDW: 20.9 % — ABNORMAL HIGH (ref 11.5–15.5)
WBC: 13.5 10*3/uL — ABNORMAL HIGH (ref 4.0–10.5)
nRBC: 0 % (ref 0.0–0.2)

## 2021-11-17 LAB — APTT
aPTT: 76 seconds — ABNORMAL HIGH (ref 24–36)
aPTT: 70 seconds — ABNORMAL HIGH (ref 24–36)

## 2021-11-17 LAB — MAGNESIUM: Magnesium: 1.9 mg/dL (ref 1.7–2.4)

## 2021-11-17 MED ORDER — ALBUMIN HUMAN 25 % IV SOLN
INTRAVENOUS | Status: AC
  Administered 2021-11-17: 25 g
  Filled 2021-11-17: qty 200

## 2021-11-17 MED ORDER — ALBUMIN HUMAN 25 % IV SOLN: 25.0000 g | Freq: Once | INTRAVENOUS | Status: AC

## 2021-11-17 MED ORDER — B COMPLEX-C PO TABS
1.0000 | ORAL_TABLET | Freq: Every day | ORAL | Status: DC
  Administered 2021-11-17 – 2021-12-04 (×16): 1 via ORAL
  Filled 2021-11-17 (×17): qty 1

## 2021-11-17 MED ORDER — B COMPLEX-C PO TABS: 1.0000 | ORAL_TABLET | Freq: Every day | ORAL | Status: DC

## 2021-11-17 NOTE — Progress Notes (Signed)
°   11/17/21 1130  Hand-Off documentation  Handoff Given Given to shift RN/LPN  Report given to (Full Name) Jeff,RN  Handoff Received Other (see comments) (HD)  Report received from (Full Name) Jerrye Bushy  Vitals  Temp 98 F (36.7 C)  Temp Source Oral  BP 98/67  MAP (mmHg) 79  BP Location Right Arm  BP Method Automatic  Patient Position (if appropriate) Lying  Pulse Rate 77  Pulse Rate Source Monitor  ECG Heart Rate 77  Resp 15  Oxygen Therapy  SpO2 95 %  O2 Device Room Air  Pain Assessment  Pain Scale 0-10  Pain Score 0  Post-Hemodialysis Assessment  Rinseback Volume (mL) 250 mL  KECN 179 V  Dialyzer Clearance Lightly streaked  Duration of HD Treatment -hour(s) 3 hour(s)  Hemodialysis Intake (mL) 500 mL  UF Total -Machine (mL) 2500 mL  Net UF (mL) 2000 mL  Tolerated HD Treatment Yes  Post-Hemodialysis Comments tx complete, pt stable  Hemodialysis Catheter Left Femoral vein Triple lumen Temporary (Non-Tunneled)  Placement Date/Time: 10/31/21 0100   Placed prior to admission: No  Time Out: Correct patient  Maximum sterile barrier precautions: Hand hygiene;Cap;Mask;Sterile gown;Sterile gloves;Large sterile sheet  Site Prep: Chlorhexidine (preferred)  Ultrasound...  Site Condition No complications  Blue Lumen Status Flushed;Dead end cap in place  Red Lumen Status Flushed;Dead end cap in place  Catheter fill solution 4% Sodium Citrate  Catheter fill volume (Arterial) 1.4 cc  Catheter fill volume (Venous) 1.4  Dressing Type Transparent  Dressing Status Clean;Dry;Intact  Antimicrobial disc in place? Yes  Interventions Dressing changed;New dressing;Antimicrobial disc changed  Drainage Description None  Dressing Change Due 11/24/21  Post treatment catheter status Capped and Clamped   HD tx complete, UF goal met. Albumin 25 gm given x1, SBP noted in 80s x1. Albumin effective. Weight discrepancy noted post dialyisis, request made to primary nurse to calibrate and  reweigh if pt gets out of bed.
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
## 2021-11-17 NOTE — Progress Notes (Signed)
[facility] Progress Note     Assessment/ Plan:   Renal failure secondary to ATN in the setting of cardiac arrest, hypotension, pressors, contrast.  BL CKD3A with BL creatinine in the 1.17-1.3 range in late 2021. - R fem HD cath - daughter tells me plans to switch to IJ at some point mainly for comfort, would be great - should be tunneled if possible; platelets the main issue for this - CRRT 10/31/20 - 11/07/21 -HD 11/08/21--> had to have pressor with it - HD treatment 11/10/20--> cathether issues- wonder if BFR wasn't achieved--> BUN up, breathing worse, + fluid balance again, AMS so resumed CRRT 1/15 -18.  Hemodynamics improved.  - Tolerated iHF 1/19 with 2L UF.  HD today UF 1.5-2L.  Now that TPN off goals will probably be less.     Prolonged VF cardiac arrest--> OOH VF arrest 2/2 massive PE  Massive PE s/p mechanical thrombectomy 1/2 of R and L PA.  Vasc US- no DVT, liver mass noted 10/28/21 CT abd/ pelvis, s/p MRI abd--> incompletely characterized, had MRI with gadolinium 1/20  to reevaluate -- not definitive.      Retroperitoneal bleed/Hemorrhagic shock/Acute blood loss anemia originating from left liver lobe s/p embolization 1/4--> required FFP, plts, Vit K, pRBCs; now stable. Hb in 8s now.  Hematology following - deferring any ESA to them.   Thrombocytopenia- component of DIC suspected, HIT negative, on bivalirudin and avoiding heparin per heme, getting platelet transfusions PRN, heme involved, suspects ITP, getting Nplate PRN and dex.  Would need plt transfusion for line change.   DM: insulin per primary  Acute hypoxic RF- extubated 11/07/21, now weaned to RA  Sepsis/ leukocytosis/ aspiration pneumonia- MRI with aspiration PNA.  S/p  CTX and vanc   FEN: on TPN still, MBSS results noted 1/18 so going to try po intake; RD working with her to increase.   Dispo: ICU  Subjective:    No new issues.   On HD currently - tolerating fine.    Objective:   BP 108/60     Pulse 87    Temp 97.9 F (36.6 C) (Oral)    Resp 14    Ht 5\' 5"  (1.651 m)    Wt 62.2 kg    SpO2 93%    BMI 22.82 kg/m   Intake/Output Summary (Last 24 hours) at 11/17/2021 0941 Last data filed at 11/17/2021 0900 Gross per 24 hour  Intake 1073.49 ml  Output --  Net 1073.49 ml    Weight change:   Physical Exam: GEN: NAD,lying in bed HEENT:  sclerae anicteric NECK: Supple, no thyromegaly LUNGS: normal WOB on HFNC, coarse ant, no wheezing or rales CV: Reg rate ABD: soft, nontender EXT: edema resolved  Imaging: MR LIVER W WO CONTRAST  Result Date: 11/16/2021 CLINICAL DATA:  Intrahepatic hematoma on recent CT and MR. Patient underwent left hepatic artery angiogram a Gel-Foam embolization on 11/01/2021. EXAM: MRI ABDOMEN WITHOUT AND WITH CONTRAST TECHNIQUE: Multiplanar multisequence MR imaging of the abdomen was performed both before and after the administration of intravenous contrast. CONTRAST:  19mL MULTIHANCE GADOBENATE DIMEGLUMINE 529 MG/ML IV SOLN COMPARISON:  MRI 11/08/2021.  CT scan 10/30/2021. FINDINGS: Note: Fine anatomic detail is obscured by motion artifact on today's study as is contrast resolution. Lower chest: Left lower lobe collapse/consolidation. Expansion in the posterior right lower lobe has improved in the interval. Hepatobiliary: As before, there is a large 9.5 x 10.5 x 6.9 cm heterogeneous lesion/collection in the inferior aspect of the  lateral segment left liver. This lesion was measured at 8.5 x 10.0 x 7.0 cm previously. After IV contrast administration, there is some peripheral enhancement around the lesion. The fluid fluid level seen on the previous exam is less prominent today. Coronal imaging shows irregular thickened peripheral enhancement, most prominent along the superior margin (see coronal postcontrast image 91 of series 11). Gallbladder is markedly distended with multiple gallstones evident. Stones are noted in the neck of the gallbladder (coronal 15/3 and axial  27/4). No intrahepatic or extrahepatic biliary dilation. Pancreas: No focal mass lesion. No dilatation of the main duct. No intraparenchymal cyst. No peripancreatic edema. Fine detail of pancreatic anatomy obscured by motion artifact. Spleen: No gross abnormality in the spleen with fine detail obscured by motion artifact. Adrenals/Urinary Tract: No adrenal nodule or mass. Postcontrast imaging of the kidneys is severely limited by motion artifact. Pre contrast T2 imaging shows areas of apparent segmental edema in both kidneys. No hydronephrosis. Stomach/Bowel: Stomach is unremarkable. No gastric wall thickening. No evidence of outlet obstruction. Duodenum is normally positioned as is the ligament of Treitz. No small bowel or colonic dilatation within the visualized abdomen. Vascular/Lymphatic: No abdominal aortic aneurysm. No abdominal lymphadenopathy. Other:  No intraperitoneal free fluid. Musculoskeletal: No focal suspicious marrow enhancement within the visualized bony anatomy. IMPRESSION: 1. Limited study due to motion artifact. As before, a large lesion in the inferior aspect of the lateral segment left liver is again noted, stable to minimally increased in the interval. The fluid fluid level seen on the previous study is less prominent today. Peripheral enhancement noted around the lesion with irregular enhancing margins most prominent superiorly. While the bulk of this lesion does not enhance centrally, consistent with hematoma, given the irregular peripheral enhancement along the cranial margin, hemorrhagic neoplasm cannot be excluded. 2. Markedly distended gallbladder with gallstones. No evidence for choledocholithiasis. No intrahepatic or extrahepatic biliary dilation. 3. Areas of segmental edema in both kidneys. Pyelonephritis not excluded. 4. Left greater than right lower lobe collapse/consolidation. 5. Interval decrease in intraperitoneal free fluid Electronically Signed   By: Misty Stanley M.D.   On:  11/16/2021 10:27   DG Abd Portable 1V  Result Date: 11/16/2021 CLINICAL DATA:  Feeding tube placement. EXAM: PORTABLE ABDOMEN - 1 VIEW COMPARISON:  Abdominal x-ray 10/28/2021. FINDINGS: Enteric tube tip is at the level of the distal stomach. No dilated bowel loops are seen. Hyperdensity is seen within diverticula in the right colon. IMPRESSION: 1. Enteric tube tip at the level of the distal stomach. Electronically Signed   By: Ronney Asters M.D.   On: 11/16/2021 16:30    Labs: BMET Recent Labs  Lab 11/13/21 1557 11/14/21 0423 11/14/21 1722 11/15/21 0407 11/15/21 1601 11/16/21 0444 11/17/21 0516  NA 134* 135 132* 131* 140 131* 130*  K 4.3 3.9 4.8 4.3 3.7 3.9 5.0  CL 99 99 97* 96* 96* 102 96*  CO2 23 24 20* 19* 24 22 18*  GLUCOSE 185* 115* 214* 186* 183* 189* 314*  BUN 58* 47* 98* 133* 46* 95* 147*  CREATININE 1.86* 1.45* 2.81* 3.76* 1.77* 3.32* 5.22*  CALCIUM 8.3* 8.6* 8.4* 8.2* 9.3 7.2* 8.0*  PHOS 2.9 2.8 4.2 5.9* 2.7 5.2* 8.8*    CBC Recent Labs  Lab 11/14/21 0423 11/15/21 0407 11/16/21 0444 11/17/21 0515  WBC 33.7* 26.7* 14.6* 13.5*  HGB 11.1* 9.4* 8.2* 8.4*  HCT 34.6* 27.7* 25.0* 25.5*  MCV 88.9 88.2 89.0 89.2  PLT 14* 12* 12* 16*     Medications:  atorvastatin  20 mg Oral Daily   chlorhexidine  15 mL Mouth Rinse BID   Chlorhexidine Gluconate Cloth  6 each Topical Daily   Chlorhexidine Gluconate Cloth  6 each Topical Q0600   feeding supplement  237 mL Oral TID BM   feeding supplement (PROSource TF)  45 mL Per Tube BID   folic acid  2 mg Oral Daily   mouth rinse  15 mL Mouth Rinse BID   mouth rinse  15 mL Mouth Rinse q12n4p   midodrine  10 mg Oral TID WC   pantoprazole (PROTONIX) IV  40 mg Intravenous Q1400    Jannifer Hick MD [facility] Pager [phone]

## 2021-11-17 NOTE — Progress Notes (Signed)
ANTICOAGULATION CONSULT NOTE  Pharmacy Consult for bivalirudin Indication: pulmonary embolus  Allergies  Allergen Reactions   Aspirin Nausea And Vomiting    Other reaction(s): Unknown   Benadryl [Diphenhydramine]     Can only take dye free. States the dye causes itching.   Darvon [Propoxyphene] Itching    Can only during the day. Night time makes her itch   Hydrocodone Bit-Homatrop Mbr Itching   Metformin     Other reaction(s): GI   Other     Other reaction(s): Unknown. States reaction was to nasal spray that caused pupils to shrink   Pentazocine     nervous Other reaction(s): jitters   Percocet [Oxycodone-Acetaminophen]     itching   Sulfa Antibiotics Hives   Tetracaine Hcl     Other reaction(s): Unknown. Thinks caused itching.   Tetracyclines & Related Hives   Tramadol     Other reaction(s): itch    Patient Measurements: Height: 5\' 5"  (165.1 cm) Weight: 71.1 kg (156 lb 12 oz) IBW/kg (Calculated) : 57  Vital Signs: Temp: 97.9 F (36.6 C) (01/21 0000) Temp Source: Oral (01/21 0000) BP: 106/52 (01/21 0700) Pulse Rate: 80 (01/21 0700)  Labs: Recent Labs    11/15/21 0407 11/15/21 1601 11/16/21 0444 11/16/21 1703 11/17/21 0515 11/17/21 0516  HGB 9.4*  --  8.2*  --  8.4*  --   HCT 27.7*  --  25.0*  --  25.5*  --   PLT 12*  --  12*  --  16*  --   APTT 69* 70* 71* 72* 76*  --   CREATININE 3.76* 1.77* 3.32*  --   --  5.22*     Estimated Creatinine Clearance: 9.5 mL/min (A) (by C-G formula based on SCr of 5.22 mg/dL (H)).   Assessment: 71 yo female with PE s/p thrombectomy, negative HIT, on bivalirudin.   aPTT today came back therapeutic at 76 on 0.09 mg/kg bilvalirudin. Hgb 8.4, plt low but increased up to 16. LDH 584. No s/sx of bleeding or infusion issues.    Goal of Therapy:  PTT goal 50-85 sec Monitor platelets by anticoagulation protocol: Yes   Plan:  Continue bivalirudin at 0.09 mg/kg/hr Will monitor aPTT every 12 hours   Antonietta Jewel,  PharmD, [place] Pharmacist  Phone: [phone] 11/17/2021 7:16 AM  Please check AMION for all [address] phone numbers After 10:00 PM, call [place] [phone]

## 2021-11-17 NOTE — Progress Notes (Signed)
ANTICOAGULATION CONSULT NOTE  Pharmacy Consult for bivalirudin Indication: pulmonary embolus  Allergies  Allergen Reactions   Aspirin Nausea And Vomiting    Other reaction(s): Unknown   Benadryl [Diphenhydramine]     Can only take dye free. States the dye causes itching.   Darvon [Propoxyphene] Itching    Can only during the day. Night time makes her itch   Hydrocodone Bit-Homatrop Mbr Itching   Metformin     Other reaction(s): GI   Other     Other reaction(s): Unknown. States reaction was to nasal spray that caused pupils to shrink   Pentazocine     nervous Other reaction(s): jitters   Percocet [Oxycodone-Acetaminophen]     itching   Sulfa Antibiotics Hives   Tetracaine Hcl     Other reaction(s): Unknown. Thinks caused itching.   Tetracyclines & Related Hives   Tramadol     Other reaction(s): itch    Patient Measurements: Height: 5\' 5"  (165.1 cm) Weight: 62.2 kg (137 lb 2 oz) IBW/kg (Calculated) : 57  Vital Signs: Temp: 98.4 F (36.9 C) (01/21 1543) Temp Source: Oral (01/21 1543) BP: 106/43 (01/21 1800) Pulse Rate: 72 (01/21 1800)  Labs: Recent Labs    11/15/21 0407 11/15/21 1601 11/16/21 0444 11/16/21 1703 11/17/21 0515 11/17/21 0516 11/17/21 1710  HGB 9.4*  --  8.2*  --  8.4*  --   --   HCT 27.7*  --  25.0*  --  25.5*  --   --   PLT 12*  --  12*  --  16*  --   --   APTT 69* 70* 71* 72* 76*  --  70*  CREATININE 3.76* 1.77* 3.32*  --   --  5.22*  --      Estimated Creatinine Clearance: 8.6 mL/min (A) (by C-G formula based on SCr of 5.22 mg/dL (H)).   Assessment: 71 yo female with PE s/p thrombectomy, negative HIT, on bivalirudin.   aPTT remains therapeutic at 70 on 0.09 mg/kg bilvalirudin.    Goal of Therapy:  PTT goal 50-85 sec Monitor platelets by anticoagulation protocol: Yes   Plan:  Continue bivalirudin at 0.09 mg/kg/hr Will monitor aPTT every 12 hours   Hildred Laser, PharmD Clinical Pharmacist **Pharmacist phone directory can now  be found on [email or web address] ([email or web address]).  Listed under [place].

## 2021-11-17 NOTE — Progress Notes (Signed)
[name] now has a feeding tube in.  Hopefully this is a good sign for [name].  Unfortunately, her renal function is clearly declining.  This morning, her BUN was 147 creatinine 5.22.  Her calcium is 8.  Her white cell count is 13.5.  Hemoglobin 8.4.  Platelet count is 16,000.  Her LDH was coming down.  It was 547.  This I think is indicative of her liver.  Her liver tests seem to be improving.  Her platelet count definitely is taking his sweet time improving.  At some point, we do not see any further improvement over the weekend, we may want to consider a bone marrow biopsy on her.  Unfortunately, the MRI of the liver is not definitive at all.  The radiologist cannot tell us if there is an underlying malignancy.  There is definitely blood in this tumor mass.  I think the only way that were going to know if there is cancer is going to be with a biopsy which I does do not think is going to be done anytime soon with her platelet count being so low.  I guess she will have to be on hemodialysis.  She sounds good.  She actually looks better than I would have thought given the numbers.  Again, we may have to think about doing a bone marrow test on her.  I still have to think that what we are looking at is an immune mediated problem.  Again we could be looking at an issue with bone marrow failure.  Now that her liver is improving, maybe her platelet count will follow along.  She continues on the bivalirudin.  I still think we need this given that she likely has an underlying hypercoagulable state.  Again this is incredibly complicated.  The staff in the CCU are doing a fantastic job.  There are a lot of issues that need to be attended to.  As always, we had a very good prayer.  Her faith remains very strong.  [name], MD  Psalms 41:3

## 2021-11-18 LAB — MAGNESIUM: Magnesium: 1.9 mg/dL (ref 1.7–2.4)

## 2021-11-18 LAB — GLUCOSE, CAPILLARY
Glucose-Capillary: 135 mg/dL — ABNORMAL HIGH (ref 70–99)
Glucose-Capillary: 161 mg/dL — ABNORMAL HIGH (ref 70–99)
Glucose-Capillary: 164 mg/dL — ABNORMAL HIGH (ref 70–99)
Glucose-Capillary: 166 mg/dL — ABNORMAL HIGH (ref 70–99)
Glucose-Capillary: 183 mg/dL — ABNORMAL HIGH (ref 70–99)
Glucose-Capillary: 183 mg/dL — ABNORMAL HIGH (ref 70–99)
Glucose-Capillary: 185 mg/dL — ABNORMAL HIGH (ref 70–99)
Glucose-Capillary: 189 mg/dL — ABNORMAL HIGH (ref 70–99)
Glucose-Capillary: 190 mg/dL — ABNORMAL HIGH (ref 70–99)
Glucose-Capillary: 196 mg/dL — ABNORMAL HIGH (ref 70–99)
Glucose-Capillary: 204 mg/dL — ABNORMAL HIGH (ref 70–99)
Glucose-Capillary: 210 mg/dL — ABNORMAL HIGH (ref 70–99)
Glucose-Capillary: 233 mg/dL — ABNORMAL HIGH (ref 70–99)
Glucose-Capillary: 241 mg/dL — ABNORMAL HIGH (ref 70–99)
Glucose-Capillary: 247 mg/dL — ABNORMAL HIGH (ref 70–99)

## 2021-11-18 LAB — RENAL FUNCTION PANEL
Albumin: 2.8 g/dL — ABNORMAL LOW (ref 3.5–5.0)
Anion gap: 12 (ref 5–15)
BUN: 105 mg/dL — ABNORMAL HIGH (ref 8–23)
CO2: 21 mmol/L — ABNORMAL LOW (ref 22–32)
Calcium: 8.1 mg/dL — ABNORMAL LOW (ref 8.9–10.3)
Chloride: 100 mmol/L (ref 98–111)
Creatinine, Ser: 4.32 mg/dL — ABNORMAL HIGH (ref 0.44–1.00)
GFR, Estimated: 10 mL/min — ABNORMAL LOW (ref >60)
Glucose, Bld: 203 mg/dL — ABNORMAL HIGH (ref 70–99)
Phosphorus: 7.9 mg/dL — ABNORMAL HIGH (ref 2.5–4.6)
Potassium: 5.1 mmol/L (ref 3.5–5.1)
Sodium: 133 mmol/L — ABNORMAL LOW (ref 135–145)

## 2021-11-18 LAB — CBC
HCT: 24.4 % — ABNORMAL LOW (ref 36.0–46.0)
Hemoglobin: 8.2 g/dL — ABNORMAL LOW (ref 12.0–15.0)
MCH: 30 pg (ref 26.0–34.0)
MCHC: 33.6 g/dL (ref 30.0–36.0)
MCV: 89.4 fL (ref 80.0–100.0)
Platelets: 19 10*3/uL — CL (ref 150–400)
RBC: 2.73 MIL/uL — ABNORMAL LOW (ref 3.87–5.11)
RDW: 20.6 % — ABNORMAL HIGH (ref 11.5–15.5)
WBC: 12.6 10*3/uL — ABNORMAL HIGH (ref 4.0–10.5)
nRBC: 0 % (ref 0.0–0.2)

## 2021-11-18 LAB — ADAMTS13 ACTIVITY REFLEX

## 2021-11-18 LAB — ADAMTS13 ACTIVITY: Adamts 13 Activity: 54.6 % — ABNORMAL LOW (ref >66.8)

## 2021-11-18 LAB — APTT
aPTT: 80 seconds — ABNORMAL HIGH (ref 24–36)
aPTT: 67 seconds — ABNORMAL HIGH (ref 24–36)

## 2021-11-18 MED ORDER — INSULIN ASPART 100 UNIT/ML IJ SOLN
3.0000 [IU] | INTRAMUSCULAR | Status: DC
  Administered 2021-11-18 – 2021-11-19 (×6): 3 [IU] via SUBCUTANEOUS

## 2021-11-18 MED ORDER — INSULIN DETEMIR 100 UNIT/ML ~~LOC~~ SOLN
12.0000 [IU] | Freq: Two times a day (BID) | SUBCUTANEOUS | Status: DC
  Administered 2021-11-18 – 2021-11-19 (×4): 12 [IU] via SUBCUTANEOUS
  Filled 2021-11-18 (×6): qty 0.12

## 2021-11-18 MED ORDER — INSULIN ASPART 100 UNIT/ML IJ SOLN
0.0000 [IU] | INTRAMUSCULAR | Status: DC
  Administered 2021-11-18 (×3): 2 [IU] via SUBCUTANEOUS
  Administered 2021-11-18: 3 [IU] via SUBCUTANEOUS
  Administered 2021-11-19: 2 [IU] via SUBCUTANEOUS
  Administered 2021-11-19: 1 [IU] via SUBCUTANEOUS
  Administered 2021-11-19 (×2): 3 [IU] via SUBCUTANEOUS
  Administered 2021-11-20 (×2): 1 [IU] via SUBCUTANEOUS
  Administered 2021-11-21: 19:00:00 3 [IU] via SUBCUTANEOUS
  Administered 2021-11-21: 13:00:00 2 [IU] via SUBCUTANEOUS
  Administered 2021-11-21: 5 [IU] via SUBCUTANEOUS
  Administered 2021-11-21: 04:00:00 2 [IU] via SUBCUTANEOUS
  Administered 2021-11-22: 3 [IU] via SUBCUTANEOUS
  Administered 2021-11-22: 1 [IU] via SUBCUTANEOUS
  Administered 2021-11-22: 2 [IU] via SUBCUTANEOUS
  Administered 2021-11-22: 3 [IU] via SUBCUTANEOUS
  Administered 2021-11-23: 2 [IU] via SUBCUTANEOUS
  Administered 2021-11-23: 5 [IU] via SUBCUTANEOUS
  Administered 2021-11-23: 2 [IU] via SUBCUTANEOUS
  Administered 2021-11-24: 3 [IU] via SUBCUTANEOUS
  Administered 2021-11-24: 2 [IU] via SUBCUTANEOUS
  Administered 2021-11-24: 5 [IU] via SUBCUTANEOUS
  Administered 2021-11-24 – 2021-11-25 (×2): 2 [IU] via SUBCUTANEOUS
  Administered 2021-11-25: 3 [IU] via SUBCUTANEOUS
  Administered 2021-11-25: 5 [IU] via SUBCUTANEOUS
  Administered 2021-11-25: 3 [IU] via SUBCUTANEOUS
  Administered 2021-11-25: 2 [IU] via SUBCUTANEOUS
  Administered 2021-11-25: 1 [IU] via SUBCUTANEOUS
  Administered 2021-11-25: 2 [IU] via SUBCUTANEOUS
  Administered 2021-11-26: 1 [IU] via SUBCUTANEOUS
  Administered 2021-11-27 (×2): 2 [IU] via SUBCUTANEOUS
  Administered 2021-11-27 (×2): 3 [IU] via SUBCUTANEOUS
  Administered 2021-11-27 – 2021-11-28 (×2): 1 [IU] via SUBCUTANEOUS
  Administered 2021-11-28: 2 [IU] via SUBCUTANEOUS
  Administered 2021-11-28: 1 [IU] via SUBCUTANEOUS
  Administered 2021-11-28 (×2): 2 [IU] via SUBCUTANEOUS
  Administered 2021-11-29: 3 [IU] via SUBCUTANEOUS
  Administered 2021-11-29 – 2021-11-30 (×3): 2 [IU] via SUBCUTANEOUS
  Administered 2021-11-30: 1 [IU] via SUBCUTANEOUS
  Administered 2021-11-30: 5 [IU] via SUBCUTANEOUS
  Administered 2021-11-30: 2 [IU] via SUBCUTANEOUS
  Administered 2021-12-01: 5 [IU] via SUBCUTANEOUS
  Administered 2021-12-01: 2 [IU] via SUBCUTANEOUS
  Administered 2021-12-01: 1 [IU] via SUBCUTANEOUS
  Administered 2021-12-01: 2 [IU] via SUBCUTANEOUS
  Administered 2021-12-02: 3 [IU] via SUBCUTANEOUS
  Administered 2021-12-02: 2 [IU] via SUBCUTANEOUS
  Administered 2021-12-02: 1 [IU] via SUBCUTANEOUS
  Administered 2021-12-02: 7 [IU] via SUBCUTANEOUS
  Administered 2021-12-04: 2 [IU] via SUBCUTANEOUS

## 2021-11-18 NOTE — Progress Notes (Signed)
ANTICOAGULATION CONSULT NOTE  Pharmacy Consult for bivalirudin Indication: pulmonary embolus  Allergies  Allergen Reactions   Aspirin Nausea And Vomiting    Other reaction(s): Unknown   Benadryl [Diphenhydramine]     Can only take dye free. States the dye causes itching.   Darvon [Propoxyphene] Itching    Can only during the day. Night time makes her itch   Hydrocodone Bit-Homatrop Mbr Itching   Metformin     Other reaction(s): GI   Other     Other reaction(s): Unknown. States reaction was to nasal spray that caused pupils to shrink   Pentazocine     nervous Other reaction(s): jitters   Percocet [Oxycodone-Acetaminophen]     itching   Sulfa Antibiotics Hives   Tetracaine Hcl     Other reaction(s): Unknown. Thinks caused itching.   Tetracyclines & Related Hives   Tramadol     Other reaction(s): itch    Patient Measurements: Height: 5\' 5"  (165.1 cm) Weight: 71.1 kg (156 lb 12 oz) (bed rezeroed) IBW/kg (Calculated) : 57  Vital Signs: Temp: 97.1 F (36.2 C) (01/22 0730) Temp Source: Oral (01/22 0730) BP: 113/56 (01/22 0700) Pulse Rate: 80 (01/22 0700)  Labs: Recent Labs    11/16/21 0444 11/16/21 1703 11/17/21 0515 11/17/21 0516 11/17/21 1710 11/18/21 0604  HGB 8.2*  --  8.4*  --   --  8.2*  HCT 25.0*  --  25.5*  --   --  24.4*  PLT 12*  --  16*  --   --  19*  APTT 71*   < > 76*  --  70* 80*  CREATININE 3.32*  --   --  5.22*  --  4.32*   < > = values in this interval not displayed.     Estimated Creatinine Clearance: 11.5 mL/min (A) (by C-G formula based on SCr of 4.32 mg/dL (H)).   Assessment: 71 yo female with PE s/p thrombectomy, negative HIT, on bivalirudin.   aPTT remains therapeutic at 80, on 0.09 mg/kg bilvalirudin. Hgb 8.2, plt trending up further today to 19. No s/sx of bleeding or infusion issues.  Goal of Therapy:  PTT goal 50-85 sec Monitor platelets by anticoagulation protocol: Yes   Plan:  Continue bivalirudin at 0.09 mg/kg/hr Will  monitor aPTT every 12 hours   Antonietta Jewel, PharmD, [place] Pharmacist  Phone: [phone] 11/18/2021 7:37 AM  Please check AMION for all [place] phone numbers After 10:00 PM, call [place] [phone]
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
## 2021-11-18 NOTE — Progress Notes (Signed)
[facility] Progress Note     Assessment/ Plan:   Renal failure secondary to ATN in the setting of cardiac arrest, hypotension, pressors, contrast.  BL CKD3A with BL creatinine in the 1.17-1.3 range in late 2021. - R fem HD cath - daughter tells me plans to switch to IJ at some point mainly for comfort, would be great - should be tunneled if possible; platelets the main issue for this - CRRT 10/31/20 - 11/07/21 -HD 11/08/21--> had to have pressor with it - HD treatment 11/10/20--> cathether issues- wonder if BFR wasn't achieved--> BUN up, breathing worse, + fluid balance again, AMS so resumed CRRT 1/15 -18.  Hemodynamics improved.  - Tolerated iHF 1/19, 1/21.  Next planned would be Tues.     Prolonged VF cardiac arrest--> OOH VF arrest 2/2 massive PE  Massive PE s/p mechanical thrombectomy 1/2 of R and L PA.  Vasc US- no DVT, liver mass noted 10/28/21 CT abd/ pelvis, s/p MRI abd--> incompletely characterized, had MRI with gadolinium 1/20  to reevaluate -- not definitive.      Retroperitoneal bleed/Hemorrhagic shock/Acute blood loss anemia originating from left liver lobe s/p embolization 1/4--> required FFP, plts, Vit K, pRBCs; now stable. Hb in 8s now.  Hematology following - deferring any ESA to them.   Thrombocytopenia- component of DIC suspected, HIT negative, on bivalirudin and avoiding heparin per heme, getting platelet transfusions PRN, heme involved, suspects ITP, getting Nplate PRN and dex.  Would need plt transfusion for line change.   DM: insulin per primary  Acute hypoxic RF- extubated 11/07/21, now weaned to RA  Sepsis/ leukocytosis/ aspiration pneumonia- MRI with aspiration PNA.  S/p  CTX and vanc   FEN: off TPN now, MBSS results noted 1/18 so going to try po intake; RD working with her to increase.   Dispo: ICU  Subjective:    No new issues.   Tolerated HD yesterday with 2L (that was the goal).   Objective:   BP (!) 113/56    Pulse 80    Temp (!) 97.1 F  (36.2 C) (Oral)    Resp 14    Ht 5\' 5"  (1.651 m)    Wt 71.1 kg Comment: bed rezeroed   SpO2 95%    BMI 26.08 kg/m   Intake/Output Summary (Last 24 hours) at 11/18/2021 7948 Last data filed at 11/18/2021 0400 Gross per 24 hour  Intake 683.67 ml  Output 2000 ml  Net -1316.33 ml    Weight change:   Physical Exam: GEN: NAD,lying in bed HEENT:  sclerae anicteric NECK: Supple, no thyromegaly LUNGS: normal WOB on HFNC, coarse ant, no wheezing or rales CV: Reg rate ABD: soft, nontender EXT: edema resolved  Imaging: MR LIVER W WO CONTRAST  Result Date: 11/16/2021 CLINICAL DATA:  Intrahepatic hematoma on recent CT and MR. Patient underwent left hepatic artery angiogram a Gel-Foam embolization on 11/01/2021. EXAM: MRI ABDOMEN WITHOUT AND WITH CONTRAST TECHNIQUE: Multiplanar multisequence MR imaging of the abdomen was performed both before and after the administration of intravenous contrast. CONTRAST:  85mL MULTIHANCE GADOBENATE DIMEGLUMINE 529 MG/ML IV SOLN COMPARISON:  MRI 11/08/2021.  CT scan 10/30/2021. FINDINGS: Note: Fine anatomic detail is obscured by motion artifact on today's study as is contrast resolution. Lower chest: Left lower lobe collapse/consolidation. Expansion in the posterior right lower lobe has improved in the interval. Hepatobiliary: As before, there is a large 9.5 x 10.5 x 6.9 cm heterogeneous lesion/collection in the inferior aspect of the lateral segment left liver.  This lesion was measured at 8.5 x 10.0 x 7.0 cm previously. After IV contrast administration, there is some peripheral enhancement around the lesion. The fluid fluid level seen on the previous exam is less prominent today. Coronal imaging shows irregular thickened peripheral enhancement, most prominent along the superior margin (see coronal postcontrast image 91 of series 11). Gallbladder is markedly distended with multiple gallstones evident. Stones are noted in the neck of the gallbladder (coronal 15/3 and axial  27/4). No intrahepatic or extrahepatic biliary dilation. Pancreas: No focal mass lesion. No dilatation of the main duct. No intraparenchymal cyst. No peripancreatic edema. Fine detail of pancreatic anatomy obscured by motion artifact. Spleen: No gross abnormality in the spleen with fine detail obscured by motion artifact. Adrenals/Urinary Tract: No adrenal nodule or mass. Postcontrast imaging of the kidneys is severely limited by motion artifact. Pre contrast T2 imaging shows areas of apparent segmental edema in both kidneys. No hydronephrosis. Stomach/Bowel: Stomach is unremarkable. No gastric wall thickening. No evidence of outlet obstruction. Duodenum is normally positioned as is the ligament of Treitz. No small bowel or colonic dilatation within the visualized abdomen. Vascular/Lymphatic: No abdominal aortic aneurysm. No abdominal lymphadenopathy. Other:  No intraperitoneal free fluid. Musculoskeletal: No focal suspicious marrow enhancement within the visualized bony anatomy. IMPRESSION: 1. Limited study due to motion artifact. As before, a large lesion in the inferior aspect of the lateral segment left liver is again noted, stable to minimally increased in the interval. The fluid fluid level seen on the previous study is less prominent today. Peripheral enhancement noted around the lesion with irregular enhancing margins most prominent superiorly. While the bulk of this lesion does not enhance centrally, consistent with hematoma, given the irregular peripheral enhancement along the cranial margin, hemorrhagic neoplasm cannot be excluded. 2. Markedly distended gallbladder with gallstones. No evidence for choledocholithiasis. No intrahepatic or extrahepatic biliary dilation. 3. Areas of segmental edema in both kidneys. Pyelonephritis not excluded. 4. Left greater than right lower lobe collapse/consolidation. 5. Interval decrease in intraperitoneal free fluid Electronically Signed   By: Misty Stanley M.D.   On:  11/16/2021 10:27   DG Abd Portable 1V  Result Date: 11/16/2021 CLINICAL DATA:  Feeding tube placement. EXAM: PORTABLE ABDOMEN - 1 VIEW COMPARISON:  Abdominal x-ray 10/28/2021. FINDINGS: Enteric tube tip is at the level of the distal stomach. No dilated bowel loops are seen. Hyperdensity is seen within diverticula in the right colon. IMPRESSION: 1. Enteric tube tip at the level of the distal stomach. Electronically Signed   By: Ronney Asters M.D.   On: 11/16/2021 16:30    Labs: BMET Recent Labs  Lab 11/14/21 0423 11/14/21 1722 11/15/21 0407 11/15/21 1601 11/16/21 0444 11/17/21 0516 11/18/21 0604  NA 135 132* 131* 140 131* 130* 133*  K 3.9 4.8 4.3 3.7 3.9 5.0 5.1  CL 99 97* 96* 96* 102 96* 100  CO2 24 20* 19* 24 22 18* 21*  GLUCOSE 115* 214* 186* 183* 189* 314* 203*  BUN 47* 98* 133* 46* 95* 147* 105*  CREATININE 1.45* 2.81* 3.76* 1.77* 3.32* 5.22* 4.32*  CALCIUM 8.6* 8.4* 8.2* 9.3 7.2* 8.0* 8.1*  PHOS 2.8 4.2 5.9* 2.7 5.2* 8.8* 7.9*    CBC Recent Labs  Lab 11/15/21 0407 11/16/21 0444 11/17/21 0515 11/18/21 0604  WBC 26.7* 14.6* 13.5* 12.6*  HGB 9.4* 8.2* 8.4* 8.2*  HCT 27.7* 25.0* 25.5* 24.4*  MCV 88.2 89.0 89.2 89.4  PLT 12* 12* 16* 19*     Medications:  atorvastatin  20 mg Oral Daily   B-complex with vitamin C  1 tablet Oral Daily   chlorhexidine  15 mL Mouth Rinse BID   Chlorhexidine Gluconate Cloth  6 each Topical Daily   Chlorhexidine Gluconate Cloth  6 each Topical Q0600   feeding supplement  237 mL Oral TID BM   feeding supplement (PROSource TF)  45 mL Per Tube BID   folic acid  2 mg Oral Daily   insulin aspart  0-9 Units Subcutaneous Q4H   insulin aspart  3 Units Subcutaneous Q4H   insulin detemir  12 Units Subcutaneous BID   mouth rinse  15 mL Mouth Rinse q12n4p   midodrine  10 mg Oral TID WC   pantoprazole (PROTONIX) IV  40 mg Intravenous Q1400    Jannifer Hick MD [facility] Pager [phone]

## 2021-11-18 NOTE — Progress Notes (Signed)
ANTICOAGULATION CONSULT NOTE  Pharmacy Consult for bivalirudin Indication: pulmonary embolus  Allergies  Allergen Reactions   Aspirin Nausea And Vomiting    Other reaction(s): Unknown   Benadryl [Diphenhydramine]     Can only take dye free. States the dye causes itching.   Darvon [Propoxyphene] Itching    Can only during the day. Night time makes her itch   Hydrocodone Bit-[place]rop Mbr Itching   Metformin     Other reaction(s): GI   Other     Other reaction(s): Unknown. States reaction was to nasal spray that caused pupils to shrink   Pentazocine     nervous Other reaction(s): jitters   Percocet [Oxycodone-Acetaminophen]     itching   Sulfa Antibiotics Hives   Tetracaine Hcl     Other reaction(s): Unknown. Thinks caused itching.   Tetracyclines & Related Hives   Tramadol     Other reaction(s): itch    Patient Measurements: Height: 5\' 5"  (165.1 cm) Weight: 71.1 kg (156 lb 12 oz) (bed rezeroed) IBW/kg (Calculated) : 57  Vital Signs: Temp: 97.7 F (36.5 C) (01/22 1545) Temp Source: Oral (01/22 1545) BP: 134/55 (01/22 1800) Pulse Rate: 78 (01/22 1800)  Labs: Recent Labs    11/16/21 0444 11/16/21 1703 11/17/21 0515 11/17/21 0516 11/17/21 1710 11/18/21 0604 11/18/21 1850  HGB 8.2*  --  8.4*  --   --  8.2*  --   HCT 25.0*  --  25.5*  --   --  24.4*  --   PLT 12*  --  16*  --   --  19*  --   APTT 71*   < > 76*  --  70* 80* 67*  CREATININE 3.32*  --   --  5.22*  --  4.32*  --    < > = values in this interval not displayed.     Estimated Creatinine Clearance: 11.5 mL/min (A) (by C-G formula based on SCr of 4.32 mg/dL (H)).   Assessment: 71 yo female with PE s/p thrombectomy, negative HIT, on bivalirudin.   aPTT remains therapeutic at 67, on 0.09 mg/kg bilvalirudin.   Goal of Therapy:  PTT goal 50-85 sec Monitor platelets by anticoagulation protocol: Yes   Plan:  Continue bivalirudin at 0.09 mg/kg/hr Will monitor aPTT every 12 hours   Hildred Laser,  PharmD Clinical Pharmacist **Pharmacist phone directory can now be found on [email or web address] ([email or web address]).  Listed under [place].

## 2021-11-19 ENCOUNTER — Inpatient Hospital Stay (HOSPITAL_COMMUNITY): Payer: Medicare PPO

## 2021-11-19 DIAGNOSIS — D649 Anemia, unspecified: Secondary | ICD-10-CM

## 2021-11-19 DIAGNOSIS — E46 Unspecified protein-calorie malnutrition: Secondary | ICD-10-CM | POA: Diagnosis not present

## 2021-11-19 DIAGNOSIS — I469 Cardiac arrest, cause unspecified: Secondary | ICD-10-CM | POA: Diagnosis not present

## 2021-11-19 LAB — CBC
HCT: 25.6 % — ABNORMAL LOW (ref 36.0–46.0)
Hemoglobin: 8.4 g/dL — ABNORMAL LOW (ref 12.0–15.0)
MCH: 29.2 pg (ref 26.0–34.0)
MCHC: 32.8 g/dL (ref 30.0–36.0)
MCV: 88.9 fL (ref 80.0–100.0)
Platelets: 29 10*3/uL — CL (ref 150–400)
RBC: 2.88 MIL/uL — ABNORMAL LOW (ref 3.87–5.11)
RDW: 19.7 % — ABNORMAL HIGH (ref 11.5–15.5)
WBC: 14.4 10*3/uL — ABNORMAL HIGH (ref 4.0–10.5)
nRBC: 0 % (ref 0.0–0.2)

## 2021-11-19 LAB — RENAL FUNCTION PANEL
Albumin: 2.8 g/dL — ABNORMAL LOW (ref 3.5–5.0)
Anion gap: 15 (ref 5–15)
BUN: 143 mg/dL — ABNORMAL HIGH (ref 8–23)
CO2: 18 mmol/L — ABNORMAL LOW (ref 22–32)
Calcium: 8.2 mg/dL — ABNORMAL LOW (ref 8.9–10.3)
Chloride: 101 mmol/L (ref 98–111)
Creatinine, Ser: 5.94 mg/dL — ABNORMAL HIGH (ref 0.44–1.00)
GFR, Estimated: 7 mL/min — ABNORMAL LOW (ref >60)
Glucose, Bld: 228 mg/dL — ABNORMAL HIGH (ref 70–99)
Phosphorus: 10.5 mg/dL — ABNORMAL HIGH (ref 2.5–4.6)
Potassium: 6.2 mmol/L — ABNORMAL HIGH (ref 3.5–5.1)
Sodium: 134 mmol/L — ABNORMAL LOW (ref 135–145)

## 2021-11-19 LAB — GLUCOSE, CAPILLARY
Glucose-Capillary: 237 mg/dL — ABNORMAL HIGH (ref 70–99)
Glucose-Capillary: 201 mg/dL — ABNORMAL HIGH (ref 70–99)
Glucose-Capillary: 138 mg/dL — ABNORMAL HIGH (ref 70–99)
Glucose-Capillary: 82 mg/dL (ref 70–99)
Glucose-Capillary: 160 mg/dL — ABNORMAL HIGH (ref 70–99)
Glucose-Capillary: 145 mg/dL — ABNORMAL HIGH (ref 70–99)

## 2021-11-19 LAB — MAGNESIUM: Magnesium: 2 mg/dL (ref 1.7–2.4)

## 2021-11-19 LAB — APTT
aPTT: 77 seconds — ABNORMAL HIGH (ref 24–36)
aPTT: 58 seconds — ABNORMAL HIGH (ref 24–36)

## 2021-11-19 MED ORDER — CEFAZOLIN SODIUM-DEXTROSE 2-4 GM/100ML-% IV SOLN: 2.0000 g | INTRAVENOUS | Status: AC

## 2021-11-19 MED ORDER — NEPRO/CARBSTEADY PO LIQD
237.0000 mL | Freq: Three times a day (TID) | ORAL | Status: DC
  Administered 2021-11-19 – 2021-12-04 (×19): 237 mL via ORAL

## 2021-11-19 MED ORDER — CHLORHEXIDINE GLUCONATE CLOTH 2 % EX PADS
6.0000 | MEDICATED_PAD | Freq: Every day | CUTANEOUS | Status: DC
  Administered 2021-11-19 – 2021-11-20 (×2): 6 via TOPICAL

## 2021-11-19 MED ORDER — PANTOPRAZOLE SODIUM 40 MG PO TBEC: 40.0000 mg | DELAYED_RELEASE_TABLET | Freq: Every day | ORAL | Status: DC

## 2021-11-19 MED ORDER — INSULIN ASPART 100 UNIT/ML IJ SOLN
5.0000 [IU] | INTRAMUSCULAR | Status: DC
  Administered 2021-11-19 (×3): 5 [IU] via SUBCUTANEOUS

## 2021-11-19 NOTE — Progress Notes (Signed)
Occupational Therapy Treatment Patient Details Name: [name] MRN: [id number] DOB: [birth year] Today's Date: 11/19/2021   History of present illness Pt is a 71 y.o. female admitted 10/28/21 after cardiac arrest requiring 20-min CPR for ROSC; ETT in the field. Pt had 2x additional codes in ED requiring CPR for 4 and 2-min. Workup for STEMI, cardiogenic shock, large saddle PE, RUQ bleeding, 2 rib fxs. S/p PE thrombectomy 1/2. Pt with intraperitoneal hemorrhage s/p embolization 1/5. ETT 1/1-1/10. CRRT 1/4-1/11; trialled iHD 1/12; CRRT again 1/15-1/18. PMH includes fibromyalgia, HTN, DM2, CKD, GERD, asthma.   OT comments  Pt continues to present with decreased balance and activity tolerance. Requiring Max A +2 for power up into standing and use of stedy for transfer to recliner. Pt reporting she is excited to be out of the bed. Pt performing grooming while seated in recliner; however, fatigued quickly. Continue to recommend dc to AIR and will continue to follow acutely as admitted.    Recommendations for follow up therapy are one component of a multi-disciplinary discharge planning process, led by the attending physician.  Recommendations may be updated based on patient status, additional functional criteria and insurance authorization.    Follow Up Recommendations  Acute inpatient rehab (3hours/day)    Assistance Recommended at Discharge Frequent or constant Supervision/Assistance  Patient can return home with the following  Two people to help with walking and/or transfers;A lot of help with bathing/dressing/bathroom;Assistance with cooking/housework   Equipment Recommendations  Other (comment) (TBD next venue)    Recommendations for Other Services Rehab consult    Precautions / Restrictions Precautions Precautions: Fall Precaution Comments: L femoral non-tunneled HD cath - MD cleared getting up with fem catheter Restrictions Weight Bearing Restrictions: No       Mobility Bed  Mobility Overal bed mobility: Needs Assistance Bed Mobility: Supine to Sit   Sidelying to sit: Mod assist, HOB elevated, +2 for physical assistance       General bed mobility comments: Assist to elevate trunk into sitting and bringing hips to EOB.    Transfers Overall transfer level: Needs assistance Equipment used: Ambulation equipment used Transfers: Sit to/from Stand, Bed to chair/wheelchair/BSC Sit to Stand: Max assist, +2 physical assistance           General transfer comment: sit<>stand with Max A +2 for power up. Use of stedy for transfer to recliner. COmpleting two sit<>stand trials Transfer via Lift Equipment: Stedy   Balance Overall balance assessment: Needs assistance Sitting-balance support: No upper extremity supported, Feet supported Sitting balance-Leahy Scale: Fair Sitting balance - Comments: Static sitting with min guard for safety and lines   Standing balance support: Bilateral upper extremity supported, Reliant on assistive device for balance Standing balance-Leahy Scale: Poor Standing balance comment: Reliant on BUE support and external assist for static standing in stedy frame                           ADL either performed or assessed with clinical judgement   ADL Overall ADL's : Needs assistance/impaired     Grooming: Sitting;Wash/dry face;Set up Grooming Details (indicate cue type and reason): Sitting in recliner with trunk support. providing pt with warm wash clothe. Increased time due to fatigue to wash face                 Toilet Transfer: Maximal assistance;+2 for physical assistance;+2 for safety/equipment (sit<>stand with stedy to transfer to recliner)   [place] and Hygiene: Total  assistance       Functional mobility during ADLs: Maximal assistance;+2 for physical assistance;+2 for safety/equipment General ADL Comments: Sit<>Stand with stedy to transfer to recliner. performing grooming in  recliner. very fatigued.    Extremity/Trunk Assessment Upper Extremity Assessment Upper Extremity Assessment: Generalized weakness RUE Deficits / Details: hypersensitive   Lower Extremity Assessment Lower Extremity Assessment: Defer to PT evaluation RLE Deficits / Details: hypersensitive to touch, partial ROM against gravity LLE Deficits / Details: non-tunneled HD cath in groin, partial ROM against gravity        Vision       Perception     Praxis      Cognition Arousal/Alertness: Awake/alert Behavior During Therapy: WFL for tasks assessed/performed Overall Cognitive Status: Impaired/Different from baseline Area of Impairment: Attention, Following commands, Safety/judgement, Awareness, Problem solving                   Current Attention Level: Selective   Following Commands: Follows one step commands with increased time Safety/Judgement: Decreased awareness of deficits Awareness: Emergent Problem Solving: Slow processing, Decreased initiation, Requires verbal cues General Comments: Pt very fatigued and with decreased activity tolerance impacting her cognition. Requiring increased time and cues. Noting pt verbalized feeling overwhelmed by functional change.        Exercises      Shoulder Instructions       General Comments VSS on RA.    Pertinent Vitals/ Pain       Pain Assessment Pain Assessment: Faces Faces Pain Scale: Hurts a little bit Pain Location: RLE with movement Pain Descriptors / Indicators: Grimacing, Guarding, Sore Pain Intervention(s): Monitored during session, Limited activity within patient's tolerance, Repositioned  Home Living                                          Prior Functioning/Environment              Frequency  Min 2X/week        Progress Toward Goals  OT Goals(current goals can now be found in the care plan section)  Progress towards OT goals: Progressing toward goals (However, presenting  with more fatigue)  Acute Rehab OT Goals OT Goal Formulation: With patient/family Time For Goal Achievement: 11/26/21 Potential to Achieve Goals: Good ADL Goals Pt Will Perform Grooming: with set-up;with supervision;sitting Pt Will Perform Upper Body Dressing: with set-up;with supervision;sitting Pt Will Transfer to Toilet: with mod assist;stand pivot transfer;bedside commode Additional ADL Goal #1: Pt will tolerate sitting at EOB with Min Guard A in preparation for ADLs Additional ADL Goal #2: Pt will demonstrate increased activity tolerance to perform four grooming tasks in sitting with Supervision  Plan Discharge plan remains appropriate    Co-evaluation    PT/OT/SLP Co-Evaluation/Treatment: Yes Reason for Co-Treatment: For patient/therapist safety;To address functional/ADL transfers   OT goals addressed during session: ADL's and self-care      AM-PAC OT "6 Clicks" Daily Activity     Outcome Measure   Help from another person eating meals?: A Little Help from another person taking care of personal grooming?: A Little Help from another person toileting, which includes using toliet, bedpan, or urinal?: A Lot Help from another person bathing (including washing, rinsing, drying)?: A Lot Help from another person to put on and taking off regular upper body clothing?: A Lot Help from another person to put on and taking off regular lower  body clothing?: Total 6 Click Score: 13    End of Session    OT Visit Diagnosis: Unsteadiness on feet (R26.81);Other abnormalities of gait and mobility (R26.89);Muscle weakness (generalized) (M62.81)   Activity Tolerance Patient tolerated treatment well (but did not want to try standing)   Patient Left in chair;with call bell/phone within reach;with bed alarm set   Nurse Communication Mobility status (lift pad in place)        Time: 9983-3825 OT Time Calculation (min): 31 min  Charges: OT General Charges $OT Visit: 1 Visit OT  Treatments $Self Care/Home Management : 8-22 mins  [place], OTR/L Acute Rehab Pager: [phone] Office: [place] 11/19/2021, 9:46 AM
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
## 2021-11-19 NOTE — Progress Notes (Signed)
ANTICOAGULATION CONSULT NOTE  Pharmacy Consult for bivalirudin Indication: pulmonary embolus  Allergies  Allergen Reactions   Aspirin Nausea And Vomiting    Other reaction(s): Unknown   Benadryl [Diphenhydramine]     Can only take dye free. States the dye causes itching.   Darvon [Propoxyphene] Itching    Can only during the day. Night time makes her itch   Hydrocodone Bit-Homatrop Mbr Itching   Metformin     Other reaction(s): GI   Other     Other reaction(s): Unknown. States reaction was to nasal spray that caused pupils to shrink   Pentazocine     nervous Other reaction(s): jitters   Percocet [Oxycodone-Acetaminophen]     itching   Sulfa Antibiotics Hives   Tetracaine Hcl     Other reaction(s): Unknown. Thinks caused itching.   Tetracyclines & Related Hives   Tramadol     Other reaction(s): itch    Patient Measurements: Height: 5\' 5"  (165.1 cm) Weight: 71.9 kg (158 lb 8.2 oz) IBW/kg (Calculated) : 57  Vital Signs: Temp: 97.9 F (36.6 C) (01/23 0802) Temp Source: Oral (01/23 0802) BP: 143/73 (01/23 0500) Pulse Rate: 81 (01/23 0500)  Labs: Recent Labs    11/17/21 0515 11/17/21 0516 11/17/21 1710 11/18/21 0604 11/18/21 1850 11/19/21 0516  HGB 8.4*  --   --  8.2*  --  8.4*  HCT 25.5*  --   --  24.4*  --  25.6*  PLT 16*  --   --  19*  --  29*  APTT 76*  --    < > 80* 67* 77*  CREATININE  --  5.22*  --  4.32*  --  5.94*   < > = values in this interval not displayed.     Estimated Creatinine Clearance: 8.4 mL/min (A) (by C-G formula based on SCr of 5.94 mg/dL (H)).   Assessment: 71 yo female with PE s/p thrombectomy, low pltc nadir < 5, negative HIT, on bivalirudin.   aPTT remains therapeutic at 77, on bivalirudin drip 0.09 mg/kg. No overt bleeding, hgb stable 8s and pltc improved to 29 LFTS improving  Peak AST 2200>58 ALT 700>72 Tbili 2.7>1.5   Goal of Therapy:  PTT goal 50-85 sec Monitor platelets by anticoagulation protocol: Yes   Plan:   Continue bivalirudin at 0.09 mg/kg/hr Will monitor aPTT every 12 hours     Bonnita Nasuti Pharm.D. CPP, BCPS Clinical Pharmacist [phone] 11/19/2021 8:08 AM   **Pharmacist phone directory can now be found on [email or web address] ([email or web address]).  Listed under [place].

## 2021-11-19 NOTE — TOC Progression Note (Signed)
Transition of Care [facility]) - Progression Note    Patient Details  Name: [name] MRN: [id number] Date of Birth: [birth year]  [facility] CM/SW Contact  Graves-Bigelow, Ocie Cornfield, RN Phone Number: 11/19/2021, 3:25 PM  Clinical Narrative:   Case Manager continues to follow the patient for disposition needs. Physical Therapy worked with the patient today with recommendations are for CIR. Case Manager did speak with the daughter and she had some concerns regarding the patient's care. Case Manager relayed the information to the unit director. Case Manager will continue to follow the patient as she progresses.      Expected Discharge Plan: IP Rehab Facility Barriers to Discharge: Continued Medical Work up  Expected Discharge Plan and Services Expected Discharge Plan: [place] In-house Referral: Clinical Social Work Discharge Planning Services: CM Consult Post Acute Care Choice: [place] arrangements for the past 2 months: Apartment                  Readmission Risk Interventions Readmission Risk Prevention Plan 11/02/2021  Transportation Screening Complete  Some recent data might be hidden

## 2021-11-19 NOTE — Progress Notes (Signed)
Daughter updated at bedside.  Julian Hy, DO 11/19/21 10:46 AM [facility]

## 2021-11-19 NOTE — Progress Notes (Addendum)
ANTICOAGULATION CONSULT NOTE  Pharmacy Consult for bivalirudin Indication: pulmonary embolus  Allergies  Allergen Reactions   Aspirin Nausea And Vomiting    Other reaction(s): Unknown   Benadryl [Diphenhydramine]     Can only take dye free. States the dye causes itching.   Darvon [Propoxyphene] Itching    Can only during the day. Night time makes her itch   Hydrocodone Bit-Homatrop Mbr Itching   Metformin     Other reaction(s): GI   Other     Other reaction(s): Unknown. States reaction was to nasal spray that caused pupils to shrink   Pentazocine     nervous Other reaction(s): jitters   Percocet [Oxycodone-Acetaminophen]     itching   Sulfa Antibiotics Hives   Tetracaine Hcl     Other reaction(s): Unknown. Thinks caused itching.   Tetracyclines & Related Hives   Tramadol     Other reaction(s): itch    Patient Measurements: Height: 5\' 5"  (165.1 cm) Weight: 75.7 kg (166 lb 14.2 oz) IBW/kg (Calculated) : 57  Vital Signs: Temp: 98.9 F (37.2 C) (01/23 1630) Temp Source: Oral (01/23 1630) BP: 113/57 (01/23 1700) Pulse Rate: 84 (01/23 1700)  Labs: Recent Labs    11/17/21 0515 11/17/21 0516 11/17/21 1710 11/18/21 0604 11/18/21 1850 11/19/21 0516 11/19/21 1703  HGB 8.4*  --   --  8.2*  --  8.4*  --   HCT 25.5*  --   --  24.4*  --  25.6*  --   PLT 16*  --   --  19*  --  29*  --   APTT 76*  --    < > 80* 67* 77* 58*  CREATININE  --  5.22*  --  4.32*  --  5.94*  --    < > = values in this interval not displayed.     Estimated Creatinine Clearance: 8.6 mL/min (A) (by C-G formula based on SCr of 5.94 mg/dL (H)).   Assessment: 71 yo female with PE s/p thrombectomy, low pltc nadir < 5, negative HIT, on bivalirudin.  aPTT remains therapeutic at 58 on bivalirudin drip 0.09 mg/kg/hr. No overt bleeding reported, hgb stable 8s and pltc improved to 29 LFTS improved (last labs 1/20) Peak AST 2200>58 ALT 700>72 Tbili 2.7>1.5 Noted AKI in setting of shock, CRRT >> IHD  (last session today)   Goal of Therapy:  PTT goal 50-85 sec Monitor platelets by anticoagulation protocol: Yes   Plan:  Continue bivalirudin at 0.09 mg/kg/hr Monitor aPTT every 12 hours, daily CBC, s/sx bleeding   Arturo Morton, PharmD, BCPS Please check AMION for all [place] contact numbers Clinical Pharmacist 11/19/2021 6:15 PM

## 2021-11-19 NOTE — Consult Note (Signed)
Chief Complaint: Ongoing dialysis access. Request is for tunneled HD catheter placement  Referring Physician(s): Dr. Shirl Harris  Supervising Physician: Corrie Mckusick  Patient Status: [facility] - In-pt  History of Present Illness: MARIADEJESUS Hahn is a 71 y.o. female 71 y.o. female inpatient. History DM, HTN, HLD, GERD. Admitted for massive PE s/p mechanical thrombectomy, prolonged cardiac arrest.Now in renal failure with suspected DIC. Patient has a left femoral temp cath placed by ICU on 1.4.263 and a RIJ central line, Team is requesting a tunneled HD catheter for ongoing dialysis access.   Daughter at bedside. Patient alert  sitting in recliner calm and comfortable. Denies any fevers, headache, chest pain, SOB, cough, abdominal pain, nausea, vomiting or bleeding. States she is "tired". Return precautions and treatment recommendations and follow-up discussed with the patient and her daughter who are agreeable with the plan.   Past Medical History:  Diagnosis Date   Anxiety    Arthritis    Asthma    Cardiac arrest ([place]) 10/28/2021   Cardiac arrest ([place]) 10/28/2021   Depression    Diabetes mellitus without complication (HCC)    Fibromyalgia    GERD (gastroesophageal reflux disease)    Hyperlipemia    Hypertension    PONV (postoperative nausea and vomiting)     Past Surgical History:  Procedure Laterality Date   COLONOSCOPY     DILATION AND CURETTAGE OF UTERUS     IR EMBO ART  VEN HEMORR LYMPH EXTRAV  INC GUIDE ROADMAPPING  11/01/2021   IR THROMBECT PRIM MECH INIT (INCLU) MOD SED  10/29/2021   IR US GUIDE VASC ACCESS RIGHT  10/29/2021   IR US GUIDE VASC ACCESS RIGHT  11/01/2021   KNEE ARTHROSCOPY  2003,2005   left   SHOULDER ARTHROSCOPY     left   TRIGGER FINGER RELEASE  08/13/2012   Procedure: RELEASE TRIGGER FINGER/A-1 PULLEY;  Surgeon: [name]., MD;  Location: [place];  Service: Orthopedics;  Laterality: Right;  long finger   TUBAL LIGATION       Allergies: Aspirin, Benadryl [diphenhydramine], Darvon [propoxyphene], Hydrocodone bit-homatrop mbr, Metformin, Other, Pentazocine, Percocet [oxycodone-acetaminophen], Sulfa antibiotics, Tetracaine hcl, Tetracyclines & related, and Tramadol  Medications: Prior to Admission medications   Medication Sig Start Date End Date Taking? Authorizing Provider  acetaminophen (TYLENOL) 500 MG tablet Take 500-1,000 mg by mouth every 6 (six) hours as needed for mild pain or headache.    [provider]  albuterol (ACCUNEB) 1.25 MG/3ML nebulizer solution Take 1.25 mg by nebulization every 8 (eight) hours as needed for wheezing or shortness of breath. 07/04/21   [provider]  albuterol (PROVENTIL HFA;VENTOLIN HFA) 108 (90 BASE) MCG/ACT inhaler Inhale 1-2 puffs into the lungs every 4 (four) hours as needed for wheezing or shortness of breath.     [provider]  ALPRAZolam Duanne Moron) 0.25 MG tablet Take 0.25 mg by mouth daily as needed for anxiety. 02/19/19   [provider]  baclofen (LIORESAL) 10 MG tablet TAKE 1 TABLET(10 MG) BY MOUTH TWICE DAILY AS NEEDED FOR MUSCLE SPASMS Patient taking differently: Take 10 mg by mouth 2 (two) times daily as needed for muscle spasms. 06/15/21   Bo Merino, MD  betamethasone dipropionate (DIPROLENE) 0.05 % ointment 1 application to affected area 03/15/15   [provider]  diclofenac Sodium (VOLTAREN) 1 % GEL Apply 4 g topically daily as needed (pain).    [provider]  diphenhydrAMINE HCl (BENADRYL ALLERGY PO)  [provider]  Elastic Bandages & Supports (MEDICAL COMPRESSION STOCKINGS) [place] 1 each by Does not apply route every 6 (six) hours as needed (swelling). 04/28/19   Wieters, Hallie C, PA-C  fluticasone (FLONASE) 50 MCG/ACT nasal spray Place 2 sprays into both nostrils daily. 08/07/10   [provider]  gabapentin (NEURONTIN) 300 MG capsule Take 300 mg by mouth 2 (two) times daily.     [provider]  hydrochlorothiazide (HYDRODIURIL) 12.5 MG tablet Take 12.5 mg by mouth daily. 02/10/19   [provider]  LANTUS SOLOSTAR 100 UNIT/ML Solostar Pen Inject 22 Units into the skin at bedtime. 12/09/18   [provider]  pantoprazole (PROTONIX) 40 MG tablet Take 40 mg by mouth 2 (two) times daily.    [provider]  repaglinide (PRANDIN) 2 MG tablet Take 2 mg by mouth 2 (two) times daily before a meal.    [provider]  simvastatin (ZOCOR) 40 MG tablet Take 40 mg by mouth daily.    [provider]  temazepam (RESTORIL) 15 MG capsule TAKE 1 CAPSULE(15 MG) BY MOUTH AT BEDTIME AS NEEDED Patient taking differently: Take 15 mg by mouth at bedtime as needed for sleep. 10/15/21   Ofilia Neas, PA-C  Vitamin D, Ergocalciferol, (DRISDOL) 1.25 MG (50000 UNIT) CAPS capsule Take 50,000 Units by mouth once a week. 10/08/21   [provider]     Family History  Problem Relation Age of Onset   Diabetes Mother    Heart attack Father    Diabetes Sister    Throat cancer Brother    Hypertension Daughter     Social History   Socioeconomic History   Marital status: Divorced    Spouse name: Not on file   Number of children: Not on file   Years of education: Not on file   Highest education level: Not on file  Occupational History   Not on file  Tobacco Use   Smoking status: Former    Packs/day: 0.50    Years: 20.00    Pack years: 10.00    Types: Cigarettes    Quit date: 08/10/2010    Years since quitting: 11.2   Smokeless tobacco: Never  Vaping Use   Vaping Use: Never used  Substance and Sexual Activity   Alcohol use: No   Drug use: No   Sexual activity: Not on file  Other Topics Concern   Not on file  Social History Narrative   Not on file   Social Determinants of Health   Financial Resource Strain: Not on file  Food Insecurity: Not on file  Transportation Needs: Not on file  Physical Activity: Not on file   Stress: Not on file  Social Connections: Not on file    Review of Systems: A 12 point ROS discussed and pertinent positives are indicated in the HPI above.  All other systems are negative.  Review of Systems  Constitutional:  Positive for fatigue. Negative for fever.  HENT:  Negative for congestion.   Respiratory:  Negative for cough and shortness of breath.   Gastrointestinal:  Negative for abdominal pain, diarrhea, nausea and vomiting.   Vital Signs: BP (!) 157/74 (BP Location: Right Arm)    Pulse 81    Temp 97.9 F (36.6 C) (Oral)    Resp 14    Ht 5\' 5"  (1.651 m)    Wt 158 lb 8.2 oz (71.9 kg)    SpO2 94%    BMI 26.38 kg/m  Physical Exam Vitals and nursing note reviewed.  Constitutional:      Appearance: She is well-developed.  HENT:     Head: Normocephalic and atraumatic.  Eyes:     Conjunctiva/sclera: Conjunctivae normal.  Cardiovascular:     Rate and Rhythm: Normal rate and regular rhythm.     Heart sounds: Normal heart sounds.  Pulmonary:     Effort: Pulmonary effort is normal.     Breath sounds: Normal breath sounds.  Abdominal:     Comments: NG noted to left nare  Musculoskeletal:        General: Normal range of motion.     Cervical back: Normal range of motion.  Skin:    General: Skin is warm.  Neurological:     Mental Status: She is alert and oriented to person, place, and time.    Imaging: CT ABDOMEN PELVIS WO CONTRAST  Result Date: 10/30/2021 CLINICAL DATA:  Retroperitoneal hemorrhage. EXAM: CT ABDOMEN AND PELVIS WITHOUT CONTRAST TECHNIQUE: Multidetector CT imaging of the abdomen and pelvis was performed following the standard protocol without IV contrast. COMPARISON:  October 28, 2021. FINDINGS: Lower chest: Large bilateral lower lobe airspace opacities are noted concerning for pneumonia with small adjacent pleural effusions. Hepatobiliary: Contrast is noted in the gallbladder. No biliary dilatation is noted. There appears to be hemorrhage within the left  hepatic lobe which extends into large adjacent left subscapular hematoma or perihepatic hematoma. High-density fluid is noted around the liver concerning for hemorrhage. Pancreas: Unremarkable. No pancreatic ductal dilatation or surrounding inflammatory changes. Spleen: Normal in size without focal abnormality. Adrenals/Urinary Tract: Adrenal glands and kidneys are unremarkable. No hydronephrosis or renal obstruction is noted. Urinary bladder is decompressed secondary to Foley catheter. Stomach/Bowel: Nasogastric tube is seen looped within proximal stomach. The appendix appears normal. There is no evidence of bowel obstruction or inflammation. Vascular/Lymphatic: Aortic atherosclerosis. No enlarged abdominal or pelvic lymph nodes. Reproductive: Uterus and bilateral adnexa are unremarkable. Other: There is interval development of a large amount of high density fluid in the pelvis consistent with intraperitoneal hemorrhage. It is seen in bilateral pericolic gutters and noted around the spleen and pancreas. Musculoskeletal: No acute or significant osseous findings. IMPRESSION: There is interval development of large intraperitoneal hemorrhage seen in the pelvis which extends into both pericolic gutters and around the liver and spleen. Also noted is interval development intraparenchymal hemorrhage within the left hepatic lobe which extends into large left subcapsular hematoma. Large bilateral lower lobe airspace opacities are noted most consistent with pneumonia with small adjacent pleural effusions. Critical Value/emergent results were called by telephone at the time of interpretation on 10/30/2021 at 9:20 pm to provider Dr. Lucile Shutters, who verbally acknowledged these results. Aortic Atherosclerosis (ICD10-I70.0). Electronically Signed   By: Marijo Conception M.D.   On: 10/30/2021 21:21   DG Chest 1 View  Result Date: 11/01/2021 CLINICAL DATA:  CPR.  [employer]. EXAM: CHEST  1 VIEW COMPARISON:  10/29/2021 FINDINGS: 0740 hours.  Endotracheal tube tip is approximately 2.7 cm above the base of the carina. NG tube tip overlies the gastric fundus. Right IJ central line tip is positioned in the low right atrium. Low lung volumes with bibasilar atelectasis and left-sided pleural effusion. Telemetry leads overlie the chest. IMPRESSION: 1. Endotracheal tube tip is approximately 2.7 cm above the base of the carina. 2. Low volume film with bibasilar atelectasis and left pleural effusion. Electronically Signed   By: Misty Stanley M.D.   On: 11/01/2021 07:55   CT HEAD WO CONTRAST (  5MM)  Result Date: 11/08/2021 CLINICAL DATA:  Delirium. EXAM: CT HEAD WITHOUT CONTRAST TECHNIQUE: Contiguous axial images were obtained from the base of the skull through the vertex without intravenous contrast. RADIATION DOSE REDUCTION: This exam was performed according to the departmental dose-optimization program which includes automated exposure control, adjustment of the mA and/or kV according to patient size and/or use of iterative reconstruction technique. COMPARISON:  Head CT 11/04/2021 FINDINGS: Brain: Mild motion artifact limitations. No intracranial hemorrhage, mass effect, or midline shift. No hydrocephalus. The basilar cisterns are patent. For with differentiation is preserved. No evidence of territorial infarct or acute ischemia. No extra-axial or intracranial fluid collection. Vascular: Atherosclerosis of skullbase vasculature without hyperdense vessel or abnormal calcification. Skull: No fracture or focal lesion. Sinuses/Orbits: Paranasal sinuses and mastoid air cells are clear. The visualized orbits are unremarkable. Other: None. IMPRESSION: No acute intracranial abnormality, allowing for motion artifact limitations. Electronically Signed   By: Keith Rake M.D.   On: 11/08/2021 17:59   CT HEAD WO CONTRAST (5MM)  Result Date: 11/04/2021 CLINICAL DATA:  Stroke, hemorrhagic Additional history: Patient is status post cardiac arrest. EXAM: CT HEAD  WITHOUT CONTRAST TECHNIQUE: Contiguous axial images were obtained from the base of the skull through the vertex without intravenous contrast. COMPARISON:  CT head 10/28/2021. FINDINGS: Motion limited study. Brain: No evidence of acute large vascular territory infarction, hemorrhage, hydrocephalus, extra-axial collection or mass lesion/mass effect. Vascular: No hyperdense vessel identified. Skull: No acute fracture. Sinuses/Orbits: Clear sinuses.  Unremarkable orbits. Other: No mastoid effusions IMPRESSION: No evidence of acute intracranial abnormality on this motion limited study. MRI could provide more sensitive evaluation for hypoxic/ischemic injury if clinically indicated. Electronically Signed   By: Margaretha Sheffield M.D.   On: 11/04/2021 14:40   CT HEAD WO CONTRAST (5MM)  Result Date: 10/28/2021 CLINICAL DATA:  Mental status change, unknown cause EXAM: CT HEAD WITHOUT CONTRAST TECHNIQUE: Contiguous axial images were obtained from the base of the skull through the vertex without intravenous contrast. COMPARISON:  None. FINDINGS: Brain: No evidence of large-territorial acute infarction. No parenchymal hemorrhage. No mass lesion. No extra-axial collection. No mass effect or midline shift. No hydrocephalus. Basilar cisterns are patent. Vascular: No hyperdense vessel. Skull: No acute fracture or focal lesion. Sinuses/Orbits: Mucosal thickening within bilateral nasal cavities. Paranasal sinuses and mastoid air cells are clear. The orbits are unremarkable. Other: None. IMPRESSION: No acute intracranial abnormality. Electronically Signed   By: Iven Finn M.D.   On: 10/28/2021 17:31   CT Angio Chest Pulmonary Embolism (PE) W or WO Contrast  Result Date: 10/28/2021 CLINICAL DATA:  Pulmonary embolism (PE) suspected, high prob echo with RV strain EXAM: CT ANGIOGRAPHY CHEST WITH CONTRAST TECHNIQUE: Multidetector CT imaging of the chest was performed using the standard protocol during bolus administration of  intravenous contrast. Multiplanar CT image reconstructions and MIPs were obtained to evaluate the vascular anatomy. CONTRAST:  48mL OMNIPAQUE IOHEXOL 350 MG/ML SOLN COMPARISON:  None. FINDINGS: Lines and tubes: Right mainstem bronchus intubation with tip terminating just distal to the carina. Enteric tube coursing below the hemidiaphragm with tip within the gastric lumen. There is a 5.2 cm density within the right lower neck that is partially visualized that may be related to the insertion site of a right internal jugular central venous catheter with tip terminating in the right atrium. Cardiovascular: Satisfactory opacification of the pulmonary arteries to the segmental level. Saddle pulmonary embolus extending to the segmental and subsegmental levels in the left lower lobe in all three right lobes.  Associated increased right to left ventricular ratio 1.9. No significant pericardial effusion. The thoracic aorta is normal in caliber. No atherosclerotic plaque of the thoracic aorta. No coronary artery calcifications. Mediastinum/Nodes: No definite pneumomediastinum. No enlarged mediastinal, hilar, or axillary lymph nodes. Thyroid gland, trachea, and esophagus demonstrate no significant findings. Lungs/Pleura: Bilateral lower lobe subsegmental atelectasis. Lingular subsegmental atelectasis. No pulmonary nodule. No pulmonary mass. No pleural effusion. No pneumothorax. Upper Abdomen: Query partially visualized right upper mid abdomen fat stranding. Musculoskeletal: No suspicious lytic or blastic osseous lesions. Acute minimally displaced left anterior fourth and fifth rib fractures. Acute nondisplaced mid sternal body fracture. Likely old nondisplaced left posterior rib fractures. Multilevel degenerative changes of the spine. Review of the MIP images confirms the above findings. IMPRESSION: 1. Saddle pulmonary embolus extending to the segmental and subsegmental levels in the left lower lobe and in all three right lobes.  Associated right heart strain. No definite pulmonary infarction. Positive for acute PE with CT evidence of right heart strain (RV/LV Ratio = 1.9) consistent with at least submassive (intermediate risk) PE. The presence of right heart strain has been associated with an increased risk of morbidity and mortality. Please refer to the "PE Focused" order set in EPIC. 2. Right mainstem bronchus intubation with tip terminating just distal to the carina. Recommend retracting by 3 cm. 3. A 5.2 cm density within the right lower neck that is partially visualized that may be related to a hematoma along the insertion site of a right internal jugular central venous catheter with tip terminating in the right atrium. Limited evaluation due to timing of contrast. 4. Acute minimally displaced left anterior 4th and 5th rib fractures. 5.  Acute nondisplaced mid sternal body fracture. 6. Query partially visualized right upper mid abdomen fat stranding. Concern for underlying mass or inflammation. Recommend CT abdomen pelvis with contrast for further evaluation. 7. Bilateral lower lobe and lingular consolidation may represent atelectasis with superimposed infection/inflammation (such as aspiration pneumonia) not excluded. These results were called by telephone at the time of interpretation on 10/28/2021 at 5:17 pm to provider Dr. Ernest Mallick , who verbally acknowledged these results. Electronically Signed   By: Iven Finn M.D.   On: 10/28/2021 17:30   CT ABDOMEN PELVIS W CONTRAST  Result Date: 10/28/2021 CLINICAL DATA:  Anemia. EXAM: CT ABDOMEN AND PELVIS WITH CONTRAST TECHNIQUE: Multidetector CT imaging of the abdomen and pelvis was performed using the standard protocol following bolus administration of intravenous contrast. CONTRAST:  11mL OMNIPAQUE IOHEXOL 300 MG/ML  SOLN COMPARISON:  CT scan of same day. FINDINGS: Lower chest: Left upper lobe atelectasis or pneumonia is noted. Mild posterior basilar subsegmental atelectasis is  noted. Pulmonary embolus is noted in right pulmonary artery as noted on prior CT scan of same day. Hepatobiliary: No gallstones or biliary dilatation is noted. Large ill-defined low density is noted in left hepatic lobe measuring 3.7 x 2.4 cm. Mild amount of fluid is noted in the gallbladder fossa and in Morison's pouch concerning for possible cholecystitis. Pancreas: Unremarkable. No pancreatic ductal dilatation or surrounding inflammatory changes. Spleen: Normal in size without focal abnormality. Adrenals/Urinary Tract: Adrenal glands and kidneys appear normal. Hydronephrosis or renal obstruction is noted. Urinary bladder is decompressed secondary to Foley catheter. Stomach/Bowel: Stomach and appendix are unremarkable. There is no evidence of bowel obstruction. Colon is unremarkable. Fluid-filled small bowel loops are noted with minimal wall and fold thickening and enhancement suggesting mild enteritis. Vascular/Lymphatic: Aortic atherosclerosis. No enlarged abdominal or pelvic lymph nodes. Reproductive: Uterus and  bilateral adnexa are unremarkable. Other: No abdominal wall hernia or abnormality. No abdominopelvic ascites. Musculoskeletal: No acute or significant osseous findings. IMPRESSION: Mild amount of fluid is noted in the gallbladder fossa and in Morrison's pouch which may represent cholecystitis. No cholelithiasis is noted. Ultrasound is recommended for further evaluation. Ill-defined 3.7 x 2.4 cm low density is noted in the left hepatic lobe of uncertain etiology. When the patient is clinically stable and able to follow directions and hold their breath (preferably as an outpatient) further evaluation with dedicated abdominal MRI should be considered. Fluid-filled small bowel loops are noted with minimal wall and fold thickening and enhancement suggesting enteritis. Aortic Atherosclerosis (ICD10-I70.0). Electronically Signed   By: Marijo Conception M.D.   On: 10/28/2021 18:46   MR LIVER WO  CONRTAST  Result Date: 11/09/2021 CLINICAL DATA:  71 year old female with history of intraperitoneal hemorrhage. Additional intraparenchymal hemorrhage noted within the liver. Evaluate for underlying hepatocellular carcinoma or other hepatic mass. EXAM: MRI ABDOMEN WITHOUT CONTRAST TECHNIQUE: Multiplanar multisequence MR imaging was performed without the administration of intravenous contrast. COMPARISON:  No prior abdominal MRI. CT of the abdomen and pelvis 10/31/2011 scratch the CT the abdomen and pelvis 10/30/2021. FINDINGS: Comment: Today's study is limited for detection and characterization of visceral and/or vascular lesions by lack of IV gadolinium. Additionally, extensive patient motion severely limits today's examination. Lower chest: High signal intensity in the lower thorax dependently, likely a combination of layering pleural effusions and extensive airspace consolidation in the dependent portions of the lungs, poorly evaluated by MRI. Allowing for differences between modalities, findings appear very similar to prior CT of the abdomen and pelvis 10/30/2021. Hepatobiliary: In the left lobe of the liver centered predominantly in segment 3 there is a expansile area within the parenchyma which is very heterogeneous in signal intensity on T1 and T2 weighted images, including some dependent areas of T1 hyperintensity. The remainder of the liver is otherwise normal in appearance, without additional suspicious cystic or solid hepatic lesions. No intra or extrahepatic biliary ductal dilatation. Gallbladder is moderately distended, also demonstrating heterogeneous signal intensity. Gallbladder wall appears mildly irregular, and there is some fluid adjacent to the gallbladder posteriorly, but no circumferential pericholecystic fluid to clearly indicate an acute cholecystitis at this time. Pancreas: No definite pancreatic mass or peripancreatic fluid collections or inflammatory changes noted on today's noncontrast  examination. Spleen:  Unremarkable. Adrenals/Urinary Tract: Unenhanced appearance of the kidneys and bilateral adrenal glands is normal. No hydroureteronephrosis in the visualized portions of the abdomen. Stomach/Bowel: Visualized portions are unremarkable. Vascular/Lymphatic: No aneurysm identified in the visualized abdominal vasculature. No lymphadenopathy noted in the abdomen. Other: Moderate volume of free intraperitoneal fluid, best visualized in the pericolic gutters (right greater than left). This is slightly T1 hyperintense and T2 iso to slightly hyperintense, compatible with indwelling blood products. Musculoskeletal: No aggressive appearing osseous lesions are noted in the visualized portions of the skeleton. IMPRESSION: 1. Findings appear very similar to the recent CT examination. Unfortunately, without IV gadolinium, the large intraparenchymal hepatic lesion centered predominantly in segment 3 of the liver cannot be characterized. This is once again favored to represent a large intrahepatic hemorrhage, although underlying mass lesion is difficult to exclude. If there is elevated AFP level or other clinical concern for hepatic malignancy, repeat abdominal MRI with and without IV gadolinium would be recommended, preferably after resolution of the patient's acute illness to allow for proper patient breath-holding, to better evaluate these findings and exclude underlying malignancy. 2. Distended gallbladder which demonstrates  heterogeneous signal intensity, likely in part related to vicarious excretion of iodinated contrast material, but also potentially related to the presence of blood products. Gallbladder is slightly more distended than prior CT examination. If there is clinical concern for cholecystitis, further evaluation with nuclear medicine hepatobiliary scan should be considered. 3. Intraperitoneal hemorrhage redemonstrated. 4. Severe bilateral lower lobe pneumonia and trace parapneumonic pleural  effusions. Given the distribution, this likely represent severe aspiration pneumonia. Electronically Signed   By: Vinnie Langton M.D.   On: 11/09/2021 05:33   MR LIVER W WO CONTRAST  Result Date: 11/16/2021 CLINICAL DATA:  Intrahepatic hematoma on recent CT and MR. Patient underwent left hepatic artery angiogram a Gel-Foam embolization on 11/01/2021. EXAM: MRI ABDOMEN WITHOUT AND WITH CONTRAST TECHNIQUE: Multiplanar multisequence MR imaging of the abdomen was performed both before and after the administration of intravenous contrast. CONTRAST:  61mL MULTIHANCE GADOBENATE DIMEGLUMINE 529 MG/ML IV SOLN COMPARISON:  MRI 11/08/2021.  CT scan 10/30/2021. FINDINGS: Note: Fine anatomic detail is obscured by motion artifact on today's study as is contrast resolution. Lower chest: Left lower lobe collapse/consolidation. Expansion in the posterior right lower lobe has improved in the interval. Hepatobiliary: As before, there is a large 9.5 x 10.5 x 6.9 cm heterogeneous lesion/collection in the inferior aspect of the lateral segment left liver. This lesion was measured at 8.5 x 10.0 x 7.0 cm previously. After IV contrast administration, there is some peripheral enhancement around the lesion. The fluid fluid level seen on the previous exam is less prominent today. Coronal imaging shows irregular thickened peripheral enhancement, most prominent along the superior margin (see coronal postcontrast image 91 of series 11). Gallbladder is markedly distended with multiple gallstones evident. Stones are noted in the neck of the gallbladder (coronal 15/3 and axial 27/4). No intrahepatic or extrahepatic biliary dilation. Pancreas: No focal mass lesion. No dilatation of the main duct. No intraparenchymal cyst. No peripancreatic edema. Fine detail of pancreatic anatomy obscured by motion artifact. Spleen: No gross abnormality in the spleen with fine detail obscured by motion artifact. Adrenals/Urinary Tract: No adrenal nodule or mass.  Postcontrast imaging of the kidneys is severely limited by motion artifact. Pre contrast T2 imaging shows areas of apparent segmental edema in both kidneys. No hydronephrosis. Stomach/Bowel: Stomach is unremarkable. No gastric wall thickening. No evidence of outlet obstruction. Duodenum is normally positioned as is the ligament of Treitz. No small bowel or colonic dilatation within the visualized abdomen. Vascular/Lymphatic: No abdominal aortic aneurysm. No abdominal lymphadenopathy. Other:  No intraperitoneal free fluid. Musculoskeletal: No focal suspicious marrow enhancement within the visualized bony anatomy. IMPRESSION: 1. Limited study due to motion artifact. As before, a large lesion in the inferior aspect of the lateral segment left liver is again noted, stable to minimally increased in the interval. The fluid fluid level seen on the previous study is less prominent today. Peripheral enhancement noted around the lesion with irregular enhancing margins most prominent superiorly. While the bulk of this lesion does not enhance centrally, consistent with hematoma, given the irregular peripheral enhancement along the cranial margin, hemorrhagic neoplasm cannot be excluded. 2. Markedly distended gallbladder with gallstones. No evidence for choledocholithiasis. No intrahepatic or extrahepatic biliary dilation. 3. Areas of segmental edema in both kidneys. Pyelonephritis not excluded. 4. Left greater than right lower lobe collapse/consolidation. 5. Interval decrease in intraperitoneal free fluid Electronically Signed   By: Misty Stanley M.D.   On: 11/16/2021 10:27   IR THROMBECT PRIM MECH INIT (INCLU) MOD SED  Result Date: 10/29/2021 INDICATION:  High-risk pulmonary embolism with hemodynamic instability requiring pressor support, bilateral pulmonary embolism with saddle component EXAM: 1. Ultrasound-guided puncture of the right common femoral vein 2. Catheterization of the main and right pulmonary arteries with  bilateral pulmonary artery angiogram 3. Mechanical/aspiration thrombectomy of the right and left pulmonary arteries COMPARISON:  CTA chest previous day MEDICATIONS: None. ANESTHESIA/SEDATION: Sedation provided by the critical care team. Refer to separate critical care records. FLUOROSCOPY TIME:  FLUOROSCOPY TIME Fluoroscopy Time: 12.3 minutes with 4 exposures COMPLICATIONS: None immediate. TECHNIQUE: Informed written consent was obtained from the patient after a thorough discussion of the procedural risks, benefits and alternatives. All questions were addressed. Maximal Sterile Barrier Technique was utilized including caps, mask, sterile gowns, sterile gloves, sterile drape, hand hygiene and skin antiseptic. A timeout was performed prior to the initiation of the procedure. The patient was placed supine on the exam table. Ultrasound of the right groin was performed. This demonstrated a widely patent right common femoral vein. A permanent image was stored in the electronic medical record. Using ultrasound guidance, the right common femoral vein was directly punctured with a 21 gauge micropuncture set with visualization of needle entry into the vessel lumen. Wire was advanced centrally into the IVC, followed by serial tract dilation to accommodate a short 6 [place] sheath. Initial attempts to catheterize the main pulmonary artery using combination of an Omni Flush catheter and Rosen guidewire were unsuccessful. The sheath was exchanged for a long 7 [place] sheath, and the pulmonary artery was catheterized using a combination of a single angled pigtail catheter and Rosen guidewire. Wire and catheter were directed into the right main pulmonary artery. Wire was exchanged for a short tapered stiff Amplatz wire. Over this wire, the access site was serially dilated to accommodate a 24 [place] sheath. Next, the Inari 24 [place] FlowTriever aspiration thrombectomy catheter was then advanced into the distal aspect of the right main  pulmonary artery. Aspiration thrombectomy of the right main pulmonary artery was performed. Large volume of acute appearing clot was removed. The aspiration thrombectomy catheter was slowly withdrawn into the main pulmonary artery/RVOT, and thrombectomy of the saddle component and left pulmonary artery were performed, with removal of additional acute appearing thrombus. With the aspiration thrombectomy catheter positioned within the main pulmonary artery, bilateral pulmonary artery angiogram was performed. Bilateral pulmonary artery angiogram demonstrates complete resolution of the saddle pulmonary embolism in clot burden within the main pulmonary arteries. There remains scattered residual small volume subsegmental embolism. The patient's clinical parameters had improved with reduction in vasopressor requirement and improvement in blood pressure at the end of the procedure. Given this improvement and no further large volume embolism identified on completion angiography, the procedure was stopped at this point. All catheters and wires were removed. Hemostasis was achieved at the access site using a pursestring suture. A clean dressing was placed. The patient tolerated the procedure well without immediate complication. FINDINGS: As above. IMPRESSION: Successful mechanical/aspiration thrombectomy of high-risk bilateral pulmonary embolism using the Inari FlowTriever aspiration thrombectomy catheter. Completion angiography demonstrated near-complete resolution of the saddle component and clot burden within the main pulmonary arteries. Electronically Signed   By: Albin Felling M.D.   On: 10/29/2021 16:33   IR US Guide Vasc Access Right  Result Date: 11/01/2021 INDICATION: 71 year old woman with acute anemia, hypotension, tachycardia due to large volume intraperitoneal hemorrhage originating from left liver lobe lesion presents to IR for angiogram and embolization. EXAM: 1. Ultrasound-guided access of right common  femoral artery 2. Celiac angiogram 3. Common  hepatic angiogram 4. Left hepatic angiogram and Gel-Foam embolization 5. Angio-Seal closure of right common femoral artery access MEDICATIONS: None ANESTHESIA/SEDATION: None CONTRAST:  42 mL Intra-arterial Omnipaque 300 FLUOROSCOPY TIME:  Fluoroscopy Time: 7 minutes 42 seconds (466 mGy). COMPLICATIONS: None immediate. PROCEDURE: Informed consent was obtained from the patient's daughter's following explanation of the procedure, risks, benefits and alternatives. The patient's family understands, agrees and consents for the procedure. All questions were addressed. A time out was performed prior to the initiation of the procedure. Maximal barrier sterile technique utilized including caps, mask, sterile gowns, sterile gloves, large sterile drape, hand hygiene, and chlorhexidine prep. Patient positioned supine on the angiography table. The right groin skin prepped and draped in usual fashion. Ultrasound image documenting patency of the right common femoral artery was obtained and placed in permanent medical record. Sterile ultrasound probe cover and gel utilized throughout the procedure. Using continuous ultrasound guidance, a 21 gauge needle was used to access the right common femoral artery at the level of the femoral head. 21 gauge needle exchanged for a transitional dilator set over 0.018 inch guidewire. Transitional dilator set exchanged for 5 French sheath over 0.035 inch guidewire. Celiac artery accessed with sauce omni catheter. Celiac angiogram showed patent left gastric, splenic, and common hepatic arteries. Microcatheter advanced into the common hepatic artery and angiogram was performed. The gastro duodenal and right hepatic arteries are patent. There is acute cut off of the left hepatic artery likely due to spasm related to hemorrhage in the left hepatic lobe. Microcatheter was advanced into the left hepatic artery an angiogram was performed which again showed the  acute cut off of the left hepatic artery. No active extravasation was identified. Given that the CT showed hemorrhage within the left liver lobe, embolization of the left hepatic artery was performed with Gel-Foam slurry until minimal antegrade flow was seen. Post embolization angiogram showed little flow into the left hepatic lobe and reflux into the right hepatic lobe artery. Sheath angiogram showed appropriate puncture of the common femoral artery at the level the femoral head. The groin was re-prepped and draped and closure was performed with 6 [place] Angio-Seal device. IMPRESSION: Successful embolization of the left hepatic artery with Gel-Foam slurry. Electronically Signed   By: Miachel Roux M.D.   On: 11/01/2021 14:52   IR US Guide Vasc Access Right  Result Date: 10/29/2021 INDICATION: High-risk pulmonary embolism with hemodynamic instability requiring pressor support, bilateral pulmonary embolism with saddle component EXAM: 1. Ultrasound-guided puncture of the right common femoral vein 2. Catheterization of the main and right pulmonary arteries with bilateral pulmonary artery angiogram 3. Mechanical/aspiration thrombectomy of the right and left pulmonary arteries COMPARISON:  CTA chest previous day MEDICATIONS: None. ANESTHESIA/SEDATION: Sedation provided by the critical care team. Refer to separate critical care records. FLUOROSCOPY TIME:  FLUOROSCOPY TIME Fluoroscopy Time: 12.3 minutes with 4 exposures COMPLICATIONS: None immediate. TECHNIQUE: Informed written consent was obtained from the patient after a thorough discussion of the procedural risks, benefits and alternatives. All questions were addressed. Maximal Sterile Barrier Technique was utilized including caps, mask, sterile gowns, sterile gloves, sterile drape, hand hygiene and skin antiseptic. A timeout was performed prior to the initiation of the procedure. The patient was placed supine on the exam table. Ultrasound of the right groin was  performed. This demonstrated a widely patent right common femoral vein. A permanent image was stored in the electronic medical record. Using ultrasound guidance, the right common femoral vein was directly punctured with a 21 gauge micropuncture  set with visualization of needle entry into the vessel lumen. Wire was advanced centrally into the IVC, followed by serial tract dilation to accommodate a short 6 [place] sheath. Initial attempts to catheterize the main pulmonary artery using combination of an Omni Flush catheter and Rosen guidewire were unsuccessful. The sheath was exchanged for a long 7 [place] sheath, and the pulmonary artery was catheterized using a combination of a single angled pigtail catheter and Rosen guidewire. Wire and catheter were directed into the right main pulmonary artery. Wire was exchanged for a short tapered stiff Amplatz wire. Over this wire, the access site was serially dilated to accommodate a 24 [place] sheath. Next, the Inari 24 [place] FlowTriever aspiration thrombectomy catheter was then advanced into the distal aspect of the right main pulmonary artery. Aspiration thrombectomy of the right main pulmonary artery was performed. Large volume of acute appearing clot was removed. The aspiration thrombectomy catheter was slowly withdrawn into the main pulmonary artery/RVOT, and thrombectomy of the saddle component and left pulmonary artery were performed, with removal of additional acute appearing thrombus. With the aspiration thrombectomy catheter positioned within the main pulmonary artery, bilateral pulmonary artery angiogram was performed. Bilateral pulmonary artery angiogram demonstrates complete resolution of the saddle pulmonary embolism in clot burden within the main pulmonary arteries. There remains scattered residual small volume subsegmental embolism. The patient's clinical parameters had improved with reduction in vasopressor requirement and improvement in blood pressure at the  end of the procedure. Given this improvement and no further large volume embolism identified on completion angiography, the procedure was stopped at this point. All catheters and wires were removed. Hemostasis was achieved at the access site using a pursestring suture. A clean dressing was placed. The patient tolerated the procedure well without immediate complication. FINDINGS: As above. IMPRESSION: Successful mechanical/aspiration thrombectomy of high-risk bilateral pulmonary embolism using the Inari FlowTriever aspiration thrombectomy catheter. Completion angiography demonstrated near-complete resolution of the saddle component and clot burden within the main pulmonary arteries. Electronically Signed   By: Albin Felling M.D.   On: 10/29/2021 16:33   DG Chest Port 1 View  Result Date: 11/14/2021 CLINICAL DATA:  Pneumonia, history of cardiac arrest, weakness EXAM: PORTABLE CHEST 1 VIEW COMPARISON:  11/12/2021 FINDINGS: The heart size and mediastinal contours are within normal limits. Right neck vascular catheter, tip projecting over the right atrium. Unchanged elevation of the right hemidiaphragm with associated scarring or atelectasis. The visualized skeletal structures are unremarkable. IMPRESSION: Unchanged elevation of the right hemidiaphragm with associated scarring or atelectasis. No new airspace opacity. Electronically Signed   By: Delanna Ahmadi M.D.   On: 11/14/2021 09:15   DG Chest Port 1 View  Result Date: 11/12/2021 CLINICAL DATA:  Cardiac arrest, acute respiratory failure. EXAM: PORTABLE CHEST 1 VIEW COMPARISON:  11/10/2021 and CT chest 10/28/2021. FINDINGS: Patient is rotated. Trachea is midline. Right IJ central line tip is in the right atrium. Heart size is stable. Lungs are low in volume with left lower lobe airspace opacification. Small left pleural effusion. Right hemidiaphragm is elevated. IMPRESSION: Persistent left lower lobe airspace opacification, possibly due to pneumonia.  Associated small left pleural effusion. Electronically Signed   By: Lorin Picket M.D.   On: 11/12/2021 08:47   DG CHEST PORT 1 VIEW  Result Date: 11/10/2021 CLINICAL DATA:  Hypoxia EXAM: PORTABLE CHEST 1 VIEW COMPARISON:  11/01/2021 FINDINGS: Single frontal view of the chest demonstrates interval removal of the endotracheal and enteric catheters. Right internal jugular catheter unchanged. Cardiac silhouette is stable. Chronic  elevation of the right hemidiaphragm. Improved aeration at the lung bases, with minimal residual atelectasis. Mild central vascular congestion. No effusion or pneumothorax. IMPRESSION: 1. Low lung volumes, with improved aeration at the lung bases. 2. Central vascular congestion without overt edema. Electronically Signed   By: Randa Ngo M.D.   On: 11/10/2021 22:09   DG Chest Port 1 View  Result Date: 10/29/2021 CLINICAL DATA:  Respiratory failure. EXAM: PORTABLE CHEST 1 VIEW COMPARISON:  10/28/2021. FINDINGS: ET tube tip is above the carina. Nasogastric tube tip and side port are in the gastric fundus. Right IJ catheter tip is in the right atrium. Stable cardiomediastinal contours. Asymmetric opacification within the left midlung and left lower lung appears increased from the previous exam. Right lung remains clear. IMPRESSION: 1. Worsening aeration to the left midlung and left lower lung. 2. Stable support apparatus. Electronically Signed   By: Kerby Moors M.D.   On: 10/29/2021 07:07   DG CHEST PORT 1 VIEW  Result Date: 10/28/2021 CLINICAL DATA:  Post CPR.  Central line placement. EXAM: PORTABLE CHEST 1 VIEW COMPARISON:  10/28/2021 at 1:15 p.m. FINDINGS: Right internal jugular central venous line has its tip in the right atrium. No pneumothorax. No other change from the earlier study. IMPRESSION: 1. Right IJ central venous line has its tip in the right atrium. No pneumothorax. Electronically Signed   By: Lajean Manes M.D.   On: 10/28/2021 14:52   DG Chest Portable 1  View  Result Date: 10/28/2021 CLINICAL DATA:  Status post cardiac arrest and intubation. EXAM: PORTABLE CHEST 1 VIEW COMPARISON:  10/23/2020. FINDINGS: Endotracheal tube tip projects 1 cm above the carinal. Nasal/orogastric tube passes well below the diaphragm into the stomach. Cardiac silhouette normal in size. Normal mediastinal and hilar contours. Clear lungs. IMPRESSION: 1. Well-positioned endotracheal tube and nasal/orogastric tube. 2. No acute cardiopulmonary disease. Electronically Signed   By: Lajean Manes M.D.   On: 10/28/2021 13:39   DG Abd Portable 1V  Result Date: 11/16/2021 CLINICAL DATA:  Feeding tube placement. EXAM: PORTABLE ABDOMEN - 1 VIEW COMPARISON:  Abdominal x-ray 10/28/2021. FINDINGS: Enteric tube tip is at the level of the distal stomach. No dilated bowel loops are seen. Hyperdensity is seen within diverticula in the right colon. IMPRESSION: 1. Enteric tube tip at the level of the distal stomach. Electronically Signed   By: Ronney Asters M.D.   On: 11/16/2021 16:30   DG Abd Portable 1 View  Result Date: 10/28/2021 CLINICAL DATA:  Nasogastric tube placement. EXAM: PORTABLE ABDOMEN - 1 VIEW COMPARISON:  None. FINDINGS: Nasogastric tube extends well below diaphragm to curl within the to stomach in the left upper quadrant. IMPRESSION: Well-positioned nasogastric tube. Electronically Signed   By: Lajean Manes M.D.   On: 10/28/2021 13:39   DG Swallowing Func-Speech Pathology  Result Date: 11/14/2021 Table formatting from the original result was not included. Objective Swallowing Evaluation: Type of Study: MBS-Modified Barium Swallow Study  Patient Details Name: MARICEL SWARTZENDRUBER MRN: [id number] Date of Birth: 1950/11/02 Today's Date: 11/14/2021 Time: SLP Start Time (ACUTE ONLY): 0850 -SLP Stop Time (ACUTE ONLY): 0910 SLP Time Calculation (min) (ACUTE ONLY): 20 min Past Medical History: Past Medical History: Diagnosis Date  Anxiety   Arthritis   Asthma   Cardiac arrest ([place]) 10/28/2021   Cardiac arrest ([place]) 10/28/2021  Depression   Diabetes mellitus without complication (HCC)   Fibromyalgia   GERD (gastroesophageal reflux disease)   Hyperlipemia   Hypertension   PONV (postoperative nausea and vomiting)  Past Surgical History: Past Surgical History: Procedure Laterality Date  COLONOSCOPY    DILATION AND CURETTAGE OF UTERUS    IR EMBO ART  VEN HEMORR LYMPH EXTRAV  INC GUIDE ROADMAPPING  11/01/2021  IR THROMBECT PRIM MECH INIT (INCLU) MOD SED  10/29/2021  IR US GUIDE VASC ACCESS RIGHT  10/29/2021  IR US GUIDE VASC ACCESS RIGHT  11/01/2021  KNEE ARTHROSCOPY  2003,2005  left  SHOULDER ARTHROSCOPY    left  TRIGGER FINGER RELEASE  08/13/2012  Procedure: RELEASE TRIGGER FINGER/A-1 PULLEY;  Surgeon: [name]., MD;  Location: [facility];  Service: Orthopedics;  Laterality: Right;  long finger  TUBAL LIGATION   HPI: Pt is a 71 yo female presenting 1/1 after prolonged OOH VF arrest (20 min of CPR, also coded 2x in ED, 4 min and 3 min) secondary to massive PE complicated by DIC. She is s/p thrombectomy on 1/2. Hospital course further complicated by AKI requiring CRRT, hemorrhagic shock due to hepatic bleeding, vomiting wtih TF. PMH includes DM, HTN, HLD, GERD, anxiety, depression, fibromyalgia  Subjective: eager for swallow study  Recommendations for follow up therapy are one component of a multi-disciplinary discharge planning process, led by the attending physician.  Recommendations may be updated based on patient status, additional functional criteria and insurance authorization. Assessment / Plan / Recommendation Clinical Impressions 11/14/2021 Clinical Impression Pt has a mild oropharyngeal dysphagia, improving after first few boluses after giving her a little time to warm up. She had penetration with nectar thick liquids via spoon initially, but progressed up to thin liquids with a single instance of trace penetration that was shallow and readily cleared with a cued throat clear. Lingual  propulsion is a little weak and she has a tendency to spill boluses into her valleculae before she swallows, but no aspiration is observed. She did not want to try much in terms of foods, even purees. She did not consume anything more solid than puree, referencing a burning sensation on her tongue. She would rather start with liquids and this may be a good starting point given her current level of endurance as well. Will start with full liquid diet, ensuring upright positioning as much as possible during PO intake and still maintaining frequent oral care. SLP Visit Diagnosis Dysphagia, unspecified (R13.10) Attention and concentration deficit following -- Frontal lobe and executive function deficit following -- Impact on safety and function Mild aspiration risk   Treatment Recommendations 11/14/2021 Treatment Recommendations Therapy as outlined in treatment plan below   Prognosis 11/14/2021 Prognosis for Safe Diet Advancement Good Barriers to Reach Goals -- Barriers/Prognosis Comment -- Diet Recommendations 11/14/2021 SLP Diet Recommendations Thin liquid Liquid Administration via Cup;Straw Medication Administration Crushed with puree Compensations Slow rate;Small sips/bites Postural Changes Seated upright at 90 degrees   Other Recommendations 11/14/2021 Recommended Consults -- Oral Care Recommendations Oral care BID Other Recommendations -- Follow Up Recommendations Acute inpatient rehab (3hours/day) Assistance recommended at discharge Intermittent Supervision/Assistance Functional Status Assessment Patient has had a recent decline in their functional status and demonstrates the ability to make significant improvements in function in a reasonable and predictable amount of time. Frequency and Duration  11/14/2021 Speech Therapy Frequency (ACUTE ONLY) min 2x/week Treatment Duration 2 weeks   Oral Phase 11/14/2021 Oral Phase Impaired Oral - Pudding Teaspoon -- Oral - Pudding Cup -- Oral - Honey Teaspoon -- Oral - Honey Cup --  Oral - Nectar Teaspoon Weak lingual manipulation Oral - Nectar Cup -- Oral - Nectar Straw Weak  lingual manipulation Oral - Thin Teaspoon -- Oral - Thin Cup Weak lingual manipulation Oral - Thin Straw Weak lingual manipulation Oral - Puree Weak lingual manipulation;Delayed oral transit Oral - Mech Soft -- Oral - Regular -- Oral - Multi-Consistency -- Oral - Pill -- Oral Phase - Comment --  Pharyngeal Phase 11/14/2021 Pharyngeal Phase Impaired Pharyngeal- Pudding Teaspoon -- Pharyngeal -- Pharyngeal- Pudding Cup -- Pharyngeal -- Pharyngeal- Honey Teaspoon -- Pharyngeal -- Pharyngeal- Honey Cup -- Pharyngeal -- Pharyngeal- Nectar Teaspoon Reduced airway/laryngeal closure;Penetration/Aspiration during swallow Pharyngeal Material enters airway, remains ABOVE vocal cords and not ejected out Pharyngeal- Nectar Cup -- Pharyngeal -- Pharyngeal- Nectar Straw WFL Pharyngeal -- Pharyngeal- Thin Teaspoon -- Pharyngeal -- Pharyngeal- Thin Cup WFL Pharyngeal -- Pharyngeal- Thin Straw Reduced airway/laryngeal closure;Penetration/Aspiration during swallow Pharyngeal Material enters airway, remains ABOVE vocal cords and not ejected out Pharyngeal- Puree WFL Pharyngeal -- Pharyngeal- Mechanical Soft -- Pharyngeal -- Pharyngeal- Regular -- Pharyngeal -- Pharyngeal- Multi-consistency -- Pharyngeal -- Pharyngeal- Pill -- Pharyngeal -- Pharyngeal Comment --  Cervical Esophageal Phase  11/14/2021 Cervical Esophageal Phase WFL Pudding Teaspoon -- Pudding Cup -- Honey Teaspoon -- Honey Cup -- Nectar Teaspoon -- Nectar Cup -- Nectar Straw -- Thin Teaspoon -- Thin Cup -- Thin Straw -- Puree -- Mechanical Soft -- Regular -- Multi-consistency -- Pill -- Cervical Esophageal Comment -- Osie Bond., M.A. [facility] Pager [phone] Office [phone] 11/14/2021, 9:34 AM                     EEG adult  Result Date: 11/04/2021 Greta Doom, MD     11/04/2021  3:33 PM History: 71 yo F with encephalopathy Sedation:  fentanyl prn Technique: This EEG was acquired with electrodes placed according to the International 10-20 electrode system (including Fp1, Fp2, F3, F4, C3, C4, P3, P4, O1, O2, T3, T4, T5, T6, A1, A2, Fz, Cz, Pz). The following electrodes were missing or displaced: none. Background: The background consists predominantly of generalized irregular delta activity.  There is some superimposed irregular theta as well.  There is no clear posterior dominant rhythm.  There was no epileptiform discharges seen.  Clear sleep structures were not seen. Photic stimulation: Physiologic driving is now performed EEG Abnormalities: 1) generalized irregular slow activity 2) absent posterior dominant rhythm Clinical Interpretation: This EEG is consistent with a generalized non-specific cerebral dysfunction(encephalopathy). There was no seizure or seizure predisposition recorded on this study. Please note that lack of epileptiform activity on EEG does not preclude the possibility of epilepsy. Roland Rack, MD [facility] [phone] If 7pm- 7am, please page neurology on call as listed in [place].   EEG adult  Result Date: 10/28/2021 Lora Havens, MD     10/28/2021  9:19 PM Patient Name: [name] MRN: [id number] Epilepsy Attending: Lora Havens Referring Physician/Provider: Donita Brooks, NP Date: 10/28/2021 Duration: 21.01 mins Patient history: 71yo F s/p cardiac arrest. EEG to evaluate for seizure Level of alertness: comatos AEDs during EEG study: None Technical aspects: This EEG study was done with scalp electrodes positioned according to the 10-20 International system of electrode placement. Electrical activity was acquired at a sampling rate of 500Hz  and reviewed with a high frequency filter of 70Hz  and a low frequency filter of 1Hz . EEG data were recorded continuously and digitally stored. Description: EEG showed near continuous generalized low amplitude 3 to 6 Hz theta-delta slowing admixed with 2-4  seconds of eeg attenuation. There was also 15-18hz  frontocentral beta activity intermittently.  Hyperventilation and photic stimulation were not performed.   ABNORMALITY - Continuous slow, generalized - Background attenuation, generalized IMPRESSION: This study is suggestive of severe to profound diffuse encephalopathy, nonspecific etiology. No seizures or epileptiform discharges were seen throughout the recording. Priyanka Barbra Sarks   Overnight EEG with video  Result Date: 10/29/2021 Lora Havens, MD     10/30/2021  8:50 AM Patient Name: [name] MRN: [id number] Epilepsy Attending: Lora Havens Referring Physician/Provider: Donita Brooks, NP Duration: 10/28/2021 1920 to 10/29/2021 1920  Patient history: 71yo F s/p cardiac arrest. EEG to evaluate for seizure  Level of alertness: lethargic  AEDs during EEG study: None  Technical aspects: This EEG study was done with scalp electrodes positioned according to the 10-20 International system of electrode placement. Electrical activity was acquired at a sampling rate of 500Hz  and reviewed with a high frequency filter of 70Hz  and a low frequency filter of 1Hz . EEG data were recorded continuously and digitally stored.  Description: EEG showed near continuous generalized low amplitude 3 to 6 Hz theta-delta slowing admixed with 2-4 seconds of eeg attenuation. There was also 15-18hz  frontocentral beta activity intermittently. Hyperventilation and photic stimulation were not performed.   Patient event button was pressed on 10/29/2021 at 1057 and 1213 for shaking while being repositioned.  Concomitant EEG before, during and after the event did not show any EEG changes suggest seizure.  ABNORMALITY - Continuous slow, generalized - Background attenuation, generalized  IMPRESSION: This study is suggestive of severe to profound diffuse encephalopathy, nonspecific etiology. No seizures or epileptiform discharges were seen throughout the recording. Patient event button was  pressed on 10/29/2021 at 1057 and 1213 for shaking while being repositioned without concomitant EEG change.  These episodes were not epileptic.  Lora Havens   ECHOCARDIOGRAM COMPLETE  Result Date: 10/28/2021    ECHOCARDIOGRAM REPORT   Patient Name:   MAUI AHART Date of Exam: 10/28/2021 Medical Rec #:  [id number]       Height:       65.0 in Accession #:    [id number]      Weight:       170.0 lb Date of Birth:  1951-05-08       BSA:          1.846 m Patient Age:    89 years        BP:           110/69 mmHg Patient Gender: F               HR:           104 bpm. Exam Location:  Inpatient Procedure: 2D Echo, Color Doppler and Limited Color Doppler STAT ECHO Indications:    Cardiac arrest  History:        Patient has prior history of Echocardiogram examinations.                 Hemoptysis. Respiratory failure requiring mechanical                 ventilation.  Sonographer:    Merrie Roof RDCS Referring Phys: [id number] [facility]  Sonographer Comments: Suboptimal apical window, echo performed with patient supine and on artificial respirator and Technically difficult study due to poor echo windows. IMPRESSIONS  1. Right ventricular function is abnormal. In the setting of cardiac arrest, would rule out pulmonary embolism if clinically indicated.  2. Left ventricular ejection fraction, by estimation, is 70 to 75%. The  left ventricle has hyperdynamic function. The left ventricle has no regional wall motion abnormalities. There is moderate concentric left ventricular hypertrophy. Left ventricular diastolic parameters are indeterminate.  3. Hypokinesis of the mid-apical right ventricle with normal systolic function at the base. This pattern is opposite of McConnell's sign, which can be seen in acute pulmonary embolism. Right ventricular systolic function is moderately reduced. The right  ventricular size is normal. There is severely elevated pulmonary artery systolic pressure.  4. Systolic anterior motion (SAM) of the  mitral valve. The mitral valve is normal in structure. No evidence of mitral valve regurgitation. No evidence of mitral stenosis.  5. Tricuspid valve regurgitation is moderate to severe.  6. The aortic valve is tricuspid. Aortic valve regurgitation is not visualized. No aortic stenosis is present.  7. The inferior vena cava is normal in size with <50% respiratory variability, suggesting right atrial pressure of 8 mmHg. FINDINGS  Left Ventricle: Left ventricular ejection fraction, by estimation, is 70 to 75%. The left ventricle has hyperdynamic function. The left ventricle has no regional wall motion abnormalities. The left ventricular internal cavity size was normal in size. There is moderate concentric left ventricular hypertrophy. Left ventricular diastolic parameters are indeterminate. Right Ventricle: Hypokinesis of the mid-apical right ventricle with normal systolic function at the base. This pattern is opposite of McConnell's sign, which can be seen in acute pulmonary embolism. The right ventricular size is normal. No increase in right ventricular wall thickness. Right ventricular systolic function is moderately reduced. There is severely elevated pulmonary artery systolic pressure. The tricuspid regurgitant velocity is 3.84 m/s, and with an assumed right atrial pressure of 8 mmHg, the estimated right ventricular systolic pressure is 99.8 mmHg. Left Atrium: Left atrial size was normal in size. Right Atrium: Right atrial size was normal in size. Pericardium: There is no evidence of pericardial effusion. Mitral Valve: Systolic anterior motion (SAM) of the mitral valve. The mitral valve is normal in structure. No evidence of mitral valve regurgitation. No evidence of mitral valve stenosis. Tricuspid Valve: The tricuspid valve is normal in structure. Tricuspid valve regurgitation is moderate to severe. No evidence of tricuspid stenosis. Aortic Valve: The aortic valve is tricuspid. Aortic valve regurgitation is not  visualized. No aortic stenosis is present. Pulmonic Valve: The pulmonic valve was normal in structure. Pulmonic valve regurgitation is trivial. No evidence of pulmonic stenosis. Aorta: The aortic root is normal in size and structure. Venous: IVC assessment for right atrial pressure unable to be performed due to mechanical ventilation. The inferior vena cava is normal in size with less than 50% respiratory variability, suggesting right atrial pressure of 8 mmHg. IAS/Shunts: No atrial level shunt detected by color flow Doppler.  LEFT VENTRICLE PLAX 2D LVIDd:         3.20 cm LVIDs:         2.10 cm LV PW:         1.43 cm LV IVS:        1.50 cm LVOT diam:     1.70 cm LVOT Area:     2.27 cm  RIGHT VENTRICLE          IVC RV Basal diam:  4.00 cm  IVC diam: 1.90 cm RV Mid diam:    4.00 cm LEFT ATRIUM         Index LA diam:    3.60 cm 1.95 cm/m   AORTA Ao Root diam: 3.30 cm Ao Asc diam:  2.90 cm TRICUSPID VALVE TR Peak grad:  59.0 mmHg TR Vmax:        384.00 cm/s  SHUNTS Systemic Diam: 1.70 cm Skeet Latch MD Electronically signed by Skeet Latch MD Signature Date/Time: 10/28/2021/3:55:58 PM    Final    IR EMBO ART  VEN HEMORR LYMPH EXTRAV  INC GUIDE ROADMAPPING  Result Date: 11/01/2021 INDICATION: 71 year old woman with acute anemia, hypotension, tachycardia due to large volume intraperitoneal hemorrhage originating from left liver lobe lesion presents to IR for angiogram and embolization. EXAM: 1. Ultrasound-guided access of right common femoral artery 2. Celiac angiogram 3. Common hepatic angiogram 4. Left hepatic angiogram and Gel-Foam embolization 5. Angio-Seal closure of right common femoral artery access MEDICATIONS: None ANESTHESIA/SEDATION: None CONTRAST:  42 mL Intra-arterial Omnipaque 300 FLUOROSCOPY TIME:  Fluoroscopy Time: 7 minutes 42 seconds (466 mGy). COMPLICATIONS: None immediate. PROCEDURE: Informed consent was obtained from the patient's daughter's following explanation of the procedure, risks,  benefits and alternatives. The patient's family understands, agrees and consents for the procedure. All questions were addressed. A time out was performed prior to the initiation of the procedure. Maximal barrier sterile technique utilized including caps, mask, sterile gowns, sterile gloves, large sterile drape, hand hygiene, and chlorhexidine prep. Patient positioned supine on the angiography table. The right groin skin prepped and draped in usual fashion. Ultrasound image documenting patency of the right common femoral artery was obtained and placed in permanent medical record. Sterile ultrasound probe cover and gel utilized throughout the procedure. Using continuous ultrasound guidance, a 21 gauge needle was used to access the right common femoral artery at the level of the femoral head. 21 gauge needle exchanged for a transitional dilator set over 0.018 inch guidewire. Transitional dilator set exchanged for 5 French sheath over 0.035 inch guidewire. Celiac artery accessed with sauce omni catheter. Celiac angiogram showed patent left gastric, splenic, and common hepatic arteries. Microcatheter advanced into the common hepatic artery and angiogram was performed. The gastro duodenal and right hepatic arteries are patent. There is acute cut off of the left hepatic artery likely due to spasm related to hemorrhage in the left hepatic lobe. Microcatheter was advanced into the left hepatic artery an angiogram was performed which again showed the acute cut off of the left hepatic artery. No active extravasation was identified. Given that the CT showed hemorrhage within the left liver lobe, embolization of the left hepatic artery was performed with Gel-Foam slurry until minimal antegrade flow was seen. Post embolization angiogram showed little flow into the left hepatic lobe and reflux into the right hepatic lobe artery. Sheath angiogram showed appropriate puncture of the common femoral artery at the level the femoral  head. The groin was re-prepped and draped and closure was performed with 6 [place] Angio-Seal device. IMPRESSION: Successful embolization of the left hepatic artery with Gel-Foam slurry. Electronically Signed   By: Miachel Roux M.D.   On: 11/01/2021 14:52   VAS [name] LOWER EXTREMITY VENOUS (DVT)  Result Date: 11/06/2021  Lower Venous DVT Study Patient Name:  [name]  Date of Exam:   11/06/2021 Medical Rec #: [id number]        Accession #:    [id number] Date of Birth: 1951/10/18        Patient Gender: F Patient Age:   69 years Exam Location:  [facility] Procedure:      VAS [name] LOWER EXTREMITY VENOUS (DVT) Referring Phys: Noemi Chapel --------------------------------------------------------------------------------  Indications: Pain.  Risk Factors: None identified. Limitations: Body habitus, poor ultrasound/tissue interface and patient positioning, patient immobility.  Comparison Study: 11/02/2021 - Negative for DVT bilaterally. Performing Technologist: Oliver Hum RVT  Examination Guidelines: A complete evaluation includes B-mode imaging, spectral Doppler, color Doppler, and power Doppler as needed of all accessible portions of each vessel. Bilateral testing is considered an integral part of a complete examination. Limited examinations for reoccurring indications may be performed as noted. The reflux portion of the exam is performed with the patient in reverse Trendelenburg.  +---------+---------------+---------+-----------+----------+--------------+  RIGHT     Compressibility Phasicity Spontaneity Properties Thrombus Aging  +---------+---------------+---------+-----------+----------+--------------+  CFV       Full            Yes       Yes                                    +---------+---------------+---------+-----------+----------+--------------+  SFJ       Full                                                             +---------+---------------+---------+-----------+----------+--------------+  FV  Prox   Full                                                             +---------+---------------+---------+-----------+----------+--------------+  FV Mid    Full                                                             +---------+---------------+---------+-----------+----------+--------------+  FV Distal                 Yes       Yes                                    +---------+---------------+---------+-----------+----------+--------------+  PFV       Full                                                             +---------+---------------+---------+-----------+----------+--------------+  POP       Full            Yes       Yes                                    +---------+---------------+---------+-----------+----------+--------------+  PTV       Full                                                             +---------+---------------+---------+-----------+----------+--------------+  PERO      Full                                                             +---------+---------------+---------+-----------+----------+--------------+   +----+---------------+---------+-----------+----------+--------------+  LEFT Compressibility Phasicity Spontaneity Properties Thrombus Aging  +----+---------------+---------+-----------+----------+--------------+  CFV  Full            Yes       Yes                                    +----+---------------+---------+-----------+----------+--------------+    Summary: RIGHT: - There is no evidence of deep vein thrombosis in the lower extremity. However, portions of this examination were limited- see technologist comments above.  - No cystic structure found in the popliteal fossa.  LEFT: - No evidence of common femoral vein obstruction.  *See table(s) above for measurements and observations. Electronically signed by Harold Barban MD on 11/06/2021 at 9:44:21 PM.    Final    VAS [name] LOWER EXTREMITY VENOUS (DVT)  Result Date: 11/02/2021  Lower Venous DVT Study Patient Name:   [name]  Date of Exam:   11/02/2021 Medical Rec #: [id number]        Accession #:    [id number] Date of Birth: Oct 31, 1950        Patient Gender: F Patient Age:   61 years Exam Location:  [facility] Procedure:      VAS [name] LOWER EXTREMITY VENOUS (DVT) Referring Phys: Kipp Brood --------------------------------------------------------------------------------  Indications: Pulmonary embolism.  Limitations: Body habitus and poor ultrasound/tissue interface. Comparison Study: no prior Performing Technologist: Archie Patten RVS  Examination Guidelines: A complete evaluation includes B-mode imaging, spectral Doppler, color Doppler, and power Doppler as needed of all accessible portions of each vessel. Bilateral testing is considered an integral part of a complete examination. Limited examinations for reoccurring indications may be performed as noted. The reflux portion of the exam is performed with the patient in reverse Trendelenburg.  +---------+---------------+---------+-----------+----------+--------------+  RIGHT     Compressibility Phasicity Spontaneity Properties Thrombus Aging  +---------+---------------+---------+-----------+----------+--------------+  CFV       Full            Yes       Yes                                    +---------+---------------+---------+-----------+----------+--------------+  SFJ       Full                                                             +---------+---------------+---------+-----------+----------+--------------+  FV Prox   Full                                                             +---------+---------------+---------+-----------+----------+--------------+  FV Mid                    Yes       Yes                                    +---------+---------------+---------+-----------+----------+--------------+  FV Distal                 Yes       Yes                                    +---------+---------------+---------+-----------+----------+--------------+   PFV       Full                                                             +---------+---------------+---------+-----------+----------+--------------+  POP       Full            Yes       Yes                                    +---------+---------------+---------+-----------+----------+--------------+  PTV       Full                                                             +---------+---------------+---------+-----------+----------+--------------+  PERO      Full                                                             +---------+---------------+---------+-----------+----------+--------------+   +---------+---------------+---------+-----------+----------+--------------+  LEFT      Compressibility Phasicity Spontaneity Properties Thrombus Aging  +---------+---------------+---------+-----------+----------+--------------+  CFV       Full            Yes       Yes                                    +---------+---------------+---------+-----------+----------+--------------+  SFJ       Full                                                             +---------+---------------+---------+-----------+----------+--------------+  FV Prox   Full                                                             +---------+---------------+---------+-----------+----------+--------------+  FV Mid                    Yes       Yes                                    +---------+---------------+---------+-----------+----------+--------------+  FV Distal                 Yes       Yes                                    +---------+---------------+---------+-----------+----------+--------------+  PFV       Full                                                             +---------+---------------+---------+-----------+----------+--------------+  POP       Full            Yes       Yes                                    +---------+---------------+---------+-----------+----------+--------------+  PTV       Full                                                              +---------+---------------+---------+-----------+----------+--------------+  PERO      Full                                                             +---------+---------------+---------+-----------+----------+--------------+     Summary: BILATERAL: - No evidence of deep vein thrombosis seen in the lower extremities, bilaterally. -No evidence of popliteal cyst, bilaterally.   *See table(s) above for measurements and observations. Electronically signed by Orlie Pollen on 11/02/2021 at 6:56:05 PM.    Final    US Abdomen Limited RUQ (LIVER/GB)  Result Date: 10/28/2021 CLINICAL DATA:  Abnormal CT, evaluate gallbladder. EXAM: ULTRASOUND ABDOMEN LIMITED RIGHT UPPER QUADRANT COMPARISON:  CT abdomen and pelvis 10/28/2021. FINDINGS: Gallbladder: No gallstones are identified. There is gallbladder wall thickening measuring up to 5.6 mm. Pericholecystic fluid is present. No sonographic Murphy sign noted by sonographer. Common bile duct: Diameter: 4.2 mm. Liver: There is a complex hypoechoic area measuring 1.7 x 1.4 x 1.3 cm in the left lobe of the liver. This measures smaller than lesion on CT and remains indeterminate. Within normal limits in parenchymal echogenicity. Portal vein is patent on color Doppler imaging with normal direction of blood flow towards the liver. Other: Trace ascites. IMPRESSION: 1. Gallbladder wall thickening and pericholecystic fluid concerning for acute or chronic cholecystitis. No gallstones or biliary ductal dilatation. 2. Indeterminate left hepatic lesion. Recommend further  evaluation with MRI. 3. Trace ascites. Electronically Signed   By: Ronney Asters M.D.   On: 10/28/2021 21:53    Labs:  CBC: Recent Labs    11/16/21 0444 11/17/21 0515 11/18/21 0604 11/19/21 0516  WBC 14.6* 13.5* 12.6* 14.4*  HGB 8.2* 8.4* 8.2* 8.4*  HCT 25.0* 25.5* 24.4* 25.6*  PLT 12* 16* 19* 29*    COAGS: Recent Labs    11/04/21 1606 11/05/21 0500 11/06/21 0356  11/07/21 1500 11/07/21 2340 11/17/21 1710 11/18/21 0604 11/18/21 1850 11/19/21 0516  INR 1.3* 1.3* 1.3* 1.2  --   --   --   --   --   APTT 34 46* 55* 100*   < > 70* 80* 67* 77*   < > = values in this interval not displayed.    BMP: Recent Labs    11/16/21 0444 11/17/21 0516 11/18/21 0604 11/19/21 0516  NA 131* 130* 133* 134*  K 3.9 5.0 5.1 6.2*  CL 102 96* 100 101  CO2 22 18* 21* 18*  GLUCOSE 189* 314* 203* 228*  BUN 95* 147* 105* 143*  CALCIUM 7.2* 8.0* 8.1* 8.2*  CREATININE 3.32* 5.22* 4.32* 5.94*  GFRNONAA 14* 8* 10* 7*    LIVER FUNCTION TESTS: Recent Labs    11/11/21 0500 11/11/21 1708 11/12/21 0432 11/12/21 1630 11/15/21 0407 11/15/21 1601 11/16/21 0444 11/17/21 0516 11/18/21 0604 11/19/21 0516  BILITOT 1.7*  --  1.9*  --  1.5*  --  1.5*  --   --   --   AST 117*  --  98*  --  107*  --  58*  --   --   --   ALT 79*  --  69*  --  101*  --  72*  --   --   --   ALKPHOS 236*  --  221*  --  222*  --  163*  --   --   --   PROT 5.6*  --  6.6  --  5.8*  --  5.9*  --   --   --   ALBUMIN 2.3*   < > 2.6*   < > 2.3*   < > 3.0*   2.9* 2.6* 2.8* 2.8*   < > = values in this interval not displayed.    Assessment and Plan:  71 y.o. female inpatient. History DM, HTN, HLD, GERD. Admitted for massive PE s/p mechanical thrombectomy, prolonged cardiac arrest.Now in renal failure with suspected DIC. Patient has a left femoral temp cath placed by ICU on 1.4.263 and a RIJ central line, Team is requesting a tunneled HD catheter for ongoing dialysis access.   PLT 29, Potassium 6.2, BUN 143, Cr 5.94, Albumin 2.8, WBC 14.4. See list for pertinent allergies.   IR consulted for possible tunneled HD catheter. Case has been reviewed and procedure approved by Dr. Jacqualyn Posey.  Patient tentatively scheduled for 1.24.22.  Team instructed to: Keep Patient to be NPO after midnight  IR will call patient when ready.  Risks and benefits discussed with the patient including, but not limited to  bleeding, infection, vascular injury, pneumothorax which may require chest tube placement, air embolism or even death  All of the patient and her daughter's questions were answered, patient is agreeable to proceed. Consent signed and in chart.   Thank you for this interesting consult.  I greatly enjoyed meeting ANGALENA [place] and look forward to participating in their care.  A copy of this report was sent to  the requesting provider on this date.  Electronically Signed: Jacqualine Mau, NP 11/19/2021, 9:48 AM   I spent a total of 40 Minutes    in face to face in clinical consultation, greater than 50% of which was counseling/coordinating care for tunneled HD catheter placement

## 2021-11-19 NOTE — Progress Notes (Signed)
Physical Therapy Treatment Patient Details Name: [name] MRN: [id number] DOB: Oct 11, 1951 Today's Date: 11/19/2021   History of Present Illness Pt is a 71 y.o. female admitted 10/28/21 after cardiac arrest requiring 20-min CPR for ROSC; ETT in the field. Pt had 2x additional codes in ED requiring CPR for 4 and 2-min. Workup for STEMI, cardiogenic shock, large saddle PE, RUQ bleeding, 2 rib fxs. S/p PE thrombectomy 1/2. Pt with intraperitoneal hemorrhage s/p embolization 1/5. ETT 1/1-1/10. CRRT 1/4-1/11; trialled iHD 1/12; CRRT again 1/15-1/18. Began iHD again 1/20.  PMH includes fibromyalgia, HTN, DM2, CKD, GERD, asthma.    PT Comments    Pt tolerated more activity than during last session but will need improved activity tolerance if acute inpatient rehab is to be a possibility. HD seems to really be fatiguing her. Hopefully will adjust and begin to make progress.    Recommendations for follow up therapy are one component of a multi-disciplinary discharge planning process, led by the attending physician.  Recommendations may be updated based on patient status, additional functional criteria and insurance authorization.  Follow Up Recommendations  Acute inpatient rehab (3hours/day)     Assistance Recommended at Discharge Frequent or constant Supervision/Assistance  Patient can return home with the following Two people to help with walking and/or transfers;Assistance with cooking/housework;Assist for transportation;Help with stairs or ramp for entrance;A lot of help with bathing/dressing/bathroom   Equipment Recommendations  Rolling walker (2 wheels);Wheelchair (measurements PT);Wheelchair cushion (measurements PT)    Recommendations for Other Services       Precautions / Restrictions Precautions Precautions: Fall Precaution Comments: L femoral non-tunneled HD cath - MD cleared getting up with fem catheter     Mobility  Bed Mobility Overal bed mobility: Needs Assistance Bed  Mobility: Supine to Sit   Sidelying to sit: Mod assist, HOB elevated, +2 for physical assistance       General bed mobility comments: Assist to elevate trunk into sitting and bringing hips to EOB.    Transfers Overall transfer level: Needs assistance Equipment used: Ambulation equipment used Transfers: Sit to/from Stand, Bed to chair/wheelchair/BSC Sit to Stand: Max assist, +2 physical assistance           General transfer comment: Used bed pad under hips to bring hips up. Unable to fully extend hips and trunk. Stood twice with Charlaine Dalton and used to get to recliner. Transfer via Lift Equipment: Stedy  Ambulation/Gait               General Gait Details: Scientific laboratory technician Rankin (Stroke Patients Only)       Balance Overall balance assessment: Needs assistance Sitting-balance support: No upper extremity supported, Feet supported Sitting balance-Leahy Scale: Fair Sitting balance - Comments: Static sitting with min guard for safety and lines   Standing balance support: Bilateral upper extremity supported, Reliant on assistive device for balance Standing balance-Leahy Scale: Poor Standing balance comment: Stedy and +2 max for static standing                            Cognition Arousal/Alertness: Awake/alert Behavior During Therapy: WFL for tasks assessed/performed Overall Cognitive Status: Impaired/Different from baseline Area of Impairment: Attention, Following commands, Safety/judgement, Awareness, Problem solving                   Current Attention Level: Selective   Following  Commands: Follows one step commands with increased time Safety/Judgement: Decreased awareness of deficits Awareness: Emergent Problem Solving: Slow processing, Decreased initiation, Requires verbal cues General Comments: Pt very fatigued and with decreased activity tolerance impacting her cognition. Requiring increased  time and cues. Noting pt verbalized feeling overwhelmed by functional change.        Exercises      General Comments General comments (skin integrity, edema, etc.): VSS on RA      Pertinent Vitals/Pain Pain Assessment Pain Assessment: Faces Faces Pain Scale: Hurts a little bit Pain Location: RLE with movement Pain Descriptors / Indicators: Grimacing, Guarding, Sore Pain Intervention(s): Monitored during session    Home Living                          Prior Function            PT Goals (current goals can now be found in the care plan section) Progress towards PT goals: Not progressing toward goals - comment (fatigue)    Frequency    Min 3X/week      PT Plan Current plan remains appropriate    Co-evaluation PT/OT/SLP Co-Evaluation/Treatment: Yes Reason for Co-Treatment: Complexity of the patient's impairments (multi-system involvement);For patient/therapist safety PT goals addressed during session: Mobility/safety with mobility OT goals addressed during session: ADL's and self-care      AM-PAC PT "6 Clicks" Mobility   Outcome Measure  Help needed turning from your back to your side while in a flat bed without using bedrails?: A Lot Help needed moving from lying on your back to sitting on the side of a flat bed without using bedrails?: Total Help needed moving to and from a bed to a chair (including a wheelchair)?: Total Help needed standing up from a chair using your arms (e.g., wheelchair or bedside chair)?: Total Help needed to walk in hospital room?: Total Help needed climbing 3-5 steps with a railing? : Total 6 Click Score: 7    End of Session   Activity Tolerance: Patient limited by fatigue Patient left: in chair;with call bell/phone within reach;with chair alarm set Nurse Communication: Mobility status;Need for lift equipment PT Visit Diagnosis: Unsteadiness on feet (R26.81);Other abnormalities of gait and mobility (R26.89);Muscle weakness  (generalized) (M62.81)     Time: 6073-7106 PT Time Calculation (min) (ACUTE ONLY): 29 min  Charges:  $Therapeutic Activity: 8-22 mins                     Wilmar Pager [phone] Office [place] 11/19/2021, 12:25 PM

## 2021-11-19 NOTE — Progress Notes (Signed)
[facility] Progress Note     Assessment/ Plan:   Renal failure secondary to ATN in the setting of cardiac arrest, hypotension, pressors, contrast.  BL CKD3A with BL creatinine in the 1.17-1.3 range in late 2021. - L fem HD cath-  been in now for 19 days -  plans to switch to tunneled IJ , platelets have been issue-  CCM note says that they want platelets over 20 which they are today - will re consult IR  HD 11/08/21--> had to have pressor with it - HD treatment 11/10/20--> cathether issues- wonder if BFR wasn't achieved--> BUN up, breathing worse, + fluid balance again, AMS - resumed CRRT 1/15 -18.  Hemodynamics improved.  - Tolerated iHF 1/19, 1/21.  Next planned would be Tues.  K of 6.2-  wont make it til tomorrow-  I have d/w HD RN who promises we can get it done in the next 6 hours so will do that   Prolonged VF cardiac arrest--> OOH VF arrest 2/2 massive PE  Massive PE s/p mechanical thrombectomy 1/2 of R and L PA.  Vasc US- no DVT, liver mass noted 10/28/21 CT abd/ pelvis, s/p MRI abd--> incompletely characterized, had MRI with gadolinium 1/20  to reevaluate -- not definitive.      Retroperitoneal bleed/Hemorrhagic shock/Acute blood loss anemia originating from left liver lobe s/p embolization 1/4--> required FFP, plts, Vit K, pRBCs; now stable. Hb in 8s now.  Hematology following - deferring any ESA to them.   Thrombocytopenia- component of DIC suspected, HIT negative, on bivalirudin and avoiding heparin per heme, getting platelet transfusions PRN, heme involved, suspects ITP, getting Nplate PRN and dex.  Trending up now   Acute hypoxic RF- extubated 11/07/21, now weaned to RA  Sepsis/ leukocytosis/ aspiration pneumonia- MRI with aspiration PNA.  S/p  CTX and vanc   FEN: off TPN now, on Ensure, prosource and vital 1.5-  will change the ensure to nepro due to high K content   Subjective:    Labs this AM showing K of 6.2 and BUN over 140 -  was not to be due for HD  today - she is alert, "kinda sorta " nauseated     Objective:   BP (!) 143/73    Pulse 81    Temp 99 F (37.2 C) (Axillary)    Resp 14    Ht 5\' 5"  (1.651 m)    Wt 71.9 kg    SpO2 96%    BMI 26.38 kg/m   Intake/Output Summary (Last 24 hours) at 11/19/2021 0748 Last data filed at 11/19/2021 0600 Gross per 24 hour  Intake 1099.65 ml  Output --  Net 1099.65 ml   Weight change: 9.7 kg  Physical Exam: GEN: NAD,lying in bed HEENT:  sclerae anicteric NECK: Supple, no thyromegaly LUNGS: normal WOB on HFNC, coarse ant, no wheezing or rales CV: Reg rate ABD: soft, nontender EXT: edema resolved  Imaging: No results found.  Labs: BMET Recent Labs  Lab 11/14/21 1722 11/15/21 0407 11/15/21 1601 11/16/21 0444 11/17/21 0516 11/18/21 0604 11/19/21 0516  NA 132* 131* 140 131* 130* 133* 134*  K 4.8 4.3 3.7 3.9 5.0 5.1 6.2*  CL 97* 96* 96* 102 96* 100 101  CO2 20* 19* 24 22 18* 21* 18*  GLUCOSE 214* 186* 183* 189* 314* 203* 228*  BUN 98* 133* 46* 95* 147* 105* 143*  CREATININE 2.81* 3.76* 1.77* 3.32* 5.22* 4.32* 5.94*  CALCIUM 8.4* 8.2* 9.3 7.2* 8.0* 8.1* 8.2*  PHOS 4.2 5.9* 2.7 5.2* 8.8* 7.9* 10.5*   CBC Recent Labs  Lab 11/16/21 0444 11/17/21 0515 11/18/21 0604 11/19/21 0516  WBC 14.6* 13.5* 12.6* 14.4*  HGB 8.2* 8.4* 8.2* 8.4*  HCT 25.0* 25.5* 24.4* 25.6*  MCV 89.0 89.2 89.4 88.9  PLT 12* 16* 19* 29*    Medications:     atorvastatin  20 mg Oral Daily   B-complex with vitamin C  1 tablet Oral Daily   chlorhexidine  15 mL Mouth Rinse BID   Chlorhexidine Gluconate Cloth  6 each Topical Daily   Chlorhexidine Gluconate Cloth  6 each Topical Q0600   feeding supplement  237 mL Oral TID BM   feeding supplement (PROSource TF)  45 mL Per Tube BID   folic acid  2 mg Oral Daily   insulin aspart  0-9 Units Subcutaneous Q4H   insulin aspart  3 Units Subcutaneous Q4H   insulin detemir  12 Units Subcutaneous BID   mouth rinse  15 mL Mouth Rinse q12n4p   midodrine  10 mg Oral  TID WC   pantoprazole (PROTONIX) IV  40 mg Intravenous Q1400    Lonisha Bobby A [place] [facility] Pager [phone]

## 2021-11-19 NOTE — Progress Notes (Signed)
Speech Language Pathology Treatment: Dysphagia  Patient Details Name: [name] MRN: [id number] DOB: 04/07/1951 Today's Date: 11/19/2021 Time: 1005-1020 SLP Time Calculation (min) (ACUTE ONLY): 15 min  Assessment / Plan / Recommendation Clinical Impression  Pt seated in recliner after working with therapies.  Dtr, [name], at bedside.  Offered advanced textures to determine if we can advance diet from puree. Intake has been minimal per RD, and [name] reports that her mother really dislikes the pureed food.  Unfortunately, upper dentures were lost in ED and MariaHahn doesn't like to eat without them.  She accepted limited amounts of cracker and peaches, spitting out most of boluses because she stated she couldn't chew them. She is doing well with thin liquids and there were no concerns for airway compromise.  D/W daughter - recommend a trial of dysphagia 2/chopped solids to see if those trays might be more enticing to eat.  [name] agrees.  D/W RN. SLP will follow.   HPI HPI: Pt is a 71 yo female presenting 1/1 after prolonged OOH VF arrest (20 min of CPR, also coded 2x in ED, 4 min and 3 min) secondary to massive PE complicated by DIC. She is s/p thrombectomy on 1/2. She was orally intubated for ten days. Hospital course further complicated by AKI requiring CRRT, hemorrhagic shock due to hepatic bleeding, vomiting wtih TF. PMH includes DM, HTN, HLD, GERD, anxiety, depression, fibromyalgia      SLP Plan  Continue with current plan of care      Recommendations for follow up therapy are one component of a multi-disciplinary discharge planning process, led by the attending physician.  Recommendations may be updated based on patient status, additional functional criteria and insurance authorization.    Recommendations  Diet recommendations: Dysphagia 2 (fine chop);Thin liquid Liquids provided via: Cup;Straw Medication Administration: Crushed with puree Supervision: Staff to assist with self  feeding;Full supervision/cueing for compensatory strategies Compensations: Slow rate;Small sips/bites;Clear throat intermittently                Oral Care Recommendations: Oral care BID Follow Up Recommendations: Acute inpatient rehab (3hours/day) Assistance recommended at discharge: Frequent or constant Supervision/Assistance SLP Visit Diagnosis: Dysphagia, oropharyngeal phase (R13.12) Plan: Continue with current plan of care         [name] L. Tivis Hahn, [place] CCC/SLP Acute Rehabilitation Services Office number [phone] Pager [phone]   [name] [name]  11/19/2021, 10:27 AM

## 2021-11-19 NOTE — Progress Notes (Signed)
Inpatient Rehab Admissions Coordinator:   CIR continues to follow for medical readiness and to assist with planning rehab venue options, but Pt. Is currently not medically ready for CIR.   Clemens Catholic, [place], [place] Admissions Coordinator  [phone] ([place]) [phone] (office)

## 2021-11-19 NOTE — Progress Notes (Signed)
It looks like the platelet count is starting to come back now.  The platelet count now 29,000.  Her BUN is 143 creatinine 5.94.  I would think that she is getting dialysis.  Her potassium is 6.2..  She still has the feeding tube in.  She is on the bivalirudin infusion.  I probably would keep her on this for right now.  I had to believe that she is going to have a some kind dialysis today.  Her phosphorus is 10.5.  Again, her potassium is 6.2.  I do not think that there has been any obvious bleeding.  Given that her platelet count is coming up, I would not think that she would need any kind of transfusion for a line change.  The bivalirudin infusion just needs to be stopped.  We really need to recheck her liver studies.  I had to believe that the platelet count is improving as her liver is improving.  Hopefully, we will continue to see an improvement in the platelet count.  I think once we know that she is able to eat, then I would try was switch her over to an oral agent for the thromboembolic disease.  [name], MD  Job 22:21

## 2021-11-20 ENCOUNTER — Inpatient Hospital Stay (HOSPITAL_COMMUNITY): Payer: Medicare PPO

## 2021-11-20 DIAGNOSIS — R110 Nausea: Secondary | ICD-10-CM | POA: Diagnosis not present

## 2021-11-20 DIAGNOSIS — I469 Cardiac arrest, cause unspecified: Secondary | ICD-10-CM | POA: Diagnosis not present

## 2021-11-20 DIAGNOSIS — D696 Thrombocytopenia, unspecified: Secondary | ICD-10-CM | POA: Diagnosis not present

## 2021-11-20 DIAGNOSIS — N179 Acute kidney failure, unspecified: Secondary | ICD-10-CM | POA: Diagnosis not present

## 2021-11-20 DIAGNOSIS — E43 Unspecified severe protein-calorie malnutrition: Secondary | ICD-10-CM | POA: Diagnosis not present

## 2021-11-20 HISTORY — PX: IR FLUORO GUIDE CV LINE RIGHT: IMG2283

## 2021-11-20 LAB — CBC
HCT: 24.8 % — ABNORMAL LOW (ref 36.0–46.0)
Hemoglobin: 8.5 g/dL — ABNORMAL LOW (ref 12.0–15.0)
MCH: 30.1 pg (ref 26.0–34.0)
MCHC: 34.3 g/dL (ref 30.0–36.0)
MCV: 87.9 fL (ref 80.0–100.0)
Platelets: 24 10*3/uL — CL (ref 150–400)
RBC: 2.82 MIL/uL — ABNORMAL LOW (ref 3.87–5.11)
RDW: 19.2 % — ABNORMAL HIGH (ref 11.5–15.5)
WBC: 12.3 10*3/uL — ABNORMAL HIGH (ref 4.0–10.5)
nRBC: 0 % (ref 0.0–0.2)

## 2021-11-20 LAB — GLUCOSE, CAPILLARY
Glucose-Capillary: 125 mg/dL — ABNORMAL HIGH (ref 70–99)
Glucose-Capillary: 143 mg/dL — ABNORMAL HIGH (ref 70–99)
Glucose-Capillary: 161 mg/dL — ABNORMAL HIGH (ref 70–99)
Glucose-Capillary: 60 mg/dL — ABNORMAL LOW (ref 70–99)
Glucose-Capillary: 64 mg/dL — ABNORMAL LOW (ref 70–99)
Glucose-Capillary: 98 mg/dL (ref 70–99)
Glucose-Capillary: 99 mg/dL (ref 70–99)
Glucose-Capillary: 99 mg/dL (ref 70–99)

## 2021-11-20 LAB — RENAL FUNCTION PANEL
Albumin: 2.6 g/dL — ABNORMAL LOW (ref 3.5–5.0)
Anion gap: 11 (ref 5–15)
BUN: 59 mg/dL — ABNORMAL HIGH (ref 8–23)
CO2: 24 mmol/L (ref 22–32)
Calcium: 8.1 mg/dL — ABNORMAL LOW (ref 8.9–10.3)
Chloride: 100 mmol/L (ref 98–111)
Creatinine, Ser: 3.79 mg/dL — ABNORMAL HIGH (ref 0.44–1.00)
GFR, Estimated: 12 mL/min — ABNORMAL LOW (ref >60)
Glucose, Bld: 68 mg/dL — ABNORMAL LOW (ref 70–99)
Phosphorus: 6.6 mg/dL — ABNORMAL HIGH (ref 2.5–4.6)
Potassium: 4.6 mmol/L (ref 3.5–5.1)
Sodium: 135 mmol/L (ref 135–145)

## 2021-11-20 LAB — APTT: aPTT: 80 seconds — ABNORMAL HIGH (ref 24–36)

## 2021-11-20 LAB — MAGNESIUM: Magnesium: 1.8 mg/dL (ref 1.7–2.4)

## 2021-11-20 MED ORDER — FENTANYL CITRATE (PF) 100 MCG/2ML IJ SOLN
INTRAMUSCULAR | Status: AC | PRN
  Administered 2021-11-20: 25 ug via INTRAVENOUS

## 2021-11-20 MED ORDER — HEPARIN SODIUM (PORCINE) 1000 UNIT/ML IJ SOLN
INTRAMUSCULAR | Status: AC
  Administered 2021-11-20: 15:00:00 3200 [IU] via ARTERIOVENOUS_FISTULA
  Filled 2021-11-20: qty 10

## 2021-11-20 MED ORDER — MIDAZOLAM HCL 2 MG/2ML IJ SOLN
INTRAMUSCULAR | Status: AC | PRN
  Administered 2021-11-20 (×2): .5 mg via INTRAVENOUS

## 2021-11-20 MED ORDER — FENTANYL CITRATE (PF) 100 MCG/2ML IJ SOLN
INTRAMUSCULAR | Status: AC
  Filled 2021-11-20: qty 2

## 2021-11-20 MED ORDER — PANTOPRAZOLE SODIUM 40 MG PO TBEC
40.0000 mg | DELAYED_RELEASE_TABLET | Freq: Two times a day (BID) | ORAL | Status: DC
  Administered 2021-11-20 – 2021-12-04 (×27): 40 mg via ORAL
  Filled 2021-11-20 (×27): qty 1

## 2021-11-20 MED ORDER — LIDOCAINE HCL 1 % IJ SOLN
INTRAMUSCULAR | Status: AC
  Administered 2021-11-20: 14:00:00 10 mL
  Filled 2021-11-20: qty 20

## 2021-11-20 MED ORDER — SUCRALFATE 1 GM/10ML PO SUSP
1.0000 g | Freq: Three times a day (TID) | ORAL | Status: DC
  Administered 2021-11-20 – 2021-11-27 (×19): 1 g
  Filled 2021-11-20 (×19): qty 10

## 2021-11-20 MED ORDER — GELATIN ABSORBABLE 12-7 MM EX MISC
CUTANEOUS | Status: AC
  Filled 2021-11-20: qty 1

## 2021-11-20 MED ORDER — ROMIPLOSTIM 250 MCG ~~LOC~~ SOLR
2.0000 ug/kg | Freq: Once | SUBCUTANEOUS | Status: AC
  Administered 2021-11-20: 10:00:00 145 ug via SUBCUTANEOUS
  Filled 2021-11-20: qty 0.29

## 2021-11-20 MED ORDER — MAGNESIUM SULFATE 2 GM/50ML IV SOLN
2.0000 g | Freq: Once | INTRAVENOUS | Status: AC
  Administered 2021-11-20: 12:00:00 2 g via INTRAVENOUS
  Filled 2021-11-20: qty 50

## 2021-11-20 MED ORDER — CEFAZOLIN SODIUM-DEXTROSE 2-4 GM/100ML-% IV SOLN
INTRAVENOUS | Status: AC
  Administered 2021-11-20: 14:00:00 2 g via INTRAVENOUS
  Filled 2021-11-20: qty 100

## 2021-11-20 MED ORDER — DULOXETINE HCL 20 MG PO CPEP
20.0000 mg | ORAL_CAPSULE | Freq: Every day | ORAL | Status: DC
  Administered 2021-11-20 – 2021-12-04 (×14): 20 mg via ORAL
  Filled 2021-11-20 (×16): qty 1

## 2021-11-20 MED ORDER — MIDAZOLAM HCL 2 MG/2ML IJ SOLN
INTRAMUSCULAR | Status: AC
  Filled 2021-11-20: qty 2

## 2021-11-20 MED ORDER — APIXABAN 5 MG PO TABS
5.0000 mg | ORAL_TABLET | Freq: Two times a day (BID) | ORAL | Status: DC
  Administered 2021-11-20 – 2021-12-04 (×27): 5 mg via ORAL
  Filled 2021-11-20 (×28): qty 1

## 2021-11-20 MED ORDER — ONDANSETRON HCL 4 MG PO TABS
4.0000 mg | ORAL_TABLET | Freq: Three times a day (TID) | ORAL | Status: AC
  Administered 2021-11-20 – 2021-11-21 (×4): 4 mg
  Filled 2021-11-20 (×4): qty 1

## 2021-11-20 MED ORDER — INSULIN DETEMIR 100 UNIT/ML ~~LOC~~ SOLN
8.0000 [IU] | Freq: Two times a day (BID) | SUBCUTANEOUS | Status: DC
  Administered 2021-11-21 – 2021-12-03 (×24): 8 [IU] via SUBCUTANEOUS
  Filled 2021-11-20 (×29): qty 0.08

## 2021-11-20 MED ORDER — INSULIN ASPART 100 UNIT/ML IJ SOLN
4.0000 [IU] | INTRAMUSCULAR | Status: DC
  Administered 2021-11-21 – 2021-12-01 (×25): 4 [IU] via SUBCUTANEOUS

## 2021-11-20 MED ORDER — CHLORHEXIDINE GLUCONATE CLOTH 2 % EX PADS: 6.0000 | MEDICATED_PAD | Freq: Every day | CUTANEOUS | Status: DC

## 2021-11-20 NOTE — Procedures (Signed)
Interventional Radiology Procedure Note  Procedure: Placement of a right IJ approach tunneled HD catheter.   .  Tip is positioned at the superior cavoatrial junction and catheter is ready for immediate use.   Complications: None Recommendations:  - Ok to restart [facility] when needed - Do not submerge - Routine line care   Signed,  Dulcy Fanny. Earleen Newport, DO

## 2021-11-20 NOTE — Progress Notes (Deleted)
Office Visit Note  Patient: [name]             Date of Birth: 1951-05-22           MRN: [id number]             PCP: [name], MD Referring: [name], MD Visit Date: 12/04/2021 Occupation: @GUAROCC @  Subjective:  No chief complaint on file.   History of Present Illness: [name] is a 71 y.o. female ***   Activities of Daily Living:  Patient reports morning stiffness for *** {minute/hour:19697}.   Patient {ACTIONS;DENIES/REPORTS:21021675::"Denies"} nocturnal pain.  Difficulty dressing/grooming: {ACTIONS;DENIES/REPORTS:21021675::"Denies"} Difficulty climbing stairs: {ACTIONS;DENIES/REPORTS:21021675::"Denies"} Difficulty getting out of chair: {ACTIONS;DENIES/REPORTS:21021675::"Denies"} Difficulty using hands for taps, buttons, cutlery, and/or writing: {ACTIONS;DENIES/REPORTS:21021675::"Denies"}  No Rheumatology ROS completed.   PMFS History:  Patient Active Problem List   Diagnosis Date Noted   Acute on chronic renal failure ([place]) 11/12/2021   Leukocytosis    Thrombocytopenia (HCC)    Aspiration pneumonia of both lower lobes (HCC)    Hemodialysis-associated hypotension    Acute respiratory failure ([place]) due to recurrent aspiration Pneumonia and mucous plugging/atelectasis     Acute saddle pulmonary embolism with acute cor pulmonale (HCC)    Protein-calorie malnutrition, severe 11/08/2021   Adjustment disorder with depressed mood 04/10/2021   History of infectious disease 04/10/2021   Affective psychosis ([place]) 11/21/2020   Allergic rhinitis due to pollen 11/21/2020   Anxiety 11/21/2020   Chronic kidney disease, stage 2 (mild) 11/21/2020   Diabetic renal disease ([place]) 11/21/2020   Diverticular disease of colon 11/21/2020   Eczema 11/21/2020   Gastroesophageal reflux disease 11/21/2020   Gout 11/21/2020   Joint pain 11/21/2020   Lymphedema 11/21/2020   Mild intermittent asthma 11/21/2020   Moderate major depression, single episode ([place])  11/21/2020   Neuropathy 11/21/2020   Type 2 diabetes mellitus with other specified complication ([place]) 28/36/6294   Pure hypercholesterolemia 11/21/2020   Vitamin D deficiency 11/21/2020   Fibromyalgia 09/04/2017   Insomnia 09/04/2017   DDD (degenerative disc disease), cervical 09/04/2017   DDD (degenerative disc disease), lumbar 09/04/2017   Primary osteoarthritis of both hands 09/04/2017   Primary osteoarthritis of both knees 09/04/2017   History of rotator cuff tear 09/04/2017   Osteopenia of multiple sites 09/04/2017   History of diabetes mellitus 09/04/2017   History of hypertension 09/04/2017   History of hypercholesterolemia 09/04/2017    Past Medical History:  Diagnosis Date   Anxiety    Arthritis    Asthma    Cardiac arrest ([place]) 10/28/2021   Cardiac arrest ([place]) 10/28/2021   Depression    Diabetes mellitus without complication (HCC)    Fibromyalgia    GERD (gastroesophageal reflux disease)    Hyperlipemia    Hypertension    PONV (postoperative nausea and vomiting)     Family History  Problem Relation Age of Onset   Diabetes Mother    Heart attack Father    Diabetes Sister    Throat cancer Brother    Hypertension Daughter    Past Surgical History:  Procedure Laterality Date   COLONOSCOPY     DILATION AND CURETTAGE OF UTERUS     IR EMBO ART  VEN HEMORR LYMPH EXTRAV  INC GUIDE ROADMAPPING  11/01/2021   IR THROMBECT PRIM MECH INIT (INCLU) MOD SED  10/29/2021   IR US GUIDE VASC ACCESS RIGHT  10/29/2021   IR US GUIDE VASC ACCESS RIGHT  11/01/2021   KNEE ARTHROSCOPY  [id number]  left   SHOULDER ARTHROSCOPY     left   TRIGGER FINGER RELEASE  08/13/2012   Procedure: RELEASE TRIGGER FINGER/A-1 PULLEY;  Surgeon: [name]., MD;  Location: [facility];  Service: Orthopedics;  Laterality: Right;  long finger   TUBAL LIGATION     Social History   Social History Narrative   Not on file   Immunization History  Administered Date(s) Administered   Tdap  02/26/2012     Objective: Vital Signs: There were no vitals taken for this visit.   Physical Exam   Musculoskeletal Exam: ***  CDAI Exam: CDAI Score: -- Patient Global: --; Provider Global: -- Swollen: --; Tender: -- Joint Exam 12/04/2021   No joint exam has been documented for this visit   There is currently no information documented on the homunculus. Go to the Rheumatology activity and complete the homunculus joint exam.  Investigation: No additional findings.  Imaging: CT ABDOMEN PELVIS WO CONTRAST  Result Date: 10/30/2021 CLINICAL DATA:  Retroperitoneal hemorrhage. EXAM: CT ABDOMEN AND PELVIS WITHOUT CONTRAST TECHNIQUE: Multidetector CT imaging of the abdomen and pelvis was performed following the standard protocol without IV contrast. COMPARISON:  October 28, 2021. FINDINGS: Lower chest: Large bilateral lower lobe airspace opacities are noted concerning for pneumonia with small adjacent pleural effusions. Hepatobiliary: Contrast is noted in the gallbladder. No biliary dilatation is noted. There appears to be hemorrhage within the left hepatic lobe which extends into large adjacent left subscapular hematoma or perihepatic hematoma. High-density fluid is noted around the liver concerning for hemorrhage. Pancreas: Unremarkable. No pancreatic ductal dilatation or surrounding inflammatory changes. Spleen: Normal in size without focal abnormality. Adrenals/Urinary Tract: Adrenal glands and kidneys are unremarkable. No hydronephrosis or renal obstruction is noted. Urinary bladder is decompressed secondary to Foley catheter. Stomach/Bowel: Nasogastric tube is seen looped within proximal stomach. The appendix appears normal. There is no evidence of bowel obstruction or inflammation. Vascular/Lymphatic: Aortic atherosclerosis. No enlarged abdominal or pelvic lymph nodes. Reproductive: Uterus and bilateral adnexa are unremarkable. Other: There is interval development of a large amount of high  density fluid in the pelvis consistent with intraperitoneal hemorrhage. It is seen in bilateral pericolic gutters and noted around the spleen and pancreas. Musculoskeletal: No acute or significant osseous findings. IMPRESSION: There is interval development of large intraperitoneal hemorrhage seen in the pelvis which extends into both pericolic gutters and around the liver and spleen. Also noted is interval development intraparenchymal hemorrhage within the left hepatic lobe which extends into large left subcapsular hematoma. Large bilateral lower lobe airspace opacities are noted most consistent with pneumonia with small adjacent pleural effusions. Critical Value/emergent results were called by telephone at the time of interpretation on 10/30/2021 at 9:20 pm to provider Dr. Lucile Shutters, who verbally acknowledged these results. Aortic Atherosclerosis (ICD10-I70.0). Electronically Signed   By: Marijo Conception M.D.   On: 10/30/2021 21:21   DG Chest 1 View  Result Date: 11/01/2021 CLINICAL DATA:  CPR.  Innovation. EXAM: CHEST  1 VIEW COMPARISON:  10/29/2021 FINDINGS: 0740 hours. Endotracheal tube tip is approximately 2.7 cm above the base of the carina. NG tube tip overlies the gastric fundus. Right IJ central line tip is positioned in the low right atrium. Low lung volumes with bibasilar atelectasis and left-sided pleural effusion. Telemetry leads overlie the chest. IMPRESSION: 1. Endotracheal tube tip is approximately 2.7 cm above the base of the carina. 2. Low volume film with bibasilar atelectasis and left pleural effusion. Electronically Signed   By: Randall Hiss  Tery Sanfilippo M.D.   On: 11/01/2021 07:55   CT HEAD WO CONTRAST (5MM)  Result Date: 11/08/2021 CLINICAL DATA:  Delirium. EXAM: CT HEAD WITHOUT CONTRAST TECHNIQUE: Contiguous axial images were obtained from the base of the skull through the vertex without intravenous contrast. RADIATION DOSE REDUCTION: This exam was performed according to the departmental  dose-optimization program which includes automated exposure control, adjustment of the mA and/or kV according to patient size and/or use of iterative reconstruction technique. COMPARISON:  Head CT 11/04/2021 FINDINGS: Brain: Mild motion artifact limitations. No intracranial hemorrhage, mass effect, or midline shift. No hydrocephalus. The basilar cisterns are patent. For with differentiation is preserved. No evidence of territorial infarct or acute ischemia. No extra-axial or intracranial fluid collection. Vascular: Atherosclerosis of skullbase vasculature without hyperdense vessel or abnormal calcification. Skull: No fracture or focal lesion. Sinuses/Orbits: Paranasal sinuses and mastoid air cells are clear. The visualized orbits are unremarkable. Other: None. IMPRESSION: No acute intracranial abnormality, allowing for motion artifact limitations. Electronically Signed   By: Keith Rake M.D.   On: 11/08/2021 17:59   CT HEAD WO CONTRAST (5MM)  Result Date: 11/04/2021 CLINICAL DATA:  Stroke, hemorrhagic Additional history: Patient is status post cardiac arrest. EXAM: CT HEAD WITHOUT CONTRAST TECHNIQUE: Contiguous axial images were obtained from the base of the skull through the vertex without intravenous contrast. COMPARISON:  CT head 10/28/2021. FINDINGS: Motion limited study. Brain: No evidence of acute large vascular territory infarction, hemorrhage, hydrocephalus, extra-axial collection or mass lesion/mass effect. Vascular: No hyperdense vessel identified. Skull: No acute fracture. Sinuses/Orbits: Clear sinuses.  Unremarkable orbits. Other: No mastoid effusions IMPRESSION: No evidence of acute intracranial abnormality on this motion limited study. MRI could provide more sensitive evaluation for hypoxic/ischemic injury if clinically indicated. Electronically Signed   By: Margaretha Sheffield M.D.   On: 11/04/2021 14:40   CT HEAD WO CONTRAST (5MM)  Result Date: 10/28/2021 CLINICAL DATA:  Mental status  change, unknown cause EXAM: CT HEAD WITHOUT CONTRAST TECHNIQUE: Contiguous axial images were obtained from the base of the skull through the vertex without intravenous contrast. COMPARISON:  None. FINDINGS: Brain: No evidence of large-territorial acute infarction. No parenchymal hemorrhage. No mass lesion. No extra-axial collection. No mass effect or midline shift. No hydrocephalus. Basilar cisterns are patent. Vascular: No hyperdense vessel. Skull: No acute fracture or focal lesion. Sinuses/Orbits: Mucosal thickening within bilateral nasal cavities. Paranasal sinuses and mastoid air cells are clear. The orbits are unremarkable. Other: None. IMPRESSION: No acute intracranial abnormality. Electronically Signed   By: Iven Finn M.D.   On: 10/28/2021 17:31   CT Angio Chest Pulmonary Embolism (PE) W or WO Contrast  Result Date: 10/28/2021 CLINICAL DATA:  Pulmonary embolism (PE) suspected, high prob echo with RV strain EXAM: CT ANGIOGRAPHY CHEST WITH CONTRAST TECHNIQUE: Multidetector CT imaging of the chest was performed using the standard protocol during bolus administration of intravenous contrast. Multiplanar CT image reconstructions and MIPs were obtained to evaluate the vascular anatomy. CONTRAST:  8mL OMNIPAQUE IOHEXOL 350 MG/ML SOLN COMPARISON:  None. FINDINGS: Lines and tubes: Right mainstem bronchus intubation with tip terminating just distal to the carina. Enteric tube coursing below the hemidiaphragm with tip within the gastric lumen. There is a 5.2 cm density within the right lower neck that is partially visualized that may be related to the insertion site of a right internal jugular central venous catheter with tip terminating in the right atrium. Cardiovascular: Satisfactory opacification of the pulmonary arteries to the segmental level. Saddle pulmonary embolus extending to the segmental  and subsegmental levels in the left lower lobe in all three right lobes. Associated increased right to left  ventricular ratio 1.9. No significant pericardial effusion. The thoracic aorta is normal in caliber. No atherosclerotic plaque of the thoracic aorta. No coronary artery calcifications. Mediastinum/Nodes: No definite pneumomediastinum. No enlarged mediastinal, hilar, or axillary lymph nodes. Thyroid gland, trachea, and esophagus demonstrate no significant findings. Lungs/Pleura: Bilateral lower lobe subsegmental atelectasis. Lingular subsegmental atelectasis. No pulmonary nodule. No pulmonary mass. No pleural effusion. No pneumothorax. Upper Abdomen: Query partially visualized right upper mid abdomen fat stranding. Musculoskeletal: No suspicious lytic or blastic osseous lesions. Acute minimally displaced left anterior fourth and fifth rib fractures. Acute nondisplaced mid sternal body fracture. Likely old nondisplaced left posterior rib fractures. Multilevel degenerative changes of the spine. Review of the MIP images confirms the above findings. IMPRESSION: 1. Saddle pulmonary embolus extending to the segmental and subsegmental levels in the left lower lobe and in all three right lobes. Associated right heart strain. No definite pulmonary infarction. Positive for acute PE with CT evidence of right heart strain (RV/LV Ratio = 1.9) consistent with at least submassive (intermediate risk) PE. The presence of right heart strain has been associated with an increased risk of morbidity and mortality. Please refer to the "PE Focused" order set in EPIC. 2. Right mainstem bronchus intubation with tip terminating just distal to the carina. Recommend retracting by 3 cm. 3. A 5.2 cm density within the right lower neck that is partially visualized that may be related to a hematoma along the insertion site of a right internal jugular central venous catheter with tip terminating in the right atrium. Limited evaluation due to timing of contrast. 4. Acute minimally displaced left anterior 4th and 5th rib fractures. 5.  Acute  nondisplaced mid sternal body fracture. 6. Query partially visualized right upper mid abdomen fat stranding. Concern for underlying mass or inflammation. Recommend CT abdomen pelvis with contrast for further evaluation. 7. Bilateral lower lobe and lingular consolidation may represent atelectasis with superimposed infection/inflammation (such as aspiration pneumonia) not excluded. These results were called by telephone at the time of interpretation on 10/28/2021 at 5:17 pm to provider Dr. Ernest Mallick , who verbally acknowledged these results. Electronically Signed   By: Iven Finn M.D.   On: 10/28/2021 17:30   CT ABDOMEN PELVIS W CONTRAST  Result Date: 10/28/2021 CLINICAL DATA:  Anemia. EXAM: CT ABDOMEN AND PELVIS WITH CONTRAST TECHNIQUE: Multidetector CT imaging of the abdomen and pelvis was performed using the standard protocol following bolus administration of intravenous contrast. CONTRAST:  135mL OMNIPAQUE IOHEXOL 300 MG/ML  SOLN COMPARISON:  CT scan of same day. FINDINGS: Lower chest: Left upper lobe atelectasis or pneumonia is noted. Mild posterior basilar subsegmental atelectasis is noted. Pulmonary embolus is noted in right pulmonary artery as noted on prior CT scan of same day. Hepatobiliary: No gallstones or biliary dilatation is noted. Large ill-defined low density is noted in left hepatic lobe measuring 3.7 x 2.4 cm. Mild amount of fluid is noted in the gallbladder fossa and in Morison's pouch concerning for possible cholecystitis. Pancreas: Unremarkable. No pancreatic ductal dilatation or surrounding inflammatory changes. Spleen: Normal in size without focal abnormality. Adrenals/Urinary Tract: Adrenal glands and kidneys appear normal. Hydronephrosis or renal obstruction is noted. Urinary bladder is decompressed secondary to Foley catheter. Stomach/Bowel: Stomach and appendix are unremarkable. There is no evidence of bowel obstruction. Colon is unremarkable. Fluid-filled small bowel loops are  noted with minimal wall and fold thickening and enhancement suggesting mild  enteritis. Vascular/Lymphatic: Aortic atherosclerosis. No enlarged abdominal or pelvic lymph nodes. Reproductive: Uterus and bilateral adnexa are unremarkable. Other: No abdominal wall hernia or abnormality. No abdominopelvic ascites. Musculoskeletal: No acute or significant osseous findings. IMPRESSION: Mild amount of fluid is noted in the gallbladder fossa and in Morrison's pouch which may represent cholecystitis. No cholelithiasis is noted. Ultrasound is recommended for further evaluation. Ill-defined 3.7 x 2.4 cm low density is noted in the left hepatic lobe of uncertain etiology. When the patient is clinically stable and able to follow directions and hold their breath (preferably as an outpatient) further evaluation with dedicated abdominal MRI should be considered. Fluid-filled small bowel loops are noted with minimal wall and fold thickening and enhancement suggesting enteritis. Aortic Atherosclerosis (ICD10-I70.0). Electronically Signed   By: Marijo Conception M.D.   On: 10/28/2021 18:46   MR LIVER WO CONRTAST  Result Date: 11/09/2021 CLINICAL DATA:  71 year old female with history of intraperitoneal hemorrhage. Additional intraparenchymal hemorrhage noted within the liver. Evaluate for underlying hepatocellular carcinoma or other hepatic mass. EXAM: MRI ABDOMEN WITHOUT CONTRAST TECHNIQUE: Multiplanar multisequence MR imaging was performed without the administration of intravenous contrast. COMPARISON:  No prior abdominal MRI. CT of the abdomen and pelvis 10/31/2011 scratch the CT the abdomen and pelvis 10/30/2021. FINDINGS: Comment: Today's study is limited for detection and characterization of visceral and/or vascular lesions by lack of IV gadolinium. Additionally, extensive patient motion severely limits today's examination. Lower chest: High signal intensity in the lower thorax dependently, likely a combination of layering  pleural effusions and extensive airspace consolidation in the dependent portions of the lungs, poorly evaluated by MRI. Allowing for differences between modalities, findings appear very similar to prior CT of the abdomen and pelvis 10/30/2021. Hepatobiliary: In the left lobe of the liver centered predominantly in segment 3 there is a expansile area within the parenchyma which is very heterogeneous in signal intensity on T1 and T2 weighted images, including some dependent areas of T1 hyperintensity. The remainder of the liver is otherwise normal in appearance, without additional suspicious cystic or solid hepatic lesions. No intra or extrahepatic biliary ductal dilatation. Gallbladder is moderately distended, also demonstrating heterogeneous signal intensity. Gallbladder wall appears mildly irregular, and there is some fluid adjacent to the gallbladder posteriorly, but no circumferential pericholecystic fluid to clearly indicate an acute cholecystitis at this time. Pancreas: No definite pancreatic mass or peripancreatic fluid collections or inflammatory changes noted on today's noncontrast examination. Spleen:  Unremarkable. Adrenals/Urinary Tract: Unenhanced appearance of the kidneys and bilateral adrenal glands is normal. No hydroureteronephrosis in the visualized portions of the abdomen. Stomach/Bowel: Visualized portions are unremarkable. Vascular/Lymphatic: No aneurysm identified in the visualized abdominal vasculature. No lymphadenopathy noted in the abdomen. Other: Moderate volume of free intraperitoneal fluid, best visualized in the pericolic gutters (right greater than left). This is slightly T1 hyperintense and T2 iso to slightly hyperintense, compatible with indwelling blood products. Musculoskeletal: No aggressive appearing osseous lesions are noted in the visualized portions of the skeleton. IMPRESSION: 1. Findings appear very similar to the recent CT examination. Unfortunately, without IV gadolinium,  the large intraparenchymal hepatic lesion centered predominantly in segment 3 of the liver cannot be characterized. This is once again favored to represent a large intrahepatic hemorrhage, although underlying mass lesion is difficult to exclude. If there is elevated AFP level or other clinical concern for hepatic malignancy, repeat abdominal MRI with and without IV gadolinium would be recommended, preferably after resolution of the patient's acute illness to allow for proper patient breath-holding,  to better evaluate these findings and exclude underlying malignancy. 2. Distended gallbladder which demonstrates heterogeneous signal intensity, likely in part related to vicarious excretion of iodinated contrast material, but also potentially related to the presence of blood products. Gallbladder is slightly more distended than prior CT examination. If there is clinical concern for cholecystitis, further evaluation with nuclear medicine hepatobiliary scan should be considered. 3. Intraperitoneal hemorrhage redemonstrated. 4. Severe bilateral lower lobe pneumonia and trace parapneumonic pleural effusions. Given the distribution, this likely represent severe aspiration pneumonia. Electronically Signed   By: Vinnie Langton M.D.   On: 11/09/2021 05:33   MR LIVER W WO CONTRAST  Result Date: 11/16/2021 CLINICAL DATA:  Intrahepatic hematoma on recent CT and MR. Patient underwent left hepatic artery angiogram a Gel-Foam embolization on 11/01/2021. EXAM: MRI ABDOMEN WITHOUT AND WITH CONTRAST TECHNIQUE: Multiplanar multisequence MR imaging of the abdomen was performed both before and after the administration of intravenous contrast. CONTRAST:  67mL MULTIHANCE GADOBENATE DIMEGLUMINE 529 MG/ML IV SOLN COMPARISON:  MRI 11/08/2021.  CT scan 10/30/2021. FINDINGS: Note: Fine anatomic detail is obscured by motion artifact on today's study as is contrast resolution. Lower chest: Left lower lobe collapse/consolidation. Expansion in  the posterior right lower lobe has improved in the interval. Hepatobiliary: As before, there is a large 9.5 x 10.5 x 6.9 cm heterogeneous lesion/collection in the inferior aspect of the lateral segment left liver. This lesion was measured at 8.5 x 10.0 x 7.0 cm previously. After IV contrast administration, there is some peripheral enhancement around the lesion. The fluid fluid level seen on the previous exam is less prominent today. Coronal imaging shows irregular thickened peripheral enhancement, most prominent along the superior margin (see coronal postcontrast image 91 of series 11). Gallbladder is markedly distended with multiple gallstones evident. Stones are noted in the neck of the gallbladder (coronal 15/3 and axial 27/4). No intrahepatic or extrahepatic biliary dilation. Pancreas: No focal mass lesion. No dilatation of the main duct. No intraparenchymal cyst. No peripancreatic edema. Fine detail of pancreatic anatomy obscured by motion artifact. Spleen: No gross abnormality in the spleen with fine detail obscured by motion artifact. Adrenals/Urinary Tract: No adrenal nodule or mass. Postcontrast imaging of the kidneys is severely limited by motion artifact. Pre contrast T2 imaging shows areas of apparent segmental edema in both kidneys. No hydronephrosis. Stomach/Bowel: Stomach is unremarkable. No gastric wall thickening. No evidence of outlet obstruction. Duodenum is normally positioned as is the ligament of Treitz. No small bowel or colonic dilatation within the visualized abdomen. Vascular/Lymphatic: No abdominal aortic aneurysm. No abdominal lymphadenopathy. Other:  No intraperitoneal free fluid. Musculoskeletal: No focal suspicious marrow enhancement within the visualized bony anatomy. IMPRESSION: 1. Limited study due to motion artifact. As before, a large lesion in the inferior aspect of the lateral segment left liver is again noted, stable to minimally increased in the interval. The fluid fluid level  seen on the previous study is less prominent today. Peripheral enhancement noted around the lesion with irregular enhancing margins most prominent superiorly. While the bulk of this lesion does not enhance centrally, consistent with hematoma, given the irregular peripheral enhancement along the cranial margin, hemorrhagic neoplasm cannot be excluded. 2. Markedly distended gallbladder with gallstones. No evidence for choledocholithiasis. No intrahepatic or extrahepatic biliary dilation. 3. Areas of segmental edema in both kidneys. Pyelonephritis not excluded. 4. Left greater than right lower lobe collapse/consolidation. 5. Interval decrease in intraperitoneal free fluid Electronically Signed   By: Misty Maria M.D.   On: 11/16/2021 10:27  IR THROMBECT PRIM MECH INIT (INCLU) MOD SED  Result Date: 10/29/2021 INDICATION: High-risk pulmonary embolism with hemodynamic instability requiring pressor support, bilateral pulmonary embolism with saddle component EXAM: 1. Ultrasound-guided puncture of the right common femoral vein 2. Catheterization of the main and right pulmonary arteries with bilateral pulmonary artery angiogram 3. Mechanical/aspiration thrombectomy of the right and left pulmonary arteries COMPARISON:  CTA chest previous day MEDICATIONS: None. ANESTHESIA/SEDATION: Sedation provided by the critical care team. Refer to separate critical care records. FLUOROSCOPY TIME:  FLUOROSCOPY TIME Fluoroscopy Time: 12.3 minutes with 4 exposures COMPLICATIONS: None immediate. TECHNIQUE: Informed written consent was obtained from the patient after a thorough discussion of the procedural risks, benefits and alternatives. All questions were addressed. Maximal Sterile Barrier Technique was utilized including caps, mask, sterile gowns, sterile gloves, sterile drape, hand hygiene and skin antiseptic. A timeout was performed prior to the initiation of the procedure. The patient was placed supine on the exam table. Ultrasound  of the right groin was performed. This demonstrated a widely patent right common femoral vein. A permanent image was stored in the electronic medical record. Using ultrasound guidance, the right common femoral vein was directly punctured with a 21 gauge micropuncture set with visualization of needle entry into the vessel lumen. Wire was advanced centrally into the IVC, followed by serial tract dilation to accommodate a short 6 [place] sheath. Initial attempts to catheterize the main pulmonary artery using combination of an Omni Flush catheter and Rosen guidewire were unsuccessful. The sheath was exchanged for a long 7 [place] sheath, and the pulmonary artery was catheterized using a combination of a single angled pigtail catheter and Rosen guidewire. Wire and catheter were directed into the right main pulmonary artery. Wire was exchanged for a short tapered stiff Amplatz wire. Over this wire, the access site was serially dilated to accommodate a 24 [place] sheath. Next, the Inari 24 [place] FlowTriever aspiration thrombectomy catheter was then advanced into the distal aspect of the right main pulmonary artery. Aspiration thrombectomy of the right main pulmonary artery was performed. Large volume of acute appearing clot was removed. The aspiration thrombectomy catheter was slowly withdrawn into the main pulmonary artery/RVOT, and thrombectomy of the saddle component and left pulmonary artery were performed, with removal of additional acute appearing thrombus. With the aspiration thrombectomy catheter positioned within the main pulmonary artery, bilateral pulmonary artery angiogram was performed. Bilateral pulmonary artery angiogram demonstrates complete resolution of the saddle pulmonary embolism in clot burden within the main pulmonary arteries. There remains scattered residual small volume subsegmental embolism. The patient's clinical parameters had improved with reduction in vasopressor requirement and improvement in  blood pressure at the end of the procedure. Given this improvement and no further large volume embolism identified on completion angiography, the procedure was stopped at this point. All catheters and wires were removed. Hemostasis was achieved at the access site using a pursestring suture. A clean dressing was placed. The patient tolerated the procedure well without immediate complication. FINDINGS: As above. IMPRESSION: Successful mechanical/aspiration thrombectomy of high-risk bilateral pulmonary embolism using the Inari FlowTriever aspiration thrombectomy catheter. Completion angiography demonstrated near-complete resolution of the saddle component and clot burden within the main pulmonary arteries. Electronically Signed   By: Albin Felling M.D.   On: 10/29/2021 16:33   IR US Guide Vasc Access Right  Result Date: 11/01/2021 INDICATION: 71 year old woman with acute anemia, hypotension, tachycardia due to large volume intraperitoneal hemorrhage originating from left liver lobe lesion presents to IR for angiogram and embolization. EXAM: 1.  Ultrasound-guided access of right common femoral artery 2. Celiac angiogram 3. Common hepatic angiogram 4. Left hepatic angiogram and Gel-Foam embolization 5. Angio-Seal closure of right common femoral artery access MEDICATIONS: None ANESTHESIA/SEDATION: None CONTRAST:  42 mL Intra-arterial Omnipaque 300 FLUOROSCOPY TIME:  Fluoroscopy Time: 7 minutes 42 seconds (466 mGy). COMPLICATIONS: None immediate. PROCEDURE: Informed consent was obtained from the patient's daughter's following explanation of the procedure, risks, benefits and alternatives. The patient's family understands, agrees and consents for the procedure. All questions were addressed. A time out was performed prior to the initiation of the procedure. Maximal barrier sterile technique utilized including caps, mask, sterile gowns, sterile gloves, large sterile drape, hand hygiene, and chlorhexidine prep. Patient  positioned supine on the angiography table. The right groin skin prepped and draped in usual fashion. Ultrasound image documenting patency of the right common femoral artery was obtained and placed in permanent medical record. Sterile ultrasound probe cover and gel utilized throughout the procedure. Using continuous ultrasound guidance, a 21 gauge needle was used to access the right common femoral artery at the level of the femoral head. 21 gauge needle exchanged for a transitional dilator set over 0.018 inch guidewire. Transitional dilator set exchanged for 5 French sheath over 0.035 inch guidewire. Celiac artery accessed with sauce omni catheter. Celiac angiogram showed patent left gastric, splenic, and common hepatic arteries. Microcatheter advanced into the common hepatic artery and angiogram was performed. The gastro duodenal and right hepatic arteries are patent. There is acute cut off of the left hepatic artery likely due to spasm related to hemorrhage in the left hepatic lobe. Microcatheter was advanced into the left hepatic artery an angiogram was performed which again showed the acute cut off of the left hepatic artery. No active extravasation was identified. Given that the CT showed hemorrhage within the left liver lobe, embolization of the left hepatic artery was performed with Gel-Foam slurry until minimal antegrade flow was seen. Post embolization angiogram showed little flow into the left hepatic lobe and reflux into the right hepatic lobe artery. Sheath angiogram showed appropriate puncture of the common femoral artery at the level the femoral head. The groin was re-prepped and draped and closure was performed with 6 [place] Angio-Seal device. IMPRESSION: Successful embolization of the left hepatic artery with Gel-Foam slurry. Electronically Signed   By: Miachel Roux M.D.   On: 11/01/2021 14:52   IR US Guide Vasc Access Right  Result Date: 10/29/2021 INDICATION: High-risk pulmonary embolism with  hemodynamic instability requiring pressor support, bilateral pulmonary embolism with saddle component EXAM: 1. Ultrasound-guided puncture of the right common femoral vein 2. Catheterization of the main and right pulmonary arteries with bilateral pulmonary artery angiogram 3. Mechanical/aspiration thrombectomy of the right and left pulmonary arteries COMPARISON:  CTA chest previous day MEDICATIONS: None. ANESTHESIA/SEDATION: Sedation provided by the critical care team. Refer to separate critical care records. FLUOROSCOPY TIME:  FLUOROSCOPY TIME Fluoroscopy Time: 12.3 minutes with 4 exposures COMPLICATIONS: None immediate. TECHNIQUE: Informed written consent was obtained from the patient after a thorough discussion of the procedural risks, benefits and alternatives. All questions were addressed. Maximal Sterile Barrier Technique was utilized including caps, mask, sterile gowns, sterile gloves, sterile drape, hand hygiene and skin antiseptic. A timeout was performed prior to the initiation of the procedure. The patient was placed supine on the exam table. Ultrasound of the right groin was performed. This demonstrated a widely patent right common femoral vein. A permanent image was stored in the electronic medical record. Using ultrasound guidance, the  right common femoral vein was directly punctured with a 21 gauge micropuncture set with visualization of needle entry into the vessel lumen. Wire was advanced centrally into the IVC, followed by serial tract dilation to accommodate a short 6 [place] sheath. Initial attempts to catheterize the main pulmonary artery using combination of an Omni Flush catheter and Rosen guidewire were unsuccessful. The sheath was exchanged for a long 7 [place] sheath, and the pulmonary artery was catheterized using a combination of a single angled pigtail catheter and Rosen guidewire. Wire and catheter were directed into the right main pulmonary artery. Wire was exchanged for a short tapered  stiff Amplatz wire. Over this wire, the access site was serially dilated to accommodate a 24 [place] sheath. Next, the Inari 24 [place] FlowTriever aspiration thrombectomy catheter was then advanced into the distal aspect of the right main pulmonary artery. Aspiration thrombectomy of the right main pulmonary artery was performed. Large volume of acute appearing clot was removed. The aspiration thrombectomy catheter was slowly withdrawn into the main pulmonary artery/RVOT, and thrombectomy of the saddle component and left pulmonary artery were performed, with removal of additional acute appearing thrombus. With the aspiration thrombectomy catheter positioned within the main pulmonary artery, bilateral pulmonary artery angiogram was performed. Bilateral pulmonary artery angiogram demonstrates complete resolution of the saddle pulmonary embolism in clot burden within the main pulmonary arteries. There remains scattered residual small volume subsegmental embolism. The patient's clinical parameters had improved with reduction in vasopressor requirement and improvement in blood pressure at the end of the procedure. Given this improvement and no further large volume embolism identified on completion angiography, the procedure was stopped at this point. All catheters and wires were removed. Hemostasis was achieved at the access site using a pursestring suture. A clean dressing was placed. The patient tolerated the procedure well without immediate complication. FINDINGS: As above. IMPRESSION: Successful mechanical/aspiration thrombectomy of high-risk bilateral pulmonary embolism using the Inari FlowTriever aspiration thrombectomy catheter. Completion angiography demonstrated near-complete resolution of the saddle component and clot burden within the main pulmonary arteries. Electronically Signed   By: Albin Felling M.D.   On: 10/29/2021 16:33   DG Chest Port 1 View  Result Date: 11/14/2021 CLINICAL DATA:  Pneumonia,  history of cardiac arrest, weakness EXAM: PORTABLE CHEST 1 VIEW COMPARISON:  11/12/2021 FINDINGS: The heart size and mediastinal contours are within normal limits. Right neck vascular catheter, tip projecting over the right atrium. Unchanged elevation of the right hemidiaphragm with associated scarring or atelectasis. The visualized skeletal structures are unremarkable. IMPRESSION: Unchanged elevation of the right hemidiaphragm with associated scarring or atelectasis. No new airspace opacity. Electronically Signed   By: Delanna Ahmadi M.D.   On: 11/14/2021 09:15   DG Chest Port 1 View  Result Date: 11/12/2021 CLINICAL DATA:  Cardiac arrest, acute respiratory failure. EXAM: PORTABLE CHEST 1 VIEW COMPARISON:  11/10/2021 and CT chest 10/28/2021. FINDINGS: Patient is rotated. Trachea is midline. Right IJ central line tip is in the right atrium. Heart size is stable. Lungs are low in volume with left lower lobe airspace opacification. Small left pleural effusion. Right hemidiaphragm is elevated. IMPRESSION: Persistent left lower lobe airspace opacification, possibly due to pneumonia. Associated small left pleural effusion. Electronically Signed   By: Lorin Picket M.D.   On: 11/12/2021 08:47   DG CHEST PORT 1 VIEW  Result Date: 11/10/2021 CLINICAL DATA:  Hypoxia EXAM: PORTABLE CHEST 1 VIEW COMPARISON:  11/01/2021 FINDINGS: Single frontal view of the chest demonstrates interval removal of the endotracheal and  enteric catheters. Right internal jugular catheter unchanged. Cardiac silhouette is stable. Chronic elevation of the right hemidiaphragm. Improved aeration at the lung bases, with minimal residual atelectasis. Mild central vascular congestion. No effusion or pneumothorax. IMPRESSION: 1. Low lung volumes, with improved aeration at the lung bases. 2. Central vascular congestion without overt edema. Electronically Signed   By: Randa Ngo M.D.   On: 11/10/2021 22:09   DG Chest Port 1 View  Result Date:  10/29/2021 CLINICAL DATA:  Respiratory failure. EXAM: PORTABLE CHEST 1 VIEW COMPARISON:  10/28/2021. FINDINGS: ET tube tip is above the carina. Nasogastric tube tip and side port are in the gastric fundus. Right IJ catheter tip is in the right atrium. Stable cardiomediastinal contours. Asymmetric opacification within the left midlung and left lower lung appears increased from the previous exam. Right lung remains clear. IMPRESSION: 1. Worsening aeration to the left midlung and left lower lung. 2. Stable support apparatus. Electronically Signed   By: Kerby Moors M.D.   On: 10/29/2021 07:07   DG CHEST PORT 1 VIEW  Result Date: 10/28/2021 CLINICAL DATA:  Post CPR.  Central line placement. EXAM: PORTABLE CHEST 1 VIEW COMPARISON:  10/28/2021 at 1:15 p.m. FINDINGS: Right internal jugular central venous line has its tip in the right atrium. No pneumothorax. No other change from the earlier study. IMPRESSION: 1. Right IJ central venous line has its tip in the right atrium. No pneumothorax. Electronically Signed   By: Lajean Manes M.D.   On: 10/28/2021 14:52   DG Chest Portable 1 View  Result Date: 10/28/2021 CLINICAL DATA:  Status post cardiac arrest and intubation. EXAM: PORTABLE CHEST 1 VIEW COMPARISON:  10/23/2020. FINDINGS: Endotracheal tube tip projects 1 cm above the carinal. Nasal/orogastric tube passes well below the diaphragm into the stomach. Cardiac silhouette normal in size. Normal mediastinal and hilar contours. Clear lungs. IMPRESSION: 1. Well-positioned endotracheal tube and nasal/orogastric tube. 2. No acute cardiopulmonary disease. Electronically Signed   By: Lajean Manes M.D.   On: 10/28/2021 13:39   DG Ankle Right Port  Result Date: 11/19/2021 CLINICAL DATA:  Trauma, pain EXAM: PORTABLE RIGHT ANKLE - 2 VIEW COMPARISON:  None. FINDINGS: No recent fracture or dislocation is seen. Small bony spurs are seen at the tip medial malleolus. Plantar spur is seen in calcaneus. Small bony spurs seen in  the dorsal aspect of intertarsal joints. IMPRESSION: No recent fracture or dislocation is seen in the right ankle. There is moderate sized plantar spur in calcaneus. There are bony spurs at the tip of medial malleolus and dorsal aspect of intertarsal joints, suggesting degenerative arthritis. Electronically Signed   By: Elmer Picker M.D.   On: 11/19/2021 14:48   DG Abd Portable 1V  Result Date: 11/16/2021 CLINICAL DATA:  Feeding tube placement. EXAM: PORTABLE ABDOMEN - 1 VIEW COMPARISON:  Abdominal x-ray 10/28/2021. FINDINGS: Enteric tube tip is at the level of the distal stomach. No dilated bowel loops are seen. Hyperdensity is seen within diverticula in the right colon. IMPRESSION: 1. Enteric tube tip at the level of the distal stomach. Electronically Signed   By: Ronney Asters M.D.   On: 11/16/2021 16:30   DG Abd Portable 1 View  Result Date: 10/28/2021 CLINICAL DATA:  Nasogastric tube placement. EXAM: PORTABLE ABDOMEN - 1 VIEW COMPARISON:  None. FINDINGS: Nasogastric tube extends well below diaphragm to curl within the to stomach in the left upper quadrant. IMPRESSION: Well-positioned nasogastric tube. Electronically Signed   By: Lajean Manes M.D.   On: 10/28/2021  13:39   DG Swallowing Func-Speech Pathology  Result Date: 11/14/2021 Table formatting from the original result was not included. Objective Swallowing Evaluation: Type of Study: MBS-Modified Barium Swallow Study  Patient Details Name: [name] MRN: [id number] Date of Birth: 12-Apr-1951 Today's Date: 11/14/2021 Time: SLP Start Time (ACUTE ONLY): 0850 -SLP Stop Time (ACUTE ONLY): 0910 SLP Time Calculation (min) (ACUTE ONLY): 20 min Past Medical History: Past Medical History: Diagnosis Date  Anxiety   Arthritis   Asthma   Cardiac arrest ([place]) 10/28/2021  Cardiac arrest ([place]) 10/28/2021  Depression   Diabetes mellitus without complication (HCC)   Fibromyalgia   GERD (gastroesophageal reflux disease)   Hyperlipemia   Hypertension   PONV  (postoperative nausea and vomiting)  Past Surgical History: Past Surgical History: Procedure Laterality Date  COLONOSCOPY    DILATION AND CURETTAGE OF UTERUS    IR EMBO ART  VEN HEMORR LYMPH EXTRAV  INC GUIDE ROADMAPPING  11/01/2021  IR THROMBECT PRIM MECH INIT (INCLU) MOD SED  10/29/2021  IR US GUIDE VASC ACCESS RIGHT  10/29/2021  IR US GUIDE VASC ACCESS RIGHT  11/01/2021  KNEE ARTHROSCOPY  2003,2005  left  SHOULDER ARTHROSCOPY    left  TRIGGER FINGER RELEASE  08/13/2012  Procedure: RELEASE TRIGGER FINGER/A-1 PULLEY;  Surgeon: [name]., MD;  Location: [place];  Service: Orthopedics;  Laterality: Right;  long finger  TUBAL LIGATION   HPI: Pt is a 71 yo female presenting 1/1 after prolonged OOH VF arrest (20 min of CPR, also coded 2x in ED, 4 min and 3 min) secondary to massive PE complicated by DIC. She is s/p thrombectomy on 1/2. Hospital course further complicated by AKI requiring CRRT, hemorrhagic shock due to hepatic bleeding, vomiting wtih TF. PMH includes DM, HTN, HLD, GERD, anxiety, depression, fibromyalgia  Subjective: eager for swallow study  Recommendations for follow up therapy are one component of a multi-disciplinary discharge planning process, led by the attending physician.  Recommendations may be updated based on patient status, additional functional criteria and insurance authorization. Assessment / Plan / Recommendation Clinical Impressions 11/14/2021 Clinical Impression Pt has a mild oropharyngeal dysphagia, improving after first few boluses after giving her a little time to warm up. She had penetration with nectar thick liquids via spoon initially, but progressed up to thin liquids with a single instance of trace penetration that was shallow and readily cleared with a cued throat clear. Lingual propulsion is a little weak and she has a tendency to spill boluses into her valleculae before she swallows, but no aspiration is observed. She did not want to try much in terms of  foods, even purees. She did not consume anything more solid than puree, referencing a burning sensation on her tongue. She would rather start with liquids and this may be a good starting point given her current level of endurance as well. Will start with full liquid diet, ensuring upright positioning as much as possible during PO intake and still maintaining frequent oral care. SLP Visit Diagnosis Dysphagia, unspecified (R13.10) Attention and concentration deficit following -- Frontal lobe and executive function deficit following -- Impact on safety and function Mild aspiration risk   Treatment Recommendations 11/14/2021 Treatment Recommendations Therapy as outlined in treatment plan below   Prognosis 11/14/2021 Prognosis for Safe Diet Advancement Good Barriers to Reach Goals -- Barriers/Prognosis Comment -- Diet Recommendations 11/14/2021 SLP Diet Recommendations Thin liquid Liquid Administration via Cup;Straw Medication Administration Crushed with puree Compensations Slow rate;Small sips/bites Postural Changes Seated  upright at 90 degrees   Other Recommendations 11/14/2021 Recommended Consults -- Oral Care Recommendations Oral care BID Other Recommendations -- Follow Up Recommendations Acute inpatient rehab (3hours/day) Assistance recommended at discharge Intermittent Supervision/Assistance Functional Status Assessment Patient has had a recent decline in their functional status and demonstrates the ability to make significant improvements in function in a reasonable and predictable amount of time. Frequency and Duration  11/14/2021 Speech Therapy Frequency (ACUTE ONLY) min 2x/week Treatment Duration 2 weeks   Oral Phase 11/14/2021 Oral Phase Impaired Oral - Pudding Teaspoon -- Oral - Pudding Cup -- Oral - Honey Teaspoon -- Oral - Honey Cup -- Oral - Nectar Teaspoon Weak lingual manipulation Oral - Nectar Cup -- Oral - Nectar Straw Weak lingual manipulation Oral - Thin Teaspoon -- Oral - Thin Cup Weak lingual  manipulation Oral - Thin Straw Weak lingual manipulation Oral - Puree Weak lingual manipulation;Delayed oral transit Oral - Mech Soft -- Oral - Regular -- Oral - Multi-Consistency -- Oral - Pill -- Oral Phase - Comment --  Pharyngeal Phase 11/14/2021 Pharyngeal Phase Impaired Pharyngeal- Pudding Teaspoon -- Pharyngeal -- Pharyngeal- Pudding Cup -- Pharyngeal -- Pharyngeal- Honey Teaspoon -- Pharyngeal -- Pharyngeal- Honey Cup -- Pharyngeal -- Pharyngeal- Nectar Teaspoon Reduced airway/laryngeal closure;Penetration/Aspiration during swallow Pharyngeal Material enters airway, remains ABOVE vocal cords and not ejected out Pharyngeal- Nectar Cup -- Pharyngeal -- Pharyngeal- Nectar Straw WFL Pharyngeal -- Pharyngeal- Thin Teaspoon -- Pharyngeal -- Pharyngeal- Thin Cup WFL Pharyngeal -- Pharyngeal- Thin Straw Reduced airway/laryngeal closure;Penetration/Aspiration during swallow Pharyngeal Material enters airway, remains ABOVE vocal cords and not ejected out Pharyngeal- Puree WFL Pharyngeal -- Pharyngeal- Mechanical Soft -- Pharyngeal -- Pharyngeal- Regular -- Pharyngeal -- Pharyngeal- Multi-consistency -- Pharyngeal -- Pharyngeal- Pill -- Pharyngeal -- Pharyngeal Comment --  Cervical Esophageal Phase  11/14/2021 Cervical Esophageal Phase WFL Pudding Teaspoon -- Pudding Cup -- Honey Teaspoon -- Honey Cup -- Nectar Teaspoon -- Nectar Cup -- Nectar Straw -- Thin Teaspoon -- Thin Cup -- Thin Straw -- Puree -- Mechanical Soft -- Regular -- Multi-consistency -- Pill -- Cervical Esophageal Comment -- Osie Bond., M.A. [facility] Pager [phone] Office [phone] 11/14/2021, 9:34 AM                     EEG adult  Result Date: 11/04/2021 Greta Doom, MD     11/04/2021  3:33 PM History: 71 yo F with encephalopathy Sedation: fentanyl prn Technique: This EEG was acquired with electrodes placed according to the International 10-20 electrode system (including Fp1, Fp2, F3, F4, C3, C4, P3, P4,  O1, O2, T3, T4, T5, T6, A1, A2, Fz, Cz, Pz). The following electrodes were missing or displaced: none. Background: The background consists predominantly of generalized irregular delta activity.  There is some superimposed irregular theta as well.  There is no clear posterior dominant rhythm.  There was no epileptiform discharges seen.  Clear sleep structures were not seen. Photic stimulation: Physiologic driving is now performed EEG Abnormalities: 1) generalized irregular slow activity 2) absent posterior dominant rhythm Clinical Interpretation: This EEG is consistent with a generalized non-specific cerebral dysfunction(encephalopathy). There was no seizure or seizure predisposition recorded on this study. Please note that lack of epileptiform activity on EEG does not preclude the possibility of epilepsy. Roland Rack, MD [facility] [phone] If 7pm- 7am, please page neurology on call as listed in [place].   EEG adult  Result Date: 10/28/2021 Lora Havens, MD     10/28/2021  9:19 PM Patient  Name: [name] MRN: [id number] Epilepsy Attending: Lora Havens Referring Physician/Provider: Donita Brooks, NP Date: 10/28/2021 Duration: 21.01 mins Patient history: 71yo F s/p cardiac arrest. EEG to evaluate for seizure Level of alertness: comatos AEDs during EEG study: None Technical aspects: This EEG study was done with scalp electrodes positioned according to the 10-20 International system of electrode placement. Electrical activity was acquired at a sampling rate of 500Hz  and reviewed with a high frequency filter of 70Hz  and a low frequency filter of 1Hz . EEG data were recorded continuously and digitally stored. Description: EEG showed near continuous generalized low amplitude 3 to 6 Hz theta-delta slowing admixed with 2-4 seconds of eeg attenuation. There was also 15-18hz  frontocentral beta activity intermittently. Hyperventilation and photic stimulation were not performed.    ABNORMALITY - Continuous slow, generalized - Background attenuation, generalized IMPRESSION: This study is suggestive of severe to profound diffuse encephalopathy, nonspecific etiology. No seizures or epileptiform discharges were seen throughout the recording. Priyanka Barbra Sarks   Overnight EEG with video  Result Date: 10/29/2021 Lora Havens, MD     10/30/2021  8:50 AM Patient Name: [name] MRN: [id number] Epilepsy Attending: Lora Havens Referring Physician/Provider: Donita Brooks, NP Duration: 10/28/2021 1920 to 10/29/2021 1920  Patient history: 71yo F s/p cardiac arrest. EEG to evaluate for seizure  Level of alertness: lethargic  AEDs during EEG study: None  Technical aspects: This EEG study was done with scalp electrodes positioned according to the 10-20 International system of electrode placement. Electrical activity was acquired at a sampling rate of 500Hz  and reviewed with a high frequency filter of 70Hz  and a low frequency filter of 1Hz . EEG data were recorded continuously and digitally stored.  Description: EEG showed near continuous generalized low amplitude 3 to 6 Hz theta-delta slowing admixed with 2-4 seconds of eeg attenuation. There was also 15-18hz  frontocentral beta activity intermittently. Hyperventilation and photic stimulation were not performed.   Patient event button was pressed on 10/29/2021 at 1057 and 1213 for shaking while being repositioned.  Concomitant EEG before, during and after the event did not show any EEG changes suggest seizure.  ABNORMALITY - Continuous slow, generalized - Background attenuation, generalized  IMPRESSION: This study is suggestive of severe to profound diffuse encephalopathy, nonspecific etiology. No seizures or epileptiform discharges were seen throughout the recording. Patient event button was pressed on 10/29/2021 at 1057 and 1213 for shaking while being repositioned without concomitant EEG change.  These episodes were not epileptic.  Lora Havens    ECHOCARDIOGRAM COMPLETE  Result Date: 10/28/2021    ECHOCARDIOGRAM REPORT   Patient Name:   [name] Date of Exam: 10/28/2021 Medical Rec #:  [id number]       Height:       65.0 in Accession #:    [id number]      Weight:       170.0 lb Date of Birth:  Oct 02, 1951       BSA:          1.846 m Patient Age:    23 years        BP:           110/69 mmHg Patient Gender: F               HR:           104 bpm. Exam Location:  Inpatient Procedure: 2D Echo, Color Doppler and Limited Color Doppler STAT ECHO Indications:    Cardiac arrest  History:        Patient has prior history of Echocardiogram examinations.                 Hemoptysis. Respiratory failure requiring mechanical                 ventilation.  Sonographer:    Merrie Roof RDCS Referring Phys: [id number] [facility]  Sonographer Comments: Suboptimal apical window, echo performed with patient supine and on artificial respirator and Technically difficult study due to poor echo windows. IMPRESSIONS  1. Right ventricular function is abnormal. In the setting of cardiac arrest, would rule out pulmonary embolism if clinically indicated.  2. Left ventricular ejection fraction, by estimation, is 70 to 75%. The left ventricle has hyperdynamic function. The left ventricle has no regional wall motion abnormalities. There is moderate concentric left ventricular hypertrophy. Left ventricular diastolic parameters are indeterminate.  3. Hypokinesis of the mid-apical right ventricle with normal systolic function at the base. This pattern is opposite of McConnell's sign, which can be seen in acute pulmonary embolism. Right ventricular systolic function is moderately reduced. The right  ventricular size is normal. There is severely elevated pulmonary artery systolic pressure.  4. Systolic anterior motion (SAM) of the mitral valve. The mitral valve is normal in structure. No evidence of mitral valve regurgitation. No evidence of mitral stenosis.  5. Tricuspid valve  regurgitation is moderate to severe.  6. The aortic valve is tricuspid. Aortic valve regurgitation is not visualized. No aortic stenosis is present.  7. The inferior vena cava is normal in size with <50% respiratory variability, suggesting right atrial pressure of 8 mmHg. FINDINGS  Left Ventricle: Left ventricular ejection fraction, by estimation, is 70 to 75%. The left ventricle has hyperdynamic function. The left ventricle has no regional wall motion abnormalities. The left ventricular internal cavity size was normal in size. There is moderate concentric left ventricular hypertrophy. Left ventricular diastolic parameters are indeterminate. Right Ventricle: Hypokinesis of the mid-apical right ventricle with normal systolic function at the base. This pattern is opposite of McConnell's sign, which can be seen in acute pulmonary embolism. The right ventricular size is normal. No increase in right ventricular wall thickness. Right ventricular systolic function is moderately reduced. There is severely elevated pulmonary artery systolic pressure. The tricuspid regurgitant velocity is 3.84 m/s, and with an assumed right atrial pressure of 8 mmHg, the estimated right ventricular systolic pressure is 93.8 mmHg. Left Atrium: Left atrial size was normal in size. Right At[facility]: Right atrial size was normal in size. Pericardium: There is no evidence of pericardial effusion. Mitral Valve: Systolic anterior motion (SAM) of the mitral valve. The mitral valve is normal in structure. No evidence of mitral valve regurgitation. No evidence of mitral valve stenosis. Tricuspid Valve: The tricuspid valve is normal in structure. Tricuspid valve regurgitation is moderate to severe. No evidence of tricuspid stenosis. Aortic Valve: The aortic valve is tricuspid. Aortic valve regurgitation is not visualized. No aortic stenosis is present. Pulmonic Valve: The pulmonic valve was normal in structure. Pulmonic valve regurgitation is trivial. No  evidence of pulmonic stenosis. Aorta: The aortic root is normal in size and structure. Venous: IVC assessment for right atrial pressure unable to be performed due to mechanical ventilation. The inferior vena cava is normal in size with less than 50% respiratory variability, suggesting right atrial pressure of 8 mmHg. IAS/Shunts: No atrial level shunt detected by color flow Doppler.  LEFT VENTRICLE PLAX 2D LVIDd:  3.20 cm LVIDs:         2.10 cm LV PW:         1.43 cm LV IVS:        1.50 cm LVOT diam:     1.70 cm LVOT Area:     2.27 cm  RIGHT VENTRICLE          IVC RV Basal diam:  4.00 cm  IVC diam: 1.90 cm RV Mid diam:    4.00 cm LEFT ATRIUM         Index LA diam:    3.60 cm 1.95 cm/m   AORTA Ao Root diam: 3.30 cm Ao Asc diam:  2.90 cm TRICUSPID VALVE TR Peak grad:   59.0 mmHg TR Vmax:        384.00 cm/s  SHUNTS Systemic Diam: 1.70 cm Skeet Latch MD Electronically signed by Skeet Latch MD Signature Date/Time: 10/28/2021/3:55:58 PM    Final    IR EMBO ART  VEN HEMORR LYMPH EXTRAV  INC GUIDE ROADMAPPING  Result Date: 11/01/2021 INDICATION: 71 year old woman with acute anemia, hypotension, tachycardia due to large volume intraperitoneal hemorrhage originating from left liver lobe lesion presents to IR for angiogram and embolization. EXAM: 1. Ultrasound-guided access of right common femoral artery 2. Celiac angiogram 3. Common hepatic angiogram 4. Left hepatic angiogram and Gel-Foam embolization 5. Angio-Seal closure of right common femoral artery access MEDICATIONS: None ANESTHESIA/SEDATION: None CONTRAST:  42 mL Intra-arterial Omnipaque 300 FLUOROSCOPY TIME:  Fluoroscopy Time: 7 minutes 42 seconds (466 mGy). COMPLICATIONS: None immediate. PROCEDURE: Informed consent was obtained from the patient's daughter's following explanation of the procedure, risks, benefits and alternatives. The patient's family understands, agrees and consents for the procedure. All questions were addressed. A time out was  performed prior to the initiation of the procedure. Maximal barrier sterile technique utilized including caps, mask, sterile gowns, sterile gloves, large sterile drape, hand hygiene, and chlorhexidine prep. Patient positioned supine on the angiography table. The right groin skin prepped and draped in usual fashion. Ultrasound image documenting patency of the right common femoral artery was obtained and placed in permanent medical record. Sterile ultrasound probe cover and gel utilized throughout the procedure. Using continuous ultrasound guidance, a 21 gauge needle was used to access the right common femoral artery at the level of the femoral head. 21 gauge needle exchanged for a transitional dilator set over 0.018 inch guidewire. Transitional dilator set exchanged for 5 French sheath over 0.035 inch guidewire. Celiac artery accessed with sauce omni catheter. Celiac angiogram showed patent left gastric, splenic, and common hepatic arteries. Microcatheter advanced into the common hepatic artery and angiogram was performed. The gastro duodenal and right hepatic arteries are patent. There is acute cut off of the left hepatic artery likely due to spasm related to hemorrhage in the left hepatic lobe. Microcatheter was advanced into the left hepatic artery an angiogram was performed which again showed the acute cut off of the left hepatic artery. No active extravasation was identified. Given that the CT showed hemorrhage within the left liver lobe, embolization of the left hepatic artery was performed with Gel-Foam slurry until minimal antegrade flow was seen. Post embolization angiogram showed little flow into the left hepatic lobe and reflux into the right hepatic lobe artery. Sheath angiogram showed appropriate puncture of the common femoral artery at the level the femoral head. The groin was re-prepped and draped and closure was performed with 6 [place] Angio-Seal device. IMPRESSION: Successful embolization of the left  hepatic artery with  Gel-Foam slurry. Electronically Signed   By: Miachel Roux M.D.   On: 11/01/2021 14:52   VAS [name] LOWER EXTREMITY VENOUS (DVT)  Result Date: 11/06/2021  Lower Venous DVT Study Patient Name:  [name]  Date of Exam:   11/06/2021 Medical Rec #: [id number]        Accession #:    [id number] Date of Birth: May 17, 1951        Patient Gender: F Patient Age:   33 years Exam Location:  [facility] Procedure:      VAS [name] LOWER EXTREMITY VENOUS (DVT) Referring Phys: Noemi Chapel --------------------------------------------------------------------------------  Indications: Pain.  Risk Factors: None identified. Limitations: Body habitus, poor ultrasound/tissue interface and patient positioning, patient immobility. Comparison Study: 11/02/2021 - Negative for DVT bilaterally. Performing Technologist: Oliver Hum RVT  Examination Guidelines: A complete evaluation includes B-mode imaging, spectral Doppler, color Doppler, and power Doppler as needed of all accessible portions of each vessel. Bilateral testing is considered an integral part of a complete examination. Limited examinations for reoccurring indications may be performed as noted. The reflux portion of the exam is performed with the patient in reverse Trendelenburg.  +---------+---------------+---------+-----------+----------+--------------+  RIGHT     Compressibility Phasicity Spontaneity Properties Thrombus Aging  +---------+---------------+---------+-----------+----------+--------------+  CFV       Full            Yes       Yes                                    +---------+---------------+---------+-----------+----------+--------------+  SFJ       Full                                                             +---------+---------------+---------+-----------+----------+--------------+  FV Prox   Full                                                             +---------+---------------+---------+-----------+----------+--------------+  FV  Mid    Full                                                             +---------+---------------+---------+-----------+----------+--------------+  FV Distal                 Yes       Yes                                    +---------+---------------+---------+-----------+----------+--------------+  PFV       Full                                                             +---------+---------------+---------+-----------+----------+--------------+  POP       Full            Yes       Yes                                    +---------+---------------+---------+-----------+----------+--------------+  PTV       Full                                                             +---------+---------------+---------+-----------+----------+--------------+  PERO      Full                                                             +---------+---------------+---------+-----------+----------+--------------+   +----+---------------+---------+-----------+----------+--------------+  LEFT Compressibility Phasicity Spontaneity Properties Thrombus Aging  +----+---------------+---------+-----------+----------+--------------+  CFV  Full            Yes       Yes                                    +----+---------------+---------+-----------+----------+--------------+    Summary: RIGHT: - There is no evidence of deep vein thrombosis in the lower extremity. However, portions of this examination were limited- see technologist comments above.  - No cystic structure found in the popliteal fossa.  LEFT: - No evidence of common femoral vein obstruction.  *See table(s) above for measurements and observations. Electronically signed by Harold Barban MD on 11/06/2021 at 9:44:21 PM.    Final    VAS [name] LOWER EXTREMITY VENOUS (DVT)  Result Date: 11/02/2021  Lower Venous DVT Study Patient Name:  [name]  Date of Exam:   11/02/2021 Medical Rec #: [id number]        Accession #:    [id number] Date of Birth: [birth year]        Patient Gender: F Patient  Age:   40 years Exam Location:  [facility] Procedure:      VAS [name] LOWER EXTREMITY VENOUS (DVT) Referring Phys: Kipp Brood --------------------------------------------------------------------------------  Indications: Pulmonary embolism.  Limitations: Body habitus and poor ultrasound/tissue interface. Comparison Study: no prior Performing Technologist: Archie Patten RVS  Examination Guidelines: A complete evaluation includes B-mode imaging, spectral Doppler, color Doppler, and power Doppler as needed of all accessible portions of each vessel. Bilateral testing is considered an integral part of a complete examination. Limited examinations for reoccurring indications may be performed as noted. The reflux portion of the exam is performed with the patient in reverse Trendelenburg.  +---------+---------------+---------+-----------+----------+--------------+  RIGHT     Compressibility Phasicity Spontaneity Properties Thrombus Aging  +---------+---------------+---------+-----------+----------+--------------+  CFV       Full            Yes       Yes                                    +---------+---------------+---------+-----------+----------+--------------+  SFJ       Full                                                             +---------+---------------+---------+-----------+----------+--------------+  FV Prox   Full                                                             +---------+---------------+---------+-----------+----------+--------------+  FV Mid                    Yes       Yes                                    +---------+---------------+---------+-----------+----------+--------------+  FV Distal                 Yes       Yes                                    +---------+---------------+---------+-----------+----------+--------------+  PFV       Full                                                             +---------+---------------+---------+-----------+----------+--------------+  POP        Full            Yes       Yes                                    +---------+---------------+---------+-----------+----------+--------------+  PTV       Full                                                             +---------+---------------+---------+-----------+----------+--------------+  PERO      Full                                                             +---------+---------------+---------+-----------+----------+--------------+   +---------+---------------+---------+-----------+----------+--------------+  LEFT      Compressibility Phasicity Spontaneity Properties Thrombus Aging  +---------+---------------+---------+-----------+----------+--------------+  CFV       Full            Yes       Yes                                    +---------+---------------+---------+-----------+----------+--------------+  SFJ       Full                                                             +---------+---------------+---------+-----------+----------+--------------+  FV Prox   Full                                                             +---------+---------------+---------+-----------+----------+--------------+  FV Mid                    Yes       Yes                                    +---------+---------------+---------+-----------+----------+--------------+  FV Distal                 Yes       Yes                                    +---------+---------------+---------+-----------+----------+--------------+  PFV       Full                                                             +---------+---------------+---------+-----------+----------+--------------+  POP       Full            Yes       Yes                                    +---------+---------------+---------+-----------+----------+--------------+  PTV       Full                                                             +---------+---------------+---------+-----------+----------+--------------+  PERO      Full                                                              +---------+---------------+---------+-----------+----------+--------------+     Summary: BILATERAL: - No evidence of deep vein thrombosis seen in the lower extremities, bilaterally. -No evidence of popliteal cyst, bilaterally.   *See table(s) above for measurements and observations. Electronically signed by Orlie Pollen on 11/02/2021 at 6:56:05 PM.    Final    US Abdomen Limited RUQ (LIVER/GB)  Result Date: 10/28/2021 CLINICAL  DATA:  Abnormal CT, evaluate gallbladder. EXAM: ULTRASOUND ABDOMEN LIMITED RIGHT UPPER QUADRANT COMPARISON:  CT abdomen and pelvis 10/28/2021. FINDINGS: Gallbladder: No gallstones are identified. There is gallbladder wall thickening measuring up to 5.6 mm. Pericholecystic fluid is present. No sonographic Murphy sign noted by sonographer. Common bile duct: Diameter: 4.2 mm. Liver: There is a complex hypoechoic area measuring 1.7 x 1.4 x 1.3 cm in the left lobe of the liver. This measures smaller than lesion on CT and remains indeterminate. Within normal limits in parenchymal echogenicity. Portal vein is patent on color Doppler imaging with normal direction of blood flow towards the liver. Other: Trace ascites. IMPRESSION: 1. Gallbladder wall thickening and pericholecystic fluid concerning for acute or chronic cholecystitis. No gallstones or biliary ductal dilatation. 2. Indeterminate left hepatic lesion. Recommend further evaluation with MRI. 3. Trace ascites. Electronically Signed   By: Ronney Asters M.D.   On: 10/28/2021 21:53    Recent Labs: Lab Results  Component Value Date   WBC 12.3 (H) 11/20/2021   HGB 8.5 (L) 11/20/2021   PLT 24 (LL) 11/20/2021   NA 135 11/20/2021   K 4.6 11/20/2021   CL 100 11/20/2021   CO2 24 11/20/2021   GLUCOSE 68 (L) 11/20/2021   BUN 59 (H) 11/20/2021   CREATININE 3.79 (H) 11/20/2021   BILITOT 1.5 (H) 11/16/2021   ALKPHOS 163 (H) 11/16/2021   AST 58 (H) 11/16/2021   ALT 72 (H) 11/16/2021   PROT 5.9 (L) 11/16/2021   ALBUMIN  2.6 (L) 11/20/2021   CALCIUM 8.1 (L) 11/20/2021   GFRAA 51 (L) 04/11/2019    Speciality Comments: No specialty comments available.  Procedures:  No procedures performed Allergies: Aspirin, Benadryl [diphenhydramine], Darvon [propoxyphene], Hydrocodone bit-homatrop mbr, Metformin, Other, Pentazocine, Percocet [oxycodone-acetaminophen], Sulfa antibiotics, Tetracaine hcl, Tetracyclines & related, and Tramadol   Assessment / Plan:     Visit Diagnoses: No diagnosis found.  Orders: No orders of the defined types were placed in this encounter.  No orders of the defined types were placed in this encounter.   Face-to-face time spent with patient was *** minutes. Greater than 50% of time was spent in counseling and coordination of care.  Follow-Up Instructions: No follow-ups on file.   [name], CMA  Note - This record has been created using Editor, commissioning.  Chart creation errors have been sought, but may not always  have been located. Such creation errors do not reflect on  the standard of medical care.

## 2021-11-20 NOTE — Progress Notes (Addendum)
Brief Nutrition Note:   Tolerated iHD 1/19, 1/21 and 1/23 with plans for next iHD ib 1/25. Anuric with renal recovery becoming less likely per nephrology  NPO today for procedure. Noted diet advanced to Dysphagia 2, Thins yesterday from Dysphagia 1.  Appetite remains poor. No recorded po intake yesterday  Pt was not drinking Ensure Enlive; changed to Nepro yesterday and not drinking that either.   Tolerating Vital 1.5 via Cortrak tube  Pt reports significant reflux that is consistent throughout the day. +nausea. Daughters report GERD was a significant issue for patient as an outpatient as well. Pt on protonix; also noted order for carafate and scheduled zofran.   Family reports Dysphagia 2 diet is more "visually appealing" than the Dysphagia 1 diet but pt still not wanting to eat much, especially from hospital food. Pt had hospital mashed potatoes the other day and did not really like so daughter brought in mashed potatoes from [employer]-pt ate a little bit of this but still not a lot. Daughters plan to get soup from [facility] for pt after procedure today.   Interventions:   Continue TF at goal rate: Vital 1.5 at 50 ml/hr Pro-Source TF 45 mL BID Provides 103 g of protein, 1880 kcals, 912 mL of free water  Trial of nocturnal TF once pt showing more interest in eating   Continue Ensure Enlive po BID, each supplement provides 350 kcal and 20 grams of protein VS Boost Breeze po TID, each supplement provides 250 kcal and 9 grams of protein, depending on preferences  Blood sugars will be challenging to manage until po intake  Hypoglycemic when NPO and TF held for procedure. Noted Inpatient DM team following   Kerman Passey MS, RDN, LDN, [place] Registered Dietitian III Clinical Nutrition RD Pager and On-Call Pager Number Located in [place]

## 2021-11-20 NOTE — Progress Notes (Signed)
Overall, Ms. Folkerts is stable.  She is getting hemodialysis.  I think she goes for her catheter placement today.  The platelet count is 24,000.  Her white cell count is 12.3.  Her hemoglobin is 8.4.  She has little bit of a hemoptysis.  I am sure this is from the bivalirudin along with the thrombocytopenia.  Her vital signs are pretty stable.  She is still is on tube feeds.  She apparently does not want to eat the food that is given him.  She has had no problems with the dialysis.  She does seem to be getting stronger.  Hopefully, she will be able to eat and want to eat.  Her BUN is 59 creatinine 3.79.  Again, dialysis does seem to be helping quite a bit.  I do seem to note that when she gets her dialysis, her platelet count tends to drop a little bit.  I do not know if she is getting any type of heparin flushes.  Her last LDH was 584.  I think her liver tests are improving.  I have to believe that the platelet count will follow along with the liver.  We probably need to recheck her liver tests tomorrow.  I think that the platelet count will gradually improve.  I will give her another dose of Nplate today.  I do appreciate the incredible care she is getting from the staff in the CCU.  This is incredibly complicated and the staff has made this look easy.  [name], MD  Deuteronomy 31:6

## 2021-11-20 NOTE — Progress Notes (Signed)
Inpatient Diabetes Program Recommendations  AACE/ADA: New Consensus Statement on Inpatient Glycemic Control (2015)  Target Ranges:  Prepandial:   less than 140 mg/dL      Peak postprandial:   less than 180 mg/dL (1-2 hours)      Critically ill patients:  140 - 180 mg/dL   Lab Results  Component Value Date   GLUCAP 161 (H) 11/20/2021   HGBA1C 9.0 (H) 10/28/2021    Review of Glycemic Control  Latest Reference Range & Units 11/20/21 04:23 11/20/21 04:49 11/20/21 06:09 11/20/21 06:44  Glucose-Capillary 70 - 99 mg/dL 60 (L) 99 64 (L) 161 (H)  (L): Data is abnormally low (H): Data is abnormally high Diabetes history: Type 2 DM Current orders for Inpatient glycemic control: Levemir 12 units BID, Novolog 5 units Q4H, Novolog 0-9 units Q4H   Inpatient Diabetes Program Recommendations:    Inpatient Diabetes Program Recommendations:    Noted hypoglycemia this Am d/t NPO status and pausing of tube feeds.  Consider -decreasing Levemir 8 units BID  -Decreasing Novolog 4 units Q4H (for tube feed coverage to be stopped or held in the event tube feeds are stopped).  Secure chat sent to md.   Thanks, Bronson Curb, MSN, RNC-OB Diabetes Coordinator [phone] (8a-5p)

## 2021-11-20 NOTE — Progress Notes (Signed)
Inpatient Rehabilitation Admissions Coordinator   I met at bedside with patient and her daughter, [name]. We discussed goals and expectations of a possible [facility] admit once medically ready. Plans are for her to stay with her daughter after any rehab. I will follow her progress to begin Auth with [place] when appropriate.  Danne Baxter, RN, MSN Rehab Admissions Coordinator [phone] 11/20/2021 11:39 AM

## 2021-11-20 NOTE — Progress Notes (Signed)
[name] Progress Note     Assessment/ Plan:   Renal failure secondary to ATN in the setting of cardiac arrest, hypotension, pressors, contrast.  BL CKD3A with BL creatinine in the 1.17-1.3 range in late 2021. - L fem HD cath-  been in now for 20 days -  plans to switch to tunneled IJ , platelets have been issue-  hopefully to happen today  HD 11/08/21--> had to have pressor with it - HD treatment 11/10/20--> cathether issues- wonder if BFR wasn't achieved--> BUN up, breathing worse, + fluid balance again, AMS - resumed CRRT 1/15 -18.  Hemodynamics improved.  - Tolerated iHF 1/19, 1/21 and 1/23.  Seems catabolic so not sure can get on a schedule-  do every other day for now-  will tentatively plan for HD on 1/25.  Has been HD dependent since 1/4 with no UOP so prospect of renal recovery is becoming less likely -  I discussed this with patient and family at bedside    Prolonged VF cardiac arrest--> OOH VF arrest 2/2 massive PE  Massive PE s/p mechanical thrombectomy 1/2 of R and L PA.  Vasc US- no DVT, liver mass noted 10/28/21 CT abd/ pelvis, s/p MRI abd--> incompletely characterized, had MRI with gadolinium 1/20  to reevaluate -- not definitive.      Retroperitoneal bleed/Hemorrhagic shock/Acute blood loss anemia originating from left liver lobe s/p embolization 1/4--> required FFP, plts, Vit K, pRBCs; now stable. Hb in 8s now.  Hematology following - deferring any ESA to them.   Thrombocytopenia- component of DIC suspected, HIT negative, on bivalirudin and avoiding heparin per heme, getting platelet transfusions PRN, heme involved, suspects ITP, getting Nplate PRN and dex.  Trending up now ?   Acute hypoxic RF- extubated 11/07/21, now weaned to RA  Sepsis/ leukocytosis/ aspiration pneumonia- MRI with aspiration PNA.  S/p  CTX and vanc   FEN: off TPN now, on Ensure, prosource and vital 1.5-  have changed the ensure to nepro due to high K content   Subjective:    Did get  HD yesterday afternoon-   removed 682-  tentatively scheduled to have tunneled HD cath placed per IR today -  no new c/o's-  maybe some GERD     Objective:   BP 118/65    Pulse 72    Temp 98.7 F (37.1 C) (Axillary)    Resp 13    Ht 5\' 5"  (1.651 m)    Wt 71.6 kg    SpO2 96%    BMI 26.27 kg/m   Intake/Output Summary (Last 24 hours) at 11/20/2021 0750 Last data filed at 11/20/2021 0400 Gross per 24 hour  Intake 805.03 ml  Output 1782 ml  Net -976.97 ml   Weight change: 3.8 kg  Physical Exam: GEN: NAD,lying in bed HEENT:  sclerae anicteric NECK: Supple, no thyromegaly LUNGS: normal WOB on HFNC, coarse ant, no wheezing or rales CV: Reg rate ABD: soft, nontender EXT: edema resolved  Imaging: DG Ankle Right Port  Result Date: 11/19/2021 CLINICAL DATA:  Trauma, pain EXAM: PORTABLE RIGHT ANKLE - 2 VIEW COMPARISON:  None. FINDINGS: No recent fracture or dislocation is seen. Small bony spurs are seen at the tip medial malleolus. Plantar spur is seen in calcaneus. Small bony spurs seen in the dorsal aspect of intertarsal joints. IMPRESSION: No recent fracture or dislocation is seen in the right ankle. There is moderate sized plantar spur in calcaneus. There are bony spurs at the tip of medial  malleolus and dorsal aspect of intertarsal joints, suggesting degenerative arthritis. Electronically Signed   By: Elmer Picker M.D.   On: 11/19/2021 14:48    Labs: BMET Recent Labs  Lab 11/15/21 0407 11/15/21 1601 11/16/21 0444 11/17/21 0516 11/18/21 0604 11/19/21 0516 11/20/21 0410  NA 131* 140 131* 130* 133* 134* 135  K 4.3 3.7 3.9 5.0 5.1 6.2* 4.6  CL 96* 96* 102 96* 100 101 100  CO2 19* 24 22 18* 21* 18* 24  GLUCOSE 186* 183* 189* 314* 203* 228* 68*  BUN 133* 46* 95* 147* 105* 143* 59*  CREATININE 3.76* 1.77* 3.32* 5.22* 4.32* 5.94* 3.79*  CALCIUM 8.2* 9.3 7.2* 8.0* 8.1* 8.2* 8.1*  PHOS 5.9* 2.7 5.2* 8.8* 7.9* 10.5* 6.6*   CBC Recent Labs  Lab 11/17/21 0515 11/18/21 0604  11/19/21 0516 11/20/21 0410  WBC 13.5* 12.6* 14.4* 12.3*  HGB 8.4* 8.2* 8.4* 8.5*  HCT 25.5* 24.4* 25.6* 24.8*  MCV 89.2 89.4 88.9 87.9  PLT 16* 19* 29* 24*    Medications:     atorvastatin  20 mg Oral Daily   B-complex with vitamin C  1 tablet Oral Daily   chlorhexidine  15 mL Mouth Rinse BID   Chlorhexidine Gluconate Cloth  6 each Topical Daily   Chlorhexidine Gluconate Cloth  6 each Topical Q0600   Chlorhexidine Gluconate Cloth  6 each Topical Q0600   feeding supplement (NEPRO CARB STEADY)  237 mL Oral TID BM   feeding supplement (PROSource TF)  45 mL Per Tube BID   folic acid  2 mg Oral Daily   insulin aspart  0-9 Units Subcutaneous Q4H   insulin aspart  5 Units Subcutaneous Q4H   insulin detemir  12 Units Subcutaneous BID   mouth rinse  15 mL Mouth Rinse q12n4p   midodrine  10 mg Oral TID WC   pantoprazole  40 mg Oral Daily    [name] A [facility] Pager [phone]
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
## 2021-11-20 NOTE — Progress Notes (Signed)
Patient arrived from [facility] to 4e15, patient placed on monitor and vital signs obtained. Patient placed on monitor and CCMD made aware. CHG bath complete. Upon skin assessment. Patient with some skin tears to B legs, on arm, rt groin area and buttocks and patient with broken skin near left breast open to air.  Patient also with bruising to upper back/neck area at this time. Patient with Core track in place and Rt chest HD catheter with occlusive dressing. Report given to oncoming RN. Family at bedside. Akiel Fennell, Bettina Gavia rN

## 2021-11-20 NOTE — Progress Notes (Addendum)
ANTICOAGULATION CONSULT NOTE  Pharmacy Consult for bivalirudin > apixaban Indication: pulmonary embolus  Allergies  Allergen Reactions   Aspirin Nausea And Vomiting    Other reaction(s): Unknown   Benadryl [Diphenhydramine]     Can only take dye free. States the dye causes itching.   Darvon [Propoxyphene] Itching    Can only during the day. Night time makes her itch   Hydrocodone Bit-Homatrop Mbr Itching   Metformin     Other reaction(s): GI   Other     Other reaction(s): Unknown. States reaction was to nasal spray that caused pupils to shrink   Pentazocine     nervous Other reaction(s): jitters   Percocet [Oxycodone-Acetaminophen]     itching   Sulfa Antibiotics Hives   Tetracaine Hcl     Other reaction(s): Unknown. Thinks caused itching.   Tetracyclines & Related Hives   Tramadol     Other reaction(s): itch    Patient Measurements: Height: 5\' 5"  (165.1 cm) Weight: 71.6 kg (157 lb 13.6 oz) IBW/kg (Calculated) : 57  Vital Signs: Temp: 98.2 F (36.8 C) (01/24 0833) Temp Source: Oral (01/24 0833) BP: 127/63 (01/24 1000) Pulse Rate: 75 (01/24 1000)  Labs: Recent Labs    11/18/21 0604 11/18/21 1850 11/19/21 0516 11/19/21 1703 11/20/21 0410  HGB 8.2*  --  8.4*  --  8.5*  HCT 24.4*  --  25.6*  --  24.8*  PLT 19*  --  29*  --  24*  APTT 80*   < > 77* 58* 80*  CREATININE 4.32*  --  5.94*  --  3.79*   < > = values in this interval not displayed.     Estimated Creatinine Clearance: 13.1 mL/min (A) (by C-G formula based on SCr of 3.79 mg/dL (H)).   Assessment: 71 yo female with PE s/p thrombectomy, low pltc nadir < 5, negative HIT, on bivalirudin.  aPTT at top of therapeutic goal at 80sec on bivalirudin drip 0.09 mg/kg/hr. No overt bleeding reported, hgb stable 8s and pltc improved to 20s LFTS improved (last labs 1/20) Peak AST 2200>58 ALT 700>72 Tbili 2.7>1.5 Noted AKI in setting of shock, CRRT >> IHD (last session today)  Plan for IR and tunneled HD  placement  Bivalirudin off for procedure Planning to change to apixaban after   PM addendum  S/p IR for Tunneled HD cath  and old one removed  Will start oral anticoagulation tonight  With low platelets and anticoagulation since admit - will begin with apixaban 5mg  BID  Goal of Therapy: Monitor platelets by anticoagulation protocol: Yes   Plan:  Apixaban 5mg  BID Monitor for s/s bleeding    Bonnita Nasuti Pharm.D. CPP, BCPS Clinical Pharmacist [phone] 11/20/2021 11:43 AM   Please check AMION for all [facility] contact numbers

## 2021-11-21 ENCOUNTER — Encounter (HOSPITAL_COMMUNITY): Payer: Self-pay

## 2021-11-21 DIAGNOSIS — J9601 Acute respiratory failure with hypoxia: Secondary | ICD-10-CM | POA: Diagnosis not present

## 2021-11-21 DIAGNOSIS — I469 Cardiac arrest, cause unspecified: Secondary | ICD-10-CM | POA: Diagnosis not present

## 2021-11-21 DIAGNOSIS — I2602 Saddle embolus of pulmonary artery with acute cor pulmonale: Secondary | ICD-10-CM | POA: Diagnosis not present

## 2021-11-21 DIAGNOSIS — N179 Acute kidney failure, unspecified: Secondary | ICD-10-CM | POA: Diagnosis not present

## 2021-11-21 HISTORY — PX: IR US GUIDE VASC ACCESS RIGHT: IMG2390

## 2021-11-21 LAB — RENAL FUNCTION PANEL
Albumin: 2.5 g/dL — ABNORMAL LOW (ref 3.5–5.0)
Anion gap: 16 — ABNORMAL HIGH (ref 5–15)
BUN: 92 mg/dL — ABNORMAL HIGH (ref 8–23)
CO2: 20 mmol/L — ABNORMAL LOW (ref 22–32)
Calcium: 8.4 mg/dL — ABNORMAL LOW (ref 8.9–10.3)
Chloride: 96 mmol/L — ABNORMAL LOW (ref 98–111)
Creatinine, Ser: 5.24 mg/dL — ABNORMAL HIGH (ref 0.44–1.00)
GFR, Estimated: 8 mL/min — ABNORMAL LOW (ref >60)
Glucose, Bld: 242 mg/dL — ABNORMAL HIGH (ref 70–99)
Phosphorus: 8.7 mg/dL — ABNORMAL HIGH (ref 2.5–4.6)
Potassium: 4.8 mmol/L (ref 3.5–5.1)
Sodium: 132 mmol/L — ABNORMAL LOW (ref 135–145)

## 2021-11-21 LAB — CBC
HCT: 25 % — ABNORMAL LOW (ref 36.0–46.0)
Hemoglobin: 8.1 g/dL — ABNORMAL LOW (ref 12.0–15.0)
MCH: 29.1 pg (ref 26.0–34.0)
MCHC: 32.4 g/dL (ref 30.0–36.0)
MCV: 89.9 fL (ref 80.0–100.0)
Platelets: 31 10*3/uL — ABNORMAL LOW (ref 150–400)
RBC: 2.78 MIL/uL — ABNORMAL LOW (ref 3.87–5.11)
RDW: 19 % — ABNORMAL HIGH (ref 11.5–15.5)
WBC: 12.3 10*3/uL — ABNORMAL HIGH (ref 4.0–10.5)
nRBC: 0 % (ref 0.0–0.2)

## 2021-11-21 LAB — HEPATIC FUNCTION PANEL
ALT: 56 U/L — ABNORMAL HIGH (ref 0–44)
AST: 59 U/L — ABNORMAL HIGH (ref 15–41)
Albumin: 2.5 g/dL — ABNORMAL LOW (ref 3.5–5.0)
Alkaline Phosphatase: 146 U/L — ABNORMAL HIGH (ref 38–126)
Bilirubin, Direct: 0.5 mg/dL — ABNORMAL HIGH (ref 0.0–0.2)
Indirect Bilirubin: 0.4 mg/dL (ref 0.3–0.9)
Total Bilirubin: 0.9 mg/dL (ref 0.3–1.2)
Total Protein: 5.9 g/dL — ABNORMAL LOW (ref 6.5–8.1)

## 2021-11-21 LAB — LACTATE DEHYDROGENASE: LDH: 773 U/L — ABNORMAL HIGH (ref 98–192)

## 2021-11-21 LAB — MAGNESIUM: Magnesium: 2.4 mg/dL (ref 1.7–2.4)

## 2021-11-21 LAB — GLUCOSE, CAPILLARY
Glucose-Capillary: 176 mg/dL — ABNORMAL HIGH (ref 70–99)
Glucose-Capillary: 193 mg/dL — ABNORMAL HIGH (ref 70–99)
Glucose-Capillary: 195 mg/dL — ABNORMAL HIGH (ref 70–99)
Glucose-Capillary: 218 mg/dL — ABNORMAL HIGH (ref 70–99)
Glucose-Capillary: 279 mg/dL — ABNORMAL HIGH (ref 70–99)
Glucose-Capillary: 87 mg/dL (ref 70–99)

## 2021-11-21 NOTE — Consult Note (Signed)
Consultation Note Date: 11/21/2021   Patient Name: [name]  DOB: 09/10/1951  MRN: [id number]  Age / Sex: 71 y.o., female  PCP: [name], MD Referring Physician: Aline August, MD  Reason for Consultation: Establishing goals of care and Psychosocial/spiritual support  HPI/Patient Profile: 72 y.o. female   admitted on 10/28/2021 with   a history of fibromyalgia, asthma, HTN who presented after a presyncopal episode at home where her daughter lowered her to the ground. She regained some consciousness and told her daughter she felt like her BG was low. She lost consciousness right before EMS arrived. When EMS arrived she was in VF and had 20 min of CPR. She received shocks, amiodarone, epinephrine. She coded twice in the ED, 4 min and 2 min.  Initial EKG junctional rhythm, concerning for STEMI, but due to electrolyte abnormalities and acidosis, was not a candidate for heart catherization at this time. Possible seizure in the ED  Complicated hospitalization.  Today is day 25 of this hospital stay.   1/1 Admit post VF arrest  1/2 S/p mechanical thrombectomy of right and left PA 1/3 Weaned to low dose epi and levo. In the evening increased pressor requirement and Hg drop to ~5. Found with large intraperitoneal/intraparenchymal hemorrhage extending into left subcapsular hematoma. Received PRBC x 5, FFP x 5 and Plt x 1 1/4 Overnight worsening pressor requirement and Hg ~4. Transfused PRBC x 3, FFP x 2 and platelet x 2. S/p embolization of left hepatic lobe 1/8 meropenem and vanc started, CT head WNL 1/10 extubated 1/11 2 platelet transfusions, consult Hematology 1/12 low gr fever , 1 unit platelet transfused, underwent HD, less responsive >>stat head CT neg ,Coughed up blood clot  1/12 MRI abdomen large area of intrahepatic hemorrhage, cannot exclude underlying mass lesion without contrast, distended  gallbladder, intraperitoneal hemorrhage, bilateral lower lobe pneumonia 1/13 Transfuse 1 unit of blood 2 units of platelets 1/14 Episodes of respiratory distress with wheezing 1/15 Multiple episodes of respiratory distress with wheezing, requiring transient BiPAP, NT suctioning with bleeding, Started on Precedex for severe anxiety/agitation Issues with HD catheter not working, suboptimal HD for 2 hours Afebrile Transient hypotension improved, sugars very high and restarted on insulin drip. CRRT resumed 1/16 still on high FIO2 80% heated high flow. PLTs down again to [facility] 1/17 Platelets up to 11.  Delirium overnight noted.  Not sleeping. 1/18 Plt 14K, continues on low-dose NE and CRRT. Plan for transition to iHD. 1/19 Plt 12K, off of pressors, CRRT stopped. Possible transition to iHD today. Insulin gtt to SQ. Dexamethasone to end today. 1/20 Hyperglycemic to 400s overnight off of insulin gtt, resumed. MRI Liver today to characterize liver lesions/assess for malignancy. Tolerated iHD. Midodrine increased. 1/ 26-plan is for bone marrow biopsy tomorrow   Patient and her family face treatment option decisions, advanced directive decisions and anticipatory care needs.  Clinical Assessment and Goals of Care:  This NP Wadie Lessen reviewed medical records, received report from team, assessed the patient and then meet at the patient's bedside  initially with her daughter/[name], and then later alone at patient's request to discuss diagnosis, prognosis, GOC, EOL wishes disposition and options.  Values and goals of care important to patient and family were attempted to be elicited.   Concept of Palliative Care was introduced as specialized medical care for people and their families living with serious illness.  If focuses on providing relief from the symptoms and stress of a serious illness.  The goal is to improve quality of life for both the patient and the family.  Created space and opportunity for patient   and family to explore thoughts and feelings regarding current medical situation.  Initially [name]/daughter verbalized her frustration with her mother that she will not "even try to do physical therapy or eat".  Patient verbalizes irritation and frustration with her daughter "she thinks she is in charge".  Education offered on the concept regarding the physical and emotional impact of serious illness on the  patient but on the family also.  Mother and daughter are able to acknowledge that to each other.           Patient requested specifically that this nurse practitioner come back at a time where she could speak to me alone.  Daughter fully supports her mother's wishes and gave a space later in the day to talk.           Further  conversation  with patient alone uncovered the fact that the patient "never wanted to be resuscitated in the first place".  Patient verbalizes that she realizes that this is where she is at this time and for the moment is willing to continue with recommended treatments hoping for improvement and prolonged quality of life.                    Patient engaged in conversation regarding comfort, quality and dignity.  She tells me that quality is of utmost importance. The difference between a aggressive medical intervention path  and a palliative comfort care path for this patient at this time was had.    Education offered to patient regarding the fact that she has autonomy in the right to refuse medical interventions if she so desires.   Patient tells me that she has thought "about all of this, especially the dialysis piece"   For now [name] is open to continue with current medical interventions hoping for improvement.  She wishes to continue conversations with this nurse practitioner/PMT to help her navigate this difficult medical situation.  I let [name] know that I will be out of the hospital until Monday but I will follow-up with her at that time.  However she wishes to  speak with one of my colleagues before then contact information was offered.  I updated [name] by telephone with patient's permission.    Questions and concerns addressed.  Patient  and family encouraged to call with questions or concerns.     PMT will continue to support holistically.           No documented H POA or advanced care planning documents noted.   Currently patient has medical decision-making capacity.  Her daughter [name] Needs is her main support person and next of kin      SUMMARY OF RECOMMENDATIONS    Code Status/Advance Care Planning: DNR-documented today.  Daughter was able to support her mother's decisions.    Palliative Prophylaxis:  Aspiration, Bowel Regimen, Delirium Protocol, Frequent Pain Assessment, and Oral Care  Additional Recommendations (  Limitations, Scope, Preferences): Full Scope Treatment  Psycho-social/Spiritual:  Desire for further Chaplaincy support:yes Additional Recommendations: Emotional support offered  Prognosis:  Unable to determine  Discharge Planning: To Be Determined      Primary Diagnoses: Present on Admission:  (Resolved) Cardiac arrest ([place])   I have reviewed the medical record, interviewed the patient and family, and examined the patient. The following aspects are pertinent.  Past Medical History:  Diagnosis Date   Anxiety    Arthritis    Asthma    Cardiac arrest ([place]) 10/28/2021   Cardiac arrest ([place]) 10/28/2021   Depression    Diabetes mellitus without complication (HCC)    Fibromyalgia    GERD (gastroesophageal reflux disease)    Hyperlipemia    Hypertension    PONV (postoperative nausea and vomiting)    Social History   Socioeconomic History   Marital status: Divorced    Spouse name: Not on file   Number of children: Not on file   Years of education: Not on file   Highest education level: Not on file  Occupational History   Not on file  Tobacco Use   Smoking status: Former    Packs/day: 0.50     Years: 20.00    Pack years: 10.00    Types: Cigarettes    Quit date: 08/10/2010    Years since quitting: 11.2   Smokeless tobacco: Never  Vaping Use   Vaping Use: Never used  Substance and Sexual Activity   Alcohol use: No   Drug use: No   Sexual activity: Not on file  Other Topics Concern   Not on file  Social History Narrative   Not on file   Social Determinants of Health   Financial Resource Strain: Not on file  Food Insecurity: Not on file  Transportation Needs: Not on file  Physical Activity: Not on file  Stress: Not on file  Social Connections: Not on file   Family History  Problem Relation Age of Onset   Diabetes Mother    Heart attack Father    Diabetes Sister    Throat cancer Brother    Hypertension Daughter    Scheduled Meds:  apixaban  5 mg Oral BID   atorvastatin  20 mg Oral Daily   B-complex with vitamin C  1 tablet Oral Daily   chlorhexidine  15 mL Mouth Rinse BID   Chlorhexidine Gluconate Cloth  6 each Topical Daily   Chlorhexidine Gluconate Cloth  6 each Topical Q0600   Chlorhexidine Gluconate Cloth  6 each Topical Q0600   Chlorhexidine Gluconate Cloth  6 each Topical Q0600   DULoxetine  20 mg Oral Daily   feeding supplement (NEPRO CARB STEADY)  237 mL Oral TID BM   feeding supplement (PROSource TF)  45 mL Per Tube BID   folic acid  2 mg Oral Daily   insulin aspart  0-9 Units Subcutaneous Q4H   insulin aspart  4 Units Subcutaneous Q4H   insulin detemir  8 Units Subcutaneous BID   mouth rinse  15 mL Mouth Rinse q12n4p   midodrine  10 mg Oral TID WC   ondansetron  4 mg Per Tube Q8H   pantoprazole  40 mg Oral BID   sucralfate  1 g Per Tube TID   Continuous Infusions:  sodium chloride Stopped (11/11/21 1207)   sodium chloride     sodium chloride     anticoagulant sodium citrate     feeding supplement (VITAL 1.5 CAL) 30 mL/hr at  11/21/21 0500   PRN Meds:.sodium chloride, sodium chloride, sodium chloride, acetaminophen **OR** acetaminophen  (TYLENOL) oral liquid 160 mg/5 mL **OR** acetaminophen, alteplase, anticoagulant sodium citrate, dextrose, diphenhydrAMINE, docusate sodium, Gerhardt's butt cream, heparin, ipratropium-albuterol, lidocaine (PF), lidocaine-prilocaine, ondansetron (ZOFRAN) IV, oxyCODONE, pentafluoroprop-tetrafluoroeth, white petrolatum Medications Prior to Admission:  Prior to Admission medications   Medication Sig Start Date End Date Taking? Authorizing Provider  acetaminophen (TYLENOL) 500 MG tablet Take 500-1,000 mg by mouth every 6 (six) hours as needed for mild pain or headache.    [provider]  albuterol (ACCUNEB) 1.25 MG/3ML nebulizer solution Take 1.25 mg by nebulization every 8 (eight) hours as needed for wheezing or shortness of breath. 07/04/21   [provider]  albuterol (PROVENTIL HFA;VENTOLIN HFA) 108 (90 BASE) MCG/ACT inhaler Inhale 1-2 puffs into the lungs every 4 (four) hours as needed for wheezing or shortness of breath.     [provider]  ALPRAZolam Duanne Moron) 0.25 MG tablet Take 0.25 mg by mouth daily as needed for anxiety. 02/19/19   [provider]  baclofen (LIORESAL) 10 MG tablet TAKE 1 TABLET(10 MG) BY MOUTH TWICE DAILY AS NEEDED FOR MUSCLE SPASMS Patient taking differently: Take 10 mg by mouth 2 (two) times daily as needed for muscle spasms. 06/15/21   Bo Merino, MD  betamethasone dipropionate (DIPROLENE) 0.05 % ointment 1 application to affected area 03/15/15   [provider]  diclofenac Sodium (VOLTAREN) 1 % GEL Apply 4 g topically daily as needed (pain).    [provider]  diphenhydrAMINE HCl (BENADRYL ALLERGY PO)     [provider]  Elastic Bandages & Supports (MEDICAL COMPRESSION STOCKINGS) Randall 1 each by Does not apply route every 6 (six) hours as needed (swelling). 04/28/19   Wieters, Hallie C, PA-C  fluticasone (FLONASE) 50 MCG/ACT nasal spray Place 2 sprays into both nostrils daily. 08/07/10   [provider]   gabapentin (NEURONTIN) 300 MG capsule Take 300 mg by mouth 2 (two) times daily.    [provider]  hydrochlorothiazide (HYDRODIURIL) 12.5 MG tablet Take 12.5 mg by mouth daily. 02/10/19   [provider]  LANTUS SOLOSTAR 100 UNIT/ML Solostar Pen Inject 22 Units into the skin at bedtime. 12/09/18   [provider]  pantoprazole (PROTONIX) 40 MG tablet Take 40 mg by mouth 2 (two) times daily.    [provider]  repaglinide (PRANDIN) 2 MG tablet Take 2 mg by mouth 2 (two) times daily before a meal.    [provider]  simvastatin (ZOCOR) 40 MG tablet Take 40 mg by mouth daily.    [provider]  temazepam (RESTORIL) 15 MG capsule TAKE 1 CAPSULE(15 MG) BY MOUTH AT BEDTIME AS NEEDED Patient taking differently: Take 15 mg by mouth at bedtime as needed for sleep. 10/15/21   Ofilia Neas, PA-C  Vitamin D, Ergocalciferol, (DRISDOL) 1.25 MG (50000 UNIT) CAPS capsule Take 50,000 Units by mouth once a week. 10/08/21   [provider]   Allergies  Allergen Reactions   Aspirin Nausea And Vomiting    Other reaction(s): Unknown   Benadryl [Diphenhydramine]     Can only take dye free. States the dye causes itching.   Darvon [Propoxyphene] Itching    Can only during the day. Night time makes her itch   Hydrocodone Bit-Homatrop Mbr Itching   Metformin     Other reaction(s): GI   Other     Other reaction(s): Unknown. States reaction was to nasal spray that caused pupils  to shrink   Pentazocine     nervous Other reaction(s): jitters   Percocet [Oxycodone-Acetaminophen]     itching   Sulfa Antibiotics Hives   Tetracaine Hcl     Other reaction(s): Unknown. Thinks caused itching.   Tetracyclines & Related Hives   Tramadol     Other reaction(s): itch   Review of Systems  Constitutional:  Positive for fatigue.  Neurological:  Positive for weakness.   Physical Exam Cardiovascular:     Rate and Rhythm: Normal rate.  Pulmonary:      Effort: Pulmonary effort is normal.  Skin:    General: Skin is warm and dry.  Neurological:     Mental Status: She is alert.     Motor: Weakness present.    Vital Signs: BP 127/70    Pulse 75    Temp 97.6 F (36.4 C) (Oral)    Resp 17    Ht 5' 5"  (1.651 m)    Wt 73 kg    SpO2 93%    BMI 26.79 kg/m  Pain Scale: 0-10   Pain Score: 0-No pain   SpO2: SpO2: 93 % O2 Device:SpO2: 93 % O2 Flow Rate: .O2 Flow Rate (L/min): 2 L/min  IO: Intake/output summary:  Intake/Output Summary (Last 24 hours) at 11/21/2021 0755 Last data filed at 11/21/2021 0600 Gross per 24 hour  Intake 989.46 ml  Output 51 ml  Net 938.46 ml    LBM: Last BM Date: 11/21/21 Baseline Weight: Weight: 77.1 kg Most recent weight: Weight: 73 kg     Palliative Assessment/Data:      Signed by: Wadie Lessen, NP   Please contact Palliative Medicine Team phone at [phone] for questions and concerns.  For individual provider: See Shea Evans

## 2021-11-21 NOTE — Care Management Important Message (Signed)
Important Message  Patient Details  Name: [name] MRN: [id number] Date of Birth: [birth year]   Medicare Important Message Given:  Yes     Shelda Altes 11/21/2021, 9:39 AM

## 2021-11-21 NOTE — Procedures (Signed)
Patient was seen on dialysis and the procedure was supervised.  BFR 375  Via TDC BP is  124/66.   Patient appears to be tolerating treatment well  Louis Meckel 11/21/2021

## 2021-11-21 NOTE — Progress Notes (Signed)
PT Cancellation Note  Patient Details Name: [name] MRN: [id number] DOB: May 18, 1951   Cancelled Treatment:    Reason Eval/Treat Not Completed: Patient at procedure or test/unavailable. Pt in HD.   Miguel Barrera 11/21/2021, 9:15 AM [place] Pager [phone] Office [phone]

## 2021-11-21 NOTE — Progress Notes (Signed)
SLP Cancellation Note  Patient Details Name: [name] MRN: [id number] DOB: 05/19/1951   Cancelled treatment:        Pt in HD when attempted. Will continue efforts.    Houston Siren 11/21/2021, 9:58 AM

## 2021-11-21 NOTE — Progress Notes (Signed)
Pt is alert and oriented x 4. Vital signs stale. Pt is afebrile. NSR on the monitor. No acute distress. SPO2 95-98% on room air.  Tube feed with Vital 1.5 started at 20 ml/hr at 08:30 pm. Plan titrate up 10 ml q 8 hrs to maximum rate at 50 per order. Pt has tolerated well. Pt refused to eat her dinner. She drank less than half serving of Nepro Carb. Monitor CBG q 4 hrs.   Bladder scan at 12:30 am found 131 ml of urine. We will monitor.  Kennyth Lose, RN

## 2021-11-21 NOTE — Discharge Instructions (Signed)

## 2021-11-21 NOTE — Progress Notes (Signed)
Speech Language Pathology Treatment: Dysphagia  Patient Details Name: [name] MRN: [id number] DOB: October 28, 1951 Today's Date: 11/21/2021 Time: 1345-1400 SLP Time Calculation (min) (ACUTE ONLY): 15 min  Assessment / Plan / Recommendation Clinical Impression  Treatment focused on consumption of recently upgraded texture to Dys 2 from puree to increase appetite/intake. Session occurred during lunch with daughter present. She complained of stomach pains, requesting to use the bathroom (RN notified). Swallow integrity from oral and pharyngeal standpoint was within functional limits without indications of aspiration. She did need encouragement and consumed approximately 12 bites, 4 sips with therapist. She affirmed liking Dys 2 texture better than puree. She requested to take a break and have daughter assist her in a little while. Therapist will plan to check back once more regarding texture. Do not recommend upgrade at this point given deconditioning. Likely discharge from [facility] soon as issue appears to be more intake than dysphagia.    HPI HPI: Pt is a 71 yo female presenting 1/1 after prolonged OOH VF arrest (20 min of CPR, also coded 2x in ED, 4 min and 3 min) secondary to massive PE complicated by DIC. She is s/p thrombectomy on 1/2. She was orally intubated for ten days. Hospital course further complicated by AKI requiring CRRT, hemorrhagic shock due to hepatic bleeding, vomiting wtih TF. PMH includes DM, HTN, HLD, GERD, anxiety, depression, fibromyalgia      SLP Plan  Continue with current plan of care      Recommendations for follow up therapy are one component of a multi-disciplinary discharge planning process, led by the attending physician.  Recommendations may be updated based on patient status, additional functional criteria and insurance authorization.    Recommendations  Diet recommendations: Dysphagia 2 (fine chop);Thin liquid Liquids provided via: Cup;Straw Medication  Administration: Whole meds with puree Supervision: Staff to assist with self feeding;Full supervision/cueing for compensatory strategies Compensations: Small sips/bites;Slow rate Postural Changes and/or Swallow Maneuvers: Seated upright 90 degrees                Oral Care Recommendations: Oral care BID Follow Up Recommendations: Skilled nursing-short term rehab (<3 hours/day) SLP Visit Diagnosis: Dysphagia, oropharyngeal phase (R13.12) Plan: Continue with current plan of care           Houston Siren  11/21/2021, 2:16 PM
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
## 2021-11-21 NOTE — Progress Notes (Addendum)
Ms. Grimes is now out of the cardiac intensive care unit.  This is wonderful.  She is up on 4 E.  She had the dialysis catheter placed yesterday.  I think this will well.  Her platelet count is slowly drifting upward.  Her BUN today is 92 and creatinine 5.24.  Her LFTs continue to improve.  The LDH is still quite high at 773.  Her white cell count is 12.3.  Hemoglobin 8.1.  Platelet count 31,000.  She still has little bit of hemoptysis.  Surprising, she is now switched over to Eliquis.  Hopefully, this will be absorbed.  Again I just worry little bit about her not eating.    Again we cannot use ESA to help with her hemoglobin/anemia because of the thromboembolic disease.  She is on the tube feeds.  She is having some diarrhea.  She does not have any problems with pain.  There is been no problems with fever.  Her vital signs show temperature 97.6.  Pulse 75.  Blood pressure 127/70.  Her lungs are clear.  She has good air movement bilaterally.  Cardiac exam regular rate and rhythm.  There are no murmurs.  Abdomen is soft.  Bowel sounds are present.  Extremity shows some mild edema in the legs.  Neurological exam is not focal.  Again, the platelets are slowly trending upward.  I did give her another dose of Nplate yesterday.  The LDH being elevated is still a little troublesome to me.  It would be nice to see this decrease.  Again, this is incredibly complicated.  I know that she will get incredible care from all the staff up on 4 E.  [name], MD  Jeneen Rinks 1:5

## 2021-11-21 NOTE — Progress Notes (Signed)
[facility] Progress Note     Assessment/ Plan:   Renal failure secondary to ATN in the setting of cardiac arrest, hypotension, pressors, contrast.  BL CKD3A with BL creatinine in the 1.17-1.3 range in late 2021. - L fem HD cath-  now  switched to tunneled IJ   HD 11/08/21--> had to have pressor with it - HD treatment 11/10/20--> cathether issues- wonder if BFR wasn't achieved--> BUN up, breathing worse, + fluid balance again, AMS - resumed CRRT 1/15 -18.  Hemodynamics improved.  - Tolerated iHF 1/19, 1/21 and 1/23.  Seems catabolic so not sure can get on a schedule-  do every other day for now-  running today on 1/25.  Has been HD dependent since 1/4 with no UOP so prospect of renal recovery is becoming less likely -  I discussed this with patient and family at bedside    Prolonged VF cardiac arrest--> OOH VF arrest 2/2 massive PE  Massive PE s/p mechanical thrombectomy 1/2 of R and L PA.  Vasc US- no DVT, liver mass noted 10/28/21 CT abd/ pelvis, s/p MRI abd--> incompletely characterized, had MRI with gadolinium 1/20  to reevaluate -- not definitive.      Retroperitoneal bleed/Hemorrhagic shock/Acute blood loss anemia originating from left liver lobe s/p embolization 1/4--> required FFP, plts, Vit K, pRBCs; now stable. Hb in 8s now.  Hematology following - they say no ESA. Transfuse when needed   Thrombocytopenia- component of DIC suspected, HIT negative, on bivalirudin and avoiding heparin per heme, getting platelet transfusions PRN, heme involved, suspects ITP, getting Nplate PRN and dex.  Trending up now ?   Acute hypoxic RF- extubated 11/07/21, now weaned to RA  Sepsis/ leukocytosis/ aspiration pneumonia- MRI with aspiration PNA.  S/p  CTX and vanc   FEN: off TPN now, on Ensure, prosource and vital 1.5-  have changed the ensure to nepro due to high K content Bones- phos high-  on TF so no binder-  will check PTH    Subjective:    S/p new TDC yesterday -  seen on HD  today, doesn't seem happy -  also moved out of ICU-  plts trending up     Objective:   BP 124/66    Pulse 68    Temp (!) 97.5 F (36.4 C) (Oral)    Resp 13    Ht 5\' 5"  (1.651 m)    Wt 73 kg    SpO2 99%    BMI 26.78 kg/m   Intake/Output Summary (Last 24 hours) at 11/21/2021 1006 Last data filed at 11/21/2021 0800 Gross per 24 hour  Intake 820 ml  Output 21 ml  Net 799 ml   Weight change: -2.671 kg  Physical Exam: GEN: NAD,lying in bed HEENT:  sclerae anicteric NECK: Supple, no thyromegaly LUNGS: normal WOB on HFNC, coarse ant, no wheezing or rales CV: Reg rate ABD: soft, nontender EXT: edema resolved  Imaging: DG Ankle Right Port  Result Date: 11/19/2021 CLINICAL DATA:  Trauma, pain EXAM: PORTABLE RIGHT ANKLE - 2 VIEW COMPARISON:  None. FINDINGS: No recent fracture or dislocation is seen. Small bony spurs are seen at the tip medial malleolus. Plantar spur is seen in calcaneus. Small bony spurs seen in the dorsal aspect of intertarsal joints. IMPRESSION: No recent fracture or dislocation is seen in the right ankle. There is moderate sized plantar spur in calcaneus. There are bony spurs at the tip of medial malleolus and dorsal aspect of intertarsal joints, suggesting degenerative  arthritis. Electronically Signed   By: Elmer Picker M.D.   On: 11/19/2021 14:48    Labs: BMET Recent Labs  Lab 11/15/21 1601 11/16/21 0444 11/17/21 0516 11/18/21 0604 11/19/21 0516 11/20/21 0410 11/21/21 0121  NA 140 131* 130* 133* 134* 135 132*  K 3.7 3.9 5.0 5.1 6.2* 4.6 4.8  CL 96* 102 96* 100 101 100 96*  CO2 24 22 18* 21* 18* 24 20*  GLUCOSE 183* 189* 314* 203* 228* 68* 242*  BUN 46* 95* 147* 105* 143* 59* 92*  CREATININE 1.77* 3.32* 5.22* 4.32* 5.94* 3.79* 5.24*  CALCIUM 9.3 7.2* 8.0* 8.1* 8.2* 8.1* 8.4*  PHOS 2.7 5.2* 8.8* 7.9* 10.5* 6.6* 8.7*   CBC Recent Labs  Lab 11/18/21 0604 11/19/21 0516 11/20/21 0410 11/21/21 0121  WBC 12.6* 14.4* 12.3* 12.3*  HGB 8.2* 8.4* 8.5*  8.1*  HCT 24.4* 25.6* 24.8* 25.0*  MCV 89.4 88.9 87.9 89.9  PLT 19* 29* 24* 31*    Medications:     apixaban  5 mg Oral BID   atorvastatin  20 mg Oral Daily   B-complex with vitamin C  1 tablet Oral Daily   chlorhexidine  15 mL Mouth Rinse BID   Chlorhexidine Gluconate Cloth  6 each Topical Daily   Chlorhexidine Gluconate Cloth  6 each Topical Q0600   Chlorhexidine Gluconate Cloth  6 each Topical Q0600   Chlorhexidine Gluconate Cloth  6 each Topical Q0600   DULoxetine  20 mg Oral Daily   feeding supplement (NEPRO CARB STEADY)  237 mL Oral TID BM   feeding supplement (PROSource TF)  45 mL Per Tube BID   folic acid  2 mg Oral Daily   insulin aspart  0-9 Units Subcutaneous Q4H   insulin aspart  4 Units Subcutaneous Q4H   insulin detemir  8 Units Subcutaneous BID   mouth rinse  15 mL Mouth Rinse q12n4p   midodrine  10 mg Oral TID WC   ondansetron  4 mg Per Tube Q8H   pantoprazole  40 mg Oral BID   sucralfate  1 g Per Tube TID    Heinrich Fertig A [facility] Pager [phone]

## 2021-11-21 NOTE — Progress Notes (Signed)
Mobility Specialist: Progress Note   11/21/21 1515  Mobility  Activity Dangled on edge of bed  Level of Assistance Minimal assist, patient does 75% or more  Assistive Device None  Activity Response Tolerated fair  $Mobility charge 1 Mobility   Attempted to assist pt to [facility] using St[employer]. Pt able to sit EOB with minA but refusing to stand once sitting despite max encouragement. NT present in the room to assist pt with bathing. Will f/u as able.   [facility]valin Briley Mobility Specialist Mobility Specialist 4 [place]: [phone] Mobility Specialist 2 [place]: [phone]

## 2021-11-22 DIAGNOSIS — J9601 Acute respiratory failure with hypoxia: Secondary | ICD-10-CM | POA: Diagnosis not present

## 2021-11-22 DIAGNOSIS — N179 Acute kidney failure, unspecified: Secondary | ICD-10-CM | POA: Diagnosis not present

## 2021-11-22 DIAGNOSIS — Z515 Encounter for palliative care: Secondary | ICD-10-CM

## 2021-11-22 DIAGNOSIS — J690 Pneumonitis due to inhalation of food and vomit: Secondary | ICD-10-CM | POA: Diagnosis not present

## 2021-11-22 DIAGNOSIS — I2602 Saddle embolus of pulmonary artery with acute cor pulmonale: Secondary | ICD-10-CM | POA: Diagnosis not present

## 2021-11-22 DIAGNOSIS — Z66 Do not resuscitate: Secondary | ICD-10-CM

## 2021-11-22 DIAGNOSIS — E43 Unspecified severe protein-calorie malnutrition: Secondary | ICD-10-CM | POA: Diagnosis not present

## 2021-11-22 DIAGNOSIS — R531 Weakness: Secondary | ICD-10-CM

## 2021-11-22 DIAGNOSIS — I469 Cardiac arrest, cause unspecified: Secondary | ICD-10-CM | POA: Diagnosis not present

## 2021-11-22 LAB — CBC
HCT: 23 % — ABNORMAL LOW (ref 36.0–46.0)
Hemoglobin: 7.5 g/dL — ABNORMAL LOW (ref 12.0–15.0)
MCH: 29.1 pg (ref 26.0–34.0)
MCHC: 32.6 g/dL (ref 30.0–36.0)
MCV: 89.1 fL (ref 80.0–100.0)
Platelets: 18 10*3/uL — CL (ref 150–400)
RBC: 2.58 MIL/uL — ABNORMAL LOW (ref 3.87–5.11)
RDW: 18.7 % — ABNORMAL HIGH (ref 11.5–15.5)
WBC: 12.2 10*3/uL — ABNORMAL HIGH (ref 4.0–10.5)
nRBC: 0 % (ref 0.0–0.2)

## 2021-11-22 LAB — RENAL FUNCTION PANEL
Albumin: 2.3 g/dL — ABNORMAL LOW (ref 3.5–5.0)
Anion gap: 17 — ABNORMAL HIGH (ref 5–15)
BUN: 60 mg/dL — ABNORMAL HIGH (ref 8–23)
CO2: 24 mmol/L (ref 22–32)
Calcium: 8.9 mg/dL (ref 8.9–10.3)
Chloride: 95 mmol/L — ABNORMAL LOW (ref 98–111)
Creatinine, Ser: 3.92 mg/dL — ABNORMAL HIGH (ref 0.44–1.00)
GFR, Estimated: 12 mL/min — ABNORMAL LOW (ref >60)
Glucose, Bld: 110 mg/dL — ABNORMAL HIGH (ref 70–99)
Phosphorus: 5.4 mg/dL — ABNORMAL HIGH (ref 2.5–4.6)
Potassium: 4.7 mmol/L (ref 3.5–5.1)
Sodium: 136 mmol/L (ref 135–145)

## 2021-11-22 LAB — GLUCOSE, CAPILLARY
Glucose-Capillary: 112 mg/dL — ABNORMAL HIGH (ref 70–99)
Glucose-Capillary: 128 mg/dL — ABNORMAL HIGH (ref 70–99)
Glucose-Capillary: 136 mg/dL — ABNORMAL HIGH (ref 70–99)
Glucose-Capillary: 183 mg/dL — ABNORMAL HIGH (ref 70–99)
Glucose-Capillary: 202 mg/dL — ABNORMAL HIGH (ref 70–99)
Glucose-Capillary: 223 mg/dL — ABNORMAL HIGH (ref 70–99)
Glucose-Capillary: 315 mg/dL — ABNORMAL HIGH (ref 70–99)
Glucose-Capillary: 77 mg/dL (ref 70–99)

## 2021-11-22 LAB — MAGNESIUM: Magnesium: 2 mg/dL (ref 1.7–2.4)

## 2021-11-22 MED ORDER — LIDOCAINE HCL 1 % IJ SOLN
INTRAMUSCULAR | Status: AC
  Filled 2021-11-22: qty 10

## 2021-11-22 MED ORDER — ALUM & MAG HYDROXIDE-SIMETH 200-200-20 MG/5ML PO SUSP
15.0000 mL | ORAL | Status: DC | PRN
  Administered 2021-11-22 (×2): 15 mL via ORAL
  Filled 2021-11-22 (×2): qty 30

## 2021-11-22 MED ORDER — CHLORHEXIDINE GLUCONATE CLOTH 2 % EX PADS
6.0000 | MEDICATED_PAD | Freq: Every day | CUTANEOUS | Status: DC
  Administered 2021-11-24: 6 via TOPICAL

## 2021-11-22 MED ORDER — DICYCLOMINE HCL 10 MG PO CAPS
10.0000 mg | ORAL_CAPSULE | Freq: Three times a day (TID) | ORAL | Status: DC
  Administered 2021-11-23 – 2021-12-04 (×28): 10 mg via ORAL
  Filled 2021-11-22 (×38): qty 1

## 2021-11-22 NOTE — Progress Notes (Addendum)
Nutrition Follow-up  DOCUMENTATION CODES:   Severe malnutrition in context of acute illness/injury  INTERVENTION:   Calorie count x 48 hours. Continue Nepro Shake po TID, each supplement provides 425 kcal and 19 grams protein. Encourage intake of meals and supplements.  Continue TF via Cortrak: Vital 1.5 increase to goal rate of 50 ml/h to provide 1880 kcal, 103 gm protein, 917 ml free water daily.  NUTRITION DIAGNOSIS:   Severe Malnutrition related to acute illness as evidenced by moderate muscle depletion, energy intake < or equal to 50% for > or equal to 5 days, mild fat depletion.  Ongoing   GOAL:   Patient will meet greater than or equal to 90% of their needs  Progressing  MONITOR:   Diet advancement, Labs, Weight trends, Other (Comment)  REASON FOR ASSESSMENT:   Ventilator    ASSESSMENT:   71 yo female admitted with acute encephalopathy post OOH prolonged V.fib arrest with multiorgan failure with massive saddle PE and DIC, AKI. PMH includes DM, HTN, HLD, GERD, anxiety, depression, fibromyalgia  Cortrak placed 1/20, tip is gastric.  Currently receiving Vital 1.5 at 30 ml/h Prosource TF 45 ml BID, providing 1160 kcal, 71 gm protein, and 550 ml free water daily.   Remains on a dysphagia 2 diet with thin liquids.  Meal intakes recorded at 0-50%.  Per daughter, patient is not interested in any POs today. Lunch tray at bedside, nothing eaten.  Patient drank a few sips of Nepro shake this morning with medications.   Labs reviewed. Phos 5.4 (H) trending down, PTH level pending CBG: [phone]  Medications reviewed and include B-complex with vitamin C, folic acid, Novolog, Levemir, Protonix.  Admission weight 77.1 kg Current weight 75 kg  Last HD 1/25, no volume removal. Next HD scheduled for 1/27. Anuric Plans for bone marrow biopsy.   Diet Order:   Diet Order             Diet NPO time specified Except for: Sips with Meds  Diet effective midnight            DIET DYS 2 Room service appropriate? Yes with Assist; Fluid consistency: Thin  Diet effective now                   EDUCATION NEEDS:   Not appropriate for education at this time  Skin:  Skin Assessment: Reviewed RN Assessment  Last BM:  1/25 type 6, type 4  Height:   Ht Readings from Last 1 Encounters:  10/28/21 5' 5" (1.651 m)    Weight:   Wt Readings from Last 1 Encounters:  11/22/21 75 kg    BMI:  Body mass index is 27.51 kg/m.  Estimated Nutritional Needs:   Kcal:  1950-2150 kcals  Protein:  100-125 g  Fluid:  >/= 1.8 L    Lucas Mallow RD, LDN, CNSC Please refer to Amion for contact information.

## 2021-11-22 NOTE — Plan of Care (Signed)
Alert and oriented x 4. Verbalized bloating in stomach at start of shift. MD alerted/new orders to hold TF x 1 hour then decrease rate. Pt and family updated on POC. Patient reports relief to abdominal bloating. TF restarted without issue. Lab alerted this Probation officer of critical PLT-18. Hospitalist notified. No new orders received. No safety concerns or distress. Patient compliant with all meds and care.    Problem: Education: Goal: Knowledge of General Education information will improve Description: Including pain rating scale, medication(s)/side effects and non-pharmacologic comfort measures Outcome: Progressing   Problem: Health Behavior/Discharge Planning: Goal: Ability to manage health-related needs will improve Outcome: Progressing   Problem: Clinical Measurements: Goal: Ability to maintain clinical measurements within normal limits will improve Outcome: Progressing Goal: Will remain free from infection Outcome: Progressing Goal: Diagnostic test results will improve Outcome: Progressing   Problem: Pain Managment: Goal: General experience of comfort will improve Outcome: Progressing

## 2021-11-22 NOTE — Progress Notes (Signed)
Physical Therapy Treatment Patient Details Name: [name] MRN: [id number] DOB: 1951-06-17 Today's Date: 11/22/2021   History of Present Illness Pt is a 71 y.o. female admitted 10/28/21 after cardiac arrest requiring 20-min CPR for ROSC; ETT in the field. Pt had 2x additional codes in ED requiring CPR for 4 and 2-min. Workup for STEMI, cardiogenic shock, large saddle PE, RUQ bleeding, 2 rib fxs. S/p PE thrombectomy 1/2. Pt with intraperitoneal hemorrhage s/p embolization 1/5. ETT 1/1-1/10. S/p L femoral non-tunneled HD catheter placed 1/4. CRRT 1/4-1/11; trialled iHD 1/12; CRRT again 1/15-1/18. Began iHD again 1/20. S/p RIJ tunneled HD catheter placed 1/24. Pt with persistent thrombocytopenia; plan for bone marrow biopsy 1/27. PMH includes fibromyalgia, HTN, DM2, CKD, GERD, asthma.   PT Comments    Pt not progressing with mobility. Pt requires max encouragement for mobility progression; tolerated brief standing trials, but adamantly declining additional attempts due to pain and fatigue. Pt remains limited by generalized weakness, decreased activity tolerance, poor balance strategies/postural reactions and impaired cognition; difficult to determine extent of true cognitive impairment vs. desire to participate vs. anxiety related to OOB activity(?). Updating d/c recommendations from CIR to SNF based on pt's current level of activity tolerance; daughter aware. Will continue to follow acutely to address established goals.    Recommendations for follow up therapy are one component of a multi-disciplinary discharge planning process, led by the attending physician.  Recommendations may be updated based on patient status, additional functional criteria and insurance authorization.  Follow Up Recommendations  Skilled nursing-short term rehab (<3 hours/day)     Assistance Recommended at Discharge Frequent or constant Supervision/Assistance  Patient can return home with the following Two people to help  with walking and/or transfers;Assistance with cooking/housework;Assist for transportation;Help with stairs or ramp for entrance;A lot of help with bathing/dressing/bathroom   Equipment Recommendations  Rolling walker (2 wheels);Wheelchair (measurements PT);Wheelchair cushion (measurements PT);Hospital bed (lift equipment)    Recommendations for Other Services       Precautions / Restrictions Precautions Precautions: Fall;Other (comment) Precaution Comments: cortrak Restrictions Weight Bearing Restrictions: No     Mobility  Bed Mobility   Bed Mobility: Supine to Sit, Sit to Sidelying     Supine to sit: Mod assist, HOB elevated, Max assist   Sit to sidelying: Supervision General bed mobility comments: ModA for HHA to elevate trunk, pt did not want PT standing behind her to assist; pt refusing to stand and laying back sideways across bed, requiring maxA to elevate trunk back to sitting, active resistance; return to sidelying without assist, bed flat    Transfers Overall transfer level: Needs assistance Equipment used: Ambulation equipment used Transfers: Sit to/from Stand Sit to Stand: Min assist, +2 physical assistance, Max assist, From elevated surface           General transfer comment: pt reluctant to use stedy ("I don't like it") for transfer. Stedy positioned backwards to allow pt to pull on rail with BUE support, minA+2 to elevate trunk from EOB; pt resisting achieving fully upright posture, bilateral knees hyperextended and braced against bed, pt resisting attempts to initiate further forward weight translation or BLE positioning. Pt adamantly declines additional standing trials or transfer to recliner Transfer via Lift Equipment: Stedy  Ambulation/Gait                   Stairs             Wheelchair Mobility    Modified Rankin (Stroke Patients Only)  Balance Overall balance assessment: Needs assistance Sitting-balance support: No upper  extremity supported, Feet supported Sitting balance-Leahy Scale: Fair Sitting balance - Comments: pt able to prop on R elbow and return to midline without assist, demonstrates significantly improving seated balance   Standing balance support: Bilateral upper extremity supported, Reliant on assistive device for balance Standing balance-Leahy Scale: Poor Standing balance comment: heavy reliance on BUE support and external assist; ultimately not achieving fully upright standing this session, bracing BLEs against bed                            Cognition Arousal/Alertness: Awake/alert Behavior During Therapy: Flat affect Overall Cognitive Status: Impaired/Different from baseline Area of Impairment: Attention, Following commands, Safety/judgement, Awareness, Problem solving                   Current Attention Level: Selective   Following Commands: Follows one step commands with increased time, Follows one step commands inconsistently Safety/Judgement: Decreased awareness of deficits Awareness: Emergent Problem Solving: Slow processing, Decreased initiation, Requires verbal cues General Comments: Difficult to determine extent of true cognitive impairment vs desire to participate vs anxiety. pt requiring significant increased time and encouragement to participate and progress mobility. playing gospel music, which pt enjoys, throughout session in attempt to bring her comfort. by end of session, pt repeating, "I can't" and refusing to continue participating. pt reports, "If they would just let me sit on the edge of the bed, I could get stronger"        Exercises Other Exercises Other Exercises: RLE dorsiflexion stretch; hold 10 sec; x5 - educ daughter on bilateral calf/ankle stretch for pt in supine or sitting - max education that standing is crucial to help this stiffness/pain    General Comments General comments (skin integrity, edema, etc.): Daughter present during session,  working on computer. Discussed d/c planning with daughter at end of session; explained updated rec for SNF due to pt's current activity tolerance, fatigue and mobility progression; daughter reports understanding      Pertinent Vitals/Pain Pain Assessment Pain Assessment: Faces Faces Pain Scale: Hurts whole lot Pain Location: bilateral feet/ankles ("my achilles") Pain Descriptors / Indicators: Tightness, Grimacing, Guarding, Moaning Pain Intervention(s): Limited activity within patient's tolerance, Monitored during session, Other (comment) (gentle stretching/PROM)    Home Living                          Prior Function            PT Goals (current goals can now be found in the care plan section) Acute Rehab PT Goals Patient Stated Goal: lay back down PT Goal Formulation: With patient Time For Goal Achievement: 12/06/21 Potential to Achieve Goals: Fair Progress towards PT goals: Not progressing toward goals - comment (fatigue?desire?cognition?)    Frequency    Min 2X/week      PT Plan Discharge plan needs to be updated;Frequency needs to be updated    Co-evaluation PT/OT/SLP Co-Evaluation/Treatment: Yes Reason for Co-Treatment: For patient/therapist safety;To address functional/ADL transfers PT goals addressed during session: Mobility/safety with mobility;Balance OT goals addressed during session: ADL's and self-care      AM-PAC PT "6 Clicks" Mobility   Outcome Measure  Help needed turning from your back to your side while in a flat bed without using bedrails?: A Lot Help needed moving from lying on your back to sitting on the side of a flat bed without using  bedrails?: A Lot Help needed moving to and from a bed to a chair (including a wheelchair)?: Total Help needed standing up from a chair using your arms (e.g., wheelchair or bedside chair)?: Total Help needed to walk in hospital room?: Total Help needed climbing 3-5 steps with a railing? : Total 6 Click  Score: 8    End of Session Equipment Utilized During Treatment: Gait belt Activity Tolerance: Patient limited by fatigue Patient left: in bed;with call bell/phone within reach;with bed alarm set;with family/visitor present (adamantly refusing transfer to recliner) Nurse Communication: Mobility status;Need for lift equipment PT Visit Diagnosis: Unsteadiness on feet (R26.81);Other abnormalities of gait and mobility (R26.89);Muscle weakness (generalized) (M62.81)     Time: 4142-3953 PT Time Calculation (min) (ACUTE ONLY): 33 min  Charges:  $Therapeutic Activity: 8-22 mins                     Mabeline Caras, PT, DPT Acute Rehabilitation Services  Pager [phone] Office [phone]  Derry Lory 11/22/2021, 3:22 PM
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
## 2021-11-22 NOTE — H&P (Addendum)
HPI: The patient has had a H&P performed within the last 30 days, all history, medications, and exam have been reviewed. The patient denies any interval changes since the H&P.  Request from Dr. Marin Olp to perform bone marrow biopsy and aspiration d/t persistent thrombocytopenia. Procedure tentatively scheduled 11/23/21.   Medications: Prior to Admission medications   Medication Sig Start Date End Date Taking? Authorizing Provider  acetaminophen (TYLENOL) 500 MG tablet Take 500-1,000 mg by mouth every 6 (six) hours as needed for mild pain or headache.    [provider]  albuterol (ACCUNEB) 1.25 MG/3ML nebulizer solution Take 1.25 mg by nebulization every 8 (eight) hours as needed for wheezing or shortness of breath. 07/04/21   [provider]  albuterol (PROVENTIL HFA;VENTOLIN HFA) 108 (90 BASE) MCG/ACT inhaler Inhale 1-2 puffs into the lungs every 4 (four) hours as needed for wheezing or shortness of breath.     [provider]  ALPRAZolam Duanne Moron) 0.25 MG tablet Take 0.25 mg by mouth daily as needed for anxiety. 02/19/19   [provider]  baclofen (LIORESAL) 10 MG tablet TAKE 1 TABLET(10 MG) BY MOUTH TWICE DAILY AS NEEDED FOR MUSCLE SPASMS Patient taking differently: Take 10 mg by mouth 2 (two) times daily as needed for muscle spasms. 06/15/21   Bo Merino, MD  betamethasone dipropionate (DIPROLENE) 0.05 % ointment 1 application to affected area 03/15/15   [provider]  diclofenac Sodium (VOLTAREN) 1 % GEL Apply 4 g topically daily as needed (pain).    [provider]  diphenhydrAMINE HCl (BENADRYL ALLERGY PO)     [provider]  Elastic Bandages & Supports (MEDICAL COMPRESSION STOCKINGS) [place] 1 each by Does not apply route every 6 (six) hours as needed (swelling). 04/28/19   Wieters, Hallie C, PA-C  fluticasone (FLONASE) 50 MCG/ACT nasal spray Place 2 sprays into both nostrils daily. 08/07/10   [provider]   gabapentin (NEURONTIN) 300 MG capsule Take 300 mg by mouth 2 (two) times daily.    [provider]  hydrochlorothiazide (HYDRODIURIL) 12.5 MG tablet Take 12.5 mg by mouth daily. 02/10/19   [provider]  LANTUS SOLOSTAR 100 UNIT/ML Solostar Pen Inject 22 Units into the skin at bedtime. 12/09/18   [provider]  pantoprazole (PROTONIX) 40 MG tablet Take 40 mg by mouth 2 (two) times daily.    [provider]  repaglinide (PRANDIN) 2 MG tablet Take 2 mg by mouth 2 (two) times daily before a meal.    [provider]  simvastatin (ZOCOR) 40 MG tablet Take 40 mg by mouth daily.    [provider]  temazepam (RESTORIL) 15 MG capsule TAKE 1 CAPSULE(15 MG) BY MOUTH AT BEDTIME AS NEEDED Patient taking differently: Take 15 mg by mouth at bedtime as needed for sleep. 10/15/21   Ofilia Neas, PA-C  Vitamin D, Ergocalciferol, (DRISDOL) 1.25 MG (50000 UNIT) CAPS capsule Take 50,000 Units by mouth once a week. 10/08/21   [provider]     Vital Signs: BP (!) 117/51 (BP Location: Left Arm)    Pulse 74    Temp 98 F (36.7 C) (Oral)    Resp 17    Ht 5' 5"  (1.651 m)    Wt 165 lb 5.5 oz (75 kg)    SpO2 96%    BMI 27.51 kg/m   Physical Exam Constitutional:      Appearance: She is ill-appearing.  HENT:     Head: Normocephalic and atraumatic.  Nose:     Comments: Core track in place    Mouth/Throat:     Mouth: Mucous membranes are dry.     Pharynx: Oropharynx is clear.  Cardiovascular:     Rate and Rhythm: Normal rate and regular rhythm.     Pulses: Normal pulses.     Heart sounds: Normal heart sounds. No murmur heard.   No friction rub. No gallop.  Pulmonary:     Effort: Pulmonary effort is normal. No respiratory distress.     Breath sounds: Normal breath sounds.  Neurological:     Mental Status: She is alert.    Mallampati Score:  MD Evaluation Airway: WNL Heart: WNL Abdomen: WNL Chest/ Lungs: WNL Mallampati/Airway  Score: Two  Labs:  CBC: Recent Labs    11/19/21 0516 11/20/21 0410 11/21/21 0121 11/22/21 0232  WBC 14.4* 12.3* 12.3* 12.2*  HGB 8.4* 8.5* 8.1* 7.5*  HCT 25.6* 24.8* 25.0* 23.0*  PLT 29* 24* 31* 18*    COAGS: Recent Labs    11/04/21 1606 11/05/21 0500 11/06/21 0356 11/07/21 1500 11/07/21 2340 11/18/21 1850 11/19/21 0516 11/19/21 1703 11/20/21 0410  INR 1.3* 1.3* 1.3* 1.2  --   --   --   --   --   APTT 34 46* 55* 100*   < > 67* 77* 58* 80*   < > = values in this interval not displayed.    BMP: Recent Labs    11/19/21 0516 11/20/21 0410 11/21/21 0121 11/22/21 0232  NA 134* 135 132* 136  K 6.2* 4.6 4.8 4.7  CL 101 100 96* 95*  CO2 18* 24 20* 24  GLUCOSE 228* 68* 242* 110*  BUN 143* 59* 92* 60*  CALCIUM 8.2* 8.1* 8.4* 8.9  CREATININE 5.94* 3.79* 5.24* 3.92*  GFRNONAA 7* 12* 8* 12*    LIVER FUNCTION TESTS: Recent Labs    11/12/21 0432 11/12/21 1630 11/15/21 0407 11/15/21 1601 11/16/21 0444 11/17/21 0516 11/19/21 0516 11/20/21 0410 11/21/21 0121 11/22/21 0232  BILITOT 1.9*  --  1.5*  --  1.5*  --   --   --  0.9  --   AST 98*  --  107*  --  58*  --   --   --  59*  --   ALT 69*  --  101*  --  72*  --   --   --  56*  --   ALKPHOS 221*  --  222*  --  163*  --   --   --  146*  --   PROT 6.6  --  5.8*  --  5.9*  --   --   --  5.9*  --   ALBUMIN 2.6*   < > 2.3*   < > 3.0*   2.9*   < > 2.8* 2.6* 2.5*   2.5* 2.3*   < > = values in this interval not displayed.    Assessment/Plan:  Pt resting in bed. She is A&O, calm and pleasant. She is in no distress.  Pt daughter at bedside.   Request for bone marrow biopsy and aspiration for continued thrombocytopenia tentatively scheduled for 11/23/21.  Afebrile, WBC 12.2, VSS NPO order placed Lab ordered placed  Risks and benefits of bone marrow biopsy and aspiration was discussed with the patient and/or patient's family including, but not limited to bleeding, infection, damage to adjacent structures or low yield  requiring additional tests.  All of the questions were answered and there is agreement to  proceed.  Consent signed and in chart.   Signed: Tyson Alias 11/22/2021, 1:50 PM

## 2021-11-22 NOTE — Progress Notes (Signed)
Unfortunately, her platelet count is down to 18,000.  As such, I think it is time that we have to consider a bone marrow biopsy for her.  I am not sure as to why her platelet count was not responding.  I would think it be unlikely that she has anything in the bone marrow that would prevent any her from making platelets.  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  We could also be looking at her having hepatic damage that can be transferred over to the bone marrow.  I talked to her about this.  I explained the procedure.  I told her that radiology does this.  She agrees to the procedure.  I would  think that this should be done tomorrow.  I do find it interesting that her platelet count does drop a little bit after she has dialysis.  I do not know if there is any element of consumption.  Regardless, we really need to see if her bone marrow is making platelets.  Her hemoglobin is also dropping.  It is 7.5.I would think that this is mostly from her renal insufficiency.  However, there may be something in the bone marrow that could be causing her to have low red cell production.  We cannot use ESA because of the thromboembolic disease.  She is on Eliquis.  We will have to be careful with the Eliquis given her thrombocytopenia.  She still has NG tube in for feeding.  She said that she is eating some food.  She is having some diarrhea, likely from the tube feeds.  This is still very complicated.  I just think that we are going to have to do a bone marrow test on her to see what is going on in the marrow.  We saw to remember that there is something in the liver that were not sure of.  We have try to figure out what this is but so far, her tests have not been definitive by any means.  I do thank the staff of [facility] for doing a great job with her.    [name], MD  Lurena Joiner 1:37

## 2021-11-22 NOTE — Plan of Care (Signed)
  Problem: Education: Goal: Knowledge of General Education information will improve Description Including pain rating scale, medication(s)/side effects and non-pharmacologic comfort measures Outcome: Progressing   

## 2021-11-22 NOTE — Progress Notes (Signed)
[facility] Progress Note     Assessment/ Plan:   Renal failure secondary to ATN in the setting of cardiac arrest, hypotension, pressors, contrast.  BL CKD3A with BL creatinine in the 1.17-1.3 range in late 2021.    Seems catabolic so not sure can get on a schedule-  do every other day for now-  on 1/25-  will plan for next 1/27.  Has been RRT dependent since 1/4 with no UOP so prospect of renal recovery is becoming less likely -  I discussed this with patient and family at bedside    Prolonged VF cardiac arrest--> OOH VF arrest 2/2 massive PE  Massive PE s/p mechanical thrombectomy 1/2 of R and L PA.  Vasc US- no DVT, liver mass noted 10/28/21 CT abd/ pelvis, s/p MRI abd--> incompletely characterized, had MRI with gadolinium 1/20  to reevaluate -- not definitive.      Retroperitoneal bleed/Hemorrhagic shock/Acute blood loss anemia originating from left liver lobe s/p embolization 1/4--> required FFP, plts, Vit K, pRBCs; now stable. Hb in 8s now.  Hematology following - they say no ESA. Transfuse when needed   Thrombocytopenia- component of DIC suspected, HIT negative, on bivalirudin and avoiding heparin per heme, getting platelet transfusions PRN, heme involved, suspects ITP, getting Nplate PRN and dex. -  going for BMbx    Acute hypoxic RF- extubated 11/07/21, now weaned to RA  Sepsis/ leukocytosis/ aspiration pneumonia- MRI with aspiration PNA.  S/p  CTX and vanc   FEN: off TPN now, on Ensure, prosource and vital 1.5-  have changed the ensure to nepro due to high K content Bones- phos high-  on TF so no binder-  will check PTH -  pending    Subjective:    HD yest-  had headache after-  upset b/c she felt they were not paying attention to her in HD yest-  I saw and know what she is talking about-  no volume removal-  no UOP recorded -  plts and hgb down today -  heme arranging bone marrow biopsy     Objective:   BP (!) 117/51 (BP Location: Left Arm)    Pulse 85     Temp 98 F (36.7 C) (Oral)    Resp 17    Ht 5' 5"  (1.651 m)    Wt 75 kg    SpO2 96%    BMI 27.51 kg/m   Intake/Output Summary (Last 24 hours) at 11/22/2021 0915 Last data filed at 11/22/2021 0421 Gross per 24 hour  Intake 690 ml  Output -144 ml  Net 834 ml   Weight change: -0.029 kg  Physical Exam: GEN: NAD,lying in bed HEENT:  sclerae anicteric NECK: Supple, no thyromegaly LUNGS: normal WOB on HFNC, coarse ant, no wheezing or rales CV: Reg rate ABD: soft, nontender EXT: edema resolved  Imaging: No results found.  Labs: BMET Recent Labs  Lab 11/16/21 0444 11/17/21 0516 11/18/21 0604 11/19/21 0516 11/20/21 0410 11/21/21 0121 11/22/21 0232  NA 131* 130* 133* 134* 135 132* 136  K 3.9 5.0 5.1 6.2* 4.6 4.8 4.7  CL 102 96* 100 101 100 96* 95*  CO2 22 18* 21* 18* 24 20* 24  GLUCOSE 189* 314* 203* 228* 68* 242* 110*  BUN 95* 147* 105* 143* 59* 92* 60*  CREATININE 3.32* 5.22* 4.32* 5.94* 3.79* 5.24* 3.92*  CALCIUM 7.2* 8.0* 8.1* 8.2* 8.1* 8.4* 8.9  PHOS 5.2* 8.8* 7.9* 10.5* 6.6* 8.7* 5.4*   CBC Recent Labs  Lab 11/19/21 0516 11/20/21 0410 11/21/21 0121 11/22/21 0232  WBC 14.4* 12.3* 12.3* 12.2*  HGB 8.4* 8.5* 8.1* 7.5*  HCT 25.6* 24.8* 25.0* 23.0*  MCV 88.9 87.9 89.9 89.1  PLT 29* 24* 31* 18*    Medications:     apixaban  5 mg Oral BID   atorvastatin  20 mg Oral Daily   B-complex with vitamin C  1 tablet Oral Daily   chlorhexidine  15 mL Mouth Rinse BID   Chlorhexidine Gluconate Cloth  6 each Topical Daily   Chlorhexidine Gluconate Cloth  6 each Topical Q0600   Chlorhexidine Gluconate Cloth  6 each Topical Q0600   Chlorhexidine Gluconate Cloth  6 each Topical Q0600   DULoxetine  20 mg Oral Daily   feeding supplement (NEPRO CARB STEADY)  237 mL Oral TID BM   feeding supplement (PROSource TF)  45 mL Per Tube BID   folic acid  2 mg Oral Daily   insulin aspart  0-9 Units Subcutaneous Q4H   insulin aspart  4 Units Subcutaneous Q4H   insulin detemir  8 Units  Subcutaneous BID   mouth rinse  15 mL Mouth Rinse q12n4p   midodrine  10 mg Oral TID WC   pantoprazole  40 mg Oral BID   sucralfate  1 g Per Tube TID    Dann Ventress [facility] Pager [phone]

## 2021-11-22 NOTE — Progress Notes (Signed)
Occupational Therapy Treatment Patient Details Name: [name] MRN: [id number] DOB: Oct 28, 1951 Today's Date: 11/22/2021   History of present illness Pt is a 71 y.o. female admitted 10/28/21 after cardiac arrest requiring 20-min CPR for ROSC; ETT in the field. Pt had 2x additional codes in ED requiring CPR for 4 and 2-min. Workup for STEMI, cardiogenic shock, large saddle PE, RUQ bleeding, 2 rib fxs. S/p PE thrombectomy 1/2. Pt with intraperitoneal hemorrhage s/p embolization 1/5. ETT 1/1-1/10. CRRT 1/4-1/11; trialled iHD 1/12; CRRT again 1/15-1/18. Began iHD again 1/20.  PMH includes fibromyalgia, HTN, DM2, CKD, GERD, asthma.   OT comments  Pt continues to present with decreased strength, balance, and occupational participation. Pt requiring Mod A to elevate trunk and sit at EOB. Facilitating R ankle exercises and stretch. Pt performing sit<>stand from EOB with Min A +2 to power up and then Max support to maintain standing. Pt with decreased activity tolerance. Requiring increased encouragement as pt stating "I can't" to standing and OOB. Update dc recommendation to SNF and will continue to follow acutely as admitted.    Recommendations for follow up therapy are one component of a multi-disciplinary discharge planning process, led by the attending physician.  Recommendations may be updated based on patient status, additional functional criteria and insurance authorization.    Follow Up Recommendations  Skilled nursing-short term rehab (<3 hours/day)    Assistance Recommended at Discharge Frequent or constant Supervision/Assistance  Patient can return home with the following  Two people to help with walking and/or transfers;A lot of help with bathing/dressing/bathroom;Assistance with cooking/housework   Equipment Recommendations  Other (comment) (TBD next venue)    Recommendations for Other Services      Precautions / Restrictions Precautions Precautions: Fall Precaution Comments: L  femoral non-tunneled HD cath - MD cleared getting up with fem catheter       Mobility Bed Mobility Overal bed mobility: Needs Assistance Bed Mobility: Supine to Sit, Sit to Sidelying     Supine to sit: Mod assist, HOB elevated   Sit to sidelying: Min guard General bed mobility comments: Mod A for elevate trunk. Min Guard A for safety but no physical A to returning sidelying.    Transfers Overall transfer level: Needs assistance Equipment used: Ambulation equipment used Transfers: Sit to/from Stand Sit to Stand: Min assist, +2 physical assistance, Max assist, From elevated surface           General transfer comment: use of stedy backwards for UE support. Min A +2 for power up from EOB, pt significantly bracing herself against EOB. unable to achieve upright standing posture. Transfer via Lift Equipment: Stedy   Balance Overall balance assessment: Needs assistance Sitting-balance support: No upper extremity supported, Feet supported Sitting balance-Leahy Scale: Fair Sitting balance - Comments: Static sitting with min guard for safety and lines   Standing balance support: Bilateral upper extremity supported, Reliant on assistive device for balance Standing balance-Leahy Scale: Poor                             ADL either performed or assessed with clinical judgement   ADL Overall ADL's : Needs assistance/impaired                     Lower Body Dressing: Total assistance;Bed level Lower Body Dressing Details (indicate cue type and reason): don socks             Functional mobility during ADLs: Maximal  assistance General ADL Comments: Sit<>stand at EOB only. Min A +2 to power up    Extremity/Trunk Assessment Upper Extremity Assessment Upper Extremity Assessment: Generalized weakness RUE Deficits / Details: hypersensitive   Lower Extremity Assessment Lower Extremity Assessment: Defer to PT evaluation RLE Deficits / Details: hypersensitive to  touch, partial ROM against gravity LLE Deficits / Details: non-tunneled HD cath in groin, partial ROM against gravity        Vision       Perception     Praxis      Cognition Arousal/Alertness: Awake/alert Behavior During Therapy: WFL for tasks assessed/performed Overall Cognitive Status: Impaired/Different from baseline Area of Impairment: Attention, Following commands, Safety/judgement, Awareness, Problem solving                   Current Attention Level: Selective   Following Commands: Follows one step commands with increased time Safety/Judgement: Decreased awareness of deficits Awareness: Emergent Problem Solving: Slow processing, Decreased initiation, Requires verbal cues General Comments: more anxiety and decreased volition. Requiring increased encouragment. At end of session, pt repeating she cant do this and refusing to continue participation        Exercises Exercises: Other exercises Other Exercises Other Exercises: RLE dorsiflexion stretch; hold 10 sec; x5 Other Exercises: RLE massage    Shoulder Instructions       General Comments VSS on RA. Daughter present - on an online meeting during session    Pertinent Vitals/ Pain       Pain Assessment Pain Assessment: Faces Faces Pain Scale: Hurts whole lot Pain Location: stomach Pain Descriptors / Indicators: Grimacing Pain Intervention(s): Monitored during session, Limited activity within patient's tolerance, Repositioned  Home Living                                          Prior Functioning/Environment              Frequency  Min 2X/week        Progress Toward Goals  OT Goals(current goals can now be found in the care plan section)  Progress towards OT goals: Not progressing toward goals - comment  Acute Rehab OT Goals OT Goal Formulation: With patient/family Time For Goal Achievement: 11/26/21 Potential to Achieve Goals: Good ADL Goals Pt Will Perform  Grooming: with set-up;with supervision;sitting Pt Will Perform Upper Body Dressing: with set-up;with supervision;sitting Pt Will Transfer to Toilet: with mod assist;stand pivot transfer;bedside commode Additional ADL Goal #1: Pt will tolerate sitting at EOB with Min Guard A in preparation for ADLs Additional ADL Goal #2: Pt will demonstrate increased activity tolerance to perform four grooming tasks in sitting with Supervision  Plan Discharge plan needs to be updated    Co-evaluation    PT/OT/SLP Co-Evaluation/Treatment: Yes Reason for Co-Treatment: For patient/therapist safety;To address functional/ADL transfers   OT goals addressed during session: ADL's and self-care      AM-PAC OT "6 Clicks" Daily Activity     Outcome Measure   Help from another person eating meals?: A Little Help from another person taking care of personal grooming?: A Little Help from another person toileting, which includes using toliet, bedpan, or urinal?: A Lot Help from another person bathing (including washing, rinsing, drying)?: A Lot Help from another person to put on and taking off regular upper body clothing?: A Lot Help from another person to put on and taking off regular lower body  clothing?: Total 6 Click Score: 13    End of Session Equipment Utilized During Treatment: Gait belt  OT Visit Diagnosis: Unsteadiness on feet (R26.81);Other abnormalities of gait and mobility (R26.89);Muscle weakness (generalized) (M62.81)   Activity Tolerance Patient limited by fatigue   Patient Left in bed;with bed alarm set;with call bell/phone within reach   Nurse Communication Mobility status        Time: 4627-0350 OT Time Calculation (min): 32 min  Charges: OT General Charges $OT Visit: 1 Visit OT Treatments $Self Care/Home Management : 8-22 mins  [place], OTR/L Acute Rehab Pager: [phone] Office: [place] 11/22/2021, 12:53 PM

## 2021-11-22 NOTE — Progress Notes (Addendum)
Notified by RN that platelets are 18,000 this am. Hematology following patient No signs of active bleeding.  Reviewed chart and note states do not transfuse platelets unless actively bleeding. Will continue to monitor closely

## 2021-11-23 ENCOUNTER — Other Ambulatory Visit (HOSPITAL_COMMUNITY): Payer: Self-pay | Admitting: *Deleted

## 2021-11-23 DIAGNOSIS — I953 Hypotension of hemodialysis: Secondary | ICD-10-CM | POA: Diagnosis not present

## 2021-11-23 DIAGNOSIS — N179 Acute kidney failure, unspecified: Secondary | ICD-10-CM | POA: Diagnosis not present

## 2021-11-23 DIAGNOSIS — J690 Pneumonitis due to inhalation of food and vomit: Secondary | ICD-10-CM | POA: Diagnosis not present

## 2021-11-23 DIAGNOSIS — J9601 Acute respiratory failure with hypoxia: Secondary | ICD-10-CM | POA: Diagnosis not present

## 2021-11-23 DIAGNOSIS — I469 Cardiac arrest, cause unspecified: Secondary | ICD-10-CM | POA: Diagnosis not present

## 2021-11-23 DIAGNOSIS — I2602 Saddle embolus of pulmonary artery with acute cor pulmonale: Secondary | ICD-10-CM | POA: Diagnosis not present

## 2021-11-23 LAB — CBC WITH DIFFERENTIAL/PLATELET
Abs Immature Granulocytes: 0.06 10*3/uL (ref 0.00–0.07)
Basophils Absolute: 0.1 10*3/uL (ref 0.0–0.1)
Basophils Relative: 1 %
Eosinophils Absolute: 0.1 10*3/uL (ref 0.0–0.5)
Eosinophils Relative: 1 %
HCT: 24.4 % — ABNORMAL LOW (ref 36.0–46.0)
Hemoglobin: 8.4 g/dL — ABNORMAL LOW (ref 12.0–15.0)
Immature Granulocytes: 1 %
Lymphocytes Relative: 9 %
Lymphs Abs: 1 10*3/uL (ref 0.7–4.0)
MCH: 30.2 pg (ref 26.0–34.0)
MCHC: 34.4 g/dL (ref 30.0–36.0)
MCV: 87.8 fL (ref 80.0–100.0)
Monocytes Absolute: 1 10*3/uL (ref 0.1–1.0)
Monocytes Relative: 9 %
Neutro Abs: 8.9 10*3/uL — ABNORMAL HIGH (ref 1.7–7.7)
Neutrophils Relative %: 79 %
Platelets: 25 10*3/uL — CL (ref 150–400)
RBC: 2.78 MIL/uL — ABNORMAL LOW (ref 3.87–5.11)
RDW: 18.3 % — ABNORMAL HIGH (ref 11.5–15.5)
WBC: 11.1 10*3/uL — ABNORMAL HIGH (ref 4.0–10.5)
nRBC: 0 % (ref 0.0–0.2)

## 2021-11-23 LAB — RENAL FUNCTION PANEL
Albumin: 2.5 g/dL — ABNORMAL LOW (ref 3.5–5.0)
Anion gap: 14 (ref 5–15)
BUN: 86 mg/dL — ABNORMAL HIGH (ref 8–23)
CO2: 21 mmol/L — ABNORMAL LOW (ref 22–32)
Calcium: 8.5 mg/dL — ABNORMAL LOW (ref 8.9–10.3)
Chloride: 94 mmol/L — ABNORMAL LOW (ref 98–111)
Creatinine, Ser: 5.38 mg/dL — ABNORMAL HIGH (ref 0.44–1.00)
GFR, Estimated: 8 mL/min — ABNORMAL LOW (ref >60)
Glucose, Bld: 259 mg/dL — ABNORMAL HIGH (ref 70–99)
Phosphorus: 6.4 mg/dL — ABNORMAL HIGH (ref 2.5–4.6)
Potassium: 5.5 mmol/L — ABNORMAL HIGH (ref 3.5–5.1)
Sodium: 129 mmol/L — ABNORMAL LOW (ref 135–145)

## 2021-11-23 LAB — GLUCOSE, CAPILLARY
Glucose-Capillary: 259 mg/dL — ABNORMAL HIGH (ref 70–99)
Glucose-Capillary: 109 mg/dL — ABNORMAL HIGH (ref 70–99)
Glucose-Capillary: 163 mg/dL — ABNORMAL HIGH (ref 70–99)
Glucose-Capillary: 196 mg/dL — ABNORMAL HIGH (ref 70–99)

## 2021-11-23 LAB — MAGNESIUM: Magnesium: 2 mg/dL (ref 1.7–2.4)

## 2021-11-23 LAB — PARATHYROID HORMONE, INTACT (NO CA): PTH: 129 pg/mL — ABNORMAL HIGH (ref 15–65)

## 2021-11-23 MED ORDER — ALUM & MAG HYDROXIDE-SIMETH 200-200-20 MG/5ML PO SUSP
15.0000 mL | Freq: Four times a day (QID) | ORAL | Status: DC | PRN
  Administered 2021-11-23 – 2021-11-30 (×7): 15 mL via ORAL
  Filled 2021-11-23 (×8): qty 30

## 2021-11-23 MED ORDER — LIDOCAINE VISCOUS HCL 2 % MT SOLN
15.0000 mL | Freq: Four times a day (QID) | OROMUCOSAL | Status: DC | PRN
  Administered 2021-11-23 – 2021-11-26 (×4): 15 mL via ORAL
  Filled 2021-11-23 (×4): qty 15

## 2021-11-23 NOTE — Progress Notes (Signed)
Patient planned for bone marrow biopsy today in IR at 0900, unfortunately patient was taken to HD around 0730 -- cytology is unable to accommodate procedure later in the morning so patient will be rescheduled for bone marrow biopsy on 1/30.   If she is otherwise stable for d/c over the weekend please call IR so that we may schedule her as an outpatient.  Patient to be NPO at midnight on 1/30, CBC w/diff in AM.  Candiss Norse, PA-C
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
## 2021-11-23 NOTE — Plan of Care (Signed)
Alert and oriented x4. Continues to verbalize abdominal discomfort with tube feeding. Reported to MD overnight and orders given to turn TF off x 1 hour then decrease rate to 30 mL/hr. Pt with NPO orders for scheduled procedure in AM so TF off for remainder of night. PRN tylenol x 2 for back pain and generalized discomfort. Patient daughter at bedside, reports that she would like to speak with social work regarding patient changing code status as she feels she is not competent enough to make that decision without family input and MD regarding patient repeated complaints of abdominal pain with TF, medications, and would like to have abdominal US done as well.  Patient currently lying in bed, no distress or safety concerns noted. NPO since midnight. Family remains at bedside. Awaiting transport to HD.  Problem: Coping: Goal: Level of anxiety will decrease Outcome: Progressing   Problem: Pain Managment: Goal: General experience of comfort will improve Outcome: Progressing

## 2021-11-23 NOTE — Progress Notes (Signed)
This chaplain responded to PMT consult for spiritual care.   The chaplain was updated by the PMT before the visit. The Pt. is awake and declined a visit indicating the need to rest after a "full" day. The Pt. and family agreed upon F/U spiritual as needed.   Chaplain Sallyanne Kuster [phone]

## 2021-11-23 NOTE — Progress Notes (Signed)
[facility] Progress Note     Assessment/ Plan:   Renal failure secondary to ATN in the setting of cardiac arrest, hypotension, pressors, contrast.  BL CKD3A with BL creatinine in the 1.17-1.3 range in late 2021.    Seems catabolic so not sure can get on a schedule-  do every other day for now-  had on 1/25-  will plan for next 1/27- today-   timing of next HD tricky-  may have to wait til Monday.  Has been RRT dependent since 1/4 with no UOP so prospect of renal recovery is becoming less likely -  I discussed this with patient and family at bedside    Prolonged VF cardiac arrest--> OOH VF arrest 2/2 massive PE  Massive PE s/p mechanical thrombectomy 1/2 of R and L PA.  Vasc US- no DVT, liver mass noted 10/28/21 CT abd/ pelvis, s/p MRI abd--> incompletely characterized, had MRI with gadolinium 1/20  to reevaluate -- not definitive.      Retroperitoneal bleed/Hemorrhagic shock/Acute blood loss anemia originating from left liver lobe s/p embolization 1/4--> required FFP, plts, Vit K, pRBCs; now stable. Hb in 8s now.  Hematology following - they say no ESA. Transfuse when needed   Thrombocytopenia- component of DIC suspected, HIT negative, on bivalirudin and avoiding heparin per heme, getting platelet transfusions PRN, heme involved, suspects ITP, getting Nplate PRN and dex. -  going for BMbx    Acute hypoxic RF- extubated 11/07/21, now weaned to RA  Sepsis/ leukocytosis/ aspiration pneumonia- MRI with aspiration PNA.  S/p  CTX and vanc   FEN: off TPN now, on Ensure, prosource and vital 1.5-  have changed the ensure to nepro due to high K content Bones- phos high-  on TF so difficult with binder-  will check PTH -  pending    Subjective:    Seen in HD this AM, no c/o's-  also planning for bone marrow biopsy today -  interestingly hgb and platelets are both up  -  no UOP     Objective:   BP 131/60 (BP Location: Left Arm)    Pulse 69    Temp 97.9 F (36.6 C) (Oral)    Resp  14    Ht 5' 5"  (1.651 m)    Wt 75.1 kg    SpO2 99%    BMI 27.55 kg/m   Intake/Output Summary (Last 24 hours) at 11/23/2021 0748 Last data filed at 11/23/2021 0442 Gross per 24 hour  Intake 960 ml  Output 0 ml  Net 960 ml   Weight change: 2.1 kg  Physical Exam: GEN: NAD,lying in bed HEENT:  sclerae anicteric NECK: Supple, no thyromegaly LUNGS: normal WOB on HFNC, coarse ant, no wheezing or rales CV: Reg rate ABD: soft, nontender EXT: edema resolved  Imaging: No results found.  Labs: BMET Recent Labs  Lab 11/17/21 0516 11/18/21 0604 11/19/21 0516 11/20/21 0410 11/21/21 0121 11/22/21 0232 11/23/21 0518  NA 130* 133* 134* 135 132* 136 129*  K 5.0 5.1 6.2* 4.6 4.8 4.7 5.5*  CL 96* 100 101 100 96* 95* 94*  CO2 18* 21* 18* 24 20* 24 21*  GLUCOSE 314* 203* 228* 68* 242* 110* 259*  BUN 147* 105* 143* 59* 92* 60* 86*  CREATININE 5.22* 4.32* 5.94* 3.79* 5.24* 3.92* 5.38*  CALCIUM 8.0* 8.1* 8.2* 8.1* 8.4* 8.9 8.5*  PHOS 8.8* 7.9* 10.5* 6.6* 8.7* 5.4* 6.4*   CBC Recent Labs  Lab 11/20/21 0410 11/21/21 0121 11/22/21 0232 11/23/21  0518  WBC 12.3* 12.3* 12.2* 11.1*  NEUTROABS  --   --   --  8.9*  HGB 8.5* 8.1* 7.5* 8.4*  HCT 24.8* 25.0* 23.0* 24.4*  MCV 87.9 89.9 89.1 87.8  PLT 24* 31* 18* 25*    Medications:     apixaban  5 mg Oral BID   atorvastatin  20 mg Oral Daily   B-complex with vitamin C  1 tablet Oral Daily   chlorhexidine  15 mL Mouth Rinse BID   Chlorhexidine Gluconate Cloth  6 each Topical Daily   Chlorhexidine Gluconate Cloth  6 each Topical Q0600   Chlorhexidine Gluconate Cloth  6 each Topical Q0600   Chlorhexidine Gluconate Cloth  6 each Topical Q0600   Chlorhexidine Gluconate Cloth  6 each Topical Q0600   dicyclomine  10 mg Oral TID AC   DULoxetine  20 mg Oral Daily   feeding supplement (NEPRO CARB STEADY)  237 mL Oral TID BM   feeding supplement (PROSource TF)  45 mL Per Tube BID   folic acid  2 mg Oral Daily   insulin aspart  0-9 Units  Subcutaneous Q4H   insulin aspart  4 Units Subcutaneous Q4H   insulin detemir  8 Units Subcutaneous BID   mouth rinse  15 mL Mouth Rinse q12n4p   midodrine  10 mg Oral TID WC   pantoprazole  40 mg Oral BID   sucralfate  1 g Per Tube TID    [name] A [facility] Pager [phone]

## 2021-11-23 NOTE — Progress Notes (Addendum)
Pt returned from dialysis.   11/23/21 1113  Vitals  Temp 97.7 F (36.5 C)  Temp Source Oral  BP 121/67  MAP (mmHg) 81  BP Location Left Arm  BP Method Automatic  Patient Position (if appropriate) Lying  Pulse Rate 78  Pulse Rate Source Monitor  ECG Heart Rate 78  Resp 15  Level of Consciousness  Level of Consciousness Alert  Oxygen Therapy  SpO2 96 %  O2 Device Room Air  Pain Assessment  Pain Scale 0-10  Pain Score 3  Pain Type Acute pain  Pain Location Abdomen  Pain Orientation Mid  MEWS Score  MEWS Temp 0  MEWS Systolic 0  MEWS Pulse 0  MEWS RR 0  MEWS LOC 0  MEWS Score 0  MEWS Score Color Nyoka Cowden

## 2021-11-23 NOTE — Progress Notes (Signed)
Calorie Count Note  Spoke with patient's 2 adult female family members in room today. They concur that patient is still not interested in eating. RD was asked about a different TF formula d/t patient's "sensitive stomach" and has been in severe pain this morning. Explained that current TF is semi-elemental and easy to tolerate. Answered their questions regarding potential PEG tube placement and care, and how TF could be administered if patient decides to have a PEG tube placed. One family member stated that she does not think patient would want a PEG. They are hopeful that patient's stomach will start feeling better and she'll be able to eat comfortably soon.   48 hour calorie count ordered 1/26.  Diet: Dysphagia 2 with thin liquids Supplements: Nepro Shake TID  Two meal tickets available from yesterday: Lunch (patient ate 0%): 0 kcal, 0 gm protein Dinner (patient had bites): < 50 kcal, 0 gm protein Supplements: patient will only take a few sips of the Nepro shake supplements  Total intake: < 50 kcal (0% of minimum estimated needs)  0 gm protein (0% of minimum estimated needs)  Patient is NPO today for procedures. TF has been held since midnight last night.   Nutrition Dx: Severe Malnutrition related to acute illness as evidenced by moderate muscle depletion, energy intake < or equal to 50% for > or equal to 5 days, mild fat depletion.  Goal: patient will meet >/= 90% of their estimated nutrition needs.  Intervention:  After procedures today, resume TF via Cortrak tube: Vital 1.5 at 50 ml/h with Prosource TF 45 ml BID to provide 1880 kcal, 103 gm protein, 917 ml free water daily. Continue to encourage PO intake of meals and supplements. Continue calorie count over the weekend.   Lucas Mallow RD, LDN, CNSC Please refer to Amion for contact information.

## 2021-11-23 NOTE — TOC Progression Note (Signed)
Transition of Care [facility]) - Progression Note    Patient Details  Name: [name] MRN: [id number] Date of Birth: 1951-02-11  Transition of Care [facility]) CM/SW [place], [place] Phone Number: 11/23/2021, 3:50 PM  Clinical Narrative:     Received message patient's daughter,Sharon wanted to speak with CSW. CSW met with daughter as requested. [name] discussed patient's care and how she wished she was apart of the conversation when the patient decided on DNR status. CSW provided a listening ear and all questions answered.   Thurmond Butts, MSW, LCSW Clinical Social Worker    Expected Discharge Plan: IP Rehab Facility Barriers to Discharge: Continued Medical Work up  Expected Discharge Plan and Services Expected Discharge Plan: [place] In-house Referral: Clinical Social Work Discharge Planning Services: CM Consult Post Acute Care Choice: [place] arrangements for the past 2 months: Apartment                                       Social Determinants of Health (SDOH) Interventions    Readmission Risk Interventions Readmission Risk Prevention Plan 11/02/2021  Transportation Screening Complete  Some recent data might be hidden

## 2021-11-23 NOTE — Progress Notes (Signed)
Inpatient Rehabilitation Admissions Coordinator   I continue to follow pt's progress at a distance. Noted current therapy recommendations are for SNF.  Danne Baxter, RN, MSN Rehab Admissions Coordinator [phone] 11/23/2021 2:08 PM

## 2021-11-23 NOTE — Progress Notes (Addendum)
Palliative Care Progress Note, Assessment & Plan   Patient Name: [name]       Date: 11/23/2021 DOB: [birth year]  Age: 71 y.o. MRN#: [id number] Attending Physician: Aline August, MD Primary Care Physician: [name], MD Admit Date: 10/28/2021  Subjective: Patient is sitting in bed in no apparent distress.  Core track in place.  Patient acknowledges my presence and is able to make her wishes known.  Daughter [name] is at bedside.  HPI: As per my colleague's note of 1/26, 71 y.o. female   admitted on 10/28/2021 with a history of fibromyalgia, asthma, HTN who presented after a presyncopal episode at home where her daughter lowered her to the ground. She regained some consciousness and told her daughter she felt like her BG was low. She lost consciousness before EMS arrived.   When EMS arrived she was in VF and had 20 min of CPR. She received shocks, amiodarone, epinephrine. She coded twice in the ED, 4 min and 2 min.  Initial EKG junctional rhythm, concerning for STEMI, but due to electrolyte abnormalities and acidosis, was not a candidate for heart catherization at this time. Possible seizure in the ED   Complicated hospitalization.  Today is day 26 of this hospital stay.  Summary of counseling/coordination of care: I was contacted by patient's daytime nurse Shanon Brow.  Shanon Brow conveyed patient's daughter [name] had questions and concerns regarding patient's CODE STATUS.  After reviewing the patient's chart I came to bedside.  I met with patient and [name].  Discussed DNR.  Therapeutic silence and active listening provided for [name] to share thoughts and emotions regarding her mother's CODE STATUS and current health status.  Patient again confirms she would like to be a DNR and allow natural death  should she have a cardiopulmonary arrest.  [name] shared she wishes she had been able to be part of this conversation.  I shared that I am happy to answer any questions and provided information regarding DNR status.  However, patient has capacity and ability to make decisions for herself.  [name] shared she supports her mother's decisions but just wishes she had been able to be a part of it.  [name] also suggested the patient has bits of confusion.  Again I reiterated patient has capacity ability to make her own informed medical decisions.  Palliative medicine team will continue to follow the patient throughout her hospitalization.  All questions and concerns were addressed.  Spiritual care consult offered.  [name] and patient both receptive to meeting with chaplain.  Full report given to PMT chaplain Dorian Pod.  Code Status: DNR  Prognosis: Unable to determine  Discharge Planning: To Be Determined  Recommendations/Plan: DNR remains Treat the treatable Spiritual care consult  Care plan was discussed with patient, patient's daughter Delma Post  Physical Exam Vitals and nursing note reviewed.  Constitutional:      Appearance: Normal appearance.  HENT:     Head: Normocephalic.     Nose:     Comments: Cortrak in place    Mouth/Throat:     Mouth: Mucous membranes are moist.  Cardiovascular:     Rate and Rhythm: Normal rate.     Pulses: Normal pulses.  Pulmonary:  Effort: Pulmonary effort is normal.  Abdominal:     Palpations: Abdomen is soft.  Musculoskeletal:     Cervical back: Normal range of motion.     Comments: Generalized weakness  Neurological:     Mental Status: She is alert. Mental status is at baseline.  Psychiatric:        Mood and Affect: Mood normal.        Behavior: Behavior normal.        Thought Content: Thought content normal.        Judgment: Judgment normal.            Palliative Assessment/Data: 50%    Total Time 25 minutes  Greater than  50%  of this time was spent counseling and coordinating care related to the above assessment and plan.  Thank you for allowing the Palliative Medicine Team to assist in the care of this patient.  [place] Ilsa Iha, FNP-BC Palliative Medicine Team Team Phone # [phone]

## 2021-11-23 NOTE — Procedures (Signed)
Patient was seen on dialysis and the procedure was supervised.  BFR 400  Via TDC BP is  131/60.   Patient appears to be tolerating treatment well  Louis Meckel 11/23/2021

## 2021-11-24 ENCOUNTER — Other Ambulatory Visit: Payer: Self-pay | Admitting: Radiology

## 2021-11-24 DIAGNOSIS — D72829 Elevated white blood cell count, unspecified: Secondary | ICD-10-CM

## 2021-11-24 DIAGNOSIS — I2602 Saddle embolus of pulmonary artery with acute cor pulmonale: Secondary | ICD-10-CM | POA: Diagnosis not present

## 2021-11-24 DIAGNOSIS — J690 Pneumonitis due to inhalation of food and vomit: Secondary | ICD-10-CM | POA: Diagnosis not present

## 2021-11-24 DIAGNOSIS — I469 Cardiac arrest, cause unspecified: Secondary | ICD-10-CM | POA: Diagnosis not present

## 2021-11-24 LAB — RENAL FUNCTION PANEL
Albumin: 2.4 g/dL — ABNORMAL LOW (ref 3.5–5.0)
Anion gap: 12 (ref 5–15)
BUN: 43 mg/dL — ABNORMAL HIGH (ref 8–23)
CO2: 25 mmol/L (ref 22–32)
Calcium: 8.4 mg/dL — ABNORMAL LOW (ref 8.9–10.3)
Chloride: 96 mmol/L — ABNORMAL LOW (ref 98–111)
Creatinine, Ser: 3.69 mg/dL — ABNORMAL HIGH (ref 0.44–1.00)
GFR, Estimated: 12 mL/min — ABNORMAL LOW (ref >60)
Glucose, Bld: 130 mg/dL — ABNORMAL HIGH (ref 70–99)
Phosphorus: 4.3 mg/dL (ref 2.5–4.6)
Potassium: 4.6 mmol/L (ref 3.5–5.1)
Sodium: 133 mmol/L — ABNORMAL LOW (ref 135–145)

## 2021-11-24 LAB — CBC
HCT: 22.8 % — ABNORMAL LOW (ref 36.0–46.0)
Hemoglobin: 7.4 g/dL — ABNORMAL LOW (ref 12.0–15.0)
MCH: 28.9 pg (ref 26.0–34.0)
MCHC: 32.5 g/dL (ref 30.0–36.0)
MCV: 89.1 fL (ref 80.0–100.0)
Platelets: 26 10*3/uL — CL (ref 150–400)
RBC: 2.56 MIL/uL — ABNORMAL LOW (ref 3.87–5.11)
RDW: 18.3 % — ABNORMAL HIGH (ref 11.5–15.5)
WBC: 11.2 10*3/uL — ABNORMAL HIGH (ref 4.0–10.5)
nRBC: 0 % (ref 0.0–0.2)

## 2021-11-24 LAB — GLUCOSE, CAPILLARY
Glucose-Capillary: 102 mg/dL — ABNORMAL HIGH (ref 70–99)
Glucose-Capillary: 172 mg/dL — ABNORMAL HIGH (ref 70–99)
Glucose-Capillary: 195 mg/dL — ABNORMAL HIGH (ref 70–99)
Glucose-Capillary: 241 mg/dL — ABNORMAL HIGH (ref 70–99)
Glucose-Capillary: 258 mg/dL — ABNORMAL HIGH (ref 70–99)
Glucose-Capillary: 96 mg/dL (ref 70–99)

## 2021-11-24 LAB — MAGNESIUM: Magnesium: 2 mg/dL (ref 1.7–2.4)

## 2021-11-24 NOTE — Progress Notes (Signed)
[facility] Progress Note     Assessment/ Plan:   Renal failure secondary to ATN in the setting of cardiac arrest, hypotension, pressors, contrast.  BL CKD3A with BL creatinine in the 1.17-1.3 range in late 2021.    Seems catabolic so not sure can get on a schedule-  do every other day for now-  had on 1/25 and 1/27-    timing of next HD tricky-  may have to wait til Monday.  Has been RRT dependent since 1/4 with no UOP so prospect of renal recovery is becoming less likely -  I discussed this with patient and family at bedside -  now made a little urine so we will see     VF cardiac arrest--> OOH VF arrest 2/2 massive PE  Massive PE s/p mechanical thrombectomy 1/2 of R and L PA.  Vasc US- no DVT, liver mass noted 10/28/21 CT abd/ pelvis, s/p MRI abd--> incompletely characterized, had MRI with gadolinium 1/20  to reevaluate -- not definitive.      Retroperitoneal bleed/Hemorrhagic shock/Acute blood loss anemia originating from left liver lobe s/p embolization 1/4--> required FFP, plts, Vit K, pRBCs; now stable. Hb in 8s----- 7's now.  Hematology following - they say no ESA. Transfuse when needed   Thrombocytopenia- component of DIC suspected, HIT negative, on bivalirudin and avoiding heparin per heme, getting platelet transfusions PRN, heme involved, suspects ITP, getting Nplate PRN and dex. -  had BMbx    Acute hypoxic RF- extubated 11/07/21, now weaned to RA-  using midodrine to assist with BP support to achieve UF   Sepsis/ leukocytosis/ aspiration pneumonia- MRI with aspiration PNA.  S/p  CTX and vanc   FEN: off TPN now, on Ensure, prosource and vital 1.5-  have changed the ensure to nepro due to high K content Bones- phos high-  on TF so difficult with binder, controlling with HD for now-   PTH 129   Subjective:    S/p HD yest-  removed 1000-  also noted to have some urine overnight-  125 recorded - she is excited about that !-  labs reflective of HD yesterday      Objective:   BP 122/80 (BP Location: Left Arm)    Pulse 76    Temp 98.7 F (37.1 C) (Oral)    Resp 18    Ht 5\' 5"  (1.651 m)    Wt 71.4 kg    SpO2 96%    BMI 26.19 kg/m   Intake/Output Summary (Last 24 hours) at 11/24/2021 0855 Last data filed at 11/24/2021 0817 Gross per 24 hour  Intake 420 ml  Output 1125 ml  Net -705 ml   Weight change:   Physical Exam: GEN: NAD,lying in bed HEENT:  sclerae anicteric NECK: Supple, no thyromegaly LUNGS: normal WOB on HFNC, coarse ant, no wheezing or rales CV: Reg rate ABD: soft, nontender EXT: edema resolved  Imaging: No results found.  Labs: BMET Recent Labs  Lab 11/18/21 0604 11/19/21 0516 11/20/21 0410 11/21/21 0121 11/22/21 0232 11/23/21 0518 11/24/21 0309  NA 133* 134* 135 132* 136 129* 133*  K 5.1 6.2* 4.6 4.8 4.7 5.5* 4.6  CL 100 101 100 96* 95* 94* 96*  CO2 21* 18* 24 20* 24 21* 25  GLUCOSE 203* 228* 68* 242* 110* 259* 130*  BUN 105* 143* 59* 92* 60* 86* 43*  CREATININE 4.32* 5.94* 3.79* 5.24* 3.92* 5.38* 3.69*  CALCIUM 8.1* 8.2* 8.1* 8.4* 8.9 8.5* 8.4*  PHOS 7.9*  10.5* 6.6* 8.7* 5.4* 6.4* 4.3   CBC Recent Labs  Lab 11/21/21 0121 11/22/21 0232 11/23/21 0518 11/24/21 0309  WBC 12.3* 12.2* 11.1* 11.2*  NEUTROABS  --   --  8.9*  --   HGB 8.1* 7.5* 8.4* 7.4*  HCT 25.0* 23.0* 24.4* 22.8*  MCV 89.9 89.1 87.8 89.1  PLT 31* 18* 25* 26*    Medications:     apixaban  5 mg Oral BID   atorvastatin  20 mg Oral Daily   B-complex with vitamin C  1 tablet Oral Daily   chlorhexidine  15 mL Mouth Rinse BID   Chlorhexidine Gluconate Cloth  6 each Topical Daily   Chlorhexidine Gluconate Cloth  6 each Topical Q0600   Chlorhexidine Gluconate Cloth  6 each Topical Q0600   Chlorhexidine Gluconate Cloth  6 each Topical Q0600   Chlorhexidine Gluconate Cloth  6 each Topical Q0600   dicyclomine  10 mg Oral TID AC   DULoxetine  20 mg Oral Daily   feeding supplement (NEPRO CARB STEADY)  237 mL Oral TID BM   feeding supplement  (PROSource TF)  45 mL Per Tube BID   folic acid  2 mg Oral Daily   insulin aspart  0-9 Units Subcutaneous Q4H   insulin aspart  4 Units Subcutaneous Q4H   insulin detemir  8 Units Subcutaneous BID   mouth rinse  15 mL Mouth Rinse q12n4p   midodrine  10 mg Oral TID WC   pantoprazole  40 mg Oral BID   sucralfate  1 g Per Tube TID    Neveah Bang [facility] Pager [phone]
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
## 2021-11-24 NOTE — Progress Notes (Signed)
Patient had small amount of urine out with BM overnight. Bladder scan showed 331 in bladder this AM. Patient denies urinary discomfort. In and out catheters ordered from central supply. Endorsed to oncoming RN for follow up.

## 2021-11-25 DIAGNOSIS — I469 Cardiac arrest, cause unspecified: Secondary | ICD-10-CM | POA: Diagnosis not present

## 2021-11-25 DIAGNOSIS — N179 Acute kidney failure, unspecified: Secondary | ICD-10-CM | POA: Diagnosis not present

## 2021-11-25 DIAGNOSIS — I2602 Saddle embolus of pulmonary artery with acute cor pulmonale: Secondary | ICD-10-CM | POA: Diagnosis not present

## 2021-11-25 DIAGNOSIS — I953 Hypotension of hemodialysis: Secondary | ICD-10-CM | POA: Diagnosis not present

## 2021-11-25 LAB — RENAL FUNCTION PANEL
Albumin: 2.5 g/dL — ABNORMAL LOW (ref 3.5–5.0)
Anion gap: 10 (ref 5–15)
BUN: 66 mg/dL — ABNORMAL HIGH (ref 8–23)
CO2: 24 mmol/L (ref 22–32)
Calcium: 8.4 mg/dL — ABNORMAL LOW (ref 8.9–10.3)
Chloride: 95 mmol/L — ABNORMAL LOW (ref 98–111)
Creatinine, Ser: 5.05 mg/dL — ABNORMAL HIGH (ref 0.44–1.00)
GFR, Estimated: 9 mL/min — ABNORMAL LOW (ref >60)
Glucose, Bld: 154 mg/dL — ABNORMAL HIGH (ref 70–99)
Phosphorus: 4.5 mg/dL (ref 2.5–4.6)
Potassium: 5.1 mmol/L (ref 3.5–5.1)
Sodium: 129 mmol/L — ABNORMAL LOW (ref 135–145)

## 2021-11-25 LAB — CBC
HCT: 23.7 % — ABNORMAL LOW (ref 36.0–46.0)
Hemoglobin: 7.6 g/dL — ABNORMAL LOW (ref 12.0–15.0)
MCH: 28.9 pg (ref 26.0–34.0)
MCHC: 32.1 g/dL (ref 30.0–36.0)
MCV: 90.1 fL (ref 80.0–100.0)
Platelets: 42 10*3/uL — ABNORMAL LOW (ref 150–400)
RBC: 2.63 MIL/uL — ABNORMAL LOW (ref 3.87–5.11)
RDW: 17.8 % — ABNORMAL HIGH (ref 11.5–15.5)
WBC: 11.3 10*3/uL — ABNORMAL HIGH (ref 4.0–10.5)
nRBC: 0 % (ref 0.0–0.2)

## 2021-11-25 LAB — MAGNESIUM: Magnesium: 2.1 mg/dL (ref 1.7–2.4)

## 2021-11-25 LAB — GLUCOSE, CAPILLARY
Glucose-Capillary: 134 mg/dL — ABNORMAL HIGH (ref 70–99)
Glucose-Capillary: 168 mg/dL — ABNORMAL HIGH (ref 70–99)
Glucose-Capillary: 178 mg/dL — ABNORMAL HIGH (ref 70–99)
Glucose-Capillary: 180 mg/dL — ABNORMAL HIGH (ref 70–99)
Glucose-Capillary: 219 mg/dL — ABNORMAL HIGH (ref 70–99)
Glucose-Capillary: 219 mg/dL — ABNORMAL HIGH (ref 70–99)
Glucose-Capillary: 271 mg/dL — ABNORMAL HIGH (ref 70–99)

## 2021-11-25 MED ORDER — CHLORHEXIDINE GLUCONATE CLOTH 2 % EX PADS
6.0000 | MEDICATED_PAD | Freq: Every day | CUTANEOUS | Status: DC
  Administered 2021-11-26 – 2021-12-04 (×8): 6 via TOPICAL
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
## 2021-11-25 NOTE — Progress Notes (Signed)
[name] Progress Note     Assessment/ Plan:   Renal failure secondary to ATN in the setting of cardiac arrest, hypotension, pressors, contrast.  BL CKD3A with BL creatinine in the 1.17-1.3 range in late 2021.     had HD on 1/25 and 1/27-    next planned for Monday-tomorrow.  Has been RRT dependent since 1/4 with min UOP so prospect of renal recovery is becoming less likely -  I discussed this with patient and family at bedside -  now made a little urine so we will see -  TDC in place-  have not made plans for future as of yet ( access, op HD)    VF cardiac arrest--> OOH VF arrest 2/2 massive PE  Massive PE s/p mechanical thrombectomy 1/2 of R and L PA.  Vasc US- no DVT, liver mass noted 10/28/21 CT abd/ pelvis, s/p MRI abd--> incompletely characterized, had MRI with gadolinium 1/20  to reevaluate -- not definitive.      Retroperitoneal bleed/Hemorrhagic shock/Acute blood loss anemia originating from left liver lobe s/p embolization 1/4--> required FFP, plts, Vit K, pRBCs; now stable. Hb in 8s----- 7's now.  Hematology following - they say no ESA. Transfuse when needed   Thrombocytopenia- component of DIC suspected, HIT negative, on bivalirudin and avoiding heparin per heme, getting platelet transfusions PRN, heme involved, suspects ITP, getting Nplate PRN and dex. -  had BMbx -  plts trending up   Acute hypoxic RF- extubated 11/07/21, now weaned to RA-  using midodrine to assist with BP support to achieve UF   Sepsis/ leukocytosis/ aspiration pneumonia- MRI with aspiration PNA.  S/p  CTX and vanc   FEN: off TPN now, on Ensure, prosource and vital 1.5-  have changed the ensure to nepro due to high K content  Bones- phos high-  on TF so difficult with binder, controlling with HD for now-   PTH 129   Subjective:    No more urine recorded -  BUN and crt up and sodium down    Objective:   BP (!) 149/72 (BP Location: Left Arm)    Pulse 88    Temp 98.3 F (36.8 C) (Oral)     Resp 17    Ht 5\' 5"  (1.651 m)    Wt 71.4 kg    SpO2 98%    BMI 26.19 kg/m   Intake/Output Summary (Last 24 hours) at 11/25/2021 0909 Last data filed at 11/25/2021 0354 Gross per 24 hour  Intake 360 ml  Output 1 ml  Net 359 ml   Weight change:   Physical Exam: GEN: NAD,lying in bed HEENT:  sclerae anicteric NECK: Supple, no thyromegaly LUNGS: normal WOB on HFNC, coarse ant, no wheezing or rales CV: Reg rate ABD: soft, nontender EXT: edema resolved  Imaging: No results found.  Labs: BMET Recent Labs  Lab 11/19/21 0516 11/20/21 0410 11/21/21 0121 11/22/21 0232 11/23/21 0518 11/24/21 0309 11/25/21 0405  NA 134* 135 132* 136 129* 133* 129*  K 6.2* 4.6 4.8 4.7 5.5* 4.6 5.1  CL 101 100 96* 95* 94* 96* 95*  CO2 18* 24 20* 24 21* 25 24  GLUCOSE 228* 68* 242* 110* 259* 130* 154*  BUN 143* 59* 92* 60* 86* 43* 66*  CREATININE 5.94* 3.79* 5.24* 3.92* 5.38* 3.69* 5.05*  CALCIUM 8.2* 8.1* 8.4* 8.9 8.5* 8.4* 8.4*  PHOS 10.5* 6.6* 8.7* 5.4* 6.4* 4.3 4.5   CBC Recent Labs  Lab 11/22/21 0232 11/23/21 0518 11/24/21  0309 11/25/21 0405  WBC 12.2* 11.1* 11.2* 11.3*  NEUTROABS  --  8.9*  --   --   HGB 7.5* 8.4* 7.4* 7.6*  HCT 23.0* 24.4* 22.8* 23.7*  MCV 89.1 87.8 89.1 90.1  PLT 18* 25* 26* 42*    Medications:     apixaban  5 mg Oral BID   atorvastatin  20 mg Oral Daily   B-complex with vitamin C  1 tablet Oral Daily   chlorhexidine  15 mL Mouth Rinse BID   Chlorhexidine Gluconate Cloth  6 each Topical Daily   dicyclomine  10 mg Oral TID AC   DULoxetine  20 mg Oral Daily   feeding supplement (NEPRO CARB STEADY)  237 mL Oral TID BM   feeding supplement (PROSource TF)  45 mL Per Tube BID   folic acid  2 mg Oral Daily   insulin aspart  0-9 Units Subcutaneous Q4H   insulin aspart  4 Units Subcutaneous Q4H   insulin detemir  8 Units Subcutaneous BID   mouth rinse  15 mL Mouth Rinse q12n4p   midodrine  10 mg Oral TID WC   pantoprazole  40 mg Oral BID   sucralfate  1 g  Per Tube TID    Bryker Fletchall A [facility] Pager [phone]

## 2021-11-26 ENCOUNTER — Other Ambulatory Visit (HOSPITAL_COMMUNITY): Payer: Self-pay

## 2021-11-26 ENCOUNTER — Inpatient Hospital Stay (HOSPITAL_COMMUNITY): Payer: Medicare PPO

## 2021-11-26 ENCOUNTER — Encounter (HOSPITAL_COMMUNITY): Payer: Self-pay | Admitting: Critical Care Medicine

## 2021-11-26 DIAGNOSIS — I953 Hypotension of hemodialysis: Secondary | ICD-10-CM | POA: Diagnosis not present

## 2021-11-26 DIAGNOSIS — J9601 Acute respiratory failure with hypoxia: Secondary | ICD-10-CM | POA: Diagnosis not present

## 2021-11-26 DIAGNOSIS — J690 Pneumonitis due to inhalation of food and vomit: Secondary | ICD-10-CM | POA: Diagnosis not present

## 2021-11-26 DIAGNOSIS — I469 Cardiac arrest, cause unspecified: Secondary | ICD-10-CM | POA: Diagnosis not present

## 2021-11-26 DIAGNOSIS — N179 Acute kidney failure, unspecified: Secondary | ICD-10-CM | POA: Diagnosis not present

## 2021-11-26 DIAGNOSIS — I2602 Saddle embolus of pulmonary artery with acute cor pulmonale: Secondary | ICD-10-CM | POA: Diagnosis not present

## 2021-11-26 LAB — COMPREHENSIVE METABOLIC PANEL
ALT: 14 U/L (ref 0–44)
AST: 28 U/L (ref 15–41)
Albumin: 2.5 g/dL — ABNORMAL LOW (ref 3.5–5.0)
Alkaline Phosphatase: 117 U/L (ref 38–126)
Anion gap: 12 (ref 5–15)
BUN: 86 mg/dL — ABNORMAL HIGH (ref 8–23)
CO2: 23 mmol/L (ref 22–32)
Calcium: 8.8 mg/dL — ABNORMAL LOW (ref 8.9–10.3)
Chloride: 94 mmol/L — ABNORMAL LOW (ref 98–111)
Creatinine, Ser: 6.5 mg/dL — ABNORMAL HIGH (ref 0.44–1.00)
GFR, Estimated: 6 mL/min — ABNORMAL LOW (ref >60)
Glucose, Bld: 100 mg/dL — ABNORMAL HIGH (ref 70–99)
Potassium: 5.5 mmol/L — ABNORMAL HIGH (ref 3.5–5.1)
Sodium: 129 mmol/L — ABNORMAL LOW (ref 135–145)
Total Bilirubin: 1 mg/dL (ref 0.3–1.2)
Total Protein: 6.2 g/dL — ABNORMAL LOW (ref 6.5–8.1)

## 2021-11-26 LAB — CBC
HCT: 23.5 % — ABNORMAL LOW (ref 36.0–46.0)
Hemoglobin: 7.6 g/dL — ABNORMAL LOW (ref 12.0–15.0)
MCH: 28.4 pg (ref 26.0–34.0)
MCHC: 32.3 g/dL (ref 30.0–36.0)
MCV: 87.7 fL (ref 80.0–100.0)
Platelets: 58 10*3/uL — ABNORMAL LOW (ref 150–400)
RBC: 2.68 MIL/uL — ABNORMAL LOW (ref 3.87–5.11)
RDW: 17.3 % — ABNORMAL HIGH (ref 11.5–15.5)
WBC: 12.1 10*3/uL — ABNORMAL HIGH (ref 4.0–10.5)
nRBC: 0 % (ref 0.0–0.2)

## 2021-11-26 LAB — GLUCOSE, CAPILLARY
Glucose-Capillary: 102 mg/dL — ABNORMAL HIGH (ref 70–99)
Glucose-Capillary: 115 mg/dL — ABNORMAL HIGH (ref 70–99)
Glucose-Capillary: 115 mg/dL — ABNORMAL HIGH (ref 70–99)
Glucose-Capillary: 71 mg/dL (ref 70–99)
Glucose-Capillary: 141 mg/dL — ABNORMAL HIGH (ref 70–99)

## 2021-11-26 LAB — RENAL FUNCTION PANEL
Albumin: 2.6 g/dL — ABNORMAL LOW (ref 3.5–5.0)
Anion gap: 13 (ref 5–15)
BUN: 85 mg/dL — ABNORMAL HIGH (ref 8–23)
CO2: 23 mmol/L (ref 22–32)
Calcium: 8.8 mg/dL — ABNORMAL LOW (ref 8.9–10.3)
Chloride: 94 mmol/L — ABNORMAL LOW (ref 98–111)
Creatinine, Ser: 6.44 mg/dL — ABNORMAL HIGH (ref 0.44–1.00)
GFR, Estimated: 6 mL/min — ABNORMAL LOW (ref >60)
Glucose, Bld: 99 mg/dL (ref 70–99)
Phosphorus: 4.4 mg/dL (ref 2.5–4.6)
Potassium: 5.5 mmol/L — ABNORMAL HIGH (ref 3.5–5.1)
Sodium: 130 mmol/L — ABNORMAL LOW (ref 135–145)

## 2021-11-26 LAB — MAGNESIUM: Magnesium: 2.1 mg/dL (ref 1.7–2.4)

## 2021-11-26 MED ORDER — LIDOCAINE HCL (PF) 1 % IJ SOLN: 5.0000 mL | INTRAMUSCULAR | Status: DC | PRN

## 2021-11-26 MED ORDER — FENTANYL CITRATE (PF) 100 MCG/2ML IJ SOLN
INTRAMUSCULAR | Status: AC | PRN
  Administered 2021-11-26: 25 ug via INTRAVENOUS

## 2021-11-26 MED ORDER — HEPARIN SODIUM (PORCINE) 1000 UNIT/ML IJ SOLN
INTRAMUSCULAR | Status: AC
  Filled 2021-11-26: qty 3

## 2021-11-26 MED ORDER — LIDOCAINE HCL 1 % IJ SOLN
INTRAMUSCULAR | Status: AC
  Administered 2021-11-26: 10 mL via INTRADERMAL
  Filled 2021-11-26: qty 10

## 2021-11-26 MED ORDER — FENTANYL CITRATE (PF) 100 MCG/2ML IJ SOLN
INTRAMUSCULAR | Status: AC
  Filled 2021-11-26: qty 2

## 2021-11-26 MED ORDER — PENTAFLUOROPROP-TETRAFLUOROETH EX AERO: 1.0000 "application " | INHALATION_SPRAY | CUTANEOUS | Status: DC | PRN

## 2021-11-26 MED ORDER — LIDOCAINE-PRILOCAINE 2.5-2.5 % EX CREA: 1.0000 "application " | TOPICAL_CREAM | CUTANEOUS | Status: DC | PRN

## 2021-11-26 MED ORDER — SODIUM CHLORIDE 0.9 % IV SOLN: 100.0000 mL | INTRAVENOUS | Status: DC | PRN

## 2021-11-26 MED ORDER — MIDAZOLAM HCL 2 MG/2ML IJ SOLN
INTRAMUSCULAR | Status: AC
  Filled 2021-11-26: qty 2

## 2021-11-26 MED ORDER — ALTEPLASE 2 MG IJ SOLR: 2.0000 mg | Freq: Once | INTRAMUSCULAR | Status: DC | PRN

## 2021-11-26 MED ORDER — MIDAZOLAM HCL 2 MG/2ML IJ SOLN
INTRAMUSCULAR | Status: AC | PRN
  Administered 2021-11-26 (×2): .5 mg via INTRAVENOUS

## 2021-11-26 MED ORDER — VITAL 1.5 CAL PO LIQD
1000.0000 mL | ORAL | Status: DC
  Administered 2021-11-26 – 2021-12-02 (×9): 1000 mL
  Filled 2021-11-26 (×8): qty 1000

## 2021-11-26 NOTE — Care Management Important Message (Signed)
Important Message  Patient Details  Name: [name] MRN: [id number] Date of Birth: Aug 31, 1951   Medicare Important Message Given:  Yes     Shelda Altes 11/26/2021, 8:05 AM
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
## 2021-11-26 NOTE — Progress Notes (Signed)
Physical Therapy Treatment Patient Details Name: [name] MRN: [id number] DOB: [birth year] Today's Date: 11/26/2021   History of Present Illness 71 y.o. female admitted 10/28/21 after cardiac arrest requiring 20-min CPR for ROSC; ETT in the field. Pt had 2x additional codes in ED requiring CPR for 4 and 2-min. Workup for STEMI, cardiogenic shock, large saddle PE, RUQ bleeding, 2 rib fxs. S/p PE thrombectomy 1/2. Pt with intraperitoneal hemorrhage s/p embolization 1/5. ETT 1/1-1/10. S/p L femoral non-tunneled HD catheter placed 1/4. CRRT 1/4-1/11; trialled iHD 1/12; CRRT again 1/15-1/18. Began iHD again 1/20. S/p RIJ tunneled HD catheter placed 1/24. Pt with persistent thrombocytopenia; plan for bone marrow biopsy 1/27. PMH includes fibromyalgia, HTN, DM2, CKD, GERD, asthma.    PT Comments     Pt progressing slowly towards her physical therapy goals; agreeable to session with gentle and positive reinforcement. Pt requiring two person min-mod assist for functional mobility. Able to stand from edge of bed to a walker, but limited in attempting pre gait training due to fear of falling. Pt noted to have decreased R ankle ROM and edema. Performed AROM and stretching; ordered PRAFO boot for positioning.     Recommendations for follow up therapy are one component of a multi-disciplinary discharge planning process, led by the attending physician.  Recommendations may be updated based on patient status, additional functional criteria and insurance authorization.  Follow Up Recommendations  Skilled nursing-short term rehab (<3 hours/day)     Assistance Recommended at Discharge Frequent or constant Supervision/Assistance  Patient can return home with the following Two people to help with walking and/or transfers;Assistance with cooking/housework;Assist for transportation;Help with stairs or ramp for entrance;A lot of help with bathing/dressing/bathroom   Equipment Recommendations  Rolling walker (2  wheels);Wheelchair (measurements PT);Wheelchair cushion (measurements PT);Hospital bed    Recommendations for Other Services       Precautions / Restrictions Precautions Precautions: Fall;Other (comment) Precaution Comments: cortrak Restrictions Weight Bearing Restrictions: No     Mobility  Bed Mobility Overal bed mobility: Needs Assistance Bed Mobility: Supine to Sit, Sit to Supine, Rolling Rolling: Min guard   Supine to sit: HOB elevated, Min assist Sit to supine: Min assist   General bed mobility comments: elevating HOB to support trunk and then pt bringing legs towards EOB. Min A to bring L hip more towards EOB. in return to supine, Min A for trunk    Transfers Overall transfer level: Needs assistance Equipment used: Rolling walker (2 wheels) Transfers: Sit to/from Stand Sit to Stand: Min assist, +2 physical assistance, Mod assist, +2 safety/equipment, From elevated surface           General transfer comment: Min A + 2 for power up from edge of bed x 2. Second trial, requiring modA +2    Ambulation/Gait                   Stairs             Wheelchair Mobility    Modified Rankin (Stroke Patients Only)       Balance Overall balance assessment: Needs assistance Sitting-balance support: No upper extremity supported, Feet supported Sitting balance-Leahy Scale: Fair     Standing balance support: Bilateral upper extremity supported, Reliant on assistive device for balance Standing balance-Leahy Scale: Poor                              Cognition Arousal/Alertness: Awake/alert Behavior During Therapy: Flat affect  Overall Cognitive Status: Impaired/Different from baseline Area of Impairment: Attention, Following commands, Safety/judgement, Awareness, Problem solving                   Current Attention Level: Selective   Following Commands: Follows one step commands with increased time Safety/Judgement: Decreased  awareness of deficits Awareness: Emergent Problem Solving: Slow processing, Decreased initiation, Requires verbal cues General Comments: Continues to requiting significant time for processing. More agreeable to therapy this session. self limiting and requiring increased encouragement.        Exercises General Exercises - Upper Extremity Shoulder Flexion:  (hold 5 sec at 90s flexion; rest break between) Elbow Flexion:  (x3) Elbow Extension:  (x3) General Exercises - Lower Extremity Ankle Circles/Pumps: Right, Left, 10 reps, AROM, Seated Long Arc Quad: AROM, Left, Right, 5 reps, Seated Other Exercises Other Exercises: RLE dorsiflexion stretch hold x 1 minute    General Comments General comments (skin integrity, edema, etc.): VSS      Pertinent Vitals/Pain Pain Assessment Pain Assessment: Faces Faces Pain Scale: Hurts even more Pain Location: R ankle Pain Descriptors / Indicators: Tightness, Grimacing, Guarding, Moaning Pain Intervention(s): Monitored during session    Home Living                          Prior Function            PT Goals (current goals can now be found in the care plan section) Acute Rehab PT Goals Patient Stated Goal: get stronger Potential to Achieve Goals: Fair Progress towards PT goals: Progressing toward goals    Frequency    Min 2X/week      PT Plan Current plan remains appropriate    Co-evaluation PT/OT/SLP Co-Evaluation/Treatment: Yes Reason for Co-Treatment: For patient/therapist safety;To address functional/ADL transfers;Other (comment) (pt leaving for multiple procedures; decreased participation in therapies)   OT goals addressed during session: ADL's and self-care      AM-PAC PT "6 Clicks" Mobility   Outcome Measure  Help needed turning from your back to your side while in a flat bed without using bedrails?: A Little Help needed moving from lying on your back to sitting on the side of a flat bed without using  bedrails?: A Little Help needed moving to and from a bed to a chair (including a wheelchair)?: A Lot Help needed standing up from a chair using your arms (e.g., wheelchair or bedside chair)?: A Lot Help needed to walk in hospital room?: Total Help needed climbing 3-5 steps with a railing? : Total 6 Click Score: 12    End of Session Equipment Utilized During Treatment: Gait belt Activity Tolerance: Patient limited by fatigue Patient left: in bed;with call bell/phone within reach;with bed alarm set (leaving for procedure) Nurse Communication: Mobility status PT Visit Diagnosis: Unsteadiness on feet (R26.81);Other abnormalities of gait and mobility (R26.89);Muscle weakness (generalized) (M62.81)     Time: 1660-6301 PT Time Calculation (min) (ACUTE ONLY): 34 min  Charges:  $Therapeutic Activity: 8-22 mins                     Wyona Almas, PT, DPT Acute Rehabilitation Services Pager [phone] Office [phone]    Deno Etienne 11/26/2021, 10:28 AM

## 2021-11-26 NOTE — Progress Notes (Signed)
Orthopedic Tech Progress Note Patient Details:  [name] August 31, 1951 [id number]  Ortho Devices Type of Ortho Device: Prafo boot/shoe Ortho Device/Splint Location: RLE Ortho Device/Splint Interventions: Ordered, Application, Adjustment   Post Interventions Patient Tolerated: Well Instructions Provided: Care of device  [name] 11/26/2021, 11:44 AM

## 2021-11-26 NOTE — Progress Notes (Signed)
[place] for apixaban Indication: pulmonary embolus  Allergies  Allergen Reactions   Aspirin Nausea And Vomiting    Other reaction(s): Unknown   Benadryl [Diphenhydramine]     Can only take dye free. States the dye causes itching.   Darvon [Propoxyphene] Itching    Can only during the day. Night time makes her itch   Hydrocodone Bit-Homatrop Mbr Itching   Metformin     Other reaction(s): GI   Other     Other reaction(s): Unknown. States reaction was to nasal spray that caused pupils to shrink   Pentazocine     nervous Other reaction(s): jitters   Percocet [Oxycodone-Acetaminophen]     itching   Sulfa Antibiotics Hives   Tetracaine Hcl     Other reaction(s): Unknown. Thinks caused itching.   Tetracyclines & Related Hives   Tramadol     Other reaction(s): itch    Patient Measurements: Height: 5\' 5"  (165.1 cm) Weight: 71.4 kg (157 lb 6.5 oz) IBW/kg (Calculated) : 57  Vital Signs: Temp: 98 F (36.7 C) (01/30 0303) Temp Source: Oral (01/30 0823) BP: 109/78 (01/30 0823) Pulse Rate: 84 (01/30 0823)  Labs: Recent Labs    11/24/21 0309 11/25/21 0405 11/26/21 0615  HGB 7.4* 7.6* 7.6*  HCT 22.8* 23.7* 23.5*  PLT 26* 42* 58*  CREATININE 3.69* 5.05* 6.50*   6.44*     Estimated Creatinine Clearance: 7.7 mL/min (A) (by C-G formula based on SCr of 6.44 mg/dL (H)).   Assessment: 71 yo female with prolonged OOH VF arrest d/t massive PE, s/p thrombectomy, low pltc nadir < 5, negative HIT, was on bivalirudin. Transitioned to apixaban on 1/24, s/p IR for Tunneled HD cath and removal of old one. No overt bleeding reported, hgb stable 7.6 and pltc improved to 58. Had Nplate 1/16, decadron x 4 days. Heme/Onc following, they say No ESA. 1/30 having CT guided aspirate and core biopsy of left iliac bone.  AKI in setting of shock, CRRT >> HD (1/25, 1/27, next planned 1/30)  Goal of Therapy: Monitor platelets by anticoagulation protocol: Yes    Plan:  Continue Apixaban 5mg  PO BID Monitor for s/s bleeding  Pharmacy will sign off and continue to follow peripherally, re-consult as needed.   Thank you for allowing [name] to participate in this patients care. Jens Som, PharmD 11/26/2021 10:02 AM  **Pharmacist phone directory can be found on [email or web address] listed under [place]**

## 2021-11-26 NOTE — Progress Notes (Addendum)
Bladder scan performed. 682 ml present. Patient refused In and Out Cath at this time. Patient educated on importance of urinating at this time and if unable to urinate then an In and Out Cath is needed. Patient is currently on bedpan. Daughter present and agrees with plan. Oncoming nurse notified.  Daymon Larsen, RN

## 2021-11-26 NOTE — Progress Notes (Signed)
Nutrition Follow-up  DOCUMENTATION CODES:   Severe malnutrition in context of acute illness/injury  INTERVENTION:   Continue Nepro Shake po TID, each supplement provides 425 kcal and 19 grams protein.  Encourage intake of meals and supplements.   Continue TF via Cortrak: Vital 1.5 at 75 ml/h x 14 hours nocturnally (6pm-8am) to provide 1575 kcal, 71 gm protein, 802 ml free water daily. This meets 80% of estimated calorie needs and 71% of minimum estimated protein needs.  D/C Prosource TF.  Calorie count completed, will d/c.  NUTRITION DIAGNOSIS:   Severe Malnutrition related to acute illness as evidenced by moderate muscle depletion, energy intake < or equal to 50% for > or equal to 5 days, mild fat depletion.  Ongoing   GOAL:   Patient will meet greater than or equal to 90% of their needs  Progressing  MONITOR:   Diet advancement, Labs, Weight trends, Other (Comment)  REASON FOR ASSESSMENT:   Ventilator    ASSESSMENT:   71 yo female admitted with acute encephalopathy post OOH prolonged V.fib arrest with multiorgan failure with massive saddle PE and DIC, AKI. PMH includes DM, HTN, HLD, GERD, anxiety, depression, fibromyalgia  Patient out of room for bone marrow biopsy this morning. Spoke with patient's daughter in room.  Patient continues to eat poorly. Daughter brought some broccoli cheese soup from [employer] over the weekend and patient ate it all. She also drank half of a sweet tea.  Patient is NPO this morning for bone marrow biopsy. TF also on hold. TF orders via Cortrak: Vital 1.5 at 30 ml/h Prosource TF 45 ml BID, providing 1160 kcal, 71 gm protein, and 550 ml free water daily.   Remains on a dysphagia 2 diet with thin liquids.  Meal intakes recorded at 10-25%.   Calorie count: 1/28 dinner ([employer] meal): 285 kcal, 8 gm protein No other meal tickets were saved for calorie count.  Patient is not meeting nutrition needs. She is eating 10-25% of meals.  Suspect she is consuming </= 20% of nutrition needs PO.   Will change TF to a nocturnal regimen and provide ~80% of nutrition needs.  Labs reviewed. Na 130, Phos 4.4 WNL, PTH 129 (H) CBG: 102-115  Medications reviewed and include B-complex with vitamin C, folic acid, Novolog, Levemir, Protonix, Carafate.  Admission weight 77.1 kg Current weight 71.4 kg (1/28)  Last HD 1/27, 1 L removed. Next HD scheduled for today.  Diet Order:   Diet Order             DIET DYS 2 Room service appropriate? Yes; Fluid consistency: Thin  Diet effective now                   EDUCATION NEEDS:   Not appropriate for education at this time  Skin:  Skin Assessment: Skin Integrity Issues: Skin Integrity Issues:: Other (Comment) Other: MASD to buttocks; skin tear to buttocks and R elbow  Last BM:  1/30 type 6  Height:   Ht Readings from Last 1 Encounters:  10/28/21 5' 5"  (1.651 m)    Weight:   Wt Readings from Last 1 Encounters:  11/24/21 71.4 kg    BMI:  Body mass index is 26.19 kg/m.  Estimated Nutritional Needs:   Kcal:  1950-2150 kcals  Protein:  100-125 g  Fluid:  >/= 1.8 L    Lucas Mallow RD, LDN, CNSC Please refer to [facility] for contact information.

## 2021-11-26 NOTE — Progress Notes (Signed)
Unfortunately, the bone marrow cannot be done on Friday.  It sounds like she will have this done today.  Her platelet count is 42,000.  That was done yesterday.  There are no labs yet back today.  Hopefully this is on the way up..  She is still on hemodialysis.  She has had no obvious bleeding.  She is on Eliquis for the thromboembolic disease.  She still has a tube feeds.  Hopefully, she will be able to eat and taking enough nourishment so the feeding tube can be taken out.   She has had no fever.  Her blood pressure is doing okay.  This morning her blood pressure is 150/45.  Again, we will have to see what the bone marrow test shows.  I still think that we should do this.  She still has this abnormality in the liver.  We are still not sure what this is.  At some point, she may need to have another MRI of the liver done.  I suspect that she probably will need to have a liver biopsy done by virtue of the report with this peripheral enhancement.  I know this is incredibly difficult.  She is really getting fantastic care from all staff up on 4 E.  [name], MD  Psalms [phone]

## 2021-11-26 NOTE — Progress Notes (Signed)
[name] Progress Note     Assessment/ Plan:   Renal failure secondary to ATN in the setting of cardiac arrest, hypotension, pressors, contrast.  BL CKD3A with BL creatinine in the 1.1-1.3 range in late 2021.     MWF schedule right now.  Has been RRT dependent since 1/4 with min UOP so prospect of renal recovery is becoming less likely -this was discussed with the patient and the family on 1/29.  Because they noted she made some urine output or permanent plans for outpatient dialysis and permanent access had been delayed.  We will see what her labs are before dialysis today but may need to broach subject of access creation if she does not improve soon.    VF cardiac arrest--> OOH VF arrest 2/2 massive PE  Massive PE s/p mechanical thrombectomy 1/2 of R and L PA.  Vasc US- no DVT, liver mass noted 10/28/21 CT abd/ pelvis, s/p MRI abd--> incompletely characterized, had MRI with gadolinium 1/20  to reevaluate -- not definitive.      Retroperitoneal bleed/Hemorrhagic shock/Acute blood loss anemia originating from left liver lobe s/p embolization 1/4--> required FFP, plts, Vit K, pRBCs; now stable. Hb in 8s----- 7's now.  Hematology following - they say no ESA. Transfuse when needed   Thrombocytopenia- component of DIC suspected, HIT negative, on bivalirudin and avoiding heparin per heme, getting platelet transfusions PRN, heme involved, suspects ITP, getting Nplate PRN and dex. -  work up and mgmt per heme  Acute hypoxic RF- extubated 11/07/21, now weaned to RA-  using midodrine to assist with BP support to achieve UF   Sepsis/ leukocytosis/ aspiration pneumonia- MRI with aspiration PNA.  S/p  CTX and vanc   FEN: off TPN now, on Ensure, prosource and vital 1.5-  have changed the ensure to nepro due to high K content  Bones- phos high-  on TF so difficult with binder, controlling with HD for now-   PTH 129   Subjective:    No urine output recorded.  Planning for dialysis  today.  No complaints   Objective:   BP (!) 165/85    Pulse 74    Temp 98 F (36.7 C) (Oral)    Resp 16    Ht _0  (1.651 m)    Wt 71.4 kg    SpO2 97%    BMI 26.19 kg/m   Intake/Output Summary (Last 24 hours) at 11/26/2021 1039 Last data filed at 11/25/2021 2000 Gross per 24 hour  Intake 800 ml  Output 0 ml  Net 800 ml   Weight change:   Physical Exam: GEN: NAD,lying in bed HEENT:  sclerae anicteric, no nasal discharge NECK: Supple, no thyromegaly LUNGS: Normal work of breathing, coarse breath sounds bilaterally CV: Reg rate, no audible murmur ABD: soft, nontender EXT: Warm and well perfused, trace edema  Imaging: CT BONE MARROW BIOPSY & ASPIRATION  Result Date: 11/26/2021 INDICATION: 71 year old female with history of thrombocytopenia. EXAM: CT-GUIDED BONE MARROW BIOPSY AND ASPIRATION MEDICATIONS: None ANESTHESIA/SEDATION: Fentanyl 25 mcg IV; Versed 1 mg IV Sedation Time: 10 minutes; The patient was continuously monitored during the procedure by the interventional radiology nurse under my direct supervision. COMPLICATIONS: None immediate. PROCEDURE: Informed consent was obtained from the patient following an explanation of the procedure, risks, benefits and alternatives. The patient understands, agrees and consents for the procedure. All questions were addressed. A time out was performed prior to the initiation of the procedure. The patient was positioned prone and non-contrast  localization CT was performed of the pelvis to demonstrate the iliac marrow spaces. The operative site was prepped and draped in the usual sterile fashion. Under sterile conditions and local anesthesia, a 22 gauge spinal needle was utilized for procedural planning. Next, an 11 gauge coaxial bone biopsy needle was advanced into the left iliac marrow space. Needle position was confirmed with CT imaging. Initially, a bone marrow aspiration was performed. Next, a bone marrow biopsy was obtained with the 11 gauge outer  bone marrow device. Samples were prepared with the cytotechnologist and deemed adequate. The needle was removed and superficial hemostasis was obtained with manual compression. A dressing was applied. The patient tolerated the procedure well without immediate post procedural complication. IMPRESSION: Successful CT guided left iliac bone marrow aspiration and core biopsy. Ruthann Cancer, MD [facility] Electronically Signed   By: Ruthann Cancer M.D.   On: 11/26/2021 10:28    Labs: BMET Recent Labs  Lab 11/20/21 0410 11/21/21 0121 11/22/21 0232 11/23/21 0518 11/24/21 0309 11/25/21 0405 11/26/21 0615  NA 135 132* 136 129* 133* 129* 129*   130*  K 4.6 4.8 4.7 5.5* 4.6 5.1 5.5*   5.5*  CL 100 96* 95* 94* 96* 95* 94*   94*  CO2 24 20* 24 21* _0 GLUCOSE 68* 242* 110* 259* 130* 154* 100*   99  BUN 59* 92* 60* 86* 43* 66* 86*   85*  CREATININE 3.79* 5.24* 3.92* 5.38* 3.69* 5.05* 6.50*   6.44*  CALCIUM 8.1* 8.4* 8.9 8.5* 8.4* 8.4* 8.8*   8.8*  PHOS 6.6* 8.7* 5.4* 6.4* 4.3 4.5 4.4   CBC Recent Labs  Lab 11/23/21 0518 11/24/21 0309 11/25/21 0405 11/26/21 0615  WBC 11.1* 11.2* 11.3* 12.1*  NEUTROABS 8.9*  --   --   --   HGB 8.4* 7.4* 7.6* 7.6*  HCT 24.4* 22.8* 23.7* 23.5*  MCV 87.8 89.1 90.1 87.7  PLT 25* 26* 42* 58*    Medications:     apixaban  5 mg Oral BID   atorvastatin  20 mg Oral Daily   B-complex with vitamin C  1 tablet Oral Daily   chlorhexidine  15 mL Mouth Rinse BID   Chlorhexidine Gluconate Cloth  6 each Topical Daily   Chlorhexidine Gluconate Cloth  6 each Topical Q0600   dicyclomine  10 mg Oral TID AC   DULoxetine  20 mg Oral Daily   feeding supplement (NEPRO CARB STEADY)  237 mL Oral TID BM   feeding supplement (PROSource TF)  45 mL Per Tube BID   folic acid  2 mg Oral Daily   insulin aspart  0-9 Units Subcutaneous Q4H   insulin aspart  4 Units Subcutaneous Q4H   insulin detemir  8 Units  Subcutaneous BID   mouth rinse  15 mL Mouth Rinse q12n4p   midodrine  10 mg Oral TID WC   pantoprazole  40 mg Oral BID   sucralfate  1 g Per Tube TID    [facility]

## 2021-11-26 NOTE — Progress Notes (Signed)
Palliative Care Progress Note, Assessment & Plan   Patient Name: [name]       Date: 11/26/2021 DOB: 1950-11-07  Age: 71 y.o. MRN#: [id number] Attending Physician: Aline August, MD Primary Care Physician: [name], MD Admit Date: 10/28/2021  Reason for Consultation/Follow-up: Establishing goals of care  Subjective: Patient is not present in room.  Patient's daughter [name] is present.  HPI: As per my NP Larach's note of 1/26, 71 y.o. female   admitted on 10/28/2021 with a history of fibromyalgia, asthma, HTN who presented after a presyncopal episode at home where her daughter lowered her to the ground. She regained some consciousness and told her daughter she felt like her BG was low. She lost consciousness before EMS arrived.    When EMS arrived she was in VF and had 20 min of CPR. She received shocks, amiodarone, epinephrine. She coded twice in the ED, 4 min and 2 min.  Initial EKG junctional rhythm, concerning for STEMI, but due to electrolyte abnormalities and acidosis, was not a candidate for heart catherization at this time. Possible seizure in the ED   Complicated hospitalization.  Today is day 28 of this hospital stay.  Summary of counseling/coordination of care: After reviewing the patient's chart, I attempted to assess the patient at bedside.  Upon entering the room patient was not present.  Patient's daughter [name] shared she did not know if her mother was in hemodialysis or getting her bone marrow biopsy.  I confirmed via epic the patient had just been transferred to CT for biopsy.  From chart review, nephrology suggest consideration of access creation for outpatient HD if patient does not improve.  Continue discussions with the patient need to be had regarding goals of care.   However, patient not present for these discussions currently.  Therapeutic silence and active listening offered to [name] to share her thoughts and emotions regarding her mother's current health status.  [name] is concerned about patient's right ankle. She shares her mother injured it during her fall in the beginning of this month and is concerned that since her mother has pain and difficulty bearing weight that this will prevent her from being able to be placed in CIR.    [name] is asking what other options for rehab pt has.  I shared that PT/OT recommendations will be reviewed and TOC will coordinate with primary team regarding appropriate and safe disposition. [name] appreciated this clarification.   I shared that my colleague Wadie Lessen will be returning and plans to round on Ms. Dombek tomorrow, 1/31.  Questions and concerns were addressed.    Code Status: DNR  Prognosis: Unable to determine  Discharge Planning: To Be Determined  Recommendations/Plan: Treat the treatable Awaiting results of biopsy  Care plan was discussed with patient's daughter [name]  Unable to perform PE      Palliative Assessment/Data: 50%    Total Time 25 minutes  Greater than 50%  of this time was spent counseling and coordinating care related to the above assessment and plan.  Thank you for allowing the Palliative Medicine Team to assist in the care of this patient.  [place] Ilsa Iha, FNP-BC Palliative Medicine Team Team Phone #  [phone] °  °

## 2021-11-26 NOTE — Progress Notes (Signed)
Inpatient Rehabilitation Admissions Coordinator   I met with pt's daughter at bedside. I explained that I continue to follow her progress in case she develops tolerance for more intensive therapy venue for CIR is their preference.   Danne Baxter, RN, MSN Rehab Admissions Coordinator [phone] 11/26/2021 11:19 AM

## 2021-11-26 NOTE — Procedures (Addendum)
Interventional Radiology Procedure Note  Procedure: CT guided aspirate and core biopsy of left iliac bone  Complications: None  Recommendations: - Bedrest supine x 1 hrs - Follow biopsy results   Ruthann Cancer, MD

## 2021-11-26 NOTE — Progress Notes (Addendum)
Occupational Therapy Treatment Patient Details Name: [name] MRN: [id number] DOB: 1951/04/18 Today's Date: 11/26/2021   History of present illness 71 y.o. female admitted 10/28/21 after cardiac arrest requiring 20-min CPR for ROSC; ETT in the field. Pt had 2x additional codes in ED requiring CPR for 4 and 2-min. Workup for STEMI, cardiogenic shock, large saddle PE, RUQ bleeding, 2 rib fxs. S/p PE thrombectomy 1/2. Pt with intraperitoneal hemorrhage s/p embolization 1/5. ETT 1/1-1/10. S/p L femoral non-tunneled HD catheter placed 1/4. CRRT 1/4-1/11; trialled iHD 1/12; CRRT again 1/15-1/18. Began iHD again 1/20. S/p RIJ tunneled HD catheter placed 1/24. Pt with persistent thrombocytopenia; plan for bone marrow biopsy 1/27. PMH includes fibromyalgia, HTN, DM2, CKD, GERD, asthma.   OT comments  Pt slowly progressing towards established goals. Continues to present with decreased activity tolerance and strength. While sitting at EOB, pt performing grooming and LE exercises. Pt performing sit<>stand with Min-Mod A +2 and RW. Pt fatigues quickly. Continue to recommend dc to SNF and will continue to follow acutely as admitted. Updated OT goals.    Recommendations for follow up therapy are one component of a multi-disciplinary discharge planning process, led by the attending physician.  Recommendations may be updated based on patient status, additional functional criteria and insurance authorization.    Follow Up Recommendations  Skilled nursing-short term rehab (<3 hours/day)    Assistance Recommended at Discharge Frequent or constant Supervision/Assistance  Patient can return home with the following  Two people to help with walking and/or transfers;A lot of help with bathing/dressing/bathroom;Assistance with cooking/housework   Equipment Recommendations  Other (comment) (TBD next venue)    Recommendations for Other Services Rehab consult    Precautions / Restrictions Precautions Precautions:  Fall;Other (comment) Precaution Comments: cortrak       Mobility Bed Mobility Overal bed mobility: Needs Assistance Bed Mobility: Supine to Sit, Sit to Supine, Rolling Rolling: Min guard   Supine to sit: Min guard, HOB elevated, Min assist Sit to supine: Min assist   General bed mobility comments: Min Guard A for safety; elevating HOB to suport trunk and then pt bringing legs towards EOB. Min A to bring L hip more towards EOB. in return to supine, Min A for trunk    Transfers Overall transfer level: Needs assistance Equipment used: Rolling walker (2 wheels) Transfers: Sit to/from Stand Sit to Stand: Min assist, +2 physical assistance, Mod assist, +2 safety/equipment, From elevated surface           General transfer comment: Min A for power up form EOB; overall Mod A +2 for balance and management of RW.     Balance Overall balance assessment: Needs assistance Sitting-balance support: No upper extremity supported, Feet supported Sitting balance-Leahy Scale: Fair     Standing balance support: Bilateral upper extremity supported, Reliant on assistive device for balance Standing balance-Leahy Scale: Poor                             ADL either performed or assessed with clinical judgement   ADL Overall ADL's : Needs assistance/impaired     Grooming: Sitting;Wash/dry face;Set up;Supervision/safety               Lower Body Dressing: Bed level;Maximal assistance Lower Body Dressing Details (indicate cue type and reason): don socks             Functional mobility during ADLs: Moderate assistance;+2 for physical assistance;+2 for safety/equipment;Rolling walker (2 wheels) General ADL  Comments: Sit<>stand from EOB. Mod A +2 for power up and controling RW as pt insisting on pulling up on walker vs pushing from EOB. Performing second sit<>stand to scoot higher in bed; however, pt with limited effort and barely achieving hips off bed    Extremity/Trunk  Assessment Upper Extremity Assessment Upper Extremity Assessment: Generalized weakness RUE Deficits / Details: hypersensitive   Lower Extremity Assessment Lower Extremity Assessment: Defer to PT evaluation;RLE deficits/detail RLE Deficits / Details: hypersensitive to touch, partial ROM against gravity        Vision       Perception     Praxis      Cognition Arousal/Alertness: Awake/alert Behavior During Therapy: Flat affect Overall Cognitive Status: Impaired/Different from baseline Area of Impairment: Attention, Following commands, Safety/judgement, Awareness, Problem solving                   Current Attention Level: Selective   Following Commands: Follows one step commands with increased time Safety/Judgement: Decreased awareness of deficits Awareness: Emergent Problem Solving: Slow processing, Decreased initiation, Requires verbal cues General Comments: Continues to requiting significant time for processing. More agreeable to therapy this session. self limiting and requiring increased encouragement.        Exercises Exercises: General Lower Extremity General Exercises - Upper Extremity Shoulder Flexion:  (hold 5 sec at 90s flexion; rest break between) Elbow Flexion:  (x3) Elbow Extension:  (x3) General Exercises - Lower Extremity Ankle Circles/Pumps: Right, Left, 10 reps, AROM, Seated Long Arc Quad: AROM, Left, Right, 5 reps, Seated Other Exercises Other Exercises: RLE dorsiflexion stretch; hold 20 sec; x5    Shoulder Instructions       General Comments VSS    Pertinent Vitals/ Pain       Pain Assessment Pain Assessment: Faces Faces Pain Scale: Hurts even more Pain Location: R ankle Pain Descriptors / Indicators: Tightness, Grimacing, Guarding, Moaning Pain Intervention(s): Monitored during session, Repositioned, Limited activity within patient's tolerance  Home Living                                          Prior  Functioning/Environment              Frequency  Min 2X/week        Progress Toward Goals  OT Goals(current goals can now be found in the care plan section)  Progress towards OT goals: Progressing toward goals  Acute Rehab OT Goals OT Goal Formulation: With patient/family Time For Goal Achievement: 12/10/21 Potential to Achieve Goals: Good ADL Goals Pt Will Perform Grooming: with set-up;with supervision;sitting Pt Will Perform Upper Body Dressing: with set-up;with supervision;sitting Pt Will Transfer to Toilet: with mod assist;stand pivot transfer;bedside commode Additional ADL Goal #1: Pt will tolerate sitting at EOB with Min Guard A in preparation for ADLs Additional ADL Goal #2: Pt will demonstrate increased activity tolerance to perform four grooming tasks in sitting with Supervision  Plan Discharge plan needs to be updated    Co-evaluation    PT/OT/SLP Co-Evaluation/Treatment: Yes Reason for Co-Treatment: For patient/therapist safety;To address functional/ADL transfers   OT goals addressed during session: ADL's and self-care      AM-PAC OT "6 Clicks" Daily Activity     Outcome Measure   Help from another person eating meals?: A Little Help from another person taking care of personal grooming?: A Little Help from another person toileting, which includes using  toliet, bedpan, or urinal?: A Lot Help from another person bathing (including washing, rinsing, drying)?: A Lot Help from another person to put on and taking off regular upper body clothing?: A Lot Help from another person to put on and taking off regular lower body clothing?: Total 6 Click Score: 13    End of Session Equipment Utilized During Treatment: Gait belt;Rolling walker (2 wheels)  OT Visit Diagnosis: Unsteadiness on feet (R26.81);Other abnormalities of gait and mobility (R26.89);Muscle weakness (generalized) (M62.81)   Activity Tolerance Patient limited by fatigue;Patient limited by pain    Patient Left in bed;with bed alarm set;with call bell/phone within reach   Nurse Communication Mobility status        Time: 7591-6384 OT Time Calculation (min): 35 min  Charges: OT General Charges $OT Visit: 1 Visit OT Treatments $Therapeutic Activity: 8-22 mins  [place], OTR/L Acute Rehab Pager: [phone] Office: [place] 11/26/2021, 8:50 AM

## 2021-11-27 DIAGNOSIS — I469 Cardiac arrest, cause unspecified: Secondary | ICD-10-CM | POA: Diagnosis not present

## 2021-11-27 DIAGNOSIS — N179 Acute kidney failure, unspecified: Secondary | ICD-10-CM | POA: Diagnosis not present

## 2021-11-27 DIAGNOSIS — I2602 Saddle embolus of pulmonary artery with acute cor pulmonale: Secondary | ICD-10-CM | POA: Diagnosis not present

## 2021-11-27 DIAGNOSIS — J9601 Acute respiratory failure with hypoxia: Secondary | ICD-10-CM | POA: Diagnosis not present

## 2021-11-27 LAB — CBC
HCT: 23.9 % — ABNORMAL LOW (ref 36.0–46.0)
Hemoglobin: 7.9 g/dL — ABNORMAL LOW (ref 12.0–15.0)
MCH: 29.3 pg (ref 26.0–34.0)
MCHC: 33.1 g/dL (ref 30.0–36.0)
MCV: 88.5 fL (ref 80.0–100.0)
Platelets: 42 10*3/uL — ABNORMAL LOW (ref 150–400)
RBC: 2.7 MIL/uL — ABNORMAL LOW (ref 3.87–5.11)
RDW: 17.5 % — ABNORMAL HIGH (ref 11.5–15.5)
WBC: 8.7 10*3/uL (ref 4.0–10.5)
nRBC: 0 % (ref 0.0–0.2)

## 2021-11-27 LAB — RENAL FUNCTION PANEL
Albumin: 2.3 g/dL — ABNORMAL LOW (ref 3.5–5.0)
Anion gap: 10 (ref 5–15)
BUN: 28 mg/dL — ABNORMAL HIGH (ref 8–23)
CO2: 26 mmol/L (ref 22–32)
Calcium: 8.3 mg/dL — ABNORMAL LOW (ref 8.9–10.3)
Chloride: 94 mmol/L — ABNORMAL LOW (ref 98–111)
Creatinine, Ser: 3.25 mg/dL — ABNORMAL HIGH (ref 0.44–1.00)
GFR, Estimated: 14 mL/min — ABNORMAL LOW (ref >60)
Glucose, Bld: 140 mg/dL — ABNORMAL HIGH (ref 70–99)
Phosphorus: 3.5 mg/dL (ref 2.5–4.6)
Potassium: 4.2 mmol/L (ref 3.5–5.1)
Sodium: 130 mmol/L — ABNORMAL LOW (ref 135–145)

## 2021-11-27 LAB — GLUCOSE, CAPILLARY
Glucose-Capillary: 134 mg/dL — ABNORMAL HIGH (ref 70–99)
Glucose-Capillary: 154 mg/dL — ABNORMAL HIGH (ref 70–99)
Glucose-Capillary: 155 mg/dL — ABNORMAL HIGH (ref 70–99)
Glucose-Capillary: 199 mg/dL — ABNORMAL HIGH (ref 70–99)
Glucose-Capillary: 201 mg/dL — ABNORMAL HIGH (ref 70–99)
Glucose-Capillary: 209 mg/dL — ABNORMAL HIGH (ref 70–99)
Glucose-Capillary: 78 mg/dL (ref 70–99)

## 2021-11-27 LAB — MAGNESIUM: Magnesium: 2 mg/dL (ref 1.7–2.4)

## 2021-11-27 MED ORDER — SUCRALFATE 1 GM/10ML PO SUSP
1.0000 g | Freq: Three times a day (TID) | ORAL | Status: DC
  Administered 2021-11-27 – 2021-12-03 (×17): 1 g via ORAL
  Filled 2021-11-27 (×18): qty 10

## 2021-11-27 MED ORDER — OXYCODONE HCL 5 MG PO TABS
5.0000 mg | ORAL_TABLET | ORAL | Status: DC | PRN
  Administered 2021-11-27 – 2021-11-28 (×2): 5 mg via ORAL
  Filled 2021-11-27 (×3): qty 1

## 2021-11-27 NOTE — Progress Notes (Signed)
Requested to see pt for out-pt HD arrangements. Pt's chart reviewed. It appears that pt's d/c plan is still undetermined. If CIR is not an option for any reason and pt needs to admit to a snf, navigator will need to clip pt to clinic that snf transports to. Will continue to follow and assist with out-pt HD arrangements once d/c disposition is known.   Melven Sartorius Renal Navigator [phone]

## 2021-11-27 NOTE — Progress Notes (Signed)
[facility] Progress Note     Assessment/ Plan:   Renal failure secondary to ATN in the setting of cardiac arrest, hypotension, pressors, contrast.  BL CKD3A with BL creatinine in the 1.1-1.3 range in late 2021.     MWF schedule right now.  Has been RRT dependent since 1/4 with min UOP so prospect of renal recovery is becoming less likely -this was discussed with the patient and the family on 1/29.  Because they noted she made some urine output or permanent plans for outpatient dialysis and permanent access had been delayed. She has urinated slightly more over the last 24-48 hours. Crt still rising between dialysis.   I think we can start CLIP but holding off on AVF/G creation for now. TDC in place    VF cardiac arrest--> OOH VF arrest 2/2 massive PE  Massive PE s/p mechanical thrombectomy 1/2 of R and L PA.  Vasc US- no DVT, liver mass noted 10/28/21 CT abd/ pelvis, s/p MRI abd--> incompletely characterized, had MRI with gadolinium 1/20  to reevaluate -- not definitive.    Oncology following  Retroperitoneal bleed/Hemorrhagic shock/Acute blood loss anemia originating from left liver lobe s/p embolization 1/4--> required FFP, plts, Vit K, pRBCs; now stable. Hb in 8s----- 7's now.  Hematology following - they say no ESA. Transfuse when needed   Thrombocytopenia- component of DIC suspected, HIT negative, on bivalirudin and avoiding heparin per heme, getting platelet transfusions PRN, heme involved, suspects ITP, getting Nplate PRN and dex. -  work up and mgmt per heme  Acute hypoxic RF- extubated 11/07/21, now weaned to RA-  using midodrine to assist with BP support to achieve UF   Sepsis/ leukocytosis/ aspiration pneumonia- MRI with aspiration PNA.  S/p  CTX and vanc   FEN: off TPN now, on Ensure, prosource and vital 1.5-  have changed the ensure to nepro due to high K content  Bones- phos was high now improved to 3.5-   PTH 129   Subjective:    Patient is making some  urine.  Urinated into bedpan while I was in the room.  Tolerated dialysis yesterday with 2 L of ultrafiltration.   Objective:   BP 135/69 (BP Location: Left Arm)    Pulse 91    Temp 98.2 F (36.8 C) (Oral)    Resp 17    Ht _0  (1.651 m)    Wt 76.8 kg    SpO2 96%    BMI 28.18 kg/m   Intake/Output Summary (Last 24 hours) at 11/27/2021 9562 Last data filed at 11/27/2021 0800 Gross per 24 hour  Intake 120 ml  Output 2400 ml  Net -2280 ml   Weight change:   Physical Exam: GEN: NAD,lying in bed HEENT:  sclerae anicteric, no nasal discharge NECK: Supple, no thyromegaly LUNGS: Normal work of breathing, coarse breath sounds bilaterally CV: Reg rate, no audible murmur ABD: soft, nontender EXT: Warm and well perfused, trace edema  Imaging: CT BONE MARROW BIOPSY & ASPIRATION  Result Date: 11/26/2021 INDICATION: 71 year old female with history of thrombocytopenia. EXAM: CT-GUIDED BONE MARROW BIOPSY AND ASPIRATION MEDICATIONS: None ANESTHESIA/SEDATION: Fentanyl 25 mcg IV; Versed 1 mg IV Sedation Time: 10 minutes; The patient was continuously monitored during the procedure by the interventional radiology nurse under my direct supervision. COMPLICATIONS: None immediate. PROCEDURE: Informed consent was obtained from the patient following an explanation of the procedure, risks, benefits and alternatives. The patient understands, agrees and consents for the procedure. All questions were addressed. A time  out was performed prior to the initiation of the procedure. The patient was positioned prone and non-contrast localization CT was performed of the pelvis to demonstrate the iliac marrow spaces. The operative site was prepped and draped in the usual sterile fashion. Under sterile conditions and local anesthesia, a 22 gauge spinal needle was utilized for procedural planning. Next, an 11 gauge coaxial bone biopsy needle was advanced into the left iliac marrow space. Needle position was confirmed with CT  imaging. Initially, a bone marrow aspiration was performed. Next, a bone marrow biopsy was obtained with the 11 gauge outer bone marrow device. Samples were prepared with the cytotechnologist and deemed adequate. The needle was removed and superficial hemostasis was obtained with manual compression. A dressing was applied. The patient tolerated the procedure well without immediate post procedural complication. IMPRESSION: Successful CT guided left iliac bone marrow aspiration and core biopsy. Ruthann Cancer, MD [facility] Radiology Electronically Signed   By: Ruthann Cancer M.D.   On: 11/26/2021 10:28    Labs: BMET Recent Labs  Lab 11/21/21 0121 11/22/21 0232 11/23/21 0518 11/24/21 0309 11/25/21 0405 11/26/21 0615 11/27/21 0243  NA 132* 136 129* 133* 129* 129*   130* 130*  K 4.8 4.7 5.5* 4.6 5.1 5.5*   5.5* 4.2  CL 96* 95* 94* 96* 95* 94*   94* 94*  CO2 20* 24 21* _0 GLUCOSE 242* 110* 259* 130* 154* 100*   99 140*  BUN 92* 60* 86* 43* 66* 86*   85* 28*  CREATININE 5.24* 3.92* 5.38* 3.69* 5.05* 6.50*   6.44* 3.25*  CALCIUM 8.4* 8.9 8.5* 8.4* 8.4* 8.8*   8.8* 8.3*  PHOS 8.7* 5.4* 6.4* 4.3 4.5 4.4 3.5   CBC Recent Labs  Lab 11/23/21 0518 11/24/21 0309 11/25/21 0405 11/26/21 0615 11/27/21 0243  WBC 11.1* 11.2* 11.3* 12.1* 8.7  NEUTROABS 8.9*  --   --   --   --   HGB 8.4* 7.4* 7.6* 7.6* 7.9*  HCT 24.4* 22.8* 23.7* 23.5* 23.9*  MCV 87.8 89.1 90.1 87.7 88.5  PLT 25* 26* 42* 58* 42*    Medications:     apixaban  5 mg Oral BID   atorvastatin  20 mg Oral Daily   B-complex with vitamin C  1 tablet Oral Daily   chlorhexidine  15 mL Mouth Rinse BID   Chlorhexidine Gluconate Cloth  6 each Topical Daily   Chlorhexidine Gluconate Cloth  6 each Topical Q0600   dicyclomine  10 mg Oral TID AC   DULoxetine  20 mg Oral Daily   feeding supplement (NEPRO CARB STEADY)  237 mL Oral TID BM   feeding supplement (VITAL 1.5 CAL)   1,000 mL Per Tube I62V   folic acid  2 mg Oral Daily   insulin aspart  0-9 Units Subcutaneous Q4H   insulin aspart  4 Units Subcutaneous Q4H   insulin detemir  8 Units Subcutaneous BID   mouth rinse  15 mL Mouth Rinse q12n4p   midodrine  10 mg Oral TID WC   pantoprazole  40 mg Oral BID   sucralfate  1 g Per Tube TID    [facility]

## 2021-11-27 NOTE — Progress Notes (Signed)
Ms. Tirone says she had a good day yesterday.  She had the bone marrow test done.  I appreciate Interventional Radiology doing this for [name].  I would not expect any results out for another day or 2.  She said that she is making a little bit of urine.  Her BUN and creatinine certainly her whole lot better with dialysis.  The BUN is 28 creatinine 3.25.  Her CBC shows white cell count of 8.7.  Hemoglobin 7.9.  Platelet count 42,000.  At least her platelets are holding steady.  We still have to worry about what is in the liver.  We still have to worry about the etiology of the thromboembolic disease.  She is on Eliquis.  At some point, I think once the platelet count can get above 50,000, then we might build to think about a liver biopsy.  She still has the Panda tube in for feeding.  She says that she is able to swallow and eat.  I am unsure how much she is able to eat.  Hopefully, the pain a feeding tube will be able to come out and she can be maintained on oral nutrition.  She has had no obvious bleeding.  It does look like she is making progress.  I know this is been a very long hospital recovery.  I would think that she might be able to go to the CIR when she is stable.  I know everybody is working hard with her.  I know she is getting great care from all staff appear on 4 E.  Her vital signs show temperature 98.1.  Pulse 90.  Blood pressure 138/73.  Her lungs do sound clear.  Cardiac exam regular rate and rhythm.  She has no murmurs rubs or bruits.  Abdomen is soft.  Bowel sounds might be slightly decreased.  There is no distention.  There is no obvious fluid wave.  Extremity shows no clubbing, cyanosis or edema.  Neurological exam shows no focal neurological deficits.  [name], MD  [place] 19:26

## 2021-11-27 NOTE — Progress Notes (Signed)
This chaplain attempted F/U spiritual care.  The Pt. daughter-[name] is at the bedside and the Pt. is asleep. The chaplain agrees to [facility] suggestion for afternoon visits, because the Pt. often catches up on her sleep in the morning.  Chaplain Sallyanne Kuster [phone]

## 2021-11-27 NOTE — Progress Notes (Signed)
After bladder scanning patient- RN found 391 cc of urine in bladder. Per MD we performed straight cath via sterile procedure and got 600cc of urine out. Patient indicated that she felt a relief of pressure.  [place] N Samentha Perham

## 2021-11-27 NOTE — Progress Notes (Signed)
Speech Language Pathology Treatment: Dysphagia  Patient Details Name: [name] MRN: [id number] DOB: 11-03-1950 Today's Date: 11/27/2021 Time: 6728-9791 SLP Time Calculation (min) (ACUTE ONLY): 10 min  Assessment / Plan / Recommendation Clinical Impression  Pt's mentation is much improved.  Her dysphagia has essentially resolved: mastication is functional - her missing teeth are the primary obstacle for her; swallow response appears to be brisk and without delay; there are no s/s of aspiration when taxed with successive sips of thin liquid.  Pt agrees it would be beneficial to advance diet to allow her a broader range of food choices. Will advance to dysphagia 3, thin liquids.  No further SLP f/u is needed - swallow function is normal. Our service will sign off. D/W RN via Secure Chat.    HPI HPI: Pt is a 71 yo female presenting 1/1 after prolonged OOH VF arrest (20 min of CPR, also coded 2x in ED, 4 min and 3 min) secondary to massive PE complicated by DIC. She is s/p thrombectomy on 1/2. She was orally intubated for ten days. Hospital course further complicated by AKI requiring CRRT, hemorrhagic shock due to hepatic bleeding, vomiting wtih TF. PMH includes DM, HTN, HLD, GERD, anxiety, depression, fibromyalgia      SLP Plan  All goals met      Recommendations for follow up therapy are one component of a multi-disciplinary discharge planning process, led by the attending physician.  Recommendations may be updated based on patient status, additional functional criteria and insurance authorization.    Recommendations  Diet recommendations: Dysphagia 3 (mechanical soft) Liquids provided via: Cup;Straw Medication Administration: Whole meds with puree Supervision: Staff to assist with self feeding Postural Changes and/or Swallow Maneuvers: Seated upright 90 degrees                Oral Care Recommendations: Oral care BID Follow Up Recommendations: No SLP follow up SLP Visit  Diagnosis: Dysphagia, oropharyngeal phase (R13.12) Plan: All goals met         Taysha Majewski L. Tivis Ringer, [place] CCC/SLP Acute Rehabilitation Services Office number [phone] Pager [phone]   Juan Quam Laurice  11/27/2021, 10:02 AM
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
## 2021-11-28 DIAGNOSIS — N179 Acute kidney failure, unspecified: Secondary | ICD-10-CM | POA: Diagnosis not present

## 2021-11-28 DIAGNOSIS — I2602 Saddle embolus of pulmonary artery with acute cor pulmonale: Secondary | ICD-10-CM | POA: Diagnosis not present

## 2021-11-28 DIAGNOSIS — I469 Cardiac arrest, cause unspecified: Secondary | ICD-10-CM | POA: Diagnosis not present

## 2021-11-28 DIAGNOSIS — J690 Pneumonitis due to inhalation of food and vomit: Secondary | ICD-10-CM | POA: Diagnosis not present

## 2021-11-28 LAB — CBC
HCT: 24.1 % — ABNORMAL LOW (ref 36.0–46.0)
Hemoglobin: 7.8 g/dL — ABNORMAL LOW (ref 12.0–15.0)
MCH: 28.9 pg (ref 26.0–34.0)
MCHC: 32.4 g/dL (ref 30.0–36.0)
MCV: 89.3 fL (ref 80.0–100.0)
Platelets: 62 10*3/uL — ABNORMAL LOW (ref 150–400)
RBC: 2.7 MIL/uL — ABNORMAL LOW (ref 3.87–5.11)
RDW: 16.9 % — ABNORMAL HIGH (ref 11.5–15.5)
WBC: 8.7 10*3/uL (ref 4.0–10.5)
nRBC: 0 % (ref 0.0–0.2)

## 2021-11-28 LAB — COMPREHENSIVE METABOLIC PANEL
ALT: 29 U/L (ref 0–44)
AST: 49 U/L — ABNORMAL HIGH (ref 15–41)
Albumin: 2.4 g/dL — ABNORMAL LOW (ref 3.5–5.0)
Alkaline Phosphatase: 148 U/L — ABNORMAL HIGH (ref 38–126)
Anion gap: 11 (ref 5–15)
BUN: 45 mg/dL — ABNORMAL HIGH (ref 8–23)
CO2: 24 mmol/L (ref 22–32)
Calcium: 8.5 mg/dL — ABNORMAL LOW (ref 8.9–10.3)
Chloride: 94 mmol/L — ABNORMAL LOW (ref 98–111)
Creatinine, Ser: 4.66 mg/dL — ABNORMAL HIGH (ref 0.44–1.00)
GFR, Estimated: 9 mL/min — ABNORMAL LOW (ref >60)
Glucose, Bld: 170 mg/dL — ABNORMAL HIGH (ref 70–99)
Potassium: 4.7 mmol/L (ref 3.5–5.1)
Sodium: 129 mmol/L — ABNORMAL LOW (ref 135–145)
Total Bilirubin: 0.8 mg/dL (ref 0.3–1.2)
Total Protein: 6 g/dL — ABNORMAL LOW (ref 6.5–8.1)

## 2021-11-28 LAB — URINALYSIS, ROUTINE W REFLEX MICROSCOPIC

## 2021-11-28 LAB — MAGNESIUM: Magnesium: 2 mg/dL (ref 1.7–2.4)

## 2021-11-28 LAB — GLUCOSE, CAPILLARY
Glucose-Capillary: 104 mg/dL — ABNORMAL HIGH (ref 70–99)
Glucose-Capillary: 131 mg/dL — ABNORMAL HIGH (ref 70–99)
Glucose-Capillary: 144 mg/dL — ABNORMAL HIGH (ref 70–99)
Glucose-Capillary: 149 mg/dL — ABNORMAL HIGH (ref 70–99)
Glucose-Capillary: 162 mg/dL — ABNORMAL HIGH (ref 70–99)
Glucose-Capillary: 195 mg/dL — ABNORMAL HIGH (ref 70–99)
Glucose-Capillary: 199 mg/dL — ABNORMAL HIGH (ref 70–99)
Glucose-Capillary: 224 mg/dL — ABNORMAL HIGH (ref 70–99)

## 2021-11-28 LAB — URINALYSIS, MICROSCOPIC (REFLEX): WBC, UA: >50 WBC/hpf (ref 0–5)

## 2021-11-28 LAB — LACTATE DEHYDROGENASE: LDH: 477 U/L — ABNORMAL HIGH (ref 98–192)

## 2021-11-28 LAB — PHOSPHORUS: Phosphorus: 4 mg/dL (ref 2.5–4.6)

## 2021-11-28 MED ORDER — HEPARIN SODIUM (PORCINE) 1000 UNIT/ML IJ SOLN
INTRAMUSCULAR | Status: AC
  Administered 2021-11-28: 1000 [IU]
  Filled 2021-11-28: qty 3

## 2021-11-28 NOTE — Progress Notes (Addendum)
PT Cancellation Note  Patient Details Name: [name] MRN: [id number] DOB: [birth year]   Cancelled Treatment:    Reason Eval/Treat Not Completed: Patient at procedure or test/unavailable (HD). Attempted to work with this pt for a 2nd time today. Pt refusing due to fatigue post dialysis and multiple bowel movements, having just been cleaned up, despite max encouragement. Will continue efforts.  Wyona Almas, PT, DPT Acute Rehabilitation Services Pager [phone] Office [phone]    Deno Etienne 11/28/2021, 8:13 AM
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
## 2021-11-28 NOTE — Progress Notes (Signed)
[facility] Progress Note     Assessment/ Plan:   Renal failure secondary to ATN in the setting of cardiac arrest, hypotension, pressors, contrast.  BL CKD3A with BL creatinine in the 1.1-1.3 range in late 2021.     MWF schedule right now.  Has been RRT dependent since 1/4 with minimal urine output for some time - this was discussed with the patient and the family on 1/29.  Because they noted she made some urine output or permanent plans for outpatient dialysis and permanent access had been delayed.  She seems to be urinating more more over the past 24 to 48 hours.  Creatinine continues to rise in between dialysis.  We have started CLIP for AKI. Could consider calling ESRD 1 month since starting CRRT (on 2/4) but will keep a close eye on intradialytic creatinine before doing this. Particularly over the weekend. If she remains dependent may need an AVF/G.    VF cardiac arrest--> OOH VF arrest 2/2 massive PE  Massive PE s/p mechanical thrombectomy 1/2 of R and L PA.  Vasc US- no DVT, liver mass noted 10/28/21 CT abd/ pelvis, s/p MRI abd--> incompletely characterized, had MRI with gadolinium 1/20  to reevaluate -- not definitive.    Oncology following  Retroperitoneal bleed/Hemorrhagic shock/Acute blood loss anemia originating from left liver lobe s/p embolization 1/4--> required FFP, plts, Vit K, pRBCs; now stable. Hb in 8s----- 7's now.  Hematology following - they say no ESA. Transfuse when needed   Thrombocytopenia- component of DIC suspected, HIT negative, on bivalirudin and avoiding heparin per heme, getting platelet transfusions PRN, heme involved, suspects ITP, getting Nplate PRN and dex. -  work up and mgmt per heme  Acute hypoxic RF- extubated 11/07/21, now weaned to RA-  using midodrine to assist with BP support to achieve UF   Sepsis/ leukocytosis/ aspiration pneumonia- MRI with aspiration PNA.  S/p  CTX and vanc   Bones- phos was high now improved to 3.5-   PTH  129  10. Liver lesions - possible biopsy once platelets have improved.   Subjective:    Patient feeling well today.  Has noticed significant increase in urine output.  700 documented yesterday.   Objective:   BP (!) 166/84    Pulse 86    Temp (!) 97.1 F (36.2 C) (Temporal)    Resp 16    Ht 5' 5"  (1.651 m)    Wt 73 kg    SpO2 96%    BMI 26.78 kg/m   Intake/Output Summary (Last 24 hours) at 11/28/2021 1014 Last data filed at 11/27/2021 2110 Gross per 24 hour  Intake 360 ml  Output 600 ml  Net -240 ml   Weight change: -4.5 kg  Physical Exam: GEN: NAD,lying in bed HEENT:  sclerae anicteric, no nasal discharge NECK: Supple, no thyromegaly LUNGS: Normal work of breathing, coarse breath sounds bilaterally CV: Reg rate, no audible murmur ABD: soft, nontender EXT: Warm and well perfused, trace edema  Imaging: CT BONE MARROW BIOPSY & ASPIRATION  Result Date: 11/26/2021 INDICATION: 71 year old female with history of thrombocytopenia. EXAM: CT-GUIDED BONE MARROW BIOPSY AND ASPIRATION MEDICATIONS: None ANESTHESIA/SEDATION: Fentanyl 25 mcg IV; Versed 1 mg IV Sedation Time: 10 minutes; The patient was continuously monitored during the procedure by the interventional radiology nurse under my direct supervision. COMPLICATIONS: None immediate. PROCEDURE: Informed consent was obtained from the patient following an explanation of the procedure, risks, benefits and alternatives. The patient understands, agrees and consents for the procedure.  All questions were addressed. A time out was performed prior to the initiation of the procedure. The patient was positioned prone and non-contrast localization CT was performed of the pelvis to demonstrate the iliac marrow spaces. The operative site was prepped and draped in the usual sterile fashion. Under sterile conditions and local anesthesia, a 22 gauge spinal needle was utilized for procedural planning. Next, an 11 gauge coaxial bone biopsy needle was advanced  into the left iliac marrow space. Needle position was confirmed with CT imaging. Initially, a bone marrow aspiration was performed. Next, a bone marrow biopsy was obtained with the 11 gauge outer bone marrow device. Samples were prepared with the cytotechnologist and deemed adequate. The needle was removed and superficial hemostasis was obtained with manual compression. A dressing was applied. The patient tolerated the procedure well without immediate post procedural complication. IMPRESSION: Successful CT guided left iliac bone marrow aspiration and core biopsy. Ruthann Cancer, MD [facility] Electronically Signed   By: Ruthann Cancer M.D.   On: 11/26/2021 10:28    Labs: BMET Recent Labs  Lab 11/22/21 0232 11/23/21 0518 11/24/21 0309 11/25/21 0405 11/26/21 0615 11/27/21 0243 11/28/21 0244 11/28/21 0246  NA 136 129* 133* 129* 129*   130* 130*  --  129*  K 4.7 5.5* 4.6 5.1 5.5*   5.5* 4.2  --  4.7  CL 95* 94* 96* 95* 94*   94* 94*  --  94*  CO2 24 21* 25 24 23   23 26   --  24  GLUCOSE 110* 259* 130* 154* 100*   99 140*  --  170*  BUN 60* 86* 43* 66* 86*   85* 28*  --  45*  CREATININE 3.92* 5.38* 3.69* 5.05* 6.50*   6.44* 3.25*  --  4.66*  CALCIUM 8.9 8.5* 8.4* 8.4* 8.8*   8.8* 8.3*  --  8.5*  PHOS 5.4* 6.4* 4.3 4.5 4.4 3.5 4.0  --    CBC Recent Labs  Lab 11/23/21 0518 11/24/21 0309 11/25/21 0405 11/26/21 0615 11/27/21 0243 11/28/21 0246  WBC 11.1*   < > 11.3* 12.1* 8.7 8.7  NEUTROABS 8.9*  --   --   --   --   --   HGB 8.4*   < > 7.6* 7.6* 7.9* 7.8*  HCT 24.4*   < > 23.7* 23.5* 23.9* 24.1*  MCV 87.8   < > 90.1 87.7 88.5 89.3  PLT 25*   < > 42* 58* 42* 62*   < > = values in this interval not displayed.    Medications:     apixaban  5 mg Oral BID   atorvastatin  20 mg Oral Daily   B-complex with vitamin C  1 tablet Oral Daily   chlorhexidine  15 mL Mouth Rinse BID   Chlorhexidine Gluconate Cloth  6 each Topical Daily    Chlorhexidine Gluconate Cloth  6 each Topical Q0600   dicyclomine  10 mg Oral TID AC   DULoxetine  20 mg Oral Daily   feeding supplement (NEPRO CARB STEADY)  237 mL Oral TID BM   feeding supplement (VITAL 1.5 CAL)  1,000 mL Per Tube L89Q   folic acid  2 mg Oral Daily   heparin sodium (porcine)       insulin aspart  0-9 Units Subcutaneous Q4H   insulin aspart  4 Units Subcutaneous Q4H   insulin detemir  8 Units Subcutaneous BID   mouth rinse  15 mL  Mouth Rinse q12n4p   midodrine  10 mg Oral TID WC   pantoprazole  40 mg Oral BID   sucralfate  1 g Oral TID    [facility]
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
## 2021-11-29 ENCOUNTER — Inpatient Hospital Stay (HOSPITAL_COMMUNITY): Payer: Medicare PPO

## 2021-11-29 DIAGNOSIS — I469 Cardiac arrest, cause unspecified: Secondary | ICD-10-CM | POA: Diagnosis not present

## 2021-11-29 LAB — CBC
HCT: 24.2 % — ABNORMAL LOW (ref 36.0–46.0)
Hemoglobin: 7.8 g/dL — ABNORMAL LOW (ref 12.0–15.0)
MCH: 28.9 pg (ref 26.0–34.0)
MCHC: 32.2 g/dL (ref 30.0–36.0)
MCV: 89.6 fL (ref 80.0–100.0)
Platelets: 63 10*3/uL — ABNORMAL LOW (ref 150–400)
RBC: 2.7 MIL/uL — ABNORMAL LOW (ref 3.87–5.11)
RDW: 16.9 % — ABNORMAL HIGH (ref 11.5–15.5)
WBC: 7.8 10*3/uL (ref 4.0–10.5)
nRBC: 0 % (ref 0.0–0.2)

## 2021-11-29 LAB — GLUCOSE, CAPILLARY
Glucose-Capillary: 175 mg/dL — ABNORMAL HIGH (ref 70–99)
Glucose-Capillary: 179 mg/dL — ABNORMAL HIGH (ref 70–99)
Glucose-Capillary: 85 mg/dL (ref 70–99)
Glucose-Capillary: 89 mg/dL (ref 70–99)
Glucose-Capillary: 211 mg/dL — ABNORMAL HIGH (ref 70–99)

## 2021-11-29 LAB — RENAL FUNCTION PANEL
Albumin: 2.5 g/dL — ABNORMAL LOW (ref 3.5–5.0)
Anion gap: 9 (ref 5–15)
BUN: 26 mg/dL — ABNORMAL HIGH (ref 8–23)
CO2: 27 mmol/L (ref 22–32)
Calcium: 8.4 mg/dL — ABNORMAL LOW (ref 8.9–10.3)
Chloride: 99 mmol/L (ref 98–111)
Creatinine, Ser: 2.95 mg/dL — ABNORMAL HIGH (ref 0.44–1.00)
GFR, Estimated: 16 mL/min — ABNORMAL LOW (ref >60)
Glucose, Bld: 163 mg/dL — ABNORMAL HIGH (ref 70–99)
Phosphorus: 2.7 mg/dL (ref 2.5–4.6)
Potassium: 4.2 mmol/L (ref 3.5–5.1)
Sodium: 135 mmol/L (ref 135–145)

## 2021-11-29 LAB — MAGNESIUM: Magnesium: 1.8 mg/dL (ref 1.7–2.4)

## 2021-11-29 MED ORDER — ALPRAZOLAM 0.25 MG PO TABS
0.2500 mg | ORAL_TABLET | Freq: Every day | ORAL | Status: DC | PRN
  Administered 2021-11-30 – 2021-12-04 (×2): 0.25 mg via ORAL
  Filled 2021-11-29 (×2): qty 1

## 2021-11-29 NOTE — Progress Notes (Signed)
Mobility Specialist: Progress Note   11/29/21 1623  Mobility  Activity Transferred from chair to bed  Level of Assistance +2 (takes two people)  Assistive Device  (HHA)  Distance Ambulated (ft) 2 ft  Activity Response Tolerated fair  $Mobility charge 1 Mobility   Pt assisted back to bed per request. +2 HHA to pivot from recliner to the bed. Pt stood x1 at the bed to be assisted with pericare with help from NT. Pt back to bed with RN and NT present in the room.   [facility] [place] Mobility Specialist Mobility Specialist 4 [place]: [phone] Mobility Specialist 2 [place]: [phone]
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
## 2021-11-29 NOTE — Progress Notes (Signed)
[facility] Progress Note     Assessment/ Plan:   Renal failure secondary to ATN in the setting of cardiac arrest, hypotension, pressors, contrast.  BL CKD3A with BL creatinine in the 1.1-1.3 range in late 2021.  Has been RRT dependent since 1/4 - this was discussed with the patient and the family on 1/29.  Because they noted she made some urine output or permanent plans for outpatient dialysis and permanent access had been delayed.  She tolerated dialysis yesterday.  Urine output has been higher over the past 2 to 3 days.  We will hold off on dialysis tomorrow to assess intradialytic creatinine.  We have started CLIP for AKI. Could consider calling ESRD 1 month since starting CRRT (on 2/4) but will keep a close eye on intradialytic creatinine before doing this. Particularly over the weekend. If she remains dependent may need an AVF/G.    VF cardiac arrest--> OOH VF arrest 2/2 massive PE  Massive PE s/p mechanical thrombectomy 1/2 of R and L PA.  Vasc US- no DVT, liver mass noted 10/28/21 CT abd/ pelvis, s/p MRI abd--> incompletely characterized, had MRI with gadolinium 1/20  to reevaluate -- not definitive.    Oncology following  Retroperitoneal bleed/Hemorrhagic shock/Acute blood loss anemia originating from left liver lobe s/p embolization 1/4--> required FFP, plts, Vit K, pRBCs; now stable. Hb in 8s----- 7's now.  Hematology following - they say no ESA. Transfuse when needed   Thrombocytopenia- component of DIC suspected, HIT negative, on bivalirudin and avoiding heparin per heme, getting platelet transfusions PRN, heme involved, suspects ITP, getting Nplate PRN and dex. -  work up and mgmt per heme  Acute hypoxic RF- extubated 11/07/21, now weaned to RA-  using midodrine to assist with BP support to achieve UF   Sepsis/ leukocytosis/ aspiration pneumonia- MRI with aspiration PNA.  S/p  CTX and vanc   Bones- phos was high now improved-   PTH 129  10. Liver lesions -  possible biopsy once platelets have improved.   Subjective:    Patient feels well today without any complaints.  Urine output even higher than previous at 950 cc   Objective:   BP (!) 156/74 (BP Location: Right Arm)    Pulse 92    Temp 98.5 F (36.9 C) (Oral)    Resp 17    Ht 5\' 5"  (1.651 m)    Wt 74 kg    SpO2 97%    BMI 27.15 kg/m   Intake/Output Summary (Last 24 hours) at 11/29/2021 0914 Last data filed at 11/29/2021 8889 Gross per 24 hour  Intake 1080 ml  Output 1050 ml  Net 30 ml   Weight change: 0.7 kg  Physical Exam: GEN: NAD,lying in bed HEENT:  sclerae anicteric, no nasal discharge NECK: Supple, no thyromegaly LUNGS: Normal work of breathing, coarse breath sounds bilaterally CV: Reg rate, no audible murmur ABD: soft, nontender EXT: Warm and well perfused, trace edema  Imaging: No results found.  Labs: BMET Recent Labs  Lab 11/23/21 0518 11/24/21 0309 11/25/21 0405 11/26/21 0615 11/27/21 0243 11/28/21 0244 11/28/21 0246 11/29/21 0548  NA 129* 133* 129* 129*   130* 130*  --  129* 135  K 5.5* 4.6 5.1 5.5*   5.5* 4.2  --  4.7 4.2  CL 94* 96* 95* 94*   94* 94*  --  94* 99  CO2 21* 25 24 23   23 26   --  24 27  GLUCOSE 259* 130* 154* 100*  99 140*  --  170* 163*  BUN 86* 43* 66* 86*   85* 28*  --  45* 26*  CREATININE 5.38* 3.69* 5.05* 6.50*   6.44* 3.25*  --  4.66* 2.95*  CALCIUM 8.5* 8.4* 8.4* 8.8*   8.8* 8.3*  --  8.5* 8.4*  PHOS 6.4* 4.3 4.5 4.4 3.5 4.0  --  2.7   CBC Recent Labs  Lab 11/23/21 0518 11/24/21 0309 11/26/21 0615 11/27/21 0243 11/28/21 0246 11/29/21 0548  WBC 11.1*   < > 12.1* 8.7 8.7 7.8  NEUTROABS 8.9*  --   --   --   --   --   HGB 8.4*   < > 7.6* 7.9* 7.8* 7.8*  HCT 24.4*   < > 23.5* 23.9* 24.1* 24.2*  MCV 87.8   < > 87.7 88.5 89.3 89.6  PLT 25*   < > 58* 42* 62* 63*   < > = values in this interval not displayed.    Medications:     apixaban  5 mg Oral BID   atorvastatin  20 mg Oral Daily   B-complex with vitamin C  1  tablet Oral Daily   chlorhexidine  15 mL Mouth Rinse BID   Chlorhexidine Gluconate Cloth  6 each Topical Daily   Chlorhexidine Gluconate Cloth  6 each Topical Q0600   dicyclomine  10 mg Oral TID AC   DULoxetine  20 mg Oral Daily   feeding supplement (NEPRO CARB STEADY)  237 mL Oral TID BM   feeding supplement (VITAL 1.5 CAL)  1,000 mL Per Tube F02D   folic acid  2 mg Oral Daily   insulin aspart  0-9 Units Subcutaneous Q4H   insulin aspart  4 Units Subcutaneous Q4H   insulin detemir  8 Units Subcutaneous BID   mouth rinse  15 mL Mouth Rinse q12n4p   midodrine  10 mg Oral TID WC   pantoprazole  40 mg Oral BID   sucralfate  1 g Oral TID    Reesa Chew  [facility]

## 2021-11-29 NOTE — Progress Notes (Signed)
Ms. Freyre's platelet count is 63,000.  I still do not know why the bone marrow report is not back yet.  She had a bone marrow biopsy done about a week ago.  There is no bleeding.  She is still getting dialysis.  Her LDH is down to 477.  She says that she is going to go to rehab.  I am unsure when this will be.  Her LFTs are improved back to normal now.  Alkaline phosphatase is 148.  SGPT is 29 SGOT 49.  Her BUN and creatinine are 26 and 2.95.  She is on Eliquis.  There is no bleeding with the Eliquis.  When she came in, she did test positive for the lupus anticoagulant.  I think we will need to see what the level is in about 2 months or so.  I do still think that we can use Coumadin because of the renal issues.  Cultures are negative.  She still is on tube feeds.  Hopefully, she will be able to swallow and eat and get the feeding tube out.  Her vital signs are all stable.  Her blood pressure is 165/78.  Temperature is 98.4.  Her lungs sound clear.  Cardiac exam regular rate and rhythm.  Abdomen is soft.  Bowel sounds are slightly decreased.  There is no fluid wave.  There is no palpable liver or spleen tip.  Extremity shows no clubbing, cyanosis or edema.  Neurological exam is nonfocal.  We still have to think about what is in the liver.  Again, I think we have the flexibility to be able to hold off on doing any kind of biopsy until her platelet count does improve.  Hopefully, the bone marrow biopsy result will be today or tomorrow.   [name], MD  [place] 12:12

## 2021-11-29 NOTE — Progress Notes (Signed)
Occupational Therapy Treatment Patient Details Name: [name] MRN: [id number] DOB: [birth year] Today's Date: 11/29/2021   History of present illness 71 y.o. female admitted 10/28/21 after cardiac arrest requiring 20-min CPR for ROSC; ETT in the field. Pt had 2x additional codes in ED requiring CPR for 4 and 2-min. Workup for STEMI, cardiogenic shock, large saddle PE, RUQ bleeding, 2 rib fxs. S/p PE thrombectomy 1/2. Pt with intraperitoneal hemorrhage s/p embolization 1/5. ETT 1/1-1/10. S/p L femoral non-tunneled HD catheter placed 1/4. CRRT 1/4-1/11; trialled iHD 1/12; CRRT again 1/15-1/18. Began iHD again 1/20. S/p RIJ tunneled HD catheter placed 1/24. Pt with persistent thrombocytopenia; plan for bone marrow biopsy 1/27. PMH includes fibromyalgia, HTN, DM2, CKD, GERD, asthma.   OT comments  Pt continues to present with decreased strength and activity tolerance as well as anxiety limiting her engagement. Pt performing ROM exercises while sitting at EOB. Discussing need for OOB and performing stand pivot with Max A +2. Pt however, able to perform sit<>stand prior to pivot with Min A +2 demonstrating functional strength to continue progress.  During session, pt mentioning she takes Xanax everyday at home; daughter confirms this info. Chat with MD (around 1600) and he made (this AM) xanax available for patient to take as she does at home. Will attempt to premedicate in preparation for therapy session to reduce anxiety and promote more occupational performance and participation.  Continue to recommend dc to SNF and will continue to follow acutely and admitted.    Recommendations for follow up therapy are one component of a multi-disciplinary discharge planning process, led by the attending physician.  Recommendations may be updated based on patient status, additional functional criteria and insurance authorization.    Follow Up Recommendations  Skilled nursing-short term rehab (<3 hours/day)     Assistance Recommended at Discharge Frequent or constant Supervision/Assistance  Patient can return home with the following  Two people to help with walking and/or transfers;A lot of help with bathing/dressing/bathroom;Assistance with cooking/housework   Equipment Recommendations  Other (comment)    Recommendations for Other Services Rehab consult    Precautions / Restrictions Precautions Precautions: Fall;Other (comment) Precaution Comments: cortrak Restrictions Weight Bearing Restrictions: No       Mobility Bed Mobility Overal bed mobility: Needs Assistance Bed Mobility: Supine to Sit     Supine to sit: HOB elevated, Min assist, +2 for safety/equipment     General bed mobility comments: elevating HOB to support trunk and then pt bringing legs towards EOB with minA. Cues provided for use of bed rail with UEs and to ascend trunk. Min A to bring L hip more towards EOB.    Transfers Overall transfer level: Needs assistance Equipment used: 2 person hand held assist Transfers: Sit to/from Stand, Bed to chair/wheelchair/BSC Sit to Stand: Min assist, +2 physical assistance, +2 safety/equipment, From elevated surface Stand pivot transfers: Max assist, +2 safety/equipment, +2 physical assistance         General transfer comment: Min A + 2 for power up from edge of bed with bil HHA. MaxAx2 to pivot hips towards R bed > recliner. Pt deferred further transfers from recliner.     Balance Overall balance assessment: Needs assistance Sitting-balance support: No upper extremity supported, Feet supported Sitting balance-Leahy Scale: Fair Sitting balance - Comments: Sits EOB shifting weight anteriorly with elbows on knees and laterally at times, min gurd-supervision for safety.   Standing balance support: Bilateral upper extremity supported, Reliant on assistive device for balance Standing balance-Leahy Scale: Poor Standing  balance comment: Bil UE support and minA x2 for standing  statically                           ADL either performed or assessed with clinical judgement   ADL Overall ADL's : Needs assistance/impaired                         Toilet Transfer: Maximal assistance;+2 for physical assistance;Stand-pivot           Functional mobility during ADLs: Maximal assistance;+2 for physical assistance General ADL Comments: Focused session on getting patient OOB as she has been very limited with this in past session. Pt continues to present with self limiting behavior and anxiety. Max A +2 for stand pivot to recliner. then pt declining any further therapy    Extremity/Trunk Assessment Upper Extremity Assessment Upper Extremity Assessment: Generalized weakness   Lower Extremity Assessment Lower Extremity Assessment: Defer to PT evaluation        Vision       Perception     Praxis      Cognition Arousal/Alertness: Awake/alert Behavior During Therapy: Flat affect, Anxious Overall Cognitive Status: Impaired/Different from baseline Area of Impairment: Attention, Following commands, Safety/judgement, Awareness, Problem solving                   Current Attention Level: Selective   Following Commands: Follows one step commands with increased time Safety/Judgement: Decreased awareness of deficits Awareness: Emergent Problem Solving: Slow processing, Decreased initiation, Requires verbal cues, Difficulty sequencing, Requires tactile cues General Comments: Requires increased time to process simple cues and sequence tasks. Self-limited by anxiety and gets overwhelmed with too much info/explanation etc.        Exercises Exercises: General Lower Extremity General Exercises - Lower Extremity Long Arc Quad: AROM, Seated, Both, 10 reps Heel Slides: AROM, Both, 10 reps, Seated Other Exercises Other Exercises: PROM to bil ankles into dorsiflexion 3x ~20 sec each by PT start of session, then 3-5x ~5-10 sec each by pt with use  of gait belt end of session    Shoulder Instructions       General Comments Pt mentioning she takes Xanax everyday at home. Daughter confirms this. Discuss with RN and she is unsure if she has xanax on board. Per chat with MD (around 1600), he has made (this AM) xanax available for patient to take as she does at home.    Pertinent Vitals/ Pain       Pain Assessment Pain Assessment: Faces Faces Pain Scale: Hurts little more Pain Location: R ankle with stretch into dorsiflexion Pain Descriptors / Indicators: Tightness, Grimacing, Guarding Pain Intervention(s): Monitored during session, Limited activity within patient's tolerance, Repositioned  Home Living                                          Prior Functioning/Environment              Frequency  Min 2X/week        Progress Toward Goals  OT Goals(current goals can now be found in the care plan section)  Progress towards OT goals: Progressing toward goals  Acute Rehab OT Goals OT Goal Formulation: With patient/family Time For Goal Achievement: 12/10/21 Potential to Achieve Goals: Good ADL Goals Pt Will Perform Grooming: with set-up;with supervision;sitting Pt Will  Perform Upper Body Dressing: with set-up;with supervision;sitting Pt Will Transfer to Toilet: with min assist;stand pivot transfer;bedside commode Additional ADL Goal #1: Pt will tolerate sitting at EOB with Min Guard A in preparation for ADLs Additional ADL Goal #2: Pt will demonstrate increased activity tolerance to perform four grooming tasks in sitting with Supervision  Plan Discharge plan needs to be updated    Co-evaluation    PT/OT/SLP Co-Evaluation/Treatment: Yes Reason for Co-Treatment: For patient/therapist safety;To address functional/ADL transfers PT goals addressed during session: Mobility/safety with mobility;Balance;Strengthening/ROM OT goals addressed during session: ADL's and self-care      AM-PAC OT "6 Clicks"  Daily Activity     Outcome Measure   Help from another person eating meals?: A Little Help from another person taking care of personal grooming?: A Little Help from another person toileting, which includes using toliet, bedpan, or urinal?: A Lot Help from another person bathing (including washing, rinsing, drying)?: A Lot Help from another person to put on and taking off regular upper body clothing?: A Lot Help from another person to put on and taking off regular lower body clothing?: Total 6 Click Score: 13    End of Session Equipment Utilized During Treatment: Gait belt;Rolling walker (2 wheels)  OT Visit Diagnosis: Unsteadiness on feet (R26.81);Other abnormalities of gait and mobility (R26.89);Muscle weakness (generalized) (M62.81)   Activity Tolerance Patient limited by fatigue;Patient limited by pain   Patient Left in bed;with bed alarm set;with call bell/phone within reach   Nurse Communication Mobility status        Time: 1351-1426 OT Time Calculation (min): 35 min  Charges: OT General Charges $OT Visit: 1 Visit OT Treatments $Therapeutic Activity: 8-22 mins  Dilyn Osoria MSOT, OTR/L Acute Rehab Office: [place] 11/29/2021, 4:27 PM

## 2021-11-29 NOTE — Progress Notes (Signed)
Physical Therapy Treatment Patient Details Name: [name] MRN: [id number] DOB: 04-02-1951 Today's Date: 11/29/2021   History of Present Illness 71 y.o. female admitted 10/28/21 after cardiac arrest requiring 20-min CPR for ROSC; ETT in the field. Pt had 2x additional codes in ED requiring CPR for 4 and 2-min. Workup for STEMI, cardiogenic shock, large saddle PE, RUQ bleeding, 2 rib fxs. S/p PE thrombectomy 1/2. Pt with intraperitoneal hemorrhage s/p embolization 1/5. ETT 1/1-1/10. S/p L femoral non-tunneled HD catheter placed 1/4. CRRT 1/4-1/11; trialled iHD 1/12; CRRT again 1/15-1/18. Began iHD again 1/20. S/p RIJ tunneled HD catheter placed 1/24. Pt with persistent thrombocytopenia; plan for bone marrow biopsy 1/27. PMH includes fibromyalgia, HTN, DM2, CKD, GERD, asthma.    PT Comments    Pt was able to progress to transferring bed > recliner today. However, she required maxAx2 to pivot, primarily being limited by her anxiety with mobility. Pt is able to transfer to stand and stand statically with minAx2 though. Educated pt on providing self-stretches to her ankles into dorsiflexion to address her gastroc tightness. Coordinated with RN to see if pt could get anxiety meds (pt was taking them at home) for future therapy sessions, as this is a primary limiting factor. Will continue to follow acutely. Current recommendations remain appropriate.    Recommendations for follow up therapy are one component of a multi-disciplinary discharge planning process, led by the attending physician.  Recommendations may be updated based on patient status, additional functional criteria and insurance authorization.  Follow Up Recommendations  Skilled nursing-short term rehab (<3 hours/day)     Assistance Recommended at Discharge Frequent or constant Supervision/Assistance  Patient can return home with the following Two people to help with walking and/or transfers;Assistance with cooking/housework;Assist for  transportation;Help with stairs or ramp for entrance;A lot of help with bathing/dressing/bathroom   Equipment Recommendations  Rolling walker (2 wheels);Wheelchair (measurements PT);Wheelchair cushion (measurements PT);Hospital bed    Recommendations for Other Services       Precautions / Restrictions Precautions Precautions: Fall;Other (comment) Precaution Comments: cortrak Restrictions Weight Bearing Restrictions: No     Mobility  Bed Mobility Overal bed mobility: Needs Assistance Bed Mobility: Supine to Sit     Supine to sit: HOB elevated, Min assist, +2 for safety/equipment     General bed mobility comments: elevating HOB to support trunk and then pt bringing legs towards EOB with minA. Cues provided for use of bed rail with UEs and to ascend trunk. Min A to bring L hip more towards EOB.    Transfers Overall transfer level: Needs assistance Equipment used: 2 person hand held assist Transfers: Sit to/from Stand, Bed to chair/wheelchair/BSC Sit to Stand: Min assist, +2 physical assistance, +2 safety/equipment, From elevated surface Stand pivot transfers: Max assist, +2 safety/equipment, +2 physical assistance         General transfer comment: Min A + 2 for power up from edge of bed with bil HHA. MaxAx2 to pivot hips towards R bed > recliner. Pt deferred further transfers from recliner.    Ambulation/Gait               General Gait Details: Unable   Stairs             Wheelchair Mobility    Modified Rankin (Stroke Patients Only)       Balance Overall balance assessment: Needs assistance Sitting-balance support: No upper extremity supported, Feet supported Sitting balance-Leahy Scale: Fair Sitting balance - Comments: Sits EOB shifting weight anteriorly with elbows  on knees and laterally at times, min gurd-supervision for safety.   Standing balance support: Bilateral upper extremity supported, Reliant on assistive device for balance Standing  balance-Leahy Scale: Poor Standing balance comment: Bil UE support and minA x2 for standing statically                            Cognition Arousal/Alertness: Awake/alert Behavior During Therapy: Flat affect, Anxious Overall Cognitive Status: Impaired/Different from baseline Area of Impairment: Attention, Following commands, Safety/judgement, Awareness, Problem solving                   Current Attention Level: Selective   Following Commands: Follows one step commands with increased time Safety/Judgement: Decreased awareness of deficits Awareness: Emergent Problem Solving: Slow processing, Decreased initiation, Requires verbal cues, Difficulty sequencing, Requires tactile cues General Comments: Requires increased time to process simple cues and sequence tasks. Self-limited by anxiety and gets overwhelmed with too much info/explanation etc.        Exercises General Exercises - Lower Extremity Long Arc Quad: AROM, Seated, Both, 10 reps Heel Slides: AROM, Both, 10 reps, Seated Other Exercises Other Exercises: PROM to bil ankles into dorsiflexion 3x ~20 sec each by PT start of session, then 3-5x ~5-10 sec each by pt with use of gait belt end of session    General Comments General comments (skin integrity, edema, etc.): pt reports having anxiety meds at home, conveyed info to RN to see if pt can get any for future therapy sessions      Pertinent Vitals/Pain Pain Assessment Pain Assessment: Faces Faces Pain Scale: Hurts little more Pain Location: R ankle with stretch into dorsiflexion Pain Descriptors / Indicators: Tightness, Grimacing, Guarding Pain Intervention(s): Limited activity within patient's tolerance, Monitored during session, Repositioned, Other (comment) (educated pt on self-stretches)    Home Living                          Prior Function            PT Goals (current goals can now be found in the care plan section) Acute Rehab PT  Goals Patient Stated Goal: to improve PT Goal Formulation: With patient/family Time For Goal Achievement: 12/06/21 Potential to Achieve Goals: Fair Progress towards PT goals: Progressing toward goals    Frequency    Min 2X/week      PT Plan Current plan remains appropriate    Co-evaluation PT/OT/SLP Co-Evaluation/Treatment: Yes Reason for Co-Treatment: Necessary to address cognition/behavior during functional activity;For patient/therapist safety;To address functional/ADL transfers PT goals addressed during session: Mobility/safety with mobility;Balance;Strengthening/ROM        AM-PAC PT "6 Clicks" Mobility   Outcome Measure  Help needed turning from your back to your side while in a flat bed without using bedrails?: A Little Help needed moving from lying on your back to sitting on the side of a flat bed without using bedrails?: A Little Help needed moving to and from a bed to a chair (including a wheelchair)?: Total Help needed standing up from a chair using your arms (e.g., wheelchair or bedside chair)?: A Lot Help needed to walk in hospital room?: Total Help needed climbing 3-5 steps with a railing? : Total 6 Click Score: 11    End of Session   Activity Tolerance: Other (comment) (limited by anxiety) Patient left: with call bell/phone within reach;in chair;with chair alarm set;with family/visitor present Nurse Communication: Mobility status;Other (comment) (pt  anxiety) PT Visit Diagnosis: Unsteadiness on feet (R26.81);Other abnormalities of gait and mobility (R26.89);Muscle weakness (generalized) (M62.81);Difficulty in walking, not elsewhere classified (R26.2)     Time: 7573-2256 PT Time Calculation (min) (ACUTE ONLY): 34 min  Charges:  $Therapeutic Activity: 8-22 mins                     Moishe Spice, PT, DPT Acute Rehabilitation Services  Pager: [phone] Office: [place] 11/29/2021, 3:25 PM

## 2021-11-30 ENCOUNTER — Other Ambulatory Visit (HOSPITAL_COMMUNITY): Payer: Medicare PPO

## 2021-11-30 DIAGNOSIS — M79671 Pain in right foot: Secondary | ICD-10-CM

## 2021-11-30 DIAGNOSIS — J690 Pneumonitis due to inhalation of food and vomit: Secondary | ICD-10-CM | POA: Diagnosis not present

## 2021-11-30 DIAGNOSIS — R531 Weakness: Secondary | ICD-10-CM | POA: Diagnosis not present

## 2021-11-30 DIAGNOSIS — N179 Acute kidney failure, unspecified: Secondary | ICD-10-CM | POA: Diagnosis not present

## 2021-11-30 DIAGNOSIS — I469 Cardiac arrest, cause unspecified: Secondary | ICD-10-CM | POA: Diagnosis not present

## 2021-11-30 DIAGNOSIS — L299 Pruritus, unspecified: Secondary | ICD-10-CM | POA: Diagnosis not present

## 2021-11-30 DIAGNOSIS — I2602 Saddle embolus of pulmonary artery with acute cor pulmonale: Secondary | ICD-10-CM | POA: Diagnosis not present

## 2021-11-30 LAB — CBC
HCT: 22.3 % — ABNORMAL LOW (ref 36.0–46.0)
Hemoglobin: 7.4 g/dL — ABNORMAL LOW (ref 12.0–15.0)
MCH: 29.7 pg (ref 26.0–34.0)
MCHC: 33.2 g/dL (ref 30.0–36.0)
MCV: 89.6 fL (ref 80.0–100.0)
Platelets: 77 10*3/uL — ABNORMAL LOW (ref 150–400)
RBC: 2.49 MIL/uL — ABNORMAL LOW (ref 3.87–5.11)
RDW: 16.6 % — ABNORMAL HIGH (ref 11.5–15.5)
WBC: 8.3 10*3/uL (ref 4.0–10.5)
nRBC: 0 % (ref 0.0–0.2)

## 2021-11-30 LAB — GLUCOSE, CAPILLARY
Glucose-Capillary: 131 mg/dL — ABNORMAL HIGH (ref 70–99)
Glucose-Capillary: 165 mg/dL — ABNORMAL HIGH (ref 70–99)
Glucose-Capillary: 171 mg/dL — ABNORMAL HIGH (ref 70–99)
Glucose-Capillary: 178 mg/dL — ABNORMAL HIGH (ref 70–99)
Glucose-Capillary: 257 mg/dL — ABNORMAL HIGH (ref 70–99)
Glucose-Capillary: 73 mg/dL (ref 70–99)

## 2021-11-30 LAB — URINE CULTURE: Culture: 10000 — AB

## 2021-11-30 LAB — RENAL FUNCTION PANEL
Albumin: 2.5 g/dL — ABNORMAL LOW (ref 3.5–5.0)
Anion gap: 11 (ref 5–15)
BUN: 39 mg/dL — ABNORMAL HIGH (ref 8–23)
CO2: 25 mmol/L (ref 22–32)
Calcium: 8.6 mg/dL — ABNORMAL LOW (ref 8.9–10.3)
Chloride: 97 mmol/L — ABNORMAL LOW (ref 98–111)
Creatinine, Ser: 4.04 mg/dL — ABNORMAL HIGH (ref 0.44–1.00)
GFR, Estimated: 11 mL/min — ABNORMAL LOW (ref >60)
Glucose, Bld: 99 mg/dL (ref 70–99)
Phosphorus: 2.8 mg/dL (ref 2.5–4.6)
Potassium: 4.6 mmol/L (ref 3.5–5.1)
Sodium: 133 mmol/L — ABNORMAL LOW (ref 135–145)

## 2021-11-30 LAB — MAGNESIUM: Magnesium: 1.9 mg/dL (ref 1.7–2.4)

## 2021-11-30 LAB — URIC ACID: Uric Acid, Serum: 3.7 mg/dL (ref 2.5–7.1)

## 2021-11-30 MED ORDER — TEMAZEPAM 15 MG PO CAPS: 15.0000 mg | ORAL_CAPSULE | Freq: Every evening | ORAL | Status: DC | PRN

## 2021-11-30 MED ORDER — CAMPHOR-MENTHOL 0.5-0.5 % EX LOTN
TOPICAL_LOTION | CUTANEOUS | Status: DC | PRN
  Filled 2021-11-30: qty 222

## 2021-11-30 MED ORDER — GABAPENTIN 300 MG PO CAPS
300.0000 mg | ORAL_CAPSULE | Freq: Two times a day (BID) | ORAL | Status: DC
  Administered 2021-11-30 – 2021-12-03 (×6): 300 mg via ORAL
  Filled 2021-11-30 (×6): qty 1

## 2021-11-30 NOTE — Progress Notes (Addendum)
Calorie Count Note  48 hour calorie count ordered. Calorie count has not been initiated yet.   Diet: dysphagia 3 with thin liquids (PO's: 10-20%) Supplements: Nepro Shake PO TID Tube feeding: Vital 1.5 at 75 ml/h from 6 PM to 8 AM.   Nutrition Dx: Severe Malnutrition related to acute illness as evidenced by moderate muscle depletion, energy intake < or equal to 50% for > or equal to 5 days, mild fat depletion.  Goal: Patient will meet greater than or equal to 90% of their needs  Intervention:  Calorie count over the weekend. Continue Nepro Shake PO TID. Continue nocturnal TF via Cortrak.   Lucas Mallow RD, LDN, CNSC Please refer to Amion for contact information.

## 2021-11-30 NOTE — Care Management Important Message (Signed)
Important Message  Patient Details  Name: [name] MRN: [id number] Date of Birth: February 16, 1951   Medicare Important Message Given:  Yes     Shelda Altes 11/30/2021, 8:58 AM
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
## 2021-11-30 NOTE — Plan of Care (Signed)
°  Problem: Clinical Measurements: Goal: Will remain free from infection Outcome: Progressing   Problem: Activity: Goal: Risk for activity intolerance will decrease Outcome: Progressing   Problem: Nutrition: Goal: Adequate nutrition will be maintained Outcome: Progressing   Problem: Coping: Goal: Level of anxiety will decrease Outcome: Progressing   Problem: Elimination: Goal: Will not experience complications related to urinary retention Outcome: Progressing
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
## 2021-11-30 NOTE — Progress Notes (Addendum)
[facility] Progress Note     Assessment/ Plan:   Renal failure secondary to ATN in the setting of cardiac arrest, hypotension, pressors, contrast.  BL CKD3A with BL creatinine in the 1.1-1.3 range in late 2021.  Has been RRT dependent since 1/4 - this was discussed with the patient and the family on 1/29.  Because they noted she made some urine output or permanent plans for outpatient dialysis and permanent access had been delayed.  She last had dialysis on 2/1.  Urine output has been higher this past week.  Holding dialysis today but likely plan for dialysis tomorrow if creatinine continues to rise.  We have started CLIP for AKI. Could consider calling ESRD 1 month since starting CRRT (on 2/4) but will keep a close eye on intradialytic creatinine before doing this. Particularly over the weekend. If she remains dependent may need an AVF/G.    VF cardiac arrest--> OOH VF arrest 2/2 massive PE  Massive PE s/p mechanical thrombectomy 1/2 of R and L PA.  Vasc US- no DVT, liver mass noted 10/28/21 CT abd/ pelvis, s/p MRI abd--> incompletely characterized, had MRI with gadolinium 1/20  to reevaluate -- not definitive.    Oncology following  Retroperitoneal bleed/Hemorrhagic shock/Acute blood loss anemia originating from left liver lobe s/p embolization 1/4--> required FFP, plts, Vit K, pRBCs; now stable. Hb in 8s----- 7's now.  Hematology following - they say no ESA due to concern for her recent thromboembolic disease. Transfuse when needed   Thrombocytopenia- suspects ITP, getting Nplate PRN and dex. -  work up and mgmt per heme.  Overall improved  Acute hypoxic RF- extubated 11/07/21, n now much improved  Sepsis/ leukocytosis/ aspiration pneumonia- MRI with aspiration PNA.  S/p  CTX and vanc   Bones- phos was high now improved-   PTH 129  10. Liver lesions - possible biopsy once platelets have improved.  11. PSA uti: present on culture. Pan sensitive. Treatment per  primary   Subjective:    Patient feels like she continues to urinate fairly well yesterday.  Urine output only documented to be 500.  Denies any other complaints.   Objective:   BP (!) 157/83 (BP Location: Left Arm)    Pulse 87    Temp 98 F (36.7 C) (Oral)    Resp 17    Ht 5\' 5"  (1.651 m)    Wt 74 kg    SpO2 97%    BMI 27.15 kg/m   Intake/Output Summary (Last 24 hours) at 11/30/2021 1033 Last data filed at 11/30/2021 0414 Gross per 24 hour  Intake 240 ml  Output 400 ml  Net -160 ml   Weight change:   Physical Exam: GEN: NAD,lying in bed HEENT:  sclerae anicteric, no nasal discharge NECK: Supple, no thyromegaly LUNGS: Normal work of breathing, bilateral chest rise CV: Reg rate ABD: soft, nontender EXT: Warm and well perfused, trace edema  Imaging: DG Ankle Complete Right  Result Date: 11/29/2021 CLINICAL DATA:  Pain, previous trauma EXAM: RIGHT ANKLE - COMPLETE 3+ VIEW COMPARISON:  11/19/2021 FINDINGS: No recent fracture or dislocation is seen. Small bony spurs seen in the tip of medial malleolus. Plantar spur is seen in the calcaneus. There is interval increase in soft tissue swelling around the right ankle. IMPRESSION: No fracture or dislocation is seen. There is soft tissue swelling around the ankle. Plantar spur is seen in calcaneus. Electronically Signed   By: Elmer Picker M.D.   On: 11/29/2021 16:18  Labs: BMET Recent Labs  Lab 11/24/21 0309 11/25/21 0405 11/26/21 0615 11/27/21 0243 11/28/21 0244 11/28/21 0246 11/29/21 0548 11/30/21 0309  NA 133* 129* 129*   130* 130*  --  129* 135 133*  K 4.6 5.1 5.5*   5.5* 4.2  --  4.7 4.2 4.6  CL 96* 95* 94*   94* 94*  --  94* 99 97*  CO2 25 24 23   23 26   --  24 27 25   GLUCOSE 130* 154* 100*   99 140*  --  170* 163* 99  BUN 43* 66* 86*   85* 28*  --  45* 26* 39*  CREATININE 3.69* 5.05* 6.50*   6.44* 3.25*  --  4.66* 2.95* 4.04*  CALCIUM 8.4* 8.4* 8.8*   8.8* 8.3*  --  8.5* 8.4* 8.6*  PHOS 4.3 4.5 4.4 3.5 4.0  --   2.7 2.8   CBC Recent Labs  Lab 11/27/21 0243 11/28/21 0246 11/29/21 0548 11/30/21 0309  WBC 8.7 8.7 7.8 8.3  HGB 7.9* 7.8* 7.8* 7.4*  HCT 23.9* 24.1* 24.2* 22.3*  MCV 88.5 89.3 89.6 89.6  PLT 42* 62* 63* 77*    Medications:     apixaban  5 mg Oral BID   atorvastatin  20 mg Oral Daily   B-complex with vitamin C  1 tablet Oral Daily   chlorhexidine  15 mL Mouth Rinse BID   Chlorhexidine Gluconate Cloth  6 each Topical Daily   Chlorhexidine Gluconate Cloth  6 each Topical Q0600   dicyclomine  10 mg Oral TID AC   DULoxetine  20 mg Oral Daily   feeding supplement (NEPRO CARB STEADY)  237 mL Oral TID BM   feeding supplement (VITAL 1.5 CAL)  1,000 mL Per Tube O97D   folic acid  2 mg Oral Daily   insulin aspart  0-9 Units Subcutaneous Q4H   insulin aspart  4 Units Subcutaneous Q4H   insulin detemir  8 Units Subcutaneous BID   mouth rinse  15 mL Mouth Rinse q12n4p   pantoprazole  40 mg Oral BID   sucralfate  1 g Oral TID    Reesa Chew  [facility]

## 2021-11-30 NOTE — Progress Notes (Signed)
Patient ID: HILARY MILKS, female   DOB: July 29, 1951, 71 y.o.   MRN: [id number]    Progress Note from the Palliative Medicine Team at [facility]   Patient Name: LATORRIA ZEOLI        Date: 11/30/2021 DOB: [birth year]  Age: 71 y.o. MRN#: [id number] Attending Physician: Kathie Dike, MD Primary Care Physician: [name], MD Admit Date: 10/28/2021   Medical records reviewed: Labs, provider notes  71 y.o. female   admitted on 10/28/2021 with   a history of fibromyalgia, asthma, HTN who presented after a presyncopal episode at home where her daughter lowered her to the ground. She regained some consciousness and told her daughter she felt like her BG was low. She lost consciousness right before EMS arrived. When EMS arrived she was in VF and had 20 min of CPR. She received shocks, amiodarone, epinephrine. She coded twice in the ED, 4 min and 2 min.  Initial EKG junctional rhythm, concerning for STEMI, but due to electrolyte abnormalities and acidosis, was not a candidate for heart catherization at this time. Possible seizure in the ED   Complicated hospitalization.  Today is day 57 of this hospital stay.   1/1 Admit post VF arrest  1/2 S/p mechanical thrombectomy of right and left PA 1/3 Weaned to low dose epi and levo. In the evening increased pressor requirement and Hg drop to ~5. Found with large intraperitoneal/intraparenchymal hemorrhage extending into left subcapsular hematoma. Received PRBC x 5, FFP x 5 and Plt x 1 1/4 Overnight worsening pressor requirement and Hg ~4. Transfused PRBC x 3, FFP x 2 and platelet x 2. S/p embolization of left hepatic lobe 1/8 meropenem and vanc started, CT head WNL 1/10 extubated 1/11 2 platelet transfusions, consult Hematology 1/12 low gr fever , 1 unit platelet transfused, underwent HD, less responsive >>stat head CT neg ,Coughed up blood clot  1/12 MRI abdomen large area of intrahepatic hemorrhage, cannot exclude underlying mass lesion without  contrast, distended gallbladder, intraperitoneal hemorrhage, bilateral lower lobe pneumonia 1/13 Transfuse 1 unit of blood 2 units of platelets 1/14 Episodes of respiratory distress with wheezing 1/15 Multiple episodes of respiratory distress with wheezing, requiring transient BiPAP, NT suctioning with bleeding, Started on Precedex for severe anxiety/agitation Issues with HD catheter not working, suboptimal HD for 2 hours Afebrile Transient hypotension improved, sugars very high and restarted on insulin drip. CRRT resumed 1/16 still on high FIO2 80% heated high flow. PLTs down again to [facility] 1/17 Platelets up to 11.  Delirium overnight noted.  Not sleeping. 1/18 Plt 14K, continues on low-dose NE and CRRT. Plan for transition to iHD. 1/19 Plt 12K, off of pressors, CRRT stopped. Possible transition to iHD today. Insulin gtt to SQ. Dexamethasone to end today. 1/20 Hyperglycemic to 400s overnight off of insulin gtt, resumed. MRI Liver today to characterize liver lesions/assess for malignancy. Tolerated iHD. Midodrine increased. 1/ 26-plan is for bone marrow biopsy tomorrow 2/3- platelets improving slowing, she I weak and deconditioned   Patient and her family face treatment option decisions, advanced directive decisions and anticipatory care needs.  Patient is awake and oriented, she is lethargic.  Education offered on importance of movement and mobility.    She c/o itching and left foot pain.  She thinks the itching coming form her pain medicine, recommendation to try  different med but she doesn't want to try she "uses Benadryl and that works"..      -ordered Sarna creme On review she was on Gabapentin at home  and currently is not, discussed with attending and will restart.       -Gabapentin 300 mg po bid   Questions and concerns addressed .   PMT will continue to support holistically     Discussed with Dr Roderic Palau    Wadie Lessen NP  Palliative Medicine Team Team Phone # [phone] Pager  [phone]

## 2021-11-30 NOTE — Progress Notes (Signed)
Inpatient Rehabilitation Admissions Coordinator  ° °I met at bedside with patient and her daughter. I further explained that CIR is not an option at this time for unable to tolerate the intensity required. Daughter aware SNF is recommended, but also asking if home health could be considered. I updated TOC RN CM, Kristi, and we will sign off at this time. ° ° , RN, MSN °Rehab Admissions Coordinator °[phone] °11/30/2021 1:03 PM ° °

## 2021-11-30 NOTE — Progress Notes (Signed)
Continue to follow pt's case to assist with out-pt HD arrangements once d/c disposition is known. If pt admits to snf, pt will need to be clipped to clinic that snf transports to. Will assist as needed.   Melven Sartorius Renal Navigator [phone]

## 2021-12-01 DIAGNOSIS — I2602 Saddle embolus of pulmonary artery with acute cor pulmonale: Secondary | ICD-10-CM | POA: Diagnosis not present

## 2021-12-01 DIAGNOSIS — I469 Cardiac arrest, cause unspecified: Secondary | ICD-10-CM | POA: Diagnosis not present

## 2021-12-01 DIAGNOSIS — J690 Pneumonitis due to inhalation of food and vomit: Secondary | ICD-10-CM | POA: Diagnosis not present

## 2021-12-01 DIAGNOSIS — N179 Acute kidney failure, unspecified: Secondary | ICD-10-CM | POA: Diagnosis not present

## 2021-12-01 LAB — GLUCOSE, CAPILLARY
Glucose-Capillary: 130 mg/dL — ABNORMAL HIGH (ref 70–99)
Glucose-Capillary: 158 mg/dL — ABNORMAL HIGH (ref 70–99)
Glucose-Capillary: 198 mg/dL — ABNORMAL HIGH (ref 70–99)
Glucose-Capillary: 228 mg/dL — ABNORMAL HIGH (ref 70–99)
Glucose-Capillary: 269 mg/dL — ABNORMAL HIGH (ref 70–99)
Glucose-Capillary: 89 mg/dL (ref 70–99)

## 2021-12-01 LAB — RENAL FUNCTION PANEL
Albumin: 2.5 g/dL — ABNORMAL LOW (ref 3.5–5.0)
Anion gap: 12 (ref 5–15)
BUN: 56 mg/dL — ABNORMAL HIGH (ref 8–23)
CO2: 24 mmol/L (ref 22–32)
Calcium: 8.7 mg/dL — ABNORMAL LOW (ref 8.9–10.3)
Chloride: 97 mmol/L — ABNORMAL LOW (ref 98–111)
Creatinine, Ser: 5.02 mg/dL — ABNORMAL HIGH (ref 0.44–1.00)
GFR, Estimated: 9 mL/min — ABNORMAL LOW (ref >60)
Glucose, Bld: 182 mg/dL — ABNORMAL HIGH (ref 70–99)
Phosphorus: 3 mg/dL (ref 2.5–4.6)
Potassium: 5.3 mmol/L — ABNORMAL HIGH (ref 3.5–5.1)
Sodium: 133 mmol/L — ABNORMAL LOW (ref 135–145)

## 2021-12-01 LAB — CBC
HCT: 22.7 % — ABNORMAL LOW (ref 36.0–46.0)
Hemoglobin: 7.2 g/dL — ABNORMAL LOW (ref 12.0–15.0)
MCH: 28.7 pg (ref 26.0–34.0)
MCHC: 31.7 g/dL (ref 30.0–36.0)
MCV: 90.4 fL (ref 80.0–100.0)
Platelets: 87 10*3/uL — ABNORMAL LOW (ref 150–400)
RBC: 2.51 MIL/uL — ABNORMAL LOW (ref 3.87–5.11)
RDW: 16.3 % — ABNORMAL HIGH (ref 11.5–15.5)
WBC: 9.2 10*3/uL (ref 4.0–10.5)
nRBC: 0 % (ref 0.0–0.2)

## 2021-12-01 LAB — MAGNESIUM: Magnesium: 2.1 mg/dL (ref 1.7–2.4)

## 2021-12-01 NOTE — Plan of Care (Signed)
  Problem: Activity: Goal: Risk for activity intolerance will decrease Outcome: Progressing   Problem: Nutrition: Goal: Adequate nutrition will be maintained Outcome: Progressing   Problem: Coping: Goal: Level of anxiety will decrease Outcome: Progressing   

## 2021-12-01 NOTE — Progress Notes (Signed)
Thankfully, her platelet count continues to improve.  It is now 87,000.  Again, I think that her bone marrow has began to have a little bit better functioning.  Her white cell count is 9.2.  Hemoglobin 7.2.  She is getting dialysis today.  Her BUN and creatinine are 56 and 5.02.  Think she is making some urine.  Her albumin is also trending upward which I think goes along with her nutritional state.  There is no bleeding.  She is on Eliquis for the thromboembolic disease.  Again, I am just happy that the platelet count is improving.  As far as her thromboembolic disease is concerned, we will have to check her down the road with respect to the lupus anticoagulant see if this is truly positive.   [name], MD  [place] 14:14

## 2021-12-01 NOTE — Progress Notes (Signed)
[facility] Progress Note     Assessment/ Plan:   Renal failure secondary to ATN in the setting of cardiac arrest, hypotension, pressors, contrast.  BL CKD3A with BL creatinine in the 1.1-1.3 range in late 2021.  Has been RRT dependent since 1/4 - this was discussed with the patient and the family on 1/29.  Because they noted she made some urine output or permanent plans for outpatient dialysis and permanent access had been delayed.  She last had dialysis on 2/1.  Urine output has been higher this past week but not well documented at times.  We have started CLIP for AKI. Could consider calling ESRD 1 month since starting CRRT (on 2/4) but will keep a close eye on intradialytic creatinine before doing this. If she remains dependent may need an AVF/G.  Planning for dialysis today and then watch for a couple days to see what her creatinine does in between dialysis.    VF cardiac arrest--> OOH VF arrest 2/2 massive PE  Massive PE s/p mechanical thrombectomy 1/2 of R and L PA.  Vasc US- no DVT, liver mass noted 10/28/21 CT abd/ pelvis, s/p MRI abd--> incompletely characterized, had MRI with gadolinium 1/20  to reevaluate -- not definitive.    Oncology following  Retroperitoneal bleed/Hemorrhagic shock/Acute blood loss anemia originating from left liver lobe s/p embolization 1/4--> required FFP, plts, Vit K, pRBCs; now stable. Hb in 8s----- 7's now.  Hematology following - no ESA due to concern for her recent thromboembolic disease. Transfuse when needed   Thrombocytopenia- suspects ITP, getting Nplate PRN and dex. -  work up and mgmt per heme.  Overall improved  Acute hypoxic RF- extubated 11/07/21, n now much improved  Sepsis/ leukocytosis/ aspiration pneumonia- MRI with aspiration PNA.  S/p  CTX and vanc   Bones- phos was high now improved-   PTH 129  10. Liver lesions - possible biopsy once patient is more stable  11. PSA uti: present on culture. Pan sensitive. Treatment per  primary   Subjective:    Patient feels okay today.  Urine output not documented well.  Creatinine continues to rise in between dialysis   Objective:   BP (!) 158/86 (BP Location: Left Arm)    Pulse 88    Temp 99.8 F (37.7 C) (Oral)    Resp 17    Ht 5\' 5"  (1.651 m)    Wt 74.8 kg    SpO2 99%    BMI 27.44 kg/m   Intake/Output Summary (Last 24 hours) at 12/01/2021 1610 Last data filed at 12/01/2021 0700 Gross per 24 hour  Intake --  Output 1000 ml  Net -1000 ml   Weight change:   Physical Exam: GEN: NAD,lying in bed HEENT:  sclerae anicteric, no nasal discharge NECK: Supple, no thyromegaly LUNGS: Normal work of breathing, bilateral chest rise CV: Reg rate ABD: soft, nontender EXT: Warm and well perfused, trace edema  Imaging: DG Ankle Complete Right  Result Date: 11/29/2021 CLINICAL DATA:  Pain, previous trauma EXAM: RIGHT ANKLE - COMPLETE 3+ VIEW COMPARISON:  11/19/2021 FINDINGS: No recent fracture or dislocation is seen. Small bony spurs seen in the tip of medial malleolus. Plantar spur is seen in the calcaneus. There is interval increase in soft tissue swelling around the right ankle. IMPRESSION: No fracture or dislocation is seen. There is soft tissue swelling around the ankle. Plantar spur is seen in calcaneus. Electronically Signed   By: Elmer Picker M.D.   On: 11/29/2021 16:18  Labs: BMET Recent Labs  Lab 11/25/21 0405 11/26/21 0615 11/27/21 0243 11/28/21 0244 11/28/21 0246 11/29/21 0548 11/30/21 0309 12/01/21 0147  NA 129* 129*   130* 130*  --  129* 135 133* 133*  K 5.1 5.5*   5.5* 4.2  --  4.7 4.2 4.6 5.3*  CL 95* 94*   94* 94*  --  94* 99 97* 97*  CO2 24 23   23 26   --  24 27 25 24   GLUCOSE 154* 100*   99 140*  --  170* 163* 99 182*  BUN 66* 86*   85* 28*  --  45* 26* 39* 56*  CREATININE 5.05* 6.50*   6.44* 3.25*  --  4.66* 2.95* 4.04* 5.02*  CALCIUM 8.4* 8.8*   8.8* 8.3*  --  8.5* 8.4* 8.6* 8.7*  PHOS 4.5 4.4 3.5 4.0  --  2.7 2.8 3.0   CBC Recent  Labs  Lab 11/28/21 0246 11/29/21 0548 11/30/21 0309 12/01/21 0147  WBC 8.7 7.8 8.3 9.2  HGB 7.8* 7.8* 7.4* 7.2*  HCT 24.1* 24.2* 22.3* 22.7*  MCV 89.3 89.6 89.6 90.4  PLT 62* 63* 77* 87*    Medications:     apixaban  5 mg Oral BID   atorvastatin  20 mg Oral Daily   B-complex with vitamin C  1 tablet Oral Daily   chlorhexidine  15 mL Mouth Rinse BID   Chlorhexidine Gluconate Cloth  6 each Topical Daily   Chlorhexidine Gluconate Cloth  6 each Topical Q0600   dicyclomine  10 mg Oral TID AC   DULoxetine  20 mg Oral Daily   feeding supplement (NEPRO CARB STEADY)  237 mL Oral TID BM   feeding supplement (VITAL 1.5 CAL)  1,000 mL Per Tube A41Y   folic acid  2 mg Oral Daily   gabapentin  300 mg Oral BID   insulin aspart  0-9 Units Subcutaneous Q4H   insulin aspart  4 Units Subcutaneous Q4H   insulin detemir  8 Units Subcutaneous BID   mouth rinse  15 mL Mouth Rinse q12n4p   pantoprazole  40 mg Oral BID   sucralfate  1 g Oral TID    [facility]

## 2021-12-01 NOTE — Progress Notes (Signed)
°   12/01/21 1107  Vitals  Temp 98.4 F (36.9 C)  Temp Source Oral  BP (!) 158/81  BP Location Left Arm  BP Method Automatic  Patient Position (if appropriate) Lying  Pulse Rate 95  Pulse Rate Source Monitor  Resp 16  Oxygen Therapy  SpO2 97 %  O2 Device Room Air  Dialysis Weight  Weight 74.5 kg  Type of Weight Post-Dialysis  Post-Hemodialysis Assessment  Rinseback Volume (mL) 250 mL  KECN 279 V  Dialyzer Clearance Lightly streaked  Duration of HD Treatment -hour(s) 3.5 hour(s)  Hemodialysis Intake (mL) 500 mL  UF Total -Machine (mL) 500 mL  Net UF (mL) 0 mL  Tolerated HD Treatment Yes  Post-Hemodialysis Comments tx complete-pt stable  Hemodialysis Catheter Right Internal jugular Double lumen Permanent (Tunneled)  Placement Date/Time: 11/20/21 1421   Placed prior to admission: Yes  Time Out: Correct patient;Correct site;Correct procedure  Maximum sterile barrier precautions: Hand hygiene;Cap;Mask;Sterile gown;Sterile gloves;Large sterile sheet  Site Prep: Chlor...  Site Condition No complications  Blue Lumen Status Flushed  Red Lumen Status Flushed  Purple Lumen Status N/A  Catheter fill solution 4% Sodium Citrate  Catheter fill volume (Arterial) 1.6 cc  Catheter fill volume (Venous) 1.6  Dressing Type Transparent  Dressing Status Clean;Dry;Intact  Antimicrobial disc in place? Yes  Drainage Description None  Dressing Change Due 12/03/21  Post treatment catheter status Capped and Clamped   Hd tx complete, no problems noted.
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
## 2021-12-02 ENCOUNTER — Inpatient Hospital Stay (HOSPITAL_COMMUNITY): Payer: Medicare PPO

## 2021-12-02 DIAGNOSIS — J690 Pneumonitis due to inhalation of food and vomit: Secondary | ICD-10-CM | POA: Diagnosis not present

## 2021-12-02 DIAGNOSIS — I469 Cardiac arrest, cause unspecified: Secondary | ICD-10-CM | POA: Diagnosis not present

## 2021-12-02 DIAGNOSIS — Z515 Encounter for palliative care: Secondary | ICD-10-CM | POA: Diagnosis not present

## 2021-12-02 DIAGNOSIS — N179 Acute kidney failure, unspecified: Secondary | ICD-10-CM | POA: Diagnosis not present

## 2021-12-02 DIAGNOSIS — I2602 Saddle embolus of pulmonary artery with acute cor pulmonale: Secondary | ICD-10-CM | POA: Diagnosis not present

## 2021-12-02 DIAGNOSIS — R638 Other symptoms and signs concerning food and fluid intake: Secondary | ICD-10-CM

## 2021-12-02 DIAGNOSIS — E43 Unspecified severe protein-calorie malnutrition: Secondary | ICD-10-CM | POA: Diagnosis not present

## 2021-12-02 DIAGNOSIS — R531 Weakness: Secondary | ICD-10-CM | POA: Diagnosis not present

## 2021-12-02 LAB — CBC
HCT: 22.9 % — ABNORMAL LOW (ref 36.0–46.0)
Hemoglobin: 7.4 g/dL — ABNORMAL LOW (ref 12.0–15.0)
MCH: 29.1 pg (ref 26.0–34.0)
MCHC: 32.3 g/dL (ref 30.0–36.0)
MCV: 90.2 fL (ref 80.0–100.0)
Platelets: 61 10*3/uL — ABNORMAL LOW (ref 150–400)
RBC: 2.54 MIL/uL — ABNORMAL LOW (ref 3.87–5.11)
RDW: 16.3 % — ABNORMAL HIGH (ref 11.5–15.5)
WBC: 8.2 10*3/uL (ref 4.0–10.5)
nRBC: 0 % (ref 0.0–0.2)

## 2021-12-02 LAB — RENAL FUNCTION PANEL
Albumin: 2.4 g/dL — ABNORMAL LOW (ref 3.5–5.0)
Anion gap: 10 (ref 5–15)
BUN: 29 mg/dL — ABNORMAL HIGH (ref 8–23)
CO2: 27 mmol/L (ref 22–32)
Calcium: 8.2 mg/dL — ABNORMAL LOW (ref 8.9–10.3)
Chloride: 95 mmol/L — ABNORMAL LOW (ref 98–111)
Creatinine, Ser: 2.96 mg/dL — ABNORMAL HIGH (ref 0.44–1.00)
GFR, Estimated: 16 mL/min — ABNORMAL LOW (ref >60)
Glucose, Bld: 290 mg/dL — ABNORMAL HIGH (ref 70–99)
Phosphorus: 3.2 mg/dL (ref 2.5–4.6)
Potassium: 4.9 mmol/L (ref 3.5–5.1)
Sodium: 132 mmol/L — ABNORMAL LOW (ref 135–145)

## 2021-12-02 LAB — GLUCOSE, CAPILLARY
Glucose-Capillary: 322 mg/dL — ABNORMAL HIGH (ref 70–99)
Glucose-Capillary: 124 mg/dL — ABNORMAL HIGH (ref 70–99)
Glucose-Capillary: 103 mg/dL — ABNORMAL HIGH (ref 70–99)
Glucose-Capillary: 101 mg/dL — ABNORMAL HIGH (ref 70–99)
Glucose-Capillary: 164 mg/dL — ABNORMAL HIGH (ref 70–99)

## 2021-12-02 LAB — MAGNESIUM: Magnesium: 1.8 mg/dL (ref 1.7–2.4)
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
## 2021-12-02 NOTE — Progress Notes (Signed)
Mobility Specialist Progress Note    12/02/21 1308  Mobility  Bed Position Chair  Activity Transferred from bed to chair  Level of Assistance +2 (takes two people)  Games developer wheel walker  Distance Ambulated (ft) 3 ft  Activity Response Tolerated fair  $Mobility charge 1 Mobility   Pt received in bed and agreeable. Pt needs cueing for hand placement. ModAx2 to stand. Needed consistent cueing for trunk flexion. Had BM upon standing. STS x6 throughout. Pt would not step forward and trunk flexion increased so pivoted to chair. Left with call bell in reach and chair alarm on.   [facility] Specialist  M.S. 2C and 6E: [phone] M.S. 4E: [phone]

## 2021-12-02 NOTE — Progress Notes (Signed)
[facility] Progress Note     Assessment/ Plan:   Renal failure secondary to ATN in the setting of cardiac arrest, hypotension, pressors, contrast.  BL CKD3A with BL creatinine in the 1.1-1.3 range in late 2021.  Has been RRT dependent since 1/4 - this was discussed with the patient and the family on 1/29.  Because they noted she made some urine output or permanent plans for outpatient dialysis and permanent access had been delayed.  She last had dialysis on 2/1.  Urine output has been higher this past week but not well documented at times.  We have started CLIP for AKI. Could consider calling ESRD 1 month since starting CRRT (on 2/4) but will keep a close eye on intradialytic creatinine before doing this. If she remains dependent may need an AVF/G.  Hold on dialysis for now and watch Crt/UOP.    VF cardiac arrest--> OOH VF arrest 2/2 massive PE  Massive PE s/p mechanical thrombectomy 1/2 of R and L PA.  Vasc US- no DVT, liver mass noted 10/28/21 CT abd/ pelvis, s/p MRI abd--> incompletely characterized, had MRI with gadolinium 1/20  to reevaluate -- not definitive.    Oncology following  Retroperitoneal bleed/Hemorrhagic shock/Acute blood loss anemia originating from left liver lobe s/p embolization 1/4--> required FFP, plts, Vit K, pRBCs; now stable. Hb in 8s----- 7's now.  Hematology following - no ESA due to concern for her recent thromboembolic disease. Transfuse when needed   Thrombocytopenia- suspects ITP, getting Nplate PRN and dex. -  work up and mgmt per heme.  Overall improved  Acute hypoxic RF- extubated 11/07/21, n now much improved  Sepsis/ leukocytosis/ aspiration pneumonia- MRI with aspiration PNA.  S/p  CTX and vanc   Bones- phos was high now improved-   PTH 129  10. Liver lesions - possible biopsy once patient is more stable  11. PSA uti: present on culture. Pan sensitive. Treatment per primary   Subjective:    Patient feels well today with no  complaints.  Dialysis went well yesterday with no issues.  1200 cc of urine made yesterday   Objective:   BP (!) 141/77 (BP Location: Right Arm)    Pulse (!) 103    Temp 98.6 F (37 C) (Oral)    Resp 16    Ht 5\' 5"  (1.651 m)    Wt 74.5 kg    SpO2 100%    BMI 27.33 kg/m   Intake/Output Summary (Last 24 hours) at 12/02/2021 1025 Last data filed at 12/02/2021 0418 Gross per 24 hour  Intake 377 ml  Output 1200 ml  Net -823 ml   Weight change:   Physical Exam: GEN: NAD,lying in bed HEENT:  sclerae anicteric, no nasal discharge NECK: Supple, no thyromegaly LUNGS: Normal work of breathing, bilateral chest rise CV: Reg rate ABD: soft, nontender EXT: Warm and well perfused, trace edema  Imaging: No results found.  Labs: BMET Recent Labs  Lab 11/26/21 0615 11/27/21 0243 11/28/21 0244 11/28/21 0246 11/29/21 0548 11/30/21 0309 12/01/21 0147 12/02/21 0159  NA 129*   130* 130*  --  129* 135 133* 133* 132*  K 5.5*   5.5* 4.2  --  4.7 4.2 4.6 5.3* 4.9  CL 94*   94* 94*  --  94* 99 97* 97* 95*  CO2 23   23 26   --  24 27 25 24 27   GLUCOSE 100*   99 140*  --  170* 163* 99 182* 290*  BUN  86*   85* 28*  --  45* 26* 39* 56* 29*  CREATININE 6.50*   6.44* 3.25*  --  4.66* 2.95* 4.04* 5.02* 2.96*  CALCIUM 8.8*   8.8* 8.3*  --  8.5* 8.4* 8.6* 8.7* 8.2*  PHOS 4.4 3.5 4.0  --  2.7 2.8 3.0 3.2   CBC Recent Labs  Lab 11/29/21 0548 11/30/21 0309 12/01/21 0147 12/02/21 0159  WBC 7.8 8.3 9.2 8.2  HGB 7.8* 7.4* 7.2* 7.4*  HCT 24.2* 22.3* 22.7* 22.9*  MCV 89.6 89.6 90.4 90.2  PLT 63* 77* 87* 61*    Medications:     apixaban  5 mg Oral BID   atorvastatin  20 mg Oral Daily   B-complex with vitamin C  1 tablet Oral Daily   chlorhexidine  15 mL Mouth Rinse BID   Chlorhexidine Gluconate Cloth  6 each Topical Daily   Chlorhexidine Gluconate Cloth  6 each Topical Q0600   dicyclomine  10 mg Oral TID AC   DULoxetine  20 mg Oral Daily   feeding supplement (NEPRO CARB STEADY)  237 mL Oral TID  BM   feeding supplement (VITAL 1.5 CAL)  1,000 mL Per Tube S82L   folic acid  2 mg Oral Daily   gabapentin  300 mg Oral BID   insulin aspart  0-9 Units Subcutaneous Q4H   insulin aspart  4 Units Subcutaneous Q4H   insulin detemir  8 Units Subcutaneous BID   mouth rinse  15 mL Mouth Rinse q12n4p   pantoprazole  40 mg Oral BID   sucralfate  1 g Oral TID    Reesa Chew  [facility]

## 2021-12-02 NOTE — Progress Notes (Signed)
Mobility Specialist Progress Note    12/02/21 1649  Mobility  Activity Transferred from chair to bed  Level of Assistance +2 (takes two people)  Games developer wheel walker  Distance Ambulated (ft) 2 ft  Activity Response Tolerated fair  $Mobility charge 1 Mobility   Pt modAx2 to stand and needed cueing for trunk flexion during pivot and lateral steps at EOB. Left with NT and daughter present.   [facility] Mobility Specialist  M.S. 2C and 6E: [phone] M.S. 4E: [phone]

## 2021-12-03 DIAGNOSIS — I469 Cardiac arrest, cause unspecified: Secondary | ICD-10-CM | POA: Diagnosis not present

## 2021-12-03 DIAGNOSIS — N179 Acute kidney failure, unspecified: Secondary | ICD-10-CM | POA: Diagnosis not present

## 2021-12-03 DIAGNOSIS — J690 Pneumonitis due to inhalation of food and vomit: Secondary | ICD-10-CM | POA: Diagnosis not present

## 2021-12-03 DIAGNOSIS — I2602 Saddle embolus of pulmonary artery with acute cor pulmonale: Secondary | ICD-10-CM | POA: Diagnosis not present

## 2021-12-03 LAB — CBC WITH DIFFERENTIAL/PLATELET
Abs Immature Granulocytes: 0.06 10*3/uL (ref 0.00–0.07)
Basophils Absolute: 0.1 10*3/uL (ref 0.0–0.1)
Basophils Relative: 1 %
Eosinophils Absolute: 0.2 10*3/uL (ref 0.0–0.5)
Eosinophils Relative: 2 %
HCT: 23 % — ABNORMAL LOW (ref 36.0–46.0)
Hemoglobin: 7.3 g/dL — ABNORMAL LOW (ref 12.0–15.0)
Immature Granulocytes: 1 %
Lymphocytes Relative: 13 %
Lymphs Abs: 1.2 10*3/uL (ref 0.7–4.0)
MCH: 28.6 pg (ref 26.0–34.0)
MCHC: 31.7 g/dL (ref 30.0–36.0)
MCV: 90.2 fL (ref 80.0–100.0)
Monocytes Absolute: 1.1 10*3/uL — ABNORMAL HIGH (ref 0.1–1.0)
Monocytes Relative: 11 %
Neutro Abs: 7.1 10*3/uL (ref 1.7–7.7)
Neutrophils Relative %: 72 %
Platelets: 78 10*3/uL — ABNORMAL LOW (ref 150–400)
RBC: 2.55 MIL/uL — ABNORMAL LOW (ref 3.87–5.11)
RDW: 15.9 % — ABNORMAL HIGH (ref 11.5–15.5)
WBC: 9.7 10*3/uL (ref 4.0–10.5)
nRBC: 0 % (ref 0.0–0.2)

## 2021-12-03 LAB — GLUCOSE, CAPILLARY
Glucose-Capillary: 104 mg/dL — ABNORMAL HIGH (ref 70–99)
Glucose-Capillary: 111 mg/dL — ABNORMAL HIGH (ref 70–99)
Glucose-Capillary: 145 mg/dL — ABNORMAL HIGH (ref 70–99)
Glucose-Capillary: 59 mg/dL — ABNORMAL LOW (ref 70–99)
Glucose-Capillary: 62 mg/dL — ABNORMAL LOW (ref 70–99)
Glucose-Capillary: 65 mg/dL — ABNORMAL LOW (ref 70–99)
Glucose-Capillary: 74 mg/dL (ref 70–99)
Glucose-Capillary: 81 mg/dL (ref 70–99)
Glucose-Capillary: 94 mg/dL (ref 70–99)

## 2021-12-03 LAB — RENAL FUNCTION PANEL
Albumin: 2.5 g/dL — ABNORMAL LOW (ref 3.5–5.0)
Anion gap: 12 (ref 5–15)
BUN: 45 mg/dL — ABNORMAL HIGH (ref 8–23)
CO2: 27 mmol/L (ref 22–32)
Calcium: 8.8 mg/dL — ABNORMAL LOW (ref 8.9–10.3)
Chloride: 95 mmol/L — ABNORMAL LOW (ref 98–111)
Creatinine, Ser: 4.45 mg/dL — ABNORMAL HIGH (ref 0.44–1.00)
GFR, Estimated: 10 mL/min — ABNORMAL LOW (ref >60)
Glucose, Bld: 98 mg/dL (ref 70–99)
Phosphorus: 4.3 mg/dL (ref 2.5–4.6)
Potassium: 5.3 mmol/L — ABNORMAL HIGH (ref 3.5–5.1)
Sodium: 134 mmol/L — ABNORMAL LOW (ref 135–145)

## 2021-12-03 LAB — MAGNESIUM: Magnesium: 2 mg/dL (ref 1.7–2.4)

## 2021-12-03 MED ORDER — MUPIROCIN CALCIUM 2 % EX CREA
1.0000 "application " | TOPICAL_CREAM | Freq: Two times a day (BID) | CUTANEOUS | Status: DC
  Administered 2021-12-03 – 2021-12-04 (×2): 1 via TOPICAL
  Filled 2021-12-03: qty 15

## 2021-12-03 MED ORDER — SODIUM ZIRCONIUM CYCLOSILICATE 10 G PO PACK
10.0000 g | PACK | Freq: Once | ORAL | Status: AC
  Administered 2021-12-03: 10 g via ORAL
  Filled 2021-12-03: qty 1

## 2021-12-03 MED ORDER — INSULIN DETEMIR 100 UNIT/ML ~~LOC~~ SOLN
4.0000 [IU] | Freq: Two times a day (BID) | SUBCUTANEOUS | Status: DC
  Administered 2021-12-03 – 2021-12-04 (×2): 4 [IU] via SUBCUTANEOUS
  Filled 2021-12-03 (×3): qty 0.04

## 2021-12-03 MED ORDER — GABAPENTIN 100 MG PO CAPS
100.0000 mg | ORAL_CAPSULE | Freq: Two times a day (BID) | ORAL | Status: DC
  Administered 2021-12-03 – 2021-12-04 (×2): 100 mg via ORAL
  Filled 2021-12-03 (×2): qty 1

## 2021-12-03 MED ORDER — ALUM & MAG HYDROXIDE-SIMETH 200-200-20 MG/5ML PO SUSP
30.0000 mL | Freq: Four times a day (QID) | ORAL | Status: DC | PRN
  Administered 2021-12-03: 30 mL via ORAL
  Filled 2021-12-03: qty 30

## 2021-12-03 MED ORDER — CHLORHEXIDINE GLUCONATE CLOTH 2 % EX PADS
6.0000 | MEDICATED_PAD | Freq: Every day | CUTANEOUS | Status: DC
  Administered 2021-12-04: 6 via TOPICAL

## 2021-12-03 NOTE — Progress Notes (Signed)
Mobility Specialist Progress Note   12/03/21 1050  Mobility  Activity Transferred from chair to bed  Level of Assistance Maximum assist, patient does 25-49%  Assistive Device Front wheel walker  Distance Ambulated (ft) 2 ft  Activity Response Tolerated fair  $Mobility charge 1 Mobility   Received pt in chair c/o slight R calf pain but requesting to get back in bed. MaxA for STS and modA for SPT to EOB. Requiring heavy cues for hip extension when in the RW, unable to fully extend hips resulting in a squat pivot tx. Pt soiled themselves while getting placed in bed. NT notified for pericare.  [place]land Falling Mobility Specialist Phone Number [phone]

## 2021-12-03 NOTE — Progress Notes (Signed)
So far, here with has been holding pretty steady for [name].  She still has a feeding tube in.  We will immunize to get this.  However, does not sound like she is doing all that well taking in enough calories.  I think she has a calorie count going right now.  Her platelet count has been trending upward.  However, last placed were checked, the platelet count was down a little bit at 61,000.  She has had no obvious bleeding.  I think that the platelets will continue to improve over time.  She is not making urine for which she tells me.  She is still on hemodialysis.  It sounds like hemodialysis might be long-term.  We still have her on Eliquis.  She will be on long-term anticoagulation.  We will have to recheck a lupus anticoagulant panel on her down the road.  If she is still positive for the lupus anticoagulant, we will have to be very cautious with respect to further blood clots.  It be nice to get her on Coumadin however, with renal failure, Coumadin would be very difficult to manage.  We decided to watch the liver.  Again, I would think that another MRI of the liver probably would be the way to go to see what is in there.  I would probably hold off on this for another few weeks.  I do not see how this would be a problem so that we can get a optimal study.  There is no labs on her yet today.  I am not sure if she will be going to rehab or if she is going home with physical therapy.  I know her family would be very involved with her care at home.  There was been very involved while she has been in the hospital.  Her vital signs show temperature 98.6.  Pulse 85.  Blood pressure 144/85.  Her lungs sound relatively clear bilaterally.  She has good air movement bilaterally.  Cardiac exam regular rate and rhythm.  I do not hear any murmurs.  Abdomen is soft.  Bowel sounds are present.  There is no guarding or rebound tenderness.  Extremities shows no clubbing, cyanosis or edema.  Again, [name]  has a massive pulmonary embolism.  She has a cardiac arrest.  She does have what I would think is a hypercoagulable state.  Whether or not this is a antiphospholipid antibody is not clear right now.  Again she has this mass in her liver which hopefully is not malignant.  She has come along way since I first saw her a month ago.  Hopefully, should be able to go home and ultimately be off dialysis and be eating well.  I know that she is got incredible care from everybody on [address].  [name], MD  [facility] 18:27
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
## 2021-12-03 NOTE — Progress Notes (Addendum)
Physical Therapy Treatment Patient Details Name: [name] MRN: [id number] DOB: 23-Jan-1951 Today's Date: 12/03/2021   History of Present Illness 71 y.o. female admitted 10/28/21 after cardiac arrest requiring 20-min CPR for ROSC; ETT in the field. Pt had 2x additional codes in ED requiring CPR for 4 and 2-min. Workup for STEMI, cardiogenic shock, large saddle PE, RUQ bleeding, 2 rib fxs. S/p PE thrombectomy 1/2. Pt with intraperitoneal hemorrhage s/p embolization 1/5. ETT 1/1-1/10. S/p L femoral non-tunneled HD catheter placed 1/4. CRRT 1/4-1/11; trialled iHD 1/12; CRRT again 1/15-1/18. Began iHD again 1/20. S/p RIJ tunneled HD catheter placed 1/24. Pt with persistent thrombocytopenia; plan for bone marrow biopsy 1/27. PMH includes fibromyalgia, HTN, DM2, CKD, GERD, asthma.    PT Comments    Pt premedicated for anxiety prior to session; agreeable to participate. Pt demonstrating progress towards her physical therapy goals this session. Focus on bed mobility, transfer training, and initiating ambulation. Pt ambulating ~3 ft with walker and close chair follow. Demonstrates right knee instability. Will need intensive rehabilitation for strengthening, balance, and endurance.    Recommendations for follow up therapy are one component of a multi-disciplinary discharge planning process, led by the attending physician.  Recommendations may be updated based on patient status, additional functional criteria and insurance authorization.  Follow Up Recommendations  Acute inpatient rehab (3hours/day)     Assistance Recommended at Discharge Frequent or constant Supervision/Assistance  Patient can return home with the following Two people to help with walking and/or transfers;Assistance with cooking/housework;Assist for transportation;Help with stairs or ramp for entrance;A lot of help with bathing/dressing/bathroom   Equipment Recommendations  Rolling walker (2 wheels);Wheelchair (measurements  PT);Wheelchair cushion (measurements PT);Hospital bed    Recommendations for Other Services       Precautions / Restrictions Precautions Precautions: Fall;Other (comment) Precaution Comments: cortrak Restrictions Weight Bearing Restrictions: No     Mobility  Bed Mobility Overal bed mobility: Needs Assistance Bed Mobility: Supine to Sit     Supine to sit: Min assist, +2 for physical assistance     General bed mobility comments: Min A +2 to elevate trunk, pt reaching for handheld assist    Transfers Overall transfer level: Needs assistance Equipment used: Rolling walker (2 wheels) Transfers: Sit to/from Stand Sit to Stand: Min assist, +2 physical assistance, +2 safety/equipment, From elevated surface, Max assist           General transfer comment: Min A + 2 for power up from edge of bed with RW. Max A +2 for correcting posterior lean once in standing.    Ambulation/Gait Ambulation/Gait assistance: +2 physical assistance, Max assist Gait Distance (Feet): 3 Feet Assistive device: Rolling walker (2 wheels) Gait Pattern/deviations: Step-to pattern, Decreased weight shift to right, Decreased dorsiflexion - right, Knee hyperextension - right Gait velocity: decreased     General Gait Details: Max multimodal cueing for sequencing, technique, use of arms, weight shifting anteriorly, and glute activation. Pt with retropulsion and right knee hyperextension. close chair follow   Stairs             Wheelchair Mobility    Modified Rankin (Stroke Patients Only)       Balance Overall balance assessment: Needs assistance Sitting-balance support: No upper extremity supported, Feet supported Sitting balance-Leahy Scale: Fair     Standing balance support: Bilateral upper extremity supported, Reliant on assistive device for balance Standing balance-Leahy Scale: Poor  Cognition Arousal/Alertness: Awake/alert Behavior During  Therapy: WFL for tasks assessed/performed, Flat affect (More engaged. Slightly flat) Overall Cognitive Status: Impaired/Different from baseline Area of Impairment: Attention, Following commands, Safety/judgement, Awareness, Problem solving                   Current Attention Level: Selective   Following Commands: Follows one step commands with increased time Safety/Judgement: Decreased awareness of deficits Awareness: Emergent Problem Solving: Slow processing, Decreased initiation, Requires verbal cues, Difficulty sequencing, Requires tactile cues General Comments: Continues to present with decreased problem solving and requiring increased time and cues. More engaged this session. Willing to follow commands and participate in therapy. Smiling and thankful for therapy        Exercises General Exercises - Lower Extremity Long Arc Quad: Both, 5 reps, Seated    General Comments General comments (skin integrity, edema, etc.): VSS. Donning ankle orthotic on RLE at end of session      Pertinent Vitals/Pain Pain Assessment Pain Assessment: Faces Faces Pain Scale: Hurts little more Pain Location: R ankle with stretch into dorsiflexion Pain Descriptors / Indicators: Tightness, Grimacing, Guarding Pain Intervention(s): Monitored during session    Home Living                          Prior Function            PT Goals (current goals can now be found in the care plan section) Acute Rehab PT Goals Patient Stated Goal: to get stronger Potential to Achieve Goals: Fair Progress towards PT goals: Progressing toward goals    Frequency    Min 3X/week      PT Plan Frequency needs to be updated;Discharge plan needs to be updated    Co-evaluation PT/OT/SLP Co-Evaluation/Treatment: Yes Reason for Co-Treatment: For patient/therapist safety;To address functional/ADL transfers PT goals addressed during session: Mobility/safety with mobility OT goals addressed during  session: ADL's and self-care      AM-PAC PT "6 Clicks" Mobility   Outcome Measure  Help needed turning from your back to your side while in a flat bed without using bedrails?: A Little Help needed moving from lying on your back to sitting on the side of a flat bed without using bedrails?: A Little Help needed moving to and from a bed to a chair (including a wheelchair)?: A Lot Help needed standing up from a chair using your arms (e.g., wheelchair or bedside chair)?: A Lot Help needed to walk in hospital room?: Total Help needed climbing 3-5 steps with a railing? : Total 6 Click Score: 12    End of Session Equipment Utilized During Treatment: Gait belt Activity Tolerance: Patient tolerated treatment well Patient left: in chair;with call bell/phone within reach;with chair alarm set Nurse Communication: Mobility status PT Visit Diagnosis: Unsteadiness on feet (R26.81);Other abnormalities of gait and mobility (R26.89);Muscle weakness (generalized) (M62.81);Difficulty in walking, not elsewhere classified (R26.2)     Time: 0802-0827 PT Time Calculation (min) (ACUTE ONLY): 25 min  Charges:  $Gait Training: 8-22 mins                     Wyona Almas, PT, DPT Acute Rehabilitation Services Pager [phone] Office [phone]    Deno Etienne 12/03/2021, 10:10 AM

## 2021-12-03 NOTE — Progress Notes (Signed)
Inpatient Rehabilitation Admissions Coordinator   Noted PT and OT change in recommendations to CIR today. I contacted pt's daughter, [name], by phone. She is aware of therapy recommendations and request to proceed with Auth. I will begin insurance Auth with [place] for possible CIR admit. TOC team made aware. I will follow up once insurance determination made.  Danne Baxter, RN, MSN Rehab Admissions Coordinator [phone] 12/03/2021 11:25 AM

## 2021-12-03 NOTE — Progress Notes (Signed)
Hypoglycemic Event  CBG: 64  Treatment: 4 oz juice/soda  Symptoms: None  Follow-up CBG: HTMB:3112 CBG Result:74  Possible Reasons for Event: Inadequate meal intake;MD stopped the patients tube feedings this evening at 2100. The patient has barely had any intake throughout the evening.   Comments/MD notified: Gave patient 4oz of juice per order, CBG increased to 74. Patient is now also eating.   Florestine Avers

## 2021-12-03 NOTE — Progress Notes (Addendum)
Occupational Therapy Treatment Patient Details Name: [name] MRN: [id number] DOB: [birth year] Today's Date: 12/03/2021   History of present illness 71 y.o. female admitted 10/28/21 after cardiac arrest requiring 20-min CPR for ROSC; ETT in the field. Pt had 2x additional codes in ED requiring CPR for 4 and 2-min. Workup for STEMI, cardiogenic shock, large saddle PE, RUQ bleeding, 2 rib fxs. S/p PE thrombectomy 1/2. Pt with intraperitoneal hemorrhage s/p embolization 1/5. ETT 1/1-1/10. S/p L femoral non-tunneled HD catheter placed 1/4. CRRT 1/4-1/11; trialled iHD 1/12; CRRT again 1/15-1/18. Began iHD again 1/20. S/p RIJ tunneled HD catheter placed 1/24. Pt with persistent thrombocytopenia; plan for bone marrow biopsy 1/27. PMH includes fibromyalgia, HTN, DM2, CKD, GERD, asthma.   OT comments  Upon arrival, pt supine and sleeping in bed; breakfast on tray table at bedside. RN premedicated for anxiety prior to session. Pt demonstrating increased engagement and activity tolerance this session as seen by following commands, performing ADLs, and performing sit<>stand multiple times; no reports of nausea or dizziness. Pt requiring Max A +2 for short distance mobility with assistance for RW management, correcting posterior lean, and blocking R knee due to buckling. Pt performing hand hygiene and oral care at sink with Min A while seated. With pt's increased engagement (and family declining SNF), update dc recommend to CIR to increase safety and independence and reduce caregiver burden. Will continue to follow acutely as admitted.    Recommendations for follow up therapy are one component of a multi-disciplinary discharge planning process, led by the attending physician.  Recommendations may be updated based on patient status, additional functional criteria and insurance authorization.    Follow Up Recommendations  Acute inpatient rehab (3hours/day)    Assistance Recommended at Discharge Frequent or  constant Supervision/Assistance  Patient can return home with the following  Two people to help with walking and/or transfers;A lot of help with bathing/dressing/bathroom;Assistance with cooking/housework   Equipment Recommendations  Other (comment) (Defer to next venue)    Recommendations for Other Services      Precautions / Restrictions Precautions Precautions: Fall;Other (comment) Precaution Comments: cortrak       Mobility Bed Mobility Overal bed mobility: Needs Assistance Bed Mobility: Supine to Sit     Supine to sit: Min assist, +2 for physical assistance     General bed mobility comments: Min A +2 to elevate trunk    Transfers Overall transfer level: Needs assistance Equipment used: Rolling walker (2 wheels) Transfers: Sit to/from Stand Sit to Stand: Min assist, +2 physical assistance, +2 safety/equipment, From elevated surface, Max assist           General transfer comment: Min A + 2 for power up from edge of bed with RW. Max A +2 for correcting posterior lean once in standing.     Balance Overall balance assessment: Needs assistance Sitting-balance support: No upper extremity supported, Feet supported Sitting balance-Leahy Scale: Fair     Standing balance support: Bilateral upper extremity supported, Reliant on assistive device for balance Standing balance-Leahy Scale: Poor                             ADL either performed or assessed with clinical judgement   ADL Overall ADL's : Needs assistance/impaired Eating/Feeding: Set up;Supervision/ safety;Sitting Eating/Feeding Details (indicate cue type and reason): Set up for breakfast while sitting in recliner. Grooming: Minimal assistance;Sitting;Wash/dry hands Grooming Details (indicate cue type and reason): Min A for reaching soap and facilitating  weight shift forward and reach. Pt demonstrating decreased coorindation and FM skills during oral care; dropping grooming items and difficulty with  targeted reach.                 Toilet Transfer: Maximal assistance;+2 for physical assistance;Ambulation;Rolling walker (2 wheels) (simulated to recliner) Toilet Transfer Details (indicate cue type and reason): Max A +2 for decreasing posterior lean and maintaining balance. buckling of R knee in standing         Functional mobility during ADLs: Maximal assistance;+2 for physical assistance (3 ft) General ADL Comments: Pt performing sit<>Stand and then short distance forward gait with Max A +2. Pt then completing grooming tasks at sink with MIn A. demonstrating increased activity tolerance and engagement    Extremity/Trunk Assessment Upper Extremity Assessment Upper Extremity Assessment: Generalized weakness (Tremors this session (2/2 need for HD?))   Lower Extremity Assessment Lower Extremity Assessment: Defer to PT evaluation RLE Deficits / Details: hypersensitive to touch, partial ROM against gravity LLE Deficits / Details: non-tunneled HD cath in groin, partial ROM against gravity        Vision       Perception     Praxis      Cognition Arousal/Alertness: Awake/alert Behavior During Therapy: WFL for tasks assessed/performed, Flat affect (More engaged. Slightly flat) Overall Cognitive Status: Impaired/Different from baseline Area of Impairment: Attention, Following commands, Safety/judgement, Awareness, Problem solving                   Current Attention Level: Selective   Following Commands: Follows one step commands with increased time Safety/Judgement: Decreased awareness of deficits Awareness: Emergent Problem Solving: Slow processing, Decreased initiation, Requires verbal cues, Difficulty sequencing, Requires tactile cues General Comments: Continues to present with decreased problem solving and requiring increased time and cues. More engaged this session. Willing to follow commands and participate in therapy. Smiling and thankful for therapy         Exercises      Shoulder Instructions       General Comments VSS. Donning ankle orthotic on RLE at end of session    Pertinent Vitals/ Pain       Pain Assessment Pain Assessment: Faces Faces Pain Scale: Hurts little more Pain Location: R ankle with stretch into dorsiflexion Pain Descriptors / Indicators: Tightness, Grimacing, Guarding Pain Intervention(s): Monitored during session, Limited activity within patient's tolerance, Repositioned  Home Living                                          Prior Functioning/Environment              Frequency  Min 2X/week        Progress Toward Goals  OT Goals(current goals can now be found in the care plan section)  Progress towards OT goals: Progressing toward goals  Acute Rehab OT Goals OT Goal Formulation: With patient/family Time For Goal Achievement: 12/10/21 Potential to Achieve Goals: Good ADL Goals Pt Will Perform Grooming: with set-up;with supervision;sitting Pt Will Perform Upper Body Dressing: with set-up;with supervision;sitting Pt Will Transfer to Toilet: with min assist;stand pivot transfer;bedside commode Additional ADL Goal #1: Pt will tolerate sitting at EOB with Min Guard A in preparation for ADLs Additional ADL Goal #2: Pt will demonstrate increased activity tolerance to perform four grooming tasks in sitting with Supervision  Plan Discharge plan remains appropriate    Co-evaluation  PT/OT/SLP Co-Evaluation/Treatment: Yes Reason for Co-Treatment: For patient/therapist safety;To address functional/ADL transfers   OT goals addressed during session: ADL's and self-care      AM-PAC OT "6 Clicks" Daily Activity     Outcome Measure   Help from another person eating meals?: A Little Help from another person taking care of personal grooming?: A Little Help from another person toileting, which includes using toliet, bedpan, or urinal?: A Lot Help from another person bathing (including  washing, rinsing, drying)?: A Lot Help from another person to put on and taking off regular upper body clothing?: A Lot Help from another person to put on and taking off regular lower body clothing?: Total 6 Click Score: 13    End of Session Equipment Utilized During Treatment: Gait belt;Rolling walker (2 wheels)  OT Visit Diagnosis: Unsteadiness on feet (R26.81);Other abnormalities of gait and mobility (R26.89);Muscle weakness (generalized) (M62.81)   Activity Tolerance Patient limited by fatigue;Patient limited by pain   Patient Left with call bell/phone within reach;in chair;with chair alarm set   Nurse Communication Mobility status        Time: 0802-0826 OT Time Calculation (min): 24 min  Charges: OT General Charges $OT Visit: 1 Visit OT Treatments $Self Care/Home Management : 8-22 mins  [place], OTR/L Acute Rehab Pager: [phone] Office: [place] 12/03/2021, 9:19 AM

## 2021-12-03 NOTE — Progress Notes (Signed)
[facility] Progress Note     Assessment/ Plan:   AKI secondary to ATN in the setting of cardiac arrest, hypotension, pressors, contrast.  BL CKD3A with BL creatinine in the 1.1-1.3 range in late 2021.  Has been RRT dependent since 1/4 - this was discussed with the patient and the family on 1/29.  Because they noted she made some urine output plans for outpatient dialysis and permanent access had been delayed.  She last had dialysis on 2/1.  Urine output has been higher this past week but not well documented at times. - strict ins/outs re-ordered - We have started CLIP for AKI. Could consider calling ESRD soon as 1 month since starting CRRT (on 1/4) but will keep a close eye on intradialytic creatinine before doing this. If she remains dependent may need an AVF/G. - Will plan for HD on 2/7 given continued intradialytic rise in Cr.  I discussed this with her today - please choose an alternative to sucralafate given her renal failure - would reduce gabapentin to a max of 300 mg daily - lokelma once today  - bladder scan once today and in/out cath if indicated    VF cardiac arrest--> OOH VF arrest 2/2 massive PE  Massive PE s/p mechanical thrombectomy 1/2 of R and L PA.  Vasc US- no DVT, liver mass noted 10/28/21 CT abd/ pelvis, s/p MRI abd--> incompletely characterized, had MRI with gadolinium 1/20  to reevaluate -- not definitive.  Oncology following  Retroperitoneal bleed/Hemorrhagic shock/Acute blood loss anemia originating from left liver lobe s/p embolization 1/4--> required FFP, plts, Vit K, pRBCs; now stable. Hematology following - no ESA due to concern for her recent thromboembolic disease. 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 -  work up and mgmt per heme.  Overall improved  Acute hypoxic RF- extubated 11/07/21, now much improved  Sepsis/ leukocytosis/ aspiration pneumonia- MRI with aspiration PNA.  S/p  CTX and vanc    Metabolic bone disease - hyperphos resolved with HD-   PTH 129  10. Liver lesions - per charting possible biopsy once patient is more stable  11. PSA uti: present on culture. Pan sensitive. Treatment per primary   Subjective:    she had 350 mL UOP charted over 2/5 and 1.2 liters charted the day before. last HD on 2/4 with zero kg UF. (Kept even).  She feels ok. Has been through a lot  Review of systems:  Denies shortness of breath or chest pain  Denies n/v   Objective:   BP (!) 161/84 (BP Location: Left Arm)    Pulse 100    Temp 98.3 F (36.8 C) (Oral)    Resp 16    Ht 5\' 5"  (1.651 m)    Wt 74.5 kg    SpO2 93%    BMI 27.33 kg/m   Intake/Output Summary (Last 24 hours) at 12/03/2021 1413 Last data filed at 12/03/2021 1133 Gross per 24 hour  Intake 100 ml  Output 725 ml  Net -625 ml   Weight change:   Physical Exam:  URK:[id number] female in bed in NAD HEENT:  NCAT LUNGS: clear to auscultation and normal work of breathing at rest CV: S1S2 no rub ABD: soft, nontender/nt EXT: Warm and well perfused, trace edema Psych normal mood and affect Neuro awake and conversant, follows commands Gu - purewick in place Acces: RIJ tunn dialysis catheter  Imaging: DG Abd 1 View  Result Date: 12/02/2021 CLINICAL DATA:  Nausea and vomiting EXAM: ABDOMEN - 1 VIEW COMPARISON:  11/16/2021 FINDINGS: Feeding tube tip in the right upper quadrant consistent with location in the distal stomach or possibly proximal duodenum. Bowel gas pattern is normal with scattered gas and stool throughout the colon. No small or large bowel distention. No radiopaque stones. Degenerative changes in the spine. IMPRESSION: Normal nonobstructive bowel gas pattern. Electronically Signed   By: Lucienne Capers M.D.   On: 12/02/2021 20:25    Labs: BMET Recent Labs  Lab 11/27/21 0243 11/28/21 0244 11/28/21 0246 11/29/21 0548 11/30/21 0309 12/01/21 0147 12/02/21 0159 12/03/21 0713  NA 130*  --  129* 135 133* 133*  132* 134*  K 4.2  --  4.7 4.2 4.6 5.3* 4.9 5.3*  CL 94*  --  94* 99 97* 97* 95* 95*  CO2 26  --  24 27 25 24 27 27   GLUCOSE 140*  --  170* 163* 99 182* 290* 98  BUN 28*  --  45* 26* 39* 56* 29* 45*  CREATININE 3.25*  --  4.66* 2.95* 4.04* 5.02* 2.96* 4.45*  CALCIUM 8.3*  --  8.5* 8.4* 8.6* 8.7* 8.2* 8.8*  PHOS 3.5 4.0  --  2.7 2.8 3.0 3.2 4.3   CBC Recent Labs  Lab 11/30/21 0309 12/01/21 0147 12/02/21 0159 12/03/21 0713  WBC 8.3 9.2 8.2 9.7  NEUTROABS  --   --   --  7.1  HGB 7.4* 7.2* 7.4* 7.3*  HCT 22.3* 22.7* 22.9* 23.0*  MCV 89.6 90.4 90.2 90.2  PLT 77* 87* 61* 78*    Medications:     apixaban  5 mg Oral BID   atorvastatin  20 mg Oral Daily   B-complex with vitamin C  1 tablet Oral Daily   chlorhexidine  15 mL Mouth Rinse BID   Chlorhexidine Gluconate Cloth  6 each Topical Daily   Chlorhexidine Gluconate Cloth  6 each Topical Q0600   dicyclomine  10 mg Oral TID AC   DULoxetine  20 mg Oral Daily   feeding supplement (NEPRO CARB STEADY)  237 mL Oral TID BM   folic acid  2 mg Oral Daily   gabapentin  300 mg Oral BID   insulin aspart  0-9 Units Subcutaneous Q4H   insulin detemir  4 Units Subcutaneous BID   mouth rinse  15 mL Mouth Rinse q12n4p   pantoprazole  40 mg Oral BID   sucralfate  1 g Oral TID    Claudia Desanctis, MD 2:35 PM 12/03/2021

## 2021-12-03 NOTE — Progress Notes (Signed)
Nutrition Follow-up  DOCUMENTATION CODES:   Severe malnutrition in context of acute illness/injury  INTERVENTION:   Continue Nepro Shake po TID, each supplement provides 425 kcal and 19 grams protein.  Add Vital Cuisine Shake TID with meals, each supplement provides 520 kcal and 22 grams of protein.  Add Mighty Shake TID with meals, each supplement provides 480-500 kcals and 20-23 grams of protein.  Encourage intake of meals and supplements.   Continue to hold nocturnal TF.  Continue calorie count x 24 hours.  NUTRITION DIAGNOSIS:   Severe Malnutrition related to acute illness as evidenced by moderate muscle depletion, energy intake < or equal to 50% for > or equal to 5 days, mild fat depletion.  Ongoing   GOAL:   Patient will meet greater than or equal to 90% of their needs  Progressing  MONITOR:   Diet advancement, Labs, Weight trends, Other (Comment)  REASON FOR ASSESSMENT:   Consult Calorie Count  ASSESSMENT:   71 yo female admitted with acute encephalopathy post OOH prolonged V.fib arrest with multiorgan failure with massive saddle PE and DIC, AKI. PMH includes DM, HTN, HLD, GERD, anxiety, depression, fibromyalgia  Diet was advanced to dysphagia 3 with thin liquids on 1/31. Calorie count was ordered on Friday, continued over the weekend. Five meals were documented.  Calorie count: 2/4 Lunch: 72 kcal, 6 gm protein 2/4 Dinner: 147 kcal, 12 gm protein Total: 219 kcal, 18 gm protein  2/5 Breakfast: 60 kcal, 2 gm protein 2/5 Lunch: 50 kcal, 3 gm protein 2/5 Dinner: 70 kcal, 6 gm protein Total: 180 kcal, 11 gm protein  Average daily intake is meeting 10% of minimum estimated calorie needs and 15% of minimum estimated protein needs.   Nocturnal TF has been discontinued, but Cortrak tube remains in place.   Labs reviewed. Na 134, K 5.3 CBG: 94-145  Medications reviewed and include B-complex with vitamin C, folic acid, Novolog, Levemir, Protonix,  Carafate.  Admission weight 77.1 kg Current weight 74.5 kg (1/28)  Last HD 2/4.  Diet Order:   Diet Order             DIET DYS 3 Room service appropriate? Yes with Assist; Fluid consistency: Thin  Diet effective now                   EDUCATION NEEDS:   Not appropriate for education at this time  Skin:  Skin Assessment: Skin Integrity Issues: Skin Integrity Issues:: Stage II, Other (Comment) Stage II: Left sternum Other: MASD to buttocks; skin tear to buttocks and R elbow  Last BM:  2/4  Height:   Ht Readings from Last 1 Encounters:  10/28/21 5\' 5"  (1.651 m)    Weight:   Wt Readings from Last 1 Encounters:  12/01/21 74.5 kg    BMI:  Body mass index is 27.33 kg/m.  Estimated Nutritional Needs:   Kcal:  1950-2150 kcals  Protein:  100-125 g  Fluid:  >/= 1.8 L    Lucas Mallow RD, LDN, CNSC Please refer to Amion for contact information.

## 2021-12-03 NOTE — Progress Notes (Signed)
Inpatient Diabetes Program Recommendations  AACE/ADA: New Consensus Statement on Inpatient Glycemic Control (2015)  Target Ranges:  Prepandial:   less than 140 mg/dL      Peak postprandial:   less than 180 mg/dL (1-2 hours)      Critically ill patients:  140 - 180 mg/dL   Lab Results  Component Value Date   GLUCAP 94 12/03/2021   HGBA1C 9.0 (H) 10/28/2021    Review of Glycemic Control  Latest Reference Range & Units 12/02/21 08:50 12/02/21 11:53 12/02/21 16:54 12/02/21 20:07 12/03/21 00:03 12/03/21 04:14 12/03/21 04:39 12/03/21 08:28  Glucose-Capillary 70 - 99 mg/dL 124 (H) 103 (H) 101 (H) 164 (H) 111 (H) 65 (L) 74 94  (H): Data is abnormally high (L): Data is abnormally low  Diabetes history: DM2 Outpatient Diabetes medications: Lantus 22 QHS + Prandin 2 mg BID  Current orders for Inpatient glycemic control: Levemir 8 units bid, Novolog 4 units q 4 hrs., Novolog correction 0-9 units q 4 hrs.  Inpatient Diabetes Program Recommendations:   Noted hypoglycemia and patient is now eating. If continues feeding on hold: -Decrease Levemir to 4 units bid -D/C Novolog meal coverage -Change Novolog correction to 0-9 units tid Secure chat to Dr. Roderic Palau.  Thank you, Nani Gasser. Jaire Pinkham, RN, MSN, CDE  Diabetes Coordinator Inpatient Glycemic Control Team Team Pager [phone] (8am-5pm) 12/03/2021 11:25 AM

## 2021-12-04 ENCOUNTER — Encounter (HOSPITAL_COMMUNITY): Payer: Self-pay | Admitting: Hematology & Oncology

## 2021-12-04 ENCOUNTER — Other Ambulatory Visit: Payer: Self-pay

## 2021-12-04 ENCOUNTER — Inpatient Hospital Stay (HOSPITAL_COMMUNITY)
Admission: RE | Admit: 2021-12-04 | Discharge: 2022-01-07 | DRG: 945 | Disposition: A | Discharge status: Home Health | Payer: Medicare PPO | Source: Intra-hospital | Attending: Physical Medicine and Rehabilitation | Admitting: Physical Medicine and Rehabilitation

## 2021-12-04 ENCOUNTER — Encounter (HOSPITAL_COMMUNITY): Payer: Self-pay | Admitting: Physical Medicine and Rehabilitation

## 2021-12-04 ENCOUNTER — Ambulatory Visit: Payer: Medicare Other | Admitting: Physician Assistant

## 2021-12-04 DIAGNOSIS — Z86711 Personal history of pulmonary embolism: Secondary | ICD-10-CM

## 2021-12-04 DIAGNOSIS — L89810 Pressure ulcer of head, unstageable: Secondary | ICD-10-CM | POA: Diagnosis present

## 2021-12-04 DIAGNOSIS — D62 Acute posthemorrhagic anemia: Secondary | ICD-10-CM | POA: Diagnosis present

## 2021-12-04 DIAGNOSIS — K5900 Constipation, unspecified: Secondary | ICD-10-CM

## 2021-12-04 DIAGNOSIS — Z4682 Encounter for fitting and adjustment of non-vascular catheter: Secondary | ICD-10-CM | POA: Diagnosis not present

## 2021-12-04 DIAGNOSIS — Z66 Do not resuscitate: Secondary | ICD-10-CM | POA: Diagnosis not present

## 2021-12-04 DIAGNOSIS — D72829 Elevated white blood cell count, unspecified: Secondary | ICD-10-CM | POA: Diagnosis not present

## 2021-12-04 DIAGNOSIS — I2602 Saddle embolus of pulmonary artery with acute cor pulmonale: Secondary | ICD-10-CM

## 2021-12-04 DIAGNOSIS — E1122 Type 2 diabetes mellitus with diabetic chronic kidney disease: Secondary | ICD-10-CM | POA: Diagnosis not present

## 2021-12-04 DIAGNOSIS — D649 Anemia, unspecified: Secondary | ICD-10-CM | POA: Diagnosis not present

## 2021-12-04 DIAGNOSIS — J9600 Acute respiratory failure, unspecified whether with hypoxia or hypercapnia: Secondary | ICD-10-CM | POA: Diagnosis not present

## 2021-12-04 DIAGNOSIS — N390 Urinary tract infection, site not specified: Secondary | ICD-10-CM | POA: Diagnosis not present

## 2021-12-04 DIAGNOSIS — E119 Type 2 diabetes mellitus without complications: Secondary | ICD-10-CM | POA: Diagnosis not present

## 2021-12-04 DIAGNOSIS — I129 Hypertensive chronic kidney disease with stage 1 through stage 4 chronic kidney disease, or unspecified chronic kidney disease: Secondary | ICD-10-CM | POA: Diagnosis present

## 2021-12-04 DIAGNOSIS — Z794 Long term (current) use of insulin: Secondary | ICD-10-CM

## 2021-12-04 DIAGNOSIS — R5381 Other malaise: Principal | ICD-10-CM | POA: Diagnosis present

## 2021-12-04 DIAGNOSIS — F419 Anxiety disorder, unspecified: Secondary | ICD-10-CM | POA: Diagnosis present

## 2021-12-04 DIAGNOSIS — N170 Acute kidney failure with tubular necrosis: Secondary | ICD-10-CM | POA: Diagnosis not present

## 2021-12-04 DIAGNOSIS — N179 Acute kidney failure, unspecified: Secondary | ICD-10-CM | POA: Diagnosis not present

## 2021-12-04 DIAGNOSIS — M797 Fibromyalgia: Secondary | ICD-10-CM | POA: Diagnosis present

## 2021-12-04 DIAGNOSIS — Z808 Family history of malignant neoplasm of other organs or systems: Secondary | ICD-10-CM

## 2021-12-04 DIAGNOSIS — N1831 Chronic kidney disease, stage 3a: Secondary | ICD-10-CM | POA: Diagnosis not present

## 2021-12-04 DIAGNOSIS — I10 Essential (primary) hypertension: Secondary | ICD-10-CM | POA: Diagnosis not present

## 2021-12-04 DIAGNOSIS — R7881 Bacteremia: Secondary | ICD-10-CM | POA: Diagnosis present

## 2021-12-04 DIAGNOSIS — K661 Hemoperitoneum: Secondary | ICD-10-CM | POA: Diagnosis not present

## 2021-12-04 DIAGNOSIS — Z886 Allergy status to analgesic agent status: Secondary | ICD-10-CM | POA: Diagnosis not present

## 2021-12-04 DIAGNOSIS — J45909 Unspecified asthma, uncomplicated: Secondary | ICD-10-CM | POA: Diagnosis present

## 2021-12-04 DIAGNOSIS — E1165 Type 2 diabetes mellitus with hyperglycemia: Secondary | ICD-10-CM | POA: Diagnosis present

## 2021-12-04 DIAGNOSIS — K219 Gastro-esophageal reflux disease without esophagitis: Secondary | ICD-10-CM | POA: Diagnosis present

## 2021-12-04 DIAGNOSIS — Z833 Family history of diabetes mellitus: Secondary | ICD-10-CM

## 2021-12-04 DIAGNOSIS — N2581 Secondary hyperparathyroidism of renal origin: Secondary | ICD-10-CM | POA: Diagnosis not present

## 2021-12-04 DIAGNOSIS — F05 Delirium due to known physiological condition: Secondary | ICD-10-CM | POA: Diagnosis not present

## 2021-12-04 DIAGNOSIS — Z87891 Personal history of nicotine dependence: Secondary | ICD-10-CM

## 2021-12-04 DIAGNOSIS — G931 Anoxic brain damage, not elsewhere classified: Secondary | ICD-10-CM | POA: Diagnosis present

## 2021-12-04 DIAGNOSIS — Z8674 Personal history of sudden cardiac arrest: Secondary | ICD-10-CM | POA: Diagnosis not present

## 2021-12-04 DIAGNOSIS — B961 Klebsiella pneumoniae [K. pneumoniae] as the cause of diseases classified elsewhere: Secondary | ICD-10-CM | POA: Diagnosis not present

## 2021-12-04 DIAGNOSIS — Z79899 Other long term (current) drug therapy: Secondary | ICD-10-CM

## 2021-12-04 DIAGNOSIS — Z8249 Family history of ischemic heart disease and other diseases of the circulatory system: Secondary | ICD-10-CM

## 2021-12-04 DIAGNOSIS — Z992 Dependence on renal dialysis: Secondary | ICD-10-CM | POA: Diagnosis not present

## 2021-12-04 DIAGNOSIS — E785 Hyperlipidemia, unspecified: Secondary | ICD-10-CM | POA: Diagnosis present

## 2021-12-04 DIAGNOSIS — Z4659 Encounter for fitting and adjustment of other gastrointestinal appliance and device: Secondary | ICD-10-CM

## 2021-12-04 DIAGNOSIS — N189 Chronic kidney disease, unspecified: Secondary | ICD-10-CM | POA: Diagnosis present

## 2021-12-04 DIAGNOSIS — D696 Thrombocytopenia, unspecified: Secondary | ICD-10-CM | POA: Diagnosis not present

## 2021-12-04 DIAGNOSIS — R4182 Altered mental status, unspecified: Secondary | ICD-10-CM | POA: Diagnosis not present

## 2021-12-04 DIAGNOSIS — B952 Enterococcus as the cause of diseases classified elsewhere: Secondary | ICD-10-CM | POA: Diagnosis present

## 2021-12-04 DIAGNOSIS — R410 Disorientation, unspecified: Secondary | ICD-10-CM | POA: Diagnosis not present

## 2021-12-04 DIAGNOSIS — I469 Cardiac arrest, cause unspecified: Secondary | ICD-10-CM | POA: Diagnosis present

## 2021-12-04 DIAGNOSIS — Z888 Allergy status to other drugs, medicaments and biological substances status: Secondary | ICD-10-CM | POA: Diagnosis not present

## 2021-12-04 DIAGNOSIS — Z882 Allergy status to sulfonamides status: Secondary | ICD-10-CM | POA: Diagnosis not present

## 2021-12-04 DIAGNOSIS — E559 Vitamin D deficiency, unspecified: Secondary | ICD-10-CM | POA: Diagnosis present

## 2021-12-04 DIAGNOSIS — E1129 Type 2 diabetes mellitus with other diabetic kidney complication: Secondary | ICD-10-CM | POA: Diagnosis not present

## 2021-12-04 DIAGNOSIS — K5730 Diverticulosis of large intestine without perforation or abscess without bleeding: Secondary | ICD-10-CM | POA: Diagnosis not present

## 2021-12-04 DIAGNOSIS — R160 Hepatomegaly, not elsewhere classified: Secondary | ICD-10-CM | POA: Diagnosis present

## 2021-12-04 DIAGNOSIS — I959 Hypotension, unspecified: Secondary | ICD-10-CM | POA: Diagnosis not present

## 2021-12-04 DIAGNOSIS — Z4901 Encounter for fitting and adjustment of extracorporeal dialysis catheter: Secondary | ICD-10-CM | POA: Diagnosis not present

## 2021-12-04 DIAGNOSIS — E875 Hyperkalemia: Secondary | ICD-10-CM | POA: Diagnosis not present

## 2021-12-04 LAB — COMPREHENSIVE METABOLIC PANEL
ALT: 44 U/L (ref 0–44)
AST: 34 U/L (ref 15–41)
Albumin: 2.8 g/dL — ABNORMAL LOW (ref 3.5–5.0)
Alkaline Phosphatase: 152 U/L — ABNORMAL HIGH (ref 38–126)
Anion gap: 9 (ref 5–15)
BUN: 53 mg/dL — ABNORMAL HIGH (ref 8–23)
CO2: 28 mmol/L (ref 22–32)
Calcium: 8.8 mg/dL — ABNORMAL LOW (ref 8.9–10.3)
Chloride: 97 mmol/L — ABNORMAL LOW (ref 98–111)
Creatinine, Ser: 4.97 mg/dL — ABNORMAL HIGH (ref 0.44–1.00)
GFR, Estimated: 9 mL/min — ABNORMAL LOW (ref >60)
Glucose, Bld: 129 mg/dL — ABNORMAL HIGH (ref 70–99)
Potassium: 4.6 mmol/L (ref 3.5–5.1)
Sodium: 134 mmol/L — ABNORMAL LOW (ref 135–145)
Total Bilirubin: 0.6 mg/dL (ref 0.3–1.2)
Total Protein: 6.5 g/dL (ref 6.5–8.1)

## 2021-12-04 LAB — CBC WITH DIFFERENTIAL/PLATELET
Abs Immature Granulocytes: 0.04 10*3/uL (ref 0.00–0.07)
Basophils Absolute: 0.1 10*3/uL (ref 0.0–0.1)
Basophils Relative: 1 %
Eosinophils Absolute: 0.2 10*3/uL (ref 0.0–0.5)
Eosinophils Relative: 2 %
HCT: 23.6 % — ABNORMAL LOW (ref 36.0–46.0)
Hemoglobin: 7.8 g/dL — ABNORMAL LOW (ref 12.0–15.0)
Immature Granulocytes: 0 %
Lymphocytes Relative: 15 %
Lymphs Abs: 1.4 10*3/uL (ref 0.7–4.0)
MCH: 29.3 pg (ref 26.0–34.0)
MCHC: 33.1 g/dL (ref 30.0–36.0)
MCV: 88.7 fL (ref 80.0–100.0)
Monocytes Absolute: 1 10*3/uL (ref 0.1–1.0)
Monocytes Relative: 11 %
Neutro Abs: 6.7 10*3/uL (ref 1.7–7.7)
Neutrophils Relative %: 71 %
Platelets: 91 10*3/uL — ABNORMAL LOW (ref 150–400)
RBC: 2.66 MIL/uL — ABNORMAL LOW (ref 3.87–5.11)
RDW: 15.8 % — ABNORMAL HIGH (ref 11.5–15.5)
WBC: 9.4 10*3/uL (ref 4.0–10.5)
nRBC: 0 % (ref 0.0–0.2)

## 2021-12-04 LAB — GLUCOSE, CAPILLARY
Glucose-Capillary: 101 mg/dL — ABNORMAL HIGH (ref 70–99)
Glucose-Capillary: 119 mg/dL — ABNORMAL HIGH (ref 70–99)
Glucose-Capillary: 162 mg/dL — ABNORMAL HIGH (ref 70–99)
Glucose-Capillary: 72 mg/dL (ref 70–99)
Glucose-Capillary: 83 mg/dL (ref 70–99)
Glucose-Capillary: 85 mg/dL (ref 70–99)
Glucose-Capillary: 93 mg/dL (ref 70–99)

## 2021-12-04 LAB — MAGNESIUM: Magnesium: 2 mg/dL (ref 1.7–2.4)

## 2021-12-04 MED ORDER — GABAPENTIN 100 MG PO CAPS: 100.0000 mg | ORAL_CAPSULE | Freq: Two times a day (BID) | ORAL | Status: DC

## 2021-12-04 MED ORDER — WHITE PETROLATUM EX OINT: TOPICAL_OINTMENT | CUTANEOUS | Status: DC | PRN

## 2021-12-04 MED ORDER — INSULIN ASPART 100 UNIT/ML IJ SOLN: 0.0000 [IU] | INTRAMUSCULAR | Status: DC

## 2021-12-04 MED ORDER — APIXABAN 5 MG PO TABS
5.0000 mg | ORAL_TABLET | Freq: Two times a day (BID) | ORAL | Status: DC
Start: 2021-12-04 — End: 2021-12-18
  Administered 2021-12-04 – 2021-12-18 (×28): 5 mg via ORAL
  Filled 2021-12-04 (×28): qty 1

## 2021-12-04 MED ORDER — CHLORHEXIDINE GLUCONATE CLOTH 2 % EX PADS: 6.0000 | MEDICATED_PAD | Freq: Every day | CUTANEOUS | Status: DC

## 2021-12-04 MED ORDER — TEMAZEPAM 15 MG PO CAPS
15.0000 mg | ORAL_CAPSULE | Freq: Every evening | ORAL | Status: DC | PRN
  Administered 2021-12-14 – 2022-01-06 (×19): 15 mg via ORAL
  Filled 2021-12-04 (×19): qty 1

## 2021-12-04 MED ORDER — IPRATROPIUM-ALBUTEROL 0.5-2.5 (3) MG/3ML IN SOLN: 3.0000 mL | RESPIRATORY_TRACT | Status: DC | PRN

## 2021-12-04 MED ORDER — LIDOCAINE VISCOUS HCL 2 % MT SOLN
15.0000 mL | Freq: Four times a day (QID) | OROMUCOSAL | Status: DC | PRN
  Filled 2021-12-04: qty 15

## 2021-12-04 MED ORDER — HEPARIN SODIUM (PORCINE) 1000 UNIT/ML DIALYSIS: 1000.0000 [IU] | INTRAMUSCULAR | Status: DC | PRN

## 2021-12-04 MED ORDER — CHLORHEXIDINE GLUCONATE 0.12 % MT SOLN
15.0000 mL | Freq: Two times a day (BID) | OROMUCOSAL | Status: DC
  Administered 2021-12-04 – 2022-01-07 (×58): 15 mL via OROMUCOSAL
  Filled 2021-12-04 (×61): qty 15

## 2021-12-04 MED ORDER — INSULIN ASPART 100 UNIT/ML IJ SOLN: 0.0000 [IU] | INTRAMUSCULAR | 11 refills | Status: DC

## 2021-12-04 MED ORDER — INSULIN DETEMIR 100 UNIT/ML ~~LOC~~ SOLN: 4.0000 [IU] | Freq: Two times a day (BID) | SUBCUTANEOUS | 11 refills | Status: DC

## 2021-12-04 MED ORDER — OXYCODONE HCL 5 MG PO TABS
5.0000 mg | ORAL_TABLET | ORAL | Status: DC | PRN
  Administered 2021-12-07 – 2022-01-04 (×24): 5 mg via ORAL
  Filled 2021-12-04 (×26): qty 1

## 2021-12-04 MED ORDER — DULOXETINE HCL 20 MG PO CPEP
20.0000 mg | ORAL_CAPSULE | Freq: Every day | ORAL | Status: DC
  Administered 2021-12-05 – 2021-12-18 (×14): 20 mg via ORAL
  Filled 2021-12-04 (×14): qty 1

## 2021-12-04 MED ORDER — GABAPENTIN 100 MG PO CAPS
100.0000 mg | ORAL_CAPSULE | Freq: Two times a day (BID) | ORAL | Status: DC
  Administered 2021-12-04 – 2021-12-06 (×4): 100 mg via ORAL
  Filled 2021-12-04 (×4): qty 1

## 2021-12-04 MED ORDER — CAMPHOR-MENTHOL 0.5-0.5 % EX LOTN
TOPICAL_LOTION | CUTANEOUS | Status: DC | PRN
  Filled 2021-12-04: qty 222

## 2021-12-04 MED ORDER — ACETAMINOPHEN 160 MG/5ML PO SOLN
650.0000 mg | ORAL | Status: DC | PRN
  Administered 2021-12-16: 650 mg
  Filled 2021-12-04: qty 20.3

## 2021-12-04 MED ORDER — SODIUM CHLORIDE 0.9 % IV SOLN: INTRAVENOUS | Status: DC

## 2021-12-04 MED ORDER — B COMPLEX-C PO TABS
1.0000 | ORAL_TABLET | Freq: Every day | ORAL | Status: DC
  Administered 2021-12-05 – 2022-01-07 (×34): 1 via ORAL
  Filled 2021-12-04 (×34): qty 1

## 2021-12-04 MED ORDER — FOLIC ACID 1 MG PO TABS
2.0000 mg | ORAL_TABLET | Freq: Every day | ORAL | Status: DC
  Administered 2021-12-05 – 2022-01-07 (×34): 2 mg via ORAL
  Filled 2021-12-04 (×34): qty 2

## 2021-12-04 MED ORDER — APIXABAN 5 MG PO TABS: 5.0000 mg | ORAL_TABLET | Freq: Two times a day (BID) | ORAL | Status: DC

## 2021-12-04 MED ORDER — INSULIN DETEMIR 100 UNIT/ML ~~LOC~~ SOLN
4.0000 [IU] | Freq: Two times a day (BID) | SUBCUTANEOUS | Status: DC
  Administered 2021-12-04 – 2021-12-06 (×3): 4 [IU] via SUBCUTANEOUS
  Filled 2021-12-04 (×6): qty 0.04

## 2021-12-04 MED ORDER — ACETAMINOPHEN 325 MG PO TABS
650.0000 mg | ORAL_TABLET | ORAL | Status: DC | PRN
  Administered 2021-12-06 – 2022-01-04 (×8): 650 mg via ORAL
  Filled 2021-12-04 (×11): qty 2

## 2021-12-04 MED ORDER — DICYCLOMINE HCL 10 MG PO CAPS: 10.0000 mg | ORAL_CAPSULE | Freq: Three times a day (TID) | ORAL | Status: DC

## 2021-12-04 MED ORDER — ACETAMINOPHEN 650 MG RE SUPP: 650.0000 mg | RECTAL | Status: DC | PRN

## 2021-12-04 MED ORDER — DULOXETINE HCL 20 MG PO CPEP: 20.0000 mg | ORAL_CAPSULE | Freq: Every day | ORAL | 3 refills | Status: DC

## 2021-12-04 MED ORDER — ATORVASTATIN CALCIUM 20 MG PO TABS: 20.0000 mg | ORAL_TABLET | Freq: Every day | ORAL | Status: DC

## 2021-12-04 MED ORDER — NEPRO/CARBSTEADY PO LIQD
237.0000 mL | Freq: Three times a day (TID) | ORAL | Status: DC
  Administered 2021-12-04 – 2022-01-07 (×68): 237 mL via ORAL

## 2021-12-04 MED ORDER — DOCUSATE SODIUM 100 MG PO CAPS
100.0000 mg | ORAL_CAPSULE | Freq: Two times a day (BID) | ORAL | Status: DC | PRN
  Administered 2021-12-10 – 2021-12-12 (×2): 100 mg via ORAL
  Filled 2021-12-04 (×2): qty 1

## 2021-12-04 MED ORDER — ATORVASTATIN CALCIUM 10 MG PO TABS
20.0000 mg | ORAL_TABLET | Freq: Every day | ORAL | Status: DC
Start: 2021-12-05 — End: 2022-01-07
  Administered 2021-12-05 – 2022-01-07 (×34): 20 mg via ORAL
  Filled 2021-12-04 (×36): qty 2

## 2021-12-04 MED ORDER — DICYCLOMINE HCL 10 MG PO CAPS
10.0000 mg | ORAL_CAPSULE | Freq: Three times a day (TID) | ORAL | Status: DC
  Administered 2021-12-05 – 2021-12-11 (×14): 10 mg via ORAL
  Filled 2021-12-04 (×21): qty 1

## 2021-12-04 MED ORDER — GERHARDT'S BUTT CREAM: TOPICAL_CREAM | CUTANEOUS | Status: DC | PRN

## 2021-12-04 MED ORDER — OXYCODONE HCL 5 MG PO TABS: 5.0000 mg | ORAL_TABLET | ORAL | 0 refills | Status: DC | PRN

## 2021-12-04 MED ORDER — PANTOPRAZOLE SODIUM 40 MG PO TBEC
40.0000 mg | DELAYED_RELEASE_TABLET | Freq: Two times a day (BID) | ORAL | Status: DC
  Administered 2021-12-04 – 2022-01-07 (×66): 40 mg via ORAL
  Filled 2021-12-04 (×67): qty 1

## 2021-12-04 MED ORDER — SODIUM CHLORIDE 0.9 % IV SOLN
INTRAVENOUS | Status: AC
  Administered 2021-12-04: 75 mL/h via INTRAVENOUS

## 2021-12-04 MED ORDER — FOLIC ACID 1 MG PO TABS: 2.0000 mg | ORAL_TABLET | Freq: Every day | ORAL | Status: DC

## 2021-12-04 MED ORDER — MUPIROCIN CALCIUM 2 % EX CREA
1.0000 "application " | TOPICAL_CREAM | Freq: Two times a day (BID) | CUTANEOUS | Status: DC
  Administered 2021-12-04 – 2022-01-07 (×63): 1 via TOPICAL
  Filled 2021-12-04: qty 15

## 2021-12-04 MED ORDER — ALPRAZOLAM 0.25 MG PO TABS
0.2500 mg | ORAL_TABLET | Freq: Every day | ORAL | Status: DC | PRN
  Administered 2021-12-05 – 2022-01-04 (×15): 0.25 mg via ORAL
  Filled 2021-12-04 (×15): qty 1

## 2021-12-04 NOTE — TOC Transition Note (Addendum)
Transition of Care (TOC) - CM/SW Discharge Note Marvetta Gibbons RN, BSN Transitions of Care Unit 4E- RN Case Manager See Treatment Team for direct phone #    Patient Details  Name: [name] MRN: [id number] Date of Birth: [birth year]  Transition of Care [facility]) CM/SW Contact:  Dawayne Patricia, RN Phone Number: 12/04/2021, 4:26 PM   Clinical Narrative:    Have been notified by Pamala Hurry with [place]- pt's insurance has approved INPT rehab stay. Per Pamala Hurry pt has bed available today if MD medically clears patient for transition to [place] rehab.  30- MD has cleared pt to transition to [place] re[facility] today, and bed has been confirmed for transition to rehab later this afternoon. Pt and daughter's agreeable and accept bed offer for INPT rehab.   Pt to transition to [facility] INPT rehab today, [facility] to remain on transfer.    Final next level of care: IP Rehab Facility Barriers to Discharge: Barriers Resolved   Patient Goals and CMS Choice Patient states their goals for this hospitalization and ongoing recovery are:: rehab and return home CMS Medicare.gov Compare Post Acute Care list provided to:: Patient (pt and daughters) Choice offered to / list presented to : Patient, Adult Children  Discharge Placement               [facility] INPT rehab        Discharge Plan and Services In-house Referral: Clinical Social Work Discharge Planning Services: CM Consult Post Acute Care Choice: Home Health          DME Arranged: N/A DME Agency: NA       HH Arranged: NA [place] Agency: NA        Social Determinants of Health (SDOH) Interventions     Readmission Risk Interventions Readmission Risk Prevention Plan 12/04/2021 11/02/2021  Transportation Screening Complete Complete  PCP or Specialist Appt within 3-5 Days Not Complete -  Not Complete comments INPT rehab -  [place] or Home Care Consult Complete -  Social Work Consult for [place] Planning/Counseling Complete -  Palliative  Care Screening Complete -  Medication Review Press photographer) Complete -  Some recent data might be hidden

## 2021-12-04 NOTE — Plan of Care (Signed)

## 2021-12-04 NOTE — Discharge Summary (Signed)
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 Persistent left lower lobe airspace opacification, possibly due to pneumonia. Associated small left pleural effusion. Electronically Signed   By: Lorin Picket M.D.   On: 11/12/2021 08:47   DG CHEST PORT 1 VIEW  Result Date: 11/10/2021 CLINICAL DATA:  Hypoxia EXAM: PORTABLE CHEST 1 VIEW COMPARISON:  11/01/2021 FINDINGS: Single frontal view of the chest demonstrates interval removal of the endotracheal and enteric catheters. Right internal jugular catheter unchanged. Cardiac silhouette is stable. Chronic elevation of the right hemidiaphragm. Improved aeration at the lung bases, with minimal residual atelectasis. Mild central vascular congestion. No effusion or pneumothorax. IMPRESSION: 1. Low lung volumes, with improved aeration at the lung bases. 2. Central vascular congestion without overt edema. Electronically Signed   By: Randa Ngo M.D.   On: 11/10/2021 22:09   DG Ankle Right Port  Result Date: 11/19/2021 CLINICAL DATA:  Trauma, pain EXAM: PORTABLE RIGHT ANKLE - 2 VIEW COMPARISON:   None. FINDINGS: No recent fracture or dislocation is seen. Small bony spurs are seen at the tip medial malleolus. Plantar spur is seen in calcaneus. Small bony spurs seen in the dorsal aspect of intertarsal joints. IMPRESSION: No recent fracture or dislocation is seen in the right ankle. There is moderate sized plantar spur in calcaneus. There are bony spurs at the tip of medial malleolus and dorsal aspect of intertarsal joints, suggesting degenerative arthritis. Electronically Signed   By: Elmer Picker M.D.   On: 11/19/2021 14:48   DG Abd Portable 1V  Result Date: 11/16/2021 CLINICAL DATA:  Feeding tube placement. EXAM: PORTABLE ABDOMEN - 1 VIEW COMPARISON:  Abdominal x-ray 10/28/2021. FINDINGS: Enteric tube tip is at the level of the distal stomach. No dilated bowel loops are seen. Hyperdensity is seen within diverticula in the right colon. IMPRESSION: 1. Enteric tube tip at the level of the distal stomach. Electronically Signed   By: Ronney Asters M.D.   On: 11/16/2021 16:30   DG Swallowing Func-Speech Pathology  Result Date: 11/14/2021 Table formatting from the original result was not included. Objective Swallowing Evaluation: Type of Study: MBS-Modified Barium Swallow Study  Patient Details Name: [name] MRN: [id number] Date of Birth: Aug 07, 1951 Today's Date: 11/14/2021 Time: SLP Start Time (ACUTE ONLY): 0850 -SLP Stop Time (ACUTE ONLY): 0910 SLP Time Calculation (min) (ACUTE ONLY): 20 min Past Medical History: Past  Medical History: Diagnosis Date  Anxiety   Arthritis   Asthma   Cardiac arrest ([place]) 10/28/2021  Cardiac arrest ([place]) 10/28/2021  Depression   Diabetes mellitus without complication (HCC)   Fibromyalgia   GERD (gastroesophageal reflux disease)   Hyperlipemia   Hypertension   PONV (postoperative nausea and vomiting)  Past Surgical History: Past Surgical History: Procedure Laterality Date  COLONOSCOPY    DILATION AND CURETTAGE OF UTERUS    IR EMBO ART  VEN HEMORR LYMPH EXTRAV  INC  GUIDE ROADMAPPING  11/01/2021  IR THROMBECT PRIM MECH INIT (INCLU) MOD SED  10/29/2021  IR US GUIDE VASC ACCESS RIGHT  10/29/2021  IR US GUIDE VASC ACCESS RIGHT  11/01/2021  KNEE ARTHROSCOPY  2003,2005  left  SHOULDER ARTHROSCOPY    left  TRIGGER FINGER RELEASE  08/13/2012  Procedure: RELEASE TRIGGER FINGER/A-1 PULLEY;  Surgeon: [name]., MD;  Location: [facility];  Service: Orthopedics;  Laterality: Right;  long finger  TUBAL LIGATION   HPI: Pt is a 71 yo female presenting 1/1 after prolonged OOH VF arrest (20 min of CPR, also coded 2x in ED, 4 min and 3 min) secondary to massive PE complicated by DIC. She is s/p thrombectomy on 1/2. Hospital course further complicated by AKI requiring CRRT, hemorrhagic shock due to hepatic bleeding, vomiting wtih TF. PMH includes DM, HTN, HLD, GERD, anxiety, depression, fibromyalgia  Subjective: eager for swallow study  Recommendations for follow up therapy are one component of a multi-disciplinary discharge planning process, led by the attending physician.  Recommendations may be updated based on patient status, additional functional criteria and insurance authorization. Assessment / Plan / Recommendation Clinical Impressions 11/14/2021 Clinical Impression Pt has a mild oropharyngeal dysphagia, improving after first few boluses after giving her a little time to warm up. She had penetration with nectar thick liquids via spoon initially, but progressed up to thin liquids with a single instance of trace penetration that was shallow and readily cleared with a cued throat clear. Lingual propulsion is a little weak and she has a tendency to spill boluses into her valleculae before she swallows, but no aspiration is observed. She did not want to try much in terms of foods, even purees. She did not consume anything more solid than puree, referencing a burning sensation on her tongue. She would rather start with liquids and this may be a good starting point given her  current level of endurance as well. Will start with full liquid diet, ensuring upright positioning as much as possible during PO intake and still maintaining frequent oral care. SLP Visit Diagnosis Dysphagia, unspecified (R13.10) Attention and concentration deficit following -- Frontal lobe and executive function deficit following -- Impact on safety and function Mild aspiration risk   Treatment Recommendations 11/14/2021 Treatment Recommendations Therapy as outlined in treatment plan below   Prognosis 11/14/2021 Prognosis for Safe Diet Advancement Good Barriers to Reach Goals -- Barriers/Prognosis Comment -- Diet Recommendations 11/14/2021 SLP Diet Recommendations Thin liquid Liquid Administration via Cup;Straw Medication Administration Crushed with puree Compensations Slow rate;Small sips/bites Postural Changes Seated upright at 90 degrees   Other Recommendations 11/14/2021 Recommended Consults -- Oral Care Recommendations Oral care BID Other Recommendations -- Follow Up Recommendations Acute inpatient rehab (3hours/day) Assistance recommended at discharge Intermittent Supervision/Assistance Functional Status Assessment Patient has had a recent decline in their functional status and demonstrates the ability to make significant improvements in function in a reasonable and predictable amount of time. Frequency and Duration  11/14/2021 Speech  Therapy Frequency (ACUTE ONLY) min 2x/week Treatment Duration 2 weeks   Oral Phase 11/14/2021 Oral Phase Impaired Oral - Pudding Teaspoon -- Oral - Pudding Cup -- Oral - Honey Teaspoon -- Oral - Honey Cup -- Oral - Nectar Teaspoon Weak lingual manipulation Oral - Nectar Cup -- Oral - Nectar Straw Weak lingual manipulation Oral - Thin Teaspoon -- Oral - Thin Cup Weak lingual manipulation Oral - Thin Straw Weak lingual manipulation Oral - Puree Weak lingual manipulation;Delayed oral transit Oral - Mech Soft -- Oral - Regular -- Oral - Multi-Consistency -- Oral - Pill -- Oral Phase -  Comment --  Pharyngeal Phase 11/14/2021 Pharyngeal Phase Impaired Pharyngeal- Pudding Teaspoon -- Pharyngeal -- Pharyngeal- Pudding Cup -- Pharyngeal -- Pharyngeal- Honey Teaspoon -- Pharyngeal -- Pharyngeal- Honey Cup -- Pharyngeal -- Pharyngeal- Nectar Teaspoon Reduced airway/laryngeal closure;Penetration/Aspiration during swallow Pharyngeal Material enters airway, remains ABOVE vocal cords and not ejected out Pharyngeal- Nectar Cup -- Pharyngeal -- Pharyngeal- Nectar Straw WFL Pharyngeal -- Pharyngeal- Thin Teaspoon -- Pharyngeal -- Pharyngeal- Thin Cup WFL Pharyngeal -- Pharyngeal- Thin Straw Reduced airway/laryngeal closure;Penetration/Aspiration during swallow Pharyngeal Material enters airway, remains ABOVE vocal cords and not ejected out Pharyngeal- Puree WFL Pharyngeal -- Pharyngeal- Mechanical Soft -- Pharyngeal -- Pharyngeal- Regular -- Pharyngeal -- Pharyngeal- Multi-consistency -- Pharyngeal -- Pharyngeal- Pill -- Pharyngeal -- Pharyngeal Comment --  Cervical Esophageal Phase  11/14/2021 Cervical Esophageal Phase WFL Pudding Teaspoon -- Pudding Cup -- Honey Teaspoon -- Honey Cup -- Nectar Teaspoon -- Nectar Cup -- Nectar Straw -- Thin Teaspoon -- Thin Cup -- Thin Straw -- Puree -- Mechanical Soft -- Regular -- Multi-consistency -- Pill -- Cervical Esophageal Comment -- Osie Bond., M.A. CCC-SLP Acute Rehabilitation Services Pager [phone] Office [phone] 11/14/2021, 9:34 AM                     CT BONE MARROW BIOPSY & ASPIRATION  Result Date: 11/26/2021 INDICATION: 71 year old female with history of thrombocytopenia. EXAM: CT-GUIDED BONE MARROW BIOPSY AND ASPIRATION MEDICATIONS: None ANESTHESIA/SEDATION: Fentanyl 25 mcg IV; Versed 1 mg IV Sedation Time: 10 minutes; The patient was continuously monitored during the procedure by the interventional radiology nurse under my direct supervision. COMPLICATIONS: None immediate. PROCEDURE: Informed consent was obtained from the patient following an  explanation of the procedure, risks, benefits and alternatives. The patient understands, agrees and consents for the procedure. All questions were addressed. A time out was performed prior to the initiation of the procedure. The patient was positioned prone and non-contrast localization CT was performed of the pelvis to demonstrate the iliac marrow spaces. The operative site was prepped and draped in the usual sterile fashion. Under sterile conditions and local anesthesia, a 22 gauge spinal needle was utilized for procedural planning. Next, an 11 gauge coaxial bone biopsy needle was advanced into the left iliac marrow space. Needle position was confirmed with CT imaging. Initially, a bone marrow aspiration was performed. Next, a bone marrow biopsy was obtained with the 11 gauge outer bone marrow device. Samples were prepared with the cytotechnologist and deemed adequate. The needle was removed and superficial hemostasis was obtained with manual compression. A dressing was applied. The patient tolerated the procedure well without immediate post procedural complication. IMPRESSION: Successful CT guided left iliac bone marrow aspiration and core biopsy. Ruthann Cancer, MD [facility] Radiology Electronically Signed   By: Ruthann Cancer M.D.   On: 11/26/2021 10:28   VAS [name] LOWER EXTREMITY VENOUS (DVT)  Result Date: 11/06/2021  Lower Venous DVT Study Patient Name:  KEYLIE BEAVERS  Date of Exam:   11/06/2021 Medical Rec #: [id number]        Accession #:    [id number] Date of Birth: [birth year]        Patient Gender: F Patient Age:   39 years Exam Location:  [facility] Procedure:      VAS [name] LOWER EXTREMITY VENOUS (DVT) Referring Phys: Noemi Chapel --------------------------------------------------------------------------------  Indications: Pain.  Risk Factors: None identified. Limitations: Body habitus, poor ultrasound/tissue interface and patient positioning,  patient immobility. Comparison Study: 11/02/2021 - Negative for DVT bilaterally. Performing Technologist: Oliver Hum RVT  Examination Guidelines: A complete evaluation includes B-mode imaging, spectral Doppler, color Doppler, and power Doppler as needed of all accessible portions of each vessel. Bilateral testing is considered an integral part of a complete examination. Limited examinations for reoccurring indications may be performed as noted. The reflux portion of the exam is performed with the patient in reverse Trendelenburg.  +---------+---------------+---------+-----------+----------+--------------+  RIGHT     Compressibility Phasicity Spontaneity Properties Thrombus Aging  +---------+---------------+---------+-----------+----------+--------------+  CFV       Full            Yes       Yes                                    +---------+---------------+---------+-----------+----------+--------------+  SFJ       Full                                                             +---------+---------------+---------+-----------+----------+--------------+  FV Prox   Full                                                             +---------+---------------+---------+-----------+----------+--------------+  FV Mid    Full                                                             +---------+---------------+---------+-----------+----------+--------------+  FV Distal                 Yes       Yes                                    +---------+---------------+---------+-----------+----------+--------------+  PFV       Full                                                             +---------+---------------+---------+-----------+----------+--------------+  POP       Full  Yes       Yes                                    +---------+---------------+---------+-----------+----------+--------------+  PTV       Full                                                              +---------+---------------+---------+-----------+----------+--------------+  PERO      Full                                                             +---------+---------------+---------+-----------+----------+--------------+   +----+---------------+---------+-----------+----------+--------------+  LEFT Compressibility Phasicity Spontaneity Properties Thrombus Aging  +----+---------------+---------+-----------+----------+--------------+  CFV  Full            Yes       Yes                                    +----+---------------+---------+-----------+----------+--------------+    Summary: RIGHT: - There is no evidence of deep vein thrombosis in the lower extremity. However, portions of this examination were limited- see technologist comments above.  - No cystic structure found in the popliteal fossa.  LEFT: - No evidence of common femoral vein obstruction.  *See table(s) above for measurements and observations. Electronically signed by Harold Barban MD on 11/06/2021 at 9:44:21 PM.    Final       Subjective: No new complaints, feeling better  Discharge Exam: Vitals:   12/04/21 0400 12/04/21 0500 12/04/21 0858 12/04/21 1123  BP: (!) 153/84  (!) 159/89 (!) 147/90  Pulse: 99  96 97  Resp: 12  13 16   Temp: 98.1 F (36.7 C)  98.1 F (36.7 C) 98.6 F (37 C)  TempSrc: Oral  Oral Oral  SpO2: 94%  96% 93%  Weight:  79.4 kg    Height:        General: Pt is alert, awake, not in acute distress, cortrak tube in place Cardiovascular: RRR, S1/S2 +, no rubs, no gallops Respiratory: CTA bilaterally, no wheezing, no rhonchi Abdominal: Soft, NT, ND, bowel sounds + Extremities: no edema, no cyanosis    The results of significant diagnostics from this hospitalization (including imaging, microbiology, ancillary and laboratory) are listed below for reference.     Microbiology: Recent Results (from the past 240 hour(s))  Urine Culture     Status: Abnormal   Collection Time: 11/28/21 12:47 PM   Specimen:  Urine, Catheterized  Result Value Ref Range Status   Specimen Description URINE, CATHETERIZED  Final   Special Requests   Final    NONE Performed at [facility], [address], [place], [place] [address]    Culture (A)  Final    10,000 COLONIES/mL PSEUDOMONAS AERUGINOSA >=100,000 COLONIES/mL YEAST    Report Status 11/30/2021 FINAL  Final   Organism ID, Bacteria PSEUDOMONAS AERUGINOSA (A)  Final      Susceptibility   Pseudomonas aeruginosa - MIC*  CEFTAZIDIME <=1 SENSITIVE Sensitive     CIPROFLOXACIN <=0.25 SENSITIVE Sensitive     GENTAMICIN <=1 SENSITIVE Sensitive     IMIPENEM <=0.25 SENSITIVE Sensitive     PIP/TAZO <=4 SENSITIVE Sensitive     CEFEPIME 1 SENSITIVE Sensitive     * 10,000 COLONIES/mL PSEUDOMONAS AERUGINOSA     Labs: BNP (last 3 results) Recent Labs    10/29/21 1900 10/30/21 0100 10/30/21 0700  BNP 1,141.5* 877.3* 628.3*   Basic Metabolic Panel: Recent Labs  Lab 11/29/21 0548 11/30/21 0309 12/01/21 0147 12/02/21 0159 12/03/21 0713 12/04/21 0105  NA 135 133* 133* 132* 134* 134*  K 4.2 4.6 5.3* 4.9 5.3* 4.6  CL 99 97* 97* 95* 95* 97*  CO2 27 25 24 27 27 28   GLUCOSE 163* 99 182* 290* 98 129*  BUN 26* 39* 56* 29* 45* 53*  CREATININE 2.95* 4.04* 5.02* 2.96* 4.45* 4.97*  CALCIUM 8.4* 8.6* 8.7* 8.2* 8.8* 8.8*  MG 1.8 1.9 2.1 1.8 2.0 2.0  PHOS 2.7 2.8 3.0 3.2 4.3  --    Liver Function Tests: Recent Labs  Lab 11/28/21 0246 11/29/21 0548 11/30/21 0309 12/01/21 0147 12/02/21 0159 12/03/21 0713 12/04/21 0105  AST 49*  --   --   --   --   --  34  ALT 29  --   --   --   --   --  44  ALKPHOS 148*  --   --   --   --   --  152*  BILITOT 0.8  --   --   --   --   --  0.6  PROT 6.0*  --   --   --   --   --  6.5  ALBUMIN 2.4*   < > 2.5* 2.5* 2.4* 2.5* 2.8*   < > = values in this interval not displayed.   No results for input(s): LIPASE, AMYLASE in the last 168 hours. No results for input(s): AMMONIA in the last 168 hours. CBC: Recent Labs   Lab 11/30/21 0309 12/01/21 0147 12/02/21 0159 12/03/21 0713 12/04/21 0105  WBC 8.3 9.2 8.2 9.7 9.4  NEUTROABS  --   --   --  7.1 6.7  HGB 7.4* 7.2* 7.4* 7.3* 7.8*  HCT 22.3* 22.7* 22.9* 23.0* 23.6*  MCV 89.6 90.4 90.2 90.2 88.7  PLT 77* 87* 61* 78* 91*   Cardiac Enzymes: No results for input(s): CKTOTAL, CKMB, CKMBINDEX, TROPONINI in the last 168 hours. BNP: Invalid input(s): POCBNP CBG: Recent Labs  Lab 12/04/21 0000 12/04/21 0411 12/04/21 0603 12/04/21 0902 12/04/21 1112  GLUCAP 162* 72 93 83 119*   D-Dimer No results for input(s): DDIMER in the last 72 hours. Hgb A1c No results for input(s): HGBA1C in the last 72 hours. Lipid Profile No results for input(s): CHOL, HDL, LDLCALC, TRIG, CHOLHDL, LDLDIRECT in the last 72 hours. Thyroid function studies No results for input(s): TSH, T4TOTAL, T3FREE, THYROIDAB in the last 72 hours.  Invalid input(s): FREET3 Anemia work up No results for input(s): VITAMINB12, FOLATE, FERRITIN, TIBC, IRON, RETICCTPCT in the last 72 hours. Urinalysis    Component Value Date/Time   COLORURINE COLORLESS (A) 11/28/2021 1739   APPEARANCEUR TURBID (A) 11/28/2021 1739   LABSPEC  11/28/2021 1739    TEST NOT REPORTED DUE TO COLOR INTERFERENCE OF URINE PIGMENT   PHURINE  11/28/2021 1739    TEST NOT REPORTED DUE TO COLOR INTERFERENCE OF URINE PIGMENT   GLUCOSEU (A) 11/28/2021 1739  TEST NOT REPORTED DUE TO COLOR INTERFERENCE OF URINE PIGMENT   HGBUR (A) 11/28/2021 1739    TEST NOT REPORTED DUE TO COLOR INTERFERENCE OF URINE PIGMENT   BILIRUBINUR (A) 11/28/2021 1739    TEST NOT REPORTED DUE TO COLOR INTERFERENCE OF URINE PIGMENT   KETONESUR (A) 11/28/2021 1739    TEST NOT REPORTED DUE TO COLOR INTERFERENCE OF URINE PIGMENT   PROTEINUR (A) 11/28/2021 1739    TEST NOT REPORTED DUE TO COLOR INTERFERENCE OF URINE PIGMENT   NITRITE (A) 11/28/2021 1739    TEST NOT REPORTED DUE TO COLOR INTERFERENCE OF URINE PIGMENT   LEUKOCYTESUR (A)  11/28/2021 1739    TEST NOT REPORTED DUE TO COLOR INTERFERENCE OF URINE PIGMENT   Sepsis Labs Invalid input(s): PROCALCITONIN,  WBC,  LACTICIDVEN Microbiology Recent Results (from the past 240 hour(s))  Urine Culture     Status: Abnormal   Collection Time: 11/28/21 12:47 PM   Specimen: Urine, Catheterized  Result Value Ref Range Status   Specimen Description URINE, CATHETERIZED  Final   Special Requests   Final    NONE Performed at [facility], [place] [address], [place], [place] [address]    Culture (A)  Final    10,000 COLONIES/mL PSEUDOMONAS AERUGINOSA >=100,000 COLONIES/mL YEAST    Report Status 11/30/2021 FINAL  Final   Organism ID, Bacteria PSEUDOMONAS AERUGINOSA (A)  Final      Susceptibility   Pseudomonas aeruginosa - MIC*    CEFTAZIDIME <=1 SENSITIVE Sensitive     CIPROFLOXACIN <=0.25 SENSITIVE Sensitive     GENTAMICIN <=1 SENSITIVE Sensitive     IMIPENEM <=0.25 SENSITIVE Sensitive     PIP/TAZO <=4 SENSITIVE Sensitive     CEFEPIME 1 SENSITIVE Sensitive     * 10,000 COLONIES/mL PSEUDOMONAS AERUGINOSA     Time coordinating discharge: 43mns  SIGNED:   JKathie Dike MD  [facility] 12/04/2021, 3:57 PM   If 7PM-7AM, please contact night-coverage [email or web address]

## 2021-12-04 NOTE — H&P (Signed)
Physical Medicine and Rehabilitation Admission H&P    Chief Complaint  Patient presents with   Cardiac Arrest  : RAQ:[id number] M Camerer 71 year old right-handed female with history of asthma, diabetes mellitus, fibromyalgia, hypertension, hyperlipidemia.  She quit smoking 11 years ago.  Presented 10/28/2021 after a presyncopal episode at home where her daughter lowered her to the ground.  She regained some consciousness and told her daughter she felt like her blood glucose was low.  She lost consciousness right before EMS arrived.  EMS arrived she was VF and had 20 minutes of CPR.  She received shock, amiodarone and epinephrine.  She coded twice in the ED requiring intubation for airway protection.  She required frequent pushes of epinephrine for bradycardia and hypotension.  EKG junctional rhythm concerning for STEMI.  Angiogram of the chest showed saddle pulmonary embolus extending into the segmental and subsegmental levels in the left lower lobe in all 3 right lobes.  Associated Right heart strain.  Vascular ultrasound lower extremities negative for DVT.  She did require mechanical thrombectomy for pulmonary emboli.  Patient was slowly weaned from pressors.  She was extubated 11/06/2021.  On 11/08/2021 MRI abdomen showed a large area of intrahepatic hemorrhage and she did require multiple transfusions and underwent embolization 10/31/2021.  Nephrology consulted for renal failure secondary to ATN and hemodialysis initiated and awaiting plan for long-term hemodialysis.  Bouts of thrombocytopenia suspect ITP and was getting Nplate prn and Dex.  With latest hemoglobin 7.8 platelet count improved to 91,000.  She was cleared to begin Eliquis for history of pulmonary emboli.  Therapy evaluations completed due to patient decreased functional mobility was admitted for a comprehensive rehab program.  Has Cortrak- not eating great.  HD scheduled for 2/8- will try to do in afternoon- will try to do M/W/F- per  Nephrology.  RLE hurts- aching, burning, etc.  LBM last night.  Peeing/via Foley that's in place   Review of Systems  Constitutional:  Negative for chills and fever.  HENT:  Negative for hearing loss.   Eyes:  Negative for blurred vision and double vision.  Respiratory:  Positive for shortness of breath. Negative for cough.   Cardiovascular:  Positive for palpitations and leg swelling. Negative for chest pain.  Gastrointestinal:  Positive for constipation. Negative for heartburn, nausea and vomiting.       GERD  Genitourinary:  Negative for dysuria, flank pain and hematuria.  Musculoskeletal:  Positive for joint pain and myalgias.  Skin:  Negative for rash.  Psychiatric/Behavioral:         Anxiety  All other systems reviewed and are negative. Past Medical History:  Diagnosis Date   Anxiety    Arthritis    Asthma    Cardiac arrest ([place]) 10/28/2021   Cardiac arrest ([place]) 10/28/2021   Depression    Diabetes mellitus without complication (HCC)    Fibromyalgia    GERD (gastroesophageal reflux disease)    Hyperlipemia    Hypertension    PONV (postoperative nausea and vomiting)    Past Surgical History:  Procedure Laterality Date   COLONOSCOPY     DILATION AND CURETTAGE OF UTERUS     IR EMBO ART  VEN HEMORR LYMPH EXTRAV  INC GUIDE ROADMAPPING  11/01/2021   IR FLUORO GUIDE CV LINE RIGHT  11/20/2021   IR THROMBECT PRIM MECH INIT (INCLU) MOD SED  10/29/2021   IR US GUIDE VASC ACCESS RIGHT  10/29/2021   IR US GUIDE VASC ACCESS RIGHT  11/01/2021  IR US GUIDE VASC ACCESS RIGHT  11/21/2021   KNEE ARTHROSCOPY  [id number]   left   SHOULDER ARTHROSCOPY     left   TRIGGER FINGER RELEASE  08/13/2012   Procedure: RELEASE TRIGGER FINGER/A-1 PULLEY;  Surgeon: [name]., MD;  Location: [facility];  Service: Orthopedics;  Laterality: Right;  long finger   TUBAL LIGATION     Family History  Problem Relation Age of Onset   Diabetes Mother    Heart attack Father    Diabetes  Sister    Throat cancer Brother    Hypertension Daughter    Social History:  reports that she quit smoking about 11 years ago. Her smoking use included cigarettes. She has a 10.00 pack-year smoking history. She has never used smokeless tobacco. She reports that she does not drink alcohol and does not use drugs. Allergies:  Allergies  Allergen Reactions   Aspirin Nausea And Vomiting    Other reaction(s): Unknown   Benadryl [Diphenhydramine]     Can only take dye free. States the dye causes itching.   Darvon [Propoxyphene] Itching    Can only during the day. Night time makes her itch   Hydrocodone Bit-Homatrop Mbr Itching   Metformin     Other reaction(s): GI   Other     Other reaction(s): Unknown. States reaction was to nasal spray that caused pupils to shrink   Pentazocine     nervous Other reaction(s): jitters   Percocet [Oxycodone-Acetaminophen]     itching   Sulfa Antibiotics Hives   Tetracaine Hcl     Other reaction(s): Unknown. Thinks caused itching.   Tetracyclines & Related Hives   Tramadol     Other reaction(s): itch   Medications Prior to Admission  Medication Sig Dispense Refill   acetaminophen (TYLENOL) 500 MG tablet Take 500-1,000 mg by mouth every 6 (six) hours as needed for mild pain or headache.     albuterol (ACCUNEB) 1.25 MG/3ML nebulizer solution Take 1.25 mg by nebulization every 8 (eight) hours as needed for wheezing or shortness of breath.     albuterol (PROVENTIL HFA;VENTOLIN HFA) 108 (90 BASE) MCG/ACT inhaler Inhale 1-2 puffs into the lungs every 4 (four) hours as needed for wheezing or shortness of breath.      ALPRAZolam (XANAX) 0.25 MG tablet Take 0.25 mg by mouth daily as needed for anxiety.     baclofen (LIORESAL) 10 MG tablet TAKE 1 TABLET(10 MG) BY MOUTH TWICE DAILY AS NEEDED FOR MUSCLE SPASMS (Patient taking differently: Take 10 mg by mouth 2 (two) times daily as needed for muscle spasms.) 60 tablet 2   betamethasone dipropionate (DIPROLENE) 0.05 %  ointment 1 application to affected area     diclofenac Sodium (VOLTAREN) 1 % GEL Apply 4 g topically daily as needed (pain).     diphenhydrAMINE HCl (BENADRYL ALLERGY PO)      Elastic Bandages & Supports (MEDICAL COMPRESSION STOCKINGS) MISC 1 each by Does not apply route every 6 (six) hours as needed (swelling). 2 each 0   fluticasone (FLONASE) 50 MCG/ACT nasal spray Place 2 sprays into both nostrils daily.     gabapentin (NEURONTIN) 300 MG capsule Take 300 mg by mouth 2 (two) times daily.     hydrochlorothiazide (HYDRODIURIL) 12.5 MG tablet Take 12.5 mg by mouth daily.     LANTUS SOLOSTAR 100 UNIT/ML Solostar Pen Inject 22 Units into the skin at bedtime.     pantoprazole (PROTONIX) 40 MG tablet Take 40 mg  by mouth 2 (two) times daily.     repaglinide (PRANDIN) 2 MG tablet Take 2 mg by mouth 2 (two) times daily before a meal.     simvastatin (ZOCOR) 40 MG tablet Take 40 mg by mouth daily.     temazepam (RESTORIL) 15 MG capsule TAKE 1 CAPSULE(15 MG) BY MOUTH AT BEDTIME AS NEEDED (Patient taking differently: Take 15 mg by mouth at bedtime as needed for sleep.) 30 capsule 0   Vitamin D, Ergocalciferol, (DRISDOL) 1.25 MG (50000 UNIT) CAPS capsule Take 50,000 Units by mouth once a week.        Home: Home Living Family/patient expects to be discharged to:: Private residence Living Arrangements: Alone Available Help at Discharge: Family, Available 24 hours/day Type of Home: Apartment Home Access: Stairs to enter [employer] of Steps: 1 Entrance Stairs-Rails: None Home Layout: Able to live on main level with bedroom/bathroom, Multi-level, 1/2 bath on main level, Bed/bath upstairs Alternate Level Stairs-Number of Steps: 6 stairs, landing, then 6 more Alternate Level Stairs-Rails: Left Bathroom Shower/Tub: Chiropodist: Handicapped height Bathroom Accessibility: Yes Home Equipment: Civil engineer, contracting, Radio producer - quad, BSC/3in1, Hand held shower head  Lives With: Alone    Functional History: Prior Function Prior Level of Function : Independent/Modified Independent, Driving Mobility Comments: Going to [employer]. ADLs Comments: ADLs, IADLs, driving, and enjoys cooking  Functional Status:  Mobility: Bed Mobility Overal bed mobility: Needs Assistance Bed Mobility: Supine to Sit Rolling: Min guard Sidelying to sit: Mod assist, HOB elevated, +2 for physical assistance Supine to sit: Min assist, +2 for physical assistance Sit to supine: Min assist Sit to sidelying: Supervision General bed mobility comments: Min A +2 to elevate trunk, pt reaching for handheld assist Transfers Overall transfer level: Needs assistance Equipment used: Rolling walker (2 wheels) Transfers: Sit to/from Stand Sit to Stand: Min assist, +2 physical assistance, +2 safety/equipment, From elevated surface, Max assist Bed to/from chair/wheelchair/BSC transfer type:: Stand pivot Stand pivot transfers: Max assist, +2 safety/equipment, +2 physical assistance Transfer via Lift Equipment: Stedy General transfer comment: Min A + 2 for power up from edge of bed with RW. Max A +2 for correcting posterior lean once in standing. Ambulation/Gait Ambulation/Gait assistance: +2 physical assistance, Max assist Gait Distance (Feet): 3 Feet Assistive device: Rolling walker (2 wheels) Gait Pattern/deviations: Step-to pattern, Decreased weight shift to right, Decreased dorsiflexion - right, Knee hyperextension - right General Gait Details: Max multimodal cueing for sequencing, technique, use of arms, weight shifting anteriorly, and glute activation. Pt with retropulsion and right knee hyperextension. close chair follow Gait velocity: decreased Pre-gait activities: Stood with walker x 30 sec with mod assist working on upright posture and hip extension    ADL: ADL Overall ADL's : Needs assistance/impaired Eating/Feeding: Set up, Supervision/ safety, Sitting Eating/Feeding Details (indicate cue  type and reason): Set up for breakfast while sitting in recliner. Grooming: Minimal assistance, Sitting, Wash/dry hands Grooming Details (indicate cue type and reason): Min A for reaching soap and facilitating weight shift forward and reach. Pt demonstrating decreased coorindation and FM skills during oral care; dropping grooming items and difficulty with targeted reach. Upper Body Bathing: Maximal assistance, Bed level Lower Body Bathing: Total assistance, Bed level Upper Body Dressing : Maximal assistance, Bed level Lower Body Dressing: Bed level, Maximal assistance Lower Body Dressing Details (indicate cue type and reason): don socks Toilet Transfer: Maximal assistance, +2 for physical assistance, Ambulation, Rolling walker (2 wheels) (simulated to recliner) Toilet Transfer Details (indicate cue type and reason):  Max A +2 for decreasing posterior lean and maintaining balance. buckling of R knee in standing Toileting- Clothing Manipulation and Hygiene: Total assistance Functional mobility during ADLs: Maximal assistance, +2 for physical assistance (3 ft) General ADL Comments: Pt performing sit<>Stand and then short distance forward gait with Max A +2. Pt then completing grooming tasks at sink with MIn A. demonstrating increased activity tolerance and engagement  Cognition: Cognition Overall Cognitive Status: Impaired/Different from baseline Orientation Level: Oriented X4 Cognition Arousal/Alertness: Awake/alert Behavior During Therapy: WFL for tasks assessed/performed, Flat affect (More engaged. Slightly flat) Overall Cognitive Status: Impaired/Different from baseline Area of Impairment: Attention, Following commands, Safety/judgement, Awareness, Problem solving Current Attention Level: Selective Following Commands: Follows one step commands with increased time Safety/Judgement: Decreased awareness of deficits Awareness: Emergent Problem Solving: Slow processing, Decreased initiation,  Requires verbal cues, Difficulty sequencing, Requires tactile cues General Comments: Continues to present with decreased problem solving and requiring increased time and cues. More engaged this session. Willing to follow commands and participate in therapy. Smiling and thankful for therapy  Physical Exam: Blood pressure (!) 147/90, pulse 97, temperature 98.6 F (37 C), temperature source Oral, resp. rate 16, height 5\' 5"  (1.651 m), weight 79.4 kg, SpO2 93 %. Physical Exam Vitals and nursing note reviewed.  Constitutional:      Comments: Frail appearing pt- sitting up in bedside chair; NAD- c/oo chest pain from CPR- soreness as compared to "pain". On monitor;   HENT:     Head: Normocephalic and atraumatic.     Right Ear: External ear normal.     Left Ear: External ear normal.     Nose: Nose normal. No congestion.     Mouth/Throat:     Mouth: Mucous membranes are dry.     Pharynx: Oropharynx is clear. No oropharyngeal exudate.  Eyes:     General:        Right eye: No discharge.        Left eye: No discharge.     Extraocular Movements: Extraocular movements intact.  Cardiovascular:     Rate and Rhythm: Normal rate and regular rhythm.     Heart sounds: Normal heart sounds. No murmur heard.   No gallop.  Pulmonary:     Comments: CTA B/L- decreased at bases B/L- -  Abdominal:     Comments: Normoactive BS; soft, NT; ND  Genitourinary:    Comments: Foley tube in place- medium amber urine in bag Musculoskeletal:     Cervical back: Neck supple. No tenderness.     Comments: UE 5-/5 B/L  LE 3/5 HF/KE- DF 4-/5 on L and 2-/5 on R; PF 4-/5 B/L  R foot drop- at rest in PF   Skin:    General: Skin is warm and dry.     Comments: R chest HD catheter  Neurological:     Comments: Patient is a bit lethargic.  Makes eye contact with examiner.  Follows simple commands.  Limited medical historian. Mild tremors with movement Numb/absent/decreased sensation from mid calf to toes B/L L>R Vague- Ox2  with cues  Psychiatric:     Comments: Pleasant, appropriate    Results for orders placed or performed during the hospital encounter of 10/28/21 (from the past 48 hour(s))  Glucose, capillary     Status: Abnormal   Collection Time: 12/02/21  4:54 PM  Result Value Ref Range   Glucose-Capillary 101 (H) 70 - 99 mg/dL    Comment: Glucose reference range applies only to samples taken after fasting for  at least 8 hours.  Glucose, capillary     Status: Abnormal   Collection Time: 12/02/21  8:07 PM  Result Value Ref Range   Glucose-Capillary 164 (H) 70 - 99 mg/dL    Comment: Glucose reference range applies only to samples taken after fasting for at least 8 hours.  Glucose, capillary     Status: Abnormal   Collection Time: 12/03/21 12:03 AM  Result Value Ref Range   Glucose-Capillary 111 (H) 70 - 99 mg/dL    Comment: Glucose reference range applies only to samples taken after fasting for at least 8 hours.  Glucose, capillary     Status: Abnormal   Collection Time: 12/03/21  4:14 AM  Result Value Ref Range   Glucose-Capillary 65 (L) 70 - 99 mg/dL    Comment: Glucose reference range applies only to samples taken after fasting for at least 8 hours.  Glucose, capillary     Status: None   Collection Time: 12/03/21  4:39 AM  Result Value Ref Range   Glucose-Capillary 74 70 - 99 mg/dL    Comment: Glucose reference range applies only to samples taken after fasting for at least 8 hours.   Comment 1 Notify RN    Comment 2 Document in Chart   Renal function panel (daily at 0500)     Status: Abnormal   Collection Time: 12/03/21  7:13 AM  Result Value Ref Range   Sodium 134 (L) 135 - 145 mmol/L   Potassium 5.3 (H) 3.5 - 5.1 mmol/L   Chloride 95 (L) 98 - 111 mmol/L   CO2 27 22 - 32 mmol/L   Glucose, Bld 98 70 - 99 mg/dL    Comment: Glucose reference range applies only to samples taken after fasting for at least 8 hours.   BUN 45 (H) 8 - 23 mg/dL   Creatinine, Ser 4.45 (H) 0.44 - 1.00 mg/dL     Comment: REPEATED TO VERIFY   Calcium 8.8 (L) 8.9 - 10.3 mg/dL   Phosphorus 4.3 2.5 - 4.6 mg/dL   Albumin 2.5 (L) 3.5 - 5.0 g/dL   GFR, Estimated 10 (L) >60 mL/min    Comment: (NOTE) Calculated using the CKD-EPI Creatinine Equation (2021)    Anion gap 12 5 - 15    Comment: Performed at [facility] [address] [place], [place] [address]  Magnesium     Status: None   Collection Time: 12/03/21  7:13 AM  Result Value Ref Range   Magnesium 2.0 1.7 - 2.4 mg/dL    Comment: Performed at [facility] [address] [place], [place] [address]  CBC with Differential/Platelet     Status: Abnormal   Collection Time: 12/03/21  7:13 AM  Result Value Ref Range   WBC 9.7 4.0 - 10.5 K/uL   RBC 2.55 (L) 3.87 - 5.11 MIL/uL   Hemoglobin 7.3 (L) 12.0 - 15.0 g/dL   HCT 23.0 (L) 36.0 - 46.0 %   MCV 90.2 80.0 - 100.0 fL   MCH 28.6 26.0 - 34.0 pg   MCHC 31.7 30.0 - 36.0 g/dL   RDW 15.9 (H) 11.5 - 15.5 %   Platelets 78 (L) 150 - 400 K/uL    Comment: Immature Platelet Fraction may be clinically indicated, consider ordering this additional test [id number] CONSISTENT WITH PREVIOUS RESULT REPEATED TO VERIFY    nRBC 0.0 0.0 - 0.2 %   Neutrophils Relative % 72 %   Neutro Abs 7.1 1.7 - 7.7 K/uL  Lymphocytes Relative 13 %   Lymphs Abs 1.2 0.7 - 4.0 K/uL   Monocytes Relative 11 %   Monocytes Absolute 1.1 (H) 0.1 - 1.0 K/uL   Eosinophils Relative 2 %   Eosinophils Absolute 0.2 0.0 - 0.5 K/uL   Basophils Relative 1 %   Basophils Absolute 0.1 0.0 - 0.1 K/uL   Immature Granulocytes 1 %   Abs Immature Granulocytes 0.06 0.00 - 0.07 K/uL    Comment: Performed at [facility] [address], [place], [place] [address]  Glucose, capillary     Status: None   Collection Time: 12/03/21  8:28 AM  Result Value Ref Range   Glucose-Capillary 94 70 - 99 mg/dL    Comment: Glucose reference range applies only to samples taken after fasting for at least 8 hours.  Glucose, capillary     Status:  Abnormal   Collection Time: 12/03/21 11:48 AM  Result Value Ref Range   Glucose-Capillary 145 (H) 70 - 99 mg/dL    Comment: Glucose reference range applies only to samples taken after fasting for at least 8 hours.  Glucose, capillary     Status: Abnormal   Collection Time: 12/03/21  4:49 PM  Result Value Ref Range   Glucose-Capillary 62 (L) 70 - 99 mg/dL    Comment: Glucose reference range applies only to samples taken after fasting for at least 8 hours.  Glucose, capillary     Status: Abnormal   Collection Time: 12/03/21  5:33 PM  Result Value Ref Range   Glucose-Capillary 59 (L) 70 - 99 mg/dL    Comment: Glucose reference range applies only to samples taken after fasting for at least 8 hours.  Glucose, capillary     Status: None   Collection Time: 12/03/21  6:12 PM  Result Value Ref Range   Glucose-Capillary 81 70 - 99 mg/dL    Comment: Glucose reference range applies only to samples taken after fasting for at least 8 hours.  Glucose, capillary     Status: Abnormal   Collection Time: 12/03/21  8:14 PM  Result Value Ref Range   Glucose-Capillary 104 (H) 70 - 99 mg/dL    Comment: Glucose reference range applies only to samples taken after fasting for at least 8 hours.  Glucose, capillary     Status: Abnormal   Collection Time: 12/04/21 12:00 AM  Result Value Ref Range   Glucose-Capillary 162 (H) 70 - 99 mg/dL    Comment: Glucose reference range applies only to samples taken after fasting for at least 8 hours.  Magnesium     Status: None   Collection Time: 12/04/21  1:05 AM  Result Value Ref Range   Magnesium 2.0 1.7 - 2.4 mg/dL    Comment: Performed at [facility], [place] [address], [place], [place] [address]  CBC with Differential/Platelet     Status: Abnormal   Collection Time: 12/04/21  1:05 AM  Result Value Ref Range   WBC 9.4 4.0 - 10.5 K/uL   RBC 2.66 (L) 3.87 - 5.11 MIL/uL   Hemoglobin 7.8 (L) 12.0 - 15.0 g/dL   HCT 23.6 (L) 36.0 - 46.0 %   MCV 88.7  80.0 - 100.0 fL   MCH 29.3 26.0 - 34.0 pg   MCHC 33.1 30.0 - 36.0 g/dL   RDW 15.8 (H) 11.5 - 15.5 %   Platelets 91 (L) 150 - 400 K/uL    Comment: Immature Platelet Fraction may be clinically indicated, consider ordering this  additional test [id number] CONSISTENT WITH PREVIOUS RESULT REPEATED TO VERIFY    nRBC 0.0 0.0 - 0.2 %   Neutrophils Relative % 71 %   Neutro Abs 6.7 1.7 - 7.7 K/uL   Lymphocytes Relative 15 %   Lymphs Abs 1.4 0.7 - 4.0 K/uL   Monocytes Relative 11 %   Monocytes Absolute 1.0 0.1 - 1.0 K/uL   Eosinophils Relative 2 %   Eosinophils Absolute 0.2 0.0 - 0.5 K/uL   Basophils Relative 1 %   Basophils Absolute 0.1 0.0 - 0.1 K/uL   Immature Granulocytes 0 %   Abs Immature Granulocytes 0.04 0.00 - 0.07 K/uL    Comment: Performed at [facility] [address], [place], [place] [address]  Comprehensive metabolic panel     Status: Abnormal   Collection Time: 12/04/21  1:05 AM  Result Value Ref Range   Sodium 134 (L) 135 - 145 mmol/L   Potassium 4.6 3.5 - 5.1 mmol/L   Chloride 97 (L) 98 - 111 mmol/L   CO2 28 22 - 32 mmol/L   Glucose, Bld 129 (H) 70 - 99 mg/dL    Comment: Glucose reference range applies only to samples taken after fasting for at least 8 hours.   BUN 53 (H) 8 - 23 mg/dL   Creatinine, Ser 4.97 (H) 0.44 - 1.00 mg/dL   Calcium 8.8 (L) 8.9 - 10.3 mg/dL   Total Protein 6.5 6.5 - 8.1 g/dL   Albumin 2.8 (L) 3.5 - 5.0 g/dL   AST 34 15 - 41 U/L   ALT 44 0 - 44 U/L   Alkaline Phosphatase 152 (H) 38 - 126 U/L   Total Bilirubin 0.6 0.3 - 1.2 mg/dL   GFR, Estimated 9 (L) >60 mL/min    Comment: (NOTE) Calculated using the CKD-EPI Creatinine Equation (2021)    Anion gap 9 5 - 15    Comment: Performed at [facility], [place] [address] [place], [place] [address]  Glucose, capillary     Status: None   Collection Time: 12/04/21  4:11 AM  Result Value Ref Range   Glucose-Capillary 72 70 - 99 mg/dL    Comment: Glucose reference range  applies only to samples taken after fasting for at least 8 hours.  Glucose, capillary     Status: None   Collection Time: 12/04/21  6:03 AM  Result Value Ref Range   Glucose-Capillary 93 70 - 99 mg/dL    Comment: Glucose reference range applies only to samples taken after fasting for at least 8 hours.  Glucose, capillary     Status: None   Collection Time: 12/04/21  9:02 AM  Result Value Ref Range   Glucose-Capillary 83 70 - 99 mg/dL    Comment: Glucose reference range applies only to samples taken after fasting for at least 8 hours.  Glucose, capillary     Status: Abnormal   Collection Time: 12/04/21 11:12 AM  Result Value Ref Range   Glucose-Capillary 119 (H) 70 - 99 mg/dL    Comment: Glucose reference range applies only to samples taken after fasting for at least 8 hours.   DG Abd 1 View  Result Date: 12/02/2021 CLINICAL DATA:  Nausea and vomiting EXAM: ABDOMEN - 1 VIEW COMPARISON:  11/16/2021 FINDINGS: Feeding tube tip in the right upper quadrant consistent with location in the distal stomach or possibly proximal duodenum. Bowel gas pattern is normal with scattered gas and stool throughout the colon. No small or large bowel  distention. No radiopaque stones. Degenerative changes in the spine. IMPRESSION: Normal nonobstructive bowel gas pattern. Electronically Signed   By: Lucienne Capers M.D.   On: 12/02/2021 20:25      Blood pressure (!) 147/90, pulse 97, temperature 98.6 F (37 C), temperature source Oral, resp. rate 16, height 5\' 5"  (1.651 m), weight 79.4 kg, SpO2 93 %.  Medical Problem List and Plan: 1. Functional deficits secondary to debility related to VF cardiac arrest secondary to massive pulmonary emboli status post mechanical thrombectomy  -patient may not shower due to HD catheter- unless it's removed  -ELOS/Goals: 10-14 days supervision to min A for PT/OT SLP 2.  Antithrombotics: -DVT/anticoagulation:  Pharmaceutical: Other (comment) Eliquis  -antiplatelet therapy:  N/A 3. Pain Management: Neurontin 100 mg twice daily, oxycodone as needed 4. Mood: Cymbalta 20 mg daily, Xanax 0.25 mg daily as needed provide emotional support  -antipsychotic agents: N/A 5. Neuropsych: This patient is? capable of making decisions on her own behalf. 6. Skin/Wound Care: Routine skin checks 7. Fluids/Electrolytes/Nutrition: Routine in and outs with follow-up chemistries 8.  Retroperitoneal bleed/hemorrhagic shock/acute blood loss anemia/status post left liver lobe embolization 9.  AKI secondary to ATN.  Follow-up per renal services.  Started clip for AKI- not ESRD, however Cr almost 5 today- getting HD tomorrow 2/8.  10.  Thrombocytopenia.  Suspect ITP.  Follow-up per hematology 11.  Hyperlipidemia.  Lipitor 12.  Diabetes mellitus.  Levemir 4 units twice daily 13. Poor appetite- has Cortrak for intake due to poor appetite- Will assess if still needs   I have personally performed a face to face diagnostic evaluation of this patient and formulated the key components of the plan.  Additionally, I have personally reviewed laboratory data, imaging studies, as well as relevant notes and concur with the physician assistant's documentation above.   The patient's status has not changed from the original H&P.  Any changes in documentation from the acute care chart have been noted above.     Lavon Paganini Angiulli, PA-C 12/04/2021

## 2021-12-04 NOTE — Progress Notes (Signed)
Attempted to give report to CIR with no answer.  Will try again.  Daymon Larsen, RN

## 2021-12-04 NOTE — Progress Notes (Signed)
Inpatient Rehab Admissions Coordinator:   I have a bed for this Pt. On CIR today. RN may call for report at [phone].  Clemens Catholic, [place], [place] Admissions Coordinator  [phone] ([place]) [phone] (office)

## 2021-12-04 NOTE — H&P (Signed)
Physical Medicine and Rehabilitation Admission H&P        Chief Complaint  Patient presents with   Cardiac Arrest  : [id number] M Corney 71 year old right-handed female with history of asthma, diabetes mellitus, fibromyalgia, hypertension, hyperlipidemia.  She quit smoking 11 years ago.  Presented 10/28/2021 after a presyncopal episode at home where her daughter lowered her to the ground.  She regained some consciousness and told her daughter she felt like her blood glucose was low.  She lost consciousness right before EMS arrived.  EMS arrived she was VF and had 20 minutes of CPR.  She received shock, amiodarone and epinephrine.  She coded twice in the ED requiring intubation for airway protection.  She required frequent pushes of epinephrine for bradycardia and hypotension.  EKG junctional rhythm concerning for STEMI.  Angiogram of the chest showed saddle pulmonary embolus extending into the segmental and subsegmental levels in the left lower lobe in all 3 right lobes.  Associated Right heart strain.  Vascular ultrasound lower extremities negative for DVT.  She did require mechanical thrombectomy for pulmonary emboli.  Patient was slowly weaned from pressors.  She was extubated 11/06/2021.  On 11/08/2021 MRI abdomen showed a large area of intrahepatic hemorrhage and she did require multiple transfusions and underwent embolization 10/31/2021.  Nephrology consulted for renal failure secondary to ATN and hemodialysis initiated and awaiting plan for long-term hemodialysis.  Bouts of thrombocytopenia suspect ITP and was getting Nplate prn and Dex.  With latest hemoglobin 7.8 platelet count improved to 91,000.  She was cleared to begin Eliquis for history of pulmonary emboli.  Therapy evaluations completed due to patient decreased functional mobility was admitted for a comprehensive rehab program.   Has Cortrak- not eating great.  HD scheduled for 2/8- will try to do in afternoon- will try to do M/W/F- per  Nephrology.  RLE hurts- aching, burning, etc.  LBM last night.  Peeing/via Foley that's in place     Review of Systems  Constitutional:  Negative for chills and fever.  HENT:  Negative for hearing loss.   Eyes:  Negative for blurred vision and double vision.  Respiratory:  Positive for shortness of breath. Negative for cough.   Cardiovascular:  Positive for palpitations and leg swelling. Negative for chest pain.  Gastrointestinal:  Positive for constipation. Negative for heartburn, nausea and vomiting.       GERD  Genitourinary:  Negative for dysuria, flank pain and hematuria.  Musculoskeletal:  Positive for joint pain and myalgias.  Skin:  Negative for rash.  Psychiatric/Behavioral:         Anxiety  All other systems reviewed and are negative.     Past Medical History:  Diagnosis Date   Anxiety     Arthritis     Asthma     Cardiac arrest ([place]) 10/28/2021   Cardiac arrest ([place]) 10/28/2021   Depression     Diabetes mellitus without complication (HCC)     Fibromyalgia     GERD (gastroesophageal reflux disease)     Hyperlipemia     Hypertension     PONV (postoperative nausea and vomiting)           Past Surgical History:  Procedure Laterality Date   COLONOSCOPY       DILATION AND CURETTAGE OF UTERUS       IR EMBO ART  VEN HEMORR LYMPH EXTRAV  INC GUIDE ROADMAPPING   11/01/2021   IR FLUORO GUIDE CV LINE RIGHT   11/20/2021  IR THROMBECT PRIM MECH INIT (INCLU) MOD SED   10/29/2021   IR US GUIDE VASC ACCESS RIGHT   10/29/2021   IR US GUIDE VASC ACCESS RIGHT   11/01/2021   IR US GUIDE VASC ACCESS RIGHT   11/21/2021   KNEE ARTHROSCOPY   2003,2005    left   SHOULDER ARTHROSCOPY        left   TRIGGER FINGER RELEASE   08/13/2012    Procedure: RELEASE TRIGGER FINGER/A-1 PULLEY;  Surgeon: [name]., MD;  Location: [facility];  Service: Orthopedics;  Laterality: Right;  long finger   TUBAL LIGATION             Family History  Problem Relation Age of Onset    Diabetes Mother     Heart attack Father     Diabetes Sister     Throat cancer Brother     Hypertension Daughter      Social History:  reports that she quit smoking about 11 years ago. Her smoking use included cigarettes. She has a 10.00 pack-year smoking history. She has never used smokeless tobacco. She reports that she does not drink alcohol and does not use drugs. Allergies:       Allergies  Allergen Reactions   Aspirin Nausea And Vomiting      Other reaction(s): Unknown   Benadryl [Diphenhydramine]        Can only take dye free. States the dye causes itching.   Darvon [Propoxyphene] Itching      Can only during the day. Night time makes her itch   Hydrocodone Bit-Homatrop Mbr Itching   Metformin        Other reaction(s): GI   Other        Other reaction(s): Unknown. States reaction was to nasal spray that caused pupils to shrink   Pentazocine        nervous Other reaction(s): jitters   Percocet [Oxycodone-Acetaminophen]        itching   Sulfa Antibiotics Hives   Tetracaine Hcl        Other reaction(s): Unknown. Thinks caused itching.   Tetracyclines & Related Hives   Tramadol        Other reaction(s): itch          Medications Prior to Admission  Medication Sig Dispense Refill   acetaminophen (TYLENOL) 500 MG tablet Take 500-1,000 mg by mouth every 6 (six) hours as needed for mild pain or headache.       albuterol (ACCUNEB) 1.25 MG/3ML nebulizer solution Take 1.25 mg by nebulization every 8 (eight) hours as needed for wheezing or shortness of breath.       albuterol (PROVENTIL HFA;VENTOLIN HFA) 108 (90 BASE) MCG/ACT inhaler Inhale 1-2 puffs into the lungs every 4 (four) hours as needed for wheezing or shortness of breath.        ALPRAZolam (XANAX) 0.25 MG tablet Take 0.25 mg by mouth daily as needed for anxiety.       baclofen (LIORESAL) 10 MG tablet TAKE 1 TABLET(10 MG) BY MOUTH TWICE DAILY AS NEEDED FOR MUSCLE SPASMS (Patient taking differently: Take 10 mg by mouth 2  (two) times daily as needed for muscle spasms.) 60 tablet 2   betamethasone dipropionate (DIPROLENE) 0.05 % ointment 1 application to affected area       diclofenac Sodium (VOLTAREN) 1 % GEL Apply 4 g topically daily as needed (pain).       diphenhydrAMINE HCl (BENADRYL ALLERGY PO)  Elastic Bandages & Supports (MEDICAL COMPRESSION STOCKINGS) MISC 1 each by Does not apply route every 6 (six) hours as needed (swelling). 2 each 0   fluticasone (FLONASE) 50 MCG/ACT nasal spray Place 2 sprays into both nostrils daily.       gabapentin (NEURONTIN) 300 MG capsule Take 300 mg by mouth 2 (two) times daily.       hydrochlorothiazide (HYDRODIURIL) 12.5 MG tablet Take 12.5 mg by mouth daily.       LANTUS SOLOSTAR 100 UNIT/ML Solostar Pen Inject 22 Units into the skin at bedtime.       pantoprazole (PROTONIX) 40 MG tablet Take 40 mg by mouth 2 (two) times daily.       repaglinide (PRANDIN) 2 MG tablet Take 2 mg by mouth 2 (two) times daily before a meal.       simvastatin (ZOCOR) 40 MG tablet Take 40 mg by mouth daily.       temazepam (RESTORIL) 15 MG capsule TAKE 1 CAPSULE(15 MG) BY MOUTH AT BEDTIME AS NEEDED (Patient taking differently: Take 15 mg by mouth at bedtime as needed for sleep.) 30 capsule 0   Vitamin D, Ergocalciferol, (DRISDOL) 1.25 MG (50000 UNIT) CAPS capsule Take 50,000 Units by mouth once a week.              Home: Home Living Family/patient expects to be discharged to:: Private residence Living Arrangements: Alone Available Help at Discharge: Family, Available 24 hours/day Type of Home: Apartment Home Access: Stairs to enter [employer] of Steps: 1 Entrance Stairs-Rails: None Home Layout: Able to live on main level with bedroom/bathroom, Multi-level, 1/2 bath on main level, Bed/bath upstairs Alternate Level Stairs-Number of Steps: 6 stairs, landing, then 6 more Alternate Level Stairs-Rails: Left Bathroom Shower/Tub: Chiropodist: Handicapped  height Bathroom Accessibility: Yes Home Equipment: Civil engineer, contracting, Radio producer - quad, BSC/3in1, Hand held shower head  Lives With: Alone   Functional History: Prior Function Prior Level of Function : Independent/Modified Independent, Driving Mobility Comments: Going to [employer]. ADLs Comments: ADLs, IADLs, driving, and enjoys cooking   Functional Status:  Mobility: Bed Mobility Overal bed mobility: Needs Assistance Bed Mobility: Supine to Sit Rolling: Min guard Sidelying to sit: Mod assist, HOB elevated, +2 for physical assistance Supine to sit: Min assist, +2 for physical assistance Sit to supine: Min assist Sit to sidelying: Supervision General bed mobility comments: Min A +2 to elevate trunk, pt reaching for handheld assist Transfers Overall transfer level: Needs assistance Equipment used: Rolling walker (2 wheels) Transfers: Sit to/from Stand Sit to Stand: Min assist, +2 physical assistance, +2 safety/equipment, From elevated surface, Max assist Bed to/from chair/wheelchair/BSC transfer type:: Stand pivot Stand pivot transfers: Max assist, +2 safety/equipment, +2 physical assistance Transfer via Lift Equipment: Stedy General transfer comment: Min A + 2 for power up from edge of bed with RW. Max A +2 for correcting posterior lean once in standing. Ambulation/Gait Ambulation/Gait assistance: +2 physical assistance, Max assist Gait Distance (Feet): 3 Feet Assistive device: Rolling walker (2 wheels) Gait Pattern/deviations: Step-to pattern, Decreased weight shift to right, Decreased dorsiflexion - right, Knee hyperextension - right General Gait Details: Max multimodal cueing for sequencing, technique, use of arms, weight shifting anteriorly, and glute activation. Pt with retropulsion and right knee hyperextension. close chair follow Gait velocity: decreased Pre-gait activities: Stood with walker x 30 sec with mod assist working on upright posture and hip extension    ADL: ADL Overall ADL's : Needs assistance/impaired Eating/Feeding: Set up, Supervision/ safety,  Sitting Eating/Feeding Details (indicate cue type and reason): Set up for breakfast while sitting in recliner. Grooming: Minimal assistance, Sitting, Wash/dry hands Grooming Details (indicate cue type and reason): Min A for reaching soap and facilitating weight shift forward and reach. Pt demonstrating decreased coorindation and FM skills during oral care; dropping grooming items and difficulty with targeted reach. Upper Body Bathing: Maximal assistance, Bed level Lower Body Bathing: Total assistance, Bed level Upper Body Dressing : Maximal assistance, Bed level Lower Body Dressing: Bed level, Maximal assistance Lower Body Dressing Details (indicate cue type and reason): don socks Toilet Transfer: Maximal assistance, +2 for physical assistance, Ambulation, Rolling walker (2 wheels) (simulated to recliner) Toilet Transfer Details (indicate cue type and reason): Max A +2 for decreasing posterior lean and maintaining balance. buckling of R knee in standing Toileting- Clothing Manipulation and Hygiene: Total assistance Functional mobility during ADLs: Maximal assistance, +2 for physical assistance (3 ft) General ADL Comments: Pt performing sit<>Stand and then short distance forward gait with Max A +2. Pt then completing grooming tasks at sink with MIn A. demonstrating increased activity tolerance and engagement   Cognition: Cognition Overall Cognitive Status: Impaired/Different from baseline Orientation Level: Oriented X4 Cognition Arousal/Alertness: Awake/alert Behavior During Therapy: WFL for tasks assessed/performed, Flat affect (More engaged. Slightly flat) Overall Cognitive Status: Impaired/Different from baseline Area of Impairment: Attention, Following commands, Safety/judgement, Awareness, Problem solving Current Attention Level: Selective Following Commands: Follows one step commands  with increased time Safety/Judgement: Decreased awareness of deficits Awareness: Emergent Problem Solving: Slow processing, Decreased initiation, Requires verbal cues, Difficulty sequencing, Requires tactile cues General Comments: Continues to present with decreased problem solving and requiring increased time and cues. More engaged this session. Willing to follow commands and participate in therapy. Smiling and thankful for therapy   Physical Exam: Blood pressure (!) 147/90, pulse 97, temperature 98.6 F (37 C), temperature source Oral, resp. rate 16, height 5\' 5"  (1.651 m), weight 79.4 kg, SpO2 93 %. Physical Exam Vitals and nursing note reviewed.  Constitutional:      Comments: Frail appearing pt- sitting up in bedside chair; NAD- c/oo chest pain from CPR- soreness as compared to "pain". On monitor;   HENT:     Head: Normocephalic and atraumatic.     Right Ear: External ear normal.     Left Ear: External ear normal.     Nose: Nose normal. No congestion.     Mouth/Throat:     Mouth: Mucous membranes are dry.     Pharynx: Oropharynx is clear. No oropharyngeal exudate.  Eyes:     General:        Right eye: No discharge.        Left eye: No discharge.     Extraocular Movements: Extraocular movements intact.  Cardiovascular:     Rate and Rhythm: Normal rate and regular rhythm.     Heart sounds: Normal heart sounds. No murmur heard.   No gallop.  Pulmonary:     Comments: CTA B/L- decreased at bases B/L- -  Abdominal:     Comments: Normoactive BS; soft, NT; ND  Genitourinary:    Comments: Foley tube in place- medium amber urine in bag Musculoskeletal:     Cervical back: Neck supple. No tenderness.     Comments: UE 5-/5 B/L  LE 3/5 HF/KE- DF 4-/5 on L and 2-/5 on R; PF 4-/5 B/L  R foot drop- at rest in PF   Skin:    General: Skin is warm and dry.  Comments: R chest HD catheter  Neurological:     Comments: Patient is a bit lethargic.  Makes eye contact with examiner.   Follows simple commands.  Limited medical historian. Mild tremors with movement Numb/absent/decreased sensation from mid calf to toes B/L L>R Vague- Ox2 with cues  Psychiatric:     Comments: Pleasant, appropriate      Lab Results Last 48 Hours        Results for orders placed or performed during the hospital encounter of 10/28/21 (from the past 48 hour(s))  Glucose, capillary     Status: Abnormal    Collection Time: 12/02/21  4:54 PM  Result Value Ref Range    Glucose-Capillary 101 (H) 70 - 99 mg/dL      Comment: Glucose reference range applies only to samples taken after fasting for at least 8 hours.  Glucose, capillary     Status: Abnormal    Collection Time: 12/02/21  8:07 PM  Result Value Ref Range    Glucose-Capillary 164 (H) 70 - 99 mg/dL      Comment: Glucose reference range applies only to samples taken after fasting for at least 8 hours.  Glucose, capillary     Status: Abnormal    Collection Time: 12/03/21 12:03 AM  Result Value Ref Range    Glucose-Capillary 111 (H) 70 - 99 mg/dL      Comment: Glucose reference range applies only to samples taken after fasting for at least 8 hours.  Glucose, capillary     Status: Abnormal    Collection Time: 12/03/21  4:14 AM  Result Value Ref Range    Glucose-Capillary 65 (L) 70 - 99 mg/dL      Comment: Glucose reference range applies only to samples taken after fasting for at least 8 hours.  Glucose, capillary     Status: None    Collection Time: 12/03/21  4:39 AM  Result Value Ref Range    Glucose-Capillary 74 70 - 99 mg/dL      Comment: Glucose reference range applies only to samples taken after fasting for at least 8 hours.    Comment 1 Notify RN      Comment 2 Document in Chart    Renal function panel (daily at 0500)     Status: Abnormal    Collection Time: 12/03/21  7:13 AM  Result Value Ref Range    Sodium 134 (L) 135 - 145 mmol/L    Potassium 5.3 (H) 3.5 - 5.1 mmol/L    Chloride 95 (L) 98 - 111 mmol/L    CO2 27 22 - 32  mmol/L    Glucose, Bld 98 70 - 99 mg/dL      Comment: Glucose reference range applies only to samples taken after fasting for at least 8 hours.    BUN 45 (H) 8 - 23 mg/dL    Creatinine, Ser 4.45 (H) 0.44 - 1.00 mg/dL      Comment: REPEATED TO VERIFY    Calcium 8.8 (L) 8.9 - 10.3 mg/dL    Phosphorus 4.3 2.5 - 4.6 mg/dL    Albumin 2.5 (L) 3.5 - 5.0 g/dL    GFR, Estimated 10 (L) >60 mL/min      Comment: (NOTE) Calculated using the CKD-EPI Creatinine Equation (2021)      Anion gap 12 5 - 15      Comment: Performed at [facility] [address] [place], [place] [address]  Magnesium     Status: None  Collection Time: 12/03/21  7:13 AM  Result Value Ref Range    Magnesium 2.0 1.7 - 2.4 mg/dL      Comment: Performed at [facility], [place] [address] [place], [place] [address]  CBC with Differential/Platelet     Status: Abnormal    Collection Time: 12/03/21  7:13 AM  Result Value Ref Range    WBC 9.7 4.0 - 10.5 K/uL    RBC 2.55 (L) 3.87 - 5.11 MIL/uL    Hemoglobin 7.3 (L) 12.0 - 15.0 g/dL    HCT 23.0 (L) 36.0 - 46.0 %    MCV 90.2 80.0 - 100.0 fL    MCH 28.6 26.0 - 34.0 pg    MCHC 31.7 30.0 - 36.0 g/dL    RDW 15.9 (H) 11.5 - 15.5 %    Platelets 78 (L) 150 - 400 K/uL      Comment: Immature Platelet Fraction may be clinically indicated, consider ordering this additional test [id number] CONSISTENT WITH PREVIOUS RESULT REPEATED TO VERIFY      nRBC 0.0 0.0 - 0.2 %    Neutrophils Relative % 72 %    Neutro Abs 7.1 1.7 - 7.7 K/uL    Lymphocytes Relative 13 %    Lymphs Abs 1.2 0.7 - 4.0 K/uL    Monocytes Relative 11 %    Monocytes Absolute 1.1 (H) 0.1 - 1.0 K/uL    Eosinophils Relative 2 %    Eosinophils Absolute 0.2 0.0 - 0.5 K/uL    Basophils Relative 1 %    Basophils Absolute 0.1 0.0 - 0.1 K/uL    Immature Granulocytes 1 %    Abs Immature Granulocytes 0.06 0.00 - 0.07 K/uL      Comment: Performed at [facility], [place] [address], [place], [place] [address]   Glucose, capillary     Status: None    Collection Time: 12/03/21  8:28 AM  Result Value Ref Range    Glucose-Capillary 94 70 - 99 mg/dL      Comment: Glucose reference range applies only to samples taken after fasting for at least 8 hours.  Glucose, capillary     Status: Abnormal    Collection Time: 12/03/21 11:48 AM  Result Value Ref Range    Glucose-Capillary 145 (H) 70 - 99 mg/dL      Comment: Glucose reference range applies only to samples taken after fasting for at least 8 hours.  Glucose, capillary     Status: Abnormal    Collection Time: 12/03/21  4:49 PM  Result Value Ref Range    Glucose-Capillary 62 (L) 70 - 99 mg/dL      Comment: Glucose reference range applies only to samples taken after fasting for at least 8 hours.  Glucose, capillary     Status: Abnormal    Collection Time: 12/03/21  5:33 PM  Result Value Ref Range    Glucose-Capillary 59 (L) 70 - 99 mg/dL      Comment: Glucose reference range applies only to samples taken after fasting for at least 8 hours.  Glucose, capillary     Status: None    Collection Time: 12/03/21  6:12 PM  Result Value Ref Range    Glucose-Capillary 81 70 - 99 mg/dL      Comment: Glucose reference range applies only to samples taken after fasting for at least 8 hours.  Glucose, capillary     Status: Abnormal    Collection Time: 12/03/21  8:14 PM  Result Value Ref  Range    Glucose-Capillary 104 (H) 70 - 99 mg/dL      Comment: Glucose reference range applies only to samples taken after fasting for at least 8 hours.  Glucose, capillary     Status: Abnormal    Collection Time: 12/04/21 12:00 AM  Result Value Ref Range    Glucose-Capillary 162 (H) 70 - 99 mg/dL      Comment: Glucose reference range applies only to samples taken after fasting for at least 8 hours.  Magnesium     Status: None    Collection Time: 12/04/21  1:05 AM  Result Value Ref Range    Magnesium 2.0 1.7 - 2.4 mg/dL      Comment: Performed at [facility], [place] [address] [place], [place] [address]  CBC with Differential/Platelet     Status: Abnormal    Collection Time: 12/04/21  1:05 AM  Result Value Ref Range    WBC 9.4 4.0 - 10.5 K/uL    RBC 2.66 (L) 3.87 - 5.11 MIL/uL    Hemoglobin 7.8 (L) 12.0 - 15.0 g/dL    HCT 23.6 (L) 36.0 - 46.0 %    MCV 88.7 80.0 - 100.0 fL    MCH 29.3 26.0 - 34.0 pg    MCHC 33.1 30.0 - 36.0 g/dL    RDW 15.8 (H) 11.5 - 15.5 %    Platelets 91 (L) 150 - 400 K/uL      Comment: Immature Platelet Fraction may be clinically indicated, consider ordering this additional test [id number] CONSISTENT WITH PREVIOUS RESULT REPEATED TO VERIFY      nRBC 0.0 0.0 - 0.2 %    Neutrophils Relative % 71 %    Neutro Abs 6.7 1.7 - 7.7 K/uL    Lymphocytes Relative 15 %    Lymphs Abs 1.4 0.7 - 4.0 K/uL    Monocytes Relative 11 %    Monocytes Absolute 1.0 0.1 - 1.0 K/uL    Eosinophils Relative 2 %    Eosinophils Absolute 0.2 0.0 - 0.5 K/uL    Basophils Relative 1 %    Basophils Absolute 0.1 0.0 - 0.1 K/uL    Immature Granulocytes 0 %    Abs Immature Granulocytes 0.04 0.00 - 0.07 K/uL      Comment: Performed at [facility], [place] [address] [place], [place] [address]  Comprehensive metabolic panel     Status: Abnormal    Collection Time: 12/04/21  1:05 AM  Result Value Ref Range    Sodium 134 (L) 135 - 145 mmol/L    Potassium 4.6 3.5 - 5.1 mmol/L    Chloride 97 (L) 98 - 111 mmol/L    CO2 28 22 - 32 mmol/L    Glucose, Bld 129 (H) 70 - 99 mg/dL      Comment: Glucose reference range applies only to samples taken after fasting for at least 8 hours.    BUN 53 (H) 8 - 23 mg/dL    Creatinine, Ser 4.97 (H) 0.44 - 1.00 mg/dL    Calcium 8.8 (L) 8.9 - 10.3 mg/dL    Total Protein 6.5 6.5 - 8.1 g/dL    Albumin 2.8 (L) 3.5 - 5.0 g/dL    AST 34 15 - 41 U/L    ALT 44 0 - 44 U/L    Alkaline Phosphatase 152 (H) 38 - 126 U/L    Total Bilirubin 0.6 0.3 - 1.2 mg/dL    GFR, Estimated 9 (L) >60 mL/min  Comment:  (NOTE) Calculated using the CKD-EPI Creatinine Equation (2021)      Anion gap 9 5 - 15      Comment: Performed at [facility], [place] [address], [place], [place] [address]  Glucose, capillary     Status: None    Collection Time: 12/04/21  4:11 AM  Result Value Ref Range    Glucose-Capillary 72 70 - 99 mg/dL      Comment: Glucose reference range applies only to samples taken after fasting for at least 8 hours.  Glucose, capillary     Status: None    Collection Time: 12/04/21  6:03 AM  Result Value Ref Range    Glucose-Capillary 93 70 - 99 mg/dL      Comment: Glucose reference range applies only to samples taken after fasting for at least 8 hours.  Glucose, capillary     Status: None    Collection Time: 12/04/21  9:02 AM  Result Value Ref Range    Glucose-Capillary 83 70 - 99 mg/dL      Comment: Glucose reference range applies only to samples taken after fasting for at least 8 hours.  Glucose, capillary     Status: Abnormal    Collection Time: 12/04/21 11:12 AM  Result Value Ref Range    Glucose-Capillary 119 (H) 70 - 99 mg/dL      Comment: Glucose reference range applies only to samples taken after fasting for at least 8 hours.       Imaging Results (Last 48 hours)  DG Abd 1 View   Result Date: 12/02/2021 CLINICAL DATA:  Nausea and vomiting EXAM: ABDOMEN - 1 VIEW COMPARISON:  11/16/2021 FINDINGS: Feeding tube tip in the right upper quadrant consistent with location in the distal stomach or possibly proximal duodenum. Bowel gas pattern is normal with scattered gas and stool throughout the colon. No small or large bowel distention. No radiopaque stones. Degenerative changes in the spine. IMPRESSION: Normal nonobstructive bowel gas pattern. Electronically Signed   By: Lucienne Capers M.D.   On: 12/02/2021 20:25           Blood pressure (!) 147/90, pulse 97, temperature 98.6 F (37 C), temperature source Oral, resp. rate 16, height 5\' 5"  (1.651 m), weight 79.4 kg,  SpO2 93 %.   Medical Problem List and Plan: 1. Functional deficits secondary to debility related to VF cardiac arrest secondary to massive pulmonary emboli status post mechanical thrombectomy             -patient may not shower due to HD catheter- unless it's removed             -ELOS/Goals: 10-14 days supervision to min A for PT/OT SLP 2.  Antithrombotics: -DVT/anticoagulation:  Pharmaceutical: Other (comment) Eliquis             -antiplatelet therapy: N/A 3. Pain Management: Neurontin 100 mg twice daily, oxycodone as needed 4. Mood: Cymbalta 20 mg daily, Xanax 0.25 mg daily as needed provide emotional support             -antipsychotic agents: N/A 5. Neuropsych: This patient is? capable of making decisions on her own behalf. 6. Skin/Wound Care: Routine skin checks 7. Fluids/Electrolytes/Nutrition: Routine in and outs with follow-up chemistries 8.  Retroperitoneal bleed/hemorrhagic shock/acute blood loss anemia/status post left liver lobe embolization 9.  AKI secondary to ATN.  Follow-up per renal services.  Started clip for AKI- not ESRD, however Cr almost 5 today- getting HD tomorrow 2/8.  10.  Thrombocytopenia.  Suspect ITP.  Follow-up per hematology 11.  Hyperlipidemia.  Lipitor 12.  Diabetes mellitus.  Levemir 4 units twice daily 13. Poor appetite- has Cortrak for intake due to poor appetite- Will assess if still needs     I have personally performed a face to face diagnostic evaluation of this patient and formulated the key components of the plan.  Additionally, I have personally reviewed laboratory data, imaging studies, as well as relevant notes and concur with the physician assistant's documentation above.   The patient's status has not changed from the original H&P.  Any changes in documentation from the acute care chart have been noted above.       Lavon Paganini Angiulli, PA-C 12/04/2021

## 2021-12-04 NOTE — Progress Notes (Signed)
Pt's case discussed with Dr Royce Macadamia. Will assist with out-pt HD needs if deemed necessary and once pt's d/c disposition finalized.   Melven Sartorius Renal Navigator [phone]

## 2021-12-04 NOTE — Progress Notes (Signed)
Patient arrived on unit, oriented to unit. Reviewed medications, therapy schedule, rehab routine and plan of care. States an understanding of information reviewed. No complications noted at this time. Patient reports no pain Patty Leitzke L Ece [place]  
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
## 2021-12-04 NOTE — Progress Notes (Signed)
Physical Therapy Treatment Patient Details Name: [name] MRN: [id number] DOB: [birth year] Today's Date: 12/04/2021   History of Present Illness 71 y.o. female admitted 10/28/21 after cardiac arrest requiring 20-min CPR for ROSC; ETT in the field. Pt had 2x additional codes in ED requiring CPR for 4 and 2-min. Workup for STEMI, cardiogenic shock, large saddle PE, RUQ bleeding, 2 rib fxs. S/p PE thrombectomy 1/2. Pt with intraperitoneal hemorrhage s/p embolization 1/5. ETT 1/1-1/10. S/p L femoral non-tunneled HD catheter placed 1/4. CRRT 1/4-1/11; trialled iHD 1/12; CRRT again 1/15-1/18. Began iHD again 1/20. S/p RIJ tunneled HD catheter placed 1/24. Pt with persistent thrombocytopenia; plan for bone marrow biopsy 1/27. PMH includes fibromyalgia, HTN, DM2, CKD, GERD, asthma.    PT Comments    Patient progressing slowly towards PT goals. Requires Max A of 2 to stand today due to heavy posterior lean and difficulty correcting foot placement/postural alignment despite verbal and tactile cues. Limited standing tolerance due to pain in heel. Pt anxious about mobility and appears fearful of falling. Requires Max A of 2 for SPT to chair. Demonstrates cognitive deficits related to problem solving, awareness, attention and memory. Pleasant and motivated to get OOB. Would benefit from post acute rehab. Will follow.    Recommendations for follow up therapy are one component of a multi-disciplinary discharge planning process, led by the attending physician.  Recommendations may be updated based on patient status, additional functional criteria and insurance authorization.  Follow Up Recommendations  Acute inpatient rehab (3hours/day)     Assistance Recommended at Discharge Frequent or constant Supervision/Assistance  Patient can return home with the following Two people to help with walking and/or transfers;Assistance with cooking/housework;Assist for transportation;Help with stairs or ramp for entrance;A  lot of help with bathing/dressing/bathroom   Equipment Recommendations  Rolling walker (2 wheels);Wheelchair (measurements PT);Wheelchair cushion (measurements PT);Hospital bed    Recommendations for Other Services       Precautions / Restrictions Precautions Precautions: Fall;Other (comment) Precaution Comments: cortrak Restrictions Weight Bearing Restrictions: No     Mobility  Bed Mobility Overal bed mobility: Needs Assistance Bed Mobility: Supine to Sit     Supine to sit: Mod assist, HOB elevated     General bed mobility comments: Mod A for trunk and to scoot bottom to EOB, use of rails.    Transfers Overall transfer level: Needs assistance Equipment used: Rolling walker (2 wheels), None Transfers: Sit to/from Stand, Bed to chair/wheelchair/BSC Sit to Stand: Max assist, +2 physical assistance, Mod assist Stand pivot transfers: Max assist, +2 physical assistance, +2 safety/equipment         General transfer comment: Mod-Max A of 2 to power to standing due to heavy posterior lean, cues needed for hand placement as pt pulling up on RW. Stood from EOB x2, SPT to chair towards right with Max A of 2. Unable to advance feet today due to pain in heel and poor balance.    Ambulation/Gait               General Gait Details: Unable due to pain, posterior lean, poor balance   Stairs             Wheelchair Mobility    Modified Rankin (Stroke Patients Only)       Balance Overall balance assessment: Needs assistance Sitting-balance support: Feet supported, Bilateral upper extremity supported Sitting balance-Leahy Scale: Poor Sitting balance - Comments: Leaning posteriorly holdong onto RW picking RW up off floor, despite cues for anterior weight shift/lean, pt unable  to correct today.   Standing balance support: During functional activity, Reliant on assistive device for balance, Bilateral upper extremity supported Standing balance-Leahy Scale:  Zero Standing balance comment: Max A of 2 for static standing due to heavy posterior lean. Difficulty getting Com under BoS.                            Cognition Arousal/Alertness: Awake/alert Behavior During Therapy: Flat affect Overall Cognitive Status: Impaired/Different from baseline Area of Impairment: Attention, Memory, Following commands, Problem solving, Awareness                   Current Attention Level: Selective   Following Commands: Follows one step commands with increased time Safety/Judgement: Decreased awareness of deficits Awareness: Emergent Problem Solving: Slow processing, Decreased initiation, Requires verbal cues, Difficulty sequencing, Requires tactile cues General Comments: Continues to present with decreased problem solving requiring increased time/cues/repetition to follow commands. Seems anxious with mobility and fearful of falling pushing compulsively posterior throughout session.        Exercises      General Comments        Pertinent Vitals/Pain Pain Assessment Pain Assessment: Faces Faces Pain Scale: Hurts little more Pain Location: left heel Pain Descriptors / Indicators: Sore, Tender, Discomfort Pain Intervention(s): Monitored during session, Repositioned, Limited activity within patient's tolerance    Home Living                          Prior Function            PT Goals (current goals can now be found in the care plan section) Progress towards PT goals: Progressing toward goals (slowly)    Frequency    Min 3X/week      PT Plan Current plan remains appropriate    Co-evaluation              AM-PAC PT "6 Clicks" Mobility   Outcome Measure  Help needed turning from your back to your side while in a flat bed without using bedrails?: A Little Help needed moving from lying on your back to sitting on the side of a flat bed without using bedrails?: A Lot Help needed moving to and from a bed to  a chair (including a wheelchair)?: A Lot Help needed standing up from a chair using your arms (e.g., wheelchair or bedside chair)?: A Lot Help needed to walk in hospital room?: Total Help needed climbing 3-5 steps with a railing? : Total 6 Click Score: 11    End of Session Equipment Utilized During Treatment: Gait belt Activity Tolerance: Patient tolerated treatment well Patient left: in chair;with call bell/phone within reach;with chair alarm set Nurse Communication: Mobility status;Need for lift equipment (stedy) PT Visit Diagnosis: Unsteadiness on feet (R26.81);Other abnormalities of gait and mobility (R26.89);Muscle weakness (generalized) (M62.81);Difficulty in walking, not elsewhere classified (R26.2)     Time: 3335-4562 PT Time Calculation (min) (ACUTE ONLY): 28 min  Charges:  $Therapeutic Activity: 23-37 mins                     Marisa Severin, PT, DPT Acute Rehabilitation Services Pager [phone] Office [place] 12/04/2021, 3:42 PM

## 2021-12-04 NOTE — Progress Notes (Signed)
Nutrition Follow-up  DOCUMENTATION CODES:   Severe malnutrition in context of acute illness/injury  INTERVENTION:   Continue Nepro Shake po TID, each supplement provides 425 kcal and 19 grams protein.  Continue Vital Cuisine Shake TID with meals, each supplement provides 520 kcal and 22 grams of protein.  Continue Mighty Shake TID with meals, each supplement provides 330 kcals and 9 grams of protein.  Encourage intake of meals and supplements.   D/C Calorie count.   NUTRITION DIAGNOSIS:   Severe Malnutrition related to acute illness as evidenced by moderate muscle depletion, energy intake < or equal to 50% for > or equal to 5 days, mild fat depletion.  Ongoing   GOAL:   Patient will meet greater than or equal to 90% of their needs  Progressing  MONITOR:   Diet advancement, Labs, Weight trends, Other (Comment)  REASON FOR ASSESSMENT:   Consult Calorie Count  ASSESSMENT:   71 yo female admitted with acute encephalopathy post OOH prolonged V.fib arrest with multiorgan failure with massive saddle PE and DIC, AKI. PMH includes DM, HTN, HLD, GERD, anxiety, depression, fibromyalgia  No documentation for calorie count available. Per discussion with RN and nutrition services ambassador, patient is eating poorly. She eats a few bites at meal times. Per family, patient is not a big eater at home. They believe her intake is improving. Meal intakes documented as 25% of breakfast and lunch yesterday, 50% of dinner yesterday, and 40% of breakfast today, which is better than over the weekend. Daughter is bringing in some meals for patient.   Nocturnal TF is still off, but Cortrak tube remains in place. MD to discuss with patient and family whether or not to resume TF via Cortrak or remove Cortrak and hope intake improves.   Labs reviewed. Na 134, K 5.3 CBG: 94-145  Medications reviewed and include B-complex with vitamin C, folic acid, Novolog, Levemir, Protonix,  Carafate.  Admission weight 77.1 kg Current weight 79.4 kg  Last HD 2/4 with no UF. IVF added today: NS at 75 ml/h x 12 hours  Diet Order:   Diet Order             DIET DYS 3 Room service appropriate? Yes with Assist; Fluid consistency: Thin  Diet effective now                   EDUCATION NEEDS:   Not appropriate for education at this time  Skin:  Skin Assessment: Skin Integrity Issues: Skin Integrity Issues:: Stage II, Other (Comment) Stage II: Left sternum Other: MASD to buttocks; skin tear to buttocks and R elbow  Last BM:  2/7 type 6  Height:   Ht Readings from Last 1 Encounters:  10/28/21 5\' 5"  (1.651 m)    Weight:   Wt Readings from Last 1 Encounters:  12/04/21 79.4 kg    BMI:  Body mass index is 29.13 kg/m.  Estimated Nutritional Needs:   Kcal:  1950-2150 kcals  Protein:  100-125 g  Fluid:  >/= 1.8 L    Lucas Mallow RD, LDN, CNSC Please refer to Amion for contact information.

## 2021-12-04 NOTE — Progress Notes (Signed)
No changes from previous assessment. °

## 2021-12-04 NOTE — Progress Notes (Signed)
[facility] Progress Note     Assessment/ Plan:   AKI secondary to ATN in the setting of cardiac arrest, hypotension, pressors, contrast.  BL CKD3A with BL creatinine in the 1.1-1.3 range in late 2021.  Has been RRT dependent since 1/4 - this was discussed with the patient and the family on 1/29.  Because they noted she made some urine output plans for outpatient dialysis and permanent access had been delayed.  She last had dialysis on 2/1.  Urine output has been higher this past week but not well documented at times. (Post void?) Bladder scan 2/6 with zero mL - with marked improvement in uop will hold HD today.  Anticipate likely HD tomorrow 2/8 as she had continued to have slight rise in Cr - strict ins/outs re-ordered - We have started CLIP for AKI.  Note started CRRT (on 1/4).  Watching intradialytic creatinine trends for now - please avoid sucralafate and maalox given her renal failure - appreciate reduction in gabapentin  - she has a foley  - NS 75 ml/hr x 12 hours    VF cardiac arrest--> OOH VF arrest 2/2 massive PE  Massive PE s/p mechanical thrombectomy 1/2 of R and L PA.  Vasc US- no DVT, liver mass noted 10/28/21 CT abd/ pelvis, s/p MRI abd--> incompletely characterized, had MRI with gadolinium 1/20  to reevaluate -- not definitive.  Oncology following  Retroperitoneal bleed/Hemorrhagic shock/Acute blood loss anemia originating from left liver lobe s/p embolization 1/4--> required FFP, plts, Vit K, pRBCs; now stable. Hematology following - no ESA due to concern for her recent thromboembolic disease. 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 -  work up and mgmt per heme.  Overall improved  Acute hypoxic RF- extubated 11/07/21, now much improved  Sepsis/ leukocytosis/ aspiration pneumonia- MRI with aspiration PNA.  S/p  CTX and vanc   Metabolic bone disease - hyperphos resolved with HD-   PTH 129  10. Liver lesions - per  charting possible biopsy once patient is more stable  11. PSA uti: present on culture. Pan sensitive. Treatment per primary  Disposition - continue inpatient monitoring    Subjective:    she had 2 liters UOP charted over 2/6.    Last HD on 2/4 with keep even.  She feels ok this am.  Glad to hear about holding HD today though understands may just be pushing back a day   Review of systems: Denies shortness of breath or chest pain  Denies n/v   Objective:   BP (!) 159/89 (BP Location: Left Arm)    Pulse 96    Temp 98.1 F (36.7 C) (Oral)    Resp 13    Ht 5\' 5"  (1.651 m)    Wt 79.4 kg    SpO2 96%    BMI 29.13 kg/m   Intake/Output Summary (Last 24 hours) at 12/04/2021 0901 Last data filed at 12/04/2021 4496 Gross per 24 hour  Intake 720 ml  Output 2625 ml  Net -1905 ml   Weight change:   Physical Exam:   PRF:FMBWGYK female in bed in NAD HEENT:  NCAT LUNGS: clear to auscultation and normal work of breathing at rest CV: S1S2 no rub ABD: soft, nontender/nt EXT: Warm and well perfused, trace to no edema Psych normal mood and affect Neuro awake and conversant, follows commands Gu - foley catheter in place Acces: RIJ tunn dialysis catheter  Imaging: DG Abd 1 View  Result Date:  12/02/2021 CLINICAL DATA:  Nausea and vomiting EXAM: ABDOMEN - 1 VIEW COMPARISON:  11/16/2021 FINDINGS: Feeding tube tip in the right upper quadrant consistent with location in the distal stomach or possibly proximal duodenum. Bowel gas pattern is normal with scattered gas and stool throughout the colon. No small or large bowel distention. No radiopaque stones. Degenerative changes in the spine. IMPRESSION: Normal nonobstructive bowel gas pattern. Electronically Signed   By: Lucienne Capers M.D.   On: 12/02/2021 20:25    Labs: BMET Recent Labs  Lab 11/28/21 0244 11/28/21 0246 11/29/21 0548 11/30/21 0309 12/01/21 0147 12/02/21 0159 12/03/21 0713 12/04/21 0105  NA  --  129* 135 133* 133* 132* 134* 134*   K  --  4.7 4.2 4.6 5.3* 4.9 5.3* 4.6  CL  --  94* 99 97* 97* 95* 95* 97*  CO2  --  24 27 25 24 27 27 28   GLUCOSE  --  170* 163* 99 182* 290* 98 129*  BUN  --  45* 26* 39* 56* 29* 45* 53*  CREATININE  --  4.66* 2.95* 4.04* 5.02* 2.96* 4.45* 4.97*  CALCIUM  --  8.5* 8.4* 8.6* 8.7* 8.2* 8.8* 8.8*  PHOS 4.0  --  2.7 2.8 3.0 3.2 4.3  --    CBC Recent Labs  Lab 12/01/21 0147 12/02/21 0159 12/03/21 0713 12/04/21 0105  WBC 9.2 8.2 9.7 9.4  NEUTROABS  --   --  7.1 6.7  HGB 7.2* 7.4* 7.3* 7.8*  HCT 22.7* 22.9* 23.0* 23.6*  MCV 90.4 90.2 90.2 88.7  PLT 87* 61* 78* 91*    Medications:     apixaban  5 mg Oral BID   atorvastatin  20 mg Oral Daily   B-complex with vitamin C  1 tablet Oral Daily   chlorhexidine  15 mL Mouth Rinse BID   Chlorhexidine Gluconate Cloth  6 each Topical Daily   Chlorhexidine Gluconate Cloth  6 each Topical Q0600   Chlorhexidine Gluconate Cloth  6 each Topical Q0600   dicyclomine  10 mg Oral TID AC   DULoxetine  20 mg Oral Daily   feeding supplement (NEPRO CARB STEADY)  237 mL Oral TID BM   folic acid  2 mg Oral Daily   gabapentin  100 mg Oral BID   insulin aspart  0-9 Units Subcutaneous Q4H   insulin detemir  4 Units Subcutaneous BID   mouth rinse  15 mL Mouth Rinse q12n4p   mupirocin cream  1 application Topical BID   pantoprazole  40 mg Oral BID    Claudia Desanctis, MD 12/04/2021 9:24 AM

## 2021-12-05 LAB — CBC WITH DIFFERENTIAL/PLATELET
Abs Immature Granulocytes: 0.05 10*3/uL (ref 0.00–0.07)
Basophils Absolute: 0.1 10*3/uL (ref 0.0–0.1)
Basophils Relative: 1 %
Eosinophils Absolute: 0.1 10*3/uL (ref 0.0–0.5)
Eosinophils Relative: 1 %
HCT: 23.1 % — ABNORMAL LOW (ref 36.0–46.0)
Hemoglobin: 7.4 g/dL — ABNORMAL LOW (ref 12.0–15.0)
Immature Granulocytes: 1 %
Lymphocytes Relative: 14 %
Lymphs Abs: 1.3 10*3/uL (ref 0.7–4.0)
MCH: 28.5 pg (ref 26.0–34.0)
MCHC: 32 g/dL (ref 30.0–36.0)
MCV: 88.8 fL (ref 80.0–100.0)
Monocytes Absolute: 1.1 10*3/uL — ABNORMAL HIGH (ref 0.1–1.0)
Monocytes Relative: 11 %
Neutro Abs: 6.9 10*3/uL (ref 1.7–7.7)
Neutrophils Relative %: 72 %
Platelets: 105 10*3/uL — ABNORMAL LOW (ref 150–400)
RBC: 2.6 MIL/uL — ABNORMAL LOW (ref 3.87–5.11)
RDW: 15.4 % (ref 11.5–15.5)
WBC: 9.6 10*3/uL (ref 4.0–10.5)
nRBC: 0 % (ref 0.0–0.2)

## 2021-12-05 LAB — COMPREHENSIVE METABOLIC PANEL
ALT: 32 U/L (ref 0–44)
AST: 24 U/L (ref 15–41)
Albumin: 2.6 g/dL — ABNORMAL LOW (ref 3.5–5.0)
Alkaline Phosphatase: 136 U/L — ABNORMAL HIGH (ref 38–126)
Anion gap: 13 (ref 5–15)
BUN: 51 mg/dL — ABNORMAL HIGH (ref 8–23)
CO2: 24 mmol/L (ref 22–32)
Calcium: 8.6 mg/dL — ABNORMAL LOW (ref 8.9–10.3)
Chloride: 99 mmol/L (ref 98–111)
Creatinine, Ser: 5.74 mg/dL — ABNORMAL HIGH (ref 0.44–1.00)
GFR, Estimated: 7 mL/min — ABNORMAL LOW (ref >60)
Glucose, Bld: 56 mg/dL — ABNORMAL LOW (ref 70–99)
Potassium: 4.4 mmol/L (ref 3.5–5.1)
Sodium: 136 mmol/L (ref 135–145)
Total Bilirubin: 0.8 mg/dL (ref 0.3–1.2)
Total Protein: 6.3 g/dL — ABNORMAL LOW (ref 6.5–8.1)

## 2021-12-05 LAB — GLUCOSE, CAPILLARY
Glucose-Capillary: 54 mg/dL — ABNORMAL LOW (ref 70–99)
Glucose-Capillary: 56 mg/dL — ABNORMAL LOW (ref 70–99)
Glucose-Capillary: 63 mg/dL — ABNORMAL LOW (ref 70–99)
Glucose-Capillary: 82 mg/dL (ref 70–99)
Glucose-Capillary: 83 mg/dL (ref 70–99)
Glucose-Capillary: 92 mg/dL (ref 70–99)
Glucose-Capillary: 94 mg/dL (ref 70–99)

## 2021-12-05 MED ORDER — HEPARIN SODIUM (PORCINE) 1000 UNIT/ML IJ SOLN
INTRAMUSCULAR | Status: AC
  Filled 2021-12-05: qty 2

## 2021-12-05 MED ORDER — CHLORHEXIDINE GLUCONATE CLOTH 2 % EX PADS
6.0000 | MEDICATED_PAD | Freq: Every day | CUTANEOUS | Status: DC
  Administered 2021-12-06: 6 via TOPICAL

## 2021-12-05 NOTE — Progress Notes (Signed)
Nutrition Follow-up  DOCUMENTATION CODES:   Not applicable  INTERVENTION:  48 hour calorie count initiated.   Continue Nepro Shake po TID, each supplement provides 425 kcal and 19 grams protein.  Encourage adequate PO intake.   NUTRITION DIAGNOSIS:   Increased nutrient needs related to chronic illness (renal failure on HD) as evidenced by estimated needs.  GOAL:   Patient will meet greater than or equal to 90% of their needs  MONITOR:   PO intake, Supplement acceptance, Labs, Weight trends, Skin, I & O's, Diet advancement  REASON FOR ASSESSMENT:   Consult Calorie Count  ASSESSMENT:   71 year old female with history of asthma, diabetes mellitus, fibromyalgia, hypertension, hyperlipidemia. Presents after loss of consciousness. Pt with functional deficits secondary to debility related to VF cardiac arrest secondary to massive pulmonary emboli status post mechanical thrombectomy. Pt with renal failure secondary to ATN and hemodialysis initiated and awaiting plan for long-term hemodialysis. Admitted to CIR. Cortrak placed 1/20. Tip of tube in stomach.  Pt is currently on a dysphagia 3 diet with thin liquids. Meal completion has been poor at 0-25%. Cortrak still in place. Plans for calorie count to assess if tube feeds need to be restarted. RD to follow up tomorrow for day 1 calorie count results. Pt currently has Nepro shake ordered and has been consuming them. RD to continue with current orders. Pt undergoing HD today.   NUTRITION - FOCUSED PHYSICAL EXAM:  Flowsheet Row Most Recent Value  Orbital Region No depletion  Upper Arm Region No depletion  Thoracic and Lumbar Region No depletion  Buccal Region No depletion  Temple Region Moderate depletion  Clavicle Bone Region Mild depletion  Clavicle and Acromion Bone Region Mild depletion  Scapular Bone Region Unable to assess  Dorsal Hand No depletion  Patellar Region Unable to assess  Anterior Thigh Region Moderate depletion   Posterior Calf Region Unable to assess  Edema (RD Assessment) Mild  Hair Reviewed  Eyes Reviewed  Mouth Reviewed  Skin Reviewed  Nails Reviewed      Labs and medications reviewed.   Diet Order:   Diet Order             DIET DYS 3 Room service appropriate? Yes with Assist; Fluid consistency: Thin  Diet effective now                   EDUCATION NEEDS:   Not appropriate for education at this time  Skin:  Skin Assessment: Skin Integrity Issues: Skin Integrity Issues:: Stage II, Other (Comment) Stage II: L sternum Other: MASD to buttocks, skin tear to buttock and R elbow  Last BM:  2/7  Height:   Ht Readings from Last 1 Encounters:  12/04/21 5\' 5"  (1.651 m)    Weight:   Wt Readings from Last 1 Encounters:  12/05/21 74.7 kg    BMI:  Body mass index is 27.4 kg/m.  Estimated Nutritional Needs:   Kcal:  1950-2150  Protein:  100-125 grams  Fluid:  1L + UOP  Corrin Parker, MS, RD, LDN RD pager number/after hours weekend pager number on Amion.

## 2021-12-05 NOTE — Progress Notes (Signed)
Inpatient Rehabilitation Care Coordinator Assessment and Plan Patient Details  Name: [name] MRN: [id number] Date of Birth: [birth year]  Today's Date: 12/05/2021  Hospital Problems: Principal Problem:   Debility Active Problems:   Cardiac arrest [facility])   Acute saddle pulmonary embolism with acute cor pulmonale (HCC)   Acute on chronic renal failure (HCC)   Anoxia of brain [facility])  Past Medical History:  Past Medical History:  Diagnosis Date   Anxiety    Arthritis    Asthma    Cardiac arrest ([place]) 10/28/2021   Cardiac arrest ([place]) 10/28/2021   Depression    Diabetes mellitus without complication (HCC)    Fibromyalgia    GERD (gastroesophageal reflux disease)    Hyperlipemia    Hypertension    PONV (postoperative nausea and vomiting)    Past Surgical History:  Past Surgical History:  Procedure Laterality Date   COLONOSCOPY     DILATION AND CURETTAGE OF UTERUS     IR EMBO ART  VEN HEMORR LYMPH EXTRAV  INC GUIDE ROADMAPPING  11/01/2021   IR FLUORO GUIDE CV LINE RIGHT  11/20/2021   IR THROMBECT PRIM MECH INIT ([facility]) MOD SED  10/29/2021   IR US GUIDE VASC ACCESS RIGHT  10/29/2021   IR US GUIDE VASC ACCESS RIGHT  11/01/2021   IR US GUIDE VASC ACCESS RIGHT  11/21/2021   KNEE ARTHROSCOPY  2003,2005   left   SHOULDER ARTHROSCOPY     left   TRIGGER FINGER RELEASE  08/13/2012   Procedure: RELEASE TRIGGER FINGER/A-1 PULLEY;  Surgeon: [name]., MD;  Location: [facility];  Service: Orthopedics;  Laterality: Right;  long finger   TUBAL LIGATION     Social History:  reports that she quit smoking about 11 years ago. Her smoking use included cigarettes. She has a 10.00 pack-year smoking history. She has never used smokeless tobacco. She reports that she does not drink alcohol and does not use drugs.  Family / Support Systems Marital Status: Divorced How Long?: 40 years Patient Roles: Parent Spouse/Significant Other: Divorced Children: 2 adult daughters- [name]  (#[phone]) and dtr [name] Other Supports: None reported Anticipated Caregiver: dtr [name] Ability/Limitations of Caregiver: Pt dtr works from [employer] Availability: 24/7 Family Dynamics: Pt lived along prior to admission, and transported self to/from medical appointments, running errands, etc.  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 Education: high school grad Health Literacy - How often do you need to have someone help you when you read instructions, pamphlets, or other written material from your doctor or pharmacy?: Rarely Writes: Yes Employment Status: Retired Date Retired/Disabled/Unemployed: 2009 Public relations account executive Issues: Denies Guardian/Conservator: N/A   Abuse/Neglect Abuse/Neglect Assessment Can Be Completed: Yes Physical Abuse: Denies Verbal Abuse: Denies Sexual Abuse: Denies Exploitation of patient/patient's resources: Denies Self-Neglect: Denies  Patient response to: Social Isolation - How often do you feel lonely or isolated from those around you?: Never  Emotional Status Pt's affect, behavior and adjustment status: Pt in good spirits at time of visit. Pt was in/out of sleep at time of visit, and trying to eat her lunch as well. Recent Psychosocial Issues: Denies Psychiatric History: Denies Substance Abuse History: Denies  Patient / Family Perceptions, Expectations & Goals Pt/Family understanding of illness & functional limitations: Pt and dtr have a general understanding of pt care needs Premorbid pt/family roles/activities: Independent Anticipated changes in roles/activities/participation: Assistance with ADLs/IADLS Pt/family expectations/goals: Pt  goal is to work on memory, walking, and taking care of herself.  Community Resources [employer]s: None Premorbid Home Care/DME Agencies: None Transportation available at discharge:  Dtr [name] Is the patient able to respond to transportation needs?: Yes In the past 12 months, has lack of transportation kept you from medical appointments or from getting medications?: No In the past 12 months, has lack of transportation kept you from meetings, work, or from getting things needed for daily living?: No Resource referrals recommended: Neuropsychology  Discharge Planning Living Arrangements: Spouse/significant other Support Systems: Spouse/significant other Type of Residence: Private residence Insurance underwriter Resources: Multimedia programmer (specify) ([place]; Pharmacy- [facility] on [address]) Financial Resources: Social Security Financial Screen Referred: No Living Expenses: Rent Money Management: Patient Does the patient have any problems obtaining your medications?: No Home Management: Pt managed all home care needs Patient/Family Preliminary Plans: TBD Care Coordinator Barriers to Discharge: Decreased caregiver support, Lack of/limited family support Care Coordinator Anticipated Follow Up Needs: HH/OP  Clinical Impression SW met with pt and pt dtr [name] in room to introduce self, explain role, and discuss discharge process. Pt dtr [name] was working at the time. 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 No HCPOA forms in place but would designate her dtr [name]. DME: cane. Pt dtr [name] indicated she will follow-up with dtr at a later time due to work. SW will confirms d/c plan.   Merlon Alcorta A Daxson Reffett 12/05/2021, 1:42 PM

## 2021-12-05 NOTE — Evaluation (Signed)
Physical Therapy Assessment and Plan  Patient Details  Name: [name] MRN: [id number] Date of Birth: 03-15-1951  PT Diagnosis: Coordination disorder, Difficulty walking, Dizziness and giddiness, Impaired cognition, Impaired sensation, and Muscle weakness Rehab Potential: Good ELOS: 18-21 days   Today's Date: 12/05/2021 PT Individual Time: 0900-1016 PT Individual Time Calculation (min): 53 min    Hospital Problem: Principal Problem:   Debility Active Problems:   Cardiac arrest ([place])   Acute saddle pulmonary embolism with acute cor pulmonale (HCC)   Acute on chronic renal failure (HCC)   Anoxia of brain ([place])   Past Medical History:  Past Medical History:  Diagnosis Date   Anxiety    Arthritis    Asthma    Cardiac arrest ([place]) 10/28/2021   Cardiac arrest ([place]) 10/28/2021   Depression    Diabetes mellitus without complication (HCC)    Fibromyalgia    GERD (gastroesophageal reflux disease)    Hyperlipemia    Hypertension    PONV (postoperative nausea and vomiting)    Past Surgical History:  Past Surgical History:  Procedure Laterality Date   COLONOSCOPY     DILATION AND CURETTAGE OF UTERUS     IR EMBO ART  VEN HEMORR LYMPH EXTRAV  INC GUIDE ROADMAPPING  11/01/2021   IR FLUORO GUIDE CV LINE RIGHT  11/20/2021   IR THROMBECT PRIM MECH INIT (INCLU) MOD SED  10/29/2021   IR US GUIDE VASC ACCESS RIGHT  10/29/2021   IR US GUIDE VASC ACCESS RIGHT  11/01/2021   IR US GUIDE VASC ACCESS RIGHT  11/21/2021   KNEE ARTHROSCOPY  2003,2005   left   SHOULDER ARTHROSCOPY     left   TRIGGER FINGER RELEASE  08/13/2012   Procedure: RELEASE TRIGGER FINGER/A-1 PULLEY;  Surgeon: [name]., MD;  Location: [facility];  Service: Orthopedics;  Laterality: Right;  long finger   TUBAL LIGATION      Assessment & Plan Clinical Impression: [name] 71 year old right-handed female with history of asthma, diabetes mellitus, fibromyalgia, hypertension, hyperlipidemia.  She  quit smoking 11 years ago.  Presented 10/28/2021 after a presyncopal episode at home where her daughter lowered her to the ground.  She regained some consciousness and told her daughter she felt like her blood glucose was low.  She lost consciousness right before EMS arrived.  EMS arrived she was VF and had 20 minutes of CPR.  She received shock, amiodarone and epinephrine.  She coded twice in the ED requiring intubation for airway protection.  She required frequent pushes of epinephrine for bradycardia and hypotension.  EKG junctional rhythm concerning for STEMI.  Angiogram of the chest showed saddle pulmonary embolus extending into the segmental and subsegmental levels in the left lower lobe in all 3 right lobes.  Associated Right heart strain.  Vascular ultrasound lower extremities negative for DVT.  She did require mechanical thrombectomy for pulmonary emboli.  Patient was slowly weaned from pressors.  She was extubated 11/06/2021.  On 11/08/2021 MRI abdomen showed a large area of intrahepatic hemorrhage and she did require multiple transfusions and underwent embolization 10/31/2021.  Nephrology consulted for renal failure secondary to ATN and hemodialysis initiated and awaiting plan for long-term hemodialysis.  Bouts of thrombocytopenia suspect ITP and was getting Nplate prn and Dex.  With latest hemoglobin 7.8 platelet count improved to 91,000.  She was cleared to begin Eliquis for history of pulmonary emboli.  Therapy evaluations completed due to patient decreased functional mobility was admitted for  a comprehensive rehab program.  Patient transferred to CIR on 12/04/2021 .   Patient currently requires total with mobility secondary to muscle weakness, impaired timing and sequencing, decreased coordination, and decreased motor planning, decreased initiation, decreased safety awareness, and delayed processing, and decreased sitting balance, decreased standing balance, and decreased balance strategies.  Prior to  hospitalization, patient was independent  with mobility and lived with Alone in a [place] home.  Home access is 1Stairs to enter.  Patient will benefit from skilled PT intervention to maximize safe functional mobility, minimize fall risk, and decrease caregiver burden for planned discharge home with 24 hour assist.  Anticipate patient will benefit from follow up [facility] at discharge.  PT - End of Session Activity Tolerance: Tolerates 10 - 20 min activity with multiple rests Endurance Deficit: Yes Endurance Deficit Description: exteme fatigue and debility PT Assessment Rehab Potential (ACUTE/IP ONLY): Good PT Barriers to Discharge: Home environment access/layout;Decreased caregiver support;Hemodialysis;Nutrition means PT Patient demonstrates impairments in the following area(s): Balance;Endurance;Motor;Pain;Edema;Safety;Skin Integrity PT Transfers Functional Problem(s): Bed Mobility;Bed to Chair;Car PT Locomotion Functional Problem(s): Ambulation;Wheelchair Mobility PT Plan PT Intensity: Minimum of 1-2 x/day ,45 to 90 minutes PT Frequency: 5 out of 7 days PT Duration Estimated Length of Stay: 18-21 days PT Treatment/Interventions: Ambulation/gait training;Cognitive remediation/compensation;Discharge planning;DME/adaptive equipment instruction;Functional mobility training;Pain management;Psychosocial support;Splinting/orthotics;Therapeutic Activities;UE/LE Strength taining/ROM;Visual/perceptual remediation/compensation;Balance/vestibular training;Community reintegration;Disease management/prevention;Functional electrical stimulation;Neuromuscular re-education;Patient/family education;Skin care/wound management;Stair training;Therapeutic Exercise;Wheelchair propulsion/positioning;UE/LE Coordination activities PT Transfers Anticipated Outcome(s): min A PT Locomotion Anticipated Outcome(s): min A household gait PT Recommendation Recommendations for Other Services: Speech consult Follow Up  Recommendations: Home health PT Patient destination: Home Equipment Recommended: To be determined   PT Evaluation Precautions/Restrictions Precautions Precautions: Fall;Other (comment) Precaution Comments: cortrak; right leg discomfort - PTA Restrictions Weight Bearing Restrictions: No General   Vital Signs Pain   Pt reports 6/10 pain, premedicated. Rest and positioning provided as needed.  Pain Interference Pain Interference Pain Effect on Sleep: 1. Rarely or not at all Pain Interference with Therapy Activities: 2. Occasionally Pain Interference with Day-to-Day Activities: 2. Occasionally Home Living/Prior Functioning Home Living Available Help at Discharge: Family;Available 24 hours/day Type of Home: Apartment Home Access: Stairs to enter Entrance Stairs-Number of Steps: 1 Entrance Stairs-Rails: None Home Layout: Able to live on main level with bedroom/bathroom;Multi-level;1/2 bath on main level;Bed/bath upstairs Alternate Level Stairs-Number of Steps: 6 stairs, landing, then 6 more Alternate Level Stairs-Rails: Left  Lives With: Alone Prior Function  Able to Take Stairs?: Yes Driving: Yes Vocation: Retired Vision/Perception  Vision - History Ability to See in Adequate Light: 0 Adequate Perception Perception: Impaired Praxis Praxis: Intact  Cognition Overall Cognitive Status: Impaired/Different from baseline Arousal/Alertness: Awake/alert Orientation Level: Disoriented to time;Disoriented to place;Oriented to person;Oriented to situation Year: 2023 Month: February Day of Week: Incorrect (Friday) Attention: Sustained Focused Attention: Appears intact Sustained Attention: Impaired Sustained Attention Impairment: Functional basic Immediate Memory Recall: Sock;Blue;Bed Memory Recall Sock: Not able to recall Memory Recall Blue: Without Cue Memory Recall Bed: Not able to recall Awareness: Impaired Awareness Impairment: Intellectual impairment Problem Solving:  Impaired Problem Solving Impairment: Functional basic;Verbal basic Safety/Judgment: Impaired Sensation Sensation Light Touch: Appears Intact Hot/Cold: (P) Not tested Proprioception: Impaired Detail Proprioception Impaired Details: Impaired LLE;Impaired RLE Stereognosis: (P) Not tested Additional Comments: (P) Impaired R foot and ankle, ~60% compared to L side Coordination Gross Motor Movements are Fluid and Coordinated: No Fine Motor Movements are Fluid and Coordinated: Yes Motor  Motor Motor: Other (comment) Motor - Skilled Clinical Observations: extremely deconditioned; generalized weakness and ongoing pain  in LES   Trunk/Postural Assessment  Cervical Assessment Cervical Assessment: Within Functional Limits Thoracic Assessment Thoracic Assessment:  (rounded shoulders) Lumbar Assessment Lumbar Assessment: Exceptions to [facility] (posterior pelvic tilt) Postural Control Postural Control: Deficits on evaluation Righting Reactions: delayed and inadequate Protective Responses: delyed and inadequate  Balance Balance Balance Assessed: Yes Static Sitting Balance Static Sitting - Balance Support: Bilateral upper extremity supported;Feet supported Static Sitting - Level of Assistance: 4: Min assist Static Sitting - Comment/# of Minutes: fatigues very quickly Static Standing Balance Static Standing - Balance Support: Bilateral upper extremity supported Static Standing - Level of Assistance: 1: +2 Total assist Extremity Assessment  RUE Assessment RUE Assessment: Within Functional Limits LUE Assessment LUE Assessment: Within Functional Limits RLE Assessment RLE Assessment: Exceptions to [facility] General Strength Comments: Unsure if limited by comprehension in moderately distracting environment or true strength deficit RLE Strength Right Hip Flexion: 3-/5 Right Knee Flexion: 2+/5 Right Knee Extension: 2+/5 Right Ankle Dorsiflexion: 1/5 Right Ankle Plantar Flexion: 1/5 LLE  Assessment LLE Assessment: Exceptions to [facility] General Strength Comments: Unsure if limited by comprehension in moderately distracting environment or true strength deficit LLE Strength Left Hip Flexion: 3-/5 Left Knee Flexion: 3+/5 Left Knee Extension: 3+/5 Left Ankle Dorsiflexion: 2+/5 Left Ankle Plantar Flexion: 2+/5  Care Tool Care Tool Bed Mobility Roll left and right activity   Roll left and right assist level: Moderate Assistance - Patient 50 - 74%    Sit to lying activity   Sit to lying assist level: Moderate Assistance - Patient 50 - 74%    Lying to sitting on side of bed activity   Lying to sitting on side of bed assist level: the ability to move from lying on the back to sitting on the side of the bed with no back support.: Moderate Assistance - Patient 50 - 74%     Care Tool Transfers Sit to stand transfer   Sit to stand assist level: Dependent - Patient 0%    Chair/bed transfer   Chair/bed transfer assist level: Dependent - mechanical lift (stedy)     Toilet transfer   Assist Level: 2 Production assistant, radio transfer activity did not occur: Safety/medical concerns        Care Tool Locomotion Ambulation Ambulation activity did not occur: Safety/medical concerns        Walk 10 feet activity Walk 10 feet activity did not occur: Safety/medical concerns       Walk 50 feet with 2 turns activity Walk 50 feet with 2 turns activity did not occur: Safety/medical concerns      Walk 150 feet activity Walk 150 feet activity did not occur: Safety/medical concerns      Walk 10 feet on uneven surfaces activity Walk 10 feet on uneven surfaces activity did not occur: Safety/medical concerns      Stairs Stair activity did not occur: Safety/medical concerns        Walk up/down 1 step activity Walk up/down 1 step or curb (drop down) activity did not occur: Safety/medical concerns      Walk up/down 4 steps activity Walk up/down 4 steps activity did not occur:  Safety/medical concerns      Walk up/down 12 steps activity Walk up/down 12 steps activity did not occur: Safety/medical concerns      Pick up small objects from floor Pick up small object from the floor (from standing position) activity did not occur: Safety/medical concerns      Wheelchair Is the patient using  a wheelchair?: Yes Type of Wheelchair: Manual   Wheelchair assist level: Dependent - Patient 0%    Wheel 50 feet with 2 turns activity   Assist Level: Dependent - Patient 0%  Wheel 150 feet activity   Assist Level: Dependent - Patient 0%    Refer to Care Plan for Long Term Goals  SHORT TERM GOAL WEEK 1 PT Short Term Goal 1 (Week 1): Pt will initiate standing with RW PT Short Term Goal 2 (Week 1): Pt will tolerate sitting OOB between sessions PT Short Term Goal 3 (Week 1): Pt will tolerate x 5 min therapeutic exercise  Recommendations for other services: None   Skilled Therapeutic Intervention Evaluation completed (see details above and below) with education on PT POC and goals and individual treatment initiated with focus on  initiated functional mobility. Pt with some cognitive deficits, noted processing delay at times and pt aware stating "I have trouble thinking." Pt able to perform bed mobility with mod A but demoes heavy posterior lean in sitting. Sitting balance with min A with BUE support holding onto bed rail with intense effort. Donned ted hose and shoes tot A. Pt required max A +2 to stand to stedy but once on stedy paddles could stand with min A. Pt agreeable to remaining up in w/c, handed off to OT at end of eval.    Mobility Bed Mobility Bed Mobility: Rolling Right;Supine to Sit Rolling Right: Minimal Assistance - Patient > 75% Supine to Sit: Moderate Assistance - Patient 50-74% Transfers Transfers: Sit to Stand Sit to Stand: 2 Helpers;Total Assistance - Patient < 25% Transfer (Assistive device): Other (Comment) (stedy) Transfer via Lift Equipment:  Animal nutritionist: No Gait Gait: No Stairs / Additional Locomotion Stairs: No Product manager Mobility: No   Discharge Criteria: Patient will be discharged from PT if patient refuses treatment 3 consecutive times without medical reason, if treatment goals not met, if there is a change in medical status, if patient makes no progress towards goals or if patient is discharged from hospital.  The above assessment, treatment plan, treatment alternatives and goals were discussed and mutually agreed upon: by patient  Mickel Fuchs 12/05/2021, 12:58 PM

## 2021-12-05 NOTE — Plan of Care (Signed)
°  Problem: RH Swallowing Goal: LTG Patient will consume least restrictive diet using compensatory strategies with assistance (SLP) Description: LTG:  Patient will consume least restrictive diet using compensatory strategies with assistance (SLP) Flowsheets (Taken 12/05/2021 1542) LTG: Pt Patient will consume least restrictive diet using compensatory strategies with assistance of (SLP): Modified Independent   Problem: RH Expression Communication Goal: LTG Patient will increase word finding of common (SLP) Description: LTG:  Patient will increase word finding of common objects/daily info/abstract thoughts with cues using compensatory strategies (SLP). Flowsheets (Taken 12/05/2021 1542) LTG: Patient will increase word finding of common (SLP): Modified Independent   Problem: RH Problem Solving Goal: LTG Patient will demonstrate problem solving for (SLP) Description: LTG:  Patient will demonstrate problem solving for basic/complex daily situations with cues  (SLP) Flowsheets (Taken 12/05/2021 1542) LTG: Patient will demonstrate problem solving for (SLP): Complex daily situations LTG Patient will demonstrate problem solving for: Minimal Assistance - Patient > 75%   Problem: RH Memory Goal: LTG Patient will use memory compensatory aids to (SLP) Description: LTG:  Patient will use memory compensatory aids to recall biographical/new, daily complex information with cues (SLP) Flowsheets (Taken 12/05/2021 1542) LTG: Patient will use memory compensatory aids to (SLP): Minimal Assistance - Patient > 75%   Problem: RH Attention Goal: LTG Patient will demonstrate this level of attention during functional activites (SLP) Description: LTG:  Patient will will demonstrate this level of attention during functional activites (SLP) Flowsheets (Taken 12/05/2021 1542) Patient will demonstrate during cognitive/linguistic activities the attention type of: Selective LTG: Patient will demonstrate this level of attention  during cognitive/linguistic activities with assistance of (SLP): Supervision   Problem: RH Awareness Goal: LTG: Patient will demonstrate awareness during functional activites type of (SLP) Description: LTG: Patient will demonstrate awareness during functional activites type of (SLP) Flowsheets (Taken 12/05/2021 1542) Patient will demonstrate during cognitive/linguistic activities awareness type of: Emergent LTG: Patient will demonstrate awareness during cognitive/linguistic activities with assistance of (SLP): Minimal Assistance - Patient > 75%

## 2021-12-05 NOTE — Discharge Instructions (Addendum)
Inpatient Rehab Discharge Instructions  [name] Discharge date and time: No discharge date for patient encounter.   Activities/Precautions/ Functional Status: Activity: activity as tolerated Diet: diabetic diet Wound Care: Routine skin checks Functional status:  ___ No restrictions     ___ Walk up steps independently ___ 24/7 supervision/assistance   ___ Walk up steps with assistance ___ Intermittent supervision/assistance  ___ Bathe/dress independently ___ Walk with walker     _x__ Bathe/dress with assistance ___ Walk Independently    ___ Shower independently ___ Walk with assistance    ___ Shower with assistance ___ No alcohol     ___ Return to work/school ________  COMMUNITY REFERRALS UPON DISCHARGE:    Home Health:   PT      OT      ST    SNA                [place] *Please expect follow-up within 2-3 days of discharge to schedule your home visit. If you have not received follow-up, be sure to contact the branch directly.*    Medical Equipment/Items Ordered: wheelchair, rolling walker, and 3in1 bedside commode                                                 Agency/Supplier: [employer] [phone]    Special Instructions: No driving smoking or alcohol   My questions have been answered and I understand these instructions. I will adhere to these goals and the provided educational materials after my discharge from the hospital.  Patient/Caregiver Signature _______________________________ Date __________  Clinician Signature _______________________________________ Date __________  Please bring this form and your medication list with you to all your follow-up doctor's appointments.       Information on my medicine - ELIQUIS (apixaban)  Why was Eliquis prescribed for you? Eliquis was prescribed to treat blood clots that may have been found in the veins of your legs (deep vein thrombosis) or in your lungs (pulmonary  embolism) and to reduce the risk of them occurring again.  What do You need to know about Eliquis ? The dose is ONE 5 mg tablet taken TWICE daily.  Eliquis may be taken with or without food.   Try to take the dose about the same time in the morning and in the evening. If you have difficulty swallowing the tablet whole please discuss with your pharmacist how to take the medication safely.  Take Eliquis exactly as prescribed and DO NOT stop taking Eliquis without talking to the doctor who prescribed the medication.  Stopping may increase your risk of developing a new blood clot.  Refill your prescription before you run out.  After discharge, you should have regular check-up appointments with your healthcare provider that is prescribing your Eliquis.    What do you do if you miss a dose? If a dose of ELIQUIS is not taken at the scheduled time, take it as soon as possible on the same day and twice-daily administration should be resumed. The dose should not be doubled to make up for a missed dose.  Important Safety Information A possible side effect of Eliquis is bleeding. You should call your healthcare provider right away if you experience any of the following: Bleeding from an injury or your nose that does not stop. Unusual colored urine (red or dark brown)  or unusual colored stools (red or black). Unusual bruising for unknown reasons. A serious fall or if you hit your head (even if there is no bleeding).  Some medicines may interact with Eliquis and might increase your risk of bleeding or clotting while on Eliquis. To help avoid this, consult your healthcare provider or pharmacist prior to using any new prescription or non-prescription medications, including herbals, vitamins, non-steroidal anti-inflammatory drugs (NSAIDs) and supplements.  This website has more information on Eliquis (apixaban): http://www.eliquis.com/eliquis/home

## 2021-12-05 NOTE — Progress Notes (Signed)
Inpatient Rehabilitation  Patient information reviewed and entered into eRehab system by Stephfon Bovey M. Uma Jerde, M.A., CCC/SLP, PPS Coordinator.  Information including medical coding, functional ability and quality indicators will be reviewed and updated through discharge.    

## 2021-12-05 NOTE — Evaluation (Signed)
Occupational Therapy Assessment and Plan  Patient Details  Name: [name] MRN: [id number] Date of Birth: [birth year]  OT Diagnosis: acute pain, cognitive deficits, and muscle weakness (generalized) Rehab Potential: Rehab Potential (ACUTE ONLY): Good ELOS: ~3 Weeks   Today's Date: 12/05/2021 OT Individual Time: 3903-0092 OT Individual Time Calculation (min): 60 min     Hospital Problem: Principal Problem:   Debility Active Problems:   Cardiac arrest ([place])   Acute saddle pulmonary embolism with acute cor pulmonale (HCC)   Acute on chronic renal failure (HCC)   Anoxia of brain (HCC)   Past Medical History:  Past Medical History:  Diagnosis Date   Anxiety    Arthritis    Asthma    Cardiac arrest ([place]) 10/28/2021   Cardiac arrest ([place]) 10/28/2021   Depression    Diabetes mellitus without complication (HCC)    Fibromyalgia    GERD (gastroesophageal reflux disease)    Hyperlipemia    Hypertension    PONV (postoperative nausea and vomiting)    Past Surgical History:  Past Surgical History:  Procedure Laterality Date   COLONOSCOPY     DILATION AND CURETTAGE OF UTERUS     IR EMBO ART  VEN HEMORR LYMPH EXTRAV  INC GUIDE ROADMAPPING  11/01/2021   IR FLUORO GUIDE CV LINE RIGHT  11/20/2021   IR THROMBECT PRIM MECH INIT (INCLU) MOD SED  10/29/2021   IR US GUIDE VASC ACCESS RIGHT  10/29/2021   IR US GUIDE VASC ACCESS RIGHT  11/01/2021   IR US GUIDE VASC ACCESS RIGHT  11/21/2021   KNEE ARTHROSCOPY  2003,2005   left   SHOULDER ARTHROSCOPY     left   TRIGGER FINGER RELEASE  08/13/2012   Procedure: RELEASE TRIGGER FINGER/A-1 PULLEY;  Surgeon: [name]., MD;  Location: [facility];  Service: Orthopedics;  Laterality: Right;  long finger   TUBAL LIGATION      Assessment & Plan Clinical Impression: Patient is a 71 y.o. year old female right-handed female with history of asthma, diabetes mellitus, fibromyalgia, hypertension, hyperlipidemia.  She quit smoking 11 years  ago.  Presented 10/28/2021 after a presyncopal episode at home where her daughter lowered her to the ground.  She regained some consciousness and told her daughter she felt like her blood glucose was low.  She lost consciousness right before EMS arrived.  EMS arrived she was VF and had 20 minutes of CPR.  She received shock, amiodarone and epinephrine.  She coded twice in the ED requiring intubation for airway protection.  She required frequent pushes of epinephrine for bradycardia and hypotension.  EKG junctional rhythm concerning for STEMI.  Angiogram of the chest showed saddle pulmonary embolus extending into the segmental and subsegmental levels in the left lower lobe in all 3 right lobes.  Associated Right heart strain.  Vascular ultrasound lower extremities negative for DVT.  She did require mechanical thrombectomy for pulmonary emboli.  Patient was slowly weaned from pressors.  She was extubated 11/06/2021.  On 11/08/2021 MRI abdomen showed a large area of intrahepatic hemorrhage and she did require multiple transfusions and underwent embolization 10/31/2021.  Nephrology consulted for renal failure secondary to ATN and hemodialysis initiated and awaiting plan for long-term hemodialysis.  Bouts of thrombocytopenia suspect ITP and was getting Nplate prn and Dex.  With latest hemoglobin 7.8 platelet count improved to 91,000.  She was cleared to begin Eliquis for history of pulmonary emboli. Patient transferred to CIR on 12/04/2021 .  Patient currently requires  max to total A   with basic self-care skills and and basic mobility   secondary to muscle weakness and acute pain in right LE, decreased cardiorespiratoy endurance, unbalanced muscle activation and decreased coordination, decreased initiation, decreased attention, decreased awareness, decreased problem solving, decreased safety awareness, decreased memory, and delayed processing, and decreased sitting balance, decreased standing balance, decreased balance  strategies, and difficulty maintaining precautions.  Prior to hospitalization, patient could complete ADL with independent .  Patient will benefit from skilled intervention to decrease level of assist with basic self-care skills and increase independence with basic self-care skills prior to discharge home with care partner.  Anticipate patient will require 24 hour supervision and minimal physical assistance and follow up home health.  OT - End of Session Activity Tolerance: Tolerates 10 - 20 min activity with multiple rests Endurance Deficit: Yes Endurance Deficit Description: exteme fatigue and debility OT Assessment Rehab Potential (ACUTE ONLY): Good OT Patient demonstrates impairments in the following area(s): Balance;Skin Integrity;Cognition;Edema;Endurance;Motor;Pain;Safety OT Basic ADL's Functional Problem(s): Grooming;Bathing;Dressing;Toileting OT Transfers Functional Problem(s): Toilet;Tub/Shower OT Additional Impairment(s): None OT Plan OT Intensity: Minimum of 1-2 x/day, 45 to 90 minutes OT Frequency: 5 out of 7 days OT Duration/Estimated Length of Stay: ~3 Weeks OT Treatment/Interventions: Balance/vestibular training;Neuromuscular re-education;Self Care/advanced ADL retraining;Therapeutic Exercise;Wheelchair propulsion/positioning;Cognitive remediation/compensation;DME/adaptive equipment instruction;Pain management;Skin care/wound managment;UE/LE Strength taining/ROM;Community reintegration;Patient/family education;Splinting/orthotics;UE/LE Coordination activities;Discharge planning;Functional mobility training;Psychosocial support;Therapeutic Activities OT Self Feeding Anticipated Outcome(s): n/a OT Basic Self-Care Anticipated Outcome(s): min A OT Toileting Anticipated Outcome(s): min A OT Bathroom Transfers Anticipated Outcome(s): min A OT Recommendation Recommendations for Other Services: Neuropsych consult Patient destination: Home Follow Up Recommendations: Home health  OT Equipment Recommended: To be determined   OT Evaluation Precautions/Restrictions  Precautions Precautions: Fall;Other (comment) Precaution Comments: cortrak; right leg discomfort - PTA Restrictions Weight Bearing Restrictions: No General Chart Reviewed: Yes Family/Caregiver Present: Yes (daughter)  Pain  Ongoing pain in right LE (PTA); relief with rest; offer frequent rest breaks Home Living/Prior Functioning Home Living Family/patient expects to be discharged to:: Private residence Living Arrangements: Alone Available Help at Discharge: Family, Available 24 hours/day Type of Home: Apartment Home Access: Stairs to enter [employer] of Steps: 1 Entrance Stairs-Rails: None Home Layout: Able to live on main level with bedroom/bathroom, Multi-level, 1/2 bath on main level, Bed/bath upstairs Alternate Level Stairs-Number of Steps: 6 stairs, landing, then 6 more Alternate Level Stairs-Rails: Left  Lives With: Alone Prior Function  Able to Take Stairs?: Yes Driving: Yes Vocation: Retired Surveyor, mining Baseline Vision/History: 1 Wears glasses Ability to See in Adequate Light: 0 Adequate Patient Visual Report: No change from baseline Vision Assessment?: Vision impaired- to be further tested in functional context Perception  Perception: Impaired Praxis Praxis: Intact Cognition Overall Cognitive Status: Impaired/Different from baseline Arousal/Alertness: Awake/alert Orientation Level: Person;Place;Situation Person: Oriented Place: Disoriented Situation: Disoriented Year: 2023 Month: February Day of Week: Incorrect (Friday) Immediate Memory Recall: Sock;Blue;Bed Memory Recall Sock: Not able to recall Memory Recall Blue: Without Cue Memory Recall Bed: Not able to recall Attention: Sustained Focused Attention: Appears intact Sustained Attention: Impaired Sustained Attention Impairment: Functional basic Awareness: Impaired Awareness Impairment: Intellectual  impairment Problem Solving: Impaired Problem Solving Impairment: Functional basic;Verbal basic Safety/Judgment: Impaired Sensation Sensation Light Touch: Appears Intact Hot/Cold: (P) Not tested Proprioception: Impaired Detail Proprioception Impaired Details: Impaired LLE;Impaired RLE Stereognosis: (P) Not tested Additional Comments: (P) Impaired R foot and ankle, ~60% compared to L side Coordination Gross Motor Movements are Fluid and Coordinated: No Fine Motor Movements are Fluid and Coordinated:  Yes Motor  Motor Motor: Other (comment) Motor - Skilled Clinical Observations: extremely deconditioned; generalized weakness and ongoing pain in LES  Trunk/Postural Assessment  Cervical Assessment Cervical Assessment: Within Functional Limits Thoracic Assessment Thoracic Assessment:  (rounded shoulders) Lumbar Assessment Lumbar Assessment: Exceptions to [facility] (posterior pelvic tilt) Postural Control Postural Control: Deficits on evaluation Righting Reactions: delayed and inadequate Protective Responses: delyed and inadequate  Balance Balance Balance Assessed: Yes Static Sitting Balance Static Sitting - Balance Support: Bilateral upper extremity supported;Feet supported Static Sitting - Level of Assistance: 4: Min assist Static Sitting - Comment/# of Minutes: fatigues very quickly Static Standing Balance Static Standing - Balance Support: Bilateral upper extremity supported Static Standing - Level of Assistance: 1: +2 Total assist Extremity/Trunk Assessment RUE Assessment RUE Assessment: Within Functional Limits LUE Assessment LUE Assessment: Within Functional Limits  Care Tool Care Tool Self Care Eating Eating activity did not occur:  (reports not getting breakfast this morning- not sure)      Oral Care    Oral Care Assist Level: Minimal Assistance - Patient > 75%    Bathing   Body parts bathed by patient: Right arm;Left arm;Chest;Right upper leg;Left upper  leg;Face Body parts bathed by helper: Abdomen;Front perineal area;Buttocks;Right lower leg;Left lower leg   Assist Level: Maximal Assistance - Patient 24 - 49%    Upper Body Dressing(including orthotics)   What is the patient wearing?: Pull over shirt   Assist Level: Moderate Assistance - Patient 50 - 74%    Lower Body Dressing (excluding footwear)   What is the patient wearing?: Pants;Underwear/pull up Assist for lower body dressing: Maximal Assistance - Patient 25 - 49%    Putting on/Taking off footwear   What is the patient wearing?: Ted hose;Shoes Assist for footwear: Total Assistance - Patient < 25%       Care Tool Toileting Toileting activity Toileting Activity did not occur (Clothing management and hygiene only): N/A (no void or bm)       Care Tool Bed Mobility Roll left and right activity        Sit to lying activity        Lying to sitting on side of bed activity         Care Tool Transfers Sit to stand transfer   Sit to stand assist level: Dependent - Patient 0%    Chair/bed transfer   Chair/bed transfer assist level: Dependent - mechanical lift     Toilet transfer   Assist Level: 2 Helpers     Care Tool Cognition  Expression of Ideas and Wants Expression of Ideas and Wants: 3. Some difficulty - exhibits some difficulty with expressing needs and ideas (e.g, some words or finishing thoughts) or speech is not clear  Understanding Verbal and Non-Verbal Content Understanding Verbal and Non-Verbal Content: 2. Sometimes understands - understands only basic conversations or simple, direct phrases. Frequently requires cues to understand   Memory/Recall Ability     Refer to Care Plan for Long Term Goals  SHORT TERM GOAL WEEK 1 OT Short Term Goal 1 (Week 1): PT will performed sit to stand with mod A of +1 in prep for clothing managment for toileting/ dressing OT Short Term Goal 2 (Week 1): Pt will don shirt with min A in unsupported seated position OT Short  Term Goal 3 (Week 1): Pt will be oriented x4 with min cues consistently  Recommendations for other services: None    Skilled Therapeutic Intervention 1:1 OT eval initiated with OT goals, purpose and  role. Self care retraining at sink level . Pt received in w/c.  Daughter present in session but on a work call. Engaged in self care retraining at the sink level. Pt initially not oriented to place, situation or time of day but improved later in the session in quieter environment. Pt does present with confusion, decr attention or awareness. Pt bathed and dressed as mentioned below. Pt required A to lean forward in chair and maintain unsupported sitting balance. Pt required rest breaks frequently in session. Pt required A to sequence and coordinate threading (home) brief and pants. Attempted to stand at sink but unable to achieve any clearance off the chair. Used the STEDY to try different approach to come into standing - requiring multiple attempts and total A to lift up and total  to pull up pants with +2. With pt sitting on perch of STEDY pt able to come into standing (modified sit to stand) with min A- performed 5 reps. Difficulty with powering up. In w/c working on LEs coordination and strengthening trying to propel w/c with bilateral LEs but required min to mod A.  Also tried propelling w/c with bilateral Hands but difficulty with coordination - right appeared weaker than left. In parallel bars attempted to stand again requiring max to total A to come into standing and mod to maintain.   Left sitting up in w/c with daughter present.    ADLL ADL Grooming: Moderate assistance Where Assessed-Grooming: Sitting at sink Upper Body Bathing: Minimal assistance Where Assessed-Upper Body Bathing: Sitting at sink Lower Body Bathing: Dependent Where Assessed-Lower Body Bathing: Sitting at sink Upper Body Dressing: Moderate assistance Where Assessed-Upper Body Dressing: Sitting at sink Lower Body Dressing:  Dependent Where Assessed-Lower Body Dressing: Sitting at sink Toileting: Unable to assess Toilet Transfer: Dependent Toilet Transfer Method: Other (comment) (use STEDY total A to come into standing) Social research officer, government: Unable to assess Mobility  Bed Mobility Bed Mobility: Rolling Right;Supine to Sit Rolling Right: Minimal Assistance - Patient > 75% Supine to Sit: Moderate Assistance - Patient 50-74% Transfers Sit to Stand: 2 Helpers;Total Assistance - Patient < 25%   Discharge Criteria: Patient will be discharged from OT if patient refuses treatment 3 consecutive times without medical reason, if treatment goals not met, if there is a change in medical status, if patient makes no progress towards goals or if patient is discharged from hospital.  The above assessment, treatment plan, treatment alternatives and goals were discussed and mutually agreed upon: by patient and by family  Nicoletta Ba 12/05/2021, 11:53 AM

## 2021-12-05 NOTE — Evaluation (Signed)
Speech Language Pathology Assessment and Plan  Patient Details  Name: [name] MRN: [id number] Date of Birth: 04-28-1951  SLP Diagnosis: Dysphagia;Cognitive Impairments;Speech and Language deficits  Rehab Potential: Good ELOS: 18-21 days    Today's Date: 12/05/2021 SLP Individual Time: 0730-0830 SLP Individual Time Calculation (min): 60 min   Hospital Problem: Principal Problem:   Debility Active Problems:   Cardiac arrest ([place])   Acute saddle pulmonary embolism with acute cor pulmonale (HCC)   Acute on chronic renal failure (HCC)   Anoxia of brain ([place])  Past Medical History:  Past Medical History:  Diagnosis Date   Anxiety    Arthritis    Asthma    Cardiac arrest ([place]) 10/28/2021   Cardiac arrest ([place]) 10/28/2021   Depression    Diabetes mellitus without complication (HCC)    Fibromyalgia    GERD (gastroesophageal reflux disease)    Hyperlipemia    Hypertension    PONV (postoperative nausea and vomiting)    Past Surgical History:  Past Surgical History:  Procedure Laterality Date   COLONOSCOPY     DILATION AND CURETTAGE OF UTERUS     IR EMBO ART  VEN HEMORR LYMPH EXTRAV  INC GUIDE ROADMAPPING  11/01/2021   IR FLUORO GUIDE CV LINE RIGHT  11/20/2021   IR THROMBECT PRIM MECH INIT (INCLU) MOD SED  10/29/2021   IR US GUIDE VASC ACCESS RIGHT  10/29/2021   IR US GUIDE VASC ACCESS RIGHT  11/01/2021   IR US GUIDE VASC ACCESS RIGHT  11/21/2021   KNEE ARTHROSCOPY  2003,2005   left   SHOULDER ARTHROSCOPY     left   TRIGGER FINGER RELEASE  08/13/2012   Procedure: RELEASE TRIGGER FINGER/A-1 PULLEY;  Surgeon: [name]., MD;  Location: [place];  Service: Orthopedics;  Laterality: Right;  long finger   TUBAL LIGATION      Assessment / Plan / Recommendation Clinical Impression Patient is a 71 year old right-handed female with history of asthma, diabetes mellitus, fibromyalgia, hypertension, and hyperlipidemia.  Presented 10/28/2021 after a presyncopal  episode at home where her daughter lowered her to the ground.  She regained some consciousness and told her daughter she felt like her blood glucose was low.  She lost consciousness right before EMS arrived.  EMS arrived she was VF and had 20 minutes of CPR.  She received shock, amiodarone and epinephrine. She coded twice in the ED requiring intubation for airway protection.  She required frequent pushes of epinephrine for bradycardia and hypotension.  EKG junctional rhythm concerning for STEMI.  Angiogram of the chest showed saddle pulmonary embolus extending into the segmental and subsegmental levels in the left lower lobe in all 3 right lobes.  Associated Right heart strain.   She did require mechanical thrombectomy for pulmonary emboli.  Patient was slowly weaned from pressors.  She was extubated 11/06/2021.  On 11/08/2021 MRI abdomen showed a large area of intrahepatic hemorrhage and she did require multiple transfusions and underwent embolization 10/31/2021.  Nephrology consulted for renal failure secondary to ATN and hemodialysis initiated and awaiting plan for long-term hemodialysis. Therapy evaluations completed due to patient decreased functional mobility was admitted for a comprehensive rehab program on 12/04/21.  Upon arrival, patient was asleep in bed and required extra time for arousal. Patient disoriented to place, time and situation and reported she had already consumed her breakfast. SLP administered the [facility] Mental Status Examination (SLUMS). Patient scored  10/30 points with a score of 27 or  above considered normal.  Patient demonstrated deficits in attention, problem solving and recall. Patient with minimal awareness regarding performance on assessment and difficulty with tasks and also required overall Max A multimodal cues for arousal throughout session. Intermittent high-level word-finding deficits were noted at the sentence level. NT had brought the patient's breakfast tray into  the room. Patient became verbally frustrated that she had not received her breakfast yet, SLP reminded patient that she reported that she had already eaten and even declined trials. Patient consumed thin liquids via straw without overt s/s of aspiration and demonstrated a quick swallow response. Patient is currently edentulous due to missing dentures and demonstrated difficulty with mastication of Dys. 3 textures (french toast). SLP spoke to patient's daughter who reported that the patient does have difficulty with bread but has been consuming all other Dys. 3 textures without difficulty. Recommend patient remain on Dys. 3 textures to offer more variety in hopes of maximizing PO intake. Patient would benefit from skilled SLP intervention to maximize her cognitive-linguistic and swallowing function prior to discharge.     Skilled Therapeutic Interventions          Administered a cognitive-linguistic evaluation and BSE, please see above for details.   SLP Assessment  Patient will need skilled Speech Lanaguage Pathology Services during CIR admission    Recommendations  SLP Diet Recommendations: Dysphagia 3 (Mech soft);Thin Liquid Administration via: Straw Medication Administration: Whole meds with liquid Supervision: Patient able to self feed;Intermittent supervision to cue for compensatory strategies Compensations: Small sips/bites;Slow rate;Minimize environmental distractions Postural Changes and/or Swallow Maneuvers: Seated upright 90 degrees Oral Care Recommendations: Oral care BID Recommendations for Other Services: Neuropsych consult Patient destination: Home Follow up Recommendations: 24 hour supervision/assistance;Home Health SLP;Outpatient SLP Equipment Recommended: None recommended by SLP    SLP Frequency 3 to 5 out of 7 days   SLP Duration  SLP Intensity  SLP Treatment/Interventions 18-21 days  Minumum of 1-2 x/day, 30 to 90 minutes  Cognitive  remediation/compensation;Dysphagia/aspiration precaution training;Internal/external aids;Speech/Language facilitation;Cueing hierarchy;Environmental controls;Therapeutic Activities;Functional tasks;Patient/family education    Pain Pain Assessment Pain Scale: 0-10 Pain Score: 0-No pain  SLP Evaluation Cognition Overall Cognitive Status: Impaired/Different from baseline Arousal/Alertness: Lethargic Orientation Level: Oriented to person;Oriented to place;Disoriented to situation;Disoriented to time Attention: Sustained Focused Attention: Appears intact Sustained Attention: Impaired Sustained Attention Impairment: Functional basic Memory: Impaired Memory Impairment: Storage deficit;Decreased recall of new information Awareness: Impaired Awareness Impairment: Intellectual impairment Problem Solving: Impaired Problem Solving Impairment: Functional basic;Verbal basic Safety/Judgment: Impaired  Comprehension Auditory Comprehension Overall Auditory Comprehension: Appears within functional limits for tasks assessed Expression Expression Primary Mode of Expression: Verbal Verbal Expression Overall Verbal Expression: Impaired Initiation: No impairment Automatic Speech: Name;Social Response Repetition: No impairment Naming: No impairment Other Verbal Expression Comments: Intermittent high-level word-finding deficits noted Written Expression Dominant Hand: Right Written Expression: Not tested Oral Motor Oral Motor/Sensory Function Overall Oral Motor/Sensory Function: Within functional limits Motor Speech Overall Motor Speech: Impaired Respiration: Within functional limits Phonation: Low vocal intensity Resonance: Within functional limits Articulation: Within functional limitis Intelligibility: Intelligibility reduced Word: 75-100% accurate Phrase: 75-100% accurate Sentence: 75-100% accurate Conversation: 75-100% accurate Motor Planning: Witnin functional limits Effective  Techniques: Increased vocal intensity  Care Tool Care Tool Cognition Ability to hear (with hearing aid or hearing appliances if normally used Ability to hear (with hearing aid or hearing appliances if normally used): 0. Adequate - no difficulty in normal conservation, social interaction, listening to TV   Expression of Ideas and Wants Expression of Ideas and Wants: 3.  Some difficulty - exhibits some difficulty with expressing needs and ideas (e.g, some words or finishing thoughts) or speech is not clear   Understanding Verbal and Non-Verbal Content Understanding Verbal and Non-Verbal Content: 2. Sometimes understands - understands only basic conversations or simple, direct phrases. Frequently requires cues to understand  Memory/Recall Ability Memory/Recall Ability : Current season;That he or she is in a hospital/hospital unit    Bedside Swallowing Assessment General Date of Onset: 10/28/21 Previous Swallow Assessment: BSE on acute, patient eventually upgraded to Dys. 3 textures with thin liquids Diet Prior to this Study: Dysphagia 3 (soft);Thin liquids Temperature Spikes Noted: No Respiratory Status: Room air History of Recent Intubation: Yes Length of Intubations (days): 10 days Date extubated: 11/06/21 Behavior/Cognition: Cooperative;Requires cueing;Lethargic/Drowsy;Distractible Oral Cavity - Dentition: Missing dentition;Edentulous Self-Feeding Abilities: Able to feed self Vision: Functional for self-feeding Patient Positioning: Upright in bed Baseline Vocal Quality: Low vocal intensity Volitional Cough: Weak Volitional Swallow: Able to elicit  Oral Care Assessment Does patient have any of the following "high(er) risk" factors?: None of the above Does patient have any of the following "at risk" factors?: None of the above Patient is HIGHER RISK:  Mechanically ventilated (Intubated or Tracheostomy): Order set for Adult Oral Care Protocol initiated - "Higher Risk Patients:   Mechanically Ventilated" option selected (see row information) Patient is HIGH RISK: Non-ventilated: Order set for Adult Oral Care Protocol initiated - "High Risk Patients - Non-Ventilated" option selected  (see row information) Patient is AT RISK: Order set for Adult Oral Care Protocol initiated -  "At Risk Patients" option selected (see row information) Patient is LOW RISK: Follow universal precautions (see row information) Ice Chips Ice chips: Not tested Thin Liquid Thin Liquid: Within functional limits Presentation: Straw Nectar Thick Nectar Thick Liquid: Not tested Honey Thick Honey Thick Liquid: Not tested Puree Puree: Within functional limits Presentation: Spoon;Self Fed Solid Solid: Impaired Presentation: Self Fed;Spoon Oral Phase Impairments: Impaired mastication Oral Phase Functional Implications: Impaired mastication BSE Assessment Risk for Aspiration Impact on safety and function: Mild aspiration risk Other Related Risk Factors: Prolonged intubation;Deconditioning;Lethargy;Cognitive impairment  Short Term Goals: Week 1: SLP Short Term Goal 1 (Week 1): Patient will demonstrate sustained attention to functional tasks for 5 minutes with Mod verbal cues for redirection. SLP Short Term Goal 2 (Week 1): Patient will demonstrate functional problem solving for basic and familair tasks with Mod verbal and visual cues. SLP Short Term Goal 3 (Week 1): Patient will demonstrate orientation to place, time and situation with Mod verbal and visual cues. SLP Short Term Goal 4 (Week 1): Patient will identify 2 cognitive and 2 physical deficits with Mod multimodal cues. SLP Short Term Goal 5 (Week 1): Patient will consume current diet with minimal overt s/s of aspiration with supervision level verbal cues for use of swallowing compensatory strategies. SLP Short Term Goal 6 (Week 1): Patient will utilize word-finding strategies at the phrase level with Mod verbal cues in 50% of  opportunities.  Refer to Care Plan for Long Term Goals  Recommendations for other services: Neuropsych  Discharge Criteria: Patient will be discharged from SLP if patient refuses treatment 3 consecutive times without medical reason, if treatment goals not met, if there is a change in medical status, if patient makes no progress towards goals or if patient is discharged from hospital.  The above assessment, treatment plan, treatment alternatives and goals were discussed and mutually agreed upon: by patient and by family  Axie Hayne 12/05/2021, 3:34 PM

## 2021-12-05 NOTE — Progress Notes (Signed)
Inpatient Rehabilitation Admission Medication Review by a Pharmacist  A complete drug regimen review was completed for this patient to identify any potential clinically significant medication issues.  High Risk Drug Classes Is patient taking? Indication by Medication  Antipsychotic No   Anticoagulant Yes Eliquis for PE  Antibiotic No   Opioid Yes Oxycodone with h/o fibromyalgia  Antiplatelet No   Hypoglycemics/insulin Yes SSI, Levemir for DM  Vasoactive Medication No   Chemotherapy No   Other No      Clinically significant medication issues were identified that warrant physician communication and completion of prescribed/recommended actions by midnight of the next day:  No   Time spent performing this drug regimen review (minutes):  79min  Kynnadi Dicenso S. Alford Highland, PharmD, BCPS Clinical Staff Pharmacist [email or web address] [place] 12/05/2021 9:12 AM

## 2021-12-05 NOTE — Plan of Care (Signed)
Problem: RH Balance Goal: LTG: Patient will maintain dynamic sitting balance (OT) Description: LTG:  Patient will maintain dynamic sitting balance with assistance during activities of daily living (OT) Flowsheets (Taken 12/05/2021 1435) LTG: Pt will maintain dynamic sitting balance during ADLs with: Independent with assistive device Goal: LTG Patient will maintain dynamic standing with ADLs (OT) Description: LTG:  Patient will maintain dynamic standing balance with assist during activities of daily living (OT)  Flowsheets (Taken 12/05/2021 1435) LTG: Pt will maintain dynamic standing balance during ADLs with: Contact Guard/Touching assist   Problem: Sit to Stand Goal: LTG:  Patient will perform sit to stand in prep for activites of daily living with assistance level (OT) Description: LTG:  Patient will perform sit to stand in prep for activites of daily living with assistance level (OT) Flowsheets (Taken 12/05/2021 1435) LTG: PT will perform sit to stand in prep for activites of daily living with assistance level: Minimal Assistance - Patient > 75%   Problem: RH Bathing Goal: LTG Patient will bathe all body parts with assist levels (OT) Description: LTG: Patient will bathe all body parts with assist levels (OT) Flowsheets (Taken 12/05/2021 1435) LTG: Pt will perform bathing with assistance level/cueing: Minimal Assistance - Patient > 75%   Problem: RH Dressing Goal: LTG Patient will perform upper body dressing (OT) Description: LTG Patient will perform upper body dressing with assist, with/without cues (OT). Flowsheets (Taken 12/05/2021 1435) LTG: Pt will perform upper body dressing with assistance level of: Supervision/Verbal cueing Goal: LTG Patient will perform lower body dressing w/assist (OT) Description: LTG: Patient will perform lower body dressing with assist, with/without cues in positioning using equipment (OT) Flowsheets (Taken 12/05/2021 1435) LTG: Pt will perform lower body dressing  with assistance level of: Minimal Assistance - Patient > 75%   Problem: RH Toileting Goal: LTG Patient will perform toileting task (3/3 steps) with assistance level (OT) Description: LTG: Patient will perform toileting task (3/3 steps) with assistance level (OT)  Flowsheets (Taken 12/05/2021 1435) LTG: Pt will perform toileting task (3/3 steps) with assistance level: Minimal Assistance - Patient > 75%   Problem: RH Toilet Transfers Goal: LTG Patient will perform toilet transfers w/assist (OT) Description: LTG: Patient will perform toilet transfers with assist, with/without cues using equipment (OT) Flowsheets (Taken 12/05/2021 1435) LTG: Pt will perform toilet transfers with assistance level of: Minimal Assistance - Patient > 75%   Problem: RH Tub/Shower Transfers Goal: LTG Patient will perform tub/shower transfers w/assist (OT) Description: LTG: Patient will perform tub/shower transfers with assist, with/without cues using equipment (OT) Flowsheets (Taken 12/05/2021 1435) LTG: Pt will perform tub/shower stall transfers with assistance level of: Minimal Assistance - Patient > 75%   Problem: RH Memory Goal: LTG Patient will demonstrate ability for day to day recall/carry over during activities of daily living with assistance level (OT) Description: LTG:  Patient will demonstrate ability for day to day recall/carry over during activities of daily living with assistance level (OT). Flowsheets (Taken 12/05/2021 1435) LTG:  Patient will demonstrate ability for day to day recall/carry over during activities of daily living with assistance level (OT): Supervision   Problem: RH Attention Goal: LTG Patient will demonstrate this level of attention during functional activites (OT) Description: LTG:  Patient will demonstrate this level of attention during functional activites  (OT) Flowsheets (Taken 12/05/2021 1435) Patient will demonstrate this level of attention during functional activites:  Selective Patient will demonstrate above attention level in the following environment: Home LTG: Patient will demonstrate this level of attention during  functional activites (OT): Minimal Assistance - Patient > 75%   Problem: RH Awareness Goal: LTG: Patient will demonstrate awareness during functional activites type of (OT) Description: LTG: Patient will demonstrate awareness during functional activites type of (OT) Flowsheets (Taken 12/05/2021 1435) Patient will demonstrate awareness during functional activites type of: Emergent LTG: Patient will demonstrate awareness during functional activites type of (OT): Supervision

## 2021-12-05 NOTE — Progress Notes (Signed)
[facility] Progress Note     Assessment/ Plan:   AKI secondary to ATN in the setting of cardiac arrest, hypotension, pressors, contrast.  BL CKD3A with BL creatinine in the 1.1-1.3 range in late 2021.  Has been RRT dependent since 1/4 - this was discussed with the patient and the family on 1/29.  Because they noted she made some urine output plans for outpatient dialysis and permanent access had been delayed.  She last had dialysis on 2/1.  Urine output has been higher this past week but not well documented at times. (Post void?) Bladder scan 2/6 with zero mL - HD today and plan for treatment on Friday, 2/10 as well.  Rehab has asked for schedule and we will do MWF.  They plan therapy schedule a day in advance  - strict ins/outs re-ordered - We have started CLIP for AKI.  Urine output increase is encouraging.  However note started CRRT on 1/4.     - appreciate reduction in gabapentin  - she has a foley     VF cardiac arrest--> OOH VF arrest 2/2 massive PE  Massive PE s/p mechanical thrombectomy 1/2 of R and L PA.  Vasc US- no DVT, liver mass noted 10/28/21 CT abd/ pelvis, s/p MRI abd--> incompletely characterized, had MRI with gadolinium 1/20  to reevaluate -- not definitive.  Oncology following  Retroperitoneal bleed/Hemorrhagic shock/Acute blood loss anemia originating from left liver lobe s/p embolization 1/4--> required FFP, plts, Vit K, pRBCs; now stable. Hematology following - no ESA due to concern for her recent thromboembolic disease. Transfuse when needed   Thrombocytopenia- suspected ITP, s/p Nplate PRN and dex. -  work up and mgmt per heme.  Overall improved  Acute hypoxic RF- extubated 11/07/21, now much improved  Sepsis/ leukocytosis/ aspiration pneumonia- MRI with aspiration PNA.  S/p  CTX and vanc   Metabolic bone disease - hyperphos resolved with HD-   PTH 129  10. Liver lesions - per charting possible biopsy once patient is more stable  11. PSA uti:  present on culture. Pan sensitive. Treatment per primary  Disposition - she is in inpatient rehab.  She does not yet have an outpatient HD unit    Subjective:    Had 700 mL UOP over 2/7.  She had HD earlier this afternoon - I saw her in HD unit after completion of HD.  Per RN had 0.5 kg UF.  She was moved to rehab yesterday.   Review of systems: Denies shortness of breath or chest pain  Denies n/v   Objective:   BP (!) 142/97    Pulse 83    Temp (!) 97.5 F (36.4 C)    Resp 16    Ht 5\' 5"  (1.651 m)    Wt 74.7 kg    SpO2 97%    BMI 27.40 kg/m   Intake/Output Summary (Last 24 hours) at 12/05/2021 1653 Last data filed at 12/05/2021 1645 Gross per 24 hour  Intake 497 ml  Output 1500 ml  Net -1003 ml   Weight change:   Physical Exam:   [id number] female in bed in NAD HEENT:  NCAT LUNGS: clear to auscultation and normal work of breathing at rest CV: S1S2 no rub ABD: soft, nontender/nt EXT: Warm and well perfused, trace to no edema Psych normal mood and affect Neuro awake and conversant, follows commands Gu - foley catheter in place Acces: RIJ tunn dialysis catheter  Imaging: No results found.  Labs: [employer]  Lab 11/29/21 0548 11/30/21 0309 12/01/21 0147 12/02/21 0159 12/03/21 0713 12/04/21 0105 12/05/21 0557  NA 135 133* 133* 132* 134* 134* 136  K 4.2 4.6 5.3* 4.9 5.3* 4.6 4.4  CL 99 97* 97* 95* 95* 97* 99  CO2 27 25 24 27 27 28 24   GLUCOSE 163* 99 182* 290* 98 129* 56*  BUN 26* 39* 56* 29* 45* 53* 51*  CREATININE 2.95* 4.04* 5.02* 2.96* 4.45* 4.97* 5.74*  CALCIUM 8.4* 8.6* 8.7* 8.2* 8.8* 8.8* 8.6*  PHOS 2.7 2.8 3.0 3.2 4.3  --   --    CBC Recent Labs  Lab 12/02/21 0159 12/03/21 0713 12/04/21 0105 12/05/21 0557  WBC 8.2 9.7 9.4 9.6  NEUTROABS  --  7.1 6.7 6.9  HGB 7.4* 7.3* 7.8* 7.4*  HCT 22.9* 23.0* 23.6* 23.1*  MCV 90.2 90.2 88.7 88.8  PLT 61* 78* 91* 105*    Medications:     apixaban  5 mg Oral BID   atorvastatin  20 mg Oral Daily    B-complex with vitamin C  1 tablet Oral Daily   chlorhexidine  15 mL Mouth Rinse BID   Chlorhexidine Gluconate Cloth  6 each Topical Daily   dicyclomine  10 mg Oral TID AC   DULoxetine  20 mg Oral Daily   feeding supplement (NEPRO CARB STEADY)  237 mL Oral TID BM   folic acid  2 mg Oral Daily   gabapentin  100 mg Oral BID   heparin sodium (porcine)       insulin aspart  0-9 Units Subcutaneous Q4H   insulin detemir  4 Units Subcutaneous BID   mupirocin cream  1 application Topical BID   pantoprazole  40 mg Oral BID    Claudia Desanctis, MD 12/05/2021 4:59 PM

## 2021-12-06 ENCOUNTER — Inpatient Hospital Stay (HOSPITAL_COMMUNITY): Payer: Medicare PPO

## 2021-12-06 LAB — GLUCOSE, CAPILLARY
Glucose-Capillary: 87 mg/dL (ref 70–99)
Glucose-Capillary: 87 mg/dL (ref 70–99)
Glucose-Capillary: 141 mg/dL — ABNORMAL HIGH (ref 70–99)
Glucose-Capillary: 145 mg/dL — ABNORMAL HIGH (ref 70–99)
Glucose-Capillary: 153 mg/dL — ABNORMAL HIGH (ref 70–99)

## 2021-12-06 MED ORDER — BOOST / RESOURCE BREEZE PO LIQD CUSTOM
1.0000 | Freq: Two times a day (BID) | ORAL | Status: DC
  Administered 2021-12-06 – 2021-12-10 (×5): 1 via ORAL

## 2021-12-06 MED ORDER — LIDOCAINE 5 % EX PTCH
2.0000 | MEDICATED_PATCH | CUTANEOUS | Status: DC
  Administered 2021-12-06 – 2022-01-05 (×23): 2 via TRANSDERMAL
  Filled 2021-12-06 (×31): qty 2

## 2021-12-06 MED ORDER — CHLORHEXIDINE GLUCONATE CLOTH 2 % EX PADS
6.0000 | MEDICATED_PAD | Freq: Every day | CUTANEOUS | Status: DC
  Administered 2021-12-07 – 2021-12-11 (×5): 6 via TOPICAL

## 2021-12-06 NOTE — Progress Notes (Addendum)
Patient ID: [name], female   DOB: [birth year], 71 y.o.   MRN: [id number]  SW received updates from [place] [phone]) reporting per nephrology pt will need to be set up for outpatient HD. Working on securing a facility.   SW met with pt in room to provide ELOS 18-21 days. Pt aware SW will follow-up with her dtr.   *SW left message for [name] [phone]) to provide ELOS and inform on referral will be sent for transportation services needed for dialysis, however, informed she should be prepared to transport in the event services are not approved.   SW later spoke with [name] to inform on above. Would like SW to send referral for [place] [facility]. SW informed a referral will be sent. She did express concerns about pt possibly leaving too early. SW informed will provide updates on Tuesday once there is  more information.   SW sent [place] app. SW received updates from [place] reporting pt is pre-certified for transportation, and they can all reservations line to schedule transportation.   [name], MSW, [place] Office: [phone] Cell: [phone] Fax: [phone]

## 2021-12-06 NOTE — Progress Notes (Signed)
Calorie Count Note  48 hour calorie count ordered.  Diet: Dysphagia 3 with thin liquids Supplements: Nepro Shake po TID, each supplement provides 425 kcal and 19 grams protein  Breakfast: 45 kcal, 0 grams of protein Lunch: 100 kcal, 2 grams of protein Dinner: 0% intake Supplements: 850 kcal, 38 grams of protein  Day 1 Total intake: 995 kcal (51% of kcal needs)  40 grams of protein (40% of protein needs)  Estimated Nutritional Needs:  Kcal:  1950-2150 Protein:  100-125 grams Fluid:  1L + UOP  Pt continues to have poor po intake at meals, however has been consuming her nutritional supplements of Nepro. Will additionally order oral nutritional supplements to aid in PO intake as well as in increased nutrition and protein needs. Pt encouraged to eat her food at meals and to drink her supplements.   Nutrition Dx:  Increased nutrient needs related to chronic illness (renal failure on HD) as evidenced by estimated needs; ongoing  Goal:  Patient will meet greater than or equal to 90% of their needs; progressing  Intervention:  Continue calorie count--- RD to follow up tomorrow with day 2 results Continue Nepro Shake po TID, each supplement provides 425 kcal and 19 grams protein Provide Boost Breeze po BID, each supplement provides 250 kcal and 9 grams of protein  Corrin Parker, MS, RD, LDN RD pager number/after hours weekend pager number on [facility].

## 2021-12-06 NOTE — Progress Notes (Signed)
PROGRESS NOTE   Subjective/Complaints:  Pt reports LLE hurting- burning in toes and foot esp.   Tried to eat- ate <25%- ate 1/2 banana.  Has calorie count- insulin d/c'd since stopped TF's/doing calorie count.    ROS: Limited by poor memory/cognition  Objective:   No results found. Recent Labs    12/04/21 0105 12/05/21 0557  WBC 9.4 9.6  HGB 7.8* 7.4*  HCT 23.6* 23.1*  PLT 91* 105*   Recent Labs    12/04/21 0105 12/05/21 0557  NA 134* 136  K 4.6 4.4  CL 97* 99  CO2 28 24  GLUCOSE 129* 56*  BUN 53* 51*  CREATININE 4.97* 5.74*  CALCIUM 8.8* 8.6*    Intake/Output Summary (Last 24 hours) at 12/06/2021 0815 Last data filed at 12/06/2021 0415 Gross per 24 hour  Intake 202 ml  Output 2200 ml  Net -1998 ml        Physical Exam: Vital Signs Blood pressure (!) 164/92, pulse 97, temperature 98.2 F (36.8 C), resp. rate 20, height 5\' 5"  (1.651 m), weight 73.8 kg, SpO2 100 %.    General: awake, alert, appropriate, poor memory; doesn't remember where she is or why here, or me;  NAD HENT: conjugate gaze; oropharynx moist CV: borderline tachycardic rate; sounds regular rhythm; no JVD Pulmonary: CTA B/L; no W/R/R- good air movement GI: soft, NT, ND, (+)BS Psychiatric: flat; irritable Neurological: poor memory; Ox1 Genitourinary:    Comments: Foley tube in place- medium amber urine in bag Musculoskeletal:     Cervical back: Neck supple. No tenderness.     Comments: UE 5-/5 B/L  LE 3/5 HF/KE- DF 4-/5 on L and 2-/5 on R; PF 4-/5 B/L  R foot drop- at rest in PF   Skin:    General: Skin is warm and dry.     Comments: R chest HD catheter  Neurological:     Comments: Patient is a bit lethargic.  Makes eye contact with examiner.  Follows simple commands.  Limited medical historian. Mild tremors with movement Numb/absent/decreased sensation from mid calf to toes B/L L>R Vague- Ox2 with cues    Assessment/Plan: 1. Functional deficits which require 3+ hours per day of interdisciplinary therapy in a comprehensive inpatient rehab setting. Physiatrist is providing close team supervision and 24 hour management of active medical problems listed below. Physiatrist and rehab team continue to assess barriers to discharge/monitor patient progress toward functional and medical goals  Care Tool:  Bathing    Body parts bathed by patient: Right arm, Left arm, Chest, Right upper leg, Left upper leg, Face   Body parts bathed by helper: Abdomen, Front perineal area, Buttocks, Right lower leg, Left lower leg     Bathing assist Assist Level: Maximal Assistance - Patient 24 - 49%     Upper Body Dressing/Undressing Upper body dressing   What is the patient wearing?: Pull over shirt    Upper body assist Assist Level: Moderate Assistance - Patient 50 - 74%    Lower Body Dressing/Undressing Lower body dressing      What is the patient wearing?: Pants, Underwear/pull up     Lower body assist Assist for lower body  dressing: Maximal Assistance - Patient 25 - 49%     Toileting Toileting Toileting Activity did not occur Landscape architect and hygiene only): N/A (no void or bm)  Toileting assist       Transfers Chair/bed transfer  Transfers assist     Chair/bed transfer assist level: Dependent - mechanical lift (stedy)     Locomotion Ambulation   Ambulation assist   Ambulation activity did not occur: Safety/medical concerns          Walk 10 feet activity   Assist  Walk 10 feet activity did not occur: Safety/medical concerns        Walk 50 feet activity   Assist Walk 50 feet with 2 turns activity did not occur: Safety/medical concerns         Walk 150 feet activity   Assist Walk 150 feet activity did not occur: Safety/medical concerns         Walk 10 feet on uneven surface  activity   Assist Walk 10 feet on uneven surfaces activity did not  occur: Safety/medical concerns         Wheelchair     Assist Is the patient using a wheelchair?: Yes Type of Wheelchair: Manual    Wheelchair assist level: Dependent - Patient 0%      Wheelchair 50 feet with 2 turns activity    Assist        Assist Level: Dependent - Patient 0%   Wheelchair 150 feet activity     Assist      Assist Level: Dependent - Patient 0%   Blood pressure (!) 164/92, pulse 97, temperature 98.2 F (36.8 C), resp. rate 20, height 5\' 5"  (1.651 m), weight 73.8 kg, SpO2 100 %.  Medical Problem List and Plan: 1. Functional deficits secondary to debility related to VF cardiac arrest secondary to massive pulmonary emboli status post mechanical thrombectomy             -patient may not shower due to HD catheter- unless it's removed             -ELOS/Goals: 10-14 days supervision to min A for PT/OT SLP  -con't CIR- PT, OT and SLP 2.  Antithrombotics: -DVT/anticoagulation:  Pharmaceutical: Other (comment) Eliquis             -antiplatelet therapy: N/A 3. Pain Management: Neurontin 100 mg twice daily, oxycodone as needed  2/9- had been on gabapentin for nerve pain-will stop due to sedation- will start RLE lidoderm patch 2 patches for pain.  4. Mood anxiety-  Cymbalta 20 mg daily, Xanax 0.25 mg daily as needed provide emotional support             -antipsychotic agents: N/A 5. Neuropsych: This patient is? capable of making decisions on her own behalf. 6. Skin/Wound Care: Routine skin checks 7. Fluids/Electrolytes/Nutrition: Routine in and outs with follow-up chemistries 8.  Retroperitoneal bleed/hemorrhagic shock/acute blood loss anemia/status post left liver lobe embolization  2/9- will check LFTs tomorrow to make sure liver function doing OK? 9.  AKI secondary to ATN.  Follow-up per renal services.  Started clip for AKI- not ESRD, however Cr almost 5 today- getting HD tomorrow 2/8.   2/9- last Cr 5.74- up from 4.97 up from 4.45- rising- per  renal- likely HD tomorrow?.  10.  Thrombocytopenia.  Suspect ITP.  Follow-up per hematology  2/9- plts 105k- will con't to monitor 11.  Hyperlipidemia.  Lipitor 12. Diabetes- Poor appetite- has Cortrak for intake due to poor  appetite-stop insulin- was on at home- levemir 4 units BID when came to rehab, but dropping CBGs- so will hold- will do CBGs BID Will assess if still needs- d/c insulin due to TFs 13. New onset confusion/poor memory  2/9- will check CT of head- and stop Gabapentin for nerve pain due to increased sedation- had before but so out of it per daughter- much worse last 2 days. If negative, will order MRI, since much worse cognitively.  14. ABLA with chronic anemia  2/9- Hb dropped again from 7.8 to 7.4- wil monitor and recheck regularly- will likely need transfusion.  15. Scab on back of head-  2/9- on the back of her head   I spent a total of 52   minutes on total care today- >50% coordination of care- due to prolonged d/w with daughter- following confusion, and work up for sedation/CT head, etc.        LOS: 2 days A FACE TO FACE EVALUATION WAS PERFORMED  [name] 12/06/2021, 8:15 AM

## 2021-12-06 NOTE — Consult Note (Signed)
[facility] Nurse Consult Note: Patient receiving care in [facility] [phone]. Reason for Consult: scalp wound Wound type: the hair to the occipital area is matted and the scalp below the matted hair cannot be visualized. Pressure Injury POA: Yes/No/NA Measurement: unknown Wound bed: unknown Drainage (amount, consistency, odor) none. Patient endorses pain to light palpation to area Periwound: unknown Dressing procedure/placement/frequency: I have entered an every shift order for staff to shampoo her hair and to brush out the matts to the area to the greatest extent possible.  Once the matts are removed, the scalp can be visualized.  Until that time there is no way to know what is underneath the matted hair.  I discussed my findings with Angiulli, Lavon Paganini, PA-C. WOC nurse will not follow at this time.  Please re-consult the [facility] team if needed.  Val Riles, RN, MSN, CWOCN, CNS-BC, pager [phone]

## 2021-12-06 NOTE — Progress Notes (Signed)
Requested to see pt for out-pt HD needs. Met with pt at bedside and spoke to pt's dtr via phone per pt's request. Pt prefers [facility] for out-pt HD at d/c. Referral made to [facility] admissions this afternoon. Dtr voices interest in transportation options to/from HD in the event dtr cannot transport. Contacted CSW via secure chat to make her aware of dtr's request for resources/options. Will assist with HD needs.   Melven Sartorius Renal Navigator [phone]

## 2021-12-06 NOTE — Progress Notes (Signed)
[facility] Progress Note     Assessment/ Plan:   AKI secondary to ATN in the setting of cardiac arrest, hypotension, pressors, contrast.  BL CKD3A with BL creatinine in the 1.1-1.3 range in late 2021.  Has been RRT dependent since 1/4 - this was discussed with the patient and the family on 1/29.  Because they noted she made some urine output plans for outpatient dialysis and permanent access had been delayed.  She last had dialysis on 2/1.  Urine output has been higher this past week but not well documented at times. (Post void?) Bladder scan 2/6 with zero mL - HD per MWF schedule.  Rehab has asked for schedule .They plan therapy schedule a day in advance  - strict ins/outs re-ordered - We have started CLIP for AKI.  Urine output increase is encouraging.  However note duration of AKI - started CRRT on 1/4.     - appreciate reduction in gabapentin  - she has a foley     VF cardiac arrest--> OOH VF arrest 2/2 massive PE  Massive PE s/p mechanical thrombectomy 1/2 of R and L PA.  Vasc US- no DVT, liver mass noted 10/28/21 CT abd/ pelvis, s/p MRI abd--> incompletely characterized, had MRI with gadolinium 1/20  to reevaluate -- not definitive. S/p hem/onc eval   Retroperitoneal bleed/Hemorrhagic shock/Acute blood loss anemia originating from left liver lobe s/p embolization 1/4--> required FFP, plts, Vit K, pRBCs; now stable. Hematology following - no ESA due to concern for her recent thromboembolic disease. Transfuse when needed   Thrombocytopenia- suspected ITP, s/p Nplate PRN and dex. -  work up and mgmt per heme.  Overall improved  Acute hypoxic RF- extubated 11/07/21, now much improved  Sepsis/ leukocytosis/ aspiration pneumonia- MRI with aspiration PNA.  S/p  CTX and vanc   Metabolic bone disease - hyperphos resolved with HD-   PTH 129  10. Liver lesions - per charting possible biopsy once patient is more stable  11. PSA uti: present on culture. Pan sensitive. Treatment  per primary  Disposition - she is in inpatient rehab.  She does not yet have an outpatient HD unit    Subjective:    She had 1.7 liters UOP over 2/8.  Last HD on 2/8 with 0.5 kg UF. Appetite is not great.   Review of systems:  Denies shortness of breath or chest pain  Denies n/v States she has a foley placed a couple of days ago for retention per self-report   Objective:   BP (!) 164/92 (BP Location: Left Arm)    Pulse 97    Temp 98.2 F (36.8 C)    Resp 20    Ht 5\' 5"  (1.651 m)    Wt 73.8 kg    SpO2 100%    BMI 27.07 kg/m   Intake/Output Summary (Last 24 hours) at 12/06/2021 1524 Last data filed at 12/06/2021 0820 Gross per 24 hour  Intake 85 ml  Output 1900 ml  Net -1815 ml   Weight change: -4.7 kg  Physical Exam:    OXB:DZHGDJM female in bed in NAD HEENT:  NCAT LUNGS: clear to auscultation and normal work of breathing at rest CV: S1S2 no rub ABD: soft, nontender/nt EXT: Warm and well perfused, no edema Psych normal mood and affect Neuro awake and conversant, follows commands Gu - foley catheter in place Acces: RIJ tunn dialysis catheter  Imaging: DG Abd 1 View  Result Date: 12/06/2021 CLINICAL DATA:  Constipation EXAM: ABDOMEN -  1 VIEW COMPARISON:  12/02/2021 FINDINGS: Enteric tube tip is at the distal stomach. Bowel gas pattern is unremarkable. Stool burden is mild. IMPRESSION: Unremarkable bowel gas pattern. Electronically Signed   By: Macy Mis M.D.   On: 12/06/2021 11:52   CT HEAD WO CONTRAST (5MM)  Result Date: 12/06/2021 CLINICAL DATA:  Delirium. Status post cardiac arrest. Increasing confusion. EXAM: CT HEAD WITHOUT CONTRAST TECHNIQUE: Contiguous axial images were obtained from the base of the skull through the vertex without intravenous contrast. RADIATION DOSE REDUCTION: This exam was performed according to the departmental dose-optimization program which includes automated exposure control, adjustment of the mA and/or kV according to patient size and/or use  of iterative reconstruction technique. COMPARISON:  CT brain 11/08/2021 FINDINGS: Brain: There is mild cortical atrophy, within normal limits for patient age. The ventricles are normal in configuration. The basilar cisterns are patent. No mass, mass effect, or midline shift. No acute intracranial hemorrhage is seen. No abnormal extra-axial fluid collection. There are atherosclerotic intracranial calcifications. Minimal periventricular white matter hypodensities are similar to prior, likely chronic ischemic white matter changes. Preservation of the normal cortical gray-white interface without CT evidence of an acute major vascular territorial cortical based infarction. Vascular: No hyperdense vessel or unexpected calcification. Skull: Normal. Negative for fracture or focal lesion. Sinuses/Orbits: The visualized orbits are unremarkable. The visualized paranasal sinuses and mastoid air cells are clear. Other: There is mildly increased density within the posterior inferior scalp at the level of the occiput in the region measuring up to 4 cm in transverse dimension and 3 cm in craniocaudal dimension, a possible contusion but nonspecific. IMPRESSION: No acute intracranial process. Increased density within the posterior inferior scalp at the level of the occiput in the region measuring up to 4 cm in transverse dimension and 3 cm in craniocaudal dimension, a possible contusion but nonspecific. This is new compared to 11/08/2021. Electronically Signed   By: Yvonne Kendall M.D.   On: 12/06/2021 15:20    Labs: BMET Recent Labs  Lab 11/30/21 0309 12/01/21 0147 12/02/21 0159 12/03/21 0713 12/04/21 0105 12/05/21 0557  NA 133* 133* 132* 134* 134* 136  K 4.6 5.3* 4.9 5.3* 4.6 4.4  CL 97* 97* 95* 95* 97* 99  CO2 25 24 27 27 28 24   GLUCOSE 99 182* 290* 98 129* 56*  BUN 39* 56* 29* 45* 53* 51*  CREATININE 4.04* 5.02* 2.96* 4.45* 4.97* 5.74*  CALCIUM 8.6* 8.7* 8.2* 8.8* 8.8* 8.6*  PHOS 2.8 3.0 3.2 4.3  --   --     CBC Recent Labs  Lab 12/02/21 0159 12/03/21 0713 12/04/21 0105 12/05/21 0557  WBC 8.2 9.7 9.4 9.6  NEUTROABS  --  7.1 6.7 6.9  HGB 7.4* 7.3* 7.8* 7.4*  HCT 22.9* 23.0* 23.6* 23.1*  MCV 90.2 90.2 88.7 88.8  PLT 61* 78* 91* 105*    Medications:     apixaban  5 mg Oral BID   atorvastatin  20 mg Oral Daily   B-complex with vitamin C  1 tablet Oral Daily   chlorhexidine  15 mL Mouth Rinse BID   Chlorhexidine Gluconate Cloth  6 each Topical Daily   dicyclomine  10 mg Oral TID AC   DULoxetine  20 mg Oral Daily   feeding supplement  1 Container Oral BID BM   feeding supplement (NEPRO CARB STEADY)  237 mL Oral TID BM   folic acid  2 mg Oral Daily   lidocaine  2 patch Transdermal Q24H   mupirocin  cream  1 application Topical BID   pantoprazole  40 mg Oral BID    Claudia Desanctis, MD 12/06/2021 3:41 PM

## 2021-12-06 NOTE — Progress Notes (Signed)
Occupational Therapy Session Note  Patient Details  Name: [name] MRN: [id number] Date of Birth: [birth year]  Today's Date: 12/06/2021 OT Individual Time: 3354-5625 OT Individual Time Calculation (min): 71 min    Short Term Goals: Week 1:  OT Short Term Goal 1 (Week 1): PT will performed sit to stand with mod A of +1 in prep for clothing managment for toileting/ dressing OT Short Term Goal 2 (Week 1): Pt will don shirt with min A in unsupported seated position OT Short Term Goal 3 (Week 1): Pt will be oriented x4 with min cues consistently  Skilled Therapeutic Interventions/Progress Updates:    Pt resting in w/c upon arrival. Pt declined bathing/dressing and stated that she preferred a female therapist assist with BADLs. Pt oriented to place only this morning. Pt stated she was in the hospital because she fell. Orientation provided. Pt surprised. Pt requested to use toilet. Sit<>stand with Stedy. Mod A for sit<>standf and max verbal cues for sequencing. Dependent for toileting tasks. Pt transitioned to Day Room. Pt presented with colored peg board. Pt recognized alternating color patterns but required tot verbal cues/A to complete. Pt retuned to room and remained in w/c with all needs within reach. Belt alarm activated.   Therapy Documentation Precautions:  Precautions Precautions: Fall, Other (comment) Precaution Comments: cortrak; right leg discomfort - PTA Restrictions Weight Bearing Restrictions: No  Pain: Pain Assessment Pain Scale: 0-10 Pain Score: 0-No pain  Therapy/Group: Individual Therapy  Leroy Libman 12/06/2021, 10:44 AM

## 2021-12-06 NOTE — Care Management (Signed)
Inpatient [facility] Statement of Services  Patient Name:  [name]  Date:  12/06/2021  Welcome to the [facility].  Our goal is to provide you with an individualized program based on your diagnosis and situation, designed to meet your specific needs.  With this comprehensive rehabilitation program, you will be expected to participate in at least 3 hours of rehabilitation therapies Monday-Friday, with modified therapy programming on the weekends.  Your rehabilitation program will include the following services:  Physical Therapy (PT), Occupational Therapy (OT), Speech Therapy (ST), 24 hour per day rehabilitation nursing, Therapeutic Recreaction (TR), Psychology, Neuropsychology, Care Coordinator, Rehabilitation Medicine, [place], and Other  Weekly team conferences will be held on Tuesdays to discuss your progress.  Your Inpatient Rehabilitation Care Coordinator will talk with you frequently to get your input and to update you on team discussions.  Team conferences with you and your family in attendance may also be held.  Expected length of stay: 18-21 days    Overall anticipated outcome: Minimal Assistance   Depending on your progress and recovery, your program may change. Your Inpatient Rehabilitation Care Coordinator will coordinate services and will keep you informed of any changes. Your Inpatient Rehabilitation Care Coordinator's name and contact numbers are listed  below.  The following services may also be recommended but are not provided by the [facility] will be made to provide these services after discharge if needed.  Arrangements include referral to agencies that provide these services.  Your insurance has been verified to be:  [employer]  Your  primary doctor is:  [name]  Pertinent information will be shared with your doctor and your insurance company.  Inpatient Rehabilitation Care Coordinator:  Cathleen Corti [phone] or ([phone]  Information discussed with and copy given to patient by: Rana Snare, 12/06/2021, 11:25 AM

## 2021-12-06 NOTE — Progress Notes (Signed)
Physical Therapy Session Note  Patient Details  Name: [name] MRN: [id number] Date of Birth: 1951-02-03  Today's Date: 12/06/2021 PT Individual Time: 781 044 [id number] and 1445-1530 PT Individual Time Calculation (min): 60 min and 45 min  Short Term Goals: Week 1:  PT Short Term Goal 1 (Week 1): Pt will initiate standing with RW PT Short Term Goal 2 (Week 1): Pt will tolerate sitting OOB between sessions PT Short Term Goal 3 (Week 1): Pt will tolerate x 5 min therapeutic exercise  Skilled Therapeutic Interventions/Progress Updates: Pt presented in bed agreeable to therapy. Pt required re-orientation to place as pt states "I'm going to be taken upstairs today". Pt also noted did not eat food as was "cold", pt did not want fresh hot plate but was able to drink some supplement (Nepro)during session. Pt did c/o L foot plain, states due to catheter, educated pt that foley line is not near ankle. Pt was TTP medial/lateral aspect of ankle with no edema noted at time. Pt was able to tolerate wt bearing on LLE during session. PTA donned TED hose and shoes in bed total A. Pt performed supine to sit with minA and use of bed features with pt able to manage BLE independently but required assist for truncal support. Pt required modA for scoot to EOB with cues for sequencing. Pt c/o dizziness upon sitting vitals checked BP 155/91 (109) HR 99 SpO2 97. Pt able to sit EOB with supervision while using washcloth to clean mouth/nose (due to [place]). With significant increased time pt performed Sit to stand from elevated bed with minA x 2. Pt transferred to w/c and transported to day room. Participated in Cybex Kinetron 60cm/sec 3 x 2 min intervals. Pt required intermittent cues for sustained task as well and to perform reciprocal pattern. Pt transported back to room at end of session and left with belt alarm on, call bell within reach and needs met.   Tx2: Pt presented in bed having just returned from x-ray. Pt  frustrated that has not been able to eat today due to scheduling conflicts (pt's lunch tray was untouched on tray). PTA discussing with nsg and advised pt had no restrictions with what she could eat as long as dysphasia 3. PTA obtained ice cream and pt performed supine to sit with minA and use of bed features. Pt sat EOB with supervision and ate 1.5 cups of ice cream with no s/s aspiration. Pt then encouraged to stand and step towards HOB vs laying down and scooting. Pt performed Sit to stand with minA and bed slightly elevated. With cues pt was able to take x 5 steps towards [facility] with minA and increased effort. Pt required cues for hand placement for both sit and stand but was able to return to supine with supervision and use of bed features with bed elevated to 15 degrees. Pt then repositioned to comfort and left with bed alarm on, 4 rails up per pt request and current needs met.      Therapy Documentation Precautions:  Precautions Precautions: Fall, Other (comment) Precaution Comments: cortrak; right leg discomfort - PTA Restrictions Weight Bearing Restrictions: No General: PT Amount of Missed Time (min): 30 Minutes PT Missed Treatment Reason: Xray Vital Signs:  Pain: Pain Assessment Pain Scale: 0-10 Pain Score: 0-No pain Mobility:   Locomotion :    Trunk/Postural Assessment :    Balance:   Exercises:   Other Treatments:      Therapy/Group: Individual Therapy  Dannielle Baskins 12/06/2021, 9:22  AM

## 2021-12-07 LAB — RENAL FUNCTION PANEL
Albumin: 2.7 g/dL — ABNORMAL LOW (ref 3.5–5.0)
Anion gap: 12 (ref 5–15)
BUN: 21 mg/dL (ref 8–23)
CO2: 25 mmol/L (ref 22–32)
Calcium: 8.8 mg/dL — ABNORMAL LOW (ref 8.9–10.3)
Chloride: 101 mmol/L (ref 98–111)
Creatinine, Ser: 4.17 mg/dL — ABNORMAL HIGH (ref 0.44–1.00)
GFR, Estimated: 11 mL/min — ABNORMAL LOW (ref >60)
Glucose, Bld: 151 mg/dL — ABNORMAL HIGH (ref 70–99)
Phosphorus: 4.5 mg/dL (ref 2.5–4.6)
Potassium: 3.7 mmol/L (ref 3.5–5.1)
Sodium: 138 mmol/L (ref 135–145)

## 2021-12-07 LAB — BILIRUBIN, TOTAL: Total Bilirubin: 1 mg/dL (ref 0.3–1.2)

## 2021-12-07 LAB — CBC WITH DIFFERENTIAL/PLATELET
Abs Immature Granulocytes: 0.03 10*3/uL (ref 0.00–0.07)
Basophils Absolute: 0.1 10*3/uL (ref 0.0–0.1)
Basophils Relative: 1 %
Eosinophils Absolute: 0.1 10*3/uL (ref 0.0–0.5)
Eosinophils Relative: 2 %
HCT: 25.7 % — ABNORMAL LOW (ref 36.0–46.0)
Hemoglobin: 8.4 g/dL — ABNORMAL LOW (ref 12.0–15.0)
Immature Granulocytes: 0 %
Lymphocytes Relative: 14 %
Lymphs Abs: 1.3 10*3/uL (ref 0.7–4.0)
MCH: 29.1 pg (ref 26.0–34.0)
MCHC: 32.7 g/dL (ref 30.0–36.0)
MCV: 88.9 fL (ref 80.0–100.0)
Monocytes Absolute: 1 10*3/uL (ref 0.1–1.0)
Monocytes Relative: 11 %
Neutro Abs: 6.6 10*3/uL (ref 1.7–7.7)
Neutrophils Relative %: 72 %
Platelets: 110 10*3/uL — ABNORMAL LOW (ref 150–400)
RBC: 2.89 MIL/uL — ABNORMAL LOW (ref 3.87–5.11)
RDW: 15.1 % (ref 11.5–15.5)
WBC: 9.1 10*3/uL (ref 4.0–10.5)
nRBC: 0 % (ref 0.0–0.2)

## 2021-12-07 LAB — ALT: ALT: 28 U/L (ref 0–44)

## 2021-12-07 LAB — ALKALINE PHOSPHATASE: Alkaline Phosphatase: 144 U/L — ABNORMAL HIGH (ref 38–126)

## 2021-12-07 LAB — GLUCOSE, CAPILLARY
Glucose-Capillary: 150 mg/dL — ABNORMAL HIGH (ref 70–99)
Glucose-Capillary: 216 mg/dL — ABNORMAL HIGH (ref 70–99)

## 2021-12-07 LAB — PROTEIN, TOTAL: Total Protein: 6.5 g/dL (ref 6.5–8.1)

## 2021-12-07 LAB — AST: AST: 27 U/L (ref 15–41)

## 2021-12-07 LAB — BILIRUBIN, DIRECT: Bilirubin, Direct: 0.2 mg/dL (ref 0.0–0.2)

## 2021-12-07 MED ORDER — OSMOLITE 1.5 CAL PO LIQD
975.0000 mL | ORAL | Status: DC
  Administered 2021-12-07 – 2021-12-08 (×2): 1000 mL
  Administered 2021-12-09 – 2021-12-10 (×3): 975 mL
  Administered 2021-12-10 – 2021-12-12 (×2): 1000 mL
  Administered 2021-12-13 – 2021-12-14 (×2): 975 mL
  Administered 2021-12-14 – 2021-12-16 (×2): 1000 mL
  Administered 2021-12-16: 975 mL
  Administered 2021-12-17 (×2): 1000 mL
  Administered 2021-12-18 – 2021-12-21 (×4): 975 mL
  Administered 2021-12-22: 1000 mL
  Administered 2021-12-23: 975 mL
  Administered 2021-12-24: 1000 mL
  Filled 2021-12-07 (×8): qty 1000

## 2021-12-07 MED ORDER — AMLODIPINE BESYLATE 5 MG PO TABS
5.0000 mg | ORAL_TABLET | Freq: Every day | ORAL | Status: DC
  Administered 2021-12-07 – 2021-12-10 (×4): 5 mg via ORAL
  Filled 2021-12-07 (×4): qty 1

## 2021-12-07 MED ORDER — PROSOURCE TF PO LIQD
45.0000 mL | Freq: Two times a day (BID) | ORAL | Status: DC
  Administered 2021-12-07 – 2022-01-02 (×48): 45 mL
  Filled 2021-12-07 (×43): qty 45

## 2021-12-07 MED ORDER — HEPARIN SODIUM (PORCINE) 1000 UNIT/ML IJ SOLN
3200.0000 [IU] | Freq: Once | INTRAMUSCULAR | Status: DC
Start: 2021-12-07 — End: 2021-12-25
  Filled 2021-12-07: qty 4
  Filled 2021-12-07: qty 3.2

## 2021-12-07 MED ORDER — SORBITOL 70 % SOLN
30.0000 mL | Freq: Once | Status: AC
  Administered 2021-12-07: 30 mL via ORAL
  Filled 2021-12-07: qty 30

## 2021-12-07 MED ORDER — ONDANSETRON HCL 4 MG PO TABS
4.0000 mg | ORAL_TABLET | Freq: Three times a day (TID) | ORAL | Status: DC | PRN
  Administered 2021-12-07 – 2021-12-18 (×4): 4 mg via ORAL
  Filled 2021-12-07 (×4): qty 1

## 2021-12-07 NOTE — Progress Notes (Signed)
[facility] Progress Note     Assessment/ Plan:   AKI secondary to ATN in the setting of cardiac arrest, hypotension, pressors, contrast.  BL CKD3A with BL creatinine in the 1.1-1.3 range in late 2021.  Has been RRT dependent since 1/4 - this was discussed with the patient and the family on 1/29.  Because they noted she made some urine output plans for outpatient dialysis and permanent access had been delayed.  She last had dialysis on 2/1.  Urine output has been higher this past week but not well documented at times. (Post void?) Bladder scan 2/6 with zero mL - HD per MWF schedule.  Rehab has asked for schedule .They plan therapy schedule a day in advance  - strict ins/outs re-ordered - She has a spot at [facility] for 2nd shift.  CLIPPED as AKI.  Overall urine output increase is encouraging.  However note duration of AKI - started CRRT on 1/4.     - appreciate reduction in gabapentin  - she has a foley     VF cardiac arrest--> OOH VF arrest 2/2 massive PE  HTN - start amlodipine 5 mg qHS   Massive PE s/p mechanical thrombectomy 1/2 of R and L PA.  Vasc US- no DVT, liver mass noted 10/28/21 CT abd/ pelvis, s/p MRI abd--> incompletely characterized, had MRI with gadolinium 1/20  to reevaluate -- not definitive. S/p hem/onc eval   Retroperitoneal bleed/Hemorrhagic shock/Acute blood loss anemia originating from left liver lobe s/p embolization 1/4--> required FFP, plts, Vit K, pRBCs; now stable. Hematology following - no ESA due to concern for her recent thromboembolic disease. Transfuse when needed   Thrombocytopenia- suspected ITP, s/p Nplate PRN and dex. -  work up and mgmt per heme.  Overall improved  Acute hypoxic RF- extubated 11/07/21, now much improved  Sepsis/ leukocytosis/ aspiration pneumonia- MRI with aspiration PNA.  S/p  CTX and vanc   Metabolic bone disease - hyperphos resolved with HD-   PTH 129  10. Liver lesions - per charting possible biopsy once patient is  more stable  11. PSA uti: present on culture. Pan sensitive. Treatment per primary  Disposition - she is in inpatient rehab.  We are monitoring for recovery of AKI    Subjective:    Seen and examined on dialysis.  Blood pressure 163/86 and HR 97.  Tolerating goal.  Procedure supervised.  Tunn catheter in use.  She had 2.3 liters UOP over 2/9.  She's tired but excited to hear this    Review of systems:  Denies shortness of breath or chest pain  Denies n/v    Objective:   BP (!) 163/87    Pulse 97    Temp 98.2 F (36.8 C) (Oral)    Resp 13    Ht 5\' 5"  (1.651 m)    Wt 68.5 kg    SpO2 100%    BMI 25.13 kg/m   Intake/Output Summary (Last 24 hours) at 12/07/2021 1644 Last data filed at 12/07/2021 0900 Gross per 24 hour  Intake 177 ml  Output 1400 ml  Net -1223 ml   Weight change: -2.4 kg  Physical Exam:    KVQ:[id number] female in bed in NAD HEENT:  NCAT LUNGS: clear to auscultation and normal work of breathing at rest on room air CV: S1S2 no rub ABD: soft, nontender/nt EXT: Warm and well perfused, no edema Psych normal mood and affect Neuro awake and conversant, follows commands Gu - foley catheter in place  Acces: RIJ tunn dialysis catheter  Imaging: DG Abd 1 View  Result Date: 12/06/2021 CLINICAL DATA:  Constipation EXAM: ABDOMEN - 1 VIEW COMPARISON:  12/02/2021 FINDINGS: Enteric tube tip is at the distal stomach. Bowel gas pattern is unremarkable. Stool burden is mild. IMPRESSION: Unremarkable bowel gas pattern. Electronically Signed   By: Macy Mis M.D.   On: 12/06/2021 11:52   CT HEAD WO CONTRAST (5MM)  Result Date: 12/06/2021 CLINICAL DATA:  Delirium. Status post cardiac arrest. Increasing confusion. EXAM: CT HEAD WITHOUT CONTRAST TECHNIQUE: Contiguous axial images were obtained from the base of the skull through the vertex without intravenous contrast. RADIATION DOSE REDUCTION: This exam was performed according to the departmental dose-optimization program which  includes automated exposure control, adjustment of the mA and/or kV according to patient size and/or use of iterative reconstruction technique. COMPARISON:  CT brain 11/08/2021 FINDINGS: Brain: There is mild cortical atrophy, within normal limits for patient age. The ventricles are normal in configuration. The basilar cisterns are patent. No mass, mass effect, or midline shift. No acute intracranial hemorrhage is seen. No abnormal extra-axial fluid collection. There are atherosclerotic intracranial calcifications. Minimal periventricular white matter hypodensities are similar to prior, likely chronic ischemic white matter changes. Preservation of the normal cortical gray-white interface without CT evidence of an acute major vascular territorial cortical based infarction. Vascular: No hyperdense vessel or unexpected calcification. Skull: Normal. Negative for fracture or focal lesion. Sinuses/Orbits: The visualized orbits are unremarkable. The visualized paranasal sinuses and mastoid air cells are clear. Other: There is mildly increased density within the posterior inferior scalp at the level of the occiput in the region measuring up to 4 cm in transverse dimension and 3 cm in craniocaudal dimension, a possible contusion but nonspecific. IMPRESSION: No acute intracranial process. Increased density within the posterior inferior scalp at the level of the occiput in the region measuring up to 4 cm in transverse dimension and 3 cm in craniocaudal dimension, a possible contusion but nonspecific. This is new compared to 11/08/2021. Electronically Signed   By: Yvonne Kendall M.D.   On: 12/06/2021 15:20    Labs: BMET Recent Labs  Lab 12/01/21 0147 12/02/21 0159 12/03/21 0713 12/04/21 0105 12/05/21 0557 12/07/21 0525  NA 133* 132* 134* 134* 136 138  K 5.3* 4.9 5.3* 4.6 4.4 3.7  CL 97* 95* 95* 97* 99 101  CO2 24 27 27 28 24 25   GLUCOSE 182* 290* 98 129* 56* 151*  BUN 56* 29* 45* 53* 51* 21  CREATININE 5.02*  2.96* 4.45* 4.97* 5.74* 4.17*  CALCIUM 8.7* 8.2* 8.8* 8.8* 8.6* 8.8*  PHOS 3.0 3.2 4.3  --   --  4.5   CBC Recent Labs  Lab 12/03/21 0713 12/04/21 0105 12/05/21 0557 12/07/21 0525  WBC 9.7 9.4 9.6 9.1  NEUTROABS 7.1 6.7 6.9 6.6  HGB 7.3* 7.8* 7.4* 8.4*  HCT 23.0* 23.6* 23.1* 25.7*  MCV 90.2 88.7 88.8 88.9  PLT 78* 91* 105* 110*    Medications:     apixaban  5 mg Oral BID   atorvastatin  20 mg Oral Daily   B-complex with vitamin C  1 tablet Oral Daily   chlorhexidine  15 mL Mouth Rinse BID   Chlorhexidine Gluconate Cloth  6 each Topical Q0600   dicyclomine  10 mg Oral TID AC   DULoxetine  20 mg Oral Daily   feeding supplement  1 Container Oral BID BM   feeding supplement (NEPRO CARB STEADY)  237 mL Oral TID BM  feeding supplement (OSMOLITE 1.5 CAL)  975 mL Per Tube Q24H   feeding supplement (PROSource TF)  45 mL Per Tube BID   folic acid  2 mg Oral Daily   lidocaine  2 patch Transdermal Q24H   mupirocin cream  1 application Topical BID   pantoprazole  40 mg Oral BID    Claudia Desanctis, MD 12/07/2021 4:55 PM

## 2021-12-07 NOTE — Progress Notes (Signed)
Calorie Count Note  48 hour calorie count ordered.  Diet: Dysphagia 3 with thin liquids   Supplements:  - Nepro shake po TID, each supplement provides 425 kcal and 19 grams of protein - Boost Breeze po BID, each supplement provides 250 kcal and 9 grams of protein  Breakfast: 0% Lunch: 110 kcal, 0 grams of protein  Dinner: 102 kcal, 6 grams of protein  Supplements: 675 kcal, 28 grams of protein   Day 2 Total intake:  887 kcal (45% of minimum estimated needs)  34 grams of protein (34% of minimum estimated needs)  Estimated Nutritional Needs:  Kcal:  1950-2150 Protein:  100-125 grams Fluid:  1L + UOP   Per chart note, pt was encouraged to eat breakfast today as appeared untouched and pt only ate a few bites of banana only, per physical therapy assistant progress note. Dietetic intern and RD met with pt at bedside. Per pt, pt reports that she was experiencing nausea and vomiting this morning. Pt reports that the nausea has gotten somewhat better during the time of visit. Appetite remains poor. Pt was sitting up on side of bed with lunch tray in front of her that appeared untouched. Pt continues to have poor po intake at meals and refused her nutritional supplements this morning due to nausea and vomiting. Pt also reports some constipation and could not remember when she last had a BM.   RD, Colletta [place] spoke to [place], [place] after leaving pt's room and discussed starting nocturnal feedings for pt to help meet nutritional needs. Linna Hoff, PA agreeable to this plan.   Nutrition Dx: Increased nutrient needs related to chronic illness (renal failure on HD) as evidenced by estimated needs; ongoing  Goal: Patient will meet greater than or equal to 90% of their needs; progressing  Intervention:  Calorie count completed, will d/c  Continue Nepro Shake po TID, each supplement provides 425 kcal and 19 grams protein Continue Boost Breeze po BID, each supplement provides 250 kcal and 9 grams of  protein Encourage intake of meals and nutritional supplements  Initiate TF via Cortrak:  Osmolite 1.5 at 75 ml/h x 13 hours nocturnally (6pm-7am), Pro-source TF 45 ml BID. This provides 1543 kcal, 83 gm protein, 743 ml free water. This meets 79% of estimated calories needs and 83% of minimum estimated protein needs    Maryruth Hancock, Dietetic Intern 12/07/2021 3:27 PM

## 2021-12-07 NOTE — IPOC Note (Signed)
Overall Plan of Care [facility]) Patient Details Name: JORGE AMPARO MRN: [id number] DOB: 1951/05/13  Admitting Diagnosis: [place] [facility] Problems: Principal Problem:   Debility Active Problems:   Cardiac arrest ([place])   Acute saddle pulmonary embolism with acute cor pulmonale (HCC)   Acute on chronic renal failure (HCC)   Anoxia of brain ([place])     Functional Problem List: Nursing Edema, Endurance, Medication Management, Safety, Skin Integrity  PT Balance, Endurance, Motor, Pain, Edema, Safety, Skin Integrity  OT Balance, Skin Integrity, Cognition, Edema, Endurance, Motor, Pain, Safety  SLP Cognition, Linguistic, Nutrition  TR         Basic ADLs: OT Grooming, Bathing, Dressing, Toileting     Advanced  ADLs: OT       Transfers: PT Bed Mobility, Bed to Chair, Teacher, early years/pre, Tub/Shower     Locomotion: PT Ambulation, Wheelchair Mobility     Additional Impairments: OT None  SLP Swallowing, Communication, Social Cognition expression Social Interaction, Problem Solving, Memory, Attention, Awareness  TR      Anticipated Outcomes Item Anticipated Outcome  Self Feeding n/a  Swallowing  Mod I   Basic self-care  min A  Toileting  min A   Bathroom Transfers min A  Bowel/Bladder  n/a  Transfers  min A  Locomotion  min A household gait  Communication  Mod I  Cognition  Min A  Pain  n/a  Safety/Judgment  min assist and no falls   Therapy Plan: PT Intensity: Minimum of 1-2 x/day ,45 to 90 minutes PT Frequency: 5 out of 7 days PT Duration Estimated Length of Stay: 18-21 days OT Intensity: Minimum of 1-2 x/day, 45 to 90 minutes OT Frequency: 5 out of 7 days OT Duration/Estimated Length of Stay: ~3 Weeks SLP Intensity: Minumum of 1-2 x/day, 30 to 90 minutes SLP Frequency: 3 to 5 out of 7 days SLP Duration/Estimated Length of Stay: 18-21 days   Due to the current state of emergency, patients may not be receiving their 3-hours of Medicare-mandated  therapy.   Team Interventions: Nursing Interventions Patient/Family Education, Disease Management/Prevention, Medication Management, Skin Care/Wound Management, Discharge Planning  PT interventions Ambulation/gait training, Cognitive remediation/compensation, Discharge planning, DME/adaptive equipment instruction, Functional mobility training, Pain management, Psychosocial support, Splinting/orthotics, Therapeutic Activities, UE/LE Strength taining/ROM, Visual/perceptual remediation/compensation, Training and development officer, Community reintegration, Disease management/prevention, Functional electrical stimulation, Neuromuscular re-education, Patient/family education, Skin care/wound management, Stair training, Therapeutic Exercise, Wheelchair propulsion/positioning, UE/LE Coordination activities  OT Interventions Training and development officer, Neuromuscular re-education, Self Care/advanced ADL retraining, Therapeutic Exercise, Wheelchair propulsion/positioning, Cognitive remediation/compensation, DME/adaptive equipment instruction, Pain management, Skin care/wound managment, UE/LE Strength taining/ROM, Community reintegration, Barrister's clerk education, Splinting/orthotics, UE/LE Coordination activities, Discharge planning, Functional mobility training, Psychosocial support, Therapeutic Activities  SLP Interventions Cognitive remediation/compensation, Dysphagia/aspiration precaution training, Internal/external aids, Speech/Language facilitation, Cueing hierarchy, Environmental controls, Therapeutic Activities, Functional tasks, Patient/family education  TR Interventions    SW/CM Interventions Discharge Planning, Psychosocial Support, Patient/Family Education   Barriers to Discharge MD  Medical stability, Home enviroment access/loayout, Wound care, Lack of/limited family support, Weight, and Hemodialysis  Nursing Decreased caregiver support, Home environment access/layout, Wound Care, Weight, Hemodialysis,  Nutrition means, Medication compliance Multi-level apartment, 1 step entrance. 6 steps, landing, 6 steps to upper level. 2 daughters can provide assist at discharge.  PT Home environment access/layout, Decreased caregiver support, Hemodialysis, Nutrition means    OT      SLP      SW Decreased caregiver support, Lack of/limited family support     Team Discharge Planning: Destination:  PT-Home ,OT- Home , SLP-Home Projected Follow-up: PT-Home health PT, OT-  Home health OT, SLP-24 hour supervision/assistance, Home Health SLP, Outpatient SLP Projected Equipment Needs: PT-To be determined, OT- To be determined, SLP-None recommended by SLP Equipment Details: PT- , OT-  Patient/family involved in discharge planning: PT- Patient,  OT-Patient, SLP-Patient, Family member/caregiver  MD ELOS: 18-21 days Medical Rehab Prognosis:  Good Assessment: Pt is a 71 yr old female with vfib arrest and massive PE And ATN/AKI getting dialyzed- has Cortrak for poor appetite- Improved cognition today-  ITP and retroperitoneal hematoma-   Goals min A   See Team Conference Notes for weekly updates to the plan of care

## 2021-12-07 NOTE — Progress Notes (Signed)
Physical Therapy Session Note  ( Patient Details  Name: JOHNANNA BAKKE MRN: [id number] Date of Birth: 08/21/1951  Today's Date: 12/07/2021 PT Individual Time: 0805-0920 PT Individual Time Calculation (min): 75 min   Short Term Goals: Week 1:  PT Short Term Goal 1 (Week 1): Pt will initiate standing with RW PT Short Term Goal 2 (Week 1): Pt will tolerate sitting OOB between sessions PT Short Term Goal 3 (Week 1): Pt will tolerate x 5 min therapeutic exercise  Skilled Therapeutic Interventions/Progress Updates: Pt presented in bed with nsg present agreeable to therapy. Pt c/o pain in B ankles, monitored during session and rest breaks provided as needed. Pt encouraged to eat some breakfast as appeared untouched. Pt ate a few bites of banana only. PTA donned TED hose total A and pt performed supine to sit with CGA and increased time. Pt c/o dizziness upon sitting, vitals checked as noted below. At EOB pt doffed nightgown and donned shirt with supervision and increased time. PTA threaded pants and donned shoes total A. Pt sat EOB for extended period of time before performing [place] transfer to w/c. BP checked after transfer as noted below. Pt then transferred to rehab gym and performed x 1 stand and was able to take x 4 steps in parallel bars with minA. Pt noted to have increased tremors in BLE with steps but no knee buckling noted. Pt did indicate increased pain with RLE with standing. BP checked after mobility as noted below. As with standing in [place] pt required significantly increased time with performing Sit to stand. Pt returned to room at end of session and remained in w/c with belt alarm on, call bell within reach and nsg present to provide pain meds.   BP upon sitting 178/91 (121) BP after transfer to w/c 179/92 (116) BP after standing 175/95 (118)     Therapy Documentation Precautions:  Precautions Precautions: Fall, Other (comment) Precaution Comments: cortrak; right leg discomfort -  PTA Restrictions Weight Bearing Restrictions: No General:   Vital Signs:  Pain: Pain Assessment Pain Scale: 0-10 Pain Score: 4  Mobility:   Locomotion :    Trunk/Postural Assessment :    Balance:   Exercises:   Other Treatments:      Therapy/Group: Individual Therapy  [name] 12/07/2021, 12:26 PM

## 2021-12-07 NOTE — Progress Notes (Signed)
Occupational Therapy Session Note  Patient Details  Name: [name] MRN: [id number] Date of Birth: November 24, 1950  Today's Date: 12/07/2021 OT Individual Time: 9485-4627 OT Individual Time Calculation (min): 28 min  and Today's Date: 12/07/2021 OT Missed Time: 32 Minutes Missed Time Reason: Patient ill (comment) (n/v)   Short Term Goals: Week 1:  OT Short Term Goal 1 (Week 1): PT will performed sit to stand with mod A of +1 in prep for clothing managment for toileting/ dressing OT Short Term Goal 2 (Week 1): Pt will don shirt with min A in unsupported seated position OT Short Term Goal 3 (Week 1): Pt will be oriented x4 with min cues consistently  Skilled Therapeutic Interventions/Progress Updates:    Pt resting in w/c upon arrival with head resting on pillow placed in her lap. Pt responded to tactile and verbal cues. BP 164/84. Pt appeared to be in distress and finally stated she was nauseous. Emesis bag provided. Small amount of emesis. Pt remained in w/c . LPN [place] notifed. Pt unable to continue and missed 32 mins skilled OT services. Will check back later.  Therapy Documentation Precautions:  Precautions Precautions: Fall, Other (comment) Precaution Comments: cortrak; right leg discomfort - PTA Restrictions Weight Bearing Restrictions: No General: General OT Amount of Missed Time: 32 Minutes  Pain: Pt denies pain this morning but did c/o n/v    Therapy/Group: Individual Therapy  Leroy Libman 12/07/2021, 10:23 AM

## 2021-12-07 NOTE — Progress Notes (Signed)
PROGRESS NOTE   Subjective/Complaints:  Pt reports that she really want Cotrak removed.   Doesn't drink milk and won't drink juice due ot DM.   Feels more alert, and with it, however c/o more soreness today.  Knows me and role and in hospital- [name] is going to eat her entire banana, but no juice or milk.      ROS:  Pt denies SOB, abd pain, CP, N/V/C/D, and vision changes   Objective:   DG Abd 1 View  Result Date: 12/06/2021 CLINICAL DATA:  Constipation EXAM: ABDOMEN - 1 VIEW COMPARISON:  12/02/2021 FINDINGS: Enteric tube tip is at the distal stomach. Bowel gas pattern is unremarkable. Stool burden is mild. IMPRESSION: Unremarkable bowel gas pattern. Electronically Signed   By: Macy Mis M.D.   On: 12/06/2021 11:52   CT HEAD WO CONTRAST (5MM)  Result Date: 12/06/2021 CLINICAL DATA:  Delirium. Status post cardiac arrest. Increasing confusion. EXAM: CT HEAD WITHOUT CONTRAST TECHNIQUE: Contiguous axial images were obtained from the base of the skull through the vertex without intravenous contrast. RADIATION DOSE REDUCTION: This exam was performed according to the departmental dose-optimization program which includes automated exposure control, adjustment of the mA and/or kV according to patient size and/or use of iterative reconstruction technique. COMPARISON:  CT brain 11/08/2021 FINDINGS: Brain: There is mild cortical atrophy, within normal limits for patient age. The ventricles are normal in configuration. The basilar cisterns are patent. No mass, mass effect, or midline shift. No acute intracranial hemorrhage is seen. No abnormal extra-axial fluid collection. There are atherosclerotic intracranial calcifications. Minimal periventricular white matter hypodensities are similar to prior, likely chronic ischemic white matter changes. Preservation of the normal cortical gray-white interface without CT evidence of an acute major  vascular territorial cortical based infarction. Vascular: No hyperdense vessel or unexpected calcification. Skull: Normal. Negative for fracture or focal lesion. Sinuses/Orbits: The visualized orbits are unremarkable. The visualized paranasal sinuses and mastoid air cells are clear. Other: There is mildly increased density within the posterior inferior scalp at the level of the occiput in the region measuring up to 4 cm in transverse dimension and 3 cm in craniocaudal dimension, a possible contusion but nonspecific. IMPRESSION: No acute intracranial process. Increased density within the posterior inferior scalp at the level of the occiput in the region measuring up to 4 cm in transverse dimension and 3 cm in craniocaudal dimension, a possible contusion but nonspecific. This is new compared to 11/08/2021. Electronically Signed   By: Yvonne Kendall M.D.   On: 12/06/2021 15:20   Recent Labs    12/05/21 0557 12/07/21 0525  WBC 9.6 9.1  HGB 7.4* 8.4*  HCT 23.1* 25.7*  PLT 105* 110*   Recent Labs    12/05/21 0557 12/07/21 0525  NA 136 138  K 4.4 3.7  CL 99 101  CO2 24 25  GLUCOSE 56* 151*  BUN 51* 21  CREATININE 5.74* 4.17*  CALCIUM 8.6* 8.8*    Intake/Output Summary (Last 24 hours) at 12/07/2021 1308 Last data filed at 12/07/2021 0900 Gross per 24 hour  Intake 177 ml  Output 2300 ml  Net -2123 ml        Physical  Exam: Vital Signs Blood pressure (!) 178/95, pulse (!) 101, temperature 98.5 F (36.9 C), temperature source Oral, resp. rate 20, height 5\' 5"  (1.651 m), weight 72.3 kg, SpO2 100 %.     General: awake, alert, appropriate, sitting up in bed; eating banana; more vigorous; NAD HENT: conjugate gaze; oropharynx moist- cortrak in place; small scb felt in occiput- but think she has bigger one, but couldn't see in matted hair.  CV: regular rate; no JVD Pulmonary: CTA B/L; no W/R/R- good air movement GI: soft, NT, ND, (+)BS Psychiatric: appropriate- asking more questions- more  interactive Neurological: Ox2-3 today- more alert; more oriented Genitourinary:    Comments: Foley tube in place- medium amber urine in bag Musculoskeletal:     Cervical back: Neck supple. No tenderness.     Comments: UE 5-/5 B/L  LE 3/5 HF/KE- DF 4-/5 on L and 2-/5 on R; PF 4-/5 B/L  R foot drop- at rest in PF   Skin:    General: Skin is warm and dry.     Comments: R chest HD catheter  Neuro: Mild tremors with movement Numb/absent/decreased sensation from mid calf to toes B/L L>R Vague- Ox2 with cues   Assessment/Plan: 1. Functional deficits which require 3+ hours per day of interdisciplinary therapy in a comprehensive inpatient rehab setting. Physiatrist is providing close team supervision and 24 hour management of active medical problems listed below. Physiatrist and rehab team continue to assess barriers to discharge/monitor patient progress toward functional and medical goals  Care Tool:  Bathing    Body parts bathed by patient: Right arm, Left arm, Chest, Right upper leg, Left upper leg, Face   Body parts bathed by helper: Abdomen, Front perineal area, Buttocks, Right lower leg, Left lower leg     Bathing assist Assist Level: Maximal Assistance - Patient 24 - 49%     Upper Body Dressing/Undressing Upper body dressing   What is the patient wearing?: Pull over shirt    Upper body assist Assist Level: Moderate Assistance - Patient 50 - 74%    Lower Body Dressing/Undressing Lower body dressing      What is the patient wearing?: Pants, Underwear/pull up     Lower body assist Assist for lower body dressing: Moderate Assistance - Patient 50 - 74%     Toileting Toileting Toileting Activity did not occur (Clothing management and hygiene only): N/A (no void or bm)  Toileting assist Assist for toileting: Total Assistance - Patient < 25%     Transfers Chair/bed transfer  Transfers assist     Chair/bed transfer assist level: Dependent - mechanical lift  (stedy)     Locomotion Ambulation   Ambulation assist   Ambulation activity did not occur: Safety/medical concerns          Walk 10 feet activity   Assist  Walk 10 feet activity did not occur: Safety/medical concerns        Walk 50 feet activity   Assist Walk 50 feet with 2 turns activity did not occur: Safety/medical concerns         Walk 150 feet activity   Assist Walk 150 feet activity did not occur: Safety/medical concerns         Walk 10 feet on uneven surface  activity   Assist Walk 10 feet on uneven surfaces activity did not occur: Safety/medical concerns         Wheelchair     Assist Is the patient using a wheelchair?: Yes Type of Wheelchair: Manual  Wheelchair assist level: Dependent - Patient 0%      Wheelchair 50 feet with 2 turns activity    Assist        Assist Level: Dependent - Patient 0%   Wheelchair 150 feet activity     Assist      Assist Level: Dependent - Patient 0%   Blood pressure (!) 178/95, pulse (!) 101, temperature 98.5 F (36.9 C), temperature source Oral, resp. rate 20, height 5\' 5"  (1.651 m), weight 72.3 kg, SpO2 100 %.  Medical Problem List and Plan: 1. Functional deficits secondary to debility related to VF cardiac arrest secondary to massive pulmonary emboli status post mechanical thrombectomy             -patient may not shower due to HD catheter- unless it's removed             -ELOS/Goals: 10-14 days supervision to min A for PT/OT SLP  Continue CIR- PT, OT and SLP- IPOC today 2.  Antithrombotics: -DVT/anticoagulation:  Pharmaceutical: Other (comment) Eliquis             -antiplatelet therapy: N/A 3. Pain Management: Neurontin 100 mg twice daily, oxycodone as needed  2/9- had been on gabapentin for nerve pain-will stop due to sedation- will start RLE lidoderm patch 2 patches for pain.  4. Mood anxiety-  Cymbalta 20 mg daily, Xanax 0.25 mg daily as needed provide emotional support              -antipsychotic agents: N/A 5. Neuropsych: This patient is? capable of making decisions on her own behalf. 6. Skin/Wound Care: Routine skin checks 7. Fluids/Electrolytes/Nutrition: Routine in and outs with follow-up chemistries 8.  Retroperitoneal bleed/hemorrhagic shock/acute blood loss anemia/status post left liver lobe embolization  2/9- will check LFTs tomorrow to make sure liver function doing OK? 9.  AKI secondary to ATN.  Follow-up per renal services.  Started clip for AKI- not ESRD, however Cr almost 5 today- getting HD tomorrow 2/8.   2/9- last Cr 5.74- up from 4.97 up from 4.45- rising- per renal- likely HD tomorrow?.   2/10- Last Cr 4.71- per renal 10.  Thrombocytopenia.  Suspect ITP.  Follow-up per hematology  2/9- plts 105k- will con't to monitor 11.  Hyperlipidemia.  Lipitor 12. Diabetes- Poor appetite- has Cortrak for intake due to poor appetite-stop insulin- was on at home- levemir 4 units BID when came to rehab, but dropping CBGs- so will hold- will do CBGs BID Will assess if still needs- d/c insulin due to TFs 13. New onset confusion/poor memory  2/9- will check CT of head- and stop Gabapentin for nerve pain due to increased sedation- had before but so out of it per daughter- much worse last 2 days. If negative, will order MRI, since much worse cognitively.  14. ABLA with chronic anemia  2/9- Hb dropped again from 7.8 to 7.4- wil monitor and recheck regularly- will likely need transfusion.  15. Scab on back of head-  2/9- on the back of her head  2/10- will need to have hair washed to see if we can see wound that's on CT 16. HTN with hx of VF arrest  2/10- pt's BP 178/95- running 409W/119J- systolic- will d/w renal since not on any BP meds-    I spent a total of 36    minutes on total care today- >50% coordination of care- due to IPOC and d/w WOC as well as renal .   LOS: 3 days A FACE  TO FACE EVALUATION WAS PERFORMED  [name] 12/07/2021, 1:08 PM

## 2021-12-07 NOTE — Progress Notes (Signed)
Physical Therapy Session Note  Patient Details  Name: [name] MRN: [id number] Date of Birth: [birth year]  Today's Date: 12/07/2021 PT Individual Time: 1052-1109 PT Individual Time Calculation (min): 17 min   Short Term Goals: Week 1:  PT Short Term Goal 1 (Week 1): Pt will initiate standing with RW PT Short Term Goal 2 (Week 1): Pt will tolerate sitting OOB between sessions PT Short Term Goal 3 (Week 1): Pt will tolerate x 5 min therapeutic exercise  Skilled Therapeutic Interventions/Progress Updates:    Pt in w/c on arrival, expressing feeling intense nausea and dizziness. Attempted stand to RW, but ultimately required +2 to stand to stedy. Sit>supine with supervision. Pt remained in bed at end of session, was left with all needs in reach and alarm active. Pt missed 48 min of scheduled therapy d/t nausea.   Therapy Documentation Precautions:  Precautions Precautions: Fall, Other (comment) Precaution Comments: cortrak; right leg discomfort - PTA Restrictions Weight Bearing Restrictions: No General: PT Amount of Missed Time (min): 48 Minutes PT Missed Treatment Reason: Patient ill (Comment) (nausea)    Therapy/Group: Individual Therapy  Mickel Fuchs 12/07/2021, 12:46 PM

## 2021-12-08 LAB — RENAL FUNCTION PANEL
Albumin: 2.8 g/dL — ABNORMAL LOW (ref 3.5–5.0)
Anion gap: 12 (ref 5–15)
BUN: 18 mg/dL (ref 8–23)
CO2: 26 mmol/L (ref 22–32)
Calcium: 8.4 mg/dL — ABNORMAL LOW (ref 8.9–10.3)
Chloride: 98 mmol/L (ref 98–111)
Creatinine, Ser: 2.59 mg/dL — ABNORMAL HIGH (ref 0.44–1.00)
GFR, Estimated: 19 mL/min — ABNORMAL LOW (ref >60)
Glucose, Bld: 466 mg/dL — ABNORMAL HIGH (ref 70–99)
Phosphorus: 2.4 mg/dL — ABNORMAL LOW (ref 2.5–4.6)
Potassium: 3.9 mmol/L (ref 3.5–5.1)
Sodium: 136 mmol/L (ref 135–145)

## 2021-12-08 LAB — HEPATITIS B SURFACE ANTIGEN: Hepatitis B Surface Ag: NONREACTIVE

## 2021-12-08 LAB — GLUCOSE, CAPILLARY
Glucose-Capillary: 485 mg/dL — ABNORMAL HIGH (ref 70–99)
Glucose-Capillary: 482 mg/dL — ABNORMAL HIGH (ref 70–99)
Glucose-Capillary: 486 mg/dL — ABNORMAL HIGH (ref 70–99)
Glucose-Capillary: 212 mg/dL — ABNORMAL HIGH (ref 70–99)
Glucose-Capillary: 184 mg/dL — ABNORMAL HIGH (ref 70–99)
Glucose-Capillary: 225 mg/dL — ABNORMAL HIGH (ref 70–99)

## 2021-12-08 MED ORDER — SODIUM CHLORIDE 0.9 % IV SOLN: INTRAVENOUS | Status: AC

## 2021-12-08 MED ORDER — SENNA 8.6 MG PO TABS
2.0000 | ORAL_TABLET | Freq: Every day | ORAL | Status: DC
  Administered 2021-12-08 – 2022-01-07 (×31): 17.2 mg via ORAL
  Filled 2021-12-08 (×31): qty 2

## 2021-12-08 MED ORDER — MEGESTROL ACETATE 400 MG/10ML PO SUSP
800.0000 mg | Freq: Every day | ORAL | Status: DC
  Administered 2021-12-08 – 2022-01-07 (×30): 800 mg via ORAL
  Filled 2021-12-08 (×34): qty 20

## 2021-12-08 MED ORDER — INSULIN ASPART 100 UNIT/ML IJ SOLN
0.0000 [IU] | Freq: Every day | INTRAMUSCULAR | Status: DC
  Administered 2021-12-08: 2 [IU] via SUBCUTANEOUS
  Administered 2021-12-09: 3 [IU] via SUBCUTANEOUS
  Administered 2021-12-12: 4 [IU] via SUBCUTANEOUS
  Administered 2021-12-14: 5 [IU] via SUBCUTANEOUS
  Administered 2021-12-15: 3 [IU] via SUBCUTANEOUS

## 2021-12-08 MED ORDER — INSULIN ASPART 100 UNIT/ML IJ SOLN
0.0000 [IU] | Freq: Three times a day (TID) | INTRAMUSCULAR | Status: DC
  Administered 2021-12-08: 10 [IU] via SUBCUTANEOUS
  Administered 2021-12-08: 2 [IU] via SUBCUTANEOUS
  Administered 2021-12-08: 3 [IU] via SUBCUTANEOUS
  Administered 2021-12-09: 9 [IU] via SUBCUTANEOUS
  Administered 2021-12-09: 2 [IU] via SUBCUTANEOUS
  Administered 2021-12-09: 1 [IU] via SUBCUTANEOUS
  Administered 2021-12-10: 5 [IU] via SUBCUTANEOUS
  Administered 2021-12-10 – 2021-12-11 (×2): 3 [IU] via SUBCUTANEOUS
  Administered 2021-12-11: 7 [IU] via SUBCUTANEOUS
  Administered 2021-12-12 (×2): 1 [IU] via SUBCUTANEOUS
  Administered 2021-12-13: 2 [IU] via SUBCUTANEOUS
  Administered 2021-12-13: 1 [IU] via SUBCUTANEOUS
  Administered 2021-12-13 – 2021-12-14 (×2): 9 [IU] via SUBCUTANEOUS
  Administered 2021-12-14: 7 [IU] via SUBCUTANEOUS

## 2021-12-08 MED ORDER — INSULIN DETEMIR 100 UNIT/ML ~~LOC~~ SOLN
4.0000 [IU] | Freq: Two times a day (BID) | SUBCUTANEOUS | Status: DC
  Administered 2021-12-08 (×2): 4 [IU] via SUBCUTANEOUS
  Filled 2021-12-08 (×4): qty 0.04

## 2021-12-08 NOTE — Progress Notes (Signed)
[facility] Progress Note     Assessment/ Plan:   AKI secondary to ATN in the setting of cardiac arrest, hypotension, pressors, contrast.  BL CKD3A with BL creatinine in the 1.1-1.3 range in late 2021.  Has been RRT dependent since 1/4 - this was discussed with the patient and the family on 1/29.  Because they noted she made some urine output plans for outpatient dialysis and permanent access had been delayed.  She last had dialysis on 2/1.  Urine output has been higher this past week but not well documented at times. (Post void?) Bladder scan 2/6 with zero mL - HD per MWF schedule.  Rehab has asked for schedule .They plan therapy schedule a day in advance  - strict ins/outs  - NS 75 ml/hr x 12 hours today  - She has a spot at [facility] for 2nd shift.  CLIPPED as AKI.  Overall urine output increase is encouraging.  However note duration of AKI - started CRRT on 1/4.     - she has a foley     VF cardiac arrest--> OOH VF arrest 2/2 massive PE  HTN - started amlodipine 5 mg qHS on 2/10. improved  Massive PE s/p mechanical thrombectomy 1/2 of R and L PA.  Vasc US- no DVT, liver mass noted 10/28/21 CT abd/ pelvis, s/p MRI abd--> incompletely characterized, had MRI with gadolinium 1/20  to reevaluate -- not definitive. S/p hem/onc eval   Retroperitoneal bleed/Hemorrhagic shock/Acute blood loss anemia originating from left liver lobe s/p embolization 1/4--> required FFP, plts, Vit K, pRBCs; now stable. Hematology following - no ESA due to concern for her recent thromboembolic disease. Transfuse when needed   Thrombocytopenia- suspected ITP, s/p Nplate PRN and dex. -  work up and mgmt per heme.  Overall improved  Acute hypoxic RF- extubated 11/07/21, now much improved  Sepsis/ leukocytosis/ aspiration pneumonia- MRI with aspiration PNA.  S/p  CTX and vanc   Metabolic bone disease - hyperphos resolved with HD. Not on a binder. Slightly low.  PTH 129  10. Liver lesions - per past  charting possible biopsy once patient is more stable  11. PSA uti: present on culture. Pan sensitive. Treatment per primary  Disposition - she is in inpatient rehab.  We are monitoring for recovery of AKI    Subjective:    Last HD on 2/10 with 1 kg UF.  She had 0.3 liters UOP over 2/10 documented and has had 800 mL thus far today (had 2.3 liters UOP over 2/9 charted).  She feels alright.  Ok with getting fluids again today   Review of systems:   Denies shortness of breath or chest pain  Denies n/v    Objective:   BP (!) 155/89 (BP Location: Left Arm)    Pulse 88    Temp 97.7 F (36.5 C) (Oral)    Resp 16    Ht 5\' 5"  (1.651 m)    Wt 71.3 kg    SpO2 97%    BMI 26.16 kg/m   Intake/Output Summary (Last 24 hours) at 12/08/2021 1620 Last data filed at 12/08/2021 1500 Gross per 24 hour  Intake 2186 ml  Output 2100 ml  Net 86 ml   Weight change: -3.8 kg  Physical Exam:    ZOX:[id number] female in bed in NAD HEENT:  NCAT LUNGS: clear to auscultation and normal work of breathing at rest on room air CV: S1S2 no rub ABD: soft, nontender/nt EXT: Warm and well perfused,  no edema Psych normal mood and affect Neuro awake and conversant, follows commands Gu - foley catheter in place Acces: RIJ tunn dialysis catheter  Imaging: No results found.  Labs: BMET Recent Labs  Lab 12/02/21 0159 12/03/21 0713 12/04/21 0105 12/05/21 0557 12/07/21 0525 12/08/21 0527  NA 132* 134* 134* 136 138 136  K 4.9 5.3* 4.6 4.4 3.7 3.9  CL 95* 95* 97* 99 101 98  CO2 27 27 28 24 25 26   GLUCOSE 290* 98 129* 56* 151* 466*  BUN 29* 45* 53* 51* 21 18  CREATININE 2.96* 4.45* 4.97* 5.74* 4.17* 2.59*  CALCIUM 8.2* 8.8* 8.8* 8.6* 8.8* 8.4*  PHOS 3.2 4.3  --   --  4.5 2.4*   CBC Recent Labs  Lab 12/03/21 0713 12/04/21 0105 12/05/21 0557 12/07/21 0525  WBC 9.7 9.4 9.6 9.1  NEUTROABS 7.1 6.7 6.9 6.6  HGB 7.3* 7.8* 7.4* 8.4*  HCT 23.0* 23.6* 23.1* 25.7*  MCV 90.2 88.7 88.8 88.9  PLT 78* 91* 105*  110*    Medications:     amLODipine  5 mg Oral Q2000   apixaban  5 mg Oral BID   atorvastatin  20 mg Oral Daily   B-complex with vitamin C  1 tablet Oral Daily   chlorhexidine  15 mL Mouth Rinse BID   Chlorhexidine Gluconate Cloth  6 each Topical Q0600   dicyclomine  10 mg Oral TID AC   DULoxetine  20 mg Oral Daily   feeding supplement  1 Container Oral BID BM   feeding supplement (NEPRO CARB STEADY)  237 mL Oral TID BM   feeding supplement (OSMOLITE 1.5 CAL)  975 mL Per Tube Q24H   feeding supplement (PROSource TF)  45 mL Per Tube BID   folic acid  2 mg Oral Daily   heparin sodium (porcine)  3,200 Units Intracatheter Once   insulin aspart  0-5 Units Subcutaneous QHS   insulin aspart  0-9 Units Subcutaneous TID WC   insulin detemir  4 Units Subcutaneous BID   lidocaine  2 patch Transdermal Q24H   megestrol  800 mg Oral Daily   mupirocin cream  1 application Topical BID   pantoprazole  40 mg Oral BID   senna  2 tablet Oral Daily    Claudia Desanctis, MD 12/08/2021 4:33 PM

## 2021-12-08 NOTE — Progress Notes (Addendum)
Occupational Therapy Session Note  Patient Details  Name: [name] MRN: [id number] Date of Birth: [birth year]  Today's Date: 12/08/2021 OT Individual Time: 1110-1140 OT Individual Time Calculation (min): 30 min  OT missed time: 30 min Missed time reason: pain, faitgue, late to session due to toileting assist in previous session   Short Term Goals: Week 1:  OT Short Term Goal 1 (Week 1): PT will performed sit to stand with mod A of +1 in prep for clothing managment for toileting/ dressing OT Short Term Goal 2 (Week 1): Pt will don shirt with min A in unsupported seated position OT Short Term Goal 3 (Week 1): Pt will be oriented x4 with min cues consistently  Skilled Therapeutic Interventions/Progress Updates:  Skilled OT intervention completed with focus on toilet transfers, activity tolerance, bed mobility. Pt received on commode with rehab tech and NT with pt and stedy in front of pt upon therapist entrance. Pt reported needing time for BM, with close supervision provided, behind modesty door. Pt with c/o feeling dizzy while on commode, with therapist checking BP during session as follows: Seated on commode 135/90 Seated in stedy 149/89  Pt with mild shaking > in BUEs, with pt stating she sometimes has this when anxious or doing difficult tasks, with therapist educating pt on calming breathing techniques that can be utilized. During supervision pt found to be attempting to take off brief, with foley line being snagged/pulled. Therapist educated that brief needs to be unclasped to prevent pulling on the cord, and total A required to doff/donn clan brief. Pt reported being finished with pt able to assist with BUE on stedy, and mod A +2, with Max A required for toileting. Pt had no BM episode and was found to be clean upon pericare. Pt was able to maintain UE supported sitting in stedy upon transition to room, with CGA. Pt requesting to get back into bed due to pain and fatigue, with nurse  aware of pt status, pre-medicated. Pt able to stand from stedy seat with min A, then min A for descent to EOB. Pt able to lift BLEs into bed with min assist provided for foley line management. Pt was able to bridge and reposition herself for comfort with supervision. Pt was left upright in bed, with pillows under knees for comfort, bed alarm on and all needs in reach at end of session.     Therapy Documentation Precautions:  Precautions Precautions: Fall, Other (comment) Precaution Comments: cortrak; right leg discomfort - PTA Restrictions Weight Bearing Restrictions: No   Therapy/Group: Individual Therapy  Ankita Newcomer E Malik Ruffino 12/08/2021, 7:38 AM

## 2021-12-08 NOTE — Progress Notes (Signed)
Physical Therapy Session Note  Patient Details  Name: [name] MRN: [id number] Date of Birth: [birth year]  Today's Date: 12/08/2021 PT Individual Time: 0801-0856 PT Individual Time Calculation (min): 55 min   Short Term Goals: Week 1:  PT Short Term Goal 1 (Week 1): Pt will initiate standing with RW PT Short Term Goal 2 (Week 1): Pt will tolerate sitting OOB between sessions PT Short Term Goal 3 (Week 1): Pt will tolerate x 5 min therapeutic exercise  Skilled Therapeutic Interventions/Progress Updates:   Received pt hanging off off R side of bed. Therapist immediately helped reposition to middle of bed with max A and put up bedrail for safety. Notified RN to provide anxiety medication prior to starting session. Pt agreeable to PT treatment and sated "I dont know" when asked if she was in pain. RN arrived and therapist assisted RN with administering medications. Pt required significantly increased time to take medicine as pt extremely slow to initiate and required multimodal cues to open mouth, sip water, and swallow. Pt then c/o stomach pain and suddenly with 1 large emesis episode. Assisted RN and nursing student with cleanup and pt requested to wash legs and get pants on. Provided pt with washcloth, but pt unable to initiate/motor plan what to do, therefore washed/dried legs dependently and applied baby powder per pt request. Rolled L/R with mod A and cues for sequencing to check brief - pt found to be clean. Donned pants in supine with max A +2, however pt able to lift legs up to assist. Rolled L/R with mod A and use of bedrails and pulled pants over hips with +2 assist. Pt transferred supine<>sitting EOB from flat bed with mod A and use of bedrails. Pt required increased time sitting EOB due to reports of dizziness. Doffed soiled shirt and donned clean one with max A. Of note, pt required increased time with all mobility frequently telling therapist to "slow down". Sit<>stand in [place] with  mod A +2 and transferred dependently to WC. Concluded session with pt sitting in WC, needs within reach, and seatbelt alarm on. RN notified of pt's current status.   Therapy Documentation Precautions:  Precautions Precautions: Fall, Other (comment) Precaution Comments: cortrak; right leg discomfort - PTA Restrictions Weight Bearing Restrictions: No  Therapy/Group: Individual Therapy Alfonse Alpers PT, DPT   12/08/2021, 7:11 AM

## 2021-12-08 NOTE — Progress Notes (Signed)
Speech Language Pathology Daily Session Note  Patient Details  Name: [name] MRN: [id number] Date of Birth: 09-24-1951  Today's Date: 12/08/2021 SLP Individual Time: 1350-1425 SLP Individual Time Calculation (min): 35 min and Today's Date: 12/08/2021 SLP Missed Time: 25 Minutes Missed Time Reason: Patient fatigue  Short Term Goals: Week 1: SLP Short Term Goal 1 (Week 1): Patient will demonstrate sustained attention to functional tasks for 5 minutes with Mod verbal cues for redirection. SLP Short Term Goal 2 (Week 1): Patient will demonstrate functional problem solving for basic and familair tasks with Mod verbal and visual cues. SLP Short Term Goal 3 (Week 1): Patient will demonstrate orientation to place, time and situation with Mod verbal and visual cues. SLP Short Term Goal 4 (Week 1): Patient will identify 2 cognitive and 2 physical deficits with Mod multimodal cues. SLP Short Term Goal 5 (Week 1): Patient will consume current diet with minimal overt s/s of aspiration with supervision level verbal cues for use of swallowing compensatory strategies. SLP Short Term Goal 6 (Week 1): Patient will utilize word-finding strategies at the phrase level with Mod verbal cues in 50% of opportunities.  Skilled Therapeutic Interventions: Pt seen for skilled St with focus on cognitive goals, daughter present throughout. Pt very lethargic, reports of a bad monring with vomiting and high blood sugar. Tube feeds have restarted at this time which daughter states is difficult for pt to tolerate when they initiate. Pt rouses to voice, able to sustain attention for ~30 seconds before falling asleep. Pt able to answer simple yes/no questions and orientation concept questions with extra time and mod multimodal cues. Discussed goals of tx with daughter who is feeling very stressed with mother's current state and how to balance her care. Discussed current diet of Dys 3 2' edentulous status which daughter agrees  with (very poor PO intake despite daughter bringing in food from home, restaurants, etc). Pt not agreeable for PO trials at this time 2' fatigue. Daughter would like to inquire about appetite stimulant, SLP agreed to message MD in secure chat with question. Pt left in bed with alarm set and daughter present for needs.   Pain Pain Assessment Pain Scale: 0-10 Pain Score: 0-No pain  Therapy/Group: Individual Therapy  Dewaine Conger 12/08/2021, 2:16 PM

## 2021-12-08 NOTE — Progress Notes (Signed)
Patient BS 485 at [phone]. Called Dr. Dagoberto Ligas and obtained verbal order for levemir 4unit and sliding scale novolog - to give 10unit.

## 2021-12-08 NOTE — Progress Notes (Signed)
PROGRESS NOTE   Subjective/Complaints:  Pt has TF's started back by nutrition- so her CBGs this AM was 486- Is down to 212 after restring Levemir and giving SSI.  Feels like needs to have BM, but cannot.    ROS:  Pt denies SOB, abd pain, CP, N/V/(+)C/D, and vision changes   Objective:   CT HEAD WO CONTRAST (5MM)  Result Date: 12/06/2021 CLINICAL DATA:  Delirium. Status post cardiac arrest. Increasing confusion. EXAM: CT HEAD WITHOUT CONTRAST TECHNIQUE: Contiguous axial images were obtained from the base of the skull through the vertex without intravenous contrast. RADIATION DOSE REDUCTION: This exam was performed according to the departmental dose-optimization program which includes automated exposure control, adjustment of the mA and/or kV according to patient size and/or use of iterative reconstruction technique. COMPARISON:  CT brain 11/08/2021 FINDINGS: Brain: There is mild cortical atrophy, within normal limits for patient age. The ventricles are normal in configuration. The basilar cisterns are patent. No mass, mass effect, or midline shift. No acute intracranial hemorrhage is seen. No abnormal extra-axial fluid collection. There are atherosclerotic intracranial calcifications. Minimal periventricular white matter hypodensities are similar to prior, likely chronic ischemic white matter changes. Preservation of the normal cortical gray-white interface without CT evidence of an acute major vascular territorial cortical based infarction. Vascular: No hyperdense vessel or unexpected calcification. Skull: Normal. Negative for fracture or focal lesion. Sinuses/Orbits: The visualized orbits are unremarkable. The visualized paranasal sinuses and mastoid air cells are clear. Other: There is mildly increased density within the posterior inferior scalp at the level of the occiput in the region measuring up to 4 cm in transverse dimension and 3 cm  in craniocaudal dimension, a possible contusion but nonspecific. IMPRESSION: No acute intracranial process. Increased density within the posterior inferior scalp at the level of the occiput in the region measuring up to 4 cm in transverse dimension and 3 cm in craniocaudal dimension, a possible contusion but nonspecific. This is new compared to 11/08/2021. Electronically Signed   By: Yvonne Kendall M.D.   On: 12/06/2021 15:20   Recent Labs    12/07/21 0525  WBC 9.1  HGB 8.4*  HCT 25.7*  PLT 110*   Recent Labs    12/07/21 0525 12/08/21 0527  NA 138 136  K 3.7 3.9  CL 101 98  CO2 25 26  GLUCOSE 151* 466*  BUN 21 18  CREATININE 4.17* 2.59*  CALCIUM 8.8* 8.4*    Intake/Output Summary (Last 24 hours) at 12/08/2021 1230 Last data filed at 12/08/2021 0930 Gross per 24 hour  Intake 1093 ml  Output 2100 ml  Net -1007 ml        Physical Exam: Vital Signs Blood pressure (!) 159/73, pulse (!) 101, temperature 98.2 F (36.8 C), temperature source Oral, resp. rate 14, height 5\' 5"  (1.651 m), weight 71.3 kg, SpO2 98 %.      General: awake, alert, appropriate, sitting up in bed; NAD HENT: conjugate gaze; oropharynx moist CV: regular rate; no JVD Pulmonary: CTA B/L; no W/R/R- good air movement GI: soft, NT, slightly distended, (+)BS hypoactive Psychiatric: appropriate Neurological: more alert today- recognizes me Genitourinary:    Comments: Foley tube in place-  medium amber urine in bag Musculoskeletal:     Cervical back: Neck supple. No tenderness.     Comments: UE 5-/5 B/L  LE 3/5 HF/KE- DF 4-/5 on L and 2-/5 on R; PF 4-/5 B/L  R foot drop- at rest in PF   Skin:    General: Skin is warm and dry.     Comments: R chest HD catheter  Neuro: Mild tremors with movement Numb/absent/decreased sensation from mid calf to toes B/L L>R Vague- Ox2 with cues   Assessment/Plan: 1. Functional deficits which require 3+ hours per day of interdisciplinary therapy in a comprehensive  inpatient rehab setting. Physiatrist is providing close team supervision and 24 hour management of active medical problems listed below. Physiatrist and rehab team continue to assess barriers to discharge/monitor patient progress toward functional and medical goals  Care Tool:  Bathing    Body parts bathed by patient: Right arm, Left arm, Chest, Right upper leg, Left upper leg, Face   Body parts bathed by helper: Abdomen, Front perineal area, Buttocks, Right lower leg, Left lower leg     Bathing assist Assist Level: Maximal Assistance - Patient 24 - 49%     Upper Body Dressing/Undressing Upper body dressing   What is the patient wearing?: Pull over shirt    Upper body assist Assist Level: Moderate Assistance - Patient 50 - 74%    Lower Body Dressing/Undressing Lower body dressing      What is the patient wearing?: Pants, Underwear/pull up     Lower body assist Assist for lower body dressing: Moderate Assistance - Patient 50 - 74%     Toileting Toileting Toileting Activity did not occur Landscape architect and hygiene only): N/A (no void or bm)  Toileting assist Assist for toileting: Maximal Assistance - Patient 25 - 49%     Transfers Chair/bed transfer  Transfers assist     Chair/bed transfer assist level: Dependent - mechanical lift     Locomotion Ambulation   Ambulation assist   Ambulation activity did not occur: Safety/medical concerns          Walk 10 feet activity   Assist  Walk 10 feet activity did not occur: Safety/medical concerns        Walk 50 feet activity   Assist Walk 50 feet with 2 turns activity did not occur: Safety/medical concerns         Walk 150 feet activity   Assist Walk 150 feet activity did not occur: Safety/medical concerns         Walk 10 feet on uneven surface  activity   Assist Walk 10 feet on uneven surfaces activity did not occur: Safety/medical concerns         Wheelchair     Assist Is  the patient using a wheelchair?: Yes Type of Wheelchair: Manual    Wheelchair assist level: Dependent - Patient 0%      Wheelchair 50 feet with 2 turns activity    Assist        Assist Level: Dependent - Patient 0%   Wheelchair 150 feet activity     Assist      Assist Level: Dependent - Patient 0%   Blood pressure (!) 159/73, pulse (!) 101, temperature 98.2 F (36.8 C), temperature source Oral, resp. rate 14, height 5\' 5"  (1.651 m), weight 71.3 kg, SpO2 98 %.  Medical Problem List and Plan: 1. Functional deficits secondary to debility related to VF cardiac arrest secondary to massive pulmonary emboli status post  mechanical thrombectomy             -patient may not shower due to HD catheter- unless it's removed             -ELOS/Goals: 10-14 days supervision to min A for PT/OT SLP  Continue CIR- PT, OT and SLP 2.  Antithrombotics: -DVT/anticoagulation:  Pharmaceutical: Other (comment) Eliquis             -antiplatelet therapy: N/A 3. Pain Management: Neurontin 100 mg twice daily, oxycodone as needed  2/9- had been on gabapentin for nerve pain-will stop due to sedation- will start RLE lidoderm patch 2 patches for pain.  4. Mood anxiety-  Cymbalta 20 mg daily, Xanax 0.25 mg daily as needed provide emotional support             -antipsychotic agents: N/A 5. Neuropsych: This patient is? capable of making decisions on her own behalf. 6. Skin/Wound Care: Routine skin checks 7. Fluids/Electrolytes/Nutrition: Routine in and outs with follow-up chemistries 8.  Retroperitoneal bleed/hemorrhagic shock/acute blood loss anemia/status post left liver lobe embolization  2/9- will check LFTs tomorrow to make sure liver function doing OK?  2/11- LFTs look great- con't to monitor every so often 9.  AKI secondary to ATN.  Follow-up per renal services.  Started clip for AKI- not ESRD, however Cr almost 5 today- getting HD tomorrow 2/8.   2/9- last Cr 5.74- up from 4.97 up from 4.45-  rising- per renal- likely HD tomorrow?.   2/10- Last Cr 4.71- per renal  2/11- Cr 2.59 and BUN better at 18- GFR 19- per renal 10.  Thrombocytopenia.  Suspect ITP.  Follow-up per hematology  2/9- plts 105k- will con't to monitor 11.  Hyperlipidemia.  Lipitor 12. Diabetes- Poor appetite- has Cortrak for intake due to poor appetite-stop insulin- was on at home- levemir 4 units BID when came to rehab, but dropping CBGs- so will hold- will do CBGs BID Will assess if still needs- d/c insulin due to Tfs  2/11- TF's restarted so CBGs going up- 486 this AM- restarted Levemir 4 units BID and SSI- will titrate as required.   13. New onset confusion/poor memory  2/9- will check CT of head- and stop Gabapentin for nerve pain due to increased sedation- had before but so out of it per daughter- much worse last 2 days. If negative, will order MRI, since much worse cognitively.   2/11- much better today- con't to monitor 14. ABLA with chronic anemia  2/9- Hb dropped again from 7.8 to 7.4- wil monitor and recheck regularly- will likely need transfusion.  15. Scab on back of head-  2/9- on the back of her head  2/10- will need to have hair washed to see if we can see wound that's on CT 16. HTN with hx of VF arrest  2/10- pt's BP 178/95- running [id number]/384T- systolic- will d/w renal since not on any BP meds-   2/11- Started norvasc and BP looks better 364 systolic- will con't to monitor trend.    I spent a total of 41   minutes on total care today- >50% coordination of care- due to d/w nursing about CBGs- back on Tfs; and constipation.    LOS: 4 days A FACE TO FACE EVALUATION WAS PERFORMED  [name] 12/08/2021, 12:30 PM

## 2021-12-09 LAB — GLUCOSE, CAPILLARY
Glucose-Capillary: 411 mg/dL — ABNORMAL HIGH (ref 70–99)
Glucose-Capillary: 454 mg/dL — ABNORMAL HIGH (ref 70–99)
Glucose-Capillary: 144 mg/dL — ABNORMAL HIGH (ref 70–99)
Glucose-Capillary: 190 mg/dL — ABNORMAL HIGH (ref 70–99)
Glucose-Capillary: 267 mg/dL — ABNORMAL HIGH (ref 70–99)

## 2021-12-09 LAB — RENAL FUNCTION PANEL
Albumin: 2.6 g/dL — ABNORMAL LOW (ref 3.5–5.0)
Anion gap: 12 (ref 5–15)
BUN: 30 mg/dL — ABNORMAL HIGH (ref 8–23)
CO2: 24 mmol/L (ref 22–32)
Calcium: 8.4 mg/dL — ABNORMAL LOW (ref 8.9–10.3)
Chloride: 102 mmol/L (ref 98–111)
Creatinine, Ser: 3.56 mg/dL — ABNORMAL HIGH (ref 0.44–1.00)
GFR, Estimated: 13 mL/min — ABNORMAL LOW (ref >60)
Glucose, Bld: 425 mg/dL — ABNORMAL HIGH (ref 70–99)
Phosphorus: 3.1 mg/dL (ref 2.5–4.6)
Potassium: 4 mmol/L (ref 3.5–5.1)
Sodium: 138 mmol/L (ref 135–145)

## 2021-12-09 LAB — HEPATITIS B SURFACE ANTIBODY, QUANTITATIVE: Hep B S AB Quant (Post): 7.5 m[IU]/mL — ABNORMAL LOW (ref >9.9)

## 2021-12-09 MED ORDER — INSULIN DETEMIR 100 UNIT/ML ~~LOC~~ SOLN
8.0000 [IU] | Freq: Two times a day (BID) | SUBCUTANEOUS | Status: DC
  Administered 2021-12-09 – 2021-12-16 (×15): 8 [IU] via SUBCUTANEOUS
  Filled 2021-12-09 (×18): qty 0.08

## 2021-12-09 MED ORDER — CHLORHEXIDINE GLUCONATE CLOTH 2 % EX PADS: 6.0000 | MEDICATED_PAD | Freq: Every day | CUTANEOUS | Status: DC

## 2021-12-09 MED ORDER — SODIUM CHLORIDE 0.9 % IV SOLN: INTRAVENOUS | Status: AC

## 2021-12-09 NOTE — Progress Notes (Signed)
CBG was 454 this morning, MD notified and STAT lab ordered per protocol. Levemir increased from 4 units to 8 untis per verbal order by Dr. Dagoberto Ligas. Patient in bed with call bell within reach. Will continue with plan of care.

## 2021-12-09 NOTE — Progress Notes (Signed)
Patients daughter agreed to have patients hair cut to remove mats of hair in the back of patients head. Patient has scab to back of head. Cleansed with soap and water, pat dry and cleansed with betadine. IV .9 at 75 ml/hr running without difficulty. Coretrac patient Osmolite 1.5 running as ordered. Foley cath patent, clear yellow urine. CHG bath completed as ordered. HOB up 40 degrees call light within reach.

## 2021-12-09 NOTE — Progress Notes (Addendum)
[facility] Progress Note     Assessment/ Plan:   AKI secondary to ATN in the setting of cardiac arrest, hypotension, pressors, contrast.  BL CKD3A with BL creatinine in the 1.1-1.3 range in late 2021.  Has been RRT dependent since 1/4 - this was discussed with the patient and the family on 1/29.  Because they noted she made some urine output plans for outpatient dialysis and permanent access had been delayed.   - HD per MWF schedule.  Rehab has asked for schedule .They plan therapy schedule a day in advance  - strict ins/outs  - NS 75 ml/hr x 12 hours again today   - She has a spot at [facility] for 2nd shift.  CLIPPED as AKI.  Overall urine output increase is encouraging.  However note duration of AKI - started CRRT on 1/4.     - she has a foley     VF cardiac arrest--> OOH VF arrest 2/2 massive PE  HTN - started amlodipine 5 mg qHS on 2/10. improved  Massive PE s/p mechanical thrombectomy 1/2 of R and L PA.  Vasc US- no DVT, liver mass noted 10/28/21 CT abd/ pelvis, s/p MRI abd--> incompletely characterized, had MRI with gadolinium 1/20  to reevaluate -- not definitive. S/p hem/onc eval   Retroperitoneal bleed/Hemorrhagic shock/Acute blood loss anemia originating from left liver lobe s/p embolization 1/4--> required FFP, plts, Vit K, pRBCs; now stable. Hematology following - no ESA due to concern for her recent thromboembolic disease. Transfuse when needed   Thrombocytopenia- suspected ITP, s/p Nplate PRN and dex. -  work up and mgmt per heme.  Overall improved. CBC in am  Acute hypoxic RF- extubated 11/07/21, now much improved  Sepsis/ leukocytosis/ aspiration pneumonia- MRI with aspiration PNA.  S/p  CTX and vanc   Metabolic bone disease - hyperphos resolved with HD. Not on a binder. Slightly low.  PTH 129  10. Liver lesions - per past charting possible biopsy once patient is more stable  11. PSA uti: present on culture. Pan sensitive. Treatment per primary  12. Type  2 DM - per primary team. Uncontrolled. Currently on a dysphagia diet   Disposition - she is in inpatient rehab.  We are monitoring for recovery of AKI    Subjective:    Last HD on 2/10 with 1 kg UF.  She had 1.1 liters UOP over 2/11 documented.  She feels ok.  Not really in to football.   Review of systems:   Denies shortness of breath or chest pain  Denies n/v    Objective:   BP (!) 146/90 (BP Location: Left Arm)    Pulse 98    Temp 98.2 F (36.8 C) (Oral)    Resp 15    Ht 5\' 5"  (1.651 m)    Wt 71.3 kg    SpO2 98%    BMI 26.16 kg/m   Intake/Output Summary (Last 24 hours) at 12/09/2021 1603 Last data filed at 12/08/2021 9675 Gross per 24 hour  Intake 0 ml  Output 300 ml  Net -300 ml   Weight change:   Physical Exam:    FFM:[id number] female in bed in NAD HEENT:  NCAT LUNGS: clear to auscultation and normal work of breathing at rest on room air CV: S1S2 no rub ABD: soft, nontender/nt EXT: Warm and well perfused, no edema Psych normal mood and affect Neuro awake and conversant, follows commands; she is not able to provide year but agrees it is  2023. Does guess February.  Gu - foley catheter in place Acces: RIJ tunn dialysis catheter  Imaging: No results found.  Labs: BMET Recent Labs  Lab 12/03/21 0713 12/04/21 0105 12/05/21 0557 12/07/21 0525 12/08/21 0527 12/09/21 0610  NA 134* 134* 136 138 136 138  K 5.3* 4.6 4.4 3.7 3.9 4.0  CL 95* 97* 99 101 98 102  CO2 27 28 24 25 26 24   GLUCOSE 98 129* 56* 151* 466* 425*  BUN 45* 53* 51* 21 18 30*  CREATININE 4.45* 4.97* 5.74* 4.17* 2.59* 3.56*  CALCIUM 8.8* 8.8* 8.6* 8.8* 8.4* 8.4*  PHOS 4.3  --   --  4.5 2.4* 3.1   CBC Recent Labs  Lab 12/03/21 0713 12/04/21 0105 12/05/21 0557 12/07/21 0525  WBC 9.7 9.4 9.6 9.1  NEUTROABS 7.1 6.7 6.9 6.6  HGB 7.3* 7.8* 7.4* 8.4*  HCT 23.0* 23.6* 23.1* 25.7*  MCV 90.2 88.7 88.8 88.9  PLT 78* 91* 105* 110*    Medications:     amLODipine  5 mg Oral Q2000   apixaban  5 mg  Oral BID   atorvastatin  20 mg Oral Daily   B-complex with vitamin C  1 tablet Oral Daily   chlorhexidine  15 mL Mouth Rinse BID   Chlorhexidine Gluconate Cloth  6 each Topical Q0600   dicyclomine  10 mg Oral TID AC   DULoxetine  20 mg Oral Daily   feeding supplement  1 Container Oral BID BM   feeding supplement (NEPRO CARB STEADY)  237 mL Oral TID BM   feeding supplement (OSMOLITE 1.5 CAL)  975 mL Per Tube Q24H   feeding supplement (PROSource TF)  45 mL Per Tube BID   folic acid  2 mg Oral Daily   heparin sodium (porcine)  3,200 Units Intracatheter Once   insulin aspart  0-5 Units Subcutaneous QHS   insulin aspart  0-9 Units Subcutaneous TID WC   insulin detemir  8 Units Subcutaneous BID   lidocaine  2 patch Transdermal Q24H   megestrol  800 mg Oral Daily   mupirocin cream  1 application Topical BID   pantoprazole  40 mg Oral BID   senna  2 tablet Oral Daily    Claudia Desanctis, MD 12/09/2021 4:17 PM

## 2021-12-09 NOTE — Progress Notes (Signed)
PROGRESS NOTE   Subjective/Complaints:  LBM yesterday-  Not eating, but says will try.  Breakfast untouched in front of pt.   Nursing notes has wound, popped blister on L breast- Also they literally cannot assess wound on back of head due to matter hair- nursing to ask daughter if can cut out matter hair to assess wound.     ROS:  Pt denies SOB, abd pain, CP, N/V/C/D, and vision changes  Objective:   No results found. Recent Labs    12/07/21 0525  WBC 9.1  HGB 8.4*  HCT 25.7*  PLT 110*   Recent Labs    12/08/21 0527 12/09/21 0610  NA 136 138  K 3.9 4.0  CL 98 102  CO2 26 24  GLUCOSE 466* 425*  BUN 18 30*  CREATININE 2.59* 3.56*  CALCIUM 8.4* 8.4*    Intake/Output Summary (Last 24 hours) at 12/09/2021 1525 Last data filed at 12/08/2021 1852 Gross per 24 hour  Intake 0 ml  Output 300 ml  Net -300 ml        Physical Exam: Vital Signs Blood pressure (!) 146/90, pulse 98, temperature 98.2 F (36.8 C), temperature source Oral, resp. rate 15, height 5\' 5"  (1.651 m), weight 71.3 kg, SpO2 98 %.       General: awake, alert, appropriate, breakfast untouched in front of pt; NAD HENT: conjugate gaze; oropharynx moist; cortrak in place CV: regular rate; no JVD Pulmonary: CTA B/L; no W/R/R- good air movement GI: soft, NT, ND, (+)BS- hypoactive Psychiatric: appropriate- flat Neurological: delayed responses, but more alert;  Genitourinary:    Comments: Foley tube in place- medium amber urine in bag Musculoskeletal:     Cervical back: Neck supple. No tenderness.     Comments: UE 5-/5 B/L  LE 3/5 HF/KE- DF 4-/5 on L and 2-/5 on R; PF 4-/5 B/L  R foot drop- at rest in PF   Skin:    General: Skin is warm and dry.     Comments: R chest HD catheter  Neuro: Mild tremors with movement Numb/absent/decreased sensation from mid calf to toes B/L L>R Vague- Ox2 with cues   Assessment/Plan: 1. Functional  deficits which require 3+ hours per day of interdisciplinary therapy in a comprehensive inpatient rehab setting. Physiatrist is providing close team supervision and 24 hour management of active medical problems listed below. Physiatrist and rehab team continue to assess barriers to discharge/monitor patient progress toward functional and medical goals  Care Tool:  Bathing    Body parts bathed by patient: Right arm, Left arm, Chest, Right upper leg, Left upper leg, Face   Body parts bathed by helper: Abdomen, Front perineal area, Buttocks, Right lower leg, Left lower leg     Bathing assist Assist Level: Maximal Assistance - Patient 24 - 49%     Upper Body Dressing/Undressing Upper body dressing   What is the patient wearing?: Pull over shirt    Upper body assist Assist Level: Moderate Assistance - Patient 50 - 74%    Lower Body Dressing/Undressing Lower body dressing      What is the patient wearing?: Pants, Underwear/pull up     Lower body assist Assist  for lower body dressing: Moderate Assistance - Patient 50 - 74%     Toileting Toileting Toileting Activity did not occur Landscape architect and hygiene only): N/A (no void or bm)  Toileting assist Assist for toileting: Maximal Assistance - Patient 25 - 49%     Transfers Chair/bed transfer  Transfers assist     Chair/bed transfer assist level: Dependent - mechanical lift     Locomotion Ambulation   Ambulation assist   Ambulation activity did not occur: Safety/medical concerns          Walk 10 feet activity   Assist  Walk 10 feet activity did not occur: Safety/medical concerns        Walk 50 feet activity   Assist Walk 50 feet with 2 turns activity did not occur: Safety/medical concerns         Walk 150 feet activity   Assist Walk 150 feet activity did not occur: Safety/medical concerns         Walk 10 feet on uneven surface  activity   Assist Walk 10 feet on uneven surfaces  activity did not occur: Safety/medical concerns         Wheelchair     Assist Is the patient using a wheelchair?: Yes Type of Wheelchair: Manual    Wheelchair assist level: Dependent - Patient 0%      Wheelchair 50 feet with 2 turns activity    Assist        Assist Level: Dependent - Patient 0%   Wheelchair 150 feet activity     Assist      Assist Level: Dependent - Patient 0%   Blood pressure (!) 146/90, pulse 98, temperature 98.2 F (36.8 C), temperature source Oral, resp. rate 15, height 5\' 5"  (1.651 m), weight 71.3 kg, SpO2 98 %.  Medical Problem List and Plan: 1. Functional deficits secondary to debility related to VF cardiac arrest secondary to massive pulmonary emboli status post mechanical thrombectomy             -patient may not shower due to HD catheter- unless it's removed             -ELOS/Goals: 10-14 days supervision to min A for PT/OT SLP  Continue CIR- PT, OT and SLP 2.  Antithrombotics: -DVT/anticoagulation:  Pharmaceutical: Other (comment) Eliquis             -antiplatelet therapy: N/A 3. Pain Management: Neurontin 100 mg twice daily, oxycodone as needed  2/9- had been on gabapentin for nerve pain-will stop due to sedation- will start RLE lidoderm patch 2 patches for pain.   2/12- more alert off gabapentin in spite of being on at home prior for neuropathy 4. Mood anxiety-  Cymbalta 20 mg daily, Xanax 0.25 mg daily as needed provide emotional support             -antipsychotic agents: N/A 5. Neuropsych: This patient is? capable of making decisions on her own behalf. 6. Skin/Wound Care: Routine skin checks 7. Fluids/Electrolytes/Nutrition: Routine in and outs with follow-up chemistries 8.  Retroperitoneal bleed/hemorrhagic shock/acute blood loss anemia/status post left liver lobe embolization  2/9- will check LFTs tomorrow to make sure liver function doing OK?  2/11- LFTs look great- con't to monitor every so often 9.  AKI secondary to  ATN.  Follow-up per renal services.  Started clip for AKI- not ESRD, however Cr almost 5 today- getting HD tomorrow 2/8.   2/9- last Cr 5.74- up from 4.97 up from 4.45- rising-  per renal- likely HD tomorrow?.   2/12- Cr up to 3.56 today- per renal  10.  Thrombocytopenia.  Suspect ITP.  Follow-up per hematology  2/9- plts 105k- will con't to monitor 11.  Hyperlipidemia.  Lipitor 12. Diabetes- Poor appetite- has Cortrak for intake due to poor appetite-stop insulin- was on at home- levemir 4 units BID when came to rehab, but dropping CBGs- so will hold- will do CBGs BID Will assess if still needs- d/c insulin due to Tfs  2/11- TF's restarted so CBGs going up- 486 this AM- restarted Levemir 4 units BID and SSI- will titrate as required.   2/12- got called about Bgs in 400s- again this AM- due to TF's- will increase Levemir to 8 units BID and monitor-  13. New onset confusion/poor memory  2/9- will check CT of head- and stop Gabapentin for nerve pain due to increased sedation- had before but so out of it per daughter- much worse last 2 days. If negative, will order MRI, since much worse cognitively.   2/11- much better today- con't to monitor 14. ABLA with chronic anemia  2/9- Hb dropped again from 7.8 to 7.4- wil monitor and recheck regularly- will likely need transfusion.  15. Scab on back of head-  2/9- on the back of her head  2/10- will need to have hair washed to see if we can see wound that's on CT  2/12- nursing feels like hair needs ot be cut- will d/w daughter and see if they will allow to look at wound.  16. HTN with hx of VF arrest  2/10- pt's BP 178/95- running [id number]- systolic- will d/w renal since not on any BP meds-   2/11- Started norvasc and BP looks better 937 systolic- will con't to monitor trend.   2/12- BP 146/90- doing better- con't regimen  I spent a total of  35  minutes on total care today- >50% coordination of care- due to d/w nursing x3 about breast blister and wound  on back of head and eating. Also missed daughter- was there til 2pm, but daughter didn't arrive. Also spoke to nursing about CBGs as well.    LOS: 5 days A FACE TO FACE EVALUATION WAS PERFORMED  [name] 12/09/2021, 3:25 PM

## 2021-12-10 DIAGNOSIS — I469 Cardiac arrest, cause unspecified: Secondary | ICD-10-CM

## 2021-12-10 DIAGNOSIS — E119 Type 2 diabetes mellitus without complications: Secondary | ICD-10-CM

## 2021-12-10 DIAGNOSIS — I10 Essential (primary) hypertension: Secondary | ICD-10-CM

## 2021-12-10 DIAGNOSIS — D696 Thrombocytopenia, unspecified: Secondary | ICD-10-CM

## 2021-12-10 LAB — RENAL FUNCTION PANEL
Albumin: 2.9 g/dL — ABNORMAL LOW (ref 3.5–5.0)
Anion gap: 11 (ref 5–15)
BUN: 38 mg/dL — ABNORMAL HIGH (ref 8–23)
CO2: 24 mmol/L (ref 22–32)
Calcium: 8.8 mg/dL — ABNORMAL LOW (ref 8.9–10.3)
Chloride: 104 mmol/L (ref 98–111)
Creatinine, Ser: 3.92 mg/dL — ABNORMAL HIGH (ref 0.44–1.00)
GFR, Estimated: 12 mL/min — ABNORMAL LOW (ref >60)
Glucose, Bld: 260 mg/dL — ABNORMAL HIGH (ref 70–99)
Phosphorus: 3 mg/dL (ref 2.5–4.6)
Potassium: 3.9 mmol/L (ref 3.5–5.1)
Sodium: 139 mmol/L (ref 135–145)

## 2021-12-10 LAB — CBC
HCT: 25.4 % — ABNORMAL LOW (ref 36.0–46.0)
Hemoglobin: 8.2 g/dL — ABNORMAL LOW (ref 12.0–15.0)
MCH: 29.1 pg (ref 26.0–34.0)
MCHC: 32.3 g/dL (ref 30.0–36.0)
MCV: 90.1 fL (ref 80.0–100.0)
Platelets: 83 10*3/uL — ABNORMAL LOW (ref 150–400)
RBC: 2.82 MIL/uL — ABNORMAL LOW (ref 3.87–5.11)
RDW: 15.3 % (ref 11.5–15.5)
WBC: 12.5 10*3/uL — ABNORMAL HIGH (ref 4.0–10.5)
nRBC: 0 % (ref 0.0–0.2)

## 2021-12-10 LAB — GLUCOSE, CAPILLARY
Glucose-Capillary: 267 mg/dL — ABNORMAL HIGH (ref 70–99)
Glucose-Capillary: 209 mg/dL — ABNORMAL HIGH (ref 70–99)
Glucose-Capillary: 86 mg/dL (ref 70–99)
Glucose-Capillary: 197 mg/dL — ABNORMAL HIGH (ref 70–99)

## 2021-12-10 MED ORDER — MUPIROCIN 2 % EX OINT: TOPICAL_OINTMENT | Freq: Two times a day (BID) | CUTANEOUS | Status: DC

## 2021-12-10 NOTE — Progress Notes (Signed)
[facility] NEPHROLOGY PROGRESS NOTE  Assessment/ Plan:  #Acute kidney injury secondary due to ischemic ATN in the setting of cardiac arrest, shock and contrast.  She has underlying CKD with baseline creatinine level around 1.3 back in 2021.  She has been RRT dependent since 10/31/2021 which was discussed with the patient and family.  Now receiving HD per MWF schedule.  See already has a spot at [facility] TTS schedule second shift for AKI.  I will discontinue daily lab.  #V-fib cardiac arrest outside the hospital due to massive PE.  #Massive PE status post mechanical thrombectomy on 1/2. Vasc US- no DVT, liver mass noted 10/28/21 CT abd/ pelvis, s/p MRI abd--> incompletely characterized, had MRI with gadolinium 1/20  to reevaluate -- not definitive. S/p hem/onc eval.  # Retroperitoneal bleed/Hemorrhagic shock/Acute blood loss anemia originating from left liver lobe s/p embolization 1/4--> required FFP, plts, Vit K, pRBCs; now stable. Hematology following - no ESA due to concern for her recent thromboembolic disease. Transfuse when needed   # Thrombocytopenia- suspected ITP, s/p Nplate PRN and dex. -  work up and mgmt per heme.  # Acute hypoxic RF- extubated 11/07/21, now much improved  Subjective: Seen and examined.  Denies nausea, vomiting, chest pain or shortness of breath.  She has core track. Objective Vital signs in last 24 hours: Vitals:   12/09/21 1925 12/10/21 0355 12/10/21 0401 12/10/21 0730  BP: (!) 151/76  (!) 148/69 (!) 160/70  Pulse: 100  92 100  Resp: 16  18 14   Temp: 98.7 F (37.1 C)  98.4 F (36.9 C) 97.9 F (36.6 C)  TempSrc: Oral  Oral Oral  SpO2: 98%  100% 100%  Weight:  68 kg    Height:       Weight change:   Intake/Output Summary (Last 24 hours) at 12/10/2021 0954 Last data filed at 12/10/2021 0815 Gross per 24 hour  Intake --  Output 2175 ml  Net -2175 ml       Labs: Basic Metabolic Panel: Recent Labs  Lab 12/08/21 0527 12/09/21 0610  12/10/21 0516  NA 136 138 139  K 3.9 4.0 3.9  CL 98 102 104  CO2 26 24 24   GLUCOSE 466* 425* 260*  BUN 18 30* 38*  CREATININE 2.59* 3.56* 3.92*  CALCIUM 8.4* 8.4* 8.8*  PHOS 2.4* 3.1 3.0   Liver Function Tests: Recent Labs  Lab 12/04/21 0105 12/05/21 0557 12/07/21 0525 12/08/21 0527 12/09/21 0610 12/10/21 0516  AST 34 24 27  --   --   --   ALT 44 32 28  --   --   --   ALKPHOS 152* 136* 144*  --   --   --   BILITOT 0.6 0.8 1.0  --   --   --   PROT 6.5 6.3* 6.5  --   --   --   ALBUMIN 2.8* 2.6* 2.7* 2.8* 2.6* 2.9*   No results for input(s): LIPASE, AMYLASE in the last 168 hours. No results for input(s): AMMONIA in the last 168 hours. CBC: Recent Labs  Lab 12/04/21 0105 12/05/21 0557 12/07/21 0525 12/10/21 0516  WBC 9.4 9.6 9.1 12.5*  NEUTROABS 6.7 6.9 6.6  --   HGB 7.8* 7.4* 8.4* 8.2*  HCT 23.6* 23.1* 25.7* 25.4*  MCV 88.7 88.8 88.9 90.1  PLT 91* 105* 110* 83*   Cardiac Enzymes: No results for input(s): CKTOTAL, CKMB, CKMBINDEX, TROPONINI in the last 168 hours. CBG: Recent Labs  Lab 12/09/21 [phone]  12/09/21 1116 12/09/21 1850 12/09/21 2116 12/10/21 0558  GLUCAP 454* 144* 190* 267* 267*    Iron Studies: No results for input(s): IRON, TIBC, TRANSFERRIN, FERRITIN in the last 72 hours. Studies/Results: No results found.  Medications: Infusions:   Scheduled Medications:  amLODipine  5 mg Oral Q2000   apixaban  5 mg Oral BID   atorvastatin  20 mg Oral Daily   B-complex with vitamin C  1 tablet Oral Daily   chlorhexidine  15 mL Mouth Rinse BID   Chlorhexidine Gluconate Cloth  6 each Topical Q0600   dicyclomine  10 mg Oral TID AC   DULoxetine  20 mg Oral Daily   feeding supplement  1 Container Oral BID BM   feeding supplement (NEPRO CARB STEADY)  237 mL Oral TID BM   feeding supplement (OSMOLITE 1.5 CAL)  975 mL Per Tube Q24H   feeding supplement (PROSource TF)  45 mL Per Tube BID   folic acid  2 mg Oral Daily   heparin sodium (porcine)  3,200 Units  Intracatheter Once   insulin aspart  0-5 Units Subcutaneous QHS   insulin aspart  0-9 Units Subcutaneous TID WC   insulin detemir  8 Units Subcutaneous BID   lidocaine  2 patch Transdermal Q24H   megestrol  800 mg Oral Daily   mupirocin cream  1 application Topical BID   pantoprazole  40 mg Oral BID   senna  2 tablet Oral Daily    have reviewed scheduled and prn medications.  Physical Exam: General:NAD, comfortable, nasal feeding tube in place Heart:RRR, s1s2 nl Lungs:clear b/l, no crackle Abdomen:soft, Non-tender, non-distended Extremities:No edema Dialysis Access: Right IJ TDC  Kala Ambriz Prasad Jevon Shells 12/10/2021,9:54 AM  LOS: 6 days

## 2021-12-10 NOTE — Progress Notes (Signed)
Occupational Therapy Session Note  Patient Details  Name: [name] MRN: [id number] Date of Birth: 1951/07/18  Today's Date: 12/10/2021 OT Individual Time: 0802-0900 OT Individual Time Calculation (min): 58 min    Short Term Goals: Week 1:  OT Short Term Goal 1 (Week 1): PT will performed sit to stand with mod A of +1 in prep for clothing managment for toileting/ dressing OT Short Term Goal 2 (Week 1): Pt will don shirt with min A in unsupported seated position OT Short Term Goal 3 (Week 1): Pt will be oriented x4 with min cues consistently  Skilled Therapeutic Interventions/Progress Updates:  Pt greeted supine in bed  agreeable to OT intervention. Session focus on BADL reeducation, functional mobility, dynamic standing balance and decreasing overall caregiver burden. Pt completed supine>sit with CGA with MOD cues for sequencing and body mechanics. Pt reports already bathed this AM but wanted to get dressed. Pt completed UB dressing with MIN A with OH shirt. MAX A for LB dressing to stand with RW to pull pants up to waist line. MOD A +2 to stand with RW and MOD tactile and verbal cues for sequencing and body mechanics with pt leaning backwards needing assist to shift hips forward and elevate trunk into standing. Pt needed seated rest break after stand ~ 5 mins, SpO2 96% on RA HR 111 bpm. Utilized stedy to transfer to w/c with pt needing MOD A +1 to stand to stedy, total A transfer to w/c. Pt needed MOD verbal/visual cues to problem solve how to scoot back in w/c. Pt transported ot sink for seated grooming. Pt completed grooming tasks with supervision.  pt left up in w/c with alarm belt activated and all needs within reach.      At end of session pt upset that her breakfast was cold, offered to order a new tray or bring in other options from floor stock. Pt declined all options and asked to speak to the "supervisor" alerted the RN.                   Therapy Documentation Precautions:   Precautions Precautions: Fall, Other (comment) Precaution Comments: cortrak; right leg discomfort - PTA Restrictions Weight Bearing Restrictions: No  Pain: no pain reported during session     Therapy/Group: Individual Therapy  Corinne Ports [facility] 12/10/2021, 10:01 AM

## 2021-12-10 NOTE — Progress Notes (Signed)
Occupational Therapy Session Note  Patient Details  Name: VARVARA LEGAULT MRN: [id number] Date of Birth: 1951/09/15  Today's Date: 12/10/2021 OT Individual Time: 1130-1154 OT Individual Time Calculation (min): 24 min    Short Term Goals: Week 1:  OT Short Term Goal 1 (Week 1): PT will performed sit to stand with mod A of +1 in prep for clothing managment for toileting/ dressing OT Short Term Goal 2 (Week 1): Pt will don shirt with min A in unsupported seated position OT Short Term Goal 3 (Week 1): Pt will be oriented x4 with min cues consistently  Skilled Therapeutic Interventions/Progress Updates:   Pt received sitting in the w/c, shaking d/t being cold. She was given a blanket while discussing plan for session. Pt shaking her head no to all ideas from OT and stating she would only like to get back to bed. A stedy was obtained and she stood with mod A after mod cueing for body mechanics and technique. Increased time for initiation required. She was transferred to EOB after remaining perched for several minutes with cueing for upright posture. Mod A to doff shoes EOB. She was positioned in sidelying to allow posterior head wound to heal. Pt left with all needs met, bed alarm set.    Therapy Documentation Precautions:  Precautions Precautions: Fall, Other (comment) Precaution Comments: cortrak; right leg discomfort - PTA Restrictions Weight Bearing Restrictions: No  Therapy/Group: Individual Therapy  Curtis Sites 12/10/2021, 6:17 AM

## 2021-12-10 NOTE — Progress Notes (Signed)
Patients daughter states that writer told her that if she had a problem with the core trac that use the call light and staff would come down and check the core trac. Patients daughter Careers information officer said someone would come immediately, which was never told to daughter. Daughter also states that Probation officer said it wasn't her problem. Writer never told daughter someone would be down immediately. Writer told daughter if tube feed started beeping to call the desk immediately. Writer left patients room at approximately 1845 peg tube running and patent. Patients daughter called the front desk at approximately [phone] stating her mothers feeding tube was beeping. Writer went down at [employer] to help oncoming nurse and flush peg. Daughter stating yelling so you said someone would be down immediately do you call this immediately. Writer told daughter she could talk to the charge nurse if she was not happy.

## 2021-12-10 NOTE — Progress Notes (Addendum)
PROGRESS NOTE   Subjective/Complaints:  Sitting EOB about to get dressed with OT. Still not eating much  No new problems this morning.   ROS: Limited due to cognitive/behavioral   Objective:   No results found. Recent Labs    12/10/21 0516  WBC 12.5*  HGB 8.2*  HCT 25.4*  PLT 83*   Recent Labs    12/09/21 0610 12/10/21 0516  NA 138 139  K 4.0 3.9  CL 102 104  CO2 24 24  GLUCOSE 425* 260*  BUN 30* 38*  CREATININE 3.56* 3.92*  CALCIUM 8.4* 8.8*    Intake/Output Summary (Last 24 hours) at 12/10/2021 1322 Last data filed at 12/10/2021 0815 Gross per 24 hour  Intake 100 ml  Output 2175 ml  Net -2075 ml        Physical Exam: Vital Signs Blood pressure (!) 164/88, pulse (!) 102, temperature 98.5 F (36.9 C), temperature source Oral, resp. rate 16, height 5\' 5"  (1.651 m), weight 68 kg, SpO2 100 %.  Constitutional: No distress . Vital signs reviewed. HEENT: NCAT, EOMI, oral membranes moist, NGT Neck: supple Cardiovascular: RRR without murmur. No JVD    Respiratory/Chest: CTA Bilaterally without wheezes or rales. Normal effort    GI/Abdomen: BS +, non-tender, non-distended Ext: no clubbing, cyanosis, or edema Psych: flat but cooperative  Genitourinary:    Comments: Foley tube in place- medium clear, yellow urine in bag Musculoskeletal:     Cervical back: Neck supple. No tenderness.     Comments: UE 5-/5 B/L  LE 3/5 HF/KE- DF 4-/5 on L and 2-/5 on R; PF 4-/5 B/L  R foot drop- at rest in PF   Skin:    General: Skin is warm and dry. Wound on occiput/matted with hair    Comments: R chest HD catheter  Neuro: Mild tremors with movement. Reasonable sitting balance Numb/absent/decreased sensation from mid calf to toes B/L L>R Vague- Ox2 with cues   Assessment/Plan: 1. Functional deficits which require 3+ hours per day of interdisciplinary therapy in a comprehensive inpatient rehab setting. Physiatrist is  providing close team supervision and 24 hour management of active medical problems listed below. Physiatrist and rehab team continue to assess barriers to discharge/monitor patient progress toward functional and medical goals  Care Tool:  Bathing    Body parts bathed by patient: Right arm, Left arm, Chest, Right upper leg, Left upper leg, Face   Body parts bathed by helper: Abdomen, Front perineal area, Buttocks, Right lower leg, Left lower leg     Bathing assist Assist Level: Maximal Assistance - Patient 24 - 49%     Upper Body Dressing/Undressing Upper body dressing   What is the patient wearing?: Pull over shirt    Upper body assist Assist Level: Minimal Assistance - Patient > 75%    Lower Body Dressing/Undressing Lower body dressing      What is the patient wearing?: Pants     Lower body assist Assist for lower body dressing: Maximal Assistance - Patient 25 - 49%     Toileting Toileting Toileting Activity did not occur (Clothing management and hygiene only): N/A (no void or bm)  Toileting assist Assist for toileting:  Maximal Assistance - Patient 25 - 49%     Transfers Chair/bed transfer  Transfers assist     Chair/bed transfer assist level: Dependent - mechanical lift (stedy)     Locomotion Ambulation   Ambulation assist   Ambulation activity did not occur: Safety/medical concerns          Walk 10 feet activity   Assist  Walk 10 feet activity did not occur: Safety/medical concerns        Walk 50 feet activity   Assist Walk 50 feet with 2 turns activity did not occur: Safety/medical concerns         Walk 150 feet activity   Assist Walk 150 feet activity did not occur: Safety/medical concerns         Walk 10 feet on uneven surface  activity   Assist Walk 10 feet on uneven surfaces activity did not occur: Safety/medical concerns         Wheelchair     Assist Is the patient using a wheelchair?: Yes Type of  Wheelchair: Manual    Wheelchair assist level: Minimal Assistance - Patient > 75% Max wheelchair distance: 100'    Wheelchair 50 feet with 2 turns activity    Assist        Assist Level: Minimal Assistance - Patient > 75%   Wheelchair 150 feet activity     Assist      Assist Level: Dependent - Patient 0%   Blood pressure (!) 164/88, pulse (!) 102, temperature 98.5 F (36.9 C), temperature source Oral, resp. rate 16, height 5\' 5"  (1.651 m), weight 68 kg, SpO2 100 %.  Medical Problem List and Plan: 1. Functional deficits secondary to debility related to VF cardiac arrest secondary to massive pulmonary emboli status post mechanical thrombectomy             -patient may not shower due to HD catheter- unless it's removed             -ELOS/Goals: 10-14 days supervision to min A for PT/OT SLP  -Continue CIR therapies including PT, OT, and SLP  2.  Antithrombotics: -DVT/anticoagulation:  Pharmaceutical: Other (comment) Eliquis             -antiplatelet therapy: N/A 3. Pain Management: Neurontin 100 mg twice daily, oxycodone as needed  2/9- had been on gabapentin for nerve pain-will stop due to sedation- will start RLE lidoderm patch 2 patches for pain.   2/13  more alert off gabapentin in spite of being on at home prior for neuropathy 4. Mood anxiety-  Cymbalta 20 mg daily, Xanax 0.25 mg daily as needed provide emotional support             -antipsychotic agents: N/A 5. Neuropsych: This patient is? capable of making decisions on her own behalf. 6. Skin/Wound Care: Routine skin checks 7. Fluids/Electrolytes/Nutrition: still not eating much  -continue TF for now.   -hopefully po intake will pick up as mentation improves.  8.  Retroperitoneal bleed/hemorrhagic shock/acute blood loss anemia/status post left liver lobe embolization  2/9- will check LFTs tomorrow to make sure liver function doing OK?  2/11- LFTs look great- con't to monitor every so often 9.  AKI secondary to  ATN.  Follow-up per renal services.  Started clip for AKI- not ESRD, however Cr almost 5 today- getting HD tomorrow 2/8.   2/9- last Cr 5.74- up from 4.97 up from 4.45- rising- per renal- likely HD tomorrow?.   2/13- Cr 3.9 today- per  renal  10.  Thrombocytopenia.  Suspect ITP.  Follow-up per hematology  2/13- plts 83k. Continue to monitor (has ranged between 60k and 110k) 11.  Hyperlipidemia.  Lipitor 12. Diabetes- Poor appetite- has Cortrak for intake due to poor appetite-stop insulin- was on at home- levemir 4 units BID when came to rehab, but dropping CBGs- so will hold- will do CBGs BID Will assess if still needs- d/c insulin due to Tfs  2/11- TF's restarted so CBGs going up- 486 this AM- restarted Levemir 4 units BID and SSI- will titrate as required.   2/12- got called about Bgs in 400s- again this AM- due to TF's- will increase Levemir to 8 units BID and monitor-   2/13 levemir just increased--observe today 13. New onset confusion/poor memory  2/9- will check CT of head- and stop Gabapentin for nerve pain due to increased sedation- had before but so out of it per daughter- much worse last 2 days. If negative, will order MRI, since much worse cognitively.   2/13 seems to be improving 14. ABLA with chronic anemia  2/13 hb stable at 8.2 15. Scab on back of head-  2/9- on the back of her head  2/10- will need to have hair washed to see if we can see wound that's on CT  2/13 [place] RN examined wound today --peroxide and mupirocin bid with dry dressing 16. HTN with hx of VF arrest  2/10- pt's BP 178/95- running 791T/056P- systolic- will d/w renal since not on any BP meds-   2/11- Started norvasc and BP looks better 794 systolic- will con't to monitor trend.   2/13 bp borderline but improved from baseline--continue curent meds    LOS: 6 days A FACE TO [place] 12/10/2021, 1:22 PM

## 2021-12-10 NOTE — Consult Note (Signed)
WOC Nurse Consult Note: In to assess wound to occiput after prolonged illness due to massive PE, including cardiac arrest.  Is on hemodialysis.  Family and patient agreed to have patient's hair cut in this area so the wound could be assessed.  Her hair is currently braided in large segments in this area and the least amount of hair needed to be trimmed was removed.   Reason for Consult:Scabbed wound to occiput.   Wound type:pressure Pressure Injury POA: Yes Measurement: 3 cm round scabbed lesion.  Has some dried hair and effluent in the wound bed as well.   Wound bed: only scab and dried matter in woundbed.  Will need to soften this.  Drainage (amount, consistency, odor) none but scab is fluctuant.  Periwound: patient's hair.  Dressing procedure/placement/frequency: Cleanse wound to back of head daily with peroxide to soften scab and dried hair/matter in wound bed.  Pat gently dry. Apply mupirocin ointment twice daily.  Cover with dry bandage,  Will not follow at this time.  Please re-consult if needed.  Domenic Moras MSN, RN, FNP-BC CWON Wound, Ostomy, Continence Nurse Pager [phone]

## 2021-12-10 NOTE — Progress Notes (Signed)
Patients cortrak unhooked over night and also had a few clogging issues. CBG this morning was 267, levemir increased yesterday to 8 units. Patient in bed with call bell within reach. Will continue with plan of care.

## 2021-12-10 NOTE — Progress Notes (Signed)
Pt has been accepted at [facility]) on TTS schedule. Pt's chair time is 1:00pm. Message left for pt's dtr this am requesting a return call to discuss arrangements. Pt's start date will be determined based on pt's d/c date from rehab. Will provide update to CSW. Will assist as needed.   Melven Sartorius Renal Navigator [phone]

## 2021-12-10 NOTE — Progress Notes (Signed)
Speech Language Pathology Daily Session Note  Patient Details  Name: [name] MRN: [id number] Date of Birth: [birth year]  Today's Date: 12/10/2021 SLP Individual Time: 1032-1105 SLP Individual Time Calculation (min): 33 min  Short Term Goals: Week 1: SLP Short Term Goal 1 (Week 1): Patient will demonstrate sustained attention to functional tasks for 5 minutes with Mod verbal cues for redirection. SLP Short Term Goal 2 (Week 1): Patient will demonstrate functional problem solving for basic and familair tasks with Mod verbal and visual cues. SLP Short Term Goal 3 (Week 1): Patient will demonstrate orientation to place, time and situation with Mod verbal and visual cues. SLP Short Term Goal 4 (Week 1): Patient will identify 2 cognitive and 2 physical deficits with Mod multimodal cues. SLP Short Term Goal 5 (Week 1): Patient will consume current diet with minimal overt s/s of aspiration with supervision level verbal cues for use of swallowing compensatory strategies. SLP Short Term Goal 6 (Week 1): Patient will utilize word-finding strategies at the phrase level with Mod verbal cues in 50% of opportunities.  Skilled Therapeutic Interventions:Skilled ST services focused on cognitive skills. Pt expressed fatigue but was agreeable to participate in ST services. SLP facilitated word finding, basic problem solving, error awareness and sustained attention in 4 step ADL picture card sequencing task. Pt required max A fade to mod A verbal cues to sequence picture cards, increased accuracy when cued to verbalize steps prior to sequencing. Pt required mod A fade to min A verbal cues for word finding at simple phrase level when describing picture scenes. Pt was abel to self-correct verbal errors x2. Pt missed 10 minutes due to fatigue and SLP allowing break time prior to last therapy before HD.Pt was left in room with call bell within reach and chair alarm set. SLP recommends to continue skilled services.       Pain Pain Assessment Pain Score: 0-No pain  Therapy/Group: Individual Therapy  Derreon Consalvo  [facility] 12/10/2021, 12:40 PM

## 2021-12-10 NOTE — Progress Notes (Signed)
Physical Therapy Session Note  Patient Details  Name: [name] MRN: [id number] Date of Birth: 08-02-1951  Today's Date: 12/10/2021 PT Individual Time: 0900-0950 PT Individual Time Calculation (min): 50 min   Short Term Goals: Week 1:  PT Short Term Goal 1 (Week 1): Pt will initiate standing with RW PT Short Term Goal 2 (Week 1): Pt will tolerate sitting OOB between sessions PT Short Term Goal 3 (Week 1): Pt will tolerate x 5 min therapeutic exercise  Skilled Therapeutic Interventions/Progress Updates:    Pt received seated in w/c in room, agreeable to PT session. No complaints of pain this AM, does report being frustrated with therapy schedule and not having time to eat breakfast before it gets cold. Nursing aware and to call in new breakfast tray for patient. Assisted pt with donning knee high TEDs and tennis shoes. Sit to stand with max A to total A x 2 to stedy with cues for "nose over toes" for anterior weight shift. Pt able to tolerate standing x 30 sec in stedy before reporting dizziness, returned to sitting in perched position in stedy seat. Stedy transfer from 20x18 w/c to 18x18 w/c for improved fit. Pt is CGA to stand from perched position. Pt declines any further standing following stedy transfer due to fatigue. Manual w/c propulsion x 100 ft with use of BUE at min A level with max cueing for correct propulsion technique, significantly increased time needed to complete, and path deviation to the L noted. Pt demonstrates decreased awareness of obstacles in L visual field. Pt requests to return to room to eat her breakfast. Pt left seated in w/c in room with needs in reach, quick release belt and chair alarm in place. Pt missed 10 min of scheduled therapy session to allow time to eat breakfast.  Therapy Documentation Precautions:  Precautions Precautions: Fall, Other (comment) Precaution Comments: cortrak; right leg discomfort - PTA Restrictions Weight Bearing Restrictions:  No General: PT Amount of Missed Time (min): 10 Minutes PT Missed Treatment Reason: Unavailable (Comment) (eating breakfast)      Therapy/Group: Individual Therapy   Excell Seltzer, PT, DPT, CSRS  12/10/2021, 9:55 AM

## 2021-12-11 LAB — GLUCOSE, CAPILLARY
Glucose-Capillary: 435 mg/dL — ABNORMAL HIGH (ref 70–99)
Glucose-Capillary: 329 mg/dL — ABNORMAL HIGH (ref 70–99)
Glucose-Capillary: 239 mg/dL — ABNORMAL HIGH (ref 70–99)
Glucose-Capillary: 75 mg/dL (ref 70–99)
Glucose-Capillary: 177 mg/dL — ABNORMAL HIGH (ref 70–99)

## 2021-12-11 MED ORDER — DICYCLOMINE HCL 20 MG PO TABS
10.0000 mg | ORAL_TABLET | Freq: Three times a day (TID) | ORAL | Status: DC
  Administered 2021-12-11 – 2022-01-07 (×81): 10 mg via ORAL
  Filled 2021-12-11 (×86): qty 1

## 2021-12-11 MED ORDER — CHLORHEXIDINE GLUCONATE CLOTH 2 % EX PADS
6.0000 | MEDICATED_PAD | Freq: Every day | CUTANEOUS | Status: DC
  Administered 2021-12-11 – 2021-12-13 (×3): 6 via TOPICAL

## 2021-12-11 MED ORDER — AMLODIPINE BESYLATE 10 MG PO TABS
10.0000 mg | ORAL_TABLET | Freq: Every day | ORAL | Status: DC
  Administered 2021-12-11 – 2022-01-06 (×27): 10 mg via ORAL
  Filled 2021-12-11 (×28): qty 1

## 2021-12-11 MED ORDER — SORBITOL 70 % SOLN
30.0000 mL | Freq: Once | Status: AC
  Administered 2021-12-11: 30 mL via ORAL
  Filled 2021-12-11: qty 30

## 2021-12-11 MED ORDER — AMLODIPINE BESYLATE 5 MG PO TABS
5.0000 mg | ORAL_TABLET | Freq: Once | ORAL | Status: AC
  Administered 2021-12-11: 5 mg via ORAL
  Filled 2021-12-11: qty 1

## 2021-12-11 NOTE — Patient Care Conference (Addendum)
Inpatient RehabilitationTeam Conference and Plan of Care Update Date: 12/11/2021   Time: 11:19 AM    Patient Name: [name]      Medical Record Number: [id number]  Date of Birth: Dec 08, 1950 Sex: Female         Room/Bed: 4W04C/4W04C-01 Payor Info: Payor: HUMANA MEDICARE / Plan: HU[employer]NA MEDICARE CHOICE PPO / Product Type: *No Product type* /    Admit Date/Time:  12/04/2021  6:47 PM  Primary Diagnosis:  [facility] Problems: Principal Problem:   Debility Active Problems:   Cardiac arrest ([place])   Acute saddle pulmonary embolism with acute cor pulmonale ([place])   Acute on chronic renal failure ([place])   Anoxia of brain [facility])    Expected Discharge Date: Expected Discharge Date:  (SNF)  Team Members Present: Physician leading conference: Dr. Courtney Heys Social Worker Present: [name], [place] Nurse Present: Dorthula Nettles, RN PT Present: Excell Seltzer, PT OT Present: Roanna Epley, [place], OT SLP Present: Charolett Bumpers, SLP PPS Coordinator present : Gunnar Fusi, SLP     Current Status/Progress Goal Weekly Team Focus  Bowel/Bladder   foley for bladder. LBM 2/10  regain continence  foley care, time toileting   Swallow/Nutrition/ Hydration   dys 2 textures and thin liquids, NG tube supplemental nurtition  supervision  A - downgraded 2/14  increase PO intake and advacning solid textures   ADL's   bathing-mod A; UB dressing-min A; L dressing-max A; toileting-mod A; transffers with Stedy  min A overall  activity tolerance, functional transfers, BADLs   Mobility   mod A bed mobility, max to total A x 2 transfers with stedy, min A w/c mobility x 100 ft  min A overall, short distance gait goal  endurance, w/c mobility, transfers, standing as able   Communication   mod-min  A, word finding at phrase level  supervision A - downgrade 2/14  word finding naming at phrase level, clear expression/organization of thoughts   Safety/Cognition/ Behavioral  Observations  max-mod A  Min A - downgraded 2/14  sustained attention, basic problem solving, error awareness and recall   Pain   Has prn pain med scheduled  <4  assess pain q 4hr and prn   Skin   Area under L Breast, IJ site right chest, IV site left hand, wound back of head  no new breakdown  assess skin q shift and prn     Discharge Planning:  Pt will d/c to home and her daughter [name] will remain in the home since she works from home. Pt needs to be intermittent level of care at discharge.   Team Discussion: Receiving tube feeds at night. Doesn't move head, doesn't eat. Sorbitol ordered for constipation. Started Megace as appetite stimulant. Elevated CBG's. Foley, incontinent bowel. Reports 8/10 pain to right foot. All skin wounds addressed and treating appropriately. Will need to schedule family conference. Daughter stays with patient, very involved.   Patient on target to meet rehab goals: Min assist goals. Currently +2 steady transfer, mod assist bed mobility, mod/max assist ADL's. Can't direct care well. Dys 2, only a couple of bites, poor initiation. Anxiety, cognition, and attention deficits.  *See Care Plan and progress notes for long and short-term goals.   Revisions to Treatment Plan:  Adjusting medications   Teaching Needs: Family education, medication/pain management, skin/wound care, bowel management, foley care, safety awareness, transfer training, etc.   Current Barriers to Discharge: Decreased caregiver support, Incontinence, Wound care, Nutritional means, and anxiety, cognition, attention.  Possible Resolutions to Barriers: Family education Schedule family meeting Offer nutritional supplements Family bring in foods patient likes Time toileting schedule SW pursue SNF options     Medical Summary Current Status: a lot of skin issues- occiput-unstageable and blister on sternum- Cortrak- can eat but won't-  Barriers to Discharge: Behavior;Decreased  family/caregiver support;Home enviroment access/layout;Medical stability;Nutrition means;Incontinence;Other (comments);Wound care  Barriers to Discharge Comments: daughter works from home, but needs to be low level of care- cannot get there- Possible Resolutions to [employer] Focus: need ot try get of foley- per renal- ESRD; lots of cognitive issues- needs family conference- might need PEG- won't eat- is DNR- cannot direct her own care/mod-max for ADLs- SLP- down to D2 diet- cognition- poor attention/anxiety-- SNF family conference TH   Continued Need for Acute Rehabilitation Level of Care: The patient requires daily medical management by a physician with specialized training in physical medicine and rehabilitation for the following reasons: Direction of a multidisciplinary physical rehabilitation program to maximize functional independence : Yes Medical management of patient stability for increased activity during participation in an intensive rehabilitation regime.: Yes Analysis of laboratory values and/or radiology reports with any subsequent need for medication adjustment and/or medical intervention. : Yes   I attest that I was present, lead the team conference, and concur with the assessment and plan of the team.   Cristi Loron 12/11/2021, 4:44 PM

## 2021-12-11 NOTE — Progress Notes (Signed)
Spoke to pt's dtr via phone to discuss out-pt HD arrangements. Dtr agreeable to arrangements. Will coordinate start date with family/clinic once pt's d/c date is known. Will follow and assist.   Melven Sartorius Renal Navigator [phone]

## 2021-12-11 NOTE — Progress Notes (Addendum)
Patient ID: [name], female   DOB: 24-Jan-1951, 71 y.o.   MRN: [id number]  SW met with pt dtr [name], granddaugter, and dtr [name] (conference call) to provide updates from team conference, and recommendation for SNF based on patient care needs in which pt requires 24/7 care and continued assistance at this time. Discussed family meeting. Scheduled for 11am on Thursday (2/16) with pt family. Dtr [name] will be in person, and pt dtr [name] and granddaughter via conference call.   SNF referral sent out. Pending NCPASSR#.  [name], MSW, [place] Office: [phone] Cell: [phone] Fax: [phone]

## 2021-12-11 NOTE — Progress Notes (Signed)
Occupational Therapy Weekly Progress Note  Patient Details  Name: [name] MRN: [id number] Date of Birth: 1951/05/20  Beginning of progress report period: December 05, 2021 End of progress report period: December 11, 2021  Today's Date: 12/11/2021 OT Individual Time: 1300-1357 OT Individual Time Calculation (min): 57 min    Patient has met 0 of 3 short term goals.  Pt has made limited progress toward OT goals at this time, pt limited by decreased initiation, sequencing, and motor planning. Pt is requiring stedy for ADL transfers, Max A for sit > stand at sink for bathing, Max-total A for UB/LB dressing as well. Planning for DC to SNF for further care, continue OT POC to decrease caregiver burden and maximize potential.  Patient continues to demonstrate the following deficits: muscle weakness, decreased cardiorespiratoy endurance, impaired timing and sequencing, unbalanced muscle activation, decreased coordination, and decreased motor planning, decreased motor planning, decreased initiation, decreased attention, decreased awareness, decreased problem solving, decreased safety awareness, decreased memory, and delayed processing, and decreased sitting balance, decreased standing balance, decreased postural control, and decreased balance strategies and therefore will continue to benefit from skilled OT intervention to enhance overall performance with BADL and Reduce care partner burden.  Patient progressing toward long term goals..  Continue plan of care.  OT Short Term Goals Week 1:  OT Short Term Goal 1 (Week 1): PT will performed sit to stand with mod A of +1 in prep for clothing managment for toileting/ dressing OT Short Term Goal 1 - Progress (Week 1): Progressing toward goal OT Short Term Goal 2 (Week 1): Pt will don shirt with min A in unsupported seated position OT Short Term Goal 2 - Progress (Week 1): Progressing toward goal OT Short Term Goal 3 (Week 1): Pt will be oriented x4  with min cues consistently OT Short Term Goal 3 - Progress (Week 1): Progressing toward goal Week 2:  OT Short Term Goal 1 (Week 2): STG = LTG 2/2 LOS  Skilled Therapeutic Interventions/Progress Updates:    Pt greeted at time of session sitting up in wheelchair with NT relaying that pt wanted family to take a bath during OT session, since has port performed sink level bathing this session focused on sitting balance, standing balance at sink, and ADL retraining. Note initially engaged pt in self feeding as lunch looked untouched, multiple cues to encourage PO intake with pt only placing fork near her hair and not eating. Set up at stink and doffed clothes with Max/total prior to bathing. Pt performed UB/LB bathing Max/total A overall as pt has limited initiation, sequencing, and command following and often perseverating on holding wash cloth instead of actually scrubbing and bathing. Eventually needing hand over hand at times but resistant to this. Sit > stand at sink with extended time to initiate and 2 helpers, one on each side for comfort with pt able to stand with Max A of 1. Also able to static stand with Mod/Max to wash periarea. Donned shirt with Max A, LB dress as well for brief and pants with Max/total A. Pt set up in wheelchair with alarm on call bell  in reach.   Therapy Documentation Precautions:  Precautions Precautions: Fall, Other (comment) Precaution Comments: cortrak; right leg discomfort - PTA Restrictions Weight Bearing Restrictions: No    Therapy/Group: Individual Therapy  Viona Gilmore 12/11/2021, 12:49 PM

## 2021-12-11 NOTE — Progress Notes (Signed)
Attempted to give patient [employer] supplement. Unable to clear tube with warm water, coke, or wire. Tube has blockage that is unable to be cleared.

## 2021-12-11 NOTE — Progress Notes (Signed)
Occupational Therapy Session Note  Patient Details  Name: [name] MRN: [id number] Date of Birth: 1951-10-15  Today's Date: 12/11/2021 OT Individual Time: 1130-1200 OT Individual Time Calculation (min): 30 min    Short Term Goals: Week 1:  OT Short Term Goal 1 (Week 1): PT will performed sit to stand with mod A of +1 in prep for clothing managment for toileting/ dressing OT Short Term Goal 2 (Week 1): Pt will don shirt with min A in unsupported seated position OT Short Term Goal 3 (Week 1): Pt will be oriented x4 with min cues consistently  Skilled Therapeutic Interventions/Progress Updates:     Pt resting in w/c upon arrival with family present. OT intervention with focus on sit<>stand and standing in [place], following simple commands, and activity tolerance. Sit<>stand in [place] with min A. Standing balance in [place] with min A when reaching for bean bag and tossing at objects. Pt also kicked therapy ball 3x5. Pt returned to room and remained in w/c with belt alarm activated. All needs within reach and family present  Therapy Documentation Precautions:  Precautions Precautions: Fall, Other (comment) Precaution Comments: cortrak; right leg discomfort - PTA Restrictions Weight Bearing Restrictions: No  Pain: Pt reports Rt ankle pain; repositioned    Therapy/Group: Individual Therapy  Leroy Libman 12/11/2021, 1:39 PM

## 2021-12-11 NOTE — Plan of Care (Signed)
°  Problem: RH Swallowing Goal: LTG Patient will consume least restrictive diet using compensatory strategies with assistance (SLP) Description: LTG:  Patient will consume least restrictive diet using compensatory strategies with assistance (SLP) Flowsheets (Taken 12/11/2021 1132) LTG: Pt Patient will consume least restrictive diet using compensatory strategies with assistance of (SLP): (downgraded due to cognition) Supervision Note: Downgraded due to cognition   Problem: RH Expression Communication Goal: LTG Patient will increase word finding of common (SLP) Description: LTG:  Patient will increase word finding of common objects/daily info/abstract thoughts with cues using compensatory strategies (SLP). Flowsheets (Taken 12/11/2021 1132) LTG: Patient will increase word finding of common (SLP): (downgraded due to cognition) Supervision Note: Downgraded due to cognition   Problem: RH Problem Solving Goal: LTG Patient will demonstrate problem solving for (SLP) Description: LTG:  Patient will demonstrate problem solving for basic/complex daily situations with cues  (SLP) Flowsheets (Taken 12/11/2021 1132) LTG: Patient will demonstrate problem solving for (SLP): (donwgraded due to cognition) Basic daily situations Note: Downgraded due to cognition   Problem: RH Attention Goal: LTG Patient will demonstrate this level of attention during functional activites (SLP) Description: LTG:  Patient will will demonstrate this level of attention during functional activites (SLP) Flowsheets (Taken 12/11/2021 1132) Patient will demonstrate during cognitive/linguistic activities the attention type of: Selective LTG: Patient will demonstrate this level of attention during cognitive/linguistic activities with assistance of (SLP): (downgraded due to cognition) Minimal Assistance - Patient > 75% Note: Downgraded due to cognition

## 2021-12-11 NOTE — NC FL2 (Signed)
MEDICAID FL2 LEVEL OF CARE SCREENING TOOL     IDENTIFICATION  Patient Name: [name] Birthdate: Apr 20, 1951 Sex: female Admission Date (Current Location): 12/04/2021  [facility] and [place] Number:  Herbalist and Address:         Provider Number: [id number]  Attending Physician Name and Address:  Lovorn, Jinny Blossom, MD  Relative Name and Phone Number:  [name] Vaden [phone]    Current Level of Care: Hospital Recommended Level of Care: Nursing Facility Prior Approval Number:    Date Approved/Denied:   PASRR Number:    Discharge Plan: SNF    Current Diagnoses: Patient Active Problem List   Diagnosis Date Noted   Debility 12/04/2021   Anoxia of brain ([place]) 12/04/2021   Acute on chronic renal failure ([place]) 11/12/2021   Leukocytosis    Thrombocytopenia (H[facility])    Aspiration pneumonia of both lower lobes (H[facility])    Hemodialysis-associated hypotension    Acute respiratory failure ([place]) due to recurrent aspiration Pneumonia and mucous plugging/atelectasis     Acute saddle pulmonary embolism with acute cor pulmonale ([place])    Protein-calorie malnutrition, severe 11/08/2021   Cardiac arrest ([place]) 10/28/2021   Adjustment disorder with depressed mood 04/10/2021   History of infectious disease 04/10/2021   Affective psychosis ([place]) 11/21/2020   Allergic rhinitis due to pollen 11/21/2020   Anxiety 11/21/2020   Chronic kidney disease, stage 2 (mild) 11/21/2020   Diabetic renal disease ([place]) 11/21/2020   Diverticular disease of colon 11/21/2020   Eczema 11/21/2020   Gastroesophageal reflux disease 11/21/2020   Gout 11/21/2020   Joint pain 11/21/2020   Lymphedema 11/21/2020   Mild intermittent asthma 11/21/2020   Moderate major depression, single episode ([place]) 11/21/2020   Neuropathy 11/21/2020   Type 2 diabetes mellitus with other specified complication ([place]) 09/47/0962   Pure hypercholesterolemia 11/21/2020   Vitamin D deficiency 11/21/2020    Fibromyalgia 09/04/2017   Insomnia 09/04/2017   DDD (degenerative disc disease), cervical 09/04/2017   DDD (degenerative disc disease), lumbar 09/04/2017   Primary osteoarthritis of both hands 09/04/2017   Primary osteoarthritis of both knees 09/04/2017   History of rotator cuff tear 09/04/2017   Osteopenia of multiple sites 09/04/2017   History of diabetes mellitus 09/04/2017   History of hypertension 09/04/2017   History of hypercholesterolemia 09/04/2017    Orientation RESPIRATION BLADDER Height & Weight     Self, Situation, Place, Time  Normal Incontinent Weight: 152 lb 8.9 oz (69.2 kg) Height:  5\' 5"  (165.1 cm)  BEHAVIORAL SYMPTOMS/MOOD NEUROLOGICAL BOWEL NUTRITION STATUS      Incontinent Diet, Feeding tube (D2 thin; likely to get a PEG as has Cortrak. Nocturnal feeds due to poor PO intake. REquires supervision to ensure she is eating.)  AMBULATORY STATUS COMMUNICATION OF NEEDS Skin   Limited Assist Verbally Other (Comment) (Sternum has foam dressing due to ruptured blister; head as a wound requires daily hair washing to soften location as hair is matted around it.)                       Personal Care Assistance Level of Assistance  Bathing, Feeding, Dressing Bathing Assistance: Limited assistance Feeding assistance: Limited assistance Dressing Assistance: Limited assistance     Functional Limitations Info  Sight, Speech, Hearing Sight Info: Adequate Hearing Info: Adequate Speech Info: Adequate    SPECIAL CARE FACTORS FREQUENCY  PT (By licensed PT), OT (By licensed OT), Speech therapy     PT Frequency: 5xs per  week OT Frequency: 5xs per week     Speech Therapy Frequency: 5xs per week      Contractures Contractures Info: Not present    Additional Factors Info  Code Status, Allergies Code Status Info: DNR Allergies Info: Aspirin- Nausea And Vomiting;  Benadryl (diphenhydramine)-Can only take dye free. States the dye causes itching; Darvon (propoxyphene)-  Itching Can only during the day. Night time makes her itch; Hydrocodone Bit-homatrop Mbr-Itching;   Metformin-GI; Other- States reaction was to nasal spray that caused pupils to shrink;  Pentazocine- nervous/jitters;  Percocet (oxycodone-acetaminophen)- itching;  Sulfa Antibiotics-  Hives;   Tetracaine Hcl-Thinks caused itching;  Tetracyclines & Related-  Hives;   Tramadol- itch           Current Medications (12/11/2021):  This is the current hospital active medication list Current Facility-Administered Medications  Medication Dose Route Frequency Provider Last Rate Last Admin   acetaminophen (TYLENOL) tablet 650 mg  650 mg Oral Q4H PRN Cathlyn Parsons, PA-C   650 mg at 12/11/21 0998   Or   acetaminophen (TYLENOL) 160 MG/5ML solution 650 mg  650 mg Per Tube Q4H PRN Angiulli, Lavon Paganini, PA-C       Or   acetaminophen (TYLENOL) suppository 650 mg  650 mg Rectal Q4H PRN Angiulli, Lavon Paganini, PA-C       ALPRAZolam Duanne Moron) tablet 0.25 mg  0.25 mg Oral Daily PRN Cathlyn Parsons, PA-C   0.25 mg at 12/11/21 1132   amLODipine (NORVASC) tablet 10 mg  10 mg Oral Q2000 Pham, Minh Q, RPH-CPP       apixaban (ELIQUIS) tablet 5 mg  5 mg Oral BID AngiulliLavon Paganini, PA-C   5 mg at 12/11/21 3382   atorvastatin (LIPITOR) tablet 20 mg  20 mg Oral Daily Cathlyn Parsons, PA-C   20 mg at 12/11/21 5053   B-complex with vitamin C tablet 1 tablet  1 tablet Oral Daily Cathlyn Parsons, PA-C   1 tablet at 12/11/21 9767   camphor-menthol (SARNA) lotion   Topical PRN Cathlyn Parsons, PA-C       chlorhexidine (PERIDEX) 0.12 % solution 15 mL  15 mL Mouth Rinse BID Cathlyn Parsons, PA-C   15 mL at 12/11/21 3419   Chlorhexidine Gluconate Cloth 2 % PADS 6 each  6 each Topical Q0600 Rosita Fire, MD   6 each at 12/11/21 1013   dicyclomine (BENTYL) tablet 10 mg  10 mg Oral TID AC Pham, Minh Q, RPH-CPP   10 mg at 12/11/21 1229   docusate sodium (COLACE) capsule 100 mg  100 mg Oral BID PRN Cathlyn Parsons,  PA-C   100 mg at 12/10/21 0734   DULoxetine (CYMBALTA) DR capsule 20 mg  20 mg Oral Daily Cathlyn Parsons, PA-C   20 mg at 12/11/21 0915   feeding supplement (NEPRO CARB STEADY) liquid 237 mL  237 mL Oral TID BM AngiulliLavon Paganini, PA-C   237 mL at 12/10/21 2144   feeding supplement (OSMOLITE 1.5 CAL) liquid 975 mL  975 mL Per Tube Q24H Lovorn, Jinny Blossom, MD 75 mL/hr at 12/10/21 1901 975 mL at 12/10/21 1901   feeding supplement (PROSource TF) liquid 45 mL  45 mL Per Tube BID Lovorn, Jinny Blossom, MD   45 mL at 37/90/24 0973   folic acid (FOLVITE) tablet 2 mg  2 mg Oral Daily AngiulliLavon Paganini, PA-C   2 mg at 12/11/21 5329   Gerhardt's butt cream   Topical PRN  Cathlyn Parsons, PA-C   Given at 12/10/21 1975   heparin sodium (porcine) injection 3,200 Units  3,200 Units Intracatheter Once Claudia Desanctis, MD       insulin aspart (novoLOG) injection 0-5 Units  0-5 Units Subcutaneous QHS Lovorn, Jinny Blossom, MD   3 Units at 12/09/21 2158   insulin aspart (novoLOG) injection 0-9 Units  0-9 Units Subcutaneous TID WC Lovorn, Jinny Blossom, MD   3 Units at 12/11/21 1228   insulin detemir (LEVEMIR) injection 8 Units  8 Units Subcutaneous BID Lovorn, Jinny Blossom, MD   8 Units at 12/11/21 0915   ipratropium-albuterol (DUONEB) 0.5-2.5 (3) MG/3ML nebulizer solution 3 mL  3 mL Nebulization Q4H PRN Angiulli, Lavon Paganini, PA-C       lidocaine (LIDODERM) 5 % 2 patch  2 patch Transdermal Q24H Lovorn, Megan, MD   2 patch at 12/09/21 2028   lidocaine (XYLOCAINE) 2 % viscous mouth solution 15 mL  15 mL Oral Q6H PRN Angiulli, Lavon Paganini, PA-C       megestrol (MEGACE) 400 MG/10ML suspension 800 mg  800 mg Oral Daily Lovorn, Megan, MD   800 mg at 12/11/21 8832   mupirocin cream (BACTROBAN) 2 % 1 application  1 application Topical BID Cathlyn Parsons, PA-C   1 application at 54/98/26 1017   ondansetron (ZOFRAN) tablet 4 mg  4 mg Oral Q8H PRN Cathlyn Parsons, PA-C   4 mg at 12/11/21 4158   oxyCODONE (Oxy IR/ROXICODONE) immediate release tablet 5 mg   5 mg Oral Q4H PRN Cathlyn Parsons, PA-C   5 mg at 12/11/21 1124   pantoprazole (PROTONIX) EC tablet 40 mg  40 mg Oral BID Cathlyn Parsons, PA-C   40 mg at 12/11/21 3094   senna (SENOKOT) tablet 17.2 mg  2 tablet Oral Daily Lovorn, Megan, MD   17.2 mg at 12/11/21 0906   sorbitol 70 % solution 30 mL  30 mL Oral Once Lovorn, Megan, MD       temazepam (RESTORIL) capsule 15 mg  15 mg Oral QHS PRN Angiulli, Lavon Paganini, PA-C       white petrolatum (VASELINE) gel   Topical PRN Cathlyn Parsons, PA-C         Discharge Medications: Please see discharge summary for a list of discharge medications.  Relevant Imaging Results:  Relevant Lab Results:   Additional Information [id number]  Rana Snare, LCSW

## 2021-12-11 NOTE — Progress Notes (Incomplete)
Speech Language Pathology Weekly Progress and Session Note  Patient Details  Name: [name] MRN: [id number] Date of Birth: [birth year]  Beginning of progress report period: December 06, 2021 End of progress report period: December 12, 2021  {chl ip rehab slp time calculations:304100500}  Short Term Goals: {YVD:7322567}    New Short Term Goals: {CSP:1980221}  Weekly Progress Updates:     Intensity:   Frequency:   Duration/Length of Stay:   Treatment/Interventions:     Daily Session  Skilled Therapeutic Interventions: ***     General    Pain Pain Assessment Pain Scale: Faces Pain Score: 0-No pain Faces Pain Scale: Hurts even more Pain Type: Surgical pain Pain Location: Groin Pain Descriptors / Indicators: Grimacing;Guarding Pain Frequency: Intermittent Pain Onset: On-going Pain Intervention(s): Medication (See eMAR)  Therapy/Group: {Therapy/Group:[id number]}  [name] 12/11/2021, 12:42 PM

## 2021-12-11 NOTE — Progress Notes (Signed)
Speech Language Pathology Daily Session Note  Patient Details  Name: [name] MRN: [id number] Date of Birth: 07-15-1951  Today's Date: 12/11/2021 SLP Individual Time: 0802-0900 SLP Individual Time Calculation (min): 58 min  Short Term Goals: Week 1: SLP Short Term Goal 1 (Week 1): Patient will demonstrate sustained attention to functional tasks for 5 minutes with Mod verbal cues for redirection. SLP Short Term Goal 2 (Week 1): Patient will demonstrate functional problem solving for basic and familair tasks with Mod verbal and visual cues. SLP Short Term Goal 3 (Week 1): Patient will demonstrate orientation to place, time and situation with Mod verbal and visual cues. SLP Short Term Goal 4 (Week 1): Patient will identify 2 cognitive and 2 physical deficits with Mod multimodal cues. SLP Short Term Goal 5 (Week 1): Patient will consume current diet with minimal overt s/s of aspiration with supervision level verbal cues for use of swallowing compensatory strategies. SLP Short Term Goal 6 (Week 1): Patient will utilize word-finding strategies at the phrase level with Mod verbal cues in 50% of opportunities.  Skilled Therapeutic Interventions:Skilled ST services focused on swallow and cognitive skills. PT student was present observing ST during today's treatment session. Pt was sitting in bed with breakfast set up but not eating. SLP offered assistance with breakfast, pt consumed x1 bite with max verbal cues. Pt demonstrated poor attention and bolus awareness, requiring extended time for bolus prep, further impacted by missing upper denture plate. Pt supports upper dentures were broken during a fall. SLP offered various food items to increase PO intake, preferred foods and to assess PO consumption of dsy 1 and dys 2 textures. Pt was agreeable to consume only dys 1 texture (applesauce) and dys 3 (cereal and fruit cocktail) textures. Pt only consumed limited amounts ( 5 bites total), but demonstrated  improved oral manipulation/bolus prep time with dys 1 textures verse dys 3 textures. SLP downgraded pt to dys 2 textures and thin liquids with full supervision A, to increase PO intake and assist/encourage PO consumption. Pt agreed. SLP suspect pt would prefer the appearance and textures of dys 2 textures verse dys 1 textures, do to pt's expressed preferential PO consumption. Pt expressed feelings of nausea and abdominal pian. Pt required max A verbal cues to express thoughts clearly at word/phrase level and in the response to yes/no questions. Pt requested to use the bathroom, via response to yes/no questions. SLP and PT student assisted in transfer from the bed to the steady and then to the toilet. Pt required an inappropriate amount of time and max A verbal cues to follow direction during the transfer, further impacted by expressed anxiety" im nervous" and feelings of nausea.  Pt demonstrated difficulty with initiating task, count down/timed cues appeared to increase initiation slightly. Pt was handed off to entering PT. SLP recommends to continue skilled services.     Pain Pain Assessment Pain Scale: Faces Pain Score: 0-No pain Faces Pain Scale: Hurts even more Pain Type: Surgical pain Pain Location: Groin Pain Descriptors / Indicators: Grimacing;Guarding Pain Frequency: Intermittent Pain Onset: On-going Pain Intervention(s): Medication (See eMAR)  Therapy/Group: Individual Therapy  Brieanne Mignone  [facility] 12/11/2021, 12:25 PM

## 2021-12-11 NOTE — Progress Notes (Addendum)
Inpatient Diabetes Program Recommendations  AACE/ADA: New Consensus Statement on Inpatient Glycemic Control (2015)  Target Ranges:  Prepandial:   less than 140 mg/dL      Peak postprandial:   less than 180 mg/dL (1-2 hours)      Critically ill patients:  140 - 180 mg/dL   Lab Results  Component Value Date   GLUCAP 239 (H) 12/11/2021   HGBA1C 9.0 (H) 10/28/2021    Review of Glycemic Control  Latest Reference Range & Units 12/10/21 05:58 12/10/21 11:14 12/10/21 18:27 12/10/21 21:46 12/11/21 07:03 12/11/21 09:37 12/11/21 12:03  Glucose-Capillary 70 - 99 mg/dL 267 (H) 209 (H) Novolog 3 units @ 1232 86 197 (H) 435 (H) 329 (H) Novolog 7 units@ 1016 239 (H) Novolog 3 units@ 1228  (H): Data is abnormally high  Diabetes history: DM2 Outpatient Diabetes medications: Levemir 4 units bid, Novolog 0-9 units q 4 hrs. Current orders for Inpatient glycemic control: Levemir 8 units bid, Novolog 0-9 units tid, 0-5 units hs  Inpatient Diabetes Program Recommendations:   Received consult regarding insulin management for tube feeds. Noted tube feed tube blocked @ this time. When tube feeding restarted, -Add Novolog 2 units q 4 hrs. During time tube feeds are infusing. When tube feeding continuous tube feeding restarted (hold if tube feed held or stopped for any reason) -Change Novolog correction to 0-6 units q 4 hrs.  Secure chat sent to Dr. Dagoberto Ligas.  Thank you, Nani Gasser. Lelend Heinecke, RN, MSN, CDE  Diabetes Coordinator Inpatient Glycemic Control Team Team Pager [phone] (8am-5pm) 12/11/2021 12:38 PM

## 2021-12-11 NOTE — Progress Notes (Signed)
Physical Therapy Session Note  Patient Details  Name: [name] MRN: [id number] Date of Birth: Mar 01, 1951  Today's Date: 12/11/2021 PT Individual Time: 0900-0950 PT Individual Time Calculation (min): 50 min   Short Term Goals: Week 1:  PT Short Term Goal 1 (Week 1): Pt will initiate standing with RW PT Short Term Goal 2 (Week 1): Pt will tolerate sitting OOB between sessions PT Short Term Goal 3 (Week 1): Pt will tolerate x 5 min therapeutic exercise  Skilled Therapeutic Interventions/Progress Updates:    Pt received seated on toilet in bathroom handed off from SLP session. Pt attempting to void BM, unable to while seated on toilet. Sit to stand from toilet to stedy with mod A x 2. Stedy transfer back to w/c, dependent to don new brief and pull up pants over hips. Pt states she feels that is having a heart attack with pain in her chest, her back, and her legs. Nursing in room in room to assess pt as well and provide AM medications. Seated BP 168/102, HR 110. Pain determined to be from pt anxiety and symptoms improve once seated in w/c. Pt able to receive AM medications from nursing with increased time and encouragement required. Seated BP re-assessed to be 132/117, HR 100. BG 329 but lower from earlier this AM per nursing. Pt unable to further functionally participate in session due to nausea and anxiety, agreeable to remain seated in w/c with needs in reach, quick release belt and chair alarm in place. Pt missed 10 min of scheduled therapy session due to nausea and anxiety.  Therapy Documentation Precautions:  Precautions Precautions: Fall, Other (comment) Precaution Comments: cortrak; right leg discomfort - PTA Restrictions Weight Bearing Restrictions: No General: PT Amount of Missed Time (min): 10 Minutes PT Missed Treatment Reason: Other (Comment) (anxiety and nausea)      Therapy/Group: Individual Therapy   Excell Seltzer, PT, DPT, CSRS  12/11/2021, 12:08 PM

## 2021-12-11 NOTE — Progress Notes (Signed)
[facility] PROGRESS NOTE  Assessment/ Plan:  #Acute kidney injury secondary due to ischemic ATN in the setting of cardiac arrest, shock and contrast.  She has underlying CKD with baseline creatinine level around 1.3 back in 2021.  She has been RRT dependent since 10/31/2021 which was discussed with the patient and family.  Now receiving HD per MWF schedule.  See already has a spot at [facility] T[facility] schedule second shift for AKI.   Status post HD yesterday, next dialysis tomorrow.  #V-fib cardiac arrest outside the hospital due to massive PE.  #Massive PE status post mechanical thrombectomy on 1/2. Vasc US- no DVT, liver mass noted 10/28/21 CT abd/ pelvis, s/p MRI abd--> incompletely characterized, had MRI with gadolinium 1/20  to reevaluate -- not definitive. S/p hem/onc eval.  # Retroperitoneal bleed/Hemorrhagic shock/Acute blood loss anemia originating from left liver lobe s/p embolization 1/4--> required FFP, plts, Vit K, pRBCs; now stable. Hematology following - no ESA due to concern for her recent thromboembolic disease. Transfuse when needed   # Thrombocytopenia- suspected ITP, s/p Nplate PRN and dex. -  work up and mgmt per heme.  # Acute hypoxic RF- extubated 11/07/21, now much improved  Subjective: Seen and examined.  She is working with physical therapist.  No new event.  No nausea, vomiting, chest pain or shortness of breath.  Objective Vital signs in last 24 hours: Vitals:   12/10/21 1716 12/10/21 1817 12/10/21 2033 12/11/21 0507  BP: (!) 152/66 (!) 160/78 (!) 143/74 (!) 162/82  Pulse: 94 98 97 100  Resp: 16 16 16 16   Temp:  98 F (36.7 C) 98.1 F (36.7 C) 98.3 F (36.8 C)  TempSrc:   Oral Oral  SpO2:  98% 98% 98%  Weight:    69.2 kg  Height:       Weight change: 0 kg  Intake/Output Summary (Last 24 hours) at 12/11/2021 0920 Last data filed at 12/10/2021 1714 Gross per 24 hour  Intake --  Output 0 ml  Net 0 ml        Labs: Basic Metabolic  Panel: Recent Labs  Lab 12/08/21 0527 12/09/21 0610 12/10/21 0516  NA 136 138 139  K 3.9 4.0 3.9  CL 98 102 104  CO2 26 24 24   GLUCOSE 466* 425* 260*  BUN 18 30* 38*  CREATININE 2.59* 3.56* 3.92*  CALCIUM 8.4* 8.4* 8.8*  PHOS 2.4* 3.1 3.0    Liver Function Tests: Recent Labs  Lab 12/05/21 0557 12/07/21 0525 12/08/21 0527 12/09/21 0610 12/10/21 0516  AST 24 27  --   --   --   ALT 32 28  --   --   --   ALKPHOS 136* 144*  --   --   --   BILITOT 0.8 1.0  --   --   --   PROT 6.3* 6.5  --   --   --   ALBUMIN 2.6* 2.7* 2.8* 2.6* 2.9*    No results for input(s): LIPASE, AMYLASE in the last 168 hours. No results for input(s): AMMONIA in the last 168 hours. CBC: Recent Labs  Lab 12/05/21 0557 12/07/21 0525 12/10/21 0516  WBC 9.6 9.1 12.5*  NEUTROABS 6.9 6.6  --   HGB 7.4* 8.4* 8.2*  HCT 23.1* 25.7* 25.4*  MCV 88.8 88.9 90.1  PLT 105* 110* 83*    Cardiac Enzymes: No results for input(s): CKTOTAL, CKMB, CKMBINDEX, TROPONINI in the last 168 hours. CBG: Recent Labs  Lab 12/10/21 [phone]  12/10/21 1114 12/10/21 1827 12/10/21 2146 12/11/21 0703  GLUCAP 267* 209* 86 197* 435*     Iron Studies: No results for input(s): IRON, TIBC, TRANSFERRIN, FERRITIN in the last 72 hours. Studies/Results: No results found.  Medications: Infusions:   Scheduled Medications:  amLODipine  5 mg Oral Q2000   apixaban  5 mg Oral BID   atorvastatin  20 mg Oral Daily   B-complex with vitamin C  1 tablet Oral Daily   chlorhexidine  15 mL Mouth Rinse BID   Chlorhexidine Gluconate Cloth  6 each Topical Q0600   dicyclomine  10 mg Oral TID AC   DULoxetine  20 mg Oral Daily   feeding supplement  1 Container Oral BID BM   feeding supplement (NEPRO CARB STEADY)  237 mL Oral TID BM   feeding supplement (OSMOLITE 1.5 CAL)  975 mL Per Tube Q24H   feeding supplement (PROSource TF)  45 mL Per Tube BID   folic acid  2 mg Oral Daily   heparin sodium (porcine)  3,200 Units Intracatheter Once    insulin aspart  0-5 Units Subcutaneous QHS   insulin aspart  0-9 Units Subcutaneous TID WC   insulin detemir  8 Units Subcutaneous BID   lidocaine  2 patch Transdermal Q24H   megestrol  800 mg Oral Daily   mupirocin cream  1 application Topical BID   pantoprazole  40 mg Oral BID   senna  2 tablet Oral Daily    have reviewed scheduled and prn medications.  Physical Exam: General:NAD, comfortable, nasal feeding tube in place Heart:RRR, s1s2 nl Lungs: Clear b/l, no crackle Abdomen:soft, Non-tender, non-distended Extremities:No edema Dialysis Access: Right IJ TDC  Cort Dragoo Tanna Furry 12/11/2021,9:20 AM  LOS: 7 days

## 2021-12-11 NOTE — Progress Notes (Signed)
PROGRESS NOTE   Subjective/Complaints:  Pt reports seen by Garibaldi- for wound on back of head.  Doesn't feel hungry, - TF's stopped this AM. On just at night now and supplements during day.   Feels constipated- a lot of pressure/bloating. Cannot see BM documented lately.    ROS: Pt denies SOB, abd pain, CP, N/V/C/D, and vision changes   Objective:   No results found. Recent Labs    12/10/21 0516  WBC 12.5*  HGB 8.2*  HCT 25.4*  PLT 83*   Recent Labs    12/09/21 0610 12/10/21 0516  NA 138 139  K 4.0 3.9  CL 102 104  CO2 24 24  GLUCOSE 425* 260*  BUN 30* 38*  CREATININE 3.56* 3.92*  CALCIUM 8.4* 8.8*    Intake/Output Summary (Last 24 hours) at 12/11/2021 1025 Last data filed at 12/11/2021 0900 Gross per 24 hour  Intake 100 ml  Output 0 ml  Net 100 ml        Physical Exam: Vital Signs Blood pressure (!) 162/82, pulse 100, temperature 98.3 F (36.8 C), temperature source Oral, resp. rate 16, height 5\' 5"  (1.651 m), weight 69.2 kg, SpO2 98 %.    General: awake, alert, appropriate, sitting up- untouched breakfast in front of her; NAD HENT: conjugate gaze; oropharynx moist- has Cortrak in place CV: regular rate; no JVD Pulmonary: CTA B/L; no W/R/R- good air movement GI: soft, NT, ND, (+)BS- hypoactive, protuberant, but doesn't seem distended over normal.  Psychiatric: appropriate Neurological: alert   Genitourinary:    Comments: Foley tube in place- medium clear, yellow urine in bag- no change Musculoskeletal:     Cervical back: Neck supple. No tenderness.     Comments: UE 5-/5 B/L  LE 3/5 HF/KE- DF 4-/5 on L and 2-/5 on R; PF 4-/5 B/L  R foot drop- at rest in PF   Skin:    General: Skin is warm and dry. Wound on occiput/matted with hair    Comments: R chest HD catheter  Neuro: Mild tremors with movement. Reasonable sitting balance Numb/absent/decreased sensation from mid calf to toes B/L  L>R Vague- Ox2 with cues   Assessment/Plan: 1. Functional deficits which require 3+ hours per day of interdisciplinary therapy in a comprehensive inpatient rehab setting. Physiatrist is providing close team supervision and 24 hour management of active medical problems listed below. Physiatrist and rehab team continue to assess barriers to discharge/monitor patient progress toward functional and medical goals  Care Tool:  Bathing    Body parts bathed by patient: Right arm, Left arm, Chest, Right upper leg, Left upper leg, Face   Body parts bathed by helper: Abdomen, Front perineal area, Buttocks, Right lower leg, Left lower leg     Bathing assist Assist Level: Maximal Assistance - Patient 24 - 49%     Upper Body Dressing/Undressing Upper body dressing   What is the patient wearing?: Pull over shirt    Upper body assist Assist Level: Minimal Assistance - Patient > 75%    Lower Body Dressing/Undressing Lower body dressing      What is the patient wearing?: Pants     Lower body assist Assist for lower  body dressing: Maximal Assistance - Patient 25 - 49%     Toileting Toileting Toileting Activity did not occur Landscape architect and hygiene only): N/A (no void or bm)  Toileting assist Assist for toileting: Maximal Assistance - Patient 25 - 49%     Transfers Chair/bed transfer  Transfers assist     Chair/bed transfer assist level: Dependent - mechanical lift (stedy)     Locomotion Ambulation   Ambulation assist   Ambulation activity did not occur: Safety/medical concerns          Walk 10 feet activity   Assist  Walk 10 feet activity did not occur: Safety/medical concerns        Walk 50 feet activity   Assist Walk 50 feet with 2 turns activity did not occur: Safety/medical concerns         Walk 150 feet activity   Assist Walk 150 feet activity did not occur: Safety/medical concerns         Walk 10 feet on uneven surface   activity   Assist Walk 10 feet on uneven surfaces activity did not occur: Safety/medical concerns         Wheelchair     Assist Is the patient using a wheelchair?: Yes Type of Wheelchair: Manual    Wheelchair assist level: Minimal Assistance - Patient > 75% Max wheelchair distance: 100'    Wheelchair 50 feet with 2 turns activity    Assist        Assist Level: Minimal Assistance - Patient > 75%   Wheelchair 150 feet activity     Assist      Assist Level: Dependent - Patient 0%   Blood pressure (!) 162/82, pulse 100, temperature 98.3 F (36.8 C), temperature source Oral, resp. rate 16, height 5\' 5"  (1.651 m), weight 69.2 kg, SpO2 98 %.  Medical Problem List and Plan: 1. Functional deficits secondary to debility related to VF cardiac arrest secondary to massive pulmonary emboli status post mechanical thrombectomy             -patient may not shower due to HD catheter- unless it's removed             -ELOS/Goals: 10-14 days supervision to min A for PT/OT SLP  -con't CIR- Continue CIR- PT, OT and SLP  Team confeecne today to f/u on d/c plans.  2.  Antithrombotics: -DVT/anticoagulation:  Pharmaceutical: Other (comment) Eliquis             -antiplatelet therapy: N/A 3. Pain Management: Neurontin 100 mg twice daily, oxycodone as needed  2/9- had been on gabapentin for nerve pain-will stop due to sedation- will start RLE lidoderm patch 2 patches for pain.   2/13  more alert off gabapentin in spite of being on at home prior for neuropathy 4. Mood anxiety-  Cymbalta 20 mg daily, Xanax 0.25 mg daily as needed provide emotional support             -antipsychotic agents: N/A 5. Neuropsych: This patient is? capable of making decisions on her own behalf. 6. Skin/Wound Care: Routine skin checks 7. Fluids/Electrolytes/Nutrition: still not eating much  -continue TF for now.   -hopefully po intake will pick up as mentation improves.  8.  Retroperitoneal  bleed/hemorrhagic shock/acute blood loss anemia/status post left liver lobe embolization  2/9- will check LFTs tomorrow to make sure liver function doing OK?  2/11- LFTs look great- con't to monitor every so often 9.  AKI secondary to ATN.  Follow-up  per renal services.  Started clip for AKI- not ESRD, however Cr almost 5 today- getting HD tomorrow 2/8.   2/9- last Cr 5.74- up from 4.97 up from 4.45- rising- per renal- likely HD tomorrow?.   2/13- Cr 3.9 today- per renal  10.  Thrombocytopenia.  Suspect ITP.  Follow-up per hematology  2/13- plts 83k. Continue to monitor (has ranged between 60k and 110k) 11.  Hyperlipidemia.  Lipitor 12. Diabetes- Poor appetite- has Cortrak for intake due to poor appetite-stop insulin- was on at home- levemir 4 units BID when came to rehab, but dropping CBGs- so will hold- will do CBGs BID Will assess if still needs- d/c insulin due to Tfs  2/11- TF's restarted so CBGs going up- 486 this AM- restarted Levemir 4 units BID and SSI- will titrate as required.   2/12- got called about Bgs in 400s- again this AM- due to TF's- will increase Levemir to 8 units BID and monitor-   2/13 levemir just increased--observe today  2/14- will consult DM coordiantor to help- d/c'd boost breeze- too many carbs-  13. New onset confusion/poor memory  2/9- will check CT of head- and stop Gabapentin for nerve pain due to increased sedation- had before but so out of it per daughter- much worse last 2 days. If negative, will order MRI, since much worse cognitively.   2/13 seems to be improving 14. ABLA with chronic anemia  2/13 hb stable at 8.2 15. Scab on back of head-pressure ulcer unstagebale.   2/9- on the back of her head  2/10- will need to have hair washed to see if we can see wound that's on CT  2/13 [place] RN examined wound today --peroxide and mupirocin bid with dry dressing 16. HTN with hx of VF arrest  2/10- pt's BP 178/95- running [id number]- systolic- will d/w renal since  not on any BP meds-   2/11- Started norvasc and BP looks better 242 systolic- will con't to monitor trend.   2/13 bp borderline but improved from baseline--continue curent meds  2/14- BP borderline, - con't regimen 17. Constipation  2/14- will give sorbitol- 30 cc after therapy- 2pm   I spent a total of  39  minutes on total care today- >50% coordination of care- due to DM coordinator and speaking with nursing about CBGs and team conference.        LOS: 7 days A FACE TO FACE EVALUATION WAS PERFORMED  Joseph Bias 12/11/2021, 10:25 AM

## 2021-12-11 NOTE — Progress Notes (Signed)
Ok to increase amlodipine to 10mg  PO qday per Dr. Dagoberto Ligas.  Onnie Boer, PharmD, BCIDP, AAHIVP, CPP Infectious Disease Pharmacist 12/11/2021 12:08 PM

## 2021-12-11 NOTE — Progress Notes (Signed)
Security brought pt brother up to unit, informed that can't stay here, seemed to not be alert to surroundings. Said had been sleeping on a bench out front. Michela Pitcher that will take back down to women and children. Called Odelia Gage, said that he is not to see pt. Michela Pitcher that will have brother pick him up.

## 2021-12-12 ENCOUNTER — Inpatient Hospital Stay (HOSPITAL_COMMUNITY): Payer: Medicare PPO

## 2021-12-12 LAB — RENAL FUNCTION PANEL
Albumin: 2.9 g/dL — ABNORMAL LOW (ref 3.5–5.0)
Anion gap: 10 (ref 5–15)
BUN: 26 mg/dL — ABNORMAL HIGH (ref 8–23)
CO2: 26 mmol/L (ref 22–32)
Calcium: 9.2 mg/dL (ref 8.9–10.3)
Chloride: 100 mmol/L (ref 98–111)
Creatinine, Ser: 3.36 mg/dL — ABNORMAL HIGH (ref 0.44–1.00)
GFR, Estimated: 14 mL/min — ABNORMAL LOW (ref >60)
Glucose, Bld: 172 mg/dL — ABNORMAL HIGH (ref 70–99)
Phosphorus: 3.4 mg/dL (ref 2.5–4.6)
Potassium: 4 mmol/L (ref 3.5–5.1)
Sodium: 136 mmol/L (ref 135–145)

## 2021-12-12 LAB — GLUCOSE, CAPILLARY
Glucose-Capillary: 124 mg/dL — ABNORMAL HIGH (ref 70–99)
Glucose-Capillary: 137 mg/dL — ABNORMAL HIGH (ref 70–99)
Glucose-Capillary: 83 mg/dL (ref 70–99)
Glucose-Capillary: 315 mg/dL — ABNORMAL HIGH (ref 70–99)

## 2021-12-12 LAB — CBC
HCT: 26.3 % — ABNORMAL LOW (ref 36.0–46.0)
Hemoglobin: 8.4 g/dL — ABNORMAL LOW (ref 12.0–15.0)
MCH: 28.6 pg (ref 26.0–34.0)
MCHC: 31.9 g/dL (ref 30.0–36.0)
MCV: 89.5 fL (ref 80.0–100.0)
Platelets: 123 10*3/uL — ABNORMAL LOW (ref 150–400)
RBC: 2.94 MIL/uL — ABNORMAL LOW (ref 3.87–5.11)
RDW: 15.5 % (ref 11.5–15.5)
WBC: 15.1 10*3/uL — ABNORMAL HIGH (ref 4.0–10.5)
nRBC: 0 % (ref 0.0–0.2)

## 2021-12-12 MED ORDER — SODIUM CHLORIDE 0.9 % IV SOLN: 100.0000 mL | INTRAVENOUS | Status: DC | PRN

## 2021-12-12 MED ORDER — LIDOCAINE-PRILOCAINE 2.5-2.5 % EX CREA: 1.0000 "application " | TOPICAL_CREAM | CUTANEOUS | Status: DC | PRN

## 2021-12-12 MED ORDER — HEPARIN SODIUM (PORCINE) 1000 UNIT/ML IJ SOLN
INTRAMUSCULAR | Status: AC
  Filled 2021-12-12: qty 3

## 2021-12-12 MED ORDER — HEPARIN SODIUM (PORCINE) 1000 UNIT/ML DIALYSIS
1000.0000 [IU] | INTRAMUSCULAR | Status: DC | PRN
  Administered 2021-12-12: 3200 [IU] via INTRAVENOUS_CENTRAL

## 2021-12-12 MED ORDER — HEPARIN SODIUM (PORCINE) 1000 UNIT/ML DIALYSIS: 20.0000 [IU]/kg | INTRAMUSCULAR | Status: DC | PRN

## 2021-12-12 MED ORDER — LIDOCAINE HCL (PF) 1 % IJ SOLN: 5.0000 mL | INTRAMUSCULAR | Status: DC | PRN

## 2021-12-12 MED ORDER — PENTAFLUOROPROP-TETRAFLUOROETH EX AERO: 1.0000 "application " | INHALATION_SPRAY | CUTANEOUS | Status: DC | PRN

## 2021-12-12 MED ORDER — ALTEPLASE 2 MG IJ SOLR: 2.0000 mg | Freq: Once | INTRAMUSCULAR | Status: DC | PRN

## 2021-12-12 NOTE — Progress Notes (Signed)
Occupational Therapy Session Note  Patient Details  Name: [name] MRN: [id number] Date of Birth: 09-12-1951  Today's Date: 12/12/2021 OT Individual Time: 7846-9629 OT Individual Time Calculation (min): 60 min    Short Term Goals: Week 2:  OT Short Term Goal 1 (Week 2): STG = LTG 2/2 LOS  Skilled Therapeutic Interventions/Progress Updates:  Pt greeted supine in bed agreeable to OT intervention. Session focus on BADL reeducation, functional mobility, dynamic standing balance and decreasing overall caregiver burden.   Pt completed supine>sit with CGA with increased time and effort and MOD cues for sequencing task. Pt completed UB bathing with overall MIN A from EOB but then abruptly returns self to supine reporting fatigue. Pt returned back to sitting with CGA where OTA washed pts back; MD enter for rounding. Pt returned self to supine reporting pain in R ankle requesting TEDS to be donned, total A to don TEDS. Pt asking who was her doctor right after Dr. Dagoberto Ligas left, education provided, pt then reports she needs 15 mins to rest in between LB /UB bathing. Discussed pts goals while she was resting with pt reporting she wants to be able to climb the steps, encouraged pt to try more OOB activity if that is her goal. Discussed bathing/dresssing routine at home with pt reporting she will likely bath/dress from sink.  NT enter to change pts sheets and pt then agreeable to transition OOB. Pt completed sit<>stand with stedy with MIN A +2. Pt stood for pericare with CGA in stedy while pt washed her front/back. Pt transported to recliner with total A via stedy. MAX A to don pants in stedy. Pt left seated in recliner with alarm belt activated and all needs within reach.   Pt very slow to initiate movements needing extended time in between transitions.   Therapy Documentation Precautions:  Precautions Precautions: Fall, Other (comment) Precaution Comments: cortrak; right leg discomfort -  PTA Restrictions Weight Bearing Restrictions: No  Pain: unrated pain in RLE rest breaks and repositioning provided as needed, provided TEDS for comfort per pt request.     Therapy/Group: Individual Therapy  Corinne Ports [facility] 12/12/2021, 10:59 AM

## 2021-12-12 NOTE — Progress Notes (Signed)
Nutrition Follow-up  DOCUMENTATION CODES:   Not applicable  INTERVENTION:  Continue Nepro Shake po TID, each supplement provides 425 kcal and 19 grams protein.  Continue nocturnal TF via Cortrak NGT using Osmolite 1.5 cal formula at rate of 75 ml/h x 13 hours (6pm-7am)  Continue Pro-source TF 45 ml BID per tube.   Nocturnal tube feeds provides 1543 kcal, 83 gm protein, 743 ml free water. This meets 79% of estimated calories needs and 83% of minimum estimated protein needs.   NUTRITION DIAGNOSIS:   Increased nutrient needs related to chronic illness (renal failure on HD) as evidenced by estimated needs; ongoing  GOAL:   Patient will meet greater than or equal to 90% of their needs; progressing  MONITOR:   PO intake, Supplement acceptance, Labs, Weight trends, Skin, I & O's, Diet advancement  REASON FOR ASSESSMENT:   Consult Calorie Count  ASSESSMENT:   71 year old female with history of asthma, diabetes mellitus, fibromyalgia, hypertension, hyperlipidemia. Presents after loss of consciousness. Pt with functional deficits secondary to debility related to VF cardiac arrest secondary to massive pulmonary emboli status post mechanical thrombectomy. Pt with renal failure secondary to ATN and hemodialysis initiated and awaiting plan for long-term hemodialysis. Admitted to CIR. Cortrak placed 1/20. Tip of tube in stomach.  Cortrak NGT replaced today as previous tube was clogged. Tip of new tube in stomach. Plans to restart nocturnal tube feeds tonight as pt continues with poor po intake. Meal completion 10% at meals. Pt currently has Nepro shake with varied consumption. RD to continue with nutritional supplementation to aid in PO intake. Medication ordered to aid in BM. Noted last BM 2/10.   Labs and medications reviewed.   Diet Order:   Diet Order             DIET DYS 2 Room service appropriate? Yes; Fluid consistency: Thin  Diet effective now                   EDUCATION  NEEDS:   Not appropriate for education at this time  Skin:  Skin Assessment: Skin Integrity Issues: Skin Integrity Issues:: Stage II, Other (Comment) Stage II: L sternum Other: MASD to buttocks, skin tear to buttock and R elbow  Last BM:  2/10  Height:   Ht Readings from Last 1 Encounters:  12/04/21 5\' 5"  (1.651 m)    Weight:   Wt Readings from Last 1 Encounters:  12/12/21 66.1 kg    BMI:  Body mass index is 24.25 kg/m.  Estimated Nutritional Needs:   Kcal:  1950-2150  Protein:  100-125 grams  Fluid:  1L + UOP  Corrin Parker, MS, RD, LDN RD pager number/after hours weekend pager number on Amion.

## 2021-12-12 NOTE — Progress Notes (Signed)
Occupational Therapy Session Note  Patient Details  Name: [name] MRN: [id number] Date of Birth: 1951/05/30  Today's Date: 12/12/2021 OT Individual Time: 1115-1155 OT Individual Time Calculation (min): 40 min    Short Term Goals: Week 2:  OT Short Term Goal 1 (Week 2): STG = LTG 2/2 LOS  Skilled Therapeutic Interventions/Progress Updates:    Pt resting in bed upon arrival, lying on her Lt side. Pt greeted OTA cheerfully but c/o increased pain when attempting to sit EOB. Supine>sit EOB with max A and min A for sitting balance. Pt not oriented to place or date. Attempted sit<>stand X 3 with Stedy but required tot A and pt unable to maintain standing in [place]. Pt required extended rest breaks between each trial. Pt returned to bed. Pt remained in bed with all needs within reach. Bed alarm activated.   Therapy Documentation Precautions:  Precautions Precautions: Fall, Other (comment) Precaution Comments: cortrak; right leg discomfort - PTA Restrictions Weight Bearing Restrictions: No Pain:  Pt c/o generalized pain but unable to quantify or localize   Therapy/Group: Individual Therapy  Leroy Libman 12/12/2021, 2:07 PM

## 2021-12-12 NOTE — Progress Notes (Signed)
Patient had a BM and urinated in [facility]. Patient became aggressive when trying to transfer back to bed, grabbing NT by the shirt and swatting at nurse. Patient refused PVR bladder scan and attempted to call 911. NT removed patients phones. Call bell in reach and bed alarm on.

## 2021-12-12 NOTE — Progress Notes (Signed)
Physical Therapy Weekly Progress Note  Patient Details  Name: [name] MRN: [id number] Date of Birth: [birth year]  Beginning of progress report period: December 05, 2021 End of progress report period: December 12, 2021  Today's Date: 12/12/2021 PT Individual Time: 0915-1000 PT Individual Time Calculation (min): 45 min   Patient has met 2 of 3 short term goals.  Pt is making very slow progress towards therapy goals.   Patient continues to demonstrate the following deficits muscle weakness, decreased cardiorespiratoy endurance, impaired timing and sequencing, unbalanced muscle activation, and decreased motor planning, decreased initiation, decreased attention, decreased awareness, decreased problem solving, decreased safety awareness, decreased memory, and delayed processing, and decreased sitting balance, decreased standing balance, decreased postural control, and decreased balance strategies and therefore will continue to benefit from skilled PT intervention to increase functional independence with mobility.  Patient not progressing toward long term goals.  See goal revision..  Plan of care revisions: downgraded balance and transfer goals to mod A, d/c gait and car transfer goals due to slow progress.  PT Short Term Goals Week 1:  PT Short Term Goal 1 (Week 1): Pt will initiate standing with RW PT Short Term Goal 1 - Progress (Week 1): Not met PT Short Term Goal 2 (Week 1): Pt will tolerate sitting OOB between sessions PT Short Term Goal 2 - Progress (Week 1): Met PT Short Term Goal 3 (Week 1): Pt will tolerate x 5 min therapeutic exercise PT Short Term Goal 3 - Progress (Week 1): Met Week 2:  PT Short Term Goal 1 (Week 2): =LTG due to ELOS  Skilled Therapeutic Interventions/Progress Updates:    Pt received seated in recliner in room, agreeable to PT session. Pt appears with brighter affect and more engaged in therapy session initially. Pt reports pain in her feet, utilizing lidocaine  patches for pain management. Seated BLE strengthening therex: marches, LAQ, hip add squeeze x 10 reps each. Pt reports pain in groin area from foley catheter, catheter found to be wrapped around her leg. Sit to stand with mod A to stedy to adjust catheter for improved comfort and positioning. Pt then declines any further standing this session, declines to transfer to w/c in order to go therapy gym to work on standing and increasing independence with functional mobility. Educated pt on importance of participation therapy sessions to gain strength and independence, pt declines participation. Pt missed 30 min of scheduled therapy session due to refusal to participate.  Therapy Documentation Precautions:  Precautions Precautions: Fall, Other (comment) Precaution Comments: cortrak; right leg discomfort - PTA Restrictions Weight Bearing Restrictions: No General: PT Amount of Missed Time (min): 30 Minutes PT Missed Treatment Reason: Patient unwilling to participate    Therapy/Group: Individual Therapy   Excell Seltzer, PT, DPT, CSRS  12/12/2021, 3:34 PM

## 2021-12-12 NOTE — Progress Notes (Signed)
PROGRESS NOTE   Subjective/Complaints:  Pt making jokes- however wants to get dressed even though in the middle of being bathed with OT-  Didn't have BM with Sorbitol  That was given yesterday-  Will order KUB. KUB shows normal gas pattern- likely had BM with therapy?   ROS: Pt denies SOB, abd pain, CP, N/V/(+) C/D, and vision changes   Objective:   DG Abd Portable 1V  Result Date: 12/12/2021 CLINICAL DATA:  Feeding tube placement EXAM: PORTABLE ABDOMEN - 1 VIEW COMPARISON:  None. FINDINGS: Feeding tube in the mid to distal body of the stomach. Normal bowel gas pattern.  No abnormal calcifications. IMPRESSION: Feeding tube tip in the mid distal body of the stomach. Electronically Signed   By: Franchot Gallo M.D.   On: 12/12/2021 11:49   Recent Labs    12/10/21 0516 12/12/21 1216  WBC 12.5* 15.1*  HGB 8.2* 8.4*  HCT 25.4* 26.3*  PLT 83* 123*   Recent Labs    12/10/21 0516 12/12/21 1216  NA 139 136  K 3.9 4.0  CL 104 100  CO2 24 26  GLUCOSE 260* 172*  BUN 38* 26*  CREATININE 3.92* 3.36*  CALCIUM 8.8* 9.2    Intake/Output Summary (Last 24 hours) at 12/12/2021 1426 Last data filed at 12/12/2021 0700 Gross per 24 hour  Intake 156 ml  Output 950 ml  Net -794 ml     Pressure Injury 12/04/21 Head Posterior Unstageable - Full thickness tissue loss in which the base of the injury is covered by slough (yellow, tan, gray, green or brown) and/or eschar (tan, brown or black) in the wound bed. 3 cm round, scabbed area to po (Active)  12/04/21 2000  Location: Head  Location Orientation: Posterior  Staging: Unstageable - Full thickness tissue loss in which the base of the injury is covered by slough (yellow, tan, gray, green or brown) and/or eschar (tan, brown or black) in the wound bed.  Wound Description (Comments): 3 cm round, scabbed area to posterior head.  Present on Admission: Yes    Physical Exam: Vital  Signs Blood pressure 125/73, pulse 100, temperature (!) 97.5 F (36.4 C), temperature source Temporal, resp. rate 16, height 5\' 5"  (1.651 m), weight 66.1 kg, SpO2 98 %.     General: awake, alert, appropriate, sitting up at EOB; joking, then irritated about people coming in during bathing; NAD HENT: conjugate gaze; oropharynx moist CV: borderline tachycardic rate; no JVD Pulmonary: decreased at bases, but no W/R/R GI: soft, NT, ND, (+)BS- less protuberant; hypoactive Psychiatric: appropriate Neurological: alert, but poor memory and tangential- significantly- Skin: unstageable wound with scabbing on back of head 3-4cm in diameter- also has popped/dry blister on L sternum- slough gone from base- looks better Genitourinary:    Comments: Foley tube in place- medium clear, yellow urine in bag- no change Musculoskeletal:     Cervical back: Neck supple. No tenderness.     Comments: UE 5-/5 B/L  LE 3/5 HF/KE- DF 4-/5 on L and 2-/5 on R; PF 4-/5 B/L  R foot drop- at rest in PF   Skin:    General: Skin is warm and dry. Wound on occiput/matted  with hair    Comments: R chest HD catheter  Neuro: Mild tremors with movement. Reasonable sitting balance Numb/absent/decreased sensation from mid calf to toes B/L L>R Vague- Ox2 with cues   Assessment/Plan: 1. Functional deficits which require 3+ hours per day of interdisciplinary therapy in a comprehensive inpatient rehab setting. Physiatrist is providing close team supervision and 24 hour management of active medical problems listed below. Physiatrist and rehab team continue to assess barriers to discharge/monitor patient progress toward functional and medical goals  Care Tool:  Bathing    Body parts bathed by patient: Right arm, Left arm, Chest, Right upper leg, Left upper leg, Face, Front perineal area, Abdomen, Buttocks   Body parts bathed by helper: Front perineal area, Buttocks, Right lower leg, Left lower leg     Bathing assist Assist  Level: Minimal Assistance - Patient > 75%     Upper Body Dressing/Undressing Upper body dressing   What is the patient wearing?: Pull over shirt    Upper body assist Assist Level: Minimal Assistance - Patient > 75%    Lower Body Dressing/Undressing Lower body dressing      What is the patient wearing?: Pants     Lower body assist Assist for lower body dressing: Maximal Assistance - Patient 25 - 49%     Toileting Toileting Toileting Activity did not occur (Clothing management and hygiene only): N/A (no void or bm)  Toileting assist Assist for toileting: Maximal Assistance - Patient 25 - 49%     Transfers Chair/bed transfer  Transfers assist     Chair/bed transfer assist level: 2 Helpers (MIN A +2 stand to stedy)     Locomotion Ambulation   Ambulation assist   Ambulation activity did not occur: Safety/medical concerns          Walk 10 feet activity   Assist  Walk 10 feet activity did not occur: Safety/medical concerns        Walk 50 feet activity   Assist Walk 50 feet with 2 turns activity did not occur: Safety/medical concerns         Walk 150 feet activity   Assist Walk 150 feet activity did not occur: Safety/medical concerns         Walk 10 feet on uneven surface  activity   Assist Walk 10 feet on uneven surfaces activity did not occur: Safety/medical concerns         Wheelchair     Assist Is the patient using a wheelchair?: Yes Type of Wheelchair: Manual    Wheelchair assist level: Minimal Assistance - Patient > 75% Max wheelchair distance: 100'    Wheelchair 50 feet with 2 turns activity    Assist        Assist Level: Minimal Assistance - Patient > 75%   Wheelchair 150 feet activity     Assist      Assist Level: Dependent - Patient 0%   Blood pressure 125/73, pulse 100, temperature (!) 97.5 F (36.4 C), temperature source Temporal, resp. rate 16, height 5\' 5"  (1.651 m), weight 66.1 kg, SpO2 98  %.  Medical Problem List and Plan: 1. Functional deficits secondary to debility related to VF cardiac arrest secondary to massive pulmonary emboli status post mechanical thrombectomy             -patient may not shower due to HD catheter- unless it's removed             -ELOS/Goals: 10-14 days supervision to min A for PT/OT  SLP  -con't CIR- Continue CIR- PT, OT and SLP  Will set up family conference Thursday- likely needs SNF - con't CIR- PT, OT and SLP- cannot stay on task  2.  Antithrombotics: -DVT/anticoagulation:  Pharmaceutical: Other (comment) Eliquis             -antiplatelet therapy: N/A 3. Pain Management: Neurontin 100 mg twice daily, oxycodone as needed  2/9- had been on gabapentin for nerve pain-will stop due to sedation- will start RLE lidoderm patch 2 patches for pain.   2/13  more alert off gabapentin in spite of being on at home prior for neuropathy 4. Mood anxiety-  Cymbalta 20 mg daily, Xanax 0.25 mg daily as needed provide emotional support             -antipsychotic agents: N/A 5. Neuropsych: This patient is? capable of making decisions on her own behalf. 6. Skin/Wound Care: Routine skin checks 7. Fluids/Electrolytes/Nutrition: still not eating much  -continue TF for now.   -hopefully po intake will pick up as mentation improves.  8.  Retroperitoneal bleed/hemorrhagic shock/acute blood loss anemia/status post left liver lobe embolization  2/9- will check LFTs tomorrow to make sure liver function doing OK?  2/11- LFTs look great- con't to monitor every so often 9.  AKI secondary to ATN.  Follow-up per renal services.  Started clip for AKI- not ESRD, however Cr almost 5 today- getting HD tomorrow 2/8.   2/9- last Cr 5.74- up from 4.97 up from 4.45- rising- per renal- likely HD tomorrow?.   2/13- Cr 3.9 today- per renal   2/15- Renal is allowing [name] to remove Foley-  10.  Thrombocytopenia.  Suspect ITP.  Follow-up per hematology  2/13- plts 83k. Continue to monitor (has  ranged between 60k and 110k)  2/15- Plts up to 123k- con't to monitor 11.  Hyperlipidemia.  Lipitor 12. Diabetes- Poor appetite- has Cortrak for intake due to poor appetite-stop insulin- was on at home- levemir 4 units BID when came to rehab, but dropping CBGs- so will hold- will do CBGs BID Will assess if still needs- d/c insulin due to Tfs  2/11- TF's restarted so CBGs going up- 486 this AM- restarted Levemir 4 units BID and SSI- will titrate as required.   2/12- got called about Bgs in 400s- again this AM- due to TF's- will increase Levemir to 8 units BID and monitor-   2/13 levemir just increased--observe today  2/14- will consult DM coordiantor to help- d/c'd boost breeze- too many carbs-   2/15- CBGs 77-177- with Novolog 2 units q4 hours while on TF's 13. New onset confusion/poor memory- anoxia? From prolonged CPR  2/9- will check CT of head- and stop Gabapentin for nerve pain due to increased sedation- had before but so out of it per daughter- much worse last 2 days.   2/15- variable -tangential and  poor memory- will need to d/w daughter 82. ABLA with chronic anemia  2/13 hb stable at 8.2 15. Scab on back of head-pressure ulcer unstageable/Blister on sternum   2/9- on the back of her head  2/10- will need to have hair washed to see if we can see wound that's on CT  2/13 [place] RN examined wound today --peroxide and mupirocin bid with dry dressing  2/15- touched base with nursing- stable- unstageable on back of head- blister healing 16. HTN with hx of VF arrest  2/10- pt's BP 178/95- running [id number]- systolic- will d/w renal since not on any BP meds-  2/11- Started norvasc and BP looks better 316 systolic- will con't to monitor trend.   2/13 bp borderline but improved from baseline--continue curent meds  2/14- BP borderline, - con't regimen 17. Constipation  2/14- will give sorbitol- 30 cc after therapy- 2pm  2/15- KUB shows normal bowel gas pattern- no constipation 18.  Leukocytosis  2/15- will remove foley and bladder scan; also check U/A and Cx- foley was per renal .    I spent a total of  35  minutes on total care today- >50% coordination of care- due to arranging family conference tomorrow and reviewing all the notes.         LOS: 8 days A FACE TO FACE EVALUATION WAS PERFORMED  Luverna Degenhart 12/12/2021, 2:26 PM

## 2021-12-12 NOTE — Progress Notes (Signed)
Patient had fall from bed to floor at 2245. Patient found on butt next to bed, alert and oriented to base line with no complaints of pain except for her right great toe that she has had pain in previously. Patient assisted back to bed by nursing staff, vital signs checked and patient reassessed, no signs of injury. Zella Ball, [place] notified and stat head CT ordered due to patients baseline confusion. Patient refused to go to wear mask and go to CT. Patient's daughter [name] notified of fall and refusal and asked that CT be scheduled for tomorrow morning once she gets here. Zella Ball agreed CT postponed.

## 2021-12-12 NOTE — Progress Notes (Signed)
[name] PROGRESS NOTE  Assessment/ Plan:  #Acute Hahn injury secondary due to ischemic ATN in the setting of cardiac arrest, shock and contrast.  She has underlying CKD with baseline creatinine level around 1.3 back in 2021.  She has been RRT dependent since 10/31/2021 which was discussed with the patient and family.  Now receiving HD per MWF schedule.  See already has a spot at [facility] TTS schedule second shift for AKI.   Plan for regular dialysis today. We will discontinue Foley catheter today and bladder scan.  Discussed with the nurse.  #V-fib cardiac arrest outside the hospital due to massive PE.  #Massive PE status post mechanical thrombectomy on 1/2. Vasc US- no DVT, liver mass noted 10/28/21 CT abd/ pelvis, s/p MRI abd--> incompletely characterized, had MRI with gadolinium 1/20  to reevaluate -- not definitive. S/p hem/onc eval.  # Retroperitoneal bleed/Hemorrhagic shock/Acute blood loss anemia originating from left liver lobe s/p embolization 1/4--> required FFP, plts, Vit K, pRBCs; now stable. Hematology following - no ESA due to concern for her recent thromboembolic disease. Transfuse when needed   # Thrombocytopenia- suspected ITP, s/p Nplate PRN and dex. -  work up and mgmt per heme.  # Acute hypoxic RF- extubated 11/07/21, now much improved  Subjective: Seen and examined.  No new event.  Denies nausea, vomiting, chest pain or shortness of breath.  We will plan to DC Foley catheter and do bladder scan.  Discussed with the nurse Objective Vital signs in last 24 hours: Vitals:   12/11/21 0507 12/11/21 1411 12/11/21 2013 12/12/21 0306  BP: (!) 162/82 129/69 (!) 150/66 (!) 145/82  Pulse: 100 80 (!) 102 (!) 104  Resp: 16 18 16 20   Temp: 98.3 F (36.8 C) 98.5 F (36.9 C) 98.6 F (37 C) 98.6 F (37 C)  TempSrc: Oral Oral Oral Oral  SpO2: 98% 97% 97% 100%  Weight: 69.2 kg   66.5 kg  Height:       Weight change: -1.5 kg  Intake/Output Summary (Last 24  hours) at 12/12/2021 0909 Last data filed at 12/12/2021 9937 Gross per 24 hour  Intake 136 ml  Output 950 ml  Net -814 ml        Labs: Basic Metabolic Panel: Recent Labs  Lab 12/08/21 0527 12/09/21 0610 12/10/21 0516  NA 136 138 139  K 3.9 4.0 3.9  CL 98 102 104  CO2 26 24 24   GLUCOSE 466* 425* 260*  BUN 18 30* 38*  CREATININE 2.59* 3.56* 3.92*  CALCIUM 8.4* 8.4* 8.8*  PHOS 2.4* 3.1 3.0    Liver Function Tests: Recent Labs  Lab 12/07/21 0525 12/08/21 0527 12/09/21 0610 12/10/21 0516  AST 27  --   --   --   ALT 28  --   --   --   ALKPHOS 144*  --   --   --   BILITOT 1.0  --   --   --   PROT 6.5  --   --   --   ALBUMIN 2.7* 2.8* 2.6* 2.9*    No results for input(s): LIPASE, AMYLASE in the last 168 hours. No results for input(s): AMMONIA in the last 168 hours. CBC: Recent Labs  Lab 12/07/21 0525 12/10/21 0516  WBC 9.1 12.5*  NEUTROABS 6.6  --   HGB 8.4* 8.2*  HCT 25.7* 25.4*  MCV 88.9 90.1  PLT 110* 83*    Cardiac Enzymes: No results for input(s): CKTOTAL, CKMB, CKMBINDEX, TROPONINI in  the last 168 hours. CBG: Recent Labs  Lab 12/11/21 0937 12/11/21 1203 12/11/21 1631 12/11/21 2131 12/12/21 0658  GLUCAP 329* 239* 75 177* 124*     Iron Studies: No results for input(s): IRON, TIBC, TRANSFERRIN, FERRITIN in the last 72 hours. Studies/Results: No results found.  Medications: Infusions:   Scheduled Medications:  amLODipine  10 mg Oral Q2000   apixaban  5 mg Oral BID   atorvastatin  20 mg Oral Daily   B-complex with vitamin C  1 tablet Oral Daily   chlorhexidine  15 mL Mouth Rinse BID   Chlorhexidine Gluconate Cloth  6 each Topical Q0600   dicyclomine  10 mg Oral TID AC   DULoxetine  20 mg Oral Daily   feeding supplement (NEPRO CARB STEADY)  237 mL Oral TID BM   feeding supplement (OSMOLITE 1.5 CAL)  975 mL Per Tube Q24H   feeding supplement (PROSource TF)  45 mL Per Tube BID   folic acid  2 mg Oral Daily   heparin sodium (porcine)   3,200 Units Intracatheter Once   insulin aspart  0-5 Units Subcutaneous QHS   insulin aspart  0-9 Units Subcutaneous TID WC   insulin detemir  8 Units Subcutaneous BID   lidocaine  2 patch Transdermal Q24H   megestrol  800 mg Oral Daily   mupirocin cream  1 application Topical BID   pantoprazole  40 mg Oral BID   senna  2 tablet Oral Daily    have reviewed scheduled and prn medications.  Physical Exam: General:NAD, comfortable, nasal feeding tube in place Heart:RRR, s1s2 nl Lungs: Clear b/l, no crackle Abdomen:soft, Non-tender, non-distended Extremities:No edema Dialysis Access: Right IJ TDC  Kensie Susman Tanna Furry 12/12/2021,9:09 AM  LOS: 8 days

## 2021-12-12 NOTE — Procedures (Signed)
Cortrak  Person Inserting Tube:  Alroy Dust, Chiana Wamser L, RD Tube Type:  Cortrak - 43 inches Tube Size:  10 Tube Location:  Left nare Initial Placement:  Stomach Secured by: Bridle Technique Used to Measure Tube Placement:  Marking at nare/corner of mouth Cortrak Secured At:  55 cm  Cortrak Tube Team Note:  Consult received to place a Cortrak feeding tube.   X-ray is required, abdominal x-ray has been ordered by the Cortrak team. Please confirm tube placement before using the Cortrak tube.   If the tube becomes dislodged please keep the tube and contact the Cortrak team at [email or web address] ([email or web address]) for replacement.  If after hours and replacement cannot be delayed, place a NG tube and confirm placement with an abdominal x-ray.    Roxana Hires, RD, LDN Clinical Dietitian See [facility] for contact information.

## 2021-12-12 NOTE — Progress Notes (Signed)
Cortrak team contacted. Informed of pts therapy schedule. States will see pt around 1030-1130.

## 2021-12-12 NOTE — Progress Notes (Signed)
Patient became confused shortly after her daughter left and continued to attempt to get out of bed. NT called nurse to room as patient was worked up and heart rate was 110. Patient assessed, redirected and given xanax for anxiety. Patient now calm resting in bed.

## 2021-12-12 NOTE — Plan of Care (Signed)
°  Problem: RH Balance Goal: LTG Patient will maintain dynamic standing balance (PT) Description: LTG:  Patient will maintain dynamic standing balance with assistance during mobility activities (PT) Flowsheets (Taken 12/12/2021 1622) LTG: Pt will maintain dynamic standing balance during mobility activities with:: (downgrade due to slow progress) Moderate Assistance - Patient 50 - 74% Note: downgrade due to slow progress   Problem: Sit to Stand Goal: LTG:  Patient will perform sit to stand with assistance level (PT) Description: LTG:  Patient will perform sit to stand with assistance level (PT) Flowsheets (Taken 12/12/2021 1622) LTG: PT will perform sit to stand in preparation for functional mobility with assistance level: (downgrade due to slow progress) Moderate Assistance - Patient 50 - 74% Note: downgrade due to slow progress   Problem: RH Bed to Chair Transfers Goal: LTG Patient will perform bed/chair transfers w/assist (PT) Description: LTG: Patient will perform bed to chair transfers with assistance (PT). Flowsheets (Taken 12/12/2021 1622) LTG: Pt will perform Bed to Chair Transfers with assistance level: (downgrade due to slow progress) Moderate Assistance - Patient 50 - 74% Note: downgrade due to slow progress   Problem: RH Car Transfers Goal: LTG Patient will perform car transfers with assist (PT) Description: LTG: Patient will perform car transfers with assistance (PT). Flowsheets (Taken 12/12/2021 1622) LTG: Pt will perform car transfers with assist:: (d/c goal due to slow progress) -- Note: D/c goal due to slow progress   Problem: RH Ambulation Goal: LTG Patient will ambulate in controlled environment (PT) Description: LTG: Patient will ambulate in a controlled environment, # of feet with assistance (PT). Flowsheets (Taken 12/12/2021 1622) LTG: Pt will ambulate in controlled environ  assist needed:: (d/c goal due to slow progress) -- Note: D/c goal due to slow progress

## 2021-12-13 LAB — CBC WITH DIFFERENTIAL/PLATELET
Abs Immature Granulocytes: 0.09 10*3/uL — ABNORMAL HIGH (ref 0.00–0.07)
Basophils Absolute: 0.1 10*3/uL (ref 0.0–0.1)
Basophils Relative: 1 %
Eosinophils Absolute: 0.1 10*3/uL (ref 0.0–0.5)
Eosinophils Relative: 1 %
HCT: 27.2 % — ABNORMAL LOW (ref 36.0–46.0)
Hemoglobin: 8.6 g/dL — ABNORMAL LOW (ref 12.0–15.0)
Immature Granulocytes: 1 %
Lymphocytes Relative: 15 %
Lymphs Abs: 2.3 10*3/uL (ref 0.7–4.0)
MCH: 28.4 pg (ref 26.0–34.0)
MCHC: 31.6 g/dL (ref 30.0–36.0)
MCV: 89.8 fL (ref 80.0–100.0)
Monocytes Absolute: 1.4 10*3/uL — ABNORMAL HIGH (ref 0.1–1.0)
Monocytes Relative: 9 %
Neutro Abs: 10.9 10*3/uL — ABNORMAL HIGH (ref 1.7–7.7)
Neutrophils Relative %: 73 %
Platelets: 139 10*3/uL — ABNORMAL LOW (ref 150–400)
RBC: 3.03 MIL/uL — ABNORMAL LOW (ref 3.87–5.11)
RDW: 15.2 % (ref 11.5–15.5)
WBC: 14.9 10*3/uL — ABNORMAL HIGH (ref 4.0–10.5)
nRBC: 0 % (ref 0.0–0.2)

## 2021-12-13 LAB — URINALYSIS, ROUTINE W REFLEX MICROSCOPIC
Bilirubin Urine: NEGATIVE
Glucose, UA: >=500 mg/dL — AB
Hgb urine dipstick: NEGATIVE
Ketones, ur: NEGATIVE mg/dL
Nitrite: NEGATIVE
Protein, ur: 100 mg/dL — AB
Specific Gravity, Urine: 1.01 (ref 1.005–1.030)
WBC, UA: >50 WBC/hpf — ABNORMAL HIGH (ref 0–5)
pH: 9 — ABNORMAL HIGH (ref 5.0–8.0)

## 2021-12-13 LAB — GLUCOSE, CAPILLARY
Glucose-Capillary: 387 mg/dL — ABNORMAL HIGH (ref 70–99)
Glucose-Capillary: 156 mg/dL — ABNORMAL HIGH (ref 70–99)
Glucose-Capillary: 142 mg/dL — ABNORMAL HIGH (ref 70–99)

## 2021-12-13 MED ORDER — SODIUM CHLORIDE 0.9 % IV SOLN
1.0000 g | Freq: Every day | INTRAVENOUS | Status: DC
  Administered 2021-12-13 – 2021-12-16 (×4): 1 g via INTRAVENOUS
  Filled 2021-12-13 (×5): qty 1

## 2021-12-13 MED ORDER — CHLORHEXIDINE GLUCONATE CLOTH 2 % EX PADS
6.0000 | MEDICATED_PAD | Freq: Every day | CUTANEOUS | Status: DC
  Administered 2021-12-13 – 2021-12-20 (×7): 6 via TOPICAL

## 2021-12-13 MED ORDER — SODIUM CHLORIDE 0.9 % IV SOLN
INTRAVENOUS | Status: DC | PRN
Start: 2021-12-13 — End: 2022-01-07
  Administered 2021-12-13: 10 mL/h via INTRAVENOUS

## 2021-12-13 NOTE — Progress Notes (Signed)
Pt with a recent culture of pan sensitive pseudomonas on 2/1. Pt became confused and combative last night. Dr. Dagoberto Ligas would like to cover her for 5d of abx. We will use cefepime 1g IV q24 x5d.  Onnie Boer, PharmD, BCIDP, AAHIVP, CPP Infectious Disease Pharmacist 12/13/2021 11:02 AM

## 2021-12-13 NOTE — Progress Notes (Signed)
Patient refused to have blood sugar. Last charted urination was at 10 am. This nurse was told in report that patient was cathed for a urinalysis that resulted at 0956 this am. No documentation of urination since that time, patient allowed bladder scan and scanned for 149. Patient resting and adamant that she does not need to use the restroom and does not want to be cathed. Spoke with both daughters and and neither recall patient urinating. Daughters would like to wait to cath the patient and bladder scan again in the morning.

## 2021-12-13 NOTE — Progress Notes (Incomplete)
Patient ID: [name], female   DOB: [birth year], 71 y.o.   MRN: [id number]  Patient/Family Conference  Patient/family in attendance:  Staff in attendance:  Main focus:  Synopsis of information shared:  Barriers/concerns expressed by patient and family:  Patient/family response:  Follow-up/action plans:    [name], MSW, [place] Office: [phone] Cell: [phone] Fax: [phone]

## 2021-12-13 NOTE — Significant Event (Signed)
**  Late Entry**  12/12/21 2250  What Happened  Was fall witnessed? No  Was patient injured? Unsure  Patient found on floor  Found by Staff-comment Serena Croissant LPN)  Follow Up  MD notified Danella Sensing  Time MD notified 2300  Family notified Yes - comment  Time family notified 2330  Additional tests Yes-comment (CT head)  Adult Fall Risk Assessment  Risk Factor Category (scoring not indicated) Fall has occurred during this admission (document High fall risk)  Age 71  Fall History: Fall within 6 months prior to admission 5  Elimination; Bowel and/or Urine Incontinence 0  Elimination; Bowel and/or Urine Urgency/Frequency 0  Medications: includes PCA/Opiates, Anti-convulsants, Anti-hypertensives, Diuretics, Hypnotics, Laxatives, Sedatives, and Psychotropics 5  Patient Care Equipment 2  Mobility-Assistance 2  Mobility-Gait 2  Mobility-Sensory Deficit 0  Altered awareness of immediate physical environment 1  Impulsiveness 2  Lack of understanding of one's physical/cognitive limitations 0  Total Score 21  Patient Fall Risk Level High fall risk  Adult Fall Risk Interventions  Required Bundle Interventions *See Row Information* High fall risk - low, moderate, and high requirements implemented  Additional Interventions Use of appropriate toileting equipment (bedpan, BSC, etc.)  Screening for Fall Injury Risk (To be completed on HIGH fall risk patients) - Assessing Need for Floor Mats  Risk For Fall Injury- Criteria for Floor Mats None identified - No additional interventions needed  Will Implement Floor Mats Yes  Vitals  Temp 98.9 F (37.2 C)  Temp Source Oral  BP 133/75  MAP (mmHg) 90  BP Location Left Arm  BP Method Automatic  Patient Position (if appropriate) Lying  Pulse Rate (!) 109  Resp 20  Oxygen Therapy  SpO2 100 %  O2 Device Room Air  Neurological  Neuro (WDL) X  Level of Consciousness Alert  Orientation Level Oriented to person;Oriented to time  Cognition  Appropriate at baseline  Speech Clear  R Hand Grip Moderate  L Hand Grip Moderate  RUE Motor Response Purposeful movement  RUE Sensation Full sensation  LUE Motor Response Purposeful movement  LUE Sensation Full sensation  RLE Motor Response Purposeful movement  RLE Sensation Full sensation  LLE Motor Response Purposeful movement  LLE Sensation Full sensation  Musculoskeletal  Musculoskeletal (WDL) X  Assistive Device Stedy  Generalized Weakness Yes  Weight Bearing Restrictions No  Integumentary  Integumentary (WDL) X  Skin Condition Dry  Skin Turgor Non-tenting

## 2021-12-13 NOTE — Progress Notes (Signed)
Occupational Therapy Session Note  Patient Details  Name: MARJON DOXTATER MRN: [id number] Date of Birth: 01/04/1951  Today's Date: 12/13/2021 OT Individual Time: 0921-1028 OT Individual Time Calculation (min): 67 min  8 mins missed d/t nursing care   Short Term Goals: Week 2:  OT Short Term Goal 1 (Week 2): STG = LTG 2/2 LOS  Skilled Therapeutic Interventions/Progress Updates:  Pt greeted supine in bed with daughter present, pt agreeable to OT intervention. Session focus on BADL reeducation, functional mobility, dynamic standing balance and decreasing overall caregiver burden. Pt completed LB dressing from bed level with MOD A with pt able to roll to pull pants up to waist line and even bridge hips to assist with dressing. Pt completed supine>sit with increased time with CGA. Once EOB pt donned new [place] shirt with MIN A. Pt then returns self to supine, explained to pts daughter that this has been her normal at least with this OTA. Pt needed increased encouragement and time to transition back to sitting. Pt did respond well to gospel music being played. Pt able to stand to stedy with increased time with MIN A +1. Pt transported to recliner with total A in stedy. Pt noted to start looking through health resource notebook, discussed various items in book such as hypertension, and hypoglycemia.  pt left seated in recliner with alarm belt activated and all needs within reach.                    Therapy Documentation Precautions:  Precautions Precautions: Fall, Other (comment) Precaution Comments: cortrak; right leg discomfort - PTA Restrictions Weight Bearing Restrictions: No  Pain: pain in R heel, repositioning provided as needed.     Therapy/Group: Individual Therapy  Corinne Ports [facility] 12/13/2021, 12:11 PM

## 2021-12-13 NOTE — Progress Notes (Signed)
Physical Therapy Session Note  Patient Details  Name: OMARA ALCON MRN: [id number] Date of Birth: [birth year]  Today's Date: 12/13/2021 PT Individual Time: 1304-1400 PT Individual Time Calculation (min): 56 min   Short Term Goals: Week 2:  PT Short Term Goal 1 (Week 2): =LTG due to ELOS  Skilled Therapeutic Interventions/Progress Updates: Pt presented in recliner with daughters present. Pt agreeable to therapy however when asked to perform activities pt with minimal initiation and highly distracted. Pt noted to have confirmed UTI today with dgts aware. Pt required modA to sit up in recliner and was unable to follow command to scoot forward to [place]. Pt required significant encouragement to perform Sit to stand and took >20 min to perform. Pt did require modA x 2 to stand from recliner. Pt unable to follow command to sit in [place] therefore pt remained standing with PTA guarding pt. Pt ultimately transferred to w/c. Pt taken outside with Christophe Louis for change in environment and therapeutic break. Pt transported back to room and performed Stedy transfer to w/c in same manner as prior however pt was able to move in more timely manner this session. Pt left in recliner at end of session with NT present for vitals and current needs met.      Therapy Documentation Precautions:  Precautions Precautions: Fall, Other (comment) Precaution Comments: cortrak; right leg discomfort - PTA Restrictions Weight Bearing Restrictions: No General:   Vital Signs: Therapy Vitals Temp: 97.8 F (36.6 C) Pulse Rate: (!) 106 Resp: 16 BP: 134/69 Patient Position (if appropriate): Sitting Oxygen Therapy SpO2: 100 % O2 Device: Room Air Pain:   Mobility:   Locomotion :    Trunk/Postural Assessment :    Balance:   Exercises:   Other Treatments:      Therapy/Group: Individual Therapy  Norell Brisbin 12/13/2021, 4:36 PM

## 2021-12-13 NOTE — Progress Notes (Signed)
[facility] NEPHROLOGY PROGRESS NOTE  Assessment/ Plan:  #Acute kidney injury secondary due to ischemic ATN in the setting of cardiac arrest, shock and contrast.  She has underlying CKD with baseline creatinine level around 1.3 back in 2021.  She has been RRT dependent since 10/31/2021 which was discussed with the patient and family.  Now receiving HD per MWF schedule.  See already has a spot at [facility] TTS schedule second shift for AKI.   Status post HD yesterday with around 800 cc UF.  Tolerated well.  Plan for next HD tomorrow.  #V-fib cardiac arrest outside the hospital due to massive PE.  #Massive PE status post mechanical thrombectomy on 1/2. Vasc US- no DVT, liver mass noted 10/28/21 CT abd/ pelvis, s/p MRI abd--> incompletely characterized, had MRI with gadolinium 1/20  to reevaluate -- not definitive. S/p hem/onc eval.  # Retroperitoneal bleed/Hemorrhagic shock/Acute blood loss anemia originating from left liver lobe s/p embolization 1/4--> required FFP, plts, Vit K, pRBCs; now stable. Hematology following - no ESA due to concern for her recent thromboembolic disease. Transfuse when needed   # Thrombocytopenia- suspected ITP, s/p Nplate PRN and dex. -  work up and mgmt per heme.  # Acute hypoxic RF- extubated 11/07/21, now much improved  Subjective: Seen and examined.  Noted overnight patient had a fall and plan for CT scan head today.  Patient is confused.  Objective Vital signs in last 24 hours: Vitals:   12/12/21 2250 12/13/21 0043 12/13/21 0409 12/13/21 0521  BP: 133/75 108/67 104/86 124/70  Pulse: (!) 109 (!) 104 (!) 108 (!) 104  Resp: 20 16 18 18   Temp: 98.9 F (37.2 C) 98 F (36.7 C) 98.9 F (37.2 C) 98.6 F (37 C)  TempSrc: Oral  Oral Oral  SpO2: 100% 99% 100% 100%  Weight:    64.3 kg  Height:       Weight change: -0.4 kg  Intake/Output Summary (Last 24 hours) at 12/13/2021 5885 Last data filed at 12/13/2021 0700 Gross per 24 hour  Intake 180 ml  Output  1200 ml  Net -1020 ml        Labs: Basic Metabolic Panel: Recent Labs  Lab 12/09/21 0610 12/10/21 0516 12/12/21 1216  NA 138 139 136  K 4.0 3.9 4.0  CL 102 104 100  CO2 24 24 26   GLUCOSE 425* 260* 172*  BUN 30* 38* 26*  CREATININE 3.56* 3.92* 3.36*  CALCIUM 8.4* 8.8* 9.2  PHOS 3.1 3.0 3.4    Liver Function Tests: Recent Labs  Lab 12/07/21 0525 12/08/21 0527 12/09/21 0610 12/10/21 0516 12/12/21 1216  AST 27  --   --   --   --   ALT 28  --   --   --   --   ALKPHOS 144*  --   --   --   --   BILITOT 1.0  --   --   --   --   PROT 6.5  --   --   --   --   ALBUMIN 2.7*   < > 2.6* 2.9* 2.9*   < > = values in this interval not displayed.    No results for input(s): LIPASE, AMYLASE in the last 168 hours. No results for input(s): AMMONIA in the last 168 hours. CBC: Recent Labs  Lab 12/07/21 0525 12/10/21 0516 12/12/21 1216 12/13/21 0541  WBC 9.1 12.5* 15.1* 14.9*  NEUTROABS 6.6  --   --  10.9*  HGB 8.4*  8.2* 8.4* 8.6*  HCT 25.7* 25.4* 26.3* 27.2*  MCV 88.9 90.1 89.5 89.8  PLT 110* 83* 123* 139*    Cardiac Enzymes: No results for input(s): CKTOTAL, CKMB, CKMBINDEX, TROPONINI in the last 168 hours. CBG: Recent Labs  Lab 12/12/21 0658 12/12/21 1128 12/12/21 1755 12/12/21 2201 12/13/21 0527  GLUCAP 124* 137* 83 315* 387*     Iron Studies: No results for input(s): IRON, TIBC, TRANSFERRIN, FERRITIN in the last 72 hours. Studies/Results: DG Abd Portable 1V  Result Date: 12/12/2021 CLINICAL DATA:  Feeding tube placement EXAM: PORTABLE ABDOMEN - 1 VIEW COMPARISON:  None. FINDINGS: Feeding tube in the mid to distal body of the stomach. Normal bowel gas pattern.  No abnormal calcifications. IMPRESSION: Feeding tube tip in the mid distal body of the stomach. Electronically Signed   By: Franchot Gallo M.D.   On: 12/12/2021 11:49    Medications: Infusions:   Scheduled Medications:  amLODipine  10 mg Oral Q2000   apixaban  5 mg Oral BID   atorvastatin  20  mg Oral Daily   B-complex with vitamin C  1 tablet Oral Daily   chlorhexidine  15 mL Mouth Rinse BID   Chlorhexidine Gluconate Cloth  6 each Topical Q0600   dicyclomine  10 mg Oral TID AC   DULoxetine  20 mg Oral Daily   feeding supplement (NEPRO CARB STEADY)  237 mL Oral TID BM   feeding supplement (OSMOLITE 1.5 CAL)  975 mL Per Tube Q24H   feeding supplement (PROSource TF)  45 mL Per Tube BID   folic acid  2 mg Oral Daily   heparin sodium (porcine)  3,200 Units Intracatheter Once   insulin aspart  0-5 Units Subcutaneous QHS   insulin aspart  0-9 Units Subcutaneous TID WC   insulin detemir  8 Units Subcutaneous BID   lidocaine  2 patch Transdermal Q24H   megestrol  800 mg Oral Daily   mupirocin cream  1 application Topical BID   pantoprazole  40 mg Oral BID   senna  2 tablet Oral Daily    have reviewed scheduled and prn medications.  Physical Exam: General: Confused female lying on bed, she is alert awake  Heart:RRR, s1s2 nl Lungs: Clear b/l, no crackle Abdomen:soft, Non-tender, non-distended Extremities:No edema Dialysis Access: Right IJ TDC  Naomee Nowland Tanna Furry 12/13/2021,9:22 AM  LOS: 9 days

## 2021-12-13 NOTE — Progress Notes (Signed)
Speech Language Pathology Weekly Progress and Session Note  Patient Details  Name: [name] MRN: [id number] Date of Birth: October 05, 1951  Beginning of progress report period: December 05, 2021 End of progress report period: December 12, 2021  Today's Date: 12/13/2021 SLP Individual Time: 1030-1100 SLP Individual Time Calculation (min): 30 min  Short Term Goals: Week 1: SLP Short Term Goal 1 (Week 1): Patient will demonstrate sustained attention to functional tasks for 5 minutes with Mod verbal cues for redirection. SLP Short Term Goal 1 - Progress (Week 1): Not met SLP Short Term Goal 2 (Week 1): Patient will demonstrate functional problem solving for basic and familair tasks with Mod verbal and visual cues. SLP Short Term Goal 2 - Progress (Week 1): Not met SLP Short Term Goal 3 (Week 1): Patient will demonstrate orientation to place, time and situation with Mod verbal and visual cues. SLP Short Term Goal 3 - Progress (Week 1): Not met SLP Short Term Goal 4 (Week 1): Patient will identify 2 cognitive and 2 physical deficits with Mod multimodal cues. SLP Short Term Goal 4 - Progress (Week 1): Not met SLP Short Term Goal 5 (Week 1): Patient will consume current diet with minimal overt s/s of aspiration with supervision level verbal cues for use of swallowing compensatory strategies. SLP Short Term Goal 5 - Progress (Week 1): Not met SLP Short Term Goal 6 (Week 1): Patient will utilize word-finding strategies at the phrase level with Mod verbal cues in 50% of opportunities. SLP Short Term Goal 6 - Progress (Week 1): Met    New Short Term Goals: Week 2: SLP Short Term Goal 1 (Week 2): Patient will demonstrate sustained attention to functional tasks for 5 minutes with Mod verbal cues for redirection. SLP Short Term Goal 2 (Week 2): Patient will initaite functional tasks with Mod A multimodal cues. SLP Short Term Goal 3 (Week 2): Patient will demonstrate orientation to place, time and  situation with Mod verbal and visual cues. SLP Short Term Goal 4 (Week 2): Patient will identify 2 cognitive and 2 physical deficits with Mod multimodal cues. SLP Short Term Goal 5 (Week 2): Patient will consume current diet with minimal overt s/s of aspiration with Min verbal cues for use of swallowing compensatory strategies.  Weekly Progress Updates: Patient has made slow and inconsistent gains and has met 1 of 6 STGs this reporting period. Patient's progress has been impacted by fatigue and increased confusion which physician suspects is due to a UTI. Currently, patient is consuming Dys. 2 textures with thin liquids with minimal overt s/s of aspiration but with prolonged mastication. Patient with minimal PO intake, therefore, NG is required for nutritional support. Patient continues to demonstrate severe cognitive impairments impacting orientation, initiation, sustained attention, functional problem solving, recall of daily information, and overall safety awareness. Patient also with increased verbal expression with improved word-finding at the phrase and sentence level. Patient and family education ongoing. Patient would benefit from continued skilled SLP intervention to maximize her cognitive and swallowing function prior to discharge.      Intensity: Minumum of 1-2 x/day, 30 to 90 minutes Frequency: 3 to 5 out of 7 days Duration/Length of Stay: TBD due to possible SNF placement Treatment/Interventions: Cognitive remediation/compensation;Dysphagia/aspiration precaution training;Internal/external aids;Speech/Language facilitation;Cueing hierarchy;Environmental controls;Therapeutic Activities;Functional tasks;Patient/family education   Daily Session  Skilled Therapeutic Interventions:  Skilled treatment session focused on cognitive and dysphagia goals. Upon arrival, patient was awake and alert while upright in the recliner. Patient's daughter had just brought her  a [employer] steak biscuit for  breakfast. Educated daughter that the biscuit was not on her currently prescribed diet but that we could use it as trials since SLP was present and because patient specifically requested it. Patient with minimal PO intake despite max encouragement with mild oral expectoration of breading from the meat. Recommend patient continue current diet. Patient's daughters present as well as nursing resulting in a moderately distracting environment. SLP provided Max verbal cues for selective attention to self-feeding for 1-2 minute intervals. Patient with decreased recall of events from last night and reported we are telling "lies."  No overt difficulty with word-finding noted at the phrase or sentence level today. Patient left upright in recliner with alarm on and NT present. Continue with current plan of care.     Pain Pain Assessment Pain Scale: 0-10 Pain Score: 0-No pain  Therapy/Group: Individual Therapy  Walther Sanagustin 12/13/2021, 12:36 PM

## 2021-12-13 NOTE — Progress Notes (Signed)
PROGRESS NOTE   Subjective/Complaints:  Pt was extremely confused and combative last night- swatting at nursing and fell on floor- pt refused mask to go to CT and daughter allowed to wait til this AM for head CT.   Pt's WBC still 14.9- will get U/A and Cx that was ordered- was in HD when ordered yesterday, so will make sure gets done asap- called charge nurse about it.  Also pt attempted to call 911 last night, in midst of being confused.   Unable to do PVRs- pt was being combative based on documentation.  HR low 100's however no fever.  LBM yesterday  ROS: Limited by cognition/sedation   Objective:   DG Abd Portable 1V  Result Date: 12/12/2021 CLINICAL DATA:  Feeding tube placement EXAM: PORTABLE ABDOMEN - 1 VIEW COMPARISON:  None. FINDINGS: Feeding tube in the mid to distal body of the stomach. Normal bowel gas pattern.  No abnormal calcifications. IMPRESSION: Feeding tube tip in the mid distal body of the stomach. Electronically Signed   By: Franchot Gallo M.D.   On: 12/12/2021 11:49   Recent Labs    12/12/21 1216 12/13/21 0541  WBC 15.1* 14.9*  HGB 8.4* 8.6*  HCT 26.3* 27.2*  PLT 123* 139*   Recent Labs    12/12/21 1216  NA 136  K 4.0  CL 100  CO2 26  GLUCOSE 172*  BUN 26*  CREATININE 3.36*  CALCIUM 9.2    Intake/Output Summary (Last 24 hours) at 12/13/2021 1610 Last data filed at 12/13/2021 0700 Gross per 24 hour  Intake 180 ml  Output 1200 ml  Net -1020 ml     Pressure Injury 12/04/21 Head Posterior Unstageable - Full thickness tissue loss in which the base of the injury is covered by slough (yellow, tan, gray, green or brown) and/or eschar (tan, brown or black) in the wound bed. 3 cm round, scabbed area to po (Active)  12/04/21 2000  Location: Head  Location Orientation: Posterior  Staging: Unstageable - Full thickness tissue loss in which the base of the injury is covered by slough (yellow, tan,  gray, green or brown) and/or eschar (tan, brown or black) in the wound bed.  Wound Description (Comments): 3 cm round, scabbed area to posterior head.  Present on Admission: Yes    Physical Exam: Vital Signs Blood pressure 124/70, pulse (!) 104, temperature 98.6 F (37 C), temperature source Oral, resp. rate 18, height 5\' 5"  (1.651 m), weight 64.3 kg, SpO2 100 %.      General: awake, alert, remembers me; not my role this AM; sitting up in bed; looks comfortable; , NAD HENT: conjugate gaze; oropharynx moist- cortrak in place  CV: mildly tachycardic rate; no JVD Pulmonary: CTA B/L; no W/R/R- good air movement- sounds great GI: soft, NT, ND, (+)BS-  Psychiatric: aplesatnyl confused Neurological: Ox1- pleasantly confused/tangential Skin: unstageable wound with scabbing on back of head 3-4cm in diameter- also has popped/dry blister on L sternum- slough gone from base- looks better- both look the same today Genitourinary:    Comments: Foley tube in place- medium clear, yellow urine in bag- no change Musculoskeletal:     Cervical back: Neck supple.  No tenderness.     Comments: UE 5-/5 B/L  LE 3/5 HF/KE- DF 4-/5 on L and 2-/5 on R; PF 4-/5 B/L  R foot drop- at rest in PF   Skin:    General: Skin is warm and dry. Wound on occiput/matted with hair    Comments: R chest HD catheter  Neuro: Mild tremors with movement. Reasonable sitting balance Numb/absent/decreased sensation from mid calf to toes B/L L>R Vague- Ox2 with cues   Assessment/Plan: 1. Functional deficits which require 3+ hours per day of interdisciplinary therapy in a comprehensive inpatient rehab setting. Physiatrist is providing close team supervision and 24 hour management of active medical problems listed below. Physiatrist and rehab team continue to assess barriers to discharge/monitor patient progress toward functional and medical goals  Care Tool:  Bathing    Body parts bathed by patient: Right arm, Left arm,  Chest, Right upper leg, Left upper leg, Face, Front perineal area, Abdomen, Buttocks   Body parts bathed by helper: Front perineal area, Buttocks, Right lower leg, Left lower leg     Bathing assist Assist Level: Minimal Assistance - Patient > 75%     Upper Body Dressing/Undressing Upper body dressing   What is the patient wearing?: Pull over shirt    Upper body assist Assist Level: Minimal Assistance - Patient > 75%    Lower Body Dressing/Undressing Lower body dressing      What is the patient wearing?: Pants     Lower body assist Assist for lower body dressing: Maximal Assistance - Patient 25 - 49%     Toileting Toileting Toileting Activity did not occur (Clothing management and hygiene only): N/A (no void or bm)  Toileting assist Assist for toileting: Maximal Assistance - Patient 25 - 49%     Transfers Chair/bed transfer  Transfers assist     Chair/bed transfer assist level: 2 Helpers (MIN A +2 stand to stedy)     Locomotion Ambulation   Ambulation assist   Ambulation activity did not occur: Safety/medical concerns          Walk 10 feet activity   Assist  Walk 10 feet activity did not occur: Safety/medical concerns        Walk 50 feet activity   Assist Walk 50 feet with 2 turns activity did not occur: Safety/medical concerns         Walk 150 feet activity   Assist Walk 150 feet activity did not occur: Safety/medical concerns         Walk 10 feet on uneven surface  activity   Assist Walk 10 feet on uneven surfaces activity did not occur: Safety/medical concerns         Wheelchair     Assist Is the patient using a wheelchair?: Yes Type of Wheelchair: Manual    Wheelchair assist level: Minimal Assistance - Patient > 75% Max wheelchair distance: 100'    Wheelchair 50 feet with 2 turns activity    Assist        Assist Level: Minimal Assistance - Patient > 75%   Wheelchair 150 feet activity     Assist       Assist Level: Dependent - Patient 0%   Blood pressure 124/70, pulse (!) 104, temperature 98.6 F (37 C), temperature source Oral, resp. rate 18, height 5\' 5"  (1.651 m), weight 64.3 kg, SpO2 100 %.  Medical Problem List and Plan: 1. Functional deficits secondary to debility related to VF cardiac arrest secondary to massive pulmonary emboli  status post mechanical thrombectomy             -patient may not shower due to HD catheter- unless it's removed             -ELOS/Goals: 10-14 days supervision to min A for PT/OT SLP  -con't CIR- Continue CIR- PT, OT and SLP  Continue CIR- PT, OT and SLP Family conference today at 11am- to discuss pt's care- and dispo 2.  Antithrombotics: -DVT/anticoagulation:  Pharmaceutical: Other (comment) Eliquis             -antiplatelet therapy: N/A 3. Pain Management: Neurontin 100 mg twice daily, oxycodone as needed  2/9- had been on gabapentin for nerve pain-will stop due to sedation- will start RLE lidoderm patch 2 patches for pain.   2/13  more alert off gabapentin in spite of being on at home prior for neuropathy 4. Mood anxiety-  Cymbalta 20 mg daily, Xanax 0.25 mg daily as needed provide emotional support             -antipsychotic agents: N/A 5. Neuropsych: This patient is? capable of making decisions on her own behalf. 6. Skin/Wound Care: Routine skin checks 7. Fluids/Electrolytes/Nutrition: still not eating much  -continue TF for now.   -hopefully po intake will pick up as mentation improves.  8.  Retroperitoneal bleed/hemorrhagic shock/acute blood loss anemia/status post left liver lobe embolization  2/9- will check LFTs tomorrow to make sure liver function doing OK?  2/11- LFTs look great- con't to monitor every so often 9.  AKI secondary to ATN.  Follow-up per renal services.  Started clip for AKI- not ESRD, however Cr almost 5 today- getting HD tomorrow 2/8.   2/9- last Cr 5.74- up from 4.97 up from 4.45- rising- per renal- likely HD  tomorrow?.   2/16- Cr 3.36 and BUN 26- per renal  10.  Thrombocytopenia.  Suspect ITP.  Follow-up per hematology  2/13- plts 83k. Continue to monitor (has ranged between 60k and 110k)  2/16- Plts up to 139k 11.  Hyperlipidemia.  Lipitor 12. Diabetes- Poor appetite- has Cortrak for intake due to poor appetite-stop insulin- was on at home- levemir 4 units BID when came to rehab, but dropping CBGs- so will hold- will do CBGs BID Will assess if still needs- d/c insulin due to Tfs  2/11- TF's restarted so CBGs going up- 486 this AM- restarted Levemir 4 units BID and SSI- will titrate as required.   2/12- got called about Bgs in 400s- again this AM- due to TF's- will increase Levemir to 8 units BID and monitor-   2/13 levemir just increased--observe today  2/14- will consult DM coordiantor to help- d/c'd boost breeze- too many carbs-   2/15- CBGs 77-177- with Novolog 2 units q4 hours while on TF's  2/16- CBGs 83-387- was doing better, however spiked again last night- just made change yesterday- give 1 day-  13. New onset confusion/poor memory- anoxia? From prolonged CPR  2/9- will check CT of head- and stop Gabapentin for nerve pain due to increased sedation- had before but so out of it per daughter- much worse last 2 days.   2/15- variable -tangential and  poor memory- will need to d/w daughter 28. ABLA with chronic anemia  2/13 hb stable at 8.2 15. Scab on back of head-pressure ulcer unstageable/Blister on sternum   2/9- on the back of her head  2/10- will need to have hair washed to see if we can see wound that's on  CT  2/13 WOC RN examined wound today --peroxide and mupirocin bid with dry dressing  2/15- touched base with nursing- stable- unstageable on back of head- blister healing 16. HTN with hx of VF arrest  2/10- pt's BP 178/95- running 314H/702O- systolic- will d/w renal since not on any BP meds-   2/11- Started norvasc and BP looks better 378 systolic- will con't to monitor trend.    2/13 bp borderline but improved from baseline--continue curent meds  2/14- BP borderline, - con't regimen 17. Constipation  2/14- will give sorbitol- 30 cc after therapy- 2pm  2/15- KUB shows normal bowel gas pattern- no constipation  2/16- LBM yesterday 18. Leukocytosis  2/15- will remove foley and bladder scan; also check U/A and Cx- foley was per renal .   2/16- advised nursing to check U/A and Cx- will likely need ABX- will also check portable CXR since pt refuses to go downstairs for head CT 19. Fall  2/16- pt on floor last night- denied pain- CT head was ordered - pt refused to keep on mask to go downstairs- will get this AM.    I spent a total of   55  minutes on total care today- >50% coordination of care- due to  family conference, as well as d/w nursing about confusion and reviewing all new notes- and d/w charge nurse about U/A and Cx and d/w pharmacy.        LOS: 9 days A FACE TO FACE EVALUATION WAS PERFORMED  Arlin Savona 12/13/2021, 8:53 AM

## 2021-12-13 NOTE — Progress Notes (Signed)
Occupational Therapy Session Note  Patient Details  Name: [name] MRN: [id number] Date of Birth: May 14, 1951  Today's Date: 12/13/2021 OT Individual Time: 1130-1157 OT Individual Time Calculation (min): 27 min    Short Term Goals: Week 2:  OT Short Term Goal 1 (Week 2): STG = LTG 2/2 LOS  Skilled Therapeutic Interventions/Progress Updates:    Pt resting in recliner upon arrival and greeted OTA appropriately. Pt agreeable to participating in therapy with encouragement. Sit<>stand in [place] with mod A. Standing balance min A with BUE support. Pt able to reach for bean bag and toss to daughter wirh RUE. Pt required rest breaks X 3 on Stedy paddles. Pt returned to recliiner. Belt alarm activated. All needs within reach and daughters present.   Therapy Documentation Precautions:  Precautions Precautions: Fall, Other (comment) Precaution Comments: cortrak; right leg discomfort - PTA Restrictions Weight Bearing Restrictions: No  Pain: Pain Assessment Pain Scale: 0-10 Pain Score: 0-No pain  Therapy/Group: Individual Therapy  Leroy Libman 12/13/2021, 12:05 PM

## 2021-12-14 ENCOUNTER — Inpatient Hospital Stay (HOSPITAL_COMMUNITY): Payer: Medicare PPO

## 2021-12-14 LAB — RENAL FUNCTION PANEL
Albumin: 3.1 g/dL — ABNORMAL LOW (ref 3.5–5.0)
Anion gap: 11 (ref 5–15)
BUN: 32 mg/dL — ABNORMAL HIGH (ref 8–23)
CO2: 24 mmol/L (ref 22–32)
Calcium: 9.1 mg/dL (ref 8.9–10.3)
Chloride: 94 mmol/L — ABNORMAL LOW (ref 98–111)
Creatinine, Ser: 3.3 mg/dL — ABNORMAL HIGH (ref 0.44–1.00)
GFR, Estimated: 14 mL/min — ABNORMAL LOW (ref >60)
Glucose, Bld: 594 mg/dL (ref 70–99)
Phosphorus: 2.8 mg/dL (ref 2.5–4.6)
Potassium: 4.6 mmol/L (ref 3.5–5.1)
Sodium: 129 mmol/L — ABNORMAL LOW (ref 135–145)

## 2021-12-14 LAB — CBC WITH DIFFERENTIAL/PLATELET
Abs Immature Granulocytes: 0.37 10*3/uL — ABNORMAL HIGH (ref 0.00–0.07)
Basophils Absolute: 0.1 10*3/uL (ref 0.0–0.1)
Basophils Relative: 0 %
Eosinophils Absolute: 0 10*3/uL (ref 0.0–0.5)
Eosinophils Relative: 0 %
HCT: 29 % — ABNORMAL LOW (ref 36.0–46.0)
Hemoglobin: 9.4 g/dL — ABNORMAL LOW (ref 12.0–15.0)
Immature Granulocytes: 2 %
Lymphocytes Relative: 5 %
Lymphs Abs: 0.9 10*3/uL (ref 0.7–4.0)
MCH: 28.7 pg (ref 26.0–34.0)
MCHC: 32.4 g/dL (ref 30.0–36.0)
MCV: 88.7 fL (ref 80.0–100.0)
Monocytes Absolute: 1.3 10*3/uL — ABNORMAL HIGH (ref 0.1–1.0)
Monocytes Relative: 8 %
Neutro Abs: 14.5 10*3/uL — ABNORMAL HIGH (ref 1.7–7.7)
Neutrophils Relative %: 85 %
Platelets: 169 10*3/uL (ref 150–400)
RBC: 3.27 MIL/uL — ABNORMAL LOW (ref 3.87–5.11)
RDW: 15.3 % (ref 11.5–15.5)
WBC: 17.1 10*3/uL — ABNORMAL HIGH (ref 4.0–10.5)
nRBC: 0 % (ref 0.0–0.2)

## 2021-12-14 LAB — GLUCOSE, CAPILLARY
Glucose-Capillary: 509 mg/dL (ref 70–99)
Glucose-Capillary: 476 mg/dL — ABNORMAL HIGH (ref 70–99)
Glucose-Capillary: 529 mg/dL (ref 70–99)
Glucose-Capillary: 397 mg/dL — ABNORMAL HIGH (ref 70–99)
Glucose-Capillary: 314 mg/dL — ABNORMAL HIGH (ref 70–99)
Glucose-Capillary: 214 mg/dL — ABNORMAL HIGH (ref 70–99)
Glucose-Capillary: 103 mg/dL — ABNORMAL HIGH (ref 70–99)
Glucose-Capillary: 358 mg/dL — ABNORMAL HIGH (ref 70–99)

## 2021-12-14 MED ORDER — INSULIN ASPART 100 UNIT/ML IJ SOLN
3.0000 [IU] | Freq: Once | INTRAMUSCULAR | Status: AC
  Administered 2021-12-14: 3 [IU] via SUBCUTANEOUS

## 2021-12-14 MED ORDER — ALPRAZOLAM 0.25 MG PO TABS
0.2500 mg | ORAL_TABLET | Freq: Every day | ORAL | Status: DC
Start: 2021-12-14 — End: 2021-12-14

## 2021-12-14 MED ORDER — ALPRAZOLAM 0.25 MG PO TABS
0.2500 mg | ORAL_TABLET | Freq: Every day | ORAL | Status: DC
Start: 2021-12-15 — End: 2022-01-07
  Administered 2021-12-15 – 2022-01-07 (×24): 0.25 mg via ORAL
  Filled 2021-12-14 (×25): qty 1

## 2021-12-14 NOTE — Progress Notes (Signed)
Physical Therapy Session Note  Patient Details  Name: [name] MRN: [id number] Date of Birth: [birth year]  Today's Date: 12/14/2021 PT Individual Time: 1000-1041 PT Individual Time Calculation (min): 41 min  Today's Date: 12/14/2021 PT Missed Time: 19 Minutes Missed Time Reason: Patient fatigue  Short Term Goals: Week 1:  PT Short Term Goal 1 (Week 1): Pt will initiate standing with RW PT Short Term Goal 1 - Progress (Week 1): Not met PT Short Term Goal 2 (Week 1): Pt will tolerate sitting OOB between sessions PT Short Term Goal 2 - Progress (Week 1): Met PT Short Term Goal 3 (Week 1): Pt will tolerate x 5 min therapeutic exercise PT Short Term Goal 3 - Progress (Week 1): Met Week 2:  PT Short Term Goal 1 (Week 2): =LTG due to ELOS  Skilled Therapeutic Interventions/Progress Updates:   Received pt slouched in bed leaning to the L with eyes closed. Pt seemed somewhat agreeable to working with this therapist and appeared to have discomfort/tenderness in heels R>L during session but unable to verbally communicate with therapist. Session with emphasis on functional mobility, attention and initiation, standing tolerance, and endurance. Attempted to have pt put socks and shoes on, however pt showed no initiation therefore donned with total A. RN present to check blood glucose levels (VERY high >450) and then PA, Dan, arrived to check on pt. Per RN, pt has been declining over the past few days (with new UTI) and had a rough night last night. Pt transferred semi-reclined<>sitting EOB with mod HHA with HOB elevated and increased time as pt not initating any movement. Upon sitting EOB pt was finally able to scoot to EOB when given something to reach out in front of her for. Pt transferred sit<>stand x2 trials with mod A +2 - pt with heavy posterior lean and unable to follow commands to correct; returned to sitting. Sit<>stand x 2 additional trials holding onto WC placed in front of pt with min A +2 -  noted significant improvements in posterior lean. Pt only able to remain standing ~30 seconds at a time while NT changed bed linens, although at one point pt did unintentionally step R foot forward. Due to drowsiness and high blood glucose levels pt currently unsafe to transfer to [facility]. Sit<>supine with mod A +2 and scooted to [facility] with +2 assist. Rolled L/R with min A +2 to reposition chuck pad. Planned to work on bed level strengthening exercises, however pt unable to sustain arousal long enough to participate. Concluded session with pt semi-reclined in bed, needs within reach, and bed alarm on. 19 minutes missed of skilled physical therapy due to fatigue.   Therapy Documentation Precautions:  Precautions Precautions: Fall, Other (comment) Precaution Comments: cortrak; right leg discomfort - PTA Restrictions Weight Bearing Restrictions: No  Therapy/Group: Individual Therapy Alfonse Alpers PT, DPT   12/14/2021, 7:10 AM

## 2021-12-14 NOTE — Progress Notes (Signed)
Received a call from the Bedside RN stating that the family was concerned about a red spot (that was un-noticed) on pt's cheek. Explained that she had thrashed about the entire treatment and was found numerous times sleeping upon her feeding tube and also with her Dialysis tubing noted about her head and across her face.  That although we had moved her tubing each and every time she was consistantly found laying atop them.  Bedside RN asked if I would speak with family, to which I agreed.   Pt's daughter was placed on the phone and she immediately verbally attacked and spoke in a very threatening manner.  I attempted to explain how she laid upon her tubing throughout treatment, however was unable give any further explanations because the daughter continued to verbally abuse me and continued to raise her voice.

## 2021-12-14 NOTE — Progress Notes (Signed)
[facility] NEPHROLOGY PROGRESS NOTE  Assessment/ Plan:  #Acute kidney injury secondary due to ischemic ATN in the setting of cardiac arrest, shock and contrast.  She has underlying CKD with baseline creatinine level around 1.3 back in 2021.  She has been RRT dependent since 10/31/2021 which was discussed with the patient and family.  Now receiving HD per MWF schedule.  See already has a spot at [facility] TTS schedule second shift for AKI.   Continue dialysis on MWF schedule  #V-fib cardiac arrest outside the hospital due to massive PE.  #Massive PE status post mechanical thrombectomy on 1/2. Vasc US- no DVT, liver mass noted 10/28/21 CT abd/ pelvis, s/p MRI abd--> incompletely characterized, had MRI with gadolinium 1/20  to reevaluate -- not definitive. S/p hem/onc eval.  # Retroperitoneal bleed/Hemorrhagic shock/Acute blood loss anemia originating from left liver lobe s/p embolization 1/4--> required FFP, plts, Vit K, pRBCs; now stable. Hematology following - no ESA due to concern for her recent thromboembolic disease. Transfuse when needed   # Thrombocytopenia- suspected ITP, s/p Nplate PRN and dex. -  work up and mgmt per heme.  Overall much improved  # Acute hypoxic RF- extubated 11/07/21, now much improved  #Hyperglycemia: Insulin management per primary team  #Encephalopathy: Likely multifactorial with delirium being the main contributor.  Management per primary team  #Leukocytosis: On antibiotics with cefepime.  Management per primary team  Subjective: Patient resting this morning.  More agitated overnight.  Bladder scan with 400 overnight.  Hyperglycemia  Objective Vital signs in last 24 hours: Vitals:   12/13/21 1407 12/13/21 1957 12/14/21 0500 12/14/21 0555  BP: 134/69 135/64  (!) 162/72  Pulse: (!) 106 (!) 110  (!) 104  Resp: 16 18  20   Temp: 97.8 F (36.6 C) (!) 97.5 F (36.4 C)  98.9 F (37.2 C)  TempSrc:  Oral  Oral  SpO2: 100% 99%  96%  Weight:   64.9 kg    Height:       Weight change: -1.235 kg  Intake/Output Summary (Last 24 hours) at 12/14/2021 1018 Last data filed at 12/14/2021 0009 Gross per 24 hour  Intake 403.75 ml  Output --  Net 403.75 ml       Labs: Basic Metabolic Panel: Recent Labs  Lab 12/10/21 0516 12/12/21 1216 12/14/21 0628  NA 139 136 129*  K 3.9 4.0 4.6  CL 104 100 94*  CO2 24 26 24   GLUCOSE 260* 172* 594*  BUN 38* 26* 32*  CREATININE 3.92* 3.36* 3.30*  CALCIUM 8.8* 9.2 9.1  PHOS 3.0 3.4 2.8   Liver Function Tests: Recent Labs  Lab 12/10/21 0516 12/12/21 1216 12/14/21 0628  ALBUMIN 2.9* 2.9* 3.1*   No results for input(s): LIPASE, AMYLASE in the last 168 hours. No results for input(s): AMMONIA in the last 168 hours. CBC: Recent Labs  Lab 12/10/21 0516 12/12/21 1216 12/13/21 0541 12/14/21 0628  WBC 12.5* 15.1* 14.9* 17.1*  NEUTROABS  --   --  10.9* 14.5*  HGB 8.2* 8.4* 8.6* 9.4*  HCT 25.4* 26.3* 27.2* 29.0*  MCV 90.1 89.5 89.8 88.7  PLT 83* 123* 139* 169   Cardiac Enzymes: No results for input(s): CKTOTAL, CKMB, CKMBINDEX, TROPONINI in the last 168 hours. CBG: Recent Labs  Lab 12/13/21 1131 12/13/21 1612 12/14/21 0554 12/14/21 1000 12/14/21 1013  GLUCAP 156* 142* 509* 529* 476*    Iron Studies: No results for input(s): IRON, TIBC, TRANSFERRIN, FERRITIN in the last 72 hours. Studies/Results: DG Abd Portable  1V  Result Date: 12/12/2021 CLINICAL DATA:  Feeding tube placement EXAM: PORTABLE ABDOMEN - 1 VIEW COMPARISON:  None. FINDINGS: Feeding tube in the mid to distal body of the stomach. Normal bowel gas pattern.  No abnormal calcifications. IMPRESSION: Feeding tube tip in the mid distal body of the stomach. Electronically Signed   By: Franchot Gallo M.D.   On: 12/12/2021 11:49    Medications: Infusions:  sodium chloride Stopped (12/13/21 2249)   ceFEPime (MAXIPIME) IV Stopped (12/13/21 1628)    Scheduled Medications:  [START ON 12/15/2021] ALPRAZolam  0.25 mg Oral Daily    amLODipine  10 mg Oral Q2000   apixaban  5 mg Oral BID   atorvastatin  20 mg Oral Daily   B-complex with vitamin C  1 tablet Oral Daily   chlorhexidine  15 mL Mouth Rinse BID   Chlorhexidine Gluconate Cloth  6 each Topical Q0600   dicyclomine  10 mg Oral TID AC   DULoxetine  20 mg Oral Daily   feeding supplement (NEPRO CARB STEADY)  237 mL Oral TID BM   feeding supplement (OSMOLITE 1.5 CAL)  975 mL Per Tube Q24H   feeding supplement (PROSource TF)  45 mL Per Tube BID   folic acid  2 mg Oral Daily   heparin sodium (porcine)  3,200 Units Intracatheter Once   insulin aspart  0-5 Units Subcutaneous QHS   insulin aspart  0-9 Units Subcutaneous TID WC   insulin detemir  8 Units Subcutaneous BID   lidocaine  2 patch Transdermal Q24H   megestrol  800 mg Oral Daily   mupirocin cream  1 application Topical BID   pantoprazole  40 mg Oral BID   senna  2 tablet Oral Daily    have reviewed scheduled and prn medications.  Physical Exam: General: Lying in bed, no distress Heart: Tachycardia Lungs: Bilateral chest rise with no increased work of breathing Abdomen:soft, Non-tender, non-distended Extremities:No edema Dialysis Access: Right IJ TDC  Lorren Splawn J Vayla Wilhelmi 12/14/2021,10:18 AM  LOS: 10 days

## 2021-12-14 NOTE — Progress Notes (Addendum)
Was called to the patients room by the charge nurse. Patient's daughter was here when patient came back from dialysis & noticed a red area to the right cheek. Dialysis was called & the charge nurse was present for the conversation between the family member & the dialysis nurse who stated that the patient could have been laying on her tubing.  As per the charge nurse, patient was reported to be restless in dialysis. Called on call to inform of area. Awaiting a call back. While speaking to the family, they expressed concern that the patient has had a steady decline since admission to our unit. She was noted to be more confused, less active, restless especially at night. She is being treated for a UTI, an episode of hyperglycemia, and had a fall recently. She also has a wound to the back of her head that was thought to be a gash, but she was seen by [facility]  & it was classified as a pressure injury. This nurse did see it this week & it looked like it was covered in eschar. No dried blood noted & treatment was rendered as ordered. This was discussed with the daughter. The other daughter came & was asking about a CT scan that was done today. She was able to pull it up on my chart. She was concerned about the results & was encouraged to speak to the physician in the morning to see what the next steps were. Patient was noted in bed, leaning to the left side, responsive but very tired. Her nurse was informed of occurrences & left in her care.

## 2021-12-14 NOTE — Progress Notes (Signed)
Patient CBG 509, Dan PA notified, no new orders.   Patient saturated brief and bed overnight, also stated she needed to urinated.  transferred to [facility] and urinated. PVR 64ml

## 2021-12-14 NOTE — Progress Notes (Signed)
Occupational Therapy Session Note  Patient Details  Name: [name] MRN: [id number] Date of Birth: [birth year]  Today's Date: 12/14/2021 OT Individual Time: 1100-1130 OT Individual Time Calculation (min): 30 min  and Today's Date: 12/14/2021 OT Missed Time: 30 Minutes Missed Time Reason: Patient fatigue   Short Term Goals: Week 2:  OT Short Term Goal 1 (Week 2): STG = LTG 2/2 LOS  Skilled Therapeutic Interventions/Progress Updates:    Pt sleeping in bed upon arrival and easily aroused. Attempted to engage pt and sit EOB. Bed mobility with mod A for supine>sit. Pt required min/mod A for sitting balance this morning. Pt unable to keep her eyes open and initiate response to commands. Pt returned to supine with max A. Attempted to engage pt in conversataion with HOB elevated but pt, again, unable to keep eyes open and engage. Pt remained in bed with bed alarm activated. All needs within reach. Pt missed 30 mins skilled OT services. Today is HD day so will not be able to check back later.   Therapy Documentation Precautions:  Precautions Precautions: Fall, Other (comment) Precaution Comments: cortrak; right leg discomfort - PTA Restrictions Weight Bearing Restrictions: No General: General OT Amount of Missed Time: 30 Minutes PT Missed Treatment Reason: Patient fatigue  Pain: Pain Assessment Pain Scale: 0-10 Pain Score: 0-No pain   Therapy/Group: Individual Therapy  Leroy Libman 12/14/2021, 11:43 AM

## 2021-12-14 NOTE — Progress Notes (Signed)
Speech Language Pathology Daily Session Note  Patient Details  Name: [name] MRN: [id number] Date of Birth: 1951/09/04  Today's Date: 12/14/2021 SLP Individual Time: 0815-0910 SLP Individual Time Calculation (min): 55 min  Short Term Goals: Week 2: SLP Short Term Goal 1 (Week 2): Patient will demonstrate sustained attention to functional tasks for 5 minutes with Mod verbal cues for redirection. SLP Short Term Goal 2 (Week 2): Patient will initaite functional tasks with Mod A multimodal cues. SLP Short Term Goal 3 (Week 2): Patient will demonstrate orientation to place, time and situation with Mod verbal and visual cues. SLP Short Term Goal 4 (Week 2): Patient will identify 2 cognitive and 2 physical deficits with Mod multimodal cues. SLP Short Term Goal 5 (Week 2): Patient will consume current diet with minimal overt s/s of aspiration with Min verbal cues for use of swallowing compensatory strategies.  Skilled Therapeutic Interventions: Skilled treatment session focused on dysphagia and cognitive goals. Upon arrival, patient was lethargic while upright in bed. Patient's daughter present throughout and provided encouragement. Patient with minimal verbal output and kept her eyes closed the majority of the session. Due to fatigue and decreased arousal, patient had difficulty taking her medications whole with thin, therefore, it has been changed to crushed in puree. However, patient continued to require extra time and max encouragement to consume medications. SLP also provided total A for feeding to maximize PO intake due to decreased attention and initiation. Patient consumed ~25% of a magic cup. Patient left upright in bed with alarm on and family present. Continue with current plan of care.      Pain Pain Assessment Pain Scale: 0-10 Pain Score: 0-No pain  Therapy/Group: Individual Therapy  Khadija Thier 12/14/2021, 12:34 PM

## 2021-12-14 NOTE — NC FL2 (Signed)
[place] MEDICAID FL2 LEVEL OF CARE SCREENING TOOL     IDENTIFICATION  Patient Name: [name] Birthdate: 1951-07-28 Sex: female Admission Date (Current Location): 12/04/2021  [facility] and [place] Number:  Herbalist and Address:  [facility], [place] [address] [place], [place] [address]      Provider Number: [id number]  Attending Physician Name and Address:  Lovorn, Jinny Blossom, MD  Relative Name and Phone Number:  dtr [name] Deveney [phone]    Current Level of Care: Hospital Recommended Level of Care: [place] Prior Approval Number:    Date Approved/Denied:   PASRR Number: [id number]  Discharge Plan: SNF    Current Diagnoses: Patient Active Problem List   Diagnosis Date Noted   Debility 12/04/2021   Anoxia of brain ([place]) 12/04/2021   Acute on chronic renal failure ([place]) 11/12/2021   Leukocytosis    Thrombocytopenia ([place])    Aspiration pneumonia of both lower lobes ([place])    Hemodialysis-associated hypotension    Acute respiratory failure ([place]) due to recurrent aspiration Pneumonia and mucous plugging/atelectasis     Acute saddle pulmonary embolism with acute cor pulmonale ([place])    Protein-calorie malnutrition, severe 11/08/2021   Cardiac arrest ([place]) 10/28/2021   Adjustment disorder with depressed mood 04/10/2021   History of infectious disease 04/10/2021   Affective psychosis ([place]) 11/21/2020   Allergic rhinitis due to pollen 11/21/2020   Anxiety 11/21/2020   Chronic kidney disease, stage 2 (mild) 11/21/2020   Diabetic renal disease ([place]) 11/21/2020   Diverticular disease of colon 11/21/2020   Eczema 11/21/2020   Gastroesophageal reflux disease 11/21/2020   Gout 11/21/2020   Joint pain 11/21/2020   Lymphedema 11/21/2020   Mild intermittent asthma 11/21/2020   Moderate major depression, single episode ([place]) 11/21/2020   Neuropathy 11/21/2020   Type 2 diabetes mellitus with other specified complication  ([place]) 29/93/7169   Pure hypercholesterolemia 11/21/2020   Vitamin D deficiency 11/21/2020   Fibromyalgia 09/04/2017   Insomnia 09/04/2017   DDD (degenerative disc disease), cervical 09/04/2017   DDD (degenerative disc disease), lumbar 09/04/2017   Primary osteoarthritis of both hands 09/04/2017   Primary osteoarthritis of both knees 09/04/2017   History of rotator cuff tear 09/04/2017   Osteopenia of multiple sites 09/04/2017   History of diabetes mellitus 09/04/2017   History of hypertension 09/04/2017   History of hypercholesterolemia 09/04/2017    Orientation RESPIRATION BLADDER Height & Weight     Self, Place, Situation, Time  Normal Incontinent Weight: 143 lb (64.9 kg) Height:  5\' 5"  (165.1 cm)  BEHAVIORAL SYMPTOMS/MOOD NEUROLOGICAL BOWEL NUTRITION STATUS      Incontinent Diet, Feeding tube (D2 thin; likely to get a PEG as has Cortrak. Nocturnal feeds due to poor PO intake. REquires supervision to ensure she is eating.)  AMBULATORY STATUS COMMUNICATION OF NEEDS Skin   Limited Assist Verbally  (Sternum has foam dressing due to ruptured blister; head as a wound requires daily hair washing to soften location as hair is matted around it.)                       Personal Care Assistance Level of Assistance  Bathing, Dressing, Feeding Bathing Assistance: Limited assistance Feeding assistance: Limited assistance Dressing Assistance: Limited assistance     Functional Limitations Info  Sight, Speech, Hearing Sight Info: Adequate Hearing Info: Adequate Speech Info: Adequate    SPECIAL CARE FACTORS FREQUENCY  PT (By licensed PT), OT (By licensed OT), Speech  therapy     PT Frequency: 5xs per week OT Frequency: 5xs per week     Speech Therapy Frequency: 5xs per week      Contractures Contractures Info: Not present    Additional Factors Info  Code Status, Allergies Code Status Info: DNR Allergies Info: Aspirin- Nausea And Vomiting;  Benadryl (diphenhydramine)-Can  only take dye free. States the dye causes itching; Darvon (propoxyphene)- Itching Can only during the day. Night time makes her itch; Hydrocodone Bit-homatrop Mbr-Itching;   Metformin-GI; Other- States reaction was to nasal spray that caused pupils to shrink;  Pentazocine- nervous/jitters;  Percocet (oxycodone-acetaminophen)- itching;  Sulfa Antibiotics-  Hives;   Tetracaine Hcl-Thinks caused itching;  Tetracyclines & Related-  Hives;   Tramadol- itch           Current Medications (12/14/2021):  This is the current hospital active medication list Current Facility-Administered Medications  Medication Dose Route Frequency Provider Last Rate Last Admin   0.9 %  sodium chloride infusion   Intravenous PRN Lovorn, Jinny Blossom, MD   Stopped at 12/13/21 2249   acetaminophen (TYLENOL) tablet 650 mg  650 mg Oral Q4H PRN Cathlyn Parsons, PA-C   650 mg at 12/12/21 1105   Or   acetaminophen (TYLENOL) 160 MG/5ML solution 650 mg  650 mg Per Tube Q4H PRN Angiulli, Lavon Paganini, PA-C       Or   acetaminophen (TYLENOL) suppository 650 mg  650 mg Rectal Q4H PRN Angiulli, Lavon Paganini, PA-C       ALPRAZolam Duanne Moron) tablet 0.25 mg  0.25 mg Oral Daily PRN Cathlyn Parsons, PA-C   0.25 mg at 12/14/21 4174   [START ON 12/15/2021] ALPRAZolam (XANAX) tablet 0.25 mg  0.25 mg Oral Daily Lovorn, Megan, MD       amLODipine (NORVASC) tablet 10 mg  10 mg Oral Q2000 Pham, Minh Q, RPH-CPP   10 mg at 12/13/21 2114   apixaban (ELIQUIS) tablet 5 mg  5 mg Oral BID AngiulliLavon Paganini, PA-C   5 mg at 12/14/21 0814   atorvastatin (LIPITOR) tablet 20 mg  20 mg Oral Daily Cathlyn Parsons, PA-C   20 mg at 12/14/21 4818   B-complex with vitamin C tablet 1 tablet  1 tablet Oral Daily Cathlyn Parsons, PA-C   1 tablet at 12/14/21 5631   camphor-menthol (SARNA) lotion   Topical PRN Cathlyn Parsons, PA-C       ceFEPIme (MAXIPIME) 1 g in sodium chloride 0.9 % 100 mL IVPB  1 g Intravenous q1800 Pham, Minh Q, RPH-CPP   Stopped at 12/13/21 1628    chlorhexidine (PERIDEX) 0.12 % solution 15 mL  15 mL Mouth Rinse BID Cathlyn Parsons, PA-C   15 mL at 12/14/21 4970   Chlorhexidine Gluconate Cloth 2 % PADS 6 each  6 each Topical Q0600 Rosita Fire, MD   6 each at 12/14/21 2637   dicyclomine (BENTYL) tablet 10 mg  10 mg Oral TID AC Pham, Minh Q, RPH-CPP   10 mg at 12/14/21 1203   docusate sodium (COLACE) capsule 100 mg  100 mg Oral BID PRN Cathlyn Parsons, PA-C   100 mg at 12/12/21 8588   DULoxetine (CYMBALTA) DR capsule 20 mg  20 mg Oral Daily Cathlyn Parsons, PA-C   20 mg at 12/14/21 5027   feeding supplement (NEPRO CARB STEADY) liquid 237 mL  237 mL Oral TID BM AngiulliLavon Paganini, PA-C 0 mL/hr at 12/13/21 0513 237 mL at 12/14/21 7412  feeding supplement (OSMOLITE 1.5 CAL) liquid 975 mL  975 mL Per Tube Q24H Lovorn, Megan, MD   Stopping Infusion hung by another clincian at 12/14/21 0700   feeding supplement (PROSource TF) liquid 45 mL  45 mL Per Tube BID Lovorn, Jinny Blossom, MD   45 mL at 54/56/25 6389   folic acid (FOLVITE) tablet 2 mg  2 mg Oral Daily Cathlyn Parsons, PA-C   2 mg at 12/14/21 3734   Gerhardt's butt cream   Topical PRN Cathlyn Parsons, PA-C   Given at 12/10/21 2876   heparin sodium (porcine) injection 3,200 Units  3,200 Units Intracatheter Once Claudia Desanctis, MD       insulin aspart (novoLOG) injection 0-5 Units  0-5 Units Subcutaneous QHS Lovorn, Jinny Blossom, MD   4 Units at 12/12/21 2208   insulin aspart (novoLOG) injection 0-9 Units  0-9 Units Subcutaneous TID WC Lovorn, Jinny Blossom, MD   7 Units at 12/14/21 1203   insulin detemir (LEVEMIR) injection 8 Units  8 Units Subcutaneous BID Lovorn, Jinny Blossom, MD   8 Units at 12/14/21 0824   ipratropium-albuterol (DUONEB) 0.5-2.5 (3) MG/3ML nebulizer solution 3 mL  3 mL Nebulization Q4H PRN Angiulli, Lavon Paganini, PA-C       lidocaine (LIDODERM) 5 % 2 patch  2 patch Transdermal Q24H Lovorn, Megan, MD   2 patch at 12/11/21 2145   lidocaine (XYLOCAINE) 2 % viscous mouth solution 15 mL   15 mL Oral Q6H PRN Angiulli, Lavon Paganini, PA-C       megestrol (MEGACE) 400 MG/10ML suspension 800 mg  800 mg Oral Daily Lovorn, Megan, MD   800 mg at 12/14/21 8115   mupirocin cream (BACTROBAN) 2 % 1 application  1 application Topical BID Cathlyn Parsons, PA-C   1 application at 72/62/03 0825   ondansetron (ZOFRAN) tablet 4 mg  4 mg Oral Q8H PRN Cathlyn Parsons, PA-C   4 mg at 12/11/21 5597   oxyCODONE (Oxy IR/ROXICODONE) immediate release tablet 5 mg  5 mg Oral Q4H PRN Cathlyn Parsons, PA-C   5 mg at 12/14/21 0158   pantoprazole (PROTONIX) EC tablet 40 mg  40 mg Oral BID Cathlyn Parsons, PA-C   40 mg at 12/14/21 4163   senna (SENOKOT) tablet 17.2 mg  2 tablet Oral Daily Lovorn, Megan, MD   17.2 mg at 12/14/21 0823   temazepam (RESTORIL) capsule 15 mg  15 mg Oral QHS PRN Angiulli, Lavon Paganini, PA-C       white petrolatum (VASELINE) gel   Topical PRN Cathlyn Parsons, PA-C         Discharge Medications: Please see discharge summary for a list of discharge medications.  Relevant Imaging Results:  Relevant Lab Results:   Additional Information [id number]  Rana Snare, LCSW

## 2021-12-14 NOTE — Progress Notes (Signed)
PROGRESS NOTE   Subjective/Complaints:   Pt was very confused last night-  Per daughter- didn't sleep well due to confusion/agitation.   Wet bed and also went to bathroom and voided after bladder scan in 400s.  Seroquel was "too much" per daughter in past- but given Restoril- which is helpful. Also got Xanax last night as well. Sleeping now.   WBC up to 17.1 even on Cefepime.  Na down slightly to 129.  Also had BM overnight as well.   ROS: Limited by cognition/sedation   Objective:   DG Abd Portable 1V  Result Date: 12/12/2021 CLINICAL DATA:  Feeding tube placement EXAM: PORTABLE ABDOMEN - 1 VIEW COMPARISON:  None. FINDINGS: Feeding tube in the mid to distal body of the stomach. Normal bowel gas pattern.  No abnormal calcifications. IMPRESSION: Feeding tube tip in the mid distal body of the stomach. Electronically Signed   By: Franchot Gallo M.D.   On: 12/12/2021 11:49   Recent Labs    12/13/21 0541 12/14/21 0628  WBC 14.9* 17.1*  HGB 8.6* 9.4*  HCT 27.2* 29.0*  PLT 139* 169   Recent Labs    12/12/21 1216 12/14/21 0628  NA 136 129*  K 4.0 4.6  CL 100 94*  CO2 26 24  GLUCOSE 172* 594*  BUN 26* 32*  CREATININE 3.36* 3.30*  CALCIUM 9.2 9.1    Intake/Output Summary (Last 24 hours) at 12/14/2021 4967 Last data filed at 12/14/2021 0009 Gross per 24 hour  Intake 403.75 ml  Output --  Net 403.75 ml     Pressure Injury 12/04/21 Head Posterior Unstageable - Full thickness tissue loss in which the base of the injury is covered by slough (yellow, tan, gray, green or brown) and/or eschar (tan, brown or black) in the wound bed. 3 cm round, scabbed area to po (Active)  12/04/21 2000  Location: Head  Location Orientation: Posterior  Staging: Unstageable - Full thickness tissue loss in which the base of the injury is covered by slough (yellow, tan, gray, green or brown) and/or eschar (tan, brown or black) in the wound  bed.  Wound Description (Comments): 3 cm round, scabbed area to posterior head.  Present on Admission: Yes    Physical Exam: Vital Signs Blood pressure (!) 162/72, pulse (!) 104, temperature 98.9 F (37.2 C), temperature source Oral, resp. rate 20, height 5\' 5"  (1.651 m), weight 64.9 kg, SpO2 96 %.       General: asleep supine in bed; daughter in room; NAD HENT: conjugate gaze; oropharynx dry- has cortrak CV: mildly tachycardic rate; regular rhythm; no JVD Pulmonary: CTA B/L; no W/R/R- good air movement GI: soft, NT, ND, (+)BS Psychiatric:asleep Neurological: asleep Skin: unstageable wound with scabbing on back of head 3-4cm in diameter- also has popped/dry blister on L sternum- slough gone from base- looks better- both look the same today Genitourinary:    Comments: Foley tube in place- medium clear, yellow urine in bag- no change Musculoskeletal:     Cervical back: Neck supple. No tenderness.     Comments: UE 5-/5 B/L  LE 3/5 HF/KE- DF 4-/5 on L and 2-/5 on R; PF 4-/5 B/L  R foot  drop- at rest in PF   Skin:    General: Skin is warm and dry. Wound on occiput/matted with hair    Comments: R chest HD catheter  Neuro: Mild tremors with movement. Reasonable sitting balance Numb/absent/decreased sensation from mid calf to toes B/L L>R Vague- Ox2 with cues   Assessment/Plan: 1. Functional deficits which require 3+ hours per day of interdisciplinary therapy in a comprehensive inpatient rehab setting. Physiatrist is providing close team supervision and 24 hour management of active medical problems listed below. Physiatrist and rehab team continue to assess barriers to discharge/monitor patient progress toward functional and medical goals  Care Tool:  Bathing    Body parts bathed by patient: Right arm, Left arm, Chest, Right upper leg, Left upper leg, Face, Front perineal area, Abdomen, Buttocks   Body parts bathed by helper: Front perineal area, Buttocks, Right lower leg,  Left lower leg     Bathing assist Assist Level: Minimal Assistance - Patient > 75%     Upper Body Dressing/Undressing Upper body dressing   What is the patient wearing?: Pull over shirt    Upper body assist Assist Level: Minimal Assistance - Patient > 75%    Lower Body Dressing/Undressing Lower body dressing      What is the patient wearing?: Pants     Lower body assist Assist for lower body dressing: Moderate Assistance - Patient 50 - 74%     Toileting Toileting Toileting Activity did not occur Landscape architect and hygiene only): N/A (no void or bm)  Toileting assist Assist for toileting: Maximal Assistance - Patient 25 - 49%     Transfers Chair/bed transfer  Transfers assist     Chair/bed transfer assist level: Dependent - mechanical lift (stedy)     Locomotion Ambulation   Ambulation assist   Ambulation activity did not occur: Safety/medical concerns          Walk 10 feet activity   Assist  Walk 10 feet activity did not occur: Safety/medical concerns        Walk 50 feet activity   Assist Walk 50 feet with 2 turns activity did not occur: Safety/medical concerns         Walk 150 feet activity   Assist Walk 150 feet activity did not occur: Safety/medical concerns         Walk 10 feet on uneven surface  activity   Assist Walk 10 feet on uneven surfaces activity did not occur: Safety/medical concerns         Wheelchair     Assist Is the patient using a wheelchair?: Yes Type of Wheelchair: Manual    Wheelchair assist level: Minimal Assistance - Patient > 75% Max wheelchair distance: 100'    Wheelchair 50 feet with 2 turns activity    Assist        Assist Level: Minimal Assistance - Patient > 75%   Wheelchair 150 feet activity     Assist      Assist Level: Dependent - Patient 0%   Blood pressure (!) 162/72, pulse (!) 104, temperature 98.9 F (37.2 C), temperature source Oral, resp. rate 20,  height 5\' 5"  (1.651 m), weight 64.9 kg, SpO2 96 %.  Medical Problem List and Plan: 1. Functional deficits secondary to debility related to VF cardiac arrest secondary to massive pulmonary emboli status post mechanical thrombectomy             -patient may not shower due to HD catheter- unless it's removed             -  ELOS/Goals: 10-14 days supervision to min A for PT/OT SLP  -con't CIR- Continue CIR- PT, OT and SLP  Continue CIR- PT, OT and SLP Family conference today at 11am- to discuss pt's care- and dispo  2/17- looking to see if can do SNF after medical more stable- con't CIR 2.  Antithrombotics: -DVT/anticoagulation:  Pharmaceutical: Other (comment) Eliquis             -antiplatelet therapy: N/A 3. Pain Management: Neurontin 100 mg twice daily, oxycodone as needed  2/9- had been on gabapentin for nerve pain-will stop due to sedation- will start RLE lidoderm patch 2 patches for pain.   2/13  more alert off gabapentin in spite of being on at home prior for neuropathy 4. Mood anxiety-  Cymbalta 20 mg daily, Xanax 0.25 mg daily as needed provide emotional support             -antipsychotic agents: N/A 5. Neuropsych: This patient isnot capable of making decisions on her own behalf. 6. Skin/Wound Care: Routine skin checks 7. Fluids/Electrolytes/Nutrition: still not eating much  -continue TF for now.   -hopefully po intake will pick up as mentation improves.  8.  Retroperitoneal bleed/hemorrhagic shock/acute blood loss anemia/status post left liver lobe embolization  2/9- will check LFTs tomorrow to make sure liver function doing OK?  2/11- LFTs look great- con't to monitor every so often 9.  AKI secondary to ATN.  Follow-up per renal services.  Started clip for AKI- not ESRD, however Cr almost 5 today- getting HD tomorrow 2/8.   2/9- last Cr 5.74- up from 4.97 up from 4.45- rising- per renal- likely HD tomorrow?.   2/16- Cr 3.36 and BUN 26- per renal  10.  Thrombocytopenia.  Suspect ITP.   Follow-up per hematology  2/13- plts 83k. Continue to monitor (has ranged between 60k and 110k)  2/16- Plts up to 139k 11.  Hyperlipidemia.  Lipitor 12. Diabetes- Poor appetite- has Cortrak for intake due to poor appetite-stop insulin- was on at home- levemir 4 units BID when came to rehab, but dropping CBGs- so will hold- will do CBGs BID Will assess if still needs- d/c insulin due to Tfs  2/11- TF's restarted so CBGs going up- 486 this AM- restarted Levemir 4 units BID and SSI- will titrate as required.   2/12- got called about Bgs in 400s- again this AM- due to TF's- will increase Levemir to 8 units BID and monitor-   2/13 levemir just increased--observe today  2/14- will consult DM coordiantor to help- d/c'd boost breeze- too many carbs-   2/15- CBGs 77-177- with Novolog 2 units q4 hours while on TF's  2/16- CBGs 83-387- was doing better, however spiked again last night- just made change yesterday- give 1 day-   2/17- Cbg was 594 on BMP this AM- had been 140s-150s since changes- but spiked- not clear why- covered with SSI- likely due to UTI- that's she's on Cefepime for  13. New onset confusion/poor memory- anoxia? From prolonged CPR  2/9- will check CT of head- and stop Gabapentin for nerve pain due to increased sedation- had before but so out of it per daughter- much worse last 2 days.   2/15- variable -tangential and  poor memory- will need to d/w daughter 80. ABLA with chronic anemia  2/13 hb stable at 8.2 15. Scab on back of head-pressure ulcer unstageable/Blister on sternum   2/9- on the back of her head  2/10- will need to have hair washed to  see if we can see wound that's on CT  2/13 [facility] RN examined wound today --peroxide and mupirocin bid with dry dressing  2/15- touched base with nursing- stable- unstageable on back of head- blister healing 16. HTN with hx of VF arrest  2/10- pt's BP 178/95- running 470L/295F- systolic- will d/w renal since not on any BP meds-   2/11- Started  norvasc and BP looks better 473 systolic- will con't to monitor trend.   2/13 bp borderline but improved from baseline--continue curent meds  2/14- BP borderline, - con't regimen 17. Constipation  2/14- will give sorbitol- 30 cc after therapy- 2pm  2/17- LBM overnight 18. Leukocytosis-UTI_ Cx pending  2/15- will remove foley and bladder scan; also check U/A and Cx- foley was per renal .   2/17- pt has UTI- started on Cefepime yesterday- also WBC up to 17k today, but spoke with Pharmacy- they agree that we need to continue the course- is on broad spectrum IV ABX- will recheck labs in AM and monitor for Cx results- Cx was reincubated.  19. Fall  2/16- pt on floor last night- denied pain- CT head was ordered - pt refused to keep on mask to go downstairs- will get this AM.  20. Poor appetite-  2/17- will get PEG placed next week so can remove Cortrak- spoke with PA to get ordered   I spent a total of  52   minutes on total care today- >50% coordination of care- due to d/w daughter, as well as PA regarding PEG. And d/w pharmacy   LOS: 10 days A FACE TO FACE EVALUATION WAS PERFORMED  Carlise Stofer 12/14/2021, 9:05 AM

## 2021-12-15 ENCOUNTER — Inpatient Hospital Stay (HOSPITAL_COMMUNITY): Payer: Medicare PPO

## 2021-12-15 LAB — CBC WITH DIFFERENTIAL/PLATELET
Abs Immature Granulocytes: 0.08 K/uL — ABNORMAL HIGH (ref 0.00–0.07)
Basophils Absolute: 0.1 K/uL (ref 0.0–0.1)
Basophils Relative: 1 %
Eosinophils Absolute: 0.1 K/uL (ref 0.0–0.5)
Eosinophils Relative: 0 %
HCT: 24.4 % — ABNORMAL LOW (ref 36.0–46.0)
Hemoglobin: 7.7 g/dL — ABNORMAL LOW (ref 12.0–15.0)
Immature Granulocytes: 1 %
Lymphocytes Relative: 13 %
Lymphs Abs: 1.7 K/uL (ref 0.7–4.0)
MCH: 28.4 pg (ref 26.0–34.0)
MCHC: 31.6 g/dL (ref 30.0–36.0)
MCV: 90 fL (ref 80.0–100.0)
Monocytes Absolute: 1.6 K/uL — ABNORMAL HIGH (ref 0.1–1.0)
Monocytes Relative: 12 %
Neutro Abs: 9.8 K/uL — ABNORMAL HIGH (ref 1.7–7.7)
Neutrophils Relative %: 73 %
Platelets: 169 K/uL (ref 150–400)
RBC: 2.71 MIL/uL — ABNORMAL LOW (ref 3.87–5.11)
RDW: 15.6 % — ABNORMAL HIGH (ref 11.5–15.5)
WBC: 13.2 K/uL — ABNORMAL HIGH (ref 4.0–10.5)
nRBC: 0 % (ref 0.0–0.2)

## 2021-12-15 LAB — RENAL FUNCTION PANEL
Albumin: 2.7 g/dL — ABNORMAL LOW (ref 3.5–5.0)
Anion gap: 9 (ref 5–15)
BUN: 28 mg/dL — ABNORMAL HIGH (ref 8–23)
CO2: 26 mmol/L (ref 22–32)
Calcium: 8.5 mg/dL — ABNORMAL LOW (ref 8.9–10.3)
Chloride: 93 mmol/L — ABNORMAL LOW (ref 98–111)
Creatinine, Ser: 2.45 mg/dL — ABNORMAL HIGH (ref 0.44–1.00)
GFR, Estimated: 20 mL/min — ABNORMAL LOW (ref >60)
Glucose, Bld: 603 mg/dL (ref 70–99)
Phosphorus: 2.6 mg/dL (ref 2.5–4.6)
Potassium: 4.7 mmol/L (ref 3.5–5.1)
Sodium: 128 mmol/L — ABNORMAL LOW (ref 135–145)

## 2021-12-15 LAB — URINE CULTURE: Culture: 20000 — AB

## 2021-12-15 LAB — GLUCOSE, CAPILLARY
Glucose-Capillary: 576 mg/dL (ref 70–99)
Glucose-Capillary: 437 mg/dL — ABNORMAL HIGH (ref 70–99)
Glucose-Capillary: 216 mg/dL — ABNORMAL HIGH (ref 70–99)
Glucose-Capillary: 102 mg/dL — ABNORMAL HIGH (ref 70–99)
Glucose-Capillary: 270 mg/dL — ABNORMAL HIGH (ref 70–99)

## 2021-12-15 MED ORDER — INSULIN ASPART 100 UNIT/ML IJ SOLN
0.0000 [IU] | Freq: Three times a day (TID) | INTRAMUSCULAR | Status: DC
  Administered 2021-12-15: 15 [IU] via SUBCUTANEOUS
  Administered 2021-12-15: 5 [IU] via SUBCUTANEOUS
  Administered 2021-12-16: 15 [IU] via SUBCUTANEOUS

## 2021-12-15 NOTE — Progress Notes (Signed)
PROGRESS NOTE   Subjective/Complaints: Confusion improved according to patient's daughter at bedside as well as her speech therapist.  Discussed CT head results.  Also discussed lab work including a drop in her white count from 17-13 as well as a drop in her hemoglobin from 9.4-7.7. Daughter would like to discuss DNR, she personally does not want her mom to be DNR but her sister would like her to be DNR.  There is no healthcare power of attorney. Discussed with patient explained what resuscitation is.  The patient showed me scars from previous resuscitative efforts and clearly stated she did not wish to have these.  She stated it is in God's hands.  ROS: Limited by cognition/sedation   Objective:   CT HEAD WO CONTRAST (5MM)  Result Date: 12/14/2021 CLINICAL DATA:  Altered mental status EXAM: CT HEAD WITHOUT CONTRAST TECHNIQUE: Contiguous axial images were obtained from the base of the skull through the vertex without intravenous contrast. RADIATION DOSE REDUCTION: This exam was performed according to the departmental dose-optimization program which includes automated exposure control, adjustment of the mA and/or kV according to patient size and/or use of iterative reconstruction technique. COMPARISON:  12/06/2021 FINDINGS: Brain: The brainstem, cerebellum, cerebral peduncles, thalami, basal ganglia, basilar cisterns, and ventricular system appear within normal limits. No intracranial hemorrhage, mass lesion, or acute CVA. Vascular: There is atherosclerotic calcification of the cavernous carotid arteries bilaterally. Skull: Unremarkable Sinuses/Orbits: Unremarkable Other: Stable cutaneous thickening and subcutaneous stranding along the left posterior occipitoparietal scalp, present on 12/06/2021 but not on 11/08/2021, probably a scalp hematoma or contusion. IMPRESSION: 1. No acute intracranial findings. 2. Stable focal scalp lesion along the  left posterior occipitoparietal region, possibly a scalp hematoma or contusion. 3. Atherosclerosis. Electronically Signed   By: Van Clines M.D.   On: 12/14/2021 13:07   Recent Labs    12/14/21 0628 12/15/21 0641  WBC 17.1* 13.2*  HGB 9.4* 7.7*  HCT 29.0* 24.4*  PLT 169 169    Recent Labs    12/14/21 0628 12/15/21 0641  NA 129* 128*  K 4.6 4.7  CL 94* 93*  CO2 24 26  GLUCOSE 594* 603*  BUN 32* 28*  CREATININE 3.30* 2.45*  CALCIUM 9.1 8.5*     Intake/Output Summary (Last 24 hours) at 12/15/2021 1014 Last data filed at 12/14/2021 1730 Gross per 24 hour  Intake --  Output 863 ml  Net -863 ml      Pressure Injury 12/04/21 Head Posterior Unstageable - Full thickness tissue loss in which the base of the injury is covered by slough (yellow, tan, gray, green or brown) and/or eschar (tan, brown or black) in the wound bed. 3 cm round, scabbed area to po (Active)  12/04/21 2000  Location: Head  Location Orientation: Posterior  Staging: Unstageable - Full thickness tissue loss in which the base of the injury is covered by slough (yellow, tan, gray, green or brown) and/or eschar (tan, brown or black) in the wound bed.  Wound Description (Comments): 3 cm round, scabbed area to posterior head.  Present on Admission: Yes    Physical Exam: Vital Signs Blood pressure 116/66, pulse 95, temperature 98.9 F (37.2 C), temperature source  Oral, resp. rate 18, height 5\' 5"  (1.651 m), weight 66.6 kg, SpO2 100 %.  General: No acute distress Mood and affect are appropriate Heart: Regular rate and rhythm no rubs murmurs or extra sounds Lungs: Clear to auscultation, breathing unlabored, no rales or wheezes Abdomen: Positive bowel sounds, soft nontender to palpation, nondistended Extremities: No clubbing, cyanosis, or edema Skin: No evidence of breakdown, no evidence of rash  Psychiatric:asleep Neurological: asleep Skin: unstageable wound with scabbing on back of head 3-4cm in  diameter- also has popped/dry blister on L sternum- slough gone from base- looks better- both look the same today Genitourinary:    Comments: Foley tube in place- medium clear, yellow urine in bag- no change Musculoskeletal:     Cervical back: Neck supple. No tenderness.     Comments: UE 5-/5 B/L  LE 3/5 HF/KE- DF 4-/5 on L and 2-/5 on R; PF 4-/5 B/L  R foot drop- at rest in PF   Skin:    General: Skin is warm and dry. Wound on occiput/matted with hair    Comments: R chest HD catheter  Neuro: Mild tremors with movement. Reasonable sitting balance Numb/absent/decreased sensation from mid calf to toes B/L L>R Vague- Ox2 with cues   Assessment/Plan: 1. Functional deficits which require 3+ hours per day of interdisciplinary therapy in a comprehensive inpatient rehab setting. Physiatrist is providing close team supervision and 24 hour management of active medical problems listed below. Physiatrist and rehab team continue to assess barriers to discharge/monitor patient progress toward functional and medical goals  Care Tool:  Bathing    Body parts bathed by patient: Right arm, Left arm, Chest, Right upper leg, Left upper leg, Face, Front perineal area, Abdomen, Buttocks   Body parts bathed by helper: Front perineal area, Buttocks, Right lower leg, Left lower leg     Bathing assist Assist Level: Minimal Assistance - Patient > 75%     Upper Body Dressing/Undressing Upper body dressing   What is the patient wearing?: Pull over shirt    Upper body assist Assist Level: Minimal Assistance - Patient > 75%    Lower Body Dressing/Undressing Lower body dressing      What is the patient wearing?: Pants     Lower body assist Assist for lower body dressing: Moderate Assistance - Patient 50 - 74%     Toileting Toileting Toileting Activity did not occur Landscape architect and hygiene only): N/A (no void or bm)  Toileting assist Assist for toileting: Maximal Assistance - Patient 25 -  49%     Transfers Chair/bed transfer  Transfers assist     Chair/bed transfer assist level: Dependent - mechanical lift (stedy)     Locomotion Ambulation   Ambulation assist   Ambulation activity did not occur: Safety/medical concerns          Walk 10 feet activity   Assist  Walk 10 feet activity did not occur: Safety/medical concerns        Walk 50 feet activity   Assist Walk 50 feet with 2 turns activity did not occur: Safety/medical concerns         Walk 150 feet activity   Assist Walk 150 feet activity did not occur: Safety/medical concerns         Walk 10 feet on uneven surface  activity   Assist Walk 10 feet on uneven surfaces activity did not occur: Safety/medical concerns         Wheelchair     Assist Is the patient  using a wheelchair?: Yes Type of Wheelchair: Manual    Wheelchair assist level: Minimal Assistance - Patient > 75% Max wheelchair distance: 100'    Wheelchair 50 feet with 2 turns activity    Assist        Assist Level: Minimal Assistance - Patient > 75%   Wheelchair 150 feet activity     Assist      Assist Level: Dependent - Patient 0%   Blood pressure 116/66, pulse 95, temperature 98.9 F (37.2 C), temperature source Oral, resp. rate 18, height 5\' 5"  (1.651 m), weight 66.6 kg, SpO2 100 %.  Medical Problem List and Plan: 1. Functional deficits secondary to debility related to VF cardiac arrest secondary to massive pulmonary emboli status post mechanical thrombectomy             -patient may not shower due to HD catheter- unless it's removed             -ELOS/Goals: 10-14 days supervision to min A for PT/OT SLP  -con't CIR- Continue CIR- PT, OT and SLP  Continue CIR- PT, OT and SLP Family conference today at 11am- to discuss pt's care- and dispo  2/17- looking to see if can do SNF after medical more stable- con't CIR 2.  Antithrombotics: -DVT/anticoagulation:  Pharmaceutical: Other  (comment) Eliquis             -antiplatelet therapy: N/A 3. Pain Management: Neurontin 100 mg twice daily, oxycodone as needed  2/9- had been on gabapentin for nerve pain-will stop due to sedation- will start RLE lidoderm patch 2 patches for pain.   2/13  more alert off gabapentin in spite of being on at home prior for neuropathy 4. Mood anxiety-  Cymbalta 20 mg daily, Xanax 0.25 mg daily as needed provide emotional support             -antipsychotic agents: N/A 5. Neuropsych: This patient isnot capable of making decisions on her own behalf. 6. Skin/Wound Care: Routine skin checks 7. Fluids/Electrolytes/Nutrition: still not eating much  -continue TF for now.   -hopefully po intake will pick up as mentation improves.  8.  Retroperitoneal bleed/hemorrhagic shock/acute blood loss anemia/status post left liver lobe embolization  Hgb down again to 7.7 repeat non contrast CT abd 9.  AKI secondary to ATN.  Follow-up per renal services.  Started clip for AKI- not ESRD, however Cr almost 5 today- getting HD tomorrow 2/8.   2/9- last Cr 5.74- up from 4.97 up from 4.45- rising- per renal- likely HD tomorrow?.   2/16- Cr 3.36 and BUN 26- per renal  10.  Thrombocytopenia.  Suspect ITP.  Follow-up per hematology  2/13- plts 83k. Continue to monitor (has ranged between 60k and 110k)  2/16- Plts up to 139k 11.  Hyperlipidemia.  Lipitor 12. Diabetes- Poor appetite- has Cortrak for intake due to poor appetite-stop insulin- was on at home- levemir 4 units BID when came to rehab, but dropping CBGs- so will hold- will do CBGs BID Will assess if still needs- d/c insulin due to Tfs  2/11- TF's restarted so CBGs going up- 486 this AM- restarted Levemir 4 units BID and SSI- will titrate as required.   2/12- got called about Bgs in 400s- again this AM- due to TF's- will increase Levemir to 8 units BID and monitor-   2/13 levemir just increased--observe today  2/14- will consult DM coordiantor to help- d/c'd boost  breeze- too many carbs-   2/15- CBGs 77-177- with  Novolog 2 units q4 hours while on TF's  2/16- CBGs 83-387- was doing better, however spiked again last night- just made change yesterday- give 1 day-   2/17- Cbg was 594 on BMP this AM-  CBG (last 3)  Recent Labs    12/14/21 2111 12/15/21 0604 12/15/21 0930  GLUCAP 358* 576* 437*   Changed to moderate SSI 13. New onset confusion/poor memory- anoxia? From prolonged CPR  2/9- will check CT of head- and stop Gabapentin for nerve pain due to increased sedation- had before but so out of it per daughter- much worse last 2 days.   2/15- variable -tangential and  poor memory- will need to d/w daughter 83. ABLA with chronic anemia  2/13 hb stable at 8.2 15. Scab on back of head-pressure ulcer unstageable/Blister on sternum   2/9- on the back of her head  2/10- will need to have hair washed to see if we can see wound that's on CT  2/13 [place] RN examined wound today --peroxide and mupirocin bid with dry dressing  2/15- touched base with nursing- stable- unstageable on back of head- blister healing 16. HTN with hx of VF arrest  2/10- pt's BP 178/95- running 859M/931P- systolic- will d/w renal since not on any BP meds-   2/11- Started norvasc and BP looks better 216 systolic- will con't to monitor trend.    Vitals:   12/14/21 1929 12/15/21 0359  BP: 120/64 116/66  Pulse: (!) 108 95  Resp: 18 18  Temp: 97.6 F (36.4 C) 98.9 F (37.2 C)  SpO2: 100% 100%    17. Constipation  2/14- will give sorbitol- 30 cc after therapy- 2pm  2/17- LBM overnight 18. Leukocytosis-UTI_ Cx pending  2/15- will remove foley and bladder scan; also check U/A and Cx- foley was per renal .   2/17- pt has UTI- started on Cefepime yesterday- also WBC up to 17k  2/18 WBC down to 13.2K  19. Fall  2/16- pt on floor last night- denied pain- CT head was ordered - pt refused to keep on mask to go downstairs- will get this AM.  20. Poor appetite-  2/17- will get PEG placed  next week so can remove Cortrak- spoke with PA to get ordered   I spent a total of  52   minutes on total care today- >50% coordination of care- due to d/w daughter, as well as PA regarding PEG. And d/w pharmacy   LOS: 11 days A FACE TO FACE EVALUATION WAS PERFORMED  Charlett Blake 12/15/2021, 10:14 AM

## 2021-12-15 NOTE — Progress Notes (Signed)
Speech Language Pathology Daily Session Note  Patient Details  Name: [name] MRN: [id number] Date of Birth: 1951-02-25  Today's Date: 12/15/2021 SLP Individual Time: 1002-1100 SLP Individual Time Calculation (min): 58 min  Short Term Goals: Week 2: SLP Short Term Goal 1 (Week 2): Patient will demonstrate sustained attention to functional tasks for 5 minutes with Mod verbal cues for redirection. SLP Short Term Goal 2 (Week 2): Patient will initaite functional tasks with Mod A multimodal cues. SLP Short Term Goal 3 (Week 2): Patient will demonstrate orientation to place, time and situation with Mod verbal and visual cues. SLP Short Term Goal 4 (Week 2): Patient will identify 2 cognitive and 2 physical deficits with Mod multimodal cues. SLP Short Term Goal 5 (Week 2): Patient will consume current diet with minimal overt s/s of aspiration with Min verbal cues for use of swallowing compensatory strategies.  Skilled Therapeutic Interventions: Pt seen for skilled ST with focus on cognitive and swallowing goals, daughter present throughout. Pt initially sleeping but rouses easily and maintains alertness/attention throughout hour long tx session. Pt continues with very poor PO intake per daughter, just a few bites occasionally before refusing. Pt agreeable to 3 single bites of diced peaches, extended mastication with decreased coordination during oral phase noted. Pt did expectorate last bite, pt with no overt s/s aspiration during any trials. Pt observed with 2 sips of thin grape juice via straw also without s/s aspiration. Max encouragement unfortunately did not increase intake. Nursing in and out for tube feeding and MD in and out for rounds. Pt demonstrates appropriate orientation to place, time and situation with overall min A cues. Making her wants/needs known with improved accuracy today with less confusion noted, some laughter! Daughter present and supportive/motivating for patient. Pt left in  bed with daughter present for needs, cont ST POC.   Pain Pain Assessment Pain Scale: 0-10 Pain Score: 0-No pain PAINAD (Pain Assessment in Advanced Dementia) Breathing: normal Negative Vocalization: none Facial Expression: smiling or inexpressive Body Language: relaxed Consolability: no need to console PAINAD Score: 0  Therapy/Group: Individual Therapy  Dewaine Conger 12/15/2021, 10:59 AM

## 2021-12-15 NOTE — Progress Notes (Signed)
[facility] NEPHROLOGY PROGRESS NOTE  Assessment/ Plan:  #Acute kidney injury secondary due to ischemic ATN in the setting of cardiac arrest, shock and contrast.  She has underlying CKD with baseline creatinine level around 1.3 back in 2021.  She has been RRT dependent since 10/31/2021 which was discussed with the patient and family.  Now receiving HD per MWF schedule.  See already has a spot at [facility] schedule second shift for AKI.   Continue dialysis on MWF schedule for now  #V-fib cardiac arrest outside the hospital due to massive PE.  #Massive PE status post mechanical thrombectomy on 1/2. Vasc US- no DVT, liver mass noted 10/28/21 CT abd/ pelvis, s/p MRI abd--> incompletely characterized, had MRI with gadolinium 1/20  to reevaluate -- not definitive. S/p hem/onc eval.  # Retroperitoneal bleed/Hemorrhagic shock/Acute blood loss anemia originating from left liver lobe s/p embolization 1/4--> required FFP, plts, Vit K, pRBCs; now stable. Hematology following - no ESA due to concern for her recent thromboembolic disease. Transfuse when needed   # Thrombocytopenia- suspected ITP, s/p Nplate PRN and dex. -  work up and mgmt per heme.  Overall much improved  # Acute hypoxic RF- extubated 11/07/21, now much improved  #Hyperglycemia/DM2: insulin management per primary team  #Encephalopathy: Likely multifactorial with delirium being the main contributor.  Management per primary team  #Leukocytosis: On antibiotics with cefepime.  Management per primary team  Subjective: Patient lying in bed with no complaints.  Hyperglycemia again  Objective Vital signs in last 24 hours: Vitals:   12/14/21 1825 12/14/21 1929 12/15/21 0355 12/15/21 0359  BP: 112/67 120/64  116/66  Pulse: (!) 105 (!) 108  95  Resp: 16 18  18   Temp: 98.5 F (36.9 C) 97.6 F (36.4 C)  98.9 F (37.2 C)  TempSrc: Oral Oral  Oral  SpO2: 98% 100%  100%  Weight:   66.6 kg   Height:       Weight change: 1.136  kg  Intake/Output Summary (Last 24 hours) at 12/15/2021 0841 Last data filed at 12/14/2021 1730 Gross per 24 hour  Intake --  Output 863 ml  Net -863 ml       Labs: Basic Metabolic Panel: Recent Labs  Lab 12/12/21 1216 12/14/21 0628 12/15/21 0641  NA 136 129* 128*  K 4.0 4.6 4.7  CL 100 94* 93*  CO2 26 24 26   GLUCOSE 172* 594* 603*  BUN 26* 32* 28*  CREATININE 3.36* 3.30* 2.45*  CALCIUM 9.2 9.1 8.5*  PHOS 3.4 2.8 2.6   Liver Function Tests: Recent Labs  Lab 12/12/21 1216 12/14/21 0628 12/15/21 0641  ALBUMIN 2.9* 3.1* 2.7*   No results for input(s): LIPASE, AMYLASE in the last 168 hours. No results for input(s): AMMONIA in the last 168 hours. CBC: Recent Labs  Lab 12/10/21 0516 12/12/21 1216 12/13/21 0541 12/14/21 0628 12/15/21 0641  WBC 12.5* 15.1* 14.9* 17.1* 13.2*  NEUTROABS  --   --  10.9* 14.5* 9.8*  HGB 8.2* 8.4* 8.6* 9.4* 7.7*  HCT 25.4* 26.3* 27.2* 29.0* 24.4*  MCV 90.1 89.5 89.8 88.7 90.0  PLT 83* 123* 139* 169 169   Cardiac Enzymes: No results for input(s): CKTOTAL, CKMB, CKMBINDEX, TROPONINI in the last 168 hours. CBG: Recent Labs  Lab 12/14/21 1146 12/14/21 1300 12/14/21 1825 12/14/21 2111 12/15/21 0604  GLUCAP 314* 214* 103* 358* 576*    Iron Studies: No results for input(s): IRON, TIBC, TRANSFERRIN, FERRITIN in the last 72 hours. Studies/Results: CT HEAD  WO CONTRAST (5MM)  Result Date: 12/14/2021 CLINICAL DATA:  Altered mental status EXAM: CT HEAD WITHOUT CONTRAST TECHNIQUE: Contiguous axial images were obtained from the base of the skull through the vertex without intravenous contrast. RADIATION DOSE REDUCTION: This exam was performed according to the departmental dose-optimization program which includes automated exposure control, adjustment of the mA and/or kV according to patient size and/or use of iterative reconstruction technique. COMPARISON:  12/06/2021 FINDINGS: Brain: The brainstem, cerebellum, cerebral peduncles, thalami,  basal ganglia, basilar cisterns, and ventricular system appear within normal limits. No intracranial hemorrhage, mass lesion, or acute CVA. Vascular: There is atherosclerotic calcification of the cavernous carotid arteries bilaterally. Skull: Unremarkable Sinuses/Orbits: Unremarkable Other: Stable cutaneous thickening and subcutaneous stranding along the left posterior occipitoparietal scalp, present on 12/06/2021 but not on 11/08/2021, probably a scalp hematoma or contusion. IMPRESSION: 1. No acute intracranial findings. 2. Stable focal scalp lesion along the left posterior occipitoparietal region, possibly a scalp hematoma or contusion. 3. Atherosclerosis. Electronically Signed   By: Van Clines M.D.   On: 12/14/2021 13:07    Medications: Infusions:  sodium chloride Stopped (12/13/21 2249)   ceFEPime (MAXIPIME) IV 1 g (12/14/21 1835)    Scheduled Medications:  ALPRAZolam  0.25 mg Oral Daily   amLODipine  10 mg Oral Q2000   apixaban  5 mg Oral BID   atorvastatin  20 mg Oral Daily   B-complex with vitamin C  1 tablet Oral Daily   chlorhexidine  15 mL Mouth Rinse BID   Chlorhexidine Gluconate Cloth  6 each Topical Q0600   dicyclomine  10 mg Oral TID AC   DULoxetine  20 mg Oral Daily   feeding supplement (NEPRO CARB STEADY)  237 mL Oral TID BM   feeding supplement (OSMOLITE 1.5 CAL)  975 mL Per Tube Q24H   feeding supplement (PROSource TF)  45 mL Per Tube BID   folic acid  2 mg Oral Daily   heparin sodium (porcine)  3,200 Units Intracatheter Once   insulin aspart  0-15 Units Subcutaneous TID WC   insulin aspart  0-5 Units Subcutaneous QHS   insulin detemir  8 Units Subcutaneous BID   lidocaine  2 patch Transdermal Q24H   megestrol  800 mg Oral Daily   mupirocin cream  1 application Topical BID   pantoprazole  40 mg Oral BID   senna  2 tablet Oral Daily    have reviewed scheduled and prn medications.  Physical Exam: General: Lying in bed, no distress Heart: Tachycardia Lungs:  Bilateral chest rise with no increased work of breathing Abdomen:soft, Non-tender, non-distended Extremities:No edema Dialysis Access: Right IJ TDC  Bernadene Garside J Rejina Odle 12/15/2021,8:41 AM  LOS: 11 days

## 2021-12-15 NOTE — Progress Notes (Signed)
CBG 576 this morning. MD Kirsteins notified. New order received. Waiting on lab collect for stat glucose. Tube feedings stopped for the morning. Continue to monitor

## 2021-12-15 NOTE — Progress Notes (Signed)
Inpatient Diabetes Program Recommendations  AACE/ADA: New Consensus Statement on Inpatient Glycemic Control (2015)  Target Ranges:  Prepandial:   less than 140 mg/dL      Peak postprandial:   less than 180 mg/dL (1-2 hours)      Critically ill patients:  140 - 180 mg/dL   Lab Results  Component Value Date   GLUCAP 437 (H) 12/15/2021   HGBA1C 9.0 (H) 10/28/2021    Review of Glycemic Control  Diabetes history: Type 2 DM Outpatient Diabetes medications: Levemir 4 units BID, Novolog 0-9 units Q4H Current orders for Inpatient glycemic control: Novolog 0-15 units TID & HS, Levemir 8 units BID Nocturnal TF @ 1800   Inpatient Diabetes Program Recommendations:    Glucose trends significantly elevated >500's mg/dL assuming due to nocturnal tube feeds.  Consider: -Changing correction to Novolog 0-6 units Q4H -Adding Novolog 2 units @ 2000, 0000, 0400 for tube feed coverage.   Thanks, Bronson Curb, MSN, RNC-OB Diabetes Coordinator [phone] (8a-5p)

## 2021-12-16 LAB — GLUCOSE, CAPILLARY
Glucose-Capillary: 520 mg/dL (ref 70–99)
Glucose-Capillary: 211 mg/dL — ABNORMAL HIGH (ref 70–99)
Glucose-Capillary: 188 mg/dL — ABNORMAL HIGH (ref 70–99)
Glucose-Capillary: 198 mg/dL — ABNORMAL HIGH (ref 70–99)
Glucose-Capillary: 176 mg/dL — ABNORMAL HIGH (ref 70–99)
Glucose-Capillary: 147 mg/dL — ABNORMAL HIGH (ref 70–99)

## 2021-12-16 LAB — GLUCOSE, RANDOM: Glucose, Bld: 595 mg/dL (ref 70–99)

## 2021-12-16 MED ORDER — INSULIN DETEMIR 100 UNIT/ML ~~LOC~~ SOLN
10.0000 [IU] | Freq: Two times a day (BID) | SUBCUTANEOUS | Status: DC
  Administered 2021-12-16 – 2021-12-18 (×4): 10 [IU] via SUBCUTANEOUS
  Filled 2021-12-16 (×5): qty 0.1

## 2021-12-16 MED ORDER — INSULIN ASPART 100 UNIT/ML IJ SOLN
0.0000 [IU] | INTRAMUSCULAR | Status: DC
  Administered 2021-12-16: 2 [IU] via SUBCUTANEOUS
  Administered 2021-12-16 (×2): 3 [IU] via SUBCUTANEOUS
  Administered 2021-12-17 (×4): 11 [IU] via SUBCUTANEOUS
  Administered 2021-12-18: 5 [IU] via SUBCUTANEOUS
  Administered 2021-12-18: 8 [IU] via SUBCUTANEOUS
  Administered 2021-12-18: 15 [IU] via SUBCUTANEOUS
  Administered 2021-12-18: 2 [IU] via SUBCUTANEOUS
  Administered 2021-12-18: 15 [IU] via SUBCUTANEOUS
  Administered 2021-12-18 (×2): 8 [IU] via SUBCUTANEOUS
  Administered 2021-12-19: 5 [IU] via SUBCUTANEOUS
  Administered 2021-12-19 (×2): 8 [IU] via SUBCUTANEOUS
  Administered 2021-12-20: 11 [IU] via SUBCUTANEOUS
  Administered 2021-12-20: 2 [IU] via SUBCUTANEOUS
  Administered 2021-12-20: 8 [IU] via SUBCUTANEOUS
  Administered 2021-12-20: 2 [IU] via SUBCUTANEOUS
  Administered 2021-12-20: 5 [IU] via SUBCUTANEOUS
  Administered 2021-12-21 (×3): 2 [IU] via SUBCUTANEOUS
  Administered 2021-12-22 (×3): 3 [IU] via SUBCUTANEOUS
  Administered 2021-12-22: 8 [IU] via SUBCUTANEOUS
  Administered 2021-12-22 – 2021-12-23 (×2): 5 [IU] via SUBCUTANEOUS
  Administered 2021-12-23 (×3): 3 [IU] via SUBCUTANEOUS
  Administered 2021-12-23: 2 [IU] via SUBCUTANEOUS
  Administered 2021-12-24: 5 [IU] via SUBCUTANEOUS
  Administered 2021-12-24 (×3): 3 [IU] via SUBCUTANEOUS
  Administered 2021-12-25: 2 [IU] via SUBCUTANEOUS
  Administered 2021-12-25: 3 [IU] via SUBCUTANEOUS
  Administered 2021-12-25: 2 [IU] via SUBCUTANEOUS
  Administered 2021-12-25: 3 [IU] via SUBCUTANEOUS
  Administered 2021-12-25: 5 [IU] via SUBCUTANEOUS
  Administered 2021-12-25: 2 [IU] via SUBCUTANEOUS
  Administered 2021-12-26 (×4): 3 [IU] via SUBCUTANEOUS
  Administered 2021-12-26: 2 [IU] via SUBCUTANEOUS
  Administered 2021-12-27: 5 [IU] via SUBCUTANEOUS
  Administered 2021-12-27 (×2): 2 [IU] via SUBCUTANEOUS
  Administered 2021-12-28 (×2): 5 [IU] via SUBCUTANEOUS
  Administered 2021-12-28: 3 [IU] via SUBCUTANEOUS
  Administered 2021-12-28: 8 [IU] via SUBCUTANEOUS
  Administered 2021-12-29 (×2): 3 [IU] via SUBCUTANEOUS
  Administered 2021-12-29: 2 [IU] via SUBCUTANEOUS
  Administered 2021-12-29 – 2021-12-30 (×2): 3 [IU] via SUBCUTANEOUS
  Administered 2021-12-30: 2 [IU] via SUBCUTANEOUS
  Administered 2021-12-30: 3 [IU] via SUBCUTANEOUS
  Administered 2021-12-30 – 2021-12-31 (×2): 2 [IU] via SUBCUTANEOUS
  Administered 2021-12-31: 3 [IU] via SUBCUTANEOUS
  Administered 2021-12-31 – 2022-01-01 (×2): 5 [IU] via SUBCUTANEOUS
  Administered 2022-01-01: 8 [IU] via SUBCUTANEOUS
  Administered 2022-01-01 (×2): 2 [IU] via SUBCUTANEOUS
  Administered 2022-01-01: 5 [IU] via SUBCUTANEOUS
  Administered 2022-01-02: 2 [IU] via SUBCUTANEOUS
  Administered 2022-01-02: 3 [IU] via SUBCUTANEOUS
  Administered 2022-01-02 – 2022-01-04 (×3): 5 [IU] via SUBCUTANEOUS
  Administered 2022-01-04: 2 [IU] via SUBCUTANEOUS

## 2021-12-16 NOTE — Progress Notes (Addendum)
[name] NEPHROLOGY PROGRESS NOTE  Assessment/ Plan:  #Acute kidney injury secondary due to ischemic ATN in the setting of cardiac arrest, shock and contrast.  She has underlying CKD with baseline creatinine level around 1.3 back in 2021.  She has been RRT dependent since 10/31/2021 which was discussed with the patient and family.  Now receiving HD per MWF schedule.  See already has a spot at [facility] TTS schedule second shift for AKI.   Continue dialysis on MWF schedule for now -okay for lab draws with dialysis from nephrology perspective  #V-fib cardiac arrest outside the hospital due to massive PE.  #Massive PE status post mechanical thrombectomy on 1/2. Vasc US- no DVT, liver mass noted 10/28/21 CT abd/ pelvis, s/p MRI abd--> incompletely characterized, had MRI with gadolinium 1/20  to reevaluate -- not definitive. S/p hem/onc eval.  # Retroperitoneal bleed/Hemorrhagic shock/Acute blood loss anemia originating from left liver lobe s/p embolization 1/4--> required FFP, plts, Vit K, pRBCs; now stable. Hematology following - no ESA due to concern for her recent thromboembolic disease. Transfuse when needed   # Thrombocytopenia- suspected ITP, s/p Nplate PRN and dex. -  work up and mgmt per heme.  Overall much improved  # Acute hypoxic RF- extubated 11/07/21, now much improved  #Hyperglycemia/DM2: insulin management per primary team  #Encephalopathy: Likely multifactorial with delirium being the main contributor.  Management per primary team  #Leukocytosis: On antibiotics with cefepime.  Management per primary team  Subjective: Patient has no complaints today other than frequent lab draws  Objective Vital signs in last 24 hours: Vitals:   12/15/21 1314 12/15/21 1927 12/16/21 0307 12/16/21 0445  BP: 122/72 (!) 112/53 116/60   Pulse: 99 96 (!) 101   Resp: 18 18 18    Temp: 98.5 F (36.9 C) 98.5 F (36.9 C) 99.4 F (37.4 C)   TempSrc: Oral Oral Oral   SpO2: 100% 98% 98%    Weight:    67.6 kg  Height:       Weight change: 1.6 kg  Intake/Output Summary (Last 24 hours) at 12/16/2021 0933 Last data filed at 12/15/2021 1624 Gross per 24 hour  Intake 358 ml  Output --  Net 358 ml       Labs: Basic Metabolic Panel: Recent Labs  Lab 12/12/21 1216 12/14/21 0628 12/15/21 0641 12/16/21 0709  NA 136 129* 128*  --   K 4.0 4.6 4.7  --   CL 100 94* 93*  --   CO2 26 24 26   --   GLUCOSE 172* 594* 603* 595*  BUN 26* 32* 28*  --   CREATININE 3.36* 3.30* 2.45*  --   CALCIUM 9.2 9.1 8.5*  --   PHOS 3.4 2.8 2.6  --    Liver Function Tests: Recent Labs  Lab 12/12/21 1216 12/14/21 0628 12/15/21 0641  ALBUMIN 2.9* 3.1* 2.7*   No results for input(s): LIPASE, AMYLASE in the last 168 hours. No results for input(s): AMMONIA in the last 168 hours. CBC: Recent Labs  Lab 12/10/21 0516 12/12/21 1216 12/13/21 0541 12/14/21 0628 12/15/21 0641  WBC 12.5* 15.1* 14.9* 17.1* 13.2*  NEUTROABS  --   --  10.9* 14.5* 9.8*  HGB 8.2* 8.4* 8.6* 9.4* 7.7*  HCT 25.4* 26.3* 27.2* 29.0* 24.4*  MCV 90.1 89.5 89.8 88.7 90.0  PLT 83* 123* 139* 169 169   Cardiac Enzymes: No results for input(s): CKTOTAL, CKMB, CKMBINDEX, TROPONINI in the last 168 hours. CBG: Recent Labs  Lab 12/15/21 0930  12/15/21 1159 12/15/21 1734 12/15/21 2111 12/16/21 0622  GLUCAP 437* 216* 102* 270* 520*    Iron Studies: No results for input(s): IRON, TIBC, TRANSFERRIN, FERRITIN in the last 72 hours. Studies/Results: CT ABDOMEN PELVIS WO CONTRAST  Result Date: 12/15/2021 CLINICAL DATA:  Follow-up retroperitoneal hematoma EXAM: CT ABDOMEN AND PELVIS WITHOUT CONTRAST TECHNIQUE: Multidetector CT imaging of the abdomen and pelvis was performed following the standard protocol without IV contrast. RADIATION DOSE REDUCTION: This exam was performed according to the departmental dose-optimization program which includes automated exposure control, adjustment of the mA and/or kV according to patient  size and/or use of iterative reconstruction technique. COMPARISON:  MR abdomen, 11/16/2021, CT abdomen pelvis, 10/30/2021 FINDINGS: Lower chest: Bandlike scarring and or atelectasis of the dependent bilateral lung bases, significantly improved compared to prior examination. Hepatobiliary: Redemonstrated large subcapsular lesion of the inferior left lobe of the liver, hepatic segment III, measuring 8.9 x 6.0 cm, not significantly changed. Lesion is now near fluid internal attenuation, HU = 24). No radiopaque gallstones, gallbladder wall thickening, or biliary dilatation. Pancreas: Unremarkable. No pancreatic ductal dilatation or surrounding inflammatory changes. Spleen: Normal in size without significant abnormality. Adrenals/Urinary Tract: Adrenal glands are unremarkable. Kidneys are normal, without renal calculi, solid lesion, or hydronephrosis. Bladder is unremarkable. Stomach/Bowel: Stomach is within normal limits. Enteric feeding tube with tip and side port below the diaphragm. Appendix appears normal. No evidence of bowel wall thickening, distention, or inflammatory changes. Pancolonic diverticulosis. Vascular/Lymphatic: Aortic atherosclerosis. No enlarged abdominal or pelvic lymph nodes. Reproductive: No mass or other significant abnormality. Other: No abdominal wall hernia or abnormality. No ascites. Musculoskeletal: No acute or significant osseous findings. IMPRESSION: 1. Redemonstrated large subcapsular lesion of the inferior left lobe of the liver, hepatic segment III, measuring 8.9 x 6.0 cm, not significantly changed. Lesion is now near fluid internal attenuation, generally consistent with a resolving hematoma and previously embolized. As on prior examination, underlying liver lesion, such as hemangioma or malignancy complicated by hemorrhage is not excluded, particularly by this noncontrast examination. 2. No evidence of retroperitoneal hematoma per ordering indication. 3. Bandlike scarring and or  atelectasis of the dependent bilateral lung bases, significantly improved compared to prior examination. 4. Pancolonic diverticulosis without evidence of acute diverticulitis. Aortic Atherosclerosis (ICD10-I70.0). Electronically Signed   By: Delanna Ahmadi M.D.   On: 12/15/2021 11:40   CT HEAD WO CONTRAST (5MM)  Result Date: 12/14/2021 CLINICAL DATA:  Altered mental status EXAM: CT HEAD WITHOUT CONTRAST TECHNIQUE: Contiguous axial images were obtained from the base of the skull through the vertex without intravenous contrast. RADIATION DOSE REDUCTION: This exam was performed according to the departmental dose-optimization program which includes automated exposure control, adjustment of the mA and/or kV according to patient size and/or use of iterative reconstruction technique. COMPARISON:  12/06/2021 FINDINGS: Brain: The brainstem, cerebellum, cerebral peduncles, thalami, basal ganglia, basilar cisterns, and ventricular system appear within normal limits. No intracranial hemorrhage, mass lesion, or acute CVA. Vascular: There is atherosclerotic calcification of the cavernous carotid arteries bilaterally. Skull: Unremarkable Sinuses/Orbits: Unremarkable Other: Stable cutaneous thickening and subcutaneous stranding along the left posterior occipitoparietal scalp, present on 12/06/2021 but not on 11/08/2021, probably a scalp hematoma or contusion. IMPRESSION: 1. No acute intracranial findings. 2. Stable focal scalp lesion along the left posterior occipitoparietal region, possibly a scalp hematoma or contusion. 3. Atherosclerosis. Electronically Signed   By: Van Clines M.D.   On: 12/14/2021 13:07    Medications: Infusions:  sodium chloride Stopped (12/13/21 2249)   ceFEPime (MAXIPIME) IV 1  g (12/15/21 1819)    Scheduled Medications:  ALPRAZolam  0.25 mg Oral Daily   amLODipine  10 mg Oral Q2000   apixaban  5 mg Oral BID   atorvastatin  20 mg Oral Daily   B-complex with vitamin C  1 tablet Oral  Daily   chlorhexidine  15 mL Mouth Rinse BID   Chlorhexidine Gluconate Cloth  6 each Topical Q0600   dicyclomine  10 mg Oral TID AC   DULoxetine  20 mg Oral Daily   feeding supplement (NEPRO CARB STEADY)  237 mL Oral TID BM   feeding supplement (OSMOLITE 1.5 CAL)  975 mL Per Tube Q24H   feeding supplement (PROSource TF)  45 mL Per Tube BID   folic acid  2 mg Oral Daily   heparin sodium (porcine)  3,200 Units Intracatheter Once   insulin aspart  0-15 Units Subcutaneous TID WC   insulin aspart  0-5 Units Subcutaneous QHS   insulin detemir  10 Units Subcutaneous BID   lidocaine  2 patch Transdermal Q24H   megestrol  800 mg Oral Daily   mupirocin cream  1 application Topical BID   pantoprazole  40 mg Oral BID   senna  2 tablet Oral Daily    have reviewed scheduled and prn medications.  Physical Exam: General: Lying in bed, no distress Heart: Normal rate Lungs: Bilateral chest rise with no increased work of breathing Abdomen:soft, Non-tender, non-distended Extremities:No edema Dialysis Access: Right IJ TDC  [name] 12/16/2021,9:33 AM  LOS: 12 days

## 2021-12-16 NOTE — Progress Notes (Signed)
Physical Therapy Session Note  Patient Details  Name: [name] MRN: [id number] Date of Birth: 01-08-1951  Today's Date: 12/16/2021 PT Individual Time: 1100-1208 PT Individual Time Calculation (min): 68 min   Short Term Goals: Week 2:  PT Short Term Goal 1 (Week 2): =LTG due to ELOS  Skilled Therapeutic Interventions/Progress Updates:    Pt received seated in recliner in room, agreeable to PT session. Pt reports some soreness in her LE this AM, not rated and declines intervention. Pt agreeable to get up to w/c and go to therapy gym. Sit to stand with max x 2 to stedy from recliner. Stedy transfer to w/c, mod A to stand from perched position on stedy seat. Manual w/c propulsion x 25 ft with use of BUE at min A level with max cueing for sequencing. Sit to stand in // bars with mod A with significantly increased time needed due to poor motor planning. Once in standing pt able to take a few steps forwards/backwards with min to mod A for balance in // bars. Pt needs encouragement to continue to performing standing in // bars this session. Pt also reports being "surprised" at her physical abilities this session. Pt remains very limited by cognitive deficits and needing increased time for motor planning. Pt request to return to recliner at end of session, needs increased time and encouragement to stand to stedy with +2 from w/c seat. Stedy transfer to recliner. Pt left seated in recliner in room with needs in reach, quick release belt and chair alarm in place, daughter present.  Therapy Documentation Precautions:  Precautions Precautions: Fall, Other (comment) Precaution Comments: cortrak; right leg discomfort - PTA Restrictions Weight Bearing Restrictions: No     Therapy/Group: Individual Therapy   Excell Seltzer, PT, DPT, CSRS  12/16/2021, 12:24 PM

## 2021-12-16 NOTE — Progress Notes (Signed)
Held lunch time sliding scale insulin due to poor PO intake. Dr. Letta Pate notified.

## 2021-12-16 NOTE — Progress Notes (Signed)
6916 Blood Glucose 595. Rechecked CBG at 1055: 211. Patient with poor PO intake. Encouraging protein shakes and fluids.

## 2021-12-16 NOTE — Progress Notes (Signed)
Patient CBG this morning  is 520. Tube feeding stopped. MD Kirstein notified. Order received to administer 15U insulin. Oncoming shift noted to recheck CBG in 1 hour.

## 2021-12-16 NOTE — Progress Notes (Signed)
Patient returned from dialysis towards the end of shift. Patient's daughter was initially apprehensive due to dialysis treatment taking longer than usual in her opinion. Upon return, patient's daughter noticed redness on the right side of patient's face. Patient's daughter was very upset by this. From my observation, patient frequently sleeps on the right side of her face leaning on her hand in the same spot where the redness was found which is probably what caused the redness. Patient is also confused and restless. Passed on to night shift nurse.   Gladstone Lighter, LPN

## 2021-12-16 NOTE — Progress Notes (Signed)
PROGRESS NOTE   Subjective/Complaints: Discussed results of CT abdomen.  Also discussed need to start taking more p.o. so that tube feed can be reduced.  Discussed diabetes which has been difficult to manage.  ROS: Limited by cognition/sedation   Objective:   CT ABDOMEN PELVIS WO CONTRAST  Result Date: 12/15/2021 CLINICAL DATA:  Follow-up retroperitoneal hematoma EXAM: CT ABDOMEN AND PELVIS WITHOUT CONTRAST TECHNIQUE: Multidetector CT imaging of the abdomen and pelvis was performed following the standard protocol without IV contrast. RADIATION DOSE REDUCTION: This exam was performed according to the departmental dose-optimization program which includes automated exposure control, adjustment of the mA and/or kV according to patient size and/or use of iterative reconstruction technique. COMPARISON:  MR abdomen, 11/16/2021, CT abdomen pelvis, 10/30/2021 FINDINGS: Lower chest: Bandlike scarring and or atelectasis of the dependent bilateral lung bases, significantly improved compared to prior examination. Hepatobiliary: Redemonstrated large subcapsular lesion of the inferior left lobe of the liver, hepatic segment III, measuring 8.9 x 6.0 cm, not significantly changed. Lesion is now near fluid internal attenuation, HU = 24). No radiopaque gallstones, gallbladder wall thickening, or biliary dilatation. Pancreas: Unremarkable. No pancreatic ductal dilatation or surrounding inflammatory changes. Spleen: Normal in size without significant abnormality. Adrenals/Urinary Tract: Adrenal glands are unremarkable. Kidneys are normal, without renal calculi, solid lesion, or hydronephrosis. Bladder is unremarkable. Stomach/Bowel: Stomach is within normal limits. Enteric feeding tube with tip and side port below the diaphragm. Appendix appears normal. No evidence of bowel wall thickening, distention, or inflammatory changes. Pancolonic diverticulosis.  Vascular/Lymphatic: Aortic atherosclerosis. No enlarged abdominal or pelvic lymph nodes. Reproductive: No mass or other significant abnormality. Other: No abdominal wall hernia or abnormality. No ascites. Musculoskeletal: No acute or significant osseous findings. IMPRESSION: 1. Redemonstrated large subcapsular lesion of the inferior left lobe of the liver, hepatic segment III, measuring 8.9 x 6.0 cm, not significantly changed. Lesion is now near fluid internal attenuation, generally consistent with a resolving hematoma and previously embolized. As on prior examination, underlying liver lesion, such as hemangioma or malignancy complicated by hemorrhage is not excluded, particularly by this noncontrast examination. 2. No evidence of retroperitoneal hematoma per ordering indication. 3. Bandlike scarring and or atelectasis of the dependent bilateral lung bases, significantly improved compared to prior examination. 4. Pancolonic diverticulosis without evidence of acute diverticulitis. Aortic Atherosclerosis (ICD10-I70.0). Electronically Signed   By: Delanna Ahmadi M.D.   On: 12/15/2021 11:40   CT HEAD WO CONTRAST (5MM)  Result Date: 12/14/2021 CLINICAL DATA:  Altered mental status EXAM: CT HEAD WITHOUT CONTRAST TECHNIQUE: Contiguous axial images were obtained from the base of the skull through the vertex without intravenous contrast. RADIATION DOSE REDUCTION: This exam was performed according to the departmental dose-optimization program which includes automated exposure control, adjustment of the mA and/or kV according to patient size and/or use of iterative reconstruction technique. COMPARISON:  12/06/2021 FINDINGS: Brain: The brainstem, cerebellum, cerebral peduncles, thalami, basal ganglia, basilar cisterns, and ventricular system appear within normal limits. No intracranial hemorrhage, mass lesion, or acute CVA. Vascular: There is atherosclerotic calcification of the cavernous carotid arteries bilaterally. Skull:  Unremarkable Sinuses/Orbits: Unremarkable Other: Stable cutaneous thickening and subcutaneous stranding along the left posterior occipitoparietal scalp, present on 12/06/2021  but not on 11/08/2021, probably a scalp hematoma or contusion. IMPRESSION: 1. No acute intracranial findings. 2. Stable focal scalp lesion along the left posterior occipitoparietal region, possibly a scalp hematoma or contusion. 3. Atherosclerosis. Electronically Signed   By: Van Clines M.D.   On: 12/14/2021 13:07   Recent Labs    12/14/21 0628 12/15/21 0641  WBC 17.1* 13.2*  HGB 9.4* 7.7*  HCT 29.0* 24.4*  PLT 169 169    Recent Labs    12/14/21 0628 12/15/21 0641 12/16/21 0709  NA 129* 128*  --   K 4.6 4.7  --   CL 94* 93*  --   CO2 24 26  --   GLUCOSE 594* 603* 595*  BUN 32* 28*  --   CREATININE 3.30* 2.45*  --   CALCIUM 9.1 8.5*  --      Intake/Output Summary (Last 24 hours) at 12/16/2021 1218 Last data filed at 12/16/2021 8338 Gross per 24 hour  Intake 558 ml  Output --  Net 558 ml      Pressure Injury 12/04/21 Head Posterior Unstageable - Full thickness tissue loss in which the base of the injury is covered by slough (yellow, tan, gray, green or brown) and/or eschar (tan, brown or black) in the wound bed. 3 cm round, scabbed area to po (Active)  12/04/21 2000  Location: Head  Location Orientation: Posterior  Staging: Unstageable - Full thickness tissue loss in which the base of the injury is covered by slough (yellow, tan, gray, green or brown) and/or eschar (tan, brown or black) in the wound bed.  Wound Description (Comments): 3 cm round, scabbed area to posterior head.  Present on Admission: Yes    Physical Exam: Vital Signs Blood pressure 116/60, pulse (!) 101, temperature 99.4 F (37.4 C), temperature source Oral, resp. rate 18, height 5\' 5"  (1.651 m), weight 67.6 kg, SpO2 98 %.  General: No acute distress Mood and affect are appropriate Heart: Regular rate and rhythm no rubs  murmurs or extra sounds Lungs: Clear to auscultation, breathing unlabored, no rales or wheezes Abdomen: Positive bowel sounds, soft nontender to palpation, nondistended Extremities: No clubbing, cyanosis, or edema Skin: No evidence of breakdown, no evidence of rash  Psychiatric:asleep Neurological: asleep Skin: unstageable wound with scabbing on back of head 3-4cm in diameter- also has popped/dry blister on L sternum- slough gone from base- looks better- both look the same today Genitourinary:    Comments: Foley tube in place- medium clear, yellow urine in bag- no change Musculoskeletal:     Cervical back: Neck supple. No tenderness.     Comments: UE 5-/5 B/L  LE 3/5 HF/KE- DF 4-/5 on L and 2-/5 on R; PF 4-/5 B/L  R foot drop- at rest in PF   Skin:    General: Skin is warm and dry. Wound on occiput/matted with hair    Comments: R chest HD catheter  Neuro: Mild tremors with movement. Reasonable sitting balance Numb/absent/decreased sensation from mid calf to toes B/L L>R Vague- Ox2 with cues   Assessment/Plan: 1. Functional deficits which require 3+ hours per day of interdisciplinary therapy in a comprehensive inpatient rehab setting. Physiatrist is providing close team supervision and 24 hour management of active medical problems listed below. Physiatrist and rehab team continue to assess barriers to discharge/monitor patient progress toward functional and medical goals  Care Tool:  Bathing    Body parts bathed by patient: Right arm, Left arm, Chest, Right upper leg, Left upper leg, Face,  Front perineal area, Abdomen, Buttocks   Body parts bathed by helper: Front perineal area, Buttocks, Right lower leg, Left lower leg     Bathing assist Assist Level: Minimal Assistance - Patient > 75%     Upper Body Dressing/Undressing Upper body dressing   What is the patient wearing?: Pull over shirt    Upper body assist Assist Level: Minimal Assistance - Patient > 75%    Lower Body  Dressing/Undressing Lower body dressing      What is the patient wearing?: Pants     Lower body assist Assist for lower body dressing: Moderate Assistance - Patient 50 - 74%     Toileting Toileting Toileting Activity did not occur Landscape architect and hygiene only): N/A (no void or bm)  Toileting assist Assist for toileting: Maximal Assistance - Patient 25 - 49%     Transfers Chair/bed transfer  Transfers assist     Chair/bed transfer assist level: Dependent - mechanical lift (stedy)     Locomotion Ambulation   Ambulation assist   Ambulation activity did not occur: Safety/medical concerns          Walk 10 feet activity   Assist  Walk 10 feet activity did not occur: Safety/medical concerns        Walk 50 feet activity   Assist Walk 50 feet with 2 turns activity did not occur: Safety/medical concerns         Walk 150 feet activity   Assist Walk 150 feet activity did not occur: Safety/medical concerns         Walk 10 feet on uneven surface  activity   Assist Walk 10 feet on uneven surfaces activity did not occur: Safety/medical concerns         Wheelchair     Assist Is the patient using a wheelchair?: Yes Type of Wheelchair: Manual    Wheelchair assist level: Minimal Assistance - Patient > 75% Max wheelchair distance: 100'    Wheelchair 50 feet with 2 turns activity    Assist        Assist Level: Minimal Assistance - Patient > 75%   Wheelchair 150 feet activity     Assist      Assist Level: Dependent - Patient 0%   Blood pressure 116/60, pulse (!) 101, temperature 99.4 F (37.4 C), temperature source Oral, resp. rate 18, height 5\' 5"  (1.651 m), weight 67.6 kg, SpO2 98 %.  Medical Problem List and Plan: 1. Functional deficits secondary to debility related to VF cardiac arrest secondary to massive pulmonary emboli status post mechanical thrombectomy             -patient may not shower due to HD catheter-  unless it's removed             -ELOS/Goals: 10-14 days supervision to min A for PT/OT SLP  -con't CIR- Continue CIR- PT, OT and SLP  Continue CIR- PT, OT and SLP Family conference today at 11am- to discuss pt's care- and dispo  2/17- looking to see if can do SNF after medical more stable- con't CIR 2.  Antithrombotics: -DVT/anticoagulation:  Pharmaceutical: Other (comment) Eliquis             -antiplatelet therapy: N/A 3. Pain Management: Neurontin 100 mg twice daily, oxycodone as needed  2/9- had been on gabapentin for nerve pain-will stop due to sedation- will start RLE lidoderm patch 2 patches for pain.   2/13  more alert off gabapentin in spite of being on at  home prior for neuropathy 4. Mood anxiety-  Cymbalta 20 mg daily, Xanax 0.25 mg daily as needed provide emotional support             -antipsychotic agents: N/A 5. Neuropsych: This patient isnot capable of making decisions on her own behalf. 6. Skin/Wound Care: Routine skin checks 7. Fluids/Electrolytes/Nutrition: still not eating much  -continue TF for now.   -hopefully po intake will pick up as mentation improves.  8.  Pericasular hepatic /hemorrhagic shock/acute blood loss anemia/status post left liver lobe embolization  Hgb down again to 7.7 repeat non contrast CT abd no change in pericapsular hepatic bleed , check stool OB 9.  AKI secondary to ATN.  Follow-up per renal services.  Started clip for AKI- not ESRD, however Cr almost 5 today- getting HD tomorrow 2/8.   2/9- last Cr 5.74- up from 4.97 up from 4.45- rising- per renal- likely HD tomorrow?.   2/16- Cr 3.36 and BUN 26- per renal  10.  Thrombocytopenia.  Suspect ITP.  Follow-up per hematology  2/13- plts 83k. Continue to monitor (has ranged between 60k and 110k)  2/16- Plts up to 139k 11.  Hyperlipidemia.  Lipitor 12. Diabetes- Poor appetite- has Cortrak for intake due to poor appetite-stop insulin- was on at home- levemir 4 units BID when came to rehab, but dropping  CBGs- so will hold- will do CBGs BID Will assess if still needs- d/c insulin due to Tfs  2/11- TF's restarted so CBGs going up- 486 this AM- restarted Levemir 4 units BID and SSI- will titrate as required.    CBG (last 3)  Recent Labs    12/15/21 2111 12/16/21 0622 12/16/21 1059  GLUCAP 270* 520* 211*   Increased Levimir to 10U q12h  Changed to moderate SSI q 4h since oral intake is low  13. New onset confusion/poor memory- anoxia? From prolonged CPR  2/9- will check CT of head- and stop Gabapentin for nerve pain due to increased sedation- had before but so out of it per daughter- much worse last 2 days.   2/15- variable -tangential and  poor memory- will need to d/w daughter 26. ABLA with chronic anemia  2/13 hb stable at 8.2 15. Scab on back of head-pressure ulcer unstageable/Blister on sternum   2/9- on the back of her head  2/10- will need to have hair washed to see if we can see wound that's on CT  2/13 Rose RN examined wound today --peroxide and mupirocin bid with dry dressing  2/15- touched base with nursing- stable- unstageable on back of head- blister healing 16. HTN with hx of VF arrest  2/10- pt's BP 178/95- running 409W/119J- systolic- will d/w renal since not on any BP meds-   2/11- Started norvasc and BP looks better 478 systolic- will con't to monitor trend.    Vitals:   12/15/21 1927 12/16/21 0307  BP: (!) 112/53 116/60  Pulse: 96 (!) 101  Resp: 18 18  Temp: 98.5 F (36.9 C) 99.4 F (37.4 C)  SpO2: 98% 98%    17. Constipation  2/14- will give sorbitol- 30 cc after therapy- 2pm  2/17- LBM overnight 18. Leukocytosis-UTI_ Cx pending  2/15- will remove foley and bladder scan; also check U/A and Cx- foley was per renal .   2/17- pt has UTI- started on Cefepime yesterday- also WBC up to 17k  2/18 WBC down to 13.2K , recheck in am  19. Fall  2/16- pt on floor last night- denied pain-  CT head was ordered - pt refused to keep on mask to go downstairs- will get this  AM.  20. Poor appetite-  2/17- will get PEG placed next week so can remove Cortrak- spoke with PA to get ordered      LOS: 12 days A FACE TO [place] E Aerika Groll 12/16/2021, 12:18 PM

## 2021-12-17 LAB — COMPREHENSIVE METABOLIC PANEL
ALT: 37 U/L (ref 0–44)
AST: 33 U/L (ref 15–41)
Albumin: 2.6 g/dL — ABNORMAL LOW (ref 3.5–5.0)
Alkaline Phosphatase: 121 U/L (ref 38–126)
Anion gap: 12 (ref 5–15)
BUN: 62 mg/dL — ABNORMAL HIGH (ref 8–23)
CO2: 24 mmol/L (ref 22–32)
Calcium: 9.2 mg/dL (ref 8.9–10.3)
Chloride: 95 mmol/L — ABNORMAL LOW (ref 98–111)
Creatinine, Ser: 3.81 mg/dL — ABNORMAL HIGH (ref 0.44–1.00)
GFR, Estimated: 12 mL/min — ABNORMAL LOW (ref >60)
Glucose, Bld: 356 mg/dL — ABNORMAL HIGH (ref 70–99)
Potassium: 4.6 mmol/L (ref 3.5–5.1)
Sodium: 131 mmol/L — ABNORMAL LOW (ref 135–145)
Total Bilirubin: 0.4 mg/dL (ref 0.3–1.2)
Total Protein: 6.7 g/dL (ref 6.5–8.1)

## 2021-12-17 LAB — GLUCOSE, CAPILLARY
Glucose-Capillary: 149 mg/dL — ABNORMAL HIGH (ref 70–99)
Glucose-Capillary: 192 mg/dL — ABNORMAL HIGH (ref 70–99)
Glucose-Capillary: 311 mg/dL — ABNORMAL HIGH (ref 70–99)
Glucose-Capillary: 318 mg/dL — ABNORMAL HIGH (ref 70–99)
Glucose-Capillary: 327 mg/dL — ABNORMAL HIGH (ref 70–99)
Glucose-Capillary: 327 mg/dL — ABNORMAL HIGH (ref 70–99)
Glucose-Capillary: 65 mg/dL — ABNORMAL LOW (ref 70–99)

## 2021-12-17 LAB — CBC WITH DIFFERENTIAL/PLATELET
Abs Immature Granulocytes: 0.12 10*3/uL — ABNORMAL HIGH (ref 0.00–0.07)
Basophils Absolute: 0.1 10*3/uL (ref 0.0–0.1)
Basophils Relative: 0 %
Eosinophils Absolute: 0.1 10*3/uL (ref 0.0–0.5)
Eosinophils Relative: 0 %
HCT: 26.4 % — ABNORMAL LOW (ref 36.0–46.0)
Hemoglobin: 8.7 g/dL — ABNORMAL LOW (ref 12.0–15.0)
Immature Granulocytes: 1 %
Lymphocytes Relative: 7 %
Lymphs Abs: 1.3 10*3/uL (ref 0.7–4.0)
MCH: 29.1 pg (ref 26.0–34.0)
MCHC: 33 g/dL (ref 30.0–36.0)
MCV: 88.3 fL (ref 80.0–100.0)
Monocytes Absolute: 1.1 10*3/uL — ABNORMAL HIGH (ref 0.1–1.0)
Monocytes Relative: 6 %
Neutro Abs: 15.7 10*3/uL — ABNORMAL HIGH (ref 1.7–7.7)
Neutrophils Relative %: 86 %
Platelets: 214 10*3/uL (ref 150–400)
RBC: 2.99 MIL/uL — ABNORMAL LOW (ref 3.87–5.11)
RDW: 15.2 % (ref 11.5–15.5)
WBC: 18.3 10*3/uL — ABNORMAL HIGH (ref 4.0–10.5)
nRBC: 0 % (ref 0.0–0.2)

## 2021-12-17 LAB — OCCULT BLOOD X 1 CARD TO LAB, STOOL: Fecal Occult Bld: NEGATIVE

## 2021-12-17 MED ORDER — VANCOMYCIN HCL 750 MG/150ML IV SOLN: 750.0000 mg | INTRAVENOUS | Status: DC

## 2021-12-17 MED ORDER — SODIUM CHLORIDE 0.9 % IV SOLN: 100.0000 mL | INTRAVENOUS | Status: DC | PRN

## 2021-12-17 MED ORDER — ALTEPLASE 2 MG IJ SOLR: 2.0000 mg | Freq: Once | INTRAMUSCULAR | Status: DC | PRN

## 2021-12-17 MED ORDER — VANCOMYCIN HCL 1500 MG/300ML IV SOLN
1500.0000 mg | Freq: Once | INTRAVENOUS | Status: AC
  Administered 2021-12-17: 1500 mg via INTRAVENOUS
  Filled 2021-12-17 (×2): qty 300

## 2021-12-17 MED ORDER — LIDOCAINE HCL (PF) 1 % IJ SOLN: 5.0000 mL | INTRAMUSCULAR | Status: DC | PRN

## 2021-12-17 MED ORDER — HEPARIN SODIUM (PORCINE) 1000 UNIT/ML IJ SOLN
INTRAMUSCULAR | Status: AC
  Filled 2021-12-17: qty 3

## 2021-12-17 MED ORDER — LIDOCAINE-PRILOCAINE 2.5-2.5 % EX CREA: 1.0000 "application " | TOPICAL_CREAM | CUTANEOUS | Status: DC | PRN

## 2021-12-17 MED ORDER — HEPARIN SODIUM (PORCINE) 1000 UNIT/ML DIALYSIS: 1000.0000 [IU] | INTRAMUSCULAR | Status: DC | PRN

## 2021-12-17 MED ORDER — PENTAFLUOROPROP-TETRAFLUOROETH EX AERO: 1.0000 "application " | INHALATION_SPRAY | CUTANEOUS | Status: DC | PRN

## 2021-12-17 MED ORDER — SODIUM CHLORIDE 0.9 % IV SOLN
2.0000 g | INTRAVENOUS | Status: DC
  Administered 2021-12-17: 2 g via INTRAVENOUS
  Filled 2021-12-17 (×2): qty 20

## 2021-12-17 NOTE — Progress Notes (Signed)
PROGRESS NOTE   Subjective/Complaints:  Pt got very upset about idea of PEG- But very confused- wasn't able to verbalize her issues/concerns.  Per nursing, CBGs are mid 300s- levemir increased to 10 units BID yesterday- Said her R foot is hurting.  LBM yesterday per nurse- pt couldn't tell me.  WBC up to 18k- spoke with ID and pharmacy- need to start Vanc and change cefepime to rocephin since covers UTI the same, however less chance of encephalopathy.    ROS: Limited by sedation/cognition   Objective:   No results found. Recent Labs    12/15/21 0641 12/17/21 0531  WBC 13.2* 18.3*  HGB 7.7* 8.7*  HCT 24.4* 26.4*  PLT 169 214   Recent Labs    12/15/21 0641 12/16/21 0709 12/17/21 0531  NA 128*  --  131*  K 4.7  --  4.6  CL 93*  --  95*  CO2 26  --  24  GLUCOSE 603* 595* 356*  BUN 28*  --  62*  CREATININE 2.45*  --  3.81*  CALCIUM 8.5*  --  9.2    Intake/Output Summary (Last 24 hours) at 12/17/2021 1748 Last data filed at 12/17/2021 1720 Gross per 24 hour  Intake 200 ml  Output 900 ml  Net -700 ml     Pressure Injury 12/04/21 Head Posterior Unstageable - Full thickness tissue loss in which the base of the injury is covered by slough (yellow, tan, gray, green or brown) and/or eschar (tan, brown or black) in the wound bed. 3 cm round, scabbed area to po (Active)  12/04/21 2000  Location: Head  Location Orientation: Posterior  Staging: Unstageable - Full thickness tissue loss in which the base of the injury is covered by slough (yellow, tan, gray, green or brown) and/or eschar (tan, brown or black) in the wound bed.  Wound Description (Comments): 3 cm round, scabbed area to posterior head.  Present on Admission: Yes    Physical Exam: Vital Signs Blood pressure 130/60, pulse (!) 106, temperature 98 F (36.7 C), temperature source Oral, resp. rate 18, height 5\' 5"  (1.651 m), weight 64.6 kg, SpO2 97  %.   General: awake, alert, confused; NAD HENT: conjugate gaze; oropharynx dry and lips chapped; cortrak in place CV: tachycardic rate; no JVD Pulmonary: CTA B/L; no W/R/R- good air movement GI: soft, NT, ND, (+)BS- normoactive BS Psychiatric: mildly agitated, but also sleepy Neurological: confused- Ox1- perseverating on PEG that she overhead Psychiatric:asleep Neurological: asleep Skin: unstageable wound with scabbing on back of head 3-4cm in diameter- also has popped/dry blister on L sternum- slough gone from base- looks better- both look the same today Genitourinary:    Comments: Foley tube in place- medium clear, yellow urine in bag- no change Musculoskeletal:     Cervical back: Neck supple. No tenderness.     Comments: UE 5-/5 B/L  LE 3/5 HF/KE- DF 4-/5 on L and 2-/5 on R; PF 4-/5 B/L  R foot drop- at rest in PF   Skin:    General: Skin is warm and dry. Wound on occiput/matted with hair    Comments: R chest HD catheter  Neuro: Mild tremors with movement.  Reasonable sitting balance Numb/absent/decreased sensation from mid calf to toes B/L L>R Vague- Ox2 with cues   Assessment/Plan: 1. Functional deficits which require 3+ hours per day of interdisciplinary therapy in a comprehensive inpatient rehab setting. Physiatrist is providing close team supervision and 24 hour management of active medical problems listed below. Physiatrist and rehab team continue to assess barriers to discharge/monitor patient progress toward functional and medical goals  Care Tool:  Bathing    Body parts bathed by patient: Right arm, Left arm, Chest, Right upper leg, Left upper leg, Face, Front perineal area, Abdomen, Buttocks   Body parts bathed by helper: Front perineal area, Buttocks, Right lower leg, Left lower leg     Bathing assist Assist Level: Minimal Assistance - Patient > 75%     Upper Body Dressing/Undressing Upper body dressing   What is the patient wearing?: Pull over shirt     Upper body assist Assist Level: Minimal Assistance - Patient > 75%    Lower Body Dressing/Undressing Lower body dressing      What is the patient wearing?: Pants     Lower body assist Assist for lower body dressing: Moderate Assistance - Patient 50 - 74%     Toileting Toileting Toileting Activity did not occur Landscape architect and hygiene only): N/A (no void or bm)  Toileting assist Assist for toileting: Maximal Assistance - Patient 25 - 49%     Transfers Chair/bed transfer  Transfers assist     Chair/bed transfer assist level: Dependent - mechanical lift (stedy)     Locomotion Ambulation   Ambulation assist   Ambulation activity did not occur: Safety/medical concerns          Walk 10 feet activity   Assist  Walk 10 feet activity did not occur: Safety/medical concerns        Walk 50 feet activity   Assist Walk 50 feet with 2 turns activity did not occur: Safety/medical concerns         Walk 150 feet activity   Assist Walk 150 feet activity did not occur: Safety/medical concerns         Walk 10 feet on uneven surface  activity   Assist Walk 10 feet on uneven surfaces activity did not occur: Safety/medical concerns         Wheelchair     Assist Is the patient using a wheelchair?: Yes Type of Wheelchair: Manual    Wheelchair assist level: Minimal Assistance - Patient > 75% Max wheelchair distance: 100'    Wheelchair 50 feet with 2 turns activity    Assist        Assist Level: Minimal Assistance - Patient > 75%   Wheelchair 150 feet activity     Assist      Assist Level: Dependent - Patient 0%   Blood pressure 130/60, pulse (!) 106, temperature 98 F (36.7 C), temperature source Oral, resp. rate 18, height 5\' 5"  (1.651 m), weight 64.6 kg, SpO2 97 %.  Medical Problem List and Plan: 1. Functional deficits secondary to debility related to VF cardiac arrest secondary to massive pulmonary emboli status  post mechanical thrombectomy             -patient may not shower due to HD catheter- unless it's removed             -ELOS/Goals: 10-14 days supervision to min A for PT/OT SLP  -con't CIR- Continue CIR- PT, OT and SLP  Continue CIR- PT, OT and SLP Family conference  today at 11am- to discuss pt's care- and dispo 2/20- con't CIR PT, OT and SLP- but medical issues complicating her treatment/therapy- SNF hopefully after medically more stable.  2.  Antithrombotics: -DVT/anticoagulation:  Pharmaceutical: Other (comment) Eliquis             -antiplatelet therapy: N/A 3. Pain Management: Neurontin 100 mg twice daily, oxycodone as needed  2/9- had been on gabapentin for nerve pain-will stop due to sedation- will start RLE lidoderm patch 2 patches for pain.   2/13  more alert off gabapentin in spite of being on at home prior for neuropathy 4. Mood anxiety-  Cymbalta 20 mg daily, Xanax 0.25 mg daily as needed provide emotional support             -antipsychotic agents: N/A 5. Neuropsych: This patient is not capable of making decisions on her own behalf. 6. Skin/Wound Care: Routine skin checks 7. Fluids/Electrolytes/Nutrition: still not eating much  -continue TF for now.   -hopefully po intake will pick up as mentation improves.   2/20- family wants PEG_ pt kept saying doesn't- will d/w family tomorrow 70.  Pericasular hepatic /hemorrhagic shock/acute blood loss anemia/status post left liver lobe embolization  Hgb down again to 7.7 repeat non contrast CT abd no change in pericapsular hepatic bleed , check stool OB 9.  AKI secondary to ATN.  Follow-up per renal services.  Started clip for AKI- not ESRD, however Cr almost 5 today- getting HD tomorrow 2/8.   2/9- last Cr 5.74- up from 4.97 up from 4.45- rising- per renal- likely HD tomorrow?.   2/16- Cr 3.36 and BUN 26- per renal 2/20- Cr rising- for HD today- per renal  10.  Thrombocytopenia.  Suspect ITP.  Follow-up per hematology  2/13- plts 83k.  Continue to monitor (has ranged between 60k and 110k)  2/16- Plts up to 139k 11.  Hyperlipidemia.  Lipitor 12. Diabetes- Poor appetite- has Cortrak for intake due to poor appetite-stop insulin- was on at home- levemir 4 units BID when came to rehab, but dropping CBGs- so will hold- will do CBGs BID Will assess if still needs- d/c insulin due to Tfs  2/11- TF's restarted so CBGs going up- 486 this AM- restarted Levemir 4 units BID and SSI- will titrate as required.    CBG (last 3)  Recent Labs    12/17/21 0408 12/17/21 0759 12/17/21 1150  GLUCAP 327* 327* 318*  Increased Levimir to 10U q12h  Changed to moderate SSI q 4h since oral intake is low   2/20- CBGs 300s- but last in computer was noon- can't see if increase in levemir effective quite yet.  65. New onset confusion/poor memory- anoxia? From prolonged CPR  2/9- will check CT of head- and stop Gabapentin for nerve pain due to increased sedation- had before but so out of it per daughter- much worse last 2 days.   2/15- variable -tangential and  poor memory- will need to d/w daughter 45. ABLA with chronic anemia  2/13 hb stable at 8.2 15. Scab on back of head-pressure ulcer unstageable/Blister on sternum   2/9- on the back of her head  2/10- will need to have hair washed to see if we can see wound that's on CT  2/13 [place] RN examined wound today --peroxide and mupirocin bid with dry dressing  2/15- touched base with nursing- stable- unstageable on back of head- blister healing 16. HTN with hx of VF arrest  2/10- pt's BP 178/95- running 101B/510C- systolic-  will d/w renal since not on any BP meds-   2/11- Started norvasc and BP looks better 448 systolic- will con't to monitor trend.    Vitals:   12/17/21 1700 12/17/21 1720  BP: 117/68 130/60  Pulse: (!) 106 (!) 106  Resp:  18  Temp:  98 F (36.7 C)  SpO2:      17. Constipation  2/20- LBM yesterday per nursing 18. Leukocytosis-UTI_ Cx pending  2/15- will remove foley and  bladder scan; also check U/A and Cx- foley was per renal .   2/17- pt has UTI- started on Cefepime yesterday- also WBC up to 17k  2/18 WBC down to 13.2K , recheck in am   2/20- WBC up to 18.3k- due to HD catheter and no improvement- actually worse, d/w ID and pharmacy- agreed to change cefepime to rocephin- also start Vanc and check blood Cx's.  19. Fall  2/16- pt on floor last night- denied pain- CT head was ordered - pt refused to keep on mask to go downstairs- will get this AM.  20. Poor appetite-  2/17- will get PEG placed next week so can remove Cortrak- spoke with PA to get ordered  2/20- order in, but cannot do when ill/sick/  I spent a total of  52  minutes on total care today- >50% coordination of care- due to calling ID and pharmacy and also d/w charge nurse about checking dialysis catheter with blood C'x-s- also left message after attempting to call family- both daughter 3x each daughter over 15-20 minute period- 2 different numbers.        LOS: 13 days A FACE TO FACE EVALUATION WAS PERFORMED  [name] 12/17/2021, 5:48 PM

## 2021-12-17 NOTE — Progress Notes (Signed)
Physical Therapy Session Note  Patient Details  Name: [name] MRN: [id number] Date of Birth: [birth year]  Today's Date: 12/17/2021 PT Individual Time: 0800-0900 PT Individual Time Calculation (min): 60 min   Short Term Goals: Week 2:  PT Short Term Goal 1 (Week 2): =LTG due to ELOS  Skilled Therapeutic Interventions/Progress Updates:    Pt received seated in bed receiving AM medications from nursing. Pt still needing to eat breakfast this AM. Pt is setup A to prepare her breakfast, then provided Full Supervision while pt ate about 10-20% of her breakfast. Pt easily distracted during her meal by pain in R foot, decreased sensation in R foot, etc. Pt requires cues to attend to eating. Pt then agreeable to get up to w/c. Seated in bed to sitting EOB with Supervision, HOB elevated and use of bedrail. Assisted pt with donning socks and shoes while seated EOB. Pt is CGA for sit to stand to stedy from elevated bed after counting to 5. Stedy transfer to w/c. Pt exhibits some unsafe behaviors while perched in stedy including reaching to turn sink on. Once seated in w/c pt is setup A to wash her face and her hands at sink, cues needed to terminate task due to perseveration. Pt left seated in w/c in room with needs in reach, quick release belt and chair alarm in place at end of session.  Therapy Documentation Precautions:  Precautions Precautions: Fall, Other (comment) Precaution Comments: cortrak; right leg discomfort - PTA Restrictions Weight Bearing Restrictions: No      Therapy/Group: Individual Therapy   Excell Seltzer, PT, DPT, CSRS  12/17/2021, 9:56 AM

## 2021-12-17 NOTE — Progress Notes (Signed)
Per Dialysis nurse, will run Vancomycin while at dialysis.

## 2021-12-17 NOTE — Progress Notes (Signed)
Per Dr. Marval Regal with nephrology, blood cultures to be drawn while patient is in HD today.

## 2021-12-17 NOTE — Progress Notes (Signed)
Pharmacy Antibiotic Note  [name] is a 71 y.o. female admitted on 12/04/2021 with bacteremia.  Pharmacy has been consulted for vanco and ceftriaxone dosing.  Plan: Vanco 1500 mg iv load dose followed by 750 mg iv every Monday, Wednesday, and Friday post hemodialysis Ceftriaxone 2 grams iv q24h  Height: 5\' 5"  (165.1 cm) Weight: 64.6 kg (142 lb 6.7 oz) IBW/kg (Calculated) : 57  Temp (24hrs), Avg:98.8 F (37.1 C), Min:97.9 F (36.6 C), Max:100.4 F (38 C)  Recent Labs  Lab 12/12/21 1216 12/13/21 0541 12/14/21 0628 12/15/21 0641 12/17/21 0531  WBC 15.1* 14.9* 17.1* 13.2* 18.3*  CREATININE 3.36*  --  3.30* 2.45* 3.81*    Estimated Creatinine Clearance: 11.8 mL/min (A) (by C-G formula based on SCr of 3.81 mg/dL (H)).    Allergies  Allergen Reactions   Aspirin Nausea And Vomiting    Other reaction(s): Unknown   Benadryl [Diphenhydramine]     Can only take dye free. States the dye causes itching.   Darvon [Propoxyphene] Itching    Can only during the day. Night time makes her itch   Hydrocodone Bit-Homatrop Mbr Itching   Metformin     Other reaction(s): GI   Other     Other reaction(s): Unknown. States reaction was to nasal spray that caused pupils to shrink   Pentazocine     nervous Other reaction(s): jitters   Percocet [Oxycodone-Acetaminophen]     itching   Sulfa Antibiotics Hives   Tetracaine Hcl     Other reaction(s): Unknown. Thinks caused itching.   Tetracyclines & Related Hives   Tramadol     Other reaction(s): itch    Antimicrobials this admission: 2/16 cefepime >> 2/20 2/20 ceftriaxone >>  2/20 vanco HD >>   Microbiology results: 2/20 BCx: ordered at 08:21 (12/17/2021) 2/16 UCx: Kleb pneumo resistant to ampicillin (ONLY)    Thank you for allowing pharmacy to be a part of this patients care.  Vaughan Basta BS, PharmD, BCPS Clinical Pharmacist 12/17/2021 8:28 AM

## 2021-12-17 NOTE — Progress Notes (Signed)
Speech Language Pathology Daily Session Note  Patient Details  Name: [name] MRN: [id number] Date of Birth: Jun 07, 1951  Today's Date: 12/17/2021 SLP Individual Time: 1027-2536 SLP Individual Time Calculation (min): 39 min  Short Term Goals: Week 2: SLP Short Term Goal 1 (Week 2): Patient will demonstrate sustained attention to functional tasks for 5 minutes with Mod verbal cues for redirection. SLP Short Term Goal 2 (Week 2): Patient will initaite functional tasks with Mod A multimodal cues. SLP Short Term Goal 3 (Week 2): Patient will demonstrate orientation to place, time and situation with Mod verbal and visual cues. SLP Short Term Goal 4 (Week 2): Patient will identify 2 cognitive and 2 physical deficits with Mod multimodal cues. SLP Short Term Goal 5 (Week 2): Patient will consume current diet with minimal overt s/s of aspiration with Min verbal cues for use of swallowing compensatory strategies.  Skilled Therapeutic Interventions: Skilled ST services focused on cognitive skills. Pt was oriented to all but date/day. SLP facilitated sustained attention in card sorting task by 3 colors, pt require min A verbal cues for attention in 5 minute intervals and min A fade to supervision A verbal cues for problem solving/error awareness. Pt expressed urgent need to use the bathroom. NT and SLP assisted with transfer in stedy. Pt was able to void. Pt demonstrated increase task initiation compared to last session with Probation officer. Pt demonstrated recall of date/day mod I at the end of the session. Pt expressed leg pain and requested medication. Pt was able to verbalize call bell protocol but required mod A verbal cues to press button. Pt was left bed with call bell within reach and bed alarm set. Recommend to continue skilled ST services.     Pain Pain Assessment Pain Scale: 0-10 Pain Score: 4  Pain Type: Acute pain Pain Location: Leg Pain Intervention(s): RN made aware  Therapy/Group:  Individual Therapy  Bastian Andreoli  [facility] 12/17/2021, 4:44 PM

## 2021-12-17 NOTE — Progress Notes (Addendum)
° °  IR aware of G tube request Anatomy has been approved for same Tmax 100.4 2/19 Pt also on Eliquis (will need off 48 hrs)  When afeb 24 hrs and wbc wnl--- we will plan for G tube when can IR PA will address Eliquis and temp and meds when G tube planning is appropriate   Pt is on IR Radar

## 2021-12-17 NOTE — Progress Notes (Signed)
Patient ID: [name], female   DOB: 01-May-1951, 71 y.o.   MRN: [id number] S: No complaints this morning.  Continues to have low grade fever. O:BP 126/68 (BP Location: Left Arm)    Pulse (!) 105    Temp 99.3 F (37.4 C) (Oral)    Resp 19    Ht 5\' 5"  (1.651 m)    Wt 64.6 kg    SpO2 97%    BMI 23.70 kg/m   Intake/Output Summary (Last 24 hours) at 12/17/2021 1255 Last data filed at 12/17/2021 1237 Gross per 24 hour  Intake 200 ml  Output --  Net 200 ml   Intake/Output: I/O last 3 completed shifts: In: 330 [P.O.:330] Out: -   Intake/Output this shift:  Total I/O In: 70 [P.O.:70] Out: -  Weight change: -3 kg Gen:NAD ION:GEXBM at 105 Resp:CTA Abd: +BS, soft, NT/ND Ext: no edema  Recent Labs  Lab 12/12/21 1216 12/14/21 0628 12/15/21 0641 12/16/21 0709 12/17/21 0531  NA 136 129* 128*  --  131*  K 4.0 4.6 4.7  --  4.6  CL 100 94* 93*  --  95*  CO2 26 24 26   --  24  GLUCOSE 172* 594* 603* 595* 356*  BUN 26* 32* 28*  --  62*  CREATININE 3.36* 3.30* 2.45*  --  3.81*  ALBUMIN 2.9* 3.1* 2.7*  --  2.6*  CALCIUM 9.2 9.1 8.5*  --  9.2  PHOS 3.4 2.8 2.6  --   --   AST  --   --   --   --  33  ALT  --   --   --   --  37   Liver Function Tests: Recent Labs  Lab 12/14/21 0628 12/15/21 0641 12/17/21 0531  AST  --   --  33  ALT  --   --  37  ALKPHOS  --   --  121  BILITOT  --   --  0.4  PROT  --   --  6.7  ALBUMIN 3.1* 2.7* 2.6*   No results for input(s): LIPASE, AMYLASE in the last 168 hours. No results for input(s): AMMONIA in the last 168 hours. CBC: Recent Labs  Lab 12/12/21 1216 12/12/21 1216 12/13/21 0541 12/14/21 0628 12/15/21 0641 12/17/21 0531  WBC 15.1*  --  14.9* 17.1* 13.2* 18.3*  NEUTROABS  --    < > 10.9* 14.5* 9.8* 15.7*  HGB 8.4*  --  8.6* 9.4* 7.7* 8.7*  HCT 26.3*  --  27.2* 29.0* 24.4* 26.4*  MCV 89.5  --  89.8 88.7 90.0 88.3  PLT 123*  --  139* 169 169 214   < > = values in this interval not displayed.   Cardiac Enzymes: No results for  input(s): CKTOTAL, CKMB, CKMBINDEX, TROPONINI in the last 168 hours. CBG: Recent Labs  Lab 12/16/21 2005 12/17/21 0003 12/17/21 0408 12/17/21 0759 12/17/21 1150  GLUCAP 147* 311* 327* 327* 318*    Iron Studies: No results for input(s): IRON, TIBC, TRANSFERRIN, FERRITIN in the last 72 hours. Studies/Results: No results found.  ALPRAZolam  0.25 mg Oral Daily   amLODipine  10 mg Oral Q2000   apixaban  5 mg Oral BID   atorvastatin  20 mg Oral Daily   B-complex with vitamin C  1 tablet Oral Daily   chlorhexidine  15 mL Mouth Rinse BID   Chlorhexidine Gluconate Cloth  6 each Topical Q0600   dicyclomine  10 mg Oral TID AC  DULoxetine  20 mg Oral Daily   feeding supplement (NEPRO CARB STEADY)  237 mL Oral TID BM   feeding supplement (OSMOLITE 1.5 CAL)  975 mL Per Tube Q24H   feeding supplement (PROSource TF)  45 mL Per Tube BID   folic acid  2 mg Oral Daily   heparin sodium (porcine)  3,200 Units Intracatheter Once   insulin aspart  0-15 Units Subcutaneous Q4H   insulin detemir  10 Units Subcutaneous BID   lidocaine  2 patch Transdermal Q24H   megestrol  800 mg Oral Daily   mupirocin cream  1 application Topical BID   pantoprazole  40 mg Oral BID   senna  2 tablet Oral Daily    BMET    Component Value Date/Time   NA 131 (L) 12/17/2021 0531   K 4.6 12/17/2021 0531   CL 95 (L) 12/17/2021 0531   CO2 24 12/17/2021 0531   GLUCOSE 356 (H) 12/17/2021 0531   BUN 62 (H) 12/17/2021 0531   CREATININE 3.81 (H) 12/17/2021 0531   CREATININE 1.14 (H) 03/06/2017 1502   CALCIUM 9.2 12/17/2021 0531   GFRNONAA 12 (L) 12/17/2021 0531   GFRNONAA 50 (L) 03/06/2017 1502   GFRAA 51 (L) 04/11/2019 1121   GFRAA 57 (L) 03/06/2017 1502   CBC    Component Value Date/Time   WBC 18.3 (H) 12/17/2021 0531   RBC 2.99 (L) 12/17/2021 0531   HGB 8.7 (L) 12/17/2021 0531   HCT 26.4 (L) 12/17/2021 0531   PLT 214 12/17/2021 0531   MCV 88.3 12/17/2021 0531   MCH 29.1 12/17/2021 0531   MCHC 33.0  12/17/2021 0531   RDW 15.2 12/17/2021 0531   LYMPHSABS 1.3 12/17/2021 0531   MONOABS 1.1 (H) 12/17/2021 0531   EOSABS 0.1 12/17/2021 0531   BASOSABS 0.1 12/17/2021 0531     Assessment/Plan: Acute kidney injury secondary due to ischemic ATN in the setting of cardiac arrest, shock and contrast.  She has underlying CKD with baseline creatinine level around 1.3 back in 2021.  She has been RRT dependent since 10/31/2021 which was discussed with the patient and family.  Now receiving HD per MWF schedule.  See already has a spot at [facility] TTS schedule second shift for AKI.   Continue dialysis on MWF schedule for now and will get on TTS schedule when closer to discharge okay for lab draws with dialysis from nephrology perspective  V-fib cardiac arrest outside the hospital due to massive PE.  Massive PE status post mechanical thrombectomy on 1/2. Vasc US- no DVT, liver mass noted 10/28/21 CT abd/ pelvis, s/p MRI abd--> incompletely characterized, had MRI with gadolinium 1/20  to reevaluate -- not definitive. S/p hem/onc eval.  Retroperitoneal bleed/Hemorrhagic shock/Acute blood loss anemia originating from left liver lobe s/p embolization 1/4--> required FFP, plts, Vit K, pRBCs; now stable. Hematology following - no ESA due to concern for her recent thromboembolic disease. Transfuse as needed  Thrombocytopenia- suspected ITP, s/p Nplate PRN and dex. -  work up and mgmt per heme.  Overall much improved Acute hypoxic RF- extubated 11/07/21, now much improved Hyperglycemia/DM2: insulin management per primary team Encephalopathy: Likely multifactorial with delirium being the main contributor.  Management per primary team Leukocytosis: On antibiotics with cefepime.  Management per primary team.  Plan to draw blood cultures from HD catheter today.  Donetta Potts, MD [employer] [phone]

## 2021-12-17 NOTE — Progress Notes (Signed)
Pharmacy contacted in regards to IV antibiotics. Awaiting blood cultures in order to start antibiotics. Pharmacy aware pt is to go to dialysis today.   IV unable to pull from chest port with out neph approval.

## 2021-12-17 NOTE — Progress Notes (Signed)
Secure chat to Dr. Jonnie Finner regarding request to draw blood cultures via hemodialysis catheter.  At this time no response therefore paged regarding this request.  Awaiting call back.  Lovena Le, RN aware that IV team is unable to collect blood cultures via [facility] until approval has been given.

## 2021-12-17 NOTE — Progress Notes (Signed)
Per Dr. Marval Regal HD is unable to draw blood cultures therefore order given for IV team to draw these from the HD catheter.

## 2021-12-18 ENCOUNTER — Inpatient Hospital Stay (HOSPITAL_COMMUNITY): Payer: Medicare PPO

## 2021-12-18 DIAGNOSIS — Z992 Dependence on renal dialysis: Secondary | ICD-10-CM

## 2021-12-18 DIAGNOSIS — R7881 Bacteremia: Secondary | ICD-10-CM

## 2021-12-18 DIAGNOSIS — N179 Acute kidney failure, unspecified: Secondary | ICD-10-CM

## 2021-12-18 DIAGNOSIS — N189 Chronic kidney disease, unspecified: Secondary | ICD-10-CM

## 2021-12-18 LAB — CBC WITH DIFFERENTIAL/PLATELET
Abs Immature Granulocytes: 0.13 10*3/uL — ABNORMAL HIGH (ref 0.00–0.07)
Basophils Absolute: 0.1 10*3/uL (ref 0.0–0.1)
Basophils Relative: 0 %
Eosinophils Absolute: 0.2 10*3/uL (ref 0.0–0.5)
Eosinophils Relative: 1 %
HCT: 25.3 % — ABNORMAL LOW (ref 36.0–46.0)
Hemoglobin: 8 g/dL — ABNORMAL LOW (ref 12.0–15.0)
Immature Granulocytes: 1 %
Lymphocytes Relative: 13 %
Lymphs Abs: 2.1 10*3/uL (ref 0.7–4.0)
MCH: 28 pg (ref 26.0–34.0)
MCHC: 31.6 g/dL (ref 30.0–36.0)
MCV: 88.5 fL (ref 80.0–100.0)
Monocytes Absolute: 1.9 10*3/uL — ABNORMAL HIGH (ref 0.1–1.0)
Monocytes Relative: 12 %
Neutro Abs: 11.7 10*3/uL — ABNORMAL HIGH (ref 1.7–7.7)
Neutrophils Relative %: 73 %
Platelets: 17 10*3/uL — CL (ref 150–400)
RBC: 2.86 MIL/uL — ABNORMAL LOW (ref 3.87–5.11)
RDW: 15 % (ref 11.5–15.5)
WBC: 16 10*3/uL — ABNORMAL HIGH (ref 4.0–10.5)
nRBC: 0 % (ref 0.0–0.2)

## 2021-12-18 LAB — RENAL FUNCTION PANEL
Albumin: 2.6 g/dL — ABNORMAL LOW (ref 3.5–5.0)
Anion gap: 9 (ref 5–15)
BUN: 28 mg/dL — ABNORMAL HIGH (ref 8–23)
CO2: 28 mmol/L (ref 22–32)
Calcium: 8.7 mg/dL — ABNORMAL LOW (ref 8.9–10.3)
Chloride: 96 mmol/L — ABNORMAL LOW (ref 98–111)
Creatinine, Ser: 1.96 mg/dL — ABNORMAL HIGH (ref 0.44–1.00)
GFR, Estimated: 27 mL/min — ABNORMAL LOW (ref >60)
Glucose, Bld: 343 mg/dL — ABNORMAL HIGH (ref 70–99)
Phosphorus: 3 mg/dL (ref 2.5–4.6)
Potassium: 4.3 mmol/L (ref 3.5–5.1)
Sodium: 133 mmol/L — ABNORMAL LOW (ref 135–145)

## 2021-12-18 LAB — BLOOD CULTURE ID PANEL (REFLEXED) - BCID2

## 2021-12-18 LAB — ECHOCARDIOGRAM COMPLETE
AR max vel: 1.99 cm2
AV Area VTI: 2.56 cm2
AV Area mean vel: 2.26 cm2
AV Mean grad: 6 mmHg
AV Peak grad: 11 mmHg
Ao pk vel: 1.66 m/s
Area-P 1/2: 3.27 cm2
Calc EF: 76.1 %
Height: 65 in
S' Lateral: 2.2 cm
Single Plane A2C EF: 54.1 %
Single Plane A4C EF: 86.7 %
Weight: 2271.62 oz

## 2021-12-18 LAB — HEPATIC FUNCTION PANEL
ALT: 48 U/L — ABNORMAL HIGH (ref 0–44)
AST: 41 U/L (ref 15–41)
Albumin: 2.7 g/dL — ABNORMAL LOW (ref 3.5–5.0)
Alkaline Phosphatase: 119 U/L (ref 38–126)
Bilirubin, Direct: <0.1 mg/dL (ref 0.0–0.2)
Total Bilirubin: 0.4 mg/dL (ref 0.3–1.2)
Total Protein: 7.2 g/dL (ref 6.5–8.1)

## 2021-12-18 LAB — GLUCOSE, CAPILLARY
Glucose-Capillary: 133 mg/dL — ABNORMAL HIGH (ref 70–99)
Glucose-Capillary: 245 mg/dL — ABNORMAL HIGH (ref 70–99)
Glucose-Capillary: 258 mg/dL — ABNORMAL HIGH (ref 70–99)
Glucose-Capillary: 269 mg/dL — ABNORMAL HIGH (ref 70–99)
Glucose-Capillary: 290 mg/dL — ABNORMAL HIGH (ref 70–99)
Glucose-Capillary: 352 mg/dL — ABNORMAL HIGH (ref 70–99)
Glucose-Capillary: 377 mg/dL — ABNORMAL HIGH (ref 70–99)

## 2021-12-18 MED ORDER — PAROXETINE HCL 20 MG PO TABS
20.0000 mg | ORAL_TABLET | Freq: Every day | ORAL | Status: DC
  Administered 2021-12-18 – 2021-12-25 (×8): 20 mg via ORAL
  Filled 2021-12-18 (×8): qty 1

## 2021-12-18 MED ORDER — INSULIN DETEMIR 100 UNIT/ML ~~LOC~~ SOLN
14.0000 [IU] | Freq: Two times a day (BID) | SUBCUTANEOUS | Status: DC
  Administered 2021-12-18 – 2022-01-02 (×29): 14 [IU] via SUBCUTANEOUS
  Filled 2021-12-18 (×33): qty 0.14

## 2021-12-18 MED ORDER — SODIUM CHLORIDE 0.9 % IV SOLN
2.0000 g | Freq: Two times a day (BID) | INTRAVENOUS | Status: DC
  Administered 2021-12-19 – 2021-12-20 (×4): 2 g via INTRAVENOUS
  Filled 2021-12-18 (×5): qty 2000

## 2021-12-18 MED ORDER — SODIUM CHLORIDE 0.9% IV SOLUTION: Freq: Once | INTRAVENOUS | Status: AC

## 2021-12-18 MED ORDER — CHLORHEXIDINE GLUCONATE CLOTH 2 % EX PADS
6.0000 | MEDICATED_PAD | Freq: Every day | CUTANEOUS | Status: DC
  Administered 2021-12-20 – 2021-12-23 (×3): 6 via TOPICAL

## 2021-12-18 MED ORDER — CALCIUM CARBONATE ANTACID 500 MG PO CHEW
1.0000 | CHEWABLE_TABLET | ORAL | Status: DC | PRN
  Administered 2021-12-18 – 2021-12-29 (×3): 200 mg via ORAL
  Filled 2021-12-18 (×3): qty 1

## 2021-12-18 NOTE — Progress Notes (Signed)
Occupational Therapy Session Note  Patient Details  Name: MACKENA PLUMMER MRN: [id number] Date of Birth: 1950-11-12  Today's Date: 12/18/2021 OT Individual Time: 1130-1200 OT Individual Time Calculation (min): 30 min    Short Term Goals: Week 2:  OT Short Term Goal 1 (Week 2): STG = LTG 2/2 LOS  Skilled Therapeutic Interventions/Progress Updates:    Pt resting in w/c upon arrival. Pt denies pain this morning. OT intervention with focus on sit<>stand and standing balance in [place]. Sit<>stand from w/c in [place] with CGA x 4. Standing balance in [place] with CGA to reach for bean bags and toss to [employer]. Pt performed task x4. Pt returned to w/c. Belt alarm activated. All needs within reach.   Therapy Documentation Precautions:  Precautions Precautions: Fall, Other (comment) Precaution Comments: cortrak; right leg discomfort - PTA Restrictions Weight Bearing Restrictions: No   Pain: Pain Assessment Pain Scale: 0-10 Pain Score: 0-No pain  Therapy/Group: Individual Therapy  Leroy Libman 12/18/2021, 12:03 PM

## 2021-12-18 NOTE — Progress Notes (Signed)
Inpatient Diabetes Program Recommendations  AACE/ADA: New Consensus Statement on Inpatient Glycemic Control (2015)  Target Ranges:  Prepandial:   less than 140 mg/dL      Peak postprandial:   less than 180 mg/dL (1-2 hours)      Critically ill patients:  140 - 180 mg/dL   Lab Results  Component Value Date   GLUCAP 245 (H) 12/18/2021   HGBA1C 9.0 (H) 10/28/2021    Review of Glycemic Control  Latest Reference Range & Units 12/17/21 20:05 12/18/21 00:03 12/18/21 04:02 12/18/21 07:48 12/18/21 11:27  Glucose-Capillary 70 - 99 mg/dL 192 (H) 377 (H) 290 (H) 258 (H) 245 (H)   Diabetes history: DM 2 Outpatient Diabetes medications:  Novolog 0-9 units q 4 hours, Levemir 4 units bid Current orders for Inpatient glycemic control:  Levemir 14 units bid, Novolog moderate q 4 hours Osmolite 1.5 cal- 75 ml/hr from 1800-0700 (13 hours)  Inpatient Diabetes Program Recommendations:   May consider adding tube feed coverage at 2000, 1200 MN, and 0400 am-3 units?  May do better with tube feed coverage then increasing basal?? If tube feed coverage added recommend reducing Levemir back to 10 units bid.   Thanks,  Adah Perl, RN, BC-ADM Inpatient Diabetes Coordinator Pager [phone]  (8a-5p)

## 2021-12-18 NOTE — Progress Notes (Signed)
[facility] Progress Note   Subjective:  Seen in room - looks well, working with PT. Denies CP/dyspnea. No change in urine output (minimal). No new bruising or bleeding. Noted that Plts acutely back down this morning. Hematology was previously involved -> would recommend getting the back on board for recommendations. Blood Cx 2/20 growing Enterococcus. Had been on Cefepime 2/16-2/20, then changed to Ceftriaxone + Vancomycin, looks like now narrowed to IV ampicillin.  Objective Vitals:   12/17/21 1758 12/17/21 1930 12/18/21 0322 12/18/21 0458  BP: 101/77 (!) 119/57 123/67   Pulse: 100 99 100   Resp: 18 18 18    Temp: 98.2 F (36.8 C) 98.3 F (36.8 C) 98.8 F (37.1 C)   TempSrc: Oral Oral Oral   SpO2: 100% 99% 99%   Weight:    64.4 kg  Height:       Physical Exam General: Well appearing woman, NAD. Room air. NG tube in place/clamped. Heart: RRR; no murmur Lungs: CTAB; no rales or wheezing Extremities: No LE edema, mild UE tremors with movement Dialysis Access: [facility]  Additional Objective Labs: Basic Metabolic Panel: Recent Labs  Lab 12/14/21 0628 12/15/21 0641 12/16/21 0709 12/17/21 0531 12/18/21 0537  NA 129* 128*  --  131* 133*  K 4.6 4.7  --  4.6 4.3  CL 94* 93*  --  95* 96*  CO2 24 26  --  24 28  GLUCOSE 594* 603* 595* 356* 343*  BUN 32* 28*  --  62* 28*  CREATININE 3.30* 2.45*  --  3.81* 1.96*  CALCIUM 9.1 8.5*  --  9.2 8.7*  PHOS 2.8 2.6  --   --  3.0   Liver Function Tests: Recent Labs  Lab 12/15/21 0641 12/17/21 0531 12/18/21 0537  AST  --  33  --   ALT  --  37  --   ALKPHOS  --  121  --   BILITOT  --  0.4  --   PROT  --  6.7  --   ALBUMIN 2.7* 2.6* 2.6*   CBC: Recent Labs  Lab 12/14/21 0628 12/15/21 0641 12/17/21 0531 12/18/21 0644 12/18/21 0832  WBC 17.1* 13.2* 18.3* 15.5* 16.0*  NEUTROABS 14.5* 9.8* 15.7* 12.0* 11.7*  HGB 9.4* 7.7* 8.7* 8.1* 8.0*  HCT 29.0* 24.4* 26.4* 24.6* 25.3*  MCV 88.7 90.0 88.3 87.9 88.5  PLT 169  169 214 17* 17*   Blood Culture    Component Value Date/Time   SDES BLOOD RIGHT HAND 12/17/2021 1320   SPECREQUEST  12/17/2021 1320    BOTTLES DRAWN AEROBIC AND ANAEROBIC Blood Culture adequate volume   CULT (A) 12/17/2021 1320    ENTEROCOCCUS FAECALIS SUSCEPTIBILITIES TO FOLLOW Performed at [facility], [place] [address] [place], [place] [address]    REPTSTATUS PENDING 12/17/2021 1320   Medications:  sodium chloride 10 mL/hr at 12/17/21 1828   [START ON 12/19/2021] ampicillin (OMNIPEN) IV      ALPRAZolam  0.25 mg Oral Daily   amLODipine  10 mg Oral Q2000   apixaban  5 mg Oral BID   atorvastatin  20 mg Oral Daily   B-complex with vitamin C  1 tablet Oral Daily   chlorhexidine  15 mL Mouth Rinse BID   Chlorhexidine Gluconate Cloth  6 each Topical Q0600   dicyclomine  10 mg Oral TID AC   feeding supplement (NEPRO CARB STEADY)  237 mL Oral TID BM   feeding supplement (OSMOLITE 1.5 CAL)  975 mL Per Tube  Q24H   feeding supplement (PROSource TF)  45 mL Per Tube BID   folic acid  2 mg Oral Daily   heparin sodium (porcine)  3,200 Units Intracatheter Once   insulin aspart  0-15 Units Subcutaneous Q4H   insulin detemir  14 Units Subcutaneous BID   lidocaine  2 patch Transdermal Q24H   megestrol  800 mg Oral Daily   mupirocin cream  1 application Topical BID   pantoprazole  40 mg Oral BID   PARoxetine  20 mg Oral QHS   senna  2 tablet Oral Daily    Dialysis Orders: Establishing AKI orders. Has outpatient spot at [facility] TTS 2nd shift, will change schedule when getting closer to discharge.  Assessment/Plan: Severe AKI: felt d/t ischemic ATN in setting of cardiac arrest, shock, IV contrast. Underlying CKD with baseline Cr 1.3 in 2021. Dialysis started 10/31/2021 (CRRT initially). Urine output remains low. Continue HD as AKI for now on MWF schedule.  Massive PE s/p mechanical thrombectomy 10/29/21: Vascular US without DVT. Did have partial hypercoaguable work-up with + lupus  anticoagulant. On Eliqius.  V-fib cardiac arrest: Outside of hospital d/t PE (on admit).  Thrombocytopenia: Plts down to 5K range, hematology was following. HIT negative. Suspected ITP s/p Nplate and steroids. S/p BMBx without malignancy. Was improving, but now with acute drop, ?possibly d/t bacteremia - would re-engage hematology for recs.  Retroperitoneal bleed/hemorrhagic shock/ABLA: Originating from L liver lobe, s/p embolization 1/4 - required FFP, plts, Vit K, PRBCs; now stable.  Possible liver mass: On CT 10/28/21, s/p MRI 1/20 incompletely characterized.   Fever/Enterococcus bacteremia: NEW Blood Cx 2/20 +. Now on IV ampicillin.  Acute hypoxic respiratory failure (resolved): D/t #2, extubated 11/07/2021.  T2DM/hyperglycemia: Per primary team.  Encephalopathy: Likely multifactorial, improved/resolved. Anemia (CKD, prior ABLA): Hgb 8. S/p 21U PRBCs this admit. Heme previously had not recommended ESA d/t likely hypercoagulable state.   Secondary hyperparathyroidism: CorrCa, Phos ok without binders. PTH 129 11/22/21 - no VDRA for now.  Nutrition: Alb low - ongoing bolus TF's and protein supplements.  Debility/weakness: Currently in CIR for therapy.  Veneta Penton, PA-C 12/18/2021, 12:01 PM  [employer]

## 2021-12-18 NOTE — Progress Notes (Signed)
Physical Therapy Session Note  Patient Details  Name: [name] MRN: [id number] Date of Birth: [birth year]  Today's Date: 12/18/2021 PT Individual Time: 1600-1700 PT Individual Time Calculation (min): 60 min   Short Term Goals: Week 2:  PT Short Term Goal 1 (Week 2): =LTG due to ELOS  Skilled Therapeutic Interventions/Progress Updates:    Pt received seated EOB handed off from NT. Pt agreeable with encouragement to participate in therapy session. Pt initially perseverating on fixing her hair but able to be redirected to don her bonnet and participate in therapy session. Nursing in room to hook pt up to blood product and assess vitals while pt seated EOB with close Supervision for sitting balance. Sit to stand with CGA to stedy. Stedy transfer to w/c. Dependent transport via w/c to/from therapy gym for time and energy conservation. Sit to stand in // bars with CGA initially increasing to min A with onset of fatigue. Ambulation x 5 ft, 2 x 10 ft in // bars with min A for balance and close w/c follow for safety. Sit to stand to RW with mod A. Ambulation x 10 ft with RW and mod A with close w/c follow for safety from w/c back to bed! Pt encouraged by her progress with mobility this date and ability to ambulate with RW. Sit to supine Supervision. Pt left seated in bed with needs in reach, bed alarm in place.  Therapy Documentation Precautions:  Precautions Precautions: Fall, Other (comment) Precaution Comments: cortrak; right leg discomfort - PTA Restrictions Weight Bearing Restrictions: No      Therapy/Group: Individual Therapy   Excell Seltzer, PT, DPT, CSRS  12/18/2021, 5:02 PM
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
## 2021-12-18 NOTE — Progress Notes (Signed)
° °  Echocardiogram 2D Echocardiogram has been performed.  Beryle Beams 12/18/2021, 10:28 AM

## 2021-12-18 NOTE — Consult Note (Signed)
[facility]                                                                                        Patient Identification: Patient Name: [name] MRN: [id number] Admit Date: 12/04/2021  6:47 PM Today's Date: 12/18/2021 Reason for consult: bacteremia  Requesting provider: Courtney Heys   Principal Problem:   Debility Active Problems:   Cardiac arrest [facility])   Acute saddle pulmonary embolism with acute cor pulmonale (HCC)   Acute on chronic renal failure (HCC)   Anoxia of brain ([place])   Antibiotics:  cefepime 1/1-1/7 Ceftriaxone 1/15-1/19,  2/20    vancomycin  1/1. 1/3. 1/5-1/9, 1/15-1/18, 2/20 Metronidazole 1/4-1/8 Meropenem 1/8-1/11  Lines/Hardware: RT IJ tunneled HD catheter  Assessment 71 year old female with PMH of Anxiety/depression, Arthritis, Fibromyalgia, Asthma, DM type II, GERD, HLD, HTN who was transferred to rehab on 2/7 after hospital admission on 1/1 for prolonged out of hospital V-fib arrest secondary to massive pulmonary embolism with obstructive shock. Hospital course complicated by hemorrhagic shock 2/2 hepatic hemorrhage requiring IR embolization, requiring multiple transfusions, acute renal failure, possible recurrent aspiration PNA and thrombocytopenia (s/p N plate and dexamethasone for concerns of ITP) > Discharged to IP rehab. Found to have   High grade E faecalis bacteremia r/o endocarditis  She does not have any urinary or GI symptoms currently but has h/o hepatic bleed with concerns for bacterial translocation. UA with more than 50 WBCs. Recent urine cultures with only 20,000 colonies of kleb pneumo. She does have a HD catheter which could be the source as well   Leukocytosis 2/2 above, improving  Thrombocytopenia with previous concerns for ITP/consumptive coagulopathy and positive lupus anticoagulant  AKI in the setting of CKD - on HD, nephrology  following  Recommendations  Will switch Vancomycin and ceftriaxone to IV Ampicillin  Repeat blood cultures 2 sets on 2/22 TTE to start, may need TEE  Consider removal/exchange of HD catheter given potential source  Oncology re-eval given sudden drop in platelets and prior concern for ITP Monitor CBC and BMP  Rest of the management as per the primary team. Please call with questions or concerns.  Thank you for the consult  Rosiland Oz, MD Infectious Disease Physician [facility] [address] [place], [place] [address] Phone: [phone]   Fax: [phone]  __________________________________________________________________________________________________________ HPI and Hospital Course: 71 year old female with PMH of Anxiety/depression, Arthritis, Fibromyalgia, Asthma, DM type II, GERD, HLD, HTN who was transferred to rehab on 2/7 after hospital admission on 1/1 for prolonged out of hospital V-fib arrest secondary to massive pulmonary embolism with obstructive shock.  Patient had mechanical thrombectomy of right and left PA on 1/2. Course complicated by hemorrhagic shock with large intraperitoneal, intraparenchymal hepatic hemorrhage extending into subcapsular hematoma for which she received multiple blood transfusions FFP's and platelets, thrombocytopenia ( s/p N plate and dexamethasone for concerns of ITP) Required to be on vasopressors. Patient also had embolization of left hepatic lobe 11/01/21. Treated for recurrent aspiration PNA with antibiotics.   Extubated on 1/10.  1/12 MRI abdomen with large area of  intrahepatic hemorrhage, cannot exclude underlying mass, peritoneal hemorrhage and bilateral lower lobe pneumonia. Seen by Oncology and to be followed up OP for possibility of malignancy. Also required to be on CRRT transitioned to intermittent hemodialysis.  Status post IR guided bone marrow biopsy of left iliac bone which said  hypercellular marrow, possible element of myelodysplasia.   Noted to have gradual uptrending WBC count from 2/12 with fevers on 2/19 which are positive for E faecalis.   CT abd/pelvis 2/18 redemonstrated large subcapsular lesion of the inferior left lobe of the liver, hepatic segment 3, measuring 8.9 by 6 cm not significantly changed.  Lesion is now near full internal attenuation generally consistent with a resolving hematoma and previous embolized.  Underlying lesion such as hemangioma or malignancy complicated by hemorrhage is not excluded.  No evidence of retroperitoneal hematoma  Denies any fevers, chills Denies any SOB and cough Denies nausea, vomiting and abdominal pain She is on laxatives and denies dysuria or increased frequency of urination.  Denies any pain and tenderness in the HD catheter site   ROS: all systems reviewed and negative except as above  Past Medical History:  Diagnosis Date   Anxiety    Arthritis    Asthma    Cardiac arrest ([place]) 10/28/2021   Cardiac arrest ([place]) 10/28/2021   Depression    Diabetes mellitus without complication (HCC)    Fibromyalgia    GERD (gastroesophageal reflux disease)    Hyperlipemia    Hypertension    PONV (postoperative nausea and vomiting)    Past Surgical History:  Procedure Laterality Date   COLONOSCOPY     DILATION AND CURETTAGE OF UTERUS     IR EMBO ART  VEN HEMORR LYMPH EXTRAV  INC GUIDE ROADMAPPING  11/01/2021   IR FLUORO GUIDE CV LINE RIGHT  11/20/2021   IR THROMBECT PRIM MECH INIT (INCLU) MOD SED  10/29/2021   IR US GUIDE VASC ACCESS RIGHT  10/29/2021   IR US GUIDE VASC ACCESS RIGHT  11/01/2021   IR US GUIDE VASC ACCESS RIGHT  11/21/2021   KNEE ARTHROSCOPY  [id number]   left   SHOULDER ARTHROSCOPY     left   TRIGGER FINGER RELEASE  08/13/2012   Procedure: RELEASE TRIGGER FINGER/A-1 PULLEY;  Surgeon: [name]., MD;  Location: [facility];  Service: Orthopedics;  Laterality: Right;  long finger   TUBAL  LIGATION       Scheduled Meds:  ALPRAZolam  0.25 mg Oral Daily   amLODipine  10 mg Oral Q2000   apixaban  5 mg Oral BID   atorvastatin  20 mg Oral Daily   B-complex with vitamin C  1 tablet Oral Daily   chlorhexidine  15 mL Mouth Rinse BID   Chlorhexidine Gluconate Cloth  6 each Topical Q0600   dicyclomine  10 mg Oral TID AC   DULoxetine  20 mg Oral Daily   feeding supplement (NEPRO CARB STEADY)  237 mL Oral TID BM   feeding supplement (OSMOLITE 1.5 CAL)  975 mL Per Tube Q24H   feeding supplement (PROSource TF)  45 mL Per Tube BID   folic acid  2 mg Oral Daily   heparin sodium (porcine)  3,200 Units Intracatheter Once   insulin aspart  0-15 Units Subcutaneous Q4H   insulin detemir  10 Units Subcutaneous BID   lidocaine  2 patch Transdermal Q24H   megestrol  800 mg Oral Daily   mupirocin cream  1 application Topical BID  pantoprazole  40 mg Oral BID   senna  2 tablet Oral Daily   Continuous Infusions:  sodium chloride 10 mL/hr at 12/17/21 1828   cefTRIAXone (ROCEPHIN)  IV 2 g (12/17/21 1837)   [START ON 12/19/2021] vancomycin     PRN Meds:.sodium chloride, acetaminophen **OR** acetaminophen (TYLENOL) oral liquid 160 mg/5 mL **OR** acetaminophen, ALPRAZolam, camphor-menthol, docusate sodium, Gerhardt's butt cream, ipratropium-albuterol, lidocaine, ondansetron, oxyCODONE, temazepam, white petrolatum  Allergies  Allergen Reactions   Aspirin Nausea And Vomiting    Other reaction(s): Unknown   Benadryl [Diphenhydramine]     Can only take dye free. States the dye causes itching.   Darvon [Propoxyphene] Itching    Can only during the day. Night time makes her itch   Hydrocodone Bit-Homatrop Mbr Itching   Metformin     Other reaction(s): GI   Other     Other reaction(s): Unknown. States reaction was to nasal spray that caused pupils to shrink   Pentazocine     nervous Other reaction(s): jitters   Percocet [Oxycodone-Acetaminophen]     itching   Sulfa Antibiotics Hives    Tetracaine Hcl     Other reaction(s): Unknown. Thinks caused itching.   Tetracyclines & Related Hives   Tramadol     Other reaction(s): itch   Breast Cancer-relatedfamily history is not on file.  Vitals BP 123/67 (BP Location: Left Arm)    Pulse 100    Temp 98.8 F (37.1 C) (Oral)    Resp 18    Ht 5' 5"  (1.651 m)    Wt 64.4 kg    SpO2 99%    BMI 23.63 kg/m    Physical Exam Constitutional:  lying in the bed and appears comfortable     Comments: dubhoff tube +  Cardiovascular:     Rate and Rhythm: Normal rate and regular rhythm.     Heart sounds:   Pulmonary:     Effort: Pulmonary effort is normal.     Comments: normal breath sounds   Abdominal:     Palpations: Abdomen is soft.     Tenderness: non tender and non distended   Musculoskeletal:        General: No swelling or tenderness.   Skin:    Comments: RT chest HD catheter- no erythema/tenderness/swelling   Neurological:     General: grossly non focal  Psychiatric:        Mood and Affect: Mood normal.    Pertinent Microbiology Results for orders placed or performed during the hospital encounter of 12/04/21  Urine Culture     Status: Abnormal   Collection Time: 12/13/21  9:40 AM   Specimen: Urine, Catheterized  Result Value Ref Range Status   Specimen Description URINE, CATHETERIZED  Final   Special Requests   Final    NONE Performed at [facility], [place] [address], [place], [place] [address]    Culture 20,000 COLONIES/mL KLEBSIELLA PNEUMONIAE (A)  Final   Report Status 12/15/2021 FINAL  Final   Organism ID, Bacteria KLEBSIELLA PNEUMONIAE (A)  Final      Susceptibility   Klebsiella pneumoniae - MIC*    AMPICILLIN RESISTANT Resistant     CEFAZOLIN <=4 SENSITIVE Sensitive     CEFEPIME <=0.12 SENSITIVE Sensitive     CEFTRIAXONE <=0.25 SENSITIVE Sensitive     CIPROFLOXACIN <=0.25 SENSITIVE Sensitive     GENTAMICIN <=1 SENSITIVE Sensitive     IMIPENEM <=0.25 SENSITIVE Sensitive     NITROFURANTOIN <=16  SENSITIVE Sensitive  TRIMETH/SULFA <=20 SENSITIVE Sensitive     AMPICILLIN/SULBACTAM 4 SENSITIVE Sensitive     PIP/TAZO <=4 SENSITIVE Sensitive     * 20,000 COLONIES/mL KLEBSIELLA PNEUMONIAE  Culture, blood (routine x 2)     Status: None (Preliminary result)   Collection Time: 12/17/21  9:05 AM   Specimen: BLOOD  Result Value Ref Range Status   Specimen Description BLOOD RIGHT ANTECUBITAL  Final   Special Requests   Final    BOTTLES DRAWN AEROBIC AND ANAEROBIC Blood Culture adequate volume   Culture  Setup Time   Final    GRAM POSITIVE COCCI IN PAIRS AEROBIC BOTTLE ONLY CRITICAL VALUE NOTED.  VALUE IS CONSISTENT WITH PREVIOUSLY REPORTED AND CALLED VALUE.    Culture   Final    NO GROWTH <12 HOURS Performed at [facility], [place] [address], [place], [place] [address]    Report Status PENDING  Incomplete  Culture, blood (routine x 2)     Status: None (Preliminary result)   Collection Time: 12/17/21  1:20 PM   Specimen: BLOOD RIGHT HAND  Result Value Ref Range Status   Specimen Description BLOOD RIGHT HAND  Final   Special Requests   Final    BOTTLES DRAWN AEROBIC AND ANAEROBIC Blood Culture adequate volume   Culture  Setup Time   Final    GRAM POSITIVE COCCI IN PAIRS IN BOTH AEROBIC AND ANAEROBIC BOTTLES CRITICAL RESULT CALLED TO, READ BACK BY AND VERIFIED WITH: V BRYK,[email or web address]  12/18/21 [place] Performed at [facility], [place] [address] [place], [place] [address]    Culture PENDING  Incomplete   Report Status PENDING  Incomplete  Blood Culture ID Panel (Reflexed)     Status: Abnormal   Collection Time: 12/17/21  1:20 PM  Result Value Ref Range Status   Enterococcus faecalis DETECTED (A) NOT DETECTED Final    Comment: CRITICAL RESULT CALLED TO, READ BACK BY AND VERIFIED WITH: V BRYK,[email or web address]  12/18/21 [place]    Enterococcus Faecium NOT DETECTED NOT DETECTED Final   Listeria monocytogenes NOT DETECTED NOT DETECTED Final   Staphylococcus species NOT DETECTED NOT  DETECTED Final   Staphylococcus aureus (BCID) NOT DETECTED NOT DETECTED Final   Staphylococcus epidermidis NOT DETECTED NOT DETECTED Final   Staphylococcus lugdunensis NOT DETECTED NOT DETECTED Final   Streptococcus species NOT DETECTED NOT DETECTED Final   Streptococcus agalactiae NOT DETECTED NOT DETECTED Final   Streptococcus pneumoniae NOT DETECTED NOT DETECTED Final   Streptococcus pyogenes NOT DETECTED NOT DETECTED Final   A.calcoaceticus-baumannii NOT DETECTED NOT DETECTED Final   Bacteroides fragilis NOT DETECTED NOT DETECTED Final   Enterobacterales NOT DETECTED NOT DETECTED Final   Enterobacter cloacae complex NOT DETECTED NOT DETECTED Final   Escherichia coli NOT DETECTED NOT DETECTED Final   Klebsiella aerogenes NOT DETECTED NOT DETECTED Final   Klebsiella oxytoca NOT DETECTED NOT DETECTED Final   Klebsiella pneumoniae NOT DETECTED NOT DETECTED Final   Proteus species NOT DETECTED NOT DETECTED Final   Salmonella species NOT DETECTED NOT DETECTED Final   Serratia marcescens NOT DETECTED NOT DETECTED Final   Haemophilus influenzae NOT DETECTED NOT DETECTED Final   Neisseria meningitidis NOT DETECTED NOT DETECTED Final   Pseudomonas aeruginosa NOT DETECTED NOT DETECTED Final   Stenotrophomonas maltophilia NOT DETECTED NOT DETECTED Final   Candida albicans NOT DETECTED NOT DETECTED Final   Candida auris NOT DETECTED NOT DETECTED Final   Candida glabrata NOT DETECTED NOT DETECTED Final   Candida krusei NOT DETECTED NOT DETECTED Final  Candida parapsilosis NOT DETECTED NOT DETECTED Final   Candida tropicalis NOT DETECTED NOT DETECTED Final   Cryptococcus neoformans/gattii NOT DETECTED NOT DETECTED Final   Vancomycin resistance NOT DETECTED NOT DETECTED Final    Comment: Performed at [facility], [place] [address], [place], [place] [address]     Pertinent Lab seen by me: CBC Latest Ref Rng & Units 12/17/2021 12/15/2021 12/14/2021  WBC 4.0 - 10.5 K/uL 18.3(H) 13.2(H)  17.1(H)  Hemoglobin 12.0 - 15.0 g/dL 8.7(L) 7.7(L) 9.4(L)  Hematocrit 36.0 - 46.0 % 26.4(L) 24.4(L) 29.0(L)  Platelets 150 - 400 K/uL 214 169 169   CMP Latest Ref Rng & Units 12/18/2021 12/17/2021 12/16/2021  Glucose 70 - 99 mg/dL 343(H) 356(H) 595(HH)  BUN 8 - 23 mg/dL 28(H) 62(H) -  Creatinine 0.44 - 1.00 mg/dL 1.96(H) 3.81(H) -  Sodium 135 - 145 mmol/L 133(L) 131(L) -  Potassium 3.5 - 5.1 mmol/L 4.3 4.6 -  Chloride 98 - 111 mmol/L 96(L) 95(L) -  CO2 22 - 32 mmol/L 28 24 -  Calcium 8.9 - 10.3 mg/dL 8.7(L) 9.2 -  Total Protein 6.5 - 8.1 g/dL - 6.7 -  Total Bilirubin 0.3 - 1.2 mg/dL - 0.4 -  Alkaline Phos 38 - 126 U/L - 121 -  AST 15 - 41 U/L - 33 -  ALT 0 - 44 U/L - 37 -     Pertinent Imagings/Other Imagings Plain films and CT images have been personally visualized and interpreted; radiology reports have been reviewed. Decision making incorporated into the Impression / Recommendations.  CT ABDOMEN PELVIS WO CONTRAST  Result Date: 12/15/2021 CLINICAL DATA:  Follow-up retroperitoneal hematoma EXAM: CT ABDOMEN AND PELVIS WITHOUT CONTRAST TECHNIQUE: Multidetector CT imaging of the abdomen and pelvis was performed following the standard protocol without IV contrast. RADIATION DOSE REDUCTION: This exam was performed according to the departmental dose-optimization program which includes automated exposure control, adjustment of the mA and/or kV according to patient size and/or use of iterative reconstruction technique. COMPARISON:  MR abdomen, 11/16/2021, CT abdomen pelvis, 10/30/2021 FINDINGS: Lower chest: Bandlike scarring and or atelectasis of the dependent bilateral lung bases, significantly improved compared to prior examination. Hepatobiliary: Redemonstrated large subcapsular lesion of the inferior left lobe of the liver, hepatic segment III, measuring 8.9 x 6.0 cm, not significantly changed. Lesion is now near fluid internal attenuation, HU = 24). No radiopaque gallstones, gallbladder wall  thickening, or biliary dilatation. Pancreas: Unremarkable. No pancreatic ductal dilatation or surrounding inflammatory changes. Spleen: Normal in size without significant abnormality. Adrenals/Urinary Tract: Adrenal glands are unremarkable. Kidneys are normal, without renal calculi, solid lesion, or hydronephrosis. Bladder is unremarkable. Stomach/Bowel: Stomach is within normal limits. Enteric feeding tube with tip and side port below the diaphragm. Appendix appears normal. No evidence of bowel wall thickening, distention, or inflammatory changes. Pancolonic diverticulosis. Vascular/Lymphatic: Aortic atherosclerosis. No enlarged abdominal or pelvic lymph nodes. Reproductive: No mass or other significant abnormality. Other: No abdominal wall hernia or abnormality. No ascites. Musculoskeletal: No acute or significant osseous findings. IMPRESSION: 1. Redemonstrated large subcapsular lesion of the inferior left lobe of the liver, hepatic segment III, measuring 8.9 x 6.0 cm, not significantly changed. Lesion is now near fluid internal attenuation, generally consistent with a resolving hematoma and previously embolized. As on prior examination, underlying liver lesion, such as hemangioma or malignancy complicated by hemorrhage is not excluded, particularly by this noncontrast examination. 2. No evidence of retroperitoneal hematoma per ordering indication. 3. Bandlike scarring and or atelectasis of the dependent bilateral  lung bases, significantly improved compared to prior examination. 4. Pancolonic diverticulosis without evidence of acute diverticulitis. Aortic Atherosclerosis (ICD10-I70.0). Electronically Signed   By: Delanna Ahmadi M.D.   On: 12/15/2021 11:40   DG Ankle Complete Right  Result Date: 11/29/2021 CLINICAL DATA:  Pain, previous trauma EXAM: RIGHT ANKLE - COMPLETE 3+ VIEW COMPARISON:  11/19/2021 FINDINGS: No recent fracture or dislocation is seen. Small bony spurs seen in the tip of medial malleolus.  Plantar spur is seen in the calcaneus. There is interval increase in soft tissue swelling around the right ankle. IMPRESSION: No fracture or dislocation is seen. There is soft tissue swelling around the ankle. Plantar spur is seen in calcaneus. Electronically Signed   By: Elmer Picker M.D.   On: 11/29/2021 16:18   DG Abd 1 View  Result Date: 12/06/2021 CLINICAL DATA:  Constipation EXAM: ABDOMEN - 1 VIEW COMPARISON:  12/02/2021 FINDINGS: Enteric tube tip is at the distal stomach. Bowel gas pattern is unremarkable. Stool burden is mild. IMPRESSION: Unremarkable bowel gas pattern. Electronically Signed   By: Macy Mis M.D.   On: 12/06/2021 11:52   DG Abd 1 View  Result Date: 12/02/2021 CLINICAL DATA:  Nausea and vomiting EXAM: ABDOMEN - 1 VIEW COMPARISON:  11/16/2021 FINDINGS: Feeding tube tip in the right upper quadrant consistent with location in the distal stomach or possibly proximal duodenum. Bowel gas pattern is normal with scattered gas and stool throughout the colon. No small or large bowel distention. No radiopaque stones. Degenerative changes in the spine. IMPRESSION: Normal nonobstructive bowel gas pattern. Electronically Signed   By: Lucienne Capers M.D.   On: 12/02/2021 20:25   CT HEAD WO CONTRAST (5MM)  Result Date: 12/14/2021 CLINICAL DATA:  Altered mental status EXAM: CT HEAD WITHOUT CONTRAST TECHNIQUE: Contiguous axial images were obtained from the base of the skull through the vertex without intravenous contrast. RADIATION DOSE REDUCTION: This exam was performed according to the departmental dose-optimization program which includes automated exposure control, adjustment of the mA and/or kV according to patient size and/or use of iterative reconstruction technique. COMPARISON:  12/06/2021 FINDINGS: Brain: The brainstem, cerebellum, cerebral peduncles, thalami, basal ganglia, basilar cisterns, and ventricular system appear within normal limits. No intracranial hemorrhage, mass  lesion, or acute CVA. Vascular: There is atherosclerotic calcification of the cavernous carotid arteries bilaterally. Skull: Unremarkable Sinuses/Orbits: Unremarkable Other: Stable cutaneous thickening and subcutaneous stranding along the left posterior occipitoparietal scalp, present on 12/06/2021 but not on 11/08/2021, probably a scalp hematoma or contusion. IMPRESSION: 1. No acute intracranial findings. 2. Stable focal scalp lesion along the left posterior occipitoparietal region, possibly a scalp hematoma or contusion. 3. Atherosclerosis. Electronically Signed   By: Van Clines M.D.   On: 12/14/2021 13:07   CT HEAD WO CONTRAST (5MM)  Result Date: 12/06/2021 CLINICAL DATA:  Delirium. Status post cardiac arrest. Increasing confusion. EXAM: CT HEAD WITHOUT CONTRAST TECHNIQUE: Contiguous axial images were obtained from the base of the skull through the vertex without intravenous contrast. RADIATION DOSE REDUCTION: This exam was performed according to the departmental dose-optimization program which includes automated exposure control, adjustment of the mA and/or kV according to patient size and/or use of iterative reconstruction technique. COMPARISON:  CT brain 11/08/2021 FINDINGS: Brain: There is mild cortical atrophy, within normal limits for patient age. The ventricles are normal in configuration. The basilar cisterns are patent. No mass, mass effect, or midline shift. No acute intracranial hemorrhage is seen. No abnormal extra-axial fluid collection. There are atherosclerotic intracranial calcifications. Minimal periventricular white  matter hypodensities are similar to prior, likely chronic ischemic white matter changes. Preservation of the normal cortical gray-white interface without CT evidence of an acute major vascular territorial cortical based infarction. Vascular: No hyperdense vessel or unexpected calcification. Skull: Normal. Negative for fracture or focal lesion. Sinuses/Orbits: The  visualized orbits are unremarkable. The visualized paranasal sinuses and mastoid air cells are clear. Other: There is mildly increased density within the posterior inferior scalp at the level of the occiput in the region measuring up to 4 cm in transverse dimension and 3 cm in craniocaudal dimension, a possible contusion but nonspecific. IMPRESSION: No acute intracranial process. Increased density within the posterior inferior scalp at the level of the occiput in the region measuring up to 4 cm in transverse dimension and 3 cm in craniocaudal dimension, a possible contusion but nonspecific. This is new compared to 11/08/2021. Electronically Signed   By: Yvonne Kendall M.D.   On: 12/06/2021 15:20   DG Ankle Right Port  Result Date: 11/19/2021 CLINICAL DATA:  Trauma, pain EXAM: PORTABLE RIGHT ANKLE - 2 VIEW COMPARISON:  None. FINDINGS: No recent fracture or dislocation is seen. Small bony spurs are seen at the tip medial malleolus. Plantar spur is seen in calcaneus. Small bony spurs seen in the dorsal aspect of intertarsal joints. IMPRESSION: No recent fracture or dislocation is seen in the right ankle. There is moderate sized plantar spur in calcaneus. There are bony spurs at the tip of medial malleolus and dorsal aspect of intertarsal joints, suggesting degenerative arthritis. Electronically Signed   By: Elmer Picker M.D.   On: 11/19/2021 14:48   DG Abd Portable 1V  Result Date: 12/12/2021 CLINICAL DATA:  Feeding tube placement EXAM: PORTABLE ABDOMEN - 1 VIEW COMPARISON:  None. FINDINGS: Feeding tube in the mid to distal body of the stomach. Normal bowel gas pattern.  No abnormal calcifications. IMPRESSION: Feeding tube tip in the mid distal body of the stomach. Electronically Signed   By: Franchot Gallo M.D.   On: 12/12/2021 11:49   CT BONE MARROW BIOPSY & ASPIRATION  Result Date: 11/26/2021 INDICATION: 71 year old female with history of thrombocytopenia. EXAM: CT-GUIDED BONE MARROW BIOPSY AND  ASPIRATION MEDICATIONS: None ANESTHESIA/SEDATION: Fentanyl 25 mcg IV; Versed 1 mg IV Sedation Time: 10 minutes; The patient was continuously monitored during the procedure by the interventional radiology nurse under my direct supervision. COMPLICATIONS: None immediate. PROCEDURE: Informed consent was obtained from the patient following an explanation of the procedure, risks, benefits and alternatives. The patient understands, agrees and consents for the procedure. All questions were addressed. A time out was performed prior to the initiation of the procedure. The patient was positioned prone and non-contrast localization CT was performed of the pelvis to demonstrate the iliac marrow spaces. The operative site was prepped and draped in the usual sterile fashion. Under sterile conditions and local anesthesia, a 22 gauge spinal needle was utilized for procedural planning. Next, an 11 gauge coaxial bone biopsy needle was advanced into the left iliac marrow space. Needle position was confirmed with CT imaging. Initially, a bone marrow aspiration was performed. Next, a bone marrow biopsy was obtained with the 11 gauge outer bone marrow device. Samples were prepared with the cytotechnologist and deemed adequate. The needle was removed and superficial hemostasis was obtained with manual compression. A dressing was applied. The patient tolerated the procedure well without immediate post procedural complication. IMPRESSION: Successful CT guided left iliac bone marrow aspiration and core biopsy. Ruthann Cancer, MD [facility]  [facility] Radiology Electronically Signed   By: Ruthann Cancer M.D.   On: 11/26/2021 10:28    I spent more than 80 minutes for this patient encounter including review of prior medical records/discussing diagnostics and treatment plan with the patient/family/coordinate care with primary/other specialits with greater than 50% of time in face to face encounter.    Electronically signed by:   Rosiland Oz, MD Infectious Disease Physician [facility] for Infectious Disease Pager: [phone]

## 2021-12-18 NOTE — Progress Notes (Signed)
PHARMACY - PHYSICIAN COMMUNICATION CRITICAL VALUE ALERT - BLOOD CULTURE IDENTIFICATION (BCID)  [name] is an 71 y.o. female who was admitted to CIR from prolonged inpatient stay on 12/04/2021.  Assessment:  Pt on IV ABX for leukocytosis, changed 2/20 to vanc + ceftrixone, now growing Enterococcus faecalis in one blood cx set of two.  Name of physician (or Provider) Contacted: Will notify CIR MD in am.  Current antibiotics: ceftriaxone and vancomycin  Changes to prescribed antibiotics recommended:  Consider changing vanc to ampicillin.  Results for orders placed or performed during the hospital encounter of 12/04/21  Blood Culture ID Panel (Reflexed) (Collected: 12/17/2021  1:20 PM)  Result Value Ref Range   Enterococcus faecalis DETECTED (A) NOT DETECTED   Enterococcus Faecium NOT DETECTED NOT DETECTED   Listeria monocytogenes NOT DETECTED NOT DETECTED   Staphylococcus species NOT DETECTED NOT DETECTED   Staphylococcus aureus (BCID) NOT DETECTED NOT DETECTED   Staphylococcus epidermidis NOT DETECTED NOT DETECTED   Staphylococcus lugdunensis NOT DETECTED NOT DETECTED   Streptococcus species NOT DETECTED NOT DETECTED   Streptococcus agalactiae NOT DETECTED NOT DETECTED   Streptococcus pneumoniae NOT DETECTED NOT DETECTED   Streptococcus pyogenes NOT DETECTED NOT DETECTED   A.calcoaceticus-baumannii NOT DETECTED NOT DETECTED   Bacteroides fragilis NOT DETECTED NOT DETECTED   Enterobacterales NOT DETECTED NOT DETECTED   Enterobacter cloacae complex NOT DETECTED NOT DETECTED   Escherichia coli NOT DETECTED NOT DETECTED   Klebsiella aerogenes NOT DETECTED NOT DETECTED   Klebsiella oxytoca NOT DETECTED NOT DETECTED   Klebsiella pneumoniae NOT DETECTED NOT DETECTED   Proteus species NOT DETECTED NOT DETECTED   Salmonella species NOT DETECTED NOT DETECTED   Serratia marcescens NOT DETECTED NOT DETECTED   Haemophilus influenzae NOT DETECTED NOT DETECTED   Neisseria meningitidis NOT  DETECTED NOT DETECTED   Pseudomonas aeruginosa NOT DETECTED NOT DETECTED   Stenotrophomonas maltophilia NOT DETECTED NOT DETECTED   Candida albicans NOT DETECTED NOT DETECTED   Candida auris NOT DETECTED NOT DETECTED   Candida glabrata NOT DETECTED NOT DETECTED   Candida krusei NOT DETECTED NOT DETECTED   Candida parapsilosis NOT DETECTED NOT DETECTED   Candida tropicalis NOT DETECTED NOT DETECTED   Cryptococcus neoformans/gattii NOT DETECTED NOT DETECTED   Vancomycin resistance NOT DETECTED NOT DETECTED    Wynona Neat, PharmD, BCPS  12/18/2021  12:30 AM

## 2021-12-18 NOTE — Progress Notes (Signed)
PROGRESS NOTE   Subjective/Complaints:  Pt reports doesn't like breakfast this AM.  Appears more awake this AM. Not as confused per nursing.    ROS: Limited by cognition   Objective:   No results found. Recent Labs    12/18/21 0644 12/18/21 0832  WBC 15.5* 16.0*  HGB 8.1* 8.0*  HCT 24.6* 25.3*  PLT 17* 17*   Recent Labs    12/17/21 0531 12/18/21 0537  NA 131* 133*  K 4.6 4.3  CL 95* 96*  CO2 24 28  GLUCOSE 356* 343*  BUN 62* 28*  CREATININE 3.81* 1.96*  CALCIUM 9.2 8.7*    Intake/Output Summary (Last 24 hours) at 12/18/2021 1023 Last data filed at 12/17/2021 1720 Gross per 24 hour  Intake 70 ml  Output 900 ml  Net -830 ml     Pressure Injury 12/04/21 Head Posterior Unstageable - Full thickness tissue loss in which the base of the injury is covered by slough (yellow, tan, gray, green or brown) and/or eschar (tan, brown or black) in the wound bed. 3 cm round, scabbed area to po (Active)  12/04/21 2000  Location: Head  Location Orientation: Posterior  Staging: Unstageable - Full thickness tissue loss in which the base of the injury is covered by slough (yellow, tan, gray, green or brown) and/or eschar (tan, brown or black) in the wound bed.  Wound Description (Comments): 3 cm round, scabbed area to posterior head.  Present on Admission: Yes    Physical Exam: Vital Signs Blood pressure 123/67, pulse 100, temperature 98.8 F (37.1 C), temperature source Oral, resp. rate 18, height 5\' 5"  (1.651 m), weight 64.4 kg, SpO2 99 %.    General: awake, alert, sitting up- NAD HENT: conjugate gaze; oropharynx dry- cortrak in nares CV: borderline tachycardic  rate; no JVD Pulmonary: CTA B/L; no W/R/R- good air movement GI: soft, NT, ND, (+)BS Psychiatric: appropriate- calmer, but still depressed Neurological: alert- more bright and interactive socially today Skin: unstageable wound with scabbing on back of head  3-4cm in diameter- also has popped/dry blister on L sternum- slough gone from base- -stable Genitourinary:    Comments: Foley tube in place- medium clear, yellow urine in bag- no change Musculoskeletal:     Cervical back: Neck supple. No tenderness.     Comments: UE 5-/5 B/L  LE 3/5 HF/KE- DF 4-/5 on L and 2-/5 on R; PF 4-/5 B/L  R foot drop- at rest in PF   Skin:    General: Skin is warm and dry. Wound on occiput/matted with hair    Comments: R chest HD catheter  Neuro: Mild tremors with movement. Reasonable sitting balance Numb/absent/decreased sensation from mid calf to toes B/L L>R Vague- Ox2 with cues   Assessment/Plan: 1. Functional deficits which require 3+ hours per day of interdisciplinary therapy in a comprehensive inpatient rehab setting. Physiatrist is providing close team supervision and 24 hour management of active medical problems listed below. Physiatrist and rehab team continue to assess barriers to discharge/monitor patient progress toward functional and medical goals  Care Tool:  Bathing    Body parts bathed by patient: Right arm, Left arm, Chest, Right upper leg, Left upper  leg, Face, Front perineal area, Abdomen, Buttocks   Body parts bathed by helper: Front perineal area, Buttocks, Right lower leg, Left lower leg     Bathing assist Assist Level: Minimal Assistance - Patient > 75%     Upper Body Dressing/Undressing Upper body dressing   What is the patient wearing?: Pull over shirt    Upper body assist Assist Level: Minimal Assistance - Patient > 75%    Lower Body Dressing/Undressing Lower body dressing      What is the patient wearing?: Pants     Lower body assist Assist for lower body dressing: Moderate Assistance - Patient 50 - 74%     Toileting Toileting Toileting Activity did not occur Landscape architect and hygiene only): N/A (no void or bm)  Toileting assist Assist for toileting: Maximal Assistance - Patient 25 - 49%      Transfers Chair/bed transfer  Transfers assist     Chair/bed transfer assist level: Dependent - mechanical lift (stedy)     Locomotion Ambulation   Ambulation assist   Ambulation activity did not occur: Safety/medical concerns          Walk 10 feet activity   Assist  Walk 10 feet activity did not occur: Safety/medical concerns        Walk 50 feet activity   Assist Walk 50 feet with 2 turns activity did not occur: Safety/medical concerns         Walk 150 feet activity   Assist Walk 150 feet activity did not occur: Safety/medical concerns         Walk 10 feet on uneven surface  activity   Assist Walk 10 feet on uneven surfaces activity did not occur: Safety/medical concerns         Wheelchair     Assist Is the patient using a wheelchair?: Yes Type of Wheelchair: Manual    Wheelchair assist level: Minimal Assistance - Patient > 75% Max wheelchair distance: 100'    Wheelchair 50 feet with 2 turns activity    Assist        Assist Level: Minimal Assistance - Patient > 75%   Wheelchair 150 feet activity     Assist      Assist Level: Dependent - Patient 0%   Blood pressure 123/67, pulse 100, temperature 98.8 F (37.1 C), temperature source Oral, resp. rate 18, height 5\' 5"  (1.651 m), weight 64.4 kg, SpO2 99 %.  Medical Problem List and Plan: 1. Functional deficits secondary to debility related to VF cardiac arrest secondary to massive pulmonary emboli status post mechanical thrombectomy             -patient may not shower due to HD catheter- unless it's removed             -ELOS/Goals: 10-14 days supervision to min A for PT/OT SLP  -con't CIR- Continue CIR- PT, OT and SLP  Continue CIR- PT, OT and SLP Family conference today at 11am- to discuss pt's care- and dispo  2/21- Con't CIR- PT, OT and SLP- team conference today 2.  Antithrombotics: -DVT/anticoagulation:  Pharmaceutical: Other (comment) Eliquis- has to use due  to saddle PE             -antiplatelet therapy: N/A 3. Pain Management: Neurontin 100 mg twice daily, oxycodone as needed  2/9- had been on gabapentin for nerve pain-will stop due to sedation- will start RLE lidoderm patch 2 patches for pain.   2/13  more alert off gabapentin in spite  of being on at home prior for neuropathy 4. Mood anxiety-  Cymbalta 20 mg daily, Xanax 0.25 mg daily as needed provide emotional support  2/21- change to Paxil 20 mg nightly-              -antipsychotic agents: N/A 5. Neuropsych: This patient is not capable of making decisions on her own behalf. 6. Skin/Wound Care: Routine skin checks 7. Fluids/Electrolytes/Nutrition: still not eating much  -continue TF for now.   -hopefully po intake will pick up as mentation improves.   2/20- family wants PEG_ pt kept saying doesn't- will d/w family tomorrow 2/21- family still wants PEG when possible 8.  Pericasular hepatic /hemorrhagic shock/acute blood loss anemia/status post left liver lobe embolization  Hgb down again to 7.7 repeat non contrast CT abd no change in pericapsular hepatic bleed , check stool OB 9.  AKI secondary to ATN.  Follow-up per renal services.  Started clip for AKI- not ESRD, however Cr almost 5 today- getting HD tomorrow 2/8.   2/9- last Cr 5.74- up from 4.97 up from 4.45- rising- per renal- likely HD tomorrow?.   2/16- Cr 3.36 and BUN 26- per renal 2/20- Cr rising- for HD today- per renal  2/21- Cr better however right after HD- -per renal 10.  Thrombocytopenia.  Suspect ITP.  Follow-up per hematology  2/13- plts 83k. Continue to monitor (has ranged between 60k and 110k)  2/16- Plts up to 139k  2/21- Plts were 214- down to 17k- will transfuse and cal Heme/Onc-  11.  Hyperlipidemia.  Lipitor 12. Diabetes- Poor appetite- has Cortrak for intake due to poor appetite-stop insulin- was on at home- levemir 4 units BID when came to rehab, but dropping CBGs- so will hold- will do CBGs BID Will assess if  still needs- d/c insulin due to Tfs  2/11- TF's restarted so CBGs going up- 486 this AM- restarted Levemir 4 units BID and SSI- will titrate as required.    CBG (last 3)  Recent Labs    12/18/21 0003 12/18/21 0402 12/18/21 0748  GLUCAP 377* 290* 258*  Increased Levimir to 10U q12h  Changed to moderate SSI q 4h since oral intake is low   2/20- CBGs 300s- but last in computer was noon- can't see if increase in levemir effective quite yet.   2/21- will increase to 14 units BID 13. New onset confusion/poor memory- anoxia? From prolonged CPR  2/9- will check CT of head- and stop Gabapentin for nerve pain due to increased sedation- had before but so out of it per daughter- much worse last 2 days.   2/15- variable -tangential and  poor memory- will need to d/w daughter  2/21- still confused, but acutely medically ill still- is improving 14. ABLA with chronic anemia  2/13 hb stable at 8.2  2/21- Hb stable at 8.0 15. Scab on back of head-pressure ulcer unstageable/Blister on sternum   2/9- on the back of her head  2/10- will need to have hair washed to see if we can see wound that's on CT  2/13 Sharon RN examined wound today --peroxide and mupirocin bid with dry dressing  2/15- touched base with nursing- stable- unstageable on back of head- blister healing 16. HTN with hx of VF arrest  2/10- pt's BP 178/95- running 161W/960A- systolic- will d/w renal since not on any BP meds-   2/11- Started norvasc and BP looks better 540 systolic- will con't to monitor trend.    Vitals:   12/17/21  1930 12/18/21 0322  BP: (!) 119/57 123/67  Pulse: 99 100  Resp: 18 18  Temp: 98.3 F (36.8 C) 98.8 F (37.1 C)  SpO2: 99% 99%    17. Constipation  2/20- LBM yesterday per nursing 18. Leukocytosis-UTI_ Cx pending  2/15- will remove foley and bladder scan; also check U/A and Cx- foley was per renal .   2/17- pt has UTI- started on Cefepime yesterday- also WBC up to 17k  2/18 WBC down to 13.2K , recheck in  am   2/20- WBC up to 18.3k- due to HD catheter and no improvement- actually worse, d/w ID and pharmacy- agreed to change cefepime to rocephin- also start Vanc and check blood Cx's.   2/21- changed to Ampicillin with ID discussion- off Vanc due to E fecalis  19. Fall  2/16- pt on floor last night- denied pain- CT head was ordered - pt refused to keep on mask to go downstairs- will get this AM.  20. Poor appetite-  2/17- will get PEG placed next week so can remove Cortrak- spoke with PA to get ordered  2/20- order in, but cannot do when ill/sick/  2/21- wait on PEG- con't cortrak  I spent a total of  1 hour  on total care today- >50% coordination of care- due to d/w ID, pharmacy team conference and calling Heme/Onc- also having to transfuse plts- called daughter and -ok'd to do plt transfusion- 25 minutes just on phone with daughter    LOS: 14 days A FACE TO [place] 12/18/2021, 10:23 AM

## 2021-12-18 NOTE — Progress Notes (Signed)
Dr. Grier Mitts nurse returned cal to state that Dr. Irene Limbo has been in office and apparently did not receive the text messages sent earlier. Recommendations noted --will d/c Eliquis and order platelet count.

## 2021-12-18 NOTE — Progress Notes (Signed)
Speech Language Pathology Daily Session Note  Patient Details  Name: [name] MRN: [id number] Date of Birth: 07-Jul-1951  Today's Date: 12/18/2021 SLP Individual Time: 1330-1400 SLP Individual Time Calculation (min): 30 min  Short Term Goals: Week 2: SLP Short Term Goal 1 (Week 2): Patient will demonstrate sustained attention to functional tasks for 5 minutes with Mod verbal cues for redirection. SLP Short Term Goal 2 (Week 2): Patient will initaite functional tasks with Mod A multimodal cues. SLP Short Term Goal 3 (Week 2): Patient will demonstrate orientation to place, time and situation with Mod verbal and visual cues. SLP Short Term Goal 4 (Week 2): Patient will identify 2 cognitive and 2 physical deficits with Mod multimodal cues. SLP Short Term Goal 5 (Week 2): Patient will consume current diet with minimal overt s/s of aspiration with Min verbal cues for use of swallowing compensatory strategies.  Skilled Therapeutic Interventions:Skilled ST services focused on cognitive skills. Pt was asleep upon entering room but was agreeable to participate in session. SLP facilitated basic problem solving, error awareness and sustained attention in 4 step ADL picture sequence task. Pt demonstrated 80% accuracy with no effective carryover of verbally listing steps prior to functional sequencing, but pt required reduced cues in correcting errors from min A fade to supervision A verbal cues. Pt then received a phone call and requested to take the call and end services due to fatigue. Pt missed 15 minutes of treatment. Pt was left in room with call bell within reach and bed alarm set. SLP recommends to continue skilled services.     Pain Pain Assessment Pain Score: 0-No pain  Therapy/Group: Individual Therapy  Porscha Axley  [facility] 12/18/2021, 5:06 PM

## 2021-12-18 NOTE — Progress Notes (Signed)
Patient ID: [name], female   DOB: [birth year], 71 y.o.   MRN: [id number]  SW left message for pt dr Elton Sin [phone]) requesting return phone call to discuss pt current situation, and inquire if they were able to come up with preferred SNF locations.   *SW spoke with pt dtr [name] to discuss above. She already spoke with attending regarding her mother's situation. Encouraged her ot follow-up with the nursing staff to check status on if her mother will transition from this unit. SW emailed her SNF list as she indicated her sister had the list. States she will be back here later in the week so she can view some of the SNFs.    [name], MSW, [place] Office: [phone] Cell: [phone] Fax: [phone]

## 2021-12-18 NOTE — Progress Notes (Signed)
Face to face notified Dan PA of pt critical platelet value. Lab recollecting. Sheela Stack, LPN

## 2021-12-18 NOTE — Progress Notes (Signed)
Dan notified of second critical platelet value. No orders at this time. Sheela Stack, LPN

## 2021-12-18 NOTE — Progress Notes (Signed)
Occupational Therapy Session Note  Patient Details  Name: [name] MRN: [id number] Date of Birth: 1950-11-19  Today's Date: 12/18/2021 OT Individual Time: 1030-1100 OT Individual Time Calculation (min): 30 min    Short Term Goals: Week 1:  OT Short Term Goal 1 (Week 1): PT will performed sit to stand with mod A of +1 in prep for clothing managment for toileting/ dressing OT Short Term Goal 1 - Progress (Week 1): Progressing toward goal OT Short Term Goal 2 (Week 1): Pt will don shirt with min A in unsupported seated position OT Short Term Goal 2 - Progress (Week 1): Progressing toward goal OT Short Term Goal 3 (Week 1): Pt will be oriented x4 with min cues consistently OT Short Term Goal 3 - Progress (Week 1): Progressing toward goal Week 2:  OT Short Term Goal 1 (Week 2): STG = LTG 2/2 LOS  Skilled Therapeutic Interventions/Progress Updates:  Patient met lying supine in bed. Initially undergoing bedside echo. Patient missed first 15 minutes of OT treatment session.  0/10 pain at rest and with activity. After echo patient in agreement with OT treatment session. Supine to EOB with supervision A for safety and HOB elevated. Patient attempting to exit bed several times before this writer instructed her to do so. Sit to stand with stedy and CGA from EOB positioned in lowest setting. Stedy used for toilet transfer for time management. Patient completed 3/3 parts of toileting task with assist for hygiene/clothing management using stedy. UB dressing with Min A and LB dressing with assist to thread BLE and hike over hips in standing with stedy. At sink patient completed hand hygiene in steady with assist for safety. Sit to stand in stedy to wc with assist as indicated above. Patient continues to be limited by impulsivity and decreased safety awareness. Session concluded with patient seated in wc with call bell within reach, belt alarm activated and all needs met.   Therapy  Documentation Precautions:  Precautions Precautions: Fall, Other (comment) Precaution Comments: cortrak; right leg discomfort - PTA Restrictions Weight Bearing Restrictions: No General:    Therapy/Group: Individual Therapy  Adonai Helzer R [place] 12/18/2021, 7:03 AM

## 2021-12-18 NOTE — Progress Notes (Signed)
Hematology consulted. Patient was being followed by Dr Marin Olp for thrombocytopenia. I have informed him of the need for rpt hematology f/u. On chart review-- multiple medications including vancomycin that can cause acute thrombocytopenia. Transfuse platelets for plt<10k or if bleeding. Avoid medications that can cause thrombocytopenia. Avoid NSAIDS Hold all blood thinners. Confirm platelet counts in citrate tube or manual count. Dr Marin Olp to follow.  [name]

## 2021-12-19 LAB — CBC WITH DIFFERENTIAL/PLATELET
Abs Immature Granulocytes: 0.15 10*3/uL — ABNORMAL HIGH (ref 0.00–0.07)
Basophils Absolute: 0.1 10*3/uL (ref 0.0–0.1)
Basophils Relative: 1 %
Eosinophils Absolute: 0.2 10*3/uL (ref 0.0–0.5)
Eosinophils Relative: 1 %
HCT: 24.7 % — ABNORMAL LOW (ref 36.0–46.0)
Hemoglobin: 8 g/dL — ABNORMAL LOW (ref 12.0–15.0)
Immature Granulocytes: 1 %
Lymphocytes Relative: 15 %
Lymphs Abs: 2.4 10*3/uL (ref 0.7–4.0)
MCH: 28.5 pg (ref 26.0–34.0)
MCHC: 32.4 g/dL (ref 30.0–36.0)
MCV: 87.9 fL (ref 80.0–100.0)
Monocytes Absolute: 1.4 10*3/uL — ABNORMAL HIGH (ref 0.1–1.0)
Monocytes Relative: 9 %
Neutro Abs: 12.2 10*3/uL — ABNORMAL HIGH (ref 1.7–7.7)
Neutrophils Relative %: 73 %
Platelets: 27 10*3/uL — CL (ref 150–400)
RBC: 2.81 MIL/uL — ABNORMAL LOW (ref 3.87–5.11)
RDW: 14.9 % (ref 11.5–15.5)
WBC: 16.4 10*3/uL — ABNORMAL HIGH (ref 4.0–10.5)
nRBC: 0 % (ref 0.0–0.2)

## 2021-12-19 LAB — PREPARE PLATELET PHERESIS
Unit division: 0
Unit division: 0

## 2021-12-19 LAB — GLUCOSE, CAPILLARY
Glucose-Capillary: 242 mg/dL — ABNORMAL HIGH (ref 70–99)
Glucose-Capillary: 275 mg/dL — ABNORMAL HIGH (ref 70–99)
Glucose-Capillary: 71 mg/dL (ref 70–99)
Glucose-Capillary: 113 mg/dL — ABNORMAL HIGH (ref 70–99)
Glucose-Capillary: 114 mg/dL — ABNORMAL HIGH (ref 70–99)
Glucose-Capillary: 297 mg/dL — ABNORMAL HIGH (ref 70–99)

## 2021-12-19 LAB — RENAL FUNCTION PANEL
Albumin: 3 g/dL — ABNORMAL LOW (ref 3.5–5.0)
Anion gap: 13 (ref 5–15)
BUN: 39 mg/dL — ABNORMAL HIGH (ref 8–23)
CO2: 27 mmol/L (ref 22–32)
Calcium: 9.2 mg/dL (ref 8.9–10.3)
Chloride: 95 mmol/L — ABNORMAL LOW (ref 98–111)
Creatinine, Ser: 2.64 mg/dL — ABNORMAL HIGH (ref 0.44–1.00)
GFR, Estimated: 19 mL/min — ABNORMAL LOW (ref >60)
Glucose, Bld: 226 mg/dL — ABNORMAL HIGH (ref 70–99)
Phosphorus: 4 mg/dL (ref 2.5–4.6)
Potassium: 4.1 mmol/L (ref 3.5–5.1)
Sodium: 135 mmol/L (ref 135–145)

## 2021-12-19 LAB — CULTURE, BLOOD (ROUTINE X 2)
Special Requests: ADEQUATE
Special Requests: ADEQUATE

## 2021-12-19 LAB — BPAM PLATELET PHERESIS
Blood Product Expiration Date: 202302222359
Blood Product Expiration Date: 202302222359
ISSUE DATE / TIME: 202302211552
ISSUE DATE / TIME: 202302212228
Unit Type and Rh: 5100
Unit Type and Rh: 6200

## 2021-12-19 LAB — PLATELET COUNT: Platelets: 28 10*3/uL — CL (ref 150–400)

## 2021-12-19 LAB — HEPARIN INDUCED PLATELET AB (HIT ANTIBODY): Heparin Induced Plt Ab: 0.104 OD (ref 0.000–0.400)

## 2021-12-19 LAB — SAVE SMEAR(SSMR), FOR PROVIDER SLIDE REVIEW

## 2021-12-19 MED ORDER — HEPARIN SODIUM (PORCINE) 1000 UNIT/ML IJ SOLN
INTRAMUSCULAR | Status: AC
  Filled 2021-12-19: qty 4

## 2021-12-19 MED ORDER — INSULIN ASPART 100 UNIT/ML IJ SOLN
3.0000 [IU] | Freq: Three times a day (TID) | INTRAMUSCULAR | Status: DC
  Administered 2021-12-19 – 2021-12-20 (×3): 3 [IU] via SUBCUTANEOUS

## 2021-12-19 NOTE — Progress Notes (Signed)
ID brief note  Remains afebrile, wbc is stable Platelets improving to 27. Oncology re-consulted  Cards notified for TEE, planned for Friday  IR was previously consulted for g tube placement; canceled due to low platelets Discussed with Nephrology about HD cath removal and exchange- will have to coordinate with his HD needs. Per  IR, ok for HD cath removal with plts above 20K  Continue IV ampicillin. E faecalis is amp S Fu repeat blood cultures   Rosiland Oz, MD Infectious Disease Physician [facility] for Infectious Disease [address] [place], [place] [address] Phone: [phone]   Fax: [phone]

## 2021-12-19 NOTE — Progress Notes (Addendum)
IR has been following this patient for possible G tube placement.   The procedure has been on hold due to fever, elevated WBC, and [place] meeting with family.   Per chart, ID was consulted yesterday due to bacteremia.  Blood cx is being repeated, pt's plt is also low at 27. Need to be above 50 for G tube placement.   Will re evaluated patient next week for possible G tube placement.  Please call IR for questions and concerns.   ADDENDUM Request received for [facility] removal.  IR team called the floor to bring patient down for the removal, team was informed that patient needs to go to HD today.  TDC will be removed tomorrow.  Please call IR for questions and concerns.    Armando Gang Lynne Takemoto PA-C 12/19/2021 11:46 AM

## 2021-12-19 NOTE — Progress Notes (Signed)
Patient alert/oriented to self, place and situation. Patient disoriented to time and had a bit of memory impairment, unable to recall certain events from through out the previous day. Patient stated "I was suppose to receive blood and they never gave me any". Writer informed patient she received 1 unit of platelets with day shift and just hung the second unit of platelets. This nurse educated the patient on her CBG being checked every 4 hr as patient stated "they (staff) haven't been checking my blood sugars". Patient in bed asleep with call bell within reach. Will continue with plan of care.

## 2021-12-19 NOTE — Progress Notes (Signed)
Physical Therapy Weekly Progress Note  Patient Details  Name: [name] MRN: [id number] Date of Birth: 1951/03/29  Beginning of progress report period: December 12, 2021 End of progress report period: December 19, 2021  Today's Date: 12/19/2021 PT Individual Time: 0800-0900 PT Individual Time Calculation (min): 60 min   Patient has met 0 of 1 short term goals.  STG were set to LTG due to ELOS due to pt's expected d/c to SNF. Pt is still pending d/c to SNF due to slow and inconsistent progress towards therapy goals. Pt remains severely limited by cognitive deficits including motor planning deficits, decreased initiation, decreased attention and self-limiting behaviors. Pt has also experienced a UTI over the past week and critical lab values limiting her ability to functionally participate in therapy sessions.  Patient continues to demonstrate the following deficits muscle weakness, decreased cardiorespiratoy endurance, decreased coordination, decreased initiation, decreased attention, decreased awareness, decreased problem solving, decreased safety awareness, decreased memory, and delayed processing, and decreased sitting balance, decreased standing balance, decreased postural control, and decreased balance strategies and therefore will continue to benefit from skilled PT intervention to increase functional independence with mobility.  Patient progressing toward long term goals..  Continue plan of care.  PT Short Term Goals Week 2:  PT Short Term Goal 1 (Week 2): =LTG due to ELOS PT Short Term Goal 1 - Progress (Week 2): Progressing toward goal Week 3:  PT Short Term Goal 1 (Week 3): =LTG due to ELOS  Skilled Therapeutic Interventions/Progress Updates:    Pt received seated in bed finishing eating breakfast and receiving morning medications from nursing. Pt agreeable to participate in therapy session, no complaints of pain. Seated in bed to sitting EOB with Supervision and use of bedrail.  Assisted pt with donning socks and shoes. Sit to stand with mod A to RW due to increased posterior lean this date. Stand pivot transfer to w/c with RW and mod A. Pt exhibits unsafe behaviors with stand pivot transfers during session including attempting to sit before fully backing up to chair and fully turning towards chair without listening to verbal cueing for safety and sequencing. Ambulation 2 x 10 ft with RW and mod A for balance with close w/c follow for safety due to pt impulsivity regarding sitting. Attempted to cue pt for anterior weight shift and upright posture during gait but she continues to exhibit posterior lean in standing. Nustep level 3 x 5 min with use of B UE/LE for global endurance training and reciprocal movement. Pt reports some cramping in R HS this AM that is somewhat relieved via stretching on Nustep. Manual w/c propulsion x 50 ft with use of BUE at Supervision level with mod verbal cueing for correct propulsion technique for improved efficiency. Pt left seated in w/c in room with needs in reach in care of nursing and with daughter present.  Therapy Documentation Precautions:  Precautions Precautions: Fall, Other (comment) Precaution Comments: cortrak; right leg discomfort - PTA Restrictions Weight Bearing Restrictions: No      Therapy/Group: Individual Therapy   Excell Seltzer, PT, DPT, CSRS  12/19/2021, 5:48 PM

## 2021-12-19 NOTE — Progress Notes (Signed)
[facility] Progress Note   Subjective:  Seen on HD - 1L UFG and tolerating. No CP/dyspnea. C/o itching but otherwise feels fine. Plan is for [facility] removal tomorrow d/t bacteremia. She reports that urinated 3 times yesterday - Cr rising between HD, but less. Will continue to monitor labs after [facility] removed to determine timing for new catheter. Plts still low, but improved - hematology following again.  Objective Vitals:   12/18/21 2255 12/19/21 0141 12/19/21 0427 12/19/21 1320  BP: (!) 120/55 (!) 136/56 121/73 126/70  Pulse: (!) 101 99 (!) 108 91  Resp: 18 18 18    Temp: 98.9 F (37.2 C) (!) 97.5 F (36.4 C) 98.6 F (37 C)   TempSrc: Oral Oral    SpO2: 98% 100% 100%   Weight:      Height:       Physical Exam General: Well appearing woman, NAD. Room air. NG tube in place/clamped. Heart: RRR; no murmur Lungs: CTAB; no rales or wheezing Extremities: No LE edema Dialysis Access: TDC in R chest  Additional Objective Labs: Basic Metabolic Panel: Recent Labs  Lab 12/15/21 0641 12/16/21 0709 12/17/21 0531 12/18/21 0537 12/19/21 0441  NA 128*  --  131* 133* 135  K 4.7  --  4.6 4.3 4.1  CL 93*  --  95* 96* 95*  CO2 26  --  24 28 27   GLUCOSE 603*   < > 356* 343* 226*  BUN 28*  --  62* 28* 39*  CREATININE 2.45*  --  3.81* 1.96* 2.64*  CALCIUM 8.5*  --  9.2 8.7* 9.2  PHOS 2.6  --   --  3.0 4.0   < > = values in this interval not displayed.   Liver Function Tests: Recent Labs  Lab 12/17/21 0531 12/18/21 0537 12/18/21 1121 12/19/21 0441  AST 33  --  41  --   ALT 37  --  48*  --   ALKPHOS 121  --  119  --   BILITOT 0.4  --  0.4  --   PROT 6.7  --  7.2  --   ALBUMIN 2.6* 2.6* 2.7* 3.0*   CBC: Recent Labs  Lab 12/15/21 0641 12/17/21 0531 12/18/21 0644 12/18/21 0832 12/19/21 0441 12/19/21 0442  WBC 13.2* 18.3* 15.5* 16.0* 16.4*  --   NEUTROABS 9.8* 15.7* 12.0* 11.7* 12.2*  --   HGB 7.7* 8.7* 8.1* 8.0* 8.0*  --   HCT 24.4* 26.4* 24.6* 25.3* 24.7*  --    MCV 90.0 88.3 87.9 88.5 87.9  --   PLT 169 214 17* 17* 27* 28*   Blood Culture    Component Value Date/Time   SDES BLOOD RIGHT HAND 12/17/2021 1320   SPECREQUEST  12/17/2021 1320    BOTTLES DRAWN AEROBIC AND ANAEROBIC Blood Culture adequate volume   CULT ENTEROCOCCUS FAECALIS (A) 12/17/2021 1320   REPTSTATUS 12/19/2021 FINAL 12/17/2021 1320   Studies/Results: ECHOCARDIOGRAM COMPLETE  Result Date: 12/18/2021    ECHOCARDIOGRAM REPORT   Patient Name:   [name] Date of Exam: 12/18/2021 Medical Rec #:  [id number]       Height:       65.0 in Accession #:    [id number]      Weight:       142.0 lb Date of Birth:  05/26/1951       BSA:          1.710 m Patient Age:    71 years  BP:           123/67 mmHg Patient Gender: F               HR:           97 bpm. Exam Location:  Inpatient Procedure: 2D Echo, Cardiac Doppler and Color Doppler Indications:    R78.87 BACTEREMIA  History:        Patient has prior history of Echocardiogram examinations, most                 recent 10/28/2021. Risk Factors:Hypertension, Diabetes and                 Dyslipidemia. CARDIAC ARREST 10/28/21.  Sonographer:    Beryle Beams Referring Phys: [id number] [place]  1. Left ventricular ejection fraction, by estimation, is 60 to 65%. The left ventricle has normal function. The left ventricle has no regional wall motion abnormalities. There is mild concentric left ventricular hypertrophy. Left ventricular diastolic parameters are consistent with Grade I diastolic dysfunction (impaired relaxation).  2. Right ventricular systolic function is normal. The right ventricular size is normal.  3. The mitral valve is normal in structure. No evidence of mitral valve regurgitation. No evidence of mitral stenosis.  4. The aortic valve is normal in structure. Aortic valve regurgitation is not visualized. No aortic stenosis is present.  5. The inferior vena cava is normal in size with greater than 50% respiratory  variability, suggesting right atrial pressure of 3 mmHg. Comparison(s): Prior images reviewed side by side. The right ventricular systolic function has improved. Conclusion(s)/Recommendation(s): No evidence of valvular vegetations on this transthoracic echocardiogram. Consider a transesophageal echocardiogram to exclude infective endocarditis if clinically indicated. FINDINGS  Left Ventricle: Left ventricular ejection fraction, by estimation, is 60 to 65%. The left ventricle has normal function. The left ventricle has no regional wall motion abnormalities. The left ventricular internal cavity size was normal in size. There is  mild concentric left ventricular hypertrophy. Left ventricular diastolic parameters are consistent with Grade I diastolic dysfunction (impaired relaxation). Right Ventricle: The right ventricular size is normal. No increase in right ventricular wall thickness. Right ventricular systolic function is normal. Left Atrium: Left atrial size was normal in size. Right Atrium: Right atrial size was normal in size. Pericardium: There is no evidence of pericardial effusion. Mitral Valve: The mitral valve is normal in structure. No evidence of mitral valve regurgitation. No evidence of mitral valve stenosis. Tricuspid Valve: The tricuspid valve is normal in structure. Tricuspid valve regurgitation is trivial. No evidence of tricuspid stenosis. Aortic Valve: The aortic valve is normal in structure. Aortic valve regurgitation is not visualized. No aortic stenosis is present. Aortic valve mean gradient measures 6.0 mmHg. Aortic valve peak gradient measures 11.0 mmHg. Aortic valve area, by VTI measures 2.56 cm. Pulmonic Valve: The pulmonic valve was normal in structure. Pulmonic valve regurgitation is not visualized. No evidence of pulmonic stenosis. Aorta: The aortic root is normal in size and structure. Venous: The inferior vena cava is normal in size with greater than 50% respiratory variability,  suggesting right atrial pressure of 3 mmHg. IAS/Shunts: No atrial level shunt detected by color flow Doppler.  LEFT VENTRICLE PLAX 2D LVIDd:         3.80 cm LVIDs:         2.20 cm LV PW:         1.30 cm LV IVS:        1.20 cm LVOT diam:  1.80 cm LV SV:         75 LV SV Index:   44 LVOT Area:     2.54 cm  LV Volumes (MOD) LV vol d, MOD A2C: 66.7 ml LV vol d, MOD A4C: 52.3 ml LV vol s, MOD A2C: 30.6 ml LV vol s, MOD A4C: 6.9 ml LV SV MOD A2C:     36.1 ml LV SV MOD A4C:     52.3 ml LV SV MOD BP:      45.8 ml RIGHT VENTRICLE             IVC RV S prime:     18.40 cm/s  IVC diam: 1.80 cm TAPSE (M-mode): 2.0 cm LEFT ATRIUM           Index        RIGHT ATRIUM           Index LA diam:      3.10 cm 1.81 cm/m   RA Area:     14.80 cm LA Vol (A2C): 27.5 ml 16.08 ml/m  RA Volume:   34.80 ml  20.35 ml/m  AORTIC VALVE                     PULMONIC VALVE AV Area (Vmax):    1.99 cm      PV Vmax:       0.88 m/s AV Area (Vmean):   2.26 cm      PV Vmean:      59.900 cm/s AV Area (VTI):     2.56 cm      PV VTI:        0.170 m AV Vmax:           166.00 cm/s   PV Peak grad:  3.1 mmHg AV Vmean:          109.000 cm/s  PV Mean grad:  2.0 mmHg AV VTI:            0.294 m AV Peak Grad:      11.0 mmHg AV Mean Grad:      6.0 mmHg LVOT Vmax:         130.00 cm/s LVOT Vmean:        96.900 cm/s LVOT VTI:          0.296 m LVOT/AV VTI ratio: 1.01  AORTA Ao Root diam: 2.70 cm Ao Asc diam:  2.90 cm MITRAL VALVE               TRICUSPID VALVE MV Area (PHT): 3.27 cm    TR Peak grad:   19.9 mmHg MV Decel Time: 232 msec    TR Vmax:        223.00 cm/s MV E velocity: 67.80 cm/s MV A velocity: 94.60 cm/s  SHUNTS MV E/A ratio:  0.72        Systemic VTI:  0.30 m                            Systemic Diam: 1.80 cm Dani Gobble Croitoru MD Electronically signed by Sanda Klein MD Signature Date/Time: 12/18/2021/12:11:20 PM    Final    Medications:  sodium chloride Stopped (12/19/21 0145)   ampicillin (OMNIPEN) IV Stopped (12/19/21 7408)    ALPRAZolam  0.25 mg  Oral Daily   amLODipine  10 mg Oral Q2000   atorvastatin  20 mg Oral Daily   B-complex with vitamin C  1 tablet  Oral Daily   chlorhexidine  15 mL Mouth Rinse BID   Chlorhexidine Gluconate Cloth  6 each Topical Q0600   Chlorhexidine Gluconate Cloth  6 each Topical Q0600   dicyclomine  10 mg Oral TID AC   feeding supplement (NEPRO CARB STEADY)  237 mL Oral TID BM   feeding supplement (OSMOLITE 1.5 CAL)  975 mL Per Tube Q24H   feeding supplement (PROSource TF)  45 mL Per Tube BID   folic acid  2 mg Oral Daily   heparin sodium (porcine)  3,200 Units Intracatheter Once   insulin aspart  0-15 Units Subcutaneous Q4H   insulin aspart  3 Units Subcutaneous TID WC   insulin detemir  14 Units Subcutaneous BID   lidocaine  2 patch Transdermal Q24H   megestrol  800 mg Oral Daily   mupirocin cream  1 application Topical BID   pantoprazole  40 mg Oral BID   PARoxetine  20 mg Oral QHS   senna  2 tablet Oral Daily    Dialysis Orders: Establishing AKI orders. Has outpatient spot at [facility] TTS 2nd shift, will change schedule when getting closer to discharge.   Assessment/Plan: Severe AKI: felt d/t ischemic ATN in setting of cardiac arrest, shock, IV contrast. Underlying CKD with baseline Cr 1.3 in 2021. Dialysis started 10/31/2021 (CRRT initially). Urinated 3X yesterday - following. HD today, then [facility] removal (below). Will monitor labs/urine output to determine when to place new line.   V-fib cardiac arrest: Outside of hospital d/t PE (on admit).  Massive "saddle" PE s/p mechanical thrombectomy 10/29/21: Vascular US without DVT. Did have partial hypercoaguable work-up with + lupus anticoagulant. On Eliqius.   Thrombocytopenia: Plts down to 5K range, hematology was following. HIT negative. Suspected ITP s/p Nplate and steroids. S/p BMBx without malignancy. Was improving, but now with acute drop again 2/21 - possible d/t bacteremia - heme following again.   Retroperitoneal bleed/hemorrhagic shock/ABLA:  Originating from L liver lobe, s/p embolization 1/4 - required FFP, plts, Vit K, PRBCs; now stable.   Possible liver mass: On CT 10/28/21, s/p MRI 1/20 incompletely characterized.    Fever/Enterococcus bacteremia: Blood Cx 2/20 +. Now on IV ampicillin. Repeat Blood Cx pending. For line holiday - [facility] will be removed tomorrow.   Acute hypoxic respiratory failure (resolved): D/t #2, extubated 11/07/2021.   T2DM/hyperglycemia: Per primary team.   Encephalopathy: Likely multifactorial, improved/resolved.  Anemia (CKD, prior ABLA): Hgb 8. S/p 21U PRBCs this admit. Heme previously had not recommended ESA d/t likely hypercoagulable state.    Secondary hyperparathyroidism: CorrCa, Phos ok without binders. PTH 129 11/22/21 - no VDRA for now.   Nutrition: Alb low - ongoing bolus TF's and protein supplements. Considering PEG.   Debility/weakness: Currently in CIR for therapy.   Veneta Penton, PA-C 12/19/2021, 1:31 PM  [employer]

## 2021-12-19 NOTE — Progress Notes (Signed)
Physical Therapy Session Note  Patient Details  Name: [name] MRN: [id number] Date of Birth: [birth year]  Today's Date: 12/19/2021 PT Individual Time: 1108-1205 PT Individual Time Calculation (min): 57 min   Short Term Goals: Week 2:  PT Short Term Goal 1 (Week 2): =LTG due to ELOS  Skilled Therapeutic Interventions/Progress Updates: Pt presented in bed agreeable to therapy. Pt c/o R ankle pain, TTP  with rest breaks provided as needed throughout session. Pt performed supine to sit with minA and use of bed features. PTA donned shoes total A and set up with Stedy. Pt was able to pull self up with Stedy with minA and transferred to w/c. NT arrived for [facility] check (71). Pt encouraged to eat a snack before leaving room, pt did eat 1/2 a banana. Pt then transported to rehab gym and set up in parallel bars with PTA placing screen to decrease distractions. Pt participated in ambulation ~2f in parallel bars turning at one end. Pt noted to intermittently have posteiror lean which PTA was able to correct with facilitation at hips. After extended rest break pt was encouraged to try toe taps to 2in step in parallel bars however pt performed x 4 taps then began attempting step ups. Pt was able to complete x 5 step ups bilaterally. Noted that there was heavy reliance on BUE however pt did attempt to push through with BLE. Pt transported back to room and was able to perform ambulatory transfer w/c to EOB with RW and  modA overall. Once at EOB PTA doffed shoes and pt performed sit to supine with CGA. Pt repositioned to comfort and left with bed alarm on, call bell within reach and needs met.      Therapy Documentation Precautions:  Precautions Precautions: Fall, Other (comment) Precaution Comments: cortrak; right leg discomfort - PTA Restrictions Weight Bearing Restrictions: No General:   Vital Signs: Therapy Vitals Temp: 97.8 F (36.6 C) Pulse Rate: (!) 102 Resp: 18 BP: (!) 113/58 Pain: Pain  Assessment Pain Scale: 0-10 Pain Score: 0-No pain Mobility:   Locomotion :    Trunk/Postural Assessment :    Balance:   Exercises:   Other Treatments:      Therapy/Group: Individual Therapy  Marcene Laskowski 12/19/2021, 4:27 PM

## 2021-12-19 NOTE — Progress Notes (Signed)
I know Ms. Raper well.  Have not seen her for a couple weeks.  I actually checked her platelet count over the weekend and platelet count was 169,000.  I have no real idea as to why it dropped so quickly.  There is no blood smear for me to review as of yet.  She recently was diagnosed with Enterococcus bacteremia.  This could certainly cause thrombocytopenia.  I think she may have been transfused with platelets yesterday.  She is not bleeding as far as I can tell.  I would NOT transfuse her unless she has bleeding or her platelet count is less than 10,000.  She is still getting hemodialysis.  I need to make sure she is not getting any heparin with the hemodialysis.  Her BUN is 39 creatinine 2.64.  This is holding pretty steady for her.  Again, she has had no problems with her platelets throughout the stay in rehab.  There is that she was in the cardiac ICU, her platelet count has been trending upward.  I thought that her thrombocytopenia was secondary to this cardiac arrest that she had and low blood flow to the marrow.  Patient has she had a bone marrow biopsy done.  This was done on 11/26/2021.  She had adequate megakaryocytes in the bone marrow.  There is some dysplastic megakaryocytes.  Also, she has had this issue with the liver.  Her last MRI was done about a month ago.  Again, I cannot figure out why her platelet count would drop so quickly.  You always hope that this is some kind of a lab error.  Rarely do see a platelet count drop so quickly in 1 day.  Sometimes, with ITP, this can lead to profound thrombocytopenia.  She currently is on ampicillin for this Enterococcus.  For right now, I would just watch this thrombocytopenia.  Again, I would not transfuse her unless she is bleeding or her platelet count is less than 10,000.  We can always try Nplate on her.  I have given her steroids in the past.  I know this is exacerbated her blood sugars.  Again, this is quite unusual.  Her  platelet count was fine just 3 days ago.  I have to believe that the platelet count will recover.  We will follow along.  I really need to see a peripheral smear.  I will try to look at 1 tomorrow morning.  [name], MD  Darlyn Chamber 29:11

## 2021-12-19 NOTE — Progress Notes (Signed)
Speech Language Pathology Weekly Progress and Session Note  Patient Details  Name: [name] MRN: [id number] Date of Birth: [birth year]  Beginning of progress report period: December 14, 2021 End of progress report period: December 19, 2021  Today's Date: 12/19/2021 SLP Individual Time: 1033-1100 SLP Individual Time Calculation (min): 27 min  Short Term Goals: Week 2: SLP Short Term Goal 1 (Week 2): Patient will demonstrate sustained attention to functional tasks for 5 minutes with Mod verbal cues for redirection. SLP Short Term Goal 1 - Progress (Week 2): Met SLP Short Term Goal 2 (Week 2): Patient will initaite functional tasks with Mod A multimodal cues. SLP Short Term Goal 2 - Progress (Week 2): Met SLP Short Term Goal 3 (Week 2): Patient will demonstrate orientation to place, time and situation with Mod verbal and visual cues. SLP Short Term Goal 3 - Progress (Week 2): Met SLP Short Term Goal 4 (Week 2): Patient will identify 2 cognitive and 2 physical deficits with Mod multimodal cues. SLP Short Term Goal 4 - Progress (Week 2): Met SLP Short Term Goal 5 (Week 2): Patient will consume current diet with minimal overt s/s of aspiration with Min verbal cues for use of swallowing compensatory strategies. SLP Short Term Goal 5 - Progress (Week 2): Met    New Short Term Goals: Week 3: SLP Short Term Goal 1 (Week 3): Pt will demonstrated sustained attention in 5-7 minute intervals with min A verbal cues. SLP Short Term Goal 2 (Week 3): Pt will demonstrate basic problem solving skills with min A verbal cues. SLP Short Term Goal 3 (Week 3): Pt will demonstrate ability to self-monitor and recognize errors in functional tasks with mod A verbal cues. SLP Short Term Goal 4 (Week 3): Pt will demonstrated orientation x4 with min A verbal cues for visual aids. SLP Short Term Goal 5 (Week 3): Pt tolerate dys 3 textures and thin liquids with minimal overt s/s aspiration and supervision A verbal  cues for safe swallow strategies.  Weekly Progress Updates: Pt made good progress meeting 5 out 5 goals towards the end of the reporting period. Pt demonstrates fluctuating ability, however over the last two sessions demonstrated improvements in initiation, sustained attention, basic problem solving, error awareness and short term recall. Pt demonstrated increase sustained attention and overall awareness during PO consumption, with ability to upgrade to dys 3 textures in hopes to encourage PO intake with continued full supervision A. Pt's barriers to d/c continue to be impaired cognitive skills and reduced PO intake. Education is on going with family. Pt would continued to benefit from skilled ST services in order to maximize functional independence and reduce burden of care requiring 24 hour supervision A and continued ST services.     Intensity: Minumum of 1-2 x/day, 30 to 90 minutes Frequency: 3 to 5 out of 7 days Duration/Length of Stay: TBD due to possible SNF placement Treatment/Interventions: Cognitive remediation/compensation;Dysphagia/aspiration precaution training;Internal/external aids;Speech/Language facilitation;Cueing hierarchy;Environmental controls;Therapeutic Activities;Functional tasks;Patient/family education   Daily Session  Skilled Therapeutic Interventions: Skilled ST service focused on swallow skills. Pt's daughter and granddaughter were present for treatment session. Family had brought in regular and dys 3 texture snack (Chick-fil-a.) SLP facilitated PO consumption of regular textures (chicken nugget) and dys 3 textures (small hash brown.) Pt demonstrated impaired ability to tear off a piece of the chicken without upper dentures present and increased mastication time with both textures, but no overt s/s aspiration. SLP upgraded textures to dys 3 and thin liquids with continued  full supervision A, in order to encourage increase of PO intake. SLP educated pt and pt's family and  are in agreement of carb mod dys 3 diet to increase PO intake. Pt demonstrated sustained attention during treatment session up to 10 minutes and clarity of thought in simple conversation. Pt was left in room with family. Recommend to continue ST services.    General    Pain Pain Assessment Pain Scale: 0-10 Pain Score: 4  Pain Type: Acute pain Pain Location: Leg Pain Orientation: Right Pain Descriptors / Indicators: Aching;Sore Pain Frequency: Intermittent Pain Onset: On-going Pain Intervention(s): Medication (See eMAR)  Therapy/Group: Individual Therapy  Crosby Bevan  [facility] 12/19/2021, 11:50 AM

## 2021-12-19 NOTE — Progress Notes (Signed)
PROGRESS NOTE   Subjective/Complaints:  Pt reports R foot and leg is hurting- per nurse, about to give her pain meds.   Per NT< pt is eating a little better this AM  Per Heme/Onc - don't transfuse pt unless plts get to <10k- they stopped her Eliquis.   Thinking ITP possibly? Thye d/c;d her Eliquis.    ROS: Limited by cognition   Objective:   ECHOCARDIOGRAM COMPLETE  Result Date: 12/18/2021    ECHOCARDIOGRAM REPORT   Patient Name:   [name] Date of Exam: 12/18/2021 Medical Rec #:  [id number]       Height:       65.0 in Accession #:    [id number]      Weight:       142.0 lb Date of Birth:  1951/10/03       BSA:          1.710 m Patient Age:    71 years        BP:           123/67 mmHg Patient Gender: F               HR:           97 bpm. Exam Location:  Inpatient Procedure: 2D Echo, Cardiac Doppler and Color Doppler Indications:    R78.87 BACTEREMIA  History:        Patient has prior history of Echocardiogram examinations, most                 recent 10/28/2021. Risk Factors:Hypertension, Diabetes and                 Dyslipidemia. CARDIAC ARREST 10/28/21.  Sonographer:    Beryle Beams Referring Phys: [id number] [place]  1. Left ventricular ejection fraction, by estimation, is 60 to 65%. The left ventricle has normal function. The left ventricle has no regional wall motion abnormalities. There is mild concentric left ventricular hypertrophy. Left ventricular diastolic parameters are consistent with Grade I diastolic dysfunction (impaired relaxation).  2. Right ventricular systolic function is normal. The right ventricular size is normal.  3. The mitral valve is normal in structure. No evidence of mitral valve regurgitation. No evidence of mitral stenosis.  4. The aortic valve is normal in structure. Aortic valve regurgitation is not visualized. No aortic stenosis is present.  5. The inferior vena cava is normal in  size with greater than 50% respiratory variability, suggesting right atrial pressure of 3 mmHg. Comparison(s): Prior images reviewed side by side. The right ventricular systolic function has improved. Conclusion(s)/Recommendation(s): No evidence of valvular vegetations on this transthoracic echocardiogram. Consider a transesophageal echocardiogram to exclude infective endocarditis if clinically indicated. FINDINGS  Left Ventricle: Left ventricular ejection fraction, by estimation, is 60 to 65%. The left ventricle has normal function. The left ventricle has no regional wall motion abnormalities. The left ventricular internal cavity size was normal in size. There is  mild concentric left ventricular hypertrophy. Left ventricular diastolic parameters are consistent with Grade I diastolic dysfunction (impaired relaxation). Right Ventricle: The right ventricular size is normal. No increase in right ventricular wall thickness. Right ventricular systolic function is  normal. Left Atrium: Left atrial size was normal in size. Right Atrium: Right atrial size was normal in size. Pericardium: There is no evidence of pericardial effusion. Mitral Valve: The mitral valve is normal in structure. No evidence of mitral valve regurgitation. No evidence of mitral valve stenosis. Tricuspid Valve: The tricuspid valve is normal in structure. Tricuspid valve regurgitation is trivial. No evidence of tricuspid stenosis. Aortic Valve: The aortic valve is normal in structure. Aortic valve regurgitation is not visualized. No aortic stenosis is present. Aortic valve mean gradient measures 6.0 mmHg. Aortic valve peak gradient measures 11.0 mmHg. Aortic valve area, by VTI measures 2.56 cm. Pulmonic Valve: The pulmonic valve was normal in structure. Pulmonic valve regurgitation is not visualized. No evidence of pulmonic stenosis. Aorta: The aortic root is normal in size and structure. Venous: The inferior vena cava is normal in size with greater  than 50% respiratory variability, suggesting right atrial pressure of 3 mmHg. IAS/Shunts: No atrial level shunt detected by color flow Doppler.  LEFT VENTRICLE PLAX 2D LVIDd:         3.80 cm LVIDs:         2.20 cm LV PW:         1.30 cm LV IVS:        1.20 cm LVOT diam:     1.80 cm LV SV:         75 LV SV Index:   44 LVOT Area:     2.54 cm  LV Volumes (MOD) LV vol d, MOD A2C: 66.7 ml LV vol d, MOD A4C: 52.3 ml LV vol s, MOD A2C: 30.6 ml LV vol s, MOD A4C: 6.9 ml LV SV MOD A2C:     36.1 ml LV SV MOD A4C:     52.3 ml LV SV MOD BP:      45.8 ml RIGHT VENTRICLE             IVC RV S prime:     18.40 cm/s  IVC diam: 1.80 cm TAPSE (M-mode): 2.0 cm LEFT ATRIUM           Index        RIGHT ATRIUM           Index LA diam:      3.10 cm 1.81 cm/m   RA Area:     14.80 cm LA Vol (A2C): 27.5 ml 16.08 ml/m  RA Volume:   34.80 ml  20.35 ml/m  AORTIC VALVE                     PULMONIC VALVE AV Area (Vmax):    1.99 cm      PV Vmax:       0.88 m/s AV Area (Vmean):   2.26 cm      PV Vmean:      59.900 cm/s AV Area (VTI):     2.56 cm      PV VTI:        0.170 m AV Vmax:           166.00 cm/s   PV Peak grad:  3.1 mmHg AV Vmean:          109.000 cm/s  PV Mean grad:  2.0 mmHg AV VTI:            0.294 m AV Peak Grad:      11.0 mmHg AV Mean Grad:      6.0 mmHg LVOT Vmax:  130.00 cm/s LVOT Vmean:        96.900 cm/s LVOT VTI:          0.296 m LVOT/AV VTI ratio: 1.01  AORTA Ao Root diam: 2.70 cm Ao Asc diam:  2.90 cm MITRAL VALVE               TRICUSPID VALVE MV Area (PHT): 3.27 cm    TR Peak grad:   19.9 mmHg MV Decel Time: 232 msec    TR Vmax:        223.00 cm/s MV E velocity: 67.80 cm/s MV A velocity: 94.60 cm/s  SHUNTS MV E/A ratio:  0.72        Systemic VTI:  0.30 m                            Systemic Diam: 1.80 cm Mihai Croitoru MD Electronically signed by Sanda Klein MD Signature Date/Time: 12/18/2021/12:11:20 PM    Final    Recent Labs    12/18/21 4098 12/19/21 0441 12/19/21 0442  WBC 16.0* 16.4*  --   HGB 8.0*  8.0*  --   HCT 25.3* 24.7*  --   PLT 17* 27* 28*   Recent Labs    12/18/21 0537 12/19/21 0441  NA 133* 135  K 4.3 4.1  CL 96* 95*  CO2 28 27  GLUCOSE 343* 226*  BUN 28* 39*  CREATININE 1.96* 2.64*  CALCIUM 8.7* 9.2    Intake/Output Summary (Last 24 hours) at 12/19/2021 0944 Last data filed at 12/19/2021 0700 Gross per 24 hour  Intake 989 ml  Output --  Net 989 ml     Pressure Injury 12/04/21 Head Posterior Unstageable - Full thickness tissue loss in which the base of the injury is covered by slough (yellow, tan, gray, green or brown) and/or eschar (tan, brown or black) in the wound bed. 3 cm round, scabbed area to po (Active)  12/04/21 2000  Location: Head  Location Orientation: Posterior  Staging: Unstageable - Full thickness tissue loss in which the base of the injury is covered by slough (yellow, tan, gray, green or brown) and/or eschar (tan, brown or black) in the wound bed.  Wound Description (Comments): 3 cm round, scabbed area to posterior head.  Present on Admission: Yes    Physical Exam: Vital Signs Blood pressure 121/73, pulse (!) 108, temperature 98.6 F (37 C), resp. rate 18, height 5\' 5"  (1.651 m), weight 64.4 kg, SpO2 100 %.     General: awake, alert, appropriate, sitting up eating banana; nurse and NT in room; NAD HENT: conjugate gaze; oropharynx dry- cortrak in place CV: regular in high 90s's rate; no JVD Pulmonary: CTA B/L; no W/R/R- good air movement GI: soft, NT, ND, (+)BS Psychiatric: appropriate- calmer Neurological: Ox1-2- knows in hospital, but not why here- doesn't know year/time, etc  Skin: unstageable wound with scabbing on back of head 3-4cm in diameter- also has popped/dry blister on L sternum- slough gone from base- -stable  Musculoskeletal:     Cervical back: Neck supple. No tenderness.     Comments: UE 5-/5 B/L  LE 3/5 HF/KE- DF 4-/5 on L and 2-/5 on R; PF 4-/5 B/L  R foot drop- at rest in PF   Skin:    General: Skin is warm and  dry. Wound on occiput/matted with hair    Comments: R chest HD catheter  Neuro: Mild tremors with movement. Reasonable sitting balance Numb/absent/decreased sensation  from mid calf to toes B/L L>R Vague- Ox2 with cues   Assessment/Plan: 1. Functional deficits which require 3+ hours per day of interdisciplinary therapy in a comprehensive inpatient rehab setting. Physiatrist is providing close team supervision and 24 hour management of active medical problems listed below. Physiatrist and rehab team continue to assess barriers to discharge/monitor patient progress toward functional and medical goals  Care Tool:  Bathing    Body parts bathed by patient: Right arm, Left arm, Chest, Right upper leg, Left upper leg, Face, Front perineal area, Abdomen, Buttocks   Body parts bathed by helper: Front perineal area, Buttocks, Right lower leg, Left lower leg     Bathing assist Assist Level: Minimal Assistance - Patient > 75%     Upper Body Dressing/Undressing Upper body dressing   What is the patient wearing?: Pull over shirt    Upper body assist Assist Level: Minimal Assistance - Patient > 75%    Lower Body Dressing/Undressing Lower body dressing      What is the patient wearing?: Pants     Lower body assist Assist for lower body dressing: Moderate Assistance - Patient 50 - 74%     Toileting Toileting Toileting Activity did not occur Landscape architect and hygiene only): N/A (no void or bm)  Toileting assist Assist for toileting: Maximal Assistance - Patient 25 - 49%     Transfers Chair/bed transfer  Transfers assist     Chair/bed transfer assist level: Dependent - mechanical lift (stedy)     Locomotion Ambulation   Ambulation assist   Ambulation activity did not occur: Safety/medical concerns  Assist level: 2 helpers Assistive device: Parallel bars Max distance: 10'   Walk 10 feet activity   Assist  Walk 10 feet activity did not occur: Safety/medical  concerns  Assist level: 2 helpers Assistive device: Parallel bars   Walk 50 feet activity   Assist Walk 50 feet with 2 turns activity did not occur: Safety/medical concerns         Walk 150 feet activity   Assist Walk 150 feet activity did not occur: Safety/medical concerns         Walk 10 feet on uneven surface  activity   Assist Walk 10 feet on uneven surfaces activity did not occur: Safety/medical concerns         Wheelchair     Assist Is the patient using a wheelchair?: Yes Type of Wheelchair: Manual    Wheelchair assist level: Minimal Assistance - Patient > 75% Max wheelchair distance: 100'    Wheelchair 50 feet with 2 turns activity    Assist        Assist Level: Minimal Assistance - Patient > 75%   Wheelchair 150 feet activity     Assist      Assist Level: Dependent - Patient 0%   Blood pressure 121/73, pulse (!) 108, temperature 98.6 F (37 C), resp. rate 18, height 5\' 5"  (1.651 m), weight 64.4 kg, SpO2 100 %.  Medical Problem List and Plan: 1. Functional deficits secondary to debility related to VF cardiac arrest secondary to massive pulmonary emboli status post mechanical thrombectomy             -patient may not shower due to HD catheter- unless it's removed             -ELOS/Goals: 10-14 days supervision to min A for PT/OT SLP  -con't CIR- Continue CIR- PT, OT and SLP  Continue CIR- PT, OT and SLP Family conference  today at 11am- to discuss pt's care- and dispo 2/22- Con't CIR_ PT OT and SLP- trying to work around medical issues,  2.  Antithrombotics: -DVT/anticoagulation:  Pharmaceutical: Other (comment) Eliquis- has to use due to saddle PE             -antiplatelet therapy: N/A 3. Pain Management: Neurontin 100 mg twice daily, oxycodone as needed  2/9- had been on gabapentin for nerve pain-will stop due to sedation- will start RLE lidoderm patch 2 patches for pain.   2/13  more alert off gabapentin in spite of being on  at home prior for neuropathy  2/22- has oxy prn for RLE pain- using intermittently.  4. Mood anxiety-  Cymbalta 20 mg daily, Xanax 0.25 mg daily as needed provide emotional support  2/21- change to Paxil 20 mg nightly-              -antipsychotic agents: N/A 5. Neuropsych: This patient is not capable of making decisions on her own behalf. 6. Skin/Wound Care: Routine skin checks 7. Fluids/Electrolytes/Nutrition: still not eating much  -continue TF for now.   -hopefully po intake will pick up as mentation improves.   2/20- family wants PEG_ pt kept saying doesn't- will d/w family tomorrow 2/21- family still wants PEG when possible 8.  Pericasular hepatic /hemorrhagic shock/acute blood loss anemia/status post left liver lobe embolization  Hgb down again to 7.7 repeat non contrast CT abd no change in pericapsular hepatic bleed , check stool OB 9.  AKI secondary to ATN.  Follow-up per renal services.  Started clip for AKI- not ESRD, however Cr almost 5 today- getting HD tomorrow 2/8.   2/9- last Cr 5.74- up from 4.97 up from 4.45- rising- per renal- likely HD tomorrow?.   2/16- Cr 3.36 and BUN 26- per renal 2/20- Cr rising- for HD today- per renal  2/21- Cr better however right after HD- -per renal 10.  Thrombocytopenia.  Suspect ITP.  Follow-up per hematology  2/13- plts 83k. Continue to monitor (has ranged between 60k and 110k)  2/16- Plts up to 139k  2/21- Plts were 214- down to 17k- will transfuse and cal Heme/Onc- 2/22- plts up to 27k- this Am after transfusion- Heme/Onc not sure why drop in plts- however said no transfusion unless plts drop<10k  11.  Hyperlipidemia.  Lipitor 12. Diabetes- Poor appetite- has Cortrak for intake due to poor appetite-stop insulin- was on at home- levemir 4 units BID when came to rehab, but dropping CBGs- so will hold- will do CBGs BID Will assess if still needs- d/c insulin due to Tfs  2/11- TF's restarted so CBGs going up- 486 this AM- restarted Levemir 4  units BID and SSI- will titrate as required.    CBG (last 3)  Recent Labs    12/18/21 2318 12/19/21 0417 12/19/21 0752  GLUCAP 352* 242* 275*  Increased Levimir to 10U q12h  Changed to moderate SSI q 4h since oral intake is low   2/20- CBGs 300s- but last in computer was noon- can't see if increase in levemir effective quite yet.   2/21- will increase to 14 units BID  2/22- Cbgs still really elevated in 200s-300s- will add Novolog coverage per Dm coordinator 3 units at 8pm, midnight and 4am-  13. New onset confusion/poor memory- anoxia? From prolonged CPR  2/9- will check CT of head- and stop Gabapentin for nerve pain due to increased sedation- had before but so out of it per daughter- much worse last 2  days.   2/15- variable -tangential and  poor memory- will need to d/w daughter  2/21- still confused, but acutely medically ill still- is improving 14. ABLA with chronic anemia  2/13 hb stable at 8.2  2/21- Hb stable at 8.0 15. Scab on back of head-pressure ulcer unstageable/Blister on sternum   2/9- on the back of her head  2/10- will need to have hair washed to see if we can see wound that's on CT  2/13 [place] RN examined wound today --peroxide and mupirocin bid with dry dressing  2/15- touched base with nursing- stable- unstageable on back of head- blister healing 16. HTN with hx of VF arrest  2/10- pt's BP 178/95- running 299B/716R- systolic- will d/w renal since not on any BP meds-   2/11- Started norvasc and BP looks better 678 systolic- will con't to monitor trend.    Vitals:   12/19/21 0141 12/19/21 0427  BP: (!) 136/56 121/73  Pulse: 99 (!) 108  Resp: 18 18  Temp: (!) 97.5 F (36.4 C) 98.6 F (37 C)  SpO2: 100% 100%    17. Constipation  2/20- LBM yesterday per nursing 18. Leukocytosis-UTI/Bacteremia- Klebsiella and E Fecalis   2/15- will remove foley and bladder scan; also check U/A and Cx- foley was per renal .   2/17- pt has UTI- started on Cefepime yesterday- also  WBC up to 17k  2/18 WBC down to 13.2K , recheck in am   2/20- WBC up to 18.3k- due to HD catheter and no improvement- actually worse, d/w ID and pharmacy- agreed to change cefepime to rocephin- also start Vanc and check blood Cx's.   2/21- changed to Ampicillin with ID discussion- off Vanc due to E fecalis   2/22- WBC still 16.4- will check daily fo rnow 19. Fall  2/16- pt on floor last night- denied pain- CT head was ordered - pt refused to keep on mask to go downstairs- will get this AM.  20. Poor appetite-  2/17- will get PEG placed next week so can remove Cortrak- spoke with PA to get ordered  2/20- order in, but cannot do when ill/sick/  2/21- wait on PEG- con't cortrak  2/22- pt eating entire banana this AM- which is new  I spent a total of   39 minutes on total care today- >50% coordination of care- due to d/w nursing and NT as well as discussing with pharmacy and reading notes form Heme/Onc.    LOS: 15 days A FACE TO FACE EVALUATION WAS PERFORMED  Damon Baisch 12/19/2021, 9:44 AM

## 2021-12-19 NOTE — Progress Notes (Signed)
Occupational Therapy Session Note  Patient Details  Name: [name] MRN: [id number] Date of Birth: 1951/10/28  Today's Date: 12/19/2021 OT Individual Time: 340 317 [id number] OT Individual Time Calculation (min): 55 min    Short Term Goals: Week 2:  OT Short Term Goal 1 (Week 2): STG = LTG 2/2 LOS  Skilled Therapeutic Interventions/Progress Updates:  Pt greeted seated in w/c with daughter present, pt agreeable to OT intervention. Session focus on BADL reeducation, functional mobility, dynamic standing balance and decreasing overall caregiver burden. Pt request to complete wash up at sink. Overall pt completed bathing with MIN A. Pt sit<>stand at sink with Rw with MIN- MODA MOD verbal cues for hand placement during sit<>stand.pt completed UB dressing with MINA and MOD A for LB dressing with pt able to thread pants but needed assist to pull pants up to waist line in standing. Pt completed stand pivot to [facility] with MOD A, MOD A for 3/3 toileting tasks. Stand pivot to EOB with MOD A with RW.   pt left seated EOB with all needs within reach and family present.                       Therapy Documentation Precautions:  Precautions Precautions: Fall, Other (comment) Precaution Comments: cortrak; right leg discomfort - PTA Restrictions Weight Bearing Restrictions: No  Pain: no pain reported during session     Therapy/Group: Individual Therapy  Corinne Ports [facility] 12/19/2021, 10:29 AM

## 2021-12-19 NOTE — Progress Notes (Signed)
Nutrition Follow-up  DOCUMENTATION CODES:   Not applicable  INTERVENTION:  Continue Nepro Shake po TID, each supplement provides 425 kcal and 19 grams protein.   Continue nocturnal TF via Cortrak NGT using Osmolite 1.5 cal formula at rate of 75 ml/h x 13 hours (6pm-7am)   Continue Pro-source TF 45 ml BID per tube.    Nocturnal tube feeds provides 1543 kcal, 83 gm protein, 743 ml free water. This meets 79% of estimated calories needs and 83% of minimum estimated protein needs.  NUTRITION DIAGNOSIS:   Increased nutrient needs related to chronic illness (renal failure on HD) as evidenced by estimated needs; ongoing  GOAL:   Patient will meet greater than or equal to 90% of their needs; met  MONITOR:   PO intake, Supplement acceptance, Labs, Weight trends, TF tolerance, Skin, I & O's  REASON FOR ASSESSMENT:   Consult Calorie Count  ASSESSMENT:   71 year old female with history of asthma, diabetes mellitus, fibromyalgia, hypertension, hyperlipidemia. Presents after loss of consciousness. Pt with functional deficits secondary to debility related to VF cardiac arrest secondary to massive pulmonary emboli status post mechanical thrombectomy. Pt with renal failure secondary to ATN and hemodialysis initiated and awaiting plan for long-term hemodialysis. Admitted to CIR. Cortrak placed 1/20. Tip of tube in stomach.  Diet has been advanced. Meal completion has been 10-25%. Pt currently has Nepro shake ordered with varied consumption. PO intake continues to be poor. Cortrak NGT in place and continues to receive nocturnal tube feeds to aid in caloric and protein needs. Plans for G-tube placement next week. TDC to be removed today due to bacteremia.   Labs and medications reviewed.   Diet Order:   Diet Order             Diet Carb Modified Fluid consistency: Thin; Room service appropriate? Yes  Diet effective now                   EDUCATION NEEDS:   Not appropriate for  education at this time  Skin:  Skin Assessment: Reviewed RN Assessment Skin Integrity Issues:: Stage II, Other (Comment) Stage II: L sternum Other: MASD to buttocks, skin tear to buttock and R elbow  Last BM:  2/22  Height:   Ht Readings from Last 1 Encounters:  12/04/21 _0  (1.651 m)    Weight:   Wt Readings from Last 1 Encounters:  12/19/21 68.7 kg   BMI:  Body mass index is 25.2 kg/m.  Estimated Nutritional Needs:   Kcal:  1950-2150  Protein:  100-125 grams  Fluid:  1L + UOP  Corrin Parker, MS, RD, LDN RD pager number/after hours weekend pager number on Amion.

## 2021-12-20 ENCOUNTER — Inpatient Hospital Stay (HOSPITAL_COMMUNITY): Payer: Medicare PPO

## 2021-12-20 DIAGNOSIS — I2602 Saddle embolus of pulmonary artery with acute cor pulmonale: Secondary | ICD-10-CM

## 2021-12-20 HISTORY — PX: IR REMOVAL TUN CV CATH W/O FL: IMG2289

## 2021-12-20 LAB — GLUCOSE, CAPILLARY
Glucose-Capillary: 110 mg/dL — ABNORMAL HIGH (ref 70–99)
Glucose-Capillary: 126 mg/dL — ABNORMAL HIGH (ref 70–99)
Glucose-Capillary: 137 mg/dL — ABNORMAL HIGH (ref 70–99)
Glucose-Capillary: 222 mg/dL — ABNORMAL HIGH (ref 70–99)
Glucose-Capillary: 280 mg/dL — ABNORMAL HIGH (ref 70–99)
Glucose-Capillary: 344 mg/dL — ABNORMAL HIGH (ref 70–99)

## 2021-12-20 LAB — CBC WITH DIFFERENTIAL/PLATELET
Abs Immature Granulocytes: 0.14 10*3/uL — ABNORMAL HIGH (ref 0.00–0.07)
Abs Immature Granulocytes: 0.15 10*3/uL — ABNORMAL HIGH (ref 0.00–0.07)
Basophils Absolute: 0.1 10*3/uL (ref 0.0–0.1)
Basophils Absolute: 0.1 10*3/uL (ref 0.0–0.1)
Basophils Relative: 0 %
Basophils Relative: 1 %
Eosinophils Absolute: 0.2 10*3/uL (ref 0.0–0.5)
Eosinophils Absolute: 0.2 10*3/uL (ref 0.0–0.5)
Eosinophils Relative: 1 %
Eosinophils Relative: 1 %
HCT: 24.6 % — ABNORMAL LOW (ref 36.0–46.0)
HCT: 23.6 % — ABNORMAL LOW (ref 36.0–46.0)
Hemoglobin: 8.1 g/dL — ABNORMAL LOW (ref 12.0–15.0)
Hemoglobin: 7.6 g/dL — ABNORMAL LOW (ref 12.0–15.0)
Immature Granulocytes: 1 %
Immature Granulocytes: 1 %
Lymphocytes Relative: 12 %
Lymphocytes Relative: 17 %
Lymphs Abs: 1.8 10*3/uL (ref 0.7–4.0)
Lymphs Abs: 2.4 10*3/uL (ref 0.7–4.0)
MCH: 28.9 pg (ref 26.0–34.0)
MCH: 28 pg (ref 26.0–34.0)
MCHC: 32.9 g/dL (ref 30.0–36.0)
MCHC: 32.2 g/dL (ref 30.0–36.0)
MCV: 87.9 fL (ref 80.0–100.0)
MCV: 87.1 fL (ref 80.0–100.0)
Monocytes Absolute: 1.4 10*3/uL — ABNORMAL HIGH (ref 0.1–1.0)
Monocytes Absolute: 1.3 10*3/uL — ABNORMAL HIGH (ref 0.1–1.0)
Monocytes Relative: 9 %
Monocytes Relative: 9 %
Neutro Abs: 12 10*3/uL — ABNORMAL HIGH (ref 1.7–7.7)
Neutro Abs: 10.1 10*3/uL — ABNORMAL HIGH (ref 1.7–7.7)
Neutrophils Relative %: 77 %
Neutrophils Relative %: 71 %
Platelets: 17 10*3/uL — CL (ref 150–400)
Platelets: 33 10*3/uL — ABNORMAL LOW (ref 150–400)
RBC: 2.8 MIL/uL — ABNORMAL LOW (ref 3.87–5.11)
RBC: 2.71 MIL/uL — ABNORMAL LOW (ref 3.87–5.11)
RDW: 15 % (ref 11.5–15.5)
RDW: 14.8 % (ref 11.5–15.5)
WBC: 15.5 10*3/uL — ABNORMAL HIGH (ref 4.0–10.5)
WBC: 14.2 10*3/uL — ABNORMAL HIGH (ref 4.0–10.5)
nRBC: 0 % (ref 0.0–0.2)
nRBC: 0 % (ref 0.0–0.2)

## 2021-12-20 LAB — LACTATE DEHYDROGENASE: LDH: 279 U/L — ABNORMAL HIGH (ref 98–192)

## 2021-12-20 MED ORDER — INSULIN ASPART 100 UNIT/ML IJ SOLN
6.0000 [IU] | Freq: Three times a day (TID) | INTRAMUSCULAR | Status: DC
  Administered 2021-12-20 – 2021-12-31 (×25): 6 [IU] via SUBCUTANEOUS

## 2021-12-20 MED ORDER — CHLORHEXIDINE GLUCONATE 4 % EX LIQD
CUTANEOUS | Status: AC
  Filled 2021-12-20: qty 15

## 2021-12-20 MED ORDER — LIDOCAINE HCL 1 % IJ SOLN
INTRAMUSCULAR | Status: AC
  Filled 2021-12-20: qty 20

## 2021-12-20 MED ORDER — LIDOCAINE HCL 1 % IJ SOLN
INTRAMUSCULAR | Status: DC | PRN
  Administered 2021-12-20: 20 mL

## 2021-12-20 NOTE — Progress Notes (Addendum)
Physical Therapy Session Note  Patient Details  Name: [name] MRN: [id number] Date of Birth: [birth year]  Today's Date: 12/20/2021 PT Individual Time: 1005-1100 PT Individual Time Calculation (min): 55 min   Short Term Goals: Week 2:  PT Short Term Goal 1 (Week 2): =LTG due to ELOS PT Short Term Goal 1 - Progress (Week 2): Progressing toward goal  Skilled Therapeutic Interventions/Progress Updates: Pt presented in bed agreeable to therapy. Pt denies pain at start of session but did c/o R calf pain after ambulation, mildly TTP with nsg notified at end of session. Pt performed bed mobility with CGA and use of bed features. PTA donned shoes total A for time management. Performed stand pivot transfer to w/c with modA. Pt perseverating on hair, positioned pt at sink and she was able to put deep conditioner and brush front of hair. PTA assisted with some detangling at back of head. Pt then transported to rehab gym and performed stand pivot to high/low mat with RW and modA. Pt then participated in gait training with RW with pt ambulating 51f and 355frespectively with extended seated rest break between bouts. Pt ambulated with minA with intermittent posterior lean. Pt noted to have some increased tremors with fatigue. Pt transported to nsg station and was able to propel w/c with minA for general conditioning. Pt did require intermittent cues for increased use of LUE to maintain straight trajectory. Once in room pt performed ambulatory transfer to recliner with moCuberoPt left in recliner at end of session with belt alarm on, call bell within reach and needs met.      Therapy Documentation Precautions:  Precautions Precautions: Fall, Other (comment) Precaution Comments: cortrak; right leg discomfort - PTA Restrictions Weight Bearing Restrictions: No General:   Vital Signs: Therapy Vitals Temp: 98.5 F (36.9 C) Pulse Rate: 96 Resp: 18 BP: 104/64 Patient Position (if appropriate):  Lying Oxygen Therapy SpO2: 100 % Pain:   Mobility:   Locomotion :    Trunk/Postural Assessment :    Balance:   Exercises:   Other Treatments:      Therapy/Group: Individual Therapy  Agatha Duplechain 12/20/2021, 3:54 PM

## 2021-12-20 NOTE — Progress Notes (Signed)
Physical Therapy Session Note  Patient Details  Name: [name] MRN: [id number] Date of Birth: [birth year]  Today's Date: 12/20/2021 PT Individual Time: 1531-1556 PT Individual Time Calculation (min): 25 min   Short Term Goals: Week 2:  PT Short Term Goal 1 (Week 2): =LTG due to ELOS PT Short Term Goal 1 - Progress (Week 2): Progressing toward goal Week 3:  PT Short Term Goal 1 (Week 3): =LTG due to ELOS  Skilled Therapeutic Interventions/Progress Updates:    Patient received at sink with NT and rehab tech present, agreeable to PT. She initially denied pain. Patient ambulating ~20ft with RW and CGA with MinA to stand- wc follow. Patient reporting significant increase in R calf pain when ambulating limiting her distance completed. Upon assessment, patient able to achieve slightly less than neutral dorsiflexion, but did not report stretch or pain with PROM. PT squeezing calf and patient reported increase in pain. Short distance ambulatory transfer to recliner, seatbelt alarm on, call light within reach, RN aware of patient reports.   Therapy Documentation Precautions:  Precautions Precautions: Fall, Other (comment) Precaution Comments: cortrak; right leg discomfort - PTA Restrictions Weight Bearing Restrictions: No     Therapy/Group: Individual Therapy  Karoline Caldwell, PT, DPT, CBIS  12/20/2021, 8:35 AM

## 2021-12-20 NOTE — Progress Notes (Signed)
Occupational Therapy Weekly Progress Note  Patient Details  Name: DACIE MANDEL MRN: [id number] Date of Birth: 1951/06/04  Beginning of progress report period: December 05, 2021 End of progress report period: December 20, 2021  Pt has not met her short term goals but progressing towards LTGs.  Pt continues to present with impaired balance, decreased activity tolerance, generalized deconditioning, impaired cognition and increased medical complexity. Pt currently requires MIN A for bathing from sink level, MIN- MODA for sit<>stands and stand pivot toilet transfers with Rw, MIN A for UB ADLS and and MAX A for LB ADLs. Pt presents with impaired attention and awareness needing up to MOD instructional and environmental cues to orient to sessions. Pts family has been present during OT sessions, motivating pt and observing transfers and ADL reeducation.   Patient continues to demonstrate the following deficits: muscle weakness, decreased cardiorespiratoy endurance, impaired timing and sequencing, unbalanced muscle activation, motor apraxia, decreased coordination, and decreased motor planning, decreased initiation, decreased attention, decreased awareness, decreased problem solving, decreased safety awareness, decreased memory, and delayed processing, and decreased sitting balance, decreased standing balance, decreased postural control, and decreased balance strategies and therefore will continue to benefit from skilled OT intervention to enhance overall performance with BADL and Reduce care partner burden.  Patient progressing toward long term goals..  Continue plan of care.  OT Short Term Goals Week 1:  OT Short Term Goal 1 (Week 1): PT will performed sit to stand with mod A of +1 in prep for clothing managment for toileting/ dressing OT Short Term Goal 1 - Progress (Week 1): Progressing toward goal OT Short Term Goal 2 (Week 1): Pt will don shirt with min A in unsupported seated position OT Short Term  Goal 2 - Progress (Week 1): Progressing toward goal OT Short Term Goal 3 (Week 1): Pt will be oriented x4 with min cues consistently OT Short Term Goal 3 - Progress (Week 1): Progressing toward goal Week 2:  OT Short Term Goal 1 (Week 2): STG = LTG 2/2 LOS OT Short Term Goal 1 - Progress (Week 2): Not met Week 3:  OT Short Term Goal 1 (Week 3): pt will complete UB dressing with MIN A OT Short Term Goal 2 (Week 3): pt will recall correct hand placement for sit<>stand transfers for 3 consecutive OT sessions OT Short Term Goal 3 (Week 3): pt wil maintain sustained attention during 2 consecutive ADL sessions OT Short Term Goal 4 (Week 3): pt will complete dynamic balance tasks during ADLs with MIN A       Therapy Documentation Precautions:  Precautions Precautions: Fall, Other (comment) Precaution Comments: cortrak; right leg discomfort - PTA Restrictions Weight Bearing Restrictions: No    Therapy/Group: Individual Therapy  Corinne Ports [facility] 12/20/2021, 7:56 AM

## 2021-12-20 NOTE — Progress Notes (Signed)
[facility] Progress Note   Subjective:  Seen in room, no c/o's today.  Plts up 30k. Pt states she is voiding. 225 cc recorded this am.   Objective Vitals:   12/19/21 1701 12/19/21 1931 12/20/21 0310 12/20/21 0421  BP: 124/69 (!) 107/54 117/61   Pulse: 95 98 100   Resp: 18 18 18    Temp: 98.3 F (36.8 C) 98.6 F (37 C) 98.2 F (36.8 C)   TempSrc: Oral Oral Oral   SpO2: 96% 98% 98%   Weight:    65.4 kg  Height:       Physical Exam General: Well appearing woman, NAD. Room air. NG tube in place/clamped. Heart: RRR; no murmur Lungs: CTAB; no rales or wheezing Extremities: No LE edema Dialysis Access: TDC in R chest  Additional Objective Labs: Basic Metabolic Panel: Recent Labs  Lab 12/15/21 0641 12/16/21 0709 12/17/21 0531 12/18/21 0537 12/19/21 0441  NA 128*  --  131* 133* 135  K 4.7  --  4.6 4.3 4.1  CL 93*  --  95* 96* 95*  CO2 26  --  24 28 27   GLUCOSE 603*   < > 356* 343* 226*  BUN 28*  --  62* 28* 39*  CREATININE 2.45*  --  3.81* 1.96* 2.64*  CALCIUM 8.5*  --  9.2 8.7* 9.2  PHOS 2.6  --   --  3.0 4.0   < > = values in this interval not displayed.    Liver Function Tests: Recent Labs  Lab 12/17/21 0531 12/18/21 0537 12/18/21 1121 12/19/21 0441  AST 33  --  41  --   ALT 37  --  48*  --   ALKPHOS 121  --  119  --   BILITOT 0.4  --  0.4  --   PROT 6.7  --  7.2  --   ALBUMIN 2.6* 2.6* 2.7* 3.0*    CBC: Recent Labs  Lab 12/17/21 0531 12/18/21 0644 12/18/21 0832 12/19/21 0441 12/19/21 0442 12/20/21 0650  WBC 18.3* 15.5* 16.0* 16.4*  --  14.2*  NEUTROABS 15.7* 12.0* 11.7* 12.2*  --  10.1*  HGB 8.7* 8.1* 8.0* 8.0*  --  7.6*  HCT 26.4* 24.6* 25.3* 24.7*  --  23.6*  MCV 88.3 87.9 88.5 87.9  --  87.1  PLT 214 17* 17* 27* 28* 33*    Blood Culture    Component Value Date/Time   SDES BLOOD LEFT ANTECUBITAL 12/19/2021 0447   SPECREQUEST  12/19/2021 0447    BOTTLES DRAWN AEROBIC AND ANAEROBIC Blood Culture adequate volume   CULT   12/19/2021 0447    NO GROWTH 1 DAY Performed at [facility], [place] [address], [place], [place] [address]    REPTSTATUS PENDING 12/19/2021 [phone]   Studies/Results: No results found. Medications:  sodium chloride Stopped (12/19/21 0145)   ampicillin (OMNIPEN) IV 2 g (12/20/21 0904)    ALPRAZolam  0.25 mg Oral Daily   amLODipine  10 mg Oral Q2000   atorvastatin  20 mg Oral Daily   B-complex with vitamin C  1 tablet Oral Daily   chlorhexidine  15 mL Mouth Rinse BID   Chlorhexidine Gluconate Cloth  6 each Topical Q0600   dicyclomine  10 mg Oral TID AC   feeding supplement (NEPRO CARB STEADY)  237 mL Oral TID BM   feeding supplement (OSMOLITE 1.5 CAL)  975 mL Per Tube Q24H   feeding supplement (PROSource TF)  45 mL Per Tube BID  folic acid  2 mg Oral Daily   heparin sodium (porcine)  3,200 Units Intracatheter Once   insulin aspart  0-15 Units Subcutaneous Q4H   insulin aspart  6 Units Subcutaneous TID WC   insulin detemir  14 Units Subcutaneous BID   lidocaine  2 patch Transdermal Q24H   megestrol  800 mg Oral Daily   mupirocin cream  1 application Topical BID   pantoprazole  40 mg Oral BID   PARoxetine  20 mg Oral QHS   senna  2 tablet Oral Daily    Dialysis Orders: Establishing AKI orders. Has outpatient spot at [facility] TTS 2nd shift, will change schedule when getting closer to discharge.   Assessment/Plan: Severe AKI: HD dependent, felt d/t ischemic ATN in setting of cardiac arrest, shock, IV contrast. Underlying CKD with baseline Cr 1.3 in 2021. Dialysis started here 10/31/2021 (CRRT initially). Getting HD here on MWF schedule. Had HD yesterday. Due for [facility] removal today by IR. Will monitor labs/urine output to determine when to place new line.    Fever/Enterococcus bacteremia: Blood Cx 2/20 +. Now on IV ampicillin. Repeat Blood Cx negative. For line holiday, TDC to be removed today. ID following.  Thrombocytopenia: Plts down to 5K range, hematology was following. HIT  negative. Suspected ITP s/p Nplate and steroids. S/p BMBx without malignancy. Improved, then dropped again on 2/21 to 17. Seen by hematology, suspect due to acute infection. Plts up to 28 yest and 33k today. Per hematology smear review may indicate higher count ~50k.  V-fib cardiac arrest: Outside of hospital d/t PE (on admit).  Massive "saddle" PE s/p mechanical thrombectomy 10/29/21: Vascular US without DVT. Did have partial hypercoaguable work-up with + lupus anticoagulant. On Eliqius.  Retroperitoneal bleed/hemorrhagic shock/ABLA: Originating from L liver lobe, s/p embolization 1/4, also required FFP, plts, Vit K, PRBCs; now stable.  Acute hypoxic respiratory failure (resolved): D/t #2, extubated 11/07/2021.  T2DM/hyperglycemia: Per primary team.  Encephalopathy: Likely multifactorial, improved/resolved.  Anemia (CKD, prior ABLA): Hgb 8. S/p 21U PRBCs this admit. Heme previously had not recommended ESA d/t likely hypercoagulable state.    Secondary hyperparathyroidism: CorrCa, Phos ok without binders. PTH 129 11/22/21 - no VDRA for now.   Nutrition: Alb low - ongoing bolus TF's and protein supplements. Considering PEG.   Debility/weakness: Currently in CIR for therapy.   Kelly Splinter, MD 12/20/2021, 11:59 AM

## 2021-12-20 NOTE — Progress Notes (Signed)
[facility] Infectious Diseases Follow Up Note  Patient Identification: Patient Name: [name] MRN: [id number] Admit Date: 12/04/2021  6:47 PM Age: 71 y.o.Today's Date: 12/20/2021   Reason for Visit: Follow up on bacteremia   Principal Problem:   Debility Active Problems:   Cardiac arrest [facility])   Acute saddle pulmonary embolism with acute cor pulmonale (HCC)   Acute on chronic renal failure (HCC)   Anoxia of brain (HCC)  Antibiotics:  cefepime 1/1-1/7 Ceftriaxone 1/15-1/19,  2/20    vancomycin  1/1. 1/3. 1/5-1/9, 1/15-1/18, 2/20 Metronidazole 1/4-1/8 Meropenem 1/8-1/11   Lines/Hardware: RT IJ tunneled HD catheter   Interval Events: continues to be afebrile, Leukocytosis is downtrending, platelet count improving. Repeat blood cx 2/22 pending    Assessment 71 year old female with PMH of Anxiety/depression, Arthritis, Fibromyalgia, Asthma, DM type II, GERD, HLD, HTN who was transferred to rehab on 2/7 after hospital admission on 1/1 for prolonged out of hospital V-fib arrest secondary to massive pulmonary embolism with obstructive shock. Hospital course complicated by hemorrhagic shock 2/2 hepatic hemorrhage requiring IR embolization, requiring multiple transfusions, acute renal failure, possible recurrent aspiration PNA and thrombocytopenia (s/p N plate and dexamethasone for concerns of ITP) > Discharged to IP rehab. Found to have   High grade E faecalis bacteremia r/o endocarditis  Thrombocytopenia with previous concerns for ITP and consumptive coagulopathy and positive lupus anticoagulant - improving, hematology following   Leukocytosis  - improving   AKI in the setting of CKD - currently on iHD, making urine, may not need HD for long term per Nephrology   Large subcapsular lesion of inferior left lobe of liver (8.9*6cm) consistent with a resolving hematoma, cannot exclude malignancy complicated by hemorrhage -  Oncology following    Recommendations Continue IV ampicillin Fu repeat blood cultures 2/22 ( no growth in 1 day) TEE planned for 2/24 Planned for HD cath removal today, may not need HD early next week per Nephro Monitor CBC and BMP  Rest of the management as per the primary team. Thank you for the consult. Please page with pertinent questions or concerns.  ______________________________________________________________________ Subjective patient seen and examined at the bedside. She is working with PT and OT Doing well and no concerns   Vitals BP 117/61 (BP Location: Left Arm)    Pulse 100    Temp 98.2 F (36.8 C) (Oral)    Resp 18    Ht 5\' 5"  (1.651 m)    Wt 65.4 kg    SpO2 98%    BMI 23.99 kg/m     Physical Exam Constitutional:  sitting up in the bed holding a walker    Comments: dubhoff +  Cardiovascular:     Rate and Rhythm: Normal rate and regular rhythm.     Heart sounds:   Pulmonary:     Effort: Pulmonary effort is normal on room air     Comments:   Abdominal:     Palpations: Abdomen is soft.     Tenderness: non distended   Musculoskeletal:        General: No swelling or tenderness.   Skin:    Comments:   Neurological:     General: Grossly non focal   Pertinent Microbiology Results for orders placed or performed during the hospital encounter of 12/04/21  Urine Culture     Status: Abnormal   Collection Time: 12/13/21  9:40 AM   Specimen: Urine, Catheterized  Result Value Ref Range Status   Specimen Description URINE, CATHETERIZED  Final  Special Requests   Final    NONE Performed at [facility], [place] [address] [place], [place] [address]    Culture 20,000 COLONIES/mL KLEBSIELLA PNEUMONIAE (A)  Final   Report Status 12/15/2021 FINAL  Final   Organism ID, Bacteria KLEBSIELLA PNEUMONIAE (A)  Final      Susceptibility   Klebsiella pneumoniae - MIC*    AMPICILLIN RESISTANT Resistant     CEFAZOLIN <=4 SENSITIVE Sensitive     CEFEPIME <=0.12  SENSITIVE Sensitive     CEFTRIAXONE <=0.25 SENSITIVE Sensitive     CIPROFLOXACIN <=0.25 SENSITIVE Sensitive     GENTAMICIN <=1 SENSITIVE Sensitive     IMIPENEM <=0.25 SENSITIVE Sensitive     NITROFURANTOIN <=16 SENSITIVE Sensitive     TRIMETH/SULFA <=20 SENSITIVE Sensitive     AMPICILLIN/SULBACTAM 4 SENSITIVE Sensitive     PIP/TAZO <=4 SENSITIVE Sensitive     * 20,000 COLONIES/mL KLEBSIELLA PNEUMONIAE  Culture, blood (routine x 2)     Status: Abnormal   Collection Time: 12/17/21  9:05 AM   Specimen: BLOOD  Result Value Ref Range Status   Specimen Description BLOOD RIGHT ANTECUBITAL  Final   Special Requests   Final    BOTTLES DRAWN AEROBIC AND ANAEROBIC Blood Culture adequate volume   Culture  Setup Time   Final    GRAM POSITIVE COCCI IN PAIRS AEROBIC BOTTLE ONLY CRITICAL VALUE NOTED.  VALUE IS CONSISTENT WITH PREVIOUSLY REPORTED AND CALLED VALUE.    Culture (A)  Final    ENTEROCOCCUS FAECALIS SUSCEPTIBILITIES PERFORMED ON PREVIOUS CULTURE WITHIN THE LAST 5 DAYS. Performed at [facility], [place] [address] [place], [place] [address]    Report Status 12/19/2021 FINAL  Final  Culture, blood (routine x 2)     Status: Abnormal   Collection Time: 12/17/21  1:20 PM   Specimen: BLOOD RIGHT HAND  Result Value Ref Range Status   Specimen Description BLOOD RIGHT HAND  Final   Special Requests   Final    BOTTLES DRAWN AEROBIC AND ANAEROBIC Blood Culture adequate volume   Culture  Setup Time   Final    GRAM POSITIVE COCCI IN PAIRS IN BOTH AEROBIC AND ANAEROBIC BOTTLES CRITICAL RESULT CALLED TO, READ BACK BY AND VERIFIED WITH: V BRYK,[email or web address]  12/18/21 [place] Performed at [facility], [place] [address], [place], [place] [address]    Culture ENTEROCOCCUS FAECALIS (A)  Final   Report Status 12/19/2021 FINAL  Final   Organism ID, Bacteria ENTEROCOCCUS FAECALIS  Final      Susceptibility   Enterococcus faecalis - MIC*    AMPICILLIN <=2 SENSITIVE Sensitive     VANCOMYCIN 1  SENSITIVE Sensitive     GENTAMICIN SYNERGY SENSITIVE Sensitive     * ENTEROCOCCUS FAECALIS  Blood Culture ID Panel (Reflexed)     Status: Abnormal   Collection Time: 12/17/21  1:20 PM  Result Value Ref Range Status   Enterococcus faecalis DETECTED (A) NOT DETECTED Final    Comment: CRITICAL RESULT CALLED TO, READ BACK BY AND VERIFIED WITH: V BRYK,[email or web address]  12/18/21 [place]    Enterococcus Faecium NOT DETECTED NOT DETECTED Final   Listeria monocytogenes NOT DETECTED NOT DETECTED Final   Staphylococcus species NOT DETECTED NOT DETECTED Final   Staphylococcus aureus (BCID) NOT DETECTED NOT DETECTED Final   Staphylococcus epidermidis NOT DETECTED NOT DETECTED Final   Staphylococcus lugdunensis NOT DETECTED NOT DETECTED Final   Streptococcus species NOT DETECTED NOT DETECTED Final   Streptococcus agalactiae NOT DETECTED NOT DETECTED Final  Streptococcus pneumoniae NOT DETECTED NOT DETECTED Final   Streptococcus pyogenes NOT DETECTED NOT DETECTED Final   A.calcoaceticus-baumannii NOT DETECTED NOT DETECTED Final   Bacteroides fragilis NOT DETECTED NOT DETECTED Final   Enterobacterales NOT DETECTED NOT DETECTED Final   Enterobacter cloacae complex NOT DETECTED NOT DETECTED Final   Escherichia coli NOT DETECTED NOT DETECTED Final   Klebsiella aerogenes NOT DETECTED NOT DETECTED Final   Klebsiella oxytoca NOT DETECTED NOT DETECTED Final   Klebsiella pneumoniae NOT DETECTED NOT DETECTED Final   Proteus species NOT DETECTED NOT DETECTED Final   Salmonella species NOT DETECTED NOT DETECTED Final   Serratia marcescens NOT DETECTED NOT DETECTED Final   Haemophilus influenzae NOT DETECTED NOT DETECTED Final   Neisseria meningitidis NOT DETECTED NOT DETECTED Final   Pseudomonas aeruginosa NOT DETECTED NOT DETECTED Final   Stenotrophomonas maltophilia NOT DETECTED NOT DETECTED Final   Candida albicans NOT DETECTED NOT DETECTED Final   Candida auris NOT DETECTED NOT DETECTED Final   Candida glabrata  NOT DETECTED NOT DETECTED Final   Candida krusei NOT DETECTED NOT DETECTED Final   Candida parapsilosis NOT DETECTED NOT DETECTED Final   Candida tropicalis NOT DETECTED NOT DETECTED Final   Cryptococcus neoformans/gattii NOT DETECTED NOT DETECTED Final   Vancomycin resistance NOT DETECTED NOT DETECTED Final    Comment: Performed at [facility], [address], [place], [place] [address]    Pertinent Lab. CBC Latest Ref Rng & Units 12/20/2021 12/19/2021 12/19/2021  WBC 4.0 - 10.5 K/uL 14.2(H) - 16.4(H)  Hemoglobin 12.0 - 15.0 g/dL 7.6(L) - 8.0(L)  Hematocrit 36.0 - 46.0 % 23.6(L) - 24.7(L)  Platelets 150 - 400 K/uL 33(L) 28(LL) 27(LL)   CMP Latest Ref Rng & Units 12/19/2021 12/18/2021 12/17/2021  Glucose 70 - 99 mg/dL 226(H) 343(H) 356(H)  BUN 8 - 23 mg/dL 39(H) 28(H) 62(H)  Creatinine 0.44 - 1.00 mg/dL 2.64(H) 1.96(H) 3.81(H)  Sodium 135 - 145 mmol/L 135 133(L) 131(L)  Potassium 3.5 - 5.1 mmol/L 4.1 4.3 4.6  Chloride 98 - 111 mmol/L 95(L) 96(L) 95(L)  CO2 22 - 32 mmol/L 27 28 24   Calcium 8.9 - 10.3 mg/dL 9.2 8.7(L) 9.2  Total Protein 6.5 - 8.1 g/dL - 7.2 6.7  Total Bilirubin 0.3 - 1.2 mg/dL - 0.4 0.4  Alkaline Phos 38 - 126 U/L - 119 121  AST 15 - 41 U/L - 41 33  ALT 0 - 44 U/L - 48(H) 37     Pertinent Imaging today Plain films and CT images have been personally visualized and interpreted; radiology reports have been reviewed. Decision making incorporated into the Impression / Recommendations. No results found.   I spent more than 35 minutes for this patient encounter including review of prior medical records, coordination of care with primary/other specialist with greater than 50% of time being face to face/counseling and discussing diagnostics/treatment plan with the patient/family.  Electronically signed by:   Rosiland Oz, MD Infectious Disease Physician [facility] Pager: [phone]

## 2021-12-20 NOTE — Progress Notes (Signed)
SLP Cancellation Note  Patient Details Name: [name] MRN: [id number] DOB: 24-Mar-1951   Cancelled treatment:       Patient missed 45 minutes of skilled SLP intervention due to being off unit for a procedure, will re-attempt as able.  Weston Anna, MA, CCC-SLP                                                                                                 Lessie Manigo, [place] 12/20/2021, 1:35 PM

## 2021-12-20 NOTE — Progress Notes (Signed)
° ° °  [facility] has been requested to perform a transesophageal echocardiogram on 12/20/2021 for bacteremia.  After careful review of history and examination, the risks and benefits of transesophageal echocardiogram have been explained including risks of esophageal damage, perforation (1:10,000 risk), bleeding, pharyngeal hematoma as well as other potential complications associated with anesthesia including aspiration, arrhythmia, respiratory failure and death. Alternatives to treatment were discussed, questions were answered. Patient is willing to proceed.   71 yo female who arrived on 10/28/2020 with VF arrest in the setting of acute massive PE. Initial echo showed normal EF 70%, with RV dilatation and elevated PA pressure. Underwent mechanical thrombectomy by IR. Hospital course complicated by thrombocytopenia (ITP?), hepatic hemorrhage requiring multiple blood transfusion/FFP/platelet, AKI requiring HD, and now bacteremia. Repeat echo on 2/21 showed normal EF, RV returned to normal. Platelet dropped again on 2/21 from >200K down to 17K. Hematology and ID on board, hematology felt low platelet due to infection. Since 2/21, platelet slowing rising, 17 --> 17 --> 27 --> 28 --> 33. Per Dr. Dagoberto Ligas of inpatient rehab, patient is oriented x 2 but unable to make medical decision for herself. Daughter also endorse the fact patient cannot make medical decision at this time. Therefore will need to discuss with daughter to consent for TEE. Vital signs reviewed, normal BP, HR borderline high. Patient is anemia, with hgb around 7-8, hgb stable in the past few days. Cardiology consulted for TEE to rule endocarditis.   Dr. Marlou Porch to review platelet tomorrow morning, if platelet is still <50, will defer to MD whether to proceed with TEE or not.   Almyra Deforest, PA-C 12/20/2021 1:18 PM

## 2021-12-20 NOTE — Progress Notes (Signed)
We do not have any CBC back yet this morning.  I did look at her blood smear.  She appears to have a slightly higher level of platelets by her blood smear.  By my count, I would have to say that her platelet count would be about 50-60,000.  She has several large platelets.  White blood cells appear mature.  There is no nucleated red blood cells.  I see no schistocytes or spherocytes.  Again I have to believe that the thrombocytopenia is probably reflective of this Enterococcus infection that she has.  She may have some element of consumption or destruction.  She is on treatment for the Enterococcus.  There is been no bleeding.  Again, I would not transfuse her platelets unless her platelet count gets less than 10,000 or she is bleeding.  We will have to await the CBC.  Again, by my "rough" count on the blood smear, I would say that her platelet count is trending upward a little bit.  [name], MD

## 2021-12-20 NOTE — Progress Notes (Addendum)
Occupational Therapy Session Note  Patient Details  Name: [name] MRN: [id number] Date of Birth: 1950/11/06  Today's Date: 12/20/2021 OT Individual Time: 0800-0901 OT Individual Time Calculation (min): 61 min    Short Term Goals: Week 2:  OT Short Term Goal 1 (Week 2): STG = LTG 2/2 LOS OT Short Term Goal 1 - Progress (Week 2): Not met  Skilled Therapeutic Interventions/Progress Updates:  Pt greeted upright in bed with NT present with pt finishing breakfast, pt agreeable to OT intervention. Session focus on BADL reeducation, functional mobility, dynamic standing balance and decreasing overall caregiver burden. Pt completing self feeding with supervision. Pt noted to be intermittently frustrated about NT "coming in now when no ones been in here all morning." Oriented pt to NT needing to get her blood sugar, pt accepts response and returns to self feeding. Pt does also report frustration with pt having a hard time eating d/t not having dentures and cotrak being in her way. Validated pts frustration and reassured pt that there was plenty of time to eat and complete OT session. Pt completed supine>sit with CGA. Pt completed UB dressing with MIN A, MAX A for LB dressing via sit<>stand , pt needed MOD  cues for hand placement but able to stand with MIN A with RW. Pt pivoted to [facility] with MOD A for RW mgmt and safety, +urine void ( recorded in flowsheet), MoD A for 3/3 toileting tasks.  Did apply lotion to pts feet and lower legs with total A for time mgmt per pt request. pt left supine in bed with bed alarm activated and all needs within reach.                      Therapy Documentation Precautions:  Precautions Precautions: Fall, Other (comment) Precaution Comments: cortrak; right leg discomfort - PTA Restrictions Weight Bearing Restrictions: No  Pain: No pain reported during session    Therapy/Group: Individual Therapy  Corinne Ports [facility] 12/20/2021, 10:00 AM

## 2021-12-20 NOTE — Progress Notes (Signed)
PROGRESS NOTE   Subjective/Complaints:  Pt reports her R foot hurts- explained off gabapentin due to cognition- she was unhappy about this.   Said her daughter wants my number- will have nursing let them know can call floor to return calls.    ROS: Limited by cognition   Objective:   ECHOCARDIOGRAM COMPLETE  Result Date: 12/18/2021    ECHOCARDIOGRAM REPORT   Patient Name:   [name] Date of Exam: 12/18/2021 Medical Rec #:  [id number]       Height:       65.0 in Accession #:    [id number]      Weight:       142.0 lb Date of Birth:  January 18, 1951       BSA:          1.710 m Patient Age:    71 years        BP:           123/67 mmHg Patient Gender: F               HR:           97 bpm. Exam Location:  Inpatient Procedure: 2D Echo, Cardiac Doppler and Color Doppler Indications:    R78.87 BACTEREMIA  History:        Patient has prior history of Echocardiogram examinations, most                 recent 10/28/2021. Risk Factors:Hypertension, Diabetes and                 Dyslipidemia. CARDIAC ARREST 10/28/21.  Sonographer:    Beryle Beams Referring Phys: [id number] [place]  1. Left ventricular ejection fraction, by estimation, is 60 to 65%. The left ventricle has normal function. The left ventricle has no regional wall motion abnormalities. There is mild concentric left ventricular hypertrophy. Left ventricular diastolic parameters are consistent with Grade I diastolic dysfunction (impaired relaxation).  2. Right ventricular systolic function is normal. The right ventricular size is normal.  3. The mitral valve is normal in structure. No evidence of mitral valve regurgitation. No evidence of mitral stenosis.  4. The aortic valve is normal in structure. Aortic valve regurgitation is not visualized. No aortic stenosis is present.  5. The inferior vena cava is normal in size with greater than 50% respiratory variability, suggesting  right atrial pressure of 3 mmHg. Comparison(s): Prior images reviewed side by side. The right ventricular systolic function has improved. Conclusion(s)/Recommendation(s): No evidence of valvular vegetations on this transthoracic echocardiogram. Consider a transesophageal echocardiogram to exclude infective endocarditis if clinically indicated. FINDINGS  Left Ventricle: Left ventricular ejection fraction, by estimation, is 60 to 65%. The left ventricle has normal function. The left ventricle has no regional wall motion abnormalities. The left ventricular internal cavity size was normal in size. There is  mild concentric left ventricular hypertrophy. Left ventricular diastolic parameters are consistent with Grade I diastolic dysfunction (impaired relaxation). Right Ventricle: The right ventricular size is normal. No increase in right ventricular wall thickness. Right ventricular systolic function is normal. Left Atrium: Left atrial size was normal in size. Right Atrium: Right atrial size was  normal in size. Pericardium: There is no evidence of pericardial effusion. Mitral Valve: The mitral valve is normal in structure. No evidence of mitral valve regurgitation. No evidence of mitral valve stenosis. Tricuspid Valve: The tricuspid valve is normal in structure. Tricuspid valve regurgitation is trivial. No evidence of tricuspid stenosis. Aortic Valve: The aortic valve is normal in structure. Aortic valve regurgitation is not visualized. No aortic stenosis is present. Aortic valve mean gradient measures 6.0 mmHg. Aortic valve peak gradient measures 11.0 mmHg. Aortic valve area, by VTI measures 2.56 cm. Pulmonic Valve: The pulmonic valve was normal in structure. Pulmonic valve regurgitation is not visualized. No evidence of pulmonic stenosis. Aorta: The aortic root is normal in size and structure. Venous: The inferior vena cava is normal in size with greater than 50% respiratory variability, suggesting right atrial  pressure of 3 mmHg. IAS/Shunts: No atrial level shunt detected by color flow Doppler.  LEFT VENTRICLE PLAX 2D LVIDd:         3.80 cm LVIDs:         2.20 cm LV PW:         1.30 cm LV IVS:        1.20 cm LVOT diam:     1.80 cm LV SV:         75 LV SV Index:   44 LVOT Area:     2.54 cm  LV Volumes (MOD) LV vol d, MOD A2C: 66.7 ml LV vol d, MOD A4C: 52.3 ml LV vol s, MOD A2C: 30.6 ml LV vol s, MOD A4C: 6.9 ml LV SV MOD A2C:     36.1 ml LV SV MOD A4C:     52.3 ml LV SV MOD BP:      45.8 ml RIGHT VENTRICLE             IVC RV S prime:     18.40 cm/s  IVC diam: 1.80 cm TAPSE (M-mode): 2.0 cm LEFT ATRIUM           Index        RIGHT ATRIUM           Index LA diam:      3.10 cm 1.81 cm/m   RA Area:     14.80 cm LA Vol (A2C): 27.5 ml 16.08 ml/m  RA Volume:   34.80 ml  20.35 ml/m  AORTIC VALVE                     PULMONIC VALVE AV Area (Vmax):    1.99 cm      PV Vmax:       0.88 m/s AV Area (Vmean):   2.26 cm      PV Vmean:      59.900 cm/s AV Area (VTI):     2.56 cm      PV VTI:        0.170 m AV Vmax:           166.00 cm/s   PV Peak grad:  3.1 mmHg AV Vmean:          109.000 cm/s  PV Mean grad:  2.0 mmHg AV VTI:            0.294 m AV Peak Grad:      11.0 mmHg AV Mean Grad:      6.0 mmHg LVOT Vmax:         130.00 cm/s LVOT Vmean:        96.900 cm/s  LVOT VTI:          0.296 m LVOT/AV VTI ratio: 1.01  AORTA Ao Root diam: 2.70 cm Ao Asc diam:  2.90 cm MITRAL VALVE               TRICUSPID VALVE MV Area (PHT): 3.27 cm    TR Peak grad:   19.9 mmHg MV Decel Time: 232 msec    TR Vmax:        223.00 cm/s MV E velocity: 67.80 cm/s MV A velocity: 94.60 cm/s  SHUNTS MV E/A ratio:  0.72        Systemic VTI:  0.30 m                            Systemic Diam: 1.80 cm Sanda Klein MD Electronically signed by Sanda Klein MD Signature Date/Time: 12/18/2021/12:11:20 PM    Final    Recent Labs    12/19/21 0441 12/19/21 0442 12/20/21 0650  WBC 16.4*  --  14.2*  HGB 8.0*  --  7.6*  HCT 24.7*  --  23.6*  PLT 27* 28* 33*    Recent Labs    12/18/21 0537 12/19/21 0441  NA 133* 135  K 4.3 4.1  CL 96* 95*  CO2 28 27  GLUCOSE 343* 226*  BUN 28* 39*  CREATININE 1.96* 2.64*  CALCIUM 8.7* 9.2    Intake/Output Summary (Last 24 hours) at 12/20/2021 3888 Last data filed at 12/20/2021 0855 Gross per 24 hour  Intake 251.94 ml  Output 737 ml  Net -485.06 ml     Pressure Injury 12/04/21 Head Posterior Unstageable - Full thickness tissue loss in which the base of the injury is covered by slough (yellow, tan, gray, green or brown) and/or eschar (tan, brown or black) in the wound bed. 3 cm round, scabbed area to po (Active)  12/04/21 2000  Location: Head  Location Orientation: Posterior  Staging: Unstageable - Full thickness tissue loss in which the base of the injury is covered by slough (yellow, tan, gray, green or brown) and/or eschar (tan, brown or black) in the wound bed.  Wound Description (Comments): 3 cm round, scabbed area to posterior head.  Present on Admission: Yes    Physical Exam: Vital Signs Blood pressure 117/61, pulse 100, temperature 98.2 F (36.8 C), temperature source Oral, resp. rate 18, height 5\' 5"  (1.651 m), weight 65.4 kg, SpO2 98 %.      General: awake, alert, appropriate, sitting up in bed; breakfast just brought in;  NAD HENT: conjugate gaze; oropharynx moist; cortrak in place CV: regular rhythm; borderline tachycardic rate; no JVD Pulmonary: CTA B/L; no W/R/R- good air movement GI: soft, NT, ND, (+)BS Psychiatric: appropriate; still confused on timing of things Neurological: Ox1-2- no change today  Skin: unstageable wound with scabbing on back of head 3-4cm in diameter- also has popped/dry blister on L sternum- slough gone from base- -stable  Musculoskeletal:     Cervical back: Neck supple. No tenderness.     Comments: UE 5-/5 B/L  LE 3/5 HF/KE- DF 4-/5 on L and 2-/5 on R; PF 4-/5 B/L  R foot drop- at rest in PF   Skin:    General: Skin is warm and dry. Wound on  occiput/matted with hair    Comments: R chest HD catheter  Neuro: Mild tremors with movement. Reasonable sitting balance Numb/absent/decreased sensation from mid calf to toes B/L L>R Vague- Ox2 with cues  Assessment/Plan: 1. Functional deficits which require 3+ hours per day of interdisciplinary therapy in a comprehensive inpatient rehab setting. Physiatrist is providing close team supervision and 24 hour management of active medical problems listed below. Physiatrist and rehab team continue to assess barriers to discharge/monitor patient progress toward functional and medical goals  Care Tool:  Bathing    Body parts bathed by patient: Right arm, Left arm, Chest, Right upper leg, Left upper leg, Face, Front perineal area, Abdomen, Buttocks, Right lower leg, Left lower leg   Body parts bathed by helper: Front perineal area, Buttocks, Right lower leg, Left lower leg     Bathing assist Assist Level: Minimal Assistance - Patient > 75%     Upper Body Dressing/Undressing Upper body dressing   What is the patient wearing?: Pull over shirt    Upper body assist Assist Level: Minimal Assistance - Patient > 75%    Lower Body Dressing/Undressing Lower body dressing      What is the patient wearing?: Pants     Lower body assist Assist for lower body dressing: Moderate Assistance - Patient 50 - 74%     Toileting Toileting Toileting Activity did not occur (Clothing management and hygiene only): N/A (no void or bm)  Toileting assist Assist for toileting: Moderate Assistance - Patient 50 - 74%     Transfers Chair/bed transfer  Transfers assist     Chair/bed transfer assist level: Moderate Assistance - Patient 50 - 74%     Locomotion Ambulation   Ambulation assist   Ambulation activity did not occur: Safety/medical concerns  Assist level: 2 helpers Assistive device: Walker-rolling Max distance: 10'   Walk 10 feet activity   Assist  Walk 10 feet activity did not  occur: Safety/medical concerns  Assist level: 2 helpers Assistive device: Walker-rolling   Walk 50 feet activity   Assist Walk 50 feet with 2 turns activity did not occur: Safety/medical concerns         Walk 150 feet activity   Assist Walk 150 feet activity did not occur: Safety/medical concerns         Walk 10 feet on uneven surface  activity   Assist Walk 10 feet on uneven surfaces activity did not occur: Safety/medical concerns         Wheelchair     Assist Is the patient using a wheelchair?: Yes Type of Wheelchair: Manual    Wheelchair assist level: Supervision/Verbal cueing Max wheelchair distance: 57'    Wheelchair 50 feet with 2 turns activity    Assist        Assist Level: Supervision/Verbal cueing   Wheelchair 150 feet activity     Assist      Assist Level: Dependent - Patient 0%   Blood pressure 117/61, pulse 100, temperature 98.2 F (36.8 C), temperature source Oral, resp. rate 18, height 5\' 5"  (1.651 m), weight 65.4 kg, SpO2 98 %.  Medical Problem List and Plan: 1. Functional deficits secondary to debility related to VF cardiac arrest secondary to massive pulmonary emboli status post mechanical thrombectomy             -patient may not shower due to HD catheter- unless it's removed             -ELOS/Goals: 10-14 days supervision to min A for PT/OT SLP  -con't CIR- Continue CIR- PT, OT and SLP  Continue CIR- PT, OT and SLP Family conference today at 11am- to discuss pt's care- and dispo 2/23- Con't CIR- con't  PT, OT and SLP 2.  Antithrombotics: -DVT/anticoagulation:  Pharmaceutical: Other (comment) Eliquis- has to use due to saddle PE 2/23- stopped due to plts by Heme/Onc             -antiplatelet therapy: N/A 3. Pain Management: Neurontin 100 mg twice daily, oxycodone as needed  2/9- had been on gabapentin for nerve pain-will stop due to sedation- will start RLE lidoderm patch 2 patches for pain.   2/13  more alert off  gabapentin in spite of being on at home prior for neuropathy  2/22- has oxy prn for RLE pain- using intermittently.  4. Mood anxiety-  Cymbalta 20 mg daily, Xanax 0.25 mg daily as needed provide emotional support  2/21- change to Paxil 20 mg nightly-              -antipsychotic agents: N/A 5. Neuropsych: This patient is not capable of making decisions on her own behalf. 6. Skin/Wound Care: Routine skin checks 7. Fluids/Electrolytes/Nutrition: still not eating much  -continue TF for now.   -hopefully po intake will pick up as mentation improves.   2/20- family wants PEG_ pt kept saying doesn't- will d/w family tomorrow 2/21- family still wants PEG when possible  2/23- wil do once bacteremia addressed 8.  Pericasular hepatic /hemorrhagic shock/acute blood loss anemia/status post left liver lobe embolization  Hgb down again to 7.7 repeat non contrast CT abd no change in pericapsular hepatic bleed , check stool OB 9.  AKI secondary to ATN.  Follow-up per renal services.  Started clip for AKI- not ESRD, however Cr almost 5 today- getting HD tomorrow 2/8.   2/9- last Cr 5.74- up from 4.97 up from 4.45- rising- per renal- likely HD tomorrow?.   2/16- Cr 3.36 and BUN 26- per renal 2/20- Cr rising- for HD today- per renal  2/21- Cr better however right after HD- -per renal 10.  Thrombocytopenia.  Suspect ITP.  Follow-up per hematology  2/13- plts 83k. Continue to monitor (has ranged between 60k and 110k)  2/16- Plts up to 139k  2/21- Plts were 214- down to 17k- will transfuse and cal Heme/Onc- 2/22- plts up to 27k- this Am after transfusion- Heme/Onc not sure why drop in plts- however said no transfusion unless plts drop<10k  2.23- plts 33k this AM- doing better 11.  Hyperlipidemia.  Lipitor 12. Diabetes- Poor appetite- has Cortrak for intake due to poor appetite-stop insulin- was on at home- levemir 4 units BID when came to rehab, but dropping CBGs- so will hold- will do CBGs BID Will assess if  still needs- d/c insulin due to Tfs  2/11- TF's restarted so CBGs going up- 486 this AM- restarted Levemir 4 units BID and SSI- will titrate as required.    CBG (last 3)  Recent Labs    12/20/21 0001 12/20/21 0400 12/20/21 0821  GLUCAP 280* 137* 222*  Increased Levimir to 10U q12h  Changed to moderate SSI q 4h since oral intake is low   2/20- CBGs 300s- but last in computer was noon- can't see if increase in levemir effective quite yet.   2/21- will increase to 14 units BID  2/22- Cbgs still really elevated in 200s-300s- will add Novolog coverage per Dm coordinator 3 units at 8pm, midnight and 4am-   2/23- DM coordinator wants pt to increase coverage to 6 units with TF's.  34. New onset confusion/poor memory- anoxia? From prolonged CPR  2/9- will check CT of head- and stop Gabapentin for nerve pain  due to increased sedation- had before but so out of it per daughter- much worse last 2 days.   2/15- variable -tangential and  poor memory- will need to d/w daughter  2/21- still confused, but acutely medically ill still- is improving 14. ABLA with chronic anemia  2/13 hb stable at 8.2  2/21- Hb stable at 8.0 15. Scab on back of head-pressure ulcer unstageable/Blister on sternum   2/9- on the back of her head  2/10- will need to have hair washed to see if we can see wound that's on CT  2/13 Eyota RN examined wound today --peroxide and mupirocin bid with dry dressing  2/15- touched base with nursing- stable- unstageable on back of head- blister healing 16. HTN with hx of VF arrest  2/10- pt's BP 178/95- running 182E/833V- systolic- will d/w renal since not on any BP meds-   2/11- Started norvasc and BP looks better 445 systolic- will con't to monitor trend.    Vitals:   12/19/21 1931 12/20/21 0310  BP: (!) 107/54 117/61  Pulse: 98 100  Resp: 18 18  Temp: 98.6 F (37 C) 98.2 F (36.8 C)  SpO2: 98% 98%    17. Constipation  2/20- LBM yesterday per nursing 18.  Leukocytosis-UTI/Bacteremia- Klebsiella and E Fecalis   2/15- will remove foley and bladder scan; also check U/A and Cx- foley was per renal .   2/17- pt has UTI- started on Cefepime yesterday- also WBC up to 17k  2/18 WBC down to 13.2K , recheck in am   2/20- WBC up to 18.3k- due to HD catheter and no improvement- actually worse, d/w ID and pharmacy- agreed to change cefepime to rocephin- also start Vanc and check blood Cx's.   2/21- changed to Ampicillin with ID discussion- off Vanc due to E fecalis   2/22- WBC still 16.4- will check daily fo rnow  2/23- WBC 14.2- coming down 19. Fall  2/16- pt on floor last night- denied pain- CT head was ordered - pt refused to keep on mask to go downstairs- will get this AM.  20. Poor appetite-  2/17- will get PEG placed next week so can remove Cortrak- spoke with PA to get ordered  2/20- order in, but cannot do when ill/sick/  2/21- wait on PEG- con't cortrak  2/22- pt eating entire banana this AM- which is new  I spent a total of 40   minutes on total care today- >50% coordination of care- due to going over with Renal, [place] DM coordinator and dietician.     LOS: 16 days A FACE TO FACE EVALUATION WAS PERFORMED  Clora Ohmer 12/20/2021, 9:23 AM

## 2021-12-20 NOTE — Procedures (Signed)
PROCEDURE SUMMARY:  Successful removal of tunneled hemodialysis catheter.  Patient tolerated well.  EBL < 5 mL  See full dictation in Imaging for details.  Nyeema Want Lemmie Evens Jiselle Sheu PA-C 12/20/2021 1:24 PM

## 2021-12-20 NOTE — Progress Notes (Signed)
Inpatient Diabetes Program Recommendations  AACE/ADA: New Consensus Statement on Inpatient Glycemic Control (2015)  Target Ranges:  Prepandial:   less than 140 mg/dL      Peak postprandial:   less than 180 mg/dL (1-2 hours)      Critically ill patients:  140 - 180 mg/dL   Lab Results  Component Value Date   GLUCAP 222 (H) 12/20/2021   HGBA1C 9.0 (H) 10/28/2021    Review of Glycemic Control  Latest Reference Range & Units 12/19/21 07:52 12/19/21 11:23 12/19/21 15:48 12/19/21 16:47 12/19/21 20:54 12/20/21 00:01 12/20/21 04:00 12/20/21 08:21  Glucose-Capillary 70 - 99 mg/dL 275 (H) 71 113 (H) 114 (H) 297 (H)  Novolog 11 units 280 (H)  Novolog 11 units 137 (H)  Novolog 5 units 222 (H)  Novolog 5 units     Diabetes history: DM 2 Outpatient Diabetes medications:  Novolog 0-9 units q 4 hours, Levemir 4 units bid Current orders for Inpatient glycemic control:  Levemir 14 units bid, Novolog moderate q 4 hours, Novolog 3 units @ 2000, 1200 MN, 0400 am. Osmolite 1.5 cal- 75 ml/hr from 1800-0700 (13 hours)  Inpatient Diabetes Program Recommendations:    -   May consider increasing tube feed coverage at 2000, 1200 MN, and 0400 am- 6 units  Thanks,  Tama Headings RN, MSN, BC-ADM Inpatient Diabetes Coordinator Team Pager [phone] (8a-5p)

## 2021-12-21 ENCOUNTER — Other Ambulatory Visit (HOSPITAL_COMMUNITY): Payer: Medicare PPO

## 2021-12-21 LAB — CBC WITH DIFFERENTIAL/PLATELET
Abs Immature Granulocytes: 0.15 10*3/uL — ABNORMAL HIGH (ref 0.00–0.07)
Basophils Absolute: 0.1 10*3/uL (ref 0.0–0.1)
Basophils Relative: 1 %
Eosinophils Absolute: 0.2 10*3/uL (ref 0.0–0.5)
Eosinophils Relative: 1 %
HCT: 24.2 % — ABNORMAL LOW (ref 36.0–46.0)
Hemoglobin: 8 g/dL — ABNORMAL LOW (ref 12.0–15.0)
Immature Granulocytes: 1 %
Lymphocytes Relative: 18 %
Lymphs Abs: 2.6 10*3/uL (ref 0.7–4.0)
MCH: 28.7 pg (ref 26.0–34.0)
MCHC: 33.1 g/dL (ref 30.0–36.0)
MCV: 86.7 fL (ref 80.0–100.0)
Monocytes Absolute: 1.3 10*3/uL — ABNORMAL HIGH (ref 0.1–1.0)
Monocytes Relative: 9 %
Neutro Abs: 10.2 10*3/uL — ABNORMAL HIGH (ref 1.7–7.7)
Neutrophils Relative %: 70 %
Platelets: 46 10*3/uL — ABNORMAL LOW (ref 150–400)
RBC: 2.79 MIL/uL — ABNORMAL LOW (ref 3.87–5.11)
RDW: 14.7 % (ref 11.5–15.5)
WBC: 14.5 10*3/uL — ABNORMAL HIGH (ref 4.0–10.5)
nRBC: 0 % (ref 0.0–0.2)

## 2021-12-21 LAB — LACTATE DEHYDROGENASE: LDH: 270 U/L — ABNORMAL HIGH (ref 98–192)

## 2021-12-21 LAB — BASIC METABOLIC PANEL
Anion gap: 11 (ref 5–15)
BUN: 26 mg/dL — ABNORMAL HIGH (ref 8–23)
CO2: 25 mmol/L (ref 22–32)
Calcium: 8.9 mg/dL (ref 8.9–10.3)
Chloride: 100 mmol/L (ref 98–111)
Creatinine, Ser: 2.55 mg/dL — ABNORMAL HIGH (ref 0.44–1.00)
GFR, Estimated: 19 mL/min — ABNORMAL LOW (ref >60)
Glucose, Bld: 91 mg/dL (ref 70–99)
Potassium: 4.1 mmol/L (ref 3.5–5.1)
Sodium: 136 mmol/L (ref 135–145)

## 2021-12-21 LAB — GLUCOSE, CAPILLARY
Glucose-Capillary: 132 mg/dL — ABNORMAL HIGH (ref 70–99)
Glucose-Capillary: 134 mg/dL — ABNORMAL HIGH (ref 70–99)
Glucose-Capillary: 149 mg/dL — ABNORMAL HIGH (ref 70–99)
Glucose-Capillary: 208 mg/dL — ABNORMAL HIGH (ref 70–99)
Glucose-Capillary: 83 mg/dL (ref 70–99)
Glucose-Capillary: 87 mg/dL (ref 70–99)

## 2021-12-21 MED ORDER — SODIUM CHLORIDE 0.9 % IV SOLN
2.0000 g | Freq: Three times a day (TID) | INTRAVENOUS | Status: AC
  Administered 2021-12-21 – 2022-01-02 (×37): 2 g via INTRAVENOUS
  Filled 2021-12-21 (×39): qty 2000

## 2021-12-21 NOTE — Progress Notes (Signed)
[facility] Progress Note   Subjective:  Seen in room, no c/o's today.  TDC removed by IR yesterday.   Objective Vitals:   12/20/21 1355 12/20/21 1935 12/20/21 1956 12/21/21 0416  BP: 104/64 (!) 127/57  119/63  Pulse: 96 91  96  Resp: 18 15  20   Temp: 98.5 F (36.9 C) 98.3 F (36.8 C)  98.8 F (37.1 C)  TempSrc:    Oral  SpO2: 100% (!) 69% 96% 100%  Weight:    68.6 kg  Height:       Physical Exam General: Well appearing woman, NAD. Room air. NG tube in place/clamped. Heart: RRR; no murmur Lungs: CTAB; no rales or wheezing Extremities: No LE edema Dialysis Access: TDC in R chest  Dialysis Orders: Establishing AKI orders. Has outpatient spot at [facility] TTS 2nd shift, will change schedule when getting closer to discharge.     Assessment/Plan: Severe AKI: HD dependent, felt d/t ischemic ATN in setting of cardiac arrest, shock, IV contrast. Underlying CKD with baseline Cr 1.3 in 2021. Dialysis started here 10/31/2021 (CRRT initially). Getting HD here on MWF schedule. Had HD yesterday.  Park removed 2/23 due to bacteremia. Will monitor labs / exam to determine when next access/ HD needed.    Fever/Enterococcus bacteremia: Blood Cx 2/20 +. Now on IV ampicillin. Repeat Blood Cx negative. ID following. Getting HD line holiday now.   Thrombocytopenia: Plts down to 5K range, hematology was following. HIT negative. Suspected ITP s/p Nplate and steroids. S/p BMBx without malignancy. Improved, then dropped again on 2/21 to 17. Seen by hematology, suspect due to acute infection. Plts up 46 today.   V-fib cardiac arrest: Outside of hospital d/t PE (on admit).  Massive "saddle" PE s/p mechanical thrombectomy 10/29/21: Vascular US without DVT. Did have partial hypercoaguable work-up with + lupus anticoagulant. On Eliqius.  Retroperitoneal bleed/hemorrhagic shock/ABLA: Originating from L liver lobe, s/p embolization 1/4, also required FFP, plts, Vit K, PRBCs; now stable.  Acute  hypoxic respiratory failure (resolved): D/t #2, extubated 11/07/2021.  T2DM/hyperglycemia: Per primary team.  Encephalopathy: Likely multifactorial, improved/resolved.  Anemia (CKD, prior ABLA): Hgb 8. S/p 21U PRBCs this admit. Heme previously had not recommended ESA d/t likely hypercoagulable state.    Secondary hyperparathyroidism: CorrCa, Phos ok without binders. PTH 129 11/22/21 - no VDRA for now.   Nutrition: Alb low - ongoing bolus TF's and protein supplements. Considering PEG.   Debility/weakness: Currently in CIR for therapy.   Kelly Splinter, MD 12/21/2021, 12:24 PM     Additional Objective Labs: Basic Metabolic Panel: Recent Labs  Lab 12/15/21 0641 12/16/21 0709 12/18/21 0537 12/19/21 0441 12/21/21 0617  NA 128*   < > 133* 135 136  K 4.7   < > 4.3 4.1 4.1  CL 93*   < > 96* 95* 100  CO2 26   < > 28 27 25   GLUCOSE 603*   < > 343* 226* 91  BUN 28*   < > 28* 39* 26*  CREATININE 2.45*   < > 1.96* 2.64* 2.55*  CALCIUM 8.5*   < > 8.7* 9.2 8.9  PHOS 2.6  --  3.0 4.0  --    < > = values in this interval not displayed.    Liver Function Tests: Recent Labs  Lab 12/17/21 0531 12/18/21 0537 12/18/21 1121 12/19/21 0441  AST 33  --  41  --   ALT 37  --  48*  --   ALKPHOS 121  --  119  --  BILITOT 0.4  --  0.4  --   PROT 6.7  --  7.2  --   ALBUMIN 2.6* 2.6* 2.7* 3.0*    CBC: Recent Labs  Lab 12/18/21 0644 12/18/21 0832 12/19/21 0441 12/19/21 0442 12/20/21 0650 12/21/21 0617  WBC 15.5* 16.0* 16.4*  --  14.2* 14.5*  NEUTROABS 12.0* 11.7* 12.2*  --  10.1* 10.2*  HGB 8.1* 8.0* 8.0*  --  7.6* 8.0*  HCT 24.6* 25.3* 24.7*  --  23.6* 24.2*  MCV 87.9 88.5 87.9  --  87.1 86.7  PLT 17* 17* 27* 28* 33* 46*    Blood Culture    Component Value Date/Time   SDES BLOOD LEFT ANTECUBITAL 12/19/2021 0447   SPECREQUEST  12/19/2021 0447    BOTTLES DRAWN AEROBIC AND ANAEROBIC Blood Culture adequate volume   CULT  12/19/2021 0447    NO GROWTH 2 DAYS Performed at [facility], [place] [address], [place], [place] [address]    REPTSTATUS PENDING 12/19/2021 0814   Studies/Results: IR Removal Tun Cv Cath W/O FL  Result Date: 12/20/2021 INDICATION: Bacteremia in patient with tunnel hemodialysis catheter in place. Request for tunneled dialysis catheter removal. EXAM: REMOVAL OF TUNNELED HEMODIALYSIS CATHETER MEDICATIONS: 7 mL 1 % lidocaine COMPLICATIONS: None immediate. PROCEDURE: Informed written consent was obtained from the patient following an explanation of the procedure, risks, benefits and alternatives to treatment. A time out was performed prior to the initiation of the procedure. Sterile technique was utilized including mask, sterile gloves, sterile drape, and hand hygiene. Hibiclens solution was used to prep the patient's right neck, chest and existing catheter. 1% lidocaine was injected around the catheter and the subcutaneous tunnel. The catheter was dissected out using scissors and curved hemostats until the cuff was freed from the surrounding fibrous sheath. The catheter was removed intact. Hemostasis was obtained with manual compression. A dressing was placed. The patient tolerated the procedure well without immediate post procedural complication. IMPRESSION: Successful removal of tunneled dialysis catheter. Read by: Durenda Guthrie, PA-C Electronically Signed   By: Corrie Mckusick D.O.   On: 12/20/2021 13:51   Medications:  sodium chloride Stopped (12/19/21 0145)   ampicillin (OMNIPEN) IV 2 g (12/21/21 0859)    ALPRAZolam  0.25 mg Oral Daily   amLODipine  10 mg Oral Q2000   atorvastatin  20 mg Oral Daily   B-complex with vitamin C  1 tablet Oral Daily   chlorhexidine  15 mL Mouth Rinse BID   Chlorhexidine Gluconate Cloth  6 each Topical Q0600   dicyclomine  10 mg Oral TID AC   feeding supplement (NEPRO CARB STEADY)  237 mL Oral TID BM   feeding supplement (OSMOLITE 1.5 CAL)  975 mL Per Tube Q24H   feeding supplement (PROSource TF)  45 mL Per Tube BID    folic acid  2 mg Oral Daily   heparin sodium (porcine)  3,200 Units Intracatheter Once   insulin aspart  0-15 Units Subcutaneous Q4H   insulin aspart  6 Units Subcutaneous TID WC   insulin detemir  14 Units Subcutaneous BID   lidocaine  2 patch Transdermal Q24H   megestrol  800 mg Oral Daily   mupirocin cream  1 application Topical BID   pantoprazole  40 mg Oral BID   PARoxetine  20 mg Oral QHS   senna  2 tablet Oral Daily

## 2021-12-21 NOTE — Progress Notes (Signed)
Speech Language Pathology Daily Session Note  Patient Details  Name: [name] MRN: [id number] Date of Birth: 18-Jun-1951  Today's Date: 12/21/2021 SLP Individual Time: 0930-1025 SLP Individual Time Calculation (min): 55 min  Short Term Goals: Week 3: SLP Short Term Goal 1 (Week 3): Pt will demonstrated sustained attention in 5-7 minute intervals with min A verbal cues. SLP Short Term Goal 2 (Week 3): Pt will demonstrate basic problem solving skills with min A verbal cues. SLP Short Term Goal 3 (Week 3): Pt will demonstrate ability to self-monitor and recognize errors in functional tasks with mod A verbal cues. SLP Short Term Goal 4 (Week 3): Pt will demonstrated orientation x4 with min A verbal cues for visual aids. SLP Short Term Goal 5 (Week 3): Pt tolerate dys 3 textures and thin liquids with minimal overt s/s aspiration and supervision A verbal cues for safe swallow strategies.  Skilled Therapeutic Interventions: Skilled treatment session focused on cognitive and dysphagia goals. Upon arrival, patient was asleep in bed but easily roused. Patient reported feeling "confused" but in regards to when and what procedures are to be completed today. SLP provided education regarding how the TEE was cancelled today. Patient reported feeling hungry and both the PA and nursing reported it was now safe to restart her current diet as she was NPO for the procedure. Patient received a tray of regular textures. Patient ate ~25% of the meal and demonstrated difficulty with mastication of the "chewy" bagel which patient would intermittently orally expectorate, no overt s/s of aspiration noted. Recommend patient continue current diet with full supervision. Patient required Min verbal cues for selective attention and functional problem solving with self-feeding with increased initiation of both functional tasks and verbal expression noted. Patient left upright in bed with alarm on and all needs within reach.  Continue with current plan of care.      Pain Pain Assessment Pain Scale: 0-10 Pain Score: 7  Pain Type: Acute pain Pain Location: Leg Pain Orientation: Posterior;Right Pain Descriptors / Indicators: Aching Pain Intervention(s): Medication (See eMAR)  Therapy/Group: Individual Therapy  Egan Sahlin 12/21/2021, 12:21 PM

## 2021-12-21 NOTE — Progress Notes (Signed)
PROGRESS NOTE   Subjective/Complaints:  Pt reports LBM overnight- slept pretty well.  R foot pain the same- wants meds.   Catheter removed for HD yesterday and Renal following to see what the next step is.    ROS: Limited by cognition/poor memory   Objective:   IR Removal Tun Cv Cath W/O FL  Result Date: 12/20/2021 INDICATION: Bacteremia in patient with tunnel hemodialysis catheter in place. Request for tunneled dialysis catheter removal. EXAM: REMOVAL OF TUNNELED HEMODIALYSIS CATHETER MEDICATIONS: 7 mL 1 % lidocaine COMPLICATIONS: None immediate. PROCEDURE: Informed written consent was obtained from the patient following an explanation of the procedure, risks, benefits and alternatives to treatment. A time out was performed prior to the initiation of the procedure. Sterile technique was utilized including mask, sterile gloves, sterile drape, and hand hygiene. Hibiclens solution was used to prep the patient's right neck, chest and existing catheter. 1% lidocaine was injected around the catheter and the subcutaneous tunnel. The catheter was dissected out using scissors and curved hemostats until the cuff was freed from the surrounding fibrous sheath. The catheter was removed intact. Hemostasis was obtained with manual compression. A dressing was placed. The patient tolerated the procedure well without immediate post procedural complication. IMPRESSION: Successful removal of tunneled dialysis catheter. Read by: Durenda Guthrie, PA-C Electronically Signed   By: Corrie Mckusick D.O.   On: 12/20/2021 13:51   Recent Labs    12/20/21 0650 12/21/21 0617  WBC 14.2* 14.5*  HGB 7.6* 8.0*  HCT 23.6* 24.2*  PLT 33* 46*   Recent Labs    12/19/21 0441 12/21/21 0617  NA 135 136  K 4.1 4.1  CL 95* 100  CO2 27 25  GLUCOSE 226* 91  BUN 39* 26*  CREATININE 2.64* 2.55*  CALCIUM 9.2 8.9    Intake/Output Summary (Last 24 hours) at 12/21/2021  8101 Last data filed at 12/20/2021 0900 Gross per 24 hour  Intake 120 ml  Output 225 ml  Net -105 ml     Pressure Injury 12/04/21 Head Posterior Unstageable - Full thickness tissue loss in which the base of the injury is covered by slough (yellow, tan, gray, green or brown) and/or eschar (tan, brown or black) in the wound bed. 3 cm round, scabbed area to po (Active)  12/04/21 2000  Location: Head  Location Orientation: Posterior  Staging: Unstageable - Full thickness tissue loss in which the base of the injury is covered by slough (yellow, tan, gray, green or brown) and/or eschar (tan, brown or black) in the wound bed.  Wound Description (Comments): 3 cm round, scabbed area to posterior head.  Present on Admission: Yes    Physical Exam: Vital Signs Blood pressure 119/63, pulse 96, temperature 98.8 F (37.1 C), temperature source Oral, resp. rate 20, height 5\' 5"  (1.651 m), weight 68.6 kg, SpO2 100 %.       General: awake, alert, appropriate, laying supine in bed; slow to respond. NAD HENT: conjugate gaze; oropharynx dry- cortrak in place CV: regular rate; no JVD Pulmonary: CTA B/L; no W/R/R- good air movement GI: soft, NT, ND, (+)BS; hypoactive Psychiatric: appropriate Neurological: Ox2- with cues- delayed responses  Skin: unstageable wound with scabbing  on back of head 3-4cm in diameter- also has popped/dry blister on L sternum- slough gone from base- -no change on head wound today- large scab/thick on back of head  Musculoskeletal:     Cervical back: Neck supple. No tenderness.     Comments: UE 5-/5 B/L  LE 3/5 HF/KE- DF 4-/5 on L and 2-/5 on R; PF 4-/5 B/L  R foot drop- at rest in PF   Skin:    General: Skin is warm and dry. Wound on occiput/matted with hair    Comments: R chest HD catheter  Neuro: Mild tremors with movement. Reasonable sitting balance Numb/absent/decreased sensation from mid calf to toes B/L L>R Vague- Ox2 with cues   Assessment/Plan: 1.  Functional deficits which require 3+ hours per day of interdisciplinary therapy in a comprehensive inpatient rehab setting. Physiatrist is providing close team supervision and 24 hour management of active medical problems listed below. Physiatrist and rehab team continue to assess barriers to discharge/monitor patient progress toward functional and medical goals  Care Tool:  Bathing    Body parts bathed by patient: Right arm, Left arm, Chest, Right upper leg, Left upper leg, Face, Front perineal area, Abdomen, Buttocks, Right lower leg, Left lower leg   Body parts bathed by helper: Front perineal area, Buttocks, Right lower leg, Left lower leg     Bathing assist Assist Level: Minimal Assistance - Patient > 75%     Upper Body Dressing/Undressing Upper body dressing   What is the patient wearing?: Pull over shirt    Upper body assist Assist Level: Minimal Assistance - Patient > 75%    Lower Body Dressing/Undressing Lower body dressing      What is the patient wearing?: Pants, Underwear/pull up     Lower body assist Assist for lower body dressing: Maximal Assistance - Patient 25 - 49%     Toileting Toileting Toileting Activity did not occur (Clothing management and hygiene only): N/A (no void or bm)  Toileting assist Assist for toileting: Moderate Assistance - Patient 50 - 74%     Transfers Chair/bed transfer  Transfers assist     Chair/bed transfer assist level: Moderate Assistance - Patient 50 - 74%     Locomotion Ambulation   Ambulation assist   Ambulation activity did not occur: Safety/medical concerns  Assist level: 2 helpers Assistive device: Walker-rolling Max distance: 10'   Walk 10 feet activity   Assist  Walk 10 feet activity did not occur: Safety/medical concerns  Assist level: 2 helpers Assistive device: Walker-rolling   Walk 50 feet activity   Assist Walk 50 feet with 2 turns activity did not occur: Safety/medical concerns          Walk 150 feet activity   Assist Walk 150 feet activity did not occur: Safety/medical concerns         Walk 10 feet on uneven surface  activity   Assist Walk 10 feet on uneven surfaces activity did not occur: Safety/medical concerns         Wheelchair     Assist Is the patient using a wheelchair?: Yes Type of Wheelchair: Manual    Wheelchair assist level: Supervision/Verbal cueing Max wheelchair distance: 60'    Wheelchair 50 feet with 2 turns activity    Assist        Assist Level: Supervision/Verbal cueing   Wheelchair 150 feet activity     Assist      Assist Level: Dependent - Patient 0%   Blood pressure 119/63, pulse  96, temperature 98.8 F (37.1 C), temperature source Oral, resp. rate 20, height 5\' 5"  (1.651 m), weight 68.6 kg, SpO2 100 %.  Medical Problem List and Plan: 1. Functional deficits secondary to debility related to VF cardiac arrest secondary to massive pulmonary emboli status post mechanical thrombectomy             -patient may not shower due to HD catheter- unless it's removed             -ELOS/Goals: 10-14 days supervision to min A for PT/OT SLP  -con't CIR- Continue CIR- PT, OT and SLP  Continue CIR- PT, OT and SLP Family conference today at 11am- to discuss pt's care- and dispo 2/24- con't CIR- PT, OT and SLP- pt's mentation looks closer back to baseline.  2.  Antithrombotics: -DVT/anticoagulation:  Pharmaceutical: Other (comment) Eliquis- has to use due to saddle PE 2/23- stopped due to plts by Heme/Onc             -antiplatelet therapy: N/A 3. Pain Management: Neurontin 100 mg twice daily, oxycodone as needed  2/9- had been on gabapentin for nerve pain-will stop due to sedation- will start RLE lidoderm patch 2 patches for pain.   2/13  more alert off gabapentin in spite of being on at home prior for neuropathy  2/22- has oxy prn for RLE pain- using intermittently.  4. Mood anxiety-  Cymbalta 20 mg daily, Xanax 0.25  mg daily as needed provide emotional support  2/21- change to Paxil 20 mg nightly- 2/24- no side effects noted per pt- flat, but looks less anxious              -antipsychotic agents: N/A 5. Neuropsych: This patient is not capable of making decisions on her own behalf. 6. Skin/Wound Care: Routine skin checks 7. Fluids/Electrolytes/Nutrition: still not eating much  -continue TF for now.   -hopefully po intake will pick up as mentation improves.   2/20- family wants PEG_ pt kept saying doesn't- will d/w family tomorrow 2/21- family still wants PEG when possible  2/23- wil do once bacteremia addressed 8.  Pericasular hepatic /hemorrhagic shock/acute blood loss anemia/status post left liver lobe embolization  Hgb down again to 7.7 repeat non contrast CT abd no change in pericapsular hepatic bleed , check stool OB  2/24- Hb 8.0- will con't to monitor 9.  AKI secondary to ATN.  Follow-up per renal services.  Started clip for AKI- not ESRD, however Cr almost 5 today- getting HD tomorrow 2/8.   2/9- last Cr 5.74- up from 4.97 up from 4.45- rising- per renal- likely HD tomorrow?.  2/24- HD catheter removed due to bacteremia- renal following for possible more HD  10.  Thrombocytopenia.  Suspect ITP.  Follow-up per hematology 2/21- Plts were 214- down to 17k- will transfuse and cal Heme/Onc- 2/22- plts up to 27k- this Am after transfusion- Heme/Onc not sure why drop in plts- however said no transfusion unless plts drop<10k  2.23- plts 33k this AM- doing better 2/24- Plts 46k- off Eliquis per Heme/Onc- had saddle PE- but risks/benefits require her ot be off Eliquis 11.  Hyperlipidemia.  Lipitor 12. Diabetes- Poor appetite- has Cortrak for intake due to poor appetite-stop insulin- was on at home- levemir 4 units BID when came to rehab, but dropping CBGs- so will hold- will do CBGs BID Will assess if still needs- d/c insulin due to Tfs  2/11- TF's restarted so CBGs going up- 486 this AM- restarted  Levemir 4 units  BID and SSI- will titrate as required.    CBG (last 3)  Recent Labs    12/20/21 2047 12/21/21 0004 12/21/21 0414  GLUCAP 344* 149* 83  Increased Levimir to 10U q12h  Changed to moderate SSI q 4h since oral intake is low   Levemir at 14 units BID  2/22- Cbgs still really elevated in 200s-300s- will add Novolog coverage per Dm coordinator 3 units at 8pm, midnight and 4am-   2/23- DM coordinator wants pt to increase coverage to 6 units with TF's.   2/24- CBGs labile, however finally have 83 this AM and 149 overnight- will con't regimen and titrate as required 13. New onset confusion/poor memory- anoxia? From prolonged CPR  2/9- will check CT of head- and stop Gabapentin for nerve pain due to increased sedation- had before but so out of it per daughter- much worse last 2 days.   2/15- variable -tangential and  poor memory- will need to d/w daughter  2/21- still confused, but acutely medically ill still- is improving  2/24- poor memory due to prolonged CPR initially, however is doing better- 14. ABLA with chronic anemia  2/13 hb stable at 8.2  2/21- Hb stable at 8.0 15. Scab on back of head-pressure ulcer unstageable/Blister on sternum   2/24- scab unstageable on back of head and blister almost healed on L breast.  16. HTN with hx of VF arrest  2/10- pt's BP 178/95- running [id number]/[id number]- systolic- will d/w renal since not on any BP meds-   2/11- Started norvasc and BP looks better 673 systolic- will con't to monitor trend. 2/24- BP controlled- con't regimen    Vitals:   12/20/21 1956 12/21/21 0416  BP:  119/63  Pulse:  96  Resp:  20  Temp:  98.8 F (37.1 C)  SpO2: 96% 100%    17. Constipation  2/24- LBM last night- con't to monitor 18. Leukocytosis-UTI/Bacteremia- Klebsiella and E Fecalis   2/15- will remove foley and bladder scan; also check U/A and Cx- foley was per renal .   2/17- pt has UTI- started on Cefepime yesterday- also WBC up to 17k  2/18 WBC down to  13.2K , recheck in am   2/20- WBC up to 18.3k- due to HD catheter and no improvement- actually worse, d/w ID and pharmacy- agreed to change cefepime to rocephin- also start Vanc and check blood Cx's.   2/21- changed to Ampicillin with ID discussion- off Vanc due to E fecalis   2/22- WBC still 16.4- will check daily fo rnow  2/23- WBC 14.2- coming down  2/24- WBC 14.5- Neutrophils 70% so not a left shift- stable-  19. Fall  2/16- pt on floor last night- denied pain- CT head was ordered - pt refused to keep on mask to go downstairs- will get this AM.  20. Poor appetite-  2/17- will get PEG placed next week so can remove Cortrak- spoke with PA to get ordered  2/20- order in, but cannot do when ill/sick/  2/21- wait on PEG- con't cortrak  2/22- pt eating entire banana this AM- which is new  I spent a total of 35   minutes on total care today- >50% coordination of care- due to prolonged review of all labs and notes- and d/w nursing about improving mentation     LOS: 17 days A FACE TO FACE EVALUATION WAS PERFORMED  Khristy Kalan 12/21/2021, 8:21 AM

## 2021-12-21 NOTE — Progress Notes (Addendum)
Discussed with Dr. Marlou Porch who reviewed this case, we are quite hesitant to do TEE with the current platelet level. TEE will be cancelled for today. We will try to schedule the TEE for next Monday, if no opening, then next Tue.    Addendum:  There was some discussion between cardiology attending and infectious disease doc. The concern was patient is in inpatient rehab and doing the TEE technically requires [name] to readmit the patient and dc him right afterward. the insurance may not cover it. We spoke with ID and had cardiologist attending over-read the last TTE to make sure there was no evidence of endocarditis. From ID perspective, they feel it is safe for the patient to delay TEE until after she is discharged from the inpatient rehab. Therefore her TEE for next Monday will be cancelled as well. I have called the patient's daughter and gave her a update as well. She completely understand and agree with cancelling the TEE for now

## 2021-12-21 NOTE — Progress Notes (Signed)
Physical Therapy Session Note  Patient Details  Name: [name] MRN: [id number] Date of Birth: [birth year]  Today's Date: 12/21/2021 PT Individual Time: 1015-1040 PT Individual Time Calculation (min): 25 min   Short Term Goals: Week 3:  PT Short Term Goal 1 (Week 3): =LTG due to ELOS  Skilled Therapeutic Interventions/Progress Updates: Pt presented in bed initially refusing therapy. Pt was due to have TEE however was cancelled today. Pt appeared overall frustrated with current medical complications and requested to defer therapy today. Suggested that pt just participate a little in therapy and pt agreeable to PTA returned in 30 min. PTA returned 30 min later with pt agreeable to ambulate. HOB elevated with pt able to perform supine to sit with CGA. Pt then required minA for Sit to stand from EOB and c/o increased R calf pain when performing wt bearing. Due to urgency pt transferred to w/c and transported to toilet where pt performed stand pivot with PTA stabilizing pt with modA. While pt on toilet under supervision of tech, PTA discussed pt's  status with nurse. PTA emphasized to RN and NT that pain is located at pt's calf not foot/ankle. PTA returned to pt's room and assisted pt from toilet to wc and w/c to bed in same manner as prior. Pt was able to return to supine on flat bed with supervision and pt was reposition to comfort independently. Pt left in bed at end of session with bed alarm on, call bell within reach and needs met.      Therapy Documentation Precautions:  Precautions Precautions: Fall, Other (comment) Precaution Comments: cortrak; right leg discomfort - PTA Restrictions Weight Bearing Restrictions: No General: PT Amount of Missed Time (min): 35 Minutes PT Missed Treatment Reason: Patient unwilling to participate Vital Signs: Therapy Vitals Temp: 98.9 F (37.2 C) Pulse Rate: 92 Resp: 18 BP: 117/65 Patient Position (if appropriate): Lying Oxygen Therapy SpO2: 100  % O2 Device: Room Air Pain:   Mobility:   Locomotion :    Trunk/Postural Assessment :    Balance:   Exercises:   Other Treatments:      Therapy/Group: Individual Therapy  Marcell Chavarin 12/21/2021, 4:34 PM

## 2021-12-21 NOTE — Progress Notes (Signed)
Inpatient Diabetes Program Recommendations  AACE/ADA: New Consensus Statement on Inpatient Glycemic Control (2015)  Target Ranges:  Prepandial:   less than 140 mg/dL      Peak postprandial:   less than 180 mg/dL (1-2 hours)      Critically ill patients:  140 - 180 mg/dL   Lab Results  Component Value Date   GLUCAP 83 12/21/2021   HGBA1C 9.0 (H) 10/28/2021    Review of Glycemic Control  Latest Reference Range & Units 12/20/21 08:21 12/20/21 11:56 12/20/21 16:28 12/20/21 20:47 12/21/21 00:04 12/21/21 04:14  Glucose-Capillary 70 - 99 mg/dL 222 (H) 126 (H) 110 (H) 344 (H) 149 (H) 83  (H): Data is abnormally high  Diabetes history: DM 2 Outpatient Diabetes medications:  Novolog 0-9 units q 4 hours, Levemir 4 units bid Current orders for Inpatient glycemic control:  Levemir 14 units bid, Novolog moderate q 4 hours, Novolog 6 units @ 2000, 1200 MN, 0400 am. Osmolite 1.5 cal- 75 ml/hr from 1800-0700 (13 hours)  Inpatient Diabetes Program Recommendations:    Might consider Novolog 6 units with the start of tube feeds @ 1800, 2300, 0200  Will continue to follow while inpatient.  Thank you, Reche Dixon, MSN, RN Diabetes Coordinator Inpatient Diabetes Program [phone] (team pager from 8a-5p)

## 2021-12-21 NOTE — Progress Notes (Signed)
PHARMACY NOTE:  ANTIMICROBIAL RENAL DOSAGE ADJUSTMENT  Current antimicrobial regimen includes a mismatch between antimicrobial dosage and estimated renal function.  As per policy approved by the Pharmacy & Therapeutics and Medical Executive Committees, the antimicrobial dosage will be adjusted accordingly.  Current antimicrobial dosage:  Ampicillin 2g IV every 12 hours  Indication: Enterococcus Bacteremia  Renal Function:  Estimated Creatinine Clearance: 19.1 mL/min (A) (by C-G formula based on SCr of 2.55 mg/dL (H)). []      On intermittent HD, scheduled: []      On CRRT    Antimicrobial dosage has been changed to:   Ampicillin 2g IV every 8 hours  Additional comments: The patient had AKI and was receiving IHD however now with improving UOP and slight trend down in SCr without HD showing some renal recovery.  Thank you for allowing pharmacy to be a part of this patients care.  Alycia Rossetti, PharmD, BCPS Clinical Pharmacist 12/21/2021 7:44 AM   **Pharmacist phone directory can now be found on [email or web address] ([email or web address]).  Listed under [facility].

## 2021-12-21 NOTE — Progress Notes (Signed)
ID Brief Note  Afebrile. WBC is stable at 14.5 Repeat blood cx 2/22 no growth in 2 days   Concerns for TEE at this time from Cardiology due to low platelets count as well as patient being in rehab for which patient will need to be readmitted to the hospital, have a TEE done and discharge back to rehab as insurance might not cover it.   Dr Candee Furbish had a look again at the TTE to check for any concerning findings related to vegetations or endocarditis which is negative   Low suspicion for endocarditis at this time. Likely source is h/o hepatic bleed vs HD catheter ( removed 2/23)  Would treat with IV ampicillin for 2 weeks from 2/22. EOT 01/02/22 If patient is ready to be discharged from rehab prior to EOT, OK to switch to amoxicillin ( renal dosing) for remainder of duration  Please have 2 sets of blood cultures drawn a week after end of abtx around 3/15 as surveillance blood culture  ID will sign off. Please call with questions  Rosiland Oz, MD Infectious Disease Physician [facility] [address] [place], [place] [address] Phone: [phone]   Fax: [phone]

## 2021-12-21 NOTE — Progress Notes (Signed)
Occupational Therapy Session Note  Patient Details  Name: [name] MRN: [id number] Date of Birth: [birth year]  Today's Date: 12/21/2021 OT Individual Time: [phone] OT Individual Time Calculation (min): 53 min    Short Term Goals: Week 3:  OT Short Term Goal 1 (Week 3): pt will complete UB dressing with MIN A OT Short Term Goal 2 (Week 3): pt will recall correct hand placement for sit<>stand transfers for 3 consecutive OT sessions OT Short Term Goal 3 (Week 3): pt wil maintain sustained attention during 2 consecutive ADL sessions OT Short Term Goal 4 (Week 3): pt will complete dynamic balance tasks during ADLs with MIN A  Skilled Therapeutic Interventions/Progress Updates:  Pt received asleep in supine but easily able to arouse and agreeable to OT intervention. Session focus on BADL reeducation, functional mobility, dynamic standing balance and decreasing overall caregiver burden. Pt reports feeling confused this AM, oriented pt to procedure today and that's why she hasn't had anything to eat. Turned on gospel music to facilitate soothing, calm environment to decrease anxiety. Pt emotional reporting just feeling "off." Provided emotional support and facilitated safe space to allow for emotional processing.  Pt was able to transition supine>sit with supervision to don new socks with MODA, provided pt with lip moisturizer as pt c/o lips being dry. Pt then returned to supine with supervision needing to "take a minute." Pt agreeable to wash up returning to sitting with supervision. Set pt up with wash basin while pt completing  UB bathing with supervision.  Called in NT to assist with bathing.  pt left seated EOB with RN and NT present.                  Therapy Documentation Precautions:  Precautions Precautions: Fall, Other (comment) Precaution Comments: cortrak; right leg discomfort - PTA Restrictions Weight Bearing Restrictions: No  Pain: no pain reported during sessions      Therapy/Group: Individual Therapy  Corinne Ports [facility] 12/21/2021, 8:56 AM

## 2021-12-22 DIAGNOSIS — I10 Essential (primary) hypertension: Secondary | ICD-10-CM

## 2021-12-22 DIAGNOSIS — D72829 Elevated white blood cell count, unspecified: Secondary | ICD-10-CM

## 2021-12-22 LAB — GLUCOSE, CAPILLARY
Glucose-Capillary: 55 mg/dL — ABNORMAL LOW (ref 70–99)
Glucose-Capillary: 119 mg/dL — ABNORMAL HIGH (ref 70–99)
Glucose-Capillary: 253 mg/dL — ABNORMAL HIGH (ref 70–99)
Glucose-Capillary: 176 mg/dL — ABNORMAL HIGH (ref 70–99)
Glucose-Capillary: 153 mg/dL — ABNORMAL HIGH (ref 70–99)
Glucose-Capillary: 159 mg/dL — ABNORMAL HIGH (ref 70–99)

## 2021-12-22 LAB — BASIC METABOLIC PANEL
Anion gap: 11 (ref 5–15)
BUN: 32 mg/dL — ABNORMAL HIGH (ref 8–23)
CO2: 24 mmol/L (ref 22–32)
Calcium: 8.6 mg/dL — ABNORMAL LOW (ref 8.9–10.3)
Chloride: 102 mmol/L (ref 98–111)
Creatinine, Ser: 3.07 mg/dL — ABNORMAL HIGH (ref 0.44–1.00)
GFR, Estimated: 15 mL/min — ABNORMAL LOW (ref >60)
Glucose, Bld: 148 mg/dL — ABNORMAL HIGH (ref 70–99)
Potassium: 4.5 mmol/L (ref 3.5–5.1)
Sodium: 137 mmol/L (ref 135–145)

## 2021-12-22 LAB — CBC WITH DIFFERENTIAL/PLATELET
Abs Immature Granulocytes: 0.15 10*3/uL — ABNORMAL HIGH (ref 0.00–0.07)
Basophils Absolute: 0.1 10*3/uL (ref 0.0–0.1)
Basophils Relative: 1 %
Eosinophils Absolute: 0.3 10*3/uL (ref 0.0–0.5)
Eosinophils Relative: 2 %
HCT: 23.3 % — ABNORMAL LOW (ref 36.0–46.0)
Hemoglobin: 7.4 g/dL — ABNORMAL LOW (ref 12.0–15.0)
Immature Granulocytes: 1 %
Lymphocytes Relative: 16 %
Lymphs Abs: 2 10*3/uL (ref 0.7–4.0)
MCH: 28 pg (ref 26.0–34.0)
MCHC: 31.8 g/dL (ref 30.0–36.0)
MCV: 88.3 fL (ref 80.0–100.0)
Monocytes Absolute: 1.3 10*3/uL — ABNORMAL HIGH (ref 0.1–1.0)
Monocytes Relative: 10 %
Neutro Abs: 9 10*3/uL — ABNORMAL HIGH (ref 1.7–7.7)
Neutrophils Relative %: 70 %
Platelets: 57 10*3/uL — ABNORMAL LOW (ref 150–400)
RBC: 2.64 MIL/uL — ABNORMAL LOW (ref 3.87–5.11)
RDW: 15 % (ref 11.5–15.5)
WBC: 12.8 10*3/uL — ABNORMAL HIGH (ref 4.0–10.5)
nRBC: 0 % (ref 0.0–0.2)

## 2021-12-22 LAB — LACTATE DEHYDROGENASE: LDH: 253 U/L — ABNORMAL HIGH (ref 98–192)

## 2021-12-22 MED ORDER — APIXABAN 2.5 MG PO TABS
2.5000 mg | ORAL_TABLET | Freq: Two times a day (BID) | ORAL | Status: DC
  Administered 2021-12-22 (×2): 2.5 mg via ORAL
  Filled 2021-12-22 (×3): qty 1

## 2021-12-22 NOTE — Progress Notes (Signed)
Thankfully, the platelet count is trending upward.  It was 57,000 this morning.  Again, I suspect this thrombocytopenia was secondary to this Enterococcus infection.  This is being treated with IV antibiotics with ampicillin and possibly switched over to amoxicillin.  She has had no bleeding.  She is doing okay with dialysis.  We will continue to monitor the platelet count.  Again, I am just happy that she is not having problems with this.  Clearly, her bone marrow is incredibly sensitive to any "stress" that occurs to her body and her platelets dropped quickly.  I am realize that she is anemic.  Again this is from her renal insufficiency.  I probably get her back on the blood thinner now.  Again she is at significant risk for thromboembolic disease.  We still do not know if there is any malignancy in the liver.  This will have to be managed as an outpatient if and when she does go home.  [name], MD  [place]  3:1

## 2021-12-22 NOTE — Progress Notes (Signed)
Inpatient Diabetes Program Recommendations  AACE/ADA: New Consensus Statement on Inpatient Glycemic Control (2015)  Target Ranges:  Prepandial:   less than 140 mg/dL      Peak postprandial:   less than 180 mg/dL (1-2 hours)      Critically ill patients:  140 - 180 mg/dL   Lab Results  Component Value Date   GLUCAP 253 (H) 12/22/2021   HGBA1C 9.0 (H) 10/28/2021    Review of Glycemic Control  Latest Reference Range & Units 12/21/21 20:21 12/21/21 23:58 12/22/21 04:13 12/22/21 05:08 12/22/21 07:50  Glucose-Capillary 70 - 99 mg/dL 134 (H) 208 (H) 55 (L) 119 (H) 253 (H)  (H): Data is abnormally high (L): Data is abnormally low  Inpatient Diabetes Program Recommendations:   -Decrease Novolog correction to 0-9 units q 4 hrs.  Thank you, Nani Gasser. Charnelle Bergeman, RN, MSN, CDE  Diabetes Coordinator Inpatient Glycemic Control Team Team Pager [phone] (8am-5pm) 12/22/2021 9:41 AM

## 2021-12-22 NOTE — Progress Notes (Signed)
Physical Therapy Session Note  Patient Details  Name: [name] MRN: [id number] Date of Birth: 1951/07/16  Today's Date: 12/22/2021 PT Individual Time: 1245-8099 PT Individual Time Calculation (min): 58 min   Short Term Goals: Week 3:  PT Short Term Goal 1 (Week 3): =LTG due to ELOS  Skilled Therapeutic Interventions/Progress Updates:    Pt received R sidelying in bed with her daughter, [name], present and pt requiring significant gentle encouragement from therapist and daughter to participate, and is eventually agreeable. Nurse notified to disconnect IV for session and therapist utilized this as motivation to transition from supine>sitting EOB to allow nurse easier access to IV site - pt requires significantly increased time, encouragement, and soothing to initiate this mobility task. Pt's daughter re-iterated to nurse that pt requests to have anti-anxiety medications ~30 minutes prior to therapy sessions as this allows pt to have increased participation. Supine>sitting R EOB, with light min assist for bringing trunk upright. Throughout session pt noted to have tremors/shaking in her hands with movement - unsure if this is due to anxiety or other cause.  Pt continues to report increased R ankle pain, especially with WBing (pt favors it a lot during transfers) - noticed pt has decreased ankle DF ROM and would benefit from gentle stretches/PROM to improve this as pt has difficulty bringing R foot back underneath her BOS when going to stand due to this.  Sitting EOB donned tennis shoes total assist for time management. Pt reports sudden urge to use bathroom, which serves as a motivator for pt to progress to OOB mobility.  Provided pt with RW and cued for stand pivot transfer to w/c; however, pt starts to perform L lateral scoot/partial squat pivot transfer therefore therapist rearranged environment/DME set-up for this and pt able to transfer with mod assist.  Transported in/out bathroom in  w/c. L partial stand pivot w/c>BSC over toilet using grab bar and armrest support with mod assist for lifting and pivoting hips while maintaining balance. Standing with min assist for balance managed LB clothing with max assist. Continent of bladder and bowels - seated peri-care with set-up assist and therapist follow-up for cleanliness on pt request. R partial stand pivot BSC over toilet>w/c using UE support on grab bar and w/c armrest with cuing for head/hips relationship and rotating hips - mod assist.   Seated in w/c performed hand hygiene without assist.  Transported to/from gym in w/c for time management and energy conservation. Pt's daughter remained present during session to serve as motivation.   Partial stand pivot w/c<>Nustep using B UE support on armrests with mod assist for lifting/pivoting hips. Performed B LE reciprocal movement pattern on Nustep against level 3 resistance for 5 minutes totaling 183 steps - goal of targeting increased B LE strength and cardiopulmonary endurance in a non-weightbearing position - pt noticed to lack significant R ankle DF ROM during this task not able to keep heel flat on the pedal and reporting pain as the foot plate came closer resulting in ankle DF ROM.  Transported back to room. R squat pivot as described above. Doffed tennis shoes total assist.Sit>supine with CGA. Pt left supine in bed with needs in reach, her daughter present, and bed alarm on.   Therapy Documentation Precautions:  Precautions Precautions: Fall, Other (comment) Precaution Comments: cortrak; right leg discomfort - PTA Restrictions Weight Bearing Restrictions: No   Pain:  Reports R ankle pain; however, declines medication administration at this time - therapist provided rest breaks and limited [place] tasks  by modifying treatment for pain management.    Therapy/Group: Individual Therapy  Tawana Scale , PT, DPT, NCS, CSRS 12/22/2021, 3:24 PM

## 2021-12-22 NOTE — Progress Notes (Signed)
PROGRESS NOTE   Subjective/Complaints: Patient seen lying in bed this morning.  She states she slept well overnight.  She denies complaints.  No reported issues overnight.  She was seen by cardiology yesterday, notes reviewed-TEE canceled due to thrombocytopenia, ultimately rescheduled as outpatient.  She was seen by nephrology yesterday, notes reviewed-line holiday.  She was seen by ID yesterday, notes reviewed-antibiotics started.  ROS: Limited by cognition/poor memory   Objective:   IR Removal Tun Cv Cath W/O FL  Result Date: 12/20/2021 INDICATION: Bacteremia in patient with tunnel hemodialysis catheter in place. Request for tunneled dialysis catheter removal. EXAM: REMOVAL OF TUNNELED HEMODIALYSIS CATHETER MEDICATIONS: 7 mL 1 % lidocaine COMPLICATIONS: None immediate. PROCEDURE: Informed written consent was obtained from the patient following an explanation of the procedure, risks, benefits and alternatives to treatment. A time out was performed prior to the initiation of the procedure. Sterile technique was utilized including mask, sterile gloves, sterile drape, and hand hygiene. Hibiclens solution was used to prep the patient's right neck, chest and existing catheter. 1% lidocaine was injected around the catheter and the subcutaneous tunnel. The catheter was dissected out using scissors and curved hemostats until the cuff was freed from the surrounding fibrous sheath. The catheter was removed intact. Hemostasis was obtained with manual compression. A dressing was placed. The patient tolerated the procedure well without immediate post procedural complication. IMPRESSION: Successful removal of tunneled dialysis catheter. Read by: Durenda Guthrie, PA-C Electronically Signed   By: Corrie Mckusick D.O.   On: 12/20/2021 13:51   Recent Labs    12/21/21 0617 12/22/21 0550  WBC 14.5* 12.8*  HGB 8.0* 7.4*  HCT 24.2* 23.3*  PLT 46* 57*    Recent  Labs    12/21/21 0617 12/22/21 0550  NA 136 137  K 4.1 4.5  CL 100 102  CO2 25 24  GLUCOSE 91 148*  BUN 26* 32*  CREATININE 2.55* 3.07*  CALCIUM 8.9 8.6*     Intake/Output Summary (Last 24 hours) at 12/22/2021 1250 Last data filed at 12/22/2021 1200 Gross per 24 hour  Intake 1078 ml  Output 400 ml  Net 678 ml      Pressure Injury 12/04/21 Head Posterior Unstageable - Full thickness tissue loss in which the base of the injury is covered by slough (yellow, tan, gray, green or brown) and/or eschar (tan, brown or black) in the wound bed. 3 cm round, scabbed area to po (Active)  12/04/21 2000  Location: Head  Location Orientation: Posterior  Staging: Unstageable - Full thickness tissue loss in which the base of the injury is covered by slough (yellow, tan, gray, green or brown) and/or eschar (tan, brown or black) in the wound bed.  Wound Description (Comments): 3 cm round, scabbed area to posterior head.  Present on Admission: Yes    Physical Exam: Vital Signs Blood pressure (!) 123/59, pulse (!) 106, temperature 97.7 F (36.5 C), temperature source Oral, resp. rate 16, height 5\' 5"  (1.651 m), weight 68.2 kg, SpO2 100 %. General: awake, alert, appropriate, NAD. HENT: conjugate gaze; oropharynx dry- cortrak in place CV: regular rate; no JVD Pulmonary: CTA B/L; no W/R/R- good air movement GI: soft, NT,  ND, (+)BS; hypoactive Psychiatric: appropriate Neurological: Alert Motor:  Bilateral lower extremities: LE 3/5 HF/KE R foot drop- at rest in PF Skin: unstageable wound with scabbing on back of head 3-4cm in diameter- also has popped/dry blister on L sternum- slough gone from base- -no change on head wound today- large scab/thick on back of head  Assessment/Plan: 1. Functional deficits which require 3+ hours per day of interdisciplinary therapy in a comprehensive inpatient rehab setting. Physiatrist is providing close team supervision and 24 hour management of active medical  problems listed below. Physiatrist and rehab team continue to assess barriers to discharge/monitor patient progress toward functional and medical goals  Care Tool:  Bathing    Body parts bathed by patient: Right arm, Left arm, Chest, Abdomen   Body parts bathed by helper: Front perineal area, Buttocks, Right lower leg, Left lower leg     Bathing assist Assist Level: Supervision/Verbal cueing     Upper Body Dressing/Undressing Upper body dressing   What is the patient wearing?: Pull over shirt    Upper body assist Assist Level: Minimal Assistance - Patient > 75%    Lower Body Dressing/Undressing Lower body dressing      What is the patient wearing?: Pants, Underwear/pull up     Lower body assist Assist for lower body dressing: Maximal Assistance - Patient 25 - 49%     Toileting Toileting Toileting Activity did not occur (Clothing management and hygiene only): N/A (no void or bm)  Toileting assist Assist for toileting: Moderate Assistance - Patient 50 - 74%     Transfers Chair/bed transfer  Transfers assist     Chair/bed transfer assist level: Moderate Assistance - Patient 50 - 74%     Locomotion Ambulation   Ambulation assist   Ambulation activity did not occur: Safety/medical concerns  Assist level: 2 helpers Assistive device: Walker-rolling Max distance: 10'   Walk 10 feet activity   Assist  Walk 10 feet activity did not occur: Safety/medical concerns  Assist level: 2 helpers Assistive device: Walker-rolling   Walk 50 feet activity   Assist Walk 50 feet with 2 turns activity did not occur: Safety/medical concerns         Walk 150 feet activity   Assist Walk 150 feet activity did not occur: Safety/medical concerns         Walk 10 feet on uneven surface  activity   Assist Walk 10 feet on uneven surfaces activity did not occur: Safety/medical concerns         Wheelchair     Assist Is the patient using a wheelchair?:  Yes Type of Wheelchair: Manual    Wheelchair assist level: Supervision/Verbal cueing Max wheelchair distance: 72'    Wheelchair 50 feet with 2 turns activity    Assist        Assist Level: Supervision/Verbal cueing   Wheelchair 150 feet activity     Assist      Assist Level: Dependent - Patient 0%   Blood pressure (!) 123/59, pulse (!) 106, temperature 97.7 F (36.5 C), temperature source Oral, resp. rate 16, height 5\' 5"  (1.651 m), weight 68.2 kg, SpO2 100 %.  Medical Problem List and Plan: 1. Functional deficits secondary to debility related to VF cardiac arrest secondary to massive pulmonary emboli status post mechanical thrombectomy             Continue CIR  2.  Antithrombotics: -DVT/anticoagulation:  Pharmaceutical: Other (comment) Eliquis- has to use due to saddle PE 2/23- stopped due  to plts by Heme/Onc             -antiplatelet therapy: N/A 3. Pain Management: Neurontin 100 mg twice daily, oxycodone as needed  2/9- had been on gabapentin for nerve pain-will stop due to sedation- will start RLE lidoderm patch 2 patches for pain.   2/13  more alert off gabapentin in spite of being on at home prior for neuropathy  2/22- has oxy prn for RLE pain- using intermittently.   Controlled with meds on 2/25 4. Mood anxiety-  Cymbalta 20 mg daily, Xanax 0.25 mg daily as needed provide emotional support  2/21- change to Paxil 20 mg nightly- 2/24- no side effects noted per pt- flat, but looks less anxious              -antipsychotic agents: N/A 5. Neuropsych: This patient is not capable of making decisions on her own behalf. 6. Skin/Wound Care: Routine skin checks 7. Fluids/Electrolytes/Nutrition: still not eating much  -continue TF for now.   -hopefully po intake will pick up as mentation improves.   2/20- family wants PEG_ pt kept saying doesn't 2/21- family still wants PEG when possible  2/23- wil do once bacteremia addressed 8.  Pericasular hepatic /hemorrhagic  shock/acute blood loss anemia/status post left liver lobe embolization  Hgb down again to 7.7 repeat non contrast CT abd no change in pericapsular hepatic bleed , check stool OB  2/24- Hb 8.0- will con't to monitor 9.  AKI secondary to ATN.  Follow-up per renal services.  Started clip for AKI- not ESRD, however Cr almost 5 today- getting HD tomorrow 2/8.   2/9- last Cr 5.74- up from 4.97 up from 4.45- rising- per renal- likely HD tomorrow?.  2/24- HD catheter removed due to bacteremia- renal following for possible more HD Appreciate nephro recs 10.  Thrombocytopenia.  Suspect ITP.  Follow-up per hematology 2/21- Plts were 214- down to 17k- will transfuse and cal Heme/Onc- 2/22- plts up to 27k- this Am after transfusion- Heme/Onc not sure why drop in plts- however said no transfusion unless plts drop<10k  2.23- plts 33k this AM- doing better 2/24- Plts 46k- off Eliquis per Heme/Onc- had saddle PE- but risks/benefits require her ot be off Eliquis 2/25 platelets 57, continue to monitor 11.  Hyperlipidemia.  Lipitor 12. Diabetes- Poor appetite- has Cortrak for intake due to poor appetite-stop insulin- was on at home- levemir 4 units BID when came to rehab, but dropping CBGs- so will hold- will do CBGs BID Will assess if still needs- d/c insulin due to Tfs  2/11- TF's restarted so CBGs going up- 486 this AM- restarted Levemir 4 units BID and SSI- will titrate as required.    CBG (last 3)  Recent Labs    12/22/21 0508 12/22/21 0750 12/22/21 1135  GLUCAP 119* 253* 176*   Increased Levimir to 10U q12h  Changed to moderate SSI q 4h since oral intake is low   Levemir at 14 units BID  2/22- Cbgs still really elevated in 200s-300s- will add Novolog coverage per Dm coordinator 3 units at 8pm, midnight and 4am-   2/23- DM coordinator wants pt to increase coverage to 6 units with TF's.   2/24- CBGs labile, however finally have 83 this AM and 149 overnight- will con't regimen and titrate as  required  2/25-CBGs remain labile, monitor for trend 13. New onset confusion/poor memory- anoxia? From prolonged CPR  2/9- will check CT of head- and stop Gabapentin for nerve pain due to  increased sedation- had before but so out of it per daughter- much worse last 2 days.   2/15- variable -tangential and  poor memory- will need to d/w daughter  2/21- still confused, but acutely medically ill still- is improving  2/24- poor memory due to prolonged CPR initially, however is doing better- 14. ABLA with chronic anemia  2/13 hb stable at 8.2  2/21- Hb stable at 8.0  2/25-hemoglobin 7.4, continue to monitor 15. Scab on back of head-pressure ulcer unstageable/Blister on sternum   2/24- scab unstageable on back of head and blister almost healed on L breast.  16. HTN with hx of VF arrest  2/10- pt's BP 178/95- running 431V/400Q- systolic- will d/w renal since not on any BP meds-   2/11- Started norvasc and BP looks better 676 systolic- will con't to monitor trend. 2/24- BP controlled- con't regimen    Vitals:   12/21/21 1958 12/22/21 0416  BP: 123/63 (!) 123/59  Pulse: 96 (!) 106  Resp: 16 16  Temp: 98.8 F (37.1 C) 97.7 F (36.5 C)  SpO2: 100% 100%   Controlled on 2/25  17. Constipation  2/24- LBM last night- con't to monitor 18. Leukocytosis-UTI/Bacteremia- Klebsiella and E Fecalis   2/15- will remove foley and bladder scan; also check U/A and Cx- foley was per renal .   2/17- pt has UTI- started on Cefepime yesterday- also WBC up to 17k  2/18 WBC down to 13.2K , recheck in am   2/20- WBC up to 18.3k- due to HD catheter and no improvement- actually worse, d/w ID and pharmacy- agreed to change cefepime to rocephin- also start Vanc and check blood Cx's.   2/21- changed to Ampicillin with ID discussion- off Vanc due to E fecalis   2/22- WBC still 16.4- will check daily fo rnow  2/23- WBC 14.2- coming down  2/24- WBC 14.5- Neutrophils 70% so not a left shift- stable-   2/25-WBC 12.8,  improving 19. Fall  2/16- pt on floor last night- denied pain- CT head was ordered - pt refused to keep on mask to go downstairs- will get this AM.  20. Poor appetite-  2/17- will get PEG placed next week so can remove Cortrak- spoke with PA to get ordered  2/20- order in, but cannot do when ill/sick/  2/21- wait on PEG- con't cortrak  2/22- pt eating entire banana this AM- which is new  LOS: 18 days A FACE TO FACE EVALUATION WAS PERFORMED  Kshawn Canal Lorie Phenix 12/22/2021, 12:50 PM

## 2021-12-22 NOTE — Progress Notes (Signed)
Hypoglycemic Event  CBG: 55  Treatment: 4 oz juice/soda  Symptoms: None  Follow-up CBG: SFKC:1275 CBG Result:119  Possible Reasons for Event: Unknown  Comments/MD notified:     Harrold Donath

## 2021-12-22 NOTE — Progress Notes (Signed)
[facility] Progress Note   Subjective:  Seen in room. Family present say that in the last few days she has made significant improvement in her strength and overall appearance. UOP 400 cc today. I/O +1700 today. Creat up to 3.0 today. BUN 32, K 4.5.   Objective Vitals:   12/21/21 1958 12/22/21 0416 12/22/21 1257 12/22/21 2008  BP: 123/63 (!) 123/59 124/64 (!) 126/57  Pulse: 96 (!) 106 99 99  Resp: 16 16 17    Temp: 98.8 F (37.1 C) 97.7 F (36.5 C) 99.2 F (37.3 C) 98.1 F (36.7 C)  TempSrc: Oral Oral Oral   SpO2: 100% 100% 100% 100%  Weight:  68.2 kg    Height:       Physical Exam General: Well appearing woman, NAD. Room air Heart: RRR; no murmur Lungs: CTAB; no rales or wheezing Extremities: No LE edema Dialysis Access: TDC in R chest  Dialysis Orders: Establishing AKI orders. Has outpatient spot at [facility] TTS 2nd shift, will change schedule when getting closer to discharge.     Assessment/Plan: Severe AKI: HD dependent, felt d/t ischemic ATN in setting of cardiac arrest, shock, IV contrast. Underlying CKD with baseline Cr 1.3 in 2021. Dialysis started here 10/31/2021 (CRRT initially). Getting HD here on MWF schedule. Last HD was 2/22 and TDC removed 2/23 due to bacteremia. Will monitor labs / exam to determine when next access/ HD needed. Creat up 3.0 today, no signs of uremia. Doing well. Will follow.    Fever/Enterococcus bacteremia: Blood Cx 2/20 +. Now on IV ampicillin. Repeat Blood Cx negative. ID following. Getting HD line holiday now.   Thrombocytopenia: Plts dropped 5K range, HIT was negative. Suspected ITP s/p Nplate and steroids. Seen by ONC. S/p BMBx without malignancy. Plts improved. Recently recurred w/ plts down to 17K. Seen by hematology, suspect due to acute infection. Plts continue to improve.   V-fib cardiac arrest: Outside of hospital d/t PE (on admit).  Massive "saddle" PE s/p mechanical thrombectomy 10/29/21: Vascular US without DVT. Did have  partial hypercoaguable work-up with + lupus anticoagulant. On Eliqius.  Retroperitoneal bleed/hemorrhagic shock/ABLA: Originating from L liver lobe, s/p embolization 1/4, also required FFP, plts, Vit K, PRBCs; now stable.  Acute hypoxic respiratory failure (resolved): D/t #2, extubated 11/07/2021.  T2DM/hyperglycemia: Per primary team.  Encephalopathy: Likely multifactorial, improved/resolved.  Anemia (CKD, prior ABLA): Hgb 8. S/p 21U PRBCs this admit. Heme previously had not recommended ESA d/t likely hypercoagulable state.    Secondary hyperparathyroidism: CorrCa, Phos ok without binders. PTH 129 11/22/21 - no VDRA for now.   Nutrition: Alb low - ongoing bolus TF's and protein supplements. Considering PEG.   Debility/weakness: Currently in CIR for therapy.   Kelly Splinter, MD 12/21/2021, 12:24 PM     Additional Objective Labs: Basic Metabolic Panel: Recent Labs  Lab 12/18/21 0537 12/19/21 0441 12/21/21 0617 12/22/21 0550  NA 133* 135 136 137  K 4.3 4.1 4.1 4.5  CL 96* 95* 100 102  CO2 28 27 25 24   GLUCOSE 343* 226* 91 148*  BUN 28* 39* 26* 32*  CREATININE 1.96* 2.64* 2.55* 3.07*  CALCIUM 8.7* 9.2 8.9 8.6*  PHOS 3.0 4.0  --   --     Liver Function Tests: Recent Labs  Lab 12/17/21 0531 12/18/21 0537 12/18/21 1121 12/19/21 0441  AST 33  --  41  --   ALT 37  --  48*  --   ALKPHOS 121  --  119  --   BILITOT  0.4  --  0.4  --   PROT 6.7  --  7.2  --   ALBUMIN 2.6* 2.6* 2.7* 3.0*    CBC: Recent Labs  Lab 12/18/21 0832 12/19/21 0441 12/19/21 0442 12/20/21 0650 12/21/21 0617 12/22/21 0550  WBC 16.0* 16.4*  --  14.2* 14.5* 12.8*  NEUTROABS 11.7* 12.2*  --  10.1* 10.2* 9.0*  HGB 8.0* 8.0*  --  7.6* 8.0* 7.4*  HCT 25.3* 24.7*  --  23.6* 24.2* 23.3*  MCV 88.5 87.9  --  87.1 86.7 88.3  PLT 17* 27*   < > 33* 46* 57*   < > = values in this interval not displayed.    Blood Culture    Component Value Date/Time   SDES BLOOD LEFT ANTECUBITAL 12/19/2021 0447    SPECREQUEST  12/19/2021 0447    BOTTLES DRAWN AEROBIC AND ANAEROBIC Blood Culture adequate volume   CULT  12/19/2021 0447    NO GROWTH 3 DAYS Performed at [facility], [place] [address] [place], [place] [address]    REPTSTATUS PENDING 12/19/2021 [phone]   Studies/Results: No results found. Medications:  sodium chloride Stopped (12/19/21 0145)   ampicillin (OMNIPEN) IV 2 g (12/22/21 2114)    ALPRAZolam  0.25 mg Oral Daily   amLODipine  10 mg Oral Q2000   apixaban  2.5 mg Oral BID   atorvastatin  20 mg Oral Daily   B-complex with vitamin C  1 tablet Oral Daily   chlorhexidine  15 mL Mouth Rinse BID   Chlorhexidine Gluconate Cloth  6 each Topical Q0600   dicyclomine  10 mg Oral TID AC   feeding supplement (NEPRO CARB STEADY)  237 mL Oral TID BM   feeding supplement (OSMOLITE 1.5 CAL)  975 mL Per Tube Q24H   feeding supplement (PROSource TF)  45 mL Per Tube BID   folic acid  2 mg Oral Daily   heparin sodium (porcine)  3,200 Units Intracatheter Once   insulin aspart  0-15 Units Subcutaneous Q4H   insulin aspart  6 Units Subcutaneous TID WC   insulin detemir  14 Units Subcutaneous BID   lidocaine  2 patch Transdermal Q24H   megestrol  800 mg Oral Daily   mupirocin cream  1 application Topical BID   pantoprazole  40 mg Oral BID   PARoxetine  20 mg Oral QHS   senna  2 tablet Oral Daily

## 2021-12-23 LAB — GLUCOSE, CAPILLARY
Glucose-Capillary: 110 mg/dL — ABNORMAL HIGH (ref 70–99)
Glucose-Capillary: 143 mg/dL — ABNORMAL HIGH (ref 70–99)
Glucose-Capillary: 163 mg/dL — ABNORMAL HIGH (ref 70–99)
Glucose-Capillary: 165 mg/dL — ABNORMAL HIGH (ref 70–99)
Glucose-Capillary: 177 mg/dL — ABNORMAL HIGH (ref 70–99)
Glucose-Capillary: 208 mg/dL — ABNORMAL HIGH (ref 70–99)

## 2021-12-23 LAB — BASIC METABOLIC PANEL
Anion gap: 11 (ref 5–15)
BUN: 38 mg/dL — ABNORMAL HIGH (ref 8–23)
CO2: 22 mmol/L (ref 22–32)
Calcium: 8.7 mg/dL — ABNORMAL LOW (ref 8.9–10.3)
Chloride: 106 mmol/L (ref 98–111)
Creatinine, Ser: 3.1 mg/dL — ABNORMAL HIGH (ref 0.44–1.00)
GFR, Estimated: 15 mL/min — ABNORMAL LOW (ref >60)
Glucose, Bld: 123 mg/dL — ABNORMAL HIGH (ref 70–99)
Potassium: 4.7 mmol/L (ref 3.5–5.1)
Sodium: 139 mmol/L (ref 135–145)

## 2021-12-23 LAB — LACTATE DEHYDROGENASE: LDH: 269 U/L — ABNORMAL HIGH (ref 98–192)

## 2021-12-23 MED ORDER — APIXABAN 2.5 MG PO TABS: 2.5000 mg | ORAL_TABLET | Freq: Two times a day (BID) | ORAL | Status: DC

## 2021-12-23 NOTE — Progress Notes (Signed)
Physical Therapy Session Note  Patient Details  Name: [name] MRN: [id number] Date of Birth: October 10, 1951  Today's Date: 12/23/2021 PT Individual Time: 1000-1045 PT Individual Time Calculation (min): 45 min   Short Term Goals: Week 3:  PT Short Term Goal 1 (Week 3): =LTG due to ELOS  Skilled Therapeutic Interventions/Progress Updates: Pt presented in bed agreeable to therapy with encouragement. Pt continues to c/o pain in RLE. Pt required encouragement for OOB activity. Pt rolled to L and PTA noted that coretrack not clamped and pt's back, and bed is soiled. Nsg notified, clamp closed and pt performed supine to sit with minA and squat pivot transfer to w/c with modA. With increased time pt doffed shirt with supervision and performed UB bathing at sink with cues for completion of task as several distractions in room at time. Required minA to don clean/dry shirt. Pt noted to not have eaten breakfast and arrived while performing bed mobility. Once completed pt sat in w/c to eat breakfast with supervision. Pt then requesting to return to bed once completed meal. Pt required increased time but was able to perform ambulatory transfer with RW to bed with modA. Pt was able to return to supine with supervision and bed flat. Pt repositioned to sidelying and left with bed alarm on, call bell within reach and needs met.      Therapy Documentation Precautions:  Precautions Precautions: Fall, Other (comment) Precaution Comments: cortrak; right leg discomfort - PTA Restrictions Weight Bearing Restrictions: No General: PT Amount of Missed Time (min): 15 Minutes PT Missed Treatment Reason: Patient fatigue Vital Signs: Therapy Vitals Temp: 98.9 F (37.2 C) Temp Source: Oral Pulse Rate: 96 Resp: 18 BP: (!) 121/56 Patient Position (if appropriate): Lying Oxygen Therapy SpO2: 100 % O2 Device: Room Air Pain: Pain Assessment Pain Scale: 0-10 Pain Score: 0-No pain Faces Pain Scale: Hurts a  little bit Pain Location: Foot Pain Intervention(s): Other (Comment) (declined meds) Mobility:   Locomotion :    Trunk/Postural Assessment :    Balance:   Exercises:   Other Treatments:      Therapy/Group: Individual Therapy  Perla Echavarria 12/23/2021, 4:23 PM

## 2021-12-23 NOTE — Progress Notes (Addendum)
Patient has beem followed by IR for possible G tube placement.   Blood cx no growth x 4 days, pt afebrile.  Chart reviewed, family wants to proceed with G tube placement.  Patient states that she does not want the G tube, but she does not have capacity to make her own medical decisions per primary team's note.   PLAN - Plan to proceed with G tube placement mid week.  - Start holding Eliquis today, last give on 2/25 pm.  - Pt will need contrast prior to G tube placement. - Trend plt, needs to be over 50 per IR protocol.  - Formal consult to follow.    Armando Gang Braison Snoke PA-C 12/23/2021 9:27 AM

## 2021-12-23 NOTE — Progress Notes (Signed)
Pt inadvertently removed IV from right forearm. Small amount of blood present on tshirt. Changed pt's tshirt.

## 2021-12-23 NOTE — Progress Notes (Signed)
Speech Language Pathology Daily Session Note  Patient Details  Name: KEZIA BENEVIDES MRN: [id number] Date of Birth: 09-22-1951  Today's Date: 12/23/2021 SLP Individual Time: 1330-1400 SLP Individual Time Calculation (min): 30 min  Short Term Goals: Week 3: SLP Short Term Goal 1 (Week 3): Pt will demonstrated sustained attention in 5-7 minute intervals with min A verbal cues. SLP Short Term Goal 2 (Week 3): Pt will demonstrate basic problem solving skills with min A verbal cues. SLP Short Term Goal 3 (Week 3): Pt will demonstrate ability to self-monitor and recognize errors in functional tasks with mod A verbal cues. SLP Short Term Goal 4 (Week 3): Pt will demonstrated orientation x4 with min A verbal cues for visual aids. SLP Short Term Goal 5 (Week 3): Pt tolerate dys 3 textures and thin liquids with minimal overt s/s aspiration and supervision A verbal cues for safe swallow strategies.  Skilled Therapeutic Interventions:   Pt was seen for skilled ST targeting goals for dysphagia and cognition.  Pt was sleeping upon arrival but awakened easily to voice and light touch and was agreeable to participating in treatment.  Pt appeared slightly disoriented upon awakening as she responded that she had been to a Dr. Appointment this morning with her primary care physician when asked what she had done today.  SLP provided passive reorientation to place, situation, and rehab physician to maximize carryover of daily information and as session progressed pt's mentation seemed to clear.  SLP facilitated the session with a functional snack of regular textures and thin liquids to assess diet toleration.  Pt demonstrated no overt s/s of aspiration with solids or liquids.  Her mastication of dry textures was slightly prolonged but functional and pt was able to clear bolus from the oral cavity with increased time.  Pt was left in bed with bed alarm set and call bell within reach.  Continue per current plan of care.     Pain Pain Assessment Pain Scale: Faces Faces Pain Scale: Hurts a little bit Pain Location: Foot Pain Intervention(s): Other (Comment) (declined meds)  Therapy/Group: Individual Therapy  Svetlana Bagby, Selinda Orion 12/23/2021, 3:28 PM

## 2021-12-23 NOTE — Progress Notes (Signed)
[facility] Progress Note   Subjective:  Seen in room. In good spirits. 400 cc UOP yesterday. Wts up a little. Creat stable 3.1.   Objective Vitals:   12/22/21 2008 12/23/21 0401 12/23/21 0500 12/23/21 1434  BP: (!) 126/57 133/62  (!) 121/56  Pulse: 99 98  96  Resp:  18  18  Temp: 98.1 F (36.7 C) 98.2 F (36.8 C)  98.9 F (37.2 C)  TempSrc:  Oral  Oral  SpO2: 100% 100%  100%  Weight:   68.7 kg   Height:       Physical Exam General: Well appearing woman, NAD. Room air Heart: RRR; no murmur Lungs: CTAB; no rales or wheezing Extremities: No LE edema Dialysis Access: TDC in R chest  Dialysis Orders: Establishing AKI orders. Has outpatient spot at [facility] TTS 2nd shift, will change schedule when getting closer to discharge.     Assessment/Plan: Severe AKI: HD dependent, felt d/t ischemic ATN in setting of cardiac arrest, shock, IV contrast. Underlying CKD with baseline Cr 1.3 in 2021. Dialysis started here 10/31/2021 w/ CRRT at the start. Getting iHD on MWF schedule here. Last HD was 2/22 and TDC removed 2/23 due to bacteremia. Creat leveling off at 3.1 today. Not sure if recovering some function, will hold off on [facility] replacement for now. Gets labs in am. May need to add diuretic if signs of extra volume. Will follow.    Fever/Enterococcus bacteremia: Blood Cx 2/20 +. Now on IV ampicillin. Repeat Blood Cx negative. ID following. [place] removed on 2/23.   Thrombocytopenia: Plts dropped 5K range, HIT was negative. Suspected ITP s/p Nplate and steroids. Seen by ONC. S/p BMBx without malignancy. Plts improved. Recently recurred w/ plts down to 17K. Seen by hematology, suspect due to acute infection. Plts continue to improve.   V-fib cardiac arrest: Outside of hospital d/t PE (on admit).  Massive "saddle" PE s/p mechanical thrombectomy 10/29/21: Vascular US without DVT. Did have partial hypercoaguable work-up with + lupus anticoagulant. On Eliqius.  Retroperitoneal  bleed/hemorrhagic shock/ABLA: Originating from L liver lobe, s/p embolization 1/4, also required FFP, plts, Vit K, PRBCs; now stable.  Acute hypoxic respiratory failure (resolved): D/t #2, extubated 11/07/2021.  T2DM/hyperglycemia: Per primary team.  Encephalopathy: Likely multifactorial, improved/resolved.  Anemia (CKD, prior ABLA): Hgb 8. S/p 21U PRBCs this admit. Heme previously had not recommended ESA d/t likely hypercoagulable state.    Secondary hyperparathyroidism: CorrCa, Phos ok without binders. PTH 129 11/22/21 - no VDRA for now.   Nutrition: Alb low - ongoing bolus TF's and protein supplements. Considering PEG.   Debility/weakness: Currently in CIR for therapy.   Kelly Splinter, MD 12/21/2021, 12:24 PM     Additional Objective Labs: Basic Metabolic Panel: Recent Labs  Lab 12/18/21 0537 12/19/21 0441 12/21/21 0617 12/22/21 0550 12/23/21 0602  NA 133* 135 136 137 139  K 4.3 4.1 4.1 4.5 4.7  CL 96* 95* 100 102 106  CO2 28 27 25 24 22   GLUCOSE 343* 226* 91 148* 123*  BUN 28* 39* 26* 32* 38*  CREATININE 1.96* 2.64* 2.55* 3.07* 3.10*  CALCIUM 8.7* 9.2 8.9 8.6* 8.7*  PHOS 3.0 4.0  --   --   --     Liver Function Tests: Recent Labs  Lab 12/17/21 0531 12/18/21 0537 12/18/21 1121 12/19/21 0441  AST 33  --  41  --   ALT 37  --  48*  --   ALKPHOS 121  --  119  --  BILITOT 0.4  --  0.4  --   PROT 6.7  --  7.2  --   ALBUMIN 2.6* 2.6* 2.7* 3.0*    CBC: Recent Labs  Lab 12/18/21 0832 12/19/21 0441 12/19/21 0442 12/20/21 0650 12/21/21 0617 12/22/21 0550  WBC 16.0* 16.4*  --  14.2* 14.5* 12.8*  NEUTROABS 11.7* 12.2*  --  10.1* 10.2* 9.0*  HGB 8.0* 8.0*  --  7.6* 8.0* 7.4*  HCT 25.3* 24.7*  --  23.6* 24.2* 23.3*  MCV 88.5 87.9  --  87.1 86.7 88.3  PLT 17* 27*   < > 33* 46* 57*   < > = values in this interval not displayed.    Blood Culture    Component Value Date/Time   SDES BLOOD LEFT ANTECUBITAL 12/19/2021 0447   SPECREQUEST  12/19/2021 0447     BOTTLES DRAWN AEROBIC AND ANAEROBIC Blood Culture adequate volume   CULT  12/19/2021 0447    NO GROWTH 4 DAYS Performed at [facility], [place] [address], [place], [place] [address]    REPTSTATUS PENDING 12/19/2021 [phone]   Studies/Results: No results found. Medications:  sodium chloride Stopped (12/19/21 0145)   ampicillin (OMNIPEN) IV 2 g (12/23/21 1400)    ALPRAZolam  0.25 mg Oral Daily   amLODipine  10 mg Oral Q2000   [START ON 12/27/2021] apixaban  2.5 mg Oral BID   atorvastatin  20 mg Oral Daily   B-complex with vitamin C  1 tablet Oral Daily   chlorhexidine  15 mL Mouth Rinse BID   Chlorhexidine Gluconate Cloth  6 each Topical Q0600   dicyclomine  10 mg Oral TID AC   feeding supplement (NEPRO CARB STEADY)  237 mL Oral TID BM   feeding supplement (OSMOLITE 1.5 CAL)  975 mL Per Tube Q24H   feeding supplement (PROSource TF)  45 mL Per Tube BID   folic acid  2 mg Oral Daily   heparin sodium (porcine)  3,200 Units Intracatheter Once   insulin aspart  0-15 Units Subcutaneous Q4H   insulin aspart  6 Units Subcutaneous TID WC   insulin detemir  14 Units Subcutaneous BID   lidocaine  2 patch Transdermal Q24H   megestrol  800 mg Oral Daily   mupirocin cream  1 application Topical BID   pantoprazole  40 mg Oral BID   PARoxetine  20 mg Oral QHS   senna  2 tablet Oral Daily

## 2021-12-24 ENCOUNTER — Encounter (HOSPITAL_COMMUNITY)
Admission: RE | Disposition: A | Discharge status: Home Health | Payer: Self-pay | Source: Intra-hospital | Attending: Physical Medicine and Rehabilitation

## 2021-12-24 LAB — RENAL FUNCTION PANEL
Albumin: 2.9 g/dL — ABNORMAL LOW (ref 3.5–5.0)
Anion gap: 11 (ref 5–15)
BUN: 41 mg/dL — ABNORMAL HIGH (ref 8–23)
CO2: 24 mmol/L (ref 22–32)
Calcium: 9 mg/dL (ref 8.9–10.3)
Chloride: 104 mmol/L (ref 98–111)
Creatinine, Ser: 3.12 mg/dL — ABNORMAL HIGH (ref 0.44–1.00)
GFR, Estimated: 15 mL/min — ABNORMAL LOW (ref >60)
Glucose, Bld: 160 mg/dL — ABNORMAL HIGH (ref 70–99)
Phosphorus: 4.5 mg/dL (ref 2.5–4.6)
Potassium: 5.2 mmol/L — ABNORMAL HIGH (ref 3.5–5.1)
Sodium: 139 mmol/L (ref 135–145)

## 2021-12-24 LAB — GLUCOSE, CAPILLARY
Glucose-Capillary: 154 mg/dL — ABNORMAL HIGH (ref 70–99)
Glucose-Capillary: 163 mg/dL — ABNORMAL HIGH (ref 70–99)
Glucose-Capillary: 176 mg/dL — ABNORMAL HIGH (ref 70–99)
Glucose-Capillary: 213 mg/dL — ABNORMAL HIGH (ref 70–99)
Glucose-Capillary: 81 mg/dL (ref 70–99)
Glucose-Capillary: 86 mg/dL (ref 70–99)

## 2021-12-24 LAB — CBC WITH DIFFERENTIAL/PLATELET
Abs Immature Granulocytes: 0.12 10*3/uL — ABNORMAL HIGH (ref 0.00–0.07)
Basophils Absolute: 0.1 10*3/uL (ref 0.0–0.1)
Basophils Relative: 1 %
Eosinophils Absolute: 0.3 10*3/uL (ref 0.0–0.5)
Eosinophils Relative: 2 %
HCT: 25.8 % — ABNORMAL LOW (ref 36.0–46.0)
Hemoglobin: 8.3 g/dL — ABNORMAL LOW (ref 12.0–15.0)
Immature Granulocytes: 1 %
Lymphocytes Relative: 16 %
Lymphs Abs: 2.4 10*3/uL (ref 0.7–4.0)
MCH: 28.7 pg (ref 26.0–34.0)
MCHC: 32.2 g/dL (ref 30.0–36.0)
MCV: 89.3 fL (ref 80.0–100.0)
Monocytes Absolute: 1.1 10*3/uL — ABNORMAL HIGH (ref 0.1–1.0)
Monocytes Relative: 8 %
Neutro Abs: 10.9 10*3/uL — ABNORMAL HIGH (ref 1.7–7.7)
Neutrophils Relative %: 72 %
Platelets: 98 10*3/uL — ABNORMAL LOW (ref 150–400)
RBC: 2.89 MIL/uL — ABNORMAL LOW (ref 3.87–5.11)
RDW: 15.4 % (ref 11.5–15.5)
WBC: 14.9 10*3/uL — ABNORMAL HIGH (ref 4.0–10.5)
nRBC: 0 % (ref 0.0–0.2)

## 2021-12-24 LAB — CULTURE, BLOOD (ROUTINE X 2)
Culture: NO GROWTH
Culture: NO GROWTH
Special Requests: ADEQUATE
Special Requests: ADEQUATE

## 2021-12-24 LAB — LACTATE DEHYDROGENASE: LDH: 276 U/L — ABNORMAL HIGH (ref 98–192)

## 2021-12-24 SURGERY — ECHOCARDIOGRAM, TRANSESOPHAGEAL
Anesthesia: Monitor Anesthesia Care

## 2021-12-24 NOTE — Progress Notes (Signed)
Speech Language Pathology Daily Session Note  Patient Details  Name: [name] MRN: [id number] Date of Birth: Jul 10, 1951  Today's Date: 12/24/2021 SLP Individual Time: 0900-0929 SLP Individual Time Calculation (min): 29 min  Short Term Goals: Week 3: SLP Short Term Goal 1 (Week 3): Pt will demonstrated sustained attention in 5-7 minute intervals with min A verbal cues. SLP Short Term Goal 2 (Week 3): Pt will demonstrate basic problem solving skills with min A verbal cues. SLP Short Term Goal 3 (Week 3): Pt will demonstrate ability to self-monitor and recognize errors in functional tasks with mod A verbal cues. SLP Short Term Goal 4 (Week 3): Pt will demonstrated orientation x4 with min A verbal cues for visual aids. SLP Short Term Goal 5 (Week 3): Pt tolerate dys 3 textures and thin liquids with minimal overt s/s aspiration and supervision A verbal cues for safe swallow strategies.  Skilled Therapeutic Interventions:Skilled ST services focused on cognitive skills. Pt was asleep upon entering and stated " I dont feel well" when SLP woke her up. Pt reported 10/10 pain in foot and demonstrated impaired thinking skills, not wanting to request pain medication " I want to save it for tonight." SLP provided education pertaining to pain medication, given every 4 hour and pt was able to press call bell when directed. SLP notified RN. Pt was orientated general situation " I fell" and agreeable to diagnosis of CVA, orientated to place and time expect for day/date. Pt requested to end ST services due to pain and SLP provided education pertaining to need to participate in rehab treatments. Pt missed 30 minutes of skilled ST treatment. Pt was left in room with call bell within reach and bed alarm set. SLP recommends to continue skilled services.     Pain Pain Assessment Pain Scale: 0-10 Pain Score: 10-Worst pain ever Faces Pain Scale: Hurts little more Pain Type: Acute pain Pain Location: Foot Pain  Orientation: Posterior;Right Pain Descriptors / Indicators: Aching Pain Frequency: Intermittent Pain Onset: On-going Patients Stated Pain Goal: 4 Pain Intervention(s): RN made aware;Rest Multiple Pain Sites: No  Therapy/Group: Individual Therapy  Kelcey Wickstrom  [facility] 12/24/2021, 12:34 PM

## 2021-12-24 NOTE — Progress Notes (Signed)
[facility] Progress Note   Subjective:  Appears that Cr is trying to plateau  Objective Vitals:   12/23/21 2031 12/24/21 0313 12/24/21 0515 12/24/21 1251  BP: (!) 145/57 (!) 146/76  (!) 154/79  Pulse: 94 (!) 101  89  Resp:  18  18  Temp:  98.6 F (37 C)  98 F (36.7 C)  TempSrc:  Oral  Oral  SpO2:  100%  100%  Weight:   64.1 kg   Height:       Physical Exam General: Well appearing woman, NAD. Room air Heart: RRR; no murmur Lungs: CTAB; no rales or wheezing Extremities: No LE edema Dialysis Access: TDC in R chest  Dialysis Orders: Establishing AKI orders. Has outpatient spot at [facility] TTS 2nd shift, will change schedule when getting closer to discharge.     Assessment/Plan: Severe AKI: HD dependent, felt d/t ischemic ATN in setting of cardiac arrest, shock, IV contrast. Underlying CKD with baseline Cr 1.3 in 2021. Dialysis started here 10/31/2021 w/ CRRT at the start. Getting iHD on MWF schedule here. Last HD was 2/22 and TDC removed 2/23 due to bacteremia. Creat leveling off at 3.1 today. ? If recovering.  Will hold off on [facility] placement and follow labs.     Fever/Enterococcus bacteremia: Blood Cx 2/20 +. Now on IV ampicillin. Repeat Blood Cx negative. ID following. [place] removed on 2/23.   Thrombocytopenia: Plts dropped 5K range, HIT was negative. Suspected ITP s/p Nplate and steroids. Seen by ONC. S/p BMBx without malignancy. Plts improved. Recently recurred w/ plts down to 17K. Seen by hematology, suspect due to acute infection. Plts continue to improve.   V-fib cardiac arrest: Outside of hospital d/t PE (on admit).  Massive "saddle" PE s/p mechanical thrombectomy 10/29/21: Vascular US without DVT. Did have partial hypercoaguable work-up with + lupus anticoagulant. On Eliqius.  Retroperitoneal bleed/hemorrhagic shock/ABLA: Originating from L liver lobe, s/p embolization 1/4, also required FFP, plts, Vit K, PRBCs; now stable.  Acute hypoxic respiratory failure  (resolved): D/t #2, extubated 11/07/2021.  T2DM/hyperglycemia: Per primary team.  Encephalopathy: Likely multifactorial, improved/resolved.  Anemia (CKD, prior ABLA): Hgb 8. S/p 21U PRBCs this admit. Heme previously had not recommended ESA d/t likely hypercoagulable state.    Secondary hyperparathyroidism: CorrCa, Phos ok without binders. PTH 129 11/22/21 - no VDRA for now.   Nutrition: Alb low - ongoing bolus TF's and protein supplements. Considering PEG.   Debility/weakness: Currently in CIR for therapy.   Madelon Lips, MD 12/21/2021, 12:24 PM     Additional Objective Labs: Basic Metabolic Panel: Recent Labs  Lab 12/18/21 0537 12/19/21 0441 12/21/21 0617 12/22/21 0550 12/23/21 0602  NA 133* 135 136 137 139  K 4.3 4.1 4.1 4.5 4.7  CL 96* 95* 100 102 106  CO2 28 27 25 24 22   GLUCOSE 343* 226* 91 148* 123*  BUN 28* 39* 26* 32* 38*  CREATININE 1.96* 2.64* 2.55* 3.07* 3.10*  CALCIUM 8.7* 9.2 8.9 8.6* 8.7*  PHOS 3.0 4.0  --   --   --    Liver Function Tests: Recent Labs  Lab 12/18/21 0537 12/18/21 1121 12/19/21 0441  AST  --  41  --   ALT  --  48*  --   ALKPHOS  --  119  --   BILITOT  --  0.4  --   PROT  --  7.2  --   ALBUMIN 2.6* 2.7* 3.0*   CBC: Recent Labs  Lab 12/18/21 0832 12/19/21 0441 12/19/21  4944 12/20/21 0650 12/21/21 0617 12/22/21 0550  WBC 16.0* 16.4*  --  14.2* 14.5* 12.8*  NEUTROABS 11.7* 12.2*  --  10.1* 10.2* 9.0*  HGB 8.0* 8.0*  --  7.6* 8.0* 7.4*  HCT 25.3* 24.7*  --  23.6* 24.2* 23.3*  MCV 88.5 87.9  --  87.1 86.7 88.3  PLT 17* 27*   < > 33* 46* 57*   < > = values in this interval not displayed.   Blood Culture    Component Value Date/Time   SDES BLOOD LEFT ANTECUBITAL 12/19/2021 0447   SPECREQUEST  12/19/2021 0447    BOTTLES DRAWN AEROBIC AND ANAEROBIC Blood Culture adequate volume   CULT  12/19/2021 0447    NO GROWTH 5 DAYS Performed at [facility], [place] [address] [place], [place] [address]    REPTSTATUS  12/24/2021 FINAL 12/19/2021 0447   Studies/Results: No results found. Medications:  sodium chloride Stopped (12/19/21 0145)   ampicillin (OMNIPEN) IV 2 g (12/24/21 0516)    ALPRAZolam  0.25 mg Oral Daily   amLODipine  10 mg Oral Q2000   [START ON 12/27/2021] apixaban  2.5 mg Oral BID   atorvastatin  20 mg Oral Daily   B-complex with vitamin C  1 tablet Oral Daily   chlorhexidine  15 mL Mouth Rinse BID   Chlorhexidine Gluconate Cloth  6 each Topical Q0600   dicyclomine  10 mg Oral TID AC   feeding supplement (NEPRO CARB STEADY)  237 mL Oral TID BM   feeding supplement (OSMOLITE 1.5 CAL)  975 mL Per Tube Q24H   feeding supplement (PROSource TF)  45 mL Per Tube BID   folic acid  2 mg Oral Daily   heparin sodium (porcine)  3,200 Units Intracatheter Once   insulin aspart  0-15 Units Subcutaneous Q4H   insulin aspart  6 Units Subcutaneous TID WC   insulin detemir  14 Units Subcutaneous BID   lidocaine  2 patch Transdermal Q24H   megestrol  800 mg Oral Daily   mupirocin cream  1 application Topical BID   pantoprazole  40 mg Oral BID   PARoxetine  20 mg Oral QHS   senna  2 tablet Oral Daily

## 2021-12-24 NOTE — Progress Notes (Signed)
Patient ID: [name], female   DOB: [birth year], 71 y.o.   MRN: [id number]  SW returned phone call to pt dtr [name] who felt her mother was cognitively able to complete an HCPOA as pt has been more alert. SW explained that after speaking with medical team, pt is not cognitively able to complete. SW encouraged her to consider applying for interim guardianship with [employer]. She reports that her sister [name] has mental health and is diagnosis with [facility] and is medicated. SW explained unable to verify any concerns, however, encouraged interm guardianship. She also inquired if a PEG is still needed. SW shared will f/u with updates after team conference tomorrow as attending in clinic today. SW will follow-up after team conference tomorrow.   [name], MSW, [place] Office: [phone] Cell: [phone] Fax: [phone]

## 2021-12-24 NOTE — Progress Notes (Signed)
Occupational Therapy Session Note  Patient Details  Name: [name] MRN: [id number] Date of Birth: 1951/05/19  Today's Date: 12/24/2021 OT Individual Time: 1024-1100 OT Individual Time Calculation (min): 36 min  Missed 24 mins d/t fatigue   Short Term Goals: Week 3:  OT Short Term Goal 1 (Week 3): pt will complete UB dressing with MIN A OT Short Term Goal 2 (Week 3): pt will recall correct hand placement for sit<>stand transfers for 3 consecutive OT sessions OT Short Term Goal 3 (Week 3): pt wil maintain sustained attention during 2 consecutive ADL sessions OT Short Term Goal 4 (Week 3): pt will complete dynamic balance tasks during ADLs with MIN A  Skilled Therapeutic Interventions/Progress Updates:  Pt received asleep in supine initially declining OT asking therapists to stop bothering her, provided pt some time to rest and returned 24 mins later with pt agreeable to OT intervention with encouragement. Session focus on BADL reeducation, functional mobility, dynamic standing balance and decreasing overall caregiver burden.   Pt completed supine>sit with CGA with increased time and effort. Pt completed bathing from EOB with overall MOD A as pt needed increased assist for LB bathing in standing. Pt completed UB dressing with set- up assist, MOD A to don brief from supine per pt request.     Pt continues to needed increased rest breaks in between tasks, pt also needed MOD A to stand today d/t posterior lean with Rw. pt left supine in bed with bed alarm activated and all needs within reach.                    Therapy Documentation Precautions:  Precautions Precautions: Fall, Other (comment) Precaution Comments: cortrak; right leg discomfort - PTA Restrictions Weight Bearing Restrictions: No   Pain: no pain reported during session     Therapy/Group: Individual Therapy  Corinne Ports [facility] 12/24/2021, 12:16 PM

## 2021-12-24 NOTE — Progress Notes (Signed)
Physical Therapy Session Note  Patient Details  Name: [name] MRN: [id number] Date of Birth: [birth year]  Today's Date: 12/24/2021 PT Individual Time: 0802-0812 PT Individual Time Calculation (min): 10 min  and Today's Date: 12/24/2021 PT Missed Time: 20 Minutes Missed Time Reason: Patient unwilling to participate  Short Term Goals: Week 3:  PT Short Term Goal 1 (Week 3): =LTG due to ELOS  Skilled Therapeutic Interventions/Progress Updates:     Pt seated at EOB with MD present. No complaint of pain. Had not received anxiety medications and MD reports that pt is feeling extremely anxious. Pt requests to return to supine. PT cues pt to perform stand step transition up to [facility] and pt performs partial scoot. Sit to supine with cues for positioning. Pt left supine in bed with alarm intact and all needs within reach. Pt misses 20 minutes of skilled PT due to anxiety.  Therapy Documentation Precautions:  Precautions Precautions: Fall, Other (comment) Precaution Comments: cortrak; right leg discomfort - PTA Restrictions Weight Bearing Restrictions: No General: PT Amount of Missed Time (min): 45 Minutes PT Missed Treatment Reason: Patient fatigue;Patient unwilling to participate   Therapy/Group: Individual Therapy  Breck Coons, PT, DPT 12/24/2021, 12:41 PM

## 2021-12-24 NOTE — Progress Notes (Signed)
Physical Therapy Session Note  Patient Details  Name: [name] MRN: [id number] Date of Birth: 04/18/1951  Today's Date: 12/24/2021 PT Amount of Missed Time (min): 47 Minutes PT Missed Treatment Reason: Patient fatigue;Patient unwilling to participate  Short Term Goals: Week 3:  PT Short Term Goal 1 (Week 3): =LTG due to ELOS  Skilled Therapeutic Interventions/Progress Updates:    Attempted to see patient for scheduled therapy session. Pt received in L sidelying in bed. Pt reports she feels "jittery" and does not want to participate in therapy session at this time. Pt reports she has received her anxiety medication but just overall does not feel well and declines participation in therapy session. Pt encouraged to sit up to EOB but she declines. Pt missed 45 min of scheduled therapy session due to not feeling well and refusal.  Therapy Documentation Precautions:  Precautions Precautions: Fall, Other (comment) Precaution Comments: cortrak; right leg discomfort - PTA Restrictions Weight Bearing Restrictions: No General: PT Amount of Missed Time (min): 45 Minutes PT Missed Treatment Reason: Patient fatigue;Patient unwilling to participate     Therapy/Group: Individual Therapy   Excell Seltzer, PT, DPT, CSRS  12/24/2021, 12:15 PM

## 2021-12-24 NOTE — Progress Notes (Signed)
PROGRESS NOTE   Subjective/Complaints:  Pt seen sitting up at EOB- leaning on tray table.  Pt reports she's "a nervous wreak" this AM-  Woke up like this- asked to lay down.   Took NT, PT and myself to help her calm down- called nurse to give AM anxiety meds.     ROS: limited by cognition/anxiety   Objective:   No results found. Recent Labs    12/22/21 0550  WBC 12.8*  HGB 7.4*  HCT 23.3*  PLT 57*   Recent Labs    12/22/21 0550 12/23/21 0602  NA 137 139  K 4.5 4.7  CL 102 106  CO2 24 22  GLUCOSE 148* 123*  BUN 32* 38*  CREATININE 3.07* 3.10*  CALCIUM 8.6* 8.7*    Intake/Output Summary (Last 24 hours) at 12/24/2021 2585 Last data filed at 12/23/2021 1325 Gross per 24 hour  Intake 120 ml  Output --  Net 120 ml     Pressure Injury 12/04/21 Head Posterior Unstageable - Full thickness tissue loss in which the base of the injury is covered by slough (yellow, tan, gray, green or brown) and/or eschar (tan, brown or black) in the wound bed. 3 cm round, scabbed area to po (Active)  12/04/21 2000  Location: Head  Location Orientation: Posterior  Staging: Unstageable - Full thickness tissue loss in which the base of the injury is covered by slough (yellow, tan, gray, green or brown) and/or eschar (tan, brown or black) in the wound bed.  Wound Description (Comments): 3 cm round, scabbed area to posterior head.  Present on Admission: Yes    Physical Exam: Vital Signs Blood pressure (!) 146/76, pulse (!) 101, temperature 98.6 F (37 C), temperature source Oral, resp. rate 18, height 5\' 5"  (1.651 m), weight 64.1 kg, SpO2 100 %.   General: awake, alert, sitting EOB leaning on tray table- NT and PT in room;  NAD HENT: conjugate gaze; oropharynx moist CV: regular rhythm; tachycardic rate; no JVD Pulmonary: CTA B/L; no W/R/R- good air movement GI: soft, NT, ND, (+)BS Psychiatric: very anxious- voice  trembling Neurological: alert- trembling in hands severely Motor:  Bilateral lower extremities: LE 3/5 HF/KE R foot drop- at rest in PF Skin: unstageable wound with scabbing on back of head 3-4cm in diameter- also has popped/dry blister on L sternum- slough gone from base- -no change on head wound today- large scab/thick on back of head  Assessment/Plan: 1. Functional deficits which require 3+ hours per day of interdisciplinary therapy in a comprehensive inpatient rehab setting. Physiatrist is providing close team supervision and 24 hour management of active medical problems listed below. Physiatrist and rehab team continue to assess barriers to discharge/monitor patient progress toward functional and medical goals  Care Tool:  Bathing    Body parts bathed by patient: Right arm, Left arm, Chest, Abdomen   Body parts bathed by helper: Front perineal area, Buttocks, Right lower leg, Left lower leg     Bathing assist Assist Level: Supervision/Verbal cueing     Upper Body Dressing/Undressing Upper body dressing   What is the patient wearing?: Pull over shirt    Upper body assist Assist Level: Minimal Assistance -  Patient > 75%    Lower Body Dressing/Undressing Lower body dressing      What is the patient wearing?: Pants, Underwear/pull up     Lower body assist Assist for lower body dressing: Maximal Assistance - Patient 25 - 49%     Toileting Toileting Toileting Activity did not occur (Clothing management and hygiene only): N/A (no void or bm)  Toileting assist Assist for toileting: Moderate Assistance - Patient 50 - 74%     Transfers Chair/bed transfer  Transfers assist     Chair/bed transfer assist level: Moderate Assistance - Patient 50 - 74%     Locomotion Ambulation   Ambulation assist   Ambulation activity did not occur: Safety/medical concerns  Assist level: 2 helpers Assistive device: Walker-rolling Max distance: 10'   Walk 10 feet  activity   Assist  Walk 10 feet activity did not occur: Safety/medical concerns  Assist level: 2 helpers Assistive device: Walker-rolling   Walk 50 feet activity   Assist Walk 50 feet with 2 turns activity did not occur: Safety/medical concerns         Walk 150 feet activity   Assist Walk 150 feet activity did not occur: Safety/medical concerns         Walk 10 feet on uneven surface  activity   Assist Walk 10 feet on uneven surfaces activity did not occur: Safety/medical concerns         Wheelchair     Assist Is the patient using a wheelchair?: Yes Type of Wheelchair: Manual    Wheelchair assist level: Supervision/Verbal cueing Max wheelchair distance: 53'    Wheelchair 50 feet with 2 turns activity    Assist        Assist Level: Supervision/Verbal cueing   Wheelchair 150 feet activity     Assist      Assist Level: Dependent - Patient 0%   Blood pressure (!) 146/76, pulse (!) 101, temperature 98.6 F (37 C), temperature source Oral, resp. rate 18, height 5\' 5"  (1.651 m), weight 64.1 kg, SpO2 100 %.  Medical Problem List and Plan: 1. Functional deficits secondary to debility related to VF cardiac arrest secondary to massive pulmonary emboli status post mechanical thrombectomy             con't CIR_ PT , OT and SLP 2.  Antithrombotics: -DVT/anticoagulation:  Pharmaceutical: Other (comment) Eliquis- has to use due to saddle PE 2/23- stopped due to plts by Heme/Onc             -antiplatelet therapy: N/A 3. Pain Management: Neurontin 100 mg twice daily, oxycodone as needed  Stopped gabapentin due ot sedation  2/27- using pain meds prn for RLE pain   Controlled with meds on 2/25 4. Mood anxiety-  Cymbalta 20 mg daily, Xanax 0.25 mg daily as needed provide emotional support  2/21- change to Paxil 20 mg nightly- 2/24- no side effects noted per pt- flat, but looks less anxious              -antipsychotic agents: N/A 5. Neuropsych: This  patient is not capable of making decisions on her own behalf. 6. Skin/Wound Care: Routine skin checks 7. Fluids/Electrolytes/Nutrition: still not eating much  -continue TF for now.   -hopefully po intake will pick up as mentation improves.   2/20- family wants PEG_ pt kept saying doesn't 2/21- family still wants PEG when possible  2/23- wil do once bacteremia addressed 8.  Pericasular hepatic /hemorrhagic shock/acute blood loss anemia/status post left liver  lobe embolization  Hgb down again to 7.7 repeat non contrast CT abd no change in pericapsular hepatic bleed , check stool OB  2/24- Hb 8.0- will con't to monitor 9.  AKI secondary to ATN.  Follow-up per renal services.  Started clip for AKI- not ESRD, however Cr almost 5 today- getting HD tomorrow 2/8.   2/9- last Cr 5.74- up from 4.97 up from 4.45- rising- per renal- likely HD tomorrow?.  2/24- HD catheter removed due to bacteremia- renal following for possible more HD Appreciate nephro recs 10.  Thrombocytopenia.  Suspect ITP.  Follow-up per hematology 2/21- Plts were 214- down to 17k- will transfuse and cal Heme/Onc- 2/22- plts up to 27k- this Am after transfusion- Heme/Onc not sure why drop in plts- however said no transfusion unless plts drop<10k  2.23- plts 33k this AM- doing better 2/24- Plts 46k- off Eliquis per Heme/Onc- had saddle PE- but risks/benefits require her ot be off Eliquis 2/25 platelets 57, continue to monitor  2/27- labs in AM to f/u 11.  Hyperlipidemia.  Lipitor 12. Diabetes- Poor appetite- has Cortrak for intake due to poor appetite-stop insulin- was on at home- levemir 4 units BID when came to rehab, but dropping CBGs- so will hold- will do CBGs BID Will assess if still needs- d/c insulin due to Tfs  2/11- TF's restarted so CBGs going up- 486 this AM- restarted Levemir 4 units BID and SSI- will titrate as required.    CBG (last 3)  Recent Labs    12/24/21 0001 12/24/21 0403 12/24/21 0801  GLUCAP 213* 154*  176*  Increased Levimir to 10U q12h  Changed to moderate SSI q 4h since oral intake is low   Levemir at 14 units BID  2/22- Cbgs still really elevated in 200s-300s- will add Novolog coverage per Dm coordinator 3 units at 8pm, midnight and 4am-   2/23- DM coordinator wants pt to increase coverage to 6 units with TF's.   2/24- CBGs labile, however finally have 83 this AM and 149 overnight- will con't regimen and titrate as required  2/27- CBGs medium to high 100s- low 200s- con't regimen for now 13. New onset confusion/poor memory- anoxia? From prolonged CPR  2/9- will check CT of head- and stop Gabapentin for nerve pain due to increased sedation- had before but so out of it per daughter- much worse last 2 days.   2/15- variable -tangential and  poor memory- will need to d/w daughter  2/21- still confused, but acutely medically ill still- is improving  2/24- poor memory due to prolonged CPR initially, however is doing better- 14. ABLA with chronic anemia  2/13 hb stable at 8.2  2/21- Hb stable at 8.0  2/25-hemoglobin 7.4, continue to monitor 15. Scab on back of head-pressure ulcer unstageable/Blister on sternum   2/24- scab unstageable on back of head and blister almost healed on L breast.  16. HTN with hx of VF arrest  2/10- pt's BP 178/95- running [id number]- systolic- will d/w renal since not on any BP meds-   2/11- Started norvasc and BP looks better 703 systolic- will con't to monitor trend. 2/24- BP controlled- con't regimen    Vitals:   12/23/21 2031 12/24/21 0313  BP: (!) 145/57 (!) 146/76  Pulse: 94 (!) 101  Resp:  18  Temp:  98.6 F (37 C)  SpO2:  100%   2/27- BP usually controlled- is likely up due ot anxiety this AM 17. Constipation  2/24- LBM last night- con't  to monitor 18. Leukocytosis-UTI/Bacteremia- Klebsiella and E Fecalis   2/15- will remove foley and bladder scan; also check U/A and Cx- foley was per renal .   2/17- pt has UTI- started on Cefepime yesterday- also  WBC up to 17k  2/18 WBC down to 13.2K , recheck in am   2/20- WBC up to 18.3k- due to HD catheter and no improvement- actually worse, d/w ID and pharmacy- agreed to change cefepime to rocephin- also start Vanc and check blood Cx's.   2/21- changed to Ampicillin with ID discussion- off Vanc due to E fecalis   2/22- WBC still 16.4- will check daily fo rnow  2/23- WBC 14.2- coming down  2/24- WBC 14.5- Neutrophils 70% so not a left shift- stable-   2/25-WBC 12.8, improving  2/27- labs in AM- cannot get TEE inpt due to thrombocytopenia per Cards- still on Ampicillin- .  19. Fall  2/16- pt on floor last night- denied pain- CT head was ordered - pt refused to keep on mask to go downstairs- will get this AM.  20. Poor appetite-  2/17- will get PEG placed next week so can remove Cortrak- spoke with PA to get ordered  2/20- order in, but cannot do when ill/sick/  2/21- wait on PEG- con't cortrak  2/22- pt eating entire banana this AM- which is new  2/27- con't Cortrak/TF's at night- 21. Anxiety  2/27- appears worse this AM- will ask nursing to give AM anxiety meds  I spent a total of 38    minutes on total care today- >50% coordination of care- due to prolonged time with pt due to anxiety and calling nurse about meds. Also d/w PT plan.     LOS: 20 days A FACE TO FACE EVALUATION WAS PERFORMED  Guynell Kleiber 12/24/2021, 8:29 AM

## 2021-12-24 NOTE — Progress Notes (Signed)
Spoke with Pt daughter about concerns with G tube placement that is scheduled Wednesday. Sent message to [employer] to discuss with pt family about procedure. Pt daughter wants to speak with Social worker in regards to a medical directive. Will send to message to assigned SW to discuss with family this afternoon.

## 2021-12-25 LAB — GLUCOSE, CAPILLARY
Glucose-Capillary: 132 mg/dL — ABNORMAL HIGH (ref 70–99)
Glucose-Capillary: 145 mg/dL — ABNORMAL HIGH (ref 70–99)
Glucose-Capillary: 176 mg/dL — ABNORMAL HIGH (ref 70–99)
Glucose-Capillary: 180 mg/dL — ABNORMAL HIGH (ref 70–99)
Glucose-Capillary: 198 mg/dL — ABNORMAL HIGH (ref 70–99)
Glucose-Capillary: 210 mg/dL — ABNORMAL HIGH (ref 70–99)

## 2021-12-25 LAB — CBC WITH DIFFERENTIAL/PLATELET
Abs Immature Granulocytes: 0.11 10*3/uL — ABNORMAL HIGH (ref 0.00–0.07)
Basophils Absolute: 0.1 10*3/uL (ref 0.0–0.1)
Basophils Relative: 1 %
Eosinophils Absolute: 0.4 10*3/uL (ref 0.0–0.5)
Eosinophils Relative: 3 %
HCT: 22.8 % — ABNORMAL LOW (ref 36.0–46.0)
Hemoglobin: 7.5 g/dL — ABNORMAL LOW (ref 12.0–15.0)
Immature Granulocytes: 1 %
Lymphocytes Relative: 16 %
Lymphs Abs: 2.1 10*3/uL (ref 0.7–4.0)
MCH: 28.6 pg (ref 26.0–34.0)
MCHC: 32.9 g/dL (ref 30.0–36.0)
MCV: 87 fL (ref 80.0–100.0)
Monocytes Absolute: 1.1 10*3/uL — ABNORMAL HIGH (ref 0.1–1.0)
Monocytes Relative: 8 %
Neutro Abs: 9.7 10*3/uL — ABNORMAL HIGH (ref 1.7–7.7)
Neutrophils Relative %: 71 %
Platelets: 99 10*3/uL — ABNORMAL LOW (ref 150–400)
RBC: 2.62 MIL/uL — ABNORMAL LOW (ref 3.87–5.11)
RDW: 15.4 % (ref 11.5–15.5)
WBC: 13.4 10*3/uL — ABNORMAL HIGH (ref 4.0–10.5)
nRBC: 0 % (ref 0.0–0.2)

## 2021-12-25 LAB — RENAL FUNCTION PANEL
Albumin: 2.6 g/dL — ABNORMAL LOW (ref 3.5–5.0)
Anion gap: 10 (ref 5–15)
BUN: 43 mg/dL — ABNORMAL HIGH (ref 8–23)
CO2: 22 mmol/L (ref 22–32)
Calcium: 8.9 mg/dL (ref 8.9–10.3)
Chloride: 109 mmol/L (ref 98–111)
Creatinine, Ser: 3.12 mg/dL — ABNORMAL HIGH (ref 0.44–1.00)
GFR, Estimated: 15 mL/min — ABNORMAL LOW (ref >60)
Glucose, Bld: 153 mg/dL — ABNORMAL HIGH (ref 70–99)
Phosphorus: 5 mg/dL — ABNORMAL HIGH (ref 2.5–4.6)
Potassium: 5.4 mmol/L — ABNORMAL HIGH (ref 3.5–5.1)
Sodium: 141 mmol/L (ref 135–145)

## 2021-12-25 LAB — LACTATE DEHYDROGENASE: LDH: 248 U/L — ABNORMAL HIGH (ref 98–192)

## 2021-12-25 MED ORDER — APIXABAN 2.5 MG PO TABS
2.5000 mg | ORAL_TABLET | Freq: Two times a day (BID) | ORAL | Status: DC
  Administered 2021-12-26 – 2021-12-28 (×5): 2.5 mg via ORAL
  Filled 2021-12-25 (×5): qty 1

## 2021-12-25 MED ORDER — OSMOLITE 1.5 CAL PO LIQD
715.0000 mL | ORAL | Status: DC
  Administered 2021-12-25 – 2021-12-30 (×6): 715 mL

## 2021-12-25 MED ORDER — SODIUM ZIRCONIUM CYCLOSILICATE 10 G PO PACK
10.0000 g | PACK | Freq: Every day | ORAL | Status: DC
  Administered 2021-12-25 – 2022-01-06 (×13): 10 g via ORAL
  Filled 2021-12-25 (×14): qty 1

## 2021-12-25 NOTE — Progress Notes (Signed)
Physical Therapy Note  Patient Details  Name: [name] MRN: [id number] Date of Birth: 10-12-1951 Today's Date: 12/25/2021   Attempted to see patient for scheduled therapy session. Pt received sidelying in bed, declines participation. No reason given other than she has participated during multiple therapy sessions already this date. Encouraged pt to at least sit up to EOB and importance of participation in therapy sessions for continued functional progress towards d/c from hospital. Pt continues to decline participation. Pt missed 45 min of scheduled therapy session due to refusal.    Excell Seltzer, PT, DPT, CSRS  12/25/2021, 4:51 PM

## 2021-12-25 NOTE — Progress Notes (Signed)
PROGRESS NOTE   Subjective/Complaints:  Pt sleeping- woke to verbal stimulus.  Pt reports had LBM last night-   Says still doesn't want PEG.   More awake and clear since stopped HD per [name].  Putting out large amount of urine.     ROS: limited by cognition/sleepiness   Objective:   No results found. Recent Labs    12/24/21 0822 12/25/21 0542  WBC 14.9* 13.4*  HGB 8.3* 7.5*  HCT 25.8* 22.8*  PLT 98* 99*   Recent Labs    12/24/21 0822 12/25/21 0542  NA 139 141  K 5.2* 5.4*  CL 104 109  CO2 24 22  GLUCOSE 160* 153*  BUN 41* 43*  CREATININE 3.12* 3.12*  CALCIUM 9.0 8.9    Intake/Output Summary (Last 24 hours) at 12/25/2021 4536 Last data filed at 12/24/2021 1827 Gross per 24 hour  Intake 297 ml  Output --  Net 297 ml     Pressure Injury 12/04/21 Head Posterior Unstageable - Full thickness tissue loss in which the base of the injury is covered by slough (yellow, tan, gray, green or brown) and/or eschar (tan, brown or black) in the wound bed. 3 cm round, scabbed area to po (Active)  12/04/21 2000  Location: Head  Location Orientation: Posterior  Staging: Unstageable - Full thickness tissue loss in which the base of the injury is covered by slough (yellow, tan, gray, green or brown) and/or eschar (tan, brown or black) in the wound bed.  Wound Description (Comments): 3 cm round, scabbed area to posterior head.  Present on Admission: Yes    Physical Exam: Vital Signs Blood pressure (!) 154/66, pulse (!) 101, temperature 98.3 F (36.8 C), temperature source Oral, resp. rate 18, height 5' 5"  (1.651 m), weight 66.2 kg, SpO2 100 %.   General: awake, alert, sitting EOB leaning on tray table- NT and PT in room;  NAD HENT: conjugate gaze; oropharynx moist CV: regular rhythm; tachycardic rate; no JVD Pulmonary: CTA B/L; no W/R/R- good air movement GI: soft, NT, ND, (+)BS Psychiatric: very anxious- voice  trembling Neurological: alert- trembling in hands severely Motor:  Bilateral lower extremities: LE 3/5 HF/KE R foot drop- at rest in PF Skin: unstageable wound with scabbing on back of head 3-4cm in diameter- also has popped/dry blister on L sternum- slough gone from base- -no change on head wound today- large scab/thick on back of head  Assessment/Plan: 1. Functional deficits which require 3+ hours per day of interdisciplinary therapy in a comprehensive inpatient rehab setting. Physiatrist is providing close team supervision and 24 hour management of active medical problems listed below. Physiatrist and rehab team continue to assess barriers to discharge/monitor patient progress toward functional and medical goals  Care Tool:  Bathing    Body parts bathed by patient: Right arm, Left arm, Chest, Abdomen   Body parts bathed by helper: Front perineal area, Buttocks, Right lower leg, Left lower leg     Bathing assist Assist Level: Supervision/Verbal cueing     Upper Body Dressing/Undressing Upper body dressing   What is the patient wearing?: Pull over shirt    Upper body assist Assist Level: Minimal Assistance - Patient >  75%    Lower Body Dressing/Undressing Lower body dressing      What is the patient wearing?: Pants, Underwear/pull up     Lower body assist Assist for lower body dressing: Maximal Assistance - Patient 25 - 49%     Toileting Toileting Toileting Activity did not occur (Clothing management and hygiene only): N/A (no void or bm)  Toileting assist Assist for toileting: Moderate Assistance - Patient 50 - 74%     Transfers Chair/bed transfer  Transfers assist     Chair/bed transfer assist level: Moderate Assistance - Patient 50 - 74%     Locomotion Ambulation   Ambulation assist   Ambulation activity did not occur: Safety/medical concerns  Assist level: 2 helpers Assistive device: Walker-rolling Max distance: 10'   Walk 10 feet  activity   Assist  Walk 10 feet activity did not occur: Safety/medical concerns  Assist level: 2 helpers Assistive device: Walker-rolling   Walk 50 feet activity   Assist Walk 50 feet with 2 turns activity did not occur: Safety/medical concerns         Walk 150 feet activity   Assist Walk 150 feet activity did not occur: Safety/medical concerns         Walk 10 feet on uneven surface  activity   Assist Walk 10 feet on uneven surfaces activity did not occur: Safety/medical concerns         Wheelchair     Assist Is the patient using a wheelchair?: Yes Type of Wheelchair: Manual    Wheelchair assist level: Supervision/Verbal cueing Max wheelchair distance: 82'    Wheelchair 50 feet with 2 turns activity    Assist        Assist Level: Supervision/Verbal cueing   Wheelchair 150 feet activity     Assist      Assist Level: Dependent - Patient 0%   Blood pressure (!) 154/66, pulse (!) 101, temperature 98.3 F (36.8 C), temperature source Oral, resp. rate 18, height 5' 5"  (1.651 m), weight 66.2 kg, SpO2 100 %.  Medical Problem List and Plan: 1. Functional deficits secondary to debility related to VF cardiac arrest secondary to massive pulmonary emboli status post mechanical thrombectomy             con't CIR_ PT , OT and SLP 2.  Antithrombotics: -DVT/anticoagulation:  Pharmaceutical: Other (comment) Eliquis- has to use due to saddle PE 2/23- stopped due to plts by Heme/Onc             -antiplatelet therapy: N/A 3. Pain Management: Neurontin 100 mg twice daily, oxycodone as needed  Stopped gabapentin due ot sedation  2/27- using pain meds prn for RLE pain   Controlled with meds on 2/25 4. Mood anxiety-  Cymbalta 20 mg daily, Xanax 0.25 mg daily as needed provide emotional support  2/21- change to Paxil 20 mg nightly- 2/24- no side effects noted per pt- flat, but looks less anxious              -antipsychotic agents: N/A 5. Neuropsych:  This patient is not capable of making decisions on her own behalf. 6. Skin/Wound Care: Routine skin checks 7. Fluids/Electrolytes/Nutrition: still not eating much  -continue TF for now.   -hopefully po intake will pick up as mentation improves.   2/20- family wants PEG_ pt kept saying doesn't 2/21- family still wants PEG when possible  2/23- wil do once bacteremia addressed 8.  Pericasular hepatic /hemorrhagic shock/acute blood loss anemia/status post left liver lobe embolization  Hgb down again to 7.7 repeat non contrast CT abd no change in pericapsular hepatic bleed , check stool OB  2/24- Hb 8.0- will con't to monitor  2/28- Hb down slightly to 7.5 9.  AKI secondary to ATN.  Follow-up per renal services.  Started clip for AKI- not ESRD, however Cr almost 5 today- getting HD tomorrow 2/8.   2/9- last Cr 5.74- up from 4.97 up from 4.45- rising- per renal- likely HD tomorrow?.  2/24- HD catheter removed due to bacteremia- renal following for possible more HD Appreciate nephro recs 10.  Thrombocytopenia.  Suspect ITP.  Follow-up per hematology 2/21- Plts were 214- down to 17k- will transfuse and cal Heme/Onc- 2/22- plts up to 27k- this Am after transfusion- Heme/Onc not sure why drop in plts- however said no transfusion unless plts drop<10k  2.23- plts 33k this AM- doing better 2/24- Plts 46k- off Eliquis per Heme/Onc- had saddle PE- but risks/benefits require her ot be off Eliquis 2/25 platelets 57, continue to monitor 2/28- plts up to 99k- Heme/Onc- said can start Eliquis- will start 2.5 mg BID- will start tomorrow 11.  Hyperlipidemia.  Lipitor 12. Diabetes- Poor appetite- has Cortrak for intake due to poor appetite-stop insulin- was on at home- levemir 4 units BID when came to rehab, but dropping CBGs- so will hold- will do CBGs BID Will assess if still needs- d/c insulin due to Tfs  2/11- TF's restarted so CBGs going up- 486 this AM- restarted Levemir 4 units BID and SSI- will titrate as  required.    CBG (last 3)  Recent Labs    12/24/21 2114 12/25/21 0020 12/25/21 0507  GLUCAP 163* 210* 132*  Increased Levimir to 10U q12h  Changed to moderate SSI q 4h since oral intake is low   Levemir at 14 units BID  2/22- Cbgs still really elevated in 200s-300s- will add Novolog coverage per Dm coordinator 3 units at 8pm, midnight and 4am-   2/23- DM coordinator wants pt to increase coverage to 6 units with TF's.   2/27- CBGs medium to high 100s- low 200s- con't regimen for now  2/28- CBGs 130s-210 overnight- still getting TF's overnight 13. New onset confusion/poor memory- anoxia? From prolonged CPR  2/9- will check CT of head- and stop Gabapentin for nerve pain due to increased sedation- had before but so out of it per daughter- much worse last 2 days.   2/15- variable -tangential and  poor memory- will need to d/w daughter  2/21- still confused, but acutely medically ill still- is improving  2/24- poor memory due to prolonged CPR initially, however is doing better- 14. ABLA with chronic anemia  2/13 hb stable at 8.2  2/21- Hb stable at 8.0  2/25-hemoglobin 7.4, continue to monitor 15. Scab on back of head-pressure ulcer unstageable/Blister on sternum   2/24- scab unstageable on back of head and blister almost healed on L breast.  16. HTN with hx of VF arrest  2/10- pt's BP 178/95- running 785Y/850Y- systolic- will d/w renal since not on any BP meds-   2/11- Started norvasc and BP looks better 774 systolic- will con't to monitor trend.  Vitals:   12/24/21 1938 12/25/21 0316  BP: (!) 144/70 (!) 154/66  Pulse: 94 (!) 101  Resp: 18 18  Temp: 98.1 F (36.7 C) 98.3 F (36.8 C)  SpO2: 95% 100%   2/28- BP overall controlled- con't regimen 17. Constipation  2/24- LBM last night- con't to monitor 18. Leukocytosis-UTI/Bacteremia- Klebsiella and  E Fecalis   2/15- will remove foley and bladder scan; also check U/A and Cx- foley was per renal .   2/17- pt has UTI- started on  Cefepime yesterday- also WBC up to 17k  2/18 WBC down to 13.2K , recheck in am   2/20- WBC up to 18.3k- due to HD catheter and no improvement- actually worse, d/w ID and pharmacy- agreed to change cefepime to rocephin- also start Vanc and check blood Cx's.   2/21- changed to Ampicillin with ID discussion- off Vanc due to E fecalis   2/22- WBC still 16.4- will check daily fo rnow  2/23- WBC 14.2- coming down  2/24- WBC 14.5- Neutrophils 70% so not a left shift- stable-   2/25-WBC 12.8, improving  2/27- labs in AM- cannot get TEE inpt due to thrombocytopenia per Cards- still on Ampicillin-  2/28- WBC 13.4- down from 14.9- con't regimen- will check ESR and CRP- will check another set of blood Cx's  19. Fall  2/16- pt on floor last night- denied pain- CT head was ordered - pt refused to keep on mask to go downstairs- will get this AM.  20. Poor appetite-  2/17- will get PEG placed next week so can remove Cortrak- spoke with PA to get ordered  2/20- order in, but cannot do when ill/sick/  2/21- wait on PEG- con't cortrak  2/22- pt eating entire banana this AM- which is new  2/27- con't Cortrak/TF's at night-  2/28- placed nutrition consult- to see if can not do PEG/ can stop TFs' ? Wait to remove Cortrak. Pt doesn't want PEG_ will d/ PEG order for now- and see if pt can eat more.  21. Anxiety  2/27- appears worse this AM- will ask nursing to give AM anxiety meds 22. Mass on liver?  2/28- needs outpt w/u with Heme/Onc.  23. Dispo  2/28- cannot get Medical POA so interim guardianship- [name] Lust 24. hyperkalemia  2/28- will start [facility]- put in by Nephrology   I spent a total of 53    minutes on total care today- >50% coordination of care- due to d/w daughter [name] at [employer] and team conference today as well - on phone for 25 minutes just with [name].      LOS: 21 days A FACE TO FACE EVALUATION WAS PERFORMED  Ellakate Gonsalves 12/25/2021, 9:58 AM

## 2021-12-25 NOTE — Progress Notes (Signed)
Speech Language Pathology Daily Session Note  Patient Details  Name: [name] MRN: [id number] Date of Birth: 1951-07-17  Today's Date: 12/25/2021 SLP Individual Time: 3419-3790 SLP Individual Time Calculation (min): 29 min  Short Term Goals: Week 4: SLP Short Term Goal 1 (Week 4): STG=LTG due to short ELOS  Skilled Therapeutic Interventions:Skilled ST services focused on cognitive skills. SLP facilitated sustained attention,basic-mildly complex problem solving and error awareness in novel PEG design task. Pt demonstrated sustained attention in 10 minute intervals, and required min A fading to supervision A verbal cues for problem solving and error awareness. Pt was left in room with call bell within reach and bed alarm set. SLP recommends to continue skilled services.     Pain Pain Assessment Pain Score: 0-No pain  Therapy/Group: Individual Therapy  [place]  [facility] 12/25/2021, 4:21 PM

## 2021-12-25 NOTE — Progress Notes (Signed)
Occupational Therapy Session Note  Patient Details  Name: [name] MRN: [id number] Date of Birth: 1951-07-16  Today's Date: 12/25/2021 OT Individual Time: 1005-1100 OT Individual Time Calculation (min): 55 min    Short Term Goals: Week 3:  OT Short Term Goal 1 (Week 3): pt will complete UB dressing with MIN A OT Short Term Goal 2 (Week 3): pt will recall correct hand placement for sit<>stand transfers for 3 consecutive OT sessions OT Short Term Goal 3 (Week 3): pt wil maintain sustained attention during 2 consecutive ADL sessions OT Short Term Goal 4 (Week 3): pt will complete dynamic balance tasks during ADLs with MIN A  Skilled Therapeutic Interventions/Progress Updates:  Pt greeted supine in bed  agreeable to OT intervention. [place], pt needs increased time and increased "buy in" to participate in therapy such as needing to pick out her clothes for the warm weather.Session focus on BADL reeducation, functional mobility, dynamic standing balance and decreasing overall caregiver burden.  Pt completed supine>sit with CGA, but needs increased time in between all transitions. Pt completed sit>stand from EOB with MIN A, MIN A to pivot to w/c with Rw. Transported pt to dresser  to allow pt to look through drawers to pick out her outfit, pt benefits from having a choice with ADL tasks. Pt completed UB dressing from w/c with MIN A, MAX A for LB dressing. Decreased safety awareness noted d/t planned failure as pt attempts to stand with regular socks on causing her to slide slightly. Once pt dressed pt wanting to look out window, RN enter and agreeable to allow pt to stay up in w/c near window with alarm belt activated and all needs within reach.                           Therapy Documentation Precautions:  Precautions Precautions: Fall, Other (comment) Precaution Comments: cortrak; right leg discomfort - PTA Restrictions Weight Bearing Restrictions: No   Pain: no pain reported during  session     Therapy/Group: Individual Therapy  Corinne Ports [facility] 12/25/2021, 12:07 PM

## 2021-12-25 NOTE — Progress Notes (Signed)
Speech Language Pathology Weekly Progress and Session Note  Patient Details  Name: [name] MRN: [id number] Date of Birth: [birth year]  Beginning of progress report period: December 19, 2021 End of progress report period: December 25, 2021  Today's Date: 12/25/2021 SLP Individual Time: 0805-0902 SLP Individual Time Calculation (min): 57 min  Short Term Goals: Week 3: SLP Short Term Goal 1 (Week 3): Pt will demonstrated sustained attention in 5-7 minute intervals with min A verbal cues. SLP Short Term Goal 1 - Progress (Week 3): Met SLP Short Term Goal 2 (Week 3): Pt will demonstrate basic problem solving skills with min A verbal cues. SLP Short Term Goal 2 - Progress (Week 3): Not met SLP Short Term Goal 3 (Week 3): Pt will demonstrate ability to self-monitor and recognize errors in functional tasks with mod A verbal cues. SLP Short Term Goal 3 - Progress (Week 3): Not met SLP Short Term Goal 4 (Week 3): Pt will demonstrated orientation x4 with min A verbal cues for visual aids. SLP Short Term Goal 4 - Progress (Week 3): Met SLP Short Term Goal 5 (Week 3): Pt tolerate dys 3 textures and thin liquids with minimal overt s/s aspiration and supervision A verbal cues for safe swallow strategies. SLP Short Term Goal 5 - Progress (Week 3): Met    New Short Term Goals: Week 4: SLP Short Term Goal 1 (Week 4): STG=LTG due to short ELOS  Weekly Progress Updates: Pt made moderate progress meeting 3 out of 5 goals this reporting period. Pt demonstrated improvement in attention and mastication when consuming regular textures trials, resulting in diet upgrade to regular textures and thin liquids. Pt's barriers at discharge continue to be limited PO intake (despite least restrictive diet), reduced participation (due to pain, fatigue and anxiety), reduced sustained attention, problem solving and error awareness. Pt has made overall gains in orientation, sustained attention 5 minute intervals,  functional basic problem solving and error awareness when participatory. Education is on going with family. Pt would continue to benefit from skilled ST services to maximize functional independence and reduce burden of care requiring 24 hour supervision and continued ST services.     Intensity: Minumum of 1-2 x/day, 30 to 90 minutes Frequency: 3 to 5 out of 7 days Duration/Length of Stay: TBD due to possible SNF placement Treatment/Interventions: Cognitive remediation/compensation;Dysphagia/aspiration precaution training;Internal/external aids;Speech/Language facilitation;Cueing hierarchy;Environmental controls;Therapeutic Activities;Functional tasks;Patient/family education   Daily Session  Skilled Therapeutic Interventions:  Skilled ST services focused on swallow and cognitive skills. NT and SLP repositioned pt in bed for PO intake of regular textures and thin liquids breakfast tray. Pt required continued encouragement to engage in PO consumption. Pt expressed on-going foot medication and requested medication with min A verbal cues to press call bell. Nurse arrived and then pt requested to use the bathroom. SLP assisted in transfer via stedy to the bathroom requiring min A. Pt demonstrated functional problem solving toileting and washing hands with supervision A verbal cues. Pt sat on the side of the bed and began PO intake of oatmeal as well as consuming medication, with continued encouragement requiring breaks. Pt supported" I just woke up. Give me time." SLP then accidentally pressed the foot lowering button on the bed, in which startled the patient and she requested to "I need to clam my nerves." SLP allowed pt to rest and used a time task cue. Pt took the rest of her medication whole with thin liquids and continued consuming oatmeal with diced peaches. Pt demonstrated prolonged  mastication, but appropriate. SLP left pt on the side of the bed and notified nurse. Pt was left in room with call bell  within reach and bed alarm set. SLP recommends to continue skilled services.    General    Pain Pain Assessment Faces Pain Scale: Hurts even more Pain Type: Acute pain Pain Location: Foot Pain Onset: On-going Pain Intervention(s): RN made aware;Rest;Relaxation  Therapy/Group: Individual Therapy  Knolan Simien  [facility] 12/25/2021, 11:19 AM

## 2021-12-25 NOTE — Patient Care Conference (Signed)
Inpatient RehabilitationTeam Conference and Plan of Care Update Date: 12/25/2021   Time: 11:11 AM    Patient Name: [name]      Medical Record Number: [id number]  Date of Birth: 08/12/1951 Sex: Female         Room/Bed: 4W04C/4W04C-01 Payor Info: Payor: [employer] MEDICARE / Plan: [employer] MEDICARE CHOICE PPO / Product Type: *No Product type* /    Admit Date/Time:  12/04/2021  6:47 PM  Primary Diagnosis:  [facility] Problems: Principal Problem:   Debility Active Problems:   Cardiac arrest ([place])   Acute saddle pulmonary embolism with acute cor pulmonale (HCC)   Acute on chronic renal failure ([place])   Anoxia of brain ([place])   Essential hypertension    Expected Discharge Date: Expected Discharge Date:  (SNF)  Team Members Present: Physician leading conference: Dr. Courtney Heys Social Worker Present: [name], [place] Nurse Present: Dorthula Nettles, RN PT Present: Excell Seltzer, PT OT Present: Roanna Epley, [place], OT SLP Present: Charolett Bumpers, SLP PPS Coordinator present : Gunnar Fusi, SLP     Current Status/Progress Goal Weekly Team Focus  Bowel/Bladder   continent to bowel and bladder LBM 12/25/21  remain continent  toilet as needed   Swallow/Nutrition/ Hydration   reguar textures and thin liquids, prolonged mastication but functional, NG tube for support, continued limited PO intake  Supervision A  education, and increasing PO intake   ADL's   MIN A for UB ADLs, MAX A LB ADLs, MOD A toileting, transfers with rw with MOD A but assistance can fluctuate  mod A overall (downgraded 2/2 lack of progress)  activity tolerance, BADLs, transfers, education   Mobility   Supervision to mod A bed mobility, CGA to max A to stand to RW, transfers CGA to mod A via RW, gait up to 25 ft with RW min A  min A overall, short distance gait goal  endurance, balance, participation, gait   Communication   Min-supervision A  supervision A  education and thought  clarity   Safety/Cognition/ Behavioral Observations  Mod-min A  Min A  education, basic problem solving, imapct of deficits, recall and attention   Pain   pain to right ankle controlled with lidocaine patch  pain < 3  assess pain q 4hr and prn   Skin   eschar to posterior head, left chest wound closed  no new breakdown or infection  assess skin q shift and prn     Discharge Planning:  D/c to SNF pending until medically cleared. SW still waiting on preferred SNF location. Once medically cleared for placement, SNF pending insurance approval.   Team Discussion: MD updated with daughter [name], decided against PEG placement. Hgb continues to be up/down. Cr stable. To restart Eliquis tomorrow. Need negative blood cultures. IV antibiotics to continue for 6 weeks. MD spoke with nutrition concerning PO intake. Continent B/B, reports 10/10 pain, pressure injury posterior head, skin tears. When medically ready will look for placement.  Patient on target to meet rehab goals: Goals downgraded to mod assist. Attention and participation can fluctuate. Ambulated 20 ft with RW last week. Mod assist toileting. Now on regular textures to increase PO intake.  *See Care Plan and progress notes for long and short-term goals.   Revisions to Treatment Plan:  Adjusting medications, monitoring labs.   Teaching Needs: Family education, pain/anxiety management, skin/wound care, transfer/gait training, etc.   Current Barriers to Discharge: IV antibiotics and Behavior  Possible Resolutions to Barriers: Family education Encourage  participation     Medical Summary Current Status: Hb 7.5; Plts up to 99k;  WBC 13.4; Cr stable at 3.12; K+ 5.4;  continent B/B; pressure ulcer back of head;  Barriers to Discharge: Decreased family/caregiver support;Home enviroment access/layout;Hemodialysis;Medical stability;IV antibiotics;Wound care;Nutrition means  Barriers to Discharge Comments: held off PEG; [name] getting  guardianship per [name]; to go to SNF once medically more stable Possible Resolutions to [employer] Focus: CGA- mod A; mod-max A ADLs; goals downgraded; regular textures- limited PO intake; limited partiicpation- rating pain 10/10 sometimes; anxiety also very limiting;  SNF?   Continued Need for Acute Rehabilitation Level of Care: The patient requires daily medical management by a physician with specialized training in physical medicine and rehabilitation for the following reasons: Direction of a multidisciplinary physical rehabilitation program to maximize functional independence : Yes Medical management of patient stability for increased activity during participation in an intensive rehabilitation regime.: Yes Analysis of laboratory values and/or radiology reports with any subsequent need for medication adjustment and/or medical intervention. : Yes   I attest that I was present, lead the team conference, and concur with the assessment and plan of the team.   Cristi Loron 12/25/2021, 4:44 PM

## 2021-12-25 NOTE — Progress Notes (Signed)
Nutrition Follow-up  DOCUMENTATION CODES:   Not applicable  INTERVENTION:  Continue Nepro Shake po TID, each supplement provides 425 kcal and 19 grams protein.   Decrease nocturnal TF via Cortrak NGT using Osmolite 1.5 cal formula to rate of 55 ml/h x 13 hours (6pm-7am)   Continue Pro-source TF 45 ml BID per tube.    Nocturnal tube feeds provides 1153 kcal, 67 gm protein, 543 ml free water. This meets 59% of estimated calories needs and 67% of minimum estimated protein needs.  NUTRITION DIAGNOSIS:   Increased nutrient needs related to chronic illness (renal failure on HD) as evidenced by estimated needs; ongoing  GOAL:   Patient will meet greater than or equal to 90% of their needs; progressing  MONITOR:   PO intake, Supplement acceptance, Labs, Weight trends, TF tolerance, Skin, I & O's  REASON FOR ASSESSMENT:   Consult Calorie Count  ASSESSMENT:   71 year old female with history of asthma, diabetes mellitus, fibromyalgia, hypertension, hyperlipidemia. Presents after loss of consciousness. Pt with functional deficits secondary to debility related to VF cardiac arrest secondary to massive pulmonary emboli status post mechanical thrombectomy. Pt with renal failure secondary to ATN and hemodialysis initiated and awaiting plan for long-term hemodialysis. Admitted to CIR. Cortrak placed 1/20. Tip of tube in stomach.  Meal completion has been 25-30%. Pt more alert and awake. Pt reports having a good appetite with no abdominal discomfort. Family has been bringing in food and snacks from outside. RN reports pt additionally consumes a meal each night that family brings in. Pt currently has Nepro shake ordered and has been consuming them. Pt reports she does not want a PEG. Pt hopeful to increase her PO intake for Cortrak to be removed. Discussed nutrition plans with MD. Plans to initiate calorie count. RD to follow up tomorrow for day 1 calorie count results. RD to decrease tube feeding  rate of nocturnal feeds to encourage and promote po intake during the day.   Labs and medications reviewed.  Diet Order:   Diet Order             Diet heart healthy/carb modified Room service appropriate? Yes; Fluid consistency: Thin  Diet effective now                   EDUCATION NEEDS:   Not appropriate for education at this time  Skin:  Skin Assessment: Reviewed RN Assessment Skin Integrity Issues:: Stage II, Other (Comment) Stage II: L sternum Other: MASD to buttocks, skin tear to buttock and R elbow  Last BM:  2/26  Height:   Ht Readings from Last 1 Encounters:  12/04/21 5\' 5"  (1.651 m)    Weight:   Wt Readings from Last 1 Encounters:  12/25/21 66.2 kg   BMI:  Body mass index is 24.29 kg/m.  Estimated Nutritional Needs:   Kcal:  1950-2150  Protein:  100-125 grams  Fluid:  1L + UOP  Corrin Parker, MS, RD, LDN RD pager number/after hours weekend pager number on Amion.

## 2021-12-25 NOTE — Progress Notes (Signed)
Occupational Therapy Session Note  Patient Details  Name: [name] MRN: [id number] Date of Birth: [birth year]  Today's Date: 12/25/2021 OT Individual Time: 1315-1330 OT Individual Time Calculation (min): 15 min  and Today's Date: 12/25/2021 OT Missed Time: 15 Minutes Missed Time Reason: Other (comment) (scheduling conflict)   Short Term Goals: Week 3:  OT Short Term Goal 1 (Week 3): pt will complete UB dressing with MIN A OT Short Term Goal 2 (Week 3): pt will recall correct hand placement for sit<>stand transfers for 3 consecutive OT sessions OT Short Term Goal 3 (Week 3): pt wil maintain sustained attention during 2 consecutive ADL sessions OT Short Term Goal 4 (Week 3): pt will complete dynamic balance tasks during ADLs with MIN A  Skilled Therapeutic Interventions/Progress Updates:    Pt resting in bed upon arrival. Pt greeted COTA cheerfully and appropriately. Attempted to engage pt in EOB activities but pt politely declined numerous times. Pt's conversation significantly tangential throughout session. Pt missed 15 mins skilled services 2/2 meeting scheduled during scheduled therapy. Will attempt to see pt as available.  Therapy Documentation Precautions:  Precautions Precautions: Fall, Other (comment) Precaution Comments: cortrak; right leg discomfort - PTA Restrictions Weight Bearing Restrictions: No General: General OT Amount of Missed Time: 15 Minutes Pain: Pt denies pain when at rest in bed   Therapy/Group: Individual Therapy  Leroy Libman 12/25/2021, 2:28 PM

## 2021-12-25 NOTE — Progress Notes (Signed)
Occupational Therapy Note  Patient Details  Name: [name] MRN: [id number] Date of Birth: Nov 21, 1950   Attempted to see pt to make up time missed earlier in day. Pt sleeping upon arrival and declined.    Leotis Shames [facility] 12/25/2021, 2:37 PM

## 2021-12-25 NOTE — Progress Notes (Signed)
Patient ID: [name], female   DOB: 06/18/1951, 71 y.o.   MRN: [id number]  SW met with pt in room to discuss current progress in rehab. She continues to report she does not want PEG if possible. She is aware efforts are being made to see how well she will do with meals.   [place] made contact with [name]  to provide updates from team conference. She confirms speaking with Dr. Dagoberto Ligas earlier. Reports she discussed with her mother if SNF short term rehab is appropriate based on progress makes in rehab since pt would like to d/c to home.   SW will continue to follow for d/c needs.   [name], MSW, [place] Office: [phone] Cell: [phone] Fax: [phone]

## 2021-12-25 NOTE — Progress Notes (Signed)
[facility] Progress Note   Subjective:  Sitting up eating lunch.  No complaints today.    Objective Vitals:   12/24/21 1251 12/24/21 1938 12/25/21 0315 12/25/21 0316  BP: (!) 154/79 (!) 144/70  (!) 154/66  Pulse: 89 94  (!) 101  Resp: 18 18  18   Temp: 98 F (36.7 C) 98.1 F (36.7 C)  98.3 F (36.8 C)  TempSrc: Oral Oral  Oral  SpO2: 100% 95%  100%  Weight:   66.2 kg   Height:       Physical Exam General: Well appearing woman, NAD. Room air Heart: RRR; no murmur Lungs: CTAB; no rales or wheezing Extremities: No LE edema Dialysis Access: TDC in R chest  Dialysis Orders: Establishing AKI orders. Has outpatient spot at [facility] TTS 2nd shift, will change schedule when getting closer to discharge.     Assessment/Plan: Severe AKI: HD dependent, felt d/t ischemic ATN in setting of cardiac arrest, shock, IV contrast. Underlying CKD with baseline Cr 1.3 in 2021. Dialysis started here 10/31/2021 w/ CRRT at the start. Getting iHD on MWF schedule here. Last HD was 2/22 and TDC removed 2/23 due to bacteremia. Creat leveling off at 3.1, appears to be recovering.  Will hold off on [facility] placement and follow labs. Started lokelma daily for mild hyperkalemia.      Fever/Enterococcus bacteremia: Blood Cx 2/20 +. Now on IV ampicillin. Repeat Blood Cx negative. ID following. [place] removed on 2/23.   Thrombocytopenia: Plts dropped 5K range, HIT was negative. Suspected ITP s/p Nplate and steroids. Seen by ONC. S/p BMBx without malignancy. Plts improved. Recently recurred w/ plts down to 17K. Seen by hematology, suspect due to acute infection. Plts continue to improve.   V-fib cardiac arrest: Outside of hospital d/t PE (on admit).  Massive "saddle" PE s/p mechanical thrombectomy 10/29/21: Vascular US without DVT. Did have partial hypercoaguable work-up with + lupus anticoagulant. On Eliqius.  Retroperitoneal bleed/hemorrhagic shock/ABLA: Originating from L liver lobe, s/p embolization 1/4,  also required FFP, plts, Vit K, PRBCs; now stable.  Acute hypoxic respiratory failure (resolved): D/t #2, extubated 11/07/2021.  T2DM/hyperglycemia: Per primary team.  Encephalopathy: Likely multifactorial, improved/resolved.  Anemia (CKD, prior ABLA): Hgb 8. S/p 21U PRBCs this admit. Heme previously had not recommended ESA d/t likely hypercoagulable state.    Secondary hyperparathyroidism: CorrCa, Phos ok without binders. PTH 129 11/22/21 - no VDRA for now.   Nutrition: Alb low - ongoing bolus TF's and protein supplements. Considering PEG.   Debility/weakness: Currently in CIR for therapy.   Madelon Lips, MD 12/21/2021, 12:24 PM     Additional Objective Labs: Basic Metabolic Panel: Recent Labs  Lab 12/19/21 0441 12/21/21 0617 12/23/21 0602 12/24/21 0822 12/25/21 0542  NA 135   < > 139 139 141  K 4.1   < > 4.7 5.2* 5.4*  CL 95*   < > 106 104 109  CO2 27   < > 22 24 22   GLUCOSE 226*   < > 123* 160* 153*  BUN 39*   < > 38* 41* 43*  CREATININE 2.64*   < > 3.10* 3.12* 3.12*  CALCIUM 9.2   < > 8.7* 9.0 8.9  PHOS 4.0  --   --  4.5 5.0*   < > = values in this interval not displayed.   Liver Function Tests: Recent Labs  Lab 12/19/21 0441 12/24/21 0822 12/25/21 0542  ALBUMIN 3.0* 2.9* 2.6*   CBC: Recent Labs  Lab 12/20/21 0650 12/21/21 0617  12/22/21 0550 12/24/21 0822 12/25/21 0542  WBC 14.2* 14.5* 12.8* 14.9* 13.4*  NEUTROABS 10.1* 10.2* 9.0* 10.9* 9.7*  HGB 7.6* 8.0* 7.4* 8.3* 7.5*  HCT 23.6* 24.2* 23.3* 25.8* 22.8*  MCV 87.1 86.7 88.3 89.3 87.0  PLT 33* 46* 57* 98* 99*   Blood Culture    Component Value Date/Time   SDES BLOOD LEFT ANTECUBITAL 12/19/2021 0447   SPECREQUEST  12/19/2021 0447    BOTTLES DRAWN AEROBIC AND ANAEROBIC Blood Culture adequate volume   CULT  12/19/2021 0447    NO GROWTH 5 DAYS Performed at [facility], [place] [address] [place], [place] [address]    REPTSTATUS 12/24/2021 FINAL 12/19/2021 0447   Studies/Results: No  results found. Medications:  sodium chloride Stopped (12/19/21 0145)   ampicillin (OMNIPEN) IV 2 g (12/25/21 0529)    ALPRAZolam  0.25 mg Oral Daily   amLODipine  10 mg Oral Q2000   [START ON 12/26/2021] apixaban  2.5 mg Oral BID   atorvastatin  20 mg Oral Daily   B-complex with vitamin C  1 tablet Oral Daily   chlorhexidine  15 mL Mouth Rinse BID   Chlorhexidine Gluconate Cloth  6 each Topical Q0600   dicyclomine  10 mg Oral TID AC   feeding supplement (NEPRO CARB STEADY)  237 mL Oral TID BM   feeding supplement (OSMOLITE 1.5 CAL)  975 mL Per Tube Q24H   feeding supplement (PROSource TF)  45 mL Per Tube BID   folic acid  2 mg Oral Daily   insulin aspart  0-15 Units Subcutaneous Q4H   insulin aspart  6 Units Subcutaneous TID WC   insulin detemir  14 Units Subcutaneous BID   lidocaine  2 patch Transdermal Q24H   megestrol  800 mg Oral Daily   mupirocin cream  1 application Topical BID   pantoprazole  40 mg Oral BID   PARoxetine  20 mg Oral QHS   senna  2 tablet Oral Daily   sodium zirconium cyclosilicate  10 g Oral Daily

## 2021-12-26 LAB — RENAL FUNCTION PANEL
Albumin: 2.7 g/dL — ABNORMAL LOW (ref 3.5–5.0)
Anion gap: 12 (ref 5–15)
BUN: 45 mg/dL — ABNORMAL HIGH (ref 8–23)
CO2: 22 mmol/L (ref 22–32)
Calcium: 9.2 mg/dL (ref 8.9–10.3)
Chloride: 107 mmol/L (ref 98–111)
Creatinine, Ser: 3.18 mg/dL — ABNORMAL HIGH (ref 0.44–1.00)
GFR, Estimated: 15 mL/min — ABNORMAL LOW (ref >60)
Glucose, Bld: 116 mg/dL — ABNORMAL HIGH (ref 70–99)
Phosphorus: 5.3 mg/dL — ABNORMAL HIGH (ref 2.5–4.6)
Potassium: 5.4 mmol/L — ABNORMAL HIGH (ref 3.5–5.1)
Sodium: 141 mmol/L (ref 135–145)

## 2021-12-26 LAB — GLUCOSE, CAPILLARY
Glucose-Capillary: 99 mg/dL (ref 70–99)
Glucose-Capillary: 159 mg/dL — ABNORMAL HIGH (ref 70–99)
Glucose-Capillary: 180 mg/dL — ABNORMAL HIGH (ref 70–99)
Glucose-Capillary: 127 mg/dL — ABNORMAL HIGH (ref 70–99)
Glucose-Capillary: 175 mg/dL — ABNORMAL HIGH (ref 70–99)
Glucose-Capillary: 175 mg/dL — ABNORMAL HIGH (ref 70–99)
Glucose-Capillary: 185 mg/dL — ABNORMAL HIGH (ref 70–99)

## 2021-12-26 LAB — C-REACTIVE PROTEIN: CRP: 0.8 mg/dL (ref <1.0)

## 2021-12-26 LAB — SEDIMENTATION RATE: Sed Rate: 79 mm/hr — ABNORMAL HIGH (ref 0–22)

## 2021-12-26 LAB — LACTATE DEHYDROGENASE: LDH: 248 U/L — ABNORMAL HIGH (ref 98–192)

## 2021-12-26 MED ORDER — PAROXETINE HCL 20 MG PO TABS
40.0000 mg | ORAL_TABLET | Freq: Every day | ORAL | Status: DC
  Administered 2021-12-26 – 2022-01-06 (×12): 40 mg via ORAL
  Filled 2021-12-26 (×13): qty 2

## 2021-12-26 NOTE — Progress Notes (Signed)
Patient had a witnessed fall, pt bed alarm sounded nurse entered room patient was actively sliding off the bed. Pt fell on to her buttocks, staff called for help, upon the arrival additional staff patient laid on her side. Patient denies pain, no injuries noted. On call MD notified no addition orders given.  ?

## 2021-12-26 NOTE — Progress Notes (Signed)
Occupational Therapy Session Note ? ?Patient Details  ?Name: [name] ?MRN: [id number] ?Date of Birth: Oct 28, 1951 ? ?Today's Date: 12/26/2021 ?OT Individual Time: 0814-4818 ?OT Individual Time Calculation (min): 47 min  ?13 mins missed d/t fatigue/refusal ? ?Short Term Goals: ?Week 3:  OT Short Term Goal 1 (Week 3): pt will complete UB dressing with MIN A ?OT Short Term Goal 2 (Week 3): pt will recall correct hand placement for sit<>stand transfers for 3 consecutive OT sessions ?OT Short Term Goal 3 (Week 3): pt wil maintain sustained attention during 2 consecutive ADL sessions ?OT Short Term Goal 4 (Week 3): pt will complete dynamic balance tasks during ADLs with MIN A ? ?Skilled Therapeutic Interventions/Progress Updates:  ?Pt greeted  supine in bed with Rn and NT present, pt hesitantly agreeable to OT intervention. Pt continues to be slow to mobilize and needs an incentive to participate in sessions. Encouraged pt to participate in session to mobilize OOB and eat breakfast. Pt able to sit EOB with MIN A to elevate trunk to eat breakfast with supervision but then lays herself back down and states "its too early." Pt denies pain or fatigue just wanting to lay down. Pt oriented to new month but continues to make comments like "are we the only ones in the house?" Once pt supine, pt no longer willing to participate d/t fatigue. Pt left supine in bed with bed alarm activated and all needs within reach.      ? ? ?Therapy Documentation ?Precautions:  ?Precautions ?Precautions: Fall, Other (comment) ?Precaution Comments: cortrak; right leg discomfort - PTA ?Restrictions ?Weight Bearing Restrictions: No ? ?Pain: no pain reported during session  ? ? ? ?Therapy/Group: Individual Therapy ? ?[name] ?12/26/2021, 10:07 AM ?

## 2021-12-26 NOTE — Progress Notes (Signed)
Physical Therapy Session Note ? ?Patient Details  ?Name: [name] ?MRN: [id number] ?Date of Birth: [birth year] ? ?Today's Date: 12/26/2021 ?PT Individual Time:  -  ?   ? ?Short Term Goals: ?Week 3:  PT Short Term Goal 1 (Week 3): =LTG due to ELOS ?PT Short Term Goal 1 - Progress (Week 3): Progressing toward goal ? ?Skilled Therapeutic Interventions/Progress Updates: Pt presented in recliner hand off from SLP. Pt states very fatigued and requesting to return to bed. Pt somewhat impulsive and immediately trying to stand up but pt has standard socks on and recliner was not in place that would allow appropriate transfer to bed. Pt donned shoes with set up (slip ons) and recliner adjusted. Pt was able to perform Sit to stand from recliner with minA and performed step transfer to bed with HHA and modA. Pt was able to transfer to sidelying with supervision and adjusted in bed safety mod I. PTA discussed with pt current barriers to participate in therapy with pt stating continuing to work on anxiety and PTA advised will currently take pt off MWF HD schedule as Nephrology monitoring on daily basis and levels appear to be stabilizing per notes. Pt appreciative and change. Pt left in bed at end of session with bed alarm on, call bell within reach and needs met.  ?   ? ?Therapy Documentation ?Precautions:  ?Precautions ?Precautions: Fall, Other (comment) ?Precaution Comments: cortrak; right leg discomfort - PTA ?Restrictions ?Weight Bearing Restrictions: No ?General: ?  ?Vital Signs: ?Therapy Vitals ?Temp: 97.6 ?F (36.4 ?C) ?Temp Source: Oral ?Pulse Rate: 91 ?Resp: 16 ?BP: 134/64 ?Patient Position (if appropriate): Lying ?Oxygen Therapy ?SpO2: 100 % ?O2 Device: Room Air ?Pain: ?  ?Mobility: ?  ?Locomotion : ?   ?Trunk/Postural Assessment : ?   ?Balance: ?  ?Exercises: ?  ?Other Treatments:   ? ? ? ?Therapy/Group: Individual Therapy ? ?[name] ?12/26/2021, 4:23 PM  ?

## 2021-12-26 NOTE — Significant Event (Signed)
°   12/25/21 2146  What Happened  Was fall witnessed? Yes  Who witnessed fall? Romeo Apple, RN  Patients activity before fall to/from bed, chair, or stretcher  Point of contact buttocks  Was patient injured? No  Patient found on floor  Found by Staff-comment Romeo Apple, Rn)  Stated prior activity to/from bed, chair, or stretcher  Follow Up  MD notified Katharine Look, [place]  Time MD notified 2150  Family notified Yes - comment (Daughter-[name])  Time family notified 2158  Additional tests No  Adult Fall Risk Assessment  Risk Factor Category (scoring not indicated) Fall has occurred during this admission (document High fall risk)  Age 71  Fall History: Fall within 6 months prior to admission 5  Elimination; Bowel and/or Urine Incontinence 0  Elimination; Bowel and/or Urine Urgency/Frequency 0  Medications: includes PCA/Opiates, Anti-convulsants, Anti-hypertensives, Diuretics, Hypnotics, Laxatives, Sedatives, and Psychotropics 5  Patient Care Equipment 2  Mobility-Assistance 2  Mobility-Gait 2  Mobility-Sensory Deficit 2  Altered awareness of immediate physical environment 1  Impulsiveness 2  Lack of understanding of one's physical/cognitive limitations 4  Total Score 27  Patient Fall Risk Level High fall risk  Adult Fall Risk Interventions  Required Bundle Interventions *See Row Information* High fall risk - low, moderate, and high requirements implemented  Additional Interventions Use of appropriate toileting equipment (bedpan, BSC, etc.)  Screening for Fall Injury Risk (To be completed on HIGH fall risk patients) - Assessing Need for Floor Mats  Risk For Fall Injury- Criteria for Floor Mats Previous fall this admission  Will Implement Floor Mats Yes  Vitals  Temp 98.1 F (36.7 C)  BP 110/74  MAP (mmHg) 85  BP Location Right Wrist  BP Method Automatic  Patient Position (if appropriate) Lying  Pulse Rate (!) 103  Pulse Rate Source Monitor  Resp 18  Oxygen Therapy  SpO2 100 %  O2 Device  Room Air  Pain Assessment  Pain Scale 0-10  Pain Score 0  PCA/Epidural/Spinal Assessment  Respiratory Pattern Regular;Unlabored  Neurological  Neuro (WDL) X  Level of Consciousness Alert  Orientation Level Oriented to person;Oriented to place;Oriented to time;Oriented to situation  Cognition Follows commands  Speech Clear  R Hand Grip Moderate  L Hand Grip Moderate  R Foot Dorsiflexion Moderate  L Foot Dorsiflexion Strong  RUE Motor Response Purposeful movement  Glasgow Coma Scale  Eye Opening 4  Best Verbal Response (NON-intubated) 4  Modified Verbal Response (INTUBATED) 5  Best Motor Response 6  Glasgow Coma Scale Score (!) 19  Musculoskeletal  Musculoskeletal (WDL) X  Assistive Device Stedy  Generalized Weakness Yes  Weight Bearing Restrictions No  Integumentary  Integumentary (WDL) X  Skin Color Appropriate for ethnicity  Skin Condition Dry  Skin Integrity Abrasion  Abrasion Location Groin  Abrasion Location Orientation Right

## 2021-12-26 NOTE — Progress Notes (Signed)
Physical Therapy Weekly Progress Note ? ?Patient Details  ?Name: [name] ?MRN: [id number] ?Date of Birth: June 12, 1951 ? ?Beginning of progress report period: December 19, 2021 ?End of progress report period: December 26, 2021 ? ?Today's Date: 12/26/2021 ?PT Individual Time: 4665-9935 ?PT Individual Time Calculation (min): 15 min  ?PT Amount of Missed Time (min): 30 Minutes ?PT Missed Treatment Reason: Patient unwilling to participate ? ?Patient has met 0 of 1 short term goals.  STG were set at LTG due to ELOS. Plan is still for patient to d/c to SNF, however awaiting pt to be medically stable prior to d/c. Pt continues to exhibit inconsistent performance in therapy sessions ranging from Supervision to mod A for bed mobility, CGA to max A for sit to stand and transfers with RW. Pt has been able to ambulate up to 23 ft with RW with min to mod A and is able to perform short distance w/c mobility at Supervision level with use of BUE. Pt remains limited by cognitive deficits including delayed processing, poor initiation, and possible motor planning difficulties. Pt exhibits inconsistent participation in therapy sessions and frequently refuses sessions with no reason given. ? ?Patient continues to demonstrate the following deficits muscle weakness, decreased cardiorespiratoy endurance, impaired timing and sequencing, decreased coordination, and decreased motor planning, decreased initiation, decreased attention, decreased awareness, decreased problem solving, decreased safety awareness, decreased memory, and delayed processing, and decreased sitting balance, decreased standing balance, decreased postural control, and decreased balance strategies and therefore will continue to benefit from skilled PT intervention to increase functional independence with mobility. ? ?Patient progressing toward long term goals..  Continue plan of care. ? ?PT Short Term Goals ?Week 3:  PT Short Term Goal 1 (Week 3): =LTG due to ELOS ?PT Short  Term Goal 1 - Progress (Week 3): Progressing toward goal ?Week 4:  PT Short Term Goal 1 (Week 4): =LTG due to ELOS ? ?Skilled Therapeutic Interventions/Progress Updates:  ?Pt received sidelying in bed asleep, arousable and agreeable with encouragement to participate in therapy session. Pt reports pain in her R ankle, not rated and declines intervention. Pt agreeable to get up to chair this AM. Sidelying to sitting EOB at Supervision level with increased time needed to complete. Assisted pt with donning shoes while seated EOB. Attempted to get pt to transfer to w/c but pt requests to transfer to recliner. Sit to stand with CGA to RW. Stand pivot transfer to recliner with RW and CGA. Pt then declines any further participation in session once seated in chair. Pt missed 30 min of scheduled therapy session due to refusal. Pt left seated in recliner in room with needs in reach, quick release belt and chair alarm in place. ? ?Therapy Documentation ?Precautions:  ?Precautions ?Precautions: Fall, Other (comment) ?Precaution Comments: cortrak; right leg discomfort - PTA ?Restrictions ?Weight Bearing Restrictions: No ? ? ? ? ? ?Therapy/Group: Individual Therapy ? ? ?Excell Seltzer, PT, DPT, CSRS ? ?12/26/2021, 9:59 AM  ?

## 2021-12-26 NOTE — Progress Notes (Signed)
Speech Language Pathology Daily Session Note ? ?Patient Details  ?Name: [name] ?MRN: [id number] ?Date of Birth: Oct 10, 1951 ? ?Today's Date: 12/26/2021 ?SLP Individual Time: 1015-1100 ?SLP Individual Time Calculation (min): 45 min ? ?Short Term Goals: ?Week 4: SLP Short Term Goal 1 (Week 4): STG=LTG due to short ELOS ? ?Skilled Therapeutic Interventions: Skilled treatment session focused on cognitive goals. Upon arrival, patient was asleep in the recliner and required extra time to rouse. SLP facilitated session by providing Mod-Max A verbal and visual cues for functional problem solving during a complex medication management task in which patient had to identify administration errors. Max verbal cues were also needed for sustained attention to task for ~2 minute intervals due to fatigue with patient requiring frequent rest breaks. Patient requested to use the bathroom and was transferred via the [facility]. Patient was continent of bladder and required Min verbal cues for problem solving throughout task. Patient handed off to PT. Continue with current plan of care.  ?   ? ?Pain ?No/Denies Pain  ? ?Therapy/Group: Individual Therapy ? ?[name] ?12/26/2021, 3:21 PM ?

## 2021-12-26 NOTE — Progress Notes (Signed)
RN inquired about CBGs. Will change to q AC and q HS now that she is on nocturnal tube feeds with HH diet.  ?

## 2021-12-26 NOTE — Progress Notes (Signed)
PROGRESS NOTE   Subjective/Complaints:  Pt reports she's "trying to deal with her anxiety"- has meds ordered this AM- hasn't received yet.  TF's still running this AM.  Per staff, pt fell in witnessed fall last night- no injuries noted and pt denies pain except normal R foot.   Pt had banana uneaten and an OJ that was finished at bedside  ROS: limited by cognition   Objective:   No results found. Recent Labs    12/24/21 0822 12/25/21 0542  WBC 14.9* 13.4*  HGB 8.3* 7.5*  HCT 25.8* 22.8*  PLT 98* 99*   Recent Labs    12/25/21 0542 12/26/21 0529  NA 141 141  K 5.4* 5.4*  CL 109 107  CO2 22 22  GLUCOSE 153* 116*  BUN 43* 45*  CREATININE 3.12* 3.18*  CALCIUM 8.9 9.2    Intake/Output Summary (Last 24 hours) at 12/26/2021 5427 Last data filed at 12/25/2021 1811 Gross per 24 hour  Intake 220 ml  Output --  Net 220 ml     Pressure Injury 12/04/21 Head Posterior Unstageable - Full thickness tissue loss in which the base of the injury is covered by slough (yellow, tan, gray, green or brown) and/or eschar (tan, brown or black) in the wound bed. 3 cm round, scabbed area to po (Active)  12/04/21 2000  Location: Head  Location Orientation: Posterior  Staging: Unstageable - Full thickness tissue loss in which the base of the injury is covered by slough (yellow, tan, gray, green or brown) and/or eschar (tan, brown or black) in the wound bed.  Wound Description (Comments): 3 cm round, scabbed area to posterior head.  Present on Admission: Yes    Physical Exam: Vital Signs Blood pressure (!) 102/52, pulse 96, temperature 98 F (36.7 C), resp. rate 18, height _0  (1.651 m), weight 67.3 kg, SpO2 100 %.    General: awake, alert, appropriate, laying supine in bed; NAD HENT: conjugate gaze; oropharynx dry; cortrak in place CV: regular rate; no JVD Pulmonary: CTA B/L; no W/R/R-decreased at bases B/L  GI: soft, NT, ND,  (+)BS Psychiatric: appropriate- but still anxious Neurological: more awake, alert today- no shaking noted today Motor:  Bilateral lower extremities: LE 3/5 HF/KE R foot drop- at rest in PF Skin: unstageable wound with scabbing on back of head 3-4cm in diameter- also has popped/dry blister on L sternum- slough gone from base- -no change on head wound today- large scab/thick on back of head  Assessment/Plan: 1. Functional deficits which require 3+ hours per day of interdisciplinary therapy in a comprehensive inpatient rehab setting. Physiatrist is providing close team supervision and 24 hour management of active medical problems listed below. Physiatrist and rehab team continue to assess barriers to discharge/monitor patient progress toward functional and medical goals  Care Tool:  Bathing    Body parts bathed by patient: Right arm, Left arm, Chest, Abdomen   Body parts bathed by helper: Front perineal area, Buttocks, Right lower leg, Left lower leg     Bathing assist Assist Level: Supervision/Verbal cueing     Upper Body Dressing/Undressing Upper body dressing   What is the patient wearing?: Pull over shirt  Upper body assist Assist Level: Minimal Assistance - Patient > 75%    Lower Body Dressing/Undressing Lower body dressing      What is the patient wearing?: Pants, Underwear/pull up     Lower body assist Assist for lower body dressing: Maximal Assistance - Patient 25 - 49%     Toileting Toileting Toileting Activity did not occur (Clothing management and hygiene only): N/A (no void or bm)  Toileting assist Assist for toileting: Moderate Assistance - Patient 50 - 74%     Transfers Chair/bed transfer  Transfers assist     Chair/bed transfer assist level: Moderate Assistance - Patient 50 - 74%     Locomotion Ambulation   Ambulation assist   Ambulation activity did not occur: Safety/medical concerns  Assist level: 2 helpers Assistive device:  Walker-rolling Max distance: 10'   Walk 10 feet activity   Assist  Walk 10 feet activity did not occur: Safety/medical concerns  Assist level: 2 helpers Assistive device: Walker-rolling   Walk 50 feet activity   Assist Walk 50 feet with 2 turns activity did not occur: Safety/medical concerns         Walk 150 feet activity   Assist Walk 150 feet activity did not occur: Safety/medical concerns         Walk 10 feet on uneven surface  activity   Assist Walk 10 feet on uneven surfaces activity did not occur: Safety/medical concerns         Wheelchair     Assist Is the patient using a wheelchair?: Yes Type of Wheelchair: Manual    Wheelchair assist level: Supervision/Verbal cueing Max wheelchair distance: 51'    Wheelchair 50 feet with 2 turns activity    Assist        Assist Level: Supervision/Verbal cueing   Wheelchair 150 feet activity     Assist      Assist Level: Dependent - Patient 0%   Blood pressure (!) 102/52, pulse 96, temperature 98 F (36.7 C), resp. rate 18, height _0  (1.651 m), weight 67.3 kg, SpO2 100 %.  Medical Problem List and Plan: 1. Functional deficits secondary to debility related to VF cardiac arrest secondary to massive pulmonary emboli status post mechanical thrombectomy            D/C SNF once medically appropriate Con't CIR- PT, OT and SLP- anxiety is biggest limiter 2.  Antithrombotics: -DVT/anticoagulation:  Pharmaceutical: Other (comment) Eliquis- has to use due to saddle PE 2/23- stopped due to plts by Heme/Onc  3/1- restarted Eliquis at 2.5 mg BID             -antiplatelet therapy: N/A 3. Pain Management: Neurontin 100 mg twice daily, oxycodone as needed  Stopped gabapentin due ot sedation  2/27- using pain meds prn for RLE pain   Controlled with meds on 2/25 4. Mood anxiety-  Cymbalta 20 mg daily, Xanax 0.25 mg daily as needed provide emotional support  2/21- change to Paxil 20 mg nightly- 2/24-  no side effects noted per pt- flat, but looks less anxious  3/1- will increase Paxil to 40 mg nightly for continued anxiety             -antipsychotic agents: N/A 5. Neuropsych: This patient is not capable of making decisions on her own behalf. 6. Skin/Wound Care: Routine skin checks 7. Fluids/Electrolytes/Nutrition: still not eating much  -continue TF for now.   -hopefully po intake will pick up as mentation improves.   2/20- family wants PEG_  pt kept saying doesn't 2/21- family still wants PEG when possible  2/23- wil do once bacteremia addressed 8.  Pericasular hepatic /hemorrhagic shock/acute blood loss anemia/status post left liver lobe embolization  Hgb down again to 7.7 repeat non contrast CT abd no change in pericapsular hepatic bleed , check stool OB  2/24- Hb 8.0- will con't to monitor  2/28- Hb down slightly to 7.5  3/1- will recheck in AM 9.  AKI secondary to ATN.  Follow-up per renal services.  Started clip for AKI- not ESRD, however Cr almost 5 today- getting HD tomorrow 2/8.   2/9- last Cr 5.74- up from 4.97 up from 4.45- rising- per renal- likely HD tomorrow?.  2/24- HD catheter removed due to bacteremia- renal following for possible more HD Appreciate nephro recs 10.  Thrombocytopenia.  Suspect ITP.  Follow-up per hematology 2/21- Plts were 214- down to 17k- will transfuse and cal Heme/Onc- 2/22- plts up to 27k- this Am after transfusion- Heme/Onc not sure why drop in plts- however said no transfusion unless plts drop<10k  2.23- plts 33k this AM- doing better 2/24- Plts 46k- off Eliquis per Heme/Onc- had saddle PE- but risks/benefits require her ot be off Eliquis 2/25 platelets 57, continue to monitor 2/28- plts up to 99k- Heme/Onc- said can start Eliquis- will start 2.5 mg BID- will start tomorrow 11.  Hyperlipidemia.  Lipitor 12. Diabetes- Poor appetite- has Cortrak for intake due to poor appetite-stop insulin- was on at home- levemir 4 units BID when came to rehab, but  dropping CBGs- so will hold- will do CBGs BID Will assess if still needs- d/c insulin due to Tfs  2/11- TF's restarted so CBGs going up- 486 this AM- restarted Levemir 4 units BID and SSI- will titrate as required.    CBG (last 3)  Recent Labs    12/25/21 2355 12/26/21 0418 12/26/21 0806  GLUCAP 176* 99 159*  Increased Levimir to 10U q12h  Changed to moderate SSI q 4h since oral intake is low   Levemir at 14 units BID  2/22- Cbgs still really elevated in 200s-300s- will add Novolog coverage per Dm coordinator 3 units at 8pm, midnight and 4am-   2/23- DM coordinator wants pt to increase coverage to 6 units with TF's.   2/27- CBGs medium to high 100s- low 200s- con't regimen for now  2/28- CBGs 130s-210 overnight- still getting TF's overnight  3/1- CBGs doing much better- con't regimen-  13. New onset confusion/poor memory- anoxia? From prolonged CPR  2/9- will check CT of head- and stop Gabapentin for nerve pain due to increased sedation- had before but so out of it per daughter- much worse last 2 days.   2/15- variable -tangential and  poor memory- will need to d/w daughter  2/21- still confused, but acutely medically ill still- is improving  2/24- poor memory due to prolonged CPR initially, however is doing better- 14. ABLA with chronic anemia  2/13 hb stable at 8.2  2/21- Hb stable at 8.0  2/25-hemoglobin 7.4, continue to monitor 15. Scab on back of head-pressure ulcer unstageable/Blister on sternum   2/24- scab unstageable on back of head and blister almost healed on L breast.  16. HTN with hx of VF arrest  2/10- pt's BP 178/95- running 308M/578I- systolic- will d/w renal since not on any BP meds-   2/11- Started norvasc and BP looks better 696 systolic- will con't to monitor trend.  Vitals:   12/25/21 2146 12/26/21 0420  BP: 110/74 Marland Kitchen)  102/52  Pulse: (!) 103 96  Resp: 18 18  Temp: 98.1 F (36.7 C) 98 F (36.7 C)  SpO2: 100% 100%   2/28- BP overall controlled- con't  regimen 17. Constipation  2/24- LBM last night- con't to monitor 18. Leukocytosis-UTI/Bacteremia- Klebsiella and E Fecalis   2/15- will remove foley and bladder scan; also check U/A and Cx- foley was per renal .   2/17- pt has UTI- started on Cefepime yesterday- also WBC up to 17k  2/18 WBC down to 13.2K , recheck in am   2/20- WBC up to 18.3k- due to HD catheter and no improvement- actually worse, d/w ID and pharmacy- agreed to change cefepime to rocephin- also start Vanc and check blood Cx's.   2/21- changed to Ampicillin with ID discussion- off Vanc due to E fecalis   2/22- WBC still 16.4- will check daily fo rnow  2/23- WBC 14.2- coming down  2/24- WBC 14.5- Neutrophils 70% so not a left shift- stable-   2/25-WBC 12.8, improving  2/27- labs in AM- cannot get TEE inpt due to thrombocytopenia per Cards- still on Ampicillin-  2/28- WBC 13.4- down from 14.9- con't regimen- will check ESR and CRP- will check another set of blood Cx's   3/1- ESR 79 and CRP 0.8 19. Fall  2/16- pt on floor last night- denied pain- CT head was ordered - pt refused to keep on mask to go downstairs- will get this AM.  3/1- fell last night- no injuries 20. Poor appetite-  2/17- will get PEG placed next week so can remove Cortrak- spoke with PA to get ordered  2/20- order in, but cannot do when ill/sick/  2/21- wait on PEG- con't cortrak  2/22- pt eating entire banana this AM- which is new  2/27- con't Cortrak/TF's at night-  2/28- placed nutrition consult- to see if can not do PEG/ can stop TFs' ? Wait to remove Cortrak. Pt doesn't want PEG_ will d/ PEG order for now- and see if pt can eat more.   3/1- calorie count in progress 21. Anxiety  2/27- appears worse this AM- will ask nursing to give AM anxiety meds  3/1- will increase Paxil to 40 mg nightly 22. Mass on liver?  2/28- needs outpt w/u with Heme/Onc.  23. Dispo  2/28- cannot get Medical POA so interim guardianship- [name] 24.  hyperkalemia  2/28- will start [place]- put in by Nephrology  3/1- K+ stilll 5.4- will stop bananas and OJ in tray- and con't [place]- recheck labs in AM    I spent a total of 38   minutes on total care today- >50% coordination of care- due to d/w nursing as well as PA about fall- and prolonged chart review and changes    LOS: 22 days A FACE TO FACE EVALUATION WAS PERFORMED  Dnya Hickle 12/26/2021, 8:17 AM

## 2021-12-26 NOTE — Progress Notes (Signed)
Calorie Count Note ? ?48 hour calorie count ordered. ? ?Diet: heart healthy/carbohydrate modified with thin liquids ?Supplements: Nepro Shake po TID, each supplement provides 425 kcal and 19 grams protein ? ?Breakfast: 108 kcal, 6 grams of protein ?Lunch: 205 kcal, 16 grams of protein ?Dinner: 10 kcal, 0 grams of protein ?Supplements: 850 kcal, 38 grams of protein ? ?Day 1 Total intake: ?1173 kcal (60% of kcal needs)  ?60 grams of protein (60% of protein needs) ? ?Estimated Nutritional Needs:  ?Kcal:  1950-2150 ?Protein:  100-125 grams ?Fluid:  1L + UOP ? ?Nutrition Dx:  ?Increased nutrient needs related to chronic illness (renal failure) as evidenced by estimated needs; ongoing ?  ?Goal:  ?Patient will meet greater than or equal to 90% of their needs; progressing ? ?Intervention:  ?RD to follow up tomorrow with day 2 calorie count results ? ?Continue Nepro Shake po TID, each supplement provides 425 kcal and 19 grams protein ? ?Continue nocturnal tube feeds via Cortrak NGT using Osmolite 1.5 cal formula at rate of 55 ml/h x 13 hours (6pm-7am) with 45 ml Prosource TF BID. ?Tube feeds provides 1153 kcal, 67 gm protein, 543 ml free water.  ? ?Corrin Parker, MS, RD, LDN ?RD pager number/after hours weekend pager number on Amion. ? ? ?

## 2021-12-26 NOTE — Progress Notes (Signed)
?[facility] ?Progress Note  ? ?Subjective:  sleeping, awakened to light touch, no issues.   ? ?Objective ?Vitals:  ? 12/25/21 2146 12/26/21 0420 12/26/21 0500 12/26/21 1246  ?BP: 110/74 (!) 102/52  134/64  ?Pulse: (!) 103 96  91  ?Resp: 18 18  16   ?Temp: 98.1 ?F (36.7 ?C) 98 ?F (36.7 ?C)  97.6 ?F (36.4 ?C)  ?TempSrc:    Oral  ?SpO2: 100% 100%  100%  ?Weight:   67.3 kg   ?Height:      ? ?Physical Exam ?General: Well appearing woman, NAD. Room air ?Heart: RRR; no murmur ?Lungs: CTAB; no rales or wheezing ?Extremities: No LE edema ?Dialysis Access: [facility] in R chest ? ?Dialysis Orders: ?Establishing AKI orders. Has outpatient spot at [facility] TTS 2nd shift, will change schedule when getting closer to discharge. ?  ? ? ?Assessment/Plan: ?Severe AKI: HD dependent, felt d/t ischemic ATN in setting of cardiac arrest, shock, IV contrast. Underlying CKD with baseline Cr 1.3 in 2021. Dialysis started here 10/31/2021 w/ CRRT at the start. Getting iHD on MWF schedule here. Last HD was 2/22 and TDC removed 2/23 due to bacteremia. Creat leveling off at 3.1, appears to be recovering.  Will hold off on [facility] placement and follow labs. Started lokelma daily for mild hyperkalemia.  Hesitant to put her on a renal diet because that severely restricts what she can eat- recommend that we stop bananas, orange juice, tomatoes.   Will need to consider 24 hr urine as OP.  Will sign off for now but please don't hesitate to call/ reconsult with questions.   ?  ?Fever/Enterococcus bacteremia: Blood Cx 2/20 +. Now on IV ampicillin. Repeat Blood Cx negative. ID following. TDC removed on 2/23.  ? ?Thrombocytopenia: Plts dropped 5K range, HIT was negative. Suspected ITP s/p Nplate and steroids. Seen by ONC. S/p BMBx without malignancy. Plts improved. Recently recurred w/ plts down to 17K. Seen by hematology, suspect due to acute infection. Plts continue to improve.  ? ?V-fib cardiac arrest: Outside of hospital d/t PE (on admit). ? ?Massive  "saddle" PE s/p mechanical thrombectomy 10/29/21: Vascular US without DVT. Did have partial hypercoaguable work-up with + lupus anticoagulant. On Eliqius. ? ?Retroperitoneal bleed/hemorrhagic shock/ABLA: Originating from L liver lobe, s/p embolization 1/4, also required FFP, plts, Vit K, PRBCs; now stable. ? ?Acute hypoxic respiratory failure (resolved): D/t #2, extubated 11/07/2021. ? ?T2DM/hyperglycemia: Per primary team. ? ?Encephalopathy: Likely multifactorial, improved/resolved. ? ?Anemia (CKD, prior ABLA): Hgb 8. S/p 21U PRBCs this admit. Heme previously had not recommended ESA d/t likely hypercoagulable state.  ?  ?Secondary hyperparathyroidism: CorrCa, Phos ok without binders. PTH 129 11/22/21 - no VDRA for now. ?  ?Nutrition: Alb low - ongoing bolus TF's and protein supplements. Considering PEG. ?  ?Debility/weakness: Currently in CIR for therapy. ? ? ?Madelon Lips, MD ?12/21/2021, 12:24 PM ? ? ? ? ?Additional Objective ?Labs: ?Basic Metabolic Panel: ?Recent Labs  ?Lab 12/24/21 ?0822 12/25/21 ?1791 12/26/21 ?5056  ?NA 139 141 141  ?K 5.2* 5.4* 5.4*  ?CL 104 109 107  ?CO2 24 22 22   ?GLUCOSE 160* 153* 116*  ?BUN 41* 43* 45*  ?CREATININE 3.12* 3.12* 3.18*  ?CALCIUM 9.0 8.9 9.2  ?PHOS 4.5 5.0* 5.3*  ? ?Liver Function Tests: ?Recent Labs  ?Lab 12/24/21 ?0822 12/25/21 ?9794 12/26/21 ?8016  ?ALBUMIN 2.9* 2.6* 2.7*  ? ?CBC: ?Recent Labs  ?Lab 12/20/21 ?0650 12/21/21 ?0617 12/22/21 ?5537 12/24/21 ?4827 12/25/21 ?0542  ?WBC 14.2* 14.5* 12.8* 14.9* 13.4*  ?  NEUTROABS 10.1* 10.2* 9.0* 10.9* 9.7*  ?HGB 7.6* 8.0* 7.4* 8.3* 7.5*  ?HCT 23.6* 24.2* 23.3* 25.8* 22.8*  ?MCV 87.1 86.7 88.3 89.3 87.0  ?PLT 33* 46* 57* 98* 99*  ? ?Blood Culture ?   ?Component Value Date/Time  ? SDES BLOOD RIGHT HAND 12/25/2021 1159  ? SPECREQUEST  12/25/2021 1159  ?  BOTTLES DRAWN AEROBIC AND ANAEROBIC Blood Culture adequate volume  ? CULT  12/25/2021 1159  ?  NO GROWTH < 24 HOURS ?Performed at [facility], [place] [address], [place],  [place] [address] ?  ? REPTSTATUS PENDING 12/25/2021 1159  ? ?Studies/Results: ?No results found. ?Medications: ? sodium chloride Stopped (12/19/21 0145)  ? ampicillin (OMNIPEN) IV 2 g (12/26/21 0558)  ? ? ALPRAZolam  0.25 mg Oral Daily  ? amLODipine  10 mg Oral Q2000  ? apixaban  2.5 mg Oral BID  ? atorvastatin  20 mg Oral Daily  ? B-complex with vitamin C  1 tablet Oral Daily  ? chlorhexidine  15 mL Mouth Rinse BID  ? Chlorhexidine Gluconate Cloth  6 each Topical Q0600  ? dicyclomine  10 mg Oral TID AC  ? feeding supplement (NEPRO CARB STEADY)  237 mL Oral TID BM  ? feeding supplement (OSMOLITE 1.5 CAL)  715 mL Per Tube Q24H  ? feeding supplement (PROSource TF)  45 mL Per Tube BID  ? folic acid  2 mg Oral Daily  ? insulin aspart  0-15 Units Subcutaneous Q4H  ? insulin aspart  6 Units Subcutaneous TID WC  ? insulin detemir  14 Units Subcutaneous BID  ? lidocaine  2 patch Transdermal Q24H  ? megestrol  800 mg Oral Daily  ? mupirocin cream  1 application Topical BID  ? pantoprazole  40 mg Oral BID  ? PARoxetine  40 mg Oral QHS  ? senna  2 tablet Oral Daily  ? sodium zirconium cyclosilicate  10 g Oral Daily  ? ? ? ? ? ? ? ? ? ?

## 2021-12-27 ENCOUNTER — Inpatient Hospital Stay: Payer: Self-pay

## 2021-12-27 LAB — CBC WITH DIFFERENTIAL/PLATELET
Abs Immature Granulocytes: 0.04 10*3/uL (ref 0.00–0.07)
Basophils Absolute: 0.1 10*3/uL (ref 0.0–0.1)
Basophils Relative: 1 %
Eosinophils Absolute: 0.3 10*3/uL (ref 0.0–0.5)
Eosinophils Relative: 3 %
HCT: 24.3 % — ABNORMAL LOW (ref 36.0–46.0)
Hemoglobin: 7.8 g/dL — ABNORMAL LOW (ref 12.0–15.0)
Immature Granulocytes: 0 %
Lymphocytes Relative: 18 %
Lymphs Abs: 2.2 10*3/uL (ref 0.7–4.0)
MCH: 28.4 pg (ref 26.0–34.0)
MCHC: 32.1 g/dL (ref 30.0–36.0)
MCV: 88.4 fL (ref 80.0–100.0)
Monocytes Absolute: 0.9 10*3/uL (ref 0.1–1.0)
Monocytes Relative: 7 %
Neutro Abs: 8.5 10*3/uL — ABNORMAL HIGH (ref 1.7–7.7)
Neutrophils Relative %: 71 %
Platelets: 129 10*3/uL — ABNORMAL LOW (ref 150–400)
RBC: 2.75 MIL/uL — ABNORMAL LOW (ref 3.87–5.11)
RDW: 15.4 % (ref 11.5–15.5)
WBC: 11.9 10*3/uL — ABNORMAL HIGH (ref 4.0–10.5)
nRBC: 0 % (ref 0.0–0.2)

## 2021-12-27 LAB — RENAL FUNCTION PANEL
Albumin: 2.8 g/dL — ABNORMAL LOW (ref 3.5–5.0)
Anion gap: 12 (ref 5–15)
BUN: 42 mg/dL — ABNORMAL HIGH (ref 8–23)
CO2: 23 mmol/L (ref 22–32)
Calcium: 9.4 mg/dL (ref 8.9–10.3)
Chloride: 106 mmol/L (ref 98–111)
Creatinine, Ser: 3.15 mg/dL — ABNORMAL HIGH (ref 0.44–1.00)
GFR, Estimated: 15 mL/min — ABNORMAL LOW (ref >60)
Glucose, Bld: 110 mg/dL — ABNORMAL HIGH (ref 70–99)
Phosphorus: 5.3 mg/dL — ABNORMAL HIGH (ref 2.5–4.6)
Potassium: 4.9 mmol/L (ref 3.5–5.1)
Sodium: 141 mmol/L (ref 135–145)

## 2021-12-27 LAB — GLUCOSE, CAPILLARY
Glucose-Capillary: 136 mg/dL — ABNORMAL HIGH (ref 70–99)
Glucose-Capillary: 133 mg/dL — ABNORMAL HIGH (ref 70–99)
Glucose-Capillary: 99 mg/dL (ref 70–99)
Glucose-Capillary: 82 mg/dL (ref 70–99)
Glucose-Capillary: 109 mg/dL — ABNORMAL HIGH (ref 70–99)
Glucose-Capillary: 206 mg/dL — ABNORMAL HIGH (ref 70–99)

## 2021-12-27 LAB — LACTATE DEHYDROGENASE: LDH: 239 U/L — ABNORMAL HIGH (ref 98–192)

## 2021-12-27 MED ORDER — SODIUM CHLORIDE 0.9% FLUSH
10.0000 mL | INTRAVENOUS | Status: DC | PRN
  Administered 2021-12-28: 10 mL

## 2021-12-27 MED ORDER — SODIUM CHLORIDE 0.9% FLUSH
10.0000 mL | Freq: Two times a day (BID) | INTRAVENOUS | Status: DC
  Administered 2021-12-28 – 2021-12-29 (×5): 10 mL
  Administered 2021-12-30: 20 mL
  Administered 2021-12-30 – 2022-01-06 (×10): 10 mL

## 2021-12-27 NOTE — Progress Notes (Signed)
Calorie Count Note ? ?48 hour calorie count ordered. ? ?Diet: heart healthy/carbohydrate modified with thin liquids ?Supplements: Nepro Shake po TID, each supplement provides 425 kcal and 19 grams protein ? ?Breakfast: 19 kcal, 1 grams of protein ?Lunch: 38 kcal, 2 grams of protein ?Dinner: 272 kcal, 12 grams of protein ?Supplements: 425 kcal, 19 grams of protein ? ?Day 2 Total intake: ?754 kcal (39% of kcal needs)  ?34 grams of protein (34% of protein needs) ? ?Estimated Nutritional Needs:  ?Kcal:  1950-2150 ?Protein:  100-125 grams ?Fluid:  1L + UOP ? ?Meal completion has only been 5-10% recently. RN reports pt with poor po intake and does not want to eat at meals. Pt currently has Nepro shake ordered, however RN reports pt would only drink some of the supplement at a time. Recommend continuation of enteral nutrition to provide adequate nutrition as pt not meeting nutrition goals orally.  ? ?Nutrition Dx:  ?Increased nutrient needs related to chronic illness (renal failure) as evidenced by estimated needs; ongoing ?  ?Goal:  ?Patient will meet greater than or equal to 90% of their needs; not met ? ?Intervention:  ? ?Continue Nepro Shake po TID, each supplement provides 425 kcal and 19 grams protein ? ?Continue nocturnal tube feeds via Cortrak NGT using Osmolite 1.5 cal formula at rate of 55 ml/h x 13 hours (6pm-7am) with 45 ml Prosource TF BID. ?Tube feeds provides 1153 kcal, 67 gm protein, 543 ml free water.  ? ?Corrin Parker, MS, RD, LDN ?RD pager number/after hours weekend pager number on Amion. ? ? ?

## 2021-12-27 NOTE — Progress Notes (Signed)
Occupational Therapy Session Note ? ?Patient Details  ?Name: [name] ?MRN: [id number] ?Date of Birth: [birth year] ? ?Today's Date: 12/27/2021 ?OT Individual Time: 1000-1053 ?OT Individual Time Calculation (min): 53 min  ? ? ?Short Term Goals: ?Week 4:  OT Short Term Goal 1 (Week 4): STG=LTG 2/2 ELOS ? ?Skilled Therapeutic Interventions/Progress Updates:  ?Skilled OT intervention completed with focus on self-care, activity participation, functional transfers. Pt received in sideyling in bed, with heavy incentives encouraged to get pt agreeable to session. Pt was consistently scattered, with changing tasks however focus of therapy with encouraging pt participation vs following strict therapy outline and plan. ? ?Pt completed bed mobility with supervision, then with RW completed sit > stand with min A and stand pivot transfer to w/c with mod A and max cues needed for positioning of RW with 2nd person present and assisting with pulling pt's hips towards seat as pt would not follow commands for safe transfer. Pt perseverative on looking into dresser drawers, with pt resistant to going to sink for originally discussed self-care. Pt gathered hair care items and preferred to complete hair care and integrity in the corner of the room. Pt maintained in the w/c for duration of session with therapist and pt conditioning and de-tangling her severely matted hair. Pt was able to maintain fluid conversation about her experiences in [facility]. Pt was able to style her hair with hair ties, with self-initiation and creativity in her hair style demonstrated. Pt transferred stand pivot with RW back to bed, however pt impulsively pushing RW away from her during transfer requiring Max A transfer to safely get pt to EOB. Pt expressing that the RW makes her nervous and shaky. Pt was left in sidelying, with multiple covers per comfort and therapist opening blinds for increased orientation and arousal. Pt left in bed with bed alarm on  and all needs in reach at end of session. ? ? ?Therapy Documentation ?Precautions:  ?Precautions ?Precautions: Fall, Other (comment) ?Precaution Comments: cortrak; right leg discomfort - PTA ?Restrictions ?Weight Bearing Restrictions: No ? ?Pain: ?No c/o pain ? ? ?Therapy/Group: Individual Therapy ? ?Devyn Griffing E Alvino Lechuga ?12/27/2021, 7:42 AM ?

## 2021-12-27 NOTE — Progress Notes (Addendum)
PROGRESS NOTE   Subjective/Complaints:  Pt reports stressed this AM- wants to talk to daughters today- reassured her trying to avoid PEG.   Per staff, room moved closer to desk due to fall 2 nights ago- hasn't fallen since.  Also slept better with trazodone at 7pm.   TGG:YIRSWNI by cognition/anxiety   Objective:   No results found. Recent Labs    12/25/21 0542 12/27/21 0518  WBC 13.4* 11.9*  HGB 7.5* 7.8*  HCT 22.8* 24.3*  PLT 99* 129*   Recent Labs    12/26/21 0529 12/27/21 0518  NA 141 141  K 5.4* 4.9  CL 107 106  CO2 22 23  GLUCOSE 116* 110*  BUN 45* 42*  CREATININE 3.18* 3.15*  CALCIUM 9.2 9.4    Intake/Output Summary (Last 24 hours) at 12/27/2021 6270 Last data filed at 12/26/2021 1300 Gross per 24 hour  Intake 118 ml  Output --  Net 118 ml     Pressure Injury 12/04/21 Head Posterior Unstageable - Full thickness tissue loss in which the base of the injury is covered by slough (yellow, tan, gray, green or brown) and/or eschar (tan, brown or black) in the wound bed. 3 cm round, scabbed area to po (Active)  12/04/21 2000  Location: Head  Location Orientation: Posterior  Staging: Unstageable - Full thickness tissue loss in which the base of the injury is covered by slough (yellow, tan, gray, green or brown) and/or eschar (tan, brown or black) in the wound bed.  Wound Description (Comments): 3 cm round, scabbed area to posterior head.  Present on Admission: Yes    Physical Exam: Vital Signs Blood pressure (!) 117/58, pulse (!) 103, temperature 99.3 F (37.4 C), temperature source Oral, resp. rate 18, height 5' 5"  (1.651 m), weight 67.7 kg, SpO2 99 %.     General: awake, alert, appropriate, laying curled up in bed; c/o being stressed;  NAD HENT: conjugate gaze; oropharynx moist- cortrak in place CV: regular rhythm; borderline tachycardic rate; no JVD Pulmonary: CTA B/L; no W/R/R- good air  movement GI: soft, NT, ND, (+)BS Psychiatric: appropriate; but c/o being anxious/stressed Neurological: alert- more alert  Motor:  Bilateral lower extremities: LE 3/5 HF/KE R foot drop- at rest in PF Skin: unstageable wound with scabbing on back of head 3-4cm in diameter- also has popped/dry blister on L sternum- slough gone from base- -no change on head wound today- large scab/thick on back of head  Assessment/Plan: 1. Functional deficits which require 3+ hours per day of interdisciplinary therapy in a comprehensive inpatient rehab setting. Physiatrist is providing close team supervision and 24 hour management of active medical problems listed below. Physiatrist and rehab team continue to assess barriers to discharge/monitor patient progress toward functional and medical goals  Care Tool:  Bathing    Body parts bathed by patient: Right arm, Left arm, Chest, Abdomen   Body parts bathed by helper: Front perineal area, Buttocks, Right lower leg, Left lower leg     Bathing assist Assist Level: Supervision/Verbal cueing     Upper Body Dressing/Undressing Upper body dressing   What is the patient wearing?: Pull over shirt    Upper body assist Assist  Level: Minimal Assistance - Patient > 75%    Lower Body Dressing/Undressing Lower body dressing      What is the patient wearing?: Pants, Underwear/pull up     Lower body assist Assist for lower body dressing: Maximal Assistance - Patient 25 - 49%     Toileting Toileting Toileting Activity did not occur (Clothing management and hygiene only): N/A (no void or bm)  Toileting assist Assist for toileting: Moderate Assistance - Patient 50 - 74%     Transfers Chair/bed transfer  Transfers assist     Chair/bed transfer assist level: Contact Guard/Touching assist     Locomotion Ambulation   Ambulation assist   Ambulation activity did not occur: Safety/medical concerns  Assist level: 2 helpers Assistive device:  Walker-rolling Max distance: 10'   Walk 10 feet activity   Assist  Walk 10 feet activity did not occur: Safety/medical concerns  Assist level: 2 helpers Assistive device: Walker-rolling   Walk 50 feet activity   Assist Walk 50 feet with 2 turns activity did not occur: Safety/medical concerns         Walk 150 feet activity   Assist Walk 150 feet activity did not occur: Safety/medical concerns         Walk 10 feet on uneven surface  activity   Assist Walk 10 feet on uneven surfaces activity did not occur: Safety/medical concerns         Wheelchair     Assist Is the patient using a wheelchair?: Yes Type of Wheelchair: Manual    Wheelchair assist level: Supervision/Verbal cueing Max wheelchair distance: 67'    Wheelchair 50 feet with 2 turns activity    Assist        Assist Level: Supervision/Verbal cueing   Wheelchair 150 feet activity     Assist      Assist Level: Dependent - Patient 0%   Blood pressure (!) 117/58, pulse (!) 103, temperature 99.3 F (37.4 C), temperature source Oral, resp. rate 18, height 5' 5"  (1.651 m), weight 67.7 kg, SpO2 99 %.  Medical Problem List and Plan: 1. Functional deficits secondary to debility related to VF cardiac arrest secondary to massive pulmonary emboli status post mechanical thrombectomy            D/C SNF once medically appropriate Continue CIR- PT, OT and SLP Anxiety is biggest limiter 2.  Antithrombotics: -DVT/anticoagulation:  Pharmaceutical: Other (comment) Eliquis- has to use due to saddle PE 2/23- stopped due to plts by Heme/Onc  3/1- restarted Eliquis at 2.5 mg BID             -antiplatelet therapy: N/A 3. Pain Management: Neurontin 100 mg twice daily, oxycodone as needed  Stopped gabapentin due ot sedation  3/2- pain controlled when takes pain meds 4. Mood anxiety-  Cymbalta 20 mg daily, Xanax 0.25 mg daily as needed provide emotional support  2/21- change to Paxil 20 mg  nightly- 2/24- no side effects noted per pt- flat, but looks less anxious  3/1- will increase Paxil to 40 mg nightly for continued anxiety  3/2- still anxious, but just increased Paxil             -antipsychotic agents: N/A 5. Neuropsych: This patient is not capable of making decisions on her own behalf. 6. Skin/Wound Care: Routine skin checks 7. Fluids/Electrolytes/Nutrition: still not eating much  -continue TF for now.   -hopefully po intake will pick up as mentation improves.   2/20- family wants PEG_ pt kept saying doesn't  2/21- family still wants PEG when possible  2/23- wil do once bacteremia addressed 8.  Pericasular hepatic /hemorrhagic shock/acute blood loss anemia/status post left liver lobe embolization  Hgb down again to 7.7 repeat non contrast CT abd no change in pericapsular hepatic bleed , check stool OB  2/24- Hb 8.0- will con't to monitor  2/28- Hb down slightly to 7.5  3/1- will recheck in AM  3/2- Hb up to 7.8 9.  AKI secondary to ATN.  Follow-up per renal services.  Started clip for AKI- not ESRD, however Cr almost 5 today- getting HD tomorrow 2/8.   2/9- last Cr 5.74- up from 4.97 up from 4.45- rising- per renal- likely HD tomorrow?.  2/24- HD catheter removed due to bacteremia- renal following for possible more HD Appreciate nephro recs 10.  Thrombocytopenia.  Suspect ITP.  Follow-up per hematology 2/21- Plts were 214- down to 17k- will transfuse and cal Heme/Onc- 2/22- plts up to 27k- this Am after transfusion- Heme/Onc not sure why drop in plts- however said no transfusion unless plts drop<10k  2.23- plts 33k this AM- doing better 2/24- Plts 46k- off Eliquis per Heme/Onc- had saddle PE- but risks/benefits require her ot be off Eliquis 2/25 platelets 57, continue to monitor 2/28- plts up to 99k- Heme/Onc- said can start Eliquis- will start 2.5 mg BID- will start tomorrow 3/2- plts up to 129k- doing much better 11.  Hyperlipidemia.  Lipitor 12. Diabetes- Poor  appetite- has Cortrak for intake due to poor appetite-stop insulin- was on at home- levemir 4 units BID when came to rehab, but dropping CBGs- so will hold- will do CBGs BID Will assess if still needs- d/c insulin due to Tfs  2/11- TF's restarted so CBGs going up- 486 this AM- restarted Levemir 4 units BID and SSI- will titrate as required.    CBG (last 3)  Recent Labs    12/27/21 0145 12/27/21 0442 12/27/21 0748  GLUCAP 136* 133* 99  Increased Levimir to 10U q12h  Changed to moderate SSI q 4h since oral intake is low   Levemir at 14 units BID  2/22- Cbgs still really elevated in 200s-300s- will add Novolog coverage per Dm coordinator 3 units at 8pm, midnight and 4am-   2/23- DM coordinator wants pt to increase coverage to 6 units with TF's.   2/27- CBGs medium to high 100s- low 200s- con't regimen for now  2/28- CBGs 130s-210 overnight- still getting TF's overnight  3/2- CBGs doing MUCH better-con't regimen- changed to qACHS   13. New onset confusion/poor memory- anoxia? From prolonged CPR  2/9- will check CT of head- and stop Gabapentin for nerve pain due to increased sedation- had before but so out of it per daughter- much worse last 2 days.   2/15- variable -tangential and  poor memory- will need to d/w daughter  2/21- still confused, but acutely medically ill still- is improving  2/24- poor memory due to prolonged CPR initially, however is doing better- 14. ABLA with chronic anemia  2/13 hb stable at 8.2  2/21- Hb stable at 8.0  2/25-hemoglobin 7.4, continue to monitor 15. Scab on back of head-pressure ulcer unstageable/Blister on sternum   2/24- scab unstageable on back of head and blister almost healed on L breast.  16. HTN with hx of VF arrest  2/10- pt's BP 178/95- running 947M/761H- systolic- will d/w renal since not on any BP meds-   2/11- Started norvasc and BP looks better 183 systolic- will con't to  monitor trend.  Vitals:   12/26/21 1926 12/27/21 0445  BP: 127/80 (!)  117/58  Pulse: 96 (!) 103  Resp: 16 18  Temp: 98 F (36.7 C) 99.3 F (37.4 C)  SpO2: 100% 99%   3/2- BP controlled- con't regimen 17. Constipation  2/24- LBM last night- con't to monitor 18. Leukocytosis-UTI/Bacteremia- Klebsiella and E Fecalis   2/15- will remove foley and bladder scan; also check U/A and Cx- foley was per renal .   2/17- pt has UTI- started on Cefepime yesterday- also WBC up to 17k  2/18 WBC down to 13.2K , recheck in am   2/20- WBC up to 18.3k- due to HD catheter and no improvement- actually worse, d/w ID and pharmacy- agreed to change cefepime to rocephin- also start Vanc and check blood Cx's.   2/21- changed to Ampicillin with ID discussion- off Vanc due to E fecalis   2/22- WBC still 16.4- will check daily fo rnow  2/23- WBC 14.2- coming down  2/24- WBC 14.5- Neutrophils 70% so not a left shift- stable-   2/25-WBC 12.8, improving  2/27- labs in AM- cannot get TEE inpt due to thrombocytopenia per Cards- still on Ampicillin-  2/28- WBC 13.4- down from 14.9- con't regimen- will check ESR and CRP- will check another set of blood Cx's   3/1- ESR 79 and CRP 0.8 3/2- WBC 11.9- doing better 19. Fall  2/16- pt on floor last night- denied pain- CT head was ordered - pt refused to keep on mask to go downstairs- will get this AM.  3/1- fell last night- no injuries  3/2- moved closer to desk 20. Poor appetite-  2/17- will get PEG placed next week so can remove Cortrak- spoke with PA to get ordered  2/20- order in, but cannot do when ill/sick/  2/21- wait on PEG- con't cortrak  2/22- pt eating entire banana this AM- which is new  2/27- con't Cortrak/TF's at night-  2/28- placed nutrition consult- to see if can not do PEG/ can stop TFs' ? Wait to remove Cortrak. Pt doesn't want PEG_ will d/ PEG order for now- and see if pt can eat more.   3/1- calorie count in progress 21. Anxiety  2/27- appears worse this AM- will ask nursing to give AM anxiety meds  3/1- will increase  Paxil to 40 mg nightly 22. Mass on liver?  2/28- needs outpt w/u with Heme/Onc.  23. Dispo  2/28- cannot get Medical POA so interim guardianship- [name] 24. hyperkalemia  2/28- will start [facility]- put in by Nephrology  3/1- K+ stilll 5.4- will stop bananas and OJ in tray- and con't Lokelma- recheck labs in AM  3/2- down to 4.9 this AM- will recheck in AM    I spent a total of 36   minutes on total care today- >50% coordination of care- due to d/with nursing about move and pt about trying to hold off on PEG.  Got approval from daughter [name] for PICC line- explaining will likely get dye to get PICC or midline, and could affect kidneys- since doesn't have IV access right now- will need for prolonged IV ABX.  Per ID note, will need IV ABX til 3/8- unless leaves, then can switch to renally dose Amoxicillin  Get 2 sets of blood C'x-s 1 week after end of ABX- ~ 3/15 for surveillance.   LOS: 23 days A FACE TO FACE EVALUATION WAS PERFORMED  Karis Rilling 12/27/2021, 8:06 AM

## 2021-12-27 NOTE — Progress Notes (Signed)
Occupational Therapy Weekly Progress Note ? ?Patient Details  ?Name: [name] ?MRN: [id number] ?Date of Birth: Mar 30, 1951 ? ?Beginning of progress report period: December 20, 2021 ?End of progress report period: December 27, 2021 ? ? ? ? ?Patient has met 1 of 4 short term goals.  Pt's progress and participation has been inconsistent over the past week. Pt continues to requires max encouragement and verbal cues to participate and recall BADL sequencing. Pt requires min A to max A overall for transfsers and BADLs. Pt frequently refuses to participate in therapies and has missed several minutes as a result. Currenlty awaiting SNF placement when medically stable.  ? ?Patient continues to demonstrate the following deficits: muscle weakness, decreased cardiorespiratoy endurance, impaired timing and sequencing, unbalanced muscle activation, decreased coordination, and decreased motor planning, decreased midline orientation and decreased motor planning, decreased initiation, decreased attention, decreased awareness, decreased problem solving, decreased safety awareness, decreased memory, and delayed processing, and decreased sitting balance, decreased standing balance, decreased postural control, and decreased balance strategies and therefore will continue to benefit from skilled OT intervention to enhance overall performance with BADL and Reduce care partner burden. ? ?Patient progressing toward long term goals..  Continue plan of care. ? ?OT Short Term Goals ?Week 3:  OT Short Term Goal 1 (Week 3): pt will complete UB dressing with MIN A ?OT Short Term Goal 1 - Progress (Week 3): Met ?OT Short Term Goal 2 (Week 3): pt will recall correct hand placement for sit<>stand transfers for 3 consecutive OT sessions ?OT Short Term Goal 2 - Progress (Week 3): Progressing toward goal ?OT Short Term Goal 3 (Week 3): pt wil maintain sustained attention during 2 consecutive ADL sessions ?OT Short Term Goal 3 - Progress (Week 3):  Progressing toward goal ?OT Short Term Goal 4 (Week 3): pt will complete dynamic balance tasks during ADLs with MIN A ?OT Short Term Goal 4 - Progress (Week 3): Progressing toward goal ?Week 4:  OT Short Term Goal 1 (Week 4): STG=LTG 2/2 ELOS ? ?   ? ? ?Leroy Libman ?12/27/2021, 6:26 AM  ?

## 2021-12-27 NOTE — Progress Notes (Signed)
Physical Therapy Session Note ? ?Patient Details  ?Name: [name] ?MRN: [id number] ?Date of Birth: 09/15/1951 ? ?Today's Date: 12/27/2021 ?PT Individual Time: [id number], [id number], and [id number] ?PT Individual Time Calculation (min): 35, 20 and 57 min  ? ?Short Term Goals: ?Week 4:  PT Short Term Goal 1 (Week 4): =LTG due to ELOS ? ?Skilled Therapeutic Interventions/Progress Updates: Tx1/2: Pt presented in bed agreeable to therapy. Pt requesting to eat breakfast. As PTA obtained tray IV team arrived to find new site. Pt becoming increasingly anxious therefore PTA stayed for emotional support while team attempted to obtain site. Caryl Pina, LPN arrived during IV team's second attempt therefore PTA left room to decrease distractions. Once completed pt requesting to attempt breakfast as nsg still needed to provide am meds. Pt performed supine to sit at EOB with CGA and increased time. Pt was able to eat small portions of food but once completed requested to return to supine as pt still anxious. Pt returned with supervision and use of bed features. Pt left in bed at end of session with bed alarm on, call bell within reach and 4th rail up.  ? ?Tx3: Pt presented in bed agreeable to therapy with extensive encouragement. PTA discussed with pt recent decreased participation in therapy and how a week ago pt was ambulating up to 30f. Pt unable to communicate why has been refusing therapy but pt expressed sense of frustration over not being able to go home. PTA reminded pt that in order to go home needs to improve mobility and participate as best as possible. Pt verbalized understanding and pt was agreeable to don clothes and attempt ambulation. Pt donned pants at bed level with minA and increased time. Pt performed supine to sit with CGA and performed Sit to stand from EOB with minA and heavy posterior lean. Pt required minA for pulling pants over hips. Once returned to sitting pt was able to don shirt with verbal cues for  sequencing. Pt then attempted ambulation ~58fwith RW and minA. After extended rest break pt agreeable to leave room at attempt ambulation in different setting. Pt transported to day room and ambulation an additional 10 ft with RW and CGA. Pt c/o increased R calf pain with PTA notifying nsg. Pt transported back to room and performed ambulatory transfer to bed with CGA. Pt required supervision to return to supine. Pt left in bed at end of session with bed alarm on, call bell within reach and needs met.  ?   ? ?Therapy Documentation ?Precautions:  ?Precautions ?Precautions: Fall, Other (comment) ?Precaution Comments: cortrak; right leg discomfort - PTA ?Restrictions ?Weight Bearing Restrictions: No ?General: ?PT Missed Treatment Reason: Nursing care (IV team) ?Vital Signs: ?Therapy Vitals ?Temp: (!) 97.3 ?F (36.3 ?C) ?Temp Source: Oral ?Pulse Rate: 92 ?Resp: 16 ?BP: (!) 150/75 ?Patient Position (if appropriate): Lying ?Oxygen Therapy ?SpO2: 100 % ?O2 Device: Room Air ?Pain: ?  ?Mobility: ?  ?Locomotion : ?   ?Trunk/Postural Assessment : ?   ?Balance: ?  ?Exercises: ?  ?Other Treatments:   ? ? ? ?Therapy/Group: Individual Therapy ? ?Monterio Bob ?12/27/2021, 4:32 PM  ?

## 2021-12-27 NOTE — Progress Notes (Signed)
Patient ID: [name], female   DOB: 1951-06-25, 71 y.o.   MRN: [id number] ? ?SW received updates from dialysis coord-Tracy Mounce reported Dr. Hollie Salk signed off on pt and to cancel referral for out-pt HD at d/c due to pt not currently requiring HD. The outpatient dialysis referral was cancelled this morning. ? ?SW updated medical team.  ? ?[name], MSW, LCSWA ?Office: [phone] ?Cell: [phone] ?Fax: [phone]  ?

## 2021-12-27 NOTE — Progress Notes (Signed)
Pt's case discussed with Dr Hollie Salk yesterday. Due to pt no longer requiring HD, Dr Hollie Salk feels it appropriate to cancel referral to [facility] for out-pt HD chair at d/c. Referral cancelled with [facility] this am. Navigator to sign off at this time.  ? ?Melven Sartorius ?Renal Navigator ?[phone] ?

## 2021-12-27 NOTE — Progress Notes (Signed)

## 2021-12-27 NOTE — Progress Notes (Signed)
Consulted to place PIV. Attempted 2x in the Left arm with primary RN at bedside. Limited venous access, recommended cvc. RN to notify MD. ?

## 2021-12-27 NOTE — Progress Notes (Signed)
Spoke with Dr Posey Pronto nephrologist, Ok to place Midline in the [place]. ?

## 2021-12-27 NOTE — Progress Notes (Signed)
IV team RN attempted x2 to place IV, will need central line. MD aware ?

## 2021-12-28 LAB — GLUCOSE, CAPILLARY
Glucose-Capillary: 124 mg/dL — ABNORMAL HIGH (ref 70–99)
Glucose-Capillary: 159 mg/dL — ABNORMAL HIGH (ref 70–99)
Glucose-Capillary: 211 mg/dL — ABNORMAL HIGH (ref 70–99)
Glucose-Capillary: 235 mg/dL — ABNORMAL HIGH (ref 70–99)
Glucose-Capillary: 287 mg/dL — ABNORMAL HIGH (ref 70–99)
Glucose-Capillary: 62 mg/dL — ABNORMAL LOW (ref 70–99)
Glucose-Capillary: 62 mg/dL — ABNORMAL LOW (ref 70–99)
Glucose-Capillary: 68 mg/dL — ABNORMAL LOW (ref 70–99)
Glucose-Capillary: 98 mg/dL (ref 70–99)

## 2021-12-28 LAB — LACTATE DEHYDROGENASE: LDH: 233 U/L — ABNORMAL HIGH (ref 98–192)

## 2021-12-28 MED ORDER — APIXABAN 5 MG PO TABS
5.0000 mg | ORAL_TABLET | Freq: Two times a day (BID) | ORAL | Status: DC
  Administered 2021-12-28 – 2022-01-07 (×20): 5 mg via ORAL
  Filled 2021-12-28 (×21): qty 1

## 2021-12-28 NOTE — Progress Notes (Signed)
Patient blood sugar 68 juice provided CBG then 62, Pam, PA notified. Instructed to start tube feed early. Will recheck in 30 mins ?

## 2021-12-28 NOTE — Progress Notes (Signed)
Physical Therapy Session Note ? ?Patient Details  ?Name: [name] ?MRN: [id number] ?Date of Birth: 20-Nov-1950 ? ?Today's Date: 12/28/2021 ?PT Individual Time: 1010-1106 ?PT Individual Time Calculation (min): 56 min  ? ?Short Term Goals: ?Week 3:  PT Short Term Goal 1 (Week 3): =LTG due to ELOS ?PT Short Term Goal 1 - Progress (Week 3): Progressing toward goal ? ?Skilled Therapeutic Interventions/Progress Updates: Pt presented in bed agreeable to therapy. Pt c/o mild RLE pain at rest increased during session with nsg notified at end of session due to increasing pain. Pt performed bed mobility with minA due to sequencing. Pt donned shoes with minA then requesting to use bathroom. Pt  performed ambulatory transfer to bathroom with CGA and increased time as well as was able to perform clothing management with minA to ensure that pants had cleared toilet (+ urinary void). Once completed pt stood from toilet with CGA with increased posterior lean noted and ambulated to w/c ~65f. Pt then performed grooming task of brushing hair then pt agreeable to leave room for activities. Pt transported to ortho gym and participated in car transfer with CGA and increased time. When PTA cued pt to push up using BUE from w/c decreased posterior lean noted. Pt seemed to have lightened mood and was able to scoot over to drive side and pretend to drive. Pt scooted back to passenger side and required minA to stand from car. Min A required moreso for sequencing vs physical assist. Pt then ambulated ~165fwith RW and CGA with w/c follow. Pt impulsively attempted to sit in standard chair vs w/c that was behind pt. PTA was able to reorient pt prior to pt sitting in standard chair. Pt then propelled out of ortho gym to MW nsg station ~5031for general endurance. Pt transported remaining distance and once in room requested nutrition drink (Nepro). Pt performed ambulatory transfer back to EOB and pt was able to drink 50% of Nepro with  supervision. Pt then performed sit to supine with supervision and repositioned to sidelying. Pt left in bed at end of session with bed alarm on, call bell within reach and needs met.  ?   ? ?Therapy Documentation ?Precautions:  ?Precautions ?Precautions: Fall, Other (comment) ?Precaution Comments: cortrak; right leg discomfort - PTA ?Restrictions ?Weight Bearing Restrictions: No ?General: ?  ?Vital Signs: ?Therapy Vitals ?Temp: 98.6 ?F (37 ?C) ?Temp Source: Oral ?Pulse Rate: 91 ?Resp: 16 ?BP: 115/68 ?Patient Position (if appropriate): Lying ?Oxygen Therapy ?SpO2: 100 % ?O2 Device: Room Air ?Pain: ?  ?Mobility: ?  ?Locomotion : ?   ?Trunk/Postural Assessment : ?   ?Balance: ?  ?Exercises: ?  ?Other Treatments:   ? ? ? ?Therapy/Group: Individual Therapy ? ?Bettejane Leavens ?12/28/2021, 4:04 PM  ?

## 2021-12-28 NOTE — Progress Notes (Signed)
Occupational Therapy Session Note ? ?Patient Details  ?Name: JANYLA BISCOE ?MRN: [id number] ?Date of Birth: [birth year] ? ?Today's Date: 12/28/2021 ?OT Individual Time: 9150-5697 ?OT Individual Time Calculation (min): 25 min  ? ? ?Short Term Goals: ?Week 4:  OT Short Term Goal 1 (Week 4): STG=LTG 2/2 ELOS ? ?Skilled Therapeutic Interventions/Progress Updates:  ?  Pt resting in bed upon arrival. Pt sat EOB with min encouragement. Supervision for sidelying>sit EOB. Pt donned socks seated EOB with supervision. Pt required mod verbal cues to recall events of today. Pt's conversation tangential. Sit<>stand from EOB with min A X 4. Pt returned to supine with supervision. Bed alarm activated. All needs within reach.  ? ?Therapy Documentation ?Precautions:  ?Precautions ?Precautions: Fall, Other (comment) ?Precaution Comments: cortrak; right leg discomfort - PTA ?Restrictions ?Weight Bearing Restrictions: No ?Pain: ?Pt denies pain this afternoon ? ?Therapy/Group: Individual Therapy ? ?Leroy Libman ?12/28/2021, 2:39 PM ?

## 2021-12-28 NOTE — Progress Notes (Signed)
Occupational Therapy Session Note ? ?Patient Details  ?Name: [name] ?MRN: [id number] ?Date of Birth: 08/25/1951 ? ?Today's Date: 12/28/2021 ?OT Individual Time: 1125-1150 ?OT Individual Time Calculation (min): 25 min  and Today's Date: 12/28/2021 ?OT Missed Time: 20 Minutes ?Missed Time Reason: Patient unwilling/refused to participate without medical reason;Patient fatigue ? ? ?Short Term Goals: ?Week 4:  OT Short Term Goal 1 (Week 4): STG=LTG 2/2 ELOS ? ?Skilled Therapeutic Interventions/Progress Updates:  ?  Pt received supine, initially fatigued and keeping her eyes closed, but when encouraged to keep her eyes open she was able to do so. She required mod cueing to come to EOB, with min A overall. Poor sequencing and motor planning. EOB she was assisted in applying lotion to her feet to maintain skin integrity with max A. Pt completes 2x10-15 dowel rod therex for BUE shoulder strengthening required for BADLs/functional transfers as follows with demo cuing and 3lb # dowel rod ?Shoulder flex/ext ?Elbow flex/ext ?Pt returned to supine in bed despite OT attempts to continue session. She declined any further intervention, stating she was too tired. Pt left supine with all needs met, bed alarm set.  ? ?Therapy Documentation ?Precautions:  ?Precautions ?Precautions: Fall, Other (comment) ?Precaution Comments: cortrak; right leg discomfort - PTA ?Restrictions ?Weight Bearing Restrictions: No ? ?Therapy/Group: Individual Therapy ? ? H  ?12/28/2021, 6:40 AM ?

## 2021-12-28 NOTE — Progress Notes (Signed)
PROGRESS NOTE   Subjective/Complaints:  Pt reports more anxious this AM- doesn't know why.  Got midline for IV ABX-  Per dietician eating 30-40% of total caloric needs daily and most of that with not finishing supplements-  Eating ~10% of meals.   Explained to pt the only way to get Cortrak out is for her to eat.   QQP:[id number] due to cognition/anxiety   Objective:   [name] EKG SITE RITE  Result Date: 12/27/2021 If Site Rite image not attached, placement could not be confirmed due to current cardiac rhythm.  Recent Labs    12/27/21 0518  WBC 11.9*  HGB 7.8*  HCT 24.3*  PLT 129*   Recent Labs    12/26/21 0529 12/27/21 0518  NA 141 141  K 5.4* 4.9  CL 107 106  CO2 22 23  GLUCOSE 116* 110*  BUN 45* 42*  CREATININE 3.18* 3.15*  CALCIUM 9.2 9.4    Intake/Output Summary (Last 24 hours) at 12/28/2021 0749 Last data filed at 12/27/2021 2000 Gross per 24 hour  Intake 260 ml  Output --  Net 260 ml     Pressure Injury 12/04/21 Head Posterior Unstageable - Full thickness tissue loss in which the base of the injury is covered by slough (yellow, tan, gray, green or brown) and/or eschar (tan, brown or black) in the wound bed. 3 cm round, scabbed area to po (Active)  12/04/21 2000  Location: Head  Location Orientation: Posterior  Staging: Unstageable - Full thickness tissue loss in which the base of the injury is covered by slough (yellow, tan, gray, green or brown) and/or eschar (tan, brown or black) in the wound bed.  Wound Description (Comments): 3 cm round, scabbed area to posterior head.  Present on Admission: Yes    Physical Exam: Vital Signs Blood pressure 122/72, pulse 100, temperature 99.5 F (37.5 C), resp. rate 20, height 5' 5"  (1.651 m), weight 67.7 kg, SpO2 100 %.      General: awake, alert, appropriate, laying on side in bed; NAD HENT: conjugate gaze; oropharynx dry- cortrak in place CV: regular rhythm;  borderline tachycardic rate; no JVD Pulmonary: CTA B/L; no W/R/R- good air movement GI: soft, NT, ND, (+)BS Psychiatric: appropriate- very anxious- shaking in hands B/L  Neurological: more alert- but still delayed  Motor:  Bilateral lower extremities: LE 3/5 HF/KE R foot drop- at rest in PF Skin: unstageable wound with scabbing on back of head 3-4cm in diameter- also has popped/dry blister on L sternum- slough gone from base- -no change on head wound today- large scab/thick on back of head  Assessment/Plan: 1. Functional deficits which require 3+ hours per day of interdisciplinary therapy in a comprehensive inpatient rehab setting. Physiatrist is providing close team supervision and 24 hour management of active medical problems listed below. Physiatrist and rehab team continue to assess barriers to discharge/monitor patient progress toward functional and medical goals  Care Tool:  Bathing    Body parts bathed by patient: Right arm, Left arm, Chest, Abdomen   Body parts bathed by helper: Front perineal area, Buttocks, Right lower leg, Left lower leg     Bathing assist Assist Level: Supervision/Verbal cueing  Upper Body Dressing/Undressing Upper body dressing   What is the patient wearing?: Pull over shirt    Upper body assist Assist Level: Minimal Assistance - Patient > 75%    Lower Body Dressing/Undressing Lower body dressing      What is the patient wearing?: Pants, Underwear/pull up     Lower body assist Assist for lower body dressing: Maximal Assistance - Patient 25 - 49%     Toileting Toileting Toileting Activity did not occur (Clothing management and hygiene only): N/A (no void or bm)  Toileting assist Assist for toileting: Moderate Assistance - Patient 50 - 74%     Transfers Chair/bed transfer  Transfers assist     Chair/bed transfer assist level: Contact Guard/Touching assist     Locomotion Ambulation   Ambulation assist   Ambulation activity  did not occur: Safety/medical concerns  Assist level: 2 helpers Assistive device: Walker-rolling Max distance: 10'   Walk 10 feet activity   Assist  Walk 10 feet activity did not occur: Safety/medical concerns  Assist level: 2 helpers Assistive device: Walker-rolling   Walk 50 feet activity   Assist Walk 50 feet with 2 turns activity did not occur: Safety/medical concerns         Walk 150 feet activity   Assist Walk 150 feet activity did not occur: Safety/medical concerns         Walk 10 feet on uneven surface  activity   Assist Walk 10 feet on uneven surfaces activity did not occur: Safety/medical concerns         Wheelchair     Assist Is the patient using a wheelchair?: Yes Type of Wheelchair: Manual    Wheelchair assist level: Supervision/Verbal cueing Max wheelchair distance: 32'    Wheelchair 50 feet with 2 turns activity    Assist        Assist Level: Supervision/Verbal cueing   Wheelchair 150 feet activity     Assist      Assist Level: Dependent - Patient 0%   Blood pressure 122/72, pulse 100, temperature 99.5 F (37.5 C), resp. rate 20, height 5' 5"  (1.651 m), weight 67.7 kg, SpO2 100 %.  Medical Problem List and Plan: 1. Functional deficits secondary to debility related to VF cardiac arrest secondary to massive pulmonary emboli status post mechanical thrombectomy            D/C SNF once medically appropriate Will d/w SW that pt will be done with IV ABX on 3/8- need to figure out the nutrition aspect- will d/w daughter again- only eating 30-40% of total calories/day-  Con't CIR- PT, OT and SLP 2.  Antithrombotics: -DVT/anticoagulation:  Pharmaceutical: Other (comment) Eliquis- has to use due to saddle PE 2/23- stopped due to plts by Heme/Onc  3/1- restarted Eliquis at 2.5 mg BID             -antiplatelet therapy: N/A 3. Pain Management: Neurontin 100 mg twice daily, oxycodone as needed  Stopped gabapentin due ot  sedation  3/2- pain controlled when takes pain meds 4. Mood anxiety-  Cymbalta 20 mg daily, Xanax 0.25 mg daily as needed provide emotional support  2/21- change to Paxil 20 mg nightly- 2/24- no side effects noted per pt- flat, but looks less anxious  3/1- will increase Paxil to 40 mg nightly for continued anxiety  3/2- still anxious, but just increased Paxil             -antipsychotic agents: N/A 5. Neuropsych: This patient is not capable  of making decisions on her own behalf. 6. Skin/Wound Care: Routine skin checks 7. Fluids/Electrolytes/Nutrition: still not eating much  -continue TF for now.   -hopefully po intake will pick up as mentation improves.   2/20- family wants PEG_ pt kept saying doesn't 2/21- family still wants PEG when possible  2/23- wil do once bacteremia addressed 8.  Pericasular hepatic /hemorrhagic shock/acute blood loss anemia/status post left liver lobe embolization  Hgb down again to 7.7 repeat non contrast CT abd no change in pericapsular hepatic bleed , check stool OB  2/24- Hb 8.0- will con't to monitor  2/28- Hb down slightly to 7.5  3/1- will recheck in AM  3/2- Hb up to 7.8 9.  AKI secondary to ATN.  Follow-up per renal services.  Started clip for AKI- not ESRD, however Cr almost 5 today- getting HD tomorrow 2/8.   2/9- last Cr 5.74- up from 4.97 up from 4.45- rising- per renal- likely HD tomorrow?.  2/24- HD catheter removed due to bacteremia- renal following for possible more HD Appreciate nephro recs  3/3- no more HD per renal- still has Cr ~ 3.2- likely this is new baseline 10.  Thrombocytopenia.  Suspect ITP.  Follow-up per hematology 2/21- Plts were 214- down to 17k- will transfuse and cal Heme/Onc- 2/22- plts up to 27k- this Am after transfusion- Heme/Onc not sure why drop in plts- however said no transfusion unless plts drop<10k  2.23- plts 33k this AM- doing better 2/24- Plts 46k- off Eliquis per Heme/Onc- had saddle PE- but risks/benefits require  her ot be off Eliquis 2/25 platelets 57, continue to monitor 2/28- plts up to 99k- Heme/Onc- said can start Eliquis- will start 2.5 mg BID- will start tomorrow 3/2- plts up to 129k- doing much better 11.  Hyperlipidemia.  Lipitor 12. Diabetes- Poor appetite- has Cortrak for intake due to poor appetite-stop insulin- was on at home- levemir 4 units BID when came to rehab, but dropping CBGs- so will hold- will do CBGs BID Will assess if still needs- d/c insulin due to Tfs  2/11- TF's restarted so CBGs going up- 486 this AM- restarted Levemir 4 units BID and SSI- will titrate as required.    CBG (last 3)  Recent Labs    12/27/21 2044 12/28/21 0001 12/28/21 0407  GLUCAP 206* 159* 98  Increased Levimir to 10U q12h  Changed to moderate SSI q 4h since oral intake is low   Levemir at 14 units BID  2/22- Cbgs still really elevated in 200s-300s- will add Novolog coverage per Dm coordinator 3 units at 8pm, midnight and 4am-   2/23- DM coordinator wants pt to increase coverage to 6 units with TF's.   2/27- CBGs medium to high 100s- low 200s- con't regimen for now  2/28- CBGs 130s-210 overnight- still getting TF's overnight  3/2- CBGs doing MUCH better-con't regimen- changed to qACHS  3/3- Cbgs labile, but better with TF's.   8. New onset confusion/poor memory- anoxia? From prolonged CPR  2/9- will check CT of head- and stop Gabapentin for nerve pain due to increased sedation- had before but so out of it per daughter- much worse last 2 days.   2/15- variable -tangential and  poor memory- will need to d/w daughter  2/21- still confused, but acutely medically ill still- is improving  2/24- poor memory due to prolonged CPR initially, however is doing better- 14. ABLA with chronic anemia  2/13 hb stable at 8.2  2/21- Hb stable at 8.0  2/25-hemoglobin 7.4, continue to monitor 15. Scab on back of head-pressure ulcer unstageable/Blister on sternum   2/24- scab unstageable on back of head and blister  almost healed on L breast.  16. HTN with hx of VF arrest  2/10- pt's BP 178/95- running [id number]/433I- systolic- will d/w renal since not on any BP meds-   2/11- Started norvasc and BP looks better 951 systolic- will con't to monitor trend.  Vitals:   12/27/21 2033 12/28/21 0410  BP: 139/75 122/72  Pulse: 98 100  Resp: 16 20  Temp: 98 F (36.7 C) 99.5 F (37.5 C)  SpO2: 100% 100%   3/2- BP controlled- con't regimen 17. Constipation  2/24- LBM last night- con't to monitor 18. Leukocytosis-UTI/Bacteremia- Klebsiella and E Fecalis   2/15- will remove foley and bladder scan; also check U/A and Cx- foley was per renal .   2/17- pt has UTI- started on Cefepime yesterday- also WBC up to 17k  2/18 WBC down to 13.2K , recheck in am   2/20- WBC up to 18.3k- due to HD catheter and no improvement- actually worse, d/w ID and pharmacy- agreed to change cefepime to rocephin- also start Vanc and check blood Cx's.   2/21- changed to Ampicillin with ID discussion- off Vanc due to E fecalis   2/22- WBC still 16.4- will check daily fo rnow  2/23- WBC 14.2- coming down  2/24- WBC 14.5- Neutrophils 70% so not a left shift- stable-   2/25-WBC 12.8, improving  2/27- labs in AM- cannot get TEE inpt due to thrombocytopenia per Cards- still on Ampicillin-  2/28- WBC 13.4- down from 14.9- con't regimen- will check ESR and CRP- will check another set of blood Cx's   3/1- ESR 79 and CRP 0.8 3/2- WBC 11.9- doing better 19. Fall  2/16- pt on floor last night- denied pain- CT head was ordered - pt refused to keep on mask to go downstairs- will get this AM.  3/1- fell last night- no injuries  3/2- moved closer to desk 20. Poor appetite-  2/17- will get PEG placed next week so can remove Cortrak- spoke with PA to get ordered  2/20- order in, but cannot do when ill/sick/  2/21- wait on PEG- con't cortrak  2/22- pt eating entire banana this AM- which is new  2/27- con't Cortrak/TF's at night-  2/28- placed nutrition  consult- to see if can not do PEG/ can stop TFs' ? Wait to remove Cortrak. Pt doesn't want PEG_ will d/ PEG order for now- and see if pt can eat more.   3/1- calorie count in progress  3/3- pt eating 30-40% of daily intake- only ~10% of meals- will try and d/w daughters again that she really would benefit from PEG, however cannot make this occur. However, pt isn't eating enough- bottom line.  21. Anxiety  2/27- appears worse this AM- will ask nursing to give AM anxiety meds  3/1- will increase Paxil to 40 mg nightly 22. Mass on liver?  2/28- needs outpt w/u with Heme/Onc.  23. Dispo  2/28- cannot get Medical POA so interim guardianship- [name] 24. hyperkalemia  2/28- will start [place]- put in by Nephrology  3/1- K+ stilll 5.4- will stop bananas and OJ in tray- and con't [place]- recheck labs in AM  3/3- will recheck labs Monday -wrote for no bananas.     I spent a total of 35   minutes on total care today- >50% coordination of care- due to high  decision making and d/w with PA.   Per ID note, will need IV ABX til 3/8- unless leaves, then can switch to renally dose Amoxicillin  Get 2 sets of blood C'x-s 1 week after end of ABX- ~ 3/15 for surveillance.   LOS: 24 days A FACE TO FACE EVALUATION WAS PERFORMED  Elain Wixon 12/28/2021, 7:49 AM

## 2021-12-28 NOTE — Progress Notes (Signed)
Occupational Therapy Session Note ? ?Patient Details  ?Name: [name] ?MRN: [id number] ?Date of Birth: 1951/10/02 ? ?Today's Date: 12/28/2021 ?OT Individual Time: [phone] ?OT Individual Time Calculation (min): 71 min  ? ? ?Short Term Goals: ?Week 4:  OT Short Term Goal 1 (Week 4): STG=LTG 2/2 ELOS ? ?Skilled Therapeutic Interventions/Progress Updates:  ?Pt greeted supine in bed  agreeable to OT intervention. Session focus on BADL reeducation, functional mobility and decreasing overall caregiver burden. Pt completed supine>sit with CGA. Pt agreeable to mobilize OOB, MIN A to stand from EOB with MOD cues for hand placement. Pt able to pivot to w/c with MIN A +2 safety. Pt sat in w/c to eat breakfast, set- up assist to open items such as peaches. Pt demonstrates selective attention during self feeding tasks needing MIN verbal cues to attend to task at hand. Pt very distracted by extraneous stimuli needing OTA to turn off TV or remove distractions in room such as extra items on tray. Pt with disorganized thinking needing OTA to help her make a list. Pt agreeable to completed bathing at sink with overall MIN A, pt able to stand with MIN A and needing MINA for dynamic standing balance while pt completed LB bathing. Pt completed UB dressing with set- up assist and MIN A for LB dressing to pull pants up to waist line in standing. Pt completed stand pivot back to bed with Rw with MIN A. CGA for sit>supine. pt left supine in bed with bed alarm activated and all needs within reach.                    ? ? ?Therapy Documentation ?Precautions:  ?Precautions ?Precautions: Fall, Other (comment) ?Precaution Comments: cortrak; right leg discomfort - PTA ?Restrictions ?Weight Bearing Restrictions: No ? ?Pain: no pain reported during session  ? ? ? ?Therapy/Group: Individual Therapy ? ?[name] ?12/28/2021, 10:49 AM ?

## 2021-12-29 DIAGNOSIS — G931 Anoxic brain damage, not elsewhere classified: Secondary | ICD-10-CM

## 2021-12-29 LAB — LACTATE DEHYDROGENASE: LDH: 221 U/L — ABNORMAL HIGH (ref 98–192)

## 2021-12-29 LAB — GLUCOSE, CAPILLARY
Glucose-Capillary: 118 mg/dL — ABNORMAL HIGH (ref 70–99)
Glucose-Capillary: 141 mg/dL — ABNORMAL HIGH (ref 70–99)
Glucose-Capillary: 160 mg/dL — ABNORMAL HIGH (ref 70–99)
Glucose-Capillary: 193 mg/dL — ABNORMAL HIGH (ref 70–99)
Glucose-Capillary: 59 mg/dL — ABNORMAL LOW (ref 70–99)
Glucose-Capillary: 62 mg/dL — ABNORMAL LOW (ref 70–99)
Glucose-Capillary: 75 mg/dL (ref 70–99)

## 2021-12-29 MED ORDER — GLUCOSE 40 % PO GEL
1.0000 | ORAL | Status: AC
  Administered 2021-12-29: 31 g via ORAL

## 2021-12-29 MED ORDER — GLUCOSE 40 % PO GEL
ORAL | Status: AC
  Filled 2021-12-29: qty 1.21

## 2021-12-29 NOTE — Progress Notes (Signed)
Physical Therapy Session Note ? ?Patient Details  ?Name: [name] ?MRN: [id number] ?Date of Birth: January 08, 1951 ? ?Today's Date: 12/29/2021 ?PT Individual Time: 5320-2334 ?PT Individual Time Calculation (min): 54 min  ? ?Short Term Goals: ?Week 1:  PT Short Term Goal 1 (Week 1): Pt will initiate standing with RW ?PT Short Term Goal 1 - Progress (Week 1): Not met ?PT Short Term Goal 2 (Week 1): Pt will tolerate sitting OOB between sessions ?PT Short Term Goal 2 - Progress (Week 1): Met ?PT Short Term Goal 3 (Week 1): Pt will tolerate x 5 min therapeutic exercise ?PT Short Term Goal 3 - Progress (Week 1): Met ? ?Skilled Therapeutic Interventions/Progress Updates:  ?  pt received in bed and agreeable to therapy. Pt reports indigestion but no pain. Semi-reclined>sit with supervision with heavy use of bed rail and incr time. Pt then donned pants with mod A to thread and pull over hips, donned shoes tot A for time. Sit to stand largely min A this session, pt attempting to pull with both hands on walker consistently and requiring cues, as well as physical assist to overcome posterior lean. Pt agreeable to going outside to walk. ambulatory transfer to w/c with RW and min A. At this time pt brushed hair with supervision. Pt moderately confused at times, confusing this therapist with another and making occ confabulatory statements ("there's wind in the sun"). Pt transported to [facility] entrance for time. Pt ambulated x 30 ft and 25 ft with min A and RW, consistent multimodal cueing to maintain RW proximity. Pt did sit impulsively on picnic table seat with mod A to sit safely. Pt then laid head on table for several minutes to rest before walking back to w/c. Pt opted to remain in w/c on return to room and was left with all needs in reach and alarm active.  ? ?Therapy Documentation ?Precautions:  ?Precautions ?Precautions: Fall, Other (comment) ?Precaution Comments: cortrak; right leg discomfort - PTA ?Restrictions ?Weight Bearing  Restrictions: No ? ? ? ?Therapy/Group: Individual Therapy ? ?[place] ?12/29/2021, 1:56 PM  ?

## 2021-12-29 NOTE — Progress Notes (Signed)
PROGRESS NOTE   Subjective/Complaints: Pt sidelying in bed. A little anxious. Actually wants to eat her breakfast this morning  ROS: Limited due to cognitive/behavioral   Objective:   [name] EKG SITE RITE  Result Date: 12/27/2021 If Site Rite image not attached, placement could not be confirmed due to current cardiac rhythm.  Recent Labs    12/27/21 0518  WBC 11.9*  HGB 7.8*  HCT 24.3*  PLT 129*   Recent Labs    12/27/21 0518  NA 141  K 4.9  CL 106  CO2 23  GLUCOSE 110*  BUN 42*  CREATININE 3.15*  CALCIUM 9.4    Intake/Output Summary (Last 24 hours) at 12/29/2021 1037 Last data filed at 12/29/2021 1443 Gross per 24 hour  Intake 627 ml  Output --  Net 627 ml     Pressure Injury 12/04/21 Head Posterior Unstageable - Full thickness tissue loss in which the base of the injury is covered by slough (yellow, tan, gray, green or brown) and/or eschar (tan, brown or black) in the wound bed. 3 cm round, scabbed area to po (Active)  12/04/21 2000  Location: Head  Location Orientation: Posterior  Staging: Unstageable - Full thickness tissue loss in which the base of the injury is covered by slough (yellow, tan, gray, green or brown) and/or eschar (tan, brown or black) in the wound bed.  Wound Description (Comments): 3 cm round, scabbed area to posterior head.  Present on Admission: Yes    Physical Exam: Vital Signs Blood pressure 90/76, pulse 95, temperature 99.2 F (37.3 C), temperature source Oral, resp. rate 18, height 5' 5"  (1.651 m), weight 67.2 kg, SpO2 98 %.     Constitutional: No distress . Vital signs reviewed. HEENT: NCAT, EOMI, oral membranes moist, NGT Neck: supple Cardiovascular: RRR without murmur. No JVD    Respiratory/Chest: CTA Bilaterally without wheezes or rales. Normal effort    GI/Abdomen: BS +, non-tender, non-distended Ext: no clubbing, cyanosis, or edema Psych: pleasant and cooperative, sl  anxious Neuro: alert, confused, decreased insight and awareness but able to follow basic commands and participate in conversation Bilateral lower extremities: LE 3/5 HF/KE R foot drop- at rest in PF Skin: unstageable wound with scabbing on back of head 3-4cm in diameter- also has popped/dry blister on L sternum- slough gone from base- -no change on head wound today- large scab/thick on back of head  Assessment/Plan: 1. Functional deficits which require 3+ hours per day of interdisciplinary therapy in a comprehensive inpatient rehab setting. Physiatrist is providing close team supervision and 24 hour management of active medical problems listed below. Physiatrist and rehab team continue to assess barriers to discharge/monitor patient progress toward functional and medical goals  Care Tool:  Bathing    Body parts bathed by patient: Right arm, Left arm, Chest, Abdomen, Front perineal area, Buttocks, Right upper leg, Left upper leg, Face   Body parts bathed by helper: Front perineal area, Buttocks, Right lower leg, Left lower leg     Bathing assist Assist Level: Minimal Assistance - Patient > 75%     Upper Body Dressing/Undressing Upper body dressing   What is the patient wearing?: Pull over shirt  Upper body assist Assist Level: Set up assist    Lower Body Dressing/Undressing Lower body dressing      What is the patient wearing?: Pants, Underwear/pull up     Lower body assist Assist for lower body dressing: Minimal Assistance - Patient > 75%     Toileting Toileting Toileting Activity did not occur (Clothing management and hygiene only): N/A (no void or bm)  Toileting assist Assist for toileting: Moderate Assistance - Patient 50 - 74%     Transfers Chair/bed transfer  Transfers assist     Chair/bed transfer assist level: Minimal Assistance - Patient > 75%     Locomotion Ambulation   Ambulation assist   Ambulation activity did not occur: Safety/medical  concerns  Assist level: 2 helpers Assistive device: Walker-rolling Max distance: 10'   Walk 10 feet activity   Assist  Walk 10 feet activity did not occur: Safety/medical concerns  Assist level: 2 helpers Assistive device: Walker-rolling   Walk 50 feet activity   Assist Walk 50 feet with 2 turns activity did not occur: Safety/medical concerns         Walk 150 feet activity   Assist Walk 150 feet activity did not occur: Safety/medical concerns         Walk 10 feet on uneven surface  activity   Assist Walk 10 feet on uneven surfaces activity did not occur: Safety/medical concerns         Wheelchair     Assist Is the patient using a wheelchair?: Yes Type of Wheelchair: Manual    Wheelchair assist level: Supervision/Verbal cueing Max wheelchair distance: 17'    Wheelchair 50 feet with 2 turns activity    Assist        Assist Level: Supervision/Verbal cueing   Wheelchair 150 feet activity     Assist      Assist Level: Dependent - Patient 0%   Blood pressure 90/76, pulse 95, temperature 99.2 F (37.3 C), temperature source Oral, resp. rate 18, height 5' 5"  (1.651 m), weight 67.2 kg, SpO2 98 %.  Medical Problem List and Plan: 1. Functional deficits secondary to debility related to VF cardiac arrest secondary to massive pulmonary emboli status post mechanical thrombectomy            D/C SNF once medically appropriate Will d/w SW that pt will be done with IV ABX on 3/8- need to figure out the nutrition aspect- will d/w daughter again- only eating 30-40% of total calories/day-  -Continue CIR therapies including PT, OT, and SLP  2.  Antithrombotics: -DVT/anticoagulation:  Pharmaceutical: Other (comment) Eliquis- has to use due to saddle PE 2/23- stopped due to plts by Heme/Onc  3/1- restarted Eliquis at 2.5 mg BID             -antiplatelet therapy: N/A 3. Pain Management: Neurontin 100 mg twice daily, oxycodone as needed  Stopped  gabapentin due ot sedation  3/2- pain controlled when takes pain meds 4. Mood anxiety-  Cymbalta 20 mg daily, Xanax 0.25 mg daily as needed provide emotional support  2/21- change to Paxil 20 mg nightly- 2/24- no side effects noted per pt- flat, but looks less anxious  3/1- increased Paxil to 40 mg nightly for continued anxiety             -antipsychotic agents: N/A 5. Neuropsych: This patient is not capable of making decisions on her own behalf. 6. Skin/Wound Care: Routine skin checks 7. Fluids/Electrolytes/Nutrition: still not eating much  -continue TF for  now.   -hopefully po intake will pick up as mentation improves.   2/20- family wants PEG_ pt kept saying doesn't 2/21- family still wants PEG when possible  2/23- wil do once bacteremia addressed 8.  Pericasular hepatic /hemorrhagic shock/acute blood loss anemia/status post left liver lobe embolization  Hgb down again to 7.7 repeat non contrast CT abd no change in pericapsular hepatic bleed , check stool OB  2/24- Hb 8.0- will con't to monitor  2/28- Hb down slightly to 7.5  3/1- will recheck in AM  3/2- Hb up to 7.8 9.  AKI secondary to ATN.  Follow-up per renal services.  Started clip for AKI- not ESRD, however Cr almost 5 today- getting HD tomorrow 2/8.   2/9- last Cr 5.74- up from 4.97 up from 4.45- rising- per renal- likely HD tomorrow?.  2/24- HD catheter removed due to bacteremia- renal following for possible more HD Appreciate nephro recs 3/3- no more HD per renal- still has Cr ~ 3.2- likely this is new baseline 10.  Thrombocytopenia.  Suspect ITP.  Follow-up per hematology 2/21- Plts were 214- down to 17k- will transfuse and cal Heme/Onc- 2/22- plts up to 27k- this Am after transfusion- Heme/Onc not sure why drop in plts- however said no transfusion unless plts drop<10k  2.23- plts 33k this AM- doing better 2/24- Plts 46k- off Eliquis per Heme/Onc- had saddle PE- but risks/benefits require her ot be off Eliquis 2/25  platelets 57, continue to monitor 2/28- plts up to 99k- Heme/Onc- said can start Eliquis- will start 2.5 mg BID- will start tomorrow 3/2- plts up to 129k- doing much better 11.  Hyperlipidemia.  Lipitor 12. Diabetes- Poor appetite- has Cortrak for intake due to poor appetite-stop insulin- was on at home- levemir 4 units BID when came to rehab, but dropping CBGs- so will hold- will do CBGs BID Will assess if still needs- d/c insulin due to Tfs  2/11- TF's restarted so CBGs going up- 486 this AM- restarted Levemir 4 units BID and SSI- will titrate as required.    CBG (last 3)  Recent Labs    12/29/21 0028 12/29/21 0111 12/29/21 0406  GLUCAP 59* 118* 193*  Increased Levimir to 10U q12h  Changed to moderate SSI q 4h since oral intake is low   Levemir at 14 units BID  2/22- Cbgs still really elevated in 200s-300s- will add Novolog coverage per Dm coordinator 3 units at 8pm, midnight and 4am-   2/23- DM coordinator wants pt to increase coverage to 6 units with TF's.   2/27- CBGs medium to high 100s- low 200s- con't regimen for now  2/28- CBGs 130s-210 overnight- still getting TF's overnight  3/2- CBGs doing [facility] better-con't regimen- changed to qACHS  3/3-4- some improvement but remain inconsistent  13. New onset confusion/poor memory- anoxia? From prolonged CPR  2/9- will check CT of head- and stop Gabapentin for nerve pain due to increased sedation- had before but so out of it per daughter- much worse last 2 days.   2/15- variable -tangential and  poor memory- will need to d/w daughter  2/21- still confused, but acutely medically ill still- is improving  2/24- poor memory due to prolonged CPR initially, however is doing better- 14. ABLA with chronic anemia  2/13 hb stable at 8.2  2/21- Hb stable at 8.0  2/25-hemoglobin 7.4, continue to monitor 15. Scab on back of head-pressure ulcer unstageable/Blister on sternum   2/24- scab unstageable on back of head and blister  almost healed on L  breast.  16. HTN with hx of VF arrest  2/10- pt's BP 178/95- running 948A/165V- systolic- will d/w renal since not on any BP meds-   2/11- Started norvasc and BP looks better 374 systolic- will con't to monitor trend.  Vitals:   12/28/21 1912 12/29/21 0408  BP: 136/81 90/76  Pulse: (!) 103 95  Resp: 20 18  Temp: 98.1 F (36.7 C) 99.2 F (37.3 C)  SpO2: 100% 98%   3/4- BP controlled- con't regimen 17. Constipation  2/24- LBM last night- con't to monitor 18. Leukocytosis-UTI/Bacteremia- Klebsiella and E Fecalis   2/15- will remove foley and bladder scan; also check U/A and Cx- foley was per renal .   2/17- pt has UTI- started on Cefepime yesterday- also WBC up to 17k  2/18 WBC down to 13.2K , recheck in am   2/20- WBC up to 18.3k- due to HD catheter and no improvement- actually worse, d/w ID and pharmacy- agreed to change cefepime to rocephin- also start Vanc and check blood Cx's.   2/21- changed to Ampicillin with ID discussion- off Vanc due to E fecalis   2/22- WBC still 16.4- will check daily fo rnow  2/23- WBC 14.2- coming down  2/24- WBC 14.5- Neutrophils 70% so not a left shift- stable-   2/25-WBC 12.8, improving  2/27- labs in AM- cannot get TEE inpt due to thrombocytopenia per Cards- still on Ampicillin-  2/28- WBC 13.4- down from 14.9- con't regimen- will check ESR and CRP- will check another set of blood Cx's   3/1- ESR 79 and CRP 0.8 3/2- WBC 11.9- doing better 19. Fall  2/16- pt on floor last night- denied pain- CT head was ordered - pt refused to keep on mask to go downstairs- will get this AM.  3/1- fell last night- no injuries  3/2- moved closer to desk 20. Poor appetite-  2/17- will get PEG placed next week so can remove Cortrak- spoke with PA to get ordered  2/20- order in, but cannot do when ill/sick/  2/21- wait on PEG- con't cortrak  2/22- pt eating entire banana this AM- which is new  2/27- con't Cortrak/TF's at night-  2/28- placed nutrition consult- to  see if can not do PEG/ can stop TFs' ? Wait to remove Cortrak. Pt doesn't want PEG_ will d/ PEG order for now- and see if pt can eat more.   3/1- calorie count in progress  3/4 intake 10-30%, discussion re: PEG per Dr. Dagoberto Ligas and family/pt.  21. Anxiety  2/27- appears worse this AM- will ask nursing to give AM anxiety meds  3/1- increased Paxil to 40 mg nightly 22. Mass on liver?  2/28- needs outpt w/u with Heme/Onc.  23. Dispo  2/28- cannot get Medical POA so interim guardianship- [name] 24. hyperkalemia  2/28- will start [place]- put in by Nephrology  3/1- K+ stilll 5.4- will stop bananas and OJ in tray- and con't [place]- recheck labs in AM  3/3- will recheck labs Monday -wrote for no bananas.       *Per ID note, will need IV ABX til 3/8- unless leaves, then can switch to renally dose Amoxicillin  Get 2 sets of blood C'x-s 1 week after end of ABX- ~ 3/15 for surveillance.   LOS: 25 days A FACE TO FACE EVALUATION WAS PERFORMED  Meredith Staggers 12/29/2021, 10:37 AM

## 2021-12-29 NOTE — Significant Event (Addendum)
Hypoglycemic Event ? ?CBG: 62 ? ?Treatment: 4 oz juice/soda ? ?Symptoms: None ? ?Follow-up CBG: Time: 0028 CBG Result:59 ? ?Possible Reasons for Event: Unknown ? ?Comments/MD notified: Pt is asymptomatic at this time. Oral glucose gel given to pt per hypoglycemic protocol. CBG will be rechecked in 20 mins ? ? ? ? ? ? ? ?Hypoglycemic Event ? ?CBG: 59 ? ?Treatment: 1 tube glucose gel 1/2 of the tube ? ? ?Symptoms: None ? ?Follow-up CBG: Time:0013 CBG Result:118 ? ?Possible Reasons for Event: Unknown ? ?Comments/MD notified: Pt asymptomatic, interventions effective. Charge RN [place] aware.  ? ? ? ?Kristee Angus M Gaudencio Chesnut ? ? ? ? ?Satin Boal M Claribel Sachs ? ? ?

## 2021-12-29 NOTE — Progress Notes (Signed)
Speech Language Pathology Daily Session Note ? ?Patient Details  ?Name: DONTAVIA BRAND ?MRN: [id number] ?Date of Birth: Oct 30, 1950 ? ?Today's Date: 12/29/2021 ?SLP Individual Time: 0800-0900 ?SLP Individual Time Calculation (min): 60 min ? ?Short Term Goals: ?Week 4: SLP Short Term Goal 1 (Week 4): STG=LTG due to short ELOS ? ?Skilled Therapeutic Interventions: ?Pt seen for skilled ST with focus on cognitive goals, pt in bed and agreeable to therapeutic tasks. Breakfast meal untouched next to pt, pt coming EOB with extra time to consume with encouragement. SLP assisting with set up and providing overall min A verbal cues to increase attention to task to promote PO intake (pt consuming <25%). Pt utilizing external cues (therapy schedule, cell phone, etc) to increase awareness and carryover of orientation concepts and plan for the day. SLP facilitating functional problem solving task related to current and discharge environment (SNF and home) by providing overall mod A cues to ID how current physical and cognitive impairments will impact function. Pt left in bed with alarm set and nursing present for med administration. Cont ST POC.  ? ?Pain ?Pain Assessment ?Pain Scale: 0-10 ?Pain Score: 0-No pain ? ?Therapy/Group: Individual Therapy ? ?Dewaine Conger ?12/29/2021, 8:36 AM ?

## 2021-12-30 LAB — GLUCOSE, CAPILLARY
Glucose-Capillary: 106 mg/dL — ABNORMAL HIGH (ref 70–99)
Glucose-Capillary: 123 mg/dL — ABNORMAL HIGH (ref 70–99)
Glucose-Capillary: 139 mg/dL — ABNORMAL HIGH (ref 70–99)
Glucose-Capillary: 185 mg/dL — ABNORMAL HIGH (ref 70–99)
Glucose-Capillary: 75 mg/dL (ref 70–99)
Glucose-Capillary: 96 mg/dL (ref 70–99)

## 2021-12-30 LAB — BASIC METABOLIC PANEL
Anion gap: 10 (ref 5–15)
BUN: 44 mg/dL — ABNORMAL HIGH (ref 8–23)
CO2: 21 mmol/L — ABNORMAL LOW (ref 22–32)
Calcium: 9.4 mg/dL (ref 8.9–10.3)
Chloride: 107 mmol/L (ref 98–111)
Creatinine, Ser: 3.02 mg/dL — ABNORMAL HIGH (ref 0.44–1.00)
GFR, Estimated: 16 mL/min — ABNORMAL LOW (ref >60)
Glucose, Bld: 129 mg/dL — ABNORMAL HIGH (ref 70–99)
Potassium: 4.6 mmol/L (ref 3.5–5.1)
Sodium: 138 mmol/L (ref 135–145)

## 2021-12-30 LAB — CULTURE, BLOOD (ROUTINE X 2)
Culture: NO GROWTH
Culture: NO GROWTH
Special Requests: ADEQUATE

## 2021-12-30 LAB — LACTATE DEHYDROGENASE: LDH: 221 U/L — ABNORMAL HIGH (ref 98–192)

## 2021-12-30 NOTE — Progress Notes (Signed)
Physical Therapy Session Note ? ?Patient Details  ?Name: [name] ?MRN: [id number] ?Date of Birth: [birth year] ? ?Today's Date: 12/30/2021 ?PT Individual Time: 4098-1191 ?PT Individual Time Calculation (min): 55 min  ? ?Short Term Goals: ?Week 4:  PT Short Term Goal 1 (Week 4): =LTG due to ELOS ? ?Skilled Therapeutic Interventions/Progress Updates:  ?  Pt received sidelying in bed asleep, arousable and with max encouragement and increased time agreeable to PT session. Pt reports ongoing pain in R foot and some tenderness in R calf region. Pt declines intervention for pain at this time. Bed mobility Supervision with use of bedrail and HOB elevated. Sit to stand with CGA to min A to RW during session with increased time needed to initiate transfer, cues for safe UE placement and anterior weight shift. Stand pivot transfer to w/c with RW and CGA, cues for safety during transfer. Pt taken outdoors for improved mood and therapy buy-in. Ambulation outdoors with RW and min A for balance x 20 ft, x 40 ft. Pt exhibits some impulsivity turning to sit on benches outdoors without letting therapist know she is sitting. Pt requests to return to bed at end of session. Stand pivot transfer w/c to bed with RW and CGA. Sit to supine Supervision. Pt left seated in bed with needs in reach, family present. ? ?Therapy Documentation ?Precautions:  ?Precautions ?Precautions: Fall, Other (comment) ?Precaution Comments: cortrak; right leg discomfort - PTA ?Restrictions ?Weight Bearing Restrictions: No ? ? ? ? ?Therapy/Group: Individual Therapy ? ? ?Excell Seltzer, PT, DPT, CSRS ? ?12/30/2021, 2:56 PM  ?

## 2021-12-31 LAB — GLUCOSE, CAPILLARY
Glucose-Capillary: 112 mg/dL — ABNORMAL HIGH (ref 70–99)
Glucose-Capillary: 123 mg/dL — ABNORMAL HIGH (ref 70–99)
Glucose-Capillary: 196 mg/dL — ABNORMAL HIGH (ref 70–99)
Glucose-Capillary: 217 mg/dL — ABNORMAL HIGH (ref 70–99)
Glucose-Capillary: 339 mg/dL — ABNORMAL HIGH (ref 70–99)
Glucose-Capillary: 62 mg/dL — ABNORMAL LOW (ref 70–99)
Glucose-Capillary: 73 mg/dL (ref 70–99)
Glucose-Capillary: 77 mg/dL (ref 70–99)

## 2021-12-31 LAB — COMPREHENSIVE METABOLIC PANEL
ALT: 36 U/L (ref 0–44)
AST: 24 U/L (ref 15–41)
Albumin: 2.7 g/dL — ABNORMAL LOW (ref 3.5–5.0)
Alkaline Phosphatase: 68 U/L (ref 38–126)
Anion gap: 10 (ref 5–15)
BUN: 43 mg/dL — ABNORMAL HIGH (ref 8–23)
CO2: 22 mmol/L (ref 22–32)
Calcium: 9.1 mg/dL (ref 8.9–10.3)
Chloride: 106 mmol/L (ref 98–111)
Creatinine, Ser: 3.02 mg/dL — ABNORMAL HIGH (ref 0.44–1.00)
GFR, Estimated: 16 mL/min — ABNORMAL LOW (ref >60)
Glucose, Bld: 86 mg/dL (ref 70–99)
Potassium: 4.8 mmol/L (ref 3.5–5.1)
Sodium: 138 mmol/L (ref 135–145)
Total Bilirubin: 0.4 mg/dL (ref 0.3–1.2)
Total Protein: 6.8 g/dL (ref 6.5–8.1)

## 2021-12-31 LAB — CBC WITH DIFFERENTIAL/PLATELET
Abs Immature Granulocytes: 0.04 10*3/uL (ref 0.00–0.07)
Basophils Absolute: 0.1 10*3/uL (ref 0.0–0.1)
Basophils Relative: 1 %
Eosinophils Absolute: 0.2 10*3/uL (ref 0.0–0.5)
Eosinophils Relative: 2 %
HCT: 23 % — ABNORMAL LOW (ref 36.0–46.0)
Hemoglobin: 7.5 g/dL — ABNORMAL LOW (ref 12.0–15.0)
Immature Granulocytes: 0 %
Lymphocytes Relative: 19 %
Lymphs Abs: 2.2 10*3/uL (ref 0.7–4.0)
MCH: 28.8 pg (ref 26.0–34.0)
MCHC: 32.6 g/dL (ref 30.0–36.0)
MCV: 88.5 fL (ref 80.0–100.0)
Monocytes Absolute: 0.8 10*3/uL (ref 0.1–1.0)
Monocytes Relative: 7 %
Neutro Abs: 8.4 10*3/uL — ABNORMAL HIGH (ref 1.7–7.7)
Neutrophils Relative %: 71 %
Platelets: 201 10*3/uL (ref 150–400)
RBC: 2.6 MIL/uL — ABNORMAL LOW (ref 3.87–5.11)
RDW: 15 % (ref 11.5–15.5)
WBC: 11.8 10*3/uL — ABNORMAL HIGH (ref 4.0–10.5)
nRBC: 0 % (ref 0.0–0.2)

## 2021-12-31 LAB — LACTATE DEHYDROGENASE: LDH: 204 U/L — ABNORMAL HIGH (ref 98–192)

## 2021-12-31 NOTE — Progress Notes (Signed)
PROGRESS NOTE   Subjective/Complaints: Pt report she's cold- room at 72 degrees, but feels cold.  BG 123.  Feels like a capsule of some kind stuck in her tube? Also c/o dry lips.   Asking if can have shower- explained will have to check.    ROS: limited due to cognition/behavior  Objective:   No results found. Recent Labs    12/31/21 0334  WBC 11.8*  HGB 7.5*  HCT 23.0*  PLT 201   Recent Labs    12/30/21 0224 12/31/21 0334  NA 138 138  K 4.6 4.8  CL 107 106  CO2 21* 22  GLUCOSE 129* 86  BUN 44* 43*  CREATININE 3.02* 3.02*  CALCIUM 9.4 9.1    Intake/Output Summary (Last 24 hours) at 12/31/2021 8889 Last data filed at 12/31/2021 0700 Gross per 24 hour  Intake 250 ml  Output 1 ml  Net 249 ml     Pressure Injury 12/04/21 Head Posterior Unstageable - Full thickness tissue loss in which the base of the injury is covered by slough (yellow, tan, gray, green or brown) and/or eschar (tan, brown or black) in the wound bed. 3 cm round, scabbed area to po (Active)  12/04/21 2000  Location: Head  Location Orientation: Posterior  Staging: Unstageable - Full thickness tissue loss in which the base of the injury is covered by slough (yellow, tan, gray, green or brown) and/or eschar (tan, brown or black) in the wound bed.  Wound Description (Comments): 3 cm round, scabbed area to posterior head.  Present on Admission: Yes    Physical Exam: Vital Signs Blood pressure 121/66, pulse 96, temperature 97.8 F (36.6 C), resp. rate 20, height 5' 5" (1.651 m), weight 67.2 kg, SpO2 100 %.      General: awake, alert, appropriate, laying on L side cuddled with pillow; NAD HENT: conjugate gaze; oropharynx dry- lips dry; flaking; cortrak in place CV: regular rate and rhythm- rate 90s; no JVD Pulmonary: CTA B/L; no W/R/R- decreased at bases GI: soft, NT, ND, (+)BS Psychiatric: appropriate- tangential Neurological: alert- still  hard to understand when tangential- doesn't explain herself well Ext: no clubbing, cyanosis, or edema Psych: pleasant and cooperative, sl anxious Neuro: alert, confused, decreased insight and awareness but able to follow basic commands and participate in conversation Bilateral lower extremities: LE 3/5 HF/KE R foot drop- at rest in PF Skin: unstageable wound with scabbing on back of head 3-4cm in diameter- also has popped/dry blister on L sternum- slough gone from base- -no change on head wound today- large scab/thick on back of head  Assessment/Plan: 1. Functional deficits which require 3+ hours per day of interdisciplinary therapy in a comprehensive inpatient rehab setting. Physiatrist is providing close team supervision and 24 hour management of active medical problems listed below. Physiatrist and rehab team continue to assess barriers to discharge/monitor patient progress toward functional and medical goals  Care Tool:  Bathing    Body parts bathed by patient: Right arm, Left arm, Chest, Abdomen, Front perineal area, Buttocks, Right upper leg, Left upper leg, Face   Body parts bathed by helper: Front perineal area, Buttocks, Right lower leg, Left lower leg  Bathing assist Assist Level: Minimal Assistance - Patient > 75%     Upper Body Dressing/Undressing Upper body dressing   What is the patient wearing?: Pull over shirt    Upper body assist Assist Level: Set up assist    Lower Body Dressing/Undressing Lower body dressing      What is the patient wearing?: Pants, Underwear/pull up     Lower body assist Assist for lower body dressing: Minimal Assistance - Patient > 75%     Toileting Toileting Toileting Activity did not occur (Clothing management and hygiene only): N/A (no void or bm)  Toileting assist Assist for toileting: Moderate Assistance - Patient 50 - 74%     Transfers Chair/bed transfer  Transfers assist     Chair/bed transfer assist level: Minimal  Assistance - Patient > 75%     Locomotion Ambulation   Ambulation assist   Ambulation activity did not occur: Safety/medical concerns  Assist level: 2 helpers Assistive device: Walker-rolling Max distance: 10'   Walk 10 feet activity   Assist  Walk 10 feet activity did not occur: Safety/medical concerns  Assist level: 2 helpers Assistive device: Walker-rolling   Walk 50 feet activity   Assist Walk 50 feet with 2 turns activity did not occur: Safety/medical concerns         Walk 150 feet activity   Assist Walk 150 feet activity did not occur: Safety/medical concerns         Walk 10 feet on uneven surface  activity   Assist Walk 10 feet on uneven surfaces activity did not occur: Safety/medical concerns         Wheelchair     Assist Is the patient using a wheelchair?: Yes Type of Wheelchair: Manual    Wheelchair assist level: Supervision/Verbal cueing Max wheelchair distance: 22'    Wheelchair 50 feet with 2 turns activity    Assist        Assist Level: Supervision/Verbal cueing   Wheelchair 150 feet activity     Assist      Assist Level: Dependent - Patient 0%   Blood pressure 121/66, pulse 96, temperature 97.8 F (36.6 C), resp. rate 20, height 5' 5" (1.651 m), weight 67.2 kg, SpO2 100 %.  Medical Problem List and Plan: 1. Functional deficits secondary to debility related to VF cardiac arrest secondary to massive pulmonary emboli status post mechanical thrombectomy            D/C SNF once medically appropriate Will d/w SW that pt will be done with IV ABX on 3/8- need to figure out the nutrition aspect- will d/w daughter again- only eating 30-40% of total calories/day-  3/6- will call daughter tomorrow to discuss issues with eating- pt angry when topic brought up- con't CIR- PT, OT and SLP 2.  Antithrombotics: -DVT/anticoagulation:  Pharmaceutical: Other (comment) Eliquis- has to use due to saddle PE 2/23- stopped due to  plts by Heme/Onc  3/1- restarted Eliquis at 2.5 mg BID  3/6- changed to 5 mg BID since plts back up.              -antiplatelet therapy: N/A 3. Pain Management: Neurontin 100 mg twice daily, oxycodone as needed  Stopped gabapentin due ot sedation  3/2- pain controlled when takes pain meds 4. Mood anxiety-  Cymbalta 20 mg daily, Xanax 0.25 mg daily as needed provide emotional support  2/21- change to Paxil 20 mg nightly- 2/24- no side effects noted per pt- flat, but looks less anxious  3/1- increased Paxil to 40 mg nightly for continued anxiety             -antipsychotic agents: N/A 5. Neuropsych: This patient is not capable of making decisions on her own behalf. 6. Skin/Wound Care: Routine skin checks 7. Fluids/Electrolytes/Nutrition: still not eating much  -continue TF for now.   -hopefully po intake will pick up as mentation improves.   2/20- family wants PEG_ pt kept saying doesn't 2/21- family still wants PEG when possible  2/23- wil do once bacteremia addressed 8.  Pericasular hepatic /hemorrhagic shock/acute blood loss anemia/status post left liver lobe embolization  Hgb down again to 7.7 repeat non contrast CT abd no change in pericapsular hepatic bleed , check stool OB  2/24- Hb 8.0- will con't to monitor  2/28- Hb down slightly to 7.5  3/1- will recheck in AM  3/2- Hb up to 7.8  3/6- Hb 7.5 again- slowly trending down 9.  AKI secondary to ATN.  Follow-up per renal services.  Started clip for AKI- not ESRD, however Cr almost 5 today- getting HD tomorrow 2/8.   2/9- last Cr 5.74- up from 4.97 up from 4.45- rising- per renal- likely HD tomorrow?.  2/24- HD catheter removed due to bacteremia- renal following for possible more HD Appreciate nephro recs 3/3- no more HD per renal- still has Cr ~ 3.2- likely this is new baseline 3/6- Cr down to 3.02- stable.  10.  Thrombocytopenia.  Suspect ITP.  Follow-up per hematology 2/21- Plts were 214- down to 17k- will transfuse and cal  Heme/Onc- 2/22- plts up to 27k- this Am after transfusion- Heme/Onc not sure why drop in plts- however said no transfusion unless plts drop<10k  2.23- plts 33k this AM- doing better 2/24- Plts 46k- off Eliquis per Heme/Onc- had saddle PE- but risks/benefits require her ot be off Eliquis 2/25 platelets 57, continue to monitor 2/28- plts up to 99k- Heme/Onc- said can start Eliquis- will start 2.5 mg BID- will start tomorrow 3/2- plts up to 129k- doing much better  3/6- Plts up to 201k-  11.  Hyperlipidemia.  Lipitor 12. Diabetes- Poor appetite- has Cortrak for intake due to poor appetite-stop insulin- was on at home- levemir 4 units BID when came to rehab, but dropping CBGs- so will hold- will do CBGs BID Will assess if still needs- d/c insulin due to Tfs  2/11- TF's restarted so CBGs going up- 486 this AM- restarted Levemir 4 units BID and SSI- will titrate as required.    CBG (last 3)  Recent Labs    12/31/21 0002 12/31/21 0356 12/31/21 0742  GLUCAP 196* 77 123*  Increased Levimir to 10U q12h  Changed to moderate SSI q 4h since oral intake is low   Levemir at 14 units BID  2/22- Cbgs still really elevated in 200s-300s- will add Novolog coverage per Dm coordinator 3 units at 8pm, midnight and 4am-   2/23- DM coordinator wants pt to increase coverage to 6 units with TF's.   2/27- CBGs medium to high 100s- low 200s- con't regimen for now  3/6- CBGs doing better- 77-196- still labile, but better 13. New onset confusion/poor memory- anoxia? From prolonged CPR  2/9- will check CT of head- and stop Gabapentin for nerve pain due to increased sedation- had before but so out of it per daughter- much worse last 2 days.   2/15- variable -tangential and  poor memory- will need to d/w daughter  2/21- still confused, but acutely medically ill  still- is improving  2/24- poor memory due to prolonged CPR initially, however is doing better- 14. ABLA with chronic anemia  2/13 hb stable at 8.2  2/21- Hb  stable at 8.0  2/25-hemoglobin 7.4, continue to monitor 15. Scab on back of head-pressure ulcer unstageable/Blister on sternum   2/24- scab unstageable on back of head and blister almost healed on L breast.  16. HTN with hx of VF arrest  2/10- pt's BP 178/95- running 458K/998P- systolic- will d/w renal since not on any BP meds-   2/11- Started norvasc and BP looks better 382 systolic- will con't to monitor trend.  Vitals:   12/30/21 1928 12/31/21 0358  BP: 129/64 121/66  Pulse: 91 96  Resp: 20 20  Temp: 98.1 F (36.7 C) 97.8 F (36.6 C)  SpO2: 100% 100%   3/4- BP controlled- con't regimen 17. Constipation  2/24- LBM last night- con't to monitor 18. Leukocytosis-UTI/Bacteremia- Klebsiella and E Fecalis   2/15- will remove foley and bladder scan; also check U/A and Cx- foley was per renal .   2/17- pt has UTI- started on Cefepime yesterday- also WBC up to 17k  2/18 WBC down to 13.2K , recheck in am   2/20- WBC up to 18.3k- due to HD catheter and no improvement- actually worse, d/w ID and pharmacy- agreed to change cefepime to rocephin- also start Vanc and check blood Cx's.   2/21- changed to Ampicillin with ID discussion- off Vanc due to E fecalis   2/22- WBC still 16.4- will check daily fo rnow  2/23- WBC 14.2- coming down  2/24- WBC 14.5- Neutrophils 70% so not a left shift- stable-   2/25-WBC 12.8, improving  2/27- labs in AM- cannot get TEE inpt due to thrombocytopenia per Cards- still on Ampicillin-  2/28- WBC 13.4- down from 14.9- con't regimen- will check ESR and CRP- will check another set of blood Cx's   3/1- ESR 79 and CRP 0.8 3/2- WBC 11.9- doing better  3/6- WBC 11.8- stable- don't see another cause of elevated WBC.  19. Fall  2/16- pt on floor last night- denied pain- CT head was ordered - pt refused to keep on mask to go downstairs- will get this AM.  3/1- fell last night- no injuries  3/2- moved closer to desk 20. Poor appetite-  2/17- will get PEG placed next  week so can remove Cortrak- spoke with PA to get ordered  2/20- order in, but cannot do when ill/sick/  2/21- wait on PEG- con't cortrak  2/22- pt eating entire banana this AM- which is new  2/27- con't Cortrak/TF's at night-  2/28- placed nutrition consult- to see if can not do PEG/ can stop TFs' ? Wait to remove Cortrak. Pt doesn't want PEG_ will d/ PEG order for now- and see if pt can eat more.   3/1- calorie count in progress  3/4 intake 10-30%, discussion re: PEG per Dr. Dagoberto Ligas and family/pt.   3/6- will d/w daughter- really either needs PEG or agree she will dwindle.  21. Anxiety  2/27- appears worse this AM- will ask nursing to give AM anxiety meds  3/1- increased Paxil to 40 mg nightly 22. Mass on liver?  2/28- needs outpt w/u with Heme/Onc.  23. Dispo  2/28- cannot get Medical POA so interim guardianship- [name] 24. hyperkalemia  2/28- will start [place]- put in by Nephrology  3/1- K+ stilll 5.4- will stop bananas and OJ in tray- and con't [place]- recheck labs  in AM  3/3- will recheck labs Monday -wrote for no bananas.   3/6- K+ 4.8-    I spent a total of  36  minutes on total care today- >50% coordination of care- due to complex medical decision making; d/w nursing and NT.     *Per ID note, will need IV ABX til 3/8- unless leaves, then can switch to renally dose Amoxicillin  Get 2 sets of blood C'x-s 1 week after end of ABX- ~ 3/15 for surveillance.   LOS: 27 days A FACE TO FACE EVALUATION WAS PERFORMED  Hulbert Branscome 12/31/2021, 8:22 AM

## 2021-12-31 NOTE — Progress Notes (Signed)
Hypoglycemic Event ? ?CBG: 62, 73, 112 ? ?Treatment: 8 oz juice/soda ? ?Symptoms: None ? ?Follow-up CBG: Time: 1705, 1723, 1741  ?CBG Result:62, 73, 112 ? ?Possible Reasons for Event: Unknown ? ? ? ? ? ?Marlowe Kays, LPN ? ? ?

## 2021-12-31 NOTE — Progress Notes (Signed)
Physical Therapy Session Note ? ?Patient Details  ?Name: [name] ?MRN: [id number] ?Date of Birth: 1951/02/21 ? ?Today's Date: 12/31/2021 ?PT Individual Time: 0539-7673 ?PT Individual Time Calculation (min): 75 min  ? ?Short Term Goals: ?Week 4:  PT Short Term Goal 1 (Week 4): =LTG due to ELOS ? ?Skilled Therapeutic Interventions/Progress Updates:  ?  Pt received sidelying in bed, agreeable to PT session. Pt reports pain in her R ankle and R calf during session. R calf appears tender to the touch with palpable knot. Attempted to perform stretching to R calf for pain management. Supine to sit with Supervision. Sit to stand with mod A to RW due to posterior lean and not following cues for safe UE placement. Stand pivot transfer bed to w/c with RW and min A. Manual w/c propulsoin x 25 ft with use of BUE at min A level with very slow pace and pt becoming easily distracted by environment. Dependent transport via w/c remainder of distance to gym for time conservation. Sit to stand with min A to RW. Ambulation 2 x 20 ft with RW and min A for balance with close w/c follow for safety. Pt again becomes impulsive when sitting down. Pt reports urgent need to have a BM. Toilet transfer with RW and min A to elevated BSC over toilet. Pt with some bowel incontinence in her underwear due to urgency, able to further void while seated on toilet. Pt is setup A for pericare, some assist needed for thoroughness. Pt requires assist to don brief and pull pants back up over hips. Transfer back to w/c with RW and min A. Pt requests to return to bed due to fatigue. Stand pivot transfer w/c to bed with RW and min A. Sit to supine at Supervision level. Pt left seated in bed with needs in reach, bed alarm in place at end of session. Pt also experiences some hallucinations during session including seeing a bug in therapist's hair and seeing a man sitting in corner of gym. However, pt aware that man is not actually there. ? ?Therapy  Documentation ?Precautions:  ?Precautions ?Precautions: Fall, Other (comment) ?Precaution Comments: cortrak; right leg discomfort - PTA ?Restrictions ?Weight Bearing Restrictions: No ? ? ? ? ?Therapy/Group: Individual Therapy ? ? ?Excell Seltzer, PT, DPT, CSRS ? ?12/31/2021, 4:59 PM  ?

## 2021-12-31 NOTE — Progress Notes (Signed)
Occupational Therapy Session Note ? ?Patient Details  ?Name: [name] ?MRN: [id number] ?Date of Birth: 1951/02/26 ? ?Today's Date: 12/31/2021 ?OT Individual Time: [phone] ?OT Individual Time Calculation (min): 43 min  ? ? ?Short Term Goals: ?Week 4:  OT Short Term Goal 1 (Week 4): STG=LTG 2/2 ELOS ? ?Skilled Therapeutic Interventions/Progress Updates:  ?Pt greeted supine in bed  agreeable to OT intervention. Session focus on BADL reeducation, functional mobility, dynamic standing balance and decreasing overall caregiver burden. Pt completed supine>sit with CGA. mOD A sit>stand from EOB with MAX multimodal cues for hand placement as pt wanting to pull on RW. Pt able to pivot to w/c with MIN A. Pt transported to drawers with total A where pt picked out her clothes. Pt easily distracted by extraneous stimuli needing OTA to eliminate auditory distractions and only give pt 2 choices to pick from. Pt completed UB dressing with MIN A needing MOD verbal cues to orient pts awareness to her NG tube. MOD A for LB dressing with pt needing assist for sequencing ( pt trying to don new brief over old brief) and MODA to pull pants up to waist line in standing with MOD A to sit>stand.  Pt transported to sink to brush her hair with supervision. Pt completed stand pivot back to bed with MIN- MODA. CGA for sit>supine. pt left supine in bed with bed alarm activated and all needs within reach.                     ? ? ?Therapy Documentation ?Precautions:  ?Precautions ?Precautions: Fall, Other (comment) ?Precaution Comments: cortrak; right leg discomfort - PTA ?Restrictions ?Weight Bearing Restrictions: No ? ?Pain: no pain reported during session  ? ? ? ?Therapy/Group: Individual Therapy ? ?[name] ?12/31/2021, 9:46 AM ?

## 2021-12-31 NOTE — Progress Notes (Signed)
Speech Language Pathology Daily Session Note ? ?Patient Details  ?Name: [name] ?MRN: [id number] ?Date of Birth: 1951/03/05 ? ?Today's Date: 12/31/2021 ?SLP Missed Time: 45 Minutes ?Missed Time Reason: Patient unwilling to participate;Patient fatigue ? ?Short Term Goals: ?Week 4: SLP Short Term Goal 1 (Week 4): STG=LTG due to short ELOS ? ?Skilled Therapeutic Interventions: Pt was asleep upon entering and requested to not participate in ST services due to fatigue. SLP provided education pertaining to missed services resulting in ending of ST services. Pt promised to participate with ST tomorrow. Pt missed 45 minutes of treatment due to fatigue and refusal. Recommend to continue ST services. ? ?   ? ?Pain ?  ? ?Therapy/Group: Individual Therapy ? ?[name] ?12/31/2021, 2:36 PM ?

## 2022-01-01 LAB — GLUCOSE, CAPILLARY
Glucose-Capillary: 112 mg/dL — ABNORMAL HIGH (ref 70–99)
Glucose-Capillary: 121 mg/dL — ABNORMAL HIGH (ref 70–99)
Glucose-Capillary: 126 mg/dL — ABNORMAL HIGH (ref 70–99)
Glucose-Capillary: 136 mg/dL — ABNORMAL HIGH (ref 70–99)
Glucose-Capillary: 202 mg/dL — ABNORMAL HIGH (ref 70–99)
Glucose-Capillary: 211 mg/dL — ABNORMAL HIGH (ref 70–99)
Glucose-Capillary: 253 mg/dL — ABNORMAL HIGH (ref 70–99)
Glucose-Capillary: 66 mg/dL — ABNORMAL LOW (ref 70–99)
Glucose-Capillary: 73 mg/dL (ref 70–99)

## 2022-01-01 LAB — LACTATE DEHYDROGENASE: LDH: 226 U/L — ABNORMAL HIGH (ref 98–192)

## 2022-01-01 MED ORDER — BENZOCAINE 10 % MT GEL
Freq: Three times a day (TID) | OROMUCOSAL | Status: DC
  Filled 2022-01-01: qty 9

## 2022-01-01 NOTE — Progress Notes (Signed)
PROGRESS NOTE   Subjective/Complaints:  Pt reports still doesn't want PEG- daughter agrees she doesn't either- [name].   Pt says "little better today".  Per daughter, roof of mouth sore and raw- no thrush- lost dentures in ER and cannot get until f/u with dentist.   LBM overnight.  Loves bananas.  Says nose is irritated- they just cleaned cortrak.    ROS: limited due to cognition/behavior  Objective:   No results found. Recent Labs    12/31/21 0334  WBC 11.8*  HGB 7.5*  HCT 23.0*  PLT 201   Recent Labs    12/30/21 0224 12/31/21 0334  NA 138 138  K 4.6 4.8  CL 107 106  CO2 21* 22  GLUCOSE 129* 86  BUN 44* 43*  CREATININE 3.02* 3.02*  CALCIUM 9.4 9.1    Intake/Output Summary (Last 24 hours) at 01/01/2022 0906 Last data filed at 01/01/2022 0700 Gross per 24 hour  Intake 455 ml  Output --  Net 455 ml         Physical Exam: Vital Signs Blood pressure 138/69, pulse 96, temperature 98.9 F (37.2 C), resp. rate 20, height 5' 5"  (1.651 m), weight 67.2 kg, SpO2 100 %.       General: awake, alert, appropriate, sitting up ;Nurse in room; NAD HENT: conjugate gaze; oropharynx dry and lips chapped/peeling; cortrak in place CV: regular rhythm; borderline tachycardic rate; no JVD Pulmonary: CTA B/L; no W/R/R- good air movement GI: soft, NT, ND, (+)BS Psychiatric: appropriate- still anxious Neurological: sitting up slightly in bed; more alert;  Ext: no clubbing, cyanosis, or edema Psych: pleasant and cooperative, sl anxious Neuro: alert, confused, decreased insight and awareness but able to follow basic commands and participate in conversation Bilateral lower extremities: LE 3/5 HF/KE R foot drop- at rest in PF Skin: unstageable wound with scabbing on back of head 3-4cm in diameter- also has popped/dry blister on L sternum- slough gone from base- -no change on head wound today- large scab/thick on back of  head  Assessment/Plan: 1. Functional deficits which require 3+ hours per day of interdisciplinary therapy in a comprehensive inpatient rehab setting. Physiatrist is providing close team supervision and 24 hour management of active medical problems listed below. Physiatrist and rehab team continue to assess barriers to discharge/monitor patient progress toward functional and medical goals  Care Tool:  Bathing    Body parts bathed by patient: Right arm, Left arm, Chest, Abdomen, Front perineal area, Buttocks, Right upper leg, Left upper leg, Face   Body parts bathed by helper: Front perineal area, Buttocks, Right lower leg, Left lower leg     Bathing assist Assist Level: Minimal Assistance - Patient > 75%     Upper Body Dressing/Undressing Upper body dressing   What is the patient wearing?: Pull over shirt    Upper body assist Assist Level: Minimal Assistance - Patient > 75%    Lower Body Dressing/Undressing Lower body dressing      What is the patient wearing?: Pants, Underwear/pull up     Lower body assist Assist for lower body dressing: Moderate Assistance - Patient 50 - 74%     Toileting Toileting Toileting Activity did  not occur Landscape architect and hygiene only): N/A (no void or bm)  Toileting assist Assist for toileting: Moderate Assistance - Patient 50 - 74%     Transfers Chair/bed transfer  Transfers assist     Chair/bed transfer assist level: Minimal Assistance - Patient > 75%     Locomotion Ambulation   Ambulation assist   Ambulation activity did not occur: Safety/medical concerns  Assist level: Minimal Assistance - Patient > 75% Assistive device: Walker-rolling Max distance: 20'   Walk 10 feet activity   Assist  Walk 10 feet activity did not occur: Safety/medical concerns  Assist level: Minimal Assistance - Patient > 75% Assistive device: Walker-rolling   Walk 50 feet activity   Assist Walk 50 feet with 2 turns activity did not  occur: Safety/medical concerns         Walk 150 feet activity   Assist Walk 150 feet activity did not occur: Safety/medical concerns         Walk 10 feet on uneven surface  activity   Assist Walk 10 feet on uneven surfaces activity did not occur: Safety/medical concerns         Wheelchair     Assist Is the patient using a wheelchair?: Yes Type of Wheelchair: Manual    Wheelchair assist level: Supervision/Verbal cueing Max wheelchair distance: 62'    Wheelchair 50 feet with 2 turns activity    Assist        Assist Level: Supervision/Verbal cueing   Wheelchair 150 feet activity     Assist      Assist Level: Dependent - Patient 0%   Blood pressure 138/69, pulse 96, temperature 98.9 F (37.2 C), resp. rate 20, height 5' 5"  (1.651 m), weight 67.2 kg, SpO2 100 %.  Medical Problem List and Plan: 1. Functional deficits secondary to debility related to VF cardiac arrest secondary to massive pulmonary emboli status post mechanical thrombectomy            D/C SNF once medically appropriate Will d/w SW that pt will be done with IV ABX on 3/8- need to figure out the nutrition aspect- will d/w daughter again- only eating 30-40% of total calories/day-  3/7- team conference today- last day can keep pt is til 3/9, due to medical issues since not making much progress- and then need to look for SNF- -con't CIR- PT, OT and SLP  2.  Antithrombotics: -DVT/anticoagulation:  Pharmaceutical: Other (comment) Eliquis- has to use due to saddle PE 2/23- stopped due to plts by Heme/Onc  3/1- restarted Eliquis at 2.5 mg BID  3/6- changed to 5 mg BID since plts back up.              -antiplatelet therapy: N/A 3. Pain Management: Neurontin 100 mg twice daily, oxycodone as needed  Stopped gabapentin due ot sedation  3/2- pain controlled when takes pain meds 4. Mood anxiety-  Cymbalta 20 mg daily, Xanax 0.25 mg daily as needed provide emotional support  2/21- change to  Paxil 20 mg nightly- 2/24- no side effects noted per pt- flat, but looks less anxious  3/1- increased Paxil to 40 mg nightly for continued anxiety             -antipsychotic agents: N/A 5. Neuropsych: This patient is not capable of making decisions on her own behalf. 6. Skin/Wound Care: Routine skin checks 7. Fluids/Electrolytes/Nutrition: still not eating much  -continue TF for now.   -hopefully po intake will pick up as mentation improves.  3/7- pt and [name] doesn't want PEG- trying to avoid- will d/c TF's for now and see if that makes pt hungrier.  8.  Pericasular hepatic /hemorrhagic shock/acute blood loss anemia/status post left liver lobe embolization  Hgb down again to 7.7 repeat non contrast CT abd no change in pericapsular hepatic bleed , check stool OB  2/24- Hb 8.0- will con't to monitor  2/28- Hb down slightly to 7.5  3/1- will recheck in AM  3/2- Hb up to 7.8  3/6- Hb 7.5 again- slowly trending down  3/7- recheck Thursday 9.  AKI secondary to ATN.  Follow-up per renal services.  Started clip for AKI- not ESRD, however Cr almost 5 today- getting HD tomorrow 2/8.   2/9- last Cr 5.74- up from 4.97 up from 4.45- rising- per renal- likely HD tomorrow?.  2/24- HD catheter removed due to bacteremia- renal following for possible more HD Appreciate nephro recs 3/3- no more HD per renal- still has Cr ~ 3.2- likely this is new baseline 3/6- Cr down to 3.02- stable.  10.  Thrombocytopenia.  Suspect ITP.  Follow-up per hematology 2/21- Plts were 214- down to 17k- will transfuse and cal Heme/Onc- 2/22- plts up to 27k- this Am after transfusion- Heme/Onc not sure why drop in plts- however said no transfusion unless plts drop<10k  2.23- plts 33k this AM- doing better 2/24- Plts 46k- off Eliquis per Heme/Onc- had saddle PE- but risks/benefits require her ot be off Eliquis 2/25 platelets 57, continue to monitor 2/28- plts up to 99k- Heme/Onc- said can start Eliquis- will start 2.5 mg BID-  will start tomorrow 3/2- plts up to 129k- doing much better  3/6- Plts up to 201k-  11.  Hyperlipidemia.  Lipitor 12. Diabetes- Poor appetite- has Cortrak for intake due to poor appetite-stop insulin- was on at home- levemir 4 units BID when came to rehab, but dropping CBGs- so will hold- will do CBGs BID Will assess if still needs- d/c insulin due to Tfs  2/11- TF's restarted so CBGs going up- 486 this AM- restarted Levemir 4 units BID and SSI- will titrate as required.    CBG (last 3)  Recent Labs    01/01/22 0006 01/01/22 0358 01/01/22 0754  GLUCAP 253* 121* 211*  Increased Levimir to 10U q12h  Changed to moderate SSI q 4h since oral intake is low   Levemir at 14 units BID  2/22- Cbgs still really elevated in 200s-300s- will add Novolog coverage per Dm coordinator 3 units at 8pm, midnight and 4am-   2/23- DM coordinator wants pt to increase coverage to 6 units with TF's.   2/27- CBGs medium to high 100s- low 200s- con't regimen for now  3/6- CBGs doing better- 77-196- still labile, but better  3/7- stopping TF's- wont make changes yet- except d/c insulin with TF's at night 13. New onset confusion/poor memory- anoxia? From prolonged CPR  2/9- will check CT of head- and stop Gabapentin for nerve pain due to increased sedation- had before but so out of it per daughter- much worse last 2 days.   2/15- variable -tangential and  poor memory- will need to d/w daughter  2/21- still confused, but acutely medically ill still- is improving  2/24- poor memory due to prolonged CPR initially, however is doing better- 14. ABLA with chronic anemia  2/13 hb stable at 8.2  2/21- Hb stable at 8.0  2/25-hemoglobin 7.4, continue to monitor 15. Scab on back of head-pressure ulcer unstageable/Blister on sternum  2/24- scab unstageable on back of head and blister almost healed on L breast.  16. HTN with hx of VF arrest  2/10- pt's BP 178/95- running [id number]/466Z- systolic- will d/w renal since not on any  BP meds-   2/11- Started norvasc and BP looks better 993 systolic- will con't to monitor trend.  Vitals:   12/31/21 1938 01/01/22 0356  BP: (!) 160/77 138/69  Pulse: (!) 104 96  Resp: 20 20  Temp: 98 F (36.7 C) 98.9 F (37.2 C)  SpO2: 100% 100%   3/7- BP usually controlled- con't to monitor for trend 17. Constipation  2/24- LBM last night- con't to monitor 18. Leukocytosis-UTI/Bacteremia- Klebsiella and E Fecalis   2/15- will remove foley and bladder scan; also check U/A and Cx- foley was per renal .   2/17- pt has UTI- started on Cefepime yesterday- also WBC up to 17k  2/18 WBC down to 13.2K , recheck in am   2/20- WBC up to 18.3k- due to HD catheter and no improvement- actually worse, d/w ID and pharmacy- agreed to change cefepime to rocephin- also start Vanc and check blood Cx's.   2/21- changed to Ampicillin with ID discussion- off Vanc due to E fecalis   2/22- WBC still 16.4- will check daily fo rnow  2/23- WBC 14.2- coming down  2/24- WBC 14.5- Neutrophils 70% so not a left shift- stable-   2/25-WBC 12.8, improving  2/27- labs in AM- cannot get TEE inpt due to thrombocytopenia per Cards- still on Ampicillin-  2/28- WBC 13.4- down from 14.9- con't regimen- will check ESR and CRP- will check another set of blood Cx's   3/1- ESR 79 and CRP 0.8 3/2- WBC 11.9- doing better  3/6- WBC 11.8- stable- don't see another cause of elevated WBC.  19. Fall  2/16- pt on floor last night- denied pain- CT head was ordered - pt refused to keep on mask to go downstairs- will get this AM.  3/1- fell last night- no injuries  3/2- moved closer to desk 20. Poor appetite-  2/17- will get PEG placed next week so can remove Cortrak- spoke with PA to get ordered  2/20- order in, but cannot do when ill/sick/  2/21- wait on PEG- con't cortrak  2/22- pt eating entire banana this AM- which is new  2/27- con't Cortrak/TF's at night-  2/28- placed nutrition consult- to see if can not do PEG/ can stop  TFs' ? Wait to remove Cortrak. Pt doesn't want PEG_ will d/ PEG order for now- and see if pt can eat more.   3/1- calorie count in progress  3/4 intake 10-30%, discussion re: PEG per Dr. Dagoberto Ligas and family/pt.   3/6- will d/w daughter- really either needs PEG or agree she will dwindle.   3/7- daughter/pt don't want PEG- will stop TF's and see if she eats more- daughter to bring in food from home- will add Ambesol for roof of mouth soreness with meals 21. Anxiety  2/27- appears worse this AM- will ask nursing to give AM anxiety meds  3/1- increased Paxil to 40 mg nightly 22. Mass on liver?  2/28- needs outpt w/u with Heme/Onc.  23. Dispo  2/28- cannot get Medical POA so interim guardianship- [name] 24. hyperkalemia  2/28- will start Andrews- put in by Nephrology  3/1- K+ stilll 5.4- will stop bananas and OJ in tray- and con't Lokelma- recheck labs in AM  3/3- will recheck labs Monday -wrote for no bananas.  3/6- K+ 4.8-   3/7- explained to daughter that bananas cause her potassium to go up    I spent a total of 51   minutes on total care today- >50% coordination of care- due to team conference and calling daughter to discuss plan- daughter and pt don't want her to get PEG- so will try without TF's for next few days and daughter will bring in food from home- explained this cannot hold up her d/c to SNF     *Per ID note, will need IV ABX til 3/8- unless leaves, then can switch to renally dose Amoxicillin  Get 2 sets of blood C'x-s 1 week after end of ABX- ~ 3/15 for surveillance.   LOS: 28 days A FACE TO FACE EVALUATION WAS PERFORMED  Zakiya Sporrer 01/01/2022, 9:06 AM

## 2022-01-01 NOTE — Progress Notes (Signed)
Occupational Therapy Note ? ?Patient Details  ?Name: [name] ?MRN: [id number] ?Date of Birth: [birth year] ? ?Today's Date: 01/01/2022 ?OT Missed Time: 30 Minutes ?Missed Time Reason: Patient fatigue;Patient unwilling/refused to participate without medical reason ? ?Pt sleeping in recliner upon arrival. Max verbal cues for arousal and pt unwilling to participate in therapy. Pt remained in recliner with belt alarm activated. Pt missed 30 mins skilled OT services. Will check back as available.  ? ?Leroy Libman ?01/01/2022, 11:54 AM ?

## 2022-01-01 NOTE — Progress Notes (Signed)
Occupational Therapy Session Note ? ?Patient Details  ?Name: [name] ?MRN: [id number] ?Date of Birth: [birth year] ? ?Today's Date: 01/01/2022 ?OT Individual Time: 2248-2500 ?OT Individual Time Calculation (min): 53 min  ? ? ?Short Term Goals: ?Week 4:  OT Short Term Goal 1 (Week 4): STG=LTG 2/2 ELOS ? ?Skilled Therapeutic Interventions/Progress Updates:  ?Pt greeted supine in bed agreeable to OT intervention. Session focus on functional mobility, attention to task and BUE/BLE therex and decreasing overall caregiver burden. Pt completed supine>sit wth CGA needing increased time for transitions. Pt completed sit>stand with MOD A d/t posterior lean; MOD A to pivot to w/c with Rw d/t posterior lean, additionally pt can become distracted during transfers needing verbal cues to attend to task. Pt transported outside with total A from w/c to increase overall mood and affect. Pt noted to distracted outside asking OTA and tech if we saw "the little boy in the corner."   Oriented pt to surroundings with pt still distracted by nearby people talking etc.    ?Pt did complete below BLE/BUE therex to increase overall strength and endurance for ADL participation:                   ?X10 BUE shoulder flexion/extension level 1 theraband ?X10 BUE bicep curls level 1 theraband  ?X10 BUE uright rows 1 theraband  ?X10 BLEs LAQs 1 theraband  ?Pt transported back inside with total A where pt completed stand pivot to recliner with RW and MODA. Pt left seated in recliner with chair alarm activated and all needs within reach.  ? ?Therapy Documentation ?Precautions:  ?Precautions ?Precautions: Fall, Other (comment) ?Precaution Comments: cortrak; right leg discomfort - PTA ?Restrictions ?Weight Bearing Restrictions: No ? ?Pain: no pain reported during session  ? ? ? ?Therapy/Group: Individual Therapy ? ?[name] ?01/01/2022, 10:58 AM ?

## 2022-01-01 NOTE — Discharge Summary (Signed)
Physician Discharge Summary  Patient ID: [name] MRN: [id number] DOB/AGE: 73-Oct-1952 73 y.o.  Admit date: 12/04/2021 Discharge date: 01/04/2022  Discharge Diagnoses:  Principal Problem:   Debility Active Problems:   Cardiac arrest [facility])   Acute saddle pulmonary embolism with acute cor pulmonale (HCC)   Acute on chronic renal failure (HCC)   Anoxia of brain (HCC)   Essential hypertension Decreased nutritional storage Thrombocytopenia Pressure wound back of head Constipation UTI Fibromyalgia Hyperlipidemia Bacteremia Diabetes mellitus Decreased nutritional storage  Discharged Condition: Stable  Significant Diagnostic Studies: CT ABDOMEN PELVIS WO CONTRAST  Result Date: 12/15/2021 CLINICAL DATA:  Follow-up retroperitoneal hematoma EXAM: CT ABDOMEN AND PELVIS WITHOUT CONTRAST TECHNIQUE: Multidetector CT imaging of the abdomen and pelvis was performed following the standard protocol without IV contrast. RADIATION DOSE REDUCTION: This exam was performed according to the departmental dose-optimization program which includes automated exposure control, adjustment of the mA and/or kV according to patient size and/or use of iterative reconstruction technique. COMPARISON:  MR abdomen, 11/16/2021, CT abdomen pelvis, 10/30/2021 FINDINGS: Lower chest: Bandlike scarring and or atelectasis of the dependent bilateral lung bases, significantly improved compared to prior examination. Hepatobiliary: Redemonstrated large subcapsular lesion of the inferior left lobe of the liver, hepatic segment III, measuring 8.9 x 6.0 cm, not significantly changed. Lesion is now near fluid internal attenuation, HU = 24). No radiopaque gallstones, gallbladder wall thickening, or biliary dilatation. Pancreas: Unremarkable. No pancreatic ductal dilatation or surrounding inflammatory changes. Spleen: Normal in size without significant abnormality. Adrenals/Urinary Tract: Adrenal glands are unremarkable. Kidneys are  normal, without renal calculi, solid lesion, or hydronephrosis. Bladder is unremarkable. Stomach/Bowel: Stomach is within normal limits. Enteric feeding tube with tip and side port below the diaphragm. Appendix appears normal. No evidence of bowel wall thickening, distention, or inflammatory changes. Pancolonic diverticulosis. Vascular/Lymphatic: Aortic atherosclerosis. No enlarged abdominal or pelvic lymph nodes. Reproductive: No mass or other significant abnormality. Other: No abdominal wall hernia or abnormality. No ascites. Musculoskeletal: No acute or significant osseous findings. IMPRESSION: 1. Redemonstrated large subcapsular lesion of the inferior left lobe of the liver, hepatic segment III, measuring 8.9 x 6.0 cm, not significantly changed. Lesion is now near fluid internal attenuation, generally consistent with a resolving hematoma and previously embolized. As on prior examination, underlying liver lesion, such as hemangioma or malignancy complicated by hemorrhage is not excluded, particularly by this noncontrast examination. 2. No evidence of retroperitoneal hematoma per ordering indication. 3. Bandlike scarring and or atelectasis of the dependent bilateral lung bases, significantly improved compared to prior examination. 4. Pancolonic diverticulosis without evidence of acute diverticulitis. Aortic Atherosclerosis (ICD10-I70.0). Electronically Signed   By: Delanna Ahmadi M.D.   On: 12/15/2021 11:40   CT HEAD WO CONTRAST (5MM)  Result Date: 12/14/2021 CLINICAL DATA:  Altered mental status EXAM: CT HEAD WITHOUT CONTRAST TECHNIQUE: Contiguous axial images were obtained from the base of the skull through the vertex without intravenous contrast. RADIATION DOSE REDUCTION: This exam was performed according to the departmental dose-optimization program which includes automated exposure control, adjustment of the mA and/or kV according to patient size and/or use of iterative reconstruction technique.  COMPARISON:  12/06/2021 FINDINGS: Brain: The brainstem, cerebellum, cerebral peduncles, thalami, basal ganglia, basilar cisterns, and ventricular system appear within normal limits. No intracranial hemorrhage, mass lesion, or acute CVA. Vascular: There is atherosclerotic calcification of the cavernous carotid arteries bilaterally. Skull: Unremarkable Sinuses/Orbits: Unremarkable Other: Stable cutaneous thickening and subcutaneous stranding along the left posterior occipitoparietal scalp, present on 12/06/2021 but not on 11/08/2021, probably a  scalp hematoma or contusion. IMPRESSION: 1. No acute intracranial findings. 2. Stable focal scalp lesion along the left posterior occipitoparietal region, possibly a scalp hematoma or contusion. 3. Atherosclerosis. Electronically Signed   By: Van Clines M.D.   On: 12/14/2021 13:07   IR Removal Tun Cv Cath W/O FL  Result Date: 12/20/2021 INDICATION: Bacteremia in patient with tunnel hemodialysis catheter in place. Request for tunneled dialysis catheter removal. EXAM: REMOVAL OF TUNNELED HEMODIALYSIS CATHETER MEDICATIONS: 7 mL 1 % lidocaine COMPLICATIONS: None immediate. PROCEDURE: Informed written consent was obtained from the patient following an explanation of the procedure, risks, benefits and alternatives to treatment. A time out was performed prior to the initiation of the procedure. Sterile technique was utilized including mask, sterile gloves, sterile drape, and hand hygiene. Hibiclens solution was used to prep the patient's right neck, chest and existing catheter. 1% lidocaine was injected around the catheter and the subcutaneous tunnel. The catheter was dissected out using scissors and curved hemostats until the cuff was freed from the surrounding fibrous sheath. The catheter was removed intact. Hemostasis was obtained with manual compression. A dressing was placed. The patient tolerated the procedure well without immediate post procedural complication.  IMPRESSION: Successful removal of tunneled dialysis catheter. Read by: Durenda Guthrie, PA-C Electronically Signed   By: Corrie Mckusick D.O.   On: 12/20/2021 13:51   DG Abd Portable 1V  Result Date: 12/12/2021 CLINICAL DATA:  Feeding tube placement EXAM: PORTABLE ABDOMEN - 1 VIEW COMPARISON:  None. FINDINGS: Feeding tube in the mid to distal body of the stomach. Normal bowel gas pattern.  No abnormal calcifications. IMPRESSION: Feeding tube tip in the mid distal body of the stomach. Electronically Signed   By: Franchot Gallo M.D.   On: 12/12/2021 11:49   ECHOCARDIOGRAM COMPLETE  Result Date: 12/18/2021    ECHOCARDIOGRAM REPORT   Patient Name:   FLORETTE THAI Date of Exam: 12/18/2021 Medical Rec #:  [id number]       Height:       65.0 in Accession #:    [id number]      Weight:       142.0 lb Date of Birth:  October 10, 1951       BSA:          1.710 m Patient Age:    20 years        BP:           123/67 mmHg Patient Gender: F               HR:           97 bpm. Exam Location:  Inpatient Procedure: 2D Echo, Cardiac Doppler and Color Doppler Indications:    R78.87 BACTEREMIA  History:        Patient has prior history of Echocardiogram examinations, most                 recent 10/28/2021. Risk Factors:Hypertension, Diabetes and                 Dyslipidemia. CARDIAC ARREST 10/28/21.  Sonographer:    Beryle Beams Referring Phys: [id number] [place]  1. Left ventricular ejection fraction, by estimation, is 60 to 65%. The left ventricle has normal function. The left ventricle has no regional wall motion abnormalities. There is mild concentric left ventricular hypertrophy. Left ventricular diastolic parameters are consistent with Grade I diastolic dysfunction (impaired relaxation).  2. Right ventricular systolic function is normal. The right ventricular size is normal.  3. The mitral valve is normal in structure. No evidence of mitral valve regurgitation. No evidence of mitral stenosis.  4. The aortic valve is  normal in structure. Aortic valve regurgitation is not visualized. No aortic stenosis is present.  5. The inferior vena cava is normal in size with greater than 50% respiratory variability, suggesting right atrial pressure of 3 mmHg. Comparison(s): Prior images reviewed side by side. The right ventricular systolic function has improved. Conclusion(s)/Recommendation(s): No evidence of valvular vegetations on this transthoracic echocardiogram. Consider a transesophageal echocardiogram to exclude infective endocarditis if clinically indicated. FINDINGS  Left Ventricle: Left ventricular ejection fraction, by estimation, is 60 to 65%. The left ventricle has normal function. The left ventricle has no regional wall motion abnormalities. The left ventricular internal cavity size was normal in size. There is  mild concentric left ventricular hypertrophy. Left ventricular diastolic parameters are consistent with Grade I diastolic dysfunction (impaired relaxation). Right Ventricle: The right ventricular size is normal. No increase in right ventricular wall thickness. Right ventricular systolic function is normal. Left Atrium: Left atrial size was normal in size. Right Atrium: Right atrial size was normal in size. Pericardium: There is no evidence of pericardial effusion. Mitral Valve: The mitral valve is normal in structure. No evidence of mitral valve regurgitation. No evidence of mitral valve stenosis. Tricuspid Valve: The tricuspid valve is normal in structure. Tricuspid valve regurgitation is trivial. No evidence of tricuspid stenosis. Aortic Valve: The aortic valve is normal in structure. Aortic valve regurgitation is not visualized. No aortic stenosis is present. Aortic valve mean gradient measures 6.0 mmHg. Aortic valve peak gradient measures 11.0 mmHg. Aortic valve area, by VTI measures 2.56 cm. Pulmonic Valve: The pulmonic valve was normal in structure. Pulmonic valve regurgitation is not visualized. No evidence of  pulmonic stenosis. Aorta: The aortic root is normal in size and structure. Venous: The inferior vena cava is normal in size with greater than 50% respiratory variability, suggesting right atrial pressure of 3 mmHg. IAS/Shunts: No atrial level shunt detected by color flow Doppler.  LEFT VENTRICLE PLAX 2D LVIDd:         3.80 cm LVIDs:         2.20 cm LV PW:         1.30 cm LV IVS:        1.20 cm LVOT diam:     1.80 cm LV SV:         75 LV SV Index:   44 LVOT Area:     2.54 cm  LV Volumes (MOD) LV vol d, MOD A2C: 66.7 ml LV vol d, MOD A4C: 52.3 ml LV vol s, MOD A2C: 30.6 ml LV vol s, MOD A4C: 6.9 ml LV SV MOD A2C:     36.1 ml LV SV MOD A4C:     52.3 ml LV SV MOD BP:      45.8 ml RIGHT VENTRICLE             IVC RV S prime:     18.40 cm/s  IVC diam: 1.80 cm TAPSE (M-mode): 2.0 cm LEFT ATRIUM           Index        RIGHT ATRIUM           Index LA diam:      3.10 cm 1.81 cm/m   RA Area:     14.80 cm LA Vol (A2C): 27.5 ml 16.08 ml/m  RA Volume:   34.80 ml  20.35 ml/m  AORTIC VALVE                     PULMONIC VALVE AV Area (Vmax):    1.99 cm      PV Vmax:       0.88 m/s AV Area (Vmean):   2.26 cm      PV Vmean:      59.900 cm/s AV Area (VTI):     2.56 cm      PV VTI:        0.170 m AV Vmax:           166.00 cm/s   PV Peak grad:  3.1 mmHg AV Vmean:          109.000 cm/s  PV Mean grad:  2.0 mmHg AV VTI:            0.294 m AV Peak Grad:      11.0 mmHg AV Mean Grad:      6.0 mmHg LVOT Vmax:         130.00 cm/s LVOT Vmean:        96.900 cm/s LVOT VTI:          0.296 m LVOT/AV VTI ratio: 1.01  AORTA Ao Root diam: 2.70 cm Ao Asc diam:  2.90 cm MITRAL VALVE               TRICUSPID VALVE MV Area (PHT): 3.27 cm    TR Peak grad:   19.9 mmHg MV Decel Time: 232 msec    TR Vmax:        223.00 cm/s MV E velocity: 67.80 cm/s MV A velocity: 94.60 cm/s  SHUNTS MV E/A ratio:  0.72        Systemic VTI:  0.30 m                            Systemic Diam: 1.80 cm Dani Gobble Croitoru MD Electronically signed by Sanda Klein MD Signature  Date/Time: 12/18/2021/12:11:20 PM    Final    [name] EKG SITE RITE  Result Date: 12/27/2021 If Site Rite image not attached, placement could not be confirmed due to current cardiac rhythm.   Labs:  Basic Metabolic Panel: Recent Labs  Lab 01/03/22 0511 01/07/22 0526  NA 141 140  K 4.2 3.6  CL 110 110  CO2 20* 21*  GLUCOSE 102* 103*  BUN 40* 33*  CREATININE 2.70* 2.65*  CALCIUM 9.6 9.6    CBC: Recent Labs  Lab 01/03/22 0511 01/07/22 0526  WBC 9.9 8.4  NEUTROABS 6.9 4.9  HGB 7.8* 8.3*  HCT 24.7* 25.2*  MCV 89.2 86.3  PLT 290 355    CBG: Recent Labs  Lab 01/06/22 0639 01/06/22 1133 01/06/22 1629 01/06/22 2102 01/07/22 0607  GLUCAP 93 118* 145* 162* 95   Family history.  Mother with diabetes father with myocardial infarction Sister with diabetes Brother with throat cancer.  Denies any colon cancer esophageal cancer or rectal cancer  Brief HPI:   SHALEAH NISSLEY is a 71 y.o. right-handed female with history of asthma diabetes mellitus fibromyalgia hypertension and hyperlipidemia.  Presented 10/28/2021 after a presyncopal episode at home where her daughter lowered her to the ground.  She regained some consciousness and told her daughter she felt like her blood glucose was low.  She lost consciousness right before EMS arrived.  EMS arrived she was in VF and had 20 minutes of CPR.  She received shock  amiodarone and epinephrine.  She coded twice in the ED requiring intubation for airway protection.  She required frequent pushes of epinephrine for bradycardia and hypotension.  EKG junctional rhythm concerning for STEMI.  Angiogram of the chest showed saddle pulmonary embolus extending to the segmental and subsegmental levels in the left lower lobe in all 3 right lobes.  Associated right heart strain.  Vascular ultrasound lower extremities negative for DVT.  She did require mechanical thrombectomy for pulmonary emboli.  Patient was slowly weaned from pressors.  She was extubated 11/06/2021.   On 11/08/2021 MRI showed a large area of intrahepatic hemorrhage and she did require multiple transfusion underwent embolization 10/31/2021.  Nephrology consulted for renal failure secondary to ATN and hemodialysis initiated awaiting plan for long-term hemodialysis.  Bouts of thrombocytopenia suspect ITP and was getting Nplate prn and Dex.  With latest hemoglobin 7.8 platelet count improved 91,000.  She was cleared to begin Eliquis for history of pulmonary emboli.  Therapy evaluations completed due to patient decreased functional mobility was admitted for a comprehensive rehab program.   Hospital Course: JASKIRAN PATA was admitted to rehab 12/04/2021 for inpatient therapies to consist of PT, ST and OT at least three hours five days a week. Past admission physiatrist, therapy team and rehab RN have worked together to provide customized collaborative inpatient rehab.  Pertaining to patient's debility related to VF cardiac arrest secondary to massive pulmonary emboli status post mechanical thrombectomy.  She had been cleared to resume Eliquis.  She is followed closely by hematology for thrombocytopenia.  No further bleeding episodes.  Mood stabilization with Xanax as well as Paxil.  Pain management with the use of oxycodone as needed as well as scheduled Lidoderm patch.Pericascular hepatic/hemorrhagic shock/acute blood loss anemia status post left liver lobe embolization again monitoring of hemoglobin follow-up outpatient.  AKI secondary to ATN follow-up renal services hemodialysis catheter removed due to bacteremia followed by infectious disease no further need for hemodialysis creatinine stabilized 2.70.  In regards to patient's bacteremia WBC improving 11,800.  TEE not completed due to thrombocytopenia.  Patient is currently maintained on intravenous ampicillin through 01/02/2022 and follow-up with infectious disease within 1 week for repeat blood cultures if needed.  During her hospital stay she did have reported  fall cranial CT scan 12/06/2021 as well as 12/14/2021 unremarkable without new acute intracranial findings.  Patient with decreased nutritional storage she did have extended time with the nasogastric tube and diet slowly advanced with noted decrease appetite calorie counts dietary follow-up discussed at length with patient and family for PEG tube of which patient adamantly refused.  Patient with pressure wound occiput with follow-up wound care nurse 3 cm round scabbed lesion.  The wound was cleansed with peroxide to soften the scab patted dry gently with mupirocin added twice daily and covered with a dry bandage.  Blood sugars monitored closely maintained on insulin therapy as directed with latest hemoglobin A1c of 9.0.   Blood pressures were monitored on TID basis and soft and monitored  Diabetes has been monitored with ac/hs CBG checks and SSI was use prn for tighter BS control.    Rehab course: During patient's stay in rehab weekly team conferences were held to monitor patient's progress, set goals and discuss barriers to discharge. At admission, patient required minimal assist General transfers supervision sit to side-lying minimal assist sit to supine max assist ambulation  Physical exam.  Blood pressure 147/90 pulse 97 temperature 96 respirations 16 oxygen saturation 93% room air Constitutional.  Frail-appearing female HEENT Head.  Normocephalic and atraumatic Eyes.  Pupils round and reactive to light no discharge without nystagmus Neck.  Supple nontender no JVD without thyromegaly Cardiac regular rate rhythm any extra sounds or murmur heard Abdomen.  Soft nontender positive bowel sounds without rebound Respiratory effort normal no respiratory distress without wheeze Musculoskeletal.  Nontender Comments.  Upper extremities 5 -/5 bilaterally Lower extremities 3/5 hip flexors knee extension dorsiflexion 4 -/5 on the left 2 -/5 on the right, PF 4 -/5 bilaterally Neurologic.  Makes eye contact  with examiner follows simple commands.  Decreased sensation from mid calf to toes bilateral lower extremities left greater than right  He/She  has had improvement in activity tolerance, balance, postural control as well as ability to compensate for deficits. He/She has had improvement in functional use RUE/LUE  and RLE/LLE as well as improvement in awareness.  Supine to sit with supervision sit to stand with moderate assist to rolling walker ambulating 20 feet x 2 with minimal assist.  Stand pivot transfer to bed wheelchair rolling walker minimal assist.  She did receive frequent rest breaks.  ADLs sessions focused on functional mobility attention to task.  Completed supine to sit contact-guard for ADLs.  Completed sit to stand moderate assist posterior lean mod assist pivot to wheelchair.  Patient with ongoing therapies and updates provided to the family.  Patient was discharged home       Disposition: Discharge to home    Diet: Carb modified  Special Instructions: No driving smoking or alcohol  Mupiricon twice daily to occiput pressure injury cover with dry bandage  Medications at discharge 1.  Tylenol as needed 2.  Xanax 0.25 mg daily 3.  Norvasc 10 mg p.o. daily 4.  Eliquis 5 mg p.o. twice daily 5.  Lipitor 20 mg p.o. daily 6.  B complex with vitamin C tablet daily 7  Orajel 3 times daily with meals 8.  Tums 1 tablet every 4 hours as needed indigestion 9.  Bentyl 10 mg p.o. 3 times daily before meals 10.  Folic acid 2 mg p.o. daily 11.  Lidoderm patch 2 patches change as directed 12.  Oxycodone 5 mg every 4 hours as needed pain 13.  Protonix 40 mg p.o. twice daily 14.  Paxil 40 mg p.o. nightly 15.  Senokot 2 tablets daily 16.Levemir 11 unit BID 17.  Albuterol inhaler 1 to 2 puffs every 4 hours as needed shortness of breath    Follow-up Information     Lovorn, Jinny Blossom, MD Follow up.   Specialty: Physical Medicine and Rehabilitation Why: No formal follow-up needed Contact  information: [address] [place] [address] [phone]         Volanda Napoleon, MD Follow up.   Specialty: Oncology Why: Call for appointment and for evaluation of liver biopsy Contact information: [address] [place] [address] [phone]         Dwana Melena, MD Follow up.   Specialty: Nephrology Why: Call for appointment Contact information: [place] [phone]         Rosiland Oz, MD Follow up.   Specialty: Infectious Diseases Why: Call for appointment within 1 week Contact information: [address] [place] [address] [phone]                 Signed: Cathlyn Parsons 01/07/2022, 9:31 AM

## 2022-01-01 NOTE — Patient Care Conference (Signed)
Inpatient RehabilitationTeam Conference and Plan of Care Update ?Date: 01/01/2022   Time: 11:07 AM  ? ? ?Patient Name: [name]      ?Medical Record Number: [id number]  ?Date of Birth: 02/24/1951 ?Sex: Female         ?Room/Bed: 2W97L/8X21J-94 ?Payor Info: Payor: HU[employer] MEDICARE / Plan: HU[employer] MEDICARE CHOICE PPO / Product Type: *No Product type* /   ? ?Admit Date/Time:  12/04/2021  6:47 PM ? ?Primary Diagnosis:  Debility ? ?Hospital Problems: Principal Problem: ?  Debility ?Active Problems: ?  Cardiac arrest [facility]) ?  Acute saddle pulmonary embolism with acute cor pulmonale (HCC) ?  Acute on chronic renal failure (HCC) ?  Anoxia of brain ([place]) ?  Essential hypertension ? ? ? ?Expected Discharge Date: Expected Discharge Date: 01/03/22 (SNF) ? ?Team Members Present: ?Physician leading conference: Dr. Courtney Heys ?Social Worker Present: [name], LCSWA ?Nurse Present: Dorthula Nettles, RN ?PT Present: Excell Seltzer, PT ?OT Present: Roanna Epley, Claris Gladden, OT ?SLP Present: Charolett Bumpers, SLP ?PPS Coordinator present : Gunnar Fusi, SLP ? ?   Current Status/Progress Goal Weekly Team Focus  ?Bowel/Bladder ? ? incontinent bladder  regain continence  toilet q 2hr and prn   ?Swallow/Nutrition/ Hydration ? ?           ?ADL's ? ? UB ADLs-min A; LB ADLs-max A; toileting-mod A; transfers-CGA-mod A  mod A overall (downgraded 2/2 lack of progress)  activity tolerance, BADLs, transfers   ?Mobility ? ? Supervision to CGA bed mobility, CGA to mod A to stand and transfer with RW, gait up to 25 ft with RW min A  min A overall, short distance gait goal  endurance, balance, safety, transfers, gait, participation   ?Communication ? ?           ?Safety/Cognition/ Behavioral Observations ? mod-supervision A         ?Pain ? ? reports 8/10 pain  < 3  assess pain q 4hr and prn   ?Skin ? ? eschar to posterior head  no new breakdown  assess skin q shift and prn   ? ? ?Discharge Planning:  ?? D/c to SNF pending until  medically cleared. SW still waiting on preferred SNF location. Once medically cleared for placement, SNF pending insurance approval.   ?Team Discussion: ?Not eating enough. Patient doesn't want PEG tube. Reports pain to roof of mouth. Anbesol ordered to be applied before each meal. Incontinent bladder. Reports 8/10 pain. Tube feeds discontinued, HS insulin discontinued. Completes antibiotics tomorrow. Failure to thrive. Unstageable to posterior head continues to heal. No reason medically to stay past Thursday.  ? ?Patient on target to meet rehab goals: ?Mod assist overall with ADL's, min assist short distance gait goal. Progress fluctuates. Mod assist to CGA. Reports pain to right calf. Min assist upper body, mod/max assist lower body, toileting. Cognition/attention improving. ? ?*See Care Plan and progress notes for long and short-term goals.  ? ?Revisions to Treatment Plan:  ?Adjusting medications ?  ?Teaching Needs: ?Family education, medication/pain management, skin/wound care, safety awareness, transfer/gait training, etc. ?  ?Current Barriers to Discharge: ?Failure to Thrive, discharge location ? ?Possible Resolutions to Barriers: ?Family education ?PEG placement ?SNF location confirmed ?  ? ? Medical Summary ?Current Status: finishing IV ABX tomorrow- inconitinent of bladder- bowel continent; 8/10 in RLE- scab on back of head is stable- ? Barriers to Discharge: Decreased family/caregiver support;Behavior;Home enviroment access/layout;Incontinence;IV antibiotics;Medication compliance;Nutrition means;Wound care ? Barriers to Discharge Comments: SNF vs home- triying to place  if possible once medical issues stable ?Possible Resolutions to [employer]: fluctuations in participation- anxiety big limiter- mod-max A ADLs; same level overall- drank breakfast today- cognition/problem solving slightly improving- will d/c TF's- and wait on PEG per pt request- d/c SNF ? ? ?Continued Need for Acute Rehabilitation  Level of Care: The patient requires daily medical management by a physician with specialized training in physical medicine and rehabilitation for the following reasons: ?Direction of a multidisciplinary physical rehabilitation program to maximize functional independence : Yes ?Medical management of patient stability for increased activity during participation in an intensive rehabilitation regime.: Yes ?Analysis of laboratory values and/or radiology reports with any subsequent need for medication adjustment and/or medical intervention. : Yes ? ? ?I attest that I was present, lead the team conference, and concur with the assessment and plan of the team. ? ? ?Dorthula Nettles G ?01/01/2022, 4:08 PM  ? ? ? ? ? ? ?

## 2022-01-01 NOTE — Progress Notes (Signed)
Physical Therapy Session Note ? ?Patient Details  ?Name: [name] ?MRN: [id number] ?Date of Birth: 1950/11/01 ? ?Today's Date: 01/01/2022 ?PT Individual Time: 8466-5993 ?PT Individual Time Calculation (min): 40 min  ? ?Short Term Goals: ?Week 4:  PT Short Term Goal 1 (Week 4): =LTG due to ELOS ? ?Skilled Therapeutic Interventions/Progress Updates:  ?  Pt received sidelying in bed, agreeable with increased time and encouragement to participate in therapy session. Supine to sit with min A needed for trunk elevation. Sit to stand with min A to RW with posterior lean, cues for anterior weight shift. Pt then reports urge to use the bathroom, agreeable to ambulate into bathroom with RW. Pt initially min A for gait, increases to mod A with onset of anxiety and fatigue. Pt leans back against door frame at one point to take a break. Pt becomes very anxious and requires max cueing to safely turn to sit on elevated BSC over toilet. Pt unable to remove pants prior to sitting due to anxiety. Pt requires seated rest break and emotional support to recover from anxiety, then agreeable to stand to RW to pull down pants. Pt is min to mod A to stand from [facility] to RW due to decreased safety awareness and ability to follow cues. Pt able to continently void once seated on toilet, some assist needed for clothing management. Stand pivot transfer back to w/c with RW and min A then back to bed in similar manner. Sit to supine Supervision. Pt left seated in bed with needs in reach, bed alarm in place at end of session. ? ?Therapy Documentation ?Precautions:  ?Precautions ?Precautions: Fall, Other (comment) ?Precaution Comments: cortrak; right leg discomfort - PTA ?Restrictions ?Weight Bearing Restrictions: No ? ? ? ? ?Therapy/Group: Individual Therapy ? ? ?Excell Seltzer, PT, DPT, CSRS ? ?01/01/2022, 5:16 PM  ?

## 2022-01-01 NOTE — Progress Notes (Addendum)
Encouraged and educated patient on the importance of oral fluids and nutritional intake (snacks and meals). Also, educated patient on the importance of preventing hypoglycemia due to diabetes. ?Patient states she understands but has poor appetite. ? ?Yehuda Mao, LPN ?  ?

## 2022-01-01 NOTE — Progress Notes (Signed)
Speech Language Pathology Weekly Progress and Session Note ? ?Patient Details  ?Name: [name] ?MRN: [id number] ?Date of Birth: 09-15-1951 ? ?Beginning of progress report period: December 25, 2021 ?End of progress report period: January 01, 2022 ? ?Today's Date: 01/01/2022 ?SLP Individual Time: 5784-6962 ?SLP Individual Time Calculation (min): 41 min ? ?Short Term Goals: ?Week 4: SLP Short Term Goal 1 (Week 4): STG=LTG due to short ELOS ?SLP Short Term Goal 1 - Progress (Week 4): Not met ? ?  ?New Short Term Goals: ?Week 5: SLP Short Term Goal 1 (Week 5): STG=LTG due to awating SNF (longer than expected) ? ?Weekly Progress Updates: Pt is making slow progress and nearing goal level. When participatory, pt has made improvements in selective attention, problem solving, emergent awareness, recall and expressing thoughts. Pt's barriers are participation, reduced PO intake, forming clear thoughts at sentence level and cognitive deficits mentioned above. Pt is continuing to await SNF placement. Pt would continue to benefit from skilled ST services in order to maximize functional independence and reduce burden of care, requiring 24 hour supervision and continued ST services. ?  ? ? ?Intensity: Minumum of 1-2 x/day, 30 to 90 minutes ?Frequency: 3 to 5 out of 7 days ?Duration/Length of Stay: TBD awaiting SNF placement ?Treatment/Interventions: Cognitive remediation/compensation;Dysphagia/aspiration precaution training;Internal/external aids;Speech/Language facilitation;Cueing hierarchy;Environmental controls;Therapeutic Activities;Functional tasks;Patient/family education ? ? ?Daily Session ? ?Skilled Therapeutic Interventions:  Skilled ST services focused on cognitive and swallow skills. SLP facilitated PO intake of breakfast tray (regular textures and thin liquids), however despite max A verbal encouragement and offering several types of food from nursing station verse tray, only consumed liquids. Pt completed familiar 4  step ADL photo card sequence tasks, required supervision A verbal cues fade to mod I. Pt was unable to complete unfamiliar tasks (loading dishwasher, hand washes at home.) Pt was left in room with call bell within reach and bed alarm set. SLP recommends to continue skilled services.   ? ?General  ?  ?Pain  ?Pain Assessment ?Pain Scale: 0-10 ?Pain Score: 0-No pain ? ?Therapy/Group: Individual Therapy ? ?Karver Fadden ?01/01/2022, 11:21 AM ? ? ? ? ? ? ?

## 2022-01-01 NOTE — Progress Notes (Addendum)
Hypoglycemic Event ?  ?CBG:66, 73, 136 ?  ?Treatment: 8 oz juice/soda ?  ?Symptoms: None ?  ?Follow-up CBG: Time: 1601, 1618, 1626 ? ?     ?Possible Reasons for Event: inadequate meal intake. ? ? ? ? ?  ?  ?  ?  ?Marlowe Kays, LPN ?

## 2022-01-01 NOTE — Progress Notes (Addendum)
Patient ID: [name], female   DOB: [birth year], 71 y.o.   MRN: [id number] ? ?SW returned phone call to pt dtr [name] to inform that our morning meeting is for staff only, and will f/u with updates after conference.  ? ?SW went by pt room to provide updates, pt not in room.  ? ?37- SW left message for pt dtr [name] to provide udpates from team conference, inform on d/c date 3/9, and discuss SNF vs Home, and if SNF need preferred SNF. SW waiting on follow-up. ? ?1505-SW spoke with pt dtr [name] [phone]) to inform on above. She reports she is sure her sister [name] will remain in agreement with SNF placement (short term rehab). SW explained SNF placement process, and once a bed offer is made, placement is contingent upon bed offer and insurance approval. Reports she will review list again and follow-up with SW. SW shared will send out referral again to see if there are any new bed offers.  ? ?[name], MSW, LCSWA ?Office: [phone] ?Cell: [phone] ?Fax: [phone]  ?

## 2022-01-01 NOTE — Progress Notes (Signed)
Inpatient Rehabilitation Discharge Medication Review by a Pharmacist ? ?A complete drug regimen review was completed for this patient to identify any potential clinically significant medication issues. ? ?High Risk Drug Classes Is patient taking? Indication by Medication  ?Antipsychotic No   ?Anticoagulant Yes Eliquis for PE  ?Antibiotic No Completed course of Ampicillin (01/02/22)  ?Opioid Yes   ?Antiplatelet No   ?Hypoglycemics/insulin Yes Levemir for DM  ?Vasoactive Medication Yes Norvasc for BP  ?Chemotherapy No   ?Other Yes Xanax, Paxil for mood ?Lipitor for HLD ?Protonix, Bentyl for Jerrye Bushy  ? ? ? ?Type of Medication Issue Identified Description of Issue Recommendation(s)  ?Drug Interaction(s) (clinically significant) ?    ?Duplicate Therapy ?    ?Allergy ?    ?No Medication Administration End Date ?    ?Incorrect Dose ?    ?Additional Drug Therapy Needed ?    ?Significant med changes from prior encounter (inform family/care partners about these prior to discharge).    ?Other ?    ? ? ?Clinically significant medication issues were identified that warrant physician communication and completion of prescribed/recommended actions by midnight of the next day:  No ? ?Pharmacist comments: None ? ?Time spent performing this drug regimen review (minutes):  20 minutes ? ? ?Tad Moore ?01/01/2022 3:28 PM ?

## 2022-01-02 LAB — GLUCOSE, CAPILLARY
Glucose-Capillary: 73 mg/dL (ref 70–99)
Glucose-Capillary: 144 mg/dL — ABNORMAL HIGH (ref 70–99)
Glucose-Capillary: 189 mg/dL — ABNORMAL HIGH (ref 70–99)
Glucose-Capillary: 234 mg/dL — ABNORMAL HIGH (ref 70–99)
Glucose-Capillary: 117 mg/dL — ABNORMAL HIGH (ref 70–99)

## 2022-01-02 LAB — LACTATE DEHYDROGENASE: LDH: 199 U/L — ABNORMAL HIGH (ref 98–192)

## 2022-01-02 MED ORDER — PANCRELIPASE (LIP-PROT-AMYL) 10440-39150 UNITS PO TABS
20880.0000 [IU] | ORAL_TABLET | Freq: Once | ORAL | Status: AC
  Administered 2022-01-02: 20880 [IU]
  Filled 2022-01-02: qty 2

## 2022-01-02 MED ORDER — SODIUM BICARBONATE 650 MG PO TABS
650.0000 mg | ORAL_TABLET | Freq: Once | ORAL | Status: AC
  Administered 2022-01-02: 650 mg
  Filled 2022-01-02: qty 1

## 2022-01-02 MED ORDER — INSULIN DETEMIR 100 UNIT/ML ~~LOC~~ SOLN
11.0000 [IU] | Freq: Two times a day (BID) | SUBCUTANEOUS | Status: DC
  Administered 2022-01-02 – 2022-01-07 (×9): 11 [IU] via SUBCUTANEOUS
  Filled 2022-01-02 (×11): qty 0.11

## 2022-01-02 NOTE — Progress Notes (Signed)
Physical Therapy Weekly Progress Note ? ?Patient Details  ?Name: [name] ?MRN: [id number] ?Date of Birth: [birth year] ? ?Beginning of progress report period: December 26, 2021 ?End of progress report period: January 02, 2022 ? ?Today's Date: 01/02/2022 ?PT Individual Time: 2130-8657 ?PT Individual Time Calculation (min): 50 min  ? ?Patient has met 0 of 1 short term goals.  STG were set to LTG due to ELOS. Plan continues to be for pt to d/c to SNF and she is now medically stable for d/c. Awaiting family decision of SNF location so that pt can d/c. Pt continues to exhibit inconsistent progress with variable participation in therapy sessions. She is Supervision level overall for bed mobility, ranges from CGA to mod A for sit to stand and transfers with RW, and has been able to perform short distance gait up to 20 ft with anywhere from CGA to mod A needed for balance. Pt remains limited by her cognitive deficits and her anxiety. ? ?Patient continues to demonstrate the following deficits muscle weakness, decreased cardiorespiratoy endurance, decreased coordination, decreased initiation, decreased attention, decreased awareness, decreased problem solving, decreased safety awareness, decreased memory, and delayed processing, and decreased sitting balance, decreased standing balance, decreased postural control, and decreased balance strategies and therefore will continue to benefit from skilled PT intervention to increase functional independence with mobility. ? ?Patient progressing toward long term goals..  Continue plan of care. ? ?PT Short Term Goals ?Week 4:  PT Short Term Goal 1 (Week 4): =LTG due to ELOS ?PT Short Term Goal 1 - Progress (Week 4): Progressing toward goal ?Week 5:  PT Short Term Goal 1 (Week 5): =LTG due to ELOS ? ?Skilled Therapeutic Interventions/Progress Updates:  ?  Pt received sidelying in bed, agreeable to participate in therapy session. No complaints of pain. Pt reports she would like to eat her  lunch, wants to get up to sink to wash her hands and her face first. Supine to sit with Supervision. Once seated EOB nursing informs patient that her daughter has been trying to reach her via cellphone for Medicare questions. Assisted pt with calling her daughter [name]. Pt able to provide information to her daughter and Medicare but becomes visibly upset and anxious following phone call, difficulty redirecting to therapy tasks. Pt able to eat some of her lunch while seated EOB, then requests again to get up to wash her hands. Sit to stand with min A to RW. Upon ambulation to sink with RW pt reports she actually needs to use the bathroom. Ambulatory transfer into bathroom with RW and min to mod A for balance due to some anxiety and inability to follow cues for safety. Toilet transfer with mod A. Pt requires some assist for clothing management, independent for pericare. Pt initially wants to ambulate to sink to wash her hands, realizes her balance is impaired and requests to sit down in her w/c to wash hands. After washing her hands pt requests to return to bed. Stand pivot transfer w/c to bed with mod HHA due to pt impulsivity with standing and no time to obtain RW. Sit to supine Supervision. Pt left sidelying in bed with needs in reach, bed alarm in place. Pt missed 10 min of scheduled therapy session due to fatigue at end of session. ? ?Therapy Documentation ?Precautions:  ?Precautions ?Precautions: Fall, Other (comment) ?Precaution Comments: cortrak; right leg discomfort - PTA ?Restrictions ?Weight Bearing Restrictions: No ?General: ?PT Amount of Missed Time (min): 10 Minutes ?PT Missed Treatment Reason: Patient fatigue ? ? ? ? ?  Therapy/Group: Individual Therapy ? ? ?Excell Seltzer, PT, DPT, CSRS ? ?01/02/2022, 4:04 PM  ?

## 2022-01-02 NOTE — Progress Notes (Signed)
Occupational Therapy Session Note ? ?Patient Details  ?Name: [name] ?MRN: [id number] ?Date of Birth: [birth year] ? ?Today's Date: 01/02/2022 ?OT Individual Time: 0904-1000 ?OT Individual Time Calculation (min): 56 min  ? ? ?Short Term Goals: ?Week 1:  OT Short Term Goal 1 (Week 1): PT will performed sit to stand with mod A of +1 in prep for clothing managment for toileting/ dressing ?OT Short Term Goal 1 - Progress (Week 1): Progressing toward goal ?OT Short Term Goal 2 (Week 1): Pt will don shirt with min A in unsupported seated position ?OT Short Term Goal 2 - Progress (Week 1): Progressing toward goal ?OT Short Term Goal 3 (Week 1): Pt will be oriented x4 with min cues consistently ?OT Short Term Goal 3 - Progress (Week 1): Progressing toward goal ?Week 2:  OT Short Term Goal 1 (Week 2): STG = LTG 2/2 LOS ?OT Short Term Goal 1 - Progress (Week 2): Not met ?Week 3:  OT Short Term Goal 1 (Week 3): pt will complete UB dressing with MIN A ?OT Short Term Goal 1 - Progress (Week 3): Met ?OT Short Term Goal 2 (Week 3): pt will recall correct hand placement for sit<>stand transfers for 3 consecutive OT sessions ?OT Short Term Goal 2 - Progress (Week 3): Progressing toward goal ?OT Short Term Goal 3 (Week 3): pt wil maintain sustained attention during 2 consecutive ADL sessions ?OT Short Term Goal 3 - Progress (Week 3): Progressing toward goal ?OT Short Term Goal 4 (Week 3): pt will complete dynamic balance tasks during ADLs with MIN A ?OT Short Term Goal 4 - Progress (Week 3): Progressing toward goal ?Week 4:  OT Short Term Goal 1 (Week 4): STG=LTG 2/2 ELOS ? ?Skilled Therapeutic Interventions/Progress Updates:  ?Patient met lying supine in bed asleep. Easily awoken and in agreement with OT treatment session. 0/10 pain reported at rest and with activity. ?Patient A&O to person only. Disoriented to time, place (states [facility] and situation. Supine to EOB with CGA at trunk to come upright. UB dressing with Min  A and LB dressing with Mod A and cues for orientation of clothing. Max A to don footwear. Tornado drill initiated requiring patient to transfer to wc and report to hallway. Mod A for stand-pivot to wc on L with cues for walker management and external assist to correct posterior lean. At conclusion of drill rest of session spent on de-tangeling patients hair. Session concluded with patient seated in wc with call bell within reach, chair alarm activated and all needs met.  ? ?Therapy Documentation ?Precautions:  ?Precautions ?Precautions: Fall, Other (comment) ?Precaution Comments: cortrak; right leg discomfort - PTA ?Restrictions ?Weight Bearing Restrictions: No ?General: ?  ? ?Therapy/Group: Individual Therapy ? ?Stiven Kaspar R [place]-Davis ?01/02/2022, 10:33 AM ?

## 2022-01-02 NOTE — Progress Notes (Addendum)
Patient ID: [name], female   DOB: [birth year], 71 y.o.   MRN: [id number] ? ?SW returned phone call to [name]  [phone]) and left message. SW waiting on follow-up ? ?SW received call from Jackie/[employer] reporting that family was appealing discharge date of 3/9 and they have 48hrs to appeal a d/c date. CASE ID #[id number]; fax [phone]. She asked if a letter of non-coverage was submitted. SW explained there was a discussion about patient discharge to home if so on 3/9 and if not, will need preferred SNF locations. SW will fax 7 day clinicals to number above.  ? ?SW returned phone call to pt dtr [name] who was inquiring about claim for dentures that was filed between the time pt arrived with EMS and was in ER; and would patient have wound care needs met for wound at back of head if in a nursing home. [name] reported she spoke with her sister about all of this already. SW shared in regards to wound care, these needs would be address if SNF is still the plan. No further questions or concerns reported.  ? ?[name], MSW, LCSWA ?Office: [phone] ?Cell: [phone] ?Fax: [phone]  ?

## 2022-01-02 NOTE — Progress Notes (Signed)
PROGRESS NOTE   Subjective/Complaints:  Pt asking when we will remove Cortrak- explained she's still not eating enough.   Is cold this AM- LBM yesterday denies any issues- just doesn't feel hungry.     DIY:MEBRAXE due to cognition/behavior  Objective:   No results found. Recent Labs    12/31/21 0334  WBC 11.8*  HGB 7.5*  HCT 23.0*  PLT 201   Recent Labs    12/31/21 0334  NA 138  K 4.8  CL 106  CO2 22  GLUCOSE 86  BUN 43*  CREATININE 3.02*  CALCIUM 9.1    Intake/Output Summary (Last 24 hours) at 01/02/2022 0815 Last data filed at 01/02/2022 9407 Gross per 24 hour  Intake 300 ml  Output --  Net 300 ml     Pressure Injury 12/04/21 Head Posterior Unstageable - Full thickness tissue loss in which the base of the injury is covered by slough (yellow, tan, gray, green or brown) and/or eschar (tan, brown or black) in the wound bed. 3 cm round, scabbed area to po (Active)  12/04/21 2000  Location: Head  Location Orientation: Posterior  Staging: Unstageable - Full thickness tissue loss in which the base of the injury is covered by slough (yellow, tan, gray, green or brown) and/or eschar (tan, brown or black) in the wound bed.  Wound Description (Comments): 3 cm round, scabbed area to posterior head.  Present on Admission: Yes     Physical Exam: Vital Signs Blood pressure (!) 142/69, pulse 100, temperature 98.3 F (36.8 C), temperature source Oral, resp. rate 19, height 5' 5"  (1.651 m), weight 66.3 kg, SpO2 100 %.        General: awake, alert, appropriate, laying supine in bed- not covered with blankets, just sheet- covered her up; NAD HENT: conjugate gaze; oropharynx dry- lips less chapped- cortrak in place CV: regular rhythm; borderline tachycardic rate; no JVD Pulmonary: CTA B/L; no W/R/R- good air movement GI: soft, NT, ND, (+)BS; normoactive Psychiatric: appropriate Neurological: alert, but more  sleepy this AM- saw earlier than normal  Ext: no clubbing, cyanosis, or edema Psych: pleasant and cooperative, sl anxious Neuro: alert, confused, decreased insight and awareness but able to follow basic commands and participate in conversation Bilateral lower extremities: LE 3/5 HF/KE R foot drop- at rest in PF Skin: unstageable wound with scabbing on back of head 3-4cm in diameter- also has popped/dry blister on L sternum- slough gone from base- -no change on head wound today- large scab/thick on back of head  Assessment/Plan: 1. Functional deficits which require 3+ hours per day of interdisciplinary therapy in a comprehensive inpatient rehab setting. Physiatrist is providing close team supervision and 24 hour management of active medical problems listed below. Physiatrist and rehab team continue to assess barriers to discharge/monitor patient progress toward functional and medical goals  Care Tool:  Bathing    Body parts bathed by patient: Right arm, Left arm, Chest, Abdomen, Front perineal area, Buttocks, Right upper leg, Left upper leg, Face   Body parts bathed by helper: Front perineal area, Buttocks, Right lower leg, Left lower leg     Bathing assist Assist Level: Minimal Assistance - Patient > 75%  Upper Body Dressing/Undressing Upper body dressing   What is the patient wearing?: Pull over shirt    Upper body assist Assist Level: Minimal Assistance - Patient > 75%    Lower Body Dressing/Undressing Lower body dressing      What is the patient wearing?: Pants, Underwear/pull up     Lower body assist Assist for lower body dressing: Moderate Assistance - Patient 50 - 74%     Toileting Toileting Toileting Activity did not occur (Clothing management and hygiene only): N/A (no void or bm)  Toileting assist Assist for toileting: Moderate Assistance - Patient 50 - 74%     Transfers Chair/bed transfer  Transfers assist     Chair/bed transfer assist level: Moderate  Assistance - Patient 50 - 74%     Locomotion Ambulation   Ambulation assist   Ambulation activity did not occur: Safety/medical concerns  Assist level: Minimal Assistance - Patient > 75% Assistive device: Walker-rolling Max distance: 20'   Walk 10 feet activity   Assist  Walk 10 feet activity did not occur: Safety/medical concerns  Assist level: Minimal Assistance - Patient > 75% Assistive device: Walker-rolling   Walk 50 feet activity   Assist Walk 50 feet with 2 turns activity did not occur: Safety/medical concerns         Walk 150 feet activity   Assist Walk 150 feet activity did not occur: Safety/medical concerns         Walk 10 feet on uneven surface  activity   Assist Walk 10 feet on uneven surfaces activity did not occur: Safety/medical concerns         Wheelchair     Assist Is the patient using a wheelchair?: Yes Type of Wheelchair: Manual    Wheelchair assist level: Supervision/Verbal cueing Max wheelchair distance: 21'    Wheelchair 50 feet with 2 turns activity    Assist        Assist Level: Supervision/Verbal cueing   Wheelchair 150 feet activity     Assist      Assist Level: Dependent - Patient 0%   Blood pressure (!) 142/69, pulse 100, temperature 98.3 F (36.8 C), temperature source Oral, resp. rate 19, height 5' 5"  (1.651 m), weight 66.3 kg, SpO2 100 %.  Medical Problem List and Plan: 1. Functional deficits secondary to debility related to VF cardiac arrest secondary to massive pulmonary emboli status post mechanical thrombectomy            D/C SNF once medically appropriate Will d/w SW that pt will be done with IV ABX on 3/8- need to figure out the nutrition aspect- will d/w daughter again- only eating 30-40% of total calories/day-  -con't CIR- PT, OT and SLP-   2.  Antithrombotics: -DVT/anticoagulation:  Pharmaceutical: Other (comment) Eliquis- has to use due to saddle PE 2/23- stopped due to plts by  Heme/Onc  3/1- restarted Eliquis at 2.5 mg BID  3/6- changed to 5 mg BID since plts back up.              -antiplatelet therapy: N/A 3. Pain Management: Neurontin 100 mg twice daily, oxycodone as needed  Stopped gabapentin due ot sedation  3/2- pain controlled when takes pain meds 4. Mood anxiety-  Cymbalta 20 mg daily, Xanax 0.25 mg daily as needed provide emotional support  2/21- change to Paxil 20 mg nightly- 2/24- no side effects noted per pt- flat, but looks less anxious  3/1- increased Paxil to 40 mg nightly for continued anxiety             -  antipsychotic agents: N/A 5. Neuropsych: This patient is not capable of making decisions on her own behalf. 6. Skin/Wound Care: Routine skin checks 7. Fluids/Electrolytes/Nutrition: still not eating much  -continue TF for now.   -hopefully po intake will pick up as mentation improves.   3/7- pt and [name] doesn't want PEG- trying to avoid- will d/c TF's for now and see if that makes pt hungrier.   3/8- suggested removing Cortrak/stop TF's- and see if pt eats more? Family emphatic doesn't want PEG, so really don't have a choice if going to SNF.  8.  Pericasular hepatic /hemorrhagic shock/acute blood loss anemia/status post left liver lobe embolization  Hgb down again to 7.7 repeat non contrast CT abd no change in pericapsular hepatic bleed , check stool OB  2/24- Hb 8.0- will con't to monitor  2/28- Hb down slightly to 7.5  3/1- will recheck in AM  3/2- Hb up to 7.8  3/6- Hb 7.5 again- slowly trending down  3/7- recheck Thursday 9.  AKI secondary to ATN.  Follow-up per renal services.  Started clip for AKI- not ESRD, however Cr almost 5 today- getting HD tomorrow 2/8.   2/9- last Cr 5.74- up from 4.97 up from 4.45- rising- per renal- likely HD tomorrow?.  2/24- HD catheter removed due to bacteremia- renal following for possible more HD Appreciate nephro recs 3/3- no more HD per renal- still has Cr ~ 3.2- likely this is new baseline 3/6- Cr  down to 3.02- stable.  10.  Thrombocytopenia.  Suspect ITP.  Follow-up per hematology 2/21- Plts were 214- down to 17k- will transfuse and cal Heme/Onc- 2/22- plts up to 27k- this Am after transfusion- Heme/Onc not sure why drop in plts- however said no transfusion unless plts drop<10k  2.23- plts 33k this AM- doing better 2/24- Plts 46k- off Eliquis per Heme/Onc- had saddle PE- but risks/benefits require her ot be off Eliquis 2/25 platelets 57, continue to monitor 2/28- plts up to 99k- Heme/Onc- said can start Eliquis- will start 2.5 mg BID- will start tomorrow 3/2- plts up to 129k- doing much better  3/6- Plts up to 201k-  11.  Hyperlipidemia.  Lipitor 12. Diabetes- Poor appetite- has Cortrak for intake due to poor appetite-stop insulin- was on at home- levemir 4 units BID when came to rehab, but dropping CBGs- so will hold- will do CBGs BID Will assess if still needs- d/c insulin due to Tfs  2/11- TF's restarted so CBGs going up- 486 this AM- restarted Levemir 4 units BID and SSI- will titrate as required.    CBG (last 3)  Recent Labs    01/01/22 2358 01/02/22 0400 01/02/22 0742  GLUCAP 112* 73 144*  Increased Levimir to 10U q12h  Changed to moderate SSI q 4h since oral intake is low   Levemir at 14 units BID  2/22- Cbgs still really elevated in 200s-300s- will add Novolog coverage per Dm coordinator 3 units at 8pm, midnight and 4am-   3/7- stopping TF's- wont make changes yet- except d/c insulin with TF's at night  3/8- Pt's CBGs low/ will decrease Levemir to 11 units BID 13. New onset confusion/poor memory- anoxia? From prolonged CPR  2/9- will check CT of head- and stop Gabapentin for nerve pain due to increased sedation- had before but so out of it per daughter- much worse last 2 days.   2/15- variable -tangential and  poor memory- will need to d/w daughter  2/21- still confused, but acutely medically ill  still- is improving  2/24- poor memory due to prolonged CPR initially,  however is doing better- 14. ABLA with chronic anemia  2/13 hb stable at 8.2  2/21- Hb stable at 8.0  2/25-hemoglobin 7.4, continue to monitor 15. Scab on back of head-pressure ulcer unstageable/Blister on sternum   2/24- scab unstageable on back of head and blister almost healed on L breast.  16. HTN with hx of VF arrest  2/10- pt's BP 178/95- running 803O/122Q- systolic- will d/w renal since not on any BP meds-   2/11- Started norvasc and BP looks better 825 systolic- will con't to monitor trend.  Vitals:   01/01/22 2033 01/02/22 0403  BP: (!) 147/76 (!) 142/69  Pulse: 84 100  Resp: 18 19  Temp: (!) 97.5 F (36.4 C) 98.3 F (36.8 C)  SpO2: 100% 100%   3/7- BP usually controlled- con't to monitor for trend 17. Constipation  2/24- LBM last night- con't to monitor 18. Leukocytosis-UTI/Bacteremia- Klebsiella and E Fecalis   2/15- will remove foley and bladder scan; also check U/A and Cx- foley was per renal .   2/17- pt has UTI- started on Cefepime yesterday- also WBC up to 17k  2/18 WBC down to 13.2K , recheck in am   2/20- WBC up to 18.3k- due to HD catheter and no improvement- actually worse, d/w ID and pharmacy- agreed to change cefepime to rocephin- also start Vanc and check blood Cx's.   2/21- changed to Ampicillin with ID discussion- off Vanc due to E fecalis   2/22- WBC still 16.4- will check daily fo rnow  2/23- WBC 14.2- coming down  2/24- WBC 14.5- Neutrophils 70% so not a left shift- stable-   2/25-WBC 12.8, improving  2/27- labs in AM- cannot get TEE inpt due to thrombocytopenia per Cards- still on Ampicillin-  2/28- WBC 13.4- down from 14.9- con't regimen- will check ESR and CRP- will check another set of blood Cx's   3/1- ESR 79 and CRP 0.8 3/2- WBC 11.9- doing better  3/6- WBC 11.8- stable- don't see another cause of elevated WBC.   3/8- labs again tomorrow 19. Fall  2/16- pt on floor last night- denied pain- CT head was ordered - pt refused to keep on mask to go  downstairs- will get this AM.  3/1- fell last night- no injuries  3/2- moved closer to desk 20. Poor appetite-  2/17- will get PEG placed next week so can remove Cortrak- spoke with PA to get ordered  2/20- order in, but cannot do when ill/sick/  2/21- wait on PEG- con't cortrak  2/22- pt eating entire banana this AM- which is new  2/27- con't Cortrak/TF's at night-  2/28- placed nutrition consult- to see if can not do PEG/ can stop TFs' ? Wait to remove Cortrak. Pt doesn't want PEG_ will d/ PEG order for now- and see if pt can eat more.   3/1- calorie count in progress  3/4 intake 10-30%, discussion re: PEG per Dr. Dagoberto Ligas and family/pt.   3/6- will d/w daughter- really either needs PEG or agree she will dwindle.   3/7- daughter/pt don't want PEG- will stop TF's and see if she eats more- daughter to bring in food from home- will add Ambesol for roof of mouth soreness with meals 21. Anxiety  2/27- appears worse this AM- will ask nursing to give AM anxiety meds  3/1- increased Paxil to 40 mg nightly 22. Mass on liver?  2/28- needs outpt w/u  with Heme/Onc.  23. Dispo  2/28- cannot get Medical POA so interim guardianship- [name] 24. hyperkalemia  2/28- will start Dewart- put in by Nephrology  3/1- K+ stilll 5.4- will stop bananas and OJ in tray- and con't Lokelma- recheck labs in AM  3/3- will recheck labs Monday -wrote for no bananas.   3/6- K+ 4.8-   3/7- explained to daughter that bananas cause her potassium to go up    I spent a total of  37  minutes on total care today- >50% coordination of care- due to education of pt about cortrak, eating and DM titration of meds- and d/w nursing about CBGs.      *Per ID note, will need IV ABX til 3/8- unless leaves, then can switch to renally dose Amoxicillin  Get 2 sets of blood C'x-s 1 week after end of ABX- ~ 3/15 for surveillance.   LOS: 29 days A FACE TO FACE EVALUATION WAS PERFORMED  Kolsen Choe 01/02/2022, 8:15 AM

## 2022-01-02 NOTE — Progress Notes (Signed)
Occupational Therapy Session Note ? ?Patient Details  ?Name: [name] ?MRN: [id number] ?Date of Birth: 1951/03/10 ? ?Today's Date: 01/02/2022 ?OT Individual Time: 9242-6834 ?OT Individual Time Calculation (min): 30 min  and Today's Date: 01/02/2022 ?OT Missed Time: 45 Minutes ?Missed Time Reason: Patient fatigue;Patient unwilling/refused to participate without medical reason ? ? ?Short Term Goals: ?Week 4:  OT Short Term Goal 1 (Week 4): STG=LTG 2/2 ELOS ? ?Skilled Therapeutic Interventions/Progress Updates:  ?  Pt seated in w/c upon arrival. Pt requested to return to bed. Pt continued to decline participating in therapy. Max encouragement offered but pt adamant. Pt had removed shoes and was dependent for donning shoes before stand pivot tranfser to EOB. Stand pivot transfer to EOB with min A and max verbal cues for sequencing. Sit>supine with supervision. Pt directed placement of pillows and blanket. Pt remained in bed with bed alarm activated. All needs within reach. Pt missed 45 mins skilled OT services. Will attempt to see pt as time allows.  ? ?Therapy Documentation ?Precautions:  ?Precautions ?Precautions: Fall, Other (comment) ?Precaution Comments: cortrak; right leg discomfort - PTA ?Restrictions ?Weight Bearing Restrictions: No ?General: ?General ?OT Amount of Missed Time: 45 Minutes ? ?Pain: ?Pain Assessment ?Pain Scale: 0-10 ?Pain Score: 0-No pain ? ? ?Therapy/Group: Individual Therapy ? ?Leroy Libman ?01/02/2022, 11:44 AM ?

## 2022-01-03 LAB — CBC WITH DIFFERENTIAL/PLATELET
Abs Immature Granulocytes: 0.05 10*3/uL (ref 0.00–0.07)
Basophils Absolute: 0.1 10*3/uL (ref 0.0–0.1)
Basophils Relative: 1 %
Eosinophils Absolute: 0.2 10*3/uL (ref 0.0–0.5)
Eosinophils Relative: 2 %
HCT: 24.7 % — ABNORMAL LOW (ref 36.0–46.0)
Hemoglobin: 7.8 g/dL — ABNORMAL LOW (ref 12.0–15.0)
Immature Granulocytes: 1 %
Lymphocytes Relative: 19 %
Lymphs Abs: 1.9 10*3/uL (ref 0.7–4.0)
MCH: 28.2 pg (ref 26.0–34.0)
MCHC: 31.6 g/dL (ref 30.0–36.0)
MCV: 89.2 fL (ref 80.0–100.0)
Monocytes Absolute: 0.8 10*3/uL (ref 0.1–1.0)
Monocytes Relative: 8 %
Neutro Abs: 6.9 10*3/uL (ref 1.7–7.7)
Neutrophils Relative %: 69 %
Platelets: 290 10*3/uL (ref 150–400)
RBC: 2.77 MIL/uL — ABNORMAL LOW (ref 3.87–5.11)
RDW: 14.6 % (ref 11.5–15.5)
WBC: 9.9 10*3/uL (ref 4.0–10.5)
nRBC: 0 % (ref 0.0–0.2)

## 2022-01-03 LAB — COMPREHENSIVE METABOLIC PANEL
ALT: 38 U/L (ref 0–44)
AST: 21 U/L (ref 15–41)
Albumin: 2.8 g/dL — ABNORMAL LOW (ref 3.5–5.0)
Alkaline Phosphatase: 75 U/L (ref 38–126)
Anion gap: 11 (ref 5–15)
BUN: 40 mg/dL — ABNORMAL HIGH (ref 8–23)
CO2: 20 mmol/L — ABNORMAL LOW (ref 22–32)
Calcium: 9.6 mg/dL (ref 8.9–10.3)
Chloride: 110 mmol/L (ref 98–111)
Creatinine, Ser: 2.7 mg/dL — ABNORMAL HIGH (ref 0.44–1.00)
GFR, Estimated: 18 mL/min — ABNORMAL LOW (ref >60)
Glucose, Bld: 102 mg/dL — ABNORMAL HIGH (ref 70–99)
Potassium: 4.2 mmol/L (ref 3.5–5.1)
Sodium: 141 mmol/L (ref 135–145)
Total Bilirubin: 0.5 mg/dL (ref 0.3–1.2)
Total Protein: 7.2 g/dL (ref 6.5–8.1)

## 2022-01-03 LAB — GLUCOSE, CAPILLARY
Glucose-Capillary: 104 mg/dL — ABNORMAL HIGH (ref 70–99)
Glucose-Capillary: 110 mg/dL — ABNORMAL HIGH (ref 70–99)
Glucose-Capillary: 111 mg/dL — ABNORMAL HIGH (ref 70–99)
Glucose-Capillary: 119 mg/dL — ABNORMAL HIGH (ref 70–99)
Glucose-Capillary: 249 mg/dL — ABNORMAL HIGH (ref 70–99)
Glucose-Capillary: 69 mg/dL — ABNORMAL LOW (ref 70–99)

## 2022-01-03 LAB — LACTATE DEHYDROGENASE: LDH: 212 U/L — ABNORMAL HIGH (ref 98–192)

## 2022-01-03 LAB — SURGICAL PATHOLOGY

## 2022-01-03 MED ORDER — PROSOURCE PLUS PO LIQD
30.0000 mL | Freq: Two times a day (BID) | ORAL | Status: DC
  Administered 2022-01-04 – 2022-01-07 (×7): 30 mL via ORAL
  Filled 2022-01-03 (×4): qty 30

## 2022-01-03 NOTE — Progress Notes (Signed)
Speech Language Pathology Daily Session Note ? ?Patient Details  ?Name: [name] ?MRN: [id number] ?Date of Birth: 1951/10/15 ? ?Today's Date: 01/03/2022 ?SLP Individual Time: 6349-4944 ?SLP Individual Time Calculation (min): 25 min and Today's Date: 01/03/2022 ?SLP Missed Time: 20 Minutes ?Missed Time Reason: Patient unwilling to participate;Patient fatigue ? ?Short Term Goals: ?Week 4: SLP Short Term Goal 1 (Week 4): STG=LTG due to short ELOS ?SLP Short Term Goal 1 - Progress (Week 4): Not met ? ?Skilled Therapeutic Interventions: Skilled ST treatment focused on cognitive goals. Patient was awake and alert on arrival however became progressively sleepy throughout session and required intermittent verbal cues to rouse. Pt was oriented to person, day of week, and year only. She reported incorrect month ("February"), place ("we're in a hotel") and situation ("I don't know"). Pt benefited from extended processing time and encouragement to participate. She eventually closed her eyes and stopped responding the therapists attempts to engage. She eventually said "I'm not doing therapy today." Pt missed 20 minutes of skilled ST due to declining to participate further and fatigue. Patient was left in bed with alarm activated and immediate needs within reach at end of session. Continue per current plan of care.   ?   ?Pain ?Pain Assessment ?Pain Scale: 0-10 ?Pain Score: 0-No pain ?Pain Type: Acute pain ?Pain Location: Foot ?Pain Orientation: Right;Medial ?Pain Descriptors / Indicators: Aching;Sore ?Pain Frequency: Constant ?Pain Onset: On-going ?Patients Stated Pain Goal: 0 ?Pain Intervention(s): Medication (See eMAR) ? ?Therapy/Group: Individual Therapy ? ?Timera Windt T Tifani Dack ?01/03/2022, 2:11 PM ?

## 2022-01-03 NOTE — Progress Notes (Signed)
Physical Therapy Session Note ? ?Patient Details  ?Name: [name] ?MRN: [id number] ?Date of Birth: 1951/07/17 ? ?Today's Date: 01/03/2022 ?PT Individual Time: 2979-8921 ?PT Individual Time Calculation (min): 72 min  ? ?Short Term Goals: ?Week 5:  PT Short Term Goal 1 (Week 5): =LTG due to ELOS ? ?Skilled Therapeutic Interventions/Progress Updates: Pt presented in recliner agreeable to therapy. Pt noted to have spilled drink on self. With increased time and encouragement pt able to change shirt with set up. Pt then donned shoes with minA due to double socks. Pt required re-direction during tasks as was easily distracted (TV off but NT in room making bed). Pt then performed Sit to stand with minA and ambulated ~40f to w/c. Pt then transported to day room for energy conservation. Participated in ambulation with RW and w/c ~264f Pt was overall CGA for Sit to stand and ambulation. Pt participated in x 1 bout of cornhole in standing with RW. Pt requires cues throughout session for hand placement when performing Sit to stand with mild posterior lean noted. Once pt stabilized in standing pt was able to maintain good static balance while reaching for bags. Pt then required extended rest after game due to fatigue. Pt then transported to NuStep and performed stand pivot to NuStep with RW and minA. Participated in NuStep L1 x 5 min with pt requiring re-direction to complete task x 2. Pt returned to w/c in same manner as prior. Pt transported back to room and performed ambulatory transfer to bed with RW and CGA. Performed sit to supine with supervision and bed flat. Pt left in bed at end of session with bed alarm on, call bell within reach and pt falling asleep.  ?   ? ?Therapy Documentation ?Precautions:  ?Precautions ?Precautions: Fall, Other (comment) ?Precaution Comments: cortrak; right leg discomfort - PTA ?Restrictions ?Weight Bearing Restrictions: No ?General: ?  ?Vital Signs: ?Therapy Vitals ?Temp: 97.7 ?F (36.5  ?C) ?Temp Source: Oral ?Pulse Rate: (!) 109 ?Resp: 18 ?BP: (!) 112/57 ?Patient Position (if appropriate): Lying ?Oxygen Therapy ?SpO2: 100 % ?O2 Device: Room Air ?Pain: ?Pain Assessment ?Pain Scale: 0-10 ?Pain Score: 5  ?Pain Type: Acute pain ?Pain Location: Foot ?Pain Orientation: Right ?Pain Descriptors / Indicators: Aching;Constant ?Pain Frequency: Constant ?Pain Onset: Progressive ?Patients Stated Pain Goal: 0 ?Pain Intervention(s): Medication (See eMAR) ?Multiple Pain Sites: No ?Mobility: ?  ?Locomotion : ?   ?Trunk/Postural Assessment : ?   ?Balance: ?  ?Exercises: ?  ?Other Treatments:   ? ? ? ?Therapy/Group: Individual Therapy ? ?Bevin Mayall ?01/03/2022, 4:10 PM  ?

## 2022-01-03 NOTE — Progress Notes (Signed)
Nutrition Follow-up ? ?DOCUMENTATION CODES:  ? ?Not applicable ? ?INTERVENTION:  ?Provide Nepro Shake po TID, each supplement provides 425 kcal and 19 grams protein. ? ?Provide 30 ml Prosource plus po BID, each supplement provides 100 kcal and 15 grams of protein.  ? ?Encourage PO intake.  ? ?NUTRITION DIAGNOSIS:  ? ?Increased nutrient needs related to chronic illness (renal failure on HD) as evidenced by estimated needs; ongoing ? ?GOAL:  ? ?Patient will meet greater than or equal to 90% of their needs; not met ? ?MONITOR:  ? ?PO intake, Supplement acceptance, Labs, Weight trends, Skin, I & O's ? ?REASON FOR ASSESSMENT:  ? ?Consult ?Calorie Count ? ?ASSESSMENT:  ? ?71 year old female with history of asthma, diabetes mellitus, fibromyalgia, hypertension, hyperlipidemia. Presents after loss of consciousness. Pt with functional deficits secondary to debility related to VF cardiac arrest secondary to massive pulmonary emboli status post mechanical thrombectomy. Pt with renal failure secondary to ATN and hemodialysis initiated and awaiting plan for long-term hemodialysis. Admitted to CIR. Cortrak placed 1/20. Tip of tube in stomach. ? ?Unable to unclog Cortrak yesterday, thus tube was removed. Meal completion 0-25%. MD has discussed with pt and family that pt po intake is inadequate however family and pt refusing PEG and tube feeding. Family understanding of consequences on health related to inadequate nutrition. RD to continue providing nutritional supplements. Will additionally order Prosource to aid in protein needs.  ? ?Labs and medications reviewed.  ? ?Diet Order:   ?Diet Order   ? ?       ?  Diet heart healthy/carb modified Room service appropriate? Yes; Fluid consistency: Thin  Diet effective now       ?  ? ?  ?  ? ?  ? ? ?EDUCATION NEEDS:  ? ?Not appropriate for education at this time ? ?Skin:  Skin Assessment: Reviewed RN Assessment ?Skin Integrity Issues:: Stage II, Other (Comment) ?Stage II: L  sternum ?Other: MASD to buttocks, skin tear to buttock and R elbow ? ?Last BM:  3/8 ? ?Height:  ? ?Ht Readings from Last 1 Encounters:  ?12/04/21 5' 5"  (1.651 m)  ? ? ?Weight:  ? ?Wt Readings from Last 1 Encounters:  ?01/03/22 66.3 kg  ? ?BMI:  Body mass index is 24.32 kg/m?. ? ?Estimated Nutritional Needs:  ? ?Kcal:  1950-2150 ? ?Protein:  100-125 grams ? ?Fluid:  1L + UOP ? ?Corrin Parker, MS, RD, LDN ?RD pager number/after hours weekend pager number on Amion. ? ?

## 2022-01-03 NOTE — Progress Notes (Signed)
Occupational Therapy Session Note ? ?Patient Details  ?Name: [name] ?MRN: [id number] ?Date of Birth: [birth year] ? ?Today's Date: 01/03/2022 ?OT Individual Time: 2706-2376 ?OT Individual Time Calculation (min): 53 min  ? ? ?Short Term Goals: ?Week 4:  OT Short Term Goal 1 (Week 4): STG=LTG 2/2 ELOS ? ?Skilled Therapeutic Interventions/Progress Updates:  ?Pt greeted supine in bed with RN and MD present. pt agreeable to OT intervention. Pt completed supine >sit with supervision, RT retrieved breakfast tray as MD wanting pt to make sure she is getting enough nutrition now that NG tube was been removed. Pt seems to be more confused today stating that we are in a nursing home even with environmental cues provided. Pt does not recall having her hair washed by other OT yesterday. Pt needs complete quiet and timer placed to focus on eating her breakfast as pt easily distracted by extraneous stimuli such as braiding her hair. Pt given 3 mins to work on eating with no distractions with pt only eating 2 bites. Pt then reports I would rather drink the Nepro, provided pt with quiet time to finish entire Nepro.    Pt completed dressing from EOB with s/u assist for UB dressing and MOD A for LB dressing, pt able to thread pants but needed assist to pull to waist line in standing. Pt applied lotion to LB with close supervision. Pt needed MOD A for sit>stand with Rw d/t heavy posterior lean and MOD A to pivot d/t posterior lean and impaired balance.         pt left seated in recliner with alarm belt activated and all needs within reach.                 ? ? ?Therapy Documentation ?Precautions:  ?Precautions ?Precautions: Fall, Other (comment) ?Precaution Comments: cortrak; right leg discomfort - PTA ?Restrictions ?Weight Bearing Restrictions: No ? ? ?Pain: urated pain in R foot, rest breaks provided as needed with pain patch already applied.  ? ? ? ?Therapy/Group: Individual Therapy ? ?[name] ?01/03/2022, 8:56 AM ?

## 2022-01-03 NOTE — Plan of Care (Signed)
?  Problem: Consults ?Goal: RH GENERAL PATIENT EDUCATION ?Description: See Patient Education module for education specifics. ?Outcome: Progressing ?Goal: Skin Care Protocol Initiated - if Braden Score 18 or less ?Description: If consults are not indicated, leave blank or document N/A ?Outcome: Progressing ?Goal: Nutrition Consult-if indicated ?Outcome: Progressing ?Goal: Diabetes Guidelines if Diabetic/Glucose > 140 ?Description: If diabetic or lab glucose is > 140 mg/dl - Initiate Diabetes/Hyperglycemia Guidelines & Document Interventions  ?Outcome: Progressing ?  ?Problem: RH SKIN INTEGRITY ?Goal: RH STG MAINTAIN SKIN INTEGRITY WITH ASSISTANCE ?Description: STG Maintain Skin Integrity With Supervision Assistance. ?Outcome: Progressing ?Goal: RH STG ABLE TO PERFORM INCISION/WOUND CARE W/ASSISTANCE ?Description: STG Able To Perform Incision/Wound Care With Supervision Assistance. ?Outcome: Progressing ?  ?Problem: RH SAFETY ?Goal: RH STG ADHERE TO SAFETY PRECAUTIONS W/ASSISTANCE/DEVICE ?Description: STG Adhere to Safety Precautions With Cues and Reminders. ?Outcome: Progressing ?Goal: RH STG DECREASED RISK OF FALL WITH ASSISTANCE ?Description: STG Decreased Risk of Fall With Supervision Assistance. ?Outcome: Progressing ?  ?Problem: RH KNOWLEDGE DEFICIT GENERAL ?Goal: RH STG INCREASE KNOWLEDGE OF SELF CARE AFTER HOSPITALIZATION ?Description: Patient will demonstrate knowledge of medication management, skin/wound care, and safety with educational materials and handouts provided by staff independently at discharge. ?Outcome: Progressing ?  ?

## 2022-01-03 NOTE — Progress Notes (Addendum)
Patient ID: [name], female   DOB: [birth year], 71 y.o.   MRN: [id number] ? ?SW received phone call from pt dtr [name] [phone]) who was asking for Rx for w/c and wanted to know if there were any other care needs. SW clarified with family that they are willing for her to d/c to [facility] home since pt is unable to care for herself. SW informed will now inform medical team of change to d/c to home instead of SNF since SNF was last discussion. SW also indicated will get updates on DME and HH needs and SW will follow-up once there is more information. SW reiterated pt will not discharge until there is an answer from Benjamin Perez on d/c date.  ? ?SW shared updates with medical team, and now waiting on d/c recs and decision from [place].  ? ?SW received phone call from [facility] informing that the patient lost the case with last covered date today 3/9 and discharge tomorrow. States family has until 12 noon tomorrow to submit second level of appeal and financial responsibly begins after 12 noon tomorrow and will be financially responsible. Case ID # for second appeal: [id number]. ? ?*SW spoke with pt dtr [name]. She is aware of the financial responsibly. SW informed on DME needs: RW, 3in1 BSC, and w/c needed. SW emailed [facility] list [email or web address]) for their review and waiting on preference.  ? ?SW ordered DME with [employer] via parachute.  ? ?[facility] address: [address], [place] [address]. ? ?[name], MSW, LCSWA ?Office: [phone] ?Cell: [phone] ?Fax: [phone]  ?

## 2022-01-03 NOTE — Progress Notes (Signed)
PROGRESS NOTE   Subjective/Complaints:  Pt reports doesn't have a tray to eat this AM- asked nursing to find vs giving supplement.   Pt got Cortrak out due to clogging- couldn't unclog yesterday- LBM overnight.   Pt irritable about having to take her meds so fast.   BVQ:[id number] due to behavior/cognition  Objective:   No results found. Recent Labs    01/03/22 0511  WBC 9.9  HGB 7.8*  HCT 24.7*  PLT 290   Recent Labs    01/03/22 0511  NA 141  K 4.2  CL 110  CO2 20*  GLUCOSE 102*  BUN 40*  CREATININE 2.70*  CALCIUM 9.6    Intake/Output Summary (Last 24 hours) at 01/03/2022 0911 Last data filed at 01/02/2022 1912 Gross per 24 hour  Intake 1321 ml  Output --  Net 1321 ml     Pressure Injury 12/04/21 Head Posterior Unstageable - Full thickness tissue loss in which the base of the injury is covered by slough (yellow, tan, gray, green or brown) and/or eschar (tan, brown or black) in the wound bed. 3 cm round, scabbed area to po (Active)  12/04/21 2000  Location: Head  Location Orientation: Posterior  Staging: Unstageable - Full thickness tissue loss in which the base of the injury is covered by slough (yellow, tan, gray, green or brown) and/or eschar (tan, brown or black) in the wound bed.  Wound Description (Comments): 3 cm round, scabbed area to posterior head.  Present on Admission: Yes     Physical Exam: Vital Signs Blood pressure 139/82, pulse (!) 101, temperature 98.7 F (37.1 C), temperature source Oral, resp. rate 18, height 5' 5"  (1.651 m), weight 66.3 kg, SpO2 100 %.         General: awake, alert, appropriate, sitting up in bed; nurse and OT in room;  NAD HENT: conjugate gaze; oropharynx moist- cortrak out CV: regular rhythm; slightly tachycardic  rate; no JVD Pulmonary: CTA B/L; no W/R/R- good air movement GI: soft, NT, ND, (+)BS Psychiatric: appropriate- irritable Neurological: alert-  more alert and feistier Ext: no clubbing, cyanosis, or edema Psych: pleasant and cooperative, sl anxious Neuro: alert, confused, decreased insight and awareness but able to follow basic commands and participate in conversation Bilateral lower extremities: LE 3/5 HF/KE R foot drop- at rest in PF Skin: unstageable wound with scabbing on back of head 3-4cm in diameter- also has popped/dry blister on L sternum- slough gone from base- -no change on head wound today- large scab/thick on back of head  Assessment/Plan: 1. Functional deficits which require 3+ hours per day of interdisciplinary therapy in a comprehensive inpatient rehab setting. Physiatrist is providing close team supervision and 24 hour management of active medical problems listed below. Physiatrist and rehab team continue to assess barriers to discharge/monitor patient progress toward functional and medical goals  Care Tool:  Bathing    Body parts bathed by patient: Right arm, Left arm, Chest, Abdomen, Front perineal area, Buttocks, Right upper leg, Left upper leg, Face   Body parts bathed by helper: Front perineal area, Buttocks, Right lower leg, Left lower leg     Bathing assist Assist Level: Minimal Assistance - Patient >  75%     Upper Body Dressing/Undressing Upper body dressing   What is the patient wearing?: Pull over shirt    Upper body assist Assist Level: Set up assist    Lower Body Dressing/Undressing Lower body dressing      What is the patient wearing?: Pants     Lower body assist Assist for lower body dressing: Moderate Assistance - Patient 50 - 74%     Toileting Toileting Toileting Activity did not occur (Clothing management and hygiene only): N/A (no void or bm)  Toileting assist Assist for toileting: Moderate Assistance - Patient 50 - 74%     Transfers Chair/bed transfer  Transfers assist     Chair/bed transfer assist level: Moderate Assistance - Patient 50 - 74%      Locomotion Ambulation   Ambulation assist   Ambulation activity did not occur: Safety/medical concerns  Assist level: Moderate Assistance - Patient 50 - 74% Assistive device: Walker-rolling Max distance: 20'   Walk 10 feet activity   Assist  Walk 10 feet activity did not occur: Safety/medical concerns  Assist level: Moderate Assistance - Patient - 50 - 74% Assistive device: Walker-rolling   Walk 50 feet activity   Assist Walk 50 feet with 2 turns activity did not occur: Safety/medical concerns         Walk 150 feet activity   Assist Walk 150 feet activity did not occur: Safety/medical concerns         Walk 10 feet on uneven surface  activity   Assist Walk 10 feet on uneven surfaces activity did not occur: Safety/medical concerns         Wheelchair     Assist Is the patient using a wheelchair?: Yes Type of Wheelchair: Manual    Wheelchair assist level: Supervision/Verbal cueing Max wheelchair distance: 75'    Wheelchair 50 feet with 2 turns activity    Assist        Assist Level: Supervision/Verbal cueing   Wheelchair 150 feet activity     Assist      Assist Level: Dependent - Patient 0%   Blood pressure 139/82, pulse (!) 101, temperature 98.7 F (37.1 C), temperature source Oral, resp. rate 18, height 5' 5"  (1.651 m), weight 66.3 kg, SpO2 100 %.  Medical Problem List and Plan: 1. Functional deficits secondary to debility related to VF cardiac arrest secondary to massive pulmonary emboli status post mechanical thrombectomy            D/C SNF once medically appropriate Will d/w SW that pt will be done with IV ABX on 3/8- need to figure out the nutrition aspect- will d/w daughter again- only eating 30-40% of total calories/day-  Con't CIR- PT, OT and SLP- trying to place- family filed appeal to not d/c, however pt stable for d/c- Cortrak out- family refuses PEG as well as pt, so cannot fix the appetite issues- and most appetite  stimulants increase sedation- on Megace.   2.  Antithrombotics: -DVT/anticoagulation:  Pharmaceutical: Other (comment) Eliquis- has to use due to saddle PE 2/23- stopped due to plts by Heme/Onc  3/1- restarted Eliquis at 2.5 mg BID  3/6- changed to 5 mg BID since plts back up.              -antiplatelet therapy: N/A 3. Pain Management: Neurontin 100 mg twice daily, oxycodone as needed  Stopped gabapentin due ot sedation  3/2- pain controlled when takes pain meds 4. Mood anxiety-  Cymbalta 20 mg daily, Xanax  0.25 mg daily as needed provide emotional support  2/21- change to Paxil 20 mg nightly- 2/24- no side effects noted per pt- flat, but looks less anxious  3/1- increased Paxil to 40 mg nightly for continued anxiety             -antipsychotic agents: N/A 5. Neuropsych: This patient is not capable of making decisions on her own behalf. 6. Skin/Wound Care: Routine skin checks 7. Fluids/Electrolytes/Nutrition: still not eating much  -continue TF for now.   -hopefully po intake will pick up as mentation improves.   3/7- pt and [name] doesn't want PEG- trying to avoid- will d/c TF's for now and see if that makes pt hungrier.   3/8- suggested removing Cortrak/stop TF's- and see if pt eats more? Family emphatic doesn't want PEG, so really don't have a choice if going to SNF.   3/9- con't to encourage pt to eat/take supplements- Cortrak out.  8.  Pericasular hepatic /hemorrhagic shock/acute blood loss anemia/status post left liver lobe embolization  Hgb down again to 7.7 repeat non contrast CT abd no change in pericapsular hepatic bleed , check stool OB  2/24- Hb 8.0- will con't to monitor  2/28- Hb down slightly to 7.5  3/1- will recheck in AM  3/2- Hb up to 7.8  3/6- Hb 7.5 again- slowly trending down  3/9- Hb 7.8-  9.  AKI secondary to ATN.  Follow-up per renal services.  Started clip for AKI- not ESRD, however Cr almost 5 today- getting HD tomorrow 2/8.   2/9- last Cr 5.74- up from 4.97  up from 4.45- rising- per renal- likely HD tomorrow?.  2/24- HD catheter removed due to bacteremia- renal following for possible more HD Appreciate nephro recs 3/3- no more HD per renal- still has Cr ~ 3.2- likely this is new baseline 3/6- Cr down to 3.02- stable 3/9- Cr 2.70 and Bun 40- doing better, actually. .  10.  Thrombocytopenia.  Suspect ITP.  Follow-up per hematology 2/21- Plts were 214- down to 17k- will transfuse and cal Heme/Onc- 2/22- plts up to 27k- this Am after transfusion- Heme/Onc not sure why drop in plts- however said no transfusion unless plts drop<10k  2.23- plts 33k this AM- doing better 2/24- Plts 46k- off Eliquis per Heme/Onc- had saddle PE- but risks/benefits require her ot be off Eliquis 2/25 platelets 57, continue to monitor 2/28- plts up to 99k- Heme/Onc- said can start Eliquis- will start 2.5 mg BID- will start tomorrow 3/2- plts up to 129k- doing much better  3/6- Plts up to 201k-   3/9- plts 290k 11.  Hyperlipidemia.  Lipitor 12. Diabetes- Poor appetite- has Cortrak for intake due to poor appetite-stop insulin- was on at home- levemir 4 units BID when came to rehab, but dropping CBGs- so will hold- will do CBGs BID Will assess if still needs- d/c insulin due to Tfs  2/11- TF's restarted so CBGs going up- 486 this AM- restarted Levemir 4 units BID and SSI- will titrate as required.    CBG (last 3)  Recent Labs    01/02/22 2004 01/02/22 2358 01/03/22 0400  GLUCAP 117* 104* 110*  Increased Levimir to 10U q12h  Changed to moderate SSI q 4h since oral intake is low   Levemir at 14 units BID  2/22- Cbgs still really elevated in 200s-300s- will add Novolog coverage per Dm coordinator 3 units at 8pm, midnight and 4am-   3/7- stopping TF's- wont make changes yet- except d/c insulin  with TF's at night  3/9- CBGs looks good- low 100s after reduction in Levemir 13. New onset confusion/poor memory- anoxia? From prolonged CPR  2/9- will check CT of head- and stop  Gabapentin for nerve pain due to increased sedation- had before but so out of it per daughter- much worse last 2 days.   2/15- variable -tangential and  poor memory- will need to d/w daughter  2/21- still confused, but acutely medically ill still- is improving  2/24- poor memory due to prolonged CPR initially, however is doing better- 14. ABLA with chronic anemia  2/13 hb stable at 8.2  2/21- Hb stable at 8.0  2/25-hemoglobin 7.4, continue to monitor 15. Scab on back of head-pressure ulcer unstageable/Blister on sternum   2/24- scab unstageable on back of head and blister almost healed on L breast.  16. HTN with hx of VF arrest  2/10- pt's BP 178/95- running 754G/920F- systolic- will d/w renal since not on any BP meds-   2/11- Started norvasc and BP looks better 007 systolic- will con't to monitor trend.  Vitals:   01/02/22 2006 01/03/22 0338  BP: 126/61 139/82  Pulse: 88 (!) 101  Resp: 18 18  Temp: 98.3 F (36.8 C) 98.7 F (37.1 C)  SpO2: 100% 100%   3/7- BP usually controlled- con't to monitor for trend 17. Constipation  2/24- LBM last night- con't to monitor 18. Leukocytosis-UTI/Bacteremia- Klebsiella and E Fecalis   2/15- will remove foley and bladder scan; also check U/A and Cx- foley was per renal .   2/17- pt has UTI- started on Cefepime yesterday- also WBC up to 17k  2/18 WBC down to 13.2K , recheck in am   2/20- WBC up to 18.3k- due to HD catheter and no improvement- actually worse, d/w ID and pharmacy- agreed to change cefepime to rocephin- also start Vanc and check blood Cx's.   2/21- changed to Ampicillin with ID discussion- off Vanc due to E fecalis   2/22- WBC still 16.4- will check daily fo rnow  2/23- WBC 14.2- coming down  2/24- WBC 14.5- Neutrophils 70% so not a left shift- stable-   2/25-WBC 12.8, improving  2/27- labs in AM- cannot get TEE inpt due to thrombocytopenia per Cards- still on Ampicillin-  2/28- WBC 13.4- down from 14.9- con't regimen- will check  ESR and CRP- will check another set of blood Cx's   3/1- ESR 79 and CRP 0.8 3/2- WBC 11.9- doing better  3/6- WBC 11.8- stable- don't see another cause of elevated WBC.   3/8- labs again tomorrow 3/9- WBC down to 9.9- doing much bette-r off IV ABX.  19. Fall  2/16- pt on floor last night- denied pain- CT head was ordered - pt refused to keep on mask to go downstairs- will get this AM.  3/1- fell last night- no injuries  3/2- moved closer to desk 20. Poor appetite-  2/17- will get PEG placed next week so can remove Cortrak- spoke with PA to get ordered  2/20- order in, but cannot do when ill/sick/  2/21- wait on PEG- con't cortrak  2/22- pt eating entire banana this AM- which is new  2/27- con't Cortrak/TF's at night-  2/28- placed nutrition consult- to see if can not do PEG/ can stop TFs' ? Wait to remove Cortrak. Pt doesn't want PEG_ will d/ PEG order for now- and see if pt can eat more.   3/1- calorie count in progress  3/4 intake 10-30%, discussion re:  PEG per Dr. Dagoberto Ligas and family/pt.   3/6- will d/w daughter- really either needs PEG or agree she will dwindle.   3/7- daughter/pt don't want PEG- will stop TF's and see if she eats more- daughter to bring in food from home- will add Ambesol for roof of mouth soreness with meals 21. Anxiety  2/27- appears worse this AM- will ask nursing to give AM anxiety meds  3/1- increased Paxil to 40 mg nightly 22. Mass on liver?  2/28- needs outpt w/u with Heme/Onc.  23. Dispo  2/28- cannot get Medical POA so interim guardianship- [name] 24. hyperkalemia  2/28- will start [place]- put in by Nephrology  3/1- K+ stilll 5.4- will stop bananas and OJ in tray- and con't Lokelma- recheck labs in AM  3/3- will recheck labs Monday -wrote for no bananas.   3/6- K+ 4.8-   3/7- explained to daughter that bananas cause her potassium to go up  3/9- K+ 4.2- much better    I spent a total of 35   minutes on total care today- >50% coordination of care-  due to d/w nursing and PA about cortrak and therapy      *Per ID note, will need IV ABX til 3/8- unless leaves, then can switch to renally dose Amoxicillin  Get 2 sets of blood C'x-s 1 week after end of ABX- ~ 3/15 for surveillance.   LOS: 30 days A FACE TO FACE EVALUATION WAS PERFORMED  Maple Odaniel 01/03/2022, 9:11 AM

## 2022-01-04 LAB — GLUCOSE, CAPILLARY
Glucose-Capillary: 88 mg/dL (ref 70–99)
Glucose-Capillary: 248 mg/dL — ABNORMAL HIGH (ref 70–99)
Glucose-Capillary: 54 mg/dL — ABNORMAL LOW (ref 70–99)
Glucose-Capillary: 99 mg/dL (ref 70–99)
Glucose-Capillary: 135 mg/dL — ABNORMAL HIGH (ref 70–99)

## 2022-01-04 LAB — LACTATE DEHYDROGENASE: LDH: 186 U/L (ref 98–192)

## 2022-01-04 NOTE — Progress Notes (Signed)
Physical Therapy Discharge Summary ? ?Patient Details  ?Name: [name] ?MRN: [id number] ?Date of Birth: Nov 28, 1950 ? ?Patient has met 7 of 7 long term goals due to improved activity tolerance, improved balance, improved postural control, increased strength, decreased pain, ability to compensate for deficits, improved attention, improved awareness, and improved coordination.  Patient to discharge at a wheelchair level Supervision and CGA to min A level for short distance ambulation.   Patient's care partner  is available  to provide the necessary physical and cognitive assistance at discharge. Pt's daughter's will be providing assistance for her upon d/c home. Pt's family did not complete family education due to planned d/c to SNF and last-minute change to d/c home. ? ?Reasons goals not met: Pt has met all rehab goals. ? ?Recommendation:  ?Patient will benefit from ongoing skilled PT services in home health setting to continue to advance safe functional mobility, address ongoing impairments in endurance, safety, balance, strength, endurance, cognition, independence with functional mobility, and minimize fall risk. ? ?Equipment: ?RW and 16x16 w/c ? ?Reasons for discharge: treatment goals met and discharge from hospital ? ?Patient/family agrees with progress made and goals achieved: Yes ? ?PT Discharge ?Precautions/Restrictions ?Precautions ?Precautions: Fall;Other (comment) ?Precaution Comments: cortrak; right leg discomfort - PTA ?Restrictions ?Weight Bearing Restrictions: No ?Pain Interference ?Pain Interference ?Pain Effect on Sleep: 1. Rarely or not at all ?Pain Interference with Therapy Activities: 2. Occasionally ?Pain Interference with Day-to-Day Activities: 2. Occasionally ?Vision/Perception  ?Perception ?Perception: Within Functional Limits ?Praxis ?Praxis: Impaired ?Praxis Impairment Details: Motor planning;Perseveration  ?Cognition ?Overall Cognitive Status: Impaired/Different from  baseline ?Arousal/Alertness: Awake/alert ?Orientation Level: Oriented to person ?Attention: Focused;Sustained ?Focused Attention: Appears intact ?Sustained Attention: Impaired ?Memory: Impaired ?Memory Impairment: Storage deficit;Decreased recall of new information ?Awareness: Impaired ?Awareness Impairment: Intellectual impairment ?Problem Solving: Impaired ?Behaviors: Restless;Impulsive;Perseveration;Poor frustration tolerance;Confabulation ?Safety/Judgment: Impaired ?Sensation ?Sensation ?Light Touch: Appears Intact ?Proprioception: Impaired by gross assessment ?Coordination ?Gross Motor Movements are Fluid and Coordinated: No ?Fine Motor Movements are Fluid and Coordinated: Yes ?Coordination and Movement Description: impaired 2/2 global weakness ?Motor  ?Motor ?Motor: Within Functional Limits ?Motor - Discharge Observations: Improved, varies depending on cognitive status  ?Mobility ?Bed Mobility ?Bed Mobility: Rolling Right;Rolling Left;Supine to Sit;Sit to Supine ?Rolling Right: Independent ?Rolling Left: Independent ?Supine to Sit: Independent ?Sit to Supine: Independent ?Transfers ?Transfers: Sit to [employer] Transfers ?Sit to Stand: Minimal Assistance - Patient > 75% ?Stand Pivot Transfers: Minimal Assistance - Patient > 75% ?Transfer (Assistive device): Rolling walker ?Locomotion  ?Gait ?Ambulation: Yes ?Gait Assistance: Contact Guard/Touching assist ?Gait Distance (Feet): 35 Feet ?Assistive device: Rolling walker ?Gait ?Gait: Yes ?Gait Pattern: Impaired ?Gait Pattern: Decreased stride length;Decreased step length - left;Decreased step length - right;Trunk flexed ?Gait velocity: Decreased ?Stairs / Additional Locomotion ?Stairs: No ?Pick up small object from the floor assist level: Minimal Assistance - Patient > 75% ?Pick up small object from the floor assistive device: RW and reacher ?Wheelchair Mobility ?Wheelchair Mobility: Yes ?Wheelchair Assistance: Supervision/Verbal cueing ?Wheelchair  Propulsion: Both upper extremities ?Wheelchair Parts Management: Needs assistance ?Distance: 150  ?Trunk/Postural Assessment  ?Cervical Assessment ?Cervical Assessment: Within Functional Limits ?Thoracic Assessment ?Thoracic Assessment: Exceptions to [facility] (rounded shoulders) ?Lumbar Assessment ?Lumbar Assessment: Exceptions to [facility] (posterior pelvic tilt) ?Postural Control ?Postural Control: Deficits on evaluation ?Righting Reactions: Delayed but improved from eval ?Protective Responses: Delayed but improved from eval  ?Balance ?Balance ?Balance Assessed: Yes ?Static Sitting Balance ?Static Sitting - Balance Support: No upper extremity supported;Feet unsupported ?Static Sitting - Level of Assistance: 5: Stand by  assistance ?Dynamic Sitting Balance ?Dynamic Sitting - Balance Support: No upper extremity supported;Feet supported;During functional activity ?Dynamic Sitting - Level of Assistance: 5: Stand by assistance ?Static Standing Balance ?Static Standing - Balance Support: Bilateral upper extremity supported;During functional activity ?Static Standing - Level of Assistance: 4: Min assist ?Dynamic Standing Balance ?Dynamic Standing - Balance Support: Bilateral upper extremity supported;During functional activity ?Dynamic Standing - Level of Assistance: 4: Min assist ?Extremity Assessment   ?RLE Assessment ?RLE Assessment: Exceptions to [facility] ?General Strength Comments: Grossly 4-/5 ?LLE Assessment ?LLE Assessment: Exceptions to [facility] ?General Strength Comments: Grossly 4-/5 ? ? ? ? ?Excell Seltzer, PT, DPT, CSRS ?01/07/2022, 10:33 AM ?

## 2022-01-04 NOTE — Progress Notes (Signed)
PROGRESS NOTE   Subjective/Complaints:  Pt reports didn't sleep well- nursing coming in so much.  [name] feels a little irritable and having pain.    XID:[id number] due to behavior/cognition/sedation  Objective:   No results found. Recent Labs    01/03/22 0511  WBC 9.9  HGB 7.8*  HCT 24.7*  PLT 290   Recent Labs    01/03/22 0511  NA 141  K 4.2  CL 110  CO2 20*  GLUCOSE 102*  BUN 40*  CREATININE 2.70*  CALCIUM 9.6   No intake or output data in the 24 hours ending 01/04/22 0824    Pressure Injury 12/04/21 Head Posterior Unstageable - Full thickness tissue loss in which the base of the injury is covered by slough (yellow, tan, gray, green or brown) and/or eschar (tan, brown or black) in the wound bed. 3 cm round, scabbed area to po (Active)  12/04/21 2000  Location: Head  Location Orientation: Posterior  Staging: Unstageable - Full thickness tissue loss in which the base of the injury is covered by slough (yellow, tan, gray, green or brown) and/or eschar (tan, brown or black) in the wound bed.  Wound Description (Comments): 3 cm round, scabbed area to posterior head.  Present on Admission: Yes     Physical Exam: Vital Signs Blood pressure (!) 143/70, pulse 96, temperature 98.2 F (36.8 C), temperature source Oral, resp. rate 18, height 5' 5"  (1.651 m), weight 63.6 kg, SpO2 100 %.         General: awake, alert, appropriate, just woke up so sleepy; laying on L side in bed away from door; NAD HENT: conjugate gaze; oropharynx moist CV: regular rhythm; borderline tachycardic  rate; no JVD Pulmonary: CTA B/L; no W/R/R- good air movement GI: soft, NT, ND, (+)BS Psychiatric: appropriate Neurological: sleepy; Ox2 Ext: no clubbing, cyanosis, or edema Psych: pleasant and cooperative, sl anxious Neuro: alert, confused, decreased insight and awareness but able to follow basic commands and participate in  conversation Bilateral lower extremities: LE 3/5 HF/KE R foot drop- at rest in PF Skin: unstageable wound with scabbing on back of head 3-4cm in diameter- also has popped/dry blister on L sternum- slough gone from base- head wound is smaller scabbing than prior- is now 2-3 cm in diameter  Assessment/Plan: 1. Functional deficits which require 3+ hours per day of interdisciplinary therapy in a comprehensive inpatient rehab setting. Physiatrist is providing close team supervision and 24 hour management of active medical problems listed below. Physiatrist and rehab team continue to assess barriers to discharge/monitor patient progress toward functional and medical goals  Care Tool:  Bathing    Body parts bathed by patient: Right arm, Left arm, Chest, Abdomen, Front perineal area, Buttocks, Right upper leg, Left upper leg, Face   Body parts bathed by helper: Front perineal area, Buttocks, Right lower leg, Left lower leg     Bathing assist Assist Level: Minimal Assistance - Patient > 75%     Upper Body Dressing/Undressing Upper body dressing   What is the patient wearing?: Pull over shirt    Upper body assist Assist Level: Set up assist    Lower Body Dressing/Undressing Lower body dressing  What is the patient wearing?: Pants     Lower body assist Assist for lower body dressing: Moderate Assistance - Patient 50 - 74%     Toileting Toileting Toileting Activity did not occur (Clothing management and hygiene only): N/A (no void or bm)  Toileting assist Assist for toileting: Moderate Assistance - Patient 50 - 74%     Transfers Chair/bed transfer  Transfers assist     Chair/bed transfer assist level: Moderate Assistance - Patient 50 - 74%     Locomotion Ambulation   Ambulation assist   Ambulation activity did not occur: Safety/medical concerns  Assist level: Moderate Assistance - Patient 50 - 74% Assistive device: Walker-rolling Max distance: 20'   Walk 10  feet activity   Assist  Walk 10 feet activity did not occur: Safety/medical concerns  Assist level: Moderate Assistance - Patient - 50 - 74% Assistive device: Walker-rolling   Walk 50 feet activity   Assist Walk 50 feet with 2 turns activity did not occur: Safety/medical concerns         Walk 150 feet activity   Assist Walk 150 feet activity did not occur: Safety/medical concerns         Walk 10 feet on uneven surface  activity   Assist Walk 10 feet on uneven surfaces activity did not occur: Safety/medical concerns         Wheelchair     Assist Is the patient using a wheelchair?: Yes Type of Wheelchair: Manual    Wheelchair assist level: Supervision/Verbal cueing Max wheelchair distance: 72'    Wheelchair 50 feet with 2 turns activity    Assist        Assist Level: Supervision/Verbal cueing   Wheelchair 150 feet activity     Assist      Assist Level: Dependent - Patient 0%   Blood pressure (!) 143/70, pulse 96, temperature 98.2 F (36.8 C), temperature source Oral, resp. rate 18, height 5' 5"  (1.651 m), weight 63.6 kg, SpO2 100 %.  Medical Problem List and Plan: 1. Functional deficits secondary to debility related to VF cardiac arrest secondary to massive pulmonary emboli status post mechanical thrombectomy            D/C SNF once medically appropriate Will d/w SW that pt will be done with IV ABX on 3/8- need to figure out the nutrition aspect- will d/w daughter again- only eating 30-40% of total calories/day-  Con't CIR- PT, OT and SLP- trying to place- family filed appeal to not d/c, however pt stable for d/c- Cortrak out- family refuses PEG as well as pt, so cannot fix the appetite issues- and most appetite stimulants increase sedation- on Megace.   Con't CIR- pt wants to go home? [name] ot take home- has til noon to file another appeal?  2.  Antithrombotics: -DVT/anticoagulation:  Pharmaceutical: Other (comment) Eliquis- has to use  due to saddle PE 2/23- stopped due to plts by Heme/Onc  3/1- restarted Eliquis at 2.5 mg BID  3/6- changed to 5 mg BID since plts back up.              -antiplatelet therapy: N/A 3. Pain Management: Neurontin 100 mg twice daily, oxycodone as needed  Stopped gabapentin due ot sedation  3/2- pain controlled when takes pain meds 4. Mood anxiety-  Cymbalta 20 mg daily, Xanax 0.25 mg daily as needed provide emotional support  2/21- change to Paxil 20 mg nightly- 2/24- no side effects noted per pt- flat, but looks less  anxious  3/1- increased Paxil to 40 mg nightly for continued anxiety             -antipsychotic agents: N/A 5. Neuropsych: This patient is not capable of making decisions on her own behalf. 6. Skin/Wound Care: Routine skin checks 7. Fluids/Electrolytes/Nutrition: still not eating much  -continue TF for now.   -hopefully po intake will pick up as mentation improves.   3/7- pt and [name] doesn't want PEG- trying to avoid- will d/c TF's for now and see if that makes pt hungrier.   3/8- suggested removing Cortrak/stop TF's- and see if pt eats more? Family emphatic doesn't want PEG, so really don't have a choice if going to SNF.   3/9- con't to encourage pt to eat/take supplements- Cortrak out.   3/10- pt still not eating her needed caloric intake- but Cortrak out and family/pt [place] doesn't want PEG 8.  Pericasular hepatic /hemorrhagic shock/acute blood loss anemia/status post left liver lobe embolization  Hgb down again to 7.7 repeat non contrast CT abd no change in pericapsular hepatic bleed , check stool OB  2/24- Hb 8.0- will con't to monitor  2/28- Hb down slightly to 7.5  3/1- will recheck in AM  3/2- Hb up to 7.8  3/6- Hb 7.5 again- slowly trending down  3/9- Hb 7.8-  9.  AKI secondary to ATN.  Follow-up per renal services.  Started clip for AKI- not ESRD, however Cr almost 5 today- getting HD tomorrow 2/8.   2/9- last Cr 5.74- up from 4.97 up from 4.45- rising- per renal-  likely HD tomorrow?.  2/24- HD catheter removed due to bacteremia- renal following for possible more HD Appreciate nephro recs 3/3- no more HD per renal- still has Cr ~ 3.2- likely this is new baseline 3/6- Cr down to 3.02- stable 3/9- Cr 2.70 and Bun 40- doing better, actually. .  10.  Thrombocytopenia.  Suspect ITP.  Follow-up per hematology 2/21- Plts were 214- down to 17k- will transfuse and cal Heme/Onc- 2/22- plts up to 27k- this Am after transfusion- Heme/Onc not sure why drop in plts- however said no transfusion unless plts drop<10k  2.23- plts 33k this AM- doing better 2/24- Plts 46k- off Eliquis per Heme/Onc- had saddle PE- but risks/benefits require her ot be off Eliquis 2/25 platelets 57, continue to monitor 2/28- plts up to 99k- Heme/Onc- said can start Eliquis- will start 2.5 mg BID- will start tomorrow 3/2- plts up to 129k- doing much better  3/6- Plts up to 201k-   3/9- plts 290k 11.  Hyperlipidemia.  Lipitor 12. Diabetes- Poor appetite- has Cortrak for intake due to poor appetite-stop insulin- was on at home- levemir 4 units BID when came to rehab, but dropping CBGs- so will hold- will do CBGs BID Will assess if still needs- d/c insulin due to Tfs  2/11- TF's restarted so CBGs going up- 486 this AM- restarted Levemir 4 units BID and SSI- will titrate as required.    CBG (last 3)  Recent Labs    01/03/22 2106 01/03/22 2355 01/04/22 0429  GLUCAP 119* 111* 88  Increased Levimir to 10U q12h  Changed to moderate SSI q 4h since oral intake is low   Levemir at 14 units BID  2/22- Cbgs still really elevated in 200s-300s- will add Novolog coverage per Dm coordinator 3 units at 8pm, midnight and 4am-   3/7- stopping TF's- wont make changes yet- except d/c insulin with TF's at night  3/9- CBGs looks  good- low 100s after reduction in Levemir  3/10- doing better on current regimen 13. New onset confusion/poor memory- anoxia? From prolonged CPR  2/9- will check CT of head- and  stop Gabapentin for nerve pain due to increased sedation- had before but so out of it per daughter- much worse last 2 days.   2/15- variable -tangential and  poor memory- will need to d/w daughter  2/21- still confused, but acutely medically ill still- is improving  2/24- poor memory due to prolonged CPR initially, however is doing better- 14. ABLA with chronic anemia  2/13 hb stable at 8.2  2/21- Hb stable at 8.0  2/25-hemoglobin 7.4, continue to monitor 15. Scab on back of head-pressure ulcer unstageable/Blister on sternum   2/24- scab unstageable on back of head and blister almost healed on L breast.  16. HTN with hx of VF arrest  2/10- pt's BP 178/95- running 832P/498Y- systolic- will d/w renal since not on any BP meds-   2/11- Started norvasc and BP looks better 641 systolic- will con't to monitor trend.  Vitals:   01/03/22 2001 01/04/22 0432  BP: 127/69 (!) 143/70  Pulse: 92 96  Resp: 16 18  Temp: 98.6 F (37 C) 98.2 F (36.8 C)  SpO2: 100% 100%   3/7- BP usually controlled- con't to monitor for trend 17. Constipation  2/24- LBM last night- con't to monitor 18. Leukocytosis-UTI/Bacteremia- Klebsiella and E Fecalis   2/15- will remove foley and bladder scan; also check U/A and Cx- foley was per renal .   2/17- pt has UTI- started on Cefepime yesterday- also WBC up to 17k  2/18 WBC down to 13.2K , recheck in am   2/20- WBC up to 18.3k- due to HD catheter and no improvement- actually worse, d/w ID and pharmacy- agreed to change cefepime to rocephin- also start Vanc and check blood Cx's.   2/21- changed to Ampicillin with ID discussion- off Vanc due to E fecalis   2/22- WBC still 16.4- will check daily fo rnow  2/23- WBC 14.2- coming down  2/24- WBC 14.5- Neutrophils 70% so not a left shift- stable-   2/25-WBC 12.8, improving  2/27- labs in AM- cannot get TEE inpt due to thrombocytopenia per Cards- still on Ampicillin-  2/28- WBC 13.4- down from 14.9- con't regimen- will check  ESR and CRP- will check another set of blood Cx's   3/1- ESR 79 and CRP 0.8 3/2- WBC 11.9- doing better  3/6- WBC 11.8- stable- don't see another cause of elevated WBC.   3/8- labs again tomorrow 3/9- WBC down to 9.9- doing much bette-r off IV ABX.  19. Fall  2/16- pt on floor last night- denied pain- CT head was ordered - pt refused to keep on mask to go downstairs- will get this AM.  3/1- fell last night- no injuries  3/2- moved closer to desk 20. Poor appetite-  2/17- will get PEG placed next week so can remove Cortrak- spoke with PA to get ordered  2/20- order in, but cannot do when ill/sick/  2/21- wait on PEG- con't cortrak  2/22- pt eating entire banana this AM- which is new  2/27- con't Cortrak/TF's at night-  2/28- placed nutrition consult- to see if can not do PEG/ can stop TFs' ? Wait to remove Cortrak. Pt doesn't want PEG_ will d/ PEG order for now- and see if pt can eat more.   3/1- calorie count in progress  3/4 intake 10-30%, discussion re: PEG  per Dr. Dagoberto Ligas and family/pt.   3/6- will d/w daughter- really either needs PEG or agree she will dwindle.   3/7- daughter/pt don't want PEG- will stop TF's and see if she eats more- daughter to bring in food from home- will add Ambesol for roof of mouth soreness with meals 21. Anxiety  2/27- appears worse this AM- will ask nursing to give AM anxiety meds  3/1- increased Paxil to 40 mg nightly 22. Mass on liver?  2/28- needs outpt w/u with Heme/Onc.  23. Dispo  2/28- cannot get Medical POA so interim guardianship- [name] 24. hyperkalemia  2/28- will start [place]- put in by Nephrology  3/1- K+ stilll 5.4- will stop bananas and OJ in tray- and con't [place]- recheck labs in AM  3/3- will recheck labs Monday -wrote for no bananas.   3/6- K+ 4.8-   3/7- explained to daughter that bananas cause her potassium to go up  3/9- K+ 4.2- much better         *Per ID note, will need IV ABX til 3/8- unless leaves, then can  switch to renally dose Amoxicillin  Get 2 sets of blood C'x-s 1 week after end of ABX- ~ 3/15 for surveillance.   LOS: 31 days A FACE TO FACE EVALUATION WAS PERFORMED  Adayah Arocho 01/04/2022, 8:24 AM

## 2022-01-04 NOTE — Progress Notes (Signed)
Occupational Therapy Session Note ? ?Patient Details  ?Name: [name] ?MRN: [id number] ?Date of Birth: [birth year] ? ?Today's Date: 01/04/2022 ?OT Individual Time: 2706-2376 ?OT Individual Time Calculation (min): 68 min  ? ? ?Short Term Goals: ?Week 4:  OT Short Term Goal 1 (Week 4): STG=LTG 2/2 ELOS ? ?Skilled Therapeutic Interventions/Progress Updates:  ?Pt greeted supine in bed  agreeable to OT intervention. Session focus on BADL reeducation, functional mobility, dynamic standing balance, attention to task, overall awareness and decreasing overall caregiver burden.  Pt completed supine>sit with supervision. Pt becomes emotional that she couldn't find her glasses however OTA located them on table and handled them to her, pt took several minutes to whisper to herself seeming to be "thanking God." Pt sat EOB to eat breakfast, pt continues to be confused noted to pour her apple juice in her oatmeal, pt reports she knew what she was doing and does this all the time. Pt needed MAX cues to focus on task of eating breakfast distracted by her phone or any  extra items on tray. Pt returned self to supine needing a "break." RT retrieved cereal from nurses station as pt did not eat the apple juice oatmeal. Pt required MAX encouragement to sit back up and eat ceral from EOB. Pt again getting distracted during self feeding tasks needing visual timer from clock to attend to task for 3 mins with no interruptions. Pt did well with visual timer able to eat all of her cereal. Pt completed dressing from EOB with pt donning [place] shirt with set- up assist. Pt needed MAX A to don pants anf brief because pt preffered to don from bed level. Pt completed sit>stands during session with MIN - MODA, when pt able to recall hand placement pt able to stand with MIN A, MOD A when pt pulls on RW and leans backwards. Pt pivoted to recliner with rw and MIN A to pivot.  pt left seated in recliner with all needs within reach and alarm belt  activated.                     ? ? ?Therapy Documentation ?Precautions:  ?Precautions ?Precautions: Fall, Other (comment) ?Precaution Comments: cortrak; right leg discomfort - PTA ?Restrictions ?Weight Bearing Restrictions: No ? ?Pain: no pain reported during session  ? ? ? ?Therapy/Group: Individual Therapy ? ?[name] ?01/04/2022, 9:08 AM ?

## 2022-01-04 NOTE — Progress Notes (Signed)
Physical Therapy Session Note ? ?Patient Details  ?Name: [name] ?MRN: [id number] ?Date of Birth: 1951-08-25 ? ?Today's Date: 01/04/2022 ?PT Individual Time: 3468-8737 ?PT Individual Time Calculation (min): 70 min  ? ?Short Term Goals: ?Week 5:  PT Short Term Goal 1 (Week 5): =LTG due to ELOS ? ?Skilled Therapeutic Interventions/Progress Updates: Pt presented in bed on phone with sister. Pt aware that this therapist in room to work with pt and was able to end call appropriately. Pt states did not feel hungry when asked as lunch tray had not been touched. Pt agreeable to try to eat at end of session as not hungry at this time. Pt performed bed mobility with supervision and use of bed features. Pt donned shoes with minA from PTA for time management. Pt with DME in room. PTA adjusted RW and performed ambulatory transfer to w/c with CGA. Pt required cues throughout session for hand placement for transfers as pt would try to pull up on RW however when pushing from stable surface was consistently CGA. Pt donned bonnet and mast with set up but with increased time. Pt transported to ortho gym and performed car transfer with minA. Pt required assist when transferring out of car as was distracted and unable to follow directions to safe hand placement. Pt ambulated ~37f to w/c with CGA but required extended rest after ambulation. Pt then indicated need for bathroom. Pt transported back to room and performed stand pivot transfer with wall rail for time management. Pt was able to pull clothes over hips and was CGA for transfer to toilet (+ urinary void/BM). Pt required assist for clothing management upon standing and as pt ambulating back to w/c stating felt "dizzy". Pt transferred from w/c to bed and performed stand pivot transfer to bed with RW and CGA. Pt then requesting to eat some food and was able to eat x 1 piece of chicken. Vitals checked 139/79 (95) HR 106. Pt encouraged to eat more however stating she "wanted to  let her stomach settle". Pt returned to supine with supervision and left with bed alarm on, call bell within reach and current needs met.  ?   ? ?Therapy Documentation ?Precautions:  ?Precautions ?Precautions: Fall, Other (comment) ?Precaution Comments: cortrak; right leg discomfort - PTA ?Restrictions ?Weight Bearing Restrictions: No ?General: ?  ?Vital Signs: ?Therapy Vitals ?Temp: 98.7 ?F (37.1 ?C) ?Pulse Rate: 96 ?Resp: 18 ?BP: (!) 114/59 ?Patient Position (if appropriate): Lying ?Oxygen Therapy ?SpO2: 100 % ?O2 Device: Room Air ?Pain: ?Pain Assessment ?Pain Score: 4  ?  ?Other Treatments:   ? ? ? ?Therapy/Group: Individual Therapy ? ?[name] ?01/04/2022, 4:25 PM  ?

## 2022-01-04 NOTE — Progress Notes (Addendum)
Patient ID: [name], female   DOB: [birth year], 71 y.o.   MRN: [id number] ? ?SW left message for [name] [phone]) to request [facility] preference. SW waiting on follow-up.  ?*SW spoke with [name] who reported preferred [facility] is [facility] or [facility].  ? ?[facility] denied. Referral accepted by [facility].  ? ?[name], MSW, LCSWA ?Office: [phone] ?Cell: [phone] ?Fax: [phone]  ?

## 2022-01-04 NOTE — Consult Note (Signed)
Neuropsychological Consultation   Patient:   [name]   DOB:   04-10-1951  MR Number:  [id number]  Location:  [place] [employer] [id number] [place] [address] Dept: [place]: [phone]           Date of Service:   01/04/2022  Start Time:   9 AM End Time:   10 AM  Provider/Observer:  Ilean Skill, Psy.D.       Clinical Neuropsychologist       Billing Code/Service: [phone]  Chief Complaint:    [name] is a 71 year old female with history of asthma, diabetes, fibromyalgia, hypertension, hyperlipidemia and past history of smoking.  Patient presented on 10/28/2021 after presyncopal episode with daughter catching her and lowered her to the ground.  Patient lost consciousness right before EMS arrived.  Patient was in VF and had 20 minutes of CPR.  Patient received shock and epinephrine.  She coded twice in the ED requiring intubation for airway protection.  EKG junctional rhythm concerning for STEMI.  Patient showed pulmonary embolus.  Patient has had extended hospital stay.  Patient is continued to display symptoms consistent with anoxic brain injury although it is difficult to ascertain patient's clear level of premorbid functioning before this most recent event.  Reason for Service:  Patient was referred for neuropsychological consultation due to ongoing cognitive deficits with attempts to plan for discharge home and/or skilled nursing facility.  Below is the HPI for the current admission.  [name] 71 year old right-handed female with history of asthma, diabetes mellitus, fibromyalgia, hypertension, hyperlipidemia.  She quit smoking 11 years ago.  Presented 10/28/2021 after a presyncopal episode at home where her daughter lowered her to the ground.  She regained some consciousness and told her daughter she felt like her blood glucose was low.  She lost consciousness right  before EMS arrived.  EMS arrived she was VF and had 20 minutes of CPR.  She received shock, amiodarone and epinephrine.  She coded twice in the ED requiring intubation for airway protection.  She required frequent pushes of epinephrine for bradycardia and hypotension.  EKG junctional rhythm concerning for STEMI.  Angiogram of the chest showed saddle pulmonary embolus extending into the segmental and subsegmental levels in the left lower lobe in all 3 right lobes.  Associated Right heart strain.  Vascular ultrasound lower extremities negative for DVT.  She did require mechanical thrombectomy for pulmonary emboli.  Patient was slowly weaned from pressors.  She was extubated 11/06/2021.  On 11/08/2021 MRI abdomen showed a large area of intrahepatic hemorrhage and she did require multiple transfusions and underwent embolization 10/31/2021.  Nephrology consulted for renal failure secondary to ATN and hemodialysis initiated and awaiting plan for long-term hemodialysis.  Bouts of thrombocytopenia suspect ITP and was getting Nplate prn and Dex.  With latest hemoglobin 7.8 platelet count improved to 91,000.  She was cleared to begin Eliquis for history of pulmonary emboli.  Therapy evaluations completed due to patient decreased functional mobility was admitted for a comprehensive rehab program.  Current Status:  Patient was sitting in her recliner chair with safety Velcro straps placed.  Patient with clear cognitive deficits and confusion ongoing consistent with anoxic brain injury and potential other etiological factors.  While patient was aware she was in the hospital and was able to identify year with effort she was unable to give a clear description of what has happened to her etc.  Ongoing cognitive deficits including new learning deficits were noted.  Patient denied significant mood disorder although she did of the appear to be dysphoric and frustrated throughout.  Patient did not display level of cognitive functioning  needed to function independently and she is clearly incompetent to make major medical and life decisions and will need support from family members.  Patient is not in a status cognitively where she could be discharged home without significant family support and if that is not available she would require skilled nursing care.  The event happened at the very beginning of January and with continued cognitive deficits consistent with anoxic brain injury the patient is very likely to have very slow and potentially limited recovery of cognitive functioning beyond the level she is already achieved.  Behavioral Observation: [name]  presents as a 71 y.o.-year-old Right African American Female who appeared her stated age. her dress was Appropriate and she was Well Groomed and her manners were Appropriate to the situation.  her participation was indicative of Inattentive and confused  behaviors.  There were physical disabilities noted.  she displayed an inappropriate level of cooperation and motivation.     Interactions:    Minimal Inattentive  Attention:   abnormal and attention span appeared shorter than expected for age  Memory:   abnormal; global memory impairment noted  Visuo-spatial:  not examined  Speech (Volume):  low  Speech:   normal; some word finding and verbal fluency changes noted  Thought Process:  Circumstantial and Disorganized  Though Content:  WNL; not suicidal and not homicidal  Orientation:   person and place  Judgment:   Poor  Planning:   Poor  Affect:    Irritable  Mood:    Dysphoric  Insight:   Shallow  Intelligence:   Patient currently has significant cognitive deficits related to attention including encoding deficits, information processing speed deficits and attentional shifting deficits as well as significant memory deficits.   Medical History:   Past Medical History:  Diagnosis Date   Anxiety    Arthritis    Asthma    Cardiac arrest ([place]) 10/28/2021    Cardiac arrest ([place]) 10/28/2021   Depression    Diabetes mellitus without complication (HCC)    Fibromyalgia    GERD (gastroesophageal reflux disease)    Hyperlipemia    Hypertension    PONV (postoperative nausea and vomiting)          Patient Active Problem List   Diagnosis Date Noted   Essential hypertension    Debility 12/04/2021   Anoxia of brain ([place]) 12/04/2021   Acute on chronic renal failure (HCC) 11/12/2021   Leukocytosis    Thrombocytopenia (HCC)    Aspiration pneumonia of both lower lobes (HCC)    Hemodialysis-associated hypotension    Acute respiratory failure ([place]) due to recurrent aspiration Pneumonia and mucous plugging/atelectasis     Acute saddle pulmonary embolism with acute cor pulmonale ([place])    Protein-calorie malnutrition, severe 11/08/2021   Cardiac arrest ([place]) 10/28/2021   Adjustment disorder with depressed mood 04/10/2021   History of infectious disease 04/10/2021   Affective psychosis ([place]) 11/21/2020   Allergic rhinitis due to pollen 11/21/2020   Anxiety 11/21/2020   Chronic kidney disease, stage 2 (mild) 11/21/2020   Diabetic renal disease ([place]) 11/21/2020   Diverticular disease of colon 11/21/2020   Eczema 11/21/2020   Gastroesophageal reflux disease 11/21/2020   Gout 11/21/2020   Joint pain 11/21/2020   Lymphedema 11/21/2020   Mild intermittent  asthma 11/21/2020   Moderate major depression, single episode ([place]) 11/21/2020   Neuropathy 11/21/2020   Type 2 diabetes mellitus with other specified complication ([place]) 29/93/7169   Pure hypercholesterolemia 11/21/2020   Vitamin D deficiency 11/21/2020   Fibromyalgia 09/04/2017   Insomnia 09/04/2017   DDD (degenerative disc disease), cervical 09/04/2017   DDD (degenerative disc disease), lumbar 09/04/2017   Primary osteoarthritis of both hands 09/04/2017   Primary osteoarthritis of both knees 09/04/2017   History of rotator cuff tear 09/04/2017   Osteopenia of multiple sites 09/04/2017    History of diabetes mellitus 09/04/2017   History of hypertension 09/04/2017   History of hypercholesterolemia 09/04/2017   Psychiatric History:  Patient with prior psychiatric history including depression and anxiety.  Family Med/Psych History:  Family History  Problem Relation Age of Onset   Diabetes Mother    Heart attack Father    Diabetes Sister    Throat cancer Brother    Hypertension Daughter     Risk of Suicide/Violence: low patient denied any suicidal or homicidal ideation.  Impression/DX:  [name] is a 71 year old female with history of asthma, diabetes, fibromyalgia, hypertension, hyperlipidemia and past history of smoking.  Patient presented on 10/28/2021 after presyncopal episode with daughter catching her and lowered her to the ground.  Patient lost consciousness right before EMS arrived.  Patient was in VF and had 20 minutes of CPR.  Patient received shock and epinephrine.  She coded twice in the ED requiring intubation for airway protection.  EKG junctional rhythm concerning for STEMI.  Patient showed pulmonary embolus.  Patient has had extended hospital stay.  Patient is continued to display symptoms consistent with anoxic brain injury although it is difficult to ascertain patient's clear level of premorbid functioning before this most recent event.  Patient was sitting in her recliner chair with safety Velcro straps placed.  Patient with clear cognitive deficits and confusion ongoing consistent with anoxic brain injury and potential other etiological factors.  While patient was aware she was in the hospital and was able to identify year with effort she was unable to give a clear description of what has happened to her etc.  Ongoing cognitive deficits including new learning deficits were noted.  Patient denied significant mood disorder although she did of the appear to be dysphoric and frustrated throughout.  Patient did not display level of cognitive functioning needed to  function independently and she is clearly incompetent to make major medical and life decisions and will need support from family members.  Patient is not in a status cognitively where she could be discharged home without significant family support and if that is not available she would require skilled nursing care.  The event happened at the very beginning of January and with continued cognitive deficits consistent with anoxic brain injury the patient is very likely to have very slow and potentially limited recovery of cognitive functioning beyond the level she is already achieved.  Disposition/Plan:  The patient displayed clear indications of cognitive deficits consistent with residual anoxic brain injury including significant attentional deficits, executive functioning deficits and memory deficits.  Patient is not competent to live independently currently and given the fact that we are now more than 2 months post cardiovascular event any progress cognitively and neuropsychologically going forward would likely be slow and minimal.  Diagnosis:    Patient with residual anoxic brain injury following cerebrovascular accident.       Electronically Signed   _______________________ Ilean Skill, Psy.D. Clinical Neuropsychologist

## 2022-01-04 NOTE — Progress Notes (Signed)
Speech Language Pathology Daily Session Note ? ?Patient Details  ?Name: [name] ?MRN: [id number] ?Date of Birth: [birth year] ? ?Today's Date: 01/04/2022 ?SLP Individual Time: 5852-7782 ?SLP Individual Time Calculation (min): 40 min ? ?Short Term Goals: ?Week 5: SLP Short Term Goal 1 (Week 5): STG=LTG due to awating SNF (longer than expected) ? ?Skilled Therapeutic Interventions: Skilled treatment session focused on cognitive goals. Upon arrival, patient was awake in bed. SLP facilitated session by having patient sit EOB to maximize arousal and attention. SLP encouraged PO intake with lunch meal but patient declined despite multiple attempts and max encouragement. Patient also declined all other snack options provided as well. Patient appeared confused today and consistently reporting she needed to make phone calls but was unable to verbalize to who or why. She also reported that her father-in-law was next door but she hadn't seen him yet today, SLP provided orientation information. Throughout session, Max verbal cues were needed for initiation and sustained attention to basic tasks. Patient requested to use the bathroom and was transferred via the [facility]. Extra time and Mod verbal cues were needed for problem solving with task. Patient was continent of bowel and bladder. Patient declined sitting up in the recliner despite encouragement. Patient transferred back to bed and left with alarm on and all needs within reach. Continue with current plan of care.  ?    ? ?Pain ?Pain Assessment ?Pain Score: 4  ? ?Therapy/Group: Individual Therapy ? ?Kayron Hicklin ?01/04/2022, 2:41 PM ?

## 2022-01-05 LAB — GLUCOSE, CAPILLARY
Glucose-Capillary: 139 mg/dL — ABNORMAL HIGH (ref 70–99)
Glucose-Capillary: 188 mg/dL — ABNORMAL HIGH (ref 70–99)
Glucose-Capillary: 162 mg/dL — ABNORMAL HIGH (ref 70–99)
Glucose-Capillary: 98 mg/dL (ref 70–99)

## 2022-01-05 LAB — LACTATE DEHYDROGENASE: LDH: 172 U/L (ref 98–192)

## 2022-01-05 MED ORDER — INSULIN ASPART 100 UNIT/ML IJ SOLN
0.0000 [IU] | Freq: Three times a day (TID) | INTRAMUSCULAR | Status: DC
  Administered 2022-01-05: 3 [IU] via SUBCUTANEOUS
  Administered 2022-01-05: 2 [IU] via SUBCUTANEOUS
  Administered 2022-01-05: 3 [IU] via SUBCUTANEOUS
  Administered 2022-01-06 – 2022-01-07 (×2): 2 [IU] via SUBCUTANEOUS

## 2022-01-06 LAB — GLUCOSE, CAPILLARY
Glucose-Capillary: 93 mg/dL (ref 70–99)
Glucose-Capillary: 118 mg/dL — ABNORMAL HIGH (ref 70–99)
Glucose-Capillary: 145 mg/dL — ABNORMAL HIGH (ref 70–99)
Glucose-Capillary: 162 mg/dL — ABNORMAL HIGH (ref 70–99)

## 2022-01-06 LAB — LACTATE DEHYDROGENASE: LDH: 172 U/L (ref 98–192)

## 2022-01-06 NOTE — Progress Notes (Signed)
Consult received to discontinue midline. Contacted unit and educated nurse that midlines can be removed by anyone with the competency to remove a PIV. Verbalized understanding. ?

## 2022-01-06 NOTE — Progress Notes (Signed)
PROGRESS NOTE   Subjective/Complaints:  Pt reports trying to eat breakfast, but being handed meds as well- makes it "hard to eat".   ROS: limited by cognition/behavior  Objective:   No results found. No results for input(s): WBC, HGB, HCT, PLT in the last 72 hours.  No results for input(s): NA, K, CL, CO2, GLUCOSE, BUN, CREATININE, CALCIUM in the last 72 hours.   Intake/Output Summary (Last 24 hours) at 01/06/2022 1433 Last data filed at 01/06/2022 0900 Gross per 24 hour  Intake 400 ml  Output 1 ml  Net 399 ml      Pressure Injury 12/04/21 Head Posterior Unstageable - Full thickness tissue loss in which the base of the injury is covered by slough (yellow, tan, gray, green or brown) and/or eschar (tan, brown or black) in the wound bed. 3 cm round, scabbed area to po (Active)  12/04/21 2000  Location: Head  Location Orientation: Posterior  Staging: Unstageable - Full thickness tissue loss in which the base of the injury is covered by slough (yellow, tan, gray, green or brown) and/or eschar (tan, brown or black) in the wound bed.  Wound Description (Comments): 3 cm round, scabbed area to posterior head.  Present on Admission: Yes     Physical Exam: Vital Signs Blood pressure 118/77, pulse 98, temperature 98.4 F (36.9 C), temperature source Oral, resp. rate 16, height 5' 5"  (1.651 m), weight 65.8 kg, SpO2 100 %.     General: awake, alert, appropriate, sitting up EOB trying to eat something, vs just moving it around; NAD HENT: conjugate gaze; oropharynx moist CV: regular/borderline tachycardic rate; no JVD Pulmonary: CTA B/L; no W/R/R- good air movement GI: soft, NT, ND, (+)BS Psychiatric: appropriate- flat; less irritable Neurological: less awake today; but overall alert  Ext: no clubbing, cyanosis, or edema Psych: pleasant and cooperative, sl anxious Neuro: alert, confused, decreased insight and awareness but  able to follow basic commands and participate in conversation Bilateral lower extremities: LE 3/5 HF/KE R foot drop- at rest in PF Skin: unstageable wound with scabbing on back of head 3-4cm in diameter- also has popped/dry blister on L sternum- slough gone from base- head wound is smaller scabbing than prior- is now 2-3 cm in diameter  Assessment/Plan: 1. Functional deficits which require 3+ hours per day of interdisciplinary therapy in a comprehensive inpatient rehab setting. Physiatrist is providing close team supervision and 24 hour management of active medical problems listed below. Physiatrist and rehab team continue to assess barriers to discharge/monitor patient progress toward functional and medical goals  Care Tool:  Bathing    Body parts bathed by patient: Right arm, Left arm, Chest, Abdomen, Front perineal area, Buttocks, Right upper leg, Left upper leg, Face   Body parts bathed by helper: Left lower leg     Bathing assist Assist Level: Minimal Assistance - Patient > 75%     Upper Body Dressing/Undressing Upper body dressing   What is the patient wearing?: Pull over shirt    Upper body assist Assist Level: Set up assist    Lower Body Dressing/Undressing Lower body dressing      What is the patient wearing?: Underwear/pull up, Pants  Lower body assist Assist for lower body dressing: Maximal Assistance - Patient 25 - 49% (bed level)     Toileting Toileting Toileting Activity did not occur (Clothing management and hygiene only): N/A (no void or bm)  Toileting assist Assist for toileting: Moderate Assistance - Patient 50 - 74%     Transfers Chair/bed transfer  Transfers assist     Chair/bed transfer assist level: Contact Guard/Touching assist     Locomotion Ambulation   Ambulation assist   Ambulation activity did not occur: Safety/medical concerns  Assist level: Contact Guard/Touching assist Assistive device: Walker-rolling Max distance: 52f    Walk 10 feet activity   Assist  Walk 10 feet activity did not occur: Safety/medical concerns  Assist level: Contact Guard/Touching assist Assistive device: Walker-rolling   Walk 50 feet activity   Assist Walk 50 feet with 2 turns activity did not occur: Safety/medical concerns         Walk 150 feet activity   Assist Walk 150 feet activity did not occur: Safety/medical concerns         Walk 10 feet on uneven surface  activity   Assist Walk 10 feet on uneven surfaces activity did not occur: Safety/medical concerns         Wheelchair     Assist Is the patient using a wheelchair?: Yes Type of Wheelchair: Manual    Wheelchair assist level: Supervision/Verbal cueing Max wheelchair distance: 586f   Wheelchair 50 feet with 2 turns activity    Assist        Assist Level: Supervision/Verbal cueing   Wheelchair 150 feet activity     Assist      Assist Level: Dependent - Patient 0%   Blood pressure 118/77, pulse 98, temperature 98.4 F (36.9 C), temperature source Oral, resp. rate 16, height 5' 5"  (1.651 m), weight 65.8 kg, SpO2 100 %.  Medical Problem List and Plan: 1. Functional deficits secondary to debility related to VF cardiac arrest secondary to massive pulmonary emboli status post mechanical thrombectomy            D/C SNF once medically appropriate Will d/w SW that pt will be done with IV ABX on 3/8- need to figure out the nutrition aspect- will d/w daughter again- only eating 30-40% of total calories/day-  Con't CIR- PT, OT and SLP- trying to place- family filed appeal to not d/c, however pt stable for d/c- Cortrak out- family refuses PEG as well as pt, so cannot fix the appetite issues- and most appetite stimulants increase sedation- on Megace.   Con't CIR- pt wants to go home? [place] take home- has til noon to file another appeal?  Con't CIR_ PT, OT and SLP- stable for d/c.  2.  Antithrombotics: -DVT/anticoagulation:   Pharmaceutical: Other (comment) Eliquis- has to use due to saddle PE 2/23- stopped due to plts by Heme/Onc  3/1- restarted Eliquis at 2.5 mg BID  3/6- changed to 5 mg BID since plts back up.              -antiplatelet therapy: N/A 3. Pain Management: Neurontin 100 mg twice daily, oxycodone as needed  Stopped gabapentin due ot sedation  3/2- pain controlled when takes pain meds 4. Mood anxiety-  Cymbalta 20 mg daily, Xanax 0.25 mg daily as needed provide emotional support  2/21- change to Paxil 20 mg nightly- 2/24- no side effects noted per pt- flat, but looks less anxious  3/1- increased Paxil to 40 mg nightly for continued anxiety  3/12- anxiety still an issue- would benefit from psychiatry outpt.              -antipsychotic agents: N/A 5. Neuropsych: This patient is not capable of making decisions on her own behalf. 6. Skin/Wound Care: Routine skin checks 7. Fluids/Electrolytes/Nutrition: still not eating much  -continue TF for now.   -hopefully po intake will pick up as mentation improves.   3/7- pt and Maria doesn't want PEG- trying to avoid- will d/c TF's for now and see if that makes pt hungrier.   3/8- suggested removing Cortrak/stop TF's- and see if pt eats more? Family emphatic doesn't want PEG, so really don't have a choice if going to SNF.   3/9- con't to encourage pt to eat/take supplements- Cortrak out.   3/10- pt still not eating her needed caloric intake- but Cortrak out and family/pt [place] doesn't want PEG 8.  Pericasular hepatic /hemorrhagic shock/acute blood loss anemia/status post left liver lobe embolization  Hgb down again to 7.7 repeat non contrast CT abd no change in pericapsular hepatic bleed , check stool OB  2/24- Hb 8.0- will con't to monitor  2/28- Hb down slightly to 7.5  3/12- labs in A 9.  AKI secondary to ATN.  Follow-up per renal services.  Started clip for AKI- not ESRD, however Cr almost 5 today- getting HD tomorrow 2/8.   2/9- last Cr 5.74- up from  4.97 up from 4.45- rising- per renal- likely HD tomorrow?.  2/24- HD catheter removed due to bacteremia- renal following for possible more HD Appreciate nephro recs 3/3- no more HD per renal- still has Cr ~ 3.2- likely this is new baseline 3/6- Cr down to 3.02- stable 3/9- Cr 2.70 and Bun 40- doing better, actually. .  10.  Thrombocytopenia.  Suspect ITP.  Follow-up per hematology 2/21- Plts were 214- down to 17k- will transfuse and cal Heme/Onc- 2/22- plts up to 27k- this Am after transfusion- Heme/Onc not sure why drop in plts- however said no transfusion unless plts drop<10k  2.23- plts 33k this AM- doing better 2/24- Plts 46k- off Eliquis per Heme/Onc- had saddle PE- but risks/benefits require her ot be off Eliquis 2/25 platelets 57, continue to monitor 2/28- plts up to 99k- Heme/Onc- said can start Eliquis- will start 2.5 mg BID- will start tomorrow 3/2- plts up to 129k- doing much better  3/6- Plts up to 201k-   3/9- plts 290k 11.  Hyperlipidemia.  Lipitor 12. Diabetes- Poor appetite- has Cortrak for intake due to poor appetite-stop insulin- was on at home- levemir 4 units BID when came to rehab, but dropping CBGs- so will hold- will do CBGs BID Will assess if still needs- d/c insulin due to Tfs  2/11- TF's restarted so CBGs going up- 486 this AM- restarted Levemir 4 units BID and SSI- will titrate as required.    CBG (last 3)  Recent Labs    01/05/22 2139 01/06/22 0639 01/06/22 1133  GLUCAP 98 93 118*  Increased Levimir to 10U q12h  Changed to moderate SSI q 4h since oral intake is low   Levemir at 14 units BID  2/22- Cbgs still really elevated in 200s-300s- will add Novolog coverage per Dm coordinator 3 units at 8pm, midnight and 4am-   3/7- stopping TF's- wont make changes yet- except d/c insulin with TF's at night  3/9- CBGs looks good- low 100s after reduction in Levemir  3/12- CBGs looking better- con't regimen 13. New onset confusion/poor memory- anoxia? From  prolonged  CPR  2/9- will check CT of head- and stop Gabapentin for nerve pain due to increased sedation- had before but so out of it per daughter- much worse last 2 days.   2/15- variable -tangential and  poor memory- will need to d/w daughter  2/21- still confused, but acutely medically ill still- is improving  2/24- poor memory due to prolonged CPR initially, however is doing better- 14. ABLA with chronic anemia  2/13 hb stable at 8.2  2/21- Hb stable at 8.0  2/25-hemoglobin 7.4, continue to monitor 15. Scab on back of head-pressure ulcer unstageable/Blister on sternum   2/24- scab unstageable on back of head and blister almost healed on L breast.  16. HTN with hx of VF arrest  2/10- pt's BP 178/95- running [id number]/[id number]- systolic- will d/w renal since not on any BP meds-   2/11- Started norvasc and BP looks better 510 systolic- will con't to monitor trend.  Vitals:   01/06/22 0639 01/06/22 1432  BP: 129/70 118/77  Pulse: 99 98  Resp: 18 16  Temp: 98.9 F (37.2 C) 98.4 F (36.9 C)  SpO2: 100% 100%   3/7- BP usually controlled- con't to monitor for trend 17. Constipation  2/24- LBM last night- con't to monitor 18. Leukocytosis-UTI/Bacteremia- Klebsiella and E Fecalis   2/15- will remove foley and bladder scan; also check U/A and Cx- foley was per renal .   2/17- pt has UTI- started on Cefepime yesterday- also WBC up to 17k  2/18 WBC down to 13.2K , recheck in am   2/20- WBC up to 18.3k- due to HD catheter and no improvement- actually worse, d/w ID and pharmacy- agreed to change cefepime to rocephin- also start Vanc and check blood Cx's.   2/21- changed to Ampicillin with ID discussion- off Vanc due to E fecalis   2/22- WBC still 16.4- will check daily fo rnow  2/23- WBC 14.2- coming down  2/24- WBC 14.5- Neutrophils 70% so not a left shift- stable-   2/25-WBC 12.8, improving  2/27- labs in AM- cannot get TEE inpt due to thrombocytopenia per Cards- still on Ampicillin-  2/28- WBC 13.4- down  from 14.9- con't regimen- will check ESR and CRP- will check another set of blood Cx's   3/1- ESR 79 and CRP 0.8 3/2- WBC 11.9- doing better  3/6- WBC 11.8- stable- don't see another cause of elevated WBC.   3/8- labs again tomorrow 3/9- WBC down to 9.9- doing much bette-r off IV ABX.  3/12- will need blood Cx's- around 3/15- per ID- however doesn't need to be in hospital to be done.  19. Fall  2/16- pt on floor last night- denied pain- CT head was ordered - pt refused to keep on mask to go downstairs- will get this AM.  3/1- fell last night- no injuries  3/2- moved closer to desk 20. Poor appetite-  2/17- will get PEG placed next week so can remove Cortrak- spoke with PA to get ordered  2/20- order in, but cannot do when ill/sick/  2/21- wait on PEG- con't cortrak  2/22- pt eating entire banana this AM- which is new  2/27- con't Cortrak/TF's at night-  2/28- placed nutrition consult- to see if can not do PEG/ can stop TFs' ? Wait to remove Cortrak. Pt doesn't want PEG_ will d/ PEG order for now- and see if pt can eat more.   3/1- calorie count in progress  3/4 intake 10-30%, discussion re: PEG per Dr.  Danai Gotto and family/pt.   3/6- will d/w daughter- really either needs PEG or agree she will dwindle.   3/7- daughter/pt don't want PEG- will stop TF's and see if she eats more- daughter to bring in food from home- will add Ambesol for roof of mouth soreness with meals 21. Anxiety  2/27- appears worse this AM- will ask nursing to give AM anxiety meds  3/1- increased Paxil to 40 mg nightly 22. Mass on liver?  2/28- needs outpt w/u with Heme/Onc.  23. Dispo  2/28- cannot get Medical POA so interim guardianship- [name] 24. hyperkalemia  2/28- will start [place]- put in by Nephrology  3/1- K+ stilll 5.4- will stop bananas and OJ in tray- and con't Lokelma- recheck labs in AM  3/3- will recheck labs Monday -wrote for no bananas.   3/6- K+ 4.8-   3/7- explained to daughter that bananas  cause her potassium to go up  3/9- K+ 4.2- much better  25. Dispo  3/12- d/c midline  I spent a total of 35   minutes on total care today- >50% coordination of care- due to d/w nursing and monitoring pt prolonged period.         *Per ID note, will need IV ABX til 3/8- unless leaves, then can switch to renally dose Amoxicillin  Get 2 sets of blood C'x-s 1 week after end of ABX- ~ 3/15 for surveillance.   LOS: 33 days A FACE TO FACE EVALUATION WAS PERFORMED  [name] 01/06/2022, 2:33 PM

## 2022-01-06 NOTE — Progress Notes (Signed)
Midline removed per Md orders. Patient tolerated well. No signs or symptoms of infection noted. Catheter noted intact once removed.  ? ? ? ?Yehuda Mao, LPN ?

## 2022-01-06 NOTE — Progress Notes (Signed)
Physical Therapy Session Note ? ?Patient Details  ?Name: [name] ?MRN: [id number] ?Date of Birth: March 30, 1951 ? ?Today's Date: 01/06/2022 ?PT Individual Time: 4098-1191 ?PT Individual Time Calculation (min): 40 min  ? ?Short Term Goals: ?Week 5:  PT Short Term Goal 1 (Week 5): =LTG due to ELOS ? ? ?Skilled Therapeutic Interventions/Progress Updates:  ?Pt presented in L side lying in bed and appeared oriented and engaged with PT. Pt reports that she planned on being D/C by this evening but PT confirmed with nurse that D/C would not be happening today. Pt did not directly deny therapy this date when asked to participate, but instead expressed that she needed to get her belongings together to prepare to go home. PT asked if pt would be willing to sit up in the bed and transfer to her W/C in order to do so. Pt agreed and performed supine>sitting edge of bed, STS from the bed, and transferred into W/C all with minA. Pt then propelled herself to her dresser to transfer her belongings from her the drawer to her bag while PT prepared bags for pt to use. Pt continued to pack clothes into her bag while seated upright in W/C while PT handed her items that were out of reach. Pt requested to return to the chair in her room after session. She transferred from her W/C to the armed chair using her walker with minA. Safety belt was donned and activated. Patient was left with all needs in reach. ? ?Therapy Documentation ?Precautions:  ?Precautions ?Precautions: Fall, Other (comment) ?Precaution Comments: cortrak; right leg discomfort - PTA ?Restrictions ?Weight Bearing Restrictions: No ?General: ?  ?Vital Signs: ?Therapy Vitals ?Temp: 98.4 ?F (36.9 ?C) ?Temp Source: Oral ?Pulse Rate: 98 ?Resp: 16 ?BP: 118/77 ?Patient Position (if appropriate): Lying ?Oxygen Therapy ?SpO2: 100 % ?O2 Device: Room Air ? ?Therapy/Group: Individual Therapy ? ?Lanetta Inch SPT ? ?01/06/2022, 5:35 PM  ?

## 2022-01-07 ENCOUNTER — Other Ambulatory Visit (HOSPITAL_COMMUNITY): Payer: Self-pay

## 2022-01-07 LAB — COMPREHENSIVE METABOLIC PANEL
ALT: 21 U/L (ref 0–44)
AST: 17 U/L (ref 15–41)
Albumin: 2.9 g/dL — ABNORMAL LOW (ref 3.5–5.0)
Alkaline Phosphatase: 67 U/L (ref 38–126)
Anion gap: 9 (ref 5–15)
BUN: 33 mg/dL — ABNORMAL HIGH (ref 8–23)
CO2: 21 mmol/L — ABNORMAL LOW (ref 22–32)
Calcium: 9.6 mg/dL (ref 8.9–10.3)
Chloride: 110 mmol/L (ref 98–111)
Creatinine, Ser: 2.65 mg/dL — ABNORMAL HIGH (ref 0.44–1.00)
GFR, Estimated: 18 mL/min — ABNORMAL LOW (ref >60)
Glucose, Bld: 103 mg/dL — ABNORMAL HIGH (ref 70–99)
Potassium: 3.6 mmol/L (ref 3.5–5.1)
Sodium: 140 mmol/L (ref 135–145)
Total Bilirubin: 0.4 mg/dL (ref 0.3–1.2)
Total Protein: 7.1 g/dL (ref 6.5–8.1)

## 2022-01-07 LAB — CBC WITH DIFFERENTIAL/PLATELET
Abs Immature Granulocytes: 0.04 10*3/uL (ref 0.00–0.07)
Basophils Absolute: 0.1 10*3/uL (ref 0.0–0.1)
Basophils Relative: 1 %
Eosinophils Absolute: 0.2 10*3/uL (ref 0.0–0.5)
Eosinophils Relative: 3 %
HCT: 25.2 % — ABNORMAL LOW (ref 36.0–46.0)
Hemoglobin: 8.3 g/dL — ABNORMAL LOW (ref 12.0–15.0)
Immature Granulocytes: 1 %
Lymphocytes Relative: 28 %
Lymphs Abs: 2.4 10*3/uL (ref 0.7–4.0)
MCH: 28.4 pg (ref 26.0–34.0)
MCHC: 32.9 g/dL (ref 30.0–36.0)
MCV: 86.3 fL (ref 80.0–100.0)
Monocytes Absolute: 0.8 10*3/uL (ref 0.1–1.0)
Monocytes Relative: 10 %
Neutro Abs: 4.9 10*3/uL (ref 1.7–7.7)
Neutrophils Relative %: 57 %
Platelets: 355 10*3/uL (ref 150–400)
RBC: 2.92 MIL/uL — ABNORMAL LOW (ref 3.87–5.11)
RDW: 13.9 % (ref 11.5–15.5)
WBC: 8.4 10*3/uL (ref 4.0–10.5)
nRBC: 0 % (ref 0.0–0.2)

## 2022-01-07 LAB — GLUCOSE, CAPILLARY
Glucose-Capillary: 95 mg/dL (ref 70–99)
Glucose-Capillary: 150 mg/dL — ABNORMAL HIGH (ref 70–99)

## 2022-01-07 LAB — LACTATE DEHYDROGENASE: LDH: 164 U/L (ref 98–192)

## 2022-01-07 MED ORDER — INSULIN DETEMIR 100 UNIT/ML FLEXPEN
11.0000 [IU] | PEN_INJECTOR | Freq: Two times a day (BID) | SUBCUTANEOUS | 11 refills | Status: DC
  Filled 2022-01-07: qty 9, 41d supply, fill #0

## 2022-01-07 MED ORDER — BLOOD GLUCOSE METER KIT: PACK | 0 refills | Status: DC

## 2022-01-07 MED ORDER — PANTOPRAZOLE SODIUM 40 MG PO TBEC
40.0000 mg | DELAYED_RELEASE_TABLET | Freq: Two times a day (BID) | ORAL | 0 refills | Status: DC
  Filled 2022-01-07: qty 60, 30d supply, fill #0

## 2022-01-07 MED ORDER — B COMPLEX-C PO TABS
1.0000 | ORAL_TABLET | Freq: Every day | ORAL | 0 refills | Status: DC
  Filled 2022-01-07: qty 30, 30d supply, fill #0

## 2022-01-07 MED ORDER — PAROXETINE HCL 40 MG PO TABS
40.0000 mg | ORAL_TABLET | Freq: Every day | ORAL | 0 refills | Status: DC
  Filled 2022-01-07: qty 30, 30d supply, fill #0

## 2022-01-07 MED ORDER — TEMAZEPAM 15 MG PO CAPS
ORAL_CAPSULE | ORAL | 0 refills | Status: DC
  Filled 2022-01-07: qty 20, 20d supply, fill #0

## 2022-01-07 MED ORDER — LIDOCAINE 5 % EX PTCH
2.0000 | MEDICATED_PATCH | CUTANEOUS | 0 refills | Status: DC
  Filled 2022-01-07: qty 30, 15d supply, fill #0

## 2022-01-07 MED ORDER — OXYCODONE HCL 5 MG PO TABS
5.0000 mg | ORAL_TABLET | ORAL | 0 refills | Status: DC | PRN
  Filled 2022-01-07: qty 30, 5d supply, fill #0

## 2022-01-07 MED ORDER — VITAMIN D (ERGOCALCIFEROL) 1.25 MG (50000 UNIT) PO CAPS
50000.0000 [IU] | ORAL_CAPSULE | ORAL | 0 refills | Status: AC
  Filled 2022-01-07: qty 5, 35d supply, fill #0

## 2022-01-07 MED ORDER — ATORVASTATIN CALCIUM 20 MG PO TABS
20.0000 mg | ORAL_TABLET | Freq: Every day | ORAL | 0 refills | Status: DC
  Filled 2022-01-07: qty 30, 30d supply, fill #0

## 2022-01-07 MED ORDER — BENZOCAINE 10 % MT GEL: Freq: Three times a day (TID) | OROMUCOSAL | 0 refills | Status: DC

## 2022-01-07 MED ORDER — LEVEMIR FLEXPEN 100 UNIT/ML ~~LOC~~ SOPN
11.0000 [IU] | PEN_INJECTOR | Freq: Two times a day (BID) | SUBCUTANEOUS | 11 refills | Status: DC
Start: 2022-01-07 — End: 2022-01-16
  Filled 2022-01-07: qty 15, 68d supply, fill #0

## 2022-01-07 MED ORDER — ALPRAZOLAM 0.25 MG PO TABS
0.2500 mg | ORAL_TABLET | Freq: Every day | ORAL | 0 refills | Status: DC
  Filled 2022-01-07: qty 30, 30d supply, fill #0

## 2022-01-07 MED ORDER — INSULIN PEN NEEDLE 32G X 4 MM MISC
0 refills | Status: DC
  Filled 2022-01-07: qty 100, 50d supply, fill #0

## 2022-01-07 MED ORDER — APIXABAN 5 MG PO TABS
5.0000 mg | ORAL_TABLET | Freq: Two times a day (BID) | ORAL | 0 refills | Status: DC
  Filled 2022-01-07: qty 60, 30d supply, fill #0

## 2022-01-07 MED ORDER — TEMAZEPAM 15 MG PO CAPS: ORAL_CAPSULE | ORAL | 0 refills | Status: DC

## 2022-01-07 MED ORDER — ACETAMINOPHEN 325 MG PO TABS: 650.0000 mg | ORAL_TABLET | ORAL | Status: AC | PRN

## 2022-01-07 MED ORDER — ALBUTEROL SULFATE HFA 108 (90 BASE) MCG/ACT IN AERS
1.0000 | INHALATION_SPRAY | RESPIRATORY_TRACT | 0 refills | Status: DC | PRN
  Filled 2022-01-07: qty 8.5, 25d supply, fill #0

## 2022-01-07 MED ORDER — BLOOD GLUCOSE MONITOR SYSTEM W/DEVICE KIT
PACK | 0 refills | Status: DC
Start: 2022-01-07 — End: 2022-02-07
  Filled 2022-01-07: qty 1, 30d supply, fill #0

## 2022-01-07 MED ORDER — DICYCLOMINE HCL 10 MG PO CAPS
10.0000 mg | ORAL_CAPSULE | Freq: Three times a day (TID) | ORAL | 0 refills | Status: DC
  Filled 2022-01-07: qty 70, 24d supply, fill #0

## 2022-01-07 MED ORDER — SODIUM ZIRCONIUM CYCLOSILICATE 5 G PO PACK: 5.0000 g | PACK | Freq: Every day | ORAL | Status: DC

## 2022-01-07 MED ORDER — ACCU-CHEK GUIDE VI STRP
ORAL_STRIP | 0 refills | Status: DC
  Filled 2022-01-07: qty 100, 25d supply, fill #0

## 2022-01-07 MED ORDER — ACCU-CHEK SOFTCLIX LANCETS MISC
0 refills | Status: DC
Start: 2022-01-07 — End: 2022-02-07
  Filled 2022-01-07: qty 100, 25d supply, fill #0

## 2022-01-07 MED ORDER — AMLODIPINE BESYLATE 10 MG PO TABS
10.0000 mg | ORAL_TABLET | Freq: Every day | ORAL | 0 refills | Status: DC
  Filled 2022-01-07: qty 30, 30d supply, fill #0

## 2022-01-07 MED ORDER — FOLIC ACID 1 MG PO TABS
2.0000 mg | ORAL_TABLET | Freq: Every day | ORAL | 0 refills | Status: DC
  Filled 2022-01-07: qty 60, 30d supply, fill #0

## 2022-01-07 NOTE — Progress Notes (Signed)
PROGRESS NOTE   Subjective/Complaints:  Pt reports a lot of people in room overnight so cannot sleep- refused OT this AM.  Missed 60 minutes of therapy.     ROS: limited by cognition/behavior  Objective:   No results found. Recent Labs    01/07/22 0526  WBC 8.4  HGB 8.3*  HCT 25.2*  PLT 355    Recent Labs    01/07/22 0526  NA 140  K 3.6  CL 110  CO2 21*  GLUCOSE 103*  BUN 33*  CREATININE 2.65*  CALCIUM 9.6     Intake/Output Summary (Last 24 hours) at 01/07/2022 0841 Last data filed at 01/06/2022 1322 Gross per 24 hour  Intake 380 ml  Output --  Net 380 ml      Pressure Injury 12/04/21 Head Posterior Unstageable - Full thickness tissue loss in which the base of the injury is covered by slough (yellow, tan, gray, green or brown) and/or eschar (tan, brown or black) in the wound bed. 3 cm round, scabbed area to po (Active)  12/04/21 2000  Location: Head  Location Orientation: Posterior  Staging: Unstageable - Full thickness tissue loss in which the base of the injury is covered by slough (yellow, tan, gray, green or brown) and/or eschar (tan, brown or black) in the wound bed.  Wound Description (Comments): 3 cm round, scabbed area to posterior head.  Present on Admission: Yes     Physical Exam: Vital Signs Blood pressure 133/75, pulse (!) 102, temperature 97.9 F (36.6 C), temperature source Oral, resp. rate 18, height 5' 5" (1.651 m), weight 65.8 kg, SpO2 100 %.      General: awake, alert, appropriate, curled up in ball- woke briefly but went back to sleep; NAD HENT: conjugate gaze; oropharynx moist CV: regular rate; no JVD Pulmonary: CTA B/L; no W/R/R- good air movement GI: soft, NT, ND, (+)BS Psychiatric: appropriate Neurological: alert, but sleepy- won't try to wake up   Ext: no clubbing, cyanosis, or edema Psych: pleasant and cooperative, sl anxious Neuro: alert, confused, decreased  insight and awareness but able to follow basic commands and participate in conversation Bilateral lower extremities: LE 3/5 HF/KE R foot drop- at rest in PF Skin: unstageable wound with scabbing on back of head 3-4cm in diameter- also has popped/dry blister on L sternum- slough gone from base- head wound is smaller scabbing than prior- is now 2-3 cm in diameter  Assessment/Plan: 1. Functional deficits which require 3+ hours per day of interdisciplinary therapy in a comprehensive inpatient rehab setting. Physiatrist is providing close team supervision and 24 hour management of active medical problems listed below. Physiatrist and rehab team continue to assess barriers to discharge/monitor patient progress toward functional and medical goals  Care Tool:  Bathing    Body parts bathed by patient: Right arm, Left arm, Chest, Abdomen, Front perineal area, Buttocks, Right upper leg, Left upper leg, Face   Body parts bathed by helper: Left lower leg     Bathing assist Assist Level: Minimal Assistance - Patient > 75%     Upper Body Dressing/Undressing Upper body dressing   What is the patient wearing?: Pull over shirt  Upper body assist Assist Level: Set up assist    Lower Body Dressing/Undressing Lower body dressing      What is the patient wearing?: Underwear/pull up, Pants     Lower body assist Assist for lower body dressing: Maximal Assistance - Patient 25 - 49% (bed level)     Toileting Toileting Toileting Activity did not occur (Clothing management and hygiene only): N/A (no void or bm)  Toileting assist Assist for toileting: Moderate Assistance - Patient 50 - 74%     Transfers Chair/bed transfer  Transfers assist     Chair/bed transfer assist level: Minimal Assistance - Patient > 75%     Locomotion Ambulation   Ambulation assist   Ambulation activity did not occur: Safety/medical concerns  Assist level: Contact Guard/Touching assist Assistive device:  Walker-rolling Max distance: 37f   Walk 10 feet activity   Assist  Walk 10 feet activity did not occur: Safety/medical concerns  Assist level: Contact Guard/Touching assist Assistive device: Walker-rolling   Walk 50 feet activity   Assist Walk 50 feet with 2 turns activity did not occur: Safety/medical concerns         Walk 150 feet activity   Assist Walk 150 feet activity did not occur: Safety/medical concerns         Walk 10 feet on uneven surface  activity   Assist Walk 10 feet on uneven surfaces activity did not occur: Safety/medical concerns         Wheelchair     Assist Is the patient using a wheelchair?: Yes Type of Wheelchair: Manual    Wheelchair assist level: Supervision/Verbal cueing Max wheelchair distance: 573f   Wheelchair 50 feet with 2 turns activity    Assist        Assist Level: Supervision/Verbal cueing   Wheelchair 150 feet activity     Assist      Assist Level: Dependent - Patient 0%   Blood pressure 133/75, pulse (!) 102, temperature 97.9 F (36.6 C), temperature source Oral, resp. rate 18, height 5' 5" (1.651 m), weight 65.8 kg, SpO2 100 %.  Medical Problem List and Plan: 1. Functional deficits secondary to debility related to VF cardiac arrest secondary to massive pulmonary emboli status post mechanical thrombectomy            D/C SNF once medically appropriate Will d/w SW that pt will be done with IV ABX on 3/8- need to figure out the nutrition aspect- will d/w daughter again- only eating 30-40% of total calories/day-  Con't CIR- PT, OT and SLP- trying to place- family filed appeal to not d/c, however pt stable for d/c- Cortrak out- family refuses PEG as well as pt, so cannot fix the appetite issues- and most appetite stimulants increase sedation- on Megace.   Con't CIR- pt wants to go home? [place] take home- has til noon to file another appeal?  Con't to encourage pt to participate in therapy- stable for  d/c.- Con't CIR  2.  Antithrombotics: -DVT/anticoagulation:  Pharmaceutical: Other (comment) Eliquis- has to use due to saddle PE 2/23- stopped due to plts by Heme/Onc  3/1- restarted Eliquis at 2.5 mg BID  3/6- changed to 5 mg BID since plts back up.              -antiplatelet therapy: N/A 3. Pain Management: Neurontin 100 mg twice daily, oxycodone as needed  Stopped gabapentin due ot sedation  3/2- pain controlled when takes pain meds 4. Mood anxiety-  Cymbalta 20  mg daily, Xanax 0.25 mg daily as needed provide emotional support  2/21- change to Paxil 20 mg nightly- 2/24- no side effects noted per pt- flat, but looks less anxious  3/1- increased Paxil to 40 mg nightly for continued anxiety  3/12- anxiety still an issue- would benefit from psychiatry outpt.              -antipsychotic agents: N/A 5. Neuropsych: This patient is not capable of making decisions on her own behalf. 6. Skin/Wound Care: Routine skin checks 7. Fluids/Electrolytes/Nutrition: still not eating much  -continue TF for now.   -hopefully po intake will pick up as mentation improves.   3/7- pt and [name] doesn't want PEG- trying to avoid- will d/c TF's for now and see if that makes pt hungrier.   3/8- suggested removing Cortrak/stop TF's- and see if pt eats more? Family emphatic doesn't want PEG, so really don't have a choice if going to SNF.   3/9- con't to encourage pt to eat/take supplements- Cortrak out.   3/10- pt still not eating her needed caloric intake- but Cortrak out and family/pt [place] doesn't want PEG 8.  Pericasular hepatic /hemorrhagic shock/acute blood loss anemia/status post left liver lobe embolization  Hgb down again to 7.7 repeat non contrast CT abd no change in pericapsular hepatic bleed , check stool OB  2/24- Hb 8.0- will con't to monitor  2/28- Hb down slightly to 7.5  3/12- labs in A 9.  AKI secondary to ATN.  Follow-up per renal services.  Started clip for AKI- not ESRD, however Cr almost 5  today- getting HD tomorrow 2/8.   2/9- last Cr 5.74- up from 4.97 up from 4.45- rising- per renal- likely HD tomorrow?.  2/24- HD catheter removed due to bacteremia- renal following for possible more HD Appreciate nephro recs 3/3- no more HD per renal- still has Cr ~ 3.2- likely this is new baseline 3/6- Cr down to 3.02- stable 3/9- Cr 2.70 and Bun 40- doing better, actually. .   3/13- Cr 2.65- and BUN 33-  10.  Thrombocytopenia.  Suspect ITP.  Follow-up per hematology 2/21- Plts were 214- down to 17k- will transfuse and cal Heme/Onc- 2/22- plts up to 27k- this Am after transfusion- Heme/Onc not sure why drop in plts- however said no transfusion unless plts drop<10k  2.23- plts 33k this AM- doing better 2/24- Plts 46k- off Eliquis per Heme/Onc- had saddle PE- but risks/benefits require her ot be off Eliquis 2/25 platelets 57, continue to monitor 2/28- plts up to 99k- Heme/Onc- said can start Eliquis- will start 2.5 mg BID- will start tomorrow 3/2- plts up to 129k- doing much better  3/6- Plts up to 201k-   3/9- plts 290k  3/13- Plts 355k 11.  Hyperlipidemia.  Lipitor 12. Diabetes- Poor appetite- has Cortrak for intake due to poor appetite-stop insulin- was on at home- levemir 4 units BID when came to rehab, but dropping CBGs- so will hold- will do CBGs BID Will assess if still needs- d/c insulin due to Tfs  2/11- TF's restarted so CBGs going up- 486 this AM- restarted Levemir 4 units BID and SSI- will titrate as required.    CBG (last 3)  Recent Labs    01/06/22 1629 01/06/22 2102 01/07/22 0607  GLUCAP 145* 162* 95  Increased Levimir to 10U q12h  Changed to moderate SSI q 4h since oral intake is low   Levemir at 14 units BID  2/22- Cbgs still really elevated in 200s-300s-  will add Novolog coverage per Dm coordinator 3 units at 8pm, midnight and 4am-   3/7- stopping TF's- wont make changes yet- except d/c insulin with TF's at night  3/9- CBGs looks good- low 100s after reduction in  Levemir  3/13- CBGs look good 13. New onset confusion/poor memory- anoxia? From prolonged CPR  2/9- will check CT of head- and stop Gabapentin for nerve pain due to increased sedation- had before but so out of it per daughter- much worse last 2 days.   2/15- variable -tangential and  poor memory- will need to d/w daughter  2/21- still confused, but acutely medically ill still- is improving  2/24- poor memory due to prolonged CPR initially, however is doing better- 14. ABLA with chronic anemia  2/13 hb stable at 8.2  2/21- Hb stable at 8.0  2/25-hemoglobin 7.4, continue to monitor 15. Scab on back of head-pressure ulcer unstageable/Blister on sternum   2/24- scab unstageable on back of head and blister almost healed on L breast.  16. HTN with hx of VF arrest  2/10- pt's BP 178/95- running 889V/694H- systolic- will d/w renal since not on any BP meds-   2/11- Started norvasc and BP looks better 038 systolic- will con't to monitor trend.  Vitals:   01/06/22 1925 01/07/22 0401  BP: 136/78 133/75  Pulse: 97 (!) 102  Resp: 18 18  Temp: 98.5 F (36.9 C) 97.9 F (36.6 C)  SpO2: 100% 100%   3/7- BP usually controlled- con't to monitor for trend 17. Constipation  2/24- LBM last night- con't to monitor 18. Leukocytosis-UTI/Bacteremia- Klebsiella and E Fecalis   2/15- will remove foley and bladder scan; also check U/A and Cx- foley was per renal .   2/17- pt has UTI- started on Cefepime yesterday- also WBC up to 17k  2/18 WBC down to 13.2K , recheck in am   2/20- WBC up to 18.3k- due to HD catheter and no improvement- actually worse, d/w ID and pharmacy- agreed to change cefepime to rocephin- also start Vanc and check blood Cx's.   2/21- changed to Ampicillin with ID discussion- off Vanc due to E fecalis   2/22- WBC still 16.4- will check daily fo rnow  2/23- WBC 14.2- coming down  2/24- WBC 14.5- Neutrophils 70% so not a left shift- stable-   2/25-WBC 12.8, improving  2/27- labs in AM-  cannot get TEE inpt due to thrombocytopenia per Cards- still on Ampicillin-  2/28- WBC 13.4- down from 14.9- con't regimen- will check ESR and CRP- will check another set of blood Cx's   3/1- ESR 79 and CRP 0.8 3/2- WBC 11.9- doing better  3/6- WBC 11.8- stable- don't see another cause of elevated WBC.   3/8- labs again tomorrow 3/9- WBC down to 9.9- doing much bette-r off IV ABX.  3/12- will need blood Cx's- around 3/15- per ID- however doesn't need to be in hospital to be done.   3/13- WBC 8.4-  19. Fall  2/16- pt on floor last night- denied pain- CT head was ordered - pt refused to keep on mask to go downstairs- will get this AM.  3/1- fell last night- no injuries  3/2- moved closer to desk 20. Poor appetite-  2/17- will get PEG placed next week so can remove Cortrak- spoke with PA to get ordered  2/20- order in, but cannot do when ill/sick/  2/21- wait on PEG- con't cortrak  2/22- pt eating entire banana this AM- which is new  2/27- con't Cortrak/TF's at night-  2/28- placed nutrition consult- to see if can not do PEG/ can stop TFs' ? Wait to remove Cortrak. Pt doesn't want PEG_ will d/ PEG order for now- and see if pt can eat more.   3/1- calorie count in progress  3/4 intake 10-30%, discussion re: PEG per Dr. Dagoberto Ligas and family/pt.   3/6- will d/w daughter- really either needs PEG or agree she will dwindle.   3/7- daughter/pt don't want PEG- will stop TF's and see if she eats more- daughter to bring in food from home- will add Ambesol for roof of mouth soreness with meals 21. Anxiety  2/27- appears worse this AM- will ask nursing to give AM anxiety meds  3/1- increased Paxil to 40 mg nightly 22. Mass on liver?  2/28- needs outpt w/u with Heme/Onc.  23. Dispo  2/28- cannot get Medical POA so interim guardianship- [name] 24. hyperkalemia  2/28- will start [place]- put in by Nephrology  3/1- K+ stilll 5.4- will stop bananas and OJ in tray- and con't Lokelma- recheck labs in  AM  3/3- will recheck labs Monday -wrote for no bananas.   3/6- K+ 4.8-   3/7- explained to daughter that bananas cause her potassium to go up  3/9- K+ 4.2- much better  25. Dispo  3/12- d/c midline        *Per ID note, will need IV ABX til 3/8- unless leaves, then can switch to renally dose Amoxicillin  Get 2 sets of blood C'x-s 1 week after end of ABX- ~ 3/15 for surveillance.   LOS: 34 days A FACE TO FACE EVALUATION WAS PERFORMED    01/07/2022, 8:41 AM

## 2022-01-07 NOTE — Progress Notes (Signed)
Inpatient Rehabilitation Discharge Medication Review by a Pharmacist  A complete drug regimen review was completed for this patient to identify any potential clinically significant medication issues.  High Risk Drug Classes Is patient taking? Indication by Medication  Antipsychotic No   Anticoagulant Yes Eliquis for PE  Antibiotic No   Opioid Yes   Antiplatelet No   Hypoglycemics/insulin Yes Levemir for DM  Vasoactive Medication Yes Norvasc for BP  Chemotherapy No   Other Yes Xanax, Paxil for mood Lipitor for HLD Protonix, Bentyl for Gerd     Type of Medication Issue Identified Description of Issue Recommendation(s)  Drug Interaction(s) (clinically significant)     Duplicate Therapy     Allergy     No Medication Administration End Date     Incorrect Dose     Additional Drug Therapy Needed     Significant med changes from prior encounter (inform family/care partners about these prior to discharge).    Other       Clinically significant medication issues were identified that warrant physician communication and completion of prescribed/recommended actions by midnight of the next day:  No  Pharmacist comments: None  Time spent performing this drug regimen review (minutes):  20 minutes   [name] BS, PharmD, BCPS Clinical Pharmacist 01/07/2022 7:20 AM

## 2022-01-07 NOTE — TOC Benefit Eligibility Note (Signed)
Patient Advocate Encounter ?  ?Received notification that prior authorization for Oxycodone 5 mg tablets is required. ?  ?PA submitted on 01/07/2022 ?Key [id number] ?Status is pending ?   ? ? ? ?Lyndel Safe, CPhT ?Pharmacy Patient Advocate Specialist ?[place] Patient Advocate Team ?Direct Number: [phone]  Fax: [phone]  ?

## 2022-01-07 NOTE — Progress Notes (Signed)
Inpatient Rehabilitation Care Coordinator ?Discharge Note  ? ?Patient Details  ?Name: [name] ?MRN: [id number] ?Date of Birth: [birth year] ? ? ?Discharge location: D/c to home with her dtr [name] ? ?Length of Stay: 33 days ? ?Discharge activity level: discharge at a wheelchair level Supervision and CGA to min A level for short distance ambulation ? ?Home/community participation: Limited ? ?Patient response GY:BWLSLH Literacy - How often do you need to have someone help you when you read instructions, pamphlets, or other written material from your doctor or pharmacy?: Rarely ? ?Patient response TD:SKAJGO Isolation - How often do you feel lonely or isolated from those around you?: Never ? ?Services provided included: MD, RD, PT, OT, SLP, CM, TR, Pharmacy, Neuropsych, SW, RN ? ?Financial Services:  ?Charity fundraiser Utilized: Private Insurance ?[employer] Medicare ? ?Choices offered to/list presented to: Yes ? ?Follow-up services arranged:  ?Home Health, DME ?Home Health Agency: [facility] for HHT/PT/OT/SLP/aide  ?  ?DME : [place] for RW, 3in1 BSC, and w/c ?  ? ?Patient response to transportation need: ?Is the patient able to respond to transportation needs?: Yes ?In the past 12 months, has lack of transportation kept you from medical appointments or from getting medications?: No ?In the past 12 months, has lack of transportation kept you from meetings, work, or from getting things needed for daily living?: No ? ?Comments (or additional information): ? ?Patient/Family verbalized understanding of follow-up arrangements:  Yes ? ?Individual responsible for coordination of the follow-up plan: contact pt dtr [name] ? ?Confirmed correct DME delivered: [name] 01/07/2022   ? ?[name] ?

## 2022-01-07 NOTE — Progress Notes (Signed)
Physical Therapy Session Note ? ?Patient Details  ?Name: [name] ?MRN: [id number] ?Date of Birth: 1951/04/09 ? ?Today's Date: 01/07/2022 ?PT Individual Time: 0945-1000 ?PT Individual Time Calculation (min): 15 min  ? ?Short Term Goals: ?Week 5:  PT Short Term Goal 1 (Week 5): =LTG due to ELOS ? ?Skilled Therapeutic Interventions/Progress Updates:  ?  Pt received sidelying in bed in her room. Per nursing report pt will d/c home with daughter today, awaiting arrival of her daughter to pick her up. Pt declines any OOB mobility this date due to pending d/c. Reviewed equipment that pt will d/c home with, no further questions. Pt prefers to remain in bed to wait for her daughter. Pt left in sidelying in bed with needs in reach, bed alarm in place. Pt missed 60 min of scheduled therapy session due to pending d/c and awaiting daughter's arrival. ? ?Therapy Documentation ?Precautions:  ?Precautions ?Precautions: Fall, Other (comment) ?Precaution Comments: cortrak; right leg discomfort - PTA ?Restrictions ?Weight Bearing Restrictions: No ?General: ?PT Amount of Missed Time (min): 60 Minutes ?PT Missed Treatment Reason: Patient unwilling to participate ? ? ? ? ? ?Therapy/Group: Individual Therapy ? ? ?Excell Seltzer, PT, DPT, CSRS ?01/07/2022, 10:23 AM  ?

## 2022-01-07 NOTE — Progress Notes (Shared)
Occupational Therapy Discharge Summary  Patient Details  Name: [name] MRN: [id number] Date of Birth: 22-Feb-1951  Patient has met 7 of 11 long term goals due to {due EH:2122482}.  Pt has made fluctuating progress during LOS as pt limited by impaired cognition, memory, awareness and impaired balance. Pts level of assist varies but currently pt requires MIN A for ADL transfers with MAX verbal cues for hand placment and sequencing. Pt requires set- up assist for UB ADLs and MAX A for LB ADLs. Pt needs increased time between transitions and pt presents with impaired functional endurance requiring rest breaks during mobility tasks. Pt easily distracted by extraneous stimuli with pt performing best in a quiet, non distracting environment. Patient to discharge at overall {LOA:3049010} level.  Patient's care partner {care partner:3041650} to provide the necessary {assistance:3041652} assistance at discharge.    Reasons goals not met: pt required MAX A for LB dressing d/t impaired balance. Pt needs at least MIN cues for memory tasks such as recalling day to day events and has trouble with emergent awareness as pt unaware of problems as they occur needing at least MIN cues to terminate an unsafe situation from occurring.   Recommendation:  Patient will benefit from ongoing skilled OT services in {setting:3041680} to continue to advance functional skills in the area of {ADL/iADL:3041649}.  Equipment: {equipment:3041657}  Reasons for discharge: {Reason for discharge:3049018}  Patient/family agrees with progress made and goals achieved: {Pt/Family agree with progress/goals:3049020}  OT Discharge Precautions/Restrictions  Precautions Precautions: Fall Precaution Comments: right leg discomfort Restrictions Weight Bearing Restrictions: No General PT Missed Treatment Reason: Patient unwilling to participate Vital Signs  Pain   ADL ADL Grooming: Setup Where Assessed-Grooming: Sitting at  sink Upper Body Bathing: Minimal assistance Where Assessed-Upper Body Bathing: Sitting at sink Lower Body Bathing: Maximal assistance Where Assessed-Lower Body Bathing: Sitting at sink Upper Body Dressing: Setup Where Assessed-Upper Body Dressing: Sitting at sink Lower Body Dressing: Maximal assistance Where Assessed-Lower Body Dressing: Sitting at sink Toileting: Moderate assistance Where Assessed-Toileting: Bedside Commode Toilet Transfer: Minimal assistance Toilet Transfer Method: Stand pivot Toilet Transfer Equipment: Geophysical data processor: Unable to assess ADL Comments: MIN - MAX A for ADL participation, pt limited by impaired balance, decreased activity tolerance, cognition and self limiting behaviors Vision Baseline Vision/History: 1 Wears glasses Patient Visual Report: No change from baseline Perception  Perception: Within Functional Limits Praxis Praxis: Impaired Praxis Impairment Details: Motor planning;Perseveration Cognition Overall Cognitive Status: Impaired/Different from baseline Arousal/Alertness: Awake/alert Orientation Level: Oriented to person Attention: Focused;Sustained Focused Attention: Appears intact Sustained Attention: Impaired Memory: Impaired Memory Impairment: Storage deficit;Decreased recall of new information Awareness: Impaired Awareness Impairment: Intellectual impairment Problem Solving: Impaired Behaviors: Restless;Impulsive;Perseveration;Poor frustration tolerance;Confabulation Safety/Judgment: Impaired Sensation Sensation Light Touch: Appears Intact Proprioception: Impaired by gross assessment Coordination Gross Motor Movements are Fluid and Coordinated: No Fine Motor Movements are Fluid and Coordinated: Yes Coordination and Movement Description: impaired 2/2 global weakness Motor  Motor Motor: Within Functional Limits Motor - Discharge Observations: Improved, varies depending on cognitive status Mobility  Bed  Mobility Bed Mobility: Rolling Right;Rolling Left;Supine to Sit;Sit to Supine Rolling Right: Independent Rolling Left: Independent Supine to Sit: Independent Sit to Supine: Independent Transfers Sit to Stand: Minimal Assistance - Patient > 75%  Trunk/Postural Assessment  Cervical Assessment Cervical Assessment: Within Functional Limits Thoracic Assessment Thoracic Assessment: Exceptions to [facility] (rounded shoulders) Lumbar Assessment Lumbar Assessment: Exceptions to [facility] (posterior pelvic tilt) Postural Control Postural Control: Deficits on evaluation Righting Reactions: Delayed but improved from eval  Protective Responses: Delayed but improved from eval  Balance Balance Balance Assessed: Yes Static Sitting Balance Static Sitting - Balance Support: No upper extremity supported;Feet unsupported Static Sitting - Level of Assistance: 5: Stand by assistance Dynamic Sitting Balance Dynamic Sitting - Balance Support: No upper extremity supported;Feet supported;During functional activity Dynamic Sitting - Level of Assistance: 5: Stand by assistance Static Standing Balance Static Standing - Balance Support: Bilateral upper extremity supported;During functional activity Static Standing - Level of Assistance: 4: Min assist Dynamic Standing Balance Dynamic Standing - Balance Support: Bilateral upper extremity supported;During functional activity Dynamic Standing - Level of Assistance: 4: Min assist Extremity/Trunk Assessment RUE Assessment RUE Assessment: Within Functional Limits LUE Assessment LUE Assessment: Within Functional Limits   [name] 01/07/2022, 1:02 PM

## 2022-01-07 NOTE — TOC Benefit Eligibility Note (Signed)
Patient Advocate Encounter ? ?Prior Authorization for Oxycodone 5 mg HCL tablets has been approved.   ? ?PA# [id number] ?Effective dates: 01/07/2022 through 10/27/2022 ? ? ? ? ? ?Lyndel Safe, CPhT ?Pharmacy Patient Advocate Specialist ?[facility] Patient Advocate Team ?Direct Number: [phone]  Fax: [phone]  ?

## 2022-01-07 NOTE — Progress Notes (Signed)
Alert and oriented, follows commands. Denies pain or discomfort. No signs or symptoms of distress noted. Compliant with medication administration. Patient/family education with Daughter [name] on how to perform CBG's and how to administer insulin. ?Medications reviewed and discharge instructions provided by Lauraine Rinne, PA-C. ?Ensured patient and daughter has education book provided by care coordinators and medications/prescription prior to leaving unit. Staff assisted patient off the unit into private car. Patient discharged with no issues. ? ? ?Harlow Ohms, LPN ?

## 2022-01-07 NOTE — Progress Notes (Signed)
Patient ID: [name], female   DOB: 1950/11/21, 71 y.o.   MRN: [id number] ? ?SW received fax from [employer] reporting that appeal submitted was denied and uphold original decision for pt to transition to lower level of care.  ? ?SW spoke with attending to inform on [facility] 12 letter that will be issued informing on pt discharged and pt will need to d/c today. SW updated medical team.  ? ?SW spoke with pt dtr [name] to discuss above. She reports [name] called her yesterday and made her aware. She reports she is on here was here now to pick up her mother. SW informed will review discharge when she arrives.  ?*SW reviewed d/c with pt dtr [name].  ? ?[name], MSW, LCSWA ?Office: [phone] ?Cell: [phone] ?Fax: [phone]  ?

## 2022-01-07 NOTE — Progress Notes (Signed)
Speech Language Pathology Discharge Summary ? ?Patient Details  ?Name: [name] ?MRN: [id number] ?Date of Birth: January 28, 1951 ? ? ?Patient has met 2 of 6 long term goals.  Patient to discharge at overall Min;Mod;Supervision level.  ?Reasons goals not met: Fluctuating cognition  ? ?Clinical Impression/Discharge Summary:   Pt met 2 out 6 goals, making poor progress due to fluctuating cognition. Pt had made gains in cognition for short periods of time, however intermittent confusion continued to be consistent. Pt demonstrated improvement in swallow function upgrading to regular textures and thin liquids, however reduced PO intake, due to cognition, continues to be a concern. Pt demonstrated overall improvements in sustained attention, basic problem solving, intellectual/emergent awareness, short term recall and expressing thoughts. Pt's barriers at d/c are no formal family education ( initially was to d/c SNF), however education was completed with one daughter pertaining to cognition/swallowing at an earlier date. Other barriers of due to impaired cognitive skills pt has reduced safety awareness and is a fall risk. Pt benefited from skilled ST services in order to maximize functional independence and reduce burden of care, requiring 24 hour supervision at discharge with continued skilled ST services. ? ?Care Partner:  ?Caregiver Able to Provide Assistance: Yes  ?Type of Caregiver Assistance: Physical;Cognitive ? ?Recommendation:  ?24 hour supervision/assistance;Home Health SLP  ?Rationale for SLP Follow Up: Maximize functional communication;Maximize swallowing safety;Maximize cognitive function and independence;Reduce caregiver burden  ? ?Equipment: N/A  ? ?Reasons for discharge: Discharged from hospital  ? ?Patient/Family Agrees with Progress Made and Goals Achieved: Yes  ? ? ?[name] ?01/07/2022, 3:00 PM ? ?

## 2022-01-07 NOTE — Progress Notes (Signed)
Occupational Therapy Note ? ?Patient Details  ?Name: [name] ?MRN: [id number] ?Date of Birth: 1951-08-05 ? ?Today's Date: 01/07/2022 ?OT Missed Time: 60 Minutes ?Missed Time Reason: Patient fatigue;Patient unwilling/refused to participate without medical reason ? ?Spent 20 mins trying to get pt to agree to sit EOB to eat breakfast in prep for ADL participation, pt making minimal eye contact and not responding to OTAs questions.  Pt missed 60 mins of skilled intervention.   ? ?[name] ?01/07/2022, 8:28 AM ?

## 2022-01-08 DIAGNOSIS — E162 Hypoglycemia, unspecified: Secondary | ICD-10-CM | POA: Diagnosis not present

## 2022-01-08 DIAGNOSIS — I468 Cardiac arrest due to other underlying condition: Secondary | ICD-10-CM | POA: Diagnosis not present

## 2022-01-08 DIAGNOSIS — D469 Myelodysplastic syndrome, unspecified: Secondary | ICD-10-CM | POA: Diagnosis not present

## 2022-01-08 DIAGNOSIS — R55 Syncope and collapse: Secondary | ICD-10-CM | POA: Diagnosis not present

## 2022-01-08 DIAGNOSIS — N184 Chronic kidney disease, stage 4 (severe): Secondary | ICD-10-CM | POA: Diagnosis not present

## 2022-01-08 DIAGNOSIS — R160 Hepatomegaly, not elsewhere classified: Secondary | ICD-10-CM | POA: Diagnosis not present

## 2022-01-08 DIAGNOSIS — E1121 Type 2 diabetes mellitus with diabetic nephropathy: Secondary | ICD-10-CM | POA: Diagnosis not present

## 2022-01-08 DIAGNOSIS — I2699 Other pulmonary embolism without acute cor pulmonale: Secondary | ICD-10-CM | POA: Diagnosis not present

## 2022-01-08 DIAGNOSIS — L8990 Pressure ulcer of unspecified site, unspecified stage: Secondary | ICD-10-CM | POA: Diagnosis not present

## 2022-01-09 ENCOUNTER — Inpatient Hospital Stay (HOSPITAL_COMMUNITY)
Admission: EM | Admit: 2022-01-09 | Discharge: 2022-01-16 | DRG: 639 | Disposition: A | Discharge status: Skilled Nursing Facility | Payer: Medicare PPO | Attending: Internal Medicine | Admitting: Internal Medicine

## 2022-01-09 ENCOUNTER — Encounter (HOSPITAL_COMMUNITY): Payer: Self-pay | Admitting: Emergency Medicine

## 2022-01-09 ENCOUNTER — Emergency Department (HOSPITAL_COMMUNITY): Payer: Medicare PPO

## 2022-01-09 DIAGNOSIS — I129 Hypertensive chronic kidney disease with stage 1 through stage 4 chronic kidney disease, or unspecified chronic kidney disease: Secondary | ICD-10-CM | POA: Diagnosis present

## 2022-01-09 DIAGNOSIS — Z9181 History of falling: Secondary | ICD-10-CM | POA: Diagnosis not present

## 2022-01-09 DIAGNOSIS — T383X5A Adverse effect of insulin and oral hypoglycemic [antidiabetic] drugs, initial encounter: Secondary | ICD-10-CM | POA: Diagnosis present

## 2022-01-09 DIAGNOSIS — J45909 Unspecified asthma, uncomplicated: Secondary | ICD-10-CM | POA: Diagnosis not present

## 2022-01-09 DIAGNOSIS — R55 Syncope and collapse: Secondary | ICD-10-CM | POA: Diagnosis not present

## 2022-01-09 DIAGNOSIS — Z743 Need for continuous supervision: Secondary | ICD-10-CM | POA: Diagnosis not present

## 2022-01-09 DIAGNOSIS — M79671 Pain in right foot: Secondary | ICD-10-CM | POA: Diagnosis not present

## 2022-01-09 DIAGNOSIS — Z794 Long term (current) use of insulin: Secondary | ICD-10-CM | POA: Diagnosis not present

## 2022-01-09 DIAGNOSIS — Z888 Allergy status to other drugs, medicaments and biological substances status: Secondary | ICD-10-CM

## 2022-01-09 DIAGNOSIS — Z87891 Personal history of nicotine dependence: Secondary | ICD-10-CM

## 2022-01-09 DIAGNOSIS — E11649 Type 2 diabetes mellitus with hypoglycemia without coma: Secondary | ICD-10-CM | POA: Diagnosis not present

## 2022-01-09 DIAGNOSIS — L89810 Pressure ulcer of head, unstageable: Secondary | ICD-10-CM | POA: Diagnosis present

## 2022-01-09 DIAGNOSIS — E119 Type 2 diabetes mellitus without complications: Secondary | ICD-10-CM | POA: Diagnosis present

## 2022-01-09 DIAGNOSIS — Z79899 Other long term (current) drug therapy: Secondary | ICD-10-CM

## 2022-01-09 DIAGNOSIS — M6281 Muscle weakness (generalized): Secondary | ICD-10-CM | POA: Diagnosis not present

## 2022-01-09 DIAGNOSIS — Z8674 Personal history of sudden cardiac arrest: Secondary | ICD-10-CM | POA: Diagnosis not present

## 2022-01-09 DIAGNOSIS — M6259 Muscle wasting and atrophy, not elsewhere classified, multiple sites: Secondary | ICD-10-CM | POA: Diagnosis not present

## 2022-01-09 DIAGNOSIS — N184 Chronic kidney disease, stage 4 (severe): Secondary | ICD-10-CM

## 2022-01-09 DIAGNOSIS — F419 Anxiety disorder, unspecified: Secondary | ICD-10-CM | POA: Diagnosis not present

## 2022-01-09 DIAGNOSIS — E7800 Pure hypercholesterolemia, unspecified: Secondary | ICD-10-CM | POA: Diagnosis not present

## 2022-01-09 DIAGNOSIS — G931 Anoxic brain damage, not elsewhere classified: Secondary | ICD-10-CM | POA: Diagnosis not present

## 2022-01-09 DIAGNOSIS — R404 Transient alteration of awareness: Principal | ICD-10-CM

## 2022-01-09 DIAGNOSIS — R2681 Unsteadiness on feet: Secondary | ICD-10-CM | POA: Diagnosis not present

## 2022-01-09 DIAGNOSIS — K219 Gastro-esophageal reflux disease without esophagitis: Secondary | ICD-10-CM | POA: Diagnosis not present

## 2022-01-09 DIAGNOSIS — I499 Cardiac arrhythmia, unspecified: Secondary | ICD-10-CM | POA: Diagnosis not present

## 2022-01-09 DIAGNOSIS — Z7901 Long term (current) use of anticoagulants: Secondary | ICD-10-CM

## 2022-01-09 DIAGNOSIS — Z808 Family history of malignant neoplasm of other organs or systems: Secondary | ICD-10-CM

## 2022-01-09 DIAGNOSIS — E1169 Type 2 diabetes mellitus with other specified complication: Secondary | ICD-10-CM | POA: Diagnosis present

## 2022-01-09 DIAGNOSIS — Z66 Do not resuscitate: Secondary | ICD-10-CM | POA: Diagnosis not present

## 2022-01-09 DIAGNOSIS — E876 Hypokalemia: Secondary | ICD-10-CM

## 2022-01-09 DIAGNOSIS — R569 Unspecified convulsions: Secondary | ICD-10-CM | POA: Diagnosis not present

## 2022-01-09 DIAGNOSIS — R9431 Abnormal electrocardiogram [ECG] [EKG]: Secondary | ICD-10-CM | POA: Diagnosis not present

## 2022-01-09 DIAGNOSIS — E1122 Type 2 diabetes mellitus with diabetic chronic kidney disease: Secondary | ICD-10-CM | POA: Diagnosis not present

## 2022-01-09 DIAGNOSIS — Z86711 Personal history of pulmonary embolism: Secondary | ICD-10-CM

## 2022-01-09 DIAGNOSIS — E162 Hypoglycemia, unspecified: Secondary | ICD-10-CM | POA: Diagnosis present

## 2022-01-09 DIAGNOSIS — L8990 Pressure ulcer of unspecified site, unspecified stage: Secondary | ICD-10-CM | POA: Diagnosis present

## 2022-01-09 DIAGNOSIS — R41841 Cognitive communication deficit: Secondary | ICD-10-CM | POA: Diagnosis not present

## 2022-01-09 DIAGNOSIS — R4182 Altered mental status, unspecified: Secondary | ICD-10-CM | POA: Diagnosis not present

## 2022-01-09 DIAGNOSIS — Z882 Allergy status to sulfonamides status: Secondary | ICD-10-CM

## 2022-01-09 DIAGNOSIS — Z7401 Bed confinement status: Secondary | ICD-10-CM | POA: Diagnosis not present

## 2022-01-09 DIAGNOSIS — Z881 Allergy status to other antibiotic agents status: Secondary | ICD-10-CM

## 2022-01-09 DIAGNOSIS — M797 Fibromyalgia: Secondary | ICD-10-CM | POA: Diagnosis present

## 2022-01-09 DIAGNOSIS — M1990 Unspecified osteoarthritis, unspecified site: Secondary | ICD-10-CM | POA: Diagnosis present

## 2022-01-09 DIAGNOSIS — R410 Disorientation, unspecified: Secondary | ICD-10-CM | POA: Diagnosis not present

## 2022-01-09 DIAGNOSIS — I10 Essential (primary) hypertension: Secondary | ICD-10-CM | POA: Diagnosis not present

## 2022-01-09 DIAGNOSIS — Z741 Need for assistance with personal care: Secondary | ICD-10-CM | POA: Diagnosis not present

## 2022-01-09 DIAGNOSIS — Z885 Allergy status to narcotic agent status: Secondary | ICD-10-CM

## 2022-01-09 DIAGNOSIS — E161 Other hypoglycemia: Secondary | ICD-10-CM | POA: Diagnosis not present

## 2022-01-09 DIAGNOSIS — Z886 Allergy status to analgesic agent status: Secondary | ICD-10-CM

## 2022-01-09 DIAGNOSIS — G4700 Insomnia, unspecified: Secondary | ICD-10-CM | POA: Diagnosis present

## 2022-01-09 DIAGNOSIS — Z8249 Family history of ischemic heart disease and other diseases of the circulatory system: Secondary | ICD-10-CM

## 2022-01-09 DIAGNOSIS — I700 Atherosclerosis of aorta: Secondary | ICD-10-CM | POA: Diagnosis not present

## 2022-01-09 LAB — COMPREHENSIVE METABOLIC PANEL
ALT: 21 U/L (ref 0–44)
AST: 23 U/L (ref 15–41)
Albumin: 3.6 g/dL (ref 3.5–5.0)
Alkaline Phosphatase: 74 U/L (ref 38–126)
Anion gap: 9 (ref 5–15)
BUN: 34 mg/dL — ABNORMAL HIGH (ref 8–23)
CO2: 19 mmol/L — ABNORMAL LOW (ref 22–32)
Calcium: 9.7 mg/dL (ref 8.9–10.3)
Chloride: 109 mmol/L (ref 98–111)
Creatinine, Ser: 2.51 mg/dL — ABNORMAL HIGH (ref 0.44–1.00)
GFR, Estimated: 20 mL/min — ABNORMAL LOW (ref >60)
Glucose, Bld: 107 mg/dL — ABNORMAL HIGH (ref 70–99)
Potassium: 3.6 mmol/L (ref 3.5–5.1)
Sodium: 137 mmol/L (ref 135–145)
Total Bilirubin: 0.4 mg/dL (ref 0.3–1.2)
Total Protein: 8.4 g/dL — ABNORMAL HIGH (ref 6.5–8.1)

## 2022-01-09 LAB — URINALYSIS, ROUTINE W REFLEX MICROSCOPIC
Bilirubin Urine: NEGATIVE
Glucose, UA: NEGATIVE mg/dL
Hgb urine dipstick: NEGATIVE
Ketones, ur: NEGATIVE mg/dL
Leukocytes,Ua: NEGATIVE
Nitrite: NEGATIVE
Protein, ur: NEGATIVE mg/dL
Specific Gravity, Urine: 1.012 (ref 1.005–1.030)
pH: 5 (ref 5.0–8.0)

## 2022-01-09 LAB — GLUCOSE, CAPILLARY
Glucose-Capillary: 112 mg/dL — ABNORMAL HIGH (ref 70–99)
Glucose-Capillary: 161 mg/dL — ABNORMAL HIGH (ref 70–99)
Glucose-Capillary: 71 mg/dL (ref 70–99)
Glucose-Capillary: 91 mg/dL (ref 70–99)

## 2022-01-09 LAB — CBC WITH DIFFERENTIAL/PLATELET
Abs Immature Granulocytes: 0.04 10*3/uL (ref 0.00–0.07)
Basophils Absolute: 0.1 10*3/uL (ref 0.0–0.1)
Basophils Relative: 1 %
Eosinophils Absolute: 0 10*3/uL (ref 0.0–0.5)
Eosinophils Relative: 0 %
HCT: 28.9 % — ABNORMAL LOW (ref 36.0–46.0)
Hemoglobin: 9.3 g/dL — ABNORMAL LOW (ref 12.0–15.0)
Immature Granulocytes: 0 %
Lymphocytes Relative: 8 %
Lymphs Abs: 0.8 10*3/uL (ref 0.7–4.0)
MCH: 28.5 pg (ref 26.0–34.0)
MCHC: 32.2 g/dL (ref 30.0–36.0)
MCV: 88.7 fL (ref 80.0–100.0)
Monocytes Absolute: 0.4 10*3/uL (ref 0.1–1.0)
Monocytes Relative: 4 %
Neutro Abs: 8.6 10*3/uL — ABNORMAL HIGH (ref 1.7–7.7)
Neutrophils Relative %: 87 %
Platelets: 432 10*3/uL — ABNORMAL HIGH (ref 150–400)
RBC: 3.26 MIL/uL — ABNORMAL LOW (ref 3.87–5.11)
RDW: 14 % (ref 11.5–15.5)
WBC: 9.8 10*3/uL (ref 4.0–10.5)
nRBC: 0 % (ref 0.0–0.2)

## 2022-01-09 LAB — CBG MONITORING, ED
Glucose-Capillary: 86 mg/dL (ref 70–99)
Glucose-Capillary: 100 mg/dL — ABNORMAL HIGH (ref 70–99)
Glucose-Capillary: 166 mg/dL — ABNORMAL HIGH (ref 70–99)
Glucose-Capillary: 21 mg/dL — CL (ref 70–99)

## 2022-01-09 LAB — MRSA NEXT GEN BY PCR, NASAL: MRSA by PCR Next Gen: NOT DETECTED

## 2022-01-09 LAB — LACTIC ACID, PLASMA: Lactic Acid, Venous: 1.1 mmol/L (ref 0.5–1.9)

## 2022-01-09 MED ORDER — ALBUTEROL SULFATE HFA 108 (90 BASE) MCG/ACT IN AERS
1.0000 | INHALATION_SPRAY | RESPIRATORY_TRACT | Status: DC | PRN
  Filled 2022-01-09: qty 6.7

## 2022-01-09 MED ORDER — FOLIC ACID 1 MG PO TABS
2.0000 mg | ORAL_TABLET | Freq: Every day | ORAL | Status: DC
  Administered 2022-01-09 – 2022-01-16 (×7): 2 mg via ORAL
  Filled 2022-01-09 (×8): qty 2

## 2022-01-09 MED ORDER — SODIUM CHLORIDE 0.9 % IV BOLUS
1000.0000 mL | Freq: Once | INTRAVENOUS | Status: AC
Start: 2022-01-09 — End: 2022-01-09
  Administered 2022-01-09: 1000 mL via INTRAVENOUS

## 2022-01-09 MED ORDER — ONDANSETRON HCL 4 MG/2ML IJ SOLN: 4.0000 mg | Freq: Four times a day (QID) | INTRAMUSCULAR | Status: DC | PRN

## 2022-01-09 MED ORDER — ONDANSETRON HCL 4 MG PO TABS: 4.0000 mg | ORAL_TABLET | Freq: Four times a day (QID) | ORAL | Status: DC | PRN

## 2022-01-09 MED ORDER — PAROXETINE HCL 20 MG PO TABS
40.0000 mg | ORAL_TABLET | Freq: Every day | ORAL | Status: DC
  Administered 2022-01-09 – 2022-01-15 (×7): 40 mg via ORAL
  Filled 2022-01-09 (×7): qty 2

## 2022-01-09 MED ORDER — ACETAMINOPHEN 650 MG RE SUPP: 650.0000 mg | Freq: Four times a day (QID) | RECTAL | Status: DC | PRN

## 2022-01-09 MED ORDER — DEXTROSE 10 % IV SOLN: INTRAVENOUS | Status: DC

## 2022-01-09 MED ORDER — DICYCLOMINE HCL 10 MG PO CAPS
10.0000 mg | ORAL_CAPSULE | Freq: Three times a day (TID) | ORAL | Status: DC
  Administered 2022-01-10 – 2022-01-16 (×14): 10 mg via ORAL
  Filled 2022-01-09 (×24): qty 1

## 2022-01-09 MED ORDER — TEMAZEPAM 15 MG PO CAPS
15.0000 mg | ORAL_CAPSULE | Freq: Once | ORAL | Status: AC
  Administered 2022-01-09: 15 mg via ORAL
  Filled 2022-01-09: qty 1

## 2022-01-09 MED ORDER — SODIUM CHLORIDE 0.9% FLUSH
3.0000 mL | Freq: Two times a day (BID) | INTRAVENOUS | Status: DC
  Administered 2022-01-09 – 2022-01-16 (×10): 3 mL via INTRAVENOUS

## 2022-01-09 MED ORDER — APIXABAN 5 MG PO TABS
5.0000 mg | ORAL_TABLET | Freq: Two times a day (BID) | ORAL | Status: DC
  Administered 2022-01-09 – 2022-01-16 (×13): 5 mg via ORAL
  Filled 2022-01-09 (×14): qty 1

## 2022-01-09 MED ORDER — CHLORHEXIDINE GLUCONATE CLOTH 2 % EX PADS
6.0000 | MEDICATED_PAD | Freq: Every day | CUTANEOUS | Status: DC
  Administered 2022-01-09 – 2022-01-11 (×3): 6 via TOPICAL

## 2022-01-09 MED ORDER — AMLODIPINE BESYLATE 10 MG PO TABS
10.0000 mg | ORAL_TABLET | Freq: Every day | ORAL | Status: DC
  Administered 2022-01-10 – 2022-01-15 (×6): 10 mg via ORAL
  Filled 2022-01-09 (×7): qty 1

## 2022-01-09 MED ORDER — ATORVASTATIN CALCIUM 10 MG PO TABS
20.0000 mg | ORAL_TABLET | Freq: Every day | ORAL | Status: DC
  Administered 2022-01-10 – 2022-01-16 (×6): 20 mg via ORAL
  Filled 2022-01-09 (×7): qty 2

## 2022-01-09 MED ORDER — ACETAMINOPHEN 325 MG PO TABS
650.0000 mg | ORAL_TABLET | Freq: Four times a day (QID) | ORAL | Status: DC | PRN
  Administered 2022-01-10 – 2022-01-12 (×2): 650 mg via ORAL
  Filled 2022-01-09 (×2): qty 2

## 2022-01-09 MED ORDER — DEXTROSE 50 % IV SOLN: 1.0000 | Freq: Once | INTRAVENOUS | Status: DC

## 2022-01-09 MED ORDER — DEXTROSE 50 % IV SOLN
INTRAVENOUS | Status: AC
  Filled 2022-01-09: qty 50

## 2022-01-09 MED ORDER — PANTOPRAZOLE SODIUM 40 MG PO TBEC
40.0000 mg | DELAYED_RELEASE_TABLET | Freq: Two times a day (BID) | ORAL | Status: DC
  Administered 2022-01-09 – 2022-01-16 (×13): 40 mg via ORAL
  Filled 2022-01-09 (×14): qty 1

## 2022-01-09 NOTE — Plan of Care (Signed)

## 2022-01-09 NOTE — ED Notes (Signed)
Pts CBG reading 21 on glucometer. Pt is alert and verbal. Pt given juice. Pt given D50 through IV. Dr. Marylyn Ishihara paged.  ?

## 2022-01-09 NOTE — ED Triage Notes (Signed)
BIB EMS from home, daughter noticed patient was unresponsive in bed this AM, salivating at mouth. CBG w/ EMS was initially 42, was given a total of 20 mg D10 en route, CBG went up to 164, down to 88 and then was 144 per EMS at time of arrival to hospital. ? ?CBG 86 checked in room. Alert and oriented.  ?

## 2022-01-09 NOTE — ED Notes (Signed)
Given 2 cups of OJ and encouraged to drink. Crackers and PB as well as a breakfast sandwich given for PO intake.  ?

## 2022-01-09 NOTE — ED Notes (Signed)
Spoke with lab. Lab states machine broke and are still working on UA. ?

## 2022-01-09 NOTE — ED Provider Notes (Signed)
?[facility] DEPT ?Provider Note ? ? ?CSN: [id number] ?Arrival date & time: 01/09/22  1004 ? ?  ? ?History ? ?No chief complaint on file. ? ? ?[name] is a 71 y.o. female. ? ?71 yo F with a chief complaints of low blood sugar.  The patient was found by the daughter and was unable to be awoken this morning.  Found to have blood sugar of 42.  Given D50 with improvement of her mental status.  Patient's not sure exactly what happened.  Per EMS the patient had had a meal normally last night and had taken her nighttime dose of her long-acting insulin.  Has had some trouble with eating and drinking since she was recently in the hospital.  The patient denies any complaint denies cough congestion or fever denies abdominal pain denies chest pain or pressure denies headache.  She feels like she has been doing well since hospitalization. ? ? ? ?  ? ?Home Medications ?Prior to Admission medications   ?Medication Sig Start Date End Date Taking? Authorizing Provider  ?acetaminophen (TYLENOL) 325 MG tablet Take 2 tablets (650 mg total) by mouth every 4 (four) hours as needed for fever (greater than 37.6 C). 01/07/22  Yes Angiulli, Lavon Paganini, PA-C  ?albuterol (VENTOLIN HFA) 108 (90 Base) MCG/ACT inhaler Inhale 1-2 puffs into the lungs every 4 (four) hours as needed for wheezing or shortness of breath. 01/07/22  Yes Angiulli, Lavon Paganini, PA-C  ?amLODipine (NORVASC) 10 MG tablet Take 1 tablet (10 mg total) by mouth daily at 8 pm. 01/07/22  Yes Angiulli, Lavon Paganini, PA-C  ?apixaban (ELIQUIS) 5 MG TABS tablet Take 1 tablet (5 mg total) by mouth 2 (two) times daily. 01/07/22  Yes Angiulli, Lavon Paganini, PA-C  ?atorvastatin (LIPITOR) 20 MG tablet Take 1 tablet (20 mg total) by mouth daily. 01/07/22  Yes Angiulli, Lavon Paganini, PA-C  ?B Complex-C (B-COMPLEX WITH VITAMIN C) tablet Take 1 tablet by mouth daily. 01/08/22  Yes Angiulli, Lavon Paganini, PA-C  ?dicyclomine (BENTYL) 10 MG capsule Take 1 capsule (10 mg total) by mouth 3  (three) times daily before meals. 01/07/22  Yes Angiulli, Lavon Paganini, PA-C  ?folic acid (FOLVITE) 1 MG tablet Take 2 tablets (2 mg total) by mouth daily. 01/07/22  Yes Angiulli, Lavon Paganini, PA-C  ?insulin detemir (LEVEMIR FLEXPEN) 100 UNIT/ML FlexPen Inject 11 Units into the skin 2 (two) times daily. ?Patient taking differently: Inject 11 Units into the skin at bedtime. 01/07/22  Yes Angiulli, Lavon Paganini, PA-C  ?lidocaine (LIDODERM) 5 % Place 2 patches onto the skin daily. Remove & Discard patch within 12 hours or as directed by MD 01/07/22  Yes Angiulli, Lavon Paganini, PA-C  ?oxyCODONE (OXY IR/ROXICODONE) 5 MG immediate release tablet Take 1 tablet (5 mg total) by mouth every 4 (four) hours as needed for severe pain or moderate pain. 01/07/22  Yes Angiulli, Lavon Paganini, PA-C  ?pantoprazole (PROTONIX) 40 MG tablet Take 1 tablet (40 mg total) by mouth 2 (two) times daily. 01/07/22  Yes Angiulli, Lavon Paganini, PA-C  ?PARoxetine (PAXIL) 40 MG tablet Take 1 tablet (40 mg total) by mouth at bedtime. 01/07/22  Yes Angiulli, Lavon Paganini, PA-C  ?temazepam (RESTORIL) 15 MG capsule TAKE 1 CAPSULE(15 MG) BY MOUTH AT BEDTIME AS NEEDED ?Patient taking differently: Take 15 mg by mouth at bedtime as needed for sleep. 01/07/22  Yes Angiulli, Lavon Paganini, PA-C  ?Accu-Chek Softclix Lancets lancets Use up to four times daily as directed 01/07/22  Cathlyn Parsons, PA-C  ?ALPRAZolam (XANAX) 0.25 MG tablet Take 1 tablet (0.25 mg total) by mouth daily. 01/08/22   Angiulli, Lavon Paganini, PA-C  ?benzocaine (ORAJEL) 10 % mucosal gel Use as directed in the mouth or throat with breakfast, with lunch, and with evening meal. ?Patient not taking: Reported on 01/09/2022 01/07/22   Angiulli, Lavon Paganini, PA-C  ?blood glucose meter kit and supplies Dispense based on patient and insurance preference. Use up to four times daily as directed. (FOR ICD-10 E10.9, E11.9). 01/07/22   Angiulli, Lavon Paganini, PA-C  ?Blood Glucose Monitoring Suppl (BLOOD GLUCOSE MONITOR SYSTEM) w/Device KIT Use up to  four times daily as directed 01/07/22   Angiulli, Lavon Paganini, PA-C  ?glucose blood (ACCU-CHEK GUIDE) test strip Use up to four times daily as directed 01/07/22   Cathlyn Parsons, PA-C  ?Insulin Pen Needle 32G X 4 MM MISC Use twice daily with Levemir Flexpen 01/07/22   Lovorn, Jinny Blossom, MD  ?Vitamin D, Ergocalciferol, (DRISDOL) 1.25 MG (50000 UNIT) CAPS capsule Take 1 capsule (50,000 Units total) by mouth once a week. ?Patient not taking: Reported on 01/09/2022 01/07/22   [place], Lavon Paganini, PA-C  ?   ? ?Allergies    ?Aspirin, Benadryl [diphenhydramine], Darvon [propoxyphene], Hydrocodone bit-homatrop mbr, Metformin, Other, Pentazocine, Percocet [oxycodone-acetaminophen], Sulfa antibiotics, Tetracaine hcl, Tetracyclines & related, and Tramadol   ? ?Review of Systems   ?Review of Systems ? ?Physical Exam ?Updated Vital Signs ?BP 120/67   Pulse 96   Temp 97.7 ?F (36.5 ?C) (Oral)   Resp 12   SpO2 100%  ?Physical Exam ?Vitals and nursing note reviewed.  ?Constitutional:   ?   General: She is not in acute distress. ?   Appearance: She is well-developed. She is not diaphoretic.  ?HENT:  ?   Head: Normocephalic and atraumatic.  ?Eyes:  ?   Pupils: Pupils are equal, round, and reactive to light.  ?Cardiovascular:  ?   Rate and Rhythm: Normal rate and regular rhythm.  ?   Heart sounds: No murmur heard. ?  No friction rub. No gallop.  ?Pulmonary:  ?   Effort: Pulmonary effort is normal.  ?   Breath sounds: No wheezing or rales.  ?Abdominal:  ?   General: There is no distension.  ?   Palpations: Abdomen is soft.  ?   Tenderness: There is no abdominal tenderness.  ?Musculoskeletal:     ?   General: No tenderness.  ?   Cervical back: Normal range of motion and neck supple.  ?Skin: ?   General: Skin is warm and dry.  ?Neurological:  ?   Mental Status: She is alert and oriented to person, place, and time.  ?Psychiatric:     ?   Behavior: Behavior normal.  ? ? ?ED Results / Procedures / Treatments   ?Labs ?(all labs ordered are listed,  but only abnormal results are displayed) ?Labs Reviewed  ?CBC WITH DIFFERENTIAL/PLATELET - Abnormal; Notable for the following components:  ?    Result Value  ? RBC 3.26 (*)   ? Hemoglobin 9.3 (*)   ? HCT 28.9 (*)   ? Platelets 432 (*)   ? Neutro Abs 8.6 (*)   ? All other components within normal limits  ?COMPREHENSIVE METABOLIC PANEL - Abnormal; Notable for the following components:  ? CO2 19 (*)   ? Glucose, Bld 107 (*)   ? BUN 34 (*)   ? Creatinine, Ser 2.51 (*)   ? Total Protein 8.4 (*)   ?  GFR, Estimated 20 (*)   ? All other components within normal limits  ?CBG MONITORING, ED - Abnormal; Notable for the following components:  ? Glucose-Capillary 100 (*)   ? All other components within normal limits  ?URINE CULTURE  ?CULTURE, BLOOD (ROUTINE X 2)  ?CULTURE, BLOOD (ROUTINE X 2)  ?URINALYSIS, ROUTINE W REFLEX MICROSCOPIC  ?LACTIC ACID, PLASMA  ?CBG MONITORING, ED  ? ? ?EKG ?EKG Interpretation ? ?Date/Time:  Wednesday January 09 2022 10:17:21 EDT ?Ventricular Rate:  83 ?PR Interval:  95 ?QRS Duration: 89 ?QT Interval:  385 ?QTC Calculation: 453 ?R Axis:   22 ?Text Interpretation: Sinus rhythm Short PR interval Abnormal R-wave progression, early transition Rate slower diffuse st changes resolved Otherwise no significant change Confirmed by Deno Etienne [phone]) on 01/09/2022 10:21:26 AM ? ?Radiology ?CT Head Wo Contrast ? ?Result Date: 01/09/2022 ?CLINICAL DATA:  Altered mental status EXAM: CT HEAD WITHOUT CONTRAST TECHNIQUE: Contiguous axial images were obtained from the base of the skull through the vertex without intravenous contrast. RADIATION DOSE REDUCTION: This exam was performed according to the departmental dose-optimization program which includes automated exposure control, adjustment of the mA and/or kV according to patient size and/or use of iterative reconstruction technique. COMPARISON:  12/14/2021 FINDINGS: Brain: There are no signs of bleeding. Ventricles are unremarkable. There is no shift of midline  structures. In the image 13 of series 2, there is subtle decreased density in the left temporal lobe. This finding is not evident in the immediate adjacent images. In the image 10, there is subtle decreased de

## 2022-01-09 NOTE — ED Notes (Signed)
Hospitalist bedside 

## 2022-01-09 NOTE — ED Notes (Signed)
Patient transported to CT 

## 2022-01-09 NOTE — ED Notes (Signed)
Pt having issues remembering the past and thinks people who have passed are still alive in her family. Per daughter patient has done this before with low blood sugar.  ?

## 2022-01-09 NOTE — H&P (Signed)
?History and Physical  ? ? ?Patient: [name] EHO:[id number] DOB: January 28, 1951 ?DOA: 01/09/2022 ?DOS: the patient was seen and examined on 01/09/2022 ?PCP: [name], MD  ?Patient coming from: Home ? ?Chief Complaint: altered mental status ? ?HPI: [name] is a 71 y.o. female with medical history significant of GAD, GERD, HLD, HTN, DM2, Pulm embolism. Presenting with altered mental status. History is from daughter at bedside. The patient was in her normal state of health until this morning. Her daughter went to check and she found that the patient was unresponsive. She was foaming at the mouth and making a gurgling sound. No shaking/tonic clonic movements. She wasn't responding to verbal command. Her daughter called EMS immediately.  ? ?EMS found that the patient was hypoglycemic. Per her daughter, the patient took her correct dose of insulin last night. She has been eating, but not completing her meals recently. She otherwise denies any aggravating or alleviating factors.  ? ?Review of Systems: As mentioned in the history of present illness. All other systems reviewed and are negative. ?Past Medical History:  ?Diagnosis Date  ? Anxiety   ? Arthritis   ? Asthma   ? Cardiac arrest ([place]) 10/28/2021  ? Cardiac arrest ([place]) 10/28/2021  ? Depression   ? Diabetes mellitus without complication ([place])   ? Fibromyalgia   ? GERD (gastroesophageal reflux disease)   ? Hyperlipemia   ? Hypertension   ? PONV (postoperative nausea and vomiting)   ? ?Past Surgical History:  ?Procedure Laterality Date  ? COLONOSCOPY    ? DILATION AND CURETTAGE OF UTERUS    ? IR EMBO ART  VEN HEMORR LYMPH EXTRAV  INC GUIDE ROADMAPPING  11/01/2021  ? IR FLUORO GUIDE CV LINE RIGHT  11/20/2021  ? IR REMOVAL TUN CV CATH W/O FL  12/20/2021  ? IR THROMBECT PRIM MECH INIT (INCLU) MOD SED  10/29/2021  ? IR US GUIDE VASC ACCESS RIGHT  10/29/2021  ? IR US GUIDE VASC ACCESS RIGHT  11/01/2021  ? IR US GUIDE VASC ACCESS RIGHT  11/21/2021  ? KNEE ARTHROSCOPY   [id number]  ? left  ? SHOULDER ARTHROSCOPY    ? left  ? TRIGGER FINGER RELEASE  08/13/2012  ? Procedure: RELEASE TRIGGER FINGER/A-1 PULLEY;  Surgeon: [name]., MD;  Location: [facility];  Service: Orthopedics;  Laterality: Right;  long finger  ? TUBAL LIGATION    ? ?Social History:  reports that she quit smoking about 11 years ago. Her smoking use included cigarettes. She has a 10.00 pack-year smoking history. She has never used smokeless tobacco. She reports that she does not drink alcohol and does not use drugs. ? ?Allergies  ?Allergen Reactions  ? Aspirin Nausea And Vomiting  ?  Other reaction(s): Unknown  ? Benadryl [Diphenhydramine]   ?  Can only take dye free. States the dye causes itching.  ? Darvon [Propoxyphene] Itching  ?  Can only during the day. Night time makes her itch  ? Hydrocodone Bit-[place] Mbr Itching  ?  Pre medicate  ? Metformin   ?  Other reaction(s): GI  ? Other   ?  Other reaction(s): Unknown. States reaction was to nasal spray that caused pupils to shrink  ? Pentazocine   ?  nervous ?Other reaction(s): jitters  ? Percocet [Oxycodone-Acetaminophen]   ?  Itching; pre medicate  ? Sulfa Antibiotics Hives  ? Tetracaine Hcl   ?  Other reaction(s): Unknown. Thinks caused itching.  ? Tetracyclines &  Related Hives  ? Tramadol   ?  Other reaction(s): itch  ? ? ?Family History  ?Problem Relation Age of Onset  ? Diabetes Mother   ? Heart attack Father   ? Diabetes Sister   ? Throat cancer Brother   ? Hypertension Daughter   ? ? ?Prior to Admission medications   ?Medication Sig Start Date End Date Taking? Authorizing Provider  ?acetaminophen (TYLENOL) 325 MG tablet Take 2 tablets (650 mg total) by mouth every 4 (four) hours as needed for fever (greater than 37.6 C). 01/07/22  Yes Angiulli, Lavon Paganini, PA-C  ?albuterol (VENTOLIN HFA) 108 (90 Base) MCG/ACT inhaler Inhale 1-2 puffs into the lungs every 4 (four) hours as needed for wheezing or shortness of breath. 01/07/22  Yes  Angiulli, Lavon Paganini, PA-C  ?amLODipine (NORVASC) 10 MG tablet Take 1 tablet (10 mg total) by mouth daily at 8 pm. 01/07/22  Yes Angiulli, Lavon Paganini, PA-C  ?apixaban (ELIQUIS) 5 MG TABS tablet Take 1 tablet (5 mg total) by mouth 2 (two) times daily. 01/07/22  Yes Angiulli, Lavon Paganini, PA-C  ?atorvastatin (LIPITOR) 20 MG tablet Take 1 tablet (20 mg total) by mouth daily. 01/07/22  Yes Angiulli, Lavon Paganini, PA-C  ?B Complex-C (B-COMPLEX WITH VITAMIN C) tablet Take 1 tablet by mouth daily. 01/08/22  Yes Angiulli, Lavon Paganini, PA-C  ?dicyclomine (BENTYL) 10 MG capsule Take 1 capsule (10 mg total) by mouth 3 (three) times daily before meals. 01/07/22  Yes Angiulli, Lavon Paganini, PA-C  ?folic acid (FOLVITE) 1 MG tablet Take 2 tablets (2 mg total) by mouth daily. 01/07/22  Yes Angiulli, Lavon Paganini, PA-C  ?insulin detemir (LEVEMIR FLEXPEN) 100 UNIT/ML FlexPen Inject 11 Units into the skin 2 (two) times daily. ?Patient taking differently: Inject 11 Units into the skin at bedtime. 01/07/22  Yes Angiulli, Lavon Paganini, PA-C  ?lidocaine (LIDODERM) 5 % Place 2 patches onto the skin daily. Remove & Discard patch within 12 hours or as directed by MD 01/07/22  Yes Angiulli, Lavon Paganini, PA-C  ?oxyCODONE (OXY IR/ROXICODONE) 5 MG immediate release tablet Take 1 tablet (5 mg total) by mouth every 4 (four) hours as needed for severe pain or moderate pain. 01/07/22  Yes Angiulli, Lavon Paganini, PA-C  ?pantoprazole (PROTONIX) 40 MG tablet Take 1 tablet (40 mg total) by mouth 2 (two) times daily. 01/07/22  Yes Angiulli, Lavon Paganini, PA-C  ?PARoxetine (PAXIL) 40 MG tablet Take 1 tablet (40 mg total) by mouth at bedtime. 01/07/22  Yes Angiulli, Lavon Paganini, PA-C  ?temazepam (RESTORIL) 15 MG capsule TAKE 1 CAPSULE(15 MG) BY MOUTH AT BEDTIME AS NEEDED ?Patient taking differently: Take 15 mg by mouth at bedtime as needed for sleep. 01/07/22  Yes Angiulli, Lavon Paganini, PA-C  ?Accu-Chek Softclix Lancets lancets Use up to four times daily as directed 01/07/22   Angiulli, Lavon Paganini, PA-C   ?ALPRAZolam (XANAX) 0.25 MG tablet Take 1 tablet (0.25 mg total) by mouth daily. 01/08/22   Angiulli, Lavon Paganini, PA-C  ?benzocaine (ORAJEL) 10 % mucosal gel Use as directed in the mouth or throat with breakfast, with lunch, and with evening meal. ?Patient not taking: Reported on 01/09/2022 01/07/22   Angiulli, Lavon Paganini, PA-C  ?blood glucose meter kit and supplies Dispense based on patient and insurance preference. Use up to four times daily as directed. (FOR ICD-10 E10.9, E11.9). 01/07/22   Angiulli, Lavon Paganini, PA-C  ?Blood Glucose Monitoring Suppl (BLOOD GLUCOSE MONITOR SYSTEM) w/Device KIT Use up to four times daily as directed 01/07/22  Cathlyn Parsons, PA-C  ?glucose blood (ACCU-CHEK GUIDE) test strip Use up to four times daily as directed 01/07/22   Angiulli, Lavon Paganini, PA-C  ?Insulin Pen Needle 32G X 4 MM MISC Use twice daily with Levemir Flexpen 01/07/22   Lovorn, Jinny Blossom, MD  ?Vitamin D, Ergocalciferol, (DRISDOL) 1.25 MG (50000 UNIT) CAPS capsule Take 1 capsule (50,000 Units total) by mouth once a week. ?Patient not taking: Reported on 01/09/2022 01/07/22   Dunellen, Lavon Paganini, PA-C  ? ? ?Physical Exam: ?Vitals:  ? 01/09/22 1437 01/09/22 1439 01/09/22 1440 01/09/22 1500  ?BP:  (!) 153/84  120/67  ?Pulse: 100 (!) 102 99 96  ?Resp: 15 17 14 12   ?Temp:      ?TempSrc:      ?SpO2: 100% 100% 100% 100%  ? ?General: 71 y.o. female resting in bed in NAD ?Eyes: PERRL, normal sclera ?ENMT: Nares patent w/o discharge, orophaynx clear, dentition normal, ears w/o discharge/lesions/ulcers ?Neck: Supple, trachea midline ?Cardiovascular: RRR, +S1, S2, no m/g/r, equal pulses throughout ?Respiratory: CTABL, no w/r/r, normal WOB ?GI: BS+, NDNT, no masses noted, no organomegaly noted ?MSK: No e/c/c ?Neuro: A&O x 3, no focal deficits ?Psyc: Appropriate interaction and affect, calm/cooperative ? ?Data Reviewed: ? ?CO2  19 ?Glucose  107 ?BUN  34 ?SCr  2.51 ?Hgb  9.3 ? ?CTH: There are no signs of bleeding within the cranium. There is no focal  mass effect. There is subtle decreased density in the left temporal lobe which may suggest partial volume averaging artifact or area of ischemia. If clinically warranted, follow-up MRI may be considered.  ? ?MRI Brain: 1

## 2022-01-09 NOTE — ED Notes (Signed)
Dr. Marylyn Ishihara aware pts CBG 21 at 1653. New orders to follow for IV fluids.  ?

## 2022-01-09 NOTE — ED Notes (Signed)
Patient transported to MRI 

## 2022-01-09 NOTE — ED Notes (Signed)
Pt is alert and oriented to baseline per daughter. Has drank most of her OJ, had 1/2 of her breakfast sandwich and half of a PB covered cracker.  ?

## 2022-01-10 ENCOUNTER — Inpatient Hospital Stay (HOSPITAL_COMMUNITY)
Admit: 2022-01-10 | Discharge: 2022-01-10 | Disposition: A | Discharge status: Home / Self Care | Payer: Medicare PPO | Attending: Internal Medicine | Admitting: Internal Medicine

## 2022-01-10 ENCOUNTER — Inpatient Hospital Stay (HOSPITAL_COMMUNITY): Payer: Medicare PPO

## 2022-01-10 DIAGNOSIS — F419 Anxiety disorder, unspecified: Secondary | ICD-10-CM | POA: Diagnosis not present

## 2022-01-10 DIAGNOSIS — N184 Chronic kidney disease, stage 4 (severe): Secondary | ICD-10-CM | POA: Diagnosis not present

## 2022-01-10 DIAGNOSIS — R55 Syncope and collapse: Secondary | ICD-10-CM | POA: Diagnosis not present

## 2022-01-10 DIAGNOSIS — I10 Essential (primary) hypertension: Secondary | ICD-10-CM | POA: Diagnosis not present

## 2022-01-10 DIAGNOSIS — Z86711 Personal history of pulmonary embolism: Secondary | ICD-10-CM

## 2022-01-10 DIAGNOSIS — E162 Hypoglycemia, unspecified: Secondary | ICD-10-CM | POA: Diagnosis not present

## 2022-01-10 DIAGNOSIS — E876 Hypokalemia: Secondary | ICD-10-CM

## 2022-01-10 LAB — ECHOCARDIOGRAM COMPLETE
AR max vel: 2.24 cm2
AV Area VTI: 2.73 cm2
AV Area mean vel: 2.04 cm2
AV Mean grad: 3 mmHg
AV Peak grad: 7.2 mmHg
Ao pk vel: 1.34 m/s
Area-P 1/2: 2.73 cm2
Calc EF: 55.9 %
S' Lateral: 2.7 cm
Single Plane A2C EF: 47.8 %
Single Plane A4C EF: 62.6 %
Weight: 2328.06 oz

## 2022-01-10 LAB — GLUCOSE, CAPILLARY
Glucose-Capillary: 132 mg/dL — ABNORMAL HIGH (ref 70–99)
Glucose-Capillary: 140 mg/dL — ABNORMAL HIGH (ref 70–99)
Glucose-Capillary: 141 mg/dL — ABNORMAL HIGH (ref 70–99)
Glucose-Capillary: 144 mg/dL — ABNORMAL HIGH (ref 70–99)
Glucose-Capillary: 41 mg/dL — CL (ref 70–99)
Glucose-Capillary: 42 mg/dL — CL (ref 70–99)
Glucose-Capillary: 64 mg/dL — ABNORMAL LOW (ref 70–99)
Glucose-Capillary: 76 mg/dL (ref 70–99)
Glucose-Capillary: 83 mg/dL (ref 70–99)
Glucose-Capillary: 84 mg/dL (ref 70–99)
Glucose-Capillary: 97 mg/dL (ref 70–99)
Glucose-Capillary: 99 mg/dL (ref 70–99)
Glucose-Capillary: 99 mg/dL (ref 70–99)

## 2022-01-10 LAB — CBC
HCT: 25.7 % — ABNORMAL LOW (ref 36.0–46.0)
Hemoglobin: 8.1 g/dL — ABNORMAL LOW (ref 12.0–15.0)
MCH: 28.3 pg (ref 26.0–34.0)
MCHC: 31.5 g/dL (ref 30.0–36.0)
MCV: 89.9 fL (ref 80.0–100.0)
Platelets: 355 10*3/uL (ref 150–400)
RBC: 2.86 MIL/uL — ABNORMAL LOW (ref 3.87–5.11)
RDW: 14.2 % (ref 11.5–15.5)
WBC: 7.8 10*3/uL (ref 4.0–10.5)
nRBC: 0 % (ref 0.0–0.2)

## 2022-01-10 LAB — COMPREHENSIVE METABOLIC PANEL
ALT: 17 U/L (ref 0–44)
AST: 19 U/L (ref 15–41)
Albumin: 3.1 g/dL — ABNORMAL LOW (ref 3.5–5.0)
Alkaline Phosphatase: 62 U/L (ref 38–126)
Anion gap: 9 (ref 5–15)
BUN: 27 mg/dL — ABNORMAL HIGH (ref 8–23)
CO2: 18 mmol/L — ABNORMAL LOW (ref 22–32)
Calcium: 9 mg/dL (ref 8.9–10.3)
Chloride: 108 mmol/L (ref 98–111)
Creatinine, Ser: 2.18 mg/dL — ABNORMAL HIGH (ref 0.44–1.00)
GFR, Estimated: 23 mL/min — ABNORMAL LOW (ref >60)
Glucose, Bld: 86 mg/dL (ref 70–99)
Potassium: 3.2 mmol/L — ABNORMAL LOW (ref 3.5–5.1)
Sodium: 135 mmol/L (ref 135–145)
Total Bilirubin: 0.3 mg/dL (ref 0.3–1.2)
Total Protein: 6.9 g/dL (ref 6.5–8.1)

## 2022-01-10 LAB — URINE CULTURE

## 2022-01-10 LAB — HEMOGLOBIN A1C
Hgb A1c MFr Bld: 6.3 % — ABNORMAL HIGH (ref 4.8–5.6)
Mean Plasma Glucose: 134 mg/dL

## 2022-01-10 LAB — GLUCOSE, RANDOM: Glucose, Bld: 101 mg/dL — ABNORMAL HIGH (ref 70–99)

## 2022-01-10 LAB — MAGNESIUM: Magnesium: 1.6 mg/dL — ABNORMAL LOW (ref 1.7–2.4)

## 2022-01-10 MED ORDER — POTASSIUM CHLORIDE CRYS ER 20 MEQ PO TBCR
40.0000 meq | EXTENDED_RELEASE_TABLET | Freq: Once | ORAL | Status: AC
  Administered 2022-01-10: 40 meq via ORAL
  Filled 2022-01-10: qty 2

## 2022-01-10 MED ORDER — TEMAZEPAM 15 MG PO CAPS
15.0000 mg | ORAL_CAPSULE | Freq: Once | ORAL | Status: AC
  Administered 2022-01-10: 15 mg via ORAL
  Filled 2022-01-10: qty 1

## 2022-01-10 NOTE — Assessment & Plan Note (Signed)
Continue Lipitor 20 mg daily. 

## 2022-01-10 NOTE — Assessment & Plan Note (Addendum)
Potassium of 3.2 on BMP on 3/16. Kdur given and now resolved. ?

## 2022-01-10 NOTE — Progress Notes (Signed)
? ?PROGRESS NOTE ? ? ? ?[name]  QPR:[id number] DOB: 1950-12-19 DOA: 01/09/2022 ?PCP: [name], MD ? ? ?Brief Narrative: ?[name] is a 71 y.o. female with a history of GERD, anxiety, hyperlipidemia, hypertension, diabetes mellitus, CKD stage 4, recent pulmonary embolism. Patient presented secondary to altered mental status/confusion/syncope and found to have severe hypoglycemia. D10 IV fluids initiated with improvement of symptoms. Does not seem quite at baseline at this time. EEG ordered and pending for possible seizure concern. ? ? ?Assessment and Plan: ?* Hypoglycemia ?Likely secondary to insulin therapy. Hemoglobin A1C of 6.3%. Patient started on D10 IV infusion with improvement of blood sugar. Patient can likely discontinue insulin therapy on discharge. ?-Discontinue D10 IV fluids ?-Continue CBG checks q2 hours with eventual decrease to q4 hours ? ?CKD (chronic kidney disease) stage 4, GFR 15-29 ml/min (HCC) ?Previously on hemodialysis, however now HD catheter removed and HD discontinued. Patient follows with nephrology as an outpatient. Creatinine currently stable. ? ?Type 2 diabetes mellitus with other specified complication ([place]) ?Hemoglobin A1C of 6.3%. Patient is on Levemir 11 units at night. Levemir held on admission secondary to hypoglycemia. ?-Hold insulin ?-Likely discontinue insulin on discharge ? ?Hypokalemia ?Potassium of 3.2 on BMP this morning. Kdur given this morning. ?-Check magnesium ? ?Syncope ?Appears to be secondary to hypoglycemic episode. EEG obtained for possible seizure activity. MRI unremarkable. ? ?Essential hypertension ?-Continue home amlodipine ? ?Pure hypercholesterolemia ?-Continue Lipitor 20 mg daily ? ?Gastroesophageal reflux disease ?-Continue home Protonix ? ?Anxiety ?-Continue Paxil ? ?History of pulmonary embolism ?-Continue Eliquis ? ? ? ?DVT prophylaxis: Eliquis ?Code Status:   Code Status: DNR ?Family Communication: Daughter on telephone ?Disposition  Plan: Discharge home likely in 1-2 days pending stable blood glucose ? ? ?Consultants:  ?None ? ?Procedures:  ?None ? ?Antimicrobials: ?None  ? ? ?Subjective: ?Patient reports no issues overnight. No issues this morning. ? ?Objective: ?BP (!) 152/81 (BP Location: Left Arm)   Pulse 90   Temp 98.5 ?F (36.9 ?C) (Oral)   Resp 15   Wt 66 kg   SpO2 100%   BMI 24.21 kg/m?  ? ?Examination: ? ?General exam: Appears calm and comfortable ?Respiratory system: Clear to auscultation. Respiratory effort normal. ?Cardiovascular system: S1 & S2 heard, RRR. No murmurs, rubs, gallops or clicks. ?Gastrointestinal system: Abdomen is nondistended, soft and nontender. Normal bowel sounds heard. ?Central nervous system: Alert and oriented to person. No focal neurological deficits. ?Musculoskeletal: No edema. No calf tenderness ?Skin: No cyanosis. No rashes ?Psychiatry: Judgement and insight appear normal. Mood & affect appropriate.  ? ? ?Data Reviewed: I have personally reviewed following labs and imaging studies ? ?CBC ?Lab Results  ?Component Value Date  ? WBC 7.8 01/10/2022  ? RBC 2.86 (L) 01/10/2022  ? HGB 8.1 (L) 01/10/2022  ? HCT 25.7 (L) 01/10/2022  ? MCV 89.9 01/10/2022  ? MCH 28.3 01/10/2022  ? PLT 355 01/10/2022  ? MCHC 31.5 01/10/2022  ? RDW 14.2 01/10/2022  ? LYMPHSABS 0.8 01/09/2022  ? MONOABS 0.4 01/09/2022  ? EOSABS 0.0 01/09/2022  ? BASOSABS 0.1 01/09/2022  ? ? ? ?Last metabolic panel ?Lab Results  ?Component Value Date  ? NA 135 01/10/2022  ? K 3.2 (L) 01/10/2022  ? CL 108 01/10/2022  ? CO2 18 (L) 01/10/2022  ? BUN 27 (H) 01/10/2022  ? CREATININE 2.18 (H) 01/10/2022  ? GLUCOSE 86 01/10/2022  ? GFRNONAA 23 (L) 01/10/2022  ? GFRAA 51 (L) 04/11/2019  ? CALCIUM 9.0 01/10/2022  ? PHOS  5.3 (H) 12/27/2021  ? PROT 6.9 01/10/2022  ? ALBUMIN 3.1 (L) 01/10/2022  ? BILITOT 0.3 01/10/2022  ? ALKPHOS 62 01/10/2022  ? AST 19 01/10/2022  ? ALT 17 01/10/2022  ? ANIONGAP 9 01/10/2022  ? ? ?GFR: ?Estimated Creatinine Clearance: 20.7  mL/min (A) (by C-G formula based on SCr of 2.18 mg/dL (H)). ? ?Recent Results (from the past 240 hour(s))  ?MRSA Next Gen by PCR, Nasal     Status: None  ? Collection Time: 01/09/22  6:35 PM  ? Specimen: Nasal Mucosa; Nasal Swab  ?Result Value Ref Range Status  ? MRSA by PCR Next Gen NOT DETECTED NOT DETECTED Final  ?  Comment: (NOTE) ?The GeneXpert MRSA Assay (FDA approved for NASAL specimens only), ?is one component of a comprehensive MRSA colonization surveillance ?program. It is not intended to diagnose MRSA infection nor to guide ?or monitor treatment for MRSA infections. ?Test performance is not FDA approved in patients less than 2 years ?old. ?Performed at [facility], [address] ?[place], [place] [address] ?  ?  ? ? ?Radiology Studies: ?CT Head Wo Contrast ? ?Result Date: 01/09/2022 ?CLINICAL DATA:  Altered mental status EXAM: CT HEAD WITHOUT CONTRAST TECHNIQUE: Contiguous axial images were obtained from the base of the skull through the vertex without intravenous contrast. RADIATION DOSE REDUCTION: This exam was performed according to the departmental dose-optimization program which includes automated exposure control, adjustment of the mA and/or kV according to patient size and/or use of iterative reconstruction technique. COMPARISON:  12/14/2021 FINDINGS: Brain: There are no signs of bleeding. Ventricles are unremarkable. There is no shift of midline structures. In the image 13 of series 2, there is subtle decreased density in the left temporal lobe. This finding is not evident in the immediate adjacent images. In the image 10, there is subtle decreased density in the left temporal lobe. Vascular: Unremarkable. Skull: No fracture is seen. Sinuses/Orbits: Unremarkable. Other: None IMPRESSION: There are no signs of bleeding within the cranium. There is no focal mass effect. There is subtle decreased density in the left temporal lobe which may suggest partial volume averaging artifact  or area of ischemia. If clinically warranted, follow-up MRI may be considered. Electronically Signed   By: Elmer Picker M.D.   On: 01/09/2022 12:18  ? ?MR BRAIN WO CONTRAST ? ?Result Date: 01/09/2022 ?CLINICAL DATA:  Delirium EXAM: MRI HEAD WITHOUT CONTRAST TECHNIQUE: Multiplanar, multiecho pulse sequences of the brain and surrounding structures were obtained without intravenous contrast. COMPARISON:  Same day head CT. FINDINGS: Brain: No acute infarction, hemorrhage, hydrocephalus, extra-axial collection or mass lesion. Mild scattered T2/FLAIR hyperintensities within the white matter and pons, nonspecific but compatible with chronic microvascular ischemic disease. Vascular: Major arterial flow voids are maintained skull base. Skull and upper cervical spine: Normal marrow signal. Sinuses/Orbits: Clear sinuses.  Unremarkable orbits. Other: No mastoid effusions. IMPRESSION: 1. No evidence of acute intracranial abnormality. 2. Mild chronic microvascular ischemic disease. Electronically Signed   By: Margaretha Sheffield M.D.   On: 01/09/2022 16:45  ? ?DG Chest Port 1 View ? ?Result Date: 01/09/2022 ?CLINICAL DATA:  Altered mental status EXAM: PORTABLE CHEST 1 VIEW COMPARISON:  Chest radiograph dated November 14, 2021 FINDINGS: The heart size and mediastinal contours are within normal limits. Atherosclerotic calcification of the aortic arch. Low lung volumes. Elevation of the right hemidiaphragm, unchanged. No focal consolidation or large pleural effusion. The visualized skeletal structures are unremarkable. IMPRESSION: No active disease. Electronically Signed   By: Wyatt Mage  Ahmed D.O.   On: 01/09/2022 11:46   ? ? ? LOS: 1 day  ? ? ?Cordelia Poche, MD ?[facility] ?01/10/2022, 8:20 AM ? ? ?If 7PM-7AM, please contact night-coverage ?[email or web address] ? ?

## 2022-01-10 NOTE — Assessment & Plan Note (Addendum)
Hemoglobin A1C of 6.3%. Patient is on Levemir 11 units at night. Levemir held on admission secondary to hypoglycemia. No fasting blood sugar available. ?-Discontinue insulin on discharge. ?-Carb modified diet ?

## 2022-01-10 NOTE — Assessment & Plan Note (Signed)
Previously on hemodialysis, however now HD catheter removed and HD discontinued. Patient follows with nephrology as an outpatient. Creatinine currently stable. ?

## 2022-01-10 NOTE — Assessment & Plan Note (Addendum)
Likely secondary to insulin therapy. Hemoglobin A1C of 6.3%. Patient started on D10 IV infusion with improvement of blood sugar. Patient managed off of D10 IV fluids, but developed recurrent hypoglycemia. D5 IV fluids initiated on 3/17 and eventually weaned off again. Cortisol low at 4.6 on 3/17 and improved to 10.4 on 3/18. Patient now without recurrent hypoglycemia. ?

## 2022-01-10 NOTE — Assessment & Plan Note (Signed)
-   Continue home Protonix 

## 2022-01-10 NOTE — Assessment & Plan Note (Signed)
Continue Paxil.

## 2022-01-10 NOTE — Assessment & Plan Note (Signed)
-  Continue home amlodipine 

## 2022-01-10 NOTE — Assessment & Plan Note (Signed)
-   Continue Eliquis 

## 2022-01-10 NOTE — Progress Notes (Signed)
EEG complete - results pending 

## 2022-01-10 NOTE — Progress Notes (Signed)
?  Transition of Care (TOC) Screening Note ? ? ?Patient Details  ?Name: [name] ?Date of Birth: Apr 11, 1951 ? ? ?Transition of Care [facility]) CM/SW Contact:    ?Dessa Phi, RN ?Phone Number: ?01/10/2022, 9:41 AM ? ? ? ?Transition of Care Department [facility]) has reviewed patient and no TOC needs have been identified at this time. We will continue to monitor patient advancement through interdisciplinary progression rounds. If new patient transition needs arise, please place a TOC consult. ?  ?

## 2022-01-10 NOTE — Progress Notes (Signed)
The patient had an episode of hypoglycemia, that did not respond initially to two cups of juice. After drinking a cup of ensure, the third 15 minute check showed a cbg of 76.  ?

## 2022-01-10 NOTE — Hospital Course (Addendum)
[name] is a 71 y.o. female with a history of GERD, anxiety, hyperlipidemia, hypertension, diabetes mellitus, CKD stage 4, recent pulmonary embolism. Patient presented secondary to altered mental status/confusion/syncope and found to have severe hypoglycemia. D10 IV fluids initiated with improvement of symptoms. Does not seem quite at baseline at this time. EEG ordered and pending for possible seizure concern. Patient with recurrent hypoglycemia which has now resolved. ?

## 2022-01-10 NOTE — Progress Notes (Signed)
? ?  Echocardiogram ?2D Echocardiogram has been performed. ? ?Beryle Beams ?01/10/2022, 8:14 AM ?

## 2022-01-10 NOTE — Assessment & Plan Note (Addendum)
Appears to be secondary to hypoglycemic episode. EEG obtained for possible seizure activity. MRI brain unremarkable. EEG without evidence of seizure activity. PT/OT recommending SNF. TOC consulted on 3/17 ?

## 2022-01-11 ENCOUNTER — Other Ambulatory Visit: Payer: Medicare Other

## 2022-01-11 DIAGNOSIS — E162 Hypoglycemia, unspecified: Secondary | ICD-10-CM | POA: Diagnosis not present

## 2022-01-11 DIAGNOSIS — I10 Essential (primary) hypertension: Secondary | ICD-10-CM | POA: Diagnosis not present

## 2022-01-11 DIAGNOSIS — R569 Unspecified convulsions: Secondary | ICD-10-CM | POA: Diagnosis not present

## 2022-01-11 DIAGNOSIS — R404 Transient alteration of awareness: Secondary | ICD-10-CM | POA: Diagnosis not present

## 2022-01-11 DIAGNOSIS — F419 Anxiety disorder, unspecified: Secondary | ICD-10-CM | POA: Diagnosis not present

## 2022-01-11 DIAGNOSIS — N184 Chronic kidney disease, stage 4 (severe): Secondary | ICD-10-CM | POA: Diagnosis not present

## 2022-01-11 DIAGNOSIS — L8990 Pressure ulcer of unspecified site, unspecified stage: Secondary | ICD-10-CM | POA: Diagnosis present

## 2022-01-11 LAB — BASIC METABOLIC PANEL
Anion gap: 7 (ref 5–15)
BUN: 22 mg/dL (ref 8–23)
CO2: 19 mmol/L — ABNORMAL LOW (ref 22–32)
Calcium: 9.1 mg/dL (ref 8.9–10.3)
Chloride: 107 mmol/L (ref 98–111)
Creatinine, Ser: 2.14 mg/dL — ABNORMAL HIGH (ref 0.44–1.00)
GFR, Estimated: 24 mL/min — ABNORMAL LOW (ref >60)
Glucose, Bld: 123 mg/dL — ABNORMAL HIGH (ref 70–99)
Potassium: 3.6 mmol/L (ref 3.5–5.1)
Sodium: 133 mmol/L — ABNORMAL LOW (ref 135–145)

## 2022-01-11 LAB — GLUCOSE, CAPILLARY
Glucose-Capillary: 65 mg/dL — ABNORMAL LOW (ref 70–99)
Glucose-Capillary: 78 mg/dL (ref 70–99)
Glucose-Capillary: 87 mg/dL (ref 70–99)
Glucose-Capillary: 103 mg/dL — ABNORMAL HIGH (ref 70–99)
Glucose-Capillary: 124 mg/dL — ABNORMAL HIGH (ref 70–99)
Glucose-Capillary: 129 mg/dL — ABNORMAL HIGH (ref 70–99)
Glucose-Capillary: 136 mg/dL — ABNORMAL HIGH (ref 70–99)

## 2022-01-11 LAB — CBC
HCT: 27.4 % — ABNORMAL LOW (ref 36.0–46.0)
Hemoglobin: 8.9 g/dL — ABNORMAL LOW (ref 12.0–15.0)
MCH: 28.8 pg (ref 26.0–34.0)
MCHC: 32.5 g/dL (ref 30.0–36.0)
MCV: 88.7 fL (ref 80.0–100.0)
Platelets: 395 10*3/uL (ref 150–400)
RBC: 3.09 MIL/uL — ABNORMAL LOW (ref 3.87–5.11)
RDW: 14.2 % (ref 11.5–15.5)
WBC: 8 10*3/uL (ref 4.0–10.5)
nRBC: 0 % (ref 0.0–0.2)

## 2022-01-11 LAB — CORTISOL: Cortisol, Plasma: 4.6 ug/dL

## 2022-01-11 MED ORDER — COSYNTROPIN 0.25 MG IJ SOLR: 0.2500 mg | Freq: Once | INTRAMUSCULAR | Status: DC

## 2022-01-11 MED ORDER — DEXTROSE-NACL 5-0.9 % IV SOLN: INTRAVENOUS | Status: DC

## 2022-01-11 MED ORDER — MUPIROCIN 2 % EX OINT
TOPICAL_OINTMENT | Freq: Two times a day (BID) | CUTANEOUS | Status: DC
  Administered 2022-01-11 – 2022-01-12 (×2): 1 via TOPICAL
  Filled 2022-01-11 (×2): qty 22

## 2022-01-11 MED ORDER — MAGNESIUM SULFATE 2 GM/50ML IV SOLN
2.0000 g | Freq: Once | INTRAVENOUS | Status: AC
  Administered 2022-01-11: 2 g via INTRAVENOUS
  Filled 2022-01-11: qty 50

## 2022-01-11 NOTE — Consult Note (Signed)
WOC Nurse Consult Note: ?Patient receiving care in [facility] ICU 1238. ?Reason for Consult: occipital wound with eschar ?Wound type: unstageable PI ?Pressure Injury POA: Yes ?Measurement: 3 cm x 4 cm eschar, which easily lifted off to reveal an area of pink hypergranulation tissue. Please note: the patient's hair is matted and really needs washing. ?Wound bed: pink hypergranulation tissue after eschar was easily removed with a cotton tipped applicator. ?Drainage (amount, consistency, odor) drops of blood ?Periwound: intact ?Dressing procedure/placement/frequency: ?Wash wound on occiput with soap and water, pat dry. Apply mupirocin. ? ?The patient tells me she fell in December and was hospitalized. When she fell she struck the back of her head. ? ?Monitor the wound area(s) for worsening of condition such as: ?Signs/symptoms of infection,  ?Increase in size,  ?Development of or worsening of odor, ?Development of pain, or increased pain at the affected locations.  Notify the medical team if any of these develop. ? ?Thank you for the consult.  Discussed plan of care with the patient.  [facility] nurse will not follow at this time.  Please re-consult the [place] team if needed. ? ?Val Riles, RN, MSN, CWOCN, CNS-BC, pager [phone]  ? ?  ?

## 2022-01-11 NOTE — Progress Notes (Signed)
Inpatient Diabetes Program Recommendations ? ?AACE/ADA: New Consensus Statement on Inpatient Glycemic Control (2015) ? ?Target Ranges:  Prepandial:   less than 140 mg/dL ?     Peak postprandial:   less than 180 mg/dL (1-2 hours) ?     Critically ill patients:  140 - 180 mg/dL  ? ?Lab Results  ?Component Value Date  ? GLUCAP 124 (H) 01/11/2022  ? HGBA1C 6.3 (H) 01/09/2022  ? ? ?Review of Glycemic Control ? ?Diabetes history: DM2 ?Outpatient Diabetes medications: Levemir 11 units QHS ?Current orders for Inpatient glycemic control: None ? ?HgbA1C - 6.3% ?Hypoglycemia on 3/16: 41, 42, 64 mg/dL ? ?Inpatient Diabetes Program Recommendations:   ? ?Would discontinue home Levemir 11 units QHS at discharge.  ? ?Continue to follow. ? ?Thank you. ?Lorenda Peck, RD, LDN, CDE ?Inpatient Diabetes Coordinator ?[phone]  ? ? ? ? ? ? ? ? ?

## 2022-01-11 NOTE — Progress Notes (Signed)
? ?PROGRESS NOTE ? ? ? ?[name]  IZT:[id number] DOB: [birth year] DOA: 01/09/2022 ?PCP: [name], MD ? ? ?Brief Narrative: ?[name] is a 71 y.o. female with a history of GERD, anxiety, hyperlipidemia, hypertension, diabetes mellitus, CKD stage 4, recent pulmonary embolism. Patient presented secondary to altered mental status/confusion/syncope and found to have severe hypoglycemia. D10 IV fluids initiated with improvement of symptoms. Does not seem quite at baseline at this time. EEG ordered and pending for possible seizure concern. Patient with recurrent hypoglycemia. ? ? ?Assessment and Plan: ?* Hypoglycemia ?Likely secondary to insulin therapy. Hemoglobin A1C of 6.3%. Patient started on D10 IV infusion with improvement of blood sugar. Patient managed off of D10 IV fluids, but developed recurrent hypoglycemia. D5 IV fluids initiated overnight. Cortisol low at 4.6 ?-Repeat Cortisol in AM with no IV fluids ?-Check CBG q4 hours; PRN for symptoms ?-Discontinue D5 IV fluids ? ?CKD (chronic kidney disease) stage 4, GFR 15-29 ml/min (HCC) ?Previously on hemodialysis, however now HD catheter removed and HD discontinued. Patient follows with nephrology as an outpatient. Creatinine currently stable. ? ?Type 2 diabetes mellitus with other specified complication ([place]) ?Hemoglobin A1C of 6.3%. Patient is on Levemir 11 units at night. Levemir held on admission secondary to hypoglycemia. Will recommend to discontinue insulin on discharge. ?-Carb modified diet ? ?Hypokalemia ?Potassium of 3.2 on BMP this morning. Kdur given. ?-Recheck BMP ? ?Syncope ?Appears to be secondary to hypoglycemic episode. EEG obtained for possible seizure activity. MRI brain unremarkable. EEG without evidence of seizure activity ?-PT/OT eval ? ?Essential hypertension ?-Continue home amlodipine ? ?Pure hypercholesterolemia ?-Continue Lipitor 20 mg daily ? ?Gastroesophageal reflux disease ?-Continue home Protonix ? ?Anxiety ?-Continue  Paxil ? ?Pressure injury of skin ?Occiput. ?-Follow-up WOC ? ?History of pulmonary embolism ?-Continue Eliquis ? ? ? ?DVT prophylaxis: Eliquis ?Code Status:   Code Status: DNR ?Family Communication: Daughter on telephone ?Disposition Plan: Discharge to SNF likely in 1-2 days pending stable blood glucose, PT/OT eval ? ? ?Consultants:  ?None ? ?Procedures:  ?None ? ?Antimicrobials: ?None  ? ? ?Subjective: ?Patient reports no concerns overnight. Overnight, patient was hypoglycemic again. ? ?Objective: ?BP 122/69 (BP Location: Right Arm)   Pulse 100   Temp 98.7 ?F (37.1 ?C) (Oral)   Resp 18   Wt 66.3 kg   SpO2 99%   BMI 24.32 kg/m?  ? ?Examination: ? ?General exam: Appears calm and comfortable ?Respiratory system: Clear to auscultation. Respiratory effort normal. ?Cardiovascular system: S1 & S2 heard, RRR. No murmurs, rubs, gallops or clicks. ?Gastrointestinal system: Abdomen is nondistended, soft and nontender. No organomegaly or masses felt. Normal bowel sounds heard. ?Central nervous system: Alert and oriented to person, place and year. ?Musculoskeletal: No edema. No calf tenderness ?Skin: No cyanosis. No rashes ?Psychiatry: Judgement and insight appear normal. Mood & affect appropriate.  ? ? ?Data Reviewed: I have personally reviewed following labs and imaging studies ? ?CBC ?Lab Results  ?Component Value Date  ? WBC 7.8 01/10/2022  ? RBC 2.86 (L) 01/10/2022  ? HGB 8.1 (L) 01/10/2022  ? HCT 25.7 (L) 01/10/2022  ? MCV 89.9 01/10/2022  ? MCH 28.3 01/10/2022  ? PLT 355 01/10/2022  ? MCHC 31.5 01/10/2022  ? RDW 14.2 01/10/2022  ? LYMPHSABS 0.8 01/09/2022  ? MONOABS 0.4 01/09/2022  ? EOSABS 0.0 01/09/2022  ? BASOSABS 0.1 01/09/2022  ? ? ? ?Last metabolic panel ?Lab Results  ?Component Value Date  ? NA 135 01/10/2022  ? K 3.2 (L) 01/10/2022  ? CL  108 01/10/2022  ? CO2 18 (L) 01/10/2022  ? BUN 27 (H) 01/10/2022  ? CREATININE 2.18 (H) 01/10/2022  ? GLUCOSE 101 (H) 01/10/2022  ? GFRNONAA 23 (L) 01/10/2022  ? GFRAA 51  (L) 04/11/2019  ? CALCIUM 9.0 01/10/2022  ? PHOS 5.3 (H) 12/27/2021  ? PROT 6.9 01/10/2022  ? ALBUMIN 3.1 (L) 01/10/2022  ? BILITOT 0.3 01/10/2022  ? ALKPHOS 62 01/10/2022  ? AST 19 01/10/2022  ? ALT 17 01/10/2022  ? ANIONGAP 9 01/10/2022  ? ? ?GFR: ?Estimated Creatinine Clearance: 20.7 mL/min (A) (by C-G formula based on SCr of 2.18 mg/dL (H)). ? ?Recent Results (from the past 240 hour(s))  ?Blood culture (routine x 2)     Status: None (Preliminary result)  ? Collection Time: 01/09/22 11:58 AM  ? Specimen: BLOOD  ?Result Value Ref Range Status  ? Specimen Description   Final  ?  BLOOD SITE NOT SPECIFIED ?Performed at [facility], [place] [address], [place], [place] [address] ?  ? Special Requests   Final  ?  BOTTLES DRAWN AEROBIC AND ANAEROBIC Blood Culture adequate volume ?Performed at [facility], [place] [address] [place], [place] [address] ?  ? Culture   Final  ?  NO GROWTH < 24 HOURS ?Performed at [facility], [place] [address], [place], [place] [address] ?  ? Report Status PENDING  Incomplete  ?Blood culture (routine x 2)     Status: None (Preliminary result)  ? Collection Time: 01/09/22 11:58 AM  ? Specimen: BLOOD  ?Result Value Ref Range Status  ? Specimen Description   Final  ?  BLOOD SITE NOT SPECIFIED ?Performed at [facility], [place] [address], [place], [place] [address] ?  ? Special Requests   Final  ?  BOTTLES DRAWN AEROBIC AND ANAEROBIC Blood Culture results may not be optimal due to an inadequate volume of blood received in culture bottles ?Performed at [facility], [place] [address], [place], [place] [address] ?  ? Culture   Final  ?  NO GROWTH < 24 HOURS ?Performed at [facility], [place] [address], [place], [place] [address] ?  ? Report Status PENDING  Incomplete  ?Urine Culture     Status: Abnormal  ? Collection Time: 01/09/22 12:22 PM  ? Specimen: Urine, Clean Catch  ?Result Value Ref Range Status  ? Specimen  Description   Final  ?  URINE, CLEAN CATCH ?Performed at [facility], [place] [address] [place], [place] [address] ?  ? Special Requests   Final  ?  NONE ?Performed at [facility], [place] [address], [place], [place] [address] ?  ? Culture MULTIPLE SPECIES PRESENT, SUGGEST RECOLLECTION (A)  Final  ? Report Status 01/10/2022 FINAL  Final  ?MRSA Next Gen by PCR, Nasal     Status: None  ? Collection Time: 01/09/22  6:35 PM  ? Specimen: Nasal Mucosa; Nasal Swab  ?Result Value Ref Range Status  ? MRSA by PCR Next Gen NOT DETECTED NOT DETECTED Final  ?  Comment: (NOTE) ?The GeneXpert MRSA Assay (FDA approved for NASAL specimens only), ?is one component of a comprehensive MRSA colonization surveillance ?program. It is not intended to diagnose MRSA infection nor to guide ?or monitor treatment for MRSA infections. ?Test performance is not FDA approved in patients less than 2 years ?old. ?Performed at [facility], [place] [address] ?[place],  [address] ?  ?  ? ? ?  Radiology Studies: ?CT Head Wo Contrast ? ?Result Date: 01/09/2022 ?CLINICAL DATA:  Altered mental status EXAM: CT HEAD WITHOUT CONTRAST TECHNIQUE: Contiguous axial images were obtained from the base of the skull through the vertex without intravenous contrast. RADIATION DOSE REDUCTION: This exam was performed according to the departmental dose-optimization program which includes automated exposure control, adjustment of the mA and/or kV according to patient size and/or use of iterative reconstruction technique. COMPARISON:  12/14/2021 FINDINGS: Brain: There are no signs of bleeding. Ventricles are unremarkable. There is no shift of midline structures. In the image 13 of series 2, there is subtle decreased density in the left temporal lobe. This finding is not evident in the immediate adjacent images. In the image 10, there is subtle decreased density in the left temporal lobe. Vascular: Unremarkable. Skull:  No fracture is seen. Sinuses/Orbits: Unremarkable. Other: None IMPRESSION: There are no signs of bleeding within the cranium. There is no focal mass effect. There is subtle decreased density in the left temporal lobe

## 2022-01-11 NOTE — Assessment & Plan Note (Addendum)
Occiput. Wound care consulted with recommendations (3/17): ?1. Wash wound on occiput with soap and water, pat dry. Apply mupirocin. ?

## 2022-01-11 NOTE — Procedures (Signed)
Patient Name: KENNETHA PEARMAN  ?MRN: [id number]  ?Epilepsy Attending: Lora Havens  ?Referring Physician/Provider: Jonnie Finner, DO ?Date: 01/10/2022 ?Duration: 22.46 mins ? ?Patient history: 71yo F with syncope and seizure like activity. EEG to evaluate for seizure ? ?Level of alertness: Awake ? ?AEDs during EEG study: None ? ?Technical aspects: This EEG study was done with scalp electrodes positioned according to the 10-20 International system of electrode placement. Electrical activity was acquired at a sampling rate of '500Hz'$  and reviewed with a high frequency filter of '70Hz'$  and a low frequency filter of '1Hz'$ . EEG data were recorded continuously and digitally stored.  ? ?Description: The posterior dominant rhythm consists of '8Hz'$  activity of moderate voltage (25-35 uV) seen predominantly in posterior head regions, symmetric and reactive to eye opening and eye closing. EEG showed intermittent generalized 3 to 6 Hz theta-delta slowing, at times with triphasic morphology. Hyperventilation and photic stimulation were not performed.    ? ?Of note, study was technically difficult due to significant electrode and movement artifact. ? ?ABNORMALITY ?- Intermittent slow, generalized ? ?IMPRESSION: ?This technically difficult study is suggestive of mild diffuse encephalopathy, nonspecific etiology. No seizures or epileptiform discharges were seen throughout the recording. ? ?Lora Havens  ? ?

## 2022-01-11 NOTE — Progress Notes (Signed)
Patient had an episode of hypoglycemia at 63, gave juice and started D5. After second glucose check patient slowed cbg of 78.  ?

## 2022-01-11 NOTE — Evaluation (Signed)
Occupational Therapy Evaluation ?Patient Details ?Name: [name] ?MRN: [id number] ?DOB: 05-10-1951 ?Today's Date: 01/11/2022 ? ? ?History of Present Illness [name] is a 71 y.o. female with a history of GERD, anxiety, hyperlipidemia, hypertension, diabetes mellitus, CKD stage 4, recent pulmonary embolism. Patient presented secondary to altered mental status/confusion/syncope and found to have severe hypoglycemia. Recently admitted to [facility] 10/28/21 after cardiac arrest requiring 20-min CPR for. Workup for STEMI, cardiogenic shock, large saddle PE, RUQ bleeding, 2 rib fxs. S/p PE thrombectomy 1/2. Pt with intraperitoneal hemorrhage s/p embolization 1/5. ETT 1/1-1/10. S/p L femoral non-tunneled HD catheter placed 1/4. CRRT 1/4-1/11; trialled iHD 1/12; CRRT again 1/15-1/18. Began iHD again 1/20. S/p RIJ tunneled HD catheter placed 1/24. DC'd to inpatient rehab on 2/8 thru 3/13.  ? ?Clinical Impression ?  ?[name] is a 71 year old woman who presents with generalized weakness, impaired cognition, decreased activity tolerance, impaired balance and right ankle pain. Patient recently discharged from inpatient rehab (3/13) and has been limited to mostly transfers and few steps due to right ankle/foot pain and needing max assist for LB ADLs. Patient's daughter reports she is more confused than normal today. Today she was alert to self, place, year and President but not to situation or recent timeline of events. She was supervision to transfer to side of bed with increased time and reliance on bed rail, min assist to stand and take steps to recliner with RW. She doesn't use the walker effectively enough to reduce weight on painful ankle. She is able to perform UB Adls with setup and supervision but max assist for LB ADLs. Patient will benefit from skilled OT services while in hospital to improve deficits and learn compensatory strategies as needed in order to reduce caregiver burden and improve patient's  functional abilities.  Patient's daughter reports she hadn't started Home therapy yet but is wanting more rehab prior to return home. Prior to January - patient independent and going to the gym. Will recommend short term rehab at discharge.    ?   ? ?Recommendations for follow up therapy are one component of a multi-disciplinary discharge planning process, led by the attending physician.  Recommendations may be updated based on patient status, additional functional criteria and insurance authorization.  ? ?Follow Up Recommendations ? Skilled nursing-short term rehab (<3 hours/day)  ?  ?Assistance Recommended at Discharge Frequent or constant Supervision/Assistance  ?Patient can return home with the following A little help with walking and/or transfers;A lot of help with bathing/dressing/bathroom;Assistance with cooking/housework;Direct supervision/assist for financial management;Direct supervision/assist for medications management ? ?  ?Functional Status Assessment ? Patient has had a recent decline in their functional status and demonstrates the ability to make significant improvements in function in a reasonable and predictable amount of time.  ?Equipment Recommendations ? None recommended by OT  ?  ?Recommendations for Other Services   ? ? ?  ?Precautions / Restrictions Precautions ?Precautions: Fall ?Precaution Comments: R ankle pain since January - No fracture or dislocation is seen. There is soft tissue swelling  around the ankle. Plantar spur is seen in calcaneus. ?Restrictions ?Weight Bearing Restrictions: No  ? ?  ? ?   ?Balance Overall balance assessment: Needs assistance ?Sitting-balance support: No upper extremity supported ?Sitting balance-Leahy Scale: Fair ?  ?  ?Standing balance support: Reliant on assistive device for balance ?Standing balance-Leahy Scale: Poor ?  ?  ?  ?  ?  ?  ?  ?  ?  ?  ?  ?  ?   ? ?  ADL either performed or assessed with clinical judgement  ? ?ADL Overall ADL's : Needs  assistance/impaired ?Eating/Feeding: Set up;Sitting ?  ?Grooming: Set up;Sitting ?  ?Upper Body Bathing: Set up;Sitting ?  ?Lower Body Bathing: Maximal assistance;Sitting/lateral leans ?  ?Upper Body Dressing : Set up;Sitting ?  ?Lower Body Dressing: Maximal assistance;Sit to/from stand ?  ?Toilet Transfer: Minimal assistance;BSC/3in1;Rolling walker (2 wheels) ?  ?Toileting- Clothing Manipulation and Hygiene: Maximal assistance;Sit to/from stand ?  ?  ?  ?Functional mobility during ADLs: Minimal assistance;Rolling walker (2 wheels) ?General ADL Comments: Reliant on walker, limited by RLE pain, only able to take a couple of steps.  ? ? ? ?Vision Baseline Vision/History: 1 Wears glasses ?Vision Assessment?: No apparent visual deficits  ?   ?Perception   ?  ?Praxis   ?  ? ?Pertinent Vitals/Pain Pain Assessment ?Pain Assessment: PAINAD ?Breathing: normal ?Negative Vocalization: occasional moan/groan, low speech, negative/disapproving quality ?Facial Expression: smiling or inexpressive ?Body Language: relaxed ?Consolability: no need to console ?PAINAD Score: 1 ?Pain Intervention(s): Monitored during session  ? ? ? ?Hand Dominance Right ?  ?Extremity/Trunk Assessment Upper Extremity Assessment ?Upper Extremity Assessment: Overall WFL for tasks assessed (WFL ROM, grossly 4/5 strength, no apparent coordination or sensation deficits.) ?  ?Lower Extremity Assessment ?Lower Extremity Assessment: Defer to PT evaluation ?  ?Cervical / Trunk Assessment ?Cervical / Trunk Assessment: Normal ?  ?Communication Communication ?Communication: No difficulties ?  ?Cognition Arousal/Alertness: Awake/alert ?Behavior During Therapy: [facility] for tasks assessed/performed ?Overall Cognitive Status: Impaired/Different from baseline ?Area of Impairment: Orientation ?  ?  ?  ?  ?  ?  ?  ?  ?Orientation Level: Disoriented to, Time, Situation ?  ?  ?  ?  ?  ?  ?General Comments: Alert to self, year, President and place. Patient confused in regards to  recent timeline of events. Able to follow all commands. Daughter (on phone) says she is more confused. ?  ?  ?General Comments    ? ?  ?Exercises   ?  ?Shoulder Instructions    ? ? ?Home Living Family/patient expects to be discharged to:: Private residence ?Living Arrangements: Children ?Available Help at Discharge: Family;Available 24 hours/day ?Type of Home: House ?Home Access: Stairs to enter ?Entrance Stairs-Number of Steps: 1 ?Entrance Stairs-Rails: None ?Home Layout: Multi-level ?Alternate Level Stairs-Number of Steps: 6 stairs, landing, then 6 more ?Alternate Level Stairs-Rails: Left ?Bathroom Shower/Tub: Tub/shower unit ?  ?Bathroom Toilet: Handicapped height ?  ?  ?Home Equipment: Shower seat;Cane - quad;BSC/3in1;Hand held Engineering geologist (2 wheels);Wheelchair - manual ?  ?Additional Comments: May have a stair lift? Once upstairs - patient able to stay. ? Lives With: Daughter ? ?  ?Prior Functioning/Environment Prior Level of Function : Needs assist ?  ?  ?  ?  ?  ?  ?Mobility Comments: min assist - just transferring and taking a couple of steps ?ADLs Comments: Able to perform UB ADLs with set up and supervision, needs max assist for LB ADLs ?  ? ?  ?  ?OT Problem List: Decreased activity tolerance;Impaired balance (sitting and/or standing);Decreased cognition;Decreased safety awareness;Decreased knowledge of use of DME or AE;Pain;Decreased strength ?  ?   ?OT Treatment/Interventions: Self-care/ADL training;Therapeutic exercise;DME and/or AE instruction;Therapeutic activities;Balance training;Patient/family education  ?  ?OT Goals(Current goals can be found in the care plan section) Acute Rehab OT Goals ?Patient Stated Goal: To improve mobility and independence with ADLs ?OT Goal Formulation: With family ?Time For Goal Achievement: 01/25/22 ?Potential to Achieve Goals: Fair  ?  OT Frequency: Min 2X/week ?  ? ?Co-evaluation   ?  ?  ?  ?  ? ?  ?AM-PAC OT "6 Clicks" Daily Activity     ?Outcome Measure  Help from another person eating meals?: A Little ?Help from another person taking care of personal grooming?: A Little ?Help from another person toileting, which includes using toliet, bedpan, or urinal?: A Lot ?He

## 2022-01-12 ENCOUNTER — Other Ambulatory Visit: Payer: Self-pay

## 2022-01-12 ENCOUNTER — Encounter (HOSPITAL_COMMUNITY): Payer: Self-pay | Admitting: Internal Medicine

## 2022-01-12 DIAGNOSIS — N184 Chronic kidney disease, stage 4 (severe): Secondary | ICD-10-CM | POA: Diagnosis not present

## 2022-01-12 DIAGNOSIS — E162 Hypoglycemia, unspecified: Secondary | ICD-10-CM | POA: Diagnosis not present

## 2022-01-12 DIAGNOSIS — I10 Essential (primary) hypertension: Secondary | ICD-10-CM | POA: Diagnosis not present

## 2022-01-12 DIAGNOSIS — F419 Anxiety disorder, unspecified: Secondary | ICD-10-CM | POA: Diagnosis not present

## 2022-01-12 LAB — GLUCOSE, CAPILLARY
Glucose-Capillary: 107 mg/dL — ABNORMAL HIGH (ref 70–99)
Glucose-Capillary: 126 mg/dL — ABNORMAL HIGH (ref 70–99)
Glucose-Capillary: 131 mg/dL — ABNORMAL HIGH (ref 70–99)
Glucose-Capillary: 139 mg/dL — ABNORMAL HIGH (ref 70–99)
Glucose-Capillary: 151 mg/dL — ABNORMAL HIGH (ref 70–99)
Glucose-Capillary: 157 mg/dL — ABNORMAL HIGH (ref 70–99)
Glucose-Capillary: 166 mg/dL — ABNORMAL HIGH (ref 70–99)

## 2022-01-12 LAB — CORTISOL-AM, BLOOD: Cortisol - AM: 10.4 ug/dL (ref 6.7–22.6)

## 2022-01-12 NOTE — Progress Notes (Signed)
Pt notified of transfer. Pt's belongings collected, Report was callled. Pt transferred to [place]. Pt stable at time of transfer ?

## 2022-01-12 NOTE — TOC Progression Note (Signed)
Transition of Care (TOC) - Progression Note  ? ? ?Patient Details  ?Name: [name] ?MRN: [id number] ?Date of Birth: Sep 23, 1951 ? ?Transition of Care (TOC) CM/SW Contact  ?Devyne Hauger, Marta Lamas, LCSW ?Phone Number: ?01/12/2022, 3:17 PM ? ?Clinical Narrative:    ? ?Received consult for SNF placement.  Spoke with patient, and daughter, Lolita Patella, both denying the need for SNF placement for short-term rehabilitative services.  LCSW then offered to resume HHPT/HHOT/HHSLP/ HHAIDE services for patient through [facility], for which patient and Ms. Mix agreed to consider.  Patient, nor Ms. Mix wanted to "commit" to making any decisions today.  LCSW noted that patient was recently discharged from inpatient rehab, from 02/08 through 03/13.   ? ? ?Expected Discharge Plan and Services ?  ? ?Home with HHPT/HHOT/HHSLP/HHAIDE through [facility] versus short-term SNF for rehabilitative services. ? ?Social Determinants of Health (SDOH) Interventions ?  ? ?Readmission Risk Interventions ?Readmission Risk Prevention Plan 01/10/2022 12/04/2021 11/02/2021  ?Transportation Screening Complete Complete Complete  ?PCP or Specialist Appt within 3-5 Days Complete Not Complete -  ?Not Complete comments - INPT rehab -  ?[place] or Home Care Consult Complete Complete -  ?Social Work Consult for [place] Planning/Counseling Complete Complete -  ?Palliative Care Screening Complete Complete -  ?Medication Review Press photographer) Complete Complete -  ?Some recent data might be hidden  ? ? ?
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
## 2022-01-12 NOTE — Progress Notes (Signed)
? ?PROGRESS NOTE ? ? ? ?TZIPPY TESTERMAN  GEX:[id number] DOB: December 18, 1950 DOA: 01/09/2022 ?PCP: [name], MD ? ? ?Brief Narrative: ?[name] is a 71 y.o. female with a history of GERD, anxiety, hyperlipidemia, hypertension, diabetes mellitus, CKD stage 4, recent pulmonary embolism. Patient presented secondary to altered mental status/confusion/syncope and found to have severe hypoglycemia. D10 IV fluids initiated with improvement of symptoms. Does not seem quite at baseline at this time. EEG ordered and pending for possible seizure concern. Patient with recurrent hypoglycemia which has now seemingly resolved. ? ? ?Assessment and Plan: ?* Hypoglycemia ?Likely secondary to insulin therapy. Hemoglobin A1C of 6.3%. Patient started on D10 IV infusion with improvement of blood sugar. Patient managed off of D10 IV fluids, but developed recurrent hypoglycemia. D5 IV fluids initiated on 3/17 and eventually weaned off again. Cortisol low at 4.6 on 3/17 and improved to 10.4 on 3/18. Patient now without recurrent hypoglycemia. ?-Check CBG q4 hours; PRN for symptoms ? ?CKD (chronic kidney disease) stage 4, GFR 15-29 ml/min (HCC) ?Previously on hemodialysis, however now HD catheter removed and HD discontinued. Patient follows with nephrology as an outpatient. Creatinine currently stable. ? ?Type 2 diabetes mellitus with other specified complication ([place]) ?Hemoglobin A1C of 6.3%. Patient is on Levemir 11 units at night. Levemir held on admission secondary to hypoglycemia. Will recommend to discontinue insulin on discharge. ?-Carb modified diet ? ?Syncope ?Appears to be secondary to hypoglycemic episode. EEG obtained for possible seizure activity. MRI brain unremarkable. EEG without evidence of seizure activity. PT/OT recommending SNF. TOC consulted on 3/17 ? ?Essential hypertension ?-Continue home amlodipine ? ?Pure hypercholesterolemia ?-Continue Lipitor 20 mg daily ? ?Gastroesophageal reflux disease ?-Continue home  Protonix ? ?Anxiety ?-Continue Paxil ? ?Hypokalemia-resolved as of 01/12/2022 ?Potassium of 3.2 on BMP on 3/16. Kdur given and now resolved. ? ?Pressure injury of skin ?Occiput. Wound care consulted with recommendations (3/17): ?Wash wound on occiput with soap and water, pat dry. Apply mupirocin. ? ?History of pulmonary embolism ?-Continue Eliquis ? ? ? ?DVT prophylaxis: Eliquis ?Code Status:   Code Status: DNR ?Family Communication: Daughter on telephone ?Disposition Plan: Discharge to SNF when bed is available. Medically stable for discharge. ? ? ?Consultants:  ?None ? ?Procedures:  ?None ? ?Antimicrobials: ?None  ? ? ?Subjective: ?No issues today. ? ?Objective: ?BP 138/78 (BP Location: Left Arm)   Pulse 92   Temp 98.1 ?F (36.7 ?C) (Oral)   Resp 15   Wt 66.3 kg   SpO2 100%   BMI 24.32 kg/m?  ? ?Examination: ? ?General exam: Appears calm and comfortable ?Respiratory system: Clear to auscultation. Respiratory effort normal. ?Cardiovascular system: S1 & S2 heard, RRR. No murmurs, rubs, gallops or clicks. ?Gastrointestinal system: Abdomen is nondistended, soft and nontender. No organomegaly or masses felt. Normal bowel sounds heard. ?Central nervous system: Alert and oriented to person, place and year. ?Musculoskeletal: No edema. No calf tenderness ?Skin: No cyanosis. No rashes ?Psychiatry: Judgement and insight appear normal. Mood & affect appropriate.  ? ? ?Data Reviewed: I have personally reviewed following labs and imaging studies ? ?CBC ?Lab Results  ?Component Value Date  ? WBC 8.0 01/11/2022  ? RBC 3.09 (L) 01/11/2022  ? HGB 8.9 (L) 01/11/2022  ? HCT 27.4 (L) 01/11/2022  ? MCV 88.7 01/11/2022  ? MCH 28.8 01/11/2022  ? PLT 395 01/11/2022  ? MCHC 32.5 01/11/2022  ? RDW 14.2 01/11/2022  ? LYMPHSABS 0.8 01/09/2022  ? MONOABS 0.4 01/09/2022  ? EOSABS 0.0 01/09/2022  ? BASOSABS 0.1  01/09/2022  ? ? ? ?Last metabolic panel ?Lab Results  ?Component Value Date  ? NA 133 (L) 01/11/2022  ? K 3.6 01/11/2022  ? CL 107  01/11/2022  ? CO2 19 (L) 01/11/2022  ? BUN 22 01/11/2022  ? CREATININE 2.14 (H) 01/11/2022  ? GLUCOSE 123 (H) 01/11/2022  ? GFRNONAA 24 (L) 01/11/2022  ? GFRAA 51 (L) 04/11/2019  ? CALCIUM 9.1 01/11/2022  ? PHOS 5.3 (H) 12/27/2021  ? PROT 6.9 01/10/2022  ? ALBUMIN 3.1 (L) 01/10/2022  ? BILITOT 0.3 01/10/2022  ? ALKPHOS 62 01/10/2022  ? AST 19 01/10/2022  ? ALT 17 01/10/2022  ? ANIONGAP 7 01/11/2022  ? ? ?GFR: ?Estimated Creatinine Clearance: 21.1 mL/min (A) (by C-G formula based on SCr of 2.14 mg/dL (H)). ? ?Recent Results (from the past 240 hour(s))  ?Blood culture (routine x 2)     Status: None (Preliminary result)  ? Collection Time: 01/09/22 11:58 AM  ? Specimen: BLOOD  ?Result Value Ref Range Status  ? Specimen Description   Final  ?  BLOOD SITE NOT SPECIFIED ?Performed at [facility], [place] [address], [place], [place] [address] ?  ? Special Requests   Final  ?  BOTTLES DRAWN AEROBIC AND ANAEROBIC Blood Culture adequate volume ?Performed at [facility], [place] [address], [place], [place] [address] ?  ? Culture   Final  ?  NO GROWTH 3 DAYS ?Performed at [facility], [place] [address] [place] [address], [place], [place] [address] ?  ? Report Status PENDING  Incomplete  ?Blood culture (routine x 2)     Status: None (Preliminary result)  ? Collection Time: 01/09/22 11:58 AM  ? Specimen: BLOOD  ?Result Value Ref Range Status  ? Specimen Description   Final  ?  BLOOD SITE NOT SPECIFIED ?Performed at [facility], [place] [address] [place], [place] [address] ?  ? Special Requests   Final  ?  BOTTLES DRAWN AEROBIC AND ANAEROBIC Blood Culture results may not be optimal due to an inadequate volume of blood received in culture bottles ?Performed at [facility], [place] [address], [place], [place] [address] ?  ? Culture   Final  ?  NO GROWTH 3 DAYS ?Performed at [facility], [place] [address] [place], [place] [address] ?  ? Report Status PENDING  Incomplete   ?Urine Culture     Status: Abnormal  ? Collection Time: 01/09/22 12:22 PM  ? Specimen: Urine, Clean Catch  ?Result Value Ref Range Status  ? Specimen Description   Final  ?  URINE, CLEAN CATCH ?Performed at [facility], [place] [address] [place], [place] [address] ?  ? Special Requests   Final  ?  NONE ?Performed at [facility], [place] [address] [place],  [address] ?  ? Culture MULTIPLE SPECIES PRESENT, SUGGEST RECOLLECTION (A)  Final  ? Report Status 01/10/2022 FINAL  Final  ?MRSA Next Gen by PCR, Nasal     Status: None  ? Collection Time: 01/09/22  6:35 PM  ? Specimen: Nasal Mucosa; Nasal Swab  ?Result Value Ref Range Status  ? MRSA by PCR Next Gen NOT DETECTED NOT DETECTED Final  ?  Comment: (NOTE) ?The GeneXpert MRSA Assay (FDA approved for NASAL specimens only), ?is one component of a comprehensive MRSA colonization surveillance ?program. It is not intended to diagnose MRSA infection nor to guide ?or monitor treatment for MRSA infections. ?Test performance is not FDA  approved in patients less than 2 years ?old. ?Performed at [facility], [place] [address] ?[place], [place] [address] ?  ?  ? ? ?Radiology Studies: ?EEG adult ? ?Result Date: 01/11/2022 ?Lora Havens, MD     01/11/2022  8:13 AM Patient Name: [name] MRN: [id number] Epilepsy Attending: Lora Havens Referring Physician/Provider: Jonnie Finner, DO Date: 01/10/2022 Duration: 22.46 mins Patient history: 71yo F with syncope and seizure like activity. EEG to evaluate for seizure Level of alertness: Awake AEDs during EEG study: None Technical aspects: This EEG study was done with scalp electrodes positioned according to the 10-20 International system of electrode placement. Electrical activity was acquired at a sampling rate of '500Hz'$  and reviewed with a high frequency filter of '70Hz'$  and a low frequency filter of '1Hz'$ . EEG data were recorded continuously and digitally stored.  Description: The posterior dominant rhythm consists of '8Hz'$  activity of moderate voltage (25-35 uV) seen predominantly in posterior head regions, symmetric and reactive to eye opening and eye closing. EEG showed intermitt

## 2022-01-13 DIAGNOSIS — F419 Anxiety disorder, unspecified: Secondary | ICD-10-CM | POA: Diagnosis not present

## 2022-01-13 DIAGNOSIS — I10 Essential (primary) hypertension: Secondary | ICD-10-CM | POA: Diagnosis not present

## 2022-01-13 DIAGNOSIS — E162 Hypoglycemia, unspecified: Secondary | ICD-10-CM | POA: Diagnosis not present

## 2022-01-13 DIAGNOSIS — N184 Chronic kidney disease, stage 4 (severe): Secondary | ICD-10-CM | POA: Diagnosis not present

## 2022-01-13 LAB — GLUCOSE, CAPILLARY
Glucose-Capillary: 142 mg/dL — ABNORMAL HIGH (ref 70–99)
Glucose-Capillary: 140 mg/dL — ABNORMAL HIGH (ref 70–99)

## 2022-01-13 MED ORDER — LIDOCAINE 5 % EX PTCH
1.0000 | MEDICATED_PATCH | CUTANEOUS | Status: DC
  Administered 2022-01-13 – 2022-01-16 (×3): 1 via TRANSDERMAL
  Filled 2022-01-13 (×4): qty 1

## 2022-01-13 NOTE — Progress Notes (Signed)
? ?PROGRESS NOTE ? ? ? ?[name]  NWG:[id number] DOB: [birth year] DOA: 01/09/2022 ?PCP: [name], MD ? ? ?Brief Narrative: ?[name] is a 71 y.o. female with a history of GERD, anxiety, hyperlipidemia, hypertension, diabetes mellitus, CKD stage 4, recent pulmonary embolism. Patient presented secondary to altered mental status/confusion/syncope and found to have severe hypoglycemia. D10 IV fluids initiated with improvement of symptoms. Does not seem quite at baseline at this time. EEG ordered and pending for possible seizure concern. Patient with recurrent hypoglycemia which has now seemingly resolved. ? ? ?Assessment and Plan: ?* Hypoglycemia-resolved as of 01/13/2022 ?Likely secondary to insulin therapy. Hemoglobin A1C of 6.3%. Patient started on D10 IV infusion with improvement of blood sugar. Patient managed off of D10 IV fluids, but developed recurrent hypoglycemia. D5 IV fluids initiated on 3/17 and eventually weaned off again. Cortisol low at 4.6 on 3/17 and improved to 10.4 on 3/18. Patient now without recurrent hypoglycemia. ? ?CKD (chronic kidney disease) stage 4, GFR 15-29 ml/min (HCC) ?Previously on hemodialysis, however now HD catheter removed and HD discontinued. Patient follows with nephrology as an outpatient. Creatinine currently stable. ? ?Type 2 diabetes mellitus with other specified complication ([place]) ?Hemoglobin A1C of 6.3%. Patient is on Levemir 11 units at night. Levemir held on admission secondary to hypoglycemia.  ?-Discontinue insulin on discharge. ?-Carb modified diet ? ?Syncope ?Appears to be secondary to hypoglycemic episode. EEG obtained for possible seizure activity. MRI brain unremarkable. EEG without evidence of seizure activity. PT/OT recommending SNF. TOC consulted on 3/17 ? ?Essential hypertension ?-Continue home amlodipine ? ?Pure hypercholesterolemia ?-Continue Lipitor 20 mg daily ? ?Gastroesophageal reflux disease ?-Continue home Protonix ? ?Anxiety ?-Continue  Paxil ? ?Hypokalemia-resolved as of 01/12/2022 ?Potassium of 3.2 on BMP on 3/16. Kdur given and now resolved. ? ?Pressure injury of skin ?Occiput. Wound care consulted with recommendations (3/17): ?Wash wound on occiput with soap and water, pat dry. Apply mupirocin. ? ?History of pulmonary embolism ?-Continue Eliquis ? ? ? ?DVT prophylaxis: Eliquis ?Code Status:   Code Status: DNR ?Family Communication: None at bedside ?Disposition Plan: Discharge to SNF when bed is available. Medically stable for discharge. ? ? ?Consultants:  ?None ? ?Procedures:  ?None ? ?Antimicrobials: ?None  ? ? ?Subjective: ?Some right heel pain. No other concerns. ? ?Objective: ?BP (!) 143/79 (BP Location: Left Arm)   Pulse 98   Temp 97.9 ?F (36.6 ?C) (Oral)   Resp 16   Ht '5\' 5"'$  (1.651 m)   Wt 63.5 kg   SpO2 100%   BMI 23.30 kg/m?  ? ?Examination: ? ?General exam: Appears calm and comfortable ?Respiratory system: Clear to auscultation. Respiratory effort normal. ?Cardiovascular system: S1 & S2 heard, RRR. No murmurs, rubs, gallops or clicks. ?Gastrointestinal system: Abdomen is nondistended, soft and nontender. No organomegaly or masses felt. Normal bowel sounds heard. ?Central nervous system: Alert and oriented to person, place and year. No focal neurological deficits. ?Musculoskeletal: No edema. No calf tenderness. Mild tenderness over achilles tendon insertion ?Skin: No cyanosis. No rashes ? ? ?Data Reviewed: I have personally reviewed following labs and imaging studies ? ?CBC ?Lab Results  ?Component Value Date  ? WBC 8.0 01/11/2022  ? RBC 3.09 (L) 01/11/2022  ? HGB 8.9 (L) 01/11/2022  ? HCT 27.4 (L) 01/11/2022  ? MCV 88.7 01/11/2022  ? MCH 28.8 01/11/2022  ? PLT 395 01/11/2022  ? MCHC 32.5 01/11/2022  ? RDW 14.2 01/11/2022  ? LYMPHSABS 0.8 01/09/2022  ? MONOABS 0.4 01/09/2022  ? EOSABS 0.0  01/09/2022  ? BASOSABS 0.1 01/09/2022  ? ? ? ?Last metabolic panel ?Lab Results  ?Component Value Date  ? NA 133 (L) 01/11/2022  ? K 3.6  01/11/2022  ? CL 107 01/11/2022  ? CO2 19 (L) 01/11/2022  ? BUN 22 01/11/2022  ? CREATININE 2.14 (H) 01/11/2022  ? GLUCOSE 123 (H) 01/11/2022  ? GFRNONAA 24 (L) 01/11/2022  ? GFRAA 51 (L) 04/11/2019  ? CALCIUM 9.1 01/11/2022  ? PHOS 5.3 (H) 12/27/2021  ? PROT 6.9 01/10/2022  ? ALBUMIN 3.1 (L) 01/10/2022  ? BILITOT 0.3 01/10/2022  ? ALKPHOS 62 01/10/2022  ? AST 19 01/10/2022  ? ALT 17 01/10/2022  ? ANIONGAP 7 01/11/2022  ? ? ?GFR: ?Estimated Creatinine Clearance: 21.1 mL/min (A) (by C-G formula based on SCr of 2.14 mg/dL (H)). ? ?Recent Results (from the past 240 hour(s))  ?Blood culture (routine x 2)     Status: None (Preliminary result)  ? Collection Time: 01/09/22 11:58 AM  ? Specimen: BLOOD  ?Result Value Ref Range Status  ? Specimen Description   Final  ?  BLOOD SITE NOT SPECIFIED ?Performed at [facility], [place] [address] [place], [place] [address] ?  ? Special Requests   Final  ?  BOTTLES DRAWN AEROBIC AND ANAEROBIC Blood Culture adequate volume ?Performed at [facility], [place] [address], [place], [place] [address] ?  ? Culture   Final  ?  NO GROWTH 4 DAYS ?Performed at [facility], [place] [address], [place], [place] [address] ?  ? Report Status PENDING  Incomplete  ?Blood culture (routine x 2)     Status: None (Preliminary result)  ? Collection Time: 01/09/22 11:58 AM  ? Specimen: BLOOD  ?Result Value Ref Range Status  ? Specimen Description   Final  ?  BLOOD SITE NOT SPECIFIED ?Performed at [facility], [place] [address], [place], [place] [address] ?  ? Special Requests   Final  ?  BOTTLES DRAWN AEROBIC AND ANAEROBIC Blood Culture results may not be optimal due to an inadequate volume of blood received in culture bottles ?Performed at [facility], [place] [address], [place], [place] [address] ?  ? Culture   Final  ?  NO GROWTH 4 DAYS ?Performed at [facility], [place] [address], [place], [place] [address] ?  ? Report Status  PENDING  Incomplete  ?Urine Culture     Status: Abnormal  ? Collection Time: 01/09/22 12:22 PM  ? Specimen: Urine, Clean Catch  ?Result Value Ref Range Status  ? Specimen Description   Final  ?  URINE, CLEAN CATCH ?Performed at [facility], [place] [address], [place], [place] [address] ?  ? Special Requests   Final  ?  NONE ?Performed at [facility], [place] [address], [place], [place] [address] ?  ? Culture MULTIPLE SPECIES PRESENT, SUGGEST RECOLLECTION (A)  Final  ? Report Status 01/10/2022 FINAL  Final  ?MRSA Next Gen by PCR, Nasal     Status: None  ? Collection Time: 01/09/22  6:35 PM  ? Specimen: Nasal Mucosa; Nasal Swab  ?Result Value Ref Range Status  ? MRSA by PCR Next Gen NOT DETECTED NOT DETECTED Final  ?  Comment: (NOTE) ?The GeneXpert MRSA Assay (FDA approved for NASAL specimens only), ?is one component of a comprehensive MRSA colonization surveillance ?program. It is not intended to diagnose MRSA infection nor to guide ?or monitor treatment for MRSA infections. ?  Test performance is not FDA approved in patients less than 2 years ?old. ?Performed at [facility], [place] [address] ?[place], [place] [address] ?  ?  ? ? ?Radiology Studies: ?No results found. ? ? ? LOS: 4 days  ? ? ?Cordelia Poche, MD ?[facility] ?01/13/2022, 1:43 PM ? ? ?If 7PM-7AM, please contact night-coverage ?[email or web address] ? ?
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
## 2022-01-14 DIAGNOSIS — I10 Essential (primary) hypertension: Secondary | ICD-10-CM | POA: Diagnosis not present

## 2022-01-14 DIAGNOSIS — F419 Anxiety disorder, unspecified: Secondary | ICD-10-CM | POA: Diagnosis not present

## 2022-01-14 DIAGNOSIS — N184 Chronic kidney disease, stage 4 (severe): Secondary | ICD-10-CM | POA: Diagnosis not present

## 2022-01-14 DIAGNOSIS — E162 Hypoglycemia, unspecified: Secondary | ICD-10-CM | POA: Diagnosis not present

## 2022-01-14 LAB — CULTURE, BLOOD (ROUTINE X 2)
Culture: NO GROWTH
Culture: NO GROWTH
Special Requests: ADEQUATE

## 2022-01-14 LAB — GLUCOSE, CAPILLARY
Glucose-Capillary: 148 mg/dL — ABNORMAL HIGH (ref 70–99)
Glucose-Capillary: 129 mg/dL — ABNORMAL HIGH (ref 70–99)
Glucose-Capillary: 132 mg/dL — ABNORMAL HIGH (ref 70–99)
Glucose-Capillary: 134 mg/dL — ABNORMAL HIGH (ref 70–99)
Glucose-Capillary: 143 mg/dL — ABNORMAL HIGH (ref 70–99)
Glucose-Capillary: 135 mg/dL — ABNORMAL HIGH (ref 70–99)

## 2022-01-14 NOTE — Progress Notes (Signed)
? ?PROGRESS NOTE ? ? ? ?AIYANNAH Hahn  RWE:[id number] DOB: 09/10/1951 DOA: 01/09/2022 ?PCP: [name], MD ? ? ?Brief Narrative: ?[name] is a 71 y.o. female with a history of GERD, anxiety, hyperlipidemia, hypertension, diabetes mellitus, CKD stage 4, recent pulmonary embolism. Patient presented secondary to altered mental status/confusion/syncope and found to have severe hypoglycemia. D10 IV fluids initiated with improvement of symptoms. Does not seem quite at baseline at this time. EEG ordered and pending for possible seizure concern. Patient with recurrent hypoglycemia which has now resolved. ? ? ?Assessment and Plan: ?* Hypoglycemia-resolved as of 01/13/2022 ?Likely secondary to insulin therapy. Hemoglobin A1C of 6.3%. Patient started on D10 IV infusion with improvement of blood sugar. Patient managed off of D10 IV fluids, but developed recurrent hypoglycemia. D5 IV fluids initiated on 3/17 and eventually weaned off again. Cortisol low at 4.6 on 3/17 and improved to 10.4 on 3/18. Patient now without recurrent hypoglycemia. ? ?CKD (chronic kidney disease) stage 4, GFR 15-29 ml/min (HCC) ?Previously on hemodialysis, however now HD catheter removed and HD discontinued. Patient follows with nephrology as an outpatient. Creatinine currently stable. ? ?Type 2 diabetes mellitus with other specified complication ([place]) ?Hemoglobin A1C of 6.3%. Patient is on Levemir 11 units at night. Levemir held on admission secondary to hypoglycemia.  ?-Discontinue insulin on discharge. ?-Carb modified diet ? ?Syncope ?Appears to be secondary to hypoglycemic episode. EEG obtained for possible seizure activity. MRI brain unremarkable. EEG without evidence of seizure activity. PT/OT recommending SNF. TOC consulted on 3/17 ? ?Essential hypertension ?-Continue home amlodipine ? ?Pure hypercholesterolemia ?-Continue Lipitor 20 mg daily ? ?Gastroesophageal reflux disease ?-Continue home Protonix ? ?Anxiety ?-Continue  Paxil ? ?Hypokalemia-resolved as of 01/12/2022 ?Potassium of 3.2 on BMP on 3/16. Kdur given and now resolved. ? ?Pressure injury of skin ?Occiput. Wound care consulted with recommendations (3/17): ?Wash wound on occiput with soap and water, pat dry. Apply mupirocin. ? ?History of pulmonary embolism ?-Continue Eliquis ? ? ? ?DVT prophylaxis: Eliquis ?Code Status:   Code Status: DNR ?Family Communication: None at bedside ?Disposition Plan: Discharge to SNF when bed is available. Medically stable for discharge on 3/18 ? ? ?Consultants:  ?None ? ?Procedures:  ?None ? ?Antimicrobials: ?None  ? ? ?Subjective: ?No issues noted overnight. ? ?Objective: ?BP 138/67 (BP Location: Right Arm)   Pulse 99   Temp 99 ?F (37.2 ?C) (Oral)   Resp 16   Ht '5\' 5"'$  (1.651 m)   Wt 60.4 kg   SpO2 100%   BMI 22.16 kg/m?  ? ?Examination: ? ?General: Well appearing, no distress ? ? ?Data Reviewed: I have personally reviewed following labs and imaging studies ? ?CBC ?Lab Results  ?Component Value Date  ? WBC 8.0 01/11/2022  ? RBC 3.09 (L) 01/11/2022  ? HGB 8.9 (L) 01/11/2022  ? HCT 27.4 (L) 01/11/2022  ? MCV 88.7 01/11/2022  ? MCH 28.8 01/11/2022  ? PLT 395 01/11/2022  ? MCHC 32.5 01/11/2022  ? RDW 14.2 01/11/2022  ? LYMPHSABS 0.8 01/09/2022  ? MONOABS 0.4 01/09/2022  ? EOSABS 0.0 01/09/2022  ? BASOSABS 0.1 01/09/2022  ? ? ? ?Last metabolic panel ?Lab Results  ?Component Value Date  ? NA 133 (L) 01/11/2022  ? K 3.6 01/11/2022  ? CL 107 01/11/2022  ? CO2 19 (L) 01/11/2022  ? BUN 22 01/11/2022  ? CREATININE 2.14 (H) 01/11/2022  ? GLUCOSE 123 (H) 01/11/2022  ? GFRNONAA 24 (L) 01/11/2022  ? GFRAA 51 (L) 04/11/2019  ? CALCIUM 9.1  01/11/2022  ? PHOS 5.3 (H) 12/27/2021  ? PROT 6.9 01/10/2022  ? ALBUMIN 3.1 (L) 01/10/2022  ? BILITOT 0.3 01/10/2022  ? ALKPHOS 62 01/10/2022  ? AST 19 01/10/2022  ? ALT 17 01/10/2022  ? ANIONGAP 7 01/11/2022  ? ? ?GFR: ?Estimated Creatinine Clearance: 21.1 mL/min (A) (by C-G formula based on SCr of 2.14 mg/dL  (H)). ? ?Recent Results (from the past 240 hour(s))  ?Blood culture (routine x 2)     Status: None  ? Collection Time: 01/09/22 11:58 AM  ? Specimen: BLOOD  ?Result Value Ref Range Status  ? Specimen Description   Final  ?  BLOOD SITE NOT SPECIFIED ?Performed at [facility], [place] [address], [place], [place] [address] ?  ? Special Requests   Final  ?  BOTTLES DRAWN AEROBIC AND ANAEROBIC Blood Culture adequate volume ?Performed at [facility], [place] [address], [place], [place] [address] ?  ? Culture   Final  ?  NO GROWTH 5 DAYS ?Performed at [facility], [place]  [address] [place], [place] [address] ?  ? Report Status 01/14/2022 FINAL  Final  ?Blood culture (routine x 2)     Status: None  ? Collection Time: 01/09/22 11:58 AM  ? Specimen: BLOOD  ?Result Value Ref Range Status  ? Specimen Description   Final  ?  BLOOD SITE NOT SPECIFIED ?Performed at [facility], [place] [address] [place], [place] [address] ?  ? Special Requests   Final  ?  BOTTLES DRAWN AEROBIC AND ANAEROBIC Blood Culture results may not be optimal due to an inadequate volume of blood received in culture bottles ?Performed at [facility], [place] [address], [place], [place] [address] ?  ? Culture   Final  ?  NO GROWTH 5 DAYS ?Performed at [facility], [place] [address] [place], [place] [address] ?  ? Report Status 01/14/2022 FINAL  Final  ?Urine Culture     Status: Abnormal  ? Collection Time: 01/09/22 12:22 PM  ? Specimen: Urine, Clean Catch  ?Result Value Ref Range Status  ? Specimen Description   Final  ?  URINE, CLEAN CATCH ?Performed at [facility], [place] [address], [place], [place] [address] ?  ? Special Requests   Final  ?  NONE ?Performed at [facility], [place] [address], [place], [place] [address] ?  ? Culture MULTIPLE SPECIES PRESENT, SUGGEST RECOLLECTION (A)  Final  ? Report Status 01/10/2022 FINAL  Final  ?MRSA Next Gen by  PCR, Nasal     Status: None  ? Collection Time: 01/09/22  6:35 PM  ? Specimen: Nasal Mucosa; Nasal Swab  ?Result Value Ref Range Status  ? MRSA by PCR Next Gen NOT DETECTED NOT DETECTED Final  ?  Comment: (NOTE) ?The GeneXpert MRSA Assay (FDA approved for NASAL specimens only), ?is one component of a comprehensive MRSA colonization surveillance ?program. It is not intended to diagnose MRSA infection nor to guide ?or monitor treatment for MRSA infections. ?Test performance is not FDA approved in patients less than 2 years ?old. ?Performed at [facility], [place] [address] ?[place],  [address] ?  ?  ? ? ?Radiology Studies: ?No results found. ? ? ? LOS: 5 days  ? ? ?Cordelia Poche, MD ?[facility] ?01/14/2022, 9:30 AM ? ? ?If 7PM-7AM, please contact night-coverage ?[email or web address] ? ?

## 2022-01-14 NOTE — Care Management Important Message (Signed)
Important Message ? ?Patient Details IM Letter placed in Patients room. ?Name: [name] ?MRN: [id number] ?Date of Birth: 12/15/1950 ? ? ?Medicare Important Message Given:  Yes ? ? ? ? ?Kerin Salen ?01/14/2022, 11:01 AM ?

## 2022-01-14 NOTE — TOC Initial Note (Signed)
Transition of Care (TOC) - Initial/Assessment Note  ? ? ?Patient Details  ?Name: [name] ?MRN: [id number] ?Date of Birth: 05-27-1951 ? ?Transition of Care (TOC) CM/SW Contact:    ?Trish Mage, LCSW ?Phone Number: ?01/14/2022, 2:41 PM ? ?Clinical Narrative:  Received message from RN that I should speak with daughter about dispositional plan.  Left 2 messages for daughter. TOC will continue to follow during the course of hospitalization. ?               ? ? ?Expected Discharge Plan:  [facility] v SNF) ?Barriers to Discharge: Continued Medical Work up ? ? ?Patient Goals and CMS Choice ?  ?  ?  ? ?Expected Discharge Plan and Services ?Expected Discharge Plan:  [facility] SNF) ?  ?  ?  ?  ?                ?  ?  ?  ?  ?  ?  ?  ?  ?  ?  ? ?Prior Living Arrangements/Services ?  ?  ?  ?       ?  ?  ?  ?  ? ?Activities of Daily Living ?Home Assistive Devices/Equipment: None ?ADL Screening (condition at time of admission) ?Patient's cognitive ability adequate to safely complete daily activities?: Yes ?Is the patient deaf or have difficulty hearing?: No ?Does the patient have difficulty seeing, even when wearing glasses/contacts?: No ?Does the patient have difficulty concentrating, remembering, or making decisions?: No ?Patient able to express need for assistance with ADLs?: Yes ?Does the patient have difficulty dressing or bathing?: Yes ?Independently performs ADLs?: No ?Communication: Independent ?Dressing (OT): Needs assistance ?Is this a change from baseline?: Change from baseline, expected to last <3days ?Grooming: Needs assistance ?Is this a change from baseline?: Change from baseline, expected to last <3 days ?Feeding: Independent ?Bathing: Needs assistance ?Is this a change from baseline?: Change from baseline, expected to last <3 days ?Toileting: Needs assistance ?Is this a change from baseline?: Change from baseline, expected to last <3 days ?In/Out Bed: Needs assistance ?Is this a change from baseline?: Change from  baseline, expected to last <3 days ?Walks in Home: Needs assistance ?Is this a change from baseline?: Change from baseline, expected to last <3 days ?Does the patient have difficulty walking or climbing stairs?: Yes ?Weakness of Legs: Both ?Weakness of Arms/Hands: Both ? ?Permission Sought/Granted ?  ?  ?   ?   ?   ?   ? ?Emotional Assessment ?  ?  ?  ?  ?  ?  ? ?Admission diagnosis:  Transient alteration of awareness [R40.4] ?Hypoglycemia [E16.2] ?Patient Active Problem List  ? Diagnosis Date Noted  ? Pressure injury of skin 01/11/2022  ? History of pulmonary embolism 01/10/2022  ? CKD (chronic kidney disease) stage 4, GFR 15-29 ml/min (HCC) 01/09/2022  ? Syncope 01/09/2022  ? Essential hypertension   ? Debility 12/04/2021  ? Anoxia of brain ([place]) 12/04/2021  ? Acute on chronic renal failure ([place]) 11/12/2021  ? Leukocytosis   ? Thrombocytopenia ([place])   ? Aspiration pneumonia of both lower lobes ([place])   ? Hemodialysis-associated hypotension   ? Acute respiratory failure (HCC) due to recurrent aspiration Pneumonia and mucous plugging/atelectasis    ? Acute saddle pulmonary embolism with acute cor pulmonale (HCC)   ? Protein-calorie malnutrition, severe 11/08/2021  ? Cardiac arrest ([place]) 10/28/2021  ? Adjustment disorder with depressed mood 04/10/2021  ? History of infectious disease 04/10/2021  ?  Affective psychosis ([place]) 11/21/2020  ? Allergic rhinitis due to pollen 11/21/2020  ? Anxiety 11/21/2020  ? Chronic kidney disease, stage 2 (mild) 11/21/2020  ? Diabetic renal disease ([place]) 11/21/2020  ? Diverticular disease of colon 11/21/2020  ? Eczema 11/21/2020  ? Gastroesophageal reflux disease 11/21/2020  ? Gout 11/21/2020  ? Joint pain 11/21/2020  ? Lymphedema 11/21/2020  ? Mild intermittent asthma 11/21/2020  ? Moderate major depression, single episode ([place]) 11/21/2020  ? Neuropathy 11/21/2020  ? Type 2 diabetes mellitus with other specified complication ([place]) 29/93/7169  ? Pure hypercholesterolemia 11/21/2020  ?  Vitamin D deficiency 11/21/2020  ? Fibromyalgia 09/04/2017  ? Insomnia 09/04/2017  ? DDD (degenerative disc disease), cervical 09/04/2017  ? DDD (degenerative disc disease), lumbar 09/04/2017  ? Primary osteoarthritis of both hands 09/04/2017  ? Primary osteoarthritis of both knees 09/04/2017  ? History of rotator cuff tear 09/04/2017  ? Osteopenia of multiple sites 09/04/2017  ? History of diabetes mellitus 09/04/2017  ? History of hypertension 09/04/2017  ? History of hypercholesterolemia 09/04/2017  ? ?PCP:  [name], MD ?Pharmacy:   ?[id number] - [place], [place] ?[place] ?[place] [address] ?Phone: [phone] Fax: [phone] ? ?[facility] ?[address] ?[place] [address] ?Phone: [phone] Fax: [phone] ? ? ? ? ?Social Determinants of Health (SDOH) Interventions ?  ? ?Readmission Risk Interventions ?Readmission Risk Prevention Plan 01/10/2022 12/04/2021 11/02/2021  ?Transportation Screening Complete Complete Complete  ?PCP or Specialist Appt within 3-5 Days Complete Not Complete -  ?Not Complete comments - INPT rehab -  ?[place] or Home Care Consult Complete Complete -  ?Social Work Consult for [place] Planning/Counseling Complete Complete -  ?Palliative Care Screening Complete Complete -  ?Medication Review Press photographer) Complete Complete -  ?Some recent data might be hidden  ? ? ? ?

## 2022-01-15 DIAGNOSIS — E1169 Type 2 diabetes mellitus with other specified complication: Secondary | ICD-10-CM

## 2022-01-15 DIAGNOSIS — E162 Hypoglycemia, unspecified: Secondary | ICD-10-CM | POA: Diagnosis not present

## 2022-01-15 DIAGNOSIS — Z794 Long term (current) use of insulin: Secondary | ICD-10-CM

## 2022-01-15 DIAGNOSIS — I10 Essential (primary) hypertension: Secondary | ICD-10-CM | POA: Diagnosis not present

## 2022-01-15 LAB — GLUCOSE, CAPILLARY
Glucose-Capillary: 123 mg/dL — ABNORMAL HIGH (ref 70–99)
Glucose-Capillary: 211 mg/dL — ABNORMAL HIGH (ref 70–99)
Glucose-Capillary: 131 mg/dL — ABNORMAL HIGH (ref 70–99)

## 2022-01-15 NOTE — TOC Progression Note (Addendum)
Transition of Care (TOC) - Progression Note  ? ? ?Patient Details  ?Name: ODALIS JORDAN ?MRN: [id number] ?Date of Birth: October 31, 1950 ? ?Transition of Care (TOC) CM/SW Contact  ?Trish Mage, LCSW ?Phone Number: ?01/15/2022, 9:08 AM ? ?Clinical Narrative:   Did not hear back from daughter yesterday.  Left another message this morning.  Initiated bed search. TOC will continue to follow during the course of hospitalization. ? ?Addendum: Spoke with daughter [name] who confirmed bed offer with [facility]. Insurance authorization request initiated. ? ? ? ? ?Expected Discharge Plan:  [facility] v SNF) ?Barriers to Discharge: Continued Medical Work up ? ?Expected Discharge Plan and Services ?Expected Discharge Plan:  [facility] v SNF) ?  ?  ?  ?  ?                ?  ?  ?  ?  ?  ?  ?  ?  ?  ?  ? ? ?Social Determinants of Health (SDOH) Interventions ?  ? ?Readmission Risk Interventions ?Readmission Risk Prevention Plan 01/10/2022 12/04/2021 11/02/2021  ?Transportation Screening Complete Complete Complete  ?PCP or Specialist Appt within 3-5 Days Complete Not Complete -  ?Not Complete comments - INPT rehab -  ?[place] or Home Care Consult Complete Complete -  ?Social Work Consult for [place] Planning/Counseling Complete Complete -  ?Palliative Care Screening Complete Complete -  ?Medication Review Press photographer) Complete Complete -  ?Some recent data might be hidden  ? ? ?

## 2022-01-15 NOTE — Progress Notes (Addendum)
? ?PROGRESS NOTE ? ? ? ?PA TENNANT  ZOX:[id number] DOB: 03-Aug-1951 DOA: 01/09/2022 ?PCP: [name], MD ? ? ?Brief Narrative: ?[name] is a 71 y.o. female with a history of GERD, anxiety, hyperlipidemia, hypertension, diabetes mellitus, CKD stage 4, recent pulmonary embolism. Patient presented secondary to altered mental status/confusion/syncope and found to have severe hypoglycemia. D10 IV fluids initiated with improvement of symptoms. Does not seem quite at baseline at this time. EEG ordered and pending for possible seizure concern. Patient with recurrent hypoglycemia which has now resolved. ? ? ?Assessment and Plan: ?* Hypoglycemia-resolved as of 01/13/2022 ?Likely secondary to insulin therapy. Hemoglobin A1C of 6.3%. Patient started on D10 IV infusion with improvement of blood sugar. Patient managed off of D10 IV fluids, but developed recurrent hypoglycemia. D5 IV fluids initiated on 3/17 and eventually weaned off again. Cortisol low at 4.6 on 3/17 and improved to 10.4 on 3/18. Patient now without recurrent hypoglycemia. ? ?CKD (chronic kidney disease) stage 4, GFR 15-29 ml/min (HCC) ?Previously on hemodialysis, however now HD catheter removed and HD discontinued. Patient follows with nephrology as an outpatient. Creatinine currently stable. ? ?Type 2 diabetes mellitus with other specified complication ([place]) ?Hemoglobin A1C of 6.3%. Patient is on Levemir 11 units at night. Levemir held on admission secondary to hypoglycemia. No fasting blood sugar available. ?-Discontinue insulin on discharge. ?-Carb modified diet ? ?Syncope ?Appears to be secondary to hypoglycemic episode. EEG obtained for possible seizure activity. MRI brain unremarkable. EEG without evidence of seizure activity. PT/OT recommending SNF. TOC consulted on 3/17 ? ?Essential hypertension ?-Continue home amlodipine ? ?Pure hypercholesterolemia ?-Continue Lipitor 20 mg daily ? ?Gastroesophageal reflux disease ?-Continue home  Protonix ? ?Anxiety ?-Continue Paxil ? ?Hypokalemia-resolved as of 01/12/2022 ?Potassium of 3.2 on BMP on 3/16. Kdur given and now resolved. ? ?Pressure injury of skin ?Occiput. Wound care consulted with recommendations (3/17): ?Wash wound on occiput with soap and water, pat dry. Apply mupirocin. ? ?History of pulmonary embolism ?-Continue Eliquis ? ? ? ?DVT prophylaxis: Eliquis ?Code Status:   Code Status: DNR ?Family Communication: None at bedside ?Disposition Plan: Discharge to SNF when bed is available. Medically stable for discharge on 3/18 ? ? ?Consultants:  ?None ? ?Procedures:  ?None ? ?Antimicrobials: ?None  ? ? ?Subjective: ?No issues noted overnight ? ?Objective: ?BP (!) 141/70 (BP Location: Left Arm)   Pulse (!) 110   Temp 98 ?F (36.7 ?C) (Oral)   Resp 16   Ht '5\' 5"'$  (1.651 m)   Wt 60 kg   SpO2 100%   BMI 22.01 kg/m?  ? ?Examination: ? ?General: Well appearing, no distress ? ? ?Data Reviewed: I have personally reviewed following labs and imaging studies ? ?CBC ?Lab Results  ?Component Value Date  ? WBC 8.0 01/11/2022  ? RBC 3.09 (L) 01/11/2022  ? HGB 8.9 (L) 01/11/2022  ? HCT 27.4 (L) 01/11/2022  ? MCV 88.7 01/11/2022  ? MCH 28.8 01/11/2022  ? PLT 395 01/11/2022  ? MCHC 32.5 01/11/2022  ? RDW 14.2 01/11/2022  ? LYMPHSABS 0.8 01/09/2022  ? MONOABS 0.4 01/09/2022  ? EOSABS 0.0 01/09/2022  ? BASOSABS 0.1 01/09/2022  ? ? ? ?Last metabolic panel ?Lab Results  ?Component Value Date  ? NA 133 (L) 01/11/2022  ? K 3.6 01/11/2022  ? CL 107 01/11/2022  ? CO2 19 (L) 01/11/2022  ? BUN 22 01/11/2022  ? CREATININE 2.14 (H) 01/11/2022  ? GLUCOSE 123 (H) 01/11/2022  ? GFRNONAA 24 (L) 01/11/2022  ? GFRAA 51 (  L) 04/11/2019  ? CALCIUM 9.1 01/11/2022  ? PHOS 5.3 (H) 12/27/2021  ? PROT 6.9 01/10/2022  ? ALBUMIN 3.1 (L) 01/10/2022  ? BILITOT 0.3 01/10/2022  ? ALKPHOS 62 01/10/2022  ? AST 19 01/10/2022  ? ALT 17 01/10/2022  ? ANIONGAP 7 01/11/2022  ? ? ?GFR: ?Estimated Creatinine Clearance: 21.1 mL/min (A) (by C-G formula  based on SCr of 2.14 mg/dL (H)). ? ?Recent Results (from the past 240 hour(s))  ?Blood culture (routine x 2)     Status: None  ? Collection Time: 01/09/22 11:58 AM  ? Specimen: BLOOD  ?Result Value Ref Range Status  ? Specimen Description   Final  ?  BLOOD SITE NOT SPECIFIED ?Performed at [place], [place] [address], [place], [place] [address] ?  ? Special Requests   Final  ?  BOTTLES DRAWN AEROBIC AND ANAEROBIC Blood Culture adequate volume ?Performed at [facility], [place] [address] [place], [place] [address] ?  ? Culture   Final  ?  NO GROWTH 5 DAYS ?Performed at [facility], [place] [address], [place], [place] [address] ?  ? Report Status 01/14/2022 FINAL  Final  ?Blood culture (routine x 2)     Status: None  ? Collection Time: 01/09/22 11:58 AM  ? Specimen: BLOOD  ?Result Value Ref Range Status  ? Specimen Description   Final  ?  BLOOD SITE NOT SPECIFIED ?Performed at [facility], [place] [address] [place], [place] [address] ?  ? Special Requests   Final  ?  BOTTLES DRAWN AEROBIC AND ANAEROBIC Blood Culture results may not be optimal due to an inadequate volume of blood received in culture bottles ?Performed at [facility], [place] [address] [place], [place] [address] ?  ? Culture   Final  ?  NO GROWTH 5 DAYS ?Performed at [facility], [place] [address], [place], [place] [address] ?  ? Report Status 01/14/2022 FINAL  Final  ?Urine Culture     Status: Abnormal  ? Collection Time: 01/09/22 12:22 PM  ? Specimen: Urine, Clean Catch  ?Result Value Ref Range Status  ? Specimen Description   Final  ?  URINE, CLEAN CATCH ?Performed at [facility], [place] [address] [place], [place] [address] ?  ? Special Requests   Final  ?  NONE ?Performed at [facility], [place] [address], [place], [place] [address] ?  ? Culture MULTIPLE SPECIES PRESENT, SUGGEST RECOLLECTION (A)  Final  ? Report Status 01/10/2022  FINAL  Final  ?MRSA Next Gen by PCR, Nasal     Status: None  ? Collection Time: 01/09/22  6:35 PM  ? Specimen: Nasal Mucosa; Nasal Swab  ?Result Value Ref Range Status  ? MRSA by PCR Next Gen NOT DETECTED NOT DETECTED Final  ?  Comment: (NOTE) ?The GeneXpert MRSA Assay (FDA approved for NASAL specimens only), ?is one component of a comprehensive MRSA colonization surveillance ?program. It is not intended to diagnose MRSA infection nor to guide ?or monitor treatment for MRSA infections. ?Test performance is not FDA approved in patients less than 2 years ?old. ?Performed at [facility], [place] [address] ?[place], [place] [address] ?  ?  ? ? ?Radiology Studies: ?No results found. ? ? ? LOS: 6 days  ? ? ?Cordelia Poche, MD ?[facility] ?01/15/2022, 12:11 PM ? ? ?If 7PM-7AM, please contact night-coverage ?[email or web address] ? ?
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
## 2022-01-16 DIAGNOSIS — R41841 Cognitive communication deficit: Secondary | ICD-10-CM | POA: Diagnosis not present

## 2022-01-16 DIAGNOSIS — R4182 Altered mental status, unspecified: Secondary | ICD-10-CM | POA: Diagnosis not present

## 2022-01-16 DIAGNOSIS — R627 Adult failure to thrive: Secondary | ICD-10-CM | POA: Diagnosis not present

## 2022-01-16 DIAGNOSIS — R2681 Unsteadiness on feet: Secondary | ICD-10-CM | POA: Diagnosis not present

## 2022-01-16 DIAGNOSIS — F5101 Primary insomnia: Secondary | ICD-10-CM | POA: Diagnosis not present

## 2022-01-16 DIAGNOSIS — F4323 Adjustment disorder with mixed anxiety and depressed mood: Secondary | ICD-10-CM | POA: Diagnosis not present

## 2022-01-16 DIAGNOSIS — Z7401 Bed confinement status: Secondary | ICD-10-CM | POA: Diagnosis not present

## 2022-01-16 DIAGNOSIS — K219 Gastro-esophageal reflux disease without esophagitis: Secondary | ICD-10-CM | POA: Diagnosis not present

## 2022-01-16 DIAGNOSIS — R7881 Bacteremia: Secondary | ICD-10-CM | POA: Diagnosis not present

## 2022-01-16 DIAGNOSIS — E7800 Pure hypercholesterolemia, unspecified: Secondary | ICD-10-CM | POA: Diagnosis not present

## 2022-01-16 DIAGNOSIS — M797 Fibromyalgia: Secondary | ICD-10-CM | POA: Diagnosis not present

## 2022-01-16 DIAGNOSIS — R410 Disorientation, unspecified: Secondary | ICD-10-CM | POA: Diagnosis not present

## 2022-01-16 DIAGNOSIS — E1169 Type 2 diabetes mellitus with other specified complication: Secondary | ICD-10-CM | POA: Diagnosis not present

## 2022-01-16 DIAGNOSIS — I10 Essential (primary) hypertension: Secondary | ICD-10-CM | POA: Diagnosis not present

## 2022-01-16 DIAGNOSIS — K589 Irritable bowel syndrome without diarrhea: Secondary | ICD-10-CM | POA: Diagnosis not present

## 2022-01-16 DIAGNOSIS — B351 Tinea unguium: Secondary | ICD-10-CM | POA: Diagnosis not present

## 2022-01-16 DIAGNOSIS — F321 Major depressive disorder, single episode, moderate: Secondary | ICD-10-CM | POA: Diagnosis not present

## 2022-01-16 DIAGNOSIS — R000 Tachycardia, unspecified: Secondary | ICD-10-CM | POA: Diagnosis not present

## 2022-01-16 DIAGNOSIS — N184 Chronic kidney disease, stage 4 (severe): Secondary | ICD-10-CM | POA: Diagnosis not present

## 2022-01-16 DIAGNOSIS — Z741 Need for assistance with personal care: Secondary | ICD-10-CM | POA: Diagnosis not present

## 2022-01-16 DIAGNOSIS — M6281 Muscle weakness (generalized): Secondary | ICD-10-CM | POA: Diagnosis not present

## 2022-01-16 DIAGNOSIS — G931 Anoxic brain damage, not elsewhere classified: Secondary | ICD-10-CM | POA: Diagnosis not present

## 2022-01-16 DIAGNOSIS — E162 Hypoglycemia, unspecified: Secondary | ICD-10-CM | POA: Diagnosis not present

## 2022-01-16 DIAGNOSIS — M79675 Pain in left toe(s): Secondary | ICD-10-CM | POA: Diagnosis not present

## 2022-01-16 DIAGNOSIS — B952 Enterococcus as the cause of diseases classified elsewhere: Secondary | ICD-10-CM | POA: Diagnosis not present

## 2022-01-16 DIAGNOSIS — E161 Other hypoglycemia: Secondary | ICD-10-CM | POA: Diagnosis not present

## 2022-01-16 DIAGNOSIS — M79674 Pain in right toe(s): Secondary | ICD-10-CM | POA: Diagnosis not present

## 2022-01-16 DIAGNOSIS — Z5181 Encounter for therapeutic drug level monitoring: Secondary | ICD-10-CM | POA: Diagnosis not present

## 2022-01-16 DIAGNOSIS — F419 Anxiety disorder, unspecified: Secondary | ICD-10-CM | POA: Diagnosis not present

## 2022-01-16 DIAGNOSIS — E1122 Type 2 diabetes mellitus with diabetic chronic kidney disease: Secondary | ICD-10-CM | POA: Diagnosis not present

## 2022-01-16 DIAGNOSIS — I2782 Chronic pulmonary embolism: Secondary | ICD-10-CM | POA: Diagnosis not present

## 2022-01-16 DIAGNOSIS — M6259 Muscle wasting and atrophy, not elsewhere classified, multiple sites: Secondary | ICD-10-CM | POA: Diagnosis not present

## 2022-01-16 LAB — GLUCOSE, CAPILLARY
Glucose-Capillary: 150 mg/dL — ABNORMAL HIGH (ref 70–99)
Glucose-Capillary: 181 mg/dL — ABNORMAL HIGH (ref 70–99)

## 2022-01-16 MED ORDER — TEMAZEPAM 15 MG PO CAPS: 15.0000 mg | ORAL_CAPSULE | Freq: Every evening | ORAL | 0 refills | Status: DC | PRN

## 2022-01-16 NOTE — Progress Notes (Signed)
Report given to [place], LPN at [facility]. ?

## 2022-01-16 NOTE — Discharge Summary (Signed)
?Physician Discharge Summary ?  ?Patient: [name] MRN: [id number] DOB: Aug 30, 1951  ?Admit date:     01/09/2022  ?Discharge date: 01/16/22  ?Discharge Physician: Berle Mull  ?PCP: [name], MD ? ?Recommendations at discharge: ? Follow up with PCP in 1 week ? ?Discharge Diagnoses: ?Principal Problem: ?  Hypoglycemia ?Active Problems: ?  Type 2 diabetes mellitus with other specified complication ([place]) ?  CKD (chronic kidney disease) stage 4, GFR 15-29 ml/min (HCC) ?  Anxiety ?  Gastroesophageal reflux disease ?  Pure hypercholesterolemia ?  Essential hypertension ?  Syncope ?  History of pulmonary embolism ?  Pressure injury of skin ? ? ?Hospital Course: ?[name] is a 71 y.o. female with a history of GERD, anxiety, hyperlipidemia, hypertension, diabetes mellitus, CKD stage 4, recent pulmonary embolism. Patient presented secondary to altered mental status/confusion/syncope and found to have severe hypoglycemia. D10 IV fluids initiated with improvement of symptoms. Does not seem quite at baseline at this time. EEG ordered and pending for possible seizure concern. Patient with recurrent hypoglycemia which has now resolved. ? ?Assessment and Plan: ?* Hypoglycemia ?Likely secondary to insulin therapy.  ?Hemoglobin A1C of 6.3%.  ?Patient started on D10 IV infusion with improvement of blood sugar. Patient managed off of D10 IV fluids, but developed recurrent hypoglycemia.  ?D5 IV fluids initiated on 3/17 and eventually weaned off again.  ?Cortisol low at 4.6 on 3/17 and improved to 10.4 on 3/18.  ?Patient now without recurrent hypoglycemia. ? ?CKD (chronic kidney disease) stage 4, GFR 15-29 ml/min (HCC) ?Previously on hemodialysis, however now HD catheter removed and HD discontinued. Patient follows with nephrology as an outpatient. Creatinine currently stable. ? ?Type 2 diabetes mellitus with other specified complication ([place]) ?Hemoglobin A1C of 6.3%. Patient is on Levemir 11 units at night. Levemir  held on admission secondary to hypoglycemia. No fasting blood sugar available. ?-Discontinue insulin on discharge. ?-Carb modified diet ? ?Syncope ?Appears to be secondary to hypoglycemic episode. EEG obtained for possible seizure activity. MRI brain unremarkable. EEG without evidence of seizure activity. PT/OT recommending SNF. TOC consulted on 3/17 ? ?Essential hypertension ?-Continue home amlodipine ? ?Pure hypercholesterolemia ?-Continue Lipitor 20 mg daily ? ?Gastroesophageal reflux disease ?-Continue home Protonix ? ?Anxiety ?-Continue Paxil ? ?Hypokalemia-resolved as of 01/12/2022 ?Potassium of 3.2 on BMP on 3/16. Kdur given and now resolved. ? ?Pressure injury of skin ?Occiput. Wound care consulted with recommendations (3/17): ?Wash wound on occiput with soap and water, pat dry. Apply mupirocin. ? ?History of pulmonary embolism ?-Continue Eliquis ? ?Insomnia  ?On xanax and temazepam at the same time,  ?Will stop xanax. Continue temazepam.  ? ?Consultants: none ?Procedures performed:  ?none ?DISCHARGE MEDICATION: ?Allergies as of 01/16/2022   ? ?   Reactions  ? Aspirin Nausea And Vomiting  ? Other reaction(s): Unknown  ? Benadryl [diphenhydramine]   ? Can only take dye free. States the dye causes itching.  ? Darvon [propoxyphene] Itching  ? Can only during the day. Night time makes her itch  ? Hydrocodone Bit-homatrop Mbr Itching  ? Pre medicate  ? Metformin   ? Other reaction(s): GI  ? Other   ? Other reaction(s): Unknown. States reaction was to nasal spray that caused pupils to shrink  ? Pentazocine   ? nervous ?Other reaction(s): jitters  ? Percocet [oxycodone-acetaminophen]   ? Itching; pre medicate  ? Sulfa Antibiotics Hives  ? Tetracaine Hcl   ? Other reaction(s): Unknown. Thinks caused itching.  ? Tetracyclines & Related Hives  ?  Tramadol   ? Other reaction(s): itch  ? ?  ? ?  ?Medication List  ?  ? ?STOP taking these medications   ? ?ALPRAZolam 0.25 MG tablet ?Commonly known as: Duanne Moron ?  ?Levemir  FlexPen 100 UNIT/ML FlexPen ?Generic drug: insulin detemir ?  ?oxyCODONE 5 MG immediate release tablet ?Commonly known as: Oxy IR/ROXICODONE ?  ? ?  ? ?TAKE these medications   ? ?Accu-Chek Guide test strip ?Generic drug: glucose blood ?Use up to four times daily as directed ?  ?Accu-Chek Guide w/Device Kit ?Use up to four times daily as directed ?  ?Accu-Chek Softclix Lancets lancets ?Use up to four times daily as directed ?  ?acetaminophen 325 MG tablet ?Commonly known as: TYLENOL ?Take 2 tablets (650 mg total) by mouth every 4 (four) hours as needed for fever (greater than 37.6 C). ?  ?albuterol 108 (90 Base) MCG/ACT inhaler ?Commonly known as: VENTOLIN HFA ?Inhale 1-2 puffs into the lungs every 4 (four) hours as needed for wheezing or shortness of breath. ?  ?amLODipine 10 MG tablet ?Commonly known as: NORVASC ?Take 1 tablet (10 mg total) by mouth daily at 8 pm. ?  ?atorvastatin 20 MG tablet ?Commonly known as: LIPITOR ?Take 1 tablet (20 mg total) by mouth daily. ?  ?B-complex with vitamin C tablet ?Take 1 tablet by mouth daily. ?  ?BD Pen Needle Nano U/F 32G X 4 MM Misc ?Generic drug: Insulin Pen Needle ?Use twice daily with Levemir Flexpen ?  ?benzocaine 10 % mucosal gel ?Commonly known as: ORAJEL ?Use as directed in the mouth or throat with breakfast, with lunch, and with evening meal. ?  ?blood glucose meter kit and supplies ?Dispense based on patient and insurance preference. Use up to four times daily as directed. (FOR ICD-10 E10.9, E11.9). ?  ?dicyclomine 10 MG capsule ?Commonly known as: BENTYL ?Take 1 capsule (10 mg total) by mouth 3 (three) times daily before meals. ?  ?Eliquis 5 MG Tabs tablet ?Generic drug: apixaban ?Take 1 tablet (5 mg total) by mouth 2 (two) times daily. ?  ?folic acid 1 MG tablet ?Commonly known as: FOLVITE ?Take 2 tablets (2 mg total) by mouth daily. ?  ?lidocaine 5 % ?Commonly known as: LIDODERM ?Place 2 patches onto the skin daily. Remove & Discard patch within 12 hours or as  directed by MD ?  ?pantoprazole 40 MG tablet ?Commonly known as: PROTONIX ?Take 1 tablet (40 mg total) by mouth 2 (two) times daily. ?  ?PARoxetine 40 MG tablet ?Commonly known as: PAXIL ?Take 1 tablet (40 mg total) by mouth at bedtime. ?  ?temazepam 15 MG capsule ?Commonly known as: RESTORIL ?Take 1 capsule (15 mg total) by mouth at bedtime as needed for sleep. ?  ?Vitamin D (Ergocalciferol) 1.25 MG (50000 UNIT) Caps capsule ?Commonly known as: DRISDOL ?Take 1 capsule (50,000 Units total) by mouth once a week. ?  ? ?  ? ?  ?  ? ? ?  ?Discharge Care Instructions  ?(From admission, onward)  ?  ? ? ?  ? ?  Start     Ordered  ? 01/16/22 0000  Discharge wound care:       ?Comments: Wash wound on occiput with soap and water, pat dry. Apply mupirocin.  ? 01/16/22 0941  ? ?  ?  ? ?  ? ? Contact information for follow-up providers   ? ? [name], MD. Schedule an appointment as soon as possible for a visit in 1 week(s).   ?  Specialty: Family Medicine ?Contact information: ?[address] ?[place] [address] ?[phone] ? ? ?  ?  ? ?  ?  ? ? Contact information for after-discharge care   ? ? Destination   ? ? [facility] Preferred SNF .   ?Service: Skilled Nursing ?Contact information: ?[address] ?[place] ?[phone] ? ?  ?  ? ?  ?  ? ?  ?  ? ?  ? ?Disposition: Skilled nursing facility ?Diet recommendation:  ?Discharge Diet Orders (From admission, onward)  ? ?  Start     Ordered  ? 01/16/22 0000  Diet - low sodium heart healthy       ? 01/16/22 0941  ? ?  ?  ? ?  ? ?Discharge Exam: ?Filed Weights  ? 01/13/22 0419 01/14/22 0412 01/15/22 0500  ?Weight: 63.5 kg 60.4 kg 60 kg  ? ?General: Appear in mild distress; no visible Abnormal Neck Mass Or lumps, Conjunctiva normal ?Cardiovascular: S1 and S2 Present, no Murmur, ?Respiratory: good respiratory effort, Bilateral Air entry present and CTA, no Crackles, no wheezes ?Abdomen: Bowel Sound present  nontender ?Extremities: no Pedal edema ?Neurology: alert and oriented to place and person ?Gait not checked due to patient safety concerns  ? ?Condition at discharge: good ? ?The results of significant diagnostic

## 2022-01-16 NOTE — TOC Transition Note (Addendum)
Transition of Care (TOC) - CM/SW Discharge Note ? ? ?Patient Details  ?Name: [name] ?MRN: [id number] ?Date of Birth: 1951/08/21 ? ?Transition of Care (TOC) CM/SW Contact:  ?Trish Mage, LCSW ?Phone Number: ?01/16/2022, 10:15 AM ? ? ?Clinical Narrative:   Patient who is stable for d/c will transfer to [facility] today. Family alerted.  PTAR arranged.  Nursing, please call report to [phone], room 312. TOC sign off. ? ? ? ?Final next level of care: [place] ?Barriers to Discharge: Barriers Resolved ? ? ?Patient Goals and CMS Choice ?  ?  ?  ? ?Discharge Placement ?  ?           ?  ?  ?  ?  ? ?Discharge Plan and Services ?  ?  ?           ?  ?  ?  ?  ?  ?  ?  ?  ?  ?  ? ?Social Determinants of Health (SDOH) Interventions ?  ? ? ?Readmission Risk Interventions ? ?  01/10/2022  ?  9:45 AM 12/04/2021  ?  4:26 PM 11/02/2021  ?  2:45 PM  ?Readmission Risk Prevention Plan  ?Transportation Screening Complete Complete Complete  ?PCP or Specialist Appt within 3-5 Days Complete Not Complete   ?Not Complete comments  INPT rehab   ?[place] or Home Care Consult Complete Complete   ?Social Work Consult for [place] Planning/Counseling Complete Complete   ?Palliative Care Screening Complete Complete   ?Medication Review Press photographer) Complete Complete   ? ? ? ? ? ?

## 2022-01-16 NOTE — Progress Notes (Signed)
Discharged to [facility] via [facility]  Daughter notified Patient confused, irritable during discharge process.  Reassured pt of discharge plan  ?

## 2022-01-17 ENCOUNTER — Non-Acute Institutional Stay (SKILLED_NURSING_FACILITY): Payer: Self-pay | Admitting: Internal Medicine

## 2022-01-17 ENCOUNTER — Encounter: Payer: Self-pay | Admitting: Internal Medicine

## 2022-01-17 DIAGNOSIS — Z862 Personal history of diseases of the blood and blood-forming organs and certain disorders involving the immune mechanism: Secondary | ICD-10-CM

## 2022-01-17 DIAGNOSIS — E162 Hypoglycemia, unspecified: Secondary | ICD-10-CM

## 2022-01-17 DIAGNOSIS — N184 Chronic kidney disease, stage 4 (severe): Secondary | ICD-10-CM

## 2022-01-17 DIAGNOSIS — E43 Unspecified severe protein-calorie malnutrition: Secondary | ICD-10-CM

## 2022-01-17 DIAGNOSIS — N189 Chronic kidney disease, unspecified: Secondary | ICD-10-CM

## 2022-01-17 DIAGNOSIS — R29818 Other symptoms and signs involving the nervous system: Secondary | ICD-10-CM

## 2022-01-17 DIAGNOSIS — R4189 Other symptoms and signs involving cognitive functions and awareness: Secondary | ICD-10-CM

## 2022-01-17 NOTE — Assessment & Plan Note (Signed)
CKD stage IV present with GFR in the low 20s.  H/H varied from 8.1-8.9/25.7-27.4 with normochromic, normocytic indices.  No bleeding dyscrasias reported. ?

## 2022-01-17 NOTE — Assessment & Plan Note (Addendum)
Albumin is 3.1 and total protein 6.9; but on exam she has limb atrophy and temporal wasting.  She appears almost cachectic. ?Nutrition consult @ SNF ?

## 2022-01-17 NOTE — Progress Notes (Signed)
? ?NURSING HOME LOCATION:  [place] ?ROOM NUMBER:  [id number] ? ?CODE STATUS:  DNR ? ?PCP:  [name] MD ? ?This is a comprehensive admission note to this SNFperformed on this date less than 30 days from date of admission. ?Included are preadmission medical/surgical history; reconciled medication list; family history; social history and comprehensive review of systems.  ?Corrections and additions to the records were documented. Comprehensive physical exam was also performed. Additionally a clinical summary was entered for each active diagnosis pertinent to this admission in the Problem List to enhance continuity of care. ? ?HPI: Patient was hospitalized 3/15 - 01/16/2022 presenting with altered mental status and syncope with documented severe hypoglycemia.  D10 was initiated IV with improvement in symptoms.  The hypoglycemia was in the context of current A1c of 6.3% and insulin therapy, specifically 11 units of Levemir at night.  Cortisol was low at 4.6 but subsequent value was 10.4. ?At presentation creatinine was 2.18 with a GFR 23. ?Normochromic normocytic anemia was present with H/H varying from 8.1-8.9/25.7-27.4. ?At admission she had been on Xanax and temazepam; Xanax was discontinued. ?At discharge she was placed on a carb modified diet and the insulin was not reinitiated. ? ?Past medical and surgical history: Includes history of asthma, history of cardiac arrest, depression, fibromyalgia, GERD, dyslipidemia, essential hypertension, diabetes with vascular disease and CKD, and recent history of PTE.[place] Kitchen ?Surgical procedures include colonoscopy, knee arthroscopy x2, shoulder arthroscopy, and multiple vascular procedures. ? ?Social history: Nondrinker; former smoker with 10-pack-year history. ? ?Family history: Limited history reviewed ?  ?Review of systems: Clinical neurocognitive deficits made validity of responses questionable , preventing ROS completion. Date given as "April 23".  When  I asked why she had been in the hospital she began to tell me about being hospitalized in December 22 with "heart attack and strokes and problems with diabetes".  When I asked her about the hospitalization 3/15 - 3/22 she responded "never did get out of the hospital".  She cannot name her primary care physician.  When asked if she were having any current symptoms her response was "I do not know".  She then began to confabulate about her mother being over at her sister's house.  The only focused complaint she gave was that her right foot hurt.  She did agree that she had kidney problems but could not tell me the nature of the process. ? ?Physical exam:  ?Pertinent or positive findings: When I entered the room she was asleep.  She did not exhibit hypopnea or apnea.  She appeared chronically ill and cachectic with temporal wasting and atrophic limbs.  Eyebrows were decreased in density.  Upon awakening facies were blank.  Responses were slow and her voice was low.  She is not wearing the upper plate; lower plate is present.  She exhibits distant heart sounds with slight tachycardia.  Breath sounds are markedly decreased.  Posterior tibial pulses are stronger than dorsalis pedis pulses.  She has insignificant trace edema at the sock line. ? ?General appearance: no acute distress, increased work of breathing is present.   ?Lymphatic: No lymphadenopathy about the head, neck, axilla. ?Eyes: No conjunctival inflammation or lid edema is present. There is no scleral icterus. ?Ears:  External ear exam shows no significant lesions or deformities.   ?Nose:  External nasal examination shows no deformity or inflammation. Nasal mucosa are pink and moist without lesions, exudates ?Neck:  No thyromegaly, masses, tenderness noted.    ?Heart:  No  murmur, click, rub.  ?Lungs:  without wheezes, rhonchi, rales, rubs. ?Abdomen: Bowel sounds are normal.  Abdomen is soft and nontender with no organomegaly, hernias, masses. ?GU: Deferred   ?Extremities:  No cyanosis, clubbing ?Neurologic exam:  Balance, [place], finger to nose testing could not be completed due to clinical state ?Skin: Warm & dry w/o tenting. ?No significant lesions or rash. ? ?See clinical summary under each active problem in the Problem List with associated updated therapeutic plan ? ?

## 2022-01-17 NOTE — Assessment & Plan Note (Addendum)
See 01/17/2022 she could give no meaningful history as to her recent hospitalization.  MMSE indicated if there is not serial improvement.  Insulin has been discontinued. ?

## 2022-01-17 NOTE — Patient Instructions (Signed)
See assessment and plan under each diagnosis in the problem list and acutely for this visit 

## 2022-01-17 NOTE — Assessment & Plan Note (Signed)
01/09/2022 creatinine 2.18 with a GFR of 23 indicating CKD stage IV.  Patient no longer on hemodialysis. ?

## 2022-01-19 LAB — GLUCOSE, CAPILLARY: Glucose-Capillary: 129 mg/dL — ABNORMAL HIGH (ref 70–99)

## 2022-01-22 ENCOUNTER — Non-Acute Institutional Stay (SKILLED_NURSING_FACILITY): Payer: Medicare PPO | Admitting: Internal Medicine

## 2022-01-22 ENCOUNTER — Encounter: Payer: Self-pay | Admitting: Internal Medicine

## 2022-01-22 DIAGNOSIS — R627 Adult failure to thrive: Secondary | ICD-10-CM

## 2022-01-22 DIAGNOSIS — F321 Major depressive disorder, single episode, moderate: Secondary | ICD-10-CM

## 2022-01-22 DIAGNOSIS — R000 Tachycardia, unspecified: Secondary | ICD-10-CM | POA: Insufficient documentation

## 2022-01-22 DIAGNOSIS — M797 Fibromyalgia: Secondary | ICD-10-CM | POA: Diagnosis not present

## 2022-01-22 LAB — CBC AND DIFFERENTIAL
HCT: 32 — AB (ref 36–46)
Hemoglobin: 10.6 — AB (ref 12.0–16.0)
Platelets: 370 10*3/uL (ref 150–400)
WBC: 7.6

## 2022-01-22 LAB — COMPREHENSIVE METABOLIC PANEL: Calcium: 10.3 (ref 8.7–10.7)

## 2022-01-22 LAB — BASIC METABOLIC PANEL
BUN: 24 — AB (ref 4–21)
CO2: 16 (ref 13–22)
Chloride: 104 (ref 99–108)
Creatinine: 2.2 — AB (ref 0.5–1.1)
Glucose: 170
Potassium: 4.1 mEq/L (ref 3.5–5.1)
Sodium: 138 (ref 137–147)

## 2022-01-22 LAB — CBC: RBC: 3.71 — AB (ref 3.87–5.11)

## 2022-01-22 NOTE — Progress Notes (Signed)
? ?NURSING HOME LOCATION:  [facility] ?ROOM NUMBER:  [id number] ? ?CODE STATUS:  DNR ? ?PCP:  [name] MD ? ?This is a nursing facility follow up visit for concerns expressed by Speech Therapy & PT/OT.  They state that she is more alert but exhibits tachycardia with mobilization.  Additionally oral intake has been decreased.  She remains somnolent but family is requesting Xanax prior to her PT/ OT sessions. ? ?Interim medical record and care since last SNF visit was updated with review of diagnostic studies and change in clinical status since last visit were documented. ? ?HPI: The patient's  main complaint is "pain, pain".  She describes sharp pain in the right shoulder area which she has had "off and on for a year" which last minutes and does not radiate.  She states this has been worse in the last 3 days.  She also has had right ankle and right calf pain "off and on for 6 years".  This is described as local and sharp and intermittently occurring up to half a day at a time. ?Significantly she does have a diagnosis of fibromyalgia.  On 3/23 sed rate was 79 but C-reactive protein was 0.8. ? ?Review of systems: Again she cannot tell me why she was in the hospital 3/15 - 01/16/2022.  When I gave her the time span 3/15 - 3/22; she asked "what year".  She states that she is "simply trying to live".  She describes a 20 pound weight loss in the last 6 months.  This is in the context of anorexia but she denies dysphagia, dyspepsia, melena, rectal bleeding. ? ?Constitutional: No fever  ?Eyes: No redness, discharge, pain, vision change ?ENT/mouth: No nasal congestion,  purulent discharge, earache, change in hearing, sore throat  ?Cardiovascular: No chest pain, palpitations, paroxysmal nocturnal dyspnea, claudication, edema  ?Respiratory: No cough, sputum production, hemoptysis, DOE, significant snoring, apnea   ?Gastrointestinal: No heartburn, dysphagia, abdominal pain, nausea /vomiting, rectal  bleeding, melena, change in bowels ?Genitourinary: No dysuria, hematuria, pyuria, incontinence, nocturia ?Dermatologic: No rash, pruritus, change in appearance of skin ?Neurologic: No dizziness, headache, syncope, seizures, numbness, tingling ?Psychiatric: No significant anxiety, depression, insomnia ?Endocrine: No change in hair/skin/nails, excessive thirst, excessive hunger, excessive urination  ?Hematologic/lymphatic: No significant bruising, lymphadenopathy, abnormal bleeding ?Allergy/immunology: No itchy/watery eyes, significant sneezing, urticaria, angioedema ? ?Physical exam:  ?Pertinent or positive findings: She appears suboptimally nourished.  Her affect is markedly sad.  The sclerae are muddy without icterus.  She is missing multiple maxillary and mandibular teeth.  She is not wearing her partials.  Breath sounds are decreased.  Heart sounds are distant but tachycardia is noted.  Pedal pulses are decreased.  Limbs are atrophic.  She complains of pain in both shoulders with elevation.  There is crepitus to range of motion testing in the left shoulder.  The upper extremities could not be raised above the shoulder level.  There was no pain with range of motion of the right ankle but she complained of tightness in the Achilles area with extension of the right foot.  Bevelyn Buckles' sign in the right calf is equivocally positive.  She exhibits some tremulousness of the upper extremities.  Reflexes at the biceps are 1+ and 1&1/2+ at the knees. ? ?General appearance: no acute distress, increased work of breathing is present.   ?Lymphatic: No lymphadenopathy about the head, neck, axilla. ?Eyes: No conjunctival inflammation or lid edema is present.  ?Ears:  External ear exam shows no significant  lesions or deformities.   ?Nose:  External nasal examination shows no deformity or inflammation. Nasal mucosa are pink and moist without lesions, exudates ?Oral exam:  There is no oropharyngeal erythema or exudate. ?Neck:  No  thyromegaly, masses, tenderness noted.    ?Heart:  No murmur, click, rub .  ?Lungs: without wheezes, rhonchi, rales, rubs. ?Abdomen: Bowel sounds are normal. Abdomen is soft and nontender with no organomegaly, hernias, masses. ?GU: Deferred  ?Extremities:  No cyanosis, clubbing, edema  ?Neurologic exam :Balance, Rhomberg, finger to nose testing could not be completed due to clinical state ?Skin: Warm & dry w/o tenting. ?No significant lesions or rash. ? ?See summary under each active problem in the Problem List with associated updated therapeutic plan ? ? ?

## 2022-01-22 NOTE — Assessment & Plan Note (Signed)
Resting tachycardia present.  Full TFTs will be performed. ?

## 2022-01-22 NOTE — Assessment & Plan Note (Signed)
Labs will be updated to include thyroid function test. ?

## 2022-01-22 NOTE — Assessment & Plan Note (Signed)
Clinically she is profoundly depressed and has been so for months.  Cymbalta may be of benefit in treating both depression and pain syndromes. ?

## 2022-01-22 NOTE — Assessment & Plan Note (Addendum)
See 01/22/2022 Her chief complaints are pain in the right shoulder with exam suggesting shoulder impingement syndrome.  She also has pain in the right ankle and calf.  [name]' sign is equivocal, but she is already on a DOAC ?Check CK ?Voltaren gel twice daily to right shoulder and right ankle. ?

## 2022-01-23 ENCOUNTER — Non-Acute Institutional Stay (SKILLED_NURSING_FACILITY): Payer: Medicare PPO | Admitting: Adult Health

## 2022-01-23 ENCOUNTER — Encounter: Payer: Self-pay | Admitting: Adult Health

## 2022-01-23 DIAGNOSIS — R000 Tachycardia, unspecified: Secondary | ICD-10-CM | POA: Diagnosis not present

## 2022-01-23 DIAGNOSIS — M797 Fibromyalgia: Secondary | ICD-10-CM | POA: Diagnosis not present

## 2022-01-23 DIAGNOSIS — F321 Major depressive disorder, single episode, moderate: Secondary | ICD-10-CM

## 2022-01-23 LAB — BASIC METABOLIC PANEL
BUN: 23 — AB (ref 4–21)
CO2: 18 (ref 13–22)
Chloride: 103 (ref 99–108)
Creatinine: 2.5 — AB (ref 0.5–1.1)
Glucose: 138
Potassium: 4.1 mEq/L (ref 3.5–5.1)
Sodium: 139 (ref 137–147)

## 2022-01-23 LAB — CBC AND DIFFERENTIAL
HCT: 31 — AB (ref 36–46)
Hemoglobin: 10.2 — AB (ref 12.0–16.0)
Platelets: 377 10*3/uL (ref 150–400)
WBC: 7.3

## 2022-01-23 LAB — HEPATIC FUNCTION PANEL
ALT: 16 U/L (ref 7–35)
AST: 18 (ref 13–35)
Alkaline Phosphatase: 88 (ref 25–125)
Bilirubin, Total: 0.3

## 2022-01-23 LAB — CBC: RBC: 3.61 — AB (ref 3.87–5.11)

## 2022-01-23 LAB — MAGNESIUM
Free T3: 2.8
Free T4: 1.36
Magnesium: 1.9

## 2022-01-23 LAB — COMPREHENSIVE METABOLIC PANEL
Albumin: 4.2 (ref 3.5–5.0)
Calcium: 10.2 (ref 8.7–10.7)
Globulin: 3.2

## 2022-01-23 LAB — TSH: TSH: 1.26 (ref 0.41–5.90)

## 2022-01-23 NOTE — Progress Notes (Signed)
? ?Location:  [facility] ?Nursing Home Room Number: 774 J ?Place of Service:  SNF (31) ?Provider:  Durenda Age, DNP, FNP-BC ? ?Patient Care Team: ?[name], MD as PCP - General (Family Medicine) ? ?Extended Emergency Contact Information ?Primary Emergency Contact: Tata,[name] ?Address: [address] ?         [place], [place] [address] [place] ?Mobile Phone: [phone] ?Relation: Daughter ? ?Code Status:  DNR ? ?Goals of care: Advanced Directive information ? ?  01/23/2022  ?  3:08 PM  ?Advanced Directives  ?Does Patient Have a Medical Advance Directive? No  ?Would patient like information on creating a medical advance directive? No - Patient declined  ? ? ? ?Chief Complaint  ?Patient presents with  ? Acute Visit  ?  Tachycardia   ? ? ?HPI:  ?Pt is a 71 y.o. female seen today for an acute visit regarding tachycardia. She is currently having short-term rehabilitation. HRs ranging from 94 to 108. She denies chest pain. She takes amlodipine 10 mg 1 tab at bedtime for hypertension.  She was recently started on duloxetine for complaints of right shoulder pain/ fibromyalgia. She takes Paroxetine 40 mg 1 tab at bedtime for depression. Latest PHQ-9 score is 0, no depressed mood. ? ? ?Past Medical History:  ?Diagnosis Date  ? Anxiety   ? Arthritis   ? Asthma   ? Cardiac arrest ([place]) 10/28/2021  ? Depression   ? Diabetes mellitus without complication ([place])   ? Fibromyalgia   ? GERD (gastroesophageal reflux disease)   ? Hyperlipemia   ? Hypertension   ? PONV (postoperative nausea and vomiting)   ? ?Past Surgical History:  ?Procedure Laterality Date  ? COLONOSCOPY    ? DILATION AND CURETTAGE OF UTERUS    ? IR EMBO ART  VEN HEMORR LYMPH EXTRAV  INC GUIDE ROADMAPPING  11/01/2021  ? IR FLUORO GUIDE CV LINE RIGHT  11/20/2021  ? IR REMOVAL TUN CV CATH W/O FL  12/20/2021  ? IR THROMBECT PRIM MECH INIT (INCLU) MOD SED  10/29/2021  ? IR US GUIDE VASC ACCESS RIGHT  10/29/2021  ? IR US GUIDE VASC  ACCESS RIGHT  11/01/2021  ? IR US GUIDE VASC ACCESS RIGHT  11/21/2021  ? KNEE ARTHROSCOPY  [id number]  ? left  ? SHOULDER ARTHROSCOPY    ? left  ? TRIGGER FINGER RELEASE  08/13/2012  ? Procedure: RELEASE TRIGGER FINGER/A-1 PULLEY;  Surgeon: [name]., MD;  Location: [facility];  Service: Orthopedics;  Laterality: Right;  long finger  ? TUBAL LIGATION    ? ? ?Allergies  ?Allergen Reactions  ? Aspirin Nausea And Vomiting  ?  Other reaction(s): Unknown  ? Benadryl [Diphenhydramine]   ?  Can only take dye free. States the dye causes itching.  ? Darvon [Propoxyphene] Itching  ?  Can only during the day. Night time makes her itch  ? Hydrocodone Bit-Homatrop Mbr Itching  ?  Pre medicate  ? Metformin   ?  Other reaction(s): GI  ? Other   ?  Other reaction(s): Unknown. States reaction was to nasal spray that caused pupils to shrink  ? Pentazocine   ?  nervous ?Other reaction(s): jitters  ? Percocet [Oxycodone-Acetaminophen]   ?  Itching; pre medicate  ? Sulfa Antibiotics Hives  ? Tetracaine Hcl   ?  Other reaction(s): Unknown. Thinks caused itching.  ? Tetracyclines & Related Hives  ? Tramadol   ?  Other reaction(s): itch  ? ? ?  Outpatient Encounter Medications as of 01/23/2022  ?Medication Sig  ? Accu-Chek Softclix Lancets lancets Use up to four times daily as directed  ? acetaminophen (TYLENOL) 325 MG tablet Take 2 tablets (650 mg total) by mouth every 4 (four) hours as needed for fever (greater than 37.6 C).  ? albuterol (VENTOLIN HFA) 108 (90 Base) MCG/ACT inhaler Inhale 1-2 puffs into the lungs every 4 (four) hours as needed for wheezing or shortness of breath.  ? amLODipine (NORVASC) 10 MG tablet Take 1 tablet (10 mg total) by mouth daily at 8 pm.  ? apixaban (ELIQUIS) 5 MG TABS tablet Take 1 tablet (5 mg total) by mouth 2 (two) times daily.  ? atorvastatin (LIPITOR) 20 MG tablet Take 1 tablet (20 mg total) by mouth daily.  ? B Complex-C (B-COMPLEX WITH VITAMIN C) tablet Take 1 tablet by mouth daily.   ? bisacodyl (DULCOLAX) 10 MG suppository Place 10 mg rectally as needed for moderate constipation.  ? blood glucose meter kit and supplies Dispense based on patient and insurance preference. Use up to four times daily as directed. (FOR ICD-10 E10.9, E11.9).  ? Blood Glucose Monitoring Suppl (BLOOD GLUCOSE MONITOR SYSTEM) w/Device KIT Use up to four times daily as directed  ? dicyclomine (BENTYL) 10 MG capsule Take 1 capsule (10 mg total) by mouth 3 (three) times daily before meals.  ? DULoxetine (CYMBALTA) 30 MG capsule Take 15 mg by mouth daily. 1/2 pill daily  ? folic acid (FOLVITE) 1 MG tablet Take 2 tablets (2 mg total) by mouth daily.  ? Glucerna (GLUCERNA) LIQD Take 237 mLs by mouth daily.  ? glucose blood (ACCU-CHEK GUIDE) test strip Use up to four times daily as directed  ? Insulin Pen Needle 32G X 4 MM MISC Use twice daily with Levemir Flexpen  ? lidocaine (LIDODERM) 5 % Place 2 patches onto the skin daily. Remove & Discard patch within 12 hours or as directed by MD  ? magnesium hydroxide (MILK OF MAGNESIA) 400 MG/5ML suspension Take 30 mLs by mouth daily as needed for mild constipation.  ? melatonin 5 MG TABS Take 5 mg by mouth every evening.  ? pantoprazole (PROTONIX) 40 MG tablet Take 1 tablet (40 mg total) by mouth 2 (two) times daily.  ? PARoxetine (PAXIL) 40 MG tablet Take 1 tablet (40 mg total) by mouth at bedtime.  ? Vitamin D, Ergocalciferol, (DRISDOL) 1.25 MG (50000 UNIT) CAPS capsule Take 1 capsule (50,000 Units total) by mouth once a week.  ? [DISCONTINUED] benzocaine (ORAJEL) 10 % mucosal gel Use as directed in the mouth or throat with breakfast, with lunch, and with evening meal. (Patient not taking: Reported on 01/09/2022)  ? [DISCONTINUED] temazepam (RESTORIL) 15 MG capsule Take 1 capsule (15 mg total) by mouth at bedtime as needed for sleep.  ? ?No facility-administered encounter medications on file as of 01/23/2022.  ? ? ?Review of Systems  ?Constitutional:  Negative for appetite change,  chills, fatigue and fever.  ?HENT:  Negative for congestion, hearing loss, rhinorrhea and sore throat.   ?Eyes: Negative.   ?Respiratory:  Negative for cough, shortness of breath and wheezing.   ?Cardiovascular:  Negative for chest pain, palpitations and leg swelling.  ?Gastrointestinal:  Negative for abdominal pain, constipation, diarrhea, nausea and vomiting.  ?Genitourinary:  Negative for dysuria.  ?Musculoskeletal:  Negative for arthralgias, back pain and myalgias.  ?Skin:  Negative for color change, rash and wound.  ?Neurological:  Negative for dizziness, weakness and headaches.  ?Psychiatric/Behavioral:  Negative  for behavioral problems. The patient is not nervous/anxious.    ? ? ? ?Immunization History  ?Administered Date(s) Administered  ? Tdap 02/26/2012  ? ?Pertinent  Health Maintenance Due  ?Topic Date Due  ? FOOT EXAM  Never done  ? OPHTHALMOLOGY EXAM  Never done  ? URINE MICROALBUMIN  Never done  ? COLONOSCOPY (Pts 45-57yr Insurance coverage will need to be confirmed)  Never done  ? INFLUENZA VACCINE  Never done  ? HEMOGLOBIN A1C  07/12/2022  ? MAMMOGRAM  09/08/2023  ? DEXA SCAN  Completed  ? ? ?  01/14/2022  ?  8:00 AM 01/14/2022  ?  9:00 PM 01/15/2022  ?  8:00 AM 01/15/2022  ?  9:00 PM 01/16/2022  ?  1:00 PM  ?Fall Risk  ?Patient Fall Risk Level _0   ? ? ? ?Vitals:  ? 01/23/22 1355  ?BP: 110/70  ?Pulse: (!) 108  ?Resp: 18  ?Temp: 97.9 ?F (36.6 ?C)  ?SpO2: 99%  ?Weight: 130 lb 6.4 oz (59.1 kg)  ?Height: _1  (1.651 m)  ? ?Body mass index is 21.7 kg/m?. ? ?Physical Exam ?Constitutional:   ?   Appearance: Normal appearance.  ?HENT:  ?   Head: Normocephalic and atraumatic.  ?   Nose: Nose normal.  ?   Mouth/Throat:  ?   Mouth: Mucous membranes are moist.  ?Eyes:  ?   Conjunctiva/sclera: Conjunctivae normal.  ?Cardiovascular:  ?   Rate and Rhythm: Normal rate and regular rhythm.  ?Pulmonary:  ?   Effort: Pulmonary effort is normal.  ?    Breath sounds: Normal breath sounds.  ?Abdominal:  ?   General: Bowel sounds are normal.  ?   Palpations: Abdomen is soft.  ?Musculoskeletal:     ?   General: Normal range of motion.  ?   Cervical back: No

## 2022-01-23 NOTE — Patient Instructions (Signed)
See assessment and plan under each diagnosis in the problem list and acutely for this visit 

## 2022-01-25 ENCOUNTER — Inpatient Hospital Stay: Payer: Medicare PPO | Admitting: Infectious Diseases

## 2022-01-25 ENCOUNTER — Telehealth: Payer: Self-pay

## 2022-01-25 NOTE — Telephone Encounter (Signed)
Called pt to rescheduled missed appointment. Pt is in a skilled nursing facility and staff stated that they were unaware of an appointment for today and will have their scheduler to call us back to reschedule her appointment.  ?

## 2022-01-31 ENCOUNTER — Encounter: Payer: Self-pay | Admitting: Adult Health

## 2022-01-31 ENCOUNTER — Non-Acute Institutional Stay (SKILLED_NURSING_FACILITY): Payer: Medicare PPO | Admitting: Adult Health

## 2022-01-31 DIAGNOSIS — E162 Hypoglycemia, unspecified: Secondary | ICD-10-CM

## 2022-01-31 DIAGNOSIS — E1122 Type 2 diabetes mellitus with diabetic chronic kidney disease: Secondary | ICD-10-CM

## 2022-01-31 DIAGNOSIS — K219 Gastro-esophageal reflux disease without esophagitis: Secondary | ICD-10-CM

## 2022-01-31 DIAGNOSIS — I2782 Chronic pulmonary embolism: Secondary | ICD-10-CM

## 2022-01-31 DIAGNOSIS — N184 Chronic kidney disease, stage 4 (severe): Secondary | ICD-10-CM

## 2022-01-31 DIAGNOSIS — M797 Fibromyalgia: Secondary | ICD-10-CM | POA: Diagnosis not present

## 2022-01-31 DIAGNOSIS — K589 Irritable bowel syndrome without diarrhea: Secondary | ICD-10-CM | POA: Diagnosis not present

## 2022-01-31 DIAGNOSIS — F419 Anxiety disorder, unspecified: Secondary | ICD-10-CM

## 2022-01-31 DIAGNOSIS — F5101 Primary insomnia: Secondary | ICD-10-CM

## 2022-01-31 DIAGNOSIS — E7800 Pure hypercholesterolemia, unspecified: Secondary | ICD-10-CM | POA: Diagnosis not present

## 2022-01-31 DIAGNOSIS — R000 Tachycardia, unspecified: Secondary | ICD-10-CM

## 2022-01-31 NOTE — Progress Notes (Addendum)
? ?Location:  [facility] ?Nursing Home Room Number: [phone] H ?Place of Service:  SNF (31) ?Provider:  Durenda Age, DNP, FNP-BC ? ?Patient Care Team: ?[name], MD as PCP - General (Family Medicine) ? ?Extended Emergency Contact Information ?Primary Emergency Contact: Zito,[name] ?Address: [address] ?         [place], [place] [address] [place] ?Mobile Phone: [phone] ?Relation: Daughter ? ?Code Status:  DNR ? ?Goals of care: Advanced Directive information ? ?  01/23/2022  ?  3:08 PM  ?Advanced Directives  ?Does Patient Have a Medical Advance Directive? No  ?Would patient like information on creating a medical advance directive? No - Patient declined  ? ? ? ?Chief Complaint  ?Patient presents with  ? Discharge Note  ?  For possible discharge home on 02/03/22 with Home health PT and OT  ? ? ?HPI:  ?Pt is a 71 y.o. female who is for discharge home on 02/03/22 with Home health PT and OT. ? ?She was admitted to [facility] on 01/16/22 post hospital admission 01/09/22 to 01/16/22.  She has a PMH all GERD, anxiety, hyperlipidemia, hypertension, diabetes mellitus, CKD stage IV and recent pulmonary embolism. She presented secondary to altered mental status/confusion/syncope and found to have severe hypoglycemia. D10 IV fluids was given with improvement of symptoms. EEG showed no seizure or epileptiform discharges. ? ?At [facility], stay was complicated by fibromyalgia for which she was started on Duloxetine 20 mg daily. She had tachycardia for which Amlodipine was discontinued and was started to Metoprolol tartrate 12.5 mg BID. ? ?Patient was admitted to this facility for short-term rehabilitation after the patient's recent hospitalization.  Patient has completed SNF rehabilitation and therapy has cleared the patient for discharge. ? ? ?Addendum 02/05/22:   Patient was not discharged home. Insurance approved extension of short-term rehabilitation ? ?Past  Medical History:  ?Diagnosis Date  ? Anxiety   ? Arthritis   ? Asthma   ? Cardiac arrest ([place]) 10/28/2021  ? Depression   ? Diabetes mellitus without complication ([place])   ? Fibromyalgia   ? GERD (gastroesophageal reflux disease)   ? Hyperlipemia   ? Hypertension   ? PONV (postoperative nausea and vomiting)   ? ?Past Surgical History:  ?Procedure Laterality Date  ? COLONOSCOPY    ? DILATION AND CURETTAGE OF UTERUS    ? IR EMBO ART  VEN HEMORR LYMPH EXTRAV  INC GUIDE ROADMAPPING  11/01/2021  ? IR FLUORO GUIDE CV LINE RIGHT  11/20/2021  ? IR REMOVAL TUN CV CATH W/O FL  12/20/2021  ? IR THROMBECT PRIM MECH INIT (INCLU) MOD SED  10/29/2021  ? IR US GUIDE VASC ACCESS RIGHT  10/29/2021  ? IR US GUIDE VASC ACCESS RIGHT  11/01/2021  ? IR US GUIDE VASC ACCESS RIGHT  11/21/2021  ? KNEE ARTHROSCOPY  [id number]  ? left  ? SHOULDER ARTHROSCOPY    ? left  ? TRIGGER FINGER RELEASE  08/13/2012  ? Procedure: RELEASE TRIGGER FINGER/A-1 PULLEY;  Surgeon: [name]., MD;  Location: [facility];  Service: Orthopedics;  Laterality: Right;  long finger  ? TUBAL LIGATION    ? ? ?Allergies  ?Allergen Reactions  ? Aspirin Nausea And Vomiting  ?  Other reaction(s): Unknown  ? Benadryl [Diphenhydramine]   ?  Can only take dye free. States the dye causes itching.  ? Darvon [Propoxyphene] Itching  ?  Can only during the day. Night time makes her  itch  ? Hydrocodone Bit-Homatrop Mbr Itching  ?  Pre medicate  ? Metformin   ?  Other reaction(s): GI  ? Other   ?  Other reaction(s): Unknown. States reaction was to nasal spray that caused pupils to shrink  ? Pentazocine   ?  nervous ?Other reaction(s): jitters  ? Percocet [Oxycodone-Acetaminophen]   ?  Itching; pre medicate  ? Sulfa Antibiotics Hives  ? Tetracaine Hcl   ?  Other reaction(s): Unknown. Thinks caused itching.  ? Tetracyclines & Related Hives  ? Tramadol   ?  Other reaction(s): itch  ? ? ?Outpatient Encounter Medications as of 01/31/2022  ?Medication Sig  ? DULoxetine (CYMBALTA)  20 MG capsule Take 1 capsule (20 mg total) by mouth daily.  ? metoprolol tartrate (LOPRESSOR) 25 MG tablet Take 0.5 tablets (12.5 mg total) by mouth 2 (two) times daily.  ? Accu-Chek Softclix Lancets lancets Use up to four times daily as directed  ? acetaminophen (TYLENOL) 325 MG tablet Take 2 tablets (650 mg total) by mouth every 4 (four) hours as needed for fever (greater than 37.6 C).  ? albuterol (VENTOLIN HFA) 108 (90 Base) MCG/ACT inhaler Inhale 1-2 puffs into the lungs every 4 (four) hours as needed for wheezing or shortness of breath.  ? apixaban (ELIQUIS) 5 MG TABS tablet Take 1 tablet (5 mg total) by mouth 2 (two) times daily.  ? atorvastatin (LIPITOR) 20 MG tablet Take 1 tablet (20 mg total) by mouth daily.  ? B Complex-C (B-COMPLEX WITH VITAMIN C) tablet Take 1 tablet by mouth daily.  ? bisacodyl (DULCOLAX) 10 MG suppository Place 10 mg rectally as needed for moderate constipation.  ? blood glucose meter kit and supplies Dispense based on patient and insurance preference. Use up to four times daily as directed. (FOR ICD-10 E10.9, E11.9).  ? Blood Glucose Monitoring Suppl (BLOOD GLUCOSE MONITOR SYSTEM) w/Device KIT Use up to four times daily as directed  ? dicyclomine (BENTYL) 10 MG capsule Take 1 capsule (10 mg total) by mouth 3 (three) times daily before meals.  ? folic acid (FOLVITE) 1 MG tablet Take 2 tablets (2 mg total) by mouth daily.  ? Glucerna (GLUCERNA) LIQD Take 237 mLs by mouth daily.  ? glucose blood (ACCU-CHEK GUIDE) test strip Use up to four times daily as directed  ? Insulin Pen Needle 32G X 4 MM MISC Use twice daily with Levemir Flexpen  ? lidocaine (LIDODERM) 5 % Place 2 patches onto the skin daily. Remove & Discard patch within 12 hours or as directed by MD  ? magnesium hydroxide (MILK OF MAGNESIA) 400 MG/5ML suspension Take 30 mLs by mouth daily as needed for mild constipation.  ? melatonin 5 MG TABS Take 5 mg by mouth every evening.  ? pantoprazole (PROTONIX) 40 MG tablet Take 1  tablet (40 mg total) by mouth 2 (two) times daily.  ? PARoxetine (PAXIL) 40 MG tablet Take 1 tablet (40 mg total) by mouth at bedtime.  ? Vitamin D, Ergocalciferol, (DRISDOL) 1.25 MG (50000 UNIT) CAPS capsule Take 1 capsule (50,000 Units total) by mouth once a week.  ? [DISCONTINUED] albuterol (VENTOLIN HFA) 108 (90 Base) MCG/ACT inhaler Inhale 1-2 puffs into the lungs every 4 (four) hours as needed for wheezing or shortness of breath.  ? [DISCONTINUED] amLODipine (NORVASC) 10 MG tablet Take 1 tablet (10 mg total) by mouth daily at 8 pm.  ? [DISCONTINUED] apixaban (ELIQUIS) 5 MG TABS tablet Take 1 tablet (5 mg total) by mouth 2 (two)  times daily.  ? [DISCONTINUED] atorvastatin (LIPITOR) 20 MG tablet Take 1 tablet (20 mg total) by mouth daily.  ? [DISCONTINUED] dicyclomine (BENTYL) 10 MG capsule Take 1 capsule (10 mg total) by mouth 3 (three) times daily before meals.  ? [DISCONTINUED] DULoxetine (CYMBALTA) 30 MG capsule Take 15 mg by mouth daily. 1/2 pill daily  ? [DISCONTINUED] folic acid (FOLVITE) 1 MG tablet Take 2 tablets (2 mg total) by mouth daily.  ? [DISCONTINUED] lidocaine (LIDODERM) 5 % Place 2 patches onto the skin daily. Remove & Discard patch within 12 hours or as directed by MD  ? [DISCONTINUED] pantoprazole (PROTONIX) 40 MG tablet Take 1 tablet (40 mg total) by mouth 2 (two) times daily.  ? [DISCONTINUED] PARoxetine (PAXIL) 40 MG tablet Take 1 tablet (40 mg total) by mouth at bedtime.  ? ?No facility-administered encounter medications on file as of 01/31/2022.  ? ? ?Review of Systems  ?Constitutional:  Negative for appetite change, chills, fatigue and fever.  ?HENT:  Negative for congestion, hearing loss, rhinorrhea and sore throat.   ?Eyes: Negative.   ?Respiratory:  Negative for cough, shortness of breath and wheezing.   ?Cardiovascular:  Negative for chest pain, palpitations and leg swelling.  ?Gastrointestinal:  Negative for abdominal pain, constipation, diarrhea, nausea and vomiting.   ?Genitourinary:  Negative for dysuria.  ?Musculoskeletal:  Negative for arthralgias, back pain and myalgias.  ?Skin:  Negative for color change, rash and wound.  ?Neurological:  Negative for dizziness, weakness and heada

## 2022-02-01 ENCOUNTER — Other Ambulatory Visit: Payer: Self-pay | Admitting: Adult Health

## 2022-02-01 DIAGNOSIS — K219 Gastro-esophageal reflux disease without esophagitis: Secondary | ICD-10-CM

## 2022-02-01 DIAGNOSIS — R000 Tachycardia, unspecified: Secondary | ICD-10-CM

## 2022-02-01 DIAGNOSIS — M797 Fibromyalgia: Secondary | ICD-10-CM

## 2022-02-01 DIAGNOSIS — E7800 Pure hypercholesterolemia, unspecified: Secondary | ICD-10-CM

## 2022-02-01 MED ORDER — PANTOPRAZOLE SODIUM 40 MG PO TBEC: 40.0000 mg | DELAYED_RELEASE_TABLET | Freq: Two times a day (BID) | ORAL | 0 refills | Status: DC

## 2022-02-01 MED ORDER — APIXABAN 5 MG PO TABS: 5.0000 mg | ORAL_TABLET | Freq: Two times a day (BID) | ORAL | 0 refills | Status: DC

## 2022-02-01 MED ORDER — ALBUTEROL SULFATE HFA 108 (90 BASE) MCG/ACT IN AERS: 1.0000 | INHALATION_SPRAY | RESPIRATORY_TRACT | 0 refills | Status: DC | PRN

## 2022-02-01 MED ORDER — DICYCLOMINE HCL 10 MG PO CAPS: 10.0000 mg | ORAL_CAPSULE | Freq: Three times a day (TID) | ORAL | 0 refills | Status: DC

## 2022-02-01 MED ORDER — LIDOCAINE 5 % EX PTCH: 2.0000 | MEDICATED_PATCH | CUTANEOUS | 0 refills | Status: DC

## 2022-02-01 MED ORDER — FOLIC ACID 1 MG PO TABS: 2.0000 mg | ORAL_TABLET | Freq: Every day | ORAL | 0 refills | Status: DC

## 2022-02-01 MED ORDER — ATORVASTATIN CALCIUM 20 MG PO TABS: 20.0000 mg | ORAL_TABLET | Freq: Every day | ORAL | 0 refills | Status: DC

## 2022-02-01 MED ORDER — PAROXETINE HCL 40 MG PO TABS: 40.0000 mg | ORAL_TABLET | Freq: Every day | ORAL | 0 refills | Status: DC

## 2022-02-01 MED ORDER — DULOXETINE HCL 20 MG PO CPEP: 20.0000 mg | ORAL_CAPSULE | Freq: Every day | ORAL | 0 refills | Status: DC

## 2022-02-01 MED ORDER — METOPROLOL TARTRATE 25 MG PO TABS: 12.5000 mg | ORAL_TABLET | Freq: Two times a day (BID) | ORAL | 0 refills | Status: DC

## 2022-02-04 ENCOUNTER — Encounter: Payer: Self-pay | Admitting: Internal Medicine

## 2022-02-04 ENCOUNTER — Encounter: Payer: Self-pay | Admitting: Infectious Diseases

## 2022-02-05 ENCOUNTER — Encounter: Payer: Self-pay | Admitting: Internal Medicine

## 2022-02-05 ENCOUNTER — Encounter: Payer: Self-pay | Admitting: Adult Health

## 2022-02-05 NOTE — Addendum Note (Signed)
Addended by: Durenda Age C on: 02/05/2022 01:37 AM ? ? Modules accepted: Level of Service ? ?

## 2022-02-05 NOTE — Progress Notes (Signed)
Chart opened in error

## 2022-02-05 NOTE — Progress Notes (Deleted)
? ?Location:  [facility] ?Nursing Home Room Number: 308-A ?Place of Service:  SNF (31) ?Provider:  Durenda Age, DNP, FNP-BC ? ?Patient Care Team: ?[name], MD as PCP - General (Family Medicine) ? ?Extended Emergency Contact Information ?Primary Emergency Contact: Reichard,[name] ?Address: [address] ?         [place], [place] [address] [place] ?Mobile Phone: [phone] ?Relation: Daughter ? ?Code Status:  DNR ? ?Goals of care: Advanced Directive information ? ?  02/05/2022  ?  9:32 AM  ?Advanced Directives  ?Does Patient Have a Medical Advance Directive? Yes  ?Type of Advance Directive Out of facility DNR (pink MOST or yellow form)  ?Does patient want to make changes to medical advance directive? No - Patient declined  ?Pre-existing out of facility DNR order (yellow form or pink MOST form) Yellow form placed in chart (order not valid for inpatient use)  ? ? ? ?Chief Complaint  ?Patient presents with  ?? Advanced Directive  ?  Care plan meeting   ? ? ?HPI:  ?Pt is a 71 y.o. female seen today for medical management of chronic diseases.  *** ? ? ?Past Medical History:  ?Diagnosis Date  ?? Anxiety   ?? Arthritis   ?? Asthma   ?? Cardiac arrest ([place]) 10/28/2021  ?? Depression   ?? Diabetes mellitus without complication ([place])   ?? Fibromyalgia   ?? GERD (gastroesophageal reflux disease)   ?? Hyperlipemia   ?? Hypertension   ?? PONV (postoperative nausea and vomiting)   ? ?Past Surgical History:  ?Procedure Laterality Date  ?? COLONOSCOPY    ?? DILATION AND CURETTAGE OF UTERUS    ?? IR EMBO ART  VEN HEMORR LYMPH EXTRAV  INC GUIDE ROADMAPPING  11/01/2021  ?? IR FLUORO GUIDE CV LINE RIGHT  11/20/2021  ?? IR REMOVAL TUN CV CATH W/O FL  12/20/2021  ?? IR THROMBECT PRIM MECH INIT (INCLU) MOD SED  10/29/2021  ?? IR US GUIDE VASC ACCESS RIGHT  10/29/2021  ?? IR US GUIDE VASC ACCESS RIGHT  11/01/2021  ?? IR US GUIDE VASC ACCESS RIGHT  11/21/2021  ?? KNEE ARTHROSCOPY  [id number]  ? left  ??  SHOULDER ARTHROSCOPY    ? left  ?? TRIGGER FINGER RELEASE  08/13/2012  ? Procedure: RELEASE TRIGGER FINGER/A-1 PULLEY;  Surgeon: [name]., MD;  Location: [facility];  Service: Orthopedics;  Laterality: Right;  long finger  ?? TUBAL LIGATION    ? ? ?Allergies  ?Allergen Reactions  ?? Aspirin Nausea And Vomiting  ?  Other reaction(s): Unknown  ?? Benadryl [Diphenhydramine]   ?  Can only take dye free. States the dye causes itching.  ?? Darvon [Propoxyphene] Itching  ?  Can only during the day. Night time makes her itch  ?? Hydrocodone Bit-Homatrop Mbr Itching  ?  Pre medicate  ?? Metformin   ?  Other reaction(s): GI  ?? Other   ?  Other reaction(s): Unknown. States reaction was to nasal spray that caused pupils to shrink  ?? Pentazocine   ?  nervous ?Other reaction(s): jitters  ?? Percocet [Oxycodone-Acetaminophen]   ?  Itching; pre medicate  ?? Sulfa Antibiotics Hives  ?? Tetracaine Hcl   ?  Other reaction(s): Unknown. Thinks caused itching.  ?? Tetracyclines & Related Hives  ?? Tramadol   ?  Other reaction(s): itch  ? ? ?Outpatient Encounter Medications as of 02/05/2022  ?Medication Sig  ?? acetaminophen (TYLENOL) 325 MG tablet Take  2 tablets (650 mg total) by mouth every 4 (four) hours as needed for fever (greater than 37.6 C).  ?? albuterol (VENTOLIN HFA) 108 (90 Base) MCG/ACT inhaler Inhale 1-2 puffs into the lungs every 4 (four) hours as needed for wheezing or shortness of breath.  ?? apixaban (ELIQUIS) 5 MG TABS tablet Take 1 tablet (5 mg total) by mouth 2 (two) times daily.  ?? atorvastatin (LIPITOR) 20 MG tablet Take 1 tablet (20 mg total) by mouth daily.  ?? B Complex-C (B-COMPLEX WITH VITAMIN C) tablet Take 1 tablet by mouth daily.  ?? bisacodyl (DULCOLAX) 10 MG suppository Place 10 mg rectally as needed for moderate constipation.  ?? dicyclomine (BENTYL) 10 MG capsule Take 1 capsule (10 mg total) by mouth 3 (three) times daily before meals.  ?? DULoxetine (CYMBALTA) 20 MG capsule  Take 1 capsule (20 mg total) by mouth daily.  ?? folic acid (FOLVITE) 1 MG tablet Take 2 tablets (2 mg total) by mouth daily.  ?? Glucerna (GLUCERNA) LIQD Take 237 mLs by mouth daily.  ?? lidocaine (LIDODERM) 5 % Place 2 patches onto the skin daily. Remove & Discard patch within 12 hours or as directed by MD  ?? magnesium hydroxide (MILK OF MAGNESIA) 400 MG/5ML suspension Take 30 mLs by mouth daily as needed for mild constipation.  ?? melatonin 5 MG TABS Take 5 mg by mouth every evening.  ?? metoprolol tartrate (LOPRESSOR) 25 MG tablet Take 0.5 tablets (12.5 mg total) by mouth 2 (two) times daily.  ?? pantoprazole (PROTONIX) 40 MG tablet Take 1 tablet (40 mg total) by mouth 2 (two) times daily.  ?? PARoxetine (PAXIL) 40 MG tablet Take 1 tablet (40 mg total) by mouth at bedtime.  ?? Vitamin D, Ergocalciferol, (DRISDOL) 1.25 MG (50000 UNIT) CAPS capsule Take 1 capsule (50,000 Units total) by mouth once a week.  ?? Accu-Chek Softclix Lancets lancets Use up to four times daily as directed  ?? blood glucose meter kit and supplies Dispense based on patient and insurance preference. Use up to four times daily as directed. (FOR ICD-10 E10.9, E11.9).  ?? Blood Glucose Monitoring Suppl (BLOOD GLUCOSE MONITOR SYSTEM) w/Device KIT Use up to four times daily as directed  ?? glucose blood (ACCU-CHEK GUIDE) test strip Use up to four times daily as directed  ?? Insulin Pen Needle 32G X 4 MM MISC Use twice daily with Levemir Flexpen  ? ?No facility-administered encounter medications on file as of 02/05/2022.  ? ? ?Review of Systems *** ? ? ? ?Immunization History  ?Administered Date(s) Administered  ?? Tdap 02/26/2012  ? ?Pertinent  Health Maintenance Due  ?Topic Date Due  ?? FOOT EXAM  Never done  ?? OPHTHALMOLOGY EXAM  Never done  ?? URINE MICROALBUMIN  Never done  ?? COLONOSCOPY (Pts 45-59yr Insurance coverage will need to be confirmed)  Never done  ?? INFLUENZA VACCINE  05/28/2022  ?? HEMOGLOBIN A1C  07/12/2022  ?? MAMMOGRAM   09/08/2023  ?? DEXA SCAN  Completed  ? ? ?  01/14/2022  ?  8:00 AM 01/14/2022  ?  9:00 PM 01/15/2022  ?  8:00 AM 01/15/2022  ?  9:00 PM 01/16/2022  ?  1:00 PM  ?Fall Risk  ?Patient Fall Risk Level _0   ? ? ? ?Vitals:  ? 02/05/22 0909  ?BP: (!) 195/65  ?Pulse: 84  ?Resp: 20  ?Temp: 97.7 ?F (36.5 ?C)  ?Weight: 128 lb  12.8 oz (58.4 kg)  ?Height: _0  (1.651 m)  ? ?Body mass index is 21.43 kg/m?. ? ?Physical Exam  ? ? ? ?Labs reviewed: ?Recent Labs  ?  12/04/21 ?0105 12/05/21 ?1712 12/25/21 ?7871 12/26/21 ?8367 12/27/21 ?0518 12/30/21 ?0224 01/10/22 ?0303 01/10/22 ?1822 01/11/22 ?1148 01/22/22 ?0000 01/23/22 ?0000  ?NA 134*   < > 141 141 141   < > 135  --  133* 138 139  ?K 4.6   < > 5.4* 5.4* 4.9   < > 3.2*  --  3.6 4.1 4.1  ?CL 97*   < > 109 107 106   < > 108  --  107 104 103  ?CO2 28   < > _1 < > 18*  --  19* 16 18  ?GLUCOSE 129*   < > 153* 116* 110*   < > 86 101* 123*  --   --   ?BUN 53*   < > 43* 45* 42*   < > 27*  --  22 24* 23*  ?CREATININE 4.97*   < > 3.12* 3.18* 3.15*   < > 2.18*  --  2.14* 2.2* 2.5*  ?CALCIUM 8.8*   < > 8.9 9.2 9.4   < > 9.0  --  9.1 10.3 10.2  ?MG 2.0  --   --   --   --   --  1.6*  --   --   --  1.9  ?PHOS  --    < > 5.0* 5.3* 5.3*  --   --   --   --   --   --   ? < > = values in this interval not displayed.  ? ?Recent Labs  ?  01/07/22 ?2550 01/09/22 ?1023 01/10/22 ?0303 01/23/22 ?0000  ?AST _2 ?ALT _3 ?ALKPHOS 67 74 62 88  ?BILITOT 0.4 0.4 0.3  --   ?PROT 7.1 8.4* 6.9  --   ?ALBUMIN 2.9* 3.6 3.1* 4.2  ? ?Recent Labs  ?  01/03/22 ?0164 01/07/22 ?2903 01/09/22 ?1023 01/10/22 ?0303 01/11/22 ?1148 01/22/22 ?0000 01/23/22 ?0000  ?WBC 9.9 8.4 9.8 7.8 8.0 7.6 7.3  ?NEUTROABS 6.9 4.9 8.6*  --   --   --   --   ?HGB 7.8* 8.3* 9.3* 8.1* 8.9* 10.6* 10.2*  ?HCT 24.7* 25.2* 28.9* 25.7* 27.4* 32* 31*  ?MCV 89.2 86.3 88.7 89.9 88.7  --   --   ?PLT 290 355 432* 355 395 370 377  ? ?Lab Results  ?Component Value Date   ? TSH 1.26 01/23/2022  ? ?Lab Results  ?Component Value Date  ? HGBA1C 6.3 (H) 01/09/2022  ? ?Lab Results  ?Component Value Date  ? TRIG 154 (H) 11/15/2021  ? ? ?Significant Diagnostic Results in last 30 day

## 2022-02-06 ENCOUNTER — Encounter: Payer: Self-pay | Admitting: Podiatry

## 2022-02-06 ENCOUNTER — Ambulatory Visit (INDEPENDENT_AMBULATORY_CARE_PROVIDER_SITE_OTHER): Payer: Medicare PPO | Admitting: Podiatry

## 2022-02-06 DIAGNOSIS — M79674 Pain in right toe(s): Secondary | ICD-10-CM | POA: Diagnosis not present

## 2022-02-06 DIAGNOSIS — N189 Chronic kidney disease, unspecified: Secondary | ICD-10-CM

## 2022-02-06 DIAGNOSIS — Z794 Long term (current) use of insulin: Secondary | ICD-10-CM

## 2022-02-06 DIAGNOSIS — F4323 Adjustment disorder with mixed anxiety and depressed mood: Secondary | ICD-10-CM | POA: Diagnosis not present

## 2022-02-06 DIAGNOSIS — I890 Lymphedema, not elsewhere classified: Secondary | ICD-10-CM

## 2022-02-06 DIAGNOSIS — E1169 Type 2 diabetes mellitus with other specified complication: Secondary | ICD-10-CM

## 2022-02-06 DIAGNOSIS — B351 Tinea unguium: Secondary | ICD-10-CM

## 2022-02-06 DIAGNOSIS — M79675 Pain in left toe(s): Secondary | ICD-10-CM | POA: Diagnosis not present

## 2022-02-06 DIAGNOSIS — N179 Acute kidney failure, unspecified: Secondary | ICD-10-CM

## 2022-02-06 DIAGNOSIS — F5101 Primary insomnia: Secondary | ICD-10-CM | POA: Diagnosis not present

## 2022-02-06 NOTE — Progress Notes (Signed)
This patient returns to my office for at risk foot care.  This patient requires this care by a professional since this patient will be at risk due to having CKD, throbocytopenia and diabetes.  This patient is unable to cut nails herself since the patient cannot reach her nails.These nails are painful walking and wearing shoes.  She presents to the office with her daughter in a wheelchair.  This patient presents for at risk foot care today. ? ?General Appearance  Alert, conversant and in no acute stress. ? ?Vascular  Dorsalis pedis and posterior tibial  pulses are palpable  bilaterally.  Capillary return is within normal limits  bilaterally. Temperature is within normal limits  bilaterally. ? ?Neurologic  Senn-Weinstein monofilament wire test within normal limits  bilaterally. Muscle power within normal limits bilaterally. ? ?Nails Thick disfigured discolored nails with subungual debris  from hallux to fifth toes bilaterally. No evidence of bacterial infection or drainage bilaterally. ? ?Orthopedic  No limitations of motion  feet .  No crepitus or effusions noted.  No bony pathology or digital deformities noted. ? ?Skin  normotropic skin with no porokeratosis noted bilaterally.  No signs of infections or ulcers noted.    ? ?Onychomycosis  Pain in right toes  Pain in left toes ? ?Consent was obtained for treatment procedures.   Mechanical debridement of nails 1-5  bilaterally performed with a nail nipper.  Filed with dremel without incident.  ? ? ?Return office visit   prn                  Told patient to return for periodic foot care and evaluation due to potential at risk complications. ? ? ?Gardiner Barefoot DPM   ?

## 2022-02-07 ENCOUNTER — Non-Acute Institutional Stay (SKILLED_NURSING_FACILITY): Payer: Medicare PPO | Admitting: Adult Health

## 2022-02-07 ENCOUNTER — Encounter: Payer: Self-pay | Admitting: Adult Health

## 2022-02-07 DIAGNOSIS — I2782 Chronic pulmonary embolism: Secondary | ICD-10-CM | POA: Diagnosis not present

## 2022-02-07 DIAGNOSIS — K589 Irritable bowel syndrome without diarrhea: Secondary | ICD-10-CM

## 2022-02-07 DIAGNOSIS — K219 Gastro-esophageal reflux disease without esophagitis: Secondary | ICD-10-CM

## 2022-02-07 DIAGNOSIS — R000 Tachycardia, unspecified: Secondary | ICD-10-CM | POA: Diagnosis not present

## 2022-02-07 NOTE — Progress Notes (Signed)
? ?Location:  [facility] ?Nursing Home Room Number: 161W ?Place of Service:  SNF (31) ?Provider:  Durenda Age, DNP, FNP-BC ? ?Patient Care Team: ?[name], MD as PCP - General (Family Medicine) ? ?Extended Emergency Contact Information ?Primary Emergency Contact: Po[name] ?Address: [address] ?         [place], [place] [address] [place] ?Mobile Phone: [phone] ?Relation: Daughter ? ?Code Status:  DNR ? ?Goals of care: Advanced Directive information ? ?  02/07/2022  ?  3:28 PM  ?Advanced Directives  ?Does Patient Have a Medical Advance Directive? Yes  ?Type of Advance Directive Out of facility DNR (pink MOST or yellow form)  ?Does patient want to make changes to medical advance directive? No - Patient declined  ?Pre-existing out of facility DNR order (yellow form or pink MOST form) Yellow form placed in chart (order not valid for inpatient use)  ? ? ? ?Chief Complaint  ?Patient presents with  ? Acute Visit  ?  Short term rehab  ? ? ?HPI:  ?Pt is a 71 y.o. female seen today for short-term rehabilitation visit. She is currently having PT, OT and ST.  She has a PMH of GERD, anxiety, hyperlipidemia, hypertension, diabetes mellitus, CKD stage IV and recent pulmonary embolism. ? ?Other chronic pulmonary embolism, unspecified whether acute cor pulmonale present ([place])  -  no reported shortness of breath, takes Xarelto 5 mg 1 tab twice a day ? ?Irritable bowel syndrome, unspecified type-   no reported abdominal pain, takes dicyclomine 10 mg 1 capsule before each meal ? ?Tachycardia-  HRs ranging from 65-89, takes metoprolol succinate ER 12.5 mg BID ? ?Gastroesophageal reflux disease without esophagitis  -denies acid reflux, pantoprazole 40 mg 1 tab twice a day ? ? ?Past Medical History:  ?Diagnosis Date  ? Anxiety   ? Arthritis   ? Asthma   ? Cardiac arrest ([place]) 10/28/2021  ? Depression   ? Diabetes mellitus without complication ([place])   ? Fibromyalgia   ? GERD  (gastroesophageal reflux disease)   ? Hyperlipemia   ? Hypertension   ? PONV (postoperative nausea and vomiting)   ? ?Past Surgical History:  ?Procedure Laterality Date  ? COLONOSCOPY    ? DILATION AND CURETTAGE OF UTERUS    ? IR EMBO ART  VEN HEMORR LYMPH EXTRAV  INC GUIDE ROADMAPPING  11/01/2021  ? IR FLUORO GUIDE CV LINE RIGHT  11/20/2021  ? IR REMOVAL TUN CV CATH W/O FL  12/20/2021  ? IR THROMBECT PRIM MECH INIT (INCLU) MOD SED  10/29/2021  ? IR US GUIDE VASC ACCESS RIGHT  10/29/2021  ? IR US GUIDE VASC ACCESS RIGHT  11/01/2021  ? IR US GUIDE VASC ACCESS RIGHT  11/21/2021  ? KNEE ARTHROSCOPY  [id number]  ? left  ? SHOULDER ARTHROSCOPY    ? left  ? TRIGGER FINGER RELEASE  08/13/2012  ? Procedure: RELEASE TRIGGER FINGER/A-1 PULLEY;  Surgeon: [name]., MD;  Location: [facility];  Service: Orthopedics;  Laterality: Right;  long finger  ? TUBAL LIGATION    ? ? ?Allergies  ?Allergen Reactions  ? Aspirin Nausea And Vomiting  ?  Other reaction(s): Unknown  ? Benadryl [Diphenhydramine]   ?  Can only take dye free. States the dye causes itching.  ? Darvon [Propoxyphene] Itching  ?  Can only during the day. Night time makes her itch  ? Hydrocodone Bit-Homatrop Mbr Itching  ?  Pre medicate  ? Metformin   ?  Other reaction(s): GI  ? Other   ?  Other reaction(s): Unknown. States reaction was to nasal spray that caused pupils to shrink  ? Pentazocine   ?  nervous ?Other reaction(s): jitters  ? Percocet [Oxycodone-Acetaminophen]   ?  Itching; pre medicate  ? Sulfa Antibiotics Hives  ? Tetracaine Hcl   ?  Other reaction(s): Unknown. Thinks caused itching.  ? Tetracyclines & Related Hives  ? Tramadol   ?  Other reaction(s): itch  ? ? ?Outpatient Encounter Medications as of 02/07/2022  ?Medication Sig  ? acetaminophen (TYLENOL) 325 MG tablet Take 2 tablets (650 mg total) by mouth every 4 (four) hours as needed for fever (greater than 37.6 C).  ? albuterol (VENTOLIN HFA) 108 (90 Base) MCG/ACT inhaler Inhale 1-2 puffs  into the lungs every 4 (four) hours as needed for wheezing or shortness of breath.  ? apixaban (ELIQUIS) 5 MG TABS tablet Take 1 tablet (5 mg total) by mouth 2 (two) times daily.  ? atorvastatin (LIPITOR) 20 MG tablet Take 1 tablet (20 mg total) by mouth daily.  ? B Complex-C (B-COMPLEX WITH VITAMIN C) tablet Take 1 tablet by mouth daily.  ? dicyclomine (BENTYL) 10 MG capsule Take 1 capsule (10 mg total) by mouth 3 (three) times daily before meals.  ? DULoxetine (CYMBALTA) 20 MG capsule Take 1 capsule (20 mg total) by mouth daily.  ? folic acid (FOLVITE) 1 MG tablet Take 2 tablets (2 mg total) by mouth daily.  ? Glucerna (GLUCERNA) LIQD Take 237 mLs by mouth daily.  ? lidocaine (LIDODERM) 5 % Place 2 patches onto the skin daily. Remove & Discard patch within 12 hours or as directed by MD  ? melatonin 5 MG TABS Take 5 mg by mouth every evening.  ? metoprolol tartrate (LOPRESSOR) 25 MG tablet Take 0.5 tablets (12.5 mg total) by mouth 2 (two) times daily.  ? pantoprazole (PROTONIX) 40 MG tablet Take 1 tablet (40 mg total) by mouth 2 (two) times daily.  ? PARoxetine (PAXIL) 40 MG tablet Take 1 tablet (40 mg total) by mouth at bedtime.  ? Vitamin D, Ergocalciferol, (DRISDOL) 1.25 MG (50000 UNIT) CAPS capsule Take 1 capsule (50,000 Units total) by mouth once a week.  ? [DISCONTINUED] Accu-Chek Softclix Lancets lancets Use up to four times daily as directed  ? [DISCONTINUED] bisacodyl (DULCOLAX) 10 MG suppository Place 10 mg rectally as needed for moderate constipation.  ? [DISCONTINUED] blood glucose meter kit and supplies Dispense based on patient and insurance preference. Use up to four times daily as directed. (FOR ICD-10 E10.9, E11.9).  ? [DISCONTINUED] Blood Glucose Monitoring Suppl (BLOOD GLUCOSE MONITOR SYSTEM) w/Device KIT Use up to four times daily as directed  ? [DISCONTINUED] glucose blood (ACCU-CHEK GUIDE) test strip Use up to four times daily as directed  ? [DISCONTINUED] Insulin Pen Needle 32G X 4 MM MISC  Use twice daily with Levemir Flexpen  ? [DISCONTINUED] magnesium hydroxide (MILK OF MAGNESIA) 400 MG/5ML suspension Take 30 mLs by mouth daily as needed for mild constipation.  ? ?No facility-administered encounter medications on file as of 02/07/2022.  ? ? ?Review of Systems  ?Constitutional:  Negative for appetite change, chills, fatigue and fever.  ?HENT:  Negative for congestion, hearing loss, rhinorrhea and sore throat.   ?Eyes: Negative.   ?Respiratory:  Negative for cough, shortness of breath and wheezing.   ?Cardiovascular:  Negative for chest pain, palpitations and leg swelling.  ?Gastrointestinal:  Negative for abdominal pain, constipation, diarrhea, nausea and vomiting.  ?  Genitourinary:  Negative for dysuria.  ?Musculoskeletal:  Negative for arthralgias, back pain and myalgias.  ?Skin:  Negative for color change, rash and wound.  ?Neurological:  Negative for dizziness, weakness and headaches.  ?Psychiatric/Behavioral:  Negative for behavioral problems. The patient is not nervous/anxious.    ? ? ? ?Immunization History  ?Administered Date(s) Administered  ? Tdap 02/26/2012  ? ?Pertinent  Health Maintenance Due  ?Topic Date Due  ? FOOT EXAM  Never done  ? OPHTHALMOLOGY EXAM  Never done  ? URINE MICROALBUMIN  Never done  ? COLONOSCOPY (Pts 45-66yr Insurance coverage will need to be confirmed)  Never done  ? INFLUENZA VACCINE  05/28/2022  ? HEMOGLOBIN A1C  07/12/2022  ? MAMMOGRAM  09/08/2023  ? DEXA SCAN  Completed  ? ? ?  01/14/2022  ?  8:00 AM 01/14/2022  ?  9:00 PM 01/15/2022  ?  8:00 AM 01/15/2022  ?  9:00 PM 01/16/2022  ?  1:00 PM  ?Fall Risk  ?Patient Fall Risk Level High fall risk High fall risk High fall risk High fall risk High fall risk  ? ? ? ?Vitals:  ? 02/07/22 1526  ?BP: 136/83  ?Pulse: 89  ?Resp: 18  ?Temp: 97.7 ?F (36.5 ?C)  ?Weight: 127 lb (57.6 kg)  ?Height: 5' 5"  (1.651 m)  ? ?Body mass index is 21.13 kg/m?. ? ?Physical Exam ?Constitutional:   ?   General: She is not in acute distress. ?    Appearance: Normal appearance.  ?HENT:  ?   Head: Normocephalic and atraumatic.  ?   Nose: Nose normal.  ?   Mouth/Throat:  ?   Mouth: Mucous membranes are moist.  ?Eyes:  ?   Conjunctiva/sclera: Conjunctivae normal.

## 2022-02-12 ENCOUNTER — Ambulatory Visit (INDEPENDENT_AMBULATORY_CARE_PROVIDER_SITE_OTHER): Payer: Medicare PPO | Admitting: Infectious Diseases

## 2022-02-12 ENCOUNTER — Other Ambulatory Visit: Payer: Self-pay

## 2022-02-12 VITALS — BP 108/73 | HR 93 | Temp 98.0°F

## 2022-02-12 DIAGNOSIS — R7881 Bacteremia: Secondary | ICD-10-CM

## 2022-02-12 DIAGNOSIS — Z5181 Encounter for therapeutic drug level monitoring: Secondary | ICD-10-CM

## 2022-02-12 DIAGNOSIS — B952 Enterococcus as the cause of diseases classified elsewhere: Secondary | ICD-10-CM

## 2022-02-12 NOTE — Progress Notes (Signed)
? ?  ? ?Patient Active Problem List  ? Diagnosis Date Noted  ? Pain due to onychomycosis of toenails of both feet 02/06/2022  ? Adult failure to thrive syndrome 01/22/2022  ? Tachycardia 01/22/2022  ? History of anemia due to chronic kidney disease 01/17/2022  ? Neurocognitive deficits 01/17/2022  ? Pressure injury of skin 01/11/2022  ? History of pulmonary embolism 01/10/2022  ? Hypoglycemia 01/09/2022  ? CKD (chronic kidney disease) stage 4, GFR 15-29 ml/min (HCC) 01/09/2022  ? Syncope 01/09/2022  ? Essential hypertension   ? Debility 12/04/2021  ? Anoxia of brain ([place]) 12/04/2021  ? Acute on chronic renal failure ([place]) 11/12/2021  ? Leukocytosis   ? Thrombocytopenia ([place])   ? Aspiration pneumonia of both lower lobes ([place])   ? Hemodialysis-associated hypotension   ? Acute respiratory failure (HCC) due to recurrent aspiration Pneumonia and mucous plugging/atelectasis    ? Acute saddle pulmonary embolism with acute cor pulmonale (HCC)   ? Protein-calorie malnutrition, severe 11/08/2021  ? Cardiac arrest ([place]) 10/28/2021  ? Adjustment disorder with depressed mood 04/10/2021  ? History of infectious disease 04/10/2021  ? Affective psychosis ([place]) 11/21/2020  ? Allergic rhinitis due to pollen 11/21/2020  ? Anxiety 11/21/2020  ? Chronic kidney disease, stage 2 (mild) 11/21/2020  ? Diabetic renal disease ([place]) 11/21/2020  ? Diverticular disease of colon 11/21/2020  ? Eczema 11/21/2020  ? Gastroesophageal reflux disease 11/21/2020  ? Gout 11/21/2020  ? Joint pain 11/21/2020  ? Lymphedema 11/21/2020  ? Mild intermittent asthma 11/21/2020  ? Moderate major depression, single episode ([place]) 11/21/2020  ? Neuropathy 11/21/2020  ? Type 2 diabetes mellitus with other specified complication ([place]) 21/30/8657  ? Pure hypercholesterolemia 11/21/2020  ? Vitamin D deficiency 11/21/2020  ? Fibromyalgia 09/04/2017  ? Insomnia 09/04/2017  ? DDD (degenerative disc disease), cervical 09/04/2017  ? DDD (degenerative disc disease), lumbar  09/04/2017  ? Primary osteoarthritis of both hands 09/04/2017  ? Primary osteoarthritis of both knees 09/04/2017  ? History of rotator cuff tear 09/04/2017  ? Osteopenia of multiple sites 09/04/2017  ? ? ?Patient's Medications  ?New Prescriptions  ? No medications on file  ?Previous Medications  ? ACETAMINOPHEN (TYLENOL) 325 MG TABLET    Take 2 tablets (650 mg total) by mouth every 4 (four) hours as needed for fever (greater than 37.6 C).  ? ALBUTEROL (VENTOLIN HFA) 108 (90 BASE) MCG/ACT INHALER    Inhale 1-2 puffs into the lungs every 4 (four) hours as needed for wheezing or shortness of breath.  ? APIXABAN (ELIQUIS) 5 MG TABS TABLET    Take 1 tablet (5 mg total) by mouth 2 (two) times daily.  ? ATORVASTATIN (LIPITOR) 20 MG TABLET    Take 1 tablet (20 mg total) by mouth daily.  ? B COMPLEX-C (B-COMPLEX WITH VITAMIN C) TABLET    Take 1 tablet by mouth daily.  ? DICYCLOMINE (BENTYL) 10 MG CAPSULE    Take 1 capsule (10 mg total) by mouth 3 (three) times daily before meals.  ? DULOXETINE (CYMBALTA) 20 MG CAPSULE    Take 1 capsule (20 mg total) by mouth daily.  ? FOLIC ACID (FOLVITE) 1 MG TABLET    Take 2 tablets (2 mg total) by mouth daily.  ? GLUCERNA (GLUCERNA) LIQD    Take 237 mLs by mouth daily.  ? LIDOCAINE (LIDODERM) 5 %    Place 2 patches onto the skin daily. Remove & Discard patch within 12 hours or as directed by MD  ? MELATONIN  5 MG TABS    Take 5 mg by mouth every evening.  ? METOPROLOL TARTRATE (LOPRESSOR) 25 MG TABLET    Take 0.5 tablets (12.5 mg total) by mouth 2 (two) times daily.  ? PANTOPRAZOLE (PROTONIX) 40 MG TABLET    Take 1 tablet (40 mg total) by mouth 2 (two) times daily.  ? PAROXETINE (PAXIL) 40 MG TABLET    Take 1 tablet (40 mg total) by mouth at bedtime.  ? VITAMIN D, ERGOCALCIFEROL, (DRISDOL) 1.25 MG (50000 UNIT) CAPS CAPSULE    Take 1 capsule (50,000 Units total) by mouth once a week.  ?Modified Medications  ? No medications on file  ?Discontinued Medications  ? No medications on file   ? ? ?Subjective: ?Here for HFU for E faecalis bacteremia. After last seen on 3/8, patient received IV ampicillin until 01/02/22. She was discharged on 3/13 but ended up getting re-admitted 2 days later on 3/15 for hypoglycemia and was discharged back to SNF on 3/22. Blood cultures on 3/15 2/2 sets negative.  Accompanied by her daughter today. She tells me she is doing well. Daughter says her dentures were removed at [facility] and she cannot eat. She has an upcoming appt with dentist for dental extraction. She will be discharged from SNF to home health with PT soon in a week or so. No complaints otherwise.  ? ?Review of Systems: ?ROS all systems reviewed with pertinent positives and negatives as listed above ? ?Past Medical History:  ?Diagnosis Date  ? Anxiety   ? Arthritis   ? Asthma   ? Cardiac arrest ([place]) 10/28/2021  ? Depression   ? Diabetes mellitus without complication ([place])   ? Fibromyalgia   ? GERD (gastroesophageal reflux disease)   ? Hyperlipemia   ? Hypertension   ? PONV (postoperative nausea and vomiting)   ? ?Past Surgical History:  ?Procedure Laterality Date  ? COLONOSCOPY    ? DILATION AND CURETTAGE OF UTERUS    ? IR EMBO ART  VEN HEMORR LYMPH EXTRAV  INC GUIDE ROADMAPPING  11/01/2021  ? IR FLUORO GUIDE CV LINE RIGHT  11/20/2021  ? IR REMOVAL TUN CV CATH W/O FL  12/20/2021  ? IR THROMBECT PRIM MECH INIT (INCLU) MOD SED  10/29/2021  ? IR US GUIDE VASC ACCESS RIGHT  10/29/2021  ? IR US GUIDE VASC ACCESS RIGHT  11/01/2021  ? IR US GUIDE VASC ACCESS RIGHT  11/21/2021  ? KNEE ARTHROSCOPY  [id number]  ? left  ? SHOULDER ARTHROSCOPY    ? left  ? TRIGGER FINGER RELEASE  08/13/2012  ? Procedure: RELEASE TRIGGER FINGER/A-1 PULLEY;  Surgeon: [name]., MD;  Location: [facility];  Service: Orthopedics;  Laterality: Right;  long finger  ? TUBAL LIGATION    ? ? ?Social History  ? ?Tobacco Use  ? Smoking status: Former  ?  Packs/day: 0.50  ?  Years: 20.00  ?  Pack years: 10.00  ?  Types: Cigarettes   ?  Quit date: 08/10/2010  ?  Years since quitting: 11.5  ? Smokeless tobacco: Never  ?Vaping Use  ? Vaping Use: Never used  ?Substance Use Topics  ? Alcohol use: No  ? Drug use: No  ? ? ?Family History  ?Problem Relation Age of Onset  ? Diabetes Mother   ? Heart attack Father   ? Diabetes Sister   ? Throat cancer Brother   ? Hypertension Daughter   ? ? ?Allergies  ?Allergen Reactions  ?  Aspirin Nausea And Vomiting  ?  Other reaction(s): Unknown  ? Benadryl [Diphenhydramine]   ?  Can only take dye free. States the dye causes itching.  ? Darvon [Propoxyphene] Itching  ?  Can only during the day. Night time makes her itch  ? Hydrocodone Bit-Homatrop Mbr Itching  ?  Pre medicate  ? Metformin   ?  Other reaction(s): GI  ? Other   ?  Other reaction(s): Unknown. States reaction was to nasal spray that caused pupils to shrink  ? Pentazocine   ?  nervous ?Other reaction(s): jitters  ? Percocet [Oxycodone-Acetaminophen]   ?  Itching; pre medicate  ? Sulfa Antibiotics Hives  ? Tetracaine Hcl   ?  Other reaction(s): Unknown. Thinks caused itching.  ? Tetracyclines & Related Hives  ? Tramadol   ?  Other reaction(s): itch  ? ? ?Health Maintenance  ?Topic Date Due  ? COVID-19 Vaccine (1) Never done  ? Pneumonia Vaccine 84+ Years old (1 - PCV) Never done  ? FOOT EXAM  Never done  ? OPHTHALMOLOGY EXAM  Never done  ? URINE MICROALBUMIN  Never done  ? Hepatitis C Screening  Never done  ? Zoster Vaccines- Shingrix (1 of 2) Never done  ? COLONOSCOPY (Pts 45-1yr Insurance coverage will need to be confirmed)  Never done  ? TETANUS/TDAP  02/25/2022  ? INFLUENZA VACCINE  05/28/2022  ? HEMOGLOBIN A1C  07/12/2022  ? MAMMOGRAM  09/08/2023  ? DEXA SCAN  Completed  ? HPV VACCINES  Aged Out  ? ? ?Objective: ?BP 108/73   Pulse 93   Temp 98 ?F (36.7 ?C) (Oral)   SpO2 97%  ? ? ?Physical Exam ?Constitutional:  in a wheelchair ?   Appearance: Normal appearance.  ?HENT:  ?   Head: Normocephalic and atraumatic.   ?   Mouth: Mucous membranes are  moist.  ?Eyes: ?   Conjunctiva/sclera: Conjunctivae normal.  ?   Pupils: Pupils are equal, round ? ?Cardiovascular:  ?   Rate and Rhythm: Normal rate and regular rhythm.  ?   Heart sounds: ? ?Pulmonary:  ?   [place]

## 2022-02-14 ENCOUNTER — Other Ambulatory Visit: Payer: Self-pay | Admitting: Adult Health

## 2022-02-14 ENCOUNTER — Encounter: Payer: Self-pay | Admitting: Adult Health

## 2022-02-14 ENCOUNTER — Non-Acute Institutional Stay (SKILLED_NURSING_FACILITY): Payer: Medicare PPO | Admitting: Adult Health

## 2022-02-14 DIAGNOSIS — N184 Chronic kidney disease, stage 4 (severe): Secondary | ICD-10-CM | POA: Diagnosis not present

## 2022-02-14 DIAGNOSIS — R000 Tachycardia, unspecified: Secondary | ICD-10-CM

## 2022-02-14 DIAGNOSIS — I2782 Chronic pulmonary embolism: Secondary | ICD-10-CM

## 2022-02-14 DIAGNOSIS — E7800 Pure hypercholesterolemia, unspecified: Secondary | ICD-10-CM | POA: Diagnosis not present

## 2022-02-14 DIAGNOSIS — E162 Hypoglycemia, unspecified: Secondary | ICD-10-CM

## 2022-02-14 DIAGNOSIS — F419 Anxiety disorder, unspecified: Secondary | ICD-10-CM | POA: Diagnosis not present

## 2022-02-14 DIAGNOSIS — M797 Fibromyalgia: Secondary | ICD-10-CM

## 2022-02-14 DIAGNOSIS — F5101 Primary insomnia: Secondary | ICD-10-CM

## 2022-02-14 DIAGNOSIS — K589 Irritable bowel syndrome without diarrhea: Secondary | ICD-10-CM | POA: Diagnosis not present

## 2022-02-14 DIAGNOSIS — K219 Gastro-esophageal reflux disease without esophagitis: Secondary | ICD-10-CM | POA: Diagnosis not present

## 2022-02-14 DIAGNOSIS — E1122 Type 2 diabetes mellitus with diabetic chronic kidney disease: Secondary | ICD-10-CM

## 2022-02-14 MED ORDER — PAROXETINE HCL 40 MG PO TABS: 40.0000 mg | ORAL_TABLET | Freq: Every day | ORAL | 0 refills | Status: DC

## 2022-02-14 MED ORDER — DULOXETINE HCL 20 MG PO CPEP: 20.0000 mg | ORAL_CAPSULE | Freq: Every day | ORAL | 0 refills | Status: DC

## 2022-02-14 MED ORDER — ALBUTEROL SULFATE HFA 108 (90 BASE) MCG/ACT IN AERS: 1.0000 | INHALATION_SPRAY | RESPIRATORY_TRACT | 0 refills | Status: DC | PRN

## 2022-02-14 MED ORDER — METOPROLOL TARTRATE 25 MG PO TABS: 12.5000 mg | ORAL_TABLET | Freq: Two times a day (BID) | ORAL | 0 refills | Status: DC

## 2022-02-14 MED ORDER — MELATONIN 5 MG PO TABS: 5.0000 mg | ORAL_TABLET | Freq: Every evening | ORAL | 0 refills | Status: DC

## 2022-02-14 MED ORDER — DICYCLOMINE HCL 10 MG PO CAPS: 10.0000 mg | ORAL_CAPSULE | Freq: Three times a day (TID) | ORAL | 0 refills | Status: AC

## 2022-02-14 MED ORDER — APIXABAN 5 MG PO TABS: 5.0000 mg | ORAL_TABLET | Freq: Two times a day (BID) | ORAL | 0 refills | Status: DC

## 2022-02-14 MED ORDER — LIDOCAINE 5 % EX PTCH
2.0000 | MEDICATED_PATCH | CUTANEOUS | 0 refills | Status: AC
Start: 2022-02-14 — End: ?

## 2022-02-14 MED ORDER — PANTOPRAZOLE SODIUM 40 MG PO TBEC
40.0000 mg | DELAYED_RELEASE_TABLET | Freq: Two times a day (BID) | ORAL | 0 refills | Status: AC
Start: 2022-02-14 — End: ?

## 2022-02-14 MED ORDER — ATORVASTATIN CALCIUM 20 MG PO TABS: 20.0000 mg | ORAL_TABLET | Freq: Every day | ORAL | 0 refills | Status: AC

## 2022-02-14 MED ORDER — FOLIC ACID 1 MG PO TABS: 2.0000 mg | ORAL_TABLET | Freq: Every day | ORAL | 0 refills | Status: DC

## 2022-02-14 NOTE — Progress Notes (Signed)
? ?Location:  [facility] ?Nursing Home Room Number: 027X ?Place of Service:  SNF (31) ?Provider:  Durenda Age, DNP, FNP-BC ? ?Patient Care Team: ?[name], MD as PCP - General (Family Medicine) ? ?Extended Emergency Contact Information ?Primary Emergency Contact: [name] ?Address: [address] ?         [place], [place] [address] [place] ?Mobile Phone: [phone] ?Relation: Daughter ? ?Code Status:  DNR ? ?Goals of care: Advanced Directive information ? ?  02/14/2022  ? 10:16 AM  ?Advanced Directives  ?Does Patient Have a Medical Advance Directive? Yes  ?Type of Advance Directive Out of facility DNR (pink MOST or yellow form)  ?Pre-existing out of facility DNR order (yellow form or pink MOST form) Yellow form placed in chart (order not valid for inpatient use)  ? ? ? ?Chief Complaint  ?Patient presents with  ? Discharge Note  ?  For discharge home on 02/14/22 with Home health PT and OT  ? ? ?HPI:  ?Pt is a 71 y.o. female who is for discharge home today, 02/14/2022, with home health PT and OT. ? ?She was admitted to [facility] on 01/16/2022 post hospital admission 01/09/2022 to 01/16/2022.  She has a PMH of GERD, anxiety, hyperlipidemia, hypertension, diabetes mellitus, chronic kidney disease stage IV and recent pulmonary embolism.  She presented to the hospital via EMS for altered mental status/confusion/syncope and was found to have a severe hypoglycemia (blood sugar 42).  D10 IV fluids given with improvement of symptoms.  EEG showed no seizure or epileptiform discharges.  MRI brain unremarkable. ? ?At [facility], stay was complicated by fibromyalgia for which she was started on duloxetine 20 mg daily.  She had tachycardia for which amlodipine was discontinued and started on metoprolol tartrate 12.5 mg twice a day.  Tachycardia was then resolved. ? ?Patient was admitted to this facility for short-term rehabilitation after the patient's  recent hospitalization.  Patient has completed SNF rehabilitation and therapy has cleared the patient for discharge. ? ? ?Past Medical History:  ?Diagnosis Date  ? Anxiety   ? Arthritis   ? Asthma   ? Cardiac arrest ([place]) 10/28/2021  ? Depression   ? Diabetes mellitus without complication ([place])   ? Fibromyalgia   ? GERD (gastroesophageal reflux disease)   ? Hyperlipemia   ? Hypertension   ? PONV (postoperative nausea and vomiting)   ? ?Past Surgical History:  ?Procedure Laterality Date  ? COLONOSCOPY    ? DILATION AND CURETTAGE OF UTERUS    ? IR EMBO ART  VEN HEMORR LYMPH EXTRAV  INC GUIDE ROADMAPPING  11/01/2021  ? IR FLUORO GUIDE CV LINE RIGHT  11/20/2021  ? IR REMOVAL TUN CV CATH W/O FL  12/20/2021  ? IR THROMBECT PRIM MECH INIT (INCLU) MOD SED  10/29/2021  ? IR US GUIDE VASC ACCESS RIGHT  10/29/2021  ? IR US GUIDE VASC ACCESS RIGHT  11/01/2021  ? IR US GUIDE VASC ACCESS RIGHT  11/21/2021  ? KNEE ARTHROSCOPY  [id number]  ? left  ? SHOULDER ARTHROSCOPY    ? left  ? TRIGGER FINGER RELEASE  08/13/2012  ? Procedure: RELEASE TRIGGER FINGER/A-1 PULLEY;  Surgeon: [name]., MD;  Location: [facility];  Service: Orthopedics;  Laterality: Right;  long finger  ? TUBAL LIGATION    ? ? ?Allergies  ?Allergen Reactions  ? Aspirin Nausea And Vomiting  ?  Other reaction(s): Unknown  ? Benadryl [Diphenhydramine]   ?  Can only take dye free. States the dye causes itching.  ? Darvon [Propoxyphene] Itching  ?  Can only during the day. Night time makes her itch  ? Hydrocodone Bit-Homatrop Mbr Itching  ?  Pre medicate  ? Metformin   ?  Other reaction(s): GI  ? Other   ?  Other reaction(s): Unknown. States reaction was to nasal spray that caused pupils to shrink  ? Pentazocine   ?  nervous ?Other reaction(s): jitters  ? Percocet [Oxycodone-Acetaminophen]   ?  Itching; pre medicate  ? Sulfa Antibiotics Hives  ? Tetracaine Hcl   ?  Other reaction(s): Unknown. Thinks caused itching.  ? Tetracyclines & Related Hives  ? Tramadol    ?  Other reaction(s): itch  ? ? ?Outpatient Encounter Medications as of 02/14/2022  ?Medication Sig  ? acetaminophen (TYLENOL) 325 MG tablet Take 2 tablets (650 mg total) by mouth every 4 (four) hours as needed for fever (greater than 37.6 C).  ? albuterol (VENTOLIN HFA) 108 (90 Base) MCG/ACT inhaler Inhale 1-2 puffs into the lungs every 4 (four) hours as needed for wheezing or shortness of breath.  ? apixaban (ELIQUIS) 5 MG TABS tablet Take 1 tablet (5 mg total) by mouth 2 (two) times daily.  ? atorvastatin (LIPITOR) 20 MG tablet Take 1 tablet (20 mg total) by mouth daily.  ? B Complex-C (B-COMPLEX WITH VITAMIN C) tablet Take 1 tablet by mouth daily.  ? dicyclomine (BENTYL) 10 MG capsule Take 1 capsule (10 mg total) by mouth 3 (three) times daily before meals.  ? DULoxetine (CYMBALTA) 20 MG capsule Take 1 capsule (20 mg total) by mouth daily.  ? folic acid (FOLVITE) 1 MG tablet Take 2 tablets (2 mg total) by mouth daily.  ? Glucerna (GLUCERNA) LIQD Take 237 mLs by mouth daily.  ? lidocaine (LIDODERM) 5 % Place 2 patches onto the skin daily. Remove & Discard patch within 12 hours or as directed by MD  ? melatonin 5 MG TABS Take 5 mg by mouth every evening.  ? metoprolol tartrate (LOPRESSOR) 25 MG tablet Take 0.5 tablets (12.5 mg total) by mouth 2 (two) times daily.  ? pantoprazole (PROTONIX) 40 MG tablet Take 1 tablet (40 mg total) by mouth 2 (two) times daily.  ? PARoxetine (PAXIL) 40 MG tablet Take 1 tablet (40 mg total) by mouth at bedtime.  ? Vitamin D, Ergocalciferol, (DRISDOL) 1.25 MG (50000 UNIT) CAPS capsule Take 1 capsule (50,000 Units total) by mouth once a week.  ? ?No facility-administered encounter medications on file as of 02/14/2022.  ? ? ?Review of Systems  ?Constitutional:  Negative for appetite change, chills, fatigue and fever.  ?HENT:  Negative for congestion, hearing loss, rhinorrhea and sore throat.   ?Eyes: Negative.   ?Respiratory:  Negative for cough, shortness of breath and wheezing.    ?Cardiovascular:  Negative for chest pain, palpitations and leg swelling.  ?Gastrointestinal:  Negative for abdominal pain, constipation, diarrhea, nausea and vomiting.  ?Genitourinary:  Negative for dysuria.  ?Musculoskeletal:  Negative for arthralgias, back pain and myalgias.  ?Skin:  Negative for color change, rash and wound.  ?Neurological:  Negative for dizziness, weakness and headaches.  ?Psychiatric/Behavioral:  Negative for behavioral problems. The patient is not nervous/anxious.    ? ? ? ?Immunization History  ?Administered Date(s) Administered  ? Tdap 02/26/2012  ? ?Pertinent  Health Maintenance Due  ?Topic Date Due  ? FOOT EXAM  Never done  ? OPHTHALMOLOGY EXAM  Never done  ? URINE  MICROALBUMIN  Never done  ? COLONOSCOPY (Pts 45-54yr Insurance coverage will need to be confirmed)  Never done  ? INFLUENZA VACCINE  05/28/2022  ? HEMOGLOBIN A1C  07/12/2022  ? MAMMOGRAM  09/08/2023  ? DEXA SCAN  Completed  ? ? ?  01/14/2022  ?  8:00 AM 01/14/2022  ?  9:00 PM 01/15/2022  ?  8:00 AM 01/15/2022  ?  9:00 PM 01/16/2022  ?  1:00 PM  ?Fall Risk  ?Patient Fall Risk Level High fall risk High fall risk High fall risk High fall risk High fall risk  ? ? ? ?Vitals:  ? 02/14/22 1014  ?BP: 103/61  ?Pulse: 80  ?Resp: 19  ?Temp: 97.7 ?F (36.5 ?C)  ?Weight: 126 lb 6.4 oz (57.3 kg)  ?Height: '5\' 5"'$  (1.651 m)  ? ?Body mass index is 21.03 kg/m?. ? ?Physical Exam ?Constitutional:   ?   General: She is not in acute distress. ?   Appearance: Normal appearance.  ?HENT:  ?   Head: Normocephalic and atraumatic.  ?   Nose: Nose normal.  ?   Mouth/Throat:  ?   Mouth: Mucous membranes are moist.  ?Eyes:  ?   Conjunctiva/sclera: Conjunctivae normal.  ?Cardiovascular:  ?   Rate and Rhythm: Normal rate and regular rhythm.  ?Pulmonary:  ?   Effort: Pulmonary effort is normal.  ?   Breath sounds: Normal breath sounds.  ?Abdominal:  ?   General: Bowel sounds are normal.  ?   Palpations: Abdomen is soft.  ?Musculoskeletal:     ?   General: Normal  range of motion.  ?   Cervical back: Normal range of motion.  ?Skin: ?   General: Skin is warm and dry.  ?Neurological:  ?   Mental Status: She is alert. Mental status is at baseline. She is disoriented.  ?   Com

## 2022-02-15 DIAGNOSIS — B952 Enterococcus as the cause of diseases classified elsewhere: Secondary | ICD-10-CM | POA: Insufficient documentation

## 2022-02-15 DIAGNOSIS — Z5181 Encounter for therapeutic drug level monitoring: Secondary | ICD-10-CM | POA: Insufficient documentation

## 2022-02-15 DIAGNOSIS — Z7189 Other specified counseling: Secondary | ICD-10-CM | POA: Insufficient documentation

## 2022-02-16 DIAGNOSIS — M21371 Foot drop, right foot: Secondary | ICD-10-CM | POA: Diagnosis not present

## 2022-02-16 DIAGNOSIS — N184 Chronic kidney disease, stage 4 (severe): Secondary | ICD-10-CM | POA: Diagnosis not present

## 2022-02-16 DIAGNOSIS — I4901 Ventricular fibrillation: Secondary | ICD-10-CM | POA: Diagnosis not present

## 2022-02-16 DIAGNOSIS — I2782 Chronic pulmonary embolism: Secondary | ICD-10-CM | POA: Diagnosis not present

## 2022-02-16 DIAGNOSIS — E1122 Type 2 diabetes mellitus with diabetic chronic kidney disease: Secondary | ICD-10-CM | POA: Diagnosis not present

## 2022-02-16 DIAGNOSIS — D62 Acute posthemorrhagic anemia: Secondary | ICD-10-CM | POA: Diagnosis not present

## 2022-02-16 DIAGNOSIS — D631 Anemia in chronic kidney disease: Secondary | ICD-10-CM | POA: Diagnosis not present

## 2022-02-16 DIAGNOSIS — I129 Hypertensive chronic kidney disease with stage 1 through stage 4 chronic kidney disease, or unspecified chronic kidney disease: Secondary | ICD-10-CM | POA: Diagnosis not present

## 2022-02-16 DIAGNOSIS — J45909 Unspecified asthma, uncomplicated: Secondary | ICD-10-CM | POA: Diagnosis not present

## 2022-02-18 ENCOUNTER — Encounter: Payer: Self-pay | Admitting: Adult Health

## 2022-02-18 ENCOUNTER — Other Ambulatory Visit: Payer: Self-pay

## 2022-02-18 ENCOUNTER — Encounter (HOSPITAL_COMMUNITY): Payer: Self-pay | Admitting: Emergency Medicine

## 2022-02-18 ENCOUNTER — Inpatient Hospital Stay (HOSPITAL_COMMUNITY)
Admission: EM | Admit: 2022-02-18 | Discharge: 2022-02-23 | DRG: 378 | Disposition: A | Discharge status: Home Health | Payer: Medicare PPO | Attending: Internal Medicine | Admitting: Internal Medicine

## 2022-02-18 DIAGNOSIS — Z833 Family history of diabetes mellitus: Secondary | ICD-10-CM

## 2022-02-18 DIAGNOSIS — Z808 Family history of malignant neoplasm of other organs or systems: Secondary | ICD-10-CM

## 2022-02-18 DIAGNOSIS — R000 Tachycardia, unspecified: Secondary | ICD-10-CM | POA: Diagnosis not present

## 2022-02-18 DIAGNOSIS — Z79899 Other long term (current) drug therapy: Secondary | ICD-10-CM

## 2022-02-18 DIAGNOSIS — Z882 Allergy status to sulfonamides status: Secondary | ICD-10-CM

## 2022-02-18 DIAGNOSIS — I129 Hypertensive chronic kidney disease with stage 1 through stage 4 chronic kidney disease, or unspecified chronic kidney disease: Secondary | ICD-10-CM | POA: Diagnosis present

## 2022-02-18 DIAGNOSIS — I2602 Saddle embolus of pulmonary artery with acute cor pulmonale: Secondary | ICD-10-CM | POA: Diagnosis not present

## 2022-02-18 DIAGNOSIS — N184 Chronic kidney disease, stage 4 (severe): Secondary | ICD-10-CM | POA: Diagnosis present

## 2022-02-18 DIAGNOSIS — F32A Depression, unspecified: Secondary | ICD-10-CM | POA: Diagnosis present

## 2022-02-18 DIAGNOSIS — K635 Polyp of colon: Secondary | ICD-10-CM | POA: Diagnosis present

## 2022-02-18 DIAGNOSIS — K922 Gastrointestinal hemorrhage, unspecified: Principal | ICD-10-CM

## 2022-02-18 DIAGNOSIS — E119 Type 2 diabetes mellitus without complications: Secondary | ICD-10-CM | POA: Diagnosis present

## 2022-02-18 DIAGNOSIS — Z8249 Family history of ischemic heart disease and other diseases of the circulatory system: Secondary | ICD-10-CM | POA: Diagnosis not present

## 2022-02-18 DIAGNOSIS — K219 Gastro-esophageal reflux disease without esophagitis: Secondary | ICD-10-CM | POA: Diagnosis present

## 2022-02-18 DIAGNOSIS — K648 Other hemorrhoids: Secondary | ICD-10-CM | POA: Diagnosis present

## 2022-02-18 DIAGNOSIS — M797 Fibromyalgia: Secondary | ICD-10-CM | POA: Diagnosis present

## 2022-02-18 DIAGNOSIS — D638 Anemia in other chronic diseases classified elsewhere: Secondary | ICD-10-CM | POA: Diagnosis present

## 2022-02-18 DIAGNOSIS — R531 Weakness: Secondary | ICD-10-CM | POA: Diagnosis not present

## 2022-02-18 DIAGNOSIS — I445 Left posterior fascicular block: Secondary | ICD-10-CM | POA: Diagnosis present

## 2022-02-18 DIAGNOSIS — Z885 Allergy status to narcotic agent status: Secondary | ICD-10-CM

## 2022-02-18 DIAGNOSIS — Z87891 Personal history of nicotine dependence: Secondary | ICD-10-CM

## 2022-02-18 DIAGNOSIS — K644 Residual hemorrhoidal skin tags: Secondary | ICD-10-CM | POA: Diagnosis present

## 2022-02-18 DIAGNOSIS — F419 Anxiety disorder, unspecified: Secondary | ICD-10-CM | POA: Diagnosis present

## 2022-02-18 DIAGNOSIS — M1990 Unspecified osteoarthritis, unspecified site: Secondary | ICD-10-CM | POA: Diagnosis present

## 2022-02-18 DIAGNOSIS — I10 Essential (primary) hypertension: Secondary | ICD-10-CM | POA: Diagnosis not present

## 2022-02-18 DIAGNOSIS — E1122 Type 2 diabetes mellitus with diabetic chronic kidney disease: Secondary | ICD-10-CM | POA: Diagnosis present

## 2022-02-18 DIAGNOSIS — D124 Benign neoplasm of descending colon: Secondary | ICD-10-CM | POA: Diagnosis not present

## 2022-02-18 DIAGNOSIS — D62 Acute posthemorrhagic anemia: Secondary | ICD-10-CM | POA: Diagnosis present

## 2022-02-18 DIAGNOSIS — Z86711 Personal history of pulmonary embolism: Secondary | ICD-10-CM | POA: Diagnosis not present

## 2022-02-18 DIAGNOSIS — E876 Hypokalemia: Secondary | ICD-10-CM | POA: Diagnosis not present

## 2022-02-18 DIAGNOSIS — J45909 Unspecified asthma, uncomplicated: Secondary | ICD-10-CM | POA: Diagnosis present

## 2022-02-18 DIAGNOSIS — K5730 Diverticulosis of large intestine without perforation or abscess without bleeding: Secondary | ICD-10-CM | POA: Diagnosis present

## 2022-02-18 DIAGNOSIS — Z7401 Bed confinement status: Secondary | ICD-10-CM | POA: Diagnosis not present

## 2022-02-18 DIAGNOSIS — K921 Melena: Principal | ICD-10-CM | POA: Diagnosis present

## 2022-02-18 DIAGNOSIS — Z886 Allergy status to analgesic agent status: Secondary | ICD-10-CM

## 2022-02-18 DIAGNOSIS — Z888 Allergy status to other drugs, medicaments and biological substances status: Secondary | ICD-10-CM

## 2022-02-18 DIAGNOSIS — Z7901 Long term (current) use of anticoagulants: Secondary | ICD-10-CM

## 2022-02-18 DIAGNOSIS — Z8674 Personal history of sudden cardiac arrest: Secondary | ICD-10-CM

## 2022-02-18 DIAGNOSIS — K625 Hemorrhage of anus and rectum: Secondary | ICD-10-CM | POA: Diagnosis not present

## 2022-02-18 DIAGNOSIS — D649 Anemia, unspecified: Secondary | ICD-10-CM | POA: Diagnosis not present

## 2022-02-18 DIAGNOSIS — G8929 Other chronic pain: Secondary | ICD-10-CM | POA: Diagnosis present

## 2022-02-18 DIAGNOSIS — Z794 Long term (current) use of insulin: Secondary | ICD-10-CM | POA: Diagnosis not present

## 2022-02-18 DIAGNOSIS — K3189 Other diseases of stomach and duodenum: Secondary | ICD-10-CM | POA: Diagnosis not present

## 2022-02-18 DIAGNOSIS — Z881 Allergy status to other antibiotic agents status: Secondary | ICD-10-CM

## 2022-02-18 DIAGNOSIS — E1169 Type 2 diabetes mellitus with other specified complication: Secondary | ICD-10-CM | POA: Diagnosis present

## 2022-02-18 DIAGNOSIS — E785 Hyperlipidemia, unspecified: Secondary | ICD-10-CM | POA: Diagnosis present

## 2022-02-18 DIAGNOSIS — R58 Hemorrhage, not elsewhere classified: Secondary | ICD-10-CM | POA: Diagnosis not present

## 2022-02-18 DIAGNOSIS — M2550 Pain in unspecified joint: Secondary | ICD-10-CM | POA: Diagnosis not present

## 2022-02-18 LAB — CBG MONITORING, ED: Glucose-Capillary: 132 mg/dL — ABNORMAL HIGH (ref 70–99)

## 2022-02-18 LAB — COMPREHENSIVE METABOLIC PANEL
ALT: 12 U/L (ref 0–44)
AST: 21 U/L (ref 15–41)
Albumin: 3.3 g/dL — ABNORMAL LOW (ref 3.5–5.0)
Alkaline Phosphatase: 58 U/L (ref 38–126)
Anion gap: 12 (ref 5–15)
BUN: 17 mg/dL (ref 8–23)
CO2: 23 mmol/L (ref 22–32)
Calcium: 9.7 mg/dL (ref 8.9–10.3)
Chloride: 106 mmol/L (ref 98–111)
Creatinine, Ser: 1.93 mg/dL — ABNORMAL HIGH (ref 0.44–1.00)
GFR, Estimated: 27 mL/min — ABNORMAL LOW (ref >60)
Glucose, Bld: 165 mg/dL — ABNORMAL HIGH (ref 70–99)
Potassium: 5.1 mmol/L (ref 3.5–5.1)
Sodium: 141 mmol/L (ref 135–145)
Total Bilirubin: 0.7 mg/dL (ref 0.3–1.2)
Total Protein: 6.7 g/dL (ref 6.5–8.1)

## 2022-02-18 LAB — TYPE AND SCREEN
ABO/RH(D): A POS
Antibody Screen: NEGATIVE

## 2022-02-18 LAB — CBC WITH DIFFERENTIAL/PLATELET
Abs Immature Granulocytes: 0.02 10*3/uL (ref 0.00–0.07)
Basophils Absolute: 0.1 10*3/uL (ref 0.0–0.1)
Basophils Relative: 1 %
Eosinophils Absolute: 0.1 10*3/uL (ref 0.0–0.5)
Eosinophils Relative: 1 %
HCT: 33.9 % — ABNORMAL LOW (ref 36.0–46.0)
Hemoglobin: 10.8 g/dL — ABNORMAL LOW (ref 12.0–15.0)
Immature Granulocytes: 0 %
Lymphocytes Relative: 30 %
Lymphs Abs: 2 10*3/uL (ref 0.7–4.0)
MCH: 27.7 pg (ref 26.0–34.0)
MCHC: 31.9 g/dL (ref 30.0–36.0)
MCV: 86.9 fL (ref 80.0–100.0)
Monocytes Absolute: 0.5 10*3/uL (ref 0.1–1.0)
Monocytes Relative: 7 %
Neutro Abs: 4 10*3/uL (ref 1.7–7.7)
Neutrophils Relative %: 61 %
Platelets: 364 10*3/uL (ref 150–400)
RBC: 3.9 MIL/uL (ref 3.87–5.11)
RDW: 12.9 % (ref 11.5–15.5)
WBC: 6.6 10*3/uL (ref 4.0–10.5)
nRBC: 0 % (ref 0.0–0.2)

## 2022-02-18 MED ORDER — LACTATED RINGERS IV SOLN: INTRAVENOUS | Status: DC

## 2022-02-18 MED ORDER — SODIUM CHLORIDE 0.9 % IV BOLUS
500.0000 mL | Freq: Once | INTRAVENOUS | Status: AC
  Administered 2022-02-19: 500 mL via INTRAVENOUS

## 2022-02-18 NOTE — ED Provider Triage Note (Signed)
Emergency Medicine Provider Triage Evaluation Note ? ?[name] , a 71 y.o. female  was evaluated in triage.  Pt complains of blood in stool.  Patient had 2 episodes of blood in stool this morning.  Patient states that blood was bright to dark red in color.  Patient noticed blood feeling her toilet bowl on as well as after wiping.  Patient reports that she had been constipated over the last 3 days and this is the first time she had a bowel movement.  Patient endorses known history of hemorrhoids.  Patient reports that she is on Eliquis and took her last dose this morning. ? ?Patient denies any abdominal pain, nausea, vomiting, diarrhea, melena, lightheadedness, dizziness, syncope, chest pain, shortness of breath, fatigue. ? ?Review of Systems  ?Positive: Blood in stool ?Negative: See above ? ?Physical Exam  ?BP (!) 104/56 (BP Location: Left Arm)   Pulse 92   Temp 98.1 ?F (36.7 ?C)   Resp 16   SpO2 99%  ?Gen:   Awake, no distress   ?Resp:  Normal effort  ?MSK:   Moves extremities without difficulty  ?Other:  Abdomen soft, nondistended, nontender with no guarding or rebound tenderness. ? ?Medical Decision Making  ?Medically screening exam initiated at 2:52 PM.  Appropriate orders placed.  SATOMI BUDA was informed that the remainder of the evaluation will be completed by another provider, this initial triage assessment does not replace that evaluation, and the importance of remaining in the ED until their evaluation is complete. ? ? ?  ?Loni Beckwith, PA-C ?02/18/22 1454 ? ?

## 2022-02-18 NOTE — H&P (Addendum)
?History and Physical ? ?[name] WUJ:[id number] DOB: 09-May-1951 DOA: 02/18/2022 ? ?Referring physician: Dr. Sedonia Small, [place]  ?PCP: [name], MD  ?Outpatient Specialists: Infectious disease, cardiology, pulmonology, rheumatology. ?Patient coming from: Home ? ?Chief Complaint: Hematochezia. ? ?HPI: [name] is a 71 y.o. female with medical history significant for chronic anxiety/depression, arthritis, fibromyalgia, asthma, type 2 diabetes, GERD, hyperlipidemia, hypertension, recent history of V-fib arrest secondary to saddle pulmonary embolism on Eliquis, recent admission for hypoglycemia who presented today from home via EMS due to painless hematochezia with 2 large volume bloody stools today.  Not associated with abdominal pain or tenderness.  No painful defecation.  No subjective fevers or chills.  She was brought into the ED for further evaluation.  Work-up in the ED revealed hemoglobin stable at 10.8K, at baseline.  Hyperkalemia potassium 5.1, creatinine 1.93 with GFR 27, at baseline.  Glucose 165.  No recurrent GI bleed in the ED.  Due to concern for lower GI bleed, possible diverticular bleed, EDP requested admission.  Patient admitted by hospitalist service, [facility]. ? ?ED Course: Tmax 98.7.  BP 115/77, pulse 103, respiratory 16, saturation 100% on room air.  Pertinent labs as stated above in the HPI. ? ?Review of Systems: ?Review of systems as noted in the HPI. All other systems reviewed and are negative. ? ? ?Past Medical History:  ?Diagnosis Date  ? Anxiety   ? Arthritis   ? Asthma   ? Cardiac arrest ([place]) 10/28/2021  ? Depression   ? Diabetes mellitus without complication ([place])   ? Fibromyalgia   ? GERD (gastroesophageal reflux disease)   ? Hyperlipemia   ? Hypertension   ? PONV (postoperative nausea and vomiting)   ? ?Past Surgical History:  ?Procedure Laterality Date  ? COLONOSCOPY    ? DILATION AND CURETTAGE OF UTERUS    ? IR EMBO ART  VEN HEMORR LYMPH EXTRAV  INC GUIDE ROADMAPPING  11/01/2021   ? IR FLUORO GUIDE CV LINE RIGHT  11/20/2021  ? IR REMOVAL TUN CV CATH W/O FL  12/20/2021  ? IR THROMBECT PRIM MECH INIT (INCLU) MOD SED  10/29/2021  ? IR US GUIDE VASC ACCESS RIGHT  10/29/2021  ? IR US GUIDE VASC ACCESS RIGHT  11/01/2021  ? IR US GUIDE VASC ACCESS RIGHT  11/21/2021  ? KNEE ARTHROSCOPY  [id number]  ? left  ? SHOULDER ARTHROSCOPY    ? left  ? TRIGGER FINGER RELEASE  08/13/2012  ? Procedure: RELEASE TRIGGER FINGER/A-1 PULLEY;  Surgeon: [name]., MD;  Location: [facility];  Service: Orthopedics;  Laterality: Right;  long finger  ? TUBAL LIGATION    ? ? ?Social History:  reports that she quit smoking about 11 years ago. Her smoking use included cigarettes. She has a 10.00 pack-year smoking history. She has never used smokeless tobacco. She reports that she does not drink alcohol and does not use drugs. ? ? ?Allergies  ?Allergen Reactions  ? Aspirin Nausea And Vomiting  ?  Other reaction(s): Unknown  ? Benadryl [Diphenhydramine]   ?  Can only take dye free. States the dye causes itching.  ? Darvon [Propoxyphene] Itching  ?  Can only during the day. Night time makes her itch  ? Hydrocodone Bit-[place] Mbr Itching  ?  Pre medicate  ? Metformin   ?  Other reaction(s): GI  ? Other   ?  Other reaction(s): Unknown. States reaction was to nasal spray that caused pupils to shrink  ? Pentazocine   ?  nervous ?Other reaction(s): jitters  ? Percocet [Oxycodone-Acetaminophen]   ?  Itching; pre medicate  ? Sulfa Antibiotics Hives  ? Tetracaine Hcl   ?  Other reaction(s): Unknown. Thinks caused itching.  ? Tetracyclines & Related Hives  ? Tramadol   ?  Other reaction(s): itch  ? ? ?Family History  ?Problem Relation Age of Onset  ? Diabetes Mother   ? Heart attack Father   ? Diabetes Sister   ? Throat cancer Brother   ? Hypertension Daughter   ?  ? ? ?Prior to Admission medications   ?Medication Sig Start Date End Date Taking? Authorizing Provider  ?acetaminophen (TYLENOL) 325 MG tablet Take 2 tablets  (650 mg total) by mouth every 4 (four) hours as needed for fever (greater than 37.6 C). 01/07/22   Angiulli, Lavon Paganini, PA-C  ?albuterol (VENTOLIN HFA) 108 (90 Base) MCG/ACT inhaler Inhale 1-2 puffs into the lungs every 4 (four) hours as needed for wheezing or shortness of breath. 02/14/22   Medina-Vargas, Monina C, NP  ?apixaban (ELIQUIS) 5 MG TABS tablet Take 1 tablet (5 mg total) by mouth 2 (two) times daily. 02/14/22   Medina-Vargas, Monina C, NP  ?atorvastatin (LIPITOR) 20 MG tablet Take 1 tablet (20 mg total) by mouth daily. 02/14/22   Medina-Vargas, Senaida Lange, NP  ?B Complex-C (B-COMPLEX WITH VITAMIN C) tablet Take 1 tablet by mouth daily. 01/08/22   Angiulli, Lavon Paganini, PA-C  ?dicyclomine (BENTYL) 10 MG capsule Take 1 capsule (10 mg total) by mouth 3 (three) times daily before meals. 02/14/22   Medina-Vargas, Monina C, NP  ?DULoxetine (CYMBALTA) 20 MG capsule Take 1 capsule (20 mg total) by mouth daily. 02/14/22   Medina-Vargas, Monina C, NP  ?folic acid (FOLVITE) 1 MG tablet Take 2 tablets (2 mg total) by mouth daily. 02/14/22   Medina-Vargas, Senaida Lange, NP  ?Glucerna (GLUCERNA) LIQD Take 237 mLs by mouth daily.    [provider]  ?lidocaine (LIDODERM) 5 % Place 2 patches onto the skin daily. Remove & Discard patch within 12 hours or as directed by MD 02/14/22   Medina-Vargas, Monina C, NP  ?melatonin 5 MG TABS Take 1 tablet (5 mg total) by mouth every evening. 02/14/22   Medina-Vargas, Monina C, NP  ?metoprolol tartrate (LOPRESSOR) 25 MG tablet Take 0.5 tablets (12.5 mg total) by mouth 2 (two) times daily. 02/14/22   Medina-Vargas, Monina C, NP  ?pantoprazole (PROTONIX) 40 MG tablet Take 1 tablet (40 mg total) by mouth 2 (two) times daily. 02/14/22   Medina-Vargas, Senaida Lange, NP  ?PARoxetine (PAXIL) 40 MG tablet Take 1 tablet (40 mg total) by mouth at bedtime. 02/14/22   Medina-Vargas, Senaida Lange, NP  ?Vitamin D, Ergocalciferol, (DRISDOL) 1.25 MG (50000 UNIT) CAPS capsule Take 1 capsule (50,000 Units total) by mouth  once a week. 01/07/22   Angiulli, Lavon Paganini, PA-C  ? ? ?Physical Exam: ?BP 115/77   Pulse (!) 103   Temp 98.7 ?F (37.1 ?C) (Oral)   Resp 16   SpO2 100%  ? ?General: 71 y.o. year-old female well developed well nourished in no acute distress.  Alert and oriented x3. ?Cardiovascular: Regular rate and rhythm with no rubs or gallops.  No thyromegaly or JVD noted.  No lower extremity edema. 2/4 pulses in all 4 extremities. ?Respiratory: Clear to auscultation with no wheezes or rales. Good inspiratory effort. ?Abdomen: Soft nontender nondistended with normal bowel sounds x4 quadrants. ?Muskuloskeletal: No cyanosis, clubbing or edema noted bilaterally ?Neuro: CN II-XII intact, strength,  sensation, reflexes ?Skin: No ulcerative lesions noted or rashes ?Psychiatry: Judgement and insight appear normal. Mood is appropriate for condition and setting ?   ?   ?   ?Labs on Admission:  ?Basic Metabolic Panel: ?Recent Labs  ?Lab 02/18/22 ?1450  ?NA 141  ?K 5.1  ?CL 106  ?CO2 23  ?GLUCOSE 165*  ?BUN 17  ?CREATININE 1.93*  ?CALCIUM 9.7  ? ?Liver Function Tests: ?Recent Labs  ?Lab 02/18/22 ?1450  ?AST 21  ?ALT 12  ?ALKPHOS 58  ?BILITOT 0.7  ?PROT 6.7  ?ALBUMIN 3.3*  ? ?No results for input(s): LIPASE, AMYLASE in the last 168 hours. ?No results for input(s): AMMONIA in the last 168 hours. ?CBC: ?Recent Labs  ?Lab 02/18/22 ?1450  ?WBC 6.6  ?NEUTROABS 4.0  ?HGB 10.8*  ?HCT 33.9*  ?MCV 86.9  ?PLT 364  ? ?Cardiac Enzymes: ?No results for input(s): CKTOTAL, CKMB, CKMBINDEX, TROPONINI in the last 168 hours. ? ?BNP (last 3 results) ?Recent Labs  ?  10/29/21 ?1900 10/30/21 ?0100 10/30/21 ?0700  ?BNP 1,141.5* 877.3* 617.6*  ? ? ?ProBNP (last 3 results) ?No results for input(s): PROBNP in the last 8760 hours. ? ?CBG: ?Recent Labs  ?Lab 02/18/22 ?1920  ?GLUCAP 132*  ? ? ?Radiological Exams on Admission: ?No results found. ? ?EKG: I independently viewed the EKG done and my findings are as followed: Sinus tachycardia rate of 108.  Nonspecific ST-T  changes.  QTc 444. ? ?Assessment/Plan ?Present on Admission: ? GI bleed ? ?Principal Problem: ?  GI bleed ? ?Lower GI bleed ?Presented with painless hematochezia, onset today with 2 large-volume bloody st

## 2022-02-18 NOTE — ED Notes (Signed)
Pt is sitting outside of triage  ?

## 2022-02-18 NOTE — ED Provider Notes (Signed)
?[facility] DEPT ?[facility] Emergency Department ?Provider Note ?MRN:  [id number]  ?Arrival date & time: 02/18/22    ? ?Chief Complaint   ?GI Bleeding ?  ?History of Present Illness   ?[name] is a 71 y.o. year-old female with a history of cardiac arrest due to saddle pulmonary embolism presenting to the ED with chief complaint of GI bleeding. ? ?Patient with recent extensive hospitalization for 2 months after V-fib arrest from saddle PE, discharged last month.  More recently had a readmission for hypoglycemic episode.  Discharged just a few days ago.  Today had multiple episodes of bloody stool.  No abdominal pain, no fever, no other complaints.  On Eliquis. ? ?Review of Systems  ?A thorough review of systems was obtained and all systems are negative except as noted in the HPI and PMH.  ? ?Patient's Health History   ? ?Past Medical History:  ?Diagnosis Date  ? Anxiety   ? Arthritis   ? Asthma   ? Cardiac arrest ([place]) 10/28/2021  ? Depression   ? Diabetes mellitus without complication ([place])   ? Fibromyalgia   ? GERD (gastroesophageal reflux disease)   ? Hyperlipemia   ? Hypertension   ? PONV (postoperative nausea and vomiting)   ?  ?Past Surgical History:  ?Procedure Laterality Date  ? COLONOSCOPY    ? DILATION AND CURETTAGE OF UTERUS    ? IR EMBO ART  VEN HEMORR LYMPH EXTRAV  INC GUIDE ROADMAPPING  11/01/2021  ? IR FLUORO GUIDE CV LINE RIGHT  11/20/2021  ? IR REMOVAL TUN CV CATH W/O FL  12/20/2021  ? IR THROMBECT PRIM MECH INIT (INCLU) MOD SED  10/29/2021  ? IR US GUIDE VASC ACCESS RIGHT  10/29/2021  ? IR US GUIDE VASC ACCESS RIGHT  11/01/2021  ? IR US GUIDE VASC ACCESS RIGHT  11/21/2021  ? KNEE ARTHROSCOPY  [id number]  ? left  ? SHOULDER ARTHROSCOPY    ? left  ? TRIGGER FINGER RELEASE  08/13/2012  ? Procedure: RELEASE TRIGGER FINGER/A-1 PULLEY;  Surgeon: [name]., MD;  Location: [facility];  Service: Orthopedics;  Laterality: Right;  long finger  ? TUBAL LIGATION    ?  ?Family  History  ?Problem Relation Age of Onset  ? Diabetes Mother   ? Heart attack Father   ? Diabetes Sister   ? Throat cancer Brother   ? Hypertension Daughter   ?  ?Social History  ? ?Socioeconomic History  ? Marital status: Divorced  ?  Spouse name: Not on file  ? Number of children: Not on file  ? Years of education: Not on file  ? Highest education level: Not on file  ?Occupational History  ? Not on file  ?Tobacco Use  ? Smoking status: Former  ?  Packs/day: 0.50  ?  Years: 20.00  ?  Pack years: 10.00  ?  Types: Cigarettes  ?  Quit date: 08/10/2010  ?  Years since quitting: 11.5  ? Smokeless tobacco: Never  ?Vaping Use  ? Vaping Use: Never used  ?Substance and Sexual Activity  ? Alcohol use: No  ? Drug use: No  ? Sexual activity: Not on file  ?Other Topics Concern  ? Not on file  ?Social History Narrative  ? Not on file  ? ?Social Determinants of Health  ? ?Financial Resource Strain: Not on file  ?Food Insecurity: Not on file  ?Transportation Needs: Not on file  ?Physical Activity: Not on file  ?  Stress: Not on file  ?Social Connections: Not on file  ?Intimate Partner Violence: Not on file  ?  ? ?Physical Exam  ? ?Vitals:  ? 02/18/22 2254 02/18/22 2300  ?BP: 117/66 115/77  ?Pulse: (!) 107 (!) 103  ?Resp: (!) 23 16  ?Temp:    ?SpO2: 100% 100%  ?  ?CONSTITUTIONAL: Well-appearing, NAD ?NEURO/PSYCH:  Alert and oriented x 3, no focal deficits ?EYES:  eyes equal and reactive ?ENT/NECK:  no LAD, no JVD ?CARDIO: Tachycardic rate, well-perfused, normal S1 and S2 ?PULM:  CTAB no wheezing or rhonchi ?GI/GU:  non-distended, non-tender ?MSK/SPINE:  No gross deformities, no edema ?SKIN:  no rash, atraumatic ? ? ?*Additional and/or pertinent findings included in MDM below ? ?Diagnostic and Interventional Summary  ? ? EKG Interpretation ? ?Date/Time:  Monday February 18 2022 19:03:36 EDT ?Ventricular Rate:  108 ?PR Interval:  130 ?QRS Duration: 80 ?QT Interval:  332 ?QTC Calculation: 444 ?R Axis:   153 ?Text Interpretation: Sinus  tachycardia Left posterior fascicular block Cannot rule out Anterior infarct , age undetermined Abnormal ECG When compared with ECG of 09-Jan-2022 10:17, PREVIOUS ECG IS PRESENT Confirmed by Gerlene Fee [phone]) on 02/18/2022 11:10:34 PM ?  ? ?  ? ?Labs Reviewed  ?COMPREHENSIVE METABOLIC PANEL - Abnormal; Notable for the following components:  ?    Result Value  ? Glucose, Bld 165 (*)   ? Creatinine, Ser 1.93 (*)   ? Albumin 3.3 (*)   ? GFR, Estimated 27 (*)   ? All other components within normal limits  ?CBC WITH DIFFERENTIAL/PLATELET - Abnormal; Notable for the following components:  ? Hemoglobin 10.8 (*)   ? HCT 33.9 (*)   ? All other components within normal limits  ?CBG MONITORING, ED - Abnormal; Notable for the following components:  ? Glucose-Capillary 132 (*)   ? All other components within normal limits  ?POC OCCULT BLOOD, ED  ?TYPE AND SCREEN  ?  ?No orders to display  ?  ?Medications  ?sodium chloride 0.9 % bolus 500 mL (has no administration in time range)  ?  ? ?Procedures  /  Critical Care ?Procedures ? ?ED Course and Medical Decision Making  ?Initial Impression and Ddx ?Concern for lower GI bleed, anticoagulated.  Abdomen soft and nontender, no pain.  Vital signs overall reassuring, some mild tachycardia.  H&H appears stable.  No bleeding episodes over the past few hours.  Will request observation admission. ? ?Past medical/surgical history that increases complexity of ED encounter: PE on Eliquis ? ?Interpretation of Diagnostics ?I personally reviewed the EKG and my interpretation is as follows: Sinus rhythm ? ?No significant blood count or electrolyte disturbance, no significant change from recent baseline. ?   ? ?Patient Reassessment and Ultimate Disposition/Management ?Plan for hospitalist admission. ? ?Patient management required discussion with the following services or consulting groups:  Hospitalist Service ? ?Complexity of Problems Addressed ?Acute illness or injury that poses threat of life  of bodily function ? ?Additional Data Reviewed and Analyzed ?Further history obtained from: ?Further history from spouse/family member ? ?Additional Factors Impacting ED Encounter Risk ?Consideration of hospitalization ? ?Barth Kirks. Sedonia Small, MD ?[facility] Emergency Medicine ?[place] [email or web address] ? ?Final Clinical Impressions(s) / ED Diagnoses  ? ?  ICD-10-CM   ?1. Acute GI bleeding  K92.2   ?  ?2. Anticoagulated  Z79.01   ?  ?  ?ED Discharge Orders   ? ? None  ? ?  ?  ? ?Discharge Instructions Discussed with  and Provided to Patient:  ? ?Discharge Instructions   ?None ?  ? ?  ?Maudie Flakes, MD ?02/18/22 2333 ? ?

## 2022-02-18 NOTE — ED Triage Notes (Signed)
Patient BIB [facility] from home, complaint of blood in stools this morning, PCP advised to come be seen.  ?

## 2022-02-19 DIAGNOSIS — E1122 Type 2 diabetes mellitus with diabetic chronic kidney disease: Secondary | ICD-10-CM | POA: Diagnosis present

## 2022-02-19 DIAGNOSIS — K921 Melena: Secondary | ICD-10-CM | POA: Diagnosis present

## 2022-02-19 DIAGNOSIS — K635 Polyp of colon: Secondary | ICD-10-CM | POA: Diagnosis present

## 2022-02-19 DIAGNOSIS — N184 Chronic kidney disease, stage 4 (severe): Secondary | ICD-10-CM | POA: Diagnosis present

## 2022-02-19 DIAGNOSIS — K219 Gastro-esophageal reflux disease without esophagitis: Secondary | ICD-10-CM | POA: Diagnosis present

## 2022-02-19 DIAGNOSIS — Z8674 Personal history of sudden cardiac arrest: Secondary | ICD-10-CM | POA: Diagnosis not present

## 2022-02-19 DIAGNOSIS — I129 Hypertensive chronic kidney disease with stage 1 through stage 4 chronic kidney disease, or unspecified chronic kidney disease: Secondary | ICD-10-CM | POA: Diagnosis present

## 2022-02-19 DIAGNOSIS — Z808 Family history of malignant neoplasm of other organs or systems: Secondary | ICD-10-CM | POA: Diagnosis not present

## 2022-02-19 DIAGNOSIS — K644 Residual hemorrhoidal skin tags: Secondary | ICD-10-CM | POA: Diagnosis present

## 2022-02-19 DIAGNOSIS — E119 Type 2 diabetes mellitus without complications: Secondary | ICD-10-CM | POA: Diagnosis present

## 2022-02-19 DIAGNOSIS — Z8249 Family history of ischemic heart disease and other diseases of the circulatory system: Secondary | ICD-10-CM | POA: Diagnosis not present

## 2022-02-19 DIAGNOSIS — Z833 Family history of diabetes mellitus: Secondary | ICD-10-CM | POA: Diagnosis not present

## 2022-02-19 DIAGNOSIS — Z86711 Personal history of pulmonary embolism: Secondary | ICD-10-CM | POA: Diagnosis not present

## 2022-02-19 DIAGNOSIS — E785 Hyperlipidemia, unspecified: Secondary | ICD-10-CM | POA: Diagnosis present

## 2022-02-19 DIAGNOSIS — I445 Left posterior fascicular block: Secondary | ICD-10-CM | POA: Diagnosis present

## 2022-02-19 DIAGNOSIS — I10 Essential (primary) hypertension: Secondary | ICD-10-CM | POA: Diagnosis not present

## 2022-02-19 DIAGNOSIS — D62 Acute posthemorrhagic anemia: Secondary | ICD-10-CM | POA: Diagnosis present

## 2022-02-19 DIAGNOSIS — F32A Depression, unspecified: Secondary | ICD-10-CM | POA: Diagnosis present

## 2022-02-19 DIAGNOSIS — Z87891 Personal history of nicotine dependence: Secondary | ICD-10-CM | POA: Diagnosis not present

## 2022-02-19 DIAGNOSIS — M797 Fibromyalgia: Secondary | ICD-10-CM | POA: Diagnosis present

## 2022-02-19 DIAGNOSIS — D638 Anemia in other chronic diseases classified elsewhere: Secondary | ICD-10-CM | POA: Diagnosis present

## 2022-02-19 DIAGNOSIS — K648 Other hemorrhoids: Secondary | ICD-10-CM | POA: Diagnosis present

## 2022-02-19 DIAGNOSIS — K5730 Diverticulosis of large intestine without perforation or abscess without bleeding: Secondary | ICD-10-CM | POA: Diagnosis present

## 2022-02-19 DIAGNOSIS — J45909 Unspecified asthma, uncomplicated: Secondary | ICD-10-CM | POA: Diagnosis present

## 2022-02-19 DIAGNOSIS — K922 Gastrointestinal hemorrhage, unspecified: Secondary | ICD-10-CM | POA: Diagnosis present

## 2022-02-19 DIAGNOSIS — I2602 Saddle embolus of pulmonary artery with acute cor pulmonale: Secondary | ICD-10-CM | POA: Diagnosis not present

## 2022-02-19 DIAGNOSIS — M1990 Unspecified osteoarthritis, unspecified site: Secondary | ICD-10-CM | POA: Diagnosis present

## 2022-02-19 LAB — CBC WITH DIFFERENTIAL/PLATELET
Abs Immature Granulocytes: 0.02 10*3/uL (ref 0.00–0.07)
Basophils Absolute: 0.1 10*3/uL (ref 0.0–0.1)
Basophils Relative: 1 %
Eosinophils Absolute: 0.1 10*3/uL (ref 0.0–0.5)
Eosinophils Relative: 2 %
HCT: 28.3 % — ABNORMAL LOW (ref 36.0–46.0)
Hemoglobin: 9.1 g/dL — ABNORMAL LOW (ref 12.0–15.0)
Immature Granulocytes: 0 %
Lymphocytes Relative: 36 %
Lymphs Abs: 2.5 10*3/uL (ref 0.7–4.0)
MCH: 28 pg (ref 26.0–34.0)
MCHC: 32.2 g/dL (ref 30.0–36.0)
MCV: 87.1 fL (ref 80.0–100.0)
Monocytes Absolute: 0.7 10*3/uL (ref 0.1–1.0)
Monocytes Relative: 10 %
Neutro Abs: 3.6 10*3/uL (ref 1.7–7.7)
Neutrophils Relative %: 51 %
Platelets: 309 10*3/uL (ref 150–400)
RBC: 3.25 MIL/uL — ABNORMAL LOW (ref 3.87–5.11)
RDW: 12.8 % (ref 11.5–15.5)
WBC: 7 10*3/uL (ref 4.0–10.5)
nRBC: 0 % (ref 0.0–0.2)

## 2022-02-19 LAB — COMPREHENSIVE METABOLIC PANEL
ALT: 13 U/L (ref 0–44)
AST: 17 U/L (ref 15–41)
Albumin: 2.9 g/dL — ABNORMAL LOW (ref 3.5–5.0)
Alkaline Phosphatase: 50 U/L (ref 38–126)
Anion gap: 6 (ref 5–15)
BUN: 19 mg/dL (ref 8–23)
CO2: 25 mmol/L (ref 22–32)
Calcium: 9.2 mg/dL (ref 8.9–10.3)
Chloride: 110 mmol/L (ref 98–111)
Creatinine, Ser: 2 mg/dL — ABNORMAL HIGH (ref 0.44–1.00)
GFR, Estimated: 26 mL/min — ABNORMAL LOW (ref >60)
Glucose, Bld: 133 mg/dL — ABNORMAL HIGH (ref 70–99)
Potassium: 4.4 mmol/L (ref 3.5–5.1)
Sodium: 141 mmol/L (ref 135–145)
Total Bilirubin: 0.7 mg/dL (ref 0.3–1.2)
Total Protein: 6.1 g/dL — ABNORMAL LOW (ref 6.5–8.1)

## 2022-02-19 LAB — HEMOGLOBIN AND HEMATOCRIT, BLOOD
HCT: 27.4 % — ABNORMAL LOW (ref 36.0–46.0)
Hemoglobin: 8.8 g/dL — ABNORMAL LOW (ref 12.0–15.0)

## 2022-02-19 LAB — PHOSPHORUS: Phosphorus: 3.4 mg/dL (ref 2.5–4.6)

## 2022-02-19 LAB — RETICULOCYTES
Immature Retic Fract: 10.8 % (ref 2.3–15.9)
RBC.: 3.07 MIL/uL — ABNORMAL LOW (ref 3.87–5.11)
Retic Count, Absolute: 35 10*3/uL (ref 19.0–186.0)
Retic Ct Pct: 1.1 % (ref 0.4–3.1)

## 2022-02-19 LAB — MAGNESIUM: Magnesium: 1.7 mg/dL (ref 1.7–2.4)

## 2022-02-19 LAB — MRSA NEXT GEN BY PCR, NASAL: MRSA by PCR Next Gen: NOT DETECTED

## 2022-02-19 MED ORDER — MUPIROCIN 2 % EX OINT: 1.0000 "application " | TOPICAL_OINTMENT | Freq: Two times a day (BID) | CUTANEOUS | Status: DC

## 2022-02-19 MED ORDER — CHLORHEXIDINE GLUCONATE CLOTH 2 % EX PADS: 6.0000 | MEDICATED_PAD | Freq: Every day | CUTANEOUS | Status: DC

## 2022-02-19 MED ORDER — LACTATED RINGERS IV SOLN: INTRAVENOUS | Status: AC

## 2022-02-19 MED ORDER — LIDOCAINE 5 % EX PTCH
2.0000 | MEDICATED_PATCH | Freq: Every day | CUTANEOUS | Status: DC
  Administered 2022-02-21 – 2022-02-23 (×3): 2 via TRANSDERMAL
  Filled 2022-02-19 (×3): qty 2

## 2022-02-19 MED ORDER — ACETAMINOPHEN 325 MG PO TABS
650.0000 mg | ORAL_TABLET | Freq: Four times a day (QID) | ORAL | Status: DC | PRN
  Filled 2022-02-19: qty 2

## 2022-02-19 MED ORDER — ALBUTEROL SULFATE (2.5 MG/3ML) 0.083% IN NEBU: 2.5000 mg | INHALATION_SOLUTION | RESPIRATORY_TRACT | Status: DC | PRN

## 2022-02-19 MED ORDER — PEG 3350-KCL-NA BICARB-NACL 420 G PO SOLR
4000.0000 mL | Freq: Once | ORAL | Status: DC
  Filled 2022-02-19: qty 4000

## 2022-02-19 MED ORDER — FOLIC ACID 1 MG PO TABS
2.0000 mg | ORAL_TABLET | Freq: Every day | ORAL | Status: DC
  Administered 2022-02-20 – 2022-02-23 (×4): 2 mg via ORAL
  Filled 2022-02-19 (×4): qty 2

## 2022-02-19 MED ORDER — MELATONIN 3 MG PO TABS
3.0000 mg | ORAL_TABLET | Freq: Every evening | ORAL | Status: DC | PRN
  Administered 2022-02-19 – 2022-02-20 (×2): 3 mg via ORAL
  Filled 2022-02-19 (×2): qty 1

## 2022-02-19 MED ORDER — PANTOPRAZOLE SODIUM 40 MG PO TBEC
40.0000 mg | DELAYED_RELEASE_TABLET | Freq: Two times a day (BID) | ORAL | Status: DC
  Administered 2022-02-19 – 2022-02-23 (×8): 40 mg via ORAL
  Filled 2022-02-19 (×9): qty 1

## 2022-02-19 MED ORDER — PAROXETINE HCL 20 MG PO TABS
40.0000 mg | ORAL_TABLET | Freq: Every day | ORAL | Status: DC
  Administered 2022-02-19 – 2022-02-22 (×3): 40 mg via ORAL
  Filled 2022-02-19 (×6): qty 2

## 2022-02-19 MED ORDER — POLYETHYLENE GLYCOL 3350 17 G PO PACK: 17.0000 g | PACK | Freq: Every day | ORAL | Status: DC | PRN

## 2022-02-19 MED ORDER — DULOXETINE HCL 20 MG PO CPEP
20.0000 mg | ORAL_CAPSULE | Freq: Every day | ORAL | Status: DC
Start: 2022-02-19 — End: 2022-02-23
  Administered 2022-02-20 – 2022-02-23 (×4): 20 mg via ORAL
  Filled 2022-02-19 (×5): qty 1

## 2022-02-19 MED ORDER — ONDANSETRON HCL 4 MG/2ML IJ SOLN: 4.0000 mg | Freq: Four times a day (QID) | INTRAMUSCULAR | Status: DC | PRN

## 2022-02-19 MED ORDER — DICYCLOMINE HCL 10 MG PO CAPS
10.0000 mg | ORAL_CAPSULE | Freq: Three times a day (TID) | ORAL | Status: DC
Start: 2022-02-19 — End: 2022-02-23
  Administered 2022-02-20 – 2022-02-23 (×8): 10 mg via ORAL
  Filled 2022-02-19 (×13): qty 1

## 2022-02-19 MED ORDER — SODIUM ZIRCONIUM CYCLOSILICATE 10 G PO PACK: 10.0000 g | PACK | Freq: Once | ORAL | Status: DC

## 2022-02-19 MED ORDER — ATORVASTATIN CALCIUM 10 MG PO TABS
20.0000 mg | ORAL_TABLET | Freq: Every day | ORAL | Status: DC
  Administered 2022-02-20 – 2022-02-23 (×4): 20 mg via ORAL
  Filled 2022-02-19 (×4): qty 2

## 2022-02-19 NOTE — Evaluation (Signed)
Physical Therapy Evaluation ?Patient Details ?Name: [name] ?MRN: [id number] ?DOB: 1951/10/03 ?Today's Date: 02/19/2022 ? ?History of Present Illness ? Pt is a 71 y/o female presenting on 4/24 with hematochezia. Found with lower GI bleed. PMH includes: chronic anxiety/depression, arthrtis, fibromyalgia, type 2 DM, HTN, recent admissions for v-fib arrest due to saddle pulmonary embolism and hypoglycemia.  ?Clinical Impression ? Pt was seen for progressing from bed to sit to stand, after discussing the physical challenges of climbing stairs to get up to her bedroom.  Pt is contending that a R side rail would solve the issue of going upstairs at home, but then did voice understanding that the posterior lean of standing presents a greater concern.  Recommending CIR to maximize her therapy concentration, but if not approved will default to SNF.  Pt is motivated to work, and did not have any light headed feelings with sitting and standing with PT.  Pt is fairly weak and debilitated on RLE including the foot drop, and reports this has been the case for a long time.  Follow up for goals of acute PT as ordered below. ?   ? ?Recommendations for follow up therapy are one component of a multi-disciplinary discharge planning process, led by the attending physician.  Recommendations may be updated based on patient status, additional functional criteria and insurance authorization. ? ?Follow Up Recommendations Acute inpatient rehab (3hours/day) ? ?  ?Assistance Recommended at Discharge Frequent or constant Supervision/Assistance  ?Patient can return home with the following ? A lot of help with walking and/or transfers;A little help with bathing/dressing/bathroom;Assistance with cooking/housework;Assist for transportation;Help with stairs or ramp for entrance ? ?  ?Equipment Recommendations None recommended by PT  ?Recommendations for Other Services ? Rehab consult  ?  ?Functional Status Assessment Patient has had a recent  decline in their functional status and demonstrates the ability to make significant improvements in function in a reasonable and predictable amount of time.  ? ?  ?Precautions / Restrictions Precautions ?Precautions: Fall ?Precaution Comments: watch HR ?Restrictions ?Weight Bearing Restrictions: No  ? ?  ? ?Mobility ? Bed Mobility ?Overal bed mobility: Needs Assistance ?Bed Mobility: Supine to Sit, Sit to Supine ?  ?  ?Supine to sit: Mod assist ?Sit to supine: Min assist ?  ?General bed mobility comments: mod to power up trunk and min to return for trunk support, but mod to adjust posture on gurney ?  ? ?Transfers ?Overall transfer level: Needs assistance ?Equipment used: Rolling walker (2 wheels), 1 person hand held assist ?Transfers: Sit to/from Stand ?Sit to Stand: Mod assist ?  ?  ?  ?  ?  ?General transfer comment: mod assist with cues to stay flexed to initiate, resulting in better control until fully upright and extending more.  RW is helpful to minimize backward lean ?  ? ?Ambulation/Gait ?  ?  ?  ?  ?  ?  ?  ?General Gait Details: unable ? ?Stairs ?  ?  ?  ?  ?  ? ?Wheelchair Mobility ?  ? ?Modified Rankin (Stroke Patients Only) ?  ? ?  ? ?Balance Overall balance assessment: Needs assistance ?Sitting-balance support: Feet supported, Single extremity supported ?Sitting balance-Leahy Scale: Fair ?  ?  ?Standing balance support: Bilateral upper extremity supported, During functional activity ?Standing balance-Leahy Scale: Poor ?Standing balance comment: even on walker is requiring help to stay forward, but more controlled with times at min assist to steady balance ?  ?  ?  ?  ?  ?  ?  ?  ?  ?  ?  ?   ? ? ? ?  Pertinent Vitals/Pain Pain Assessment ?Pain Assessment: No/denies pain  ? ? ?Home Living Family/patient expects to be discharged to:: Private residence ?Living Arrangements: Children ?Available Help at Discharge: Family;Available 24 hours/day ?Type of Home: House ?Home Access: Stairs to enter ?Entrance  Stairs-Rails: None ?Entrance Stairs-Number of Steps: 1 ?Alternate Level Stairs-Number of Steps: 6 stairs, landing, then 6 more ?Home Layout: Multi-level;Bed/bath upstairs ?Home Equipment: Shower seat;Cane - quad;BSC/3in1;Hand held Engineering geologist (2 wheels);Wheelchair - manual ?Additional Comments: family discussion about the stairs at pt's home being more manageable with a R side rail, but also PT cautioned with posterior LOB the challenges  ?  ?Prior Function Prior Level of Function : Needs assist ?  ?  ?  ?  ?  ?  ?Mobility Comments: using RW for transfers with some help ?ADLs Comments: some assist for ADLS as needed ?  ? ? ?Hand Dominance  ? Dominant Hand: Right ? ?  ?Extremity/Trunk Assessment  ? Upper Extremity Assessment ?Upper Extremity Assessment: Defer to OT evaluation ?  ? ?Lower Extremity Assessment ?Lower Extremity Assessment: Generalized weakness;RLE deficits/detail ?RLE Deficits / Details: drop foot ?RLE Sensation: decreased proprioception;decreased light touch ?RLE Coordination: decreased gross motor ?  ? ?Cervical / Trunk Assessment ?Cervical / Trunk Assessment: Kyphotic  ?Communication  ? Communication: No difficulties  ?Cognition Arousal/Alertness: Awake/alert ?Behavior During Therapy: [facility] for tasks assessed/performed ?Overall Cognitive Status: Impaired/Different from baseline ?Area of Impairment: Awareness, Problem solving, Following commands, Safety/judgement ?  ?  ?  ?  ?  ?  ?  ?  ?  ?  ?  ?Following Commands: Follows one step commands consistently, Follows one step commands with increased time, Follows multi-step commands inconsistently ?Safety/Judgement: Decreased awareness of safety, Decreased awareness of deficits ?Awareness: Intellectual, Emergent ?Problem Solving: Slow processing, Difficulty sequencing, Requires verbal cues, Requires tactile cues ?General Comments: pt is fairly unaware of the amount of help she needs to balance in standing ?  ?  ? ?  ?General Comments General  comments (skin integrity, edema, etc.): HR was 149 at the highest, unrelated to activity as she was sitting on side of bed ? ?  ?Exercises    ? ?Assessment/Plan  ?  ?PT Assessment Patient needs continued PT services  ?PT Problem List Decreased strength;Decreased range of motion;Decreased activity tolerance;Decreased balance;Decreased mobility;Cardiopulmonary status limiting activity;Impaired sensation;Decreased coordination ? ?   ?  ?PT Treatment Interventions DME instruction;Gait training;Functional mobility training;Therapeutic activities;Therapeutic exercise;Balance training;Neuromuscular re-education;Patient/family education;Stair training   ? ?PT Goals (Current goals can be found in the Care Plan section)  ?Acute Rehab PT Goals ?Patient Stated Goal: to go home ?PT Goal Formulation: With patient/family ?Time For Goal Achievement: 03/05/22 ?Potential to Achieve Goals: Good ? ?  ?Frequency Min 3X/week ?  ? ? ?Co-evaluation   ?  ?  ?  ?  ? ? ?  ?AM-PAC PT "6 Clicks" Mobility  ?Outcome Measure Help needed turning from your back to your side while in a flat bed without using bedrails?: A Little ?Help needed moving from lying on your back to sitting on the side of a flat bed without using bedrails?: A Lot ?Help needed moving to and from a bed to a chair (including a wheelchair)?: A Lot ?Help needed standing up from a chair using your arms (e.g., wheelchair or bedside chair)?: A Lot ?Help needed to walk in hospital room?: Total ?Help needed climbing 3-5 steps with a railing? : Total ?6 Click Score: 11 ? ?  ?End of Session Equipment Utilized During Treatment: Gait  belt ?Activity Tolerance: Patient limited by fatigue;Treatment limited secondary to medical complications (Comment) ?Patient left: in bed;with call bell/phone within reach;with family/visitor present ?Nurse Communication: Mobility status ?PT Visit Diagnosis: Unsteadiness on feet (R26.81);Muscle weakness (generalized) (M62.81);Difficulty in walking, not  elsewhere classified (R26.2) (postural imbalance) ?  ? ?Time: 3912-2583 ?PT Time Calculation (min) (ACUTE ONLY): 29 min ? ? ?Charges:   PT Evaluation ?$PT Eval Moderate Complexity: 1 Mod ?PT Treatments ?$Therapeuti

## 2022-02-19 NOTE — Progress Notes (Signed)
? ?  Inpatient Rehab Admissions Coordinator : ? ?Per  PT therapy recommendations patient was screened for CIR candidacy by Danne Baxter RN MSN.  OT recommends SNF.Patient does not appear to demonstrate the medical neccesity for a [facility] /CIR admit .Observation status. I will not place a Rehab Consult. Recommend other Rehab Venues to be pursued.  Please contact me with any questions. ? ?Danne Baxter RN MSN ?Admissions Coordinator ?[phone]  ?

## 2022-02-19 NOTE — Progress Notes (Signed)
?PROGRESS NOTE ? ? ? ?[name]  IDP:[id number] DOB: 10-08-1951 DOA: 02/18/2022 ?PCP: [name], MD  ? ? ?Brief Narrative:  ? ?[name] is a 71 year old female with past medical history significant for chronic anxiety/depression, osteoarthritis, fibromyalgia, asthma, type 2 diabetes mellitus, GERD, hyperlipidemia, essential hypertension, history of V-fib arrest secondary to saddle pulmonary embolism on Eliquis who presented to [facility] ED on 4/24 from home via EMS for blood in her stool.  Patient reports 2 large-volume bloody stools on day of admission.  Not associate with abdominal pain or tenderness.  Also denies fever/chills.  Complicated by need of anticoagulation with Eliquis for recent saddle PE February 2023. ? ?In the ED, temperature 98.1 ?F, HR 92, RR 16, BP 104 56, SPO2 99% on room air. WBC 6.6, hemoglobin 10.8, platelets 364.  Sodium 141, potassium 5.1, chloride 106, CO2 23, glucose 165, BUN 17, creatinine 1.93.  AST 21, ALT 12, total bilirubin 0.7.  Due to concern for lower GI bleed, likely diverticular, EDP requested admission.  Hospitalist service consulted for further evaluation management. ? ? ?Assessment & Plan: ?  ? ?Lower GI bleed ?Patient presenting to ED with 2 large-volume bright red bloody stools.  Complicated by use of anticoagulation with Eliquis. ?--[facility] following; appreciate assistance ?--Hgb 10.8>9.1 ?--Anemia panel: Pending ?--Continue monitor hemoglobin every 8 hours ?--Transfuse for active bleeding or hemoglobin less than 7.0 ?--Clear liquid diet, n.p.o. after midnight ?--GI plans EGD/colonoscopy tomorrow ? ?History of V-fib arrest secondary to saddle pulmonary embolism on anticoagulation ?--Continue to hold home Eliquis ?--Monitor on telemetry ?--await further recommendations from GI on when to restart anticoagulation ? ?Mild hyperkalemia ?Potassium 5.1 on admission.  Received Lokelma 10 g p.o. x1. ?--BMP in the a.m. ?--Monitor on telemetry ? ?Essential  hypertension ?Home regimen includes Toprol tartrate 12.5 mg p.o. twice daily. ?--Holding home antihypertensives with borderline hypotension ?--Continue monitor BP closely ? ?Anxiety/depression ?--Paxil 40 mg p.o. daily ? ?CKD stage IV ?Creatinine 1.93 on admission.  No other results in EMR/care everywhere for comparison. ?--Cr 1.93>2.00 ?--Avoid nephrotoxins, renal dose all medications ?--Repeat BMP in a.m. ? ?Type 2 diabetes mellitus ?Hemoglobin A1c 6.3 on 01/09/2022, well controlled. ?--SSI for coverage ?--CBGs qAC/HS ? ?Chronic pain/fibromyalgia: ?--Duloxetine 20 mg p.o. daily, lidocaine patch ? ?GERD: ?--Protonix 40 mg p.o. daily ? ?Physical deconditioning: ?--PT/OT recommend SNF, TOC for evaluation ? ? ? ? ?DVT prophylaxis:   Chemical DVT prophylaxis contraindicated in the setting of acute GI bleed ? ?  Code Status: Full Code ?Family Communication: Updated family present at bedside this morning ? ?Disposition Plan:  ?Level of care: Telemetry Medical ?Status is: Observation ?The patient will require care spanning > 2 midnights and should be moved to inpatient because: Requiring IV fluid hydration, further monitoring of hemoglobin in the setting of acute GI bleed, pending EGD/colonoscopy tomorrow ?  ? ?Consultants:  ?[facility] ? ?Procedures:  ?None ? ?Antimicrobials:  ?None ? ? ?Subjective: ?Patient seen examined at bedside, resting comfortably.  Daughter present.  No specific complaints at this time.  No further bloody bowel movements since arriving to the ED.  Seen by GI plans for EGD/colonoscopy tomorrow.  No other questions or concerns at this time.  Denies headache, no dizziness, no fever/chills/night sweats, no nausea/vomiting/diarrhea, no chest pain, no palpitations, no shortness of breath, no abdominal pain, no weakness, no fatigue, no paresthesias.  No acute events overnight reported by nursing staff. ? ?Objective: ?Vitals:  ? 02/19/22 1030 02/19/22 1115 02/19/22 1245 02/19/22 1415  ?BP: 134/85  132/76  127/76 (!) 144/78  ?Pulse: 95 99 (!) 103 89  ?Resp: '14 17 16 15  '$ ?Temp:      ?TempSrc:      ?SpO2: 98% 100% 100% 100%  ? ? ?Intake/Output Summary (Last 24 hours) at 02/19/2022 1500 ?Last data filed at 02/19/2022 0048 ?Gross per 24 hour  ?Intake 500 ml  ?Output --  ?Net 500 ml  ? ?There were no vitals filed for this visit. ? ?Examination: ? ?Physical Exam: ?GEN: NAD, alert and oriented x 3, wd/wn ?HEENT: NCAT, PERRL, EOMI, sclera clear, MMM ?PULM: CTAB w/o wheezes/crackles, normal respiratory effort, on room air ?CV: RRR w/o M/G/R ?GI: abd soft, NTND, NABS, no R/G/M ?MSK: no peripheral edema, muscle strength globally intact 5/5 bilateral upper/lower extremities ?NEURO: CN II-XII intact, no focal deficits, sensation to light touch intact ?PSYCH: normal mood/affect ?Integumentary: dry/intact, no rashes or wounds ? ? ? ?Data Reviewed: I have personally reviewed following labs and imaging studies ? ?CBC: ?Recent Labs  ?Lab 02/18/22 ?1450 02/19/22 ?0402  ?WBC 6.6 7.0  ?NEUTROABS 4.0 3.6  ?HGB 10.8* 9.1*  ?HCT 33.9* 28.3*  ?MCV 86.9 87.1  ?PLT 364 309  ? ?Basic Metabolic Panel: ?Recent Labs  ?Lab 02/18/22 ?1450 02/19/22 ?0402  ?NA 141 141  ?K 5.1 4.4  ?CL 106 110  ?CO2 23 25  ?GLUCOSE 165* 133*  ?BUN 17 19  ?CREATININE 1.93* 2.00*  ?CALCIUM 9.7 9.2  ?MG  --  1.7  ?PHOS  --  3.4  ? ?GFR: ?Estimated Creatinine Clearance: 22.5 mL/min (A) (by C-G formula based on SCr of 2 mg/dL (H)). ?Liver Function Tests: ?Recent Labs  ?Lab 02/18/22 ?1450 02/19/22 ?0402  ?AST 21 17  ?ALT 12 13  ?ALKPHOS 58 50  ?BILITOT 0.7 0.7  ?PROT 6.7 6.1*  ?ALBUMIN 3.3* 2.9*  ? ?No results for input(s): LIPASE, AMYLASE in the last 168 hours. ?No results for input(s): AMMONIA in the last 168 hours. ?Coagulation Profile: ?No results for input(s): INR, PROTIME in the last 168 hours. ?Cardiac Enzymes: ?No results for input(s): CKTOTAL, CKMB, CKMBINDEX, TROPONINI in the last 168 hours. ?BNP (last 3 results) ?No results for input(s): PROBNP in the last 8760  hours. ?HbA1C: ?No results for input(s): HGBA1C in the last 72 hours. ?CBG: ?Recent Labs  ?Lab 02/18/22 ?1920  ?GLUCAP 132*  ? ?Lipid Profile: ?No results for input(s): CHOL, HDL, LDLCALC, TRIG, CHOLHDL, LDLDIRECT in the last 72 hours. ?Thyroid Function Tests: ?No results for input(s): TSH, T4TOTAL, FREET4, T3FREE, THYROIDAB in the last 72 hours. ?Anemia Panel: ?Recent Labs  ?  02/19/22 ?1400  ?RETICCTPCT 1.1  ? ?Sepsis Labs: ?No results for input(s): PROCALCITON, LATICACIDVEN in the last 168 hours. ? ?No results found for this or any previous visit (from the past 240 hour(s)).  ? ? ? ? ? ?Radiology Studies: ?No results found. ? ? ? ? ? ?Scheduled Meds: ? atorvastatin  20 mg Oral Daily  ? dicyclomine  10 mg Oral TID AC  ? DULoxetine  20 mg Oral Daily  ? folic acid  2 mg Oral Daily  ? lidocaine  2 patch Transdermal Daily  ? pantoprazole  40 mg Oral BID  ? PARoxetine  40 mg Oral QHS  ? polyethylene glycol-electrolytes  4,000 mL Oral Once  ? sodium zirconium cyclosilicate  10 g Oral Once  ? ?Continuous Infusions: ? lactated ringers 50 mL/hr at 02/19/22 0053  ? ? ? LOS: 0 days  ? ? ?Time spent: 52 minutes spent on chart review,  discussion with nursing staff, consultants, updating family and interview/physical exam; more than 50% of that time was spent in counseling and/or coordination of care. ? ? ? ?Irie Dowson J [place] DO ?[facility] ?Available via Epic secure chat 7am-7pm ?After these hours, please refer to coverage provider listed on [email or web address] ?02/19/2022, 3:00 PM  ? ?

## 2022-02-19 NOTE — Consult Note (Signed)
Referring Provider:  TH ?Primary Care Physician:  [name], MD ?Primary Gastroenterologist:  [facility] ? ?Reason for Consultation:  GI bleed ? ?HPI: [name] is a 71 y.o. female with past medical history of V-fib from saddle pulmonary embolus in March 2023 currently on anticoagulation, history of ITP, and other chronic comorbidities presented to the hospital with rectal bleeding.  Described as painless rectal bleeding with 2 bloody bowel movement yesterday.  No bowel movement today.  Had some abdominal cramps on Saturday.  Denies any nausea or vomiting. ? ?Last colonoscopy several years ago.  No family history of colon cancer. ? ?Denies active chest pain echocardiogram in March 2023 showed normal EF. ? ?Past Medical History:  ?Diagnosis Date  ? Anxiety   ? Arthritis   ? Asthma   ? Cardiac arrest ([place]) 10/28/2021  ? Depression   ? Diabetes mellitus without complication ([place])   ? Fibromyalgia   ? GERD (gastroesophageal reflux disease)   ? Hyperlipemia   ? Hypertension   ? PONV (postoperative nausea and vomiting)   ? ? ?Past Surgical History:  ?Procedure Laterality Date  ? COLONOSCOPY    ? DILATION AND CURETTAGE OF UTERUS    ? IR EMBO ART  VEN HEMORR LYMPH EXTRAV  INC GUIDE ROADMAPPING  11/01/2021  ? IR FLUORO GUIDE CV LINE RIGHT  11/20/2021  ? IR REMOVAL TUN CV CATH W/O FL  12/20/2021  ? IR THROMBECT PRIM MECH INIT (INCLU) MOD SED  10/29/2021  ? IR US GUIDE VASC ACCESS RIGHT  10/29/2021  ? IR US GUIDE VASC ACCESS RIGHT  11/01/2021  ? IR US GUIDE VASC ACCESS RIGHT  11/21/2021  ? KNEE ARTHROSCOPY  [id number]  ? left  ? SHOULDER ARTHROSCOPY    ? left  ? TRIGGER FINGER RELEASE  08/13/2012  ? Procedure: RELEASE TRIGGER FINGER/A-1 PULLEY;  Surgeon: [name]., MD;  Location: [place];  Service: Orthopedics;  Laterality: Right;  long finger  ? TUBAL LIGATION    ? ? ?Prior to Admission medications   ?Medication Sig Start Date End Date Taking? Authorizing Provider  ?acetaminophen (TYLENOL) 325  MG tablet Take 2 tablets (650 mg total) by mouth every 4 (four) hours as needed for fever (greater than 37.6 C). 01/07/22  Yes Angiulli, Lavon Paganini, PA-C  ?albuterol (VENTOLIN HFA) 108 (90 Base) MCG/ACT inhaler Inhale 1-2 puffs into the lungs every 4 (four) hours as needed for wheezing or shortness of breath. 02/14/22  Yes Medina-Vargas, Monina C, NP  ?apixaban (ELIQUIS) 5 MG TABS tablet Take 1 tablet (5 mg total) by mouth 2 (two) times daily. 02/14/22  Yes Medina-Vargas, Monina C, NP  ?atorvastatin (LIPITOR) 20 MG tablet Take 1 tablet (20 mg total) by mouth daily. 02/14/22  Yes Medina-Vargas, Monina C, NP  ?B Complex-C (B-COMPLEX WITH VITAMIN C) tablet Take 1 tablet by mouth daily. 01/08/22  Yes Angiulli, Lavon Paganini, PA-C  ?dicyclomine (BENTYL) 10 MG capsule Take 1 capsule (10 mg total) by mouth 3 (three) times daily before meals. 02/14/22  Yes Medina-Vargas, Monina C, NP  ?DULoxetine (CYMBALTA) 20 MG capsule Take 1 capsule (20 mg total) by mouth daily. 02/14/22  Yes Medina-Vargas, Monina C, NP  ?folic acid (FOLVITE) 1 MG tablet Take 2 tablets (2 mg total) by mouth daily. 02/14/22  Yes Medina-Vargas, Monina C, NP  ?Glucerna (GLUCERNA) LIQD Take 237 mLs by mouth daily.   Yes [provider]  ?lidocaine (LIDODERM) 5 % Place 2 patches onto the skin daily.  Remove & Discard patch within 12 hours or as directed by MD 02/14/22  Yes Medina-Vargas, Monina C, NP  ?melatonin 5 MG TABS Take 1 tablet (5 mg total) by mouth every evening. 02/14/22  Yes Medina-Vargas, Monina C, NP  ?metoprolol tartrate (LOPRESSOR) 25 MG tablet Take 0.5 tablets (12.5 mg total) by mouth 2 (two) times daily. 02/14/22  Yes Medina-Vargas, Monina C, NP  ?pantoprazole (PROTONIX) 40 MG tablet Take 1 tablet (40 mg total) by mouth 2 (two) times daily. 02/14/22  Yes Medina-Vargas, Monina C, NP  ?PRESCRIPTION MEDICATION See admin instructions. A cream that starts with an M for a sore on the back of her head   Yes [provider]  ?Vitamin D, Ergocalciferol,  (DRISDOL) 1.25 MG (50000 UNIT) CAPS capsule Take 1 capsule (50,000 Units total) by mouth once a week. 01/07/22  Yes Angiulli, Lavon Paganini, PA-C  ?PARoxetine (PAXIL) 40 MG tablet Take 1 tablet (40 mg total) by mouth at bedtime. ?Patient not taking: Reported on 02/19/2022 02/14/22   Medina-Vargas, Senaida Lange, NP  ? ? ?Scheduled Meds: ? atorvastatin  20 mg Oral Daily  ? dicyclomine  10 mg Oral TID AC  ? DULoxetine  20 mg Oral Daily  ? folic acid  2 mg Oral Daily  ? lidocaine  2 patch Transdermal Daily  ? pantoprazole  40 mg Oral BID  ? PARoxetine  40 mg Oral QHS  ? polyethylene glycol-electrolytes  4,000 mL Oral Once  ? sodium zirconium cyclosilicate  10 g Oral Once  ? ?Continuous Infusions: ? lactated ringers 50 mL/hr at 02/19/22 0053  ? ?PRN Meds:.acetaminophen, albuterol, melatonin, ondansetron (ZOFRAN) IV, polyethylene glycol ? ?Allergies as of 02/18/2022 - Review Complete 02/18/2022  ?Allergen Reaction Noted  ? Aspirin Nausea And Vomiting 08/10/2012  ? Benadryl [diphenhydramine]  08/12/2014  ? Darvon [propoxyphene] Itching 08/12/2014  ? Hydrocodone bit-homatrop mbr Itching 10/26/2020  ? Metformin  10/26/2020  ? Other  10/26/2020  ? Pentazocine  08/10/2012  ? Percocet [oxycodone-acetaminophen]  08/12/2014  ? Sulfa antibiotics Hives 08/10/2012  ? Tetracaine hcl  10/26/2020  ? Tetracyclines & related Hives 08/10/2012  ? Tramadol  10/26/2020  ? ? ?Family History  ?Problem Relation Age of Onset  ? Diabetes Mother   ? Heart attack Father   ? Diabetes Sister   ? Throat cancer Brother   ? Hypertension Daughter   ? ? ?Social History  ? ?Socioeconomic History  ? Marital status: Divorced  ?  Spouse name: Not on file  ? Number of children: Not on file  ? Years of education: Not on file  ? Highest education level: Not on file  ?Occupational History  ? Not on file  ?Tobacco Use  ? Smoking status: Former  ?  Packs/day: 0.50  ?  Years: 20.00  ?  Pack years: 10.00  ?  Types: Cigarettes  ?  Quit date: 08/10/2010  ?  Years since quitting:  11.5  ? Smokeless tobacco: Never  ?Vaping Use  ? Vaping Use: Never used  ?Substance and Sexual Activity  ? Alcohol use: No  ? Drug use: No  ? Sexual activity: Not on file  ?Other Topics Concern  ? Not on file  ?Social History Narrative  ? Not on file  ? ?Social Determinants of Health  ? ?Financial Resource Strain: Not on file  ?Food Insecurity: Not on file  ?Transportation Needs: Not on file  ?Physical Activity: Not on file  ?Stress: Not on file  ?Social Connections: Not on file  ?  Intimate Partner Violence: Not on file  ? ? ?Review of Systems: All negative except as stated above in HPI. ? ?Physical Exam: ?Vital signs: ?Vitals:  ? 02/19/22 1245 02/19/22 1415  ?BP: 127/76 (!) 144/78  ?Pulse: (!) 103 89  ?Resp: 16 15  ?Temp:    ?SpO2: 100% 100%  ? ?  ?General:   Elderly patient, not in acute distress. ?HEENT : Normocephalic, atraumatic, extraocular movement ?Lungs: No Visible respiratory distress ?Heart:  Regular rate and rhythm; no murmurs, clicks, rubs,  or gallops. ?Abdomen: Soft, nontender, nondistended, bowel sounds present.  No peritoneal sign ?Rectal:  Deferred ? ?GI:  ?Lab Results: ?Recent Labs  ?  02/18/22 ?1450 02/19/22 ?0402  ?WBC 6.6 7.0  ?HGB 10.8* 9.1*  ?HCT 33.9* 28.3*  ?PLT 364 309  ? ?BMET ?Recent Labs  ?  02/18/22 ?1450 02/19/22 ?0402  ?NA 141 141  ?K 5.1 4.4  ?CL 106 110  ?CO2 23 25  ?GLUCOSE 165* 133*  ?BUN 17 19  ?CREATININE 1.93* 2.00*  ?CALCIUM 9.7 9.2  ? ?LFT ?Recent Labs  ?  02/19/22 ?0402  ?PROT 6.1*  ?ALBUMIN 2.9*  ?AST 17  ?ALT 13  ?ALKPHOS 50  ?BILITOT 0.7  ? ?PT/INR ?No results for input(s): LABPROT, INR in the last 72 hours. ? ? ?Studies/Results: ?No results found. ? ?Impression/Plan: ?-GI bleed.  Rectal bleeding with bright red blood and blood clots in the commode.  Hemoglobin relatively stable. ?-History of pulmonary embolism.  Was on Eliquis.  Currently on hold ?-Acute on chronic anemia ? ?Recommendations ?--------------------------- ?-Long discussion with patient as well as patient's  daughter in the room.  Different options such as conservative management versus bleeding scan versus endoscopic evaluation with EGD and colonoscopy discussed. ?-They prefer EGD and colonoscopy.  Livia Snellen

## 2022-02-19 NOTE — Evaluation (Addendum)
Occupational Therapy Evaluation ?Patient Details ?Name: [name] ?MRN: [id number] ?DOB: 04-15-1951 ?Today's Date: 02/19/2022 ? ? ?History of Present Illness Pt is a 71 y/o female presenting on 4/24 with hematochezia. Found with lower GI bleed. PMH includes: chronic anxiety/depression, arthrtis, fibromyalgia, type 2 DM, HTN, recent admissions for v-fib arrest due to saddle pulmonary embolism and hypoglycemia.  ? ?Clinical Impression ?  ?PTA patient living with support of her daughter who assists with ADLs, transfers as needed.  Daughter reports fluctuating assist required, but typically able to dress and transfer using RW with minimal assist, using w/c for mobility.  Admitted for above and presents with problem list below, including generalized weakness, impaired balance and decreased activity tolerance.  Pt completing bed mobility with mod assist, transfers (sit to stand) with max assist and ADLs with up to total assist.  Standing at EOB with heavy posterior lean and poor awareness to safety and deficits, continuing to attempt transfer but required cueing stop due to decreased safety (unable to attempt without +2 assist). Assisted back to bed to use bed pan.  Based on performance today, believe pt will benefit from continued OT services acutely and after dc at SNF level to optimize return to independence and decrease burden of care.  Will follow.  ?   ? ?Recommendations for follow up therapy are one component of a multi-disciplinary discharge planning process, led by the attending physician.  Recommendations may be updated based on patient status, additional functional criteria and insurance authorization.  ? ?Follow Up Recommendations ? Skilled nursing-short term rehab (<3 hours/day)  ?  ?Assistance Recommended at Discharge Frequent or constant Supervision/Assistance  ?Patient can return home with the following Two people to help with walking and/or transfers;A lot of help with  bathing/dressing/bathroom;Assistance with cooking/housework;Direct supervision/assist for medications management;Direct supervision/assist for financial management;Assist for transportation;Help with stairs or ramp for entrance ? ?  ?Functional Status Assessment ? Patient has had a recent decline in their functional status and demonstrates the ability to make significant improvements in function in a reasonable and predictable amount of time.  ?Equipment Recommendations ? None recommended by OT (has needed DME)  ?  ?Recommendations for Other Services PT consult ? ? ?  ?Precautions / Restrictions Precautions ?Precautions: Fall ?Precaution Comments: watch HR ?Restrictions ?Weight Bearing Restrictions: No  ? ?  ? ?Mobility Bed Mobility ?Overal bed mobility: Needs Assistance ?Bed Mobility: Supine to Sit, Sit to Supine ?  ?  ?Supine to sit: Mod assist ?Sit to supine: Mod assist ?  ?General bed mobility comments: mod assist for trunk support to ascend ?  ? ?Transfers ?  ?  ?  ?  ?  ?  ?  ?  ?  ?  ?  ? ?  ?Balance Overall balance assessment: Needs assistance ?Sitting-balance support: No upper extremity supported, Feet supported ?Sitting balance-Leahy Scale: Fair ?Sitting balance - Comments: min guard for safety dynamically ?  ?Standing balance support: Bilateral upper extremity supported, During functional activity ?Standing balance-Leahy Scale: Poor ?Standing balance comment: heavy posterior lean relies on external support ?  ?  ?  ?  ?  ?  ?  ?  ?  ?  ?  ?   ? ?ADL either performed or assessed with clinical judgement  ? ?ADL Overall ADL's : Needs assistance/impaired ?  ?  ?Grooming: Min guard;Sitting ?  ?  ?  ?  ?  ?Upper Body Dressing : Minimal assistance;Sitting ?  ?Lower Body Dressing: Total assistance;Sit to/from stand ?Lower Body  Dressing Details (indicate cue type and reason): assist for socks, max assist in standing ?  ?Toilet Transfer Details (indicate cue type and reason): unable due to safety ?Toileting-  Clothing Manipulation and Hygiene: Moderate assistance ?Toileting - Clothing Manipulation Details (indicate cue type and reason): pt able to partially manage clothing and complete hygiene from bed pan level ?  ?  ?Functional mobility during ADLs: Maximal assistance;Cueing for safety;Cueing for sequencing ?General ADL Comments: pt limited by impaired balance, weakness and decreased activity tolerance  ? ? ? ?Vision   ?Vision Assessment?: No apparent visual deficits  ?   ?Perception   ?  ?Praxis   ?  ? ?Pertinent Vitals/Pain Pain Assessment ?Pain Assessment: No/denies pain  ? ? ? ?Hand Dominance Right ?  ?Extremity/Trunk Assessment Upper Extremity Assessment ?Upper Extremity Assessment: Generalized weakness ?  ?Lower Extremity Assessment ?Lower Extremity Assessment: Defer to PT evaluation ?  ?  ?  ?Communication Communication ?Communication: No difficulties ?  ?Cognition Arousal/Alertness: Awake/alert ?Behavior During Therapy: [facility] for tasks assessed/performed ?Overall Cognitive Status: Impaired/Different from baseline ?Area of Impairment: Awareness, Problem solving, Following commands, Safety/judgement ?  ?  ?  ?  ?  ?  ?  ?  ?  ?  ?  ?Following Commands: Follows one step commands consistently, Follows one step commands with increased time, Follows multi-step commands inconsistently ?Safety/Judgement: Decreased awareness of deficits, Decreased awareness of safety ?Awareness: Emergent ?Problem Solving: Slow processing, Difficulty sequencing, Requires verbal cues ?General Comments: pt with poor awareness of safety and deficits, follows simple commands and oriented ?  ?  ?General Comments  daughter at side and supportive; noted HR up to 140 with EOB activity ? ?  ?Exercises   ?  ?Shoulder Instructions    ? ? ?Home Living Family/patient expects to be discharged to:: Private residence ?Living Arrangements: Children ?Available Help at Discharge: Family;Available 24 hours/day ?Type of Home: House ?Home Access: Stairs to  enter ?Entrance Stairs-Number of Steps: 1 ?Entrance Stairs-Rails: None ?Home Layout: Multi-level;Bed/bath upstairs ?  ?  ?Bathroom Shower/Tub: Tub/shower unit ?  ?Bathroom Toilet: Handicapped height ?  ?  ?Home Equipment: Shower seat;Cane - quad;BSC/3in1;Hand held Engineering geologist (2 wheels);Wheelchair - manual ?  ?Additional Comments: trying to get setup on 1st level of home ?  ? ?  ?Prior Functioning/Environment Prior Level of Function : Needs assist ?  ?  ?  ?  ?  ?  ?Mobility Comments: using RW for transfers with some help ?ADLs Comments: some assist for ADLS as needed ?  ? ?  ?  ?OT Problem List: Decreased strength;Decreased activity tolerance;Impaired balance (sitting and/or standing);Decreased cognition;Decreased coordination;Decreased safety awareness;Decreased knowledge of use of DME or AE;Decreased knowledge of precautions;Cardiopulmonary status limiting activity ?  ?   ?OT Treatment/Interventions: Self-care/ADL training;Therapeutic exercise;DME and/or AE instruction;Therapeutic activities;Balance training;Patient/family education;Cognitive remediation/compensation  ?  ?OT Goals(Current goals can be found in the care plan section) Acute Rehab OT Goals ?Patient Stated Goal: get better ?OT Goal Formulation: With patient ?Time For Goal Achievement: 03/05/22 ?Potential to Achieve Goals: Good  ?OT Frequency: Min 2X/week ?  ? ?Co-evaluation   ?  ?  ?  ?  ? ?  ?AM-PAC OT "6 Clicks" Daily Activity     ?Outcome Measure Help from another person eating meals?: Total (NPO) ?Help from another person taking care of personal grooming?: A Little ?Help from another person toileting, which includes using toliet, bedpan, or urinal?: A Lot ?Help from another person bathing (including washing, rinsing, drying)?: A Lot ?Help  from another person to put on and taking off regular upper body clothing?: A Little ?Help from another person to put on and taking off regular lower body clothing?: Total ?6 Click Score: 12 ?   ?End of Session Equipment Utilized During Treatment: Gait belt ?Nurse Communication: Mobility status ? ?Activity Tolerance: Patient tolerated treatment well ?Patient left: in bed;with call bell/phone within reach;with family/visitor present ? ?OT

## 2022-02-19 NOTE — H&P (View-Only) (Signed)
Referring Provider:  TH ?Primary Care Physician:  [name], MD ?Primary Gastroenterologist:  [facility] ? ?Reason for Consultation:  GI bleed ? ?HPI: [name] is a 71 y.o. female with past medical history of V-fib from saddle pulmonary embolus in March 2023 currently on anticoagulation, history of ITP, and other chronic comorbidities presented to the hospital with rectal bleeding.  Described as painless rectal bleeding with 2 bloody bowel movement yesterday.  No bowel movement today.  Had some abdominal cramps on Saturday.  Denies any nausea or vomiting. ? ?Last colonoscopy several years ago.  No family history of colon cancer. ? ?Denies active chest pain echocardiogram in March 2023 showed normal EF. ? ?Past Medical History:  ?Diagnosis Date  ? Anxiety   ? Arthritis   ? Asthma   ? Cardiac arrest ([place]) 10/28/2021  ? Depression   ? Diabetes mellitus without complication ([place])   ? Fibromyalgia   ? GERD (gastroesophageal reflux disease)   ? Hyperlipemia   ? Hypertension   ? PONV (postoperative nausea and vomiting)   ? ? ?Past Surgical History:  ?Procedure Laterality Date  ? COLONOSCOPY    ? DILATION AND CURETTAGE OF UTERUS    ? IR EMBO ART  VEN HEMORR LYMPH EXTRAV  INC GUIDE ROADMAPPING  11/01/2021  ? IR FLUORO GUIDE CV LINE RIGHT  11/20/2021  ? IR REMOVAL TUN CV CATH W/O FL  12/20/2021  ? IR THROMBECT PRIM MECH INIT (INCLU) MOD SED  10/29/2021  ? IR US GUIDE VASC ACCESS RIGHT  10/29/2021  ? IR US GUIDE VASC ACCESS RIGHT  11/01/2021  ? IR US GUIDE VASC ACCESS RIGHT  11/21/2021  ? KNEE ARTHROSCOPY  [id number]  ? left  ? SHOULDER ARTHROSCOPY    ? left  ? TRIGGER FINGER RELEASE  08/13/2012  ? Procedure: RELEASE TRIGGER FINGER/A-1 PULLEY;  Surgeon: [name]., MD;  Location: [facility];  Service: Orthopedics;  Laterality: Right;  long finger  ? TUBAL LIGATION    ? ? ?Prior to Admission medications   ?Medication Sig Start Date End Date Taking? Authorizing Provider  ?acetaminophen (TYLENOL) 325  MG tablet Take 2 tablets (650 mg total) by mouth every 4 (four) hours as needed for fever (greater than 37.6 C). 01/07/22  Yes Angiulli, Lavon Paganini, PA-C  ?albuterol (VENTOLIN HFA) 108 (90 Base) MCG/ACT inhaler Inhale 1-2 puffs into the lungs every 4 (four) hours as needed for wheezing or shortness of breath. 02/14/22  Yes Medina-Vargas, Monina C, NP  ?apixaban (ELIQUIS) 5 MG TABS tablet Take 1 tablet (5 mg total) by mouth 2 (two) times daily. 02/14/22  Yes Medina-Vargas, Monina C, NP  ?atorvastatin (LIPITOR) 20 MG tablet Take 1 tablet (20 mg total) by mouth daily. 02/14/22  Yes Medina-Vargas, Monina C, NP  ?B Complex-C (B-COMPLEX WITH VITAMIN C) tablet Take 1 tablet by mouth daily. 01/08/22  Yes Angiulli, Lavon Paganini, PA-C  ?dicyclomine (BENTYL) 10 MG capsule Take 1 capsule (10 mg total) by mouth 3 (three) times daily before meals. 02/14/22  Yes Medina-Vargas, Monina C, NP  ?DULoxetine (CYMBALTA) 20 MG capsule Take 1 capsule (20 mg total) by mouth daily. 02/14/22  Yes Medina-Vargas, Monina C, NP  ?folic acid (FOLVITE) 1 MG tablet Take 2 tablets (2 mg total) by mouth daily. 02/14/22  Yes Medina-Vargas, Monina C, NP  ?Glucerna (GLUCERNA) LIQD Take 237 mLs by mouth daily.   Yes [provider]  ?lidocaine (LIDODERM) 5 % Place 2 patches onto the skin daily.  Remove & Discard patch within 12 hours or as directed by MD 02/14/22  Yes Medina-Vargas, Monina C, NP  ?melatonin 5 MG TABS Take 1 tablet (5 mg total) by mouth every evening. 02/14/22  Yes Medina-Vargas, Monina C, NP  ?metoprolol tartrate (LOPRESSOR) 25 MG tablet Take 0.5 tablets (12.5 mg total) by mouth 2 (two) times daily. 02/14/22  Yes Medina-Vargas, Monina C, NP  ?pantoprazole (PROTONIX) 40 MG tablet Take 1 tablet (40 mg total) by mouth 2 (two) times daily. 02/14/22  Yes Medina-Vargas, Monina C, NP  ?PRESCRIPTION MEDICATION See admin instructions. A cream that starts with an M for a sore on the back of her head   Yes [provider]  ?Vitamin D, Ergocalciferol,  (DRISDOL) 1.25 MG (50000 UNIT) CAPS capsule Take 1 capsule (50,000 Units total) by mouth once a week. 01/07/22  Yes Angiulli, Lavon Paganini, PA-C  ?PARoxetine (PAXIL) 40 MG tablet Take 1 tablet (40 mg total) by mouth at bedtime. ?Patient not taking: Reported on 02/19/2022 02/14/22   Medina-Vargas, Senaida Lange, NP  ? ? ?Scheduled Meds: ? atorvastatin  20 mg Oral Daily  ? dicyclomine  10 mg Oral TID AC  ? DULoxetine  20 mg Oral Daily  ? folic acid  2 mg Oral Daily  ? lidocaine  2 patch Transdermal Daily  ? pantoprazole  40 mg Oral BID  ? PARoxetine  40 mg Oral QHS  ? polyethylene glycol-electrolytes  4,000 mL Oral Once  ? sodium zirconium cyclosilicate  10 g Oral Once  ? ?Continuous Infusions: ? lactated ringers 50 mL/hr at 02/19/22 0053  ? ?PRN Meds:.acetaminophen, albuterol, melatonin, ondansetron (ZOFRAN) IV, polyethylene glycol ? ?Allergies as of 02/18/2022 - Review Complete 02/18/2022  ?Allergen Reaction Noted  ? Aspirin Nausea And Vomiting 08/10/2012  ? Benadryl [diphenhydramine]  08/12/2014  ? Darvon [propoxyphene] Itching 08/12/2014  ? Hydrocodone bit-homatrop mbr Itching 10/26/2020  ? Metformin  10/26/2020  ? Other  10/26/2020  ? Pentazocine  08/10/2012  ? Percocet [oxycodone-acetaminophen]  08/12/2014  ? Sulfa antibiotics Hives 08/10/2012  ? Tetracaine hcl  10/26/2020  ? Tetracyclines & related Hives 08/10/2012  ? Tramadol  10/26/2020  ? ? ?Family History  ?Problem Relation Age of Onset  ? Diabetes Mother   ? Heart attack Father   ? Diabetes Sister   ? Throat cancer Brother   ? Hypertension Daughter   ? ? ?Social History  ? ?Socioeconomic History  ? Marital status: Divorced  ?  Spouse name: Not on file  ? Number of children: Not on file  ? Years of education: Not on file  ? Highest education level: Not on file  ?Occupational History  ? Not on file  ?Tobacco Use  ? Smoking status: Former  ?  Packs/day: 0.50  ?  Years: 20.00  ?  Pack years: 10.00  ?  Types: Cigarettes  ?  Quit date: 08/10/2010  ?  Years since quitting:  11.5  ? Smokeless tobacco: Never  ?Vaping Use  ? Vaping Use: Never used  ?Substance and Sexual Activity  ? Alcohol use: No  ? Drug use: No  ? Sexual activity: Not on file  ?Other Topics Concern  ? Not on file  ?Social History Narrative  ? Not on file  ? ?Social Determinants of Health  ? ?Financial Resource Strain: Not on file  ?Food Insecurity: Not on file  ?Transportation Needs: Not on file  ?Physical Activity: Not on file  ?Stress: Not on file  ?Social Connections: Not on file  ?  Intimate Partner Violence: Not on file  ? ? ?Review of Systems: All negative except as stated above in HPI. ? ?Physical Exam: ?Vital signs: ?Vitals:  ? 02/19/22 1245 02/19/22 1415  ?BP: 127/76 (!) 144/78  ?Pulse: (!) 103 89  ?Resp: 16 15  ?Temp:    ?SpO2: 100% 100%  ? ?  ?General:   Elderly patient, not in acute distress. ?HEENT : Normocephalic, atraumatic, extraocular movement ?Lungs: No Visible respiratory distress ?Heart:  Regular rate and rhythm; no murmurs, clicks, rubs,  or gallops. ?Abdomen: Soft, nontender, nondistended, bowel sounds present.  No peritoneal sign ?Rectal:  Deferred ? ?GI:  ?Lab Results: ?Recent Labs  ?  02/18/22 ?1450 02/19/22 ?0402  ?WBC 6.6 7.0  ?HGB 10.8* 9.1*  ?HCT 33.9* 28.3*  ?PLT 364 309  ? ?BMET ?Recent Labs  ?  02/18/22 ?1450 02/19/22 ?0402  ?NA 141 141  ?K 5.1 4.4  ?CL 106 110  ?CO2 23 25  ?GLUCOSE 165* 133*  ?BUN 17 19  ?CREATININE 1.93* 2.00*  ?CALCIUM 9.7 9.2  ? ?LFT ?Recent Labs  ?  02/19/22 ?0402  ?PROT 6.1*  ?ALBUMIN 2.9*  ?AST 17  ?ALT 13  ?ALKPHOS 50  ?BILITOT 0.7  ? ?PT/INR ?No results for input(s): LABPROT, INR in the last 72 hours. ? ? ?Studies/Results: ?No results found. ? ?Impression/Plan: ?-GI bleed.  Rectal bleeding with bright red blood and blood clots in the commode.  Hemoglobin relatively stable. ?-History of pulmonary embolism.  Was on Eliquis.  Currently on hold ?-Acute on chronic anemia ? ?Recommendations ?--------------------------- ?-Long discussion with patient as well as patient's  daughter in the room.  Different options such as conservative management versus bleeding scan versus endoscopic evaluation with EGD and colonoscopy discussed. ?-They prefer EGD and colonoscopy.  Livia Snellen

## 2022-02-19 NOTE — Progress Notes (Signed)
RN has been encouraging patient to drink bowel prep fluids.  This has been a challenge.  Pt arrived to the floor at 20:50 and bowel prep has not been started yet. ?

## 2022-02-20 ENCOUNTER — Inpatient Hospital Stay (HOSPITAL_COMMUNITY): Payer: Medicare PPO | Admitting: Anesthesiology

## 2022-02-20 ENCOUNTER — Encounter (HOSPITAL_COMMUNITY)
Admission: EM | Disposition: A | Discharge status: Home Health | Payer: Self-pay | Source: Home / Self Care | Attending: Internal Medicine

## 2022-02-20 DIAGNOSIS — K644 Residual hemorrhoidal skin tags: Secondary | ICD-10-CM

## 2022-02-20 DIAGNOSIS — K648 Other hemorrhoids: Secondary | ICD-10-CM

## 2022-02-20 DIAGNOSIS — K5730 Diverticulosis of large intestine without perforation or abscess without bleeding: Secondary | ICD-10-CM

## 2022-02-20 DIAGNOSIS — K922 Gastrointestinal hemorrhage, unspecified: Secondary | ICD-10-CM | POA: Diagnosis not present

## 2022-02-20 DIAGNOSIS — K635 Polyp of colon: Secondary | ICD-10-CM

## 2022-02-20 HISTORY — PX: COLONOSCOPY WITH PROPOFOL: SHX5780

## 2022-02-20 HISTORY — PX: ESOPHAGOGASTRODUODENOSCOPY: SHX5428

## 2022-02-20 HISTORY — PX: POLYPECTOMY: SHX5525

## 2022-02-20 LAB — BASIC METABOLIC PANEL
Anion gap: 9 (ref 5–15)
BUN: 18 mg/dL (ref 8–23)
CO2: 22 mmol/L (ref 22–32)
Calcium: 9 mg/dL (ref 8.9–10.3)
Chloride: 106 mmol/L (ref 98–111)
Creatinine, Ser: 1.59 mg/dL — ABNORMAL HIGH (ref 0.44–1.00)
GFR, Estimated: 34 mL/min — ABNORMAL LOW (ref >60)
Glucose, Bld: 141 mg/dL — ABNORMAL HIGH (ref 70–99)
Potassium: 4.1 mmol/L (ref 3.5–5.1)
Sodium: 137 mmol/L (ref 135–145)

## 2022-02-20 LAB — HEMOGLOBIN AND HEMATOCRIT, BLOOD
HCT: 28.3 % — ABNORMAL LOW (ref 36.0–46.0)
Hemoglobin: 9.1 g/dL — ABNORMAL LOW (ref 12.0–15.0)

## 2022-02-20 LAB — CBC
HCT: 25.6 % — ABNORMAL LOW (ref 36.0–46.0)
Hemoglobin: 8.5 g/dL — ABNORMAL LOW (ref 12.0–15.0)
MCH: 28.1 pg (ref 26.0–34.0)
MCHC: 33.2 g/dL (ref 30.0–36.0)
MCV: 84.8 fL (ref 80.0–100.0)
Platelets: 288 10*3/uL (ref 150–400)
RBC: 3.02 MIL/uL — ABNORMAL LOW (ref 3.87–5.11)
RDW: 12.7 % (ref 11.5–15.5)
WBC: 6.6 10*3/uL (ref 4.0–10.5)
nRBC: 0 % (ref 0.0–0.2)

## 2022-02-20 LAB — GLUCOSE, CAPILLARY: Glucose-Capillary: 140 mg/dL — ABNORMAL HIGH (ref 70–99)

## 2022-02-20 SURGERY — COLONOSCOPY WITH PROPOFOL
Anesthesia: Monitor Anesthesia Care

## 2022-02-20 MED ORDER — PROPOFOL 500 MG/50ML IV EMUL
INTRAVENOUS | Status: DC | PRN
  Administered 2022-02-20: 100 ug/kg/min via INTRAVENOUS

## 2022-02-20 MED ORDER — LACTATED RINGERS IV SOLN
INTRAVENOUS | Status: AC | PRN
  Administered 2022-02-20: 20 mL/h via INTRAVENOUS

## 2022-02-20 MED ORDER — HYDROCORTISONE ACETATE 25 MG RE SUPP
25.0000 mg | Freq: Two times a day (BID) | RECTAL | Status: DC
  Administered 2022-02-21 – 2022-02-23 (×4): 25 mg via RECTAL
  Filled 2022-02-20 (×10): qty 1

## 2022-02-20 MED ORDER — LIDOCAINE 2% (20 MG/ML) 5 ML SYRINGE
INTRAMUSCULAR | Status: DC | PRN
Start: 2022-02-20 — End: 2022-02-20
  Administered 2022-02-20: 40 mg via INTRAVENOUS

## 2022-02-20 MED ORDER — PHENYLEPHRINE 80 MCG/ML (10ML) SYRINGE FOR IV PUSH (FOR BLOOD PRESSURE SUPPORT)
PREFILLED_SYRINGE | INTRAVENOUS | Status: DC | PRN
  Administered 2022-02-20: 80 ug via INTRAVENOUS

## 2022-02-20 MED ORDER — ALPRAZOLAM 0.25 MG PO TABS
0.2500 mg | ORAL_TABLET | Freq: Once | ORAL | Status: AC
  Administered 2022-02-20: 0.25 mg via ORAL
  Filled 2022-02-20: qty 1

## 2022-02-20 MED ORDER — FLEET ENEMA 7-19 GM/118ML RE ENEM
1.0000 | ENEMA | Freq: Once | RECTAL | Status: AC
  Administered 2022-02-20: 1 via RECTAL
  Filled 2022-02-20: qty 1

## 2022-02-20 MED ORDER — SODIUM CHLORIDE 0.9 % IV SOLN: INTRAVENOUS | Status: DC

## 2022-02-20 SURGICAL SUPPLY — 25 items

## 2022-02-20 NOTE — Progress Notes (Signed)
Inpatient Rehab Admissions Coordinator:  ? ?Stopped by pt's room, off unit for procedure.  Will f/u tomorrow.  ? ?Shann Medal, PT, DPT ?Admissions Coordinator ?[phone] ?02/20/22  ?3:45 PM ? ?

## 2022-02-20 NOTE — Progress Notes (Signed)
Physical Therapy Treatment ?Patient Details ?Name: [name] ?MRN: [id number] ?DOB: 1950/12/24 ?Today's Date: 02/20/2022 ? ? ?History of Present Illness Pt is a 71 y/o female presenting on 4/24 with hematochezia. Found with lower GI bleed. PMH includes: chronic anxiety/depression, arthrtis, fibromyalgia, type 2 DM, HTN, recent admissions for v-fib arrest due to saddle pulmonary embolism and hypoglycemia. ? ?  ?PT Comments  ? ? Pt is confused and disoriented upon PT arrival, attempting to get out of bed so she can "go back to [facility] for further work-up on after her colonoscopy last night". PT provides re-orientation however pt memory and safety awareness remain significantly altered throughout session. Pt with poor recall of PT verbal cues for transfer technique and demonstrates limited awareness of balance and safety deficits. Pt likely to have fallen 3-4 times without PT assistance. Pt also unable to stand without physical assistance and verbal cues for technique. Pt will benefit from continued acute PT services in an effort to improve mobility quality and reduce falls risk. PT updates recommendations to SNF placement.  ?Recommendations for follow up therapy are one component of a multi-disciplinary discharge planning process, led by the attending physician.  Recommendations may be updated based on patient status, additional functional criteria and insurance authorization. ? ?Follow Up Recommendations ? Skilled nursing-short term rehab (<3 hours/day) ?  ?  ?Assistance Recommended at Discharge Frequent or constant Supervision/Assistance  ?Patient can return home with the following A lot of help with walking and/or transfers;A little help with bathing/dressing/bathroom;Assistance with cooking/housework;Assist for transportation;Help with stairs or ramp for entrance ?  ?Equipment Recommendations ? None recommended by PT  ?  ?Recommendations for Other Services   ? ? ?  ?Precautions / Restrictions  Precautions ?Precautions: Fall ?Precaution Comments: watch HR ?Restrictions ?Weight Bearing Restrictions: No  ?  ? ?Mobility ? Bed Mobility ?Overal bed mobility: Needs Assistance ?Bed Mobility: Supine to Sit ?  ?  ?Supine to sit: Supervision ?  ?  ?General bed mobility comments: increased time, pt in long sitting attempting to get out of bed upon PT arrival ?  ? ?Transfers ?Overall transfer level: Needs assistance ?Equipment used: Rolling walker (2 wheels) ?Transfers: Sit to/from Stand ?Sit to Stand: Mod assist ?  ?  ?  ?  ?  ?General transfer comment: posterior lean. Pt requires verbal cues for hand placement and physical assistance in an effort to facilitate anterior lean ?  ? ?Ambulation/Gait ?Ambulation/Gait assistance: Mod assist, Min assist ?Gait Distance (Feet): 60 Feet (initial) ?Assistive device: Rolling walker (2 wheels) ?Gait Pattern/deviations: Step-to pattern, Step-through pattern, Decreased step length - left ?Gait velocity: reduced ?Gait velocity interpretation: <1.31 ft/sec, indicative of household ambulator ?  ?General Gait Details: pt with slowed step-to gait initially, LLE dragging some with external rotation. Pt progresses to step through gait with less LLE abduction and improved step length ? ? ?Stairs ?  ?  ?  ?  ?  ? ? ?Wheelchair Mobility ?  ? ?Modified Rankin (Stroke Patients Only) ?  ? ? ?  ?Balance Overall balance assessment: Needs assistance ?Sitting-balance support: No upper extremity supported, Feet supported ?Sitting balance-Leahy Scale: Fair ?  ?  ?Standing balance support: Bilateral upper extremity supported, Reliant on assistive device for balance ?Standing balance-Leahy Scale: Poor ?Standing balance comment: posterior lean initially, improves with continued standing and ambulation, but posterior lean is consistent with each sit to stand initially ?  ?  ?  ?  ?  ?  ?  ?  ?  ?  ?  ?  ? ?  ?  Cognition Arousal/Alertness: Awake/alert ?Behavior During Therapy: Impulsive ?Overall Cognitive  Status: Impaired/Different from baseline ?Area of Impairment: Orientation, Attention, Memory, Following commands, Safety/judgement, Awareness, Problem solving ?  ?  ?  ?  ?  ?  ?  ?  ?Orientation Level: Disoriented to, Place, Time, Situation ?Current Attention Level: Sustained ?Memory: Decreased recall of precautions, Decreased short-term memory ?Following Commands: Follows one step commands with increased time ?Safety/Judgement: Decreased awareness of safety, Decreased awareness of deficits ?Awareness: Intellectual ?Problem Solving: Slow processing, Difficulty sequencing, Requires verbal cues ?  ?  ?  ? ?  ?Exercises   ? ?  ?General Comments General comments (skin integrity, edema, etc.): pt off monitor during session, denies symptoms ?  ?  ? ?Pertinent Vitals/Pain Pain Assessment ?Pain Assessment: Faces ?Faces Pain Scale: Hurts little more ?Pain Location: L posterior leg ?Pain Descriptors / Indicators: Sore ?Pain Intervention(s): Monitored during session  ? ? ?Home Living   ?  ?  ?  ?  ?  ?  ?  ?  ?  ?   ?  ?Prior Function    ?  ?  ?   ? ?PT Goals (current goals can now be found in the care plan section) Acute Rehab PT Goals ?Patient Stated Goal: to go home ?Progress towards PT goals: Progressing toward goals ? ?  ?Frequency ? ? ? Min 3X/week ? ? ? ?  ?PT Plan Current plan remains appropriate  ? ? ?Co-evaluation   ?  ?  ?  ?  ? ?  ?AM-PAC PT "6 Clicks" Mobility   ?Outcome Measure ? Help needed turning from your back to your side while in a flat bed without using bedrails?: A Little ?Help needed moving from lying on your back to sitting on the side of a flat bed without using bedrails?: A Little ?Help needed moving to and from a bed to a chair (including a wheelchair)?: A Lot ?Help needed standing up from a chair using your arms (e.g., wheelchair or bedside chair)?: A Lot ?Help needed to walk in hospital room?: A Little ?Help needed climbing 3-5 steps with a railing? : Total ?6 Click Score: 14 ? ?  ?End of Session    ?Activity Tolerance: Patient tolerated treatment well ?Patient left: in chair;with call bell/phone within reach;with chair alarm set ?Nurse Communication: Mobility status ?PT Visit Diagnosis: Unsteadiness on feet (R26.81);Muscle weakness (generalized) (M62.81);Difficulty in walking, not elsewhere classified (R26.2) ?  ? ? ?Time: 5974-1638 ?PT Time Calculation (min) (ACUTE ONLY): 25 min ? ?Charges:  $Gait Training: 8-22 mins ?$Therapeutic Activity: 8-22 mins          ?          ? ?Zenaida Niece, PT, DPT ?Acute Rehabilitation ?Pager: [phone] ?Office [phone] ? ? ? ?Zenaida Niece ?02/20/2022, 5:01 PM ? ?

## 2022-02-20 NOTE — Brief Op Note (Signed)
02/18/2022 - 02/20/2022 ? ?1:43 PM ? ?PATIENT:  [name]  71 y.o. female ? ?PRE-OPERATIVE DIAGNOSIS:  GI bleed ? ?POST-OPERATIVE DIAGNOSIS:  polyp, TICS ? ?PROCEDURE:  Procedure(s): ?COLONOSCOPY WITH PROPOFOL (N/A) ?ESOPHAGOGASTRODUODENOSCOPY (EGD) (N/A) ?POLYPECTOMY ? ?SURGEON:  Surgeon(s) and Role: ?   * Otis Brace, MD - Primary ? ?Findings ?------------ ?-EGD showed mild edema in the stomach otherwise normal EGD. ?-Colonoscopy showed normal TI, pandiverticulosis, small polyp in the descending colon removed with cold snare and medium size internal hemorrhoids with streaks of blood on the hemorrhoidal tissue. ? ?Recommendations ?------------------------- ?-Anusol suppository twice daily  for 14 days ?-Okay to resume anticoagulation tomorrow from GI standpoint ?-Soft diet and advance as tolerated ?-Findings discussed with patient's daughter over the phone. ?-GI will sign off.  Call us back if needed ? ?Otis Brace MD, FACP ?02/20/2022, 1:45 PM ? ?Contact #  [phone]  ?

## 2022-02-20 NOTE — Interval H&P Note (Signed)
History and Physical Interval Note: ? ?02/20/2022 ?12:49 PM ? ?[name]  has presented today for surgery, with the diagnosis of GI bleed.  The various methods of treatment have been discussed with the patient and family. After consideration of risks, benefits and other options for treatment, the patient has consented to  Procedure(s): ?COLONOSCOPY WITH PROPOFOL (N/A) ?ESOPHAGOGASTRODUODENOSCOPY (EGD) (N/A) as a surgical intervention.  The patient's history has been reviewed, patient examined, no change in status, stable for surgery.  I have reviewed the patient's chart and labs.  Questions were answered to the patient's satisfaction.   ? ?Patient did not completed her prep. had 1 episode of bleeding this morning.  Hemoglobin 8.5 this morning. ? ?Markcus Lazenby ? ? ?

## 2022-02-20 NOTE — Op Note (Signed)
[facility] ?Patient Name: [name] ?Procedure Date : 02/20/2022 ?MRN: [id number] ?Attending MD: Otis Brace , MD ?Date of Birth: Sep 06, 1951 ?CSN: [id number] ?Age: 71 ?Admit Type: Inpatient ?Procedure:                Upper GI endoscopy ?Indications:              Recent gastrointestinal bleeding ?Providers:                Otis Brace, MD, Jeanella Cara, RN,  ?                          Luan Moore, Technician, Viann Fish, CRNA ?Referring MD:              ?Medicines:                Sedation Administered by an Anesthesia Professional ?Complications:            No immediate complications. ?Estimated Blood Loss:     Estimated blood loss was minimal. ?Procedure:                Pre-Anesthesia Assessment: ?                          - Prior to the procedure, a History and Physical  ?                          was performed, and patient medications and  ?                          allergies were reviewed. The patient's tolerance of  ?                          previous anesthesia was also reviewed. The risks  ?                          and benefits of the procedure and the sedation  ?                          options and risks were discussed with the patient.  ?                          All questions were answered, and informed consent  ?                          was obtained. Prior Anticoagulants: The patient has  ?                          taken Eliquis (apixaban), last dose was 2 days  ?                          prior to procedure. ASA Grade Assessment: III - A  ?                          patient with severe systemic disease. After  ?  reviewing the risks and benefits, the patient was  ?                          deemed in satisfactory condition to undergo the  ?                          procedure. ?                          After obtaining informed consent, the endoscope was  ?                          passed under direct vision. Throughout the  ?                           procedure, the patient's blood pressure, pulse, and  ?                          oxygen saturations were monitored continuously. The  ?                          GIF-[id number] ([id number]) Olympus endoscope was introduced  ?                          through the mouth, and advanced to the second part  ?                          of duodenum. The upper GI endoscopy was  ?                          accomplished without difficulty. The patient  ?                          tolerated the procedure well. ?Scope In: ?Scope Out: ?Findings: ?     The Z-line was regular and was found 37 cm from the incisors. ?     Mildly erythematous mucosa without bleeding was found in the gastric  ?     antrum. ?     The cardia and gastric fundus were normal on retroflexion. ?     The duodenal bulb, first portion of the duodenum and second portion of  ?     the duodenum were normal. ?Impression:               - Z-line regular, 37 cm from the incisors. ?                          - Erythematous mucosa in the antrum. ?                          - Normal duodenal bulb, first portion of the  ?                          duodenum and second portion of the duodenum. ?                          -  No specimens collected. ?Recommendation:           - Perform a colonoscopy today. ?Procedure Code(s):        --- Professional --- ?                          [phone], Esophagogastroduodenoscopy, flexible,  ?                          transoral; diagnostic, including collection of  ?                          specimen(s) by brushing or washing, when performed  ?                          (separate procedure) ?Diagnosis Code(s):        --- Professional --- ?                          K31.89, Other diseases of stomach and duodenum ?                          K92.2, Gastrointestinal hemorrhage, unspecified ?CPT copyright 2019 [employer]. All rights reserved. ?The codes documented in this report are preliminary and upon coder review may  ?be revised to meet  current compliance requirements. ?Otis Brace, MD ?Otis Brace, MD ?02/20/2022 1:37:55 PM ?Number of Addenda: 0 ?

## 2022-02-20 NOTE — Anesthesia Procedure Notes (Signed)
Procedure Name: Roy ?Date/Time: 02/20/2022 1:00 PM ?Performed by: Griffin Dakin, CRNA ?Pre-anesthesia Checklist: Patient identified, Emergency Drugs available, Suction available, Patient being monitored and Timeout performed ?Patient Re-evaluated:Patient Re-evaluated prior to induction ?Oxygen Delivery Method: Nasal cannula ?Induction Type: IV induction ?Placement Confirmation: positive ETCO2 and breath sounds checked- equal and bilateral ?Dental Injury: Teeth and Oropharynx as per pre-operative assessment  ? ? ? ? ?

## 2022-02-20 NOTE — Transfer of Care (Signed)
Immediate Anesthesia Transfer of Care Note ? ?Patient: [name] ? ?Procedure(s) Performed: COLONOSCOPY WITH PROPOFOL ?ESOPHAGOGASTRODUODENOSCOPY (EGD) ?POLYPECTOMY ? ?Patient Location: PACU ? ?Anesthesia Type:MAC ? ?Level of Consciousness: awake, alert  and oriented ? ?Airway & Oxygen Therapy: Patient Spontanous Breathing and Patient connected to nasal cannula oxygen ? ?Post-op Assessment: Report given to RN and Post -op Vital signs reviewed and stable ? ?Post vital signs: Reviewed and stable ? ?Last Vitals:  ?Vitals Value Taken Time  ?BP 102/54 02/20/22 1333  ?Temp 36.4 ?C 02/20/22 1333  ?Pulse 85 02/20/22 1337  ?Resp 14 02/20/22 1337  ?SpO2 100 % 02/20/22 1337  ?Vitals shown include unvalidated device data. ? ?Last Pain:  ?Vitals:  ? 02/20/22 1333  ?TempSrc:   ?PainSc: 0-No pain  ?   ? ?  ? ?Complications: No notable events documented. ?

## 2022-02-20 NOTE — Progress Notes (Signed)
?PROGRESS NOTE ? ? ? ?[name]  GMW:[id number] DOB: 1951/03/25 DOA: 02/18/2022 ?PCP: [name], MD  ? ? ?Brief Narrative:  ? ?[name] is a 71 year old female with past medical history significant for chronic anxiety/depression, osteoarthritis, fibromyalgia, asthma, type 2 diabetes mellitus, GERD, hyperlipidemia, essential hypertension, history of V-fib arrest secondary to saddle pulmonary embolism on Eliquis who presented to [facility] ED on 4/24 from home via EMS for blood in her stool.  Patient reports 2 large-volume bloody stools on day of admission.  Not associate with abdominal pain or tenderness.  Also denies fever/chills.  Complicated by need of anticoagulation with Eliquis for recent saddle PE February 2023. ? ?In the ED, temperature 98.1 ?F, HR 92, RR 16, BP 104 56, SPO2 99% on room air. WBC 6.6, hemoglobin 10.8, platelets 364.  Sodium 141, potassium 5.1, chloride 106, CO2 23, glucose 165, BUN 17, creatinine 1.93.  AST 21, ALT 12, total bilirubin 0.7.  Due to concern for lower GI bleed, likely diverticular, EDP requested admission.  Hospitalist service consulted for further evaluation management. ? ? ?Assessment & Plan: ?  ? ?Lower GI bleed ?Patient presenting to ED with 2 large-volume bright red bloody stools.  Complicated by use of anticoagulation with Eliquis. ?--[facility] following; appreciate assistance ?--Hgb 10.8>9.1>8.8 ?--Anemia panel: Pending ?--Continue monitor hemoglobin q12h ?--Transfuse for active bleeding or hemoglobin less than 7.0 ?--pending EGD/colonoscopy today ? ?History of V-fib arrest secondary to saddle pulmonary embolism on anticoagulation ?--Continue to hold home Eliquis ?--Monitor on telemetry ?--await further recommendations from GI on when to restart anticoagulation ? ?Mild hyperkalemia: Resolved ?Potassium 5.1 on admission.  Received Lokelma 10 g p.o. x1.  Potassium 4.1 this morning. ?--BMP in the a.m. ?--Monitor on telemetry ? ?Essential hypertension ?Home regimen  includes Toprol tartrate 12.5 mg p.o. twice daily. ?--Holding home antihypertensives with borderline hypotension ?--Continue monitor BP closely ? ?Anxiety/depression ?--Paxil 40 mg p.o. daily ? ?CKD stage IV ?Creatinine 1.93 on admission.  No other results in EMR/care everywhere for comparison. ?--Cr 1.93>2.00>1.54 ?-- IVF with LR at 50 mL/h ?--Avoid nephrotoxins, renal dose all medications ?--Repeat BMP in a.m. ? ?Type 2 diabetes mellitus ?Hemoglobin A1c 6.3 on 01/09/2022, well controlled. ?--SSI for coverage ?--CBGs qAC/HS ? ?Chronic pain/fibromyalgia: ?--Duloxetine 20 mg p.o. daily, lidocaine patch ? ?GERD: ?--Protonix 40 mg p.o. daily ? ?Physical deconditioning: ?--PT/OT recommend SNF, TOC for evaluation ? ? ? ? ?DVT prophylaxis:   Chemical DVT prophylaxis contraindicated in the setting of acute GI bleed ? ?  Code Status: Full Code ?Family Communication: No family present at bedside this morning ? ?Disposition Plan:  ?Level of care: Telemetry Medical ?Status is: Inpatient ?Remains inpatient appropriate because: Continues with bloody bowel movements, pending EGD/colonoscopy ? ? ?  ? ?Consultants:  ?[facility] ? ?Procedures:  ?EGD/colonoscopy: Pending ? ?Antimicrobials:  ?None ? ? ?Subjective: ?Patient seen examined at bedside, resting comfortably.  Just had another moderate bloody bowel movement this morning and getting cleaned up.  Continues to try to get down all of the colonoscopy preparation.  RN present at bedside.  No other questions or concerns at this time.  Denies headache, no dizziness, no fever/chills/night sweats, no nausea/vomiting/diarrhea, no chest pain, no palpitations, no shortness of breath, no abdominal pain, no weakness, no fatigue, no paresthesias.  No acute events overnight reported by nursing staff. ? ?Objective: ?Vitals:  ? 02/20/22 0323 02/20/22 0736 02/20/22 1113 02/20/22 1146  ?BP: 117/81 (!) 148/79 (!) 141/86 (!) 140/91  ?Pulse: 93 99 96 91  ?Resp:  $'13 19 12 13  'T$ ?Temp: 98.1 ?F (36.7 ?C) 98  ?F (36.7 ?C) 98.2 ?F (36.8 ?C) 98 ?F (36.7 ?C)  ?TempSrc: Oral Oral Oral Temporal  ?SpO2: 100% 100% 99% 100%  ? ? ?Intake/Output Summary (Last 24 hours) at 02/20/2022 1300 ?Last data filed at 02/20/2022 0400 ?Gross per 24 hour  ?Intake 1986.51 ml  ?Output --  ?Net 1986.51 ml  ? ?There were no vitals filed for this visit. ? ?Examination: ? ?Physical Exam: ?GEN: NAD, alert and oriented x 3, wd/wn ?HEENT: NCAT, PERRL, EOMI, sclera clear, MMM ?PULM: CTAB w/o wheezes/crackles, normal respiratory effort, on room air ?CV: RRR w/o M/G/R ?GI: abd soft, NTND, NABS, no R/G/M ?MSK: no peripheral edema, muscle strength globally intact 5/5 bilateral upper/lower extremities ?NEURO: CN II-XII intact, no focal deficits, sensation to light touch intact ?PSYCH: normal mood/affect ?Integumentary: dry/intact, no rashes or wounds ? ? ? ?Data Reviewed: I have personally reviewed following labs and imaging studies ? ?CBC: ?Recent Labs  ?Lab 02/18/22 ?1450 02/19/22 ?0402 02/19/22 ?2304 02/20/22 ?0938  ?WBC 6.6 7.0  --  6.6  ?NEUTROABS 4.0 3.6  --   --   ?HGB 10.8* 9.1* 8.8* 8.5*  ?HCT 33.9* 28.3* 27.4* 25.6*  ?MCV 86.9 87.1  --  84.8  ?PLT 364 309  --  288  ? ?Basic Metabolic Panel: ?Recent Labs  ?Lab 02/18/22 ?1450 02/19/22 ?0402 02/19/22 ?2304  ?NA 141 141 137  ?K 5.1 4.4 4.1  ?CL 106 110 106  ?CO2 '23 25 22  '$ ?GLUCOSE 165* 133* 141*  ?BUN '17 19 18  '$ ?CREATININE 1.93* 2.00* 1.59*  ?CALCIUM 9.7 9.2 9.0  ?MG  --  1.7  --   ?PHOS  --  3.4  --   ? ?GFR: ?Estimated Creatinine Clearance: 28.4 mL/min (A) (by C-G formula based on SCr of 1.59 mg/dL (H)). ?Liver Function Tests: ?Recent Labs  ?Lab 02/18/22 ?1450 02/19/22 ?0402  ?AST 21 17  ?ALT 12 13  ?ALKPHOS 58 50  ?BILITOT 0.7 0.7  ?PROT 6.7 6.1*  ?ALBUMIN 3.3* 2.9*  ? ?No results for input(s): LIPASE, AMYLASE in the last 168 hours. ?No results for input(s): AMMONIA in the last 168 hours. ?Coagulation Profile: ?No results for input(s): INR, PROTIME in the last 168 hours. ?Cardiac Enzymes: ?No results  for input(s): CKTOTAL, CKMB, CKMBINDEX, TROPONINI in the last 168 hours. ?BNP (last 3 results) ?No results for input(s): PROBNP in the last 8760 hours. ?HbA1C: ?No results for input(s): HGBA1C in the last 72 hours. ?CBG: ?Recent Labs  ?Lab 02/18/22 ?1920  ?GLUCAP 132*  ? ?Lipid Profile: ?No results for input(s): CHOL, HDL, LDLCALC, TRIG, CHOLHDL, LDLDIRECT in the last 72 hours. ?Thyroid Function Tests: ?No results for input(s): TSH, T4TOTAL, FREET4, T3FREE, THYROIDAB in the last 72 hours. ?Anemia Panel: ?Recent Labs  ?  02/19/22 ?1400  ?RETICCTPCT 1.1  ? ?Sepsis Labs: ?No results for input(s): PROCALCITON, LATICACIDVEN in the last 168 hours. ? ?Recent Results (from the past 240 hour(s))  ?MRSA Next Gen by PCR, Nasal     Status: None  ? Collection Time: 02/19/22  9:04 PM  ? Specimen: Nasal Mucosa; Nasal Swab  ?Result Value Ref Range Status  ? MRSA by PCR Next Gen NOT DETECTED NOT DETECTED Final  ?  Comment: (NOTE) ?The GeneXpert MRSA Assay (FDA approved for NASAL specimens only), ?is one component of a comprehensive MRSA colonization surveillance ?program. It is not intended to diagnose MRSA infection nor to guide ?or monitor treatment for MRSA infections. ?Test performance  is not FDA approved in patients less than 2 years ?old. ?Performed at [facility], [place] [address], [place], [place] ?[address] ?  ?  ? ? ? ? ? ?Radiology Studies: ?No results found. ? ? ? ? ? ?Scheduled Meds: ? [MAR Hold] atorvastatin  20 mg Oral Daily  ? [MAR Hold] dicyclomine  10 mg Oral TID AC  ? [MAR Hold] DULoxetine  20 mg Oral Daily  ? [MAR Hold] folic acid  2 mg Oral Daily  ? [MAR Hold] lidocaine  2 patch Transdermal Daily  ? [MAR Hold] pantoprazole  40 mg Oral BID  ? [MAR Hold] PARoxetine  40 mg Oral QHS  ? [MAR Hold] polyethylene glycol-electrolytes  4,000 mL Oral Once  ? [MAR Hold] sodium phosphate  1 enema Rectal Once  ? [MAR Hold] sodium zirconium cyclosilicate  10 g Oral Once  ? ?Continuous Infusions: ? sodium chloride    ?  lactated ringers 50 mL/hr at 02/20/22 2620  ? lactated ringers 20 mL/hr (02/20/22 1154)  ? ? ? LOS: 1 day  ? ? ?Time spent: 47 minutes spent on chart review, discussion with nursing staff, consultants, up

## 2022-02-20 NOTE — Op Note (Signed)
[facility] ?Patient Name: [name] ?Procedure Date : 02/20/2022 ?MRN: [id number] ?Attending MD: Otis Brace , MD ?Date of Birth: 1951-09-18 ?CSN: [id number] ?Age: 71 ?Admit Type: Inpatient ?Procedure:                Colonoscopy ?Indications:              Rectal bleeding ?Providers:                Otis Brace, MD, Jeanella Cara, RN,  ?                          Luan Moore, Technician, Viann Fish, CRNA ?Referring MD:              ?Medicines:                Sedation Administered by an Anesthesia Professional ?Complications:            No immediate complications. ?Estimated Blood Loss:     Estimated blood loss was minimal. ?Procedure:                Pre-Anesthesia Assessment: ?                          - Prior to the procedure, a History and Physical  ?                          was performed, and patient medications and  ?                          allergies were reviewed. The patient's tolerance of  ?                          previous anesthesia was also reviewed. The risks  ?                          and benefits of the procedure and the sedation  ?                          options and risks were discussed with the patient.  ?                          All questions were answered, and informed consent  ?                          was obtained. Prior Anticoagulants: The patient has  ?                          taken Eliquis (apixaban), last dose was 3 days  ?                          prior to procedure. ASA Grade Assessment: III - A  ?                          patient with severe systemic disease. After  ?  reviewing the risks and benefits, the patient was  ?                          deemed in satisfactory condition to undergo the  ?                          procedure. ?                          After obtaining informed consent, the colonoscope  ?                          was passed under direct vision. Throughout the  ?                          procedure,  the patient's blood pressure, pulse, and  ?                          oxygen saturations were monitored continuously. The  ?                          [id number]-HQ190L ([id number]) Olympus peds colonscope was  ?                          introduced through the anus and advanced to the the  ?                          terminal ileum, with identification of the  ?                          appendiceal orifice and IC valve. The colonoscopy  ?                          was performed without difficulty. The patient  ?                          tolerated the procedure well. The quality of the  ?                          bowel preparation was fair. ?Scope In: 1:11:10 PM ?Scope Out: 1:23:49 PM ?Scope Withdrawal Time: 0 hours 8 minutes 57 seconds  ?Total Procedure Duration: 0 hours 12 minutes 39 seconds  ?Findings: ?     Skin tags were found on perianal exam. ?     The terminal ileum appeared normal. ?     Multiple small and large-mouthed diverticula were found in the entire  ?     colon. ?     A 6 mm polyp was found in the descending colon. The polyp was sessile.  ?     The polyp was removed with a cold snare. Resection and retrieval were  ?     complete. ?     Internal hemorrhoids were found during retroflexion with streaks of  ?     blood. The hemorrhoids were medium-sized. ?Impression:               - Preparation of the  colon was fair. ?                          - Perianal skin tags found on perianal exam. ?                          - The examined portion of the ileum was normal. ?                          - Diverticulosis in the entire examined colon. ?                          - One 6 mm polyp in the descending colon, removed  ?                          with a cold snare. Resected and retrieved. ?                          - Internal hemorrhoids. ?Recommendation:           - Return patient to hospital ward for ongoing care. ?                          - Soft diet. ?                          - Continue present medications. ?                           - Await pathology results. ?                          - Repeat colonoscopy date to be determined after  ?                          pending pathology results are reviewed for  ?                          surveillance based on pathology results. ?Procedure Code(s):        --- Professional --- ?                          [id number], Colonoscopy, flexible; with removal of  ?                          tumor(s), polyp(s), or other lesion(s) by snare  ?                          technique ?Diagnosis Code(s):        --- Professional --- ?                          L54.4, Other hemorrhoids ?                          K63.5, Polyp of colon ?  K64.4, Residual hemorrhoidal skin tags ?                          K62.5, Hemorrhage of anus and rectum ?                          K57.30, Diverticulosis of large intestine without  ?                          perforation or abscess without bleeding ?CPT copyright 2019 [employer] Medical Association. All rights reserved. ?The codes documented in this report are preliminary and upon coder review may  ?be revised to meet current compliance requirements. ?Otis Brace, MD ?Otis Brace, MD ?02/20/2022 1:43:38 PM ?Number of Addenda: 0 ?

## 2022-02-20 NOTE — Progress Notes (Signed)
Pt was able to drink 1035m of the 40072mrequired for bowel prep. ?

## 2022-02-20 NOTE — Anesthesia Preprocedure Evaluation (Addendum)
Anesthesia Evaluation  ?Patient identified by MRN, date of birth, ID band ?Patient awake ? ? ? ?Reviewed: ?Allergy & Precautions, H&P , NPO status , Patient's Chart, lab work & pertinent test results, reviewed documented beta blocker date and time  ? ?History of Anesthesia Complications ?(+) PONV and history of anesthetic complications ? ?Airway ?Mallampati: III ? ?TM Distance: >3 FB ?Neck ROM: Full ? ? ? Dental ?no notable dental hx. ?(+) Partial Lower, Partial Upper, Dental Advisory Given ?  ?Pulmonary ?asthma , former smoker, PE ?  ?Pulmonary exam normal ?breath sounds clear to auscultation ? ? ? ? ? ? Cardiovascular ?hypertension, Pt. on medications and Pt. on home beta blockers ? ?Rhythm:Regular Rate:Normal ? ? ?  ?Neuro/Psych ?Anxiety Depression negative neurological ROS ?   ? GI/Hepatic ?Neg liver ROS, GERD  Medicated,  ?Endo/Other  ?diabetes ? Renal/GU ?Renal InsufficiencyRenal disease  ?negative genitourinary ?  ?Musculoskeletal ? ?(+) Arthritis , Osteoarthritis,  Fibromyalgia - ? Abdominal ?  ?Peds ? Hematology ? ?(+) Blood dyscrasia, anemia ,   ?Anesthesia Other Findings ? ? Reproductive/Obstetrics ?negative OB ROS ? ?  ? ? ? ? ? ? ? ? ? ? ? ? ? ?  ?  ? ? ? ? ? ? ?Anesthesia Physical ?Anesthesia Plan ? ?ASA: 3 ? ?Anesthesia Plan: MAC  ? ?Post-op Pain Management: Minimal or no pain anticipated  ? ?Induction: Intravenous ? ?PONV Risk Score and Plan: 3 and Propofol infusion and Treatment may vary due to age or medical condition ? ?Airway Management Planned: Natural Airway and Nasal Cannula ? ?Additional Equipment:  ? ?Intra-op Plan:  ? ?Post-operative Plan:  ? ?Informed Consent: I have reviewed the patients History and Physical, chart, labs and discussed the procedure including the risks, benefits and alternatives for the proposed anesthesia with the patient or authorized representative who has indicated his/her understanding and acceptance.  ? ? ? ?Dental advisory  given ? ?Plan Discussed with: CRNA ? ?Anesthesia Plan Comments:   ? ? ? ? ? ? ?Anesthesia Quick Evaluation ? ?

## 2022-02-20 NOTE — Anesthesia Postprocedure Evaluation (Signed)
Anesthesia Post Note ? ?Patient: [name] ? ?Procedure(s) Performed: COLONOSCOPY WITH PROPOFOL ?ESOPHAGOGASTRODUODENOSCOPY (EGD) ?POLYPECTOMY ? ?  ? ?Patient location during evaluation: Endoscopy ?Anesthesia Type: MAC ?Level of consciousness: awake and alert ?Pain management: pain level controlled ?Vital Signs Assessment: post-procedure vital signs reviewed and stable ?Respiratory status: spontaneous breathing, nonlabored ventilation and respiratory function stable ?Cardiovascular status: stable and blood pressure returned to baseline ?Postop Assessment: no apparent nausea or vomiting ?Anesthetic complications: no ? ? ?No notable events documented. ? ?Last Vitals:  ?Vitals:  ? 02/20/22 1347 02/20/22 1402  ?BP: 123/62 134/63  ?Pulse: 80 80  ?Resp: 17 15  ?Temp:    ?SpO2: 100% 99%  ?  ?Last Pain:  ?Vitals:  ? 02/20/22 1402  ?TempSrc:   ?PainSc: 0-No pain  ? ? ?  ?  ?  ?  ?  ?  ? ?Toretto Tingler,W. EDMOND ? ? ? ? ?

## 2022-02-20 NOTE — Progress Notes (Signed)
Inpatient Rehabilitation Admissions Coordinator  ? ?Patient inpt status therefore I will place rehab consult. ? ?Danne Baxter, RN, MSN ?Rehab Admissions Coordinator ?[phone] ?02/20/2022 8:10 AM ? ?

## 2022-02-21 ENCOUNTER — Encounter (HOSPITAL_COMMUNITY): Payer: Self-pay | Admitting: Gastroenterology

## 2022-02-21 DIAGNOSIS — I10 Essential (primary) hypertension: Secondary | ICD-10-CM

## 2022-02-21 DIAGNOSIS — I2602 Saddle embolus of pulmonary artery with acute cor pulmonale: Secondary | ICD-10-CM

## 2022-02-21 DIAGNOSIS — N184 Chronic kidney disease, stage 4 (severe): Secondary | ICD-10-CM | POA: Diagnosis not present

## 2022-02-21 DIAGNOSIS — M797 Fibromyalgia: Secondary | ICD-10-CM

## 2022-02-21 DIAGNOSIS — E1169 Type 2 diabetes mellitus with other specified complication: Secondary | ICD-10-CM

## 2022-02-21 DIAGNOSIS — Z794 Long term (current) use of insulin: Secondary | ICD-10-CM

## 2022-02-21 DIAGNOSIS — K922 Gastrointestinal hemorrhage, unspecified: Secondary | ICD-10-CM | POA: Diagnosis not present

## 2022-02-21 LAB — CBC
HCT: 24.6 % — ABNORMAL LOW (ref 36.0–46.0)
Hemoglobin: 8 g/dL — ABNORMAL LOW (ref 12.0–15.0)
MCH: 27.7 pg (ref 26.0–34.0)
MCHC: 32.5 g/dL (ref 30.0–36.0)
MCV: 85.1 fL (ref 80.0–100.0)
Platelets: 278 10*3/uL (ref 150–400)
RBC: 2.89 MIL/uL — ABNORMAL LOW (ref 3.87–5.11)
RDW: 12.6 % (ref 11.5–15.5)
WBC: 5.8 10*3/uL (ref 4.0–10.5)
nRBC: 0 % (ref 0.0–0.2)

## 2022-02-21 LAB — BASIC METABOLIC PANEL
Anion gap: 8 (ref 5–15)
BUN: 10 mg/dL (ref 8–23)
CO2: 25 mmol/L (ref 22–32)
Calcium: 9 mg/dL (ref 8.9–10.3)
Chloride: 106 mmol/L (ref 98–111)
Creatinine, Ser: 1.39 mg/dL — ABNORMAL HIGH (ref 0.44–1.00)
GFR, Estimated: 40 mL/min — ABNORMAL LOW (ref >60)
Glucose, Bld: 89 mg/dL (ref 70–99)
Potassium: 3.6 mmol/L (ref 3.5–5.1)
Sodium: 139 mmol/L (ref 135–145)

## 2022-02-21 LAB — SURGICAL PATHOLOGY

## 2022-02-21 MED ORDER — APIXABAN 5 MG PO TABS
5.0000 mg | ORAL_TABLET | Freq: Two times a day (BID) | ORAL | Status: DC
  Administered 2022-02-21 – 2022-02-23 (×5): 5 mg via ORAL
  Filled 2022-02-21 (×5): qty 1

## 2022-02-21 NOTE — Progress Notes (Signed)
Inpatient Rehab Admissions Coordinator:  ? ?Note updated PT recs to SNF.  Will not be able to get insurance approval with SNF recs, and would likely be difficult/impossible to get auth for CIR with diagnosis as well.  Will sign off at this time.  ? ?Shann Medal, PT, DPT ?Admissions Coordinator ?[phone] ?02/21/22  ?9:12 AM ? ?

## 2022-02-21 NOTE — Progress Notes (Signed)
Occupational Therapy Treatment ?Patient Details ?Name: [name] ?MRN: [id number] ?DOB: 09/20/1951 ?Today's Date: 02/21/2022 ? ? ?History of present illness Pt is a 71 y/o female presenting on 4/24 with hematochezia. Found with lower GI bleed. PMH includes: chronic anxiety/depression, arthrtis, fibromyalgia, type 2 DM, HTN, recent admissions for v-fib arrest due to saddle pulmonary embolism and hypoglycemia. ?  ?OT comments ? Patient supine in bed and requires max encouragement to engage in OT session.  Patient completes bed mobility with supervision but requires mod assist for sit to stand.  Constant cueing for hand placement and safety, mod assist in standing due to posterior lean. Pt with poor awareness of posterior lean with no righting reaction noted.  Patient with HR up to 143 standing at EOB.  Pt with limited tolerance, returned to supine to rest.  Continue to recommend SNF.  Will follow.   ? ?Recommendations for follow up therapy are one component of a multi-disciplinary discharge planning process, led by the attending physician.  Recommendations may be updated based on patient status, additional functional criteria and insurance authorization. ?   ?Follow Up Recommendations ? Skilled nursing-short term rehab (<3 hours/day)  ?  ?Assistance Recommended at Discharge Frequent or constant Supervision/Assistance  ?Patient can return home with the following ? Two people to help with walking and/or transfers;A lot of help with bathing/dressing/bathroom;Assistance with cooking/housework;Direct supervision/assist for medications management;Direct supervision/assist for financial management;Assist for transportation;Help with stairs or ramp for entrance ?  ?Equipment Recommendations ? None recommended by OT (has needed DME)  ?  ?Recommendations for Other Services   ? ?  ?Precautions / Restrictions Precautions ?Precautions: Fall ?Precaution Comments: watch HR ?Restrictions ?Weight Bearing Restrictions: No  ? ? ?   ? ?Mobility Bed Mobility ?Overal bed mobility: Needs Assistance ?Bed Mobility: Supine to Sit, Sit to Supine ?  ?  ?Supine to sit: Supervision ?Sit to supine: Supervision ?  ?General bed mobility comments: increased time, but no physical assist required ?  ? ?Transfers ?  ?  ?  ?  ?  ?  ?  ?  ?  ?  ?  ?  ?Balance   ?  ?  ?  ?  ?  ?  ?  ?  ?  ?  ?  ?  ?  ?  ?  ?  ?  ?  ?   ? ?ADL either performed or assessed with clinical judgement  ? ?ADL Overall ADL's : Needs assistance/impaired ?  ?  ?Grooming: Set up;Sitting ?  ?  ?  ?  ?  ?  ?  ?Lower Body Dressing: Total assistance;+2 for physical assistance;+2 for safety/equipment;Sit to/from stand ?  ?  ?Toilet Transfer Details (indicate cue type and reason): mod assist sit to stand from EOB, cueing for hand placement and safety. posterior lean. ?  ?  ?  ?  ?Functional mobility during ADLs: Moderate assistance;Rolling walker (2 wheels);Cueing for sequencing;Cueing for safety ?General ADL Comments: limited by elevated HR in standing ?  ? ?Extremity/Trunk Assessment   ?  ?  ?  ?  ?  ? ?Vision   ?  ?  ?Perception   ?  ?Praxis   ?  ? ?Cognition Arousal/Alertness: Awake/alert ?Behavior During Therapy: Impulsive ?Overall Cognitive Status: Impaired/Different from baseline ?Area of Impairment: Orientation, Attention, Memory, Following commands, Safety/judgement, Awareness, Problem solving ?  ?  ?  ?  ?  ?  ?  ?  ?Orientation Level: Disoriented to, Time (year) ?Current Attention Level:  Sustained ?Memory: Decreased recall of precautions, Decreased short-term memory ?Following Commands: Follows one step commands consistently, Follows one step commands with increased time ?Safety/Judgement: Decreased awareness of safety, Decreased awareness of deficits ?Awareness: Intellectual ?Problem Solving: Slow processing, Decreased initiation, Difficulty sequencing, Requires verbal cues, Requires tactile cues ?  ?  ?  ?   ?Exercises   ? ?  ?Shoulder Instructions   ? ? ?  ?General Comments HR up to  143 with standing at EOB  ? ? ?Pertinent Vitals/ Pain       Pain Assessment ?Pain Assessment: Faces ?Faces Pain Scale: Hurts little more ?Pain Location: R hip ?Pain Descriptors / Indicators: Sore ?Pain Intervention(s): Limited activity within patient's tolerance, Monitored during session, Repositioned ? ?Home Living   ?  ?  ?  ?  ?  ?  ?  ?  ?  ?  ?  ?  ?  ?  ?  ?  ?  ?  ? ?  ?Prior Functioning/Environment    ?  ?  ?  ?   ? ?Frequency ? Min 2X/week  ? ? ? ? ?  ?Progress Toward Goals ? ?OT Goals(current goals can now be found in the care plan section) ? Progress towards OT goals: Progressing toward goals ? ?Acute Rehab OT Goals ?Patient Stated Goal: get better ?OT Goal Formulation: With patient ?Time For Goal Achievement: 03/05/22 ?Potential to Achieve Goals: Good  ?Plan Discharge plan remains appropriate;Frequency remains appropriate   ? ?Co-evaluation ? ? ?   ?  ?  ?  ?  ? ?  ?AM-PAC OT "6 Clicks" Daily Activity     ?Outcome Measure ? ? Help from another person eating meals?: A Little ?Help from another person taking care of personal grooming?: A Little ?Help from another person toileting, which includes using toliet, bedpan, or urinal?: A Lot ?Help from another person bathing (including washing, rinsing, drying)?: A Lot ?Help from another person to put on and taking off regular upper body clothing?: A Little ?Help from another person to put on and taking off regular lower body clothing?: Total ?6 Click Score: 14 ? ?  ?End of Session Equipment Utilized During Treatment: Gait belt;Rolling walker (2 wheels) ? ?OT Visit Diagnosis: Other abnormalities of gait and mobility (R26.89);Muscle weakness (generalized) (M62.81);Other symptoms and signs involving cognitive function ?  ?Activity Tolerance Patient tolerated treatment well ?  ?Patient Left in bed;with call bell/phone within reach;with bed alarm set;with family/visitor present ?  ?Nurse Communication Mobility status ?  ? ?   ? ?Time: 9485-4627 ?OT Time Calculation  (min): 29 min ? ?Charges: OT General Charges ?$OT Visit: 1 Visit ?OT Treatments ?$Self Care/Home Management : 23-37 mins ? ?Jolaine Artist, OT ?Acute Rehabilitation Services ?Pager [phone] ?Office [phone] ? ? ?Delight Stare ?02/21/2022, 12:32 PM ?

## 2022-02-21 NOTE — TOC Initial Note (Signed)
Transition of Care (TOC) - Initial/Assessment Note  ? ? ?Patient Details  ?Name: [name] ?MRN: [id number] ?Date of Birth: [birth year] ? ?Transition of Care (TOC) CM/SW Contact:    ?Vinie Sill, LCSW ?Phone Number: ?02/21/2022, 11:49 AM ? ?Clinical Narrative:                 ? ?CSW visit with patient- she was working with PT, CSW will return. ? ?CSW spoke with patient's daughter [name]. CSW introduced self and explained role. [name] confirmed patient live [place] home with her. CSW discussed therapy recommendation of short term rehab at [facility]. [name] states  patient was at [facility] last month for almost a month. CSW informed of possible co-pays that maybe required if patient discharge to SNF. CSW explained the SNF process.  ? ?TOC will provide bed offers and confirm if in co-pay status once available. ?TOC  Will continue to follow and assist with discharge planning.  ? ?Thurmond Butts, MSW, LCSW ?Clinical Social Worker ? ? ? ?Expected Discharge Plan: [place] ?Barriers to Discharge: Ship broker, SNF Pending bed offer (maybe in co-pay staus for SNF) ? ? ?Patient Goals and CMS Choice ?  ?  ?  ? ?Expected Discharge Plan and Services ?Expected Discharge Plan: [place] ?In-house Referral: Clinical Social Work ?  ?  ?Living arrangements for the past 2 months: [place] ?                ?  ?  ?  ?  ?  ?  ?  ?  ?  ?  ? ?Prior Living Arrangements/Services ?Living arrangements for the past 2 months: [place] ?Lives with:: Self, Relatives ?  ?       ?Need for Family Participation in Patient Care: Yes (Comment) ?Care giver support system in place?: Yes (comment) ?  ?Criminal Activity/Legal Involvement Pertinent to Current Situation/Hospitalization: No - Comment as needed ? ?Activities of Daily Living ?  ?  ? ?Permission Sought/Granted ?  ?  ?   ?   ?   ?   ? ?Emotional Assessment ?  ?Attitude/Demeanor/Rapport: Engaged ?Affect (typically observed):  Pleasant ?Orientation: : Oriented to Self, Oriented to Place, Oriented to  Time, Oriented to Situation ?Alcohol / Substance Use: Not Applicable ?Psych Involvement: No (comment) ? ?Admission diagnosis:  GI bleed [K92.2] ?Acute GI bleeding [K92.2] ?Anticoagulated [Z79.01] ?Patient Active Problem List  ? Diagnosis Date Noted  ? GI bleed 02/18/2022  ? Bacteremia due to Enterococcus 02/15/2022  ? Medication monitoring encounter 02/15/2022  ? Pain due to onychomycosis of toenails of both feet 02/06/2022  ? Adult failure to thrive syndrome 01/22/2022  ? History of anemia due to chronic kidney disease 01/17/2022  ? Neurocognitive deficits 01/17/2022  ? Pressure injury of skin 01/11/2022  ? History of pulmonary embolism 01/10/2022  ? Hypoglycemia 01/09/2022  ? CKD (chronic kidney disease) stage 4, GFR 15-29 ml/min (HCC) 01/09/2022  ? Syncope 01/09/2022  ? Essential hypertension   ? Debility 12/04/2021  ? Anoxia of brain ([place]) 12/04/2021  ? Acute on chronic renal failure ([place]) 11/12/2021  ? Leukocytosis   ? Thrombocytopenia ([place])   ? Aspiration pneumonia of both lower lobes ([place])   ? Hemodialysis-associated hypotension   ? Acute respiratory failure (HCC) due to recurrent aspiration Pneumonia and mucous plugging/atelectasis    ? Acute saddle pulmonary embolism with acute cor pulmonale (HCC)   ? Protein-calorie malnutrition, severe 11/08/2021  ? Cardiac arrest ([place]) 10/28/2021  ?  Adjustment disorder with depressed mood 04/10/2021  ? History of infectious disease 04/10/2021  ? Affective psychosis ([place]) 11/21/2020  ? Allergic rhinitis due to pollen 11/21/2020  ? Anxiety 11/21/2020  ? Chronic kidney disease, stage 2 (mild) 11/21/2020  ? Diabetic renal disease ([place]) 11/21/2020  ? Diverticular disease of colon 11/21/2020  ? Eczema 11/21/2020  ? Gastroesophageal reflux disease 11/21/2020  ? Gout 11/21/2020  ? Joint pain 11/21/2020  ? Lymphedema 11/21/2020  ? Mild intermittent asthma 11/21/2020  ? Moderate major depression, single  episode ([place]) 11/21/2020  ? Neuropathy 11/21/2020  ? Type 2 diabetes mellitus with other specified complication ([place]) 13/24/4010  ? Pure hypercholesterolemia 11/21/2020  ? Vitamin D deficiency 11/21/2020  ? Fibromyalgia 09/04/2017  ? Insomnia 09/04/2017  ? DDD (degenerative disc disease), cervical 09/04/2017  ? DDD (degenerative disc disease), lumbar 09/04/2017  ? Primary osteoarthritis of both hands 09/04/2017  ? Primary osteoarthritis of both knees 09/04/2017  ? History of rotator cuff tear 09/04/2017  ? Osteopenia of multiple sites 09/04/2017  ? ?PCP:  [name], MD ?Pharmacy:   ?[id number] - [place], [address] ?[place] [address] ?Phone: [phone] Fax: [phone] ? ?[facility] ?[address] ?[place] [address] ?Phone: [phone] Fax: [phone] ? ? ? ? ?Social Determinants of Health (SDOH) Interventions ?  ? ?Readmission Risk Interventions ? ?  01/10/2022  ?  9:45 AM 12/04/2021  ?  4:26 PM 11/02/2021  ?  2:45 PM  ?Readmission Risk Prevention Plan  ?Transportation Screening Complete Complete Complete  ?PCP or Specialist Appt within 3-5 Days Complete Not Complete   ?Not Complete comments  INPT rehab   ?[place] or Home Care Consult Complete Complete   ?Social Work Consult for [place] Planning/Counseling Complete Complete   ?Palliative Care Screening Complete Complete   ?Medication Review Press photographer) Complete Complete   ? ? ? ?

## 2022-02-21 NOTE — Progress Notes (Signed)
?PROGRESS NOTE ? ? ? ?[name]  OIZ:[id number] DOB: 01/15/1951 DOA: 02/18/2022 ?PCP: [name], MD  ? ? ?Brief Narrative:  ? ?[name] is a 71 year old female with past medical history significant for chronic anxiety/depression, osteoarthritis, fibromyalgia, asthma, type 2 diabetes mellitus, GERD, hyperlipidemia, essential hypertension, history of V-fib arrest secondary to saddle pulmonary embolism on Eliquis who presented to [facility] ED on 4/24 from home via EMS for blood in her stool.  Patient reports 2 large-volume bloody stools on day of admission.  Not associate with abdominal pain or tenderness.  Also denies fever/chills.  Complicated by need of anticoagulation with Eliquis for recent saddle PE February 2023. ? ?In the ED, temperature 98.1 ?F, HR 92, RR 16, BP 104 56, SPO2 99% on room air. WBC 6.6, hemoglobin 10.8, platelets 364.  Sodium 141, potassium 5.1, chloride 106, CO2 23, glucose 165, BUN 17, creatinine 1.93.  AST 21, ALT 12, total bilirubin 0.7.  Due to concern for lower GI bleed, likely diverticular, EDP requested admission.  Hospitalist service consulted for further evaluation management. ? ? ?Assessment & Plan: ?  ? ?Lower GI bleed 2/2 internal hemorrhoids ?Patient presenting to ED with 2 large-volume bright red bloody stools.  Complicated by use of anticoagulation with Eliquis.  Why gastroenterology was consulted and followed during the hospital course.  Underwent EGD and colonoscopy on 02/20/2022 with findings of mild edema in the stomach, pandiverticulosis, small polyp descending colon removed with cold snare and medium size internal hemorrhoids with streaks of blood on the hemorrhoidal tissue. ?--[facility] following; appreciate assistance ?--Hgb 10.8>9.1>8.8>8.5>9.1>8.0 ?--Surgical pathology: Pending ?--Anemia panel: Pending ?--Anusol suppository BID x 14 days ?--CBC daily ?--Transfuse for active bleeding or hemoglobin less than 7.0 ? ?History of V-fib arrest secondary to saddle  pulmonary embolism on anticoagulation ?--restart Eliquis today ?--Monitor on telemetry ?--await further recommendations from GI on when to restart anticoagulation ? ?Mild hyperkalemia: Resolved ?Potassium 5.1 on admission.  Received Lokelma 10 g p.o. x1.  Potassium 3.6 this morning. ?--BMP in the a.m. ?--Monitor on telemetry ? ?Essential hypertension ?Home regimen includes Toprol tartrate 12.5 mg p.o. twice daily. ?--Holding home antihypertensives with borderline hypotension ?--Continue monitor BP closely ? ?Anxiety/depression ?--Paxil 40 mg p.o. daily ? ?CKD stage IV ?Creatinine 1.93 on admission.  No other results in EMR/care everywhere for comparison. ?--Cr 1.93>2.00>1.54>1.39 ?--Avoid nephrotoxins, renal dose all medications ?--Repeat BMP in a.m. ? ?Type 2 diabetes mellitus ?Hemoglobin A1c 6.3 on 01/09/2022, well controlled. ?--SSI for coverage ?--CBGs qAC/HS ? ?Chronic pain/fibromyalgia: ?--Duloxetine 20 mg p.o. daily, lidocaine patch ? ?GERD: ?--Protonix 40 mg p.o. daily ? ?Physical deconditioning: ?--PT/OT recommend SNF, TOC for evaluation ? ? ? ? ?DVT prophylaxis:   apixaban (ELIQUIS) tablet 5 mg  ?  Code Status: Full Code ?Family Communication: Updated family present at bedside this morning ? ?Disposition Plan:  ?Level of care: Telemetry Medical ?Status is: Inpatient ?Remains inpatient appropriate because: Awaiting SNF placement ? ? ?  ? ?Consultants:  ?[facility] ? ?Procedures:  ?EGD/colonoscopy 4/26 ? ?Antimicrobials:  ?None ? ? ?Subjective: ?Patient seen examined at bedside, resting comfortably.  Sleeping but easily arousable.  Daughter present at bedside.  Once again discussed findings of EGD colonoscopy yesterday, and continue treatment with Anusol suppositories for internal hemorrhoids likely source of bleeding.  Awaiting SNF placement.  No other questions or concerns at this time.  Denies headache, no dizziness, no fever/chills/night sweats, no nausea/vomiting/diarrhea, no chest pain, no palpitations,  no shortness of breath, no abdominal pain, no weakness, no  fatigue, no paresthesias.  No acute events overnight reported by nursing staff. ? ?Objective: ?Vitals:  ? 02/20/22 2333 02/21/22 0322 02/21/22 0740 02/21/22 1101  ?BP: (!) 109/59 (!) 116/57 (!) 106/54 126/65  ?Pulse: 91 89 90 90  ?Resp: '17 14 16 16  '$ ?Temp: 98.8 ?F (37.1 ?C) 98.5 ?F (36.9 ?C) 98.5 ?F (36.9 ?C) 98.4 ?F (36.9 ?C)  ?TempSrc: Oral Oral Oral Oral  ?SpO2: 100% 100% 100% 99%  ? ? ?Intake/Output Summary (Last 24 hours) at 02/21/2022 1123 ?Last data filed at 02/20/2022 1523 ?Gross per 24 hour  ?Intake --  ?Output 400 ml  ?Net -400 ml  ? ?There were no vitals filed for this visit. ? ?Examination: ? ?Physical Exam: ?GEN: NAD, alert and oriented x 3, wd/wn ?HEENT: NCAT, PERRL, EOMI, sclera clear, MMM ?PULM: CTAB w/o wheezes/crackles, normal respiratory effort, on room air ?CV: RRR w/o M/G/R ?GI: abd soft, NTND, NABS, no R/G/M ?MSK: no peripheral edema, muscle strength globally intact 5/5 bilateral upper/lower extremities ?NEURO: CN II-XII intact, no focal deficits, sensation to light touch intact ?PSYCH: normal mood/affect ?Integumentary: dry/intact, no rashes or wounds ? ? ? ?Data Reviewed: I have personally reviewed following labs and imaging studies ? ?CBC: ?Recent Labs  ?Lab 02/18/22 ?1450 02/19/22 ?0402 02/19/22 ?2304 02/20/22 ?3419 02/20/22 ?1812 02/21/22 ?6222  ?WBC 6.6 7.0  --  6.6  --  5.8  ?NEUTROABS 4.0 3.6  --   --   --   --   ?HGB 10.8* 9.1* 8.8* 8.5* 9.1* 8.0*  ?HCT 33.9* 28.3* 27.4* 25.6* 28.3* 24.6*  ?MCV 86.9 87.1  --  84.8  --  85.1  ?PLT 364 309  --  288  --  278  ? ?Basic Metabolic Panel: ?Recent Labs  ?Lab 02/18/22 ?1450 02/19/22 ?0402 02/19/22 ?2304 02/21/22 ?9798  ?NA 141 141 137 139  ?K 5.1 4.4 4.1 3.6  ?CL 106 110 106 106  ?CO2 '23 25 22 25  '$ ?GLUCOSE 165* 133* 141* 89  ?BUN '17 19 18 10  '$ ?CREATININE 1.93* 2.00* 1.59* 1.39*  ?CALCIUM 9.7 9.2 9.0 9.0  ?MG  --  1.7  --   --   ?PHOS  --  3.4  --   --   ? ?GFR: ?Estimated Creatinine  Clearance: 32.4 mL/min (A) (by C-G formula based on SCr of 1.39 mg/dL (H)). ?Liver Function Tests: ?Recent Labs  ?Lab 02/18/22 ?1450 02/19/22 ?0402  ?AST 21 17  ?ALT 12 13  ?ALKPHOS 58 50  ?BILITOT 0.7 0.7  ?PROT 6.7 6.1*  ?ALBUMIN 3.3* 2.9*  ? ?No results for input(s): LIPASE, AMYLASE in the last 168 hours. ?No results for input(s): AMMONIA in the last 168 hours. ?Coagulation Profile: ?No results for input(s): INR, PROTIME in the last 168 hours. ?Cardiac Enzymes: ?No results for input(s): CKTOTAL, CKMB, CKMBINDEX, TROPONINI in the last 168 hours. ?BNP (last 3 results) ?No results for input(s): PROBNP in the last 8760 hours. ?HbA1C: ?No results for input(s): HGBA1C in the last 72 hours. ?CBG: ?Recent Labs  ?Lab 02/18/22 ?1920 02/20/22 ?1333  ?GLUCAP 132* 140*  ? ?Lipid Profile: ?No results for input(s): CHOL, HDL, LDLCALC, TRIG, CHOLHDL, LDLDIRECT in the last 72 hours. ?Thyroid Function Tests: ?No results for input(s): TSH, T4TOTAL, FREET4, T3FREE, THYROIDAB in the last 72 hours. ?Anemia Panel: ?Recent Labs  ?  02/19/22 ?1400  ?RETICCTPCT 1.1  ? ?Sepsis Labs: ?No results for input(s): PROCALCITON, LATICACIDVEN in the last 168 hours. ? ?Recent Results (from the past 240 hour(s))  ?MRSA Next Gen  by PCR, Nasal     Status: None  ? Collection Time: 02/19/22  9:04 PM  ? Specimen: Nasal Mucosa; Nasal Swab  ?Result Value Ref Range Status  ? MRSA by PCR Next Gen NOT DETECTED NOT DETECTED Final  ?  Comment: (NOTE) ?The GeneXpert MRSA Assay (FDA approved for NASAL specimens only), ?is one component of a comprehensive MRSA colonization surveillance ?program. It is not intended to diagnose MRSA infection nor to guide ?or monitor treatment for MRSA infections. ?Test performance is not FDA approved in patients less than 2 years ?old. ?Performed at [facility], [place] [address], [place], [place] ?[address] ?  ?  ? ? ? ? ? ?Radiology Studies: ?No results found. ? ? ? ? ? ?Scheduled Meds: ? apixaban  5 mg Oral BID  ?  atorvastatin  20 mg Oral Daily  ? dicyclomine  10 mg Oral TID AC  ? DULoxetine  20 mg Oral Daily  ? folic acid  2 mg Oral Daily  ? hydrocortisone  25 mg Rectal BID  ? lidocaine  2 patch Transdermal Daily  ? pantoprazole

## 2022-02-22 DIAGNOSIS — N184 Chronic kidney disease, stage 4 (severe): Secondary | ICD-10-CM | POA: Diagnosis not present

## 2022-02-22 DIAGNOSIS — I2602 Saddle embolus of pulmonary artery with acute cor pulmonale: Secondary | ICD-10-CM | POA: Diagnosis not present

## 2022-02-22 DIAGNOSIS — K922 Gastrointestinal hemorrhage, unspecified: Secondary | ICD-10-CM | POA: Diagnosis not present

## 2022-02-22 DIAGNOSIS — I10 Essential (primary) hypertension: Secondary | ICD-10-CM | POA: Diagnosis not present

## 2022-02-22 LAB — IRON AND TIBC
Iron: 35 ug/dL (ref 28–170)
Saturation Ratios: 26 % (ref 10.4–31.8)
TIBC: 134 ug/dL — ABNORMAL LOW (ref 250–450)
UIBC: 99 ug/dL

## 2022-02-22 LAB — BASIC METABOLIC PANEL
Anion gap: 6 (ref 5–15)
BUN: 6 mg/dL — ABNORMAL LOW (ref 8–23)
CO2: 26 mmol/L (ref 22–32)
Calcium: 9 mg/dL (ref 8.9–10.3)
Chloride: 106 mmol/L (ref 98–111)
Creatinine, Ser: 1.49 mg/dL — ABNORMAL HIGH (ref 0.44–1.00)
GFR, Estimated: 37 mL/min — ABNORMAL LOW (ref >60)
Glucose, Bld: 151 mg/dL — ABNORMAL HIGH (ref 70–99)
Potassium: 3.3 mmol/L — ABNORMAL LOW (ref 3.5–5.1)
Sodium: 138 mmol/L (ref 135–145)

## 2022-02-22 LAB — CBC
HCT: 26.3 % — ABNORMAL LOW (ref 36.0–46.0)
Hemoglobin: 8.7 g/dL — ABNORMAL LOW (ref 12.0–15.0)
MCH: 27.8 pg (ref 26.0–34.0)
MCHC: 33.1 g/dL (ref 30.0–36.0)
MCV: 84 fL (ref 80.0–100.0)
Platelets: 293 10*3/uL (ref 150–400)
RBC: 3.13 MIL/uL — ABNORMAL LOW (ref 3.87–5.11)
RDW: 12.6 % (ref 11.5–15.5)
WBC: 5.9 10*3/uL (ref 4.0–10.5)
nRBC: 0 % (ref 0.0–0.2)

## 2022-02-22 LAB — VITAMIN B12: Vitamin B-12: 1448 pg/mL — ABNORMAL HIGH (ref 180–914)

## 2022-02-22 LAB — FOLATE: Folate: >40 ng/mL (ref >5.9)

## 2022-02-22 LAB — RETICULOCYTES
Immature Retic Fract: 14.3 % (ref 2.3–15.9)
RBC.: 3.18 MIL/uL — ABNORMAL LOW (ref 3.87–5.11)
Retic Count, Absolute: 40.1 10*3/uL (ref 19.0–186.0)
Retic Ct Pct: 1.3 % (ref 0.4–3.1)

## 2022-02-22 LAB — FERRITIN: Ferritin: 1022 ng/mL — ABNORMAL HIGH (ref 11–307)

## 2022-02-22 MED ORDER — POTASSIUM CHLORIDE CRYS ER 20 MEQ PO TBCR
30.0000 meq | EXTENDED_RELEASE_TABLET | ORAL | Status: AC
  Administered 2022-02-22 (×2): 30 meq via ORAL
  Filled 2022-02-22 (×2): qty 1

## 2022-02-22 NOTE — Progress Notes (Signed)
?PROGRESS NOTE ? ? ? ?[name]  LPF:[id number] DOB: Sep 03, 1951 DOA: 02/18/2022 ?PCP: [name], MD  ? ? ?Brief Narrative:  ? ?[name] is a 71 year old female with past medical history significant for chronic anxiety/depression, osteoarthritis, fibromyalgia, asthma, type 2 diabetes mellitus, GERD, hyperlipidemia, essential hypertension, history of V-fib arrest secondary to saddle pulmonary embolism on Eliquis who presented to [facility] ED on 4/24 from home via EMS for blood in her stool.  Patient reports 2 large-volume bloody stools on day of admission.  Not associate with abdominal pain or tenderness.  Also denies fever/chills.  Complicated by need of anticoagulation with Eliquis for recent saddle PE February 2023. ? ?In the ED, temperature 98.1 ?F, HR 92, RR 16, BP 104 56, SPO2 99% on room air. WBC 6.6, hemoglobin 10.8, platelets 364.  Sodium 141, potassium 5.1, chloride 106, CO2 23, glucose 165, BUN 17, creatinine 1.93.  AST 21, ALT 12, total bilirubin 0.7.  Due to concern for lower GI bleed, likely diverticular, EDP requested admission.  Hospitalist service consulted for further evaluation management. ? ? ?Assessment & Plan: ?  ? ?Lower GI bleed 2/2 internal hemorrhoids ?Patient presenting to ED with 2 large-volume bright red bloody stools.  Complicated by use of anticoagulation with Eliquis.  [facility] gastroenterology was consulted and followed during the hospital course.  Underwent EGD and colonoscopy on 02/20/2022 with findings of mild edema in the stomach, pandiverticulosis, small polyp descending colon removed with cold snare and medium size internal hemorrhoids with streaks of blood on the hemorrhoidal tissue.  EMEA panel with iron 35, TIBC 134, ferritin 1022 and folate greater than 40. ?--[facility] following; appreciate assistance ?--Hgb 10.8>9.1>8.8>8.5>9.1>8.0>8.7 ?--Surgical pathology: Pending ?--Anusol suppository BID x 14 days ?--CBC daily ?--Transfuse for active bleeding or hemoglobin less  than 7.0 ? ?History of V-fib arrest secondary to saddle pulmonary embolism on anticoagulation ?--restarted Eliquis 4/27 ?--Monitor on telemetry ? ?Mild hyperkalemia: Resolved ?Hypokalemia ?Potassium 5.1 on admission. Potassium 3.3 this morning. ?--Will replete K today. ?--BMP in the a.m. ?--Monitor on telemetry ? ?Essential hypertension ?Home regimen includes Toprol tartrate 12.5 mg p.o. twice daily. ?--Holding home antihypertensives with borderline hypotension ?--Continue monitor BP closely ? ?Anxiety/depression ?--Paxil 40 mg p.o. daily ? ?CKD stage IV ?Creatinine 1.93 on admission.  No other results in EMR/care everywhere for comparison. ?--Cr 1.93>2.00>1.54>1.39>1.49 ?--Avoid nephrotoxins, renal dose all medications ?--Repeat BMP in a.m. ? ?Type 2 diabetes mellitus ?Hemoglobin A1c 6.3 on 01/09/2022, well controlled. ?--SSI for coverage ?--CBGs qAC/HS ? ?Chronic pain/fibromyalgia: ?--Duloxetine 20 mg p.o. daily, lidocaine patch ? ?GERD: ?--Protonix 40 mg p.o. daily ? ?Physical deconditioning: ?--PT/OT recommend SNF, TOC for evaluation ? ? ? ? ?DVT prophylaxis:   apixaban (ELIQUIS) tablet 5 mg  ?  Code Status: Full Code ?Family Communication: No family present at bedside this morning, updated patient's sister extensively yesterday afternoon ? ?Disposition Plan:  ?Level of care: Telemetry Medical ?Status is: Inpatient ?Remains inpatient appropriate because: Awaiting SNF placement ? ? ?  ? ?Consultants:  ?[facility] ? ?Procedures:  ?EGD/colonoscopy 4/26 ? ?Antimicrobials:  ?None ? ? ?Subjective: ?Patient seen examined at bedside, resting comfortably.  Sleeping but easily arousable.  No family present.  Continues with intermittent confusion, which is baseline per family given multiple recent hospitalizations and rehab stay.  Hemoglobin improved.  No questions or concerns at this time. ?Denies headache, no dizziness, no fever/chills/night sweats, no nausea/vomiting/diarrhea, no chest pain, no palpitations, no shortness  of breath, no abdominal pain, no weakness, no fatigue, no paresthesias.  No acute events overnight reported by nursing staff. ? ?Objective: ?Vitals:  ? 02/21/22 2301 02/22/22 0304 02/22/22 7829 02/22/22 0809  ?BP: (!) 151/87 120/82  130/73  ?Pulse: (!) 106 96 96 99  ?Resp: '15 14 16 13  '$ ?Temp: 99 ?F (37.2 ?C) 98.6 ?F (37 ?C)  98.5 ?F (36.9 ?C)  ?TempSrc: Oral Oral  Oral  ?SpO2: 100% 100% 96% 99%  ? ? ?Intake/Output Summary (Last 24 hours) at 02/22/2022 1027 ?Last data filed at 02/22/2022 1019 ?Gross per 24 hour  ?Intake 640 ml  ?Output --  ?Net 640 ml  ? ?There were no vitals filed for this visit. ? ?Examination: ? ?Physical Exam: ?GEN: NAD, alert, oriented to person/time, but not place or situation, chronically ill appearance ?HEENT: NCAT, PERRL, EOMI, sclera clear, MMM ?PULM: CTAB w/o wheezes/crackles, normal respiratory effort, on room air ?CV: RRR w/o M/G/R ?GI: abd soft, NTND, NABS, no R/G/M ?MSK: no peripheral edema, muscle strength globally intact 5/5 bilateral upper/lower extremities ?NEURO: CN II-XII intact, no focal deficits, sensation to light touch intact ?PSYCH: normal mood/affect ?Integumentary: dry/intact, no rashes or wounds ? ? ? ?Data Reviewed: I have personally reviewed following labs and imaging studies ? ?CBC: ?Recent Labs  ?Lab 02/18/22 ?1450 02/19/22 ?0402 02/19/22 ?2304 02/20/22 ?5621 02/20/22 ?1812 02/21/22 ?3086 02/22/22 ?0418  ?WBC 6.6 7.0  --  6.6  --  5.8 5.9  ?NEUTROABS 4.0 3.6  --   --   --   --   --   ?HGB 10.8* 9.1* 8.8* 8.5* 9.1* 8.0* 8.7*  ?HCT 33.9* 28.3* 27.4* 25.6* 28.3* 24.6* 26.3*  ?MCV 86.9 87.1  --  84.8  --  85.1 84.0  ?PLT 364 309  --  288  --  278 293  ? ?Basic Metabolic Panel: ?Recent Labs  ?Lab 02/18/22 ?1450 02/19/22 ?0402 02/19/22 ?2304 02/21/22 ?5784 02/22/22 ?0418  ?NA 141 141 137 139 138  ?K 5.1 4.4 4.1 3.6 3.3*  ?CL 106 110 106 106 106  ?CO2 '23 25 22 25 26  '$ ?GLUCOSE 165* 133* 141* 89 151*  ?BUN '17 19 18 10 '$ 6*  ?CREATININE 1.93* 2.00* 1.59* 1.39* 1.49*  ?CALCIUM 9.7  9.2 9.0 9.0 9.0  ?MG  --  1.7  --   --   --   ?PHOS  --  3.4  --   --   --   ? ?GFR: ?Estimated Creatinine Clearance: 30.3 mL/min (A) (by C-G formula based on SCr of 1.49 mg/dL (H)). ?Liver Function Tests: ?Recent Labs  ?Lab 02/18/22 ?1450 02/19/22 ?0402  ?AST 21 17  ?ALT 12 13  ?ALKPHOS 58 50  ?BILITOT 0.7 0.7  ?PROT 6.7 6.1*  ?ALBUMIN 3.3* 2.9*  ? ?No results for input(s): LIPASE, AMYLASE in the last 168 hours. ?No results for input(s): AMMONIA in the last 168 hours. ?Coagulation Profile: ?No results for input(s): INR, PROTIME in the last 168 hours. ?Cardiac Enzymes: ?No results for input(s): CKTOTAL, CKMB, CKMBINDEX, TROPONINI in the last 168 hours. ?BNP (last 3 results) ?No results for input(s): PROBNP in the last 8760 hours. ?HbA1C: ?No results for input(s): HGBA1C in the last 72 hours. ?CBG: ?Recent Labs  ?Lab 02/18/22 ?1920 02/20/22 ?1333  ?GLUCAP 132* 140*  ? ?Lipid Profile: ?No results for input(s): CHOL, HDL, LDLCALC, TRIG, CHOLHDL, LDLDIRECT in the last 72 hours. ?Thyroid Function Tests: ?No results for input(s): TSH, T4TOTAL, FREET4, T3FREE, THYROIDAB in the last 72 hours. ?Anemia Panel: ?Recent Labs  ?  02/19/22 ?1400 02/22/22 ?0418  ?[id number]  --  1,448*  ?FOLATE  --  >40.0  ?FERRITIN  --  1,022*  ?TIBC  --  134*  ?IRON  --  35  ?RETICCTPCT 1.1 1.3  ? ?Sepsis Labs: ?No results for input(s): PROCALCITON, LATICACIDVEN in the last 168 hours. ? ?Recent Results (from the past 240 hour(s))  ?MRSA Next Gen by PCR, Nasal     Status: None  ? Collection Time: 02/19/22  9:04 PM  ? Specimen: Nasal Mucosa; Nasal Swab  ?Result Value Ref Range Status  ? MRSA by PCR Next Gen NOT DETECTED NOT DETECTED Final  ?  Comment: (NOTE) ?The GeneXpert MRSA Assay (FDA approved for NASAL specimens only), ?is one component of a comprehensive MRSA colonization surveillance ?program. It is not intended to diagnose MRSA infection nor to guide ?or monitor treatment for MRSA infections. ?Test performance is not FDA approved in  patients less than 2 years ?old. ?Performed at [facility], [place] [address] [place], [place] ?[address] ?  ?  ? ? ? ? ? ?Radiology Studies: ?No results found. ? ? ? ? ? ?Scheduled Meds: ? apixaban  5 mg O

## 2022-02-22 NOTE — [place] FL2 (Signed)
?[place] MEDICAID FL2 LEVEL OF CARE SCREENING TOOL  ?  ? ?IDENTIFICATION  ?Patient Name: ?[name] Birthdate: February 17, 1951 Sex: female Admission Date (Current Location): ?02/18/2022  ?[place] and [place] Number: ? [place] ?  Facility and Address:  ?The . [facility], [place] [address], [place], [place] [address] ?     Provider Number: ?[id number]  ?Attending Physician Name and Address:  ?[place] Eric J, DO ? Relative Name and Phone Number:  ?  ?   ?Current Level of Care: ?Hospital Recommended Level of Care: ?[place] Prior Approval Number: ?  ? ?Date Approved/Denied: ?  PASRR Number: ?[id number] H ? ?Discharge Plan: ?SNF ?  ? ?Current Diagnoses: ?Patient Active Problem List  ? Diagnosis Date Noted  ? GI bleed 02/18/2022  ? Bacteremia due to Enterococcus 02/15/2022  ? Medication monitoring encounter 02/15/2022  ? Pain due to onychomycosis of toenails of both feet 02/06/2022  ? Adult failure to thrive syndrome 01/22/2022  ? History of anemia due to chronic kidney disease 01/17/2022  ? Neurocognitive deficits 01/17/2022  ? Pressure injury of skin 01/11/2022  ? History of pulmonary embolism 01/10/2022  ? Hypoglycemia 01/09/2022  ? CKD (chronic kidney disease) stage 4, GFR 15-29 ml/min (HCC) 01/09/2022  ? Syncope 01/09/2022  ? Essential hypertension   ? Debility 12/04/2021  ? Anoxia of brain ([place]) 12/04/2021  ? Acute on chronic renal failure ([place]) 11/12/2021  ? Leukocytosis   ? Thrombocytopenia ([place])   ? Aspiration pneumonia of both lower lobes ([place])   ? Hemodialysis-associated hypotension   ? Acute respiratory failure (HCC) due to recurrent aspiration Pneumonia and mucous plugging/atelectasis    ? Acute saddle pulmonary embolism with acute cor pulmonale (HCC)   ? Protein-calorie malnutrition, severe 11/08/2021  ? Cardiac arrest ([place]) 10/28/2021  ? Adjustment disorder with depressed mood 04/10/2021  ? History of infectious disease 04/10/2021  ? Affective psychosis ([place]) 11/21/2020  ?  Allergic rhinitis due to pollen 11/21/2020  ? Anxiety 11/21/2020  ? Chronic kidney disease, stage 2 (mild) 11/21/2020  ? Diabetic renal disease ([place]) 11/21/2020  ? Diverticular disease of colon 11/21/2020  ? Eczema 11/21/2020  ? Gastroesophageal reflux disease 11/21/2020  ? Gout 11/21/2020  ? Joint pain 11/21/2020  ? Lymphedema 11/21/2020  ? Mild intermittent asthma 11/21/2020  ? Moderate major depression, single episode ([place]) 11/21/2020  ? Neuropathy 11/21/2020  ? Type 2 diabetes mellitus with other specified complication ([place]) 72/06/4708  ? Pure hypercholesterolemia 11/21/2020  ? Vitamin D deficiency 11/21/2020  ? Fibromyalgia 09/04/2017  ? Insomnia 09/04/2017  ? DDD (degenerative disc disease), cervical 09/04/2017  ? DDD (degenerative disc disease), lumbar 09/04/2017  ? Primary osteoarthritis of both hands 09/04/2017  ? Primary osteoarthritis of both knees 09/04/2017  ? History of rotator cuff tear 09/04/2017  ? Osteopenia of multiple sites 09/04/2017  ? ? ?Orientation RESPIRATION BLADDER Height & Weight   ?  ?Self, Time, Situation, Place ? Normal Continent Weight:   ?Height:     ?BEHAVIORAL SYMPTOMS/MOOD NEUROLOGICAL BOWEL NUTRITION STATUS  ?    Incontinent Diet (please see discharge summary)  ?AMBULATORY STATUS COMMUNICATION OF NEEDS Skin   ?Limited Assist   Normal ?  ?  ?  ?    ?     ?     ? ? ?Personal Care Assistance Level of Assistance  ?Bathing, Feeding, Dressing Bathing Assistance: Limited assistance ?  ?Dressing Assistance: Limited assistance ?   ? ?Functional Limitations Info  ?Sight, Hearing, Speech Sight Info: Adequate ?Hearing  Info: Adequate ?Speech Info: Adequate  ? ? ?SPECIAL CARE FACTORS FREQUENCY  ?PT (By licensed PT), OT (By licensed OT)   ?  ?PT Frequency: 5x per week ?OT Frequency: 5x per week ?  ?  ?  ?   ? ? ?Contractures Contractures Info: Not present  ? ? ?Additional Factors Info  ?Code Status, Allergies Code Status Info: FULL ?Allergies Info: Allergies Info: Aspirin- Nausea And Vomiting;   Benadryl (diphenhydramine)-Can only take dye free. States the dye causes itching; Darvon (propoxyphene)- Itching Can only during the day. Night time makes her itch; Hydrocodone Bit-homatrop Mbr-Itching;   Metformin-GI; Other- States reaction was to nasal spray that caused pupils to shrink;  Pentazocine- nervous/jitters;  Percocet (oxycodone-acetaminophen)- itching;  Sulfa Antibiotics-  Hives;   Tetracaine Hcl-Thinks caused itching;  Tetracyclines & Related-  Hives;   Tramadol- itch ?  ?  ?  ?   ? ?Current Medications (02/22/2022):  This is the current hospital active medication list ?Current Facility-Administered Medications  ?Medication Dose Route Frequency Provider Last Rate Last Admin  ? acetaminophen (TYLENOL) tablet 650 mg  650 mg Oral Q6H PRN Irene Pap N, DO      ? albuterol (PROVENTIL) (2.5 MG/3ML) 0.083% nebulizer solution 2.5 mg  2.5 mg Inhalation Q4H PRN Kayleen Memos, DO      ? apixaban (ELIQUIS) tablet 5 mg  5 mg Oral BID [place] Eric J, DO   5 mg at 02/22/22 0830  ? atorvastatin (LIPITOR) tablet 20 mg  20 mg Oral Daily Irene Pap N, DO   20 mg at 02/22/22 0830  ? dicyclomine (BENTYL) capsule 10 mg  10 mg Oral TID AC Hall, Carole N, DO   10 mg at 02/22/22 0830  ? DULoxetine (CYMBALTA) DR capsule 20 mg  20 mg Oral Daily Irene Pap N, DO   20 mg at 02/22/22 0831  ? folic acid (FOLVITE) tablet 2 mg  2 mg Oral Daily Platter, Carole N, DO   2 mg at 02/22/22 0830  ? hydrocortisone (ANUSOL-HC) suppository 25 mg  25 mg Rectal BID Brahmbhatt, Parag, MD   25 mg at 02/22/22 0831  ? lidocaine (LIDODERM) 5 % 2 patch  2 patch Transdermal Daily Irene Pap N, DO   2 patch at 02/22/22 8841  ? melatonin tablet 3 mg  3 mg Oral QHS PRN Irene Pap N, DO   3 mg at 02/20/22 2112  ? ondansetron (ZOFRAN) injection 4 mg  4 mg Intravenous Q6H PRN Irene Pap N, DO      ? pantoprazole (PROTONIX) EC tablet 40 mg  40 mg Oral BID Irene Pap N, DO   40 mg at 02/22/22 0830  ? PARoxetine (PAXIL) tablet 40 mg  40 mg Oral QHS  Irene Pap N, DO   40 mg at 02/21/22 2107  ? polyethylene glycol (MIRALAX / GLYCOLAX) packet 17 g  17 g Oral Daily PRN Irene Pap N, DO      ? polyethylene glycol-electrolytes (NuLYTELY) solution 4,000 mL  4,000 mL Oral Once Brahmbhatt, Parag, MD      ? potassium chloride (KLOR-CON M) CR tablet 30 mEq  30 mEq Oral Q3H [place] Eric J, DO   30 mEq at 02/22/22 0830  ? ? ? ?Discharge Medications: ?Please see discharge summary for a list of discharge medications. ? ?Relevant Imaging Results: ? ?Relevant Lab Results: ? ? ?Additional Information ?SSN [id number] ? ?Vinie Sill, LCSW ? ? ? ? ?

## 2022-02-22 NOTE — Progress Notes (Signed)
Physical Therapy Treatment ?Patient Details ?Name: [name] ?MRN: [id number] ?DOB: 12/04/1950 ?Today's Date: 02/22/2022 ? ? ?History of Present Illness Pt is a 71 y/o female presenting on 4/24 with hematochezia. Found with lower GI bleed. PMH includes: chronic anxiety/depression, arthrtis, fibromyalgia, type 2 DM, HTN, recent admissions for v-fib arrest due to saddle pulmonary embolism and hypoglycemia. ? ?  ?PT Comments  ? ? Pt remains limited by impaired cognition, with poor retention of PT cues and impaired awareness of situation and deficits. Pt continues to demonstrate a posterior lean with attempts at standing and loses her balance backward multiple times during a brief period of ambulation. Pt does not seem to tolerate mobility well with tachycardia up to 150 over a brief period of ambulation. Pt will benefit from continued aggressive mobilization in an effort to reduce falls risk. PT now recommends HHPT as the pt's family has indicated they cannot afford SNF copay. Pt will benefit from having a wheelchair at the time of discharge to reduce falls risk with household mobility.   ?Recommendations for follow up therapy are one component of a multi-disciplinary discharge planning process, led by the attending physician.  Recommendations may be updated based on patient status, additional functional criteria and insurance authorization. ? ?Follow Up Recommendations ? Home health PT (unable to afford SNF co-pay per notes) ?  ?  ?Assistance Recommended at Discharge Frequent or constant Supervision/Assistance  ?Patient can return home with the following A lot of help with walking and/or transfers;A lot of help with bathing/dressing/bathroom;Assistance with cooking/housework;Direct supervision/assist for medications management;Direct supervision/assist for financial management;Help with stairs or ramp for entrance;Assist for transportation ?  ?Equipment Recommendations ? Wheelchair (measurements PT)  ?   ?Recommendations for Other Services   ? ? ?  ?Precautions / Restrictions Precautions ?Precautions: Fall ?Precaution Comments: watch HR (tachy to 150 during mobility) ?Restrictions ?Weight Bearing Restrictions: No  ?  ? ?Mobility ? Bed Mobility ?Overal bed mobility: Needs Assistance ?Bed Mobility: Supine to Sit, Sit to Supine ?  ?  ?Supine to sit: Min assist ?Sit to supine: Min guard ?  ?General bed mobility comments: assistance to scoot hips to edge of bed once in sitting ?  ? ?Transfers ?Overall transfer level: Needs assistance ?Equipment used: Rolling walker (2 wheels) ?Transfers: Sit to/from Stand ?Sit to Stand: Min assist, Mod assist ?  ?  ?  ?  ?  ?General transfer comment: posterior lean. PT provides cues for foot placement as well as increased knee and trunk flexion in an effort to reduce posterior lean. Pt with poor recall of PT cues between transfer attempts ?  ? ?Ambulation/Gait ?Ambulation/Gait assistance: Mod assist ?Gait Distance (Feet): 7 Feet ?Assistive device: Rolling walker (2 wheels) ?Gait Pattern/deviations: Step-to pattern ?Gait velocity: reduced ?Gait velocity interpretation: <1.31 ft/sec, indicative of household ambulator ?  ?General Gait Details: pt with slowed step-to gait, 2 posterior losses of balance requiring PT assist to prevent falls ? ? ?Stairs ?  ?  ?  ?  ?  ? ? ?Wheelchair Mobility ?  ? ?Modified Rankin (Stroke Patients Only) ?  ? ? ?  ?Balance Overall balance assessment: Needs assistance ?Sitting-balance support: No upper extremity supported, Feet supported ?Sitting balance-Leahy Scale: Fair ?  ?  ?Standing balance support: Bilateral upper extremity supported, Reliant on assistive device for balance ?Standing balance-Leahy Scale: Poor ?Standing balance comment: posterior losses of balance ?  ?  ?  ?  ?  ?  ?  ?  ?  ?  ?  ?  ? ?  ?  Cognition Arousal/Alertness: Awake/alert ?Behavior During Therapy: Impulsive ?Overall Cognitive Status: Impaired/Different from baseline ?Area of  Impairment: Orientation, Attention, Memory, Following commands, Safety/judgement, Awareness, Problem solving ?  ?  ?  ?  ?  ?  ?  ?  ?Orientation Level: Disoriented to, Time, Situation ?Current Attention Level: Sustained ?Memory: Decreased recall of precautions, Decreased short-term memory ?Following Commands: Follows one step commands with increased time ?Safety/Judgement: Decreased awareness of safety, Decreased awareness of deficits ?Awareness: Intellectual ?Problem Solving: Difficulty sequencing ?General Comments: poor awareness and recall of deficits ?  ?  ? ?  ?Exercises   ? ?  ?General Comments General comments (skin integrity, edema, etc.): HR up to 150 with limited ambulation. Pt reports she feels her heart is working, requests to lay down in order to calm herself. HR recovers to low 100s. BP and SpO2 stable ?  ?  ? ?Pertinent Vitals/Pain Pain Assessment ?Pain Assessment: Faces ?Faces Pain Scale: Hurts little more ?Pain Location: R calf ?Pain Descriptors / Indicators: Sore ?Pain Intervention(s): Monitored during session  ? ? ?Home Living   ?  ?  ?  ?  ?  ?  ?  ?  ?  ?   ?  ?Prior Function    ?  ?  ?   ? ?PT Goals (current goals can now be found in the care plan section) Acute Rehab PT Goals ?Patient Stated Goal: to go home ?Progress towards PT goals: Not progressing toward goals - comment (poor memory and retention of cues) ? ?  ?Frequency ? ? ? Min 3X/week ? ? ? ?  ?PT Plan Current plan remains appropriate  ? ? ?Co-evaluation   ?  ?  ?  ?  ? ?  ?AM-PAC PT "6 Clicks" Mobility   ?Outcome Measure ? Help needed turning from your back to your side while in a flat bed without using bedrails?: A Little ?Help needed moving from lying on your back to sitting on the side of a flat bed without using bedrails?: A Little ?Help needed moving to and from a bed to a chair (including a wheelchair)?: A Lot ?Help needed standing up from a chair using your arms (e.g., wheelchair or bedside chair)?: A Little ?Help needed to  walk in hospital room?: Total ?Help needed climbing 3-5 steps with a railing? : Total ?6 Click Score: 13 ? ?  ?End of Session   ?Activity Tolerance: Patient tolerated treatment well ?Patient left: in bed;with call bell/phone within reach;with bed alarm set ?Nurse Communication: Mobility status ?PT Visit Diagnosis: Unsteadiness on feet (R26.81);Muscle weakness (generalized) (M62.81);Difficulty in walking, not elsewhere classified (R26.2) ?  ? ? ?Time: 5631-4970 ?PT Time Calculation (min) (ACUTE ONLY): 25 min ? ?Charges:  $Gait Training: 8-22 mins ?$Therapeutic Activity: 8-22 mins          ?          ? ?Zenaida Niece, PT, DPT ?Acute Rehabilitation ?Pager: [phone] ?Office [phone] ? ? ? ?Zenaida Niece ?02/22/2022, 2:35 PM ? ?

## 2022-02-22 NOTE — TOC Progression Note (Signed)
Transition of Care (TOC) - Progression Note  ? ? ?Patient Details  ?Name: [name] ?MRN: [id number] ?Date of Birth: [birth year] ? ?Transition of Care (TOC) CM/SW Contact  ?Vinie Sill, LCSW ?Phone Number: ?02/22/2022, 10:23 AM ? ?Clinical Narrative:    ? ?Awaiting bed offers- informed family of possible co-pay - will will confirm with SNF - family has expressed unable to afford if in co-pay status.  ? ?TOC will continue to follow and assist with discharge planning. ? ?Expected Discharge Plan: [place] ?Barriers to Discharge: Ship broker, SNF Pending bed offer (maybe in co-pay staus for SNF) ? ?Expected Discharge Plan and Services ?Expected Discharge Plan: [place] ?In-house Referral: Clinical Social Work ?  ?  ?Living arrangements for the past 2 months: [place] ?                ?  ?  ?  ?  ?  ?  ?  ?  ?  ?  ? ? ?Social Determinants of Health (SDOH) Interventions ?  ? ?Readmission Risk Interventions ? ?  01/10/2022  ?  9:45 AM 12/04/2021  ?  4:26 PM 11/02/2021  ?  2:45 PM  ?Readmission Risk Prevention Plan  ?Transportation Screening Complete Complete Complete  ?PCP or Specialist Appt within 3-5 Days Complete Not Complete   ?Not Complete comments  INPT rehab   ?[place] or Home Care Consult Complete Complete   ?Social Work Consult for [place] Planning/Counseling Complete Complete   ?Palliative Care Screening Complete Complete   ?Medication Review Press photographer) Complete Complete   ? ? ?

## 2022-02-22 NOTE — Progress Notes (Signed)
Patient suffers from impaired balance and strength which impairs their ability to perform daily activities like standing and ambulating in the home.  A walker alone will not resolve the issues with performing activities of daily living. A wheelchair will allow patient to safely perform daily activities.  The patient can self propel in the home or has a caregiver who can provide assistance.    ? ? ?Zenaida Niece, PT, DPT ?Acute Rehabilitation ?Pager: [phone] ?Office [phone] ? ? ?

## 2022-02-22 NOTE — TOC Progression Note (Signed)
Transition of Care (TOC) - Progression Note  ? ? ?Patient Details  ?Name: [name] ?MRN: [id number] ?Date of Birth: 07/04/1951 ? ?Transition of Care (TOC) CM/SW Contact  ?[place], LCSW ?Phone Number: ?02/22/2022, 2:00 PM ? ?Clinical Narrative:    ? ?CSW called pt daughter and provided SNF bed offers. Daughter inquired about SNF copay's. Per chart review pt was admitted to [facility] on 01/16/22. Daughter states pt was Dc'd from [facility] on 02/14/22. Pt would have used initial 20 SNF days and therefore would have a copay. CSW informed daughter of this. Daughter states they would have to take pt home since they cannot afford copays. Daughter states that pt recently moved in with her at address listed for pt in chart. Daughter reports pt is active with [facility] for [facility] and has a walker and a 3-in-1. No additional DME needs noted by PT/OT. Daughter does request non emergency ambulance transport for pt to get home at DC. CSW discussed with attending who anticipates DC tomorrow and notified pt daughter of this. Alvis Lemmings confirmed they are active for [facility] PT/OT with pt.  ? ? ?Expected Discharge Plan: [place] ?Barriers to Discharge: Continued Medical Work up ? ?Expected Discharge Plan and Services ?Expected Discharge Plan: [place] ?In-house Referral: Clinical Social Work ?  ?  ?Living arrangements for the past 2 months: [place] ?                ?  ?  ?  ?  ?  ?  ?  ?  ?  ?  ? ? ?Social Determinants of Health (SDOH) Interventions ?  ? ?Readmission Risk Interventions ? ?  01/10/2022  ?  9:45 AM 12/04/2021  ?  4:26 PM 11/02/2021  ?  2:45 PM  ?Readmission Risk Prevention Plan  ?Transportation Screening Complete Complete Complete  ?PCP or Specialist Appt within 3-5 Days Complete Not Complete   ?Not Complete comments  INPT rehab   ?[place] or Home Care Consult Complete Complete   ?Social Work Consult for [place] Planning/Counseling Complete Complete   ?Palliative Care Screening  Complete Complete   ?Medication Review Press photographer) Complete Complete   ? ? ?

## 2022-02-22 NOTE — Progress Notes (Signed)
Patient suffers from weakness which impairs their ability to perform daily activities like bathing, dressing, and grooming in the home.  A walker will not resolve  ?issue with performing activities of daily living. A wheelchair will allow patient to safely perform daily activities. Patient is not able to propel themselves in the home using a standard weight wheelchair due to general weakness. Patient can self propel in the lightweight wheelchair. Length of need Lifetime.  ?Accessories: elevating leg rests (ELRs), wheel locks, extensions and anti-tippers. ?

## 2022-02-23 DIAGNOSIS — K922 Gastrointestinal hemorrhage, unspecified: Secondary | ICD-10-CM | POA: Diagnosis not present

## 2022-02-23 DIAGNOSIS — I2602 Saddle embolus of pulmonary artery with acute cor pulmonale: Secondary | ICD-10-CM | POA: Diagnosis not present

## 2022-02-23 DIAGNOSIS — I10 Essential (primary) hypertension: Secondary | ICD-10-CM | POA: Diagnosis not present

## 2022-02-23 DIAGNOSIS — N184 Chronic kidney disease, stage 4 (severe): Secondary | ICD-10-CM | POA: Diagnosis not present

## 2022-02-23 MED ORDER — HYDROCORTISONE ACETATE 25 MG RE SUPP: 25.0000 mg | Freq: Two times a day (BID) | RECTAL | 0 refills | Status: AC

## 2022-02-23 NOTE — Progress Notes (Signed)
Mobility Specialist Progress Note  ? ? 02/23/22 1205  ?Mobility  ?Activity Ambulated with assistance in room;Dangled on edge of bed  ?Level of Assistance Minimal assist, patient does 75% or more  ?Assistive Device Front wheel walker  ?Distance Ambulated (ft) 5 ft  ?Activity Response Tolerated fair  ?$Mobility charge 1 Mobility  ? ?Pt received in bed and agreeable. Pt had lateral lean and x1 LOB with minA to recover. Willing to complete x3 leg extensions/leg. Left laying in bed with daughter present.  ? ? ?Hildred Alamin ?Mobility Specialist  ?Primary: 5N M.S. Phone: [phone] ?Secondary: 6N M.S. Phone: [phone] ?  ?

## 2022-02-23 NOTE — Progress Notes (Signed)
Pt with orders to d/c home via PTAR. Assessment and VS documented. PIV removed. Discharge packet and education provided to pt and daughter all questions answered. Prescriptions sent to pharmacy pt aware. Pt and all belongings awaiting PTAR for transport.  ?

## 2022-02-23 NOTE — Progress Notes (Signed)
PTAR to transport pt and all belongings home. Discharge packet provided.  ?

## 2022-02-23 NOTE — Discharge Summary (Signed)
?Physician Discharge Summary  ?SEARA Hahn CBJ:[id number] DOB: 10-18-1951 DOA: 02/18/2022 ? ?PCP: Maria Pillar, MD ? ?Admit date: 02/18/2022 ?Discharge date: 02/23/2022 ? ?Admitted From:  ?Disposition:   ? ?Recommendations for Outpatient Follow-up:  ?Follow up with PCP in 1-2 weeks ?Follow-up with [facility] gastroenterology 3 weeks ?Continue Anusol hydrocortisone suppositories twice daily for total 14 days ?Please obtain CBC in one week to reassess hemoglobin level ?Please follow up on the following pending results: Pathology from polyp removal during colonoscopy pending at time of discharge ? ?Home Health: PT/OT ?Equipment/Devices: Wheelchair ? ?Discharge Condition: Stable ?CODE STATUS: Full code ?Diet recommendation: Heart healthy/consistent carb regular diet ? ?History of present illness: ? ?[name] is a 71 year old female with past medical history significant for chronic anxiety/depression, osteoarthritis, fibromyalgia, asthma, type 2 diabetes mellitus, GERD, hyperlipidemia, essential hypertension, history of V-fib arrest secondary to saddle pulmonary embolism on Eliquis who presented to [facility] ED on 4/24 from home via EMS for blood in her stool.  Patient reports 2 large-volume bloody stools on day of admission.  Not associate with abdominal pain or tenderness.  Also denies fever/chills.  Complicated by need of anticoagulation with Eliquis for recent saddle PE February 2023. ?  ?In the ED, temperature 98.1 ?F, HR 92, RR 16, BP 104 56, SPO2 99% on room air. WBC 6.6, hemoglobin 10.8, platelets 364.  Sodium 141, potassium 5.1, chloride 106, CO2 23, glucose 165, BUN 17, creatinine 1.93.  AST 21, ALT 12, total bilirubin 0.7.  Due to concern for lower GI bleed, likely diverticular, EDP requested admission.  Hospitalist service consulted for further evaluation management. ? ?Hospital course: ? ?Lower GI bleed 2/2 internal hemorrhoids ?Patient presenting to ED with 2 large-volume bright red bloody stools.   Complicated by use of anticoagulation with Eliquis.  [place] gastroenterology was consulted and followed during the hospital course.  Underwent EGD and colonoscopy on 02/20/2022 with findings of mild edema in the stomach, pandiverticulosis, small polyp descending colon removed with cold snare and medium size internal hemorrhoids with streaks of blood on the hemorrhoidal tissue.  Anemia panel with iron 35, TIBC 134, ferritin 1022 and folate greater than 40.  Surgical pathology from polyp removal pending at time of discharge.  Continue Anusol suppositories twice daily to complete 14 days per GI recommendations.  Hemoglobin stable, 8.7 at time of discharge.  Recommend repeat CBC 1 week. ?  ?History of V-fib arrest secondary to saddle pulmonary embolism on anticoagulation ?Continue Eliquis ?  ?Mild hyperkalemia: Resolved ?Hypokalemia ?Slightly hyperkalemic with a potassium 5.1 on admission, treated with IV fluid hydration with resolution. ?  ?Essential hypertension ?Home regimen includes Toprol tartrate 12.5 mg p.o. twice daily. ?  ?Anxiety/depression ?Paxil 40 mg p.o. daily ?  ?CKD stage IV ?Creatinine 1.93 on admission.  No other results in EMR/care everywhere for comparison.  Supported with IV fluid hydration.  Creatinine 1.49 at time of discharge.  Recommend repeat BMP 1 week. ?  ?Type 2 diabetes mellitus ?Hemoglobin A1c 6.3 on 01/09/2022, well controlled.  Diet controlled at home. ?  ?Chronic pain/fibromyalgia: ?Duloxetine 20 mg p.o. daily, lidocaine patch ?  ?GERD: ?Protonix 40 mg p.o. BID ?  ?Physical deconditioning: ?PT/OT recommend SNF, but patient would have significant co-pays and have decided to return home with home health. ? ? ? ? ? ?Discharge Diagnoses:  ?Principal Problem: ?  GI bleed ?Active Problems: ?  Type 2 diabetes mellitus with other specified complication ([place]) ?  CKD (chronic kidney disease) stage 4, GFR 15-29 ml/min (HCC) ?  Essential hypertension ?  Fibromyalgia ?  Acute saddle pulmonary embolism  with acute cor pulmonale (HCC) ? ? ? ?Discharge Instructions ? ?Discharge Instructions   ? ? Call MD for:  difficulty breathing, headache or visual disturbances   Complete by: As directed ?  ? Call MD for:  extreme fatigue   Complete by: As directed ?  ? Call MD for:  persistant dizziness or light-headedness   Complete by: As directed ?  ? Call MD for:  persistant nausea and vomiting   Complete by: As directed ?  ? Call MD for:  severe uncontrolled pain   Complete by: As directed ?  ? Call MD for:  temperature >100.4   Complete by: As directed ?  ? Diet - low sodium heart healthy   Complete by: As directed ?  ? Increase activity slowly   Complete by: As directed ?  ? ?  ? ?Allergies as of 02/23/2022   ? ?   Reactions  ? Aspirin Nausea And Vomiting  ? Other reaction(s): Unknown  ? Benadryl [diphenhydramine]   ? Can only take dye free. States the dye causes itching.  ? Darvon [propoxyphene] Itching  ? Can only during the day. Night time makes her itch  ? Hydrocodone Bit-homatrop Mbr Itching  ? Pre medicate  ? Metformin   ? Other reaction(s): GI  ? Other   ? Other reaction(s): Unknown. States reaction was to nasal spray that caused pupils to shrink  ? Pentazocine   ? nervous ?Other reaction(s): jitters  ? Percocet [oxycodone-acetaminophen]   ? Itching; pre medicate  ? Sulfa Antibiotics Hives  ? Tetracaine Hcl   ? Other reaction(s): Unknown. Thinks caused itching.  ? Tetracyclines & Related Hives  ? Tramadol   ? Other reaction(s): itch  ? ?  ? ?  ?Medication List  ?  ? ?STOP taking these medications   ? ?PARoxetine 40 MG tablet ?Commonly known as: PAXIL ?  ? ?  ? ?TAKE these medications   ? ?acetaminophen 325 MG tablet ?Commonly known as: TYLENOL ?Take 2 tablets (650 mg total) by mouth every 4 (four) hours as needed for fever (greater than 37.6 C). ?  ?albuterol 108 (90 Base) MCG/ACT inhaler ?Commonly known as: VENTOLIN HFA ?Inhale 1-2 puffs into the lungs every 4 (four) hours as needed for wheezing or shortness of  breath. ?  ?apixaban 5 MG Tabs tablet ?Commonly known as: ELIQUIS ?Take 1 tablet (5 mg total) by mouth 2 (two) times daily. ?  ?atorvastatin 20 MG tablet ?Commonly known as: LIPITOR ?Take 1 tablet (20 mg total) by mouth daily. ?  ?B-complex with vitamin C tablet ?Take 1 tablet by mouth daily. ?  ?dicyclomine 10 MG capsule ?Commonly known as: BENTYL ?Take 1 capsule (10 mg total) by mouth 3 (three) times daily before meals. ?  ?DULoxetine 20 MG capsule ?Commonly known as: CYMBALTA ?Take 1 capsule (20 mg total) by mouth daily. ?  ?folic acid 1 MG tablet ?Commonly known as: FOLVITE ?Take 2 tablets (2 mg total) by mouth daily. ?  ?Glucerna Liqd ?Take 237 mLs by mouth daily. ?  ?hydrocortisone 25 MG suppository ?Commonly known as: ANUSOL-HC ?Place 1 suppository (25 mg total) rectally 2 (two) times daily for 12 days. ?  ?lidocaine 5 % ?Commonly known as: LIDODERM ?Place 2 patches onto the skin daily. Remove & Discard patch within 12 hours or as directed by MD ?  ?melatonin 5 MG Tabs ?Take 1 tablet (5 mg total) by mouth  every evening. ?  ?metoprolol tartrate 25 MG tablet ?Commonly known as: LOPRESSOR ?Take 0.5 tablets (12.5 mg total) by mouth 2 (two) times daily. ?  ?pantoprazole 40 MG tablet ?Commonly known as: PROTONIX ?Take 1 tablet (40 mg total) by mouth 2 (two) times daily. ?  ?PRESCRIPTION MEDICATION ?See admin instructions. A cream that starts with an M for a sore on the back of her head ?  ?Vitamin D (Ergocalciferol) 1.25 MG (50000 UNIT) Caps capsule ?Commonly known as: DRISDOL ?Take 1 capsule (50,000 Units total) by mouth once a week. ?  ? ?  ? ?  ?  ? ? ?  ?Durable Medical Equipment  ?(From admission, onward)  ?  ? ? ?  ? ?  Start     Ordered  ? 02/22/22 1451  For home use only DME lightweight manual wheelchair with seat cushion  Once       ?Comments: Patient suffers from weakness which impairs their ability to perform daily activities like bathing, dressing, and grooming in the home.  A walker will not resolve   ?issue with performing activities of daily living. A wheelchair will allow patient to safely perform daily activities. Patient is not able to propel themselves in the home using a standard weight wheelchair due to g

## 2022-03-05 ENCOUNTER — Other Ambulatory Visit: Payer: Self-pay | Admitting: Physician Assistant

## 2022-03-05 DIAGNOSIS — I129 Hypertensive chronic kidney disease with stage 1 through stage 4 chronic kidney disease, or unspecified chronic kidney disease: Secondary | ICD-10-CM | POA: Diagnosis not present

## 2022-03-05 DIAGNOSIS — G629 Polyneuropathy, unspecified: Secondary | ICD-10-CM | POA: Diagnosis not present

## 2022-03-05 DIAGNOSIS — E1121 Type 2 diabetes mellitus with diabetic nephropathy: Secondary | ICD-10-CM | POA: Diagnosis not present

## 2022-03-05 DIAGNOSIS — N184 Chronic kidney disease, stage 4 (severe): Secondary | ICD-10-CM | POA: Diagnosis not present

## 2022-03-05 DIAGNOSIS — K922 Gastrointestinal hemorrhage, unspecified: Secondary | ICD-10-CM | POA: Diagnosis not present

## 2022-03-05 DIAGNOSIS — D469 Myelodysplastic syndrome, unspecified: Secondary | ICD-10-CM | POA: Diagnosis not present

## 2022-03-05 DIAGNOSIS — K5730 Diverticulosis of large intestine without perforation or abscess without bleeding: Secondary | ICD-10-CM | POA: Diagnosis not present

## 2022-03-05 DIAGNOSIS — E7800 Pure hypercholesterolemia, unspecified: Secondary | ICD-10-CM | POA: Diagnosis not present

## 2022-03-05 DIAGNOSIS — F39 Unspecified mood [affective] disorder: Secondary | ICD-10-CM | POA: Diagnosis not present

## 2022-03-07 ENCOUNTER — Telehealth: Payer: Self-pay | Admitting: Rheumatology

## 2022-03-07 NOTE — Telephone Encounter (Signed)
Please advise that we do not treat insomnia.  She will need refill from her PCP.

## 2022-03-07 NOTE — Telephone Encounter (Signed)
[name], patients daughter called the office requesting a refill of Temazepam '15mg'$ . Refill previously declined on 03/05/22. If any questions call [name] Stahnke @ [phone]  ?

## 2022-03-07 NOTE — Telephone Encounter (Signed)
Attempted to contact the patient and left message for patient to call the office.  

## 2022-03-08 NOTE — Telephone Encounter (Signed)
Spoke with [name] and advised per Dr. Estanislado Pandy, we do not treat insomnia.  She will need refill from her PCP. She expressed understanding. Advised [name] the last provider who prescribed the medication for Lelon Frohlich was Dr. Berle Mull.  ?

## 2022-03-12 DIAGNOSIS — I129 Hypertensive chronic kidney disease with stage 1 through stage 4 chronic kidney disease, or unspecified chronic kidney disease: Secondary | ICD-10-CM | POA: Diagnosis not present

## 2022-03-12 DIAGNOSIS — E1122 Type 2 diabetes mellitus with diabetic chronic kidney disease: Secondary | ICD-10-CM | POA: Diagnosis not present

## 2022-03-12 DIAGNOSIS — Z86711 Personal history of pulmonary embolism: Secondary | ICD-10-CM | POA: Diagnosis not present

## 2022-03-12 DIAGNOSIS — D631 Anemia in chronic kidney disease: Secondary | ICD-10-CM | POA: Diagnosis not present

## 2022-03-12 DIAGNOSIS — N184 Chronic kidney disease, stage 4 (severe): Secondary | ICD-10-CM | POA: Diagnosis not present

## 2022-03-14 DIAGNOSIS — N184 Chronic kidney disease, stage 4 (severe): Secondary | ICD-10-CM | POA: Diagnosis not present

## 2022-03-20 ENCOUNTER — Encounter: Payer: Self-pay | Admitting: Hematology & Oncology

## 2022-03-20 ENCOUNTER — Inpatient Hospital Stay: Payer: Medicare PPO | Attending: Hematology & Oncology

## 2022-03-20 ENCOUNTER — Inpatient Hospital Stay (HOSPITAL_BASED_OUTPATIENT_CLINIC_OR_DEPARTMENT_OTHER): Payer: Medicare PPO | Admitting: Hematology & Oncology

## 2022-03-20 VITALS — BP 125/82 | HR 71 | Temp 98.1°F | Resp 20 | Ht 65.0 in | Wt 119.0 lb

## 2022-03-20 DIAGNOSIS — R5383 Other fatigue: Secondary | ICD-10-CM | POA: Diagnosis not present

## 2022-03-20 DIAGNOSIS — G4700 Insomnia, unspecified: Secondary | ICD-10-CM | POA: Insufficient documentation

## 2022-03-20 DIAGNOSIS — N182 Chronic kidney disease, stage 2 (mild): Secondary | ICD-10-CM

## 2022-03-20 DIAGNOSIS — Z7901 Long term (current) use of anticoagulants: Secondary | ICD-10-CM | POA: Insufficient documentation

## 2022-03-20 DIAGNOSIS — I2602 Saddle embolus of pulmonary artery with acute cor pulmonale: Secondary | ICD-10-CM

## 2022-03-20 DIAGNOSIS — M7989 Other specified soft tissue disorders: Secondary | ICD-10-CM | POA: Diagnosis not present

## 2022-03-20 DIAGNOSIS — Z79899 Other long term (current) drug therapy: Secondary | ICD-10-CM | POA: Diagnosis not present

## 2022-03-20 DIAGNOSIS — Z8674 Personal history of sudden cardiac arrest: Secondary | ICD-10-CM

## 2022-03-20 DIAGNOSIS — R932 Abnormal findings on diagnostic imaging of liver and biliary tract: Secondary | ICD-10-CM | POA: Insufficient documentation

## 2022-03-20 DIAGNOSIS — I2692 Saddle embolus of pulmonary artery without acute cor pulmonale: Secondary | ICD-10-CM | POA: Diagnosis not present

## 2022-03-20 DIAGNOSIS — R160 Hepatomegaly, not elsewhere classified: Secondary | ICD-10-CM

## 2022-03-20 DIAGNOSIS — R0782 Intercostal pain: Secondary | ICD-10-CM

## 2022-03-20 LAB — CMP (CANCER CENTER ONLY)
ALT: 12 U/L (ref 0–44)
AST: 19 U/L (ref 15–41)
Albumin: 3.8 g/dL (ref 3.5–5.0)
Alkaline Phosphatase: 64 U/L (ref 38–126)
Anion gap: 9 (ref 5–15)
BUN: 13 mg/dL (ref 8–23)
CO2: 28 mmol/L (ref 22–32)
Calcium: 9.7 mg/dL (ref 8.9–10.3)
Chloride: 101 mmol/L (ref 98–111)
Creatinine: 1.58 mg/dL — ABNORMAL HIGH (ref 0.44–1.00)
GFR, Estimated: 34 mL/min — ABNORMAL LOW (ref >60)
Glucose, Bld: 197 mg/dL — ABNORMAL HIGH (ref 70–99)
Potassium: 3.6 mmol/L (ref 3.5–5.1)
Sodium: 138 mmol/L (ref 135–145)
Total Bilirubin: 0.4 mg/dL (ref 0.3–1.2)
Total Protein: 7.1 g/dL (ref 6.5–8.1)

## 2022-03-20 LAB — FERRITIN: Ferritin: 828 ng/mL — ABNORMAL HIGH (ref 11–307)

## 2022-03-20 LAB — CBC WITH DIFFERENTIAL (CANCER CENTER ONLY)
Abs Immature Granulocytes: 0.02 10*3/uL (ref 0.00–0.07)
Basophils Absolute: 0.1 10*3/uL (ref 0.0–0.1)
Basophils Relative: 1 %
Eosinophils Absolute: 0.2 10*3/uL (ref 0.0–0.5)
Eosinophils Relative: 3 %
HCT: 31.7 % — ABNORMAL LOW (ref 36.0–46.0)
Hemoglobin: 10.1 g/dL — ABNORMAL LOW (ref 12.0–15.0)
Immature Granulocytes: 0 %
Lymphocytes Relative: 27 %
Lymphs Abs: 1.8 10*3/uL (ref 0.7–4.0)
MCH: 27.2 pg (ref 26.0–34.0)
MCHC: 31.9 g/dL (ref 30.0–36.0)
MCV: 85.4 fL (ref 80.0–100.0)
Monocytes Absolute: 0.5 10*3/uL (ref 0.1–1.0)
Monocytes Relative: 7 %
Neutro Abs: 4 10*3/uL (ref 1.7–7.7)
Neutrophils Relative %: 62 %
Platelet Count: 407 10*3/uL — ABNORMAL HIGH (ref 150–400)
RBC: 3.71 MIL/uL — ABNORMAL LOW (ref 3.87–5.11)
RDW: 13.4 % (ref 11.5–15.5)
WBC Count: 6.5 10*3/uL (ref 4.0–10.5)
nRBC: 0 % (ref 0.0–0.2)

## 2022-03-20 LAB — SAMPLE TO BLOOD BANK

## 2022-03-20 LAB — RETICULOCYTES
Immature Retic Fract: 11.5 % (ref 2.3–15.9)
RBC.: 3.71 MIL/uL — ABNORMAL LOW (ref 3.87–5.11)
Retic Count, Absolute: 47.1 10*3/uL (ref 19.0–186.0)
Retic Ct Pct: 1.3 % (ref 0.4–3.1)

## 2022-03-20 LAB — SAVE SMEAR(SSMR), FOR PROVIDER SLIDE REVIEW

## 2022-03-20 LAB — LACTATE DEHYDROGENASE: LDH: 180 U/L (ref 98–192)

## 2022-03-20 MED ORDER — TEMAZEPAM 7.5 MG PO CAPS: 7.5000 mg | ORAL_CAPSULE | Freq: Every evening | ORAL | 0 refills | Status: AC | PRN

## 2022-03-20 NOTE — Progress Notes (Signed)
Hematology and Oncology Follow Up Visit  [name] [id number] 1951-08-25 71 y.o. 03/20/2022   Principle Diagnosis:  Saddle pulmonary embolism-possible lupus anticoagulant Anemia secondary to renal insufficiency Potential liver mass versus bleed  Current Therapy:   Eliquis 5 mg p.o. twice daily     Interim History:  [name] is back for first office visit.  I remember seeing her back in January.  She was in the ICU for weeks.  She had a cardiac arrest.  She was found to have a saddle embolus.  She was on heparin for quite a while.  She was then transitioned over to Lovenox and finally Eliquis.  She had renal failure.  She had a hepatic failure.  She was on the ventilator for quite a while.  She was on dialysis.  She subsequently improved.  However, she still had health issues.  She had a MRI of the liver thinking that there was a mass in the liver.  Again, the radiologist cannot prove this.  As such, we will going to have to follow-up with another MRI.  It is possible that she may have had a bleed in the liver.  When we saw her, the alpha-fetoprotein was normal.  She is living with one of her daughters right now.  Hopefully, she will be able to go home at some point.  She is trying to watch what she eats.  She is having a little bit of swelling.  She has had no nausea or vomiting.  She is urinating okay.  She is having no diarrhea.  There has been no cough.  She has had no shortness of breath.  She is not sleeping all that well.  I will refill her temazepam.  She remains incredibly faithful.  She has a strong faith.  We shared our faith in the hospital.  We shared a prayer in the hospital.  Currently, I would have to say that her performance status is probably ECOG 2.  Medications:  Current Outpatient Medications:    acetaminophen (TYLENOL) 325 MG tablet, Take 2 tablets (650 mg total) by mouth every 4 (four) hours as needed for fever (greater than 37.6 C)., Disp: , Rfl:     albuterol (VENTOLIN HFA) 108 (90 Base) MCG/ACT inhaler, Inhale 1-2 puffs into the lungs every 4 (four) hours as needed for wheezing or shortness of breath., Disp: 8.5 g, Rfl: 0   amLODipine (NORVASC) 10 MG tablet, Take by mouth daily., Disp: , Rfl:    apixaban (ELIQUIS) 5 MG TABS tablet, Take 1 tablet (5 mg total) by mouth 2 (two) times daily., Disp: 60 tablet, Rfl: 0   atorvastatin (LIPITOR) 20 MG tablet, Take 1 tablet (20 mg total) by mouth daily., Disp: 30 tablet, Rfl: 0   B Complex-C (B-COMPLEX WITH VITAMIN C) tablet, Take 1 tablet by mouth daily., Disp: 30 tablet, Rfl: 0   dicyclomine (BENTYL) 10 MG capsule, Take 1 capsule (10 mg total) by mouth 3 (three) times daily before meals., Disp: 90 capsule, Rfl: 0   DULoxetine (CYMBALTA) 20 MG capsule, Take 1 capsule (20 mg total) by mouth daily., Disp: 30 capsule, Rfl: 0   FLOVENT HFA 220 MCG/ACT inhaler, Inhale into the lungs., Disp: , Rfl:    folic acid (FOLVITE) 1 MG tablet, Take 2 tablets (2 mg total) by mouth daily., Disp: 60 tablet, Rfl: 0   Glucerna (GLUCERNA) LIQD, Take 237 mLs by mouth daily., Disp: , Rfl:    lidocaine (LIDODERM) 5 %, Place 2 patches  onto the skin daily. Remove & Discard patch within 12 hours or as directed by MD, Disp: 60 patch, Rfl: 0   melatonin 5 MG TABS, Take 1 tablet (5 mg total) by mouth every evening., Disp: 30 tablet, Rfl: 0   metoprolol tartrate (LOPRESSOR) 25 MG tablet, Take 0.5 tablets (12.5 mg total) by mouth 2 (two) times daily., Disp: 30 tablet, Rfl: 0   pantoprazole (PROTONIX) 40 MG tablet, Take 1 tablet (40 mg total) by mouth 2 (two) times daily., Disp: 60 tablet, Rfl: 0   PARoxetine (PAXIL) 40 MG tablet, Take by mouth daily., Disp: , Rfl:    temazepam (RESTORIL) 7.5 MG capsule, Take 1 capsule (7.5 mg total) by mouth at bedtime as needed for sleep., Disp: 30 capsule, Rfl: 0   Vitamin D, Ergocalciferol, (DRISDOL) 1.25 MG (50000 UNIT) CAPS capsule, Take 1 capsule (50,000 Units total) by mouth once a week.,  Disp: 5 capsule, Rfl: 0  Allergies:  Allergies  Allergen Reactions   Aspirin Nausea And Vomiting    Other reaction(s): Unknown   Benadryl [Diphenhydramine]     Can only take dye free. States the dye causes itching.   Darvon [Propoxyphene] Itching    Can only during the day. Night time makes her itch   Hydrocodone Bit-Homatrop Mbr Itching    Pre medicate   Metformin     Other reaction(s): GI   Other     Other reaction(s): Unknown. States reaction was to nasal spray that caused pupils to shrink   Pentazocine     nervous Other reaction(s): jitters   Percocet [Oxycodone-Acetaminophen]     Itching; pre medicate   Sulfa Antibiotics Hives   Tetracaine Hcl     Other reaction(s): Unknown. Thinks caused itching.   Tetracyclines & Related Hives   Tramadol     Other reaction(s): itch    Past Medical History, Surgical history, Social history, and Family History were reviewed and updated.  Review of Systems: Review of Systems  Constitutional:  Positive for fatigue.  HENT:  Negative.    Eyes: Negative.   Respiratory: Negative.    Cardiovascular:  Positive for leg swelling.  Gastrointestinal: Negative.   Endocrine: Negative.   Genitourinary: Negative.    Musculoskeletal: Negative.   Skin: Negative.   Neurological: Negative.   Hematological: Negative.   Psychiatric/Behavioral: Negative.     Physical Exam:  height is '5\' 5"'$  (1.651 m) and weight is 119 lb (54 kg). Her oral temperature is 98.1 F (36.7 C). Her blood pressure is 125/82 and her pulse is 71. Her respiration is 20 and oxygen saturation is 99%.   Wt Readings from Last 3 Encounters:  03/20/22 119 lb (54 kg)  02/14/22 126 lb 6.4 oz (57.3 kg)  02/07/22 127 lb (57.6 kg)    Physical Exam Vitals reviewed.  HENT:     Head: Normocephalic and atraumatic.  Eyes:     Pupils: Pupils are equal, round, and reactive to light.  Cardiovascular:     Rate and Rhythm: Normal rate and regular rhythm.     Heart sounds: Normal heart  sounds.  Pulmonary:     Effort: Pulmonary effort is normal.     Breath sounds: Normal breath sounds.  Abdominal:     General: Bowel sounds are normal.     Palpations: Abdomen is soft.  Musculoskeletal:        General: No tenderness or deformity. Normal range of motion.     Cervical back: Normal range of motion.  Lymphadenopathy:  Cervical: No cervical adenopathy.  Skin:    General: Skin is warm and dry.     Findings: No erythema or rash.  Neurological:     Mental Status: She is alert and oriented to person, place, and time.  Psychiatric:        Behavior: Behavior normal.        Thought Content: Thought content normal.        Judgment: Judgment normal.     Lab Results  Component Value Date   WBC 6.5 03/20/2022   HGB 10.1 (L) 03/20/2022   HCT 31.7 (L) 03/20/2022   MCV 85.4 03/20/2022   PLT 407 (H) 03/20/2022     Chemistry      Component Value Date/Time   NA 138 03/20/2022 1338   NA 139 01/23/2022 0000   K 3.6 03/20/2022 1338   CL 101 03/20/2022 1338   CO2 28 03/20/2022 1338   BUN 13 03/20/2022 1338   BUN 23 (A) 01/23/2022 0000   CREATININE 1.58 (H) 03/20/2022 1338   CREATININE 1.14 (H) 03/06/2017 1502   GLU 138 01/23/2022 0000      Component Value Date/Time   CALCIUM 9.7 03/20/2022 1338   ALKPHOS 64 03/20/2022 1338   AST 19 03/20/2022 1338   ALT 12 03/20/2022 1338   BILITOT 0.4 03/20/2022 1338      Impression and Plan: Ms. Keys is a very nice 71 year old Afro-American female.  She came in with a cardiac arrest.  She had a pulmonary embolism that was a saddle embolus.  She managed to get through this.  She had temporary renal failure.  She had temporary hepatic failure.  Thankfully, she was able to get off all of the supporting machines.  She is on Eliquis right now.  I would like to repeat a CT angiogram of her chest.  Hopefully we can do this with her kidneys the way they are.  We will see about Doppler of her legs.  I am not sure if there is any  malignancy in the liver.  I would not think so.  I would think that what was seen previously was bleeding.  Again, I suspect that she does have a pathologic lupus anticoagulant.  I would think that given the significant nature of her thromboembolic disease, that the lupus anticoagulant would also be significant.  I am so happy that she is able to make it through her crises.  Again she has been on the hospital for at least 2 1/2-3 months.  I would like to see her back in June.  Again, hopefully we can get our scans done given her renal function.   Volanda Napoleon, MD 5/24/20233:34 PM

## 2022-03-21 LAB — IRON AND IRON BINDING CAPACITY (CC-WL,HP ONLY)
Iron: 47 ug/dL (ref 28–170)
Saturation Ratios: 27 % (ref 10.4–31.8)
TIBC: 172 ug/dL — ABNORMAL LOW (ref 250–450)
UIBC: 125 ug/dL — ABNORMAL LOW (ref 148–442)

## 2022-03-21 LAB — AFP TUMOR MARKER: AFP, Serum, Tumor Marker: <1.8 ng/mL (ref 0.0–9.2)

## 2022-03-21 NOTE — Addendum Note (Signed)
Addended by: Burney Gauze R on: 03/21/2022 04:30 PM   Modules accepted: Orders

## 2022-03-28 ENCOUNTER — Ambulatory Visit (HOSPITAL_COMMUNITY): Admission: RE | Admit: 2022-03-28 | Payer: Medicare PPO | Source: Ambulatory Visit

## 2022-04-01 DIAGNOSIS — G931 Anoxic brain damage, not elsewhere classified: Secondary | ICD-10-CM | POA: Diagnosis not present

## 2022-04-01 DIAGNOSIS — I2699 Other pulmonary embolism without acute cor pulmonale: Secondary | ICD-10-CM | POA: Diagnosis not present

## 2022-04-01 DIAGNOSIS — D6869 Other thrombophilia: Secondary | ICD-10-CM | POA: Diagnosis not present

## 2022-04-05 ENCOUNTER — Ambulatory Visit (HOSPITAL_COMMUNITY): Payer: Medicare PPO

## 2022-04-18 ENCOUNTER — Other Ambulatory Visit: Payer: Self-pay | Admitting: Adult Health

## 2022-04-18 DIAGNOSIS — E7800 Pure hypercholesterolemia, unspecified: Secondary | ICD-10-CM

## 2022-04-24 ENCOUNTER — Ambulatory Visit (HOSPITAL_COMMUNITY): Payer: Medicare PPO

## 2022-04-26 ENCOUNTER — Inpatient Hospital Stay (HOSPITAL_BASED_OUTPATIENT_CLINIC_OR_DEPARTMENT_OTHER): Payer: Medicare PPO | Admitting: Hematology & Oncology

## 2022-04-26 ENCOUNTER — Other Ambulatory Visit: Payer: Self-pay

## 2022-04-26 ENCOUNTER — Encounter: Payer: Self-pay | Admitting: Hematology & Oncology

## 2022-04-26 ENCOUNTER — Inpatient Hospital Stay: Payer: Medicare PPO | Attending: Hematology & Oncology

## 2022-04-26 VITALS — BP 118/72 | HR 105 | Temp 98.6°F | Resp 18 | Ht 65.0 in | Wt 112.0 lb

## 2022-04-26 DIAGNOSIS — R5383 Other fatigue: Secondary | ICD-10-CM | POA: Diagnosis not present

## 2022-04-26 DIAGNOSIS — Z885 Allergy status to narcotic agent status: Secondary | ICD-10-CM | POA: Diagnosis not present

## 2022-04-26 DIAGNOSIS — Z882 Allergy status to sulfonamides status: Secondary | ICD-10-CM | POA: Insufficient documentation

## 2022-04-26 DIAGNOSIS — R160 Hepatomegaly, not elsewhere classified: Secondary | ICD-10-CM

## 2022-04-26 DIAGNOSIS — Z79899 Other long term (current) drug therapy: Secondary | ICD-10-CM | POA: Insufficient documentation

## 2022-04-26 DIAGNOSIS — Z7951 Long term (current) use of inhaled steroids: Secondary | ICD-10-CM | POA: Insufficient documentation

## 2022-04-26 DIAGNOSIS — I469 Cardiac arrest, cause unspecified: Secondary | ICD-10-CM | POA: Insufficient documentation

## 2022-04-26 DIAGNOSIS — K7290 Hepatic failure, unspecified without coma: Secondary | ICD-10-CM | POA: Insufficient documentation

## 2022-04-26 DIAGNOSIS — Z7901 Long term (current) use of anticoagulants: Secondary | ICD-10-CM | POA: Insufficient documentation

## 2022-04-26 DIAGNOSIS — D6862 Lupus anticoagulant syndrome: Secondary | ICD-10-CM | POA: Insufficient documentation

## 2022-04-26 DIAGNOSIS — M7989 Other specified soft tissue disorders: Secondary | ICD-10-CM | POA: Diagnosis not present

## 2022-04-26 DIAGNOSIS — Z888 Allergy status to other drugs, medicaments and biological substances status: Secondary | ICD-10-CM | POA: Insufficient documentation

## 2022-04-26 DIAGNOSIS — D638 Anemia in other chronic diseases classified elsewhere: Secondary | ICD-10-CM | POA: Diagnosis not present

## 2022-04-26 DIAGNOSIS — E1169 Type 2 diabetes mellitus with other specified complication: Secondary | ICD-10-CM | POA: Diagnosis not present

## 2022-04-26 DIAGNOSIS — Z886 Allergy status to analgesic agent status: Secondary | ICD-10-CM | POA: Diagnosis not present

## 2022-04-26 DIAGNOSIS — I2692 Saddle embolus of pulmonary artery without acute cor pulmonale: Secondary | ICD-10-CM | POA: Diagnosis not present

## 2022-04-26 DIAGNOSIS — N179 Acute kidney failure, unspecified: Secondary | ICD-10-CM | POA: Insufficient documentation

## 2022-04-26 DIAGNOSIS — K264 Chronic or unspecified duodenal ulcer with hemorrhage: Secondary | ICD-10-CM

## 2022-04-26 DIAGNOSIS — I2602 Saddle embolus of pulmonary artery with acute cor pulmonale: Secondary | ICD-10-CM

## 2022-04-26 DIAGNOSIS — N182 Chronic kidney disease, stage 2 (mild): Secondary | ICD-10-CM

## 2022-04-26 LAB — CBC WITH DIFFERENTIAL (CANCER CENTER ONLY)
Abs Immature Granulocytes: 0.02 10*3/uL (ref 0.00–0.07)
Basophils Absolute: 0.1 10*3/uL (ref 0.0–0.1)
Basophils Relative: 1 %
Eosinophils Absolute: 0.3 10*3/uL (ref 0.0–0.5)
Eosinophils Relative: 4 %
HCT: 34.1 % — ABNORMAL LOW (ref 36.0–46.0)
Hemoglobin: 10.9 g/dL — ABNORMAL LOW (ref 12.0–15.0)
Immature Granulocytes: 0 %
Lymphocytes Relative: 28 %
Lymphs Abs: 2.1 10*3/uL (ref 0.7–4.0)
MCH: 26.9 pg (ref 26.0–34.0)
MCHC: 32 g/dL (ref 30.0–36.0)
MCV: 84.2 fL (ref 80.0–100.0)
Monocytes Absolute: 0.8 10*3/uL (ref 0.1–1.0)
Monocytes Relative: 10 %
Neutro Abs: 4.4 10*3/uL (ref 1.7–7.7)
Neutrophils Relative %: 57 %
Platelet Count: 450 10*3/uL — ABNORMAL HIGH (ref 150–400)
RBC: 4.05 MIL/uL (ref 3.87–5.11)
RDW: 13 % (ref 11.5–15.5)
WBC Count: 7.7 10*3/uL (ref 4.0–10.5)
nRBC: 0 % (ref 0.0–0.2)

## 2022-04-26 LAB — IRON AND IRON BINDING CAPACITY (CC-WL,HP ONLY)
Iron: 51 ug/dL (ref 28–170)
Saturation Ratios: 30 % (ref 10.4–31.8)
TIBC: 172 ug/dL — ABNORMAL LOW (ref 250–450)
UIBC: 121 ug/dL — ABNORMAL LOW (ref 148–442)

## 2022-04-26 LAB — CMP (CANCER CENTER ONLY)
ALT: 11 U/L (ref 0–44)
AST: 16 U/L (ref 15–41)
Albumin: 3.6 g/dL (ref 3.5–5.0)
Alkaline Phosphatase: 62 U/L (ref 38–126)
Anion gap: 9 (ref 5–15)
BUN: 15 mg/dL (ref 8–23)
CO2: 30 mmol/L (ref 22–32)
Calcium: 9.9 mg/dL (ref 8.9–10.3)
Chloride: 100 mmol/L (ref 98–111)
Creatinine: 1.83 mg/dL — ABNORMAL HIGH (ref 0.44–1.00)
GFR, Estimated: 29 mL/min — ABNORMAL LOW (ref >60)
Glucose, Bld: 215 mg/dL — ABNORMAL HIGH (ref 70–99)
Potassium: 3.5 mmol/L (ref 3.5–5.1)
Sodium: 139 mmol/L (ref 135–145)
Total Bilirubin: 0.4 mg/dL (ref 0.3–1.2)
Total Protein: 7.2 g/dL (ref 6.5–8.1)

## 2022-04-26 LAB — SAVE SMEAR(SSMR), FOR PROVIDER SLIDE REVIEW

## 2022-04-26 LAB — D-DIMER, QUANTITATIVE: D-Dimer, Quant: 0.36 ug/mL-FEU (ref 0.00–0.50)

## 2022-04-26 LAB — FERRITIN: Ferritin: 683 ng/mL — ABNORMAL HIGH (ref 11–307)

## 2022-04-26 NOTE — Progress Notes (Signed)
Hematology and Oncology Follow Up Visit  [name] [id number] 05-18-1951 71 y.o. 04/26/2022   Principle Diagnosis:  Saddle pulmonary embolism-possible lupus anticoagulant Anemia secondary to renal insufficiency Potential liver mass versus bleed  Current Therapy:   Eliquis 5 mg p.o. twice daily     Interim History:  [name] is back for follow-up.  We last saw her back in May.  At that time, I had ordered an MRI of the liver, CT angiogram of the chest and a Doppler ultrasound of her legs.  Unfortunately, none have been done.  I am unsure as to why they were not done.  Apparently, they were scheduled but from what we found out, the patient was a no-show.  I have a hard time believing this given the fact that her family is very conscientious about making sure that she gets to her doctors appointments.  She seems to be managing.  She just had some dental work done.  She had tooth extractions.  I think she now has some dentures in.  There is still a little bit painful.  She cannot open her mouth a lot.  She had a normal alpha-fetoprotein more saw her back in May of less than 1.8.  She did have a positive lupus anticoagulant back in January when I first saw her.  I think we are repeating this.  It will be interesting to see what this shows.  Thankfully, her hemoglobin continues to improve.  I do believe that her iron studies should be okay.  Her appetite is all right.  Again since she had the dental procedure done, she is not eating a lot.  We really need to get the MRI and to see what is going on in the liver if anything.  When we had seen her in the hospital, there was a question of a liver mass versus hepatic bleeding.  She has had no headache.  There is been no fever.  She has had no obvious change in bowel or bladder habits.  Currently, I would say that her performance status is probably ECOG 1.  Medications:  Current Outpatient Medications:    acetaminophen (TYLENOL) 325 MG  tablet, Take 2 tablets (650 mg total) by mouth every 4 (four) hours as needed for fever (greater than 37.6 C)., Disp: , Rfl:    albuterol (VENTOLIN HFA) 108 (90 Base) MCG/ACT inhaler, Inhale 1-2 puffs into the lungs every 4 (four) hours as needed for wheezing or shortness of breath., Disp: 8.5 g, Rfl: 0   amLODipine (NORVASC) 10 MG tablet, Take by mouth daily., Disp: , Rfl:    apixaban (ELIQUIS) 5 MG TABS tablet, Take 1 tablet (5 mg total) by mouth 2 (two) times daily., Disp: 60 tablet, Rfl: 0   atorvastatin (LIPITOR) 20 MG tablet, Take 1 tablet (20 mg total) by mouth daily., Disp: 30 tablet, Rfl: 0   B Complex-C (B-COMPLEX WITH VITAMIN C) tablet, Take 1 tablet by mouth daily., Disp: 30 tablet, Rfl: 0   dicyclomine (BENTYL) 10 MG capsule, Take 1 capsule (10 mg total) by mouth 3 (three) times daily before meals., Disp: 90 capsule, Rfl: 0   DULoxetine (CYMBALTA) 20 MG capsule, Take 1 capsule (20 mg total) by mouth daily., Disp: 30 capsule, Rfl: 0   FLOVENT HFA 220 MCG/ACT inhaler, Inhale into the lungs., Disp: , Rfl:    folic acid (FOLVITE) 1 MG tablet, Take 2 tablets (2 mg total) by mouth daily., Disp: 60 tablet, Rfl: 0   Glucerna (  GLUCERNA) LIQD, Take 237 mLs by mouth daily., Disp: , Rfl:    lidocaine (LIDODERM) 5 %, Place 2 patches onto the skin daily. Remove & Discard patch within 12 hours or as directed by MD, Disp: 60 patch, Rfl: 0   melatonin 5 MG TABS, Take 1 tablet (5 mg total) by mouth every evening., Disp: 30 tablet, Rfl: 0   metoprolol tartrate (LOPRESSOR) 25 MG tablet, Take 0.5 tablets (12.5 mg total) by mouth 2 (two) times daily., Disp: 30 tablet, Rfl: 0   pantoprazole (PROTONIX) 40 MG tablet, Take 1 tablet (40 mg total) by mouth 2 (two) times daily., Disp: 60 tablet, Rfl: 0   temazepam (RESTORIL) 7.5 MG capsule, Take 1 capsule (7.5 mg total) by mouth at bedtime as needed for sleep., Disp: 30 capsule, Rfl: 0   Vitamin D, Ergocalciferol, (DRISDOL) 1.25 MG (50000 UNIT) CAPS capsule, Take 1  capsule (50,000 Units total) by mouth once a week., Disp: 5 capsule, Rfl: 0   PARoxetine (PAXIL) 40 MG tablet, Take by mouth daily., Disp: , Rfl:   Allergies:  Allergies  Allergen Reactions   Darvon [Propoxyphene] Itching   Hydrocodone Bit-Homatrop Mbr Itching   Metformin Nausea And Vomiting   Pentazocine Other (See Comments)      jitters   Percocet [Oxycodone-Acetaminophen] Itching   Sulfa Antibiotics Hives   Tetracaine Hcl Itching   Tetracyclines & Related Hives   Tramadol Itching   Benadryl [Diphenhydramine]     Can only take dye free. States the dye causes itching.   Other     Other reaction(s): Unknown. States reaction was to nasal spray that caused pupils to shrink   Aspirin Nausea And Vomiting    Past Medical History, Surgical history, Social history, and Family History were reviewed and updated.  Review of Systems: Review of Systems  Constitutional:  Positive for fatigue.  HENT:  Negative.    Eyes: Negative.   Respiratory: Negative.    Cardiovascular:  Positive for leg swelling.  Gastrointestinal: Negative.   Endocrine: Negative.   Genitourinary: Negative.    Musculoskeletal: Negative.   Skin: Negative.   Neurological: Negative.   Hematological: Negative.   Psychiatric/Behavioral: Negative.      Physical Exam:  height is '5\' 5"'$  (1.651 m) and weight is 112 lb (50.8 kg). Her oral temperature is 98.6 F (37 C). Her blood pressure is 118/72 and her pulse is 105 (abnormal). Her respiration is 18 and oxygen saturation is 100%.   Wt Readings from Last 3 Encounters:  04/26/22 112 lb (50.8 kg)  03/20/22 119 lb (54 kg)  02/14/22 126 lb 6.4 oz (57.3 kg)    Physical Exam Vitals reviewed.  HENT:     Head: Normocephalic and atraumatic.  Eyes:     Pupils: Pupils are equal, round, and reactive to light.  Cardiovascular:     Rate and Rhythm: Normal rate and regular rhythm.     Heart sounds: Normal heart sounds.  Pulmonary:     Effort: Pulmonary effort is normal.      Breath sounds: Normal breath sounds.  Abdominal:     General: Bowel sounds are normal.     Palpations: Abdomen is soft.  Musculoskeletal:        General: No tenderness or deformity. Normal range of motion.     Cervical back: Normal range of motion.  Lymphadenopathy:     Cervical: No cervical adenopathy.  Skin:    General: Skin is warm and dry.     Findings: No erythema  or rash.  Neurological:     Mental Status: She is alert and oriented to person, place, and time.  Psychiatric:        Behavior: Behavior normal.        Thought Content: Thought content normal.        Judgment: Judgment normal.     Lab Results  Component Value Date   WBC 7.7 04/26/2022   HGB 10.9 (L) 04/26/2022   HCT 34.1 (L) 04/26/2022   MCV 84.2 04/26/2022   PLT 450 (H) 04/26/2022     Chemistry      Component Value Date/Time   NA 139 04/26/2022 0952   NA 139 01/23/2022 0000   K 3.5 04/26/2022 0952   CL 100 04/26/2022 0952   CO2 30 04/26/2022 0952   BUN 15 04/26/2022 0952   BUN 23 (A) 01/23/2022 0000   CREATININE 1.83 (H) 04/26/2022 0952   CREATININE 1.14 (H) 03/06/2017 1502   GLU 138 01/23/2022 0000      Component Value Date/Time   CALCIUM 9.9 04/26/2022 0952   ALKPHOS 62 04/26/2022 0952   AST 16 04/26/2022 0952   ALT 11 04/26/2022 0952   BILITOT 0.4 04/26/2022 0952      Impression and Plan: Ms. Kopec is a very nice 71 year old Afro-American female.  She came in with a cardiac arrest.  She had a pulmonary embolism that was a saddle embolus.  She managed to get through this.  She had temporary renal failure.  She had temporary hepatic failure.  Thankfully, she was able to get off all of the supporting machines.  We found that she did have a lupus anticoagulant.  Again this certainly could be a pathologic lupus anticoagulant.  We will have to see what the studies show today.  She is on Eliquis right now.  I would like to repeat a CT angiogram of her chest.  Unfortunately, her renal function  just does not as good as it used to be.  As such, I think what I have told off on the angiogram for right now until we can see her renal function better.    We also need to get the MRI of the liver to make sure nothing is going on with the liver that we have to address.  Her face has remained incredibly strong.  I must give her a lot of credit for this.  I will plan to get her back to see her in another month or so.  I still think we have to follow-up with her procedures to make sure that we are not overlooking any issue.     Volanda Napoleon, MD 6/30/202311:08 AM

## 2022-04-27 LAB — DRVVT CONFIRM: dRVVT Confirm: 1.1 ratio (ref 0.8–1.2)

## 2022-04-27 LAB — DRVVT MIX: dRVVT Mix: 73.5 s — ABNORMAL HIGH (ref 0.0–40.4)

## 2022-04-27 LAB — LUPUS ANTICOAGULANT PANEL
DRVVT: 127.6 s — ABNORMAL HIGH (ref 0.0–47.0)
PTT Lupus Anticoagulant: 41.2 s (ref 0.0–43.5)

## 2022-04-27 LAB — CARDIOLIPIN ANTIBODIES, IGG, IGM, IGA
Anticardiolipin IgA: <9 APL U/mL (ref 0–11)
Anticardiolipin IgG: <9 GPL U/mL (ref 0–14)
Anticardiolipin IgM: <9 MPL U/mL (ref 0–12)

## 2022-05-01 ENCOUNTER — Ambulatory Visit (HOSPITAL_COMMUNITY)
Admission: RE | Admit: 2022-05-01 | Discharge: 2022-05-01 | Disposition: A | Discharge status: Home / Self Care | Payer: Medicare PPO | Source: Ambulatory Visit | Attending: Hematology & Oncology | Admitting: Hematology & Oncology

## 2022-05-01 DIAGNOSIS — I2602 Saddle embolus of pulmonary artery with acute cor pulmonale: Secondary | ICD-10-CM | POA: Insufficient documentation

## 2022-05-02 ENCOUNTER — Encounter: Payer: Self-pay | Admitting: *Deleted

## 2022-05-04 ENCOUNTER — Other Ambulatory Visit (HOSPITAL_BASED_OUTPATIENT_CLINIC_OR_DEPARTMENT_OTHER): Payer: Medicare PPO

## 2022-05-09 DIAGNOSIS — F419 Anxiety disorder, unspecified: Secondary | ICD-10-CM | POA: Diagnosis not present

## 2022-05-09 DIAGNOSIS — N182 Chronic kidney disease, stage 2 (mild): Secondary | ICD-10-CM | POA: Diagnosis not present

## 2022-05-09 DIAGNOSIS — Z131 Encounter for screening for diabetes mellitus: Secondary | ICD-10-CM | POA: Diagnosis not present

## 2022-05-09 DIAGNOSIS — R160 Hepatomegaly, not elsewhere classified: Secondary | ICD-10-CM | POA: Diagnosis not present

## 2022-05-09 DIAGNOSIS — E1121 Type 2 diabetes mellitus with diabetic nephropathy: Secondary | ICD-10-CM | POA: Diagnosis not present

## 2022-05-09 DIAGNOSIS — I129 Hypertensive chronic kidney disease with stage 1 through stage 4 chronic kidney disease, or unspecified chronic kidney disease: Secondary | ICD-10-CM | POA: Diagnosis not present

## 2022-05-09 DIAGNOSIS — E559 Vitamin D deficiency, unspecified: Secondary | ICD-10-CM | POA: Diagnosis not present

## 2022-05-09 DIAGNOSIS — I2699 Other pulmonary embolism without acute cor pulmonale: Secondary | ICD-10-CM | POA: Diagnosis not present

## 2022-05-09 DIAGNOSIS — G4700 Insomnia, unspecified: Secondary | ICD-10-CM | POA: Diagnosis not present

## 2022-05-09 DIAGNOSIS — I468 Cardiac arrest due to other underlying condition: Secondary | ICD-10-CM | POA: Diagnosis not present

## 2022-05-09 DIAGNOSIS — F321 Major depressive disorder, single episode, moderate: Secondary | ICD-10-CM | POA: Diagnosis not present

## 2022-05-11 ENCOUNTER — Other Ambulatory Visit: Payer: Self-pay | Admitting: Adult Health

## 2022-05-11 DIAGNOSIS — E7800 Pure hypercholesterolemia, unspecified: Secondary | ICD-10-CM

## 2022-05-11 DIAGNOSIS — I2782 Chronic pulmonary embolism: Secondary | ICD-10-CM

## 2022-05-11 DIAGNOSIS — M797 Fibromyalgia: Secondary | ICD-10-CM

## 2022-05-23 ENCOUNTER — Ambulatory Visit (INDEPENDENT_AMBULATORY_CARE_PROVIDER_SITE_OTHER): Payer: Medicare PPO | Admitting: Neurology

## 2022-05-23 ENCOUNTER — Encounter: Payer: Self-pay | Admitting: Neurology

## 2022-05-23 VITALS — BP 130/89 | HR 61 | Ht 65.0 in | Wt 117.5 lb

## 2022-05-23 DIAGNOSIS — M21371 Foot drop, right foot: Secondary | ICD-10-CM

## 2022-05-23 DIAGNOSIS — R269 Unspecified abnormalities of gait and mobility: Secondary | ICD-10-CM | POA: Diagnosis not present

## 2022-05-23 DIAGNOSIS — G3184 Mild cognitive impairment, so stated: Secondary | ICD-10-CM

## 2022-05-23 DIAGNOSIS — G4720 Circadian rhythm sleep disorder, unspecified type: Secondary | ICD-10-CM

## 2022-05-23 DIAGNOSIS — F518 Other sleep disorders not due to a substance or known physiological condition: Secondary | ICD-10-CM

## 2022-05-23 NOTE — Patient Instructions (Addendum)
Continue current medications Continue with aggressive physical therapy Use a walker with ambulation Do not drive a car at the moment Follow-up in 57-month or sooner if worse

## 2022-05-23 NOTE — Progress Notes (Signed)
[facility]  PATIENT: [name] DOB: 21-Jan-1951  REQUESTING CLINICIAN: [name], MD HISTORY FROM: Patient and daughter  REASON FOR VISIT: Memory problems   HISTORICAL  CHIEF COMPLAINT:  Chief Complaint  Patient presents with   New Patient (Initial Visit)    Rm 12. Accompanied by daughter, [name]. NP/Paper/Elaine Laurann Montana MD [facility].    HISTORY OF PRESENT ILLNESS:  This is a 71 year old woman past medical history of CAD, MI, hypertension, hyperlipidemia, atrial fibrillation and chronic insomnia who is presenting for memory problem Per daughter patient has 3 MI in December/January time.  Prior to that she was independent, was living alone, paying her own bills and independent in all ADLs.  Since being discharged from the hospital, and rehab patient has been living with her daughter.  Daughter has been handling her bills.  Initially she was asking about her deceased mother who died in 01-20-99, patient reports having memory loss during that time, like a global amnesia picture.  But since her health is overall improving, she is regaining some memory.  She currently does not drive.  Daughter said one day, patient went outside and bought cigarette and when daughter asked about it, patient reported that she had the cigarettes in her purse for a long time which is not true, daughter confiscated the cigarette and patient got upset, stating that she was going to leave the house attempting to take a car.  Patient did not end up leaving the house.  When asked about the situation today she states she was just very upset.  Daughter said that she can be forgetful, repeat the same stories and ask the same questions but this is also improving.   TBI:   No past history of TBI Stroke:   no past history of stroke Seizures:   no past history of seizures Sleep:  Sleep/awake cycle disruption. Long history of insomnia.  Mood: Diagnosed with depression.    Functional status: Dependent in some ADS Patient lives with daughter  Cooking: Sometimes patient but has forgotten to turn the oven off  Cleaning: no  Shopping: no  Bathing: patient, no help needed Toileting: patient, no help needed Driving: currently not driving  Bills: daughter paying them   Ever left the stove on by accident?: yes Forget how to use items around the house?: no Getting lost going to familiar places?: no Forgetting loved ones names?: no Word finding difficulty? yes Sleep: bad, awake all night and sleep during the day    OTHER MEDICAL CONDITIONS: CAD, MI, Hypertension, Hyperlipidemia, Atrial fibrillation, Depression, Insomnia    REVIEW OF SYSTEMS: Full 14 system review of systems performed and negative with exception of: as noted in the HPI   ALLERGIES: Allergies  Allergen Reactions   Darvon [Propoxyphene] Itching   Hydrocodone Bit-Homatrop Mbr Itching   Metformin Nausea And Vomiting   Pentazocine Other (See Comments)      jitters   Percocet [Oxycodone-Acetaminophen] Itching   Sulfa Antibiotics Hives   Tetracaine Hcl Itching   Tetracyclines & Related Hives   Tramadol Itching   Benadryl [Diphenhydramine]     Can only take dye free. States the dye causes itching.   Other     Other reaction(s): Unknown. States reaction was to nasal spray that caused pupils to shrink   Aspirin Nausea And Vomiting    HOME MEDICATIONS: Outpatient Medications Prior to Visit  Medication Sig Dispense Refill   acetaminophen (TYLENOL) 325 MG tablet Take 2 tablets (650 mg total) by  mouth every 4 (four) hours as needed for fever (greater than 37.6 C).     albuterol (VENTOLIN HFA) 108 (90 Base) MCG/ACT inhaler Inhale 1-2 puffs into the lungs every 4 (four) hours as needed for wheezing or shortness of breath. 8.5 g 0   amLODipine (NORVASC) 10 MG tablet Take by mouth daily.     apixaban (ELIQUIS) 5 MG TABS tablet Take 1 tablet (5 mg total) by mouth 2 (two) times daily. 60 tablet 0    atorvastatin (LIPITOR) 20 MG tablet Take 1 tablet (20 mg total) by mouth daily. 30 tablet 0   B Complex-C (B-COMPLEX WITH VITAMIN C) tablet Take 1 tablet by mouth daily. 30 tablet 0   dicyclomine (BENTYL) 10 MG capsule Take 1 capsule (10 mg total) by mouth 3 (three) times daily before meals. 90 capsule 0   DULoxetine (CYMBALTA) 20 MG capsule Take 1 capsule (20 mg total) by mouth daily. 30 capsule 0   FLOVENT HFA 220 MCG/ACT inhaler Inhale into the lungs.     folic acid (FOLVITE) 1 MG tablet Take 2 tablets (2 mg total) by mouth daily. 60 tablet 0   Glucerna (GLUCERNA) LIQD Take 237 mLs by mouth daily.     lidocaine (LIDODERM) 5 % Place 2 patches onto the skin daily. Remove & Discard patch within 12 hours or as directed by MD 60 patch 0   melatonin 5 MG TABS Take 1 tablet (5 mg total) by mouth every evening. 30 tablet 0   pantoprazole (PROTONIX) 40 MG tablet Take 1 tablet (40 mg total) by mouth 2 (two) times daily. 60 tablet 0   PARoxetine (PAXIL) 40 MG tablet Take by mouth daily.     temazepam (RESTORIL) 7.5 MG capsule Take 1 capsule (7.5 mg total) by mouth at bedtime as needed for sleep. 30 capsule 0   Vitamin D, Ergocalciferol, (DRISDOL) 1.25 MG (50000 UNIT) CAPS capsule Take 1 capsule (50,000 Units total) by mouth once a week. 5 capsule 0   metoprolol tartrate (LOPRESSOR) 25 MG tablet Take 0.5 tablets (12.5 mg total) by mouth 2 (two) times daily. 30 tablet 0   No facility-administered medications prior to visit.    PAST MEDICAL HISTORY: Past Medical History:  Diagnosis Date   Anxiety    Arthritis    Asthma    Cardiac arrest ([place]) 10/28/2021   Depression    Diabetes mellitus without complication (HCC)    Fibromyalgia    GERD (gastroesophageal reflux disease)    Hyperlipemia    Hypertension    PONV (postoperative nausea and vomiting)     PAST SURGICAL HISTORY: Past Surgical History:  Procedure Laterality Date   COLONOSCOPY     COLONOSCOPY WITH PROPOFOL N/A 02/20/2022    Procedure: COLONOSCOPY WITH PROPOFOL;  Surgeon: Otis Brace, MD;  Location: [place];  Service: Gastroenterology;  Laterality: N/A;   DILATION AND CURETTAGE OF UTERUS     ESOPHAGOGASTRODUODENOSCOPY N/A 02/20/2022   Procedure: ESOPHAGOGASTRODUODENOSCOPY (EGD);  Surgeon: Otis Brace, MD;  [facility] ENDOSCOPY;  Service: Gastroenterology;  Laterality: N/A;   IR EMBO ART  VEN HEMORR LYMPH EXTRAV  INC GUIDE ROADMAPPING  11/01/2021   IR FLUORO GUIDE CV LINE RIGHT  11/20/2021   IR REMOVAL TUN CV CATH W/O FL  12/20/2021   IR THROMBECT PRIM MECH INIT (INCLU) MOD SED  10/29/2021   IR US GUIDE VASC ACCESS RIGHT  10/29/2021   IR US GUIDE VASC ACCESS RIGHT  11/01/2021   IR US GUIDE VASC ACCESS RIGHT  11/21/2021   KNEE ARTHROSCOPY  [id number]   left   POLYPECTOMY  02/20/2022   Procedure: POLYPECTOMY;  Surgeon: Otis Brace, MD;  Location: [place];  Service: Gastroenterology;;   SHOULDER ARTHROSCOPY     left   TRIGGER FINGER RELEASE  08/13/2012   Procedure: RELEASE TRIGGER FINGER/A-1 PULLEY;  Surgeon: [name]., MD;  Location: [place];  Service: Orthopedics;  Laterality: Right;  long finger   TUBAL LIGATION      FAMILY HISTORY: Family History  Problem Relation Age of Onset   Diabetes Mother    Heart attack Father    Diabetes Sister    Throat cancer Brother    Hypertension Daughter     SOCIAL HISTORY: Social History   Socioeconomic History   Marital status: Divorced    Spouse name: Not on file   Number of children: Not on file   Years of education: Not on file   Highest education level: Not on file  Occupational History   Not on file  Tobacco Use   Smoking status: Former    Packs/day: 0.50    Years: 20.00    Total pack years: 10.00    Types: Cigarettes    Quit date: 08/10/2010    Years since quitting: 11.7   Smokeless tobacco: Never  Vaping Use   Vaping Use: Never used  Substance and Sexual Activity   Alcohol use: No   Drug use: No    Sexual activity: Not on file  Other Topics Concern   Not on file  Social History Narrative   Not on file   Social Determinants of Health   Financial Resource Strain: Not on file  Food Insecurity: Not on file  Transportation Needs: Not on file  Physical Activity: Not on file  Stress: Not on file  Social Connections: Not on file  Intimate Partner Violence: Not on file    PHYSICAL EXAM  GENERAL EXAM/CONSTITUTIONAL: Vitals:  Vitals:   05/23/22 1528  BP: 130/89  Pulse: 61  Weight: 117 lb 8 oz (53.3 kg)  Height: '5\' 5"'$  (1.651 m)   Body mass index is 19.55 kg/m. Wt Readings from Last 3 Encounters:  05/23/22 117 lb 8 oz (53.3 kg)  04/26/22 112 lb (50.8 kg)  03/20/22 119 lb (54 kg)   Patient is in no distress; well developed, nourished and groomed; neck is supple  EYES: Pupils round and reactive to light, Visual fields full to confrontation, Extraocular movements intacts,   MUSCULOSKELETAL: Gait, strength, tone, movements noted in Neurologic exam below  NEUROLOGIC: MENTAL STATUS:      No data to display            05/23/2022    3:29 PM  Montreal Cognitive Assessment   Visuospatial/ Executive (0/5) 4  Naming (0/3) 3  Attention: Read list of digits (0/2) 2  Attention: Read list of letters (0/1) 1  Attention: Serial 7 subtraction starting at 100 (0/3) 3  Language: Repeat phrase (0/2) 2  Language : Fluency (0/1) 1  Abstraction (0/2) 2  Delayed Recall (0/5) 3  Orientation (0/6) 5  Total 26    CRANIAL NERVE:  2nd, 3rd, 4th, 6th - pupils equal and reactive to light, visual fields full to confrontation, extraocular muscles intact, no nystagmus 5th - facial sensation symmetric 7th - facial strength symmetric 8th - hearing intact 9th - palate elevates symmetrically, uvula midline 11th - shoulder shrug symmetric 12th - tongue protrusion midline  MOTOR:  normal bulk and  tone, full strength in the BUE, BLE except for right foot decrease dorsiflexion and eversion  4/5 consistent with right foot drop   SENSORY:  normal and symmetric to light touch  COORDINATION:  finger-nose-finger, fine finger movements normal  GAIT/STATION:  In a wheelchair but able to stand unassisted and able to walk with assistance. She has a high steppage gait   DIAGNOSTIC DATA (LABS, IMAGING, TESTING) - I reviewed patient records, labs, notes, testing and imaging myself where available.  Lab Results  Component Value Date   WBC 7.7 04/26/2022   HGB 10.9 (L) 04/26/2022   HCT 34.1 (L) 04/26/2022   MCV 84.2 04/26/2022   PLT 450 (H) 04/26/2022      Component Value Date/Time   NA 139 04/26/2022 0952   NA 139 01/23/2022 0000   K 3.5 04/26/2022 0952   CL 100 04/26/2022 0952   CO2 30 04/26/2022 0952   GLUCOSE 215 (H) 04/26/2022 0952   BUN 15 04/26/2022 0952   BUN 23 (A) 01/23/2022 0000   CREATININE 1.83 (H) 04/26/2022 0952   CREATININE 1.14 (H) 03/06/2017 1502   CALCIUM 9.9 04/26/2022 0952   PROT 7.2 04/26/2022 0952   ALBUMIN 3.6 04/26/2022 0952   AST 16 04/26/2022 0952   ALT 11 04/26/2022 0952   ALKPHOS 62 04/26/2022 0952   BILITOT 0.4 04/26/2022 0952   GFRNONAA 29 (L) 04/26/2022 0952   GFRNONAA 50 (L) 03/06/2017 1502   GFRAA 51 (L) 04/11/2019 1121   GFRAA 57 (L) 03/06/2017 1502   Lab Results  Component Value Date   TRIG 154 (H) 11/15/2021   Lab Results  Component Value Date   HGBA1C 6.3 (H) 01/09/2022   Lab Results  Component Value Date   VITAMINB12 1,448 (H) 02/22/2022   Lab Results  Component Value Date   TSH 1.26 01/23/2022    MRI Brain 01/09/22 1. No evidence of acute intracranial abnormality. 2. Mild chronic microvascular ischemic disease    ASSESSMENT AND PLAN  71 y.o. year old female with CAD, MI, hypertension, hyperlipidemia, atrial fibrillation and chronic insomnia who is presenting for memory problem.  Per daughter the memory problem started after patient being home following 3 MIs.  Prior to her MI, patient was independent in all  ADLs and IADLs, she was even driving at that time.  She is still convalescent from her heart attacks, she does have a right foot drop for which she is planning to start PT.  Daughter reports memory problem described initially as amnesia, followed by being forgetful asking the same questions, repeating the same story but these also are improving.  Today on exam she scored 26 out of 30 on the [place] which is normal.  I have explained to patient and daughter that she does not have dementia, possibly cognitive impairment is due to medical condition.  Plan for now is to continue current medications, continue with physical therapy for gait abnormality and right foot drop and trial of benzodiazepine for sleeping.  Patient does have benzodiazepine that she takes as needed for anxiety but I have asked her to try it to see if that can help her with her sleep.  Continue to follow with PCP and return in 1 year or sooner if worse.   1. Mild cognitive impairment   2. Disrupted sleep-wake cycle   3. Right foot drop   4. Gait abnormality      Patient Instructions  Continue current medications Continue with aggressive physical therapy Use a walker with ambulation  Do not drive a car at the moment Follow-up in 57-month or sooner if worse  No orders of the defined types were placed in this encounter.   No orders of the defined types were placed in this encounter.   Return in about 6 months (around 11/23/2022).  I have spent a total of 65 minutes dedicated to this patient today, preparing to see patient, performing a medically appropriate examination and evaluation, ordering tests and/or medications and procedures, and counseling and educating the patient/family/caregiver; independently interpreting result and communicating results to the family/patient/caregiver; and documenting clinical information in the electronic medical record.   AAlric Ran MD 05/23/2022, 8:36 PM  [facility] [address] [place] [phone]

## 2022-05-31 ENCOUNTER — Telehealth: Payer: Self-pay | Admitting: Family Medicine

## 2022-05-31 NOTE — Telephone Encounter (Signed)
   [name] DOB: 1950-12-05 MRN: [id number]   RIDER WAIVER AND RELEASE OF LIABILITY  For purposes of improving physical access to our facilities, [facility] is pleased to partner with third parties to provide [place] patients or other authorized individuals the option of convenient, on-demand ground transportation services (the [place]") through use of the technology service that enables users to request on-demand ground transportation from independent third-party providers.  By opting to use and accept these [employer], I, the undersigned, hereby agree on behalf of myself, and on behalf of any minor child using the Government social research officer for whom I am the parent or legal guardian, as follows:  Government social research officer provided to me are provided by independent third-party transportation providers who are not [employer] or employees and who are unaffiliated with [employer]. [facility] is neither a transportation carrier nor a common or public carrier. [facility] has no control over the quality or safety of the transportation that occurs as a result of the [employer]. [facility] cannot guarantee that any third-party transportation provider will complete any arranged transportation service. [facility] makes no representation, warranty, or guarantee regarding the reliability, timeliness, quality, safety, suitability, or availability of any of the Transport Services or that they will be error free. I fully understand that traveling by vehicle involves risks and dangers of serious bodily injury, including permanent disability, paralysis, and death. I agree, on behalf of myself and on behalf of any minor child using the Transport Services for whom I am the parent or legal guardian, that the entire risk arising out of my use of the [employer] remains solely with me, to the maximum extent permitted under applicable law. The [employer] are provided "as  is" and "as available." [facility] disclaims all representations and warranties, express, implied or statutory, not expressly set out in these terms, including the implied warranties of merchantability and fitness for a particular purpose. I hereby waive and release [facility], its agents, employees, officers, directors, representatives, insurers, attorneys, assigns, successors, subsidiaries, and affiliates from any and all past, present, or future claims, demands, liabilities, actions, causes of action, or suits of any kind directly or indirectly arising from acceptance and use of the [employer]. I further waive and release [facility] and its affiliates from all present and future liability and responsibility for any injury or death to persons or damages to property caused by or related to the use of the [employer]. I have read this Waiver and Release of Liability, and I understand the terms used in it and their legal significance. This Waiver is freely and voluntarily given with the understanding that my right (as well as the right of any minor child for whom I am the parent or legal guardian using the [employer]) to legal recourse against [facility] in connection with the [employer] is knowingly surrendered in return for use of these services.   I attest that I read the consent document to [name], gave Ms. Burrous the opportunity to ask questions and answered the questions asked (if any). I affirm that [name] then provided consent for she's participation in this program.     [name]

## 2022-06-02 ENCOUNTER — Emergency Department (HOSPITAL_COMMUNITY): Payer: Medicare PPO

## 2022-06-02 ENCOUNTER — Encounter (HOSPITAL_COMMUNITY): Payer: Self-pay | Admitting: Internal Medicine

## 2022-06-02 ENCOUNTER — Inpatient Hospital Stay (HOSPITAL_COMMUNITY): Payer: Medicare PPO

## 2022-06-02 ENCOUNTER — Inpatient Hospital Stay (HOSPITAL_COMMUNITY)
Admission: EM | Admit: 2022-06-02 | Discharge: 2022-06-09 | DRG: 377 | Disposition: A | Discharge status: Home Health | Payer: Medicare PPO | Attending: Internal Medicine | Admitting: Internal Medicine

## 2022-06-02 ENCOUNTER — Encounter: Payer: Self-pay | Admitting: Hematology & Oncology

## 2022-06-02 DIAGNOSIS — N189 Chronic kidney disease, unspecified: Secondary | ICD-10-CM | POA: Diagnosis not present

## 2022-06-02 DIAGNOSIS — E440 Moderate protein-calorie malnutrition: Secondary | ICD-10-CM | POA: Diagnosis not present

## 2022-06-02 DIAGNOSIS — N184 Chronic kidney disease, stage 4 (severe): Secondary | ICD-10-CM | POA: Diagnosis not present

## 2022-06-02 DIAGNOSIS — R58 Hemorrhage, not elsewhere classified: Secondary | ICD-10-CM | POA: Diagnosis not present

## 2022-06-02 DIAGNOSIS — Z681 Body mass index (BMI) 19 or less, adult: Secondary | ICD-10-CM

## 2022-06-02 DIAGNOSIS — Z7901 Long term (current) use of anticoagulants: Secondary | ICD-10-CM

## 2022-06-02 DIAGNOSIS — K921 Melena: Secondary | ICD-10-CM | POA: Diagnosis not present

## 2022-06-02 DIAGNOSIS — E1122 Type 2 diabetes mellitus with diabetic chronic kidney disease: Secondary | ICD-10-CM | POA: Diagnosis not present

## 2022-06-02 DIAGNOSIS — E1169 Type 2 diabetes mellitus with other specified complication: Secondary | ICD-10-CM | POA: Diagnosis present

## 2022-06-02 DIAGNOSIS — K922 Gastrointestinal hemorrhage, unspecified: Secondary | ICD-10-CM | POA: Diagnosis present

## 2022-06-02 DIAGNOSIS — Z888 Allergy status to other drugs, medicaments and biological substances status: Secondary | ICD-10-CM

## 2022-06-02 DIAGNOSIS — Z7189 Other specified counseling: Secondary | ICD-10-CM | POA: Diagnosis not present

## 2022-06-02 DIAGNOSIS — E119 Type 2 diabetes mellitus without complications: Secondary | ICD-10-CM | POA: Diagnosis not present

## 2022-06-02 DIAGNOSIS — K649 Unspecified hemorrhoids: Secondary | ICD-10-CM | POA: Diagnosis not present

## 2022-06-02 DIAGNOSIS — Z87891 Personal history of nicotine dependence: Secondary | ICD-10-CM

## 2022-06-02 DIAGNOSIS — I82402 Acute embolism and thrombosis of unspecified deep veins of left lower extremity: Secondary | ICD-10-CM | POA: Diagnosis not present

## 2022-06-02 DIAGNOSIS — R404 Transient alteration of awareness: Secondary | ICD-10-CM | POA: Diagnosis not present

## 2022-06-02 DIAGNOSIS — Z794 Long term (current) use of insulin: Secondary | ICD-10-CM | POA: Diagnosis not present

## 2022-06-02 DIAGNOSIS — G8929 Other chronic pain: Secondary | ICD-10-CM | POA: Diagnosis present

## 2022-06-02 DIAGNOSIS — Z8674 Personal history of sudden cardiac arrest: Secondary | ICD-10-CM

## 2022-06-02 DIAGNOSIS — J45909 Unspecified asthma, uncomplicated: Secondary | ICD-10-CM | POA: Diagnosis not present

## 2022-06-02 DIAGNOSIS — Z886 Allergy status to analgesic agent status: Secondary | ICD-10-CM | POA: Diagnosis not present

## 2022-06-02 DIAGNOSIS — R6889 Other general symptoms and signs: Secondary | ICD-10-CM | POA: Diagnosis not present

## 2022-06-02 DIAGNOSIS — Z66 Do not resuscitate: Secondary | ICD-10-CM | POA: Diagnosis present

## 2022-06-02 DIAGNOSIS — R54 Age-related physical debility: Secondary | ICD-10-CM | POA: Diagnosis present

## 2022-06-02 DIAGNOSIS — K7689 Other specified diseases of liver: Secondary | ICD-10-CM | POA: Diagnosis not present

## 2022-06-02 DIAGNOSIS — D62 Acute posthemorrhagic anemia: Secondary | ICD-10-CM | POA: Diagnosis present

## 2022-06-02 DIAGNOSIS — I10 Essential (primary) hypertension: Secondary | ICD-10-CM | POA: Diagnosis present

## 2022-06-02 DIAGNOSIS — R531 Weakness: Secondary | ICD-10-CM | POA: Diagnosis not present

## 2022-06-02 DIAGNOSIS — Z833 Family history of diabetes mellitus: Secondary | ICD-10-CM

## 2022-06-02 DIAGNOSIS — J449 Chronic obstructive pulmonary disease, unspecified: Secondary | ICD-10-CM | POA: Diagnosis present

## 2022-06-02 DIAGNOSIS — I959 Hypotension, unspecified: Secondary | ICD-10-CM | POA: Diagnosis not present

## 2022-06-02 DIAGNOSIS — I700 Atherosclerosis of aorta: Secondary | ICD-10-CM | POA: Diagnosis not present

## 2022-06-02 DIAGNOSIS — Z86711 Personal history of pulmonary embolism: Secondary | ICD-10-CM | POA: Diagnosis not present

## 2022-06-02 DIAGNOSIS — I129 Hypertensive chronic kidney disease with stage 1 through stage 4 chronic kidney disease, or unspecified chronic kidney disease: Secondary | ICD-10-CM | POA: Diagnosis not present

## 2022-06-02 DIAGNOSIS — Z8249 Family history of ischemic heart disease and other diseases of the circulatory system: Secondary | ICD-10-CM

## 2022-06-02 DIAGNOSIS — R578 Other shock: Secondary | ICD-10-CM | POA: Diagnosis present

## 2022-06-02 DIAGNOSIS — N179 Acute kidney failure, unspecified: Secondary | ICD-10-CM | POA: Diagnosis not present

## 2022-06-02 DIAGNOSIS — Z79899 Other long term (current) drug therapy: Secondary | ICD-10-CM

## 2022-06-02 DIAGNOSIS — J9811 Atelectasis: Secondary | ICD-10-CM | POA: Diagnosis not present

## 2022-06-02 DIAGNOSIS — M797 Fibromyalgia: Secondary | ICD-10-CM | POA: Diagnosis present

## 2022-06-02 DIAGNOSIS — K5730 Diverticulosis of large intestine without perforation or abscess without bleeding: Secondary | ICD-10-CM | POA: Diagnosis not present

## 2022-06-02 DIAGNOSIS — D631 Anemia in chronic kidney disease: Secondary | ICD-10-CM | POA: Diagnosis present

## 2022-06-02 DIAGNOSIS — R739 Hyperglycemia, unspecified: Secondary | ICD-10-CM | POA: Diagnosis not present

## 2022-06-02 DIAGNOSIS — Z882 Allergy status to sulfonamides status: Secondary | ICD-10-CM | POA: Diagnosis not present

## 2022-06-02 DIAGNOSIS — E7800 Pure hypercholesterolemia, unspecified: Secondary | ICD-10-CM | POA: Diagnosis present

## 2022-06-02 DIAGNOSIS — D649 Anemia, unspecified: Secondary | ICD-10-CM | POA: Diagnosis not present

## 2022-06-02 DIAGNOSIS — K5731 Diverticulosis of large intestine without perforation or abscess with bleeding: Principal | ICD-10-CM | POA: Diagnosis present

## 2022-06-02 DIAGNOSIS — Z515 Encounter for palliative care: Secondary | ICD-10-CM

## 2022-06-02 DIAGNOSIS — K219 Gastro-esophageal reflux disease without esophagitis: Secondary | ICD-10-CM | POA: Diagnosis present

## 2022-06-02 DIAGNOSIS — Z885 Allergy status to narcotic agent status: Secondary | ICD-10-CM | POA: Diagnosis not present

## 2022-06-02 DIAGNOSIS — Z862 Personal history of diseases of the blood and blood-forming organs and certain disorders involving the immune mechanism: Secondary | ICD-10-CM

## 2022-06-02 DIAGNOSIS — Z743 Need for continuous supervision: Secondary | ICD-10-CM | POA: Diagnosis not present

## 2022-06-02 DIAGNOSIS — F32A Depression, unspecified: Secondary | ICD-10-CM | POA: Diagnosis present

## 2022-06-02 DIAGNOSIS — Z86718 Personal history of other venous thrombosis and embolism: Secondary | ICD-10-CM

## 2022-06-02 LAB — COMPREHENSIVE METABOLIC PANEL
ALT: 15 U/L (ref 0–44)
AST: 20 U/L (ref 15–41)
Albumin: 2.7 g/dL — ABNORMAL LOW (ref 3.5–5.0)
Alkaline Phosphatase: 58 U/L (ref 38–126)
Anion gap: 8 (ref 5–15)
BUN: 40 mg/dL — ABNORMAL HIGH (ref 8–23)
CO2: 22 mmol/L (ref 22–32)
Calcium: 8.3 mg/dL — ABNORMAL LOW (ref 8.9–10.3)
Chloride: 110 mmol/L (ref 98–111)
Creatinine, Ser: 2.19 mg/dL — ABNORMAL HIGH (ref 0.44–1.00)
GFR, Estimated: 23 mL/min — ABNORMAL LOW (ref >60)
Glucose, Bld: 293 mg/dL — ABNORMAL HIGH (ref 70–99)
Potassium: 4.3 mmol/L (ref 3.5–5.1)
Sodium: 140 mmol/L (ref 135–145)
Total Bilirubin: 0.4 mg/dL (ref 0.3–1.2)
Total Protein: 5.6 g/dL — ABNORMAL LOW (ref 6.5–8.1)

## 2022-06-02 LAB — CBC
HCT: 15.7 % — ABNORMAL LOW (ref 36.0–46.0)
Hemoglobin: 4.5 g/dL — CL (ref 12.0–15.0)
MCH: 26.9 pg (ref 26.0–34.0)
MCHC: 28.7 g/dL — ABNORMAL LOW (ref 30.0–36.0)
MCV: 94 fL (ref 80.0–100.0)
Platelets: 170 10*3/uL (ref 150–400)
RBC: 1.67 MIL/uL — ABNORMAL LOW (ref 3.87–5.11)
RDW: 14.8 % (ref 11.5–15.5)
WBC: 6 10*3/uL (ref 4.0–10.5)
nRBC: 0 % (ref 0.0–0.2)

## 2022-06-02 LAB — PREPARE RBC (CROSSMATCH)

## 2022-06-02 LAB — CBC WITH DIFFERENTIAL/PLATELET
Abs Immature Granulocytes: 0.01 10*3/uL (ref 0.00–0.07)
Basophils Absolute: 0.1 10*3/uL (ref 0.0–0.1)
Basophils Relative: 1 %
Eosinophils Absolute: 0.2 10*3/uL (ref 0.0–0.5)
Eosinophils Relative: 2 %
HCT: 28.2 % — ABNORMAL LOW (ref 36.0–46.0)
Hemoglobin: 8.9 g/dL — ABNORMAL LOW (ref 12.0–15.0)
Immature Granulocytes: 0 %
Lymphocytes Relative: 40 %
Lymphs Abs: 2.9 10*3/uL (ref 0.7–4.0)
MCH: 27.4 pg (ref 26.0–34.0)
MCHC: 31.6 g/dL (ref 30.0–36.0)
MCV: 86.8 fL (ref 80.0–100.0)
Monocytes Absolute: 0.7 10*3/uL (ref 0.1–1.0)
Monocytes Relative: 9 %
Neutro Abs: 3.6 10*3/uL (ref 1.7–7.7)
Neutrophils Relative %: 48 %
Platelets: 296 10*3/uL (ref 150–400)
RBC: 3.25 MIL/uL — ABNORMAL LOW (ref 3.87–5.11)
RDW: 14.5 % (ref 11.5–15.5)
WBC: 7.3 10*3/uL (ref 4.0–10.5)
nRBC: 0 % (ref 0.0–0.2)

## 2022-06-02 LAB — PROTIME-INR
INR: 1.3 — ABNORMAL HIGH (ref 0.8–1.2)
Prothrombin Time: 16.1 seconds — ABNORMAL HIGH (ref 11.4–15.2)

## 2022-06-02 MED ORDER — TEMAZEPAM 7.5 MG PO CAPS: 7.5000 mg | ORAL_CAPSULE | Freq: Every evening | ORAL | Status: DC | PRN

## 2022-06-02 MED ORDER — LACTATED RINGERS IV BOLUS
1000.0000 mL | Freq: Once | INTRAVENOUS | Status: AC
Start: 2022-06-02 — End: 2022-06-03
  Administered 2022-06-02: 1000 mL via INTRAVENOUS

## 2022-06-02 MED ORDER — ACETAMINOPHEN 325 MG PO TABS
650.0000 mg | ORAL_TABLET | Freq: Four times a day (QID) | ORAL | Status: DC | PRN
  Administered 2022-06-05: 650 mg via ORAL
  Filled 2022-06-02: qty 2

## 2022-06-02 MED ORDER — ALBUTEROL SULFATE (2.5 MG/3ML) 0.083% IN NEBU: 2.5000 mg | INHALATION_SOLUTION | RESPIRATORY_TRACT | Status: DC | PRN

## 2022-06-02 MED ORDER — TECHNETIUM TC 99M-LABELED RED BLOOD CELLS IV KIT
22.1000 | PACK | Freq: Once | INTRAVENOUS | Status: AC | PRN
  Administered 2022-06-02: 22.1 via INTRAVENOUS

## 2022-06-02 MED ORDER — PROTHROMBIN COMPLEX CONC HUMAN 500 UNITS IV KIT
2567.0000 [IU] | PACK | Status: AC
  Administered 2022-06-02: 2567 [IU] via INTRAVENOUS
  Filled 2022-06-02: qty 520

## 2022-06-02 MED ORDER — PANTOPRAZOLE 80MG IVPB - SIMPLE MED
80.0000 mg | Freq: Once | INTRAVENOUS | Status: AC
  Administered 2022-06-02: 80 mg via INTRAVENOUS
  Filled 2022-06-02: qty 100

## 2022-06-02 MED ORDER — ONDANSETRON HCL 4 MG PO TABS: 4.0000 mg | ORAL_TABLET | Freq: Four times a day (QID) | ORAL | Status: DC | PRN

## 2022-06-02 MED ORDER — SODIUM CHLORIDE 0.9 % IV BOLUS
1000.0000 mL | Freq: Once | INTRAVENOUS | Status: AC
  Administered 2022-06-02: 1000 mL via INTRAVENOUS

## 2022-06-02 MED ORDER — SODIUM CHLORIDE 0.9% FLUSH
3.0000 mL | Freq: Two times a day (BID) | INTRAVENOUS | Status: DC
Start: 2022-06-02 — End: 2022-06-09
  Administered 2022-06-02 – 2022-06-08 (×9): 3 mL via INTRAVENOUS

## 2022-06-02 MED ORDER — DULOXETINE HCL 20 MG PO CPEP
20.0000 mg | ORAL_CAPSULE | Freq: Every day | ORAL | Status: DC
  Administered 2022-06-03: 20 mg via ORAL
  Filled 2022-06-02 (×2): qty 1

## 2022-06-02 MED ORDER — BUDESONIDE 0.25 MG/2ML IN SUSP
0.2500 mg | Freq: Two times a day (BID) | RESPIRATORY_TRACT | Status: DC
Start: 2022-06-02 — End: 2022-06-09
  Administered 2022-06-03 (×3): 0.25 mg via RESPIRATORY_TRACT
  Filled 2022-06-02 (×10): qty 2

## 2022-06-02 MED ORDER — ONDANSETRON HCL 4 MG/2ML IJ SOLN
4.0000 mg | Freq: Four times a day (QID) | INTRAMUSCULAR | Status: DC | PRN
  Administered 2022-06-08: 4 mg via INTRAVENOUS

## 2022-06-02 MED ORDER — PANTOPRAZOLE SODIUM 40 MG IV SOLR
40.0000 mg | Freq: Once | INTRAVENOUS | Status: DC
Start: 2022-06-02 — End: 2022-06-02
  Filled 2022-06-02: qty 10

## 2022-06-02 MED ORDER — PAROXETINE HCL 20 MG PO TABS: 40.0000 mg | ORAL_TABLET | Freq: Every day | ORAL | Status: DC

## 2022-06-02 MED ORDER — ATORVASTATIN CALCIUM 10 MG PO TABS
20.0000 mg | ORAL_TABLET | Freq: Every day | ORAL | Status: DC
  Administered 2022-06-03 – 2022-06-09 (×6): 20 mg via ORAL
  Filled 2022-06-02 (×7): qty 2

## 2022-06-02 MED ORDER — SODIUM CHLORIDE 0.9 % IV SOLN
INTRAVENOUS | Status: DC
Start: 2022-06-02 — End: 2022-06-02

## 2022-06-02 MED ORDER — SODIUM CHLORIDE 0.9% IV SOLUTION: Freq: Once | INTRAVENOUS | Status: AC

## 2022-06-02 MED ORDER — MELATONIN 5 MG PO TABS
5.0000 mg | ORAL_TABLET | Freq: Every evening | ORAL | Status: DC
  Filled 2022-06-02 (×3): qty 1

## 2022-06-02 MED ORDER — PANTOPRAZOLE INFUSION (NEW) - SIMPLE MED
8.0000 mg/h | INTRAVENOUS | Status: DC
  Administered 2022-06-02: 8 mg/h via INTRAVENOUS
  Filled 2022-06-02: qty 80

## 2022-06-02 MED ORDER — PANTOPRAZOLE SODIUM 40 MG PO TBEC
40.0000 mg | DELAYED_RELEASE_TABLET | Freq: Two times a day (BID) | ORAL | Status: DC
  Administered 2022-06-03 – 2022-06-09 (×12): 40 mg via ORAL
  Filled 2022-06-02 (×14): qty 1

## 2022-06-02 MED ORDER — PANTOPRAZOLE SODIUM 40 MG IV SOLR
40.0000 mg | Freq: Once | INTRAVENOUS | Status: AC
  Administered 2022-06-02: 40 mg via INTRAVENOUS
  Filled 2022-06-02: qty 10

## 2022-06-02 MED ORDER — HYDRALAZINE HCL 20 MG/ML IJ SOLN: 5.0000 mg | INTRAMUSCULAR | Status: DC | PRN

## 2022-06-02 MED ORDER — LACTATED RINGERS IV SOLN: INTRAVENOUS | Status: DC

## 2022-06-02 MED ORDER — ACETAMINOPHEN 650 MG RE SUPP: 650.0000 mg | Freq: Four times a day (QID) | RECTAL | Status: DC | PRN

## 2022-06-02 NOTE — ED Triage Notes (Signed)
Pt BIB [facility] from home d/t GI bleed. Pt woke up, went to bathroom, has been having bloody diarrhea since this morning. Hx of colon polyps. EMS estimated about 300-400 ml blood loss w clots in th toilets. A&O X4.   752m NS given by EMS.  NSR w EMS.   BP- initial 80 on toilet. 110/62 supine pressure after fluids.  HR 80  CBG 324

## 2022-06-02 NOTE — H&P (Signed)
History and Physical    Patient: [name] HAL:[id number] DOB: 04/08/1951 DOA: 06/02/2022 DOS: the patient was seen and examined on 06/02/2022 PCP: [name], MD  Patient coming from: Home - lives with daughter and granddaughter; Donald Prose: Daughter, [phone]   Chief Complaint: GI bleeding  HPI: [name] is a 71 y.o. female with medical history significant of cardiac arrest in 10/2021; stage 4 CKD; DM; HTN; and HLD presenting with GI bleeding.  She was hospitalized from 1/1-2/7 for VF arrest, underwent mechanical thrombectomy due to massive PR.  That hospitalization was complicated by intrahepatic/intraperitoneal hemorrhage with DIC.  She was eventually started on  Eliquis.  She was discharged to [facility] and remained there through 3/13.  She was hospitalized again from 3/15-22 for refractory hypoglycemia.  She returned from 4/24-29 with lower GI bleeding from internal hemorrhoids.  She was a little weak yesterday.  She was somewhat off balance and was nauseated without vomiting later in the day.  She called her PCP and she called in Zofran.  This AM, she got up to take her medicine and went to the bathroom.  En route, she was very weak and dizzy.  When she made it to the bathroom, she had a gush of bleeding, dark blood.  She stayed on the commode, had a gush of bleeding.  This happened maybe 3-4 times with recurrent bleeding.  She is currently having her first episode in the ER.  No pain.  She was previously unwilling to accept blood products but is willing now.      ER Course:  GI bleeding, on Eliquis.  Started this AM. Took Eliquis today.  Hgb 8.9, down from 10.9.  Mild AKI.  [facility] is consulted.     Review of Systems: As mentioned in the history of present illness. All other systems reviewed and are negative. Past Medical History:  Diagnosis Date   Anxiety    Arthritis    Asthma    Cardiac arrest ([place]) 10/28/2021   Depression    Diabetes mellitus without complication (HCC)     Fibromyalgia    GERD (gastroesophageal reflux disease)    Hyperlipemia    Hypertension    PONV (postoperative nausea and vomiting)    Past Surgical History:  Procedure Laterality Date   COLONOSCOPY     COLONOSCOPY WITH PROPOFOL N/A 02/20/2022   Procedure: COLONOSCOPY WITH PROPOFOL;  Surgeon: Otis Brace, MD;  Location: [place] [facility];  Service: Gastroenterology;  Laterality: N/A;   DILATION AND CURETTAGE OF UTERUS     ESOPHAGOGASTRODUODENOSCOPY N/A 02/20/2022   Procedure: ESOPHAGOGASTRODUODENOSCOPY (EGD);  Surgeon: Otis Brace, MD;  Location[facility] ENDOSCOPY;  Service: Gastroenterology;  Laterality: N/A;   IR EMBO ART  VEN HEMORR LYMPH EXTRAV  INC GUIDE ROADMAPPING  11/01/2021   IR FLUORO GUIDE CV LINE RIGHT  11/20/2021   IR REMOVAL TUN CV CATH W/O FL  12/20/2021   IR THROMBECT PRIM MECH INIT (INCLU) MOD SED  10/29/2021   IR US GUIDE VASC ACCESS RIGHT  10/29/2021   IR US GUIDE VASC ACCESS RIGHT  11/01/2021   IR US GUIDE VASC ACCESS RIGHT  11/21/2021   KNEE ARTHROSCOPY  [id number]   left   POLYPECTOMY  02/20/2022   Procedure: POLYPECTOMY;  Surgeon: Otis Brace, MD;  Location: [facility];  Service: Gastroenterology;;   SHOULDER ARTHROSCOPY     left   TRIGGER FINGER RELEASE  08/13/2012   Procedure: RELEASE TRIGGER FINGER/A-1 PULLEY;  Surgeon: [name]., MD;  Location:  [place];  Service: Orthopedics;  Laterality: Right;  long finger   TUBAL LIGATION     Social History:  reports that she quit smoking about 11 years ago. Her smoking use included cigarettes. She has a 10.00 pack-year smoking history. She has never used smokeless tobacco. She reports that she does not drink alcohol and does not use drugs.  Allergies  Allergen Reactions   Darvon [Propoxyphene] Itching   Hydrocodone Bit-Homatrop Mbr Itching   Metformin Nausea And Vomiting   Pentazocine Other (See Comments)      jitters   Percocet [Oxycodone-Acetaminophen] Itching   Sulfa Antibiotics Hives    Tetracaine Hcl Itching   Tetracyclines & Related Hives   Tramadol Itching   Benadryl [Diphenhydramine]     Can only take dye free. States the dye causes itching.   Other     Other reaction(s): Unknown. States reaction was to nasal spray that caused pupils to shrink   Aspirin Nausea And Vomiting    Family History  Problem Relation Age of Onset   Diabetes Mother    Heart attack Father    Diabetes Sister    Throat cancer Brother    Hypertension Daughter     Prior to Admission medications   Medication Sig Start Date End Date Taking? Authorizing Provider  acetaminophen (TYLENOL) 325 MG tablet Take 2 tablets (650 mg total) by mouth every 4 (four) hours as needed for fever (greater than 37.6 C). 01/07/22   Angiulli, Lavon Paganini, PA-C  albuterol (VENTOLIN HFA) 108 (90 Base) MCG/ACT inhaler Inhale 1-2 puffs into the lungs every 4 (four) hours as needed for wheezing or shortness of breath. 02/14/22   Medina-Vargas, Monina C, NP  amLODipine (NORVASC) 10 MG tablet Take by mouth daily. 03/06/22   [provider]  apixaban (ELIQUIS) 5 MG TABS tablet Take 1 tablet (5 mg total) by mouth 2 (two) times daily. 02/14/22   Medina-Vargas, Monina C, NP  atorvastatin (LIPITOR) 20 MG tablet Take 1 tablet (20 mg total) by mouth daily. 02/14/22   Medina-Vargas, Monina C, NP  B Complex-C (B-COMPLEX WITH VITAMIN C) tablet Take 1 tablet by mouth daily. 01/08/22   Angiulli, Lavon Paganini, PA-C  dicyclomine (BENTYL) 10 MG capsule Take 1 capsule (10 mg total) by mouth 3 (three) times daily before meals. 02/14/22   Medina-Vargas, Monina C, NP  DULoxetine (CYMBALTA) 20 MG capsule Take 1 capsule (20 mg total) by mouth daily. 02/14/22   Medina-Vargas, Monina C, NP  FLOVENT HFA 220 MCG/ACT inhaler Inhale into the lungs. 03/01/22   [provider]  folic acid (FOLVITE) 1 MG tablet Take 2 tablets (2 mg total) by mouth daily. 02/14/22   Medina-Vargas, Monina C, NP  Glucerna (GLUCERNA) LIQD Take 237 mLs by mouth daily.     [provider]  lidocaine (LIDODERM) 5 % Place 2 patches onto the skin daily. Remove & Discard patch within 12 hours or as directed by MD 02/14/22   Medina-Vargas, Monina C, NP  melatonin 5 MG TABS Take 1 tablet (5 mg total) by mouth every evening. 02/14/22   Medina-Vargas, Monina C, NP  pantoprazole (PROTONIX) 40 MG tablet Take 1 tablet (40 mg total) by mouth 2 (two) times daily. 02/14/22   Medina-Vargas, Monina C, NP  PARoxetine (PAXIL) 40 MG tablet Take by mouth daily. 03/06/22   [provider]  temazepam (RESTORIL) 7.5 MG capsule Take 1 capsule (7.5 mg total) by mouth at bedtime as needed for sleep. 03/20/22   Ennever,  Rudell Cobb, MD  Vitamin D, Ergocalciferol, (DRISDOL) 1.25 MG (50000 UNIT) CAPS capsule Take 1 capsule (50,000 Units total) by mouth once a week. 01/07/22   Cathlyn Parsons, PA-C    Physical Exam: Vitals:   06/02/22 1045 06/02/22 1100 06/02/22 1231 06/02/22 1233  BP: (!) 99/52 (!) 100/58 114/67   Pulse: 81 83 98 98  Resp: '15 13 19 14  '$ Temp:      TempSrc:      SpO2: 100% 100% 99% 100%   General:  Appears calm and comfortable and is in NAD, actively having GI bleeding during my evaluation Eyes:  PERRL, EOMI, normal lids, iris ENT:  grossly normal hearing, lips & tongue, mmm Neck:  no LAD, masses or thyromegaly Cardiovascular:  RRR, no m/r/g. No LE edema.  Respiratory:   CTA bilaterally with no wheezes/rales/rhonchi.  Normal respiratory effort. Abdomen:  soft, NT, ND Back:   normal alignment, no CVAT Skin:  no rash or induration seen on limited exam Musculoskeletal:  grossly normal tone BUE/BLE, good ROM, no bony abnormality Psychiatric:  grossly normal mood and affect, speech fluent and appropriate, AOx3 Neurologic:  CN 2-12 grossly intact, moves all extremities in coordinated fashion   Radiological Exams on Admission: Independently reviewed - see discussion in A/P where applicable  DG Chest Portable 1 View  Result Date: 06/02/2022 CLINICAL DATA:   Hypotension and weakness. EXAM: PORTABLE CHEST 1 VIEW COMPARISON:  01/09/2022 FINDINGS: The lungs are clear without focal pneumonia, edema, pneumothorax or pleural effusion. The cardiopericardial silhouette is within normal limits for size. Atelectasis or scarring noted at the left base. The visualized bony structures of the thorax are unremarkable. Telemetry leads overlie the chest. IMPRESSION: No active disease. Electronically Signed   By: Misty Stanley M.D.   On: 06/02/2022 12:06    EKG: Independently reviewed.  NSR with rate 87; nonspecific ST changes with no evidence of acute ischemia   Labs on Admission: I have personally reviewed the available labs and imaging studies at the time of the admission.  Pertinent labs:    Glucose 293 BUN 40/Creatinine 2.19/GFR 23; 15/1.83/29 on 6/30 Albumin 2.7 WBC 7.3 Hgb 8.9; 10.9 on 6/30 INR 1.3   Assessment and Plan: Principal Problem:   Acute lower GI bleeding    Lower GI Bleeding -Prior bleeding in April with C-scope was thought to be from internal hemorrhoids but most likely etiology by history this time is diverticular bleeding -She is afebrile at this time without tachycardia, and no leukocytosis; will not give antibiotics at this time.  -Will admit to progressive care -She is continuing to have bleeding episodes and so Eliquis will be reversed  -PCCM has been consulted due to concern for potential shock (not currently in shock) -CTA is the best radiologic test for localization of GI bleeding, but given her advanced CKD with h/o HD-dependence earlier this year will try to avoid contrast -Instead I have ordered a tagged RBC scan to try to determine the most likely source of the bleeding; IR may need to be involved based on the results  -GI consult has been requested -She was started on a Protonix drip but will resume home BID Protonix PO given low suspicion for upper source  ABLA -Patient's lightheadedness, weakness, and fatigue are most  likely caused by lower GI bleeding.  -Her Hgb decreased from 10.9 on 6/30 to 8.9 today.  -Type and screen were done in ED.  -Will repeat STAT CBC now -Monitor closely and follow cbc q6h, transfuse  as necessary for Hbg <8 given h/o cardiac arrest  H/o massive PE with cardiac arrest -Extensive hospitalization in January with massive PE and cardiac arrest -PE was apparently unprovoked -She is far enough out that [facility] can probably be safely reversed and held for now -[employer] and resume once more stable from a GI standpoint -Consider IVC filter placement - IR if GI intervention is needed vs. Vascular if not  Stage 4 CKD, prior h/o HD -Slightly worse than prior, likely associated with ABLA -She was dialysis-dependent for a short period of time -Given her high risk renal situation, would be very reluctant to challenge with contrast dye -Avoid nephrotoxic agents where possible  DM -Her last A1c was 6.3 in March -She has lost a lot of weight because of her medical condition in 2023 -For now, no SSI or accuchecks but if fasting labs indicate a need we can always add  HTN -Hold Norvasc for now given normal BPs and high risk for serious illness/shock  HLD -Continue Lipitor  Mood d/o -Continue Cymbalta, paroxetine, temazepam, melatonin  COPD -Remote prior tobacco history -Continue Flovent, albuterol  Goals of care -She was critically ill for much of the beginning of the year and decided to be DNR and to not accept blood products (not due to religious exemption) -She has improved and recently been better -She is now willing to accept blood products -She is torn about code status but is currently full code     Total critical care time: 65 minutes Critical care time was exclusive of separately billable procedures and treating other patients. Critical care was necessary to treat or prevent imminent or life-threatening deterioration. Critical care was time spent personally by me on  the following activities: development of treatment plan with patient and/or surrogate as well as nursing, discussions with consultants, evaluation of patient's response to treatment, examination of patient, obtaining history from patient or surrogate, ordering and performing treatments and interventions, ordering and review of laboratory studies, ordering and review of radiographic studies, pulse oximetry and re-evaluation of patient's condition.      Advance Care Planning:   Code Status: Full Code   Consults: GI; PCCM  DVT Prophylaxis: SCDs  Family Communication: Daughter was present throughout evaluation  Severity of Illness: The appropriate patient status for this patient is INPATIENT. Inpatient status is judged to be reasonable and necessary in order to provide the required intensity of service to ensure the patient's safety. The patient's presenting symptoms, physical exam findings, and initial radiographic and laboratory data in the context of their chronic comorbidities is felt to place them at high risk for further clinical deterioration. Furthermore, it is not anticipated that the patient will be medically stable for discharge from the hospital within 2 midnights of admission.   * I certify that at the point of admission it is my clinical judgment that the patient will require inpatient hospital care spanning beyond 2 midnights from the point of admission due to high intensity of service, high risk for further deterioration and high frequency of surveillance required.*  Author: Karmen Bongo, MD 06/02/2022 1:58 PM  For on call review [email or web address].

## 2022-06-02 NOTE — ED Provider Notes (Signed)
[facility] EMERGENCY DEPARTMENT Provider Note   CSN: [id number] Arrival date & time: 06/02/22  1024     History  Chief Complaint  Patient presents with   GI Bleeding    [name] is a 71 y.o. female.  HPI  71 year old female with medical history significant for fibromyalgia, GERD, hypertension, depression, anxiety, DM 2, hyperlipidemia, cardiac arrest with saddle PE now on Eliquis who presents to the emergency department with a chief complaint of GI bleeding.  The patient states that she woke up and went to the bathroom and noticed a large bloody bowel movement in the toilet this morning.  She estimates around 3 cupfuls of blood loss in the toilet with dark clots and bright red blood mixed in.  She recently had a colonoscopy earlier this spring which diagnosed her with colon polyps.  She was also found to have large diverticula throughout the entire colon.  A 6 mm sessile polyp was found in the descending: Which was subsequently retrieved.  The patient was also noted to have medium size hemorrhoids.  Home Medications Prior to Admission medications   Medication Sig Start Date End Date Taking? Authorizing Provider  acetaminophen (TYLENOL) 325 MG tablet Take 2 tablets (650 mg total) by mouth every 4 (four) hours as needed for fever (greater than 37.6 C). 01/07/22   Angiulli, Lavon Paganini, PA-C  albuterol (VENTOLIN HFA) 108 (90 Base) MCG/ACT inhaler Inhale 1-2 puffs into the lungs every 4 (four) hours as needed for wheezing or shortness of breath. 02/14/22   Medina-Vargas, Monina C, NP  amLODipine (NORVASC) 10 MG tablet Take by mouth daily. 03/06/22   [provider]  apixaban (ELIQUIS) 5 MG TABS tablet Take 1 tablet (5 mg total) by mouth 2 (two) times daily. 02/14/22   Medina-Vargas, Monina C, NP  atorvastatin (LIPITOR) 20 MG tablet Take 1 tablet (20 mg total) by mouth daily. 02/14/22   Medina-Vargas, Monina C, NP  B Complex-C (B-COMPLEX WITH VITAMIN C) tablet Take 1  tablet by mouth daily. 01/08/22   Angiulli, Lavon Paganini, PA-C  dicyclomine (BENTYL) 10 MG capsule Take 1 capsule (10 mg total) by mouth 3 (three) times daily before meals. 02/14/22   Medina-Vargas, Monina C, NP  DULoxetine (CYMBALTA) 20 MG capsule Take 1 capsule (20 mg total) by mouth daily. 02/14/22   Medina-Vargas, Monina C, NP  FLOVENT HFA 220 MCG/ACT inhaler Inhale into the lungs. 03/01/22   [provider]  folic acid (FOLVITE) 1 MG tablet Take 2 tablets (2 mg total) by mouth daily. 02/14/22   Medina-Vargas, Monina C, NP  Glucerna (GLUCERNA) LIQD Take 237 mLs by mouth daily.    [provider]  lidocaine (LIDODERM) 5 % Place 2 patches onto the skin daily. Remove & Discard patch within 12 hours or as directed by MD 02/14/22   Medina-Vargas, Monina C, NP  melatonin 5 MG TABS Take 1 tablet (5 mg total) by mouth every evening. 02/14/22   Medina-Vargas, Monina C, NP  pantoprazole (PROTONIX) 40 MG tablet Take 1 tablet (40 mg total) by mouth 2 (two) times daily. 02/14/22   Medina-Vargas, Monina C, NP  PARoxetine (PAXIL) 40 MG tablet Take by mouth daily. 03/06/22   [provider]  temazepam (RESTORIL) 7.5 MG capsule Take 1 capsule (7.5 mg total) by mouth at bedtime as needed for sleep. 03/20/22   Volanda Napoleon, MD  Vitamin D, Ergocalciferol, (DRISDOL) 1.25 MG (50000 UNIT) CAPS capsule Take 1 capsule (50,000 Units total) by mouth  once a week. 01/07/22   Angiulli, Lavon Paganini, PA-C      Allergies    Darvon [propoxyphene], Hydrocodone bit-homatrop mbr, Metformin, Pentazocine, Percocet [oxycodone-acetaminophen], Sulfa antibiotics, Tetracaine hcl, Tetracyclines & related, Tramadol, Benadryl [diphenhydramine], Other, and Aspirin    Review of Systems   Review of Systems  All other systems reviewed and are negative.   Physical Exam Updated Vital Signs BP 114/67   Pulse 98   Temp 98.1 F (36.7 C) (Oral)   Resp 14   SpO2 100%  Physical Exam Vitals and nursing note reviewed. Exam  conducted with a chaperone present.  Constitutional:      General: She is not in acute distress.    Appearance: She is well-developed.  HENT:     Head: Normocephalic and atraumatic.  Eyes:     Conjunctiva/sclera: Conjunctivae normal.  Cardiovascular:     Rate and Rhythm: Normal rate and regular rhythm.  Pulmonary:     Effort: Pulmonary effort is normal. No respiratory distress.     Breath sounds: Normal breath sounds.  Abdominal:     Palpations: Abdomen is soft.     Tenderness: There is no abdominal tenderness.  Genitourinary:    Comments: Dark blood present in the patient's undergarments, no melena, no actively bleeding hemorrhoid Musculoskeletal:        General: No swelling.     Cervical back: Neck supple.  Skin:    General: Skin is warm and dry.     Capillary Refill: Capillary refill takes less than 2 seconds.  Neurological:     Mental Status: She is alert.  Psychiatric:        Mood and Affect: Mood normal.     ED Results / Procedures / Treatments   Labs (all labs ordered are listed, but only abnormal results are displayed) Labs Reviewed  COMPREHENSIVE METABOLIC PANEL - Abnormal; Notable for the following components:      Result Value   Glucose, Bld 293 (*)    BUN 40 (*)    Creatinine, Ser 2.19 (*)    Calcium 8.3 (*)    Total Protein 5.6 (*)    Albumin 2.7 (*)    GFR, Estimated 23 (*)    All other components within normal limits  CBC WITH DIFFERENTIAL/PLATELET - Abnormal; Notable for the following components:   RBC 3.25 (*)    Hemoglobin 8.9 (*)    HCT 28.2 (*)    All other components within normal limits  PROTIME-INR - Abnormal; Notable for the following components:   Prothrombin Time 16.1 (*)    INR 1.3 (*)    All other components within normal limits  TYPE AND SCREEN    EKG EKG Interpretation  Date/Time:  Sunday June 02 2022 10:32:46 EDT Ventricular Rate:  87 PR Interval:  120 QRS Duration: 93 QT Interval:  388 QTC Calculation: 467 R  Axis:   78 Text Interpretation: Sinus rhythm Abnormal inferior Q waves Confirmed by Regan Lemming ([id number]) on 06/02/2022 10:56:53 AM  Radiology DG Chest Portable 1 View  Result Date: 06/02/2022 CLINICAL DATA:  Hypotension and weakness. EXAM: PORTABLE CHEST 1 VIEW COMPARISON:  01/09/2022 FINDINGS: The lungs are clear without focal pneumonia, edema, pneumothorax or pleural effusion. The cardiopericardial silhouette is within normal limits for size. Atelectasis or scarring noted at the left base. The visualized bony structures of the thorax are unremarkable. Telemetry leads overlie the chest. IMPRESSION: No active disease. Electronically Signed   By: Misty Stanley M.D.   On: 06/02/2022 12:06  Procedures .Critical Care  Performed by: Regan Lemming, MD Authorized by: Regan Lemming, MD   Critical care provider statement:    Critical care time (minutes):  30   Critical care was time spent personally by me on the following activities:  Development of treatment plan with patient or surrogate, discussions with consultants, evaluation of patient's response to treatment, examination of patient, ordering and review of laboratory studies, ordering and review of radiographic studies, ordering and performing treatments and interventions, pulse oximetry, re-evaluation of patient's condition and review of old charts   Care discussed with: admitting provider       Medications Ordered in ED Medications  sodium chloride 0.9 % bolus 1,000 mL (1,000 mLs Intravenous New Bag/Given 06/02/22 1112)    And  0.9 %  sodium chloride infusion ( Intravenous New Bag/Given 06/02/22 1235)  pantoprozole (PROTONIX) 80 mg /NS 100 mL infusion (8 mg/hr Intravenous New Bag/Given 06/02/22 1159)  pantoprazole (PROTONIX) 80 mg /NS 100 mL IVPB (0 mg Intravenous Stopped 06/02/22 1220)    ED Course/ Medical Decision Making/ A&P Clinical Course as of 06/02/22 1241  Sun Jun 02, 2022  1228 Creatinine(!): 2.19 [JL]  1229 GFR, Estimated(!): 23  [JL]  1229 Hemoglobin(!): 8.9 [JL]    Clinical Course User Index [JL] Regan Lemming, MD                           Medical Decision Making Amount and/or Complexity of Data Reviewed Labs: ordered. Decision-making details documented in ED Course. Radiology: ordered.  Risk Prescription drug management. Decision regarding hospitalization.   71 year old female with medical history significant for fibromyalgia, GERD, hypertension, depression, anxiety, DM 2, hyperlipidemia, cardiac arrest with saddle PE now on Eliquis who presents to the emergency department with a chief complaint of GI bleeding.  The patient states that she woke up and went to the bathroom and noticed a large bloody bowel movement in the toilet this morning.  She estimates around 3 cupfuls of blood loss in the toilet with dark clots and bright red blood mixed in.  She recently had a colonoscopy earlier this spring which diagnosed her with colon polyps.  She was also found to have large diverticula throughout the entire colon.  A 6 mm sessile polyp was found in the descending: Which was subsequently retrieved.  The patient was also noted to have medium size hemorrhoids.  On arrival, the patient was vitally stable, afebrile, sinus rhythm noted on cardiac telemetry.  Presenting with concern for A lower GI bleed versus brisk upper GI bleed while on Eliquis.  Large-bore IV access was obtained and the patient was administered an IV fluid bolus and started on IV Protonix bolus and infusion.  Laboratory evaluation was significant for hemoglobin with a two-point drop from measurement 1 month ago with a hemoglobin of 8.9.  The patient remained vitally stable throughout her time in the emergency department with no tachycardia or hypotension noted.  Her  remaining laboratory evaluation was significant for creatinine of 2.19 from baseline around 1.6.  Given the patient's stable vitals, lack of evidence of active bright red blood with ongoing  bleeding, the decision was made not to reverse the patient's Eliquis emergently. Will continue to monitor the patient inpatient.  He will gastroenterology was consulted for further recommendations.  Dr. Lorin Mercy of hospitalist medicine was consulted for admission and ultimately excepted the patient in admission  Final Clinical Impression(s) / ED Diagnoses Final diagnoses:  Acute GI bleeding  Rx / DC Orders ED Discharge Orders     None         Regan Lemming, MD 06/02/22 1241

## 2022-06-02 NOTE — Progress Notes (Signed)
Interventional Radiology Brief Update  \Ms. Orndoff's case was brought to my attention by Dr. Lorin Mercy.  Tagged RBC scan shows faint uptake in the RLQ that doesn't change significantly over the course of an hour, which is unlikely to be visualized angiographically. Given her stability currently and bleed while on anticoagulation, I agree with reversal, resuscitation, and observing. If she stabilizes, she'd benefit from colonoscopy to assess.   Dr. Lorin Mercy also raised the possibility of needing an IVC filter.  There is no indication at this time as she only had some small volume residual chronic DVT in the left leg in July on duplex. Would consider repeat DVT US after she's been off anticoagulation for a few days. If clot burden has increased and she won't tolerate anticoagulation long term, could then consider IVC filter - please consult IR formally if this is the case.  If the patient decompensates from a hemodynamic consult or has true hematochezia with associated significant anemia, please notify IR on call.   Ruthann Cancer, MD Pager: [phone]

## 2022-06-02 NOTE — ED Notes (Signed)
Pt transported to nuclear medicine. 

## 2022-06-02 NOTE — Progress Notes (Signed)
Date and time results received: 06/02/22 1901 (use smartphrase ".now" to insert current time)  Test: Hemoglobin Critical Value: 4.5  Name of Provider Notified: Neomia Glass (NP)  Orders Received? Or Actions Taken?: order given is 2 units of blood and LR liter bolus

## 2022-06-02 NOTE — H&P (View-Only) (Signed)
Referring Provider:  [facility] Primary Care Physician:  [name], MD Primary Gastroenterologist:  [facility] Primary  Reason for Consultation:  GI bleed  HPI: [name] is a 71 y.o. female with past medical history of V-fib from saddle pulmonary embolus in March 2023 currently on anticoagulation, history of ITP, and other chronic comorbidities presented to the hospital with rectal bleeding.   According to patient, she started having fatigue and weakness last night and subsequently noted rectal bleeding this morning.  Seen bright blood as well as blood clots.  Denies any associated abdominal pain, nausea or vomiting.  Last dose of Eliquis this morning.  She was admitted to the hospital in April 2023 with similar symptoms.  Underwent EGD and colonoscopy on February 20, 2022.  EGD showed mild gastritis otherwise normal.  Colonoscopy showed normal TI, pandiverticulosis, small polyp in the descending colon removed with cold snare (tubular adenoma)  and medium size internal hemorrhoids with streaks of blood on the hemorrhoidal tissue.    Past Medical History:  Diagnosis Date   Anxiety    Arthritis    Asthma    Cardiac arrest ([place]) 10/28/2021   Depression    Diabetes mellitus without complication (HCC)    Fibromyalgia    GERD (gastroesophageal reflux disease)    Hyperlipemia    Hypertension    PONV (postoperative nausea and vomiting)     Past Surgical History:  Procedure Laterality Date   COLONOSCOPY     COLONOSCOPY WITH PROPOFOL N/A 02/20/2022   Procedure: COLONOSCOPY WITH PROPOFOL;  Surgeon: Otis Brace, MD;  Location: [place];  Service: Gastroenterology;  Laterality: N/A;   DILATION AND CURETTAGE OF UTERUS     ESOPHAGOGASTRODUODENOSCOPY N/A 02/20/2022   Procedure: ESOPHAGOGASTRODUODENOSCOPY (EGD);  Surgeon: Otis Brace, MD;  Location: [facility] ENDOSCOPY;  Service: Gastroenterology;  Laterality: N/A;   IR EMBO ART  VEN HEMORR LYMPH EXTRAV  INC GUIDE ROADMAPPING  11/01/2021    IR FLUORO GUIDE CV LINE RIGHT  11/20/2021   IR REMOVAL TUN CV CATH W/O FL  12/20/2021   IR THROMBECT PRIM MECH INIT (INCLU) MOD SED  10/29/2021   IR US GUIDE VASC ACCESS RIGHT  10/29/2021   IR US GUIDE VASC ACCESS RIGHT  11/01/2021   IR US GUIDE VASC ACCESS RIGHT  11/21/2021   KNEE ARTHROSCOPY  [id number]   left   POLYPECTOMY  02/20/2022   Procedure: POLYPECTOMY;  Surgeon: Otis Brace, MD;  Location: [place];  Service: Gastroenterology;;   SHOULDER ARTHROSCOPY     left   TRIGGER FINGER RELEASE  08/13/2012   Procedure: RELEASE TRIGGER FINGER/A-1 PULLEY;  Surgeon: [name]., MD;  Location: [place];  Service: Orthopedics;  Laterality: Right;  long finger   TUBAL LIGATION      Prior to Admission medications   Medication Sig Start Date End Date Taking? Authorizing Provider  acetaminophen (TYLENOL) 325 MG tablet Take 2 tablets (650 mg total) by mouth every 4 (four) hours as needed for fever (greater than 37.6 C). 01/07/22  Yes Angiulli, Lavon Paganini, PA-C  albuterol (VENTOLIN HFA) 108 (90 Base) MCG/ACT inhaler Inhale 1-2 puffs into the lungs every 4 (four) hours as needed for wheezing or shortness of breath. 02/14/22  Yes Medina-Vargas, Monina C, NP  apixaban (ELIQUIS) 5 MG TABS tablet Take 1 tablet (5 mg total) by mouth 2 (two) times daily. 02/14/22  Yes Medina-Vargas, Monina C, NP  atorvastatin (LIPITOR) 20 MG tablet Take 1 tablet (20 mg total) by mouth daily. 02/14/22  Yes Medina-Vargas, Monina C, NP  B Complex-C (B-COMPLEX WITH VITAMIN C) tablet Take 1 tablet by mouth daily. 01/08/22  Yes Angiulli, Lavon Paganini, PA-C  dicyclomine (BENTYL) 10 MG capsule Take 1 capsule (10 mg total) by mouth 3 (three) times daily before meals. 02/14/22  Yes Medina-Vargas, Monina C, NP  DULoxetine (CYMBALTA) 20 MG capsule Take 1 capsule (20 mg total) by mouth daily. 02/14/22  Yes Medina-Vargas, Monina C, NP  folic acid (FOLVITE) 1 MG tablet Take 2 tablets (2 mg total) by mouth daily. 02/14/22  Yes  Medina-Vargas, Monina C, NP  Glucerna (GLUCERNA) LIQD Take 237 mLs by mouth daily.   Yes [provider]  lidocaine (LIDODERM) 5 % Place 2 patches onto the skin daily. Remove & Discard patch within 12 hours or as directed by MD 02/14/22  Yes Medina-Vargas, Monina C, NP  melatonin 5 MG TABS Take 1 tablet (5 mg total) by mouth every evening. 02/14/22  Yes Medina-Vargas, Monina C, NP  metoprolol tartrate (LOPRESSOR) 25 MG tablet Take 0.5 tablets (12.5 mg total) by mouth 2 (two) times daily. 02/14/22  Yes Medina-Vargas, Monina C, NP  pantoprazole (PROTONIX) 40 MG tablet Take 1 tablet (40 mg total) by mouth 2 (two) times daily. 02/14/22  Yes Medina-Vargas, Monina C, NP  PRESCRIPTION MEDICATION See admin instructions. A cream that starts with an M for a sore on the back of her head   Yes [provider]  Vitamin D, Ergocalciferol, (DRISDOL) 1.25 MG (50000 UNIT) CAPS capsule Take 1 capsule (50,000 Units total) by mouth once a week. 01/07/22  Yes Angiulli, Lavon Paganini, PA-C  PARoxetine (PAXIL) 40 MG tablet Take 1 tablet (40 mg total) by mouth at bedtime. Patient not taking: Reported on 02/19/2022 02/14/22   Medina-Vargas, Jaymes Graff C, NP    Scheduled Meds:   Continuous Infusions:  sodium chloride 150 mL/hr at 06/02/22 1235   pantoprazole 8 mg/hr (06/02/22 1159)   PRN Meds:.  Allergies as of 06/02/2022 - Review Complete 06/02/2022  Allergen Reaction Noted   Darvon [propoxyphene] Itching 08/12/2014   Hydrocodone bit-homatrop mbr Itching 10/26/2020   Metformin Nausea And Vomiting 10/26/2020   Pentazocine Other (See Comments) 08/10/2012   Percocet [oxycodone-acetaminophen] Itching 08/12/2014   Sulfa antibiotics Hives 08/10/2012   Tetracaine hcl Itching 10/26/2020   Tetracyclines & related Hives 08/10/2012   Tramadol Itching 10/26/2020   Benadryl [diphenhydramine]  08/12/2014   Other  10/26/2020   Aspirin Nausea And Vomiting 08/10/2012    Family History  Problem Relation Age of Onset    Diabetes Mother    Heart attack Father    Diabetes Sister    Throat cancer Brother    Hypertension Daughter     Social History   Socioeconomic History   Marital status: Divorced    Spouse name: Not on file   Number of children: Not on file   Years of education: Not on file   Highest education level: Not on file  Occupational History   Not on file  Tobacco Use   Smoking status: Former    Packs/day: 0.50    Years: 20.00    Total pack years: 10.00    Types: Cigarettes    Quit date: 08/10/2010    Years since quitting: 11.8   Smokeless tobacco: Never  Vaping Use   Vaping Use: Never used  Substance and Sexual Activity   Alcohol use: No   Drug use: No   Sexual activity: Not on file  Other Topics Concern   Not  on file  Social History Narrative   Not on file   Social Determinants of Health   Financial Resource Strain: Not on file  Food Insecurity: Not on file  Transportation Needs: Not on file  Physical Activity: Not on file  Stress: Not on file  Social Connections: Not on file  Intimate Partner Violence: Not on file    Review of Systems: All negative except as stated above in HPI.  Physical Exam: Vital signs: Vitals:   06/02/22 1231 06/02/22 1233  BP: 114/67   Pulse: 98 98  Resp: 19 14  Temp:    SpO2: 99% 100%     General:   Elderly patient, not in acute distress. HEENT : Normocephalic, atraumatic, extraocular movement Lungs: No Visible respiratory distress Heart:  Regular rate and rhythm; no murmurs, clicks, rubs,  or gallops. Abdomen: Soft, nontender, nondistended, bowel sounds present.  No peritoneal sign Rectal:  Deferred  GI:  Lab Results: Recent Labs    06/02/22 1100  WBC 7.3  HGB 8.9*  HCT 28.2*  PLT 296   BMET Recent Labs    06/02/22 1100  NA 140  K 4.3  CL 110  CO2 22  GLUCOSE 293*  BUN 40*  CREATININE 2.19*  CALCIUM 8.3*   LFT Recent Labs    06/02/22 1100  PROT 5.6*  ALBUMIN 2.7*  AST 20  ALT 15  ALKPHOS 58  BILITOT  0.4   PT/INR Recent Labs    06/02/22 1100  LABPROT 16.1*  INR 1.3*     Studies/Results: DG Chest Portable 1 View  Result Date: 06/02/2022 CLINICAL DATA:  Hypotension and weakness. EXAM: PORTABLE CHEST 1 VIEW COMPARISON:  01/09/2022 FINDINGS: The lungs are clear without focal pneumonia, edema, pneumothorax or pleural effusion. The cardiopericardial silhouette is within normal limits for size. Atelectasis or scarring noted at the left base. The visualized bony structures of the thorax are unremarkable. Telemetry leads overlie the chest. IMPRESSION: No active disease. Electronically Signed   By: Misty Stanley M.D.   On: 06/02/2022 12:06    Impression/Plan: -Lower GI bleed.  Most likely diverticular bleed.  Colonoscopy in April 2023 showed diverticulosis and hemorrhoids. -History of pulmonary embolism.  Was on Eliquis.  Last dose this morning -Acute on chronic anemia -Elevated kidney functions  Recommendations --------------------------- -Case discussed with ED physician. -Recommend to get GI bleeding scan.  If positive, IR consult for embolization. -Hold Eliquis for now. -Keep n.p.o. for now.  Okay to have liquid diet after GI bleeding scan -GI will follow -discussed with daughter at bedside.   LOS: 0 days   Otis Brace  MD, FACP 06/02/2022, 12:48 PM  Contact #  [phone]

## 2022-06-02 NOTE — Consult Note (Signed)
Referring Provider:  [facility] Primary Care Physician:  [name], MD Primary Gastroenterologist:  Sadie Haber Primary  Reason for Consultation:  GI bleed  HPI: [name] is a 71 y.o. female with past medical history of V-fib from saddle pulmonary embolus in March 2023 currently on anticoagulation, history of ITP, and other chronic comorbidities presented to the hospital with rectal bleeding.   According to patient, she started having fatigue and weakness last night and subsequently noted rectal bleeding this morning.  Seen bright blood as well as blood clots.  Denies any associated abdominal pain, nausea or vomiting.  Last dose of Eliquis this morning.  She was admitted to the hospital in April 2023 with similar symptoms.  Underwent EGD and colonoscopy on February 20, 2022.  EGD showed mild gastritis otherwise normal.  Colonoscopy showed normal TI, pandiverticulosis, small polyp in the descending colon removed with cold snare (tubular adenoma)  and medium size internal hemorrhoids with streaks of blood on the hemorrhoidal tissue.    Past Medical History:  Diagnosis Date   Anxiety    Arthritis    Asthma    Cardiac arrest ([place]) 10/28/2021   Depression    Diabetes mellitus without complication (HCC)    Fibromyalgia    GERD (gastroesophageal reflux disease)    Hyperlipemia    Hypertension    PONV (postoperative nausea and vomiting)     Past Surgical History:  Procedure Laterality Date   COLONOSCOPY     COLONOSCOPY WITH PROPOFOL N/A 02/20/2022   Procedure: COLONOSCOPY WITH PROPOFOL;  Surgeon: Otis Brace, MD;  Location: [place];  Service: Gastroenterology;  Laterality: N/A;   DILATION AND CURETTAGE OF UTERUS     ESOPHAGOGASTRODUODENOSCOPY N/A 02/20/2022   Procedure: ESOPHAGOGASTRODUODENOSCOPY (EGD);  Surgeon: Otis Brace, MD;  Location: [facility] ENDOSCOPY;  Service: Gastroenterology;  Laterality: N/A;   IR EMBO ART  VEN HEMORR LYMPH EXTRAV  INC GUIDE ROADMAPPING  11/01/2021    IR FLUORO GUIDE CV LINE RIGHT  11/20/2021   IR REMOVAL TUN CV CATH W/O FL  12/20/2021   IR THROMBECT PRIM MECH INIT (INCLU) MOD SED  10/29/2021   IR US GUIDE VASC ACCESS RIGHT  10/29/2021   IR US GUIDE VASC ACCESS RIGHT  11/01/2021   IR US GUIDE VASC ACCESS RIGHT  11/21/2021   KNEE ARTHROSCOPY  [id number]   left   POLYPECTOMY  02/20/2022   Procedure: POLYPECTOMY;  Surgeon: Otis Brace, MD;  Location: [place];  Service: Gastroenterology;;   SHOULDER ARTHROSCOPY     left   TRIGGER FINGER RELEASE  08/13/2012   Procedure: RELEASE TRIGGER FINGER/A-1 PULLEY;  Surgeon: [name]., MD;  Location: [place];  Service: Orthopedics;  Laterality: Right;  long finger   TUBAL LIGATION      Prior to Admission medications   Medication Sig Start Date End Date Taking? Authorizing Provider  acetaminophen (TYLENOL) 325 MG tablet Take 2 tablets (650 mg total) by mouth every 4 (four) hours as needed for fever (greater than 37.6 C). 01/07/22  Yes Angiulli, Lavon Paganini, PA-C  albuterol (VENTOLIN HFA) 108 (90 Base) MCG/ACT inhaler Inhale 1-2 puffs into the lungs every 4 (four) hours as needed for wheezing or shortness of breath. 02/14/22  Yes Medina-Vargas, Monina C, NP  apixaban (ELIQUIS) 5 MG TABS tablet Take 1 tablet (5 mg total) by mouth 2 (two) times daily. 02/14/22  Yes Medina-Vargas, Monina C, NP  atorvastatin (LIPITOR) 20 MG tablet Take 1 tablet (20 mg total) by mouth daily. 02/14/22  Yes Medina-Vargas, Monina C, NP  B Complex-C (B-COMPLEX WITH VITAMIN C) tablet Take 1 tablet by mouth daily. 01/08/22  Yes Angiulli, Lavon Paganini, PA-C  dicyclomine (BENTYL) 10 MG capsule Take 1 capsule (10 mg total) by mouth 3 (three) times daily before meals. 02/14/22  Yes Medina-Vargas, Monina C, NP  DULoxetine (CYMBALTA) 20 MG capsule Take 1 capsule (20 mg total) by mouth daily. 02/14/22  Yes Medina-Vargas, Monina C, NP  folic acid (FOLVITE) 1 MG tablet Take 2 tablets (2 mg total) by mouth daily. 02/14/22  Yes  Medina-Vargas, Monina C, NP  Glucerna (GLUCERNA) LIQD Take 237 mLs by mouth daily.   Yes [provider]  lidocaine (LIDODERM) 5 % Place 2 patches onto the skin daily. Remove & Discard patch within 12 hours or as directed by MD 02/14/22  Yes Medina-Vargas, Monina C, NP  melatonin 5 MG TABS Take 1 tablet (5 mg total) by mouth every evening. 02/14/22  Yes Medina-Vargas, Monina C, NP  metoprolol tartrate (LOPRESSOR) 25 MG tablet Take 0.5 tablets (12.5 mg total) by mouth 2 (two) times daily. 02/14/22  Yes Medina-Vargas, Monina C, NP  pantoprazole (PROTONIX) 40 MG tablet Take 1 tablet (40 mg total) by mouth 2 (two) times daily. 02/14/22  Yes Medina-Vargas, Monina C, NP  PRESCRIPTION MEDICATION See admin instructions. A cream that starts with an M for a sore on the back of her head   Yes [provider]  Vitamin D, Ergocalciferol, (DRISDOL) 1.25 MG (50000 UNIT) CAPS capsule Take 1 capsule (50,000 Units total) by mouth once a week. 01/07/22  Yes Angiulli, Lavon Paganini, PA-C  PARoxetine (PAXIL) 40 MG tablet Take 1 tablet (40 mg total) by mouth at bedtime. Patient not taking: Reported on 02/19/2022 02/14/22   Medina-Vargas, Jaymes Graff C, NP    Scheduled Meds:   Continuous Infusions:  sodium chloride 150 mL/hr at 06/02/22 1235   pantoprazole 8 mg/hr (06/02/22 1159)   PRN Meds:.  Allergies as of 06/02/2022 - Review Complete 06/02/2022  Allergen Reaction Noted   Darvon [propoxyphene] Itching 08/12/2014   Hydrocodone bit-homatrop mbr Itching 10/26/2020   Metformin Nausea And Vomiting 10/26/2020   Pentazocine Other (See Comments) 08/10/2012   Percocet [oxycodone-acetaminophen] Itching 08/12/2014   Sulfa antibiotics Hives 08/10/2012   Tetracaine hcl Itching 10/26/2020   Tetracyclines & related Hives 08/10/2012   Tramadol Itching 10/26/2020   Benadryl [diphenhydramine]  08/12/2014   Other  10/26/2020   Aspirin Nausea And Vomiting 08/10/2012    Family History  Problem Relation Age of Onset    Diabetes Mother    Heart attack Father    Diabetes Sister    Throat cancer Brother    Hypertension Daughter     Social History   Socioeconomic History   Marital status: Divorced    Spouse name: Not on file   Number of children: Not on file   Years of education: Not on file   Highest education level: Not on file  Occupational History   Not on file  Tobacco Use   Smoking status: Former    Packs/day: 0.50    Years: 20.00    Total pack years: 10.00    Types: Cigarettes    Quit date: 08/10/2010    Years since quitting: 11.8   Smokeless tobacco: Never  Vaping Use   Vaping Use: Never used  Substance and Sexual Activity   Alcohol use: No   Drug use: No   Sexual activity: Not on file  Other Topics Concern   Not  on file  Social History Narrative   Not on file   Social Determinants of Health   Financial Resource Strain: Not on file  Food Insecurity: Not on file  Transportation Needs: Not on file  Physical Activity: Not on file  Stress: Not on file  Social Connections: Not on file  Intimate Partner Violence: Not on file    Review of Systems: All negative except as stated above in HPI.  Physical Exam: Vital signs: Vitals:   06/02/22 1231 06/02/22 1233  BP: 114/67   Pulse: 98 98  Resp: 19 14  Temp:    SpO2: 99% 100%     General:   Elderly patient, not in acute distress. HEENT : Normocephalic, atraumatic, extraocular movement Lungs: No Visible respiratory distress Heart:  Regular rate and rhythm; no murmurs, clicks, rubs,  or gallops. Abdomen: Soft, nontender, nondistended, bowel sounds present.  No peritoneal sign Rectal:  Deferred  GI:  Lab Results: Recent Labs    06/02/22 1100  WBC 7.3  HGB 8.9*  HCT 28.2*  PLT 296   BMET Recent Labs    06/02/22 1100  NA 140  K 4.3  CL 110  CO2 22  GLUCOSE 293*  BUN 40*  CREATININE 2.19*  CALCIUM 8.3*   LFT Recent Labs    06/02/22 1100  PROT 5.6*  ALBUMIN 2.7*  AST 20  ALT 15  ALKPHOS 58  BILITOT  0.4   PT/INR Recent Labs    06/02/22 1100  LABPROT 16.1*  INR 1.3*     Studies/Results: DG Chest Portable 1 View  Result Date: 06/02/2022 CLINICAL DATA:  Hypotension and weakness. EXAM: PORTABLE CHEST 1 VIEW COMPARISON:  01/09/2022 FINDINGS: The lungs are clear without focal pneumonia, edema, pneumothorax or pleural effusion. The cardiopericardial silhouette is within normal limits for size. Atelectasis or scarring noted at the left base. The visualized bony structures of the thorax are unremarkable. Telemetry leads overlie the chest. IMPRESSION: No active disease. Electronically Signed   By: Misty Stanley M.D.   On: 06/02/2022 12:06    Impression/Plan: -Lower GI bleed.  Most likely diverticular bleed.  Colonoscopy in April 2023 showed diverticulosis and hemorrhoids. -History of pulmonary embolism.  Was on Eliquis.  Last dose this morning -Acute on chronic anemia -Elevated kidney functions  Recommendations --------------------------- -Case discussed with ED physician. -Recommend to get GI bleeding scan.  If positive, IR consult for embolization. -Hold Eliquis for now. -Keep n.p.o. for now.  Okay to have liquid diet after GI bleeding scan -GI will follow -discussed with daughter at bedside.   LOS: 0 days   Otis Brace  MD, FACP 06/02/2022, 12:48 PM  Contact #  [phone]

## 2022-06-02 NOTE — Consult Note (Signed)
NAME:  [name], MRN:  [id number], DOB:  1951/10/28, LOS: 0 ADMISSION DATE:  06/02/2022, CONSULTATION DATE:  06/02/2022 REFERRING MD:  Dr. Lorin Mercy, CHIEF COMPLAINT:  GI Bleed, Hypotension    History of Present Illness:  71 year old female with recent admission 12/2021 for V.Fib arrest with saddle PE s/p thrombectomy, started on Eliquis, and then subsequent admission 4/29 with lower GI bleed secondary to hemorrhoids. Presents to ED 8/6 with reported rectal bleeding. 8/5 was more tired then normal and weak. Per patient she got up and went to bathroom this morning and had a gush of dark blood per rectum which happened 3-4 additional times. On arrival to ER patient had an additional episode of large volume dark red blood with noted blood clots. GI consulted. Hemoglobin 8.9. Given 1 unit RBC. BP 112/90. HR 90. Decision made to undergo NM red tag study given advanced CKD and inability to obtain CTA with concern for diverticular bleed.   PCCM consulted given ongoing bleeding and concern for hemodynamic stability.  Pertinent  Medical History  V.Fib Arrest secondary to saddle PE (12/2021) on Eliquis, ITP, anxiety/depression, osteoarthritis, fibromyalgia, asthma, Type 2 DM, GERD, HLD, HTN, admission 02/23/2022 with lower GI bleed secondary to hemorrhoids   Significant Hospital Events: Including procedures, antibiotic start and stop dates in addition to other pertinent events   8/6 > Presents to ED   Interim History / Subjective:  Patient denies abdominal pain.  Has not had further bloody bowel movement since arrival in ED.  Objective   Blood pressure 94/61, pulse 100, temperature 98.1 F (36.7 C), temperature source Oral, resp. rate 14, SpO2 100 %.       No intake or output data in the 24 hours ending 06/02/22 1432 There were no vitals filed for this visit.  Examination: General: Frail appearing but awake alert and fully cooperative. HENT: Mucous membranes are moist.  No pallor. Lungs: Chest  clear. Cardiovascular: Heart sounds are unremarkable. Abdomen: Abdomen is soft and nontender.  Rectal examination was deferred. Extremities: No peripheral edema Neuro: Neurologically intact GU: Deferred  Ancillary tests personally reviewed.  Small focus of bleeding found on tagged red cell scan localizing to the colon. Hemoglobin 8.9 Creatinine 2.19 Assessment & Plan:   GI bleed concern for diverticular bleed  -EGD 01/2022 with diverticulosis and hemorrhoids  GERD Acute blood loss anemia - previous hemoglobin 10.9 now 8.9  Plan -GI following > recommends NM red tag study. Currently pending  -Continue PPI BID -Transfuse for hemoglobin <7  -Continue to hold all anticoagulation   Hemorrhagic Shock  H/O V.Fib Arrest secondary to saddle pulmonary embolism on Eliquis  -Given Kcentra in ED  H/O HTN Plan -Maintain MAP >65 (currently not requiring use of vasopressors)  -Hold home antihypertensives -Transfuse as above.   Acute on CKD stage IV  -Crt 1.49 at discharge 01/2022 -Crt on admit 2.19 BUN 40  Plan -Trend BMP -Replace electrolytes as indicated   Type 2 DM Plan -Trend Glucose  -SSI  Chronic Pain/Fibromyalgia  -on home Cymbalta, paroxetine, temazepam, melatonin  Currently hemodynamically stable not requiring vasopressors with normal clinical perfusion.  Does not require ICU on this basis. As she is now 4 months into her DVT PE treatment, it is safe to stop her anticoagulation at least until we ascertain her risk of rebleeding.  Please reconsult if further questions arise. Best Practice (right click and "Reselect all SmartList Selections" daily)   Diet/type: NPO DVT prophylaxis: not indicated GI prophylaxis: PPI Lines: N/A  Foley:  N/A Code Status:  full code Last date of multidisciplinary goals of care discussion [patient personally updated 8/6.]  Kipp Brood, MD [facility] ICU Physician [place]  Pager: [phone] Or Epic Secure Chat After  hours: [phone].  06/02/2022, 6:56 PM

## 2022-06-03 ENCOUNTER — Inpatient Hospital Stay (HOSPITAL_COMMUNITY): Payer: Medicare PPO

## 2022-06-03 DIAGNOSIS — K922 Gastrointestinal hemorrhage, unspecified: Secondary | ICD-10-CM | POA: Diagnosis not present

## 2022-06-03 LAB — APTT: aPTT: 30 seconds (ref 24–36)

## 2022-06-03 LAB — CBC
HCT: 21.6 % — ABNORMAL LOW (ref 36.0–46.0)
HCT: 19.8 % — ABNORMAL LOW (ref 36.0–46.0)
Hemoglobin: 7.4 g/dL — ABNORMAL LOW (ref 12.0–15.0)
Hemoglobin: 6.9 g/dL — CL (ref 12.0–15.0)
MCH: 30 pg (ref 26.0–34.0)
MCH: 29.4 pg (ref 26.0–34.0)
MCHC: 34.3 g/dL (ref 30.0–36.0)
MCHC: 34.8 g/dL (ref 30.0–36.0)
MCV: 87.4 fL (ref 80.0–100.0)
MCV: 84.3 fL (ref 80.0–100.0)
Platelets: 176 10*3/uL (ref 150–400)
Platelets: 140 10*3/uL — ABNORMAL LOW (ref 150–400)
RBC: 2.47 MIL/uL — ABNORMAL LOW (ref 3.87–5.11)
RBC: 2.35 MIL/uL — ABNORMAL LOW (ref 3.87–5.11)
RDW: 13.7 % (ref 11.5–15.5)
RDW: 15.9 % — ABNORMAL HIGH (ref 11.5–15.5)
WBC: 10.2 10*3/uL (ref 4.0–10.5)
WBC: 10.4 10*3/uL (ref 4.0–10.5)
nRBC: 0 % (ref 0.0–0.2)
nRBC: 0 % (ref 0.0–0.2)

## 2022-06-03 LAB — PREPARE RBC (CROSSMATCH)

## 2022-06-03 LAB — BASIC METABOLIC PANEL
Anion gap: 9 (ref 5–15)
BUN: 34 mg/dL — ABNORMAL HIGH (ref 8–23)
CO2: 17 mmol/L — ABNORMAL LOW (ref 22–32)
Calcium: 7.8 mg/dL — ABNORMAL LOW (ref 8.9–10.3)
Chloride: 116 mmol/L — ABNORMAL HIGH (ref 98–111)
Creatinine, Ser: 1.79 mg/dL — ABNORMAL HIGH (ref 0.44–1.00)
GFR, Estimated: 30 mL/min — ABNORMAL LOW (ref >60)
Glucose, Bld: 201 mg/dL — ABNORMAL HIGH (ref 70–99)
Potassium: 4.3 mmol/L (ref 3.5–5.1)
Sodium: 142 mmol/L (ref 135–145)

## 2022-06-03 LAB — CBC WITH DIFFERENTIAL/PLATELET
Abs Immature Granulocytes: 0.02 10*3/uL (ref 0.00–0.07)
Basophils Absolute: 0.1 10*3/uL (ref 0.0–0.1)
Basophils Relative: 1 %
Eosinophils Absolute: 0.1 10*3/uL (ref 0.0–0.5)
Eosinophils Relative: 1 %
HCT: 25 % — ABNORMAL LOW (ref 36.0–46.0)
Hemoglobin: 8.6 g/dL — ABNORMAL LOW (ref 12.0–15.0)
Immature Granulocytes: 0 %
Lymphocytes Relative: 34 %
Lymphs Abs: 3.6 10*3/uL (ref 0.7–4.0)
MCH: 29.1 pg (ref 26.0–34.0)
MCHC: 34.4 g/dL (ref 30.0–36.0)
MCV: 84.5 fL (ref 80.0–100.0)
Monocytes Absolute: 0.8 10*3/uL (ref 0.1–1.0)
Monocytes Relative: 8 %
Neutro Abs: 6.2 10*3/uL (ref 1.7–7.7)
Neutrophils Relative %: 56 %
Platelets: 154 10*3/uL (ref 150–400)
RBC: 2.96 MIL/uL — ABNORMAL LOW (ref 3.87–5.11)
RDW: 15.6 % — ABNORMAL HIGH (ref 11.5–15.5)
WBC: 10.8 10*3/uL — ABNORMAL HIGH (ref 4.0–10.5)
nRBC: 0 % (ref 0.0–0.2)

## 2022-06-03 LAB — PROTIME-INR
INR: 1.3 — ABNORMAL HIGH (ref 0.8–1.2)
INR: 1.2 (ref 0.8–1.2)
Prothrombin Time: 16.1 seconds — ABNORMAL HIGH (ref 11.4–15.2)
Prothrombin Time: 14.9 seconds (ref 11.4–15.2)

## 2022-06-03 LAB — GLUCOSE, CAPILLARY
Glucose-Capillary: 178 mg/dL — ABNORMAL HIGH (ref 70–99)
Glucose-Capillary: 187 mg/dL — ABNORMAL HIGH (ref 70–99)

## 2022-06-03 MED ORDER — LACTATED RINGERS IV BOLUS
500.0000 mL | Freq: Once | INTRAVENOUS | Status: DC
Start: 2022-06-03 — End: 2022-06-03

## 2022-06-03 MED ORDER — LACTATED RINGERS IV BOLUS
500.0000 mL | Freq: Once | INTRAVENOUS | Status: AC
  Administered 2022-06-03: 500 mL via INTRAVENOUS

## 2022-06-03 MED ORDER — LACTATED RINGERS IV SOLN: INTRAVENOUS | Status: DC

## 2022-06-03 MED ORDER — SODIUM CHLORIDE 0.9% IV SOLUTION: Freq: Once | INTRAVENOUS | Status: AC

## 2022-06-03 MED ORDER — LIDOCAINE 5 % EX PTCH
1.0000 | MEDICATED_PATCH | CUTANEOUS | Status: DC
  Administered 2022-06-03 – 2022-06-08 (×5): 1 via TRANSDERMAL
  Filled 2022-06-03 (×7): qty 1

## 2022-06-03 MED ORDER — ADULT MULTIVITAMIN W/MINERALS CH
1.0000 | ORAL_TABLET | Freq: Every day | ORAL | Status: DC
  Administered 2022-06-03 – 2022-06-09 (×6): 1 via ORAL
  Filled 2022-06-03 (×7): qty 1

## 2022-06-03 MED ORDER — SODIUM CHLORIDE 0.9% IV SOLUTION: Freq: Once | INTRAVENOUS | Status: DC

## 2022-06-03 MED ORDER — IOHEXOL 350 MG/ML SOLN
90.0000 mL | Freq: Once | INTRAVENOUS | Status: AC | PRN
Start: 2022-06-03 — End: 2022-06-03
  Administered 2022-06-03: 90 mL via INTRAVENOUS

## 2022-06-03 MED ORDER — ENSURE ENLIVE PO LIQD
237.0000 mL | Freq: Three times a day (TID) | ORAL | Status: DC
  Administered 2022-06-03 – 2022-06-04 (×2): 237 mL via ORAL

## 2022-06-03 MED ORDER — CHLORHEXIDINE GLUCONATE CLOTH 2 % EX PADS
6.0000 | MEDICATED_PAD | Freq: Every day | CUTANEOUS | Status: DC
Start: 2022-06-03 — End: 2022-06-09
  Administered 2022-06-03 – 2022-06-09 (×7): 6 via TOPICAL

## 2022-06-03 NOTE — Progress Notes (Signed)
       CROSS COVER NOTE  NAME: [name] MRN: [id number] DOB : 12-20-1950   [name] is a 72 yo F PMH cardiac arrest in 10/2021, CKD-4, DM, HTN, Hyperlipidemia, and Pulmonary Embolism who presents with GI bleeding while on outpatient Eliquis.   1905: Notified by nursing of critical HGB 4.5. [name] endorses dizziness and lightheadedness. Will transfuse 2U PRBC and 1L of LR at this time  0007: [name] is having another episode of hematochezia. Nursing reports she has filled up 3 bedpans with bloody stool. At this time she has not received second unit of blood. Second unit of PRBCs to be given now. Talked with Dr Lillard Anes, IR who recommended CT GI Bleed despite creatinine of 2.19 and IV hydration after study.  CT GI bleed negative. Dr Lillard Anes updated.  0530: Nursing reports another episode of hematochezia, 364m. BP 80/45. Spoke with Dr GPatsey Berthold PCCM who recommended additional blood and volume repletion.  This document was prepared using Dragon voice recognition software and may include unintentional dictation errors.  KNeomia GlassDNP, MHA, FNP-BC Nurse Practitioner [facility] LPPager [phone]

## 2022-06-03 NOTE — Progress Notes (Addendum)
eLink Physician-Brief Progress Note Patient Name: [name] DOB: 02-05-1951 MRN: [id number]   Date of Service  06/03/2022  HPI/Events of Note  Notified that patient had a bloody BM. Has passed maroon stool with clots, and RN says it was upto 700 ml. No symptoms. No hypotension MAP is in 70s. HR is 90. Very comfortable in her bed, on camera. Has 2nd unit RBC infusing.   eICU Interventions  Asked RN to increase the rate of the 2nd unit and finish it and send a CBC BMP to see if she is responding well to the transfusion. Her hemodynamics are totally stable. CTA done yesterday did not show active extravasation.      Intervention Category Major Interventions: Hemorrhage - evaluation and management  Elyanna Wallick G Tonna Palazzi 06/03/2022, 11:03 PM  Addendum at 2:25 am Repeat Hemoglobin is 7.5 from 6.8 s/p 2 units  Has had 2 bowel movements overnight which are maroon and with clots No fresh red blood No belly pain Current HR in 80s. Current SBP 110 and she is asleep Ground CCM team had spoken with GI  Plan to continue management and possibly repeat a tagged study if she has active bleeding. But if actively bleeding and unable to do tagged study or compromised hemodynamics then will need IR to consult I ordered another unit of RBC to be transfused and d/w bedside RN as well  Continue serial CBC checks

## 2022-06-03 NOTE — Progress Notes (Signed)
Pt transported from 5N bed to 3O-12 with no complications.  Patient now resting comfortably in bed with no complaints.  Will continue to monitor.

## 2022-06-03 NOTE — Progress Notes (Signed)
Subjective: Several bloody stools overnight. Bleeding stopped this morning, then she had some more early this afternoon.  Objective: Vital signs in last 24 hours: Temp:  [97.8 F (36.6 C)-99.7 F (37.6 C)] 98.7 F (37.1 C) (08/07 1125) Pulse Rate:  [75-116] 102 (08/07 1400) Resp:  [9-22] 13 (08/07 1400) BP: (67-123)/(46-85) 105/58 (08/07 1400) SpO2:  [97 %-100 %] 99 % (08/07 1400) Weight change:  Last BM Date : 06/03/22  PE: GEN:  NAD ABD:  Soft, non-tender  Lab Results: CBC    Component Value Date/Time   WBC 10.8 (H) 06/03/2022 1128   RBC 2.96 (L) 06/03/2022 1128   HGB 8.6 (L) 06/03/2022 1128   HGB 10.9 (L) 04/26/2022 0952   HCT 25.0 (L) 06/03/2022 1128   PLT 154 06/03/2022 1128   PLT 450 (H) 04/26/2022 0952   MCV 84.5 06/03/2022 1128   MCH 29.1 06/03/2022 1128   MCHC 34.4 06/03/2022 1128   RDW 15.6 (H) 06/03/2022 1128   LYMPHSABS 3.6 06/03/2022 1128   MONOABS 0.8 06/03/2022 1128   EOSABS 0.1 06/03/2022 1128   BASOSABS 0.1 06/03/2022 1128  CMP     Component Value Date/Time   NA 142 06/03/2022 0341   NA 139 01/23/2022 0000   K 4.3 06/03/2022 0341   CL 116 (H) 06/03/2022 0341   CO2 17 (L) 06/03/2022 0341   GLUCOSE 201 (H) 06/03/2022 0341   BUN 34 (H) 06/03/2022 0341   BUN 23 (A) 01/23/2022 0000   CREATININE 1.79 (H) 06/03/2022 0341   CREATININE 1.83 (H) 04/26/2022 0952   CREATININE 1.14 (H) 03/06/2017 1502   CALCIUM 7.8 (L) 06/03/2022 0341   PROT 5.6 (L) 06/02/2022 1100   ALBUMIN 2.7 (L) 06/02/2022 1100   AST 20 06/02/2022 1100   AST 16 04/26/2022 0952   ALT 15 06/02/2022 1100   ALT 11 04/26/2022 0952   ALKPHOS 58 06/02/2022 1100   BILITOT 0.4 06/02/2022 1100   BILITOT 0.4 04/26/2022 0952   GFRNONAA 30 (L) 06/03/2022 0341   GFRNONAA 29 (L) 04/26/2022 0952   GFRNONAA 50 (L) 03/06/2017 1502   GFRAA 51 (L) 04/11/2019 1121   GFRAA 57 (L) 03/06/2017 1502   Assessment:   Hematochezia.  Most likely diverticular bleeding. Acute blood loss  anemia. Pulmonary embolism. Chronic anticoagulation, apixaban, for #3 above, on hold.  Plan:   Ok to continue full liquid diet. Blood I saw in bedpan looked maroon with clots, and about 50 cc's. Would follow hemodynamics, CBCs, transfuse as needed, volume replete. Anticoagulation on hold. If further recurrent repetitive bleeding, would repeat tagged RBC study. Patient had endoscopy and colonoscopy for bleeding in April, with hemorrhoids and diverticulosis seen.  Yield of repeat endoscopic evaluation is very low. [facility] will follow. Case reviewed with patient, patient's family, and nurse at bedside.   Landry Dyke 06/03/2022, 2:41 PM   Cell [phone] If no answer or after 5 PM call [phone]

## 2022-06-03 NOTE — Progress Notes (Signed)
NAME:  [name], MRN:  [id number], DOB:  06-03-1951, LOS: 1 ADMISSION DATE:  06/02/2022, CONSULTATION DATE:  06/03/22 REFERRING MD:  Otilio Miu, NP , CHIEF COMPLAINT:  lower gi bleeding   History of Present Illness:  71 year old woman with with recent admission 12/2021 for V.Fib arrest with saddle PE s/p thrombectomy, started on Eliquis, and then subsequent admission 4/29 with lower GI bleed secondary to hemorrhoids.   Presents to ED 8/6 with reported rectal bleeding. 8/5 was more tired then normal and weak. Per patient she got up and went to bathroom this morning and had a gush of dark blood per rectum which happened 3-4 additional times. On arrival to ER patient had an additional episode of large volume dark red blood with noted blood clots. GI consulted. Hemoglobin 8.9. Given 1 unit RBC. BP 112/90. HR 90. Amitted to 5N.   Overnight continued to be somewhat hypotensive, received 2 u PRBC.  Hb came up from 4.5 to 7, But had another episode of 300cc of melena.   BP remained low [id number] systolic.   CTA this morning did not reveal source of bleeding.    PCCM consulted given ongoing bleeding and concern for hemodynamic stability.  NM scan: 1752:  IMPRESSION: 1. Faint area of increased radiotracer activity localizing to the right lower quadrant during the second hour of imaging, without evidence of propagation to suggest peristalsis. Findings are equivocal for active gastrointestinal bleeding in the region of the distal small bowel or cecum. Pertinent  Medical History   V.Fib Arrest secondary to saddle PE (12/2021) on Eliquis, ITP, anxiety/depression, osteoarthritis, fibromyalgia, asthma, Type 2 DM, GERD, HLD, HTN, admission 02/23/2022 with lower GI bleed secondary to hemorrhoids   Significant Hospital Events: Including procedures, antibiotic start and stop dates in addition to other pertinent events   8/7 Transferred to ICU, s/p 3u pRBC  Interim History / Subjective:    Objective   Blood  pressure 114/85, pulse 83, temperature 98.6 F (37 C), temperature source Oral, resp. rate 17, SpO2 100 %.        Intake/Output Summary (Last 24 hours) at 06/03/2022 9147 Last data filed at 06/03/2022 0500 Gross per 24 hour  Intake 1544.64 ml  Output 300 ml  Net 1244.64 ml   Examination: General: awake, alert, NAD, conversationally appropriate HEENT: no pallor, MMM CV: RRR, no murmurs auscultated Pulm: CTAB, normal WOB Abdomen: soft, nontender, nondistended, normoactive BS Extremities: no edema present  Resolved Hospital Problem list     Assessment & Plan:  GI bleed concern for diverticular bleed  Acute blood loss anemia EGD and colonoscopy 01/2022 with diverticulosis and hemorrhoids, mild gastritis. Hb dropped into the 4s overnight. Transferred to ICU. AM CBC w/ Hgb 7.4. Now s/p 3u pRBC.  -GI following, per chart review if GI bleeding scan positive then likely IR consult for embolization -NM GI scan snotes active GI bleed in region of distal small bowel or cecum. CTA unable to identify bleed.  -Continue PPI BID -Continue to hold all anticoagulation  -f/u post H&H -CBC q6h   Hemorrhagic Shock  H/O V.Fib Arrest secondary to saddle pulmonary embolism on Eliquis. Received Kcentra in ED. H/o HTN. Blood pressure improved s/p 3u pRBC -Maintain MAP >65  -Hold home antihypertensives  Acute on CKD stage IV  Crt 1.49 at discharge 01/2022. Crt on admit 2.19 BUN 40 -Trend BMP, Crt improved to 1.79 -Replace electrolytes as indicated    Type 2 DM -Trend Glucose, no SSI currently -NPO currently  Chronic Pain/Fibromyalgia  -on home Cymbalta, paroxetine, temazepam, melatonin  Diet/type: NPO DVT prophylaxis: not indicated GI prophylaxis: PPI Lines: N/A PIV x2 Foley:  N/A Code Status:  full code Last date of multidisciplinary goals of care discussion '[]'$   Labs   CBC: Recent Labs  Lab 06/02/22 1100 06/02/22 1812 06/03/22 0341  WBC 7.3 6.0 10.2  NEUTROABS 3.6  --   --   HGB  8.9* 4.5* 7.4*  HCT 28.2* 15.7* 21.6*  MCV 86.8 94.0 87.4  PLT 296 170 973   Basic Metabolic Panel: Recent Labs  Lab 06/02/22 1100 06/03/22 0341  NA 140 142  K 4.3 4.3  CL 110 116*  CO2 22 17*  GLUCOSE 293* 201*  BUN 40* 34*  CREATININE 2.19* 1.79*  CALCIUM 8.3* 7.8*   GFR: Estimated Creatinine Clearance: 23.6 mL/min (A) (by C-G formula based on SCr of 1.79 mg/dL (H)). Recent Labs  Lab 06/02/22 1100 06/02/22 1812 06/03/22 0341  WBC 7.3 6.0 10.2   Liver Function Tests: Recent Labs  Lab 06/02/22 1100  AST 20  ALT 15  ALKPHOS 58  BILITOT 0.4  PROT 5.6*  ALBUMIN 2.7*   No results for input(s): "LIPASE", "AMYLASE" in the last 168 hours. No results for input(s): "AMMONIA" in the last 168 hours.  ABG    Component Value Date/Time   PHART 7.457 (H) 11/08/2021 1335   PCO2ART 32.5 11/08/2021 1335   PO2ART 83 11/08/2021 1335   HCO3 13.3 (L) 11/10/2021 2239   TCO2 14 (L) 11/10/2021 2239   ACIDBASEDEF 12.0 (H) 11/10/2021 2239   O2SAT 64.0 11/10/2021 2239     Coagulation Profile: Recent Labs  Lab 06/02/22 1100 06/03/22 0341  INR 1.3* 1.3*   Cardiac Enzymes: No results for input(s): "CKTOTAL", "CKMB", "CKMBINDEX", "TROPONINI" in the last 168 hours.  HbA1C: Hgb A1c MFr Bld  Date/Time Value Ref Range Status  01/09/2022 06:54 PM 6.3 (H) 4.8 - 5.6 % Final    Comment:    (NOTE)         Prediabetes: 5.7 - 6.4         Diabetes: >6.4         Glycemic control for adults with diabetes: <7.0   10/28/2021 03:54 PM 9.0 (H) 4.8 - 5.6 % Final    Comment:    (NOTE) Pre diabetes:          5.7%-6.4%  Diabetes:              >6.4%  Glycemic control for   <7.0% adults with diabetes     CBG: Recent Labs  Lab 06/03/22 0705  GLUCAP 178*    Review of Systems:   Review of Systems  Constitutional:  Negative for fever.  HENT:  Negative for hearing loss.   Eyes:  Negative for blurred vision.  Respiratory:  Negative for cough.   Cardiovascular:  Negative for chest  pain.  Gastrointestinal:  Positive for blood in stool and melena. Negative for heartburn, nausea and vomiting.  Genitourinary:  Negative for dysuria.  Musculoskeletal:  Negative for myalgias.  Skin:  Negative for rash.  Neurological:  Negative for dizziness.  Endo/Heme/Allergies:  Does not bruise/bleed easily.  Psychiatric/Behavioral:  Negative for depression.    Past Medical History:  She,  has a past medical history of Anxiety, Arthritis, Asthma, Cardiac arrest ([place]) (10/28/2021), Depression, Diabetes mellitus without complication ([place]), Fibromyalgia, GERD (gastroesophageal reflux disease), Hyperlipemia, Hypertension, and PONV (postoperative nausea and vomiting).   Surgical History:   Past Surgical History:  Procedure Laterality Date   COLONOSCOPY     COLONOSCOPY WITH PROPOFOL N/A 02/20/2022   Procedure: COLONOSCOPY WITH PROPOFOL;  Surgeon: Otis Brace, MD;  Location: [place];  Service: Gastroenterology;  Laterality: N/A;   DILATION AND CURETTAGE OF UTERUS     ESOPHAGOGASTRODUODENOSCOPY N/A 02/20/2022   Procedure: ESOPHAGOGASTRODUODENOSCOPY (EGD);  Surgeon: Otis Brace, MD;  [facility] ENDOSCOPY;  Service: Gastroenterology;  Laterality: N/A;   IR EMBO ART  VEN HEMORR LYMPH EXTRAV  INC GUIDE ROADMAPPING  11/01/2021   IR FLUORO GUIDE CV LINE RIGHT  11/20/2021   IR REMOVAL TUN CV CATH W/O FL  12/20/2021   IR THROMBECT PRIM MECH INIT (INCLU) MOD SED  10/29/2021   IR US GUIDE VASC ACCESS RIGHT  10/29/2021   IR US GUIDE VASC ACCESS RIGHT  11/01/2021   IR US GUIDE VASC ACCESS RIGHT  11/21/2021   KNEE ARTHROSCOPY  [id number]   left   POLYPECTOMY  02/20/2022   Procedure: POLYPECTOMY;  Surgeon: Otis Brace, MD;  Location: [place];  Service: Gastroenterology;;   SHOULDER ARTHROSCOPY     left   TRIGGER FINGER RELEASE  08/13/2012   Procedure: RELEASE TRIGGER FINGER/A-1 PULLEY;  Surgeon: [name]., MD;  Location: [place];  Service: Orthopedics;   Laterality: Right;  long finger   TUBAL LIGATION       Social History:   reports that she quit smoking about 11 years ago. Her smoking use included cigarettes. She has a 10.00 pack-year smoking history. She has never used smokeless tobacco. She reports that she does not drink alcohol and does not use drugs.   Family History:  Her family history includes Diabetes in her mother and sister; Heart attack in her father; Hypertension in her daughter; Throat cancer in her brother.   Allergies Allergies  Allergen Reactions   Darvon [Propoxyphene] Itching   Hydrocodone Bit-Homatrop Mbr Itching   Metformin Nausea And Vomiting   Pentazocine Other (See Comments)      jitters   Percocet [Oxycodone-Acetaminophen] Itching   Sulfa Antibiotics Hives   Tetracaine Hcl Itching   Tetracyclines & Related Hives   Tramadol Itching   Benadryl [Diphenhydramine] Itching and Other (See Comments)    Can only take dye free. States the dye causes itching.   Other     Other reaction(s): Unknown. States reaction was to nasal spray that caused pupils to shrink   Aspirin Nausea And Vomiting     Home Medications  Prior to Admission medications   Medication Sig Start Date End Date Taking? Authorizing Provider  acetaminophen (TYLENOL) 325 MG tablet Take 2 tablets (650 mg total) by mouth every 4 (four) hours as needed for fever (greater than 37.6 C). Patient taking differently: Take 650 mg by mouth daily as needed (pain). 01/07/22  Yes Angiulli, Lavon Paganini, PA-C  albuterol (VENTOLIN HFA) 108 (90 Base) MCG/ACT inhaler Inhale 1-2 puffs into the lungs every 4 (four) hours as needed for wheezing or shortness of breath. Patient taking differently: Inhale 2 puffs into the lungs daily as needed for wheezing or shortness of breath. 02/14/22  Yes Medina-Vargas, Monina C, NP  amLODipine (NORVASC) 10 MG tablet Take 10 mg by mouth daily. 03/06/22  Yes [provider]  apixaban (ELIQUIS) 5 MG TABS tablet Take 1 tablet (5 mg  total) by mouth 2 (two) times daily. 02/14/22  Yes Medina-Vargas, Monina C, NP  atorvastatin (LIPITOR) 20 MG tablet Take 1 tablet (20 mg total) by mouth daily. 02/14/22  Yes  Medina-Vargas, Monina C, NP  B Complex-C (B-COMPLEX WITH VITAMIN C) tablet Take 1 tablet by mouth daily. 01/08/22  Yes Angiulli, Lavon Paganini, PA-C  dicyclomine (BENTYL) 10 MG capsule Take 1 capsule (10 mg total) by mouth 3 (three) times daily before meals. Patient taking differently: Take 10 mg by mouth 2 (two) times daily as needed (stomach cramps). 02/14/22  Yes Medina-Vargas, Monina C, NP  Ferrous Sulfate (IRON PO) Take 1 tablet by mouth daily.   Yes [provider]  FLOVENT HFA 220 MCG/ACT inhaler Inhale 2 puffs into the lungs as needed (wheezing/SOB). 03/01/22  Yes [provider]  Glucerna (GLUCERNA) LIQD Take 237 mLs by mouth 2 (two) times daily between meals.   Yes [provider]  lidocaine (LIDODERM) 5 % Place 2 patches onto the skin daily. Remove & Discard patch within 12 hours or as directed by MD Patient taking differently: Place 1 patch onto the skin daily as needed (pain). Remove & Discard patch within 12 hours or as directed by MD 02/14/22  Yes Medina-Vargas, Monina C, NP  ondansetron (ZOFRAN) 4 MG tablet Take 4 mg by mouth as needed for nausea or vomiting. 06/01/22  Yes [provider]  oxyCODONE (OXY IR/ROXICODONE) 5 MG immediate release tablet Take 5 mg by mouth daily as needed for severe pain.   Yes [provider]  pantoprazole (PROTONIX) 40 MG tablet Take 1 tablet (40 mg total) by mouth 2 (two) times daily. 02/14/22  Yes Medina-Vargas, Monina C, NP  simvastatin (ZOCOR) 40 MG tablet Take 40 mg by mouth daily.   Yes [provider]  Vitamin D, Ergocalciferol, (DRISDOL) 1.25 MG (50000 UNIT) CAPS capsule Take 1 capsule (50,000 Units total) by mouth once a week. 01/07/22  Yes Angiulli, Lavon Paganini, PA-C  DULoxetine (CYMBALTA) 20 MG capsule Take 1 capsule (20 mg total) by mouth  daily. Patient not taking: Reported on 06/02/2022 02/14/22   Medina-Vargas, Monina C, NP  folic acid (FOLVITE) 1 MG tablet Take 2 tablets (2 mg total) by mouth daily. Patient not taking: Reported on 06/02/2022 02/14/22   Medina-Vargas, Monina C, NP  melatonin 5 MG TABS Take 1 tablet (5 mg total) by mouth every evening. Patient not taking: Reported on 06/02/2022 02/14/22   Medina-Vargas, Monina C, NP  PARoxetine (PAXIL) 40 MG tablet Take by mouth daily. Patient not taking: Reported on 06/02/2022 03/06/22   [provider]  temazepam (RESTORIL) 7.5 MG capsule Take 1 capsule (7.5 mg total) by mouth at bedtime as needed for sleep. Patient not taking: Reported on 06/02/2022 03/20/22   Volanda Napoleon, MD     Critical care time: 8 min    Wells Guiles, DO 06/03/2022, 9:52 AM PGY-2, [place] for pager If no response to pager, please call PCCM consult pager After 7:00 pm call Elink

## 2022-06-03 NOTE — Progress Notes (Signed)
NAME:  [name], MRN:  [id number], DOB:  07/11/1951, LOS: 1 ADMISSION DATE:  06/02/2022, CONSULTATION DATE:  06/03/22 REFERRING MD:  Otilio Miu, NP , CHIEF COMPLAINT:  lower gi bleeding   History of Present Illness:  71 year old woman with with recent admission 12/2021 for V.Fib arrest with saddle PE s/p thrombectomy, started on Eliquis, and then subsequent admission 4/29 with lower GI bleed secondary to hemorrhoids.   Presents to ED 8/6 with reported rectal bleeding. 8/5 was more tired then normal and weak. Per patient she got up and went to bathroom this morning and had a gush of dark blood per rectum which happened 3-4 additional times. On arrival to ER patient had an additional episode of large volume dark red blood with noted blood clots. GI consulted. Hemoglobin 8.9. Given 1 unit RBC. BP 112/90. HR 90. Amitted to 5N.   Overnight continued to be somewhat hypotensive, received 2 u PRBC.  Hb came up from 4.5 to 7, But had another episode of 300cc of melena.   BP remained low 56O-13Y systolic.   CTA this morning did not reveal source of bleeding.    PCCM consulted given ongoing bleeding and concern for hemodynamic stability.   NM scan: 1752:  IMPRESSION: 1. Faint area of increased radiotracer activity localizing to the right lower quadrant during the second hour of imaging, without evidence of propagation to suggest peristalsis. Findings are equivocal for active gastrointestinal bleeding in the region of the distal small bowel or cecum. Pertinent  Medical History   V.Fib Arrest secondary to saddle PE (12/2021) on Eliquis, ITP, anxiety/depression, osteoarthritis, fibromyalgia, asthma, Type 2 DM, GERD, HLD, HTN, admission 02/23/2022 with lower GI bleed secondary to hemorrhoids   Significant Hospital Events: Including procedures, antibiotic start and stop dates in addition to other pertinent events     Interim History / Subjective:    Objective   Blood pressure (!) 67/46, pulse (!) 110,  temperature 98.5 F (36.9 C), temperature source Oral, resp. rate 12, SpO2 100 %.        Intake/Output Summary (Last 24 hours) at 06/03/2022 8657 Last data filed at 06/03/2022 0500 Gross per 24 hour  Intake 1544.64 ml  Output 300 ml  Net 1244.64 ml   There were no vitals filed for this visit.  Examination: General: Frail appearing but awake alert and fully cooperative.  Pleasant  HENT: Mucous membranes are moist.  No pallor. Lungs: Chest clear. Cardiovascular: Heart sounds are unremarkable. Abdomen: Abdomen is soft and nontender.  Rectal examination was deferred. Extremities: No peripheral edema Neuro: Neurologically intact GU: Deferred  Resolved Hospital Problem list     Assessment & Plan:  GI bleed concern for diverticular bleed  -EGD 01/2022 with diverticulosis and hemorrhoids  GERD Acute blood loss anemia - Hb dropped into the 4s overnight.   Blood pressure has remained low all night.  -Continue PPI BID -Continue to hold all anticoagulation  Transfer to ICU for closer monitoring.    Hemorrhagic Shock  H/O V.Fib Arrest secondary to saddle pulmonary embolism on Eliquis  -Given Kcentra in ED  H/O HTN Plan -Maintain MAP >65  Will transfuse now given acute bleeding and hypotension.   -Hold home antihypertensives    Acute on CKD stage IV  -Crt 1.49 at discharge 01/2022 -Crt on admit 2.19 BUN 40  Plan -Trend BMP -Replace electrolytes as indicated    Type 2 DM Plan -Trend Glucose  -SSI   Chronic Pain/Fibromyalgia  -on home Cymbalta, paroxetine, temazepam,  melatonin   Diet/type: NPO DVT prophylaxis: not indicated GI prophylaxis: PPI Lines: N/A Foley:  N/A Code Status:  full code Last date of multidisciplinary goals of care discussion '[]'$   Labs   CBC: Recent Labs  Lab 06/02/22 1100 06/02/22 1812 06/03/22 0341  WBC 7.3 6.0 10.2  NEUTROABS 3.6  --   --   HGB 8.9* 4.5* 7.4*  HCT 28.2* 15.7* 21.6*  MCV 86.8 94.0 87.4  PLT 296 170 176    Basic  Metabolic Panel: Recent Labs  Lab 06/02/22 1100 06/03/22 0341  NA 140 142  K 4.3 4.3  CL 110 116*  CO2 22 17*  GLUCOSE 293* 201*  BUN 40* 34*  CREATININE 2.19* 1.79*  CALCIUM 8.3* 7.8*   GFR: Estimated Creatinine Clearance: 23.6 mL/min (A) (by C-G formula based on SCr of 1.79 mg/dL (H)). Recent Labs  Lab 06/02/22 1100 06/02/22 1812 06/03/22 0341  WBC 7.3 6.0 10.2    Liver Function Tests: Recent Labs  Lab 06/02/22 1100  AST 20  ALT 15  ALKPHOS 58  BILITOT 0.4  PROT 5.6*  ALBUMIN 2.7*   No results for input(s): "LIPASE", "AMYLASE" in the last 168 hours. No results for input(s): "AMMONIA" in the last 168 hours.  ABG    Component Value Date/Time   PHART 7.457 (H) 11/08/2021 1335   PCO2ART 32.5 11/08/2021 1335   PO2ART 83 11/08/2021 1335   HCO3 13.3 (L) 11/10/2021 2239   TCO2 14 (L) 11/10/2021 2239   ACIDBASEDEF 12.0 (H) 11/10/2021 2239   O2SAT 64.0 11/10/2021 2239     Coagulation Profile: Recent Labs  Lab 06/02/22 1100 06/03/22 0341  INR 1.3* 1.3*    Cardiac Enzymes: No results for input(s): "CKTOTAL", "CKMB", "CKMBINDEX", "TROPONINI" in the last 168 hours.  HbA1C: Hgb A1c MFr Bld  Date/Time Value Ref Range Status  01/09/2022 06:54 PM 6.3 (H) 4.8 - 5.6 % Final    Comment:    (NOTE)         Prediabetes: 5.7 - 6.4         Diabetes: >6.4         Glycemic control for adults with diabetes: <7.0   10/28/2021 03:54 PM 9.0 (H) 4.8 - 5.6 % Final    Comment:    (NOTE) Pre diabetes:          5.7%-6.4%  Diabetes:              >6.4%  Glycemic control for   <7.0% adults with diabetes     CBG: No results for input(s): "GLUCAP" in the last 168 hours.  Review of Systems:   Review of Systems  Constitutional:  Negative for fever.  HENT:  Negative for hearing loss.   Eyes:  Negative for blurred vision.  Respiratory:  Negative for cough.   Cardiovascular:  Negative for chest pain.  Gastrointestinal:  Positive for blood in stool and melena. Negative  for heartburn, nausea and vomiting.  Genitourinary:  Negative for dysuria.  Musculoskeletal:  Negative for myalgias.  Skin:  Negative for rash.  Neurological:  Negative for dizziness.  Endo/Heme/Allergies:  Does not bruise/bleed easily.  Psychiatric/Behavioral:  Negative for depression.      Past Medical History:  She,  has a past medical history of Anxiety, Arthritis, Asthma, Cardiac arrest ([place]) (10/28/2021), Depression, Diabetes mellitus without complication ([place]), Fibromyalgia, GERD (gastroesophageal reflux disease), Hyperlipemia, Hypertension, and PONV (postoperative nausea and vomiting).   Surgical History:   Past Surgical History:  Procedure Laterality Date  COLONOSCOPY     COLONOSCOPY WITH PROPOFOL N/A 02/20/2022   Procedure: COLONOSCOPY WITH PROPOFOL;  Surgeon: Otis Brace, MD;  Location: [place];  Service: Gastroenterology;  Laterality: N/A;   DILATION AND CURETTAGE OF UTERUS     ESOPHAGOGASTRODUODENOSCOPY N/A 02/20/2022   Procedure: ESOPHAGOGASTRODUODENOSCOPY (EGD);  Surgeon: Otis Brace, MD;  Location: [facility] ENDOSCOPY;  Service: Gastroenterology;  Laterality: N/A;   IR EMBO ART  VEN HEMORR LYMPH EXTRAV  INC GUIDE ROADMAPPING  11/01/2021   IR FLUORO GUIDE CV LINE RIGHT  11/20/2021   IR REMOVAL TUN CV CATH W/O FL  12/20/2021   IR THROMBECT PRIM MECH INIT (INCLU) MOD SED  10/29/2021   IR US GUIDE VASC ACCESS RIGHT  10/29/2021   IR US GUIDE VASC ACCESS RIGHT  11/01/2021   IR US GUIDE VASC ACCESS RIGHT  11/21/2021   KNEE ARTHROSCOPY  [id number]   left   POLYPECTOMY  02/20/2022   Procedure: POLYPECTOMY;  Surgeon: Otis Brace, MD;  Location: [place];  Service: Gastroenterology;;   SHOULDER ARTHROSCOPY     left   TRIGGER FINGER RELEASE  08/13/2012   Procedure: RELEASE TRIGGER FINGER/A-1 PULLEY;  Surgeon: [name]., MD;  Location: [place];  Service: Orthopedics;  Laterality: Right;  long finger   TUBAL LIGATION       Social History:    reports that she quit smoking about 11 years ago. Her smoking use included cigarettes. She has a 10.00 pack-year smoking history. She has never used smokeless tobacco. She reports that she does not drink alcohol and does not use drugs.   Family History:  Her family history includes Diabetes in her mother and sister; Heart attack in her father; Hypertension in her daughter; Throat cancer in her brother.   Allergies Allergies  Allergen Reactions   Darvon [Propoxyphene] Itching   Hydrocodone Bit-Homatrop Mbr Itching   Metformin Nausea And Vomiting   Pentazocine Other (See Comments)      jitters   Percocet [Oxycodone-Acetaminophen] Itching   Sulfa Antibiotics Hives   Tetracaine Hcl Itching   Tetracyclines & Related Hives   Tramadol Itching   Benadryl [Diphenhydramine] Itching and Other (See Comments)    Can only take dye free. States the dye causes itching.   Other     Other reaction(s): Unknown. States reaction was to nasal spray that caused pupils to shrink   Aspirin Nausea And Vomiting     Home Medications  Prior to Admission medications   Medication Sig Start Date End Date Taking? Authorizing Provider  acetaminophen (TYLENOL) 325 MG tablet Take 2 tablets (650 mg total) by mouth every 4 (four) hours as needed for fever (greater than 37.6 C). Patient taking differently: Take 650 mg by mouth daily as needed (pain). 01/07/22  Yes Angiulli, Lavon Paganini, PA-C  albuterol (VENTOLIN HFA) 108 (90 Base) MCG/ACT inhaler Inhale 1-2 puffs into the lungs every 4 (four) hours as needed for wheezing or shortness of breath. Patient taking differently: Inhale 2 puffs into the lungs daily as needed for wheezing or shortness of breath. 02/14/22  Yes Medina-Vargas, Monina C, NP  amLODipine (NORVASC) 10 MG tablet Take 10 mg by mouth daily. 03/06/22  Yes [provider]  apixaban (ELIQUIS) 5 MG TABS tablet Take 1 tablet (5 mg total) by mouth 2 (two) times daily. 02/14/22  Yes Medina-Vargas, Monina C,  NP  atorvastatin (LIPITOR) 20 MG tablet Take 1 tablet (20 mg total) by mouth daily. 02/14/22  Yes Medina-Vargas, Monina C, NP  B Complex-C (B-COMPLEX WITH VITAMIN C) tablet Take 1 tablet by mouth daily. 01/08/22  Yes Angiulli, Lavon Paganini, PA-C  dicyclomine (BENTYL) 10 MG capsule Take 1 capsule (10 mg total) by mouth 3 (three) times daily before meals. Patient taking differently: Take 10 mg by mouth 2 (two) times daily as needed (stomach cramps). 02/14/22  Yes Medina-Vargas, Monina C, NP  Ferrous Sulfate (IRON PO) Take 1 tablet by mouth daily.   Yes [provider]  FLOVENT HFA 220 MCG/ACT inhaler Inhale 2 puffs into the lungs as needed (wheezing/SOB). 03/01/22  Yes [provider]  Glucerna (GLUCERNA) LIQD Take 237 mLs by mouth 2 (two) times daily between meals.   Yes [provider]  lidocaine (LIDODERM) 5 % Place 2 patches onto the skin daily. Remove & Discard patch within 12 hours or as directed by MD Patient taking differently: Place 1 patch onto the skin daily as needed (pain). Remove & Discard patch within 12 hours or as directed by MD 02/14/22  Yes Medina-Vargas, Monina C, NP  ondansetron (ZOFRAN) 4 MG tablet Take 4 mg by mouth as needed for nausea or vomiting. 06/01/22  Yes [provider]  oxyCODONE (OXY IR/ROXICODONE) 5 MG immediate release tablet Take 5 mg by mouth daily as needed for severe pain.   Yes [provider]  pantoprazole (PROTONIX) 40 MG tablet Take 1 tablet (40 mg total) by mouth 2 (two) times daily. 02/14/22  Yes Medina-Vargas, Monina C, NP  simvastatin (ZOCOR) 40 MG tablet Take 40 mg by mouth daily.   Yes [provider]  Vitamin D, Ergocalciferol, (DRISDOL) 1.25 MG (50000 UNIT) CAPS capsule Take 1 capsule (50,000 Units total) by mouth once a week. 01/07/22  Yes Angiulli, Lavon Paganini, PA-C  DULoxetine (CYMBALTA) 20 MG capsule Take 1 capsule (20 mg total) by mouth daily. Patient not taking: Reported on 06/02/2022 02/14/22   Medina-Vargas,  Monina C, NP  folic acid (FOLVITE) 1 MG tablet Take 2 tablets (2 mg total) by mouth daily. Patient not taking: Reported on 06/02/2022 02/14/22   Medina-Vargas, Monina C, NP  melatonin 5 MG TABS Take 1 tablet (5 mg total) by mouth every evening. Patient not taking: Reported on 06/02/2022 02/14/22   Medina-Vargas, Monina C, NP  PARoxetine (PAXIL) 40 MG tablet Take by mouth daily. Patient not taking: Reported on 06/02/2022 03/06/22   [provider]  temazepam (RESTORIL) 7.5 MG capsule Take 1 capsule (7.5 mg total) by mouth at bedtime as needed for sleep. Patient not taking: Reported on 06/02/2022 03/20/22   Volanda Napoleon, MD     Critical care time: 45 min

## 2022-06-03 NOTE — Progress Notes (Signed)
Set pt up for dinner, showed pt how to use call bell again and to call when in need of something. Family called and notified of pt's hgb results and plan to transfuse again. No concerns voiced.

## 2022-06-03 NOTE — Progress Notes (Signed)
Initial Nutrition Assessment  DOCUMENTATION CODES:   Non-severe (moderate) malnutrition in context of social or environmental circumstances  INTERVENTION:   Ensure Enlive po TID, each supplement provides 350 kcal and 20 grams of protein.  MVI with minerals daily.  NUTRITION DIAGNOSIS:   Moderate Malnutrition related to social / environmental circumstances (broken dentures causing poor intake) as evidenced by mild muscle depletion, mild fat depletion, percent weight loss (19% weight loss within 6 months).  GOAL:   Patient will meet greater than or equal to 90% of their needs  MONITOR:   PO intake, Supplement acceptance, Diet advancement  REASON FOR ASSESSMENT:   Malnutrition Screening Tool    ASSESSMENT:   71 yo female admitted with hematochezia, suspected diverticular bleeding, ABLA. PMH includes fibromyalgia, GERD, HTN, asthma, anxiety, DM, HLD.  Discussed patient in ICU rounds and with RN today. Currently on full liquids. She states that she is hungry and thirsty. Request for something to drink relayed to RN. Patient was eating poorly at home d/t broken dentures, but she received new dentures and has been eating well for the past few weeks and has gained ~5-6 lbs per daughter. Patient agreed to drink PO supplements between meals to help maximize intake of protein and calories.   Weight history reviewed. Usual weight 65.8 kg on 01/05/22. Down to 50.8 kg at the lowest on 04/26/22, last weight 53.3 kg on 05/23/22. Overall, 19% weight loss within the past 4.5 months is severe. No new weight available this admission.  Patient meets criteria for moderate malnutrition, given mild depletion of muscle and subcutaneous fat mass with 19% weight loss within 6 months.   Labs reviewed.  CBG: 178-187  Medications reviewed and include Protonix. IVF: LR at 100 ml/h.  NUTRITION - FOCUSED PHYSICAL EXAM:  Flowsheet Row Most Recent Value  Orbital Region No depletion  Upper Arm Region Mild  depletion  Thoracic and Lumbar Region Mild depletion  Buccal Region Mild depletion  Temple Region Mild depletion  Clavicle Bone Region Mild depletion  Clavicle and Acromion Bone Region Mild depletion  Scapular Bone Region Mild depletion  Dorsal Hand Mild depletion  Patellar Region Moderate depletion  Anterior Thigh Region Moderate depletion  Posterior Calf Region Moderate depletion  Edema (RD Assessment) None  Hair Reviewed  Eyes Reviewed  Mouth Reviewed  Skin Reviewed  Nails Reviewed       Diet Order:   Diet Order             Diet full liquid Room service appropriate? Yes; Fluid consistency: Thin  Diet effective now                   EDUCATION NEEDS:   No education needs have been identified at this time  Skin:  Skin Assessment: Reviewed RN Assessment  Last BM:  8/7 type 7  Height:   Ht Readings from Last 1 Encounters:  05/23/22 '5\' 5"'$  (1.651 m)    Weight:   Wt Readings from Last 1 Encounters:  05/23/22 53.3 kg    BMI: 19.55  Estimated Nutritional Needs:   Kcal:  1650-1850  Protein:  75-85 gm  Fluid:  1.7-1.9 L   Lucas Mallow RD, LDN, CNSC Please refer to Amion for contact information.

## 2022-06-03 NOTE — Progress Notes (Addendum)
       CROSS COVER NOTE  NAME: [name] MRN: [id number] DOB : 1951-05-22    Date of Service   06/03/2022  HPI/Events of Note   Notified by nursing that radiology is unable to complete ordered CTA Abdomen due to GFR 23.  Discussed with on call radiologist Dr Christa See regarding alternatives, at this time identifying the location of the bleed in a timely fashion is priority.  Benefits outweigh risk at this time will move forward with CT GI Bleed  Interventions   Plan: CT GI Bleed IV Hydration- patient received 1L bolus tonight and we will continue IVF at 50m/hr      This document was prepared using Dragon voice recognition software and may include unintentional dictation errors.  KNeomia GlassDNP, MHA, FNP-BC Nurse Practitioner [facility] CDisc[facility] Center LLCPager ([phone]

## 2022-06-04 ENCOUNTER — Inpatient Hospital Stay: Payer: Medicare PPO

## 2022-06-04 ENCOUNTER — Inpatient Hospital Stay: Payer: Medicare PPO | Admitting: Hematology & Oncology

## 2022-06-04 DIAGNOSIS — K922 Gastrointestinal hemorrhage, unspecified: Secondary | ICD-10-CM | POA: Diagnosis not present

## 2022-06-04 LAB — CBC
HCT: 21.8 % — ABNORMAL LOW (ref 36.0–46.0)
HCT: 23.2 % — ABNORMAL LOW (ref 36.0–46.0)
Hemoglobin: 7.5 g/dL — ABNORMAL LOW (ref 12.0–15.0)
Hemoglobin: 8.2 g/dL — ABNORMAL LOW (ref 12.0–15.0)
MCH: 28 pg (ref 26.0–34.0)
MCH: 29.5 pg (ref 26.0–34.0)
MCHC: 34.4 g/dL (ref 30.0–36.0)
MCHC: 35.3 g/dL (ref 30.0–36.0)
MCV: 81.3 fL (ref 80.0–100.0)
MCV: 83.5 fL (ref 80.0–100.0)
Platelets: 107 10*3/uL — ABNORMAL LOW (ref 150–400)
Platelets: 103 10*3/uL — ABNORMAL LOW (ref 150–400)
RBC: 2.68 MIL/uL — ABNORMAL LOW (ref 3.87–5.11)
RBC: 2.78 MIL/uL — ABNORMAL LOW (ref 3.87–5.11)
RDW: 17.4 % — ABNORMAL HIGH (ref 11.5–15.5)
RDW: 17.1 % — ABNORMAL HIGH (ref 11.5–15.5)
WBC: 11.4 10*3/uL — ABNORMAL HIGH (ref 4.0–10.5)
WBC: 7.9 10*3/uL (ref 4.0–10.5)
nRBC: 0 % (ref 0.0–0.2)
nRBC: 0 % (ref 0.0–0.2)

## 2022-06-04 LAB — COMPREHENSIVE METABOLIC PANEL
ALT: 10 U/L (ref 0–44)
AST: 13 U/L — ABNORMAL LOW (ref 15–41)
Albumin: 1.8 g/dL — ABNORMAL LOW (ref 3.5–5.0)
Alkaline Phosphatase: 28 U/L — ABNORMAL LOW (ref 38–126)
Anion gap: 3 — ABNORMAL LOW (ref 5–15)
BUN: 30 mg/dL — ABNORMAL HIGH (ref 8–23)
CO2: 21 mmol/L — ABNORMAL LOW (ref 22–32)
Calcium: 7.5 mg/dL — ABNORMAL LOW (ref 8.9–10.3)
Chloride: 114 mmol/L — ABNORMAL HIGH (ref 98–111)
Creatinine, Ser: 1.83 mg/dL — ABNORMAL HIGH (ref 0.44–1.00)
GFR, Estimated: 29 mL/min — ABNORMAL LOW (ref >60)
Glucose, Bld: 188 mg/dL — ABNORMAL HIGH (ref 70–99)
Potassium: 3.9 mmol/L (ref 3.5–5.1)
Sodium: 138 mmol/L (ref 135–145)
Total Bilirubin: 1.5 mg/dL — ABNORMAL HIGH (ref 0.3–1.2)
Total Protein: 3.2 g/dL — ABNORMAL LOW (ref 6.5–8.1)

## 2022-06-04 LAB — PREPARE RBC (CROSSMATCH)

## 2022-06-04 LAB — GLUCOSE, CAPILLARY
Glucose-Capillary: 106 mg/dL — ABNORMAL HIGH (ref 70–99)
Glucose-Capillary: 158 mg/dL — ABNORMAL HIGH (ref 70–99)
Glucose-Capillary: 186 mg/dL — ABNORMAL HIGH (ref 70–99)
Glucose-Capillary: 189 mg/dL — ABNORMAL HIGH (ref 70–99)
Glucose-Capillary: 223 mg/dL — ABNORMAL HIGH (ref 70–99)
Glucose-Capillary: 82 mg/dL (ref 70–99)

## 2022-06-04 LAB — HEMOGLOBIN AND HEMATOCRIT, BLOOD
HCT: 24.3 % — ABNORMAL LOW (ref 36.0–46.0)
Hemoglobin: 8.6 g/dL — ABNORMAL LOW (ref 12.0–15.0)

## 2022-06-04 MED ORDER — INSULIN ASPART 100 UNIT/ML IJ SOLN
0.0000 [IU] | Freq: Three times a day (TID) | INTRAMUSCULAR | Status: DC
  Administered 2022-06-04: 2 [IU] via SUBCUTANEOUS
  Administered 2022-06-04: 3 [IU] via SUBCUTANEOUS
  Administered 2022-06-05: 2 [IU] via SUBCUTANEOUS
  Administered 2022-06-05: 1 [IU] via SUBCUTANEOUS
  Administered 2022-06-06: 2 [IU] via SUBCUTANEOUS
  Administered 2022-06-07: 3 [IU] via SUBCUTANEOUS
  Administered 2022-06-07: 1 [IU] via SUBCUTANEOUS
  Administered 2022-06-08: 3 [IU] via SUBCUTANEOUS
  Administered 2022-06-08: 1 [IU] via SUBCUTANEOUS
  Administered 2022-06-09: 3 [IU] via SUBCUTANEOUS

## 2022-06-04 MED ORDER — GLUCERNA SHAKE PO LIQD
237.0000 mL | Freq: Three times a day (TID) | ORAL | Status: DC
  Administered 2022-06-07 – 2022-06-08 (×2): 237 mL via ORAL

## 2022-06-04 MED ORDER — TEMAZEPAM 7.5 MG PO CAPS
7.5000 mg | ORAL_CAPSULE | Freq: Once | ORAL | Status: AC
  Administered 2022-06-04: 7.5 mg via ORAL
  Filled 2022-06-04: qty 1

## 2022-06-04 MED ORDER — INSULIN ASPART 100 UNIT/ML IJ SOLN
0.0000 [IU] | Freq: Every day | INTRAMUSCULAR | Status: DC
  Administered 2022-06-06: 3 [IU] via SUBCUTANEOUS
  Administered 2022-06-07: 2 [IU] via SUBCUTANEOUS

## 2022-06-04 MED ORDER — SODIUM CHLORIDE 0.9% IV SOLUTION: Freq: Once | INTRAVENOUS | Status: DC

## 2022-06-04 NOTE — Progress Notes (Signed)
eLink Physician-Brief Progress Note Patient Name: [name] DOB: [birth year] MRN: [id number]   Date of Service  06/04/2022  HPI/Events of Note  Patient asking for an anxiolytic / sleep aid, states she takes Xanax at home but it is not on the list of her home medications, she does take 7.5 mg of Restoril PRN insomnia at home.  eICU Interventions  Restoril 7.5 mg po x 1 ordered.        Frederik Pear 06/04/2022, 9:38 PM

## 2022-06-04 NOTE — Progress Notes (Signed)
Subjective: Several episodes maroon stools with clots last night. No further bleeding since last night. No abdominal pain.  Objective: Vital signs in last 24 hours: Temp:  [98 F (36.7 C)-98.4 F (36.9 C)] 98.4 F (36.9 C) (08/08 0749) Pulse Rate:  [72-107] 72 (08/08 1000) Resp:  [0-22] 0 (08/08 1000) BP: (92-160)/(46-117) 112/61 (08/08 1000) SpO2:  [95 %-100 %] 99 % (08/08 1000) Weight change:  Last BM Date : 06/03/22  PE: GEN:  NAD ABD:  Soft, non-tender NEURO:  Non-focal, A/O, no encephalopathy  Lab Results: CBC    Component Value Date/Time   WBC 11.4 (H) 06/04/2022 0113   RBC 2.68 (L) 06/04/2022 0113   HGB 8.6 (L) 06/04/2022 0923   HGB 10.9 (L) 04/26/2022 0952   HCT 24.3 (L) 06/04/2022 0923   PLT 107 (L) 06/04/2022 0113   PLT 450 (H) 04/26/2022 0952   MCV 81.3 06/04/2022 0113   MCH 28.0 06/04/2022 0113   MCHC 34.4 06/04/2022 0113   RDW 17.4 (H) 06/04/2022 0113   LYMPHSABS 3.6 06/03/2022 1128   MONOABS 0.8 06/03/2022 1128   EOSABS 0.1 06/03/2022 1128   BASOSABS 0.1 06/03/2022 1128   Assessment:   Hematochezia.  Most likely diverticular bleeding. Acute blood loss anemia. Pulmonary embolism. Chronic anticoagulation, apixaban, for #3 above, on hold.  Plan:   Volume replete, transfuse as needed. Follow CBCs. If recurrent rampant bleeding, would either call IR for direct angiogram or repeat tagged RBC nuclear medicine study. [facility] will follow.   Landry Dyke 06/04/2022, 12:06 PM   Cell [phone] If no answer or after 5 PM call [phone]

## 2022-06-04 NOTE — Progress Notes (Signed)
NAME:  [name], MRN:  [id number], DOB:  [birth year], LOS: 2 ADMISSION DATE:  06/02/2022, CONSULTATION DATE:  06/03/22 REFERRING MD:  Otilio Miu, NP , CHIEF COMPLAINT:  lower gi bleeding   History of Present Illness:  71 year old woman with with recent admission 12/2021 for V.Fib arrest with saddle PE s/p thrombectomy, started on Eliquis, and then subsequent admission 4/29 with lower GI bleed secondary to hemorrhoids.   Presents to ED 8/6 with reported rectal bleeding. 8/5 was more tired then normal and weak. Per patient she got up and went to bathroom this morning and had a gush of dark blood per rectum which happened 3-4 additional times. On arrival to ER patient had an additional episode of large volume dark red blood with noted blood clots. GI consulted. Hemoglobin 8.9. Given 1 unit RBC. BP 112/90. HR 90. Amitted to 5N.   Overnight continued to be somewhat hypotensive, received 2 u PRBC.  Hb came up from 4.5 to 7, But had another episode of 300cc of melena.   BP remained low 41U-38G systolic.   CTA this morning did not reveal source of bleeding.    PCCM consulted given ongoing bleeding and concern for hemodynamic stability.  NM scan: 1752:  IMPRESSION: 1. Faint area of increased radiotracer activity localizing to the right lower quadrant during the second hour of imaging, without evidence of propagation to suggest peristalsis. Findings are equivocal for active gastrointestinal bleeding in the region of the distal small bowel or cecum. Pertinent  Medical History   V.Fib Arrest secondary to saddle PE (12/2021) on Eliquis, ITP, anxiety/depression, osteoarthritis, fibromyalgia, asthma, Type 2 DM, GERD, HLD, HTN, admission 02/23/2022 with lower GI bleed secondary to hemorrhoids   Significant Hospital Events: Including procedures, antibiotic start and stop dates in addition to other pertinent events   8/7 Transferred to ICU, s/p 3u pRBC 8/8 723m bloody stool overnight, another 3u pRBC, now s/p  total 6u pRBC  Interim History / Subjective:    Objective   Blood pressure 117/66, pulse 84, temperature 98.4 F (36.9 C), temperature source Oral, resp. rate 13, SpO2 98 %.        Intake/Output Summary (Last 24 hours) at 06/04/2022 0912 Last data filed at 06/04/2022 0800 Gross per 24 hour  Intake 2913.61 ml  Output 1300 ml  Net 1613.61 ml   Examination: General: A&O x3, NAD, conversationally appropriate HEENT: No pallor, MMM CV: RRR, 2/6 holosystolic murmur appreciated Pulm: CTAB, normal WOB Abdomen: Soft, nontender, nondistended, normoactive BS Extremities: Warm, dry, no edema present  Resolved Hospital Problem list     Assessment & Plan:  GI bleed concern for diverticular bleed  Acute blood loss anemia EGD and colonoscopy 01/2022 with diverticulosis and hemorrhoids, mild gastritis. Hb dropped into the 4s overnight. Transferred to ICU. AM CBC w/ Hgb 7.5. Now s/p 6u pRBC. Post H&H 8.6 -GI following, follow hemodynamically and serial CBCs and transfuse as needed, consider repeat tagged RBC study -NM GI scan snotes active GI bleed in region of distal small bowel or cecum. CTA unable to identify bleed.  -Continue PPI BID -Continue to hold all anticoagulation  -CBC q12h   Hemorrhagic Shock  H/O V.Fib Arrest secondary to saddle pulmonary embolism on Eliquis. Received Kcentra in ED. H/o HTN. Blood pressure stable -Maintain MAP >65  -Hold home antihypertensives  Acute on CKD stage IV  Crt 1.49 at discharge 01/2022. Crt on admit 2.19 BUN 40 -Trend BMP, Crt stable at 1.83 -Replace electrolytes as indicated  Type 2 DM -Trend Glucose, no SSI currently -Full liquid diet   Chronic Pain/Fibromyalgia  -on home Cymbalta, paroxetine, temazepam, melatonin  Diet/type: full liquids  DVT prophylaxis: not indicated GI prophylaxis: PPI Lines: N/A PIV x2 Foley:  N/A Code Status:  full code Last date of multidisciplinary goals of care discussion '[]'$   Labs   CBC: Recent Labs  Lab  06/02/22 1100 06/02/22 1812 06/03/22 0341 06/03/22 1128 06/03/22 1648 06/04/22 0113  WBC 7.3 6.0 10.2 10.8* 10.4 11.4*  NEUTROABS 3.6  --   --  6.2  --   --   HGB 8.9* 4.5* 7.4* 8.6* 6.9* 7.5*  HCT 28.2* 15.7* 21.6* 25.0* 19.8* 21.8*  MCV 86.8 94.0 87.4 84.5 84.3 81.3  PLT 296 170 176 154 140* 856*   Basic Metabolic Panel: Recent Labs  Lab 06/02/22 1100 06/03/22 0341 06/04/22 0113  NA 140 142 138  K 4.3 4.3 3.9  CL 110 116* 114*  CO2 22 17* 21*  GLUCOSE 293* 201* 188*  BUN 40* 34* 30*  CREATININE 2.19* 1.79* 1.83*  CALCIUM 8.3* 7.8* 7.5*   GFR: Estimated Creatinine Clearance: 23 mL/min (A) (by C-G formula based on SCr of 1.83 mg/dL (H)). Recent Labs  Lab 06/03/22 0341 06/03/22 1128 06/03/22 1648 06/04/22 0113  WBC 10.2 10.8* 10.4 11.4*   Liver Function Tests: Recent Labs  Lab 06/02/22 1100 06/04/22 0113  AST 20 13*  ALT 15 10  ALKPHOS 58 28*  BILITOT 0.4 1.5*  PROT 5.6* 3.2*  ALBUMIN 2.7* 1.8*   No results for input(s): "LIPASE", "AMYLASE" in the last 168 hours. No results for input(s): "AMMONIA" in the last 168 hours.  ABG    Component Value Date/Time   PHART 7.457 (H) 11/08/2021 1335   PCO2ART 32.5 11/08/2021 1335   PO2ART 83 11/08/2021 1335   HCO3 13.3 (L) 11/10/2021 2239   TCO2 14 (L) 11/10/2021 2239   ACIDBASEDEF 12.0 (H) 11/10/2021 2239   O2SAT 64.0 11/10/2021 2239     Coagulation Profile: Recent Labs  Lab 06/02/22 1100 06/03/22 0341 06/03/22 1128  INR 1.3* 1.3* 1.2   Cardiac Enzymes: No results for input(s): "CKTOTAL", "CKMB", "CKMBINDEX", "TROPONINI" in the last 168 hours.  HbA1C: Hgb A1c MFr Bld  Date/Time Value Ref Range Status  01/09/2022 06:54 PM 6.3 (H) 4.8 - 5.6 % Final    Comment:    (NOTE)         Prediabetes: 5.7 - 6.4         Diabetes: >6.4         Glycemic control for adults with diabetes: <7.0   10/28/2021 03:54 PM 9.0 (H) 4.8 - 5.6 % Final    Comment:    (NOTE) Pre diabetes:          5.7%-6.4%  Diabetes:               >6.4%  Glycemic control for   <7.0% adults with diabetes     CBG: Recent Labs  Lab 06/03/22 0705 06/03/22 1128 06/04/22 0751  GLUCAP 178* 187* 189*   Review of Systems:    Past Medical History:  She,  has a past medical history of Anxiety, Arthritis, Asthma, Cardiac arrest ([place]) (10/28/2021), Depression, Diabetes mellitus without complication ([place]), Fibromyalgia, GERD (gastroesophageal reflux disease), Hyperlipemia, Hypertension, and PONV (postoperative nausea and vomiting).   Surgical History:   Past Surgical History:  Procedure Laterality Date   COLONOSCOPY     COLONOSCOPY WITH PROPOFOL N/A 02/20/2022   Procedure: COLONOSCOPY WITH  PROPOFOL;  Surgeon: Otis Brace, MD;  Location: [facility] ENDOSCOPY;  Service: Gastroenterology;  Laterality: N/A;   DILATION AND CURETTAGE OF UTERUS     ESOPHAGOGASTRODUODENOSCOPY N/A 02/20/2022   Procedure: ESOPHAGOGASTRODUODENOSCOPY (EGD);  Surgeon: Otis Brace, MD;  Location: [facility] ENDOSCOPY;  Service: Gastroenterology;  Laterality: N/A;   IR EMBO ART  VEN HEMORR LYMPH EXTRAV  INC GUIDE ROADMAPPING  11/01/2021   IR FLUORO GUIDE CV LINE RIGHT  11/20/2021   IR REMOVAL TUN CV CATH W/O FL  12/20/2021   IR THROMBECT PRIM MECH INIT (INCLU) MOD SED  10/29/2021   IR US GUIDE VASC ACCESS RIGHT  10/29/2021   IR US GUIDE VASC ACCESS RIGHT  11/01/2021   IR US GUIDE VASC ACCESS RIGHT  11/21/2021   KNEE ARTHROSCOPY  [phone]   left   POLYPECTOMY  02/20/2022   Procedure: POLYPECTOMY;  Surgeon: Otis Brace, MD;  Location: [place];  Service: Gastroenterology;;   SHOULDER ARTHROSCOPY     left   TRIGGER FINGER RELEASE  08/13/2012   Procedure: RELEASE TRIGGER FINGER/A-1 PULLEY;  Surgeon: [name]., MD;  Location: [place];  Service: Orthopedics;  Laterality: Right;  long finger   TUBAL LIGATION       Social History:   reports that she quit smoking about 11 years ago. Her smoking use included cigarettes. She has a  10.00 pack-year smoking history. She has never used smokeless tobacco. She reports that she does not drink alcohol and does not use drugs.   Family History:  Her family history includes Diabetes in her mother and sister; Heart attack in her father; Hypertension in her daughter; Throat cancer in her brother.   Allergies Allergies  Allergen Reactions   Darvon [Propoxyphene] Itching   Hydrocodone Bit-[place] Mbr Itching   Metformin Nausea And Vomiting   Pentazocine Other (See Comments)      jitters   Percocet [Oxycodone-Acetaminophen] Itching   Sulfa Antibiotics Hives   Tetracaine Hcl Itching   Tetracyclines & Related Hives   Tramadol Itching   Benadryl [Diphenhydramine] Itching and Other (See Comments)    Can only take dye free. States the dye causes itching.   Other     Other reaction(s): Unknown. States reaction was to nasal spray that caused pupils to shrink   Aspirin Nausea And Vomiting     Home Medications  Prior to Admission medications   Medication Sig Start Date End Date Taking? Authorizing Provider  acetaminophen (TYLENOL) 325 MG tablet Take 2 tablets (650 mg total) by mouth every 4 (four) hours as needed for fever (greater than 37.6 C). Patient taking differently: Take 650 mg by mouth daily as needed (pain). 01/07/22  Yes Angiulli, Lavon Paganini, PA-C  albuterol (VENTOLIN HFA) 108 (90 Base) MCG/ACT inhaler Inhale 1-2 puffs into the lungs every 4 (four) hours as needed for wheezing or shortness of breath. Patient taking differently: Inhale 2 puffs into the lungs daily as needed for wheezing or shortness of breath. 02/14/22  Yes Medina-Vargas, Monina C, NP  amLODipine (NORVASC) 10 MG tablet Take 10 mg by mouth daily. 03/06/22  Yes [provider]  apixaban (ELIQUIS) 5 MG TABS tablet Take 1 tablet (5 mg total) by mouth 2 (two) times daily. 02/14/22  Yes Medina-Vargas, Monina C, NP  atorvastatin (LIPITOR) 20 MG tablet Take 1 tablet (20 mg total) by mouth daily. 02/14/22  Yes  Medina-Vargas, Monina C, NP  B Complex-C (B-COMPLEX WITH VITAMIN C) tablet Take 1 tablet by mouth daily. 01/08/22  Yes Angiulli, Lavon Paganini, PA-C  dicyclomine (BENTYL) 10 MG capsule Take 1 capsule (10 mg total) by mouth 3 (three) times daily before meals. Patient taking differently: Take 10 mg by mouth 2 (two) times daily as needed (stomach cramps). 02/14/22  Yes Medina-Vargas, Monina C, NP  Ferrous Sulfate (IRON PO) Take 1 tablet by mouth daily.   Yes [provider]  FLOVENT HFA 220 MCG/ACT inhaler Inhale 2 puffs into the lungs as needed (wheezing/SOB). 03/01/22  Yes [provider]  Glucerna (GLUCERNA) LIQD Take 237 mLs by mouth 2 (two) times daily between meals.   Yes [provider]  lidocaine (LIDODERM) 5 % Place 2 patches onto the skin daily. Remove & Discard patch within 12 hours or as directed by MD Patient taking differently: Place 1 patch onto the skin daily as needed (pain). Remove & Discard patch within 12 hours or as directed by MD 02/14/22  Yes Medina-Vargas, Monina C, NP  ondansetron (ZOFRAN) 4 MG tablet Take 4 mg by mouth as needed for nausea or vomiting. 06/01/22  Yes [provider]  oxyCODONE (OXY IR/ROXICODONE) 5 MG immediate release tablet Take 5 mg by mouth daily as needed for severe pain.   Yes [provider]  pantoprazole (PROTONIX) 40 MG tablet Take 1 tablet (40 mg total) by mouth 2 (two) times daily. 02/14/22  Yes Medina-Vargas, Monina C, NP  simvastatin (ZOCOR) 40 MG tablet Take 40 mg by mouth daily.   Yes [provider]  Vitamin D, Ergocalciferol, (DRISDOL) 1.25 MG (50000 UNIT) CAPS capsule Take 1 capsule (50,000 Units total) by mouth once a week. 01/07/22  Yes Angiulli, Lavon Paganini, PA-C  DULoxetine (CYMBALTA) 20 MG capsule Take 1 capsule (20 mg total) by mouth daily. Patient not taking: Reported on 06/02/2022 02/14/22   Medina-Vargas, Monina C, NP  folic acid (FOLVITE) 1 MG tablet Take 2 tablets (2 mg total) by mouth  daily. Patient not taking: Reported on 06/02/2022 02/14/22   Medina-Vargas, Monina C, NP  melatonin 5 MG TABS Take 1 tablet (5 mg total) by mouth every evening. Patient not taking: Reported on 06/02/2022 02/14/22   Medina-Vargas, Monina C, NP  PARoxetine (PAXIL) 40 MG tablet Take by mouth daily. Patient not taking: Reported on 06/02/2022 03/06/22   [provider]  temazepam (RESTORIL) 7.5 MG capsule Take 1 capsule (7.5 mg total) by mouth at bedtime as needed for sleep. Patient not taking: Reported on 06/02/2022 03/20/22   Volanda Napoleon, MD     Critical care time: 59 min    Wells Guiles, DO 06/04/2022, 9:12 AM PGY-2, [place] for pager If no response to pager, please call PCCM consult pager After 7:00 pm call Elink

## 2022-06-04 NOTE — TOC Initial Note (Signed)
Transition of Care [facility]) - Initial/Assessment Note    Patient Details  Name: [name] MRN: [id number] Date of Birth: 1951/07/20  Transition of [facility] CM/SW Contact:    Tom-Johnson, Renea Ee, RN Phone Number: 06/04/2022, 4:35 PM  Clinical Narrative:                  CM spoke with patient at bedside about needs for post hospital transition. Admitted for Lower GI Bleed. Hgb on admission was 8.9 then dropped to 4.5. Total of 6 units PRBC has been given this admission. Hgb today is 8.6. GI following. Patient had a recent admit for V.Fib Arrest 2/2 Saddle PE on 12/2021, started on Eliquis. From home with daughter, has two children. Independent with care and drive self prior to admission. Has a cane, walker, w/c and shower seat at home.  PCP is [name], MD and uses [facility] on [address].  No TOC needs or recommendations noted at this time. CM will continue to follow with needs as patient progresses with care.     Barriers to Discharge: Continued Medical Work up   Patient Goals and CMS Choice Patient states their goals for this hospitalization and ongoing recovery are:: To return home CMS Medicare.gov Compare Post Acute Care list provided to:: Patient    Expected Discharge Plan and Services     Discharge Planning Services: CM Consult   Living arrangements for the past 2 months: Apartment                                      Prior Living Arrangements/Services Living arrangements for the past 2 months: Apartment Lives with:: Adult Children (Daughter) Patient language and need for interpreter reviewed:: Yes Do you feel safe going back to the place where you live?: Yes      Need for Family Participation in Patient Care: Yes (Comment) Care giver support system in place?: Yes (comment) Current home services: DME (Cane, walker, w/c, shower seat.) Criminal Activity/Legal Involvement Pertinent to Current Situation/Hospitalization: No - Comment as  needed  Activities of Daily Living      Permission Sought/Granted Permission sought to share information with : Case Manager, Family Supports Permission granted to share information with : Yes, Verbal Permission Granted              Emotional Assessment Appearance:: Appears stated age Attitude/Demeanor/Rapport: Engaged, Gracious Affect (typically observed): Accepting, Appropriate, Calm, Hopeful Orientation: : Oriented to Self, Oriented to Place, Oriented to  Time, Oriented to Situation Alcohol / Substance Use: Not Applicable Psych Involvement: No (comment)  Admission diagnosis:  Acute lower GI bleeding [K92.2] Acute GI bleeding [K92.2] Patient Active Problem List   Diagnosis Date Noted   Acute lower GI bleeding 06/02/2022   GI bleed 02/18/2022   Bacteremia due to Enterococcus 02/15/2022   Goals of care, counseling/discussion 02/15/2022   Pain due to onychomycosis of toenails of both feet 02/06/2022   Adult failure to thrive syndrome 01/22/2022   History of anemia due to chronic kidney disease 01/17/2022   Neurocognitive deficits 01/17/2022   Pressure injury of skin 01/11/2022   History of pulmonary embolism 01/10/2022   Hypoglycemia 01/09/2022   CKD (chronic kidney disease) stage 4, GFR 15-29 ml/min (HCC) 01/09/2022   Syncope 01/09/2022   Essential hypertension    Debility 12/04/2021   Anoxia of brain ([place]) 12/04/2021   Acute on chronic renal failure ([place]) 11/12/2021  Leukocytosis    Thrombocytopenia (HCC)    Aspiration pneumonia of both lower lobes (HCC)    Hemodialysis-associated hypotension    Acute respiratory failure ([place]) due to recurrent aspiration Pneumonia and mucous plugging/atelectasis     Acute saddle pulmonary embolism with acute cor pulmonale (HCC)    Protein-calorie malnutrition, severe 11/08/2021   Cardiac arrest ([place]) 10/28/2021   Adjustment disorder with depressed mood 04/10/2021   History of infectious disease 04/10/2021   Affective psychosis  ([place]) 11/21/2020   Allergic rhinitis due to pollen 11/21/2020   Anxiety 11/21/2020   Chronic kidney disease, stage 2 (mild) 11/21/2020   Diabetic renal disease ([place]) 11/21/2020   Diverticular disease of colon 11/21/2020   Eczema 11/21/2020   Gastroesophageal reflux disease 11/21/2020   Gout 11/21/2020   Joint pain 11/21/2020   Lymphedema 11/21/2020   Mild intermittent asthma 11/21/2020   Moderate major depression, single episode ([place]) 11/21/2020   Neuropathy 11/21/2020   Type 2 diabetes mellitus with other specified complication ([place]) 01/60/1093   Pure hypercholesterolemia 11/21/2020   Vitamin D deficiency 11/21/2020   Fibromyalgia 09/04/2017   Insomnia 09/04/2017   DDD (degenerative disc disease), cervical 09/04/2017   DDD (degenerative disc disease), lumbar 09/04/2017   Primary osteoarthritis of both hands 09/04/2017   Primary osteoarthritis of both knees 09/04/2017   History of rotator cuff tear 09/04/2017   Osteopenia of multiple sites 09/04/2017   PCP:  [name], MD Pharmacy:   [facility] [address] [place] [address] Phone: [phone] Fax: [phone]  [facility] [address] [address] Phone: [phone] Fax: [phone]     Social Determinants of Health (SDOH) Interventions    Readmission Risk Interventions    01/10/2022    9:45 AM 12/04/2021    4:26 PM 11/02/2021    2:45 PM  Readmission Risk Prevention Plan  Transportation Screening Complete Complete Complete  PCP or Specialist Appt within 3-5 Days Complete Not Complete   Not Complete comments  INPT rehab   [place] or [place] Complete Complete   Social Work Consult for [place] Planning/Counseling Complete Complete   Palliative Care Screening Complete Complete   Medication Review Press photographer) Complete Complete

## 2022-06-04 NOTE — Progress Notes (Signed)
PCCM Interval Progress Note  Requested by Elink via patient's RN, D[place] Ar, to contact GI regarding large volume passage of bloody output from rectum.  Contacted [facility] provider on call, Dr. Watt Climes, regarding patient's current clinical status.  Patient was previously seen by Dr. Paulita Fujita earlier in the day 8/7.    Informed him that patient had approximately 700 mL maroon/bloody output per rectum with large amount of blood/clots.  From a hemodynamic standpoint, patient was marginally more tachycardic than prior with stable BP.  No significant report of pain.  Hgb 6.8 at approximately 1700, 2U PRBC ordered/transfused with repeat post-transfusion Hgb pending.  Additionally, patient underwent NM tagged RBC study 8/6 with increased radiotracer activity in the RLQ; equivocal for active GI bleeding in the distal small bowel/cecum.  CTA GIB 8/7 was negative for active arterial extravasation or GI bleed source.  Given patient's current hemodynamic stability and endoscopic evaluation likely to be low yield, Dr. Watt Climes suggested if patient continues to have large volume bloody output per rectum, would repeat NM tagged RBC study or if more brisk bleeding would contact IR directly for possible intervention.  PCCM/ELink will continue to monitor.  Lestine Mount, PA-C [facility] 06/04/22 2:11 AM  Please see [email or web address] for pager details.  From 7A-7P if no response, please call [phone] After hours, please call ELink [phone]

## 2022-06-04 NOTE — Plan of Care (Signed)

## 2022-06-04 NOTE — Progress Notes (Signed)
Nutrition Follow-up Brief Note  Per discussion with RN, patient is requesting Glucerna Shake supplements instead of Ensure. She remains on a full liquid diet. RD to change supplement order and follow for adequacy of oral intake.  Lucas Mallow RD, LDN, CNSC Please refer to Amion for contact information.                                                      ,

## 2022-06-05 ENCOUNTER — Inpatient Hospital Stay (HOSPITAL_COMMUNITY): Payer: Medicare PPO

## 2022-06-05 ENCOUNTER — Ambulatory Visit: Payer: Medicare PPO | Admitting: Occupational Therapy

## 2022-06-05 ENCOUNTER — Ambulatory Visit: Payer: Medicare PPO | Admitting: Physical Therapy

## 2022-06-05 DIAGNOSIS — K922 Gastrointestinal hemorrhage, unspecified: Secondary | ICD-10-CM | POA: Diagnosis not present

## 2022-06-05 DIAGNOSIS — E440 Moderate protein-calorie malnutrition: Secondary | ICD-10-CM | POA: Insufficient documentation

## 2022-06-05 LAB — CBC
HCT: 29.3 % — ABNORMAL LOW (ref 36.0–46.0)
HCT: 28.9 % — ABNORMAL LOW (ref 36.0–46.0)
Hemoglobin: 10.2 g/dL — ABNORMAL LOW (ref 12.0–15.0)
Hemoglobin: 10.3 g/dL — ABNORMAL LOW (ref 12.0–15.0)
MCH: 30 pg (ref 26.0–34.0)
MCH: 30.3 pg (ref 26.0–34.0)
MCHC: 34.8 g/dL (ref 30.0–36.0)
MCHC: 35.6 g/dL (ref 30.0–36.0)
MCV: 86.2 fL (ref 80.0–100.0)
MCV: 85 fL (ref 80.0–100.0)
Platelets: 98 10*3/uL — ABNORMAL LOW (ref 150–400)
Platelets: 112 10*3/uL — ABNORMAL LOW (ref 150–400)
RBC: 3.4 MIL/uL — ABNORMAL LOW (ref 3.87–5.11)
RBC: 3.4 MIL/uL — ABNORMAL LOW (ref 3.87–5.11)
RDW: 15.4 % (ref 11.5–15.5)
RDW: 15.4 % (ref 11.5–15.5)
WBC: 9.8 10*3/uL (ref 4.0–10.5)
WBC: 8.8 10*3/uL (ref 4.0–10.5)
nRBC: 0 % (ref 0.0–0.2)
nRBC: 0 % (ref 0.0–0.2)

## 2022-06-05 LAB — BASIC METABOLIC PANEL
Anion gap: 4 — ABNORMAL LOW (ref 5–15)
BUN: 20 mg/dL (ref 8–23)
CO2: 23 mmol/L (ref 22–32)
Calcium: 7.9 mg/dL — ABNORMAL LOW (ref 8.9–10.3)
Chloride: 113 mmol/L — ABNORMAL HIGH (ref 98–111)
Creatinine, Ser: 1.6 mg/dL — ABNORMAL HIGH (ref 0.44–1.00)
GFR, Estimated: 34 mL/min — ABNORMAL LOW (ref >60)
Glucose, Bld: 191 mg/dL — ABNORMAL HIGH (ref 70–99)
Potassium: 3.7 mmol/L (ref 3.5–5.1)
Sodium: 140 mmol/L (ref 135–145)

## 2022-06-05 LAB — GLUCOSE, CAPILLARY
Glucose-Capillary: 118 mg/dL — ABNORMAL HIGH (ref 70–99)
Glucose-Capillary: 112 mg/dL — ABNORMAL HIGH (ref 70–99)
Glucose-Capillary: 176 mg/dL — ABNORMAL HIGH (ref 70–99)
Glucose-Capillary: 133 mg/dL — ABNORMAL HIGH (ref 70–99)
Glucose-Capillary: 93 mg/dL (ref 70–99)

## 2022-06-05 LAB — PREPARE RBC (CROSSMATCH)

## 2022-06-05 LAB — HEMOGLOBIN AND HEMATOCRIT, BLOOD
HCT: 19.8 % — ABNORMAL LOW (ref 36.0–46.0)
Hemoglobin: 6.9 g/dL — CL (ref 12.0–15.0)

## 2022-06-05 MED ORDER — SODIUM CHLORIDE 0.9% IV SOLUTION
Freq: Once | INTRAVENOUS | Status: AC
Start: 2022-06-05 — End: 2022-06-05

## 2022-06-05 MED ORDER — IOHEXOL 350 MG/ML SOLN
70.0000 mL | Freq: Once | INTRAVENOUS | Status: AC | PRN
  Administered 2022-06-05: 70 mL via INTRAVENOUS

## 2022-06-05 MED ORDER — TEMAZEPAM 7.5 MG PO CAPS
7.5000 mg | ORAL_CAPSULE | Freq: Every evening | ORAL | Status: DC | PRN
Start: 2022-06-05 — End: 2022-06-09
  Administered 2022-06-05 – 2022-06-08 (×4): 7.5 mg via ORAL
  Filled 2022-06-05 (×4): qty 1

## 2022-06-05 MED ORDER — BISACODYL 10 MG RE SUPP: 10.0000 mg | Freq: Once | RECTAL | Status: DC

## 2022-06-05 MED ORDER — PEG-KCL-NACL-NASULF-NA ASC-C 100 G PO SOLR
1.0000 | Freq: Once | ORAL | Status: AC
  Administered 2022-06-05: 200 g via ORAL
  Filled 2022-06-05: qty 1

## 2022-06-05 NOTE — Progress Notes (Signed)
Contacted Elink in regards to pt having a 200-319m bloody BM. RN was directed to contact [facility]. RN spoke with Dr OPaulita Fujita who saw the pt today, he said referring to his note "If recurrent rampant bleeding, would either call IR for direct angiogram or repeat tagged RBC nuclear medicine study"....  RN made Elink-RN aware to pass this information over to Elink-MD. Pt is hemodynamically stable.

## 2022-06-05 NOTE — Progress Notes (Signed)
Subjective: More bleeding last night; CT angiogram showed no active bleeding but blood accumulation in colon.  Objective: Vital signs in last 24 hours: Temp:  [97.6 F (36.4 C)-98.1 F (36.7 C)] 97.7 F (36.5 C) (08/09 1212) Pulse Rate:  [69-97] 77 (08/09 1411) Resp:  [0-20] 13 (08/09 1411) BP: (85-121)/(41-73) 121/58 (08/09 1411) SpO2:  [94 %-100 %] 98 % (08/09 1411) Weight change:  Last BM Date : 06/04/22  PE: GEN:  NAD ABD:  Soft, non-tender NEURO:  A/O, no encephalopathy  Lab Results:  CMP     Component Value Date/Time   NA 140 06/05/2022 0953   NA 139 01/23/2022 0000   K 3.7 06/05/2022 0953   CL 113 (H) 06/05/2022 0953   CO2 23 06/05/2022 0953   GLUCOSE 191 (H) 06/05/2022 0953   BUN 20 06/05/2022 0953   BUN 23 (A) 01/23/2022 0000   CREATININE 1.60 (H) 06/05/2022 0953   CREATININE 1.83 (H) 04/26/2022 0952   CREATININE 1.14 (H) 03/06/2017 1502   CALCIUM 7.9 (L) 06/05/2022 0953   PROT 3.2 (L) 06/04/2022 0113   ALBUMIN 1.8 (L) 06/04/2022 0113   AST 13 (L) 06/04/2022 0113   AST 16 04/26/2022 0952   ALT 10 06/04/2022 0113   ALT 11 04/26/2022 0952   ALKPHOS 28 (L) 06/04/2022 0113   BILITOT 1.5 (H) 06/04/2022 0113   BILITOT 0.4 04/26/2022 0952   GFRNONAA 34 (L) 06/05/2022 0953   GFRNONAA 29 (L) 04/26/2022 0952   GFRNONAA 50 (L) 03/06/2017 1502   GFRAA 51 (L) 04/11/2019 1121   GFRAA 57 (L) 03/06/2017 1502  CBC    Component Value Date/Time   WBC 9.8 06/05/2022 0953   RBC 3.40 (L) 06/05/2022 0953   HGB 10.2 (L) 06/05/2022 0953   HGB 10.9 (L) 04/26/2022 0952   HCT 29.3 (L) 06/05/2022 0953   PLT 98 (L) 06/05/2022 0953   PLT 450 (H) 04/26/2022 0952   MCV 86.2 06/05/2022 0953   MCH 30.0 06/05/2022 0953   MCHC 34.8 06/05/2022 0953   RDW 15.4 06/05/2022 0953   LYMPHSABS 3.6 06/03/2022 1128   MONOABS 0.8 06/03/2022 1128   EOSABS 0.1 06/03/2022 1128   BASOSABS 0.1 06/03/2022 1128   Assessment:   Hematochezia.  Most likely diverticular bleeding. Acute blood  loss anemia. Pulmonary embolism. Chronic anticoagulation, apixaban, for #3 above, on hold.  Plan:   Clear liquid diet. Colonoscopy tomorrow. Risks (bleeding, infection, bowel perforation that could require surgery, sedation-related changes in cardiopulmonary systems), benefits (identification and possible treatment of source of symptoms, exclusion of certain causes of symptoms), and alternatives (watchful waiting, radiographic imaging studies, empiric medical treatment) of colonoscopy were explained to patient/family in detail and patient wishes to proceed.  Next step management pending colonoscopy findings.  I did review at length with patient that we believe her bleeding is from colonic diverticulosis, but that identifying exact site of bleeding, even with colonoscopy, is often not possible.   [name] 06/05/2022, 2:20 PM   Cell [phone] If no answer or after 5 PM call [phone]

## 2022-06-05 NOTE — Progress Notes (Signed)
Patients daughter at patients door requesting RN to come in. When this RN entered the room patient was out of bed on bedside commode. This RN asked if the nurse tech or another staff member helped the patient out of bed. Daughter stated (no I got her out of bed because you weren't in here) This RN provided education on the risks of getting the patient out of bed without a staff member present and that this could result in a fall and how to use the call bell in the future for assistance. The daughter stated (I did this earlier, I don't need you to tell me what to do) and then requested that I (go get rags and clean her up) Upon reentering the room and assisting the patient to be cleaned with 3 wash clothes that I had wet with warm water from the sink I attempted to clean the blood from the patient but the patient was continuing to bleed small amounts. This RN proceeded to grab one of the other wash clothes nearby that was dry to wipe the blood. The patients daughter then stepped into the immediate space of this RN and vocalized loudly that I (was not to touch her mother with a dry wash cloth and that there better not be any blood on her) and that she (would be checking.) This RN then asked if the daughter would please wait outside of the patients room while I finished cleaning her up because I felt uncomfortable with her tone and her being in my personal space and she could come back once I was done. The daughter then stepped into the hall and stated again (if I come back in there and there is blood on her I swear to God) due to her increased volume this drew the attention of several other staff members and [place], RN then stepped into the room to offer a hand. Ro and this RN then finished cleaning the patient who is actively bleeding and receiving bowel prep for a colonoscopy in the morning. The daughter then stepped into the room and proceeded to grab a dry wash cloth and wipe the patient. There was a small amount of  blood and the daughter then increased her volume and stated (see that's not what we are going to do. You are going to do your job and clean her. You know what matter of fact you aren't going to touch her again. I want a new nurse.) This RN then left the room due to the pacing back and fourth of the daughter and the increase in her volume and let Ro, RN remain in the room. Another staff member contacted the charge nurse to inform her of the situation. Before arriving to the unit the daughter then left stating (I am going above all of yall's head. I know the medical director of the ICU). The daughter later called from the lobby and spoke with the charge nurse Deberah Castle, RN and requested a new nurse be put on her mothers care.

## 2022-06-05 NOTE — Anesthesia Preprocedure Evaluation (Signed)
Anesthesia Evaluation  Patient identified by MRN, date of birth, ID band Patient awake    Reviewed: Allergy & Precautions, NPO status , Patient's Chart, lab work & pertinent test results  Airway Mallampati: II  TM Distance: >3 FB Neck ROM: Full    Dental no notable dental hx. (+) Partial Lower, Partial Upper, Dental Advisory Given   Pulmonary asthma , former smoker, PE   Pulmonary exam normal breath sounds clear to auscultation       Cardiovascular hypertension, Normal cardiovascular exam Rhythm:Regular Rate:Normal  12/2021 echo 1. Left ventricular ejection fraction, by estimation, is 60 to 65%. The  left ventricle has normal function. The left ventricle has no regional  wall motion abnormalities. Left ventricular diastolic parameters are  consistent with Grade I diastolic  dysfunction (impaired relaxation).    Neuro/Psych    GI/Hepatic GERD  ,  Endo/Other  diabetes  Renal/GU      Musculoskeletal  (+) Arthritis , Fibromyalgia -  Abdominal   Peds  Hematology  (+) Blood dyscrasia, anemia , Lab Results      Component                Value               Date                      WBC                      8.8                 06/05/2022                HGB                      10.3 (L)            06/05/2022                HCT                      28.9 (L)            06/05/2022                MCV                      85.0                06/05/2022                PLT                      112 (L)             06/05/2022              Anesthesia Other Findings All: see list  Reproductive/Obstetrics                            Anesthesia Physical Anesthesia Plan  ASA: 3  Anesthesia Plan: MAC   Post-op Pain Management: Minimal or no pain anticipated   Induction:   PONV Risk Score and Plan: TIVA, Propofol infusion and Treatment may vary due to age or medical condition  Airway Management Planned:  Natural Airway and Nasal Cannula  Additional Equipment: None  Intra-op Plan:   Post-operative Plan:  Informed Consent: I have reviewed the patients History and Physical, chart, labs and discussed the procedure including the risks, benefits and alternatives for the proposed anesthesia with the patient or authorized representative who has indicated his/her understanding and acceptance.     Dental advisory given  Plan Discussed with: CRNA and Anesthesiologist  Anesthesia Plan Comments:        Anesthesia Quick Evaluation

## 2022-06-05 NOTE — Progress Notes (Signed)
eLink Physician-Brief Progress Note Patient Name: [name] DOB: 06/19/1951 MRN: [id number]   Date of Service  06/05/2022  HPI/Events of Note  Patient requests home Temazepam for sleep.  eICU Interventions  Plan: Temazepam 7.5 mg PO Q HS PRN sleep.     Intervention Category Major Interventions: Other:  Lysle Dingwall 06/05/2022, 10:44 PM

## 2022-06-05 NOTE — Progress Notes (Signed)
eLink Physician-Brief Progress Note Patient Name: [name] DOB: 10-15-1951 MRN: [id number]   Date of Service  06/05/2022  HPI/Events of Note  Patient had a 200-300 ml bloody bowel movement, BP unchanged for now.  eICU Interventions  Stat CBC ordered, creatinine 1.83, unfortunately nuclear medicine does not take call for tagged RBC scans, will monitor patient closely and consider CTA of abdomen / pelvis if further bleeding occurs or hemoglobin significantly lower, otherwise RBC tagged scan in the AM vs  colonoscopy by GI. Will confirm availability of blood in the bank for patient.        Lesley Galentine U Briann Sarchet 06/05/2022, 1:03 AM

## 2022-06-05 NOTE — Progress Notes (Signed)
NAME:  [name], MRN:  [id number], DOB:  06-05-1951, LOS: 3 ADMISSION DATE:  06/02/2022, CONSULTATION DATE:  06/03/22 REFERRING MD:  Otilio Miu, NP , CHIEF COMPLAINT:  lower gi bleeding   History of Present Illness:  71 year old woman with with recent admission 12/2021 for V.Fib arrest with saddle PE s/p thrombectomy, started on Eliquis, and then subsequent admission 4/29 with lower GI bleed secondary to hemorrhoids.   Presents to ED 8/6 with reported rectal bleeding. 8/5 was more tired then normal and weak. Per patient she got up and went to bathroom this morning and had a gush of dark blood per rectum which happened 3-4 additional times. On arrival to ER patient had an additional episode of large volume dark red blood with noted blood clots. GI consulted. Hemoglobin 8.9. Given 1 unit RBC. BP 112/90. HR 90. Amitted to 5N.   Overnight continued to be somewhat hypotensive, received 2 u PRBC.  Hb came up from 4.5 to 7, But had another episode of 300cc of melena.   BP remained low 56L-87F systolic.   CTA this morning did not reveal source of bleeding.    PCCM consulted given ongoing bleeding and concern for hemodynamic stability.  NM scan: 1752:  IMPRESSION: 1. Faint area of increased radiotracer activity localizing to the right lower quadrant during the second hour of imaging, without evidence of propagation to suggest peristalsis. Findings are equivocal for active gastrointestinal bleeding in the region of the distal small bowel or cecum. Pertinent  Medical History   V.Fib Arrest secondary to saddle PE (12/2021) on Eliquis, ITP, anxiety/depression, osteoarthritis, fibromyalgia, asthma, Type 2 DM, GERD, HLD, HTN, admission 02/23/2022 with lower GI bleed secondary to hemorrhoids   Significant Hospital Events: Including procedures, antibiotic start and stop dates in addition to other pertinent events   8/7 Transferred to ICU, s/p 3u pRBC 8/8 762m bloody stool overnight, another 3u pRBC, now s/p  total 6u pRBC 8/9 200-30107mblood stool overnight, another 2u pRBC ordered, now s/p total 8u pRBC  Interim History / Subjective:    Objective   Blood pressure 111/68, pulse 85, temperature 97.9 F (36.6 C), temperature source Oral, resp. rate 11, SpO2 97 %.        Intake/Output Summary (Last 24 hours) at 06/05/2022 0750 Last data filed at 06/05/2022 [id number] per 24 hour  Intake 2894.51 ml  Output 1350 ml  Net 1544.51 ml    Examination: General: A&O x 3, NAD, conversationally appropriate HEENT: No pallor, MMM CV: RRR, no murmurs auscultated Pulm: CTAB, normal WOB Abdomen: Soft, nontender, nondistended, normoactive BS Extremities: Warm, dry, no edema present  Resolved Hospital Problem list     Assessment & Plan:  GI bleed concern for diverticular bleed  Acute blood loss anemia EGD and colonoscopy 01/2022 with diverticulosis and hemorrhoids, mild gastritis. Hb dropped into the 4s and transferred to ICU on 8/6. Now s/p 8u pRBC. Post H&H pending -GI following, follow hemodynamically and serial CBCs and transfuse as needed, consider repeat tagged RBC study -NM GI scan snotes active GI bleed in region of distal small bowel or cecum. CTA unable to identify bleed.  -Case discussed with IR, would not embolize without direct source confirmation via CTA.  Will obtain CTA abdomen/pelvis -Continue PPI BID -Continue to hold all anticoagulation  -CBC q12h   Hemorrhagic Shock  H/O V.Fib Arrest secondary to saddle pulmonary embolism on Eliquis. Received Kcentra in ED. H/o HTN. Blood pressure stable -Maintain MAP >65  -Hold home antihypertensives  Acute on CKD stage IV  Crt 1.49 at discharge 01/2022. Crt on admit 2.19 BUN 40 -Trend BMP, Crt stable at 1.83 -Replace electrolytes as indicated    Type 2 DM -Trend Glucose, no SSI currently -Full liquid diet   Chronic Pain/Fibromyalgia  -on home Cymbalta, paroxetine, temazepam, melatonin  Diet/type: full liquids  DVT prophylaxis: not  indicated GI prophylaxis: PPI Lines: N/A PIV x2 Foley:  N/A Code Status:  full code Last date of multidisciplinary goals of care discussion '[]'$   Labs   CBC: Recent Labs  Lab 06/02/22 1100 06/02/22 1812 06/03/22 0341 06/03/22 1128 06/03/22 1648 06/04/22 0113 06/04/22 0923 06/04/22 1650 06/05/22 0114  WBC 7.3   < > 10.2 10.8* 10.4 11.4*  --  7.9  --   NEUTROABS 3.6  --   --  6.2  --   --   --   --   --   HGB 8.9*   < > 7.4* 8.6* 6.9* 7.5* 8.6* 8.2* 6.9*  HCT 28.2*   < > 21.6* 25.0* 19.8* 21.8* 24.3* 23.2* 19.8*  MCV 86.8   < > 87.4 84.5 84.3 81.3  --  83.5  --   PLT 296   < > 176 154 140* 107*  --  103*  --    < > = values in this interval not displayed.    Basic Metabolic Panel: Recent Labs  Lab 06/02/22 1100 06/03/22 0341 06/04/22 0113  NA 140 142 138  K 4.3 4.3 3.9  CL 110 116* 114*  CO2 22 17* 21*  GLUCOSE 293* 201* 188*  BUN 40* 34* 30*  CREATININE 2.19* 1.79* 1.83*  CALCIUM 8.3* 7.8* 7.5*    GFR: Estimated Creatinine Clearance: 23 mL/min (A) (by C-G formula based on SCr of 1.83 mg/dL (H)). Recent Labs  Lab 06/03/22 1128 06/03/22 1648 06/04/22 0113 06/04/22 1650  WBC 10.8* 10.4 11.4* 7.9    Liver Function Tests: Recent Labs  Lab 06/02/22 1100 06/04/22 0113  AST 20 13*  ALT 15 10  ALKPHOS 58 28*  BILITOT 0.4 1.5*  PROT 5.6* 3.2*  ALBUMIN 2.7* 1.8*    No results for input(s): "LIPASE", "AMYLASE" in the last 168 hours. No results for input(s): "AMMONIA" in the last 168 hours.  ABG    Component Value Date/Time   PHART 7.457 (H) 11/08/2021 1335   PCO2ART 32.5 11/08/2021 1335   PO2ART 83 11/08/2021 1335   HCO3 13.3 (L) 11/10/2021 2239   TCO2 14 (L) 11/10/2021 2239   ACIDBASEDEF 12.0 (H) 11/10/2021 2239   O2SAT 64.0 11/10/2021 2239     Coagulation Profile: Recent Labs  Lab 06/02/22 1100 06/03/22 0341 06/03/22 1128  INR 1.3* 1.3* 1.2    Cardiac Enzymes: No results for input(s): "CKTOTAL", "CKMB", "CKMBINDEX", "TROPONINI" in the  last 168 hours.  HbA1C: Hgb A1c MFr Bld  Date/Time Value Ref Range Status  01/09/2022 06:54 PM 6.3 (H) 4.8 - 5.6 % Final    Comment:    (NOTE)         Prediabetes: 5.7 - 6.4         Diabetes: >6.4         Glycemic control for adults with diabetes: <7.0   10/28/2021 03:54 PM 9.0 (H) 4.8 - 5.6 % Final    Comment:    (NOTE) Pre diabetes:          5.7%-6.4%  Diabetes:              >6.4%  Glycemic control  for   <7.0% adults with diabetes     CBG: Recent Labs  Lab 06/04/22 1937 06/04/22 2147 06/04/22 2347 06/05/22 0352 06/05/22 0741  GLUCAP 158* 82 106* 118* 112*    Review of Systems:    Past Medical History:  She,  has a past medical history of Anxiety, Arthritis, Asthma, Cardiac arrest ([place]) (10/28/2021), Depression, Diabetes mellitus without complication ([place]), Fibromyalgia, GERD (gastroesophageal reflux disease), Hyperlipemia, Hypertension, and PONV (postoperative nausea and vomiting).   Surgical History:   Past Surgical History:  Procedure Laterality Date   COLONOSCOPY     COLONOSCOPY WITH PROPOFOL N/A 02/20/2022   Procedure: COLONOSCOPY WITH PROPOFOL;  Surgeon: Otis Brace, MD;  Location: [place];  Service: Gastroenterology;  Laterality: N/A;   DILATION AND CURETTAGE OF UTERUS     ESOPHAGOGASTRODUODENOSCOPY N/A 02/20/2022   Procedure: ESOPHAGOGASTRODUODENOSCOPY (EGD);  Surgeon: Otis Brace, MD;  Location[facility] ENDOSCOPY;  Service: Gastroenterology;  Laterality: N/A;   IR EMBO ART  VEN HEMORR LYMPH EXTRAV  INC GUIDE ROADMAPPING  11/01/2021   IR FLUORO GUIDE CV LINE RIGHT  11/20/2021   IR REMOVAL TUN CV CATH W/O FL  12/20/2021   IR THROMBECT PRIM MECH INIT (INCLU) MOD SED  10/29/2021   IR US GUIDE VASC ACCESS RIGHT  10/29/2021   IR US GUIDE VASC ACCESS RIGHT  11/01/2021   IR US GUIDE VASC ACCESS RIGHT  11/21/2021   KNEE ARTHROSCOPY  [id number]   left   POLYPECTOMY  02/20/2022   Procedure: POLYPECTOMY;  Surgeon: Otis Brace, MD;  Location: [place];   Service: Gastroenterology;;   SHOULDER ARTHROSCOPY     left   TRIGGER FINGER RELEASE  08/13/2012   Procedure: RELEASE TRIGGER FINGER/A-1 PULLEY;  Surgeon: [name]., MD;  Location: [place];  Service: Orthopedics;  Laterality: Right;  long finger   TUBAL LIGATION       Social History:   reports that she quit smoking about 11 years ago. Her smoking use included cigarettes. She has a 10.00 pack-year smoking history. She has never used smokeless tobacco. She reports that she does not drink alcohol and does not use drugs.   Family History:  Her family history includes Diabetes in her mother and sister; Heart attack in her father; Hypertension in her daughter; Throat cancer in her brother.   Allergies Allergies  Allergen Reactions   Darvon [Propoxyphene] Itching   Hydrocodone Bit-[place] Mbr Itching   Metformin Nausea And Vomiting   Pentazocine Other (See Comments)      jitters   Percocet [Oxycodone-Acetaminophen] Itching   Sulfa Antibiotics Hives   Tetracaine Hcl Itching   Tetracyclines & Related Hives   Tramadol Itching   Benadryl [Diphenhydramine] Itching and Other (See Comments)    Can only take dye free. States the dye causes itching.   Other     Other reaction(s): Unknown. States reaction was to nasal spray that caused pupils to shrink   Aspirin Nausea And Vomiting     Home Medications  Prior to Admission medications   Medication Sig Start Date End Date Taking? Authorizing Provider  acetaminophen (TYLENOL) 325 MG tablet Take 2 tablets (650 mg total) by mouth every 4 (four) hours as needed for fever (greater than 37.6 C). Patient taking differently: Take 650 mg by mouth daily as needed (pain). 01/07/22  Yes Angiulli, Lavon Paganini, PA-C  albuterol (VENTOLIN HFA) 108 (90 Base) MCG/ACT inhaler Inhale 1-2 puffs into the lungs every 4 (four) hours as needed for wheezing or shortness  of breath. Patient taking differently: Inhale 2 puffs into the lungs daily as  needed for wheezing or shortness of breath. 02/14/22  Yes Medina-Vargas, Monina C, NP  amLODipine (NORVASC) 10 MG tablet Take 10 mg by mouth daily. 03/06/22  Yes [provider]  apixaban (ELIQUIS) 5 MG TABS tablet Take 1 tablet (5 mg total) by mouth 2 (two) times daily. 02/14/22  Yes Medina-Vargas, Monina C, NP  atorvastatin (LIPITOR) 20 MG tablet Take 1 tablet (20 mg total) by mouth daily. 02/14/22  Yes Medina-Vargas, Monina C, NP  B Complex-C (B-COMPLEX WITH VITAMIN C) tablet Take 1 tablet by mouth daily. 01/08/22  Yes Angiulli, Lavon Paganini, PA-C  dicyclomine (BENTYL) 10 MG capsule Take 1 capsule (10 mg total) by mouth 3 (three) times daily before meals. Patient taking differently: Take 10 mg by mouth 2 (two) times daily as needed (stomach cramps). 02/14/22  Yes Medina-Vargas, Monina C, NP  Ferrous Sulfate (IRON PO) Take 1 tablet by mouth daily.   Yes [provider]  FLOVENT HFA 220 MCG/ACT inhaler Inhale 2 puffs into the lungs as needed (wheezing/SOB). 03/01/22  Yes [provider]  Glucerna (GLUCERNA) LIQD Take 237 mLs by mouth 2 (two) times daily between meals.   Yes [provider]  lidocaine (LIDODERM) 5 % Place 2 patches onto the skin daily. Remove & Discard patch within 12 hours or as directed by MD Patient taking differently: Place 1 patch onto the skin daily as needed (pain). Remove & Discard patch within 12 hours or as directed by MD 02/14/22  Yes Medina-Vargas, Monina C, NP  ondansetron (ZOFRAN) 4 MG tablet Take 4 mg by mouth as needed for nausea or vomiting. 06/01/22  Yes [provider]  oxyCODONE (OXY IR/ROXICODONE) 5 MG immediate release tablet Take 5 mg by mouth daily as needed for severe pain.   Yes [provider]  pantoprazole (PROTONIX) 40 MG tablet Take 1 tablet (40 mg total) by mouth 2 (two) times daily. 02/14/22  Yes Medina-Vargas, Monina C, NP  simvastatin (ZOCOR) 40 MG tablet Take 40 mg by mouth daily.   Yes [provider]   Vitamin D, Ergocalciferol, (DRISDOL) 1.25 MG (50000 UNIT) CAPS capsule Take 1 capsule (50,000 Units total) by mouth once a week. 01/07/22  Yes Angiulli, Lavon Paganini, PA-C  DULoxetine (CYMBALTA) 20 MG capsule Take 1 capsule (20 mg total) by mouth daily. Patient not taking: Reported on 06/02/2022 02/14/22   Medina-Vargas, Monina C, NP  folic acid (FOLVITE) 1 MG tablet Take 2 tablets (2 mg total) by mouth daily. Patient not taking: Reported on 06/02/2022 02/14/22   Medina-Vargas, Monina C, NP  melatonin 5 MG TABS Take 1 tablet (5 mg total) by mouth every evening. Patient not taking: Reported on 06/02/2022 02/14/22   Medina-Vargas, Monina C, NP  PARoxetine (PAXIL) 40 MG tablet Take by mouth daily. Patient not taking: Reported on 06/02/2022 03/06/22   [provider]  temazepam (RESTORIL) 7.5 MG capsule Take 1 capsule (7.5 mg total) by mouth at bedtime as needed for sleep. Patient not taking: Reported on 06/02/2022 03/20/22   Volanda Napoleon, MD     Critical care time: 30 min    Wells Guiles, DO 06/05/2022, 7:50 AM PGY-2, [place] for pager If no response to pager, please call PCCM consult pager After 7:00 pm call [place]

## 2022-06-05 NOTE — Progress Notes (Signed)
eLink Physician-Brief Progress Note Patient Name: [name] DOB: 07-17-1951 MRN: [id number]   Date of Service  06/05/2022  HPI/Events of Note  Hemoglobin 6.9 gm / dl.  eICU Interventions  Stat transfusion of 2 units PRBC ordered, Blood Bank asked to hold 4 units PRBC in reserve, H and H Q 4 hours, will obtain stat CTA of abdomen / pelvis if any further bleeding occurs.        [name] [name] 06/05/2022, 1:51 AM

## 2022-06-06 ENCOUNTER — Encounter (HOSPITAL_COMMUNITY)
Admission: EM | Disposition: A | Discharge status: Home Health | Payer: Self-pay | Source: Home / Self Care | Attending: Pulmonary Disease

## 2022-06-06 ENCOUNTER — Encounter (HOSPITAL_COMMUNITY): Payer: Self-pay | Admitting: Internal Medicine

## 2022-06-06 ENCOUNTER — Other Ambulatory Visit: Payer: Self-pay

## 2022-06-06 ENCOUNTER — Inpatient Hospital Stay (HOSPITAL_COMMUNITY): Payer: Medicare PPO | Admitting: Anesthesiology

## 2022-06-06 DIAGNOSIS — Z87891 Personal history of nicotine dependence: Secondary | ICD-10-CM | POA: Diagnosis not present

## 2022-06-06 DIAGNOSIS — E119 Type 2 diabetes mellitus without complications: Secondary | ICD-10-CM

## 2022-06-06 DIAGNOSIS — D62 Acute posthemorrhagic anemia: Secondary | ICD-10-CM | POA: Diagnosis not present

## 2022-06-06 DIAGNOSIS — K922 Gastrointestinal hemorrhage, unspecified: Secondary | ICD-10-CM | POA: Diagnosis not present

## 2022-06-06 DIAGNOSIS — K5730 Diverticulosis of large intestine without perforation or abscess without bleeding: Secondary | ICD-10-CM | POA: Diagnosis not present

## 2022-06-06 DIAGNOSIS — I10 Essential (primary) hypertension: Secondary | ICD-10-CM | POA: Diagnosis not present

## 2022-06-06 HISTORY — PX: COLONOSCOPY WITH PROPOFOL: SHX5780

## 2022-06-06 LAB — TYPE AND SCREEN
ABO/RH(D): A POS
Antibody Screen: NEGATIVE
Unit division: 0
Unit division: 0
Unit division: 0
Unit division: 0
Unit division: 0
Unit division: 0
Unit division: 0
Unit division: 0
Unit division: 0
Unit division: 0
Unit division: 0
Unit division: 0
Unit division: 0
Unit division: 0

## 2022-06-06 LAB — BPAM RBC
Blood Product Expiration Date: 202308142359
Blood Product Expiration Date: 202308262359
Blood Product Expiration Date: 202308262359
Blood Product Expiration Date: 202308282359
Blood Product Expiration Date: 202308282359
Blood Product Expiration Date: 202309012359
Blood Product Expiration Date: 202309012359
Blood Product Expiration Date: 202309022359
Blood Product Expiration Date: 202309022359
Blood Product Expiration Date: 202309022359
Blood Product Expiration Date: 202309022359
Blood Product Expiration Date: 202309052359
Blood Product Expiration Date: 202309052359
Blood Product Expiration Date: 202309052359
ISSUE DATE / TIME: 202308062026
ISSUE DATE / TIME: 202308070139
ISSUE DATE / TIME: 202308070608
ISSUE DATE / TIME: 202308072002
ISSUE DATE / TIME: 202308072002
ISSUE DATE / TIME: 202308080259
ISSUE DATE / TIME: 202308090443
ISSUE DATE / TIME: 202308090443
Unit Type and Rh: 6200
Unit Type and Rh: 6200
Unit Type and Rh: 6200
Unit Type and Rh: 6200
Unit Type and Rh: 6200
Unit Type and Rh: 6200
Unit Type and Rh: 6200
Unit Type and Rh: 6200
Unit Type and Rh: 6200
Unit Type and Rh: 6200
Unit Type and Rh: 6200
Unit Type and Rh: 6200
Unit Type and Rh: 6200
Unit Type and Rh: 6200

## 2022-06-06 LAB — CBC
HCT: 28.8 % — ABNORMAL LOW (ref 36.0–46.0)
HCT: 32.4 % — ABNORMAL LOW (ref 36.0–46.0)
Hemoglobin: 10.3 g/dL — ABNORMAL LOW (ref 12.0–15.0)
Hemoglobin: 11 g/dL — ABNORMAL LOW (ref 12.0–15.0)
MCH: 30.6 pg (ref 26.0–34.0)
MCH: 30.1 pg (ref 26.0–34.0)
MCHC: 35.8 g/dL (ref 30.0–36.0)
MCHC: 34 g/dL (ref 30.0–36.0)
MCV: 85.5 fL (ref 80.0–100.0)
MCV: 88.8 fL (ref 80.0–100.0)
Platelets: 131 10*3/uL — ABNORMAL LOW (ref 150–400)
Platelets: 160 10*3/uL (ref 150–400)
RBC: 3.37 MIL/uL — ABNORMAL LOW (ref 3.87–5.11)
RBC: 3.65 MIL/uL — ABNORMAL LOW (ref 3.87–5.11)
RDW: 15.7 % — ABNORMAL HIGH (ref 11.5–15.5)
RDW: 15.8 % — ABNORMAL HIGH (ref 11.5–15.5)
WBC: 7.5 10*3/uL (ref 4.0–10.5)
WBC: 10.6 10*3/uL — ABNORMAL HIGH (ref 4.0–10.5)
nRBC: 0 % (ref 0.0–0.2)
nRBC: 0 % (ref 0.0–0.2)

## 2022-06-06 LAB — BASIC METABOLIC PANEL
Anion gap: 7 (ref 5–15)
BUN: 13 mg/dL (ref 8–23)
CO2: 21 mmol/L — ABNORMAL LOW (ref 22–32)
Calcium: 8.4 mg/dL — ABNORMAL LOW (ref 8.9–10.3)
Chloride: 113 mmol/L — ABNORMAL HIGH (ref 98–111)
Creatinine, Ser: 1.54 mg/dL — ABNORMAL HIGH (ref 0.44–1.00)
GFR, Estimated: 35 mL/min — ABNORMAL LOW (ref >60)
Glucose, Bld: 114 mg/dL — ABNORMAL HIGH (ref 70–99)
Potassium: 3.7 mmol/L (ref 3.5–5.1)
Sodium: 141 mmol/L (ref 135–145)

## 2022-06-06 LAB — GLUCOSE, CAPILLARY
Glucose-Capillary: 117 mg/dL — ABNORMAL HIGH (ref 70–99)
Glucose-Capillary: 113 mg/dL — ABNORMAL HIGH (ref 70–99)
Glucose-Capillary: 162 mg/dL — ABNORMAL HIGH (ref 70–99)

## 2022-06-06 SURGERY — COLONOSCOPY WITH PROPOFOL
Anesthesia: Monitor Anesthesia Care

## 2022-06-06 MED ORDER — PNEUMOCOCCAL 20-VAL CONJ VACC 0.5 ML IM SUSY: 0.5000 mL | PREFILLED_SYRINGE | INTRAMUSCULAR | Status: DC | PRN

## 2022-06-06 MED ORDER — PHENYLEPHRINE 80 MCG/ML (10ML) SYRINGE FOR IV PUSH (FOR BLOOD PRESSURE SUPPORT)
PREFILLED_SYRINGE | INTRAVENOUS | Status: DC | PRN
  Administered 2022-06-06: 160 ug via INTRAVENOUS

## 2022-06-06 MED ORDER — SODIUM CHLORIDE 0.9 % IV SOLN: INTRAVENOUS | Status: DC

## 2022-06-06 MED ORDER — PROPOFOL 500 MG/50ML IV EMUL
INTRAVENOUS | Status: DC | PRN
  Administered 2022-06-06: 100 ug/kg/min via INTRAVENOUS

## 2022-06-06 SURGICAL SUPPLY — 22 items

## 2022-06-06 NOTE — Anesthesia Postprocedure Evaluation (Signed)
Anesthesia Post Note  Patient: [name]  Procedure(s) Performed: COLONOSCOPY WITH PROPOFOL     Patient location during evaluation: Endoscopy Anesthesia Type: MAC Level of consciousness: awake and alert Pain management: pain level controlled Vital Signs Assessment: post-procedure vital signs reviewed and stable Respiratory status: spontaneous breathing, nonlabored ventilation, respiratory function stable and patient connected to nasal cannula oxygen Cardiovascular status: blood pressure returned to baseline and stable Postop Assessment: no apparent nausea or vomiting Anesthetic complications: no   No notable events documented.  Last Vitals:  Vitals:   06/06/22 1347 06/06/22 1357  BP: (!) 89/63 102/68  Pulse: 81 72  Resp: 10 16  Temp:    SpO2: 99% 99%    Last Pain:  Vitals:   06/06/22 1357  TempSrc:   PainSc: 0-No pain                 Barnet Glasgow

## 2022-06-06 NOTE — Progress Notes (Signed)
Second half of bowel prep kit administered per [facility] order.  Erling Conte, RN

## 2022-06-06 NOTE — Anesthesia Procedure Notes (Signed)
Procedure Name: MAC Date/Time: 06/06/2022 1:05 PM  Performed by: Griffin Dakin, CRNAPre-anesthesia Checklist: Patient identified, Emergency Drugs available, Suction available, Patient being monitored and Timeout performed Patient Re-evaluated:Patient Re-evaluated prior to induction Oxygen Delivery Method: Nasal cannula Induction Type: IV induction Placement Confirmation: positive ETCO2 and breath sounds checked- equal and bilateral Dental Injury: Teeth and Oropharynx as per pre-operative assessment

## 2022-06-06 NOTE — Interval H&P Note (Signed)
History and Physical Interval Note:  06/06/2022 12:51 PM  [name]  has presented today for surgery, with the diagnosis of hematochezia, anemia.  The various methods of treatment have been discussed with the patient and family. After consideration of risks, benefits and other options for treatment, the patient has consented to  Procedure(s): COLONOSCOPY WITH PROPOFOL (N/A) as a surgical intervention.  The patient's history has been reviewed, patient examined, no change in status, stable for surgery.  I have reviewed the patient's chart and labs.  Questions were answered to the patient's satisfaction.     Landry Dyke

## 2022-06-06 NOTE — Op Note (Signed)
[facility] Patient Name: [name] Procedure Date : 06/06/2022 MRN: [id number] Attending MD: Arta Silence , MD Date of Birth: 31-Jul-1951 CSN: [id number] Age: 71 Admit Type: Inpatient Procedure:                Colonoscopy Indications:              Hematochezia, Acute post hemorrhagic anemia Providers:                Arta Silence, MD, Ladoris Gene, RN, Luan Moore, Technician, Viann Fish, CRNA Referring MD:             [facility] Medicines:                Monitored Anesthesia Care Complications:            No immediate complications. Estimated Blood Loss:     Estimated blood loss: none. Procedure:                Pre-Anesthesia Assessment:                           - Prior to the procedure, a History and Physical                            was performed, and patient medications and                            allergies were reviewed. The patient's tolerance of                            previous anesthesia was also reviewed. The risks                            and benefits of the procedure and the sedation                            options and risks were discussed with the patient.                            All questions were answered, and informed consent                            was obtained. Prior Anticoagulants: The patient has                            taken Eliquis (apixaban), last dose was 4 days                            prior to procedure. ASA Grade Assessment: III - A                            patient with severe systemic disease. After  reviewing the risks and benefits, the patient was                            deemed in satisfactory condition to undergo the                            procedure.                           After obtaining informed consent, the colonoscope                            was passed under direct vision. Throughout the                             procedure, the patient's blood pressure, pulse, and                            oxygen saturations were monitored continuously. The                            PCF-HQ190L ([id number]) Olympus colonoscope was                            introduced through the anus and advanced to the the                            terminal ileum, with identification of the                            appendiceal orifice and IC valve. The entire colon                            was examined. The colonoscopy was performed without                            difficulty. The patient tolerated the procedure                            well. The quality of the bowel preparation was good. Scope In: 1:12:36 PM Scope Out: 1:31:03 PM Scope Withdrawal Time: 0 hours 12 minutes 17 seconds  Total Procedure Duration: 0 hours 18 minutes 27 seconds  Findings:      Multiple medium-mouthed diverticula were found in the sigmoid colon,       descending colon, transverse colon and ascending colon.      Colon otherwise normal; no other polyps, masses, vascular ectasias, or       inflammatory changes were seen.      The retroflexed view of the distal rectum and anal verge was normal and       showed no anal or rectal abnormalities.      The terminal ileum appeared normal. No old or fresh blood seen to the       extent of our examination. Impression:               - Diverticulosis in  the sigmoid colon, in the                            descending colon, in the transverse colon and in                            the ascending colon.                           - The distal rectum and anal verge are normal on                            retroflexion view.                           - The examined portion of the ileum was normal.                           - The examination was otherwise normal. Suspect                            bleeding from diverticulosis. No old or fresh blood                            seen to extent of our  examination. Recommendation:           - Return patient to hospital ward for ongoing care.                           - Mechanical soft diet today.                           - Continue present medications.                           [facility] will follow. Procedure Code(s):        --- Professional ---                           [phone], Colonoscopy, flexible; diagnostic, including                            collection of specimen(s) by brushing or washing,                            when performed (separate procedure) Diagnosis Code(s):        --- Professional ---                           K92.1, Melena (includes Hematochezia)                           D62, Acute posthemorrhagic anemia                           K57.30, Diverticulosis of large intestine without  perforation or abscess without bleeding CPT copyright 2019 [employer]. All rights reserved. The codes documented in this report are preliminary and upon coder review may  be revised to meet current compliance requirements. Arta Silence, MD 06/06/2022 1:45:15 PM This report has been signed electronically. Number of Addenda: 0

## 2022-06-06 NOTE — Transfer of Care (Signed)
Immediate Anesthesia Transfer of Care Note  Patient: [name]  Procedure(s) Performed: COLONOSCOPY WITH PROPOFOL  Patient Location: Endoscopy Unit  Anesthesia Type:MAC  Level of Consciousness: drowsy  Airway & Oxygen Therapy: Patient Spontanous Breathing and Patient connected to nasal cannula oxygen  Post-op Assessment: Report given to RN and Post -op Vital signs reviewed and stable  Post vital signs: Reviewed and stable  Last Vitals:  Vitals Value Taken Time  BP    Temp    Pulse 74 06/06/22 1338  Resp 14 06/06/22 1338  SpO2 100 % 06/06/22 1338  Vitals shown include unvalidated device data.  Last Pain:  Vitals:   06/06/22 1229  TempSrc: Temporal  PainSc: 0-No pain      Patients Stated Pain Goal: 0 (74/25/95 6387)  Complications: No notable events documented.

## 2022-06-06 NOTE — Progress Notes (Signed)
NAME:  [name], MRN:  [id number], DOB:  Jan 01, 1951, LOS: 4 ADMISSION DATE:  06/02/2022, CONSULTATION DATE:  06/03/22 REFERRING MD:  Otilio Miu, NP , CHIEF COMPLAINT:  lower gi bleeding   History of Present Illness:  71 year old woman with with recent admission 12/2021 for V.Fib arrest with saddle PE s/p thrombectomy, started on Eliquis, and then subsequent admission 4/29 with lower GI bleed secondary to hemorrhoids.   Presents to ED 8/6 with reported rectal bleeding. 8/5 was more tired then normal and weak. Per patient she got up and went to bathroom this morning and had a gush of dark blood per rectum which happened 3-4 additional times. On arrival to ER patient had an additional episode of large volume dark red blood with noted blood clots. GI consulted. Hemoglobin 8.9. Given 1 unit RBC. BP 112/90. HR 90. Amitted to 5N.   Overnight continued to be somewhat hypotensive, received 2 u PRBC.  Hb came up from 4.5 to 7, But had another episode of 300cc of melena.   BP remained low 75F-16B systolic.   CTA this morning did not reveal source of bleeding.    PCCM consulted given ongoing bleeding and concern for hemodynamic stability.  NM scan: 1752:  IMPRESSION: 1. Faint area of increased radiotracer activity localizing to the right lower quadrant during the second hour of imaging, without evidence of propagation to suggest peristalsis. Findings are equivocal for active gastrointestinal bleeding in the region of the distal small bowel or cecum. Pertinent  Medical History   V.Fib Arrest secondary to saddle PE (12/2021) on Eliquis, ITP, anxiety/depression, osteoarthritis, fibromyalgia, asthma, Type 2 DM, GERD, HLD, HTN, admission 02/23/2022 with lower GI bleed secondary to hemorrhoids   Significant Hospital Events: Including procedures, antibiotic start and stop dates in addition to other pertinent events   8/7 Transferred to ICU, s/p 3u pRBC 8/8 736m bloody stool overnight, another 3u pRBC, now s/p  total 6u pRBC 8/9 200-3060mblood stool overnight, another 2u pRBC ordered, now s/p total 8u pRBC 8/10 continued bloody stool overnight, colonoscopy  Interim History / Subjective:   Objective   Blood pressure (!) 152/83, pulse (!) 119, temperature 98.3 F (36.8 C), temperature source Oral, resp. rate (!) 21, SpO2 97 %.        Intake/Output Summary (Last 24 hours) at 06/06/2022 [id number] data filed at 06/06/2022 0700 Gross per 24 hour  Intake 2356.91 ml  Output 1900 ml  Net 456.91 ml   Examination:  General: A&Ox3, NAD, conversationally appropriate with sense of humor HEENT: no pallow, MMM CV: RRR, no murmurs auscultated Pulm: CTAB, normal WOB Abdomen: soft, nontender, nondistended, normoactive BS Extremities: warm, dry, no edema present  Resolved Hospital Problem list     Assessment & Plan:  GI bleed concern for diverticular bleed  Acute blood loss anemia EGD and colonoscopy 01/2022 with diverticulosis and hemorrhoids, mild gastritis. Hb dropped into the 4s and transferred to ICU on 8/6. Now s/p 8u pRBC. -GI following, colonoscopy today -NM GI scan notes active GI bleed in region of distal small bowel or cecum. Repeat CTA unable to identify bleed but showed contrast throughout the colon likely from slow/prior hemorrhage -Case discussed with IR, would not embolize without direct source confirmation via CTA.   -Continue PPI BID -Continue to hold all anticoagulation  -CBC q12h   Hemorrhagic Shock  H/O V.Fib Arrest secondary to saddle pulmonary embolism on Eliquis. Received Kcentra in ED. H/o HTN. Blood pressure stable -Maintain MAP >65  -Hold  home antihypertensives  Acute on CKD stage IV  Crt 1.49 at discharge 01/2022. Crt on admit 2.19 BUN 40 -Trend BMP, Crt stable at 1.83 -Replace electrolytes as indicated    Type 2 DM -Trend Glucose, no SSI currently -Full liquid diet   Chronic Pain/Fibromyalgia  -on home Cymbalta, paroxetine, temazepam, melatonin  Diet/type:  NPO DVT prophylaxis: not indicated GI prophylaxis: PPI Lines: N/A PIV x2 Foley:  N/A Code Status:  full code Last date of multidisciplinary goals of care discussion '[]'$   Labs   CBC: Recent Labs  Lab 06/02/22 1100 06/02/22 1812 06/03/22 1128 06/03/22 1648 06/04/22 0113 06/04/22 0923 06/04/22 1650 06/05/22 0114 06/05/22 0953 06/05/22 1632  WBC 7.3   < > 10.8* 10.4 11.4*  --  7.9  --  9.8 8.8  NEUTROABS 3.6  --  6.2  --   --   --   --   --   --   --   HGB 8.9*   < > 8.6* 6.9* 7.5* 8.6* 8.2* 6.9* 10.2* 10.3*  HCT 28.2*   < > 25.0* 19.8* 21.8* 24.3* 23.2* 19.8* 29.3* 28.9*  MCV 86.8   < > 84.5 84.3 81.3  --  83.5  --  86.2 85.0  PLT 296   < > 154 140* 107*  --  103*  --  98* 112*   < > = values in this interval not displayed.   Basic Metabolic Panel: Recent Labs  Lab 06/02/22 1100 06/03/22 0341 06/04/22 0113 06/05/22 0953 06/06/22 0740  NA 140 142 138 140 141  K 4.3 4.3 3.9 3.7 3.7  CL 110 116* 114* 113* 113*  CO2 22 17* 21* 23 21*  GLUCOSE 293* 201* 188* 191* 114*  BUN 40* 34* 30* 20 13  CREATININE 2.19* 1.79* 1.83* 1.60* 1.54*  CALCIUM 8.3* 7.8* 7.5* 7.9* 8.4*   GFR: Estimated Creatinine Clearance: 27.4 mL/min (A) (by C-G formula based on SCr of 1.54 mg/dL (H)). Recent Labs  Lab 06/04/22 0113 06/04/22 1650 06/05/22 0953 06/05/22 1632  WBC 11.4* 7.9 9.8 8.8   Liver Function Tests: Recent Labs  Lab 06/02/22 1100 06/04/22 0113  AST 20 13*  ALT 15 10  ALKPHOS 58 28*  BILITOT 0.4 1.5*  PROT 5.6* 3.2*  ALBUMIN 2.7* 1.8*   No results for input(s): "LIPASE", "AMYLASE" in the last 168 hours. No results for input(s): "AMMONIA" in the last 168 hours.  ABG    Component Value Date/Time   PHART 7.457 (H) 11/08/2021 1335   PCO2ART 32.5 11/08/2021 1335   PO2ART 83 11/08/2021 1335   HCO3 13.3 (L) 11/10/2021 2239   TCO2 14 (L) 11/10/2021 2239   ACIDBASEDEF 12.0 (H) 11/10/2021 2239   O2SAT 64.0 11/10/2021 2239     Coagulation Profile: Recent Labs  Lab  06/02/22 1100 06/03/22 0341 06/03/22 1128  INR 1.3* 1.3* 1.2   Cardiac Enzymes: No results for input(s): "CKTOTAL", "CKMB", "CKMBINDEX", "TROPONINI" in the last 168 hours.  HbA1C: Hgb A1c MFr Bld  Date/Time Value Ref Range Status  01/09/2022 06:54 PM 6.3 (H) 4.8 - 5.6 % Final    Comment:    (NOTE)         Prediabetes: 5.7 - 6.4         Diabetes: >6.4         Glycemic control for adults with diabetes: <7.0   10/28/2021 03:54 PM 9.0 (H) 4.8 - 5.6 % Final    Comment:    (NOTE) Pre diabetes:  5.7%-6.4%  Diabetes:              >6.4%  Glycemic control for   <7.0% adults with diabetes     CBG: Recent Labs  Lab 06/05/22 0741 06/05/22 1210 06/05/22 1517 06/05/22 2200 06/06/22 0729  GLUCAP 112* 176* 133* 93 117*   Review of Systems:    Past Medical History:  She,  has a past medical history of Anxiety, Arthritis, Asthma, Cardiac arrest ([place]) (10/28/2021), Depression, Diabetes mellitus without complication ([place]), Fibromyalgia, GERD (gastroesophageal reflux disease), Hyperlipemia, Hypertension, and PONV (postoperative nausea and vomiting).   Surgical History:   Past Surgical History:  Procedure Laterality Date   COLONOSCOPY     COLONOSCOPY WITH PROPOFOL N/A 02/20/2022   Procedure: COLONOSCOPY WITH PROPOFOL;  Surgeon: Otis Brace, MD;  Location: [place];  Service: Gastroenterology;  Laterality: N/A;   DILATION AND CURETTAGE OF UTERUS     ESOPHAGOGASTRODUODENOSCOPY N/A 02/20/2022   Procedure: ESOPHAGOGASTRODUODENOSCOPY (EGD);  Surgeon: Otis Brace, MD;  Location[facility] ENDOSCOPY;  Service: Gastroenterology;  Laterality: N/A;   IR EMBO ART  VEN HEMORR LYMPH EXTRAV  INC GUIDE ROADMAPPING  11/01/2021   IR FLUORO GUIDE CV LINE RIGHT  11/20/2021   IR REMOVAL TUN CV CATH W/O FL  12/20/2021   IR THROMBECT PRIM MECH INIT (INCLU) MOD SED  10/29/2021   IR US GUIDE VASC ACCESS RIGHT  10/29/2021   IR US GUIDE VASC ACCESS RIGHT  11/01/2021   IR US GUIDE VASC ACCESS RIGHT   11/21/2021   KNEE ARTHROSCOPY  [id number]   left   POLYPECTOMY  02/20/2022   Procedure: POLYPECTOMY;  Surgeon: Otis Brace, MD;  Location: [place];  Service: Gastroenterology;;   SHOULDER ARTHROSCOPY     left   TRIGGER FINGER RELEASE  08/13/2012   Procedure: RELEASE TRIGGER FINGER/A-1 PULLEY;  Surgeon: [name]., MD;  Location: [place];  Service: Orthopedics;  Laterality: Right;  long finger   TUBAL LIGATION       Social History:   reports that she quit smoking about 11 years ago. Her smoking use included cigarettes. She has a 10.00 pack-year smoking history. She has never used smokeless tobacco. She reports that she does not drink alcohol and does not use drugs.   Family History:  Her family history includes Diabetes in her mother and sister; Heart attack in her father; Hypertension in her daughter; Throat cancer in her brother.   Allergies Allergies  Allergen Reactions   Darvon [Propoxyphene] Itching   Hydrocodone Bit-Homatrop Mbr Itching   Metformin Nausea And Vomiting   Pentazocine Other (See Comments)      jitters   Percocet [Oxycodone-Acetaminophen] Itching   Sulfa Antibiotics Hives   Tetracaine Hcl Itching   Tetracyclines & Related Hives   Tramadol Itching   Benadryl [Diphenhydramine] Itching and Other (See Comments)    Can only take dye free. States the dye causes itching.   Other     Other reaction(s): Unknown. States reaction was to nasal spray that caused pupils to shrink   Aspirin Nausea And Vomiting     Home Medications  Prior to Admission medications   Medication Sig Start Date End Date Taking? Authorizing Provider  acetaminophen (TYLENOL) 325 MG tablet Take 2 tablets (650 mg total) by mouth every 4 (four) hours as needed for fever (greater than 37.6 C). Patient taking differently: Take 650 mg by mouth daily as needed (pain). 01/07/22  Yes Angiulli, Lavon Paganini, PA-C  albuterol (VENTOLIN HFA) 108 (90  Base) MCG/ACT inhaler Inhale  1-2 puffs into the lungs every 4 (four) hours as needed for wheezing or shortness of breath. Patient taking differently: Inhale 2 puffs into the lungs daily as needed for wheezing or shortness of breath. 02/14/22  Yes Medina-Vargas, Monina C, NP  amLODipine (NORVASC) 10 MG tablet Take 10 mg by mouth daily. 03/06/22  Yes [provider]  apixaban (ELIQUIS) 5 MG TABS tablet Take 1 tablet (5 mg total) by mouth 2 (two) times daily. 02/14/22  Yes Medina-Vargas, Monina C, NP  atorvastatin (LIPITOR) 20 MG tablet Take 1 tablet (20 mg total) by mouth daily. 02/14/22  Yes Medina-Vargas, Monina C, NP  B Complex-C (B-COMPLEX WITH VITAMIN C) tablet Take 1 tablet by mouth daily. 01/08/22  Yes Angiulli, Lavon Paganini, PA-C  dicyclomine (BENTYL) 10 MG capsule Take 1 capsule (10 mg total) by mouth 3 (three) times daily before meals. Patient taking differently: Take 10 mg by mouth 2 (two) times daily as needed (stomach cramps). 02/14/22  Yes Medina-Vargas, Monina C, NP  Ferrous Sulfate (IRON PO) Take 1 tablet by mouth daily.   Yes [provider]  FLOVENT HFA 220 MCG/ACT inhaler Inhale 2 puffs into the lungs as needed (wheezing/SOB). 03/01/22  Yes [provider]  Glucerna (GLUCERNA) LIQD Take 237 mLs by mouth 2 (two) times daily between meals.   Yes [provider]  lidocaine (LIDODERM) 5 % Place 2 patches onto the skin daily. Remove & Discard patch within 12 hours or as directed by MD Patient taking differently: Place 1 patch onto the skin daily as needed (pain). Remove & Discard patch within 12 hours or as directed by MD 02/14/22  Yes Medina-Vargas, Monina C, NP  ondansetron (ZOFRAN) 4 MG tablet Take 4 mg by mouth as needed for nausea or vomiting. 06/01/22  Yes [provider]  oxyCODONE (OXY IR/ROXICODONE) 5 MG immediate release tablet Take 5 mg by mouth daily as needed for severe pain.   Yes [provider]  pantoprazole (PROTONIX) 40 MG tablet Take 1 tablet (40 mg total) by  mouth 2 (two) times daily. 02/14/22  Yes Medina-Vargas, Monina C, NP  simvastatin (ZOCOR) 40 MG tablet Take 40 mg by mouth daily.   Yes [provider]  Vitamin D, Ergocalciferol, (DRISDOL) 1.25 MG (50000 UNIT) CAPS capsule Take 1 capsule (50,000 Units total) by mouth once a week. 01/07/22  Yes Angiulli, Lavon Paganini, PA-C  DULoxetine (CYMBALTA) 20 MG capsule Take 1 capsule (20 mg total) by mouth daily. Patient not taking: Reported on 06/02/2022 02/14/22   Medina-Vargas, Monina C, NP  folic acid (FOLVITE) 1 MG tablet Take 2 tablets (2 mg total) by mouth daily. Patient not taking: Reported on 06/02/2022 02/14/22   Medina-Vargas, Monina C, NP  melatonin 5 MG TABS Take 1 tablet (5 mg total) by mouth every evening. Patient not taking: Reported on 06/02/2022 02/14/22   Medina-Vargas, Monina C, NP  PARoxetine (PAXIL) 40 MG tablet Take by mouth daily. Patient not taking: Reported on 06/02/2022 03/06/22   [provider]  temazepam (RESTORIL) 7.5 MG capsule Take 1 capsule (7.5 mg total) by mouth at bedtime as needed for sleep. Patient not taking: Reported on 06/02/2022 03/20/22   Volanda Napoleon, MD     Critical care time: 36 min    Wells Guiles, DO 06/06/2022, 9:03 AM PGY-2, [place] for pager If no response to pager, please call PCCM consult pager After 7:00 pm call Elink

## 2022-06-07 ENCOUNTER — Inpatient Hospital Stay (HOSPITAL_COMMUNITY): Payer: Medicare PPO

## 2022-06-07 DIAGNOSIS — I10 Essential (primary) hypertension: Secondary | ICD-10-CM | POA: Diagnosis not present

## 2022-06-07 DIAGNOSIS — N189 Chronic kidney disease, unspecified: Secondary | ICD-10-CM | POA: Diagnosis not present

## 2022-06-07 DIAGNOSIS — E1169 Type 2 diabetes mellitus with other specified complication: Secondary | ICD-10-CM

## 2022-06-07 DIAGNOSIS — N184 Chronic kidney disease, stage 4 (severe): Secondary | ICD-10-CM

## 2022-06-07 DIAGNOSIS — Z794 Long term (current) use of insulin: Secondary | ICD-10-CM

## 2022-06-07 DIAGNOSIS — Z862 Personal history of diseases of the blood and blood-forming organs and certain disorders involving the immune mechanism: Secondary | ICD-10-CM

## 2022-06-07 DIAGNOSIS — K922 Gastrointestinal hemorrhage, unspecified: Secondary | ICD-10-CM | POA: Diagnosis not present

## 2022-06-07 LAB — BASIC METABOLIC PANEL
Anion gap: 4 — ABNORMAL LOW (ref 5–15)
BUN: 12 mg/dL (ref 8–23)
CO2: 25 mmol/L (ref 22–32)
Calcium: 7.8 mg/dL — ABNORMAL LOW (ref 8.9–10.3)
Chloride: 111 mmol/L (ref 98–111)
Creatinine, Ser: 1.63 mg/dL — ABNORMAL HIGH (ref 0.44–1.00)
GFR, Estimated: 33 mL/min — ABNORMAL LOW (ref >60)
Glucose, Bld: 154 mg/dL — ABNORMAL HIGH (ref 70–99)
Potassium: 3.7 mmol/L (ref 3.5–5.1)
Sodium: 140 mmol/L (ref 135–145)

## 2022-06-07 LAB — GLUCOSE, CAPILLARY
Glucose-Capillary: 276 mg/dL — ABNORMAL HIGH (ref 70–99)
Glucose-Capillary: 130 mg/dL — ABNORMAL HIGH (ref 70–99)
Glucose-Capillary: 127 mg/dL — ABNORMAL HIGH (ref 70–99)
Glucose-Capillary: 229 mg/dL — ABNORMAL HIGH (ref 70–99)
Glucose-Capillary: 231 mg/dL — ABNORMAL HIGH (ref 70–99)

## 2022-06-07 NOTE — Progress Notes (Signed)
Patient has both upper and lower dentures in her possession at this time.

## 2022-06-07 NOTE — Plan of Care (Signed)

## 2022-06-07 NOTE — Progress Notes (Signed)
Subjective: No abdominal pain. No further bleeding. Feels weak.  Objective: Vital signs in last 24 hours: Temp:  [98 F (36.7 C)-98.7 F (37.1 C)] 98.1 F (36.7 C) (08/11 1128) Pulse Rate:  [72-112] 80 (08/11 1128) Resp:  [10-20] 16 (08/11 1128) BP: (89-122)/(55-96) 119/70 (08/11 1128) SpO2:  [97 %-100 %] 99 % (08/11 1128) Weight change:  Last BM Date : 06/06/22  PE: GEN: NAD, younger-appearing than stated age NEURO:  Alert, no encephalopathy  Lab Results: CMP     Component Value Date/Time   NA 140 06/07/2022 0301   NA 139 01/23/2022 0000   K 3.7 06/07/2022 0301   CL 111 06/07/2022 0301   CO2 25 06/07/2022 0301   GLUCOSE 154 (H) 06/07/2022 0301   BUN 12 06/07/2022 0301   BUN 23 (A) 01/23/2022 0000   CREATININE 1.63 (H) 06/07/2022 0301   CREATININE 1.83 (H) 04/26/2022 0952   CREATININE 1.14 (H) 03/06/2017 1502   CALCIUM 7.8 (L) 06/07/2022 0301   PROT 3.2 (L) 06/04/2022 0113   ALBUMIN 1.8 (L) 06/04/2022 0113   AST 13 (L) 06/04/2022 0113   AST 16 04/26/2022 0952   ALT 10 06/04/2022 0113   ALT 11 04/26/2022 0952   ALKPHOS 28 (L) 06/04/2022 0113   BILITOT 1.5 (H) 06/04/2022 0113   BILITOT 0.4 04/26/2022 0952   GFRNONAA 33 (L) 06/07/2022 0301   GFRNONAA 29 (L) 04/26/2022 0952   GFRNONAA 50 (L) 03/06/2017 1502   GFRAA 51 (L) 04/11/2019 1121   GFRAA 57 (L) 03/06/2017 1502  CBC    Component Value Date/Time   WBC 10.6 (H) 06/06/2022 1643   RBC 3.65 (L) 06/06/2022 1643   HGB 11.0 (L) 06/06/2022 1643   HGB 10.9 (L) 04/26/2022 0952   HCT 32.4 (L) 06/06/2022 1643   PLT 160 06/06/2022 1643   PLT 450 (H) 04/26/2022 0952   MCV 88.8 06/06/2022 1643   MCH 30.1 06/06/2022 1643   MCHC 34.0 06/06/2022 1643   RDW 15.8 (H) 06/06/2022 1643   LYMPHSABS 3.6 06/03/2022 1128   MONOABS 0.8 06/03/2022 1128   EOSABS 0.1 06/03/2022 1128   BASOSABS 0.1 06/03/2022 1128   Assessment:  Hematochezia.  Most likely diverticular bleeding. Acute blood loss anemia. Pulmonary  embolism. Chronic anticoagulation, apixaban, for #3 above, on hold.  Plan:   Advance diet. Follow CBCs (currently stable). Consider PT evaluation (patient weak and has been essentially in bed x 5 days). Does patient absolutely have to be back on anticoagulation?  Weigh pros/cons closely.  If anticoagulation strongly needed, would ideally wait another one week prior to restarting. [facility] will sign-off; please call with questions; thank you for the consultation; we will arrange outpatient follow-up.   Landry Dyke 06/07/2022, 1:43 PM   Cell [phone] If no answer or after 5 PM call [phone]

## 2022-06-07 NOTE — Progress Notes (Signed)
NAME:  [name], MRN:  [id number], DOB:  December 29, 1950, LOS: 5 ADMISSION DATE:  06/02/2022, CONSULTATION DATE:  06/03/22 REFERRING MD:  Otilio Miu, NP , CHIEF COMPLAINT:  lower gi bleeding   History of Present Illness:  71 year old woman with with recent admission 12/2021 for V.Fib arrest with saddle PE s/p thrombectomy, started on Eliquis, and then subsequent admission 4/29 with lower GI bleed secondary to hemorrhoids.   Presents to ED 8/6 with reported rectal bleeding. 8/5 was more tired then normal and weak. Per patient she got up and went to bathroom this morning and had a gush of dark blood per rectum which happened 3-4 additional times. On arrival to ER patient had an additional episode of large volume dark red blood with noted blood clots. GI consulted. Hemoglobin 8.9. Given 1 unit RBC. BP 112/90. HR 90. Amitted to 5N.   Overnight continued to be somewhat hypotensive, received 2 u PRBC.  Hb came up from 4.5 to 7, But had another episode of 300cc of melena.   BP remained low 22L-79G systolic.   CTA this morning did not reveal source of bleeding.    PCCM consulted given ongoing bleeding and concern for hemodynamic stability.  NM scan: 1752:  IMPRESSION: 1. Faint area of increased radiotracer activity localizing to the right lower quadrant during the second hour of imaging, without evidence of propagation to suggest peristalsis. Findings are equivocal for active gastrointestinal bleeding in the region of the distal small bowel or cecum. Pertinent  Medical History   V.Fib Arrest secondary to saddle PE (12/2021) on Eliquis, ITP, anxiety/depression, osteoarthritis, fibromyalgia, asthma, Type 2 DM, GERD, HLD, HTN, admission 02/23/2022 with lower GI bleed secondary to hemorrhoids   Significant Hospital Events: Including procedures, antibiotic start and stop dates in addition to other pertinent events   8/7 Transferred to ICU, s/p 3u pRBC 8/8 780m bloody stool overnight, another 3u pRBC, now s/p  total 6u pRBC 8/9 200-3066mblood stool overnight, another 2u pRBC ordered, now s/p total 8u pRBC 8/10 continued bloody stool overnight, colonoscopy  Interim History / Subjective:   Objective   Blood pressure 119/70, pulse 80, temperature 98.1 F (36.7 C), temperature source Oral, resp. rate 16, SpO2 99 %.       No intake or output data in the 24 hours ending 06/07/22 1303  Examination:  General: No acute distress at rest HEENT: MM pink/moist no JVD Neuro: Intact CV: Heart sounds are regular PULM: Diminished in the bases on room air sats 94% GI: soft, bsx4 active  GU: Voids Extremities: warm/dry, negative edema  Skin: no rashes or lesions   Resolved Hospital Problem list     Assessment & Plan:  GI bleed concern for diverticular bleed  Acute blood loss anemia EGD and colonoscopy 01/2022 with diverticulosis and hemorrhoids, mild gastritis. Hb dropped into the 4s and transferred to ICU on 8/6. Now s/p 8u pRBC. Recent Labs    06/06/22 1106 06/06/22 1643  HGB 10.3* 11.0*     GI is following Continue proton pump inhibitor No cough or interventional radiology at this time  hold anticoagulation Serial CBCs    -GI following, colonoscopy today -NM GI scan notes active GI bleed in region of distal small bowel or cecum. Repeat CTA unable to identify bleed but showed contrast throughout the colon likely from slow/prior hemorrhage -Case discussed with IR, would not embolize without direct source confirmation via CTA.   -Continue PPI BID -Continue to hold all anticoagulation  -CBC q12h  Hemorrhagic Shock  H/O V.Fib Arrest secondary to saddle pulmonary embolism on Eliquis. Received Kcentra in ED. H/o HTN. Blood pressure stable Resolved  Acute on CKD stage IV  Lab Results  Component Value Date   CREATININE 1.63 (H) 06/07/2022   CREATININE 1.54 (H) 06/06/2022   CREATININE 1.60 (H) 06/05/2022   CREATININE 1.83 (H) 04/26/2022   CREATININE 1.58 (H) 03/20/2022    CREATININE 1.14 (H) 03/06/2017    Avoid nephrotoxins   Type 2 DM CBG (last 3)  Recent Labs    06/06/22 2115 06/07/22 0535 06/07/22 1125  GLUCAP 276* 130* 127*    Sliding-scale insulin protocol   Chronic Pain/Fibromyalgia  Continue home meds Cymbalta, paroxetine, temazepam, melatonin Pulmonary critical care will be available as needed   Diet/type: NPO DVT prophylaxis: not indicated GI prophylaxis: PPI Lines: N/A PIV x2 Foley:  N/A Code Status:  full code Last date of multidisciplinary goals of care discussion '[]'$   Labs   CBC: Recent Labs  Lab 06/02/22 1100 06/02/22 1812 06/03/22 1128 06/03/22 1648 06/04/22 1650 06/05/22 0114 06/05/22 0953 06/05/22 1632 06/06/22 1106 06/06/22 1643  WBC 7.3   < > 10.8*   < > 7.9  --  9.8 8.8 7.5 10.6*  NEUTROABS 3.6  --  6.2  --   --   --   --   --   --   --   HGB 8.9*   < > 8.6*   < > 8.2* 6.9* 10.2* 10.3* 10.3* 11.0*  HCT 28.2*   < > 25.0*   < > 23.2* 19.8* 29.3* 28.9* 28.8* 32.4*  MCV 86.8   < > 84.5   < > 83.5  --  86.2 85.0 85.5 88.8  PLT 296   < > 154   < > 103*  --  98* 112* 131* 160   < > = values in this interval not displayed.   Basic Metabolic Panel: Recent Labs  Lab 06/03/22 0341 06/04/22 0113 06/05/22 0953 06/06/22 0740 06/07/22 0301  NA 142 138 140 141 140  K 4.3 3.9 3.7 3.7 3.7  CL 116* 114* 113* 113* 111  CO2 17* 21* 23 21* 25  GLUCOSE 201* 188* 191* 114* 154*  BUN 34* 30* '20 13 12  '$ CREATININE 1.79* 1.83* 1.60* 1.54* 1.63*  CALCIUM 7.8* 7.5* 7.9* 8.4* 7.8*   GFR: CrCl cannot be calculated (Unknown ideal weight.). Recent Labs  Lab 06/05/22 0953 06/05/22 1632 06/06/22 1106 06/06/22 1643  WBC 9.8 8.8 7.5 10.6*   Liver Function Tests: Recent Labs  Lab 06/02/22 1100 06/04/22 0113  AST 20 13*  ALT 15 10  ALKPHOS 58 28*  BILITOT 0.4 1.5*  PROT 5.6* 3.2*  ALBUMIN 2.7* 1.8*   No results for input(s): "LIPASE", "AMYLASE" in the last 168 hours. No results for input(s): "AMMONIA" in the last  168 hours.  ABG    Component Value Date/Time   PHART 7.457 (H) 11/08/2021 1335   PCO2ART 32.5 11/08/2021 1335   PO2ART 83 11/08/2021 1335   HCO3 13.3 (L) 11/10/2021 2239   TCO2 14 (L) 11/10/2021 2239   ACIDBASEDEF 12.0 (H) 11/10/2021 2239   O2SAT 64.0 11/10/2021 2239     Coagulation Profile: Recent Labs  Lab 06/02/22 1100 06/03/22 0341 06/03/22 1128  INR 1.3* 1.3* 1.2   Cardiac Enzymes: No results for input(s): "CKTOTAL", "CKMB", "CKMBINDEX", "TROPONINI" in the last 168 hours.  HbA1C: Hgb A1c MFr Bld  Date/Time Value Ref Range Status  01/09/2022 06:54 PM 6.3 (H) 4.8 -  5.6 % Final    Comment:    (NOTE)         Prediabetes: 5.7 - 6.4         Diabetes: >6.4         Glycemic control for adults with diabetes: <7.0   10/28/2021 03:54 PM 9.0 (H) 4.8 - 5.6 % Final    Comment:    (NOTE) Pre diabetes:          5.7%-6.4%  Diabetes:              >6.4%  Glycemic control for   <7.0% adults with diabetes     CBG: Recent Labs  Lab 06/06/22 1150 06/06/22 1642 06/06/22 2115 06/07/22 0535 06/07/22 1125  GLUCAP 113* 162* 276* 130* 127*     Critical care time:     Richardson Landry Camerin Ladouceur ACNP Acute Care Nurse Practitioner [place] Please consult Amion 06/07/2022, 1:03 PM

## 2022-06-07 NOTE — Plan of Care (Signed)
  Problem: Education: Goal: Knowledge of General Education information will improve Description Including pain rating scale, medication(s)/side effects and non-pharmacologic comfort measures Outcome: Progressing   

## 2022-06-07 NOTE — Care Management Important Message (Signed)
Important Message  Patient Details  Name: [name] MRN: [id number] Date of Birth: 05-29-1951   Medicare Important Message Given:  Yes     Shelda Altes 06/07/2022, 8:09 AM

## 2022-06-07 NOTE — Progress Notes (Addendum)
[facility] PROGRESS NOTE    Progress Note  [name]  IDP:[id number] DOB: 17-May-1951 DOA: 06/02/2022 PCP: [name], MD     Brief Narrative:   [name] is an 71 y.o. female past medical history significant for cardiac arrest in January 2023, stage IV chronic kidney disease, diabetes mellitus type 2, hyperlipidemia, recently discharged from the hospital on 12/04/2021 for V-fib arrest due to PE underwent mechanical thrombectomy due to massive PE, hospitalization was complicated by intrahepatic/intraperitoneal hemorrhage with DIC, discharged to CIR and remained there until 313 12/17/2021, rehospitalized again on 01/09/2022 to 01/16/2022 for refractory hypoglycemia, discharged on 02/22/2028 for lower GI bleed from internal hemorrhoids who comes in for lower GI bleed while on Eliquis related she was off balance weak and dizzy had about 4 episodes of gushing blood in the bathroom.  On arrival to the ED her hemoglobin was hemoglobin went down to 4.5 was given 5 units of packed red blood cells blood pressure improved CT angio of the abdomen and pelvis showed revealed no source of bleeding. PCCM was consulted for ongoing GI bleed and hemodynamic instability. Nuclear scan showed increased radioactive tracer localized in the right lower quadrant    Assessment/Plan:   Acute lower GI bleeding probably due to diverticular bleed/acute blood loss anemia/hemorrhagic shock: EGD and colonoscopy in April 2023 showed diverticulosis hemorrhoids mild gastritis. GI was consulted recommended colonoscopy done on 06/06/2022 showed diverticulosis of the sigmoid colon showed no source of bleeding. Discussed with IR they will not embolized without a direct source confirmed by CTA. Continue IV PPI, continue to hold anticoagulation. Continue CBCs every 12 hours transfuse for hemoglobin less than 8. Awaiting GI further recommendations.  Hemorrhagic shock: Continue to hold antihypertensive medication,  MAP greater than 65.  Acute kidney injury on chronic kidney stage IV: With a baseline creatinine around 1.4, likely prerenal azotemia in the setting of hypovolemia due to GI bleed. Resolved with IV fluid resuscitation and blood transfusions.  Type 2 diabetes mellitus with other specified complication (HCC) On full liquid diet, continue sliding scale.  Chronic pain/fibromyalgia: Continue Cymbalta paroxetine temazepam and melatonin.   History of pulmonary embolism History of pulmonary embolism, she is not a candidate for anticoagulation at this time, I discussed with the patient the risk and benefits.  Goals of care, counseling/discussion Poor prognosis.   DVT prophylaxis: none Family Communication:none Status is: Inpatient Remains inpatient appropriate because: Hemorrhagic shock    Code Status:     Code Status Orders  (From admission, onward)           Start     Ordered   06/02/22 1325  Full code  Continuous        06/02/22 1326           Code Status History     Date Active Date Inactive Code Status Order ID Comments User Context   02/18/2022 2344 02/23/2022 2231 Full Code [id number]  Kayleen Memos, DO ED   01/09/2022 1834 01/17/2022 0136 DNR [id number]  Jonnie Finner, DO Inpatient   12/04/2021 1851 01/07/2022 1811 DNR [id number]  Cathlyn Parsons, PA-C Inpatient   11/22/2021 1437 12/04/2021 1847 DNR [id number]  Knox Royalty, NP Inpatient   10/28/2021 1411 11/22/2021 1437 Full Code [id number]  Donita Brooks, NP ED   10/28/2021 1408 10/28/2021 1411 Full Code [id number]  Donita Brooks, NP ED         IV Access:   Peripheral IV   Procedures and diagnostic studies:  CT Angio Abd/Pel w/ and/or w/o  Result Date: 06/05/2022 CLINICAL DATA:  71 year old female with concern for lower gastrointestinal hemorrhage. EXAM: CT ANGIOGRAPHY ABDOMEN AND PELVIS WITH CONTRAST AND WITHOUT CONTRAST TECHNIQUE: Multidetector CT imaging of the abdomen and pelvis was performed using the  standard protocol during bolus administration of intravenous contrast. Multiplanar reconstructed images and MIPs were obtained and reviewed to evaluate the vascular anatomy. RADIATION DOSE REDUCTION: This exam was performed according to the departmental dose-optimization program which includes automated exposure control, adjustment of the mA and/or kV according to patient size and/or use of iterative reconstruction technique. CONTRAST:  108m OMNIPAQUE IOHEXOL 350 MG/ML SOLN COMPARISON:  06/02/2022, 06/03/2022 FINDINGS: VASCULAR Aorta: Atherosclerosis without signficant stenosis, dissection, or aneurysm. Celiac: Patent without evidence of aneurysm, dissection, vasculitis or significant stenosis. SMA: Patent without evidence of aneurysm, dissection, vasculitis or significant stenosis. Renals: Single bilateral renal arteries which are patent. Mild corrugated appearance of the mid bilateral, right greater than left renal arteries. IMA: High-grade ostial stenosis secondary to atherosclerotic plaque with mild poststenotic ectasia. Patent distally. Inflow: Patent without evidence of aneurysm, dissection, vasculitis or significant stenosis. Proximal Outflow: Bilateral common femoral and visualized portions of the superficial and profunda femoral arteries are patent without evidence of aneurysm, dissection, vasculitis or significant stenosis. Veins: The hepatic veins are widely patent. The portal system is widely patent and normal in caliber. The renal veins are patent bilaterally in standard anatomic configuration. No evidence of iliocaval thrombosis or anomaly. Review of the MIP images confirms the above findings. NON-VASCULAR Lower chest: Similar appearing left greater than right bibasilar subsegmental atelectasis. The heart is normal in size. No pericardial effusion. Hepatobiliary: Similar appearing left lobe, partially exophytic simple appearing cyst measuring up to approximately 5.2 cm. The remaining liver is within  normal limits of size and morphology. The gallbladder is present with vicarious excretion of contrast material. No intra or extrahepatic biliary ductal dilation. Pancreas: Unremarkable. No pancreatic ductal dilatation or surrounding inflammatory changes. Spleen: Normal in size without focal abnormality. Adrenals/Urinary Tract: Adrenal glands are unremarkable. Kidneys are normal, without renal calculi, focal lesion, or hydronephrosis. Bladder is unremarkable. Stomach/Bowel: Stomach is within normal limits. Appendix appears normal. High density material is visualized throughout the colon. Pan colonic diverticula without surrounding inflammatory changes. No evidence of acute intraluminal hemorrhage. No evidence of bowel wall thickening, distention, or inflammatory changes. Lymphatic: No abdominopelvic lymphadenopathy. Reproductive: Uterus and bilateral adnexa are unremarkable. Other: Unchanged small umbilical hernia containing omental fat. No abdominopelvic ascites. Musculoskeletal: No acute or significant osseous findings. IMPRESSION: VASCULAR 1. No evidence of acute intraluminal gastrointestinal hemorrhage. There is high density material throughout the colon, absent in the distal small bowel. This likely represents iodinated contrast from slow/prior hemorrhage into the bowel in the region of the cecum, compatible with findings on prior tagged red blood cell nuclear medicine study. Colonoscopic evaluation could be considered. 2. 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 3. Right greater than left bilateral renal artery fibromuscular dysplasia. 4.  Aortoiliac atherosclerosis (ICD10-I70.0). NON-VASCULAR 1. Pan colonic diverticulosis without evidence of diverticulitis. 2. Similar appearing bibasilar subsegmental atelectasis, left greater than right. DRuthann Cancer MD [facility]  Electronically Signed   By: DRuthann CancerM.D.   On: 06/05/2022 11:11     Medical Consultants:   None.   Subjective:    AJIL PENLANDdenies any pain continues to have bloody bowel movements.  Objective:    Vitals:   06/06/22  1640 06/06/22 1937 06/06/22 2309 06/07/22 0418  BP: 119/62 109/64 (!) 114/55 122/69  Pulse: (!) 112 88 86 85  Resp: '19 17 20 17  '$ Temp: 98.5 F (36.9 C) 98 F (36.7 C) 98.4 F (36.9 C) 98.7 F (37.1 C)  TempSrc: Oral Oral Oral Oral  SpO2: 99% 100% 98% 99%   SpO2: 99 % O2 Flow Rate (L/min): 4 L/min   Intake/Output Summary (Last 24 hours) at 06/07/2022 0954 Last data filed at 06/06/2022 1200 Gross per 24 hour  Intake 298.19 ml  Output --  Net 298.19 ml   There were no vitals filed for this visit.  Exam: General exam: In no acute distress. Respiratory system: Good air movement and clear to auscultation. Cardiovascular system: S1 & S2 heard, RRR. No JVD. Gastrointestinal system: Abdomen is nondistended, soft and nontender.  Extremities: No pedal edema. Skin: No rashes, lesions or ulcers Psychiatry: Judgement and insight appear normal. Mood & affect appropriate.    Data Reviewed:    Labs: Basic Metabolic Panel: Recent Labs  Lab 06/03/22 0341 06/04/22 0113 06/05/22 0953 06/06/22 0740 06/07/22 0301  NA 142 138 140 141 140  K 4.3 3.9 3.7 3.7 3.7  CL 116* 114* 113* 113* 111  CO2 17* 21* 23 21* 25  GLUCOSE 201* 188* 191* 114* 154*  BUN 34* 30* '20 13 12  '$ CREATININE 1.79* 1.83* 1.60* 1.54* 1.63*  CALCIUM 7.8* 7.5* 7.9* 8.4* 7.8*   GFR CrCl cannot be calculated (Unknown ideal weight.). Liver Function Tests: Recent Labs  Lab 06/02/22 1100 06/04/22 0113  AST 20 13*  ALT 15 10  ALKPHOS 58 28*  BILITOT 0.4 1.5*  PROT 5.6* 3.2*  ALBUMIN 2.7* 1.8*   No results for input(s): "LIPASE", "AMYLASE" in the last 168 hours. No results for input(s): "AMMONIA" in the last 168 hours. Coagulation profile Recent Labs  Lab 06/02/22 1100  06/03/22 0341 06/03/22 1128  INR 1.3* 1.3* 1.2   COVID-19 Labs  No results for input(s): "DDIMER", "FERRITIN", "LDH", "CRP" in the last 72 hours.  Lab Results  Component Value Date   SARSCOV2NAA NEGATIVE 10/28/2021   SARSCOV2NAA NEGATIVE 10/23/2020   [place] NEGATIVE 02/12/2020    CBC: Recent Labs  Lab 06/02/22 1100 06/02/22 1812 06/03/22 1128 06/03/22 1648 06/04/22 1650 06/05/22 0114 06/05/22 0953 06/05/22 1632 06/06/22 1106 06/06/22 1643  WBC 7.3   < > 10.8*   < > 7.9  --  9.8 8.8 7.5 10.6*  NEUTROABS 3.6  --  6.2  --   --   --   --   --   --   --   HGB 8.9*   < > 8.6*   < > 8.2* 6.9* 10.2* 10.3* 10.3* 11.0*  HCT 28.2*   < > 25.0*   < > 23.2* 19.8* 29.3* 28.9* 28.8* 32.4*  MCV 86.8   < > 84.5   < > 83.5  --  86.2 85.0 85.5 88.8  PLT 296   < > 154   < > 103*  --  98* 112* 131* 160   < > = values in this interval not displayed.   Cardiac Enzymes: No results for input(s): "CKTOTAL", "CKMB", "CKMBINDEX", "TROPONINI" in the last 168 hours. BNP (last 3 results) No results for input(s): "PROBNP" in the last 8760 hours. CBG: Recent Labs  Lab 06/06/22 0729 06/06/22 1150 06/06/22 1642 06/06/22 2115 06/07/22 0535  GLUCAP 117* 113* 162* 276* 130*   D-Dimer: No results for input(s): "DDIMER" in the last 72  hours. Hgb A1c: No results for input(s): "HGBA1C" in the last 72 hours. Lipid Profile: No results for input(s): "CHOL", "HDL", "LDLCALC", "TRIG", "CHOLHDL", "LDLDIRECT" in the last 72 hours. Thyroid function studies: No results for input(s): "TSH", "T4TOTAL", "T3FREE", "THYROIDAB" in the last 72 hours.  Invalid input(s): "FREET3" Anemia work up: No results for input(s): "VITAMINB12", "FOLATE", "FERRITIN", "TIBC", "IRON", "RETICCTPCT" in the last 72 hours. Sepsis Labs: Recent Labs  Lab 06/05/22 0953 06/05/22 1632 06/06/22 1106 06/06/22 1643  WBC 9.8 8.8 7.5 10.6*   Microbiology No results found for this or any previous visit (from the past 240  hour(s)).   Medications:    atorvastatin  20 mg Oral Daily   bisacodyl  10 mg Rectal Once   budesonide  0.25 mg Inhalation BID   Chlorhexidine Gluconate Cloth  6 each Topical Daily   feeding supplement (GLUCERNA SHAKE)  237 mL Oral TID BM   insulin aspart  0-5 Units Subcutaneous QHS   insulin aspart  0-9 Units Subcutaneous TID WC   lidocaine  1 patch Transdermal Q24H   melatonin  5 mg Oral QPM   multivitamin with minerals  1 tablet Oral Daily   pantoprazole  40 mg Oral BID   sodium chloride flush  3 mL Intravenous Q12H   Continuous Infusions:  lactated ringers 100 mL/hr at 06/06/22 1300      LOS: 5 days   Charlynne Cousins  [facility]  06/07/2022, 9:54 AM

## 2022-06-07 NOTE — Progress Notes (Signed)
Lower dentures were found in patient bed, beside the patient. Patient has both upper and lower dentures in her possession at this time.

## 2022-06-08 ENCOUNTER — Inpatient Hospital Stay (HOSPITAL_COMMUNITY): Payer: Medicare PPO

## 2022-06-08 ENCOUNTER — Encounter (HOSPITAL_COMMUNITY): Payer: Self-pay | Admitting: Internal Medicine

## 2022-06-08 DIAGNOSIS — I10 Essential (primary) hypertension: Secondary | ICD-10-CM | POA: Diagnosis not present

## 2022-06-08 DIAGNOSIS — K922 Gastrointestinal hemorrhage, unspecified: Secondary | ICD-10-CM | POA: Diagnosis not present

## 2022-06-08 DIAGNOSIS — N189 Chronic kidney disease, unspecified: Secondary | ICD-10-CM | POA: Diagnosis not present

## 2022-06-08 DIAGNOSIS — N184 Chronic kidney disease, stage 4 (severe): Secondary | ICD-10-CM | POA: Diagnosis not present

## 2022-06-08 HISTORY — PX: IR IVC FILTER PLMT / S&I /IMG GUID/MOD SED: IMG701

## 2022-06-08 LAB — GLUCOSE, CAPILLARY
Glucose-Capillary: 149 mg/dL — ABNORMAL HIGH (ref 70–99)
Glucose-Capillary: 149 mg/dL — ABNORMAL HIGH (ref 70–99)
Glucose-Capillary: 241 mg/dL — ABNORMAL HIGH (ref 70–99)
Glucose-Capillary: 139 mg/dL — ABNORMAL HIGH (ref 70–99)

## 2022-06-08 LAB — BASIC METABOLIC PANEL
Anion gap: 4 — ABNORMAL LOW (ref 5–15)
BUN: 12 mg/dL (ref 8–23)
CO2: 27 mmol/L (ref 22–32)
Calcium: 7.7 mg/dL — ABNORMAL LOW (ref 8.9–10.3)
Chloride: 111 mmol/L (ref 98–111)
Creatinine, Ser: 1.76 mg/dL — ABNORMAL HIGH (ref 0.44–1.00)
GFR, Estimated: 30 mL/min — ABNORMAL LOW (ref >60)
Glucose, Bld: 164 mg/dL — ABNORMAL HIGH (ref 70–99)
Potassium: 3.4 mmol/L — ABNORMAL LOW (ref 3.5–5.1)
Sodium: 142 mmol/L (ref 135–145)

## 2022-06-08 LAB — CBC
HCT: 31 % — ABNORMAL LOW (ref 36.0–46.0)
Hemoglobin: 10.6 g/dL — ABNORMAL LOW (ref 12.0–15.0)
MCH: 30.5 pg (ref 26.0–34.0)
MCHC: 34.2 g/dL (ref 30.0–36.0)
MCV: 89.1 fL (ref 80.0–100.0)
Platelets: 173 10*3/uL (ref 150–400)
RBC: 3.48 MIL/uL — ABNORMAL LOW (ref 3.87–5.11)
RDW: 15.4 % (ref 11.5–15.5)
WBC: 8.2 10*3/uL (ref 4.0–10.5)
nRBC: 0 % (ref 0.0–0.2)

## 2022-06-08 MED ORDER — MIDAZOLAM HCL 2 MG/2ML IJ SOLN
INTRAMUSCULAR | Status: AC | PRN
  Administered 2022-06-08: 1 mg via INTRAVENOUS
  Administered 2022-06-08: .5 mg via INTRAVENOUS

## 2022-06-08 MED ORDER — FENTANYL CITRATE (PF) 100 MCG/2ML IJ SOLN
INTRAMUSCULAR | Status: AC | PRN
  Administered 2022-06-08: 50 ug via INTRAVENOUS
  Administered 2022-06-08: 25 ug via INTRAVENOUS

## 2022-06-08 MED ORDER — FENTANYL CITRATE (PF) 100 MCG/2ML IJ SOLN
INTRAMUSCULAR | Status: AC
  Filled 2022-06-08: qty 2

## 2022-06-08 MED ORDER — LIDOCAINE HCL 1 % IJ SOLN
INTRAMUSCULAR | Status: AC
  Filled 2022-06-08: qty 20

## 2022-06-08 MED ORDER — ONDANSETRON HCL 4 MG/2ML IJ SOLN
INTRAMUSCULAR | Status: AC
  Filled 2022-06-08: qty 2

## 2022-06-08 MED ORDER — MIDAZOLAM HCL 2 MG/2ML IJ SOLN
INTRAMUSCULAR | Status: AC
  Filled 2022-06-08: qty 2

## 2022-06-08 NOTE — Procedures (Signed)
Interventional Radiology Procedure Note  Procedure: IVC filter placement  Access: Right IJ vein  Contrast: CO2  Complications: None  Estimated Blood Loss: < 10 mL  Findings: IVC normally patent. [place] IVC filter deployed in infrarenal IVC.   Venetia Night. Kathlene Cote, M.D Pager:  [phone]

## 2022-06-08 NOTE — Sedation Documentation (Signed)
Patient has sudden onset of nausea with small amount of clear emesis. Patient states " I get like this from time to time with clear water coming from my mouth". Patient has PRN zofran ordered. 4 mg to be given IV

## 2022-06-08 NOTE — Evaluation (Signed)
Physical Therapy Evaluation Patient Details Name: [name] MRN: [id number] DOB: 04/22/1951 Today's Date: 06/08/2022  History of Present Illness  [name] is an 71 y.o. female admitted 8/6 who comes in for lower GI bleed while on Eliquis related she was off balance weak and dizzy had about 4 episodes of gushing blood in the bathroom.  On arrival to the ED her hemoglobin was 4.5 was given 5 units of packed red blood cells. CT angio of the abdomen and pelvis revealed no source of bleeding.  PCCM was consulted for ongoing GI bleed and hemodynamic instability. IVC filter placed 8/12.    PMH:  cardiac arrest in January 2023, stage IV chronic kidney disease, diabetes mellitus type 2, hyperlipidemia, recently discharged from the hospital on 12/04/2021 for V-fib arrest due to PE underwent mechanical thrombectomy due to massive PE, hospitalization was complicated by intrahepatic/intraperitoneal hemorrhage with DIC, discharged to CIR and remained there until 313 12/17/2021, rehospitalized again on 01/09/2022 to 01/16/2022 for refractory hypoglycemia, discharged on 02/22/2028 for lower GI bleed from internal hemorrhoids  Clinical Impression  Pt admitted with above diagnosis. Pt was able to ambulate with RW needing min assist. States that daughter can help at all times at home and she will need assist as she has difficulty with transitions. Pt reports she wants a new RW as he insurance has never covered equipment and that equipment she has currently was picked up at yard sales. Pt should progress well with therapy and 24 hour care intiially.   Pt currently with functional limitations due to the deficits listed below (see PT Problem List). Pt will benefit from skilled PT to increase their independence and safety with mobility to allow discharge to the venue listed below.          Recommendations for follow up therapy are one component of a multi-disciplinary discharge planning process, led by the attending  physician.  Recommendations may be updated based on patient status, additional functional criteria and insurance authorization.  Follow Up Recommendations Home health PT      Assistance Recommended at Discharge Intermittent Supervision/Assistance  Patient can return home with the following  A little help with walking and/or transfers;A little help with bathing/dressing/bathroom;Assistance with cooking/housework;Assist for transportation;Help with stairs or ramp for entrance    Equipment Recommendations Rolling walker (2 wheels)  Recommendations for Other Services       Functional Status Assessment Patient has had a recent decline in their functional status and demonstrates the ability to make significant improvements in function in a reasonable and predictable amount of time.     Precautions / Restrictions Precautions Precautions: Fall Restrictions Weight Bearing Restrictions: No      Mobility  Bed Mobility Overal bed mobility: Needs Assistance Bed Mobility: Supine to Sit, Sit to Supine     Supine to sit: Min guard, Min assist Sit to supine: Min assist   General bed mobility comments: needed a little help with feet in and out of bed.  Incr time as pt fatigued.  Wanted to get to [facility] commode.    Transfers Overall transfer level: Needs assistance Equipment used: Rolling walker (2 wheels) Transfers: Sit to/from Stand, Bed to chair/wheelchair/BSC Sit to Stand: Mod assist   Step pivot transfers: Min assist       General transfer comment: Mod assist to come to standing to RW. Min assist to pivot to 3N1.  Pt was able to clean herself and needed min assist to stand from commode as well.  Ambulation/Gait Ambulation/Gait assistance: Min assist Gait Distance (Feet): 25 Feet Assistive device: Rolling walker (2 wheels) Gait Pattern/deviations: Step-through pattern, Decreased stride length, Trunk flexed, Drifts right/left   Gait velocity interpretation: <1.31 ft/sec,  indicative of household ambulator   General Gait Details: Pt needed min assist to ambulate to sink and wash face and hands.  Pt needed min assist to steady while at sink.  Then used RW to walk back to bed.  RElies on external support and RW.  Stairs            Wheelchair Mobility    Modified Rankin (Stroke Patients Only)       Balance Overall balance assessment: Needs assistance Sitting-balance support: No upper extremity supported, Feet supported Sitting balance-Leahy Scale: Fair     Standing balance support: Bilateral upper extremity supported, During functional activity Standing balance-Leahy Scale: Poor Standing balance comment: relies on external support and RW                             Pertinent Vitals/Pain Pain Assessment Pain Assessment: Faces Faces Pain Scale: Hurts even more Pain Location: right foot    Home Living Family/patient expects to be discharged to:: Private residence Living Arrangements: Children Available Help at Discharge: Family;Available 24 hours/day Type of Home: House Home Access: Stairs to enter Entrance Stairs-Rails: None Entrance Stairs-Number of Steps: 1   Home Layout: Two level;Able to live on main level with bedroom/bathroom;1/2 bath on main level (shower in daughters bedroom on main level) Home Equipment: Shower seat;Cane - quad;BSC/3in1;Hand held Engineering geologist (2 wheels);Wheelchair - manual Additional Comments: pt reports she has fallen 6 x since being at daughters home but she didnt have the RW.  She states she uses wheelchair at times.    Prior Function Prior Level of Function : Needs assist             Mobility Comments: using RW most of time ADLs Comments: some assist for ADLS as needed, daughter helps with bathing     Hand Dominance   Dominant Hand: Right    Extremity/Trunk Assessment   Upper Extremity Assessment Upper Extremity Assessment: Defer to OT evaluation    Lower Extremity  Assessment Lower Extremity Assessment: Generalized weakness    Cervical / Trunk Assessment Cervical / Trunk Assessment: Kyphotic  Communication   Communication: No difficulties  Cognition Arousal/Alertness: Awake/alert Behavior During Therapy: WFL for tasks assessed/performed Overall Cognitive Status: Within Functional Limits for tasks assessed                                          General Comments      Exercises General Exercises - Lower Extremity Ankle Circles/Pumps: AROM, Both, 10 reps, Supine Long Arc Quad: AROM, Both, 10 reps, Seated   Assessment/Plan    PT Assessment Patient needs continued PT services  PT Problem List Decreased activity tolerance;Decreased balance;Decreased mobility;Decreased knowledge of use of DME;Decreased safety awareness;Decreased knowledge of precautions       PT Treatment Interventions DME instruction;Gait training;Stair training;Functional mobility training;Therapeutic activities;Therapeutic exercise;Balance training;Patient/family education    PT Goals (Current goals can be found in the Care Plan section)  Acute Rehab PT Goals Patient Stated Goal: to go home PT Goal Formulation: With patient Time For Goal Achievement: 06/22/22 Potential to Achieve Goals: Good    Frequency Min 3X/week  Co-evaluation               AM-PAC PT "6 Clicks" Mobility  Outcome Measure Help needed turning from your back to your side while in a flat bed without using bedrails?: A Little Help needed moving from lying on your back to sitting on the side of a flat bed without using bedrails?: A Little Help needed moving to and from a bed to a chair (including a wheelchair)?: A Little Help needed standing up from a chair using your arms (e.g., wheelchair or bedside chair)?: A Lot Help needed to walk in hospital room?: A Little Help needed climbing 3-5 steps with a railing? : A Little 6 Click Score: 17    End of Session Equipment  Utilized During Treatment: Gait belt Activity Tolerance: Patient limited by fatigue Patient left: in bed;with call bell/phone within reach;with bed alarm set Nurse Communication: Mobility status PT Visit Diagnosis: Muscle weakness (generalized) (M62.81);Unsteadiness on feet (R26.81)    Time: 7543-6067 PT Time Calculation (min) (ACUTE ONLY): 35 min   Charges:   PT Evaluation $PT Eval Moderate Complexity: 1 Mod PT Treatments $Gait Training: 8-22 mins        [facility] M,PT Acute Rehab Services [place] 06/08/2022, 4:39 PM

## 2022-06-08 NOTE — Consult Note (Signed)
Chief Complaint: Patient was seen in consultation today for IVC filter placement Chief Complaint  Patient presents with   GI Bleeding   at the request of Charlynne Cousins   Referring Physician(s): Charlynne Cousins   Supervising Physician: Aletta Edouard  Patient Status: [facility] - In-pt  History of Present Illness: [name] is a 71 y.o. female with PMHs of HTN, HLD, DM, CKD, massive PE and cardiac arrest in January 2023, DIC, acute hepatic hemorrhage, acute renal failure, who is currently admitted due to GIB, she presents today for IVC filter placement.   Patient is well known to IR service since January 2023.   She presented with massive PE with cardiac arrest which was further complicated by DIC manifested by hematoma around her CVL and upper GI bleeding via NGT, underwent mechanical thrombectomy by Dr. Ky Barban- Abd on 10/29/21. Her recovery was further complicated by acute hepatic hemorrhage, she underwent emergent hepatic artery embolization with Dr. Dwaine Gale on 11/01/21.  She also developed acute renal failure and underwent tunneled dialysis catheter placement by Dr. Earleen Newport on 11/20/21, renal function recovered and the catheter was removed on 12/20/21. She also underwent bone marrow biopsy on 11/26/21, performed by Dr. Serafina Royals. She was discharged on 01/16/22 after 2 months of hospitalization, she was started on Eliquis.   She was again hospitalized from 4/24 to 4/29 due to concern for lower GIB, it was found that the bleeding was from internal hemorrhoids. She again presented to ED on 8/6 due to hematochezia, she was tachycardic and hypotensive, hgb was 4.5. she was given blood transfusion and Eliquis was reversed, admitted for further eval and management. Patient underwent US doppler study on 06/07/22 which showed chronic deep vein thrombosis involving the left common femoral vein and left femoral vein which essentially unchanged compared to previous examination on 05/01/22.  NM on 8/6 and  CTA on 8/7 and 8/9 did not show site of GIB, colonoscopy on 8/10 showed no bleeding. Patient was deemed not a candidate for anticoagulation, therefor IVC filter placement was recommended to the patient which she agrees to proceed.   Case was reviewed by Dr. Kathlene Cote, originally thought that she would not benefit from IVC filter placement as she has chronic small DVT in her left leg which has not changed since July 2023. After further discussion with Dr. Aileen Fass, decision was made to proceed with IVC filter placement due to hx of massive PE with complications such as DIC and development of acute symptomatic blood loss amenia due to GIB while on Eliquis.    Patient laying in bed, not in acute distress.  She was slightly upset because " you are moving too fast, you don't give me enough time to think about anything."  She stated that she was aware of possibility of IVC filter placement but did not think it will be done today.  She asks if IVC filter will stop her from bleeding. Informed the patient that IVC filter will not directly prevent her from developing another bleeding but it will certainly decrease chance of bleeding as she will not need to be on blood thinner after IVC filter is placed. She verbalized understanding.  Denise headache, fever, chills, shortness of breath, cough, chest pain, abdominal pain, nausea ,vomiting, and bleeding.   Past Medical History:  Diagnosis Date   Anxiety    Arthritis    Asthma    Cardiac arrest ([place]) 10/28/2021   Depression    Diabetes mellitus without complication ([place])  Fibromyalgia    GERD (gastroesophageal reflux disease)    Hyperlipemia    Hypertension    PONV (postoperative nausea and vomiting)     Past Surgical History:  Procedure Laterality Date   COLONOSCOPY     COLONOSCOPY WITH PROPOFOL N/A 02/20/2022   Procedure: COLONOSCOPY WITH PROPOFOL;  Surgeon: Otis Brace, MD;  Location: [place];  Service: Gastroenterology;  Laterality:  N/A;   DILATION AND CURETTAGE OF UTERUS     ESOPHAGOGASTRODUODENOSCOPY N/A 02/20/2022   Procedure: ESOPHAGOGASTRODUODENOSCOPY (EGD);  Surgeon: Otis Brace, MD;  Location: [facility] ENDOSCOPY;  Service: Gastroenterology;  Laterality: N/A;   IR EMBO ART  VEN HEMORR LYMPH EXTRAV  INC GUIDE ROADMAPPING  11/01/2021   IR FLUORO GUIDE CV LINE RIGHT  11/20/2021   IR REMOVAL TUN CV CATH W/O FL  12/20/2021   IR THROMBECT PRIM MECH INIT (INCLU) MOD SED  10/29/2021   IR US GUIDE VASC ACCESS RIGHT  10/29/2021   IR US GUIDE VASC ACCESS RIGHT  11/01/2021   IR US GUIDE VASC ACCESS RIGHT  11/21/2021   KNEE ARTHROSCOPY  [id number]   left   POLYPECTOMY  02/20/2022   Procedure: POLYPECTOMY;  Surgeon: Otis Brace, MD;  Location: [place];  Service: Gastroenterology;;   SHOULDER ARTHROSCOPY     left   TRIGGER FINGER RELEASE  08/13/2012   Procedure: RELEASE TRIGGER FINGER/A-1 PULLEY;  Surgeon: [name]., MD;  Location: [place];  Service: Orthopedics;  Laterality: Right;  long finger   TUBAL LIGATION      Allergies: Darvon [propoxyphene], Hydrocodone bit-homatrop mbr, Metformin, Pentazocine, Percocet [oxycodone-acetaminophen], Sulfa antibiotics, Tetracaine hcl, Tetracyclines & related, Tramadol, Benadryl [diphenhydramine], Other, and Aspirin  Medications: Prior to Admission medications   Medication Sig Start Date End Date Taking? Authorizing Provider  acetaminophen (TYLENOL) 325 MG tablet Take 2 tablets (650 mg total) by mouth every 4 (four) hours as needed for fever (greater than 37.6 C). Patient taking differently: Take 650 mg by mouth daily as needed (pain). 01/07/22  Yes Angiulli, Lavon Paganini, PA-C  albuterol (VENTOLIN HFA) 108 (90 Base) MCG/ACT inhaler Inhale 1-2 puffs into the lungs every 4 (four) hours as needed for wheezing or shortness of breath. Patient taking differently: Inhale 2 puffs into the lungs daily as needed for wheezing or shortness of breath. 02/14/22  Yes  Medina-Vargas, Monina C, NP  amLODipine (NORVASC) 10 MG tablet Take 10 mg by mouth daily. 03/06/22  Yes [provider]  apixaban (ELIQUIS) 5 MG TABS tablet Take 1 tablet (5 mg total) by mouth 2 (two) times daily. 02/14/22  Yes Medina-Vargas, Monina C, NP  atorvastatin (LIPITOR) 20 MG tablet Take 1 tablet (20 mg total) by mouth daily. 02/14/22  Yes Medina-Vargas, Monina C, NP  B Complex-C (B-COMPLEX WITH VITAMIN C) tablet Take 1 tablet by mouth daily. 01/08/22  Yes Angiulli, Lavon Paganini, PA-C  dicyclomine (BENTYL) 10 MG capsule Take 1 capsule (10 mg total) by mouth 3 (three) times daily before meals. Patient taking differently: Take 10 mg by mouth 2 (two) times daily as needed (stomach cramps). 02/14/22  Yes Medina-Vargas, Monina C, NP  Ferrous Sulfate (IRON PO) Take 1 tablet by mouth daily.   Yes [provider]  FLOVENT HFA 220 MCG/ACT inhaler Inhale 2 puffs into the lungs as needed (wheezing/SOB). 03/01/22  Yes [provider]  Glucerna (GLUCERNA) LIQD Take 237 mLs by mouth 2 (two) times daily between meals.   Yes [provider]  lidocaine (LIDODERM) 5 % Place 2  patches onto the skin daily. Remove & Discard patch within 12 hours or as directed by MD Patient taking differently: Place 1 patch onto the skin daily as needed (pain). Remove & Discard patch within 12 hours or as directed by MD 02/14/22  Yes Medina-Vargas, Monina C, NP  ondansetron (ZOFRAN) 4 MG tablet Take 4 mg by mouth as needed for nausea or vomiting. 06/01/22  Yes [provider]  oxyCODONE (OXY IR/ROXICODONE) 5 MG immediate release tablet Take 5 mg by mouth daily as needed for severe pain.   Yes [provider]  pantoprazole (PROTONIX) 40 MG tablet Take 1 tablet (40 mg total) by mouth 2 (two) times daily. 02/14/22  Yes Medina-Vargas, Monina C, NP  simvastatin (ZOCOR) 40 MG tablet Take 40 mg by mouth daily.   Yes [provider]  Vitamin D, Ergocalciferol, (DRISDOL) 1.25 MG (50000  UNIT) CAPS capsule Take 1 capsule (50,000 Units total) by mouth once a week. 01/07/22  Yes Angiulli, Lavon Paganini, PA-C  DULoxetine (CYMBALTA) 20 MG capsule Take 1 capsule (20 mg total) by mouth daily. Patient not taking: Reported on 06/02/2022 02/14/22   Medina-Vargas, Monina C, NP  folic acid (FOLVITE) 1 MG tablet Take 2 tablets (2 mg total) by mouth daily. Patient not taking: Reported on 06/02/2022 02/14/22   Medina-Vargas, Monina C, NP  melatonin 5 MG TABS Take 1 tablet (5 mg total) by mouth every evening. Patient not taking: Reported on 06/02/2022 02/14/22   Medina-Vargas, Monina C, NP  PARoxetine (PAXIL) 40 MG tablet Take by mouth daily. Patient not taking: Reported on 06/02/2022 03/06/22   [provider]  temazepam (RESTORIL) 7.5 MG capsule Take 1 capsule (7.5 mg total) by mouth at bedtime as needed for sleep. Patient not taking: Reported on 06/02/2022 03/20/22   Volanda Napoleon, MD     Family History  Problem Relation Age of Onset   Diabetes Mother    Heart attack Father    Diabetes Sister    Throat cancer Brother    Hypertension Daughter     Social History   Socioeconomic History   Marital status: Divorced    Spouse name: Not on file   Number of children: Not on file   Years of education: Not on file   Highest education level: Not on file  Occupational History   Not on file  Tobacco Use   Smoking status: Former    Packs/day: 0.50    Years: 20.00    Total pack years: 10.00    Types: Cigarettes    Quit date: 08/10/2010    Years since quitting: 11.8   Smokeless tobacco: Never  Vaping Use   Vaping Use: Never used  Substance and Sexual Activity   Alcohol use: No   Drug use: No   Sexual activity: Not on file  Other Topics Concern   Not on file  Social History Narrative   Not on file   Social Determinants of Health   Financial Resource Strain: Not on file  Food Insecurity: Not on file  Transportation Needs: Not on file  Physical Activity: Not on file  Stress: Not on  file  Social Connections: Not on file     Review of Systems: A 12 point ROS discussed and pertinent positives are indicated in the HPI above.  All other systems are negative.  Vital Signs: BP 121/80 (BP Location: Left Arm)   Pulse 75   Temp 97.7 F (36.5 C) (Oral)   Resp 16   SpO2  100%   Physical Exam Vitals and nursing note reviewed.  Constitutional:      General: Patient is not in acute distress.    Appearance: Normal appearance. Patient is not ill-appearing.  HENT:     Head: Normocephalic and atraumatic.     Mouth/Throat:     Mouth: Mucous membranes are moist.     Pharynx: Oropharynx is clear.  Cardiovascular:     Rate and Rhythm: Normal rate and regular rhythm.     Pulses: Normal pulses.     Heart sounds: Normal heart sounds.  Pulmonary:     Effort: Pulmonary effort is normal.     Breath sounds: Normal breath sounds.  Abdominal:     General: Abdomen is flat. Bowel sounds are normal.     Palpations: Abdomen is soft.  Musculoskeletal:     Cervical back: Neck supple.  Skin:    General: Skin is warm and dry.     Coloration: Skin is not jaundiced or pale.  Neurological:     Mental Status: Patient is alert and oriented to person, place, and time.  Psychiatric:        Mood and Affect: Mood normal.        Behavior: Behavior normal.        Judgment: Judgment normal.     MD Evaluation Airway: WNL Heart: WNL Abdomen: WNL Chest/ Lungs: WNL ASA  Classification: 3 Mallampati/Airway Score: Two  Imaging: VAS [name] LOWER EXTREMITY VENOUS (DVT)  Result Date: 06/07/2022  Lower Venous DVT Study Patient Name:  LUDMILA EBARB  Date of Exam:   06/07/2022 Medical Rec #: [id number]        Accession #:    [id number] Date of Birth: 08-22-1951        Patient Gender: F Patient Age:   25 years Exam Location:  [facility] Procedure:      VAS [name] LOWER EXTREMITY VENOUS (DVT) Referring Phys: Bess Harvest ORTIZ  --------------------------------------------------------------------------------  Indications: Hx of DVT, and Swelling.  Comparison Study: Previous exam dated 05/01/22, negative for acute DVT                   bilateraly, positive for chronic DVT on the left in the common                   and proximal femoral vein segments. Performing Technologist: Bobetta Lime BS, RVT  Examination Guidelines: A complete evaluation includes B-mode imaging, spectral Doppler, color Doppler, and power Doppler as needed of all accessible portions of each vessel. Bilateral testing is considered an integral part of a complete examination. Limited examinations for reoccurring indications may be performed as noted. The reflux portion of the exam is performed with the patient in reverse Trendelenburg.  +---------+---------------+---------+-----------+----------+--------------+ RIGHT    CompressibilityPhasicitySpontaneityPropertiesThrombus Aging +---------+---------------+---------+-----------+----------+--------------+ CFV      Full           Yes      Yes                                 +---------+---------------+---------+-----------+----------+--------------+ SFJ      Full                                                        +---------+---------------+---------+-----------+----------+--------------+  FV Prox  Full                                                        +---------+---------------+---------+-----------+----------+--------------+ FV Mid   Full                                                        +---------+---------------+---------+-----------+----------+--------------+ FV DistalFull                                                        +---------+---------------+---------+-----------+----------+--------------+ PFV      Full                                                        +---------+---------------+---------+-----------+----------+--------------+ POP      Full            Yes      Yes                                 +---------+---------------+---------+-----------+----------+--------------+ PTV      Full                                                        +---------+---------------+---------+-----------+----------+--------------+ PERO     Full                                                        +---------+---------------+---------+-----------+----------+--------------+   +---------+---------------+---------+-----------+----------+--------------+ LEFT     CompressibilityPhasicitySpontaneityPropertiesThrombus Aging +---------+---------------+---------+-----------+----------+--------------+ CFV      Partial        Yes      Yes                  Chronic        +---------+---------------+---------+-----------+----------+--------------+ SFJ      Full                                                        +---------+---------------+---------+-----------+----------+--------------+ FV Prox  Partial                                      Chronic        +---------+---------------+---------+-----------+----------+--------------+ FV Mid  Full                                                        +---------+---------------+---------+-----------+----------+--------------+ FV DistalFull                                                        +---------+---------------+---------+-----------+----------+--------------+ PFV      Full                                                        +---------+---------------+---------+-----------+----------+--------------+ POP      Full                                                        +---------+---------------+---------+-----------+----------+--------------+ PTV      Full                                                        +---------+---------------+---------+-----------+----------+--------------+ PERO     Full                                                         +---------+---------------+---------+-----------+----------+--------------+     Summary: BILATERAL: -No evidence of popliteal cyst, bilaterally. RIGHT: - No evidence of deep vein thrombosis in the lower extremity. No indirect evidence of obstruction proximal to the inguinal ligament.  LEFT: - Findings consistent with chronic deep vein thrombosis involving the left common femoral vein, and left femoral vein. - Findings appear essentially unchanged compared to previous examination.  *See table(s) above for measurements and observations.    Preliminary    CT Angio Abd/Pel w/ and/or w/o  Result Date: 06/05/2022 CLINICAL DATA:  71 year old female with concern for lower gastrointestinal hemorrhage. EXAM: CT ANGIOGRAPHY ABDOMEN AND PELVIS WITH CONTRAST AND WITHOUT CONTRAST TECHNIQUE: Multidetector CT imaging of the abdomen and pelvis was performed using the standard protocol during bolus administration of intravenous contrast. Multiplanar reconstructed images and MIPs were obtained and reviewed to evaluate the vascular anatomy. RADIATION DOSE REDUCTION: This exam was performed according to the departmental dose-optimization program which includes automated exposure control, adjustment of the mA and/or kV according to patient size and/or use of iterative reconstruction technique. CONTRAST:  19m OMNIPAQUE IOHEXOL 350 MG/ML SOLN COMPARISON:  06/02/2022, 06/03/2022 FINDINGS: VASCULAR Aorta: Atherosclerosis without signficant stenosis, dissection, or aneurysm. Celiac: Patent without evidence of aneurysm, dissection, vasculitis or significant stenosis. SMA: Patent without evidence of aneurysm, dissection, vasculitis or significant stenosis. Renals: Single bilateral renal arteries which are patent. Mild corrugated  appearance of the mid bilateral, right greater than left renal arteries. IMA: High-grade ostial stenosis secondary to atherosclerotic plaque with mild poststenotic ectasia. Patent distally. Inflow: Patent  without evidence of aneurysm, dissection, vasculitis or significant stenosis. Proximal Outflow: Bilateral common femoral and visualized portions of the superficial and profunda femoral arteries are patent without evidence of aneurysm, dissection, vasculitis or significant stenosis. Veins: The hepatic veins are widely patent. The portal system is widely patent and normal in caliber. The renal veins are patent bilaterally in standard anatomic configuration. No evidence of iliocaval thrombosis or anomaly. Review of the MIP images confirms the above findings. NON-VASCULAR Lower chest: Similar appearing left greater than right bibasilar subsegmental atelectasis. The heart is normal in size. No pericardial effusion. Hepatobiliary: Similar appearing left lobe, partially exophytic simple appearing cyst measuring up to approximately 5.2 cm. The remaining liver is within normal limits of size and morphology. The gallbladder is present with vicarious excretion of contrast material. No intra or extrahepatic biliary ductal dilation. Pancreas: Unremarkable. No pancreatic ductal dilatation or surrounding inflammatory changes. Spleen: Normal in size without focal abnormality. Adrenals/Urinary Tract: Adrenal glands are unremarkable. Kidneys are normal, without renal calculi, focal lesion, or hydronephrosis. Bladder is unremarkable. Stomach/Bowel: Stomach is within normal limits. Appendix appears normal. High density material is visualized throughout the colon. Pan colonic diverticula without surrounding inflammatory changes. No evidence of acute intraluminal hemorrhage. No evidence of bowel wall thickening, distention, or inflammatory changes. Lymphatic: No abdominopelvic lymphadenopathy. Reproductive: Uterus and bilateral adnexa are unremarkable. Other: Unchanged small umbilical hernia containing omental fat. No abdominopelvic ascites. Musculoskeletal: No acute or significant osseous findings. IMPRESSION: VASCULAR 1. No evidence of  acute intraluminal gastrointestinal hemorrhage. There is high density material throughout the colon, absent in the distal small bowel. This likely represents iodinated contrast from slow/prior hemorrhage into the bowel in the region of the cecum, compatible with findings on prior tagged red blood cell nuclear medicine study. Colonoscopic evaluation could be considered. 2. 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 3. Right greater than left bilateral renal artery fibromuscular dysplasia. 4.  Aortoiliac atherosclerosis (ICD10-I70.0). NON-VASCULAR 1. Pan colonic diverticulosis without evidence of diverticulitis. 2. Similar appearing bibasilar subsegmental atelectasis, left greater than right. Ruthann Cancer, MD [facility] Radiology Electronically Signed   By: Ruthann Cancer M.D.   On: 06/05/2022 11:11   CT ANGIO GI BLEED  Result Date: 06/03/2022 CLINICAL DATA:  GI bleed EXAM: CTA ABDOMEN AND PELVIS WITHOUT AND WITH CONTRAST TECHNIQUE: Multidetector CT imaging of the abdomen and pelvis was performed using the standard protocol during bolus administration of intravenous contrast. Multiplanar reconstructed images and MIPs were obtained and reviewed to evaluate the vascular anatomy. RADIATION DOSE REDUCTION: This exam was performed according to the departmental dose-optimization program which includes automated exposure control, adjustment of the mA and/or kV according to patient size and/or use of iterative reconstruction technique. CONTRAST:  50m OMNIPAQUE IOHEXOL 350 MG/ML SOLN COMPARISON:  Nuclear medicine GI bleed study 06/02/2022 FINDINGS: VASCULAR Aorta: There is no acute aortic abnormality. There is moderate infrarenal atherosclerosis. Celiac: Patent without evidence of aneurysm, dissection, vasculitis or significant stenosis. SMA: Patent without evidence of aneurysm, dissection, vasculitis or  significant stenosis. Renals: Both renal arteries are patent without evidence of aneurysm, dissection, vasculitis, fibromuscular dysplasia or significant stenosis. IMA: Patent without evidence of aneurysm, dissection, vasculitis or significant stenosis. Inflow: Patent without evidence of aneurysm, dissection, vasculitis or significant stenosis. Proximal Outflow: Bilateral common femoral and visualized  portions of the superficial and profunda femoral arteries are patent without evidence of aneurysm, dissection, vasculitis or significant stenosis. Veins: No obvious venous abnormality within the limitations of this arterial phase study. Review of the MIP images confirms the above findings. NON-VASCULAR Lower chest: Left basilar atelectasis Hepatobiliary: 4.7 cm cystic lesion of the left hepatic lobe. No biliary dilatation. Gallbladder is normal. Pancreas: Normal Spleen: Normal Adrenals/Urinary Tract: Normal adrenal glands and kidneys. Stomach/Bowel: Stomach is within normal limits. Appendix appears normal. No evidence of bowel wall thickening, distention, or inflammatory changes. Lymphatic: No significant vascular findings are present. No enlarged abdominal or pelvic lymph nodes. Reproductive: Uterus and bilateral adnexa are unremarkable. Other: No abdominal wall hernia or abnormality. No abdominopelvic ascites. Musculoskeletal: No acute or significant osseous findings. IMPRESSION: 1. No arterial extravasation or GI bleed source is identified. 2. Aortic Atherosclerosis (ICD10-I70.0). Electronically Signed   By: Ulyses Jarred M.D.   On: 06/03/2022 01:38   NM GI Blood Loss  Result Date: 06/02/2022 CLINICAL DATA:  Gastrointestinal bleeding, history of hemorrhoids EXAM: NUCLEAR MEDICINE GASTROINTESTINAL BLEEDING SCAN TECHNIQUE: Sequential abdominal images were obtained following intravenous administration of Tc-47mlabeled red blood cells. RADIOPHARMACEUTICALS:  22.1 mCi Tc-987mertechnetate in-vitro labeled red cells.  COMPARISON:  CT 12/15/2021 FINDINGS: Anterior planar imaging of the abdomen and pelvis are performed for 2 hours after intravenous administration of radiotracer. Normal physiologic distribution of radiotracer within the vascular structures, liver, and spleen. Excreted radiotracer is seen within the bladder. There is a faint area of increased radiotracer activity identified in the right lower quadrant beginning at 1 hour 10 minutes after initial imaging. This becomes more prominent over the course of the second hour of imaging, but there is no propagation along the expected course of the bowel. This is equivocal for active gastrointestinal bleeding. IMPRESSION: 1. Faint area of increased radiotracer activity localizing to the right lower quadrant during the second hour of imaging, without evidence of propagation to suggest peristalsis. Findings are equivocal for active gastrointestinal bleeding in the region of the distal small bowel or cecum. Critical Value/emergent results were called by telephone at the time of interpretation on 06/02/2022 at 6:02 pm to provider [facility] who verbally acknowledged these results. Electronically Signed   By: MiRanda Ngo.D.   On: 06/02/2022 18:02   DG Chest Portable 1 View  Result Date: 06/02/2022 CLINICAL DATA:  Hypotension and weakness. EXAM: PORTABLE CHEST 1 VIEW COMPARISON:  01/09/2022 FINDINGS: The lungs are clear without focal pneumonia, edema, pneumothorax or pleural effusion. The cardiopericardial silhouette is within normal limits for size. Atelectasis or scarring noted at the left base. The visualized bony structures of the thorax are unremarkable. Telemetry leads overlie the chest. IMPRESSION: No active disease. Electronically Signed   By: ErMisty Stanley.D.   On: 06/02/2022 12:06    Labs:  CBC: Recent Labs    06/05/22 0953 06/05/22 1632 06/06/22 1106 06/06/22 1643  WBC 9.8 8.8 7.5 10.6*  HGB 10.2* 10.3* 10.3* 11.0*  HCT 29.3* 28.9* 28.8* 32.4*   PLT 98* 112* 131* 160    COAGS: Recent Labs    11/07/21 1500 11/07/21 2340 11/19/21 0516 11/19/21 1703 11/20/21 0410 06/02/22 1100 06/03/22 0341 06/03/22 1128  INR 1.2  --   --   --   --  1.3* 1.3* 1.2  APTT 100*   < > 77* 58* 80*  --  30  --    < > = values in this interval not displayed.    BMP: Recent  Labs    06/05/22 0953 06/06/22 0740 06/07/22 0301 06/08/22 0218  NA 140 141 140 142  K 3.7 3.7 3.7 3.4*  CL 113* 113* 111 111  CO2 23 21* 25 27  GLUCOSE 191* 114* 154* 164*  BUN _0 CALCIUM 7.9* 8.4* 7.8* 7.7*  CREATININE 1.60* 1.54* 1.63* 1.76*  GFRNONAA 34* 35* 33* 30*    LIVER FUNCTION TESTS: Recent Labs    03/20/22 1338 04/26/22 0952 06/02/22 1100 06/04/22 0113  BILITOT 0.4 0.4 0.4 1.5*  AST _1 13*  ALT _2 ALKPHOS 64 62 58 28*  PROT 7.1 7.2 5.6* 3.2*  ALBUMIN 3.8 3.6 2.7* 1.8*    TUMOR MARKERS: No results for input(s): "AFPTM", "CEA", "CA199", "CHROMGRNA" in the last 8760 hours.  Assessment and Plan: 71 y.o. female with history of massive PE with cardiac arrest in January 3716 which was complicated by DIC, acute hepatic hemorrhage, and acute renal failure, she is currently hospitalized due to acute symptomatic blood loss anemia due to LGBI while on Eliquis. Patient need life long anticoagulation due to hx of massive PE but cannot tolerate anticoagulation, she is in need of IVC filter.   NPO since MN VSS CBC from 8/10 showed mild leukocytosis 10.6, she has no other s/s of acute infection. STAT CBC ordered, but most likely willl proceed with IVC filter placement today.  BMP today BUN 12, Creatinine 1.76, GFR 30 - may use CO2 instead of contrast    Thank you for this interesting consult.  I greatly enjoyed meeting RASHAE ROTHER and look forward to participating in their care.  A copy of this report was sent to the requesting provider on this date.  Electronically Signed: Tera Mater, PA-C 06/08/2022, 10:15 AM   I  spent a total of 40 Minutes    in face to face in clinical consultation, greater than 50% of which was counseling/coordinating care for IVC filter placement.  This chart was dictated using voice recognition software.  Despite best efforts to proofread,  errors can occur which can change the documentation meaning.

## 2022-06-08 NOTE — Progress Notes (Signed)
[facility] PROGRESS NOTE    Progress Note  [name]  ZOX:[id number] DOB: 06-13-1951 DOA: 06/02/2022 PCP: [name], MD     Brief Narrative:   [name] is an 71 y.o. female past medical history significant for cardiac arrest in January 2023, stage IV chronic kidney disease, diabetes mellitus type 2, hyperlipidemia, recently discharged from the hospital on 12/04/2021 for V-fib arrest due to PE underwent mechanical thrombectomy due to massive PE, hospitalization was complicated by intrahepatic/intraperitoneal hemorrhage with DIC, discharged to CIR and remained there until 313 12/17/2021, rehospitalized again on 01/09/2022 to 01/16/2022 for refractory hypoglycemia, discharged on 02/22/2028 for lower GI bleed from internal hemorrhoids who comes in for lower GI bleed while on Eliquis related she was off balance weak and dizzy had about 4 episodes of gushing blood in the bathroom.  On arrival to the ED her hemoglobin was hemoglobin went down to 4.5 was given 5 units of packed red blood cells blood pressure improved CT angio of the abdomen and pelvis showed revealed no source of bleeding. PCCM was consulted for ongoing GI bleed and hemodynamic instability. Nuclear scan showed increased radioactive tracer localized in the right lower quadrant    Assessment/Plan:   Acute lower GI bleeding probably due to diverticular bleed/acute blood loss anemia/hemorrhagic shock: EGD and colonoscopy in April 2023 showed diverticulosis hemorrhoids mild gastritis. GI was consulted recommended colonoscopy done on 06/06/2022 showed diverticulosis of the sigmoid colon showed no source of bleeding. Discussed with IR they will not embolized without a direct source confirmed by CTA. GI recommended the patient not to be on anticoagulation. Continue CBCs every 12 hours transfuse for hemoglobin less than 8.  Hemorrhagic shock: Continue to hold antihypertensive medication, MAP greater than 65.  Acute  kidney injury on chronic kidney stage IV: With a baseline creatinine around 1.4, likely prerenal azotemia in the setting of hypovolemia due to GI bleed. Resolved with IV fluid resuscitation and blood transfusions.  Type 2 diabetes mellitus with other specified complication (HCC) On full liquid diet, continue sliding scale.  Chronic pain/fibromyalgia: Continue Cymbalta paroxetine temazepam and melatonin.   History of pulmonary embolism History of pulmonary embolism, she is not a candidate for anticoagulation. I discussed the risk and benefits of being on anticoagulation with the family and they would like not to be on anticoagulation she will bleed which she is at very high risk of. They did opt for an IVC filter they understand the risk and benefits of proceeding with an IVC filter and we both agreed that the benefit of having an IVC filter await the risk versus anticoagulation. Consult IR for IVC filter.  Goals of care, counseling/discussion Poor prognosis.   DVT prophylaxis: none Family Communication:none Status is: Inpatient Remains inpatient appropriate because: Hemorrhagic shock    Code Status:     Code Status Orders  (From admission, onward)           Start     Ordered   06/02/22 1325  Full code  Continuous        06/02/22 1326           Code Status History     Date Active Date Inactive Code Status Order ID Comments User Context   02/18/2022 2344 02/23/2022 2231 Full Code [id number]  Kayleen Memos, DO ED   01/09/2022 1834 01/17/2022 0136 DNR [id number]  Jonnie Finner, DO Inpatient   12/04/2021 1851 01/07/2022 1811 DNR [id number]  Cathlyn Parsons, PA-C Inpatient   11/22/2021 1437  12/04/2021 1847 DNR [id number]  Knox Royalty, NP Inpatient   10/28/2021 1411 11/22/2021 1437 Full Code [id number]  Donita Brooks, NP ED   10/28/2021 1408 10/28/2021 1411 Full Code [id number]  Donita Brooks, NP ED         IV Access:   Peripheral IV   Procedures and diagnostic  studies:   VAS [name] LOWER EXTREMITY VENOUS (DVT)  Result Date: 06/07/2022  Lower Venous DVT Study Patient Name:  [name]  Date of Exam:   06/07/2022 Medical Rec #: [id number]        Accession #:    [id number] Date of Birth: 1951-04-13        Patient Gender: F Patient Age:   37 years Exam Location:  [facility] Procedure:      VAS [name] LOWER EXTREMITY VENOUS (DVT) Referring Phys: Bess Harvest ORTIZ --------------------------------------------------------------------------------  Indications: Hx of DVT, and Swelling.  Comparison Study: Previous exam dated 05/01/22, negative for acute DVT                   bilateraly, positive for chronic DVT on the left in the common                   and proximal femoral vein segments. Performing Technologist: Bobetta Lime BS, RVT  Examination Guidelines: A complete evaluation includes B-mode imaging, spectral Doppler, color Doppler, and power Doppler as needed of all accessible portions of each vessel. Bilateral testing is considered an integral part of a complete examination. Limited examinations for reoccurring indications may be performed as noted. The reflux portion of the exam is performed with the patient in reverse Trendelenburg.  +---------+---------------+---------+-----------+----------+--------------+ RIGHT    CompressibilityPhasicitySpontaneityPropertiesThrombus Aging +---------+---------------+---------+-----------+----------+--------------+ CFV      Full           Yes      Yes                                 +---------+---------------+---------+-----------+----------+--------------+ SFJ      Full                                                        +---------+---------------+---------+-----------+----------+--------------+ FV Prox  Full                                                        +---------+---------------+---------+-----------+----------+--------------+ FV Mid   Full                                                         +---------+---------------+---------+-----------+----------+--------------+ FV DistalFull                                                        +---------+---------------+---------+-----------+----------+--------------+ PFV  Full                                                        +---------+---------------+---------+-----------+----------+--------------+ POP      Full           Yes      Yes                                 +---------+---------------+---------+-----------+----------+--------------+ PTV      Full                                                        +---------+---------------+---------+-----------+----------+--------------+ PERO     Full                                                        +---------+---------------+---------+-----------+----------+--------------+   +---------+---------------+---------+-----------+----------+--------------+ LEFT     CompressibilityPhasicitySpontaneityPropertiesThrombus Aging +---------+---------------+---------+-----------+----------+--------------+ CFV      Partial        Yes      Yes                  Chronic        +---------+---------------+---------+-----------+----------+--------------+ SFJ      Full                                                        +---------+---------------+---------+-----------+----------+--------------+ FV Prox  Partial                                      Chronic        +---------+---------------+---------+-----------+----------+--------------+ FV Mid   Full                                                        +---------+---------------+---------+-----------+----------+--------------+ FV DistalFull                                                        +---------+---------------+---------+-----------+----------+--------------+ PFV      Full                                                         +---------+---------------+---------+-----------+----------+--------------+ POP      Full                                                        +---------+---------------+---------+-----------+----------+--------------+  PTV      Full                                                        +---------+---------------+---------+-----------+----------+--------------+ PERO     Full                                                        +---------+---------------+---------+-----------+----------+--------------+     Summary: BILATERAL: -No evidence of popliteal cyst, bilaterally. RIGHT: - No evidence of deep vein thrombosis in the lower extremity. No indirect evidence of obstruction proximal to the inguinal ligament.  LEFT: - Findings consistent with chronic deep vein thrombosis involving the left common femoral vein, and left femoral vein. - Findings appear essentially unchanged compared to previous examination.  *See table(s) above for measurements and observations.    Preliminary      Medical Consultants:   None.   Subjective:    [name] denies pain no more bloody bowel movements  Objective:    Vitals:   06/07/22 1632 06/07/22 2023 06/07/22 2301 06/08/22 0314  BP: 124/77 125/69 128/73 125/71  Pulse: 86 75 84 68  Resp: '16 19 16 14  '$ Temp: 98.1 F (36.7 C) 97.6 F (36.4 C) 98.8 F (37.1 C) 97.9 F (36.6 C)  TempSrc: Oral Oral Oral Oral  SpO2: 98% 100% 100% 100%   SpO2: 100 % O2 Flow Rate (L/min): 4 L/min   Intake/Output Summary (Last 24 hours) at 06/08/2022 2376 Last data filed at 06/08/2022 2831 Gross per 24 hour  Intake --  Output 800 ml  Net -800 ml    There were no vitals filed for this visit.  Exam: General exam: In no acute distress. Respiratory system: Good air movement and clear to auscultation. Cardiovascular system: S1 & S2 heard, RRR. No JVD. Gastrointestinal system: Abdomen is nondistended, soft and nontender.  Extremities: No pedal  edema. Skin: No rashes, lesions or ulcers Psychiatry: Judgement and insight appear normal. Mood & affect appropriate.  Data Reviewed:    Labs: Basic Metabolic Panel: Recent Labs  Lab 06/04/22 0113 06/05/22 0953 06/06/22 0740 06/07/22 0301 06/08/22 0218  NA 138 140 141 140 142  K 3.9 3.7 3.7 3.7 3.4*  CL 114* 113* 113* 111 111  CO2 21* 23 21* 25 27  GLUCOSE 188* 191* 114* 154* 164*  BUN 30* '20 13 12 12  '$ CREATININE 1.83* 1.60* 1.54* 1.63* 1.76*  CALCIUM 7.5* 7.9* 8.4* 7.8* 7.7*    GFR CrCl cannot be calculated (Unknown ideal weight.). Liver Function Tests: Recent Labs  Lab 06/02/22 1100 06/04/22 0113  AST 20 13*  ALT 15 10  ALKPHOS 58 28*  BILITOT 0.4 1.5*  PROT 5.6* 3.2*  ALBUMIN 2.7* 1.8*    No results for input(s): "LIPASE", "AMYLASE" in the last 168 hours. No results for input(s): "AMMONIA" in the last 168 hours. Coagulation profile Recent Labs  Lab 06/02/22 1100 06/03/22 0341 06/03/22 1128  INR 1.3* 1.3* 1.2    COVID-19 Labs  No results for input(s): "DDIMER", "FERRITIN", "LDH", "CRP" in the last 72 hours.  Lab Results  Component Value Date   [place] NEGATIVE 10/28/2021   SARSCOV2NAA  NEGATIVE 10/23/2020   [place] NEGATIVE 02/12/2020    CBC: Recent Labs  Lab 06/02/22 1100 06/02/22 1812 06/03/22 1128 06/03/22 1648 06/04/22 1650 06/05/22 0114 06/05/22 0953 06/05/22 1632 06/06/22 1106 06/06/22 1643  WBC 7.3   < > 10.8*   < > 7.9  --  9.8 8.8 7.5 10.6*  NEUTROABS 3.6  --  6.2  --   --   --   --   --   --   --   HGB 8.9*   < > 8.6*   < > 8.2* 6.9* 10.2* 10.3* 10.3* 11.0*  HCT 28.2*   < > 25.0*   < > 23.2* 19.8* 29.3* 28.9* 28.8* 32.4*  MCV 86.8   < > 84.5   < > 83.5  --  86.2 85.0 85.5 88.8  PLT 296   < > 154   < > 103*  --  98* 112* 131* 160   < > = values in this interval not displayed.    Cardiac Enzymes: No results for input(s): "CKTOTAL", "CKMB", "CKMBINDEX", "TROPONINI" in the last 168 hours. BNP (last 3 results) No  results for input(s): "PROBNP" in the last 8760 hours. CBG: Recent Labs  Lab 06/07/22 0535 06/07/22 1125 06/07/22 1627 06/07/22 2128 06/08/22 0626  GLUCAP 130* 127* 229* 231* 149*    D-Dimer: No results for input(s): "DDIMER" in the last 72 hours. Hgb A1c: No results for input(s): "HGBA1C" in the last 72 hours. Lipid Profile: No results for input(s): "CHOL", "HDL", "LDLCALC", "TRIG", "CHOLHDL", "LDLDIRECT" in the last 72 hours. Thyroid function studies: No results for input(s): "TSH", "T4TOTAL", "T3FREE", "THYROIDAB" in the last 72 hours.  Invalid input(s): "FREET3" Anemia work up: No results for input(s): "VITAMINB12", "FOLATE", "FERRITIN", "TIBC", "IRON", "RETICCTPCT" in the last 72 hours. Sepsis Labs: Recent Labs  Lab 06/05/22 0953 06/05/22 1632 06/06/22 1106 06/06/22 1643  WBC 9.8 8.8 7.5 10.6*    Microbiology No results found for this or any previous visit (from the past 240 hour(s)).   Medications:    atorvastatin  20 mg Oral Daily   bisacodyl  10 mg Rectal Once   budesonide  0.25 mg Inhalation BID   Chlorhexidine Gluconate Cloth  6 each Topical Daily   feeding supplement (GLUCERNA SHAKE)  237 mL Oral TID BM   insulin aspart  0-5 Units Subcutaneous QHS   insulin aspart  0-9 Units Subcutaneous TID WC   lidocaine  1 patch Transdermal Q24H   melatonin  5 mg Oral QPM   multivitamin with minerals  1 tablet Oral Daily   pantoprazole  40 mg Oral BID   sodium chloride flush  3 mL Intravenous Q12H   Continuous Infusions:  lactated ringers 100 mL/hr at 06/06/22 1300      LOS: 6 days   Charlynne Cousins  [facility]  06/08/2022, 7:33 AM

## 2022-06-09 DIAGNOSIS — Z7189 Other specified counseling: Secondary | ICD-10-CM

## 2022-06-09 DIAGNOSIS — I10 Essential (primary) hypertension: Secondary | ICD-10-CM | POA: Diagnosis not present

## 2022-06-09 DIAGNOSIS — E440 Moderate protein-calorie malnutrition: Secondary | ICD-10-CM

## 2022-06-09 DIAGNOSIS — K922 Gastrointestinal hemorrhage, unspecified: Secondary | ICD-10-CM | POA: Diagnosis not present

## 2022-06-09 DIAGNOSIS — N184 Chronic kidney disease, stage 4 (severe): Secondary | ICD-10-CM | POA: Diagnosis not present

## 2022-06-09 LAB — GLUCOSE, CAPILLARY
Glucose-Capillary: 149 mg/dL — ABNORMAL HIGH (ref 70–99)
Glucose-Capillary: 203 mg/dL — ABNORMAL HIGH (ref 70–99)

## 2022-06-09 LAB — BASIC METABOLIC PANEL
Anion gap: 7 (ref 5–15)
BUN: 13 mg/dL (ref 8–23)
CO2: 28 mmol/L (ref 22–32)
Calcium: 7.9 mg/dL — ABNORMAL LOW (ref 8.9–10.3)
Chloride: 107 mmol/L (ref 98–111)
Creatinine, Ser: 1.69 mg/dL — ABNORMAL HIGH (ref 0.44–1.00)
GFR, Estimated: 32 mL/min — ABNORMAL LOW (ref >60)
Glucose, Bld: 202 mg/dL — ABNORMAL HIGH (ref 70–99)
Potassium: 3.5 mmol/L (ref 3.5–5.1)
Sodium: 142 mmol/L (ref 135–145)

## 2022-06-09 MED ORDER — LACTATED RINGERS IV SOLN: INTRAVENOUS | Status: AC

## 2022-06-09 NOTE — TOC Transition Note (Signed)
Transition of Care (TOC) - CM/SW Discharge Note Maria Gibbons RN, BSN Transitions of Care Unit 4E- RN Case Manager See Treatment Team for direct phone #    Patient Details  Name: [name] MRN: [id number] Date of Birth: 10-30-1950  Transition of Care [facility]) CM/SW Contact:  Maria Patricia, RN Phone Number: 06/09/2022, 10:42 AM   Clinical Narrative:    Pt stable for transition home today w/ daughter.  Orders placed for HHPT/OT and DME- RW.  CM spoke with pt at bedside- pt agreeable to [facility] services. List provided for [facility] choice Per CMS guidelines from medicare.gov website with star ratings (copy placed in shadow chart). Pt voiced she has used [facility] in past and would like to use them again.   Pt also asked about a shower chair and new RW (stating her walker was "wobbly") Explained to pt that Medicare would not cover shower chair, and that Medicare only covers RW once every 5 yrs.  Will have [employer] check to see when pt last received walker under insurance.  Pt voiced understanding.   Pt voiced her daughter to come provide transport home.   Call made to [facility] for [facility] referral- referral has been accepted.   Call made to [employer] to check on RW- per [facility] w/ [employer] pt received w/c, BSC and rolling walker in March of this year 2023. She is not eligible for a new RW at this time. If she wants to bring it to an [place] store they can check to see if any screws are loose. Or pt/family can purchase a new one out of pocket (cost $40).   Call made to daughter [name]- to discuss DME- Per conversation [name] confirmed DME was received in March- Explained insurance would not cover a new RW or shower chair. [name] declined needing any further assistance from [employer], stating patient had what she needed at this time.  [place] services choice per pt also confirmed with [name] and she agreed w/ using [place].  [name] asked about discharge and was informed pt had discharge order and could be  transported home at anytime.   No further TOC needs- pt ready for transition home w/ [facility] when daughter arrives.   Final next level of care: [place] Barriers to Discharge: Barriers Resolved   Patient Goals and CMS Choice Patient states their goals for this hospitalization and ongoing recovery are:: To return home CMS Medicare.gov Compare Post Acute Care list provided to:: Patient Choice offered to / list presented to : Patient, Adult Children  Discharge Placement                 Home w/ [facility]      Discharge Plan and Services   Discharge Planning Services: CM Consult Post Acute Care Choice: Home Health, Durable Medical Equipment          DME Arranged: Walker rolling DME Agency: [employer] Date DME Agency Contacted: 06/09/22 Time DME Agency Contacted: 1275 Representative spoke with at DME Agency: Delana Meyer (Per Delana Meyer- Pt recieved RW in March 2023- insurance will not cover another at this time) HH Arranged: PT, OT [place] Agency: [place] Date [place]: 06/09/22 Time [place]: 35 Representative spoke with at [place]: [place] ([place]) Interventions     Readmission Risk Interventions    06/09/2022   10:42 AM 01/10/2022    9:45 AM 12/04/2021    4:26 PM  Readmission Risk Prevention  Plan  Transportation Screening Complete Complete Complete  PCP or Specialist Appt within 3-5 Days Complete Complete Not Complete  Not Complete comments   INPT rehab  [place] or [place] Complete Complete Complete  Social Work Consult for [place] Planning/Counseling Complete Complete Complete  Palliative Care Screening Not Applicable Complete Complete  Medication Review Press photographer) Complete Complete Complete

## 2022-06-09 NOTE — Progress Notes (Signed)
Order received to discharge patient.  Telemetry monitor removed and CCMD notified.  PIV access removed.  Discharge instructions, follow up, medications and instructions for their use discussed with patient. 

## 2022-06-09 NOTE — Discharge Summary (Signed)
Physician Discharge Summary  [name] QQV:[id number] DOB: 1951-04-26 DOA: 06/02/2022  PCP: [name], MD  Admit date: 06/02/2022 Discharge date: 06/09/2022  Admitted From: Home Disposition:  Home  Recommendations for Outpatient Follow-up:  Follow up with PCP in 1-2 weeks Please obtain BMP/CBC in one week   Home Health:No Equipment/Devices:None  Discharge Condition:Stable CODE STATUS:Full Diet recommendation: Heart Healthy   Brief/Interim Summary: 71 y.o. female past medical history significant for cardiac arrest in January 2023, stage IV chronic kidney disease, diabetes mellitus type 2, hyperlipidemia, recently discharged from the hospital on 12/04/2021 for V-fib arrest due to PE underwent mechanical thrombectomy due to massive PE, hospitalization was complicated by intrahepatic/intraperitoneal hemorrhage with DIC, discharged to CIR and remained there until 313 12/17/2021, rehospitalized again on 01/09/2022 to 01/16/2022 for refractory hypoglycemia, discharged on 02/22/2028 for lower GI bleed from internal hemorrhoids who comes in for lower GI bleed while on Eliquis related she was off balance weak and dizzy had about 4 episodes of gushing blood in the bathroom.  On arrival to the ED her hemoglobin was hemoglobin went down to 4.5 was given 5 units of packed red blood cells blood pressure improved CT angio of the abdomen and pelvis showed revealed no source of bleeding. PCCM was consulted for ongoing GI bleed and hemodynamic instability. Nuclear scan showed increased radioactive tracer localized in the right lower quadrant    Discharge Diagnoses:  Principal Problem:   Acute lower GI bleeding Active Problems:   Type 2 diabetes mellitus with other specified complication (HCC)   CKD (chronic kidney disease) stage 4, GFR 15-29 ml/min (HCC)   Pure hypercholesterolemia   Essential hypertension   History of pulmonary embolism   History of anemia due to chronic kidney disease   Goals of  care, counseling/discussion   Malnutrition of moderate degree  Acute lower GI bleed probably due to diverticular bleed/acute blood loss anemia/hemorrhagic shock: EGD and colonoscopy in April 2023 showed diverticulosis with hemorrhoids and mild gastritis. GI was consulted on this admission recommended colonoscopy done on 06/06/2022 showed diverticulosis no signs of bleeding. Discussed with IR and there is no diuretic source of bleeding confirmed by CTA so embolization was not recommended. IR was consulted for IVC filter see below for further details.  Hemorrhagic shock: Antihypertensive medications were held her MAP was kept greater than 65 she was transfused 2 units of packed red blood cells and fluid resuscitated.  Acute kidney injury on chronic kidney disease stage IV: With a baseline creatinine of 1.4 likely prerenal azotemia in the setting of hemorrhagic shock, resolved with IV fluid resuscitation and blood transfusions.  Diabetes mellitus type 2: No changes made to her medication.  Chronic pain/fibromyalgia: Continue Cymbalta paroxetine temazepam and melatonin.  History of PE: She had a massive PE earlier this year she is not a candidate for anticoagulation due to her GI bleed, which GI and pulmonary agreed that she is high risk for anticoagulation. Risk and benefits were discussed with the patient and the daughter about she being on anticoagulation and they agreed she should not be on anticoagulation as she is at risk of bleeding. After discussing the options they did opt for an IVC filter and risk and benefits were explained. IR was consulted and filter was placed on 06/08/2022 she was monitored overnight and remained stable.  Goals of care: Poor prognosis Palliative care to continue follow-up as an outpatient.  Discharge Instructions  Discharge Instructions     Diet - low sodium heart healthy   Complete  by: As directed    Increase activity slowly   Complete by: As  directed    No wound care   Complete by: As directed       Allergies as of 06/09/2022       Reactions   Darvon [propoxyphene] Itching   Hydrocodone Bit-homatrop Mbr Itching   Metformin Nausea And Vomiting   Pentazocine Other (See Comments)     jitters   Percocet [oxycodone-acetaminophen] Itching   Sulfa Antibiotics Hives   Tetracaine Hcl Itching   Tetracyclines & Related Hives   Tramadol Itching   Benadryl [diphenhydramine] Itching, Other (See Comments)   Can only take dye free. States the dye causes itching.   Other    Other reaction(s): Unknown. States reaction was to nasal spray that caused pupils to shrink   Aspirin Nausea And Vomiting        Medication List     STOP taking these medications    apixaban 5 MG Tabs tablet Commonly known as: ELIQUIS       TAKE these medications    acetaminophen 325 MG tablet Commonly known as: TYLENOL Take 2 tablets (650 mg total) by mouth every 4 (four) hours as needed for fever (greater than 37.6 C). What changed:  when to take this reasons to take this   albuterol 108 (90 Base) MCG/ACT inhaler Commonly known as: VENTOLIN HFA Inhale 1-2 puffs into the lungs every 4 (four) hours as needed for wheezing or shortness of breath. What changed:  how much to take when to take this   amLODipine 10 MG tablet Commonly known as: NORVASC Take 10 mg by mouth daily.   atorvastatin 20 MG tablet Commonly known as: LIPITOR Take 1 tablet (20 mg total) by mouth daily.   B-complex with vitamin C tablet Take 1 tablet by mouth daily.   dicyclomine 10 MG capsule Commonly known as: BENTYL Take 1 capsule (10 mg total) by mouth 3 (three) times daily before meals. What changed:  when to take this reasons to take this   DULoxetine 20 MG capsule Commonly known as: CYMBALTA Take 1 capsule (20 mg total) by mouth daily.   Flovent HFA 220 MCG/ACT inhaler Generic drug: fluticasone Inhale 2 puffs into the lungs as needed  (wheezing/SOB).   folic acid 1 MG tablet Commonly known as: FOLVITE Take 2 tablets (2 mg total) by mouth daily.   Glucerna Liqd Take 237 mLs by mouth 2 (two) times daily between meals.   IRON PO Take 1 tablet by mouth daily.   lidocaine 5 % Commonly known as: LIDODERM Place 2 patches onto the skin daily. Remove & Discard patch within 12 hours or as directed by MD What changed:  how much to take when to take this reasons to take this   melatonin 5 MG Tabs Take 1 tablet (5 mg total) by mouth every evening.   ondansetron 4 MG tablet Commonly known as: ZOFRAN Take 4 mg by mouth as needed for nausea or vomiting.   oxyCODONE 5 MG immediate release tablet Commonly known as: Oxy IR/ROXICODONE Take 5 mg by mouth daily as needed for severe pain.   pantoprazole 40 MG tablet Commonly known as: PROTONIX Take 1 tablet (40 mg total) by mouth 2 (two) times daily.   PARoxetine 40 MG tablet Commonly known as: PAXIL Take by mouth daily.   simvastatin 40 MG tablet Commonly known as: ZOCOR Take 40 mg by mouth daily.   temazepam 7.5 MG capsule Commonly known as: RESTORIL Take  1 capsule (7.5 mg total) by mouth at bedtime as needed for sleep.   Vitamin D (Ergocalciferol) 1.25 MG (50000 UNIT) Caps capsule Commonly known as: DRISDOL Take 1 capsule (50,000 Units total) by mouth once a week.        Allergies  Allergen Reactions   Darvon [Propoxyphene] Itching   Hydrocodone Bit-Homatrop Mbr Itching   Metformin Nausea And Vomiting   Pentazocine Other (See Comments)      jitters   Percocet [Oxycodone-Acetaminophen] Itching   Sulfa Antibiotics Hives   Tetracaine Hcl Itching   Tetracyclines & Related Hives   Tramadol Itching   Benadryl [Diphenhydramine] Itching and Other (See Comments)    Can only take dye free. States the dye causes itching.   Other     Other reaction(s): Unknown. States reaction was to nasal spray that caused pupils to shrink   Aspirin Nausea And Vomiting     Consultations: Pulmonary critical care GI Interventional radiology   Procedures/Studies: IR IVC FILTER PLMT / S&I /IMG GUID/MOD SED  Result Date: 06/08/2022 CLINICAL DATA:  Left lower extremity DVT and history of prior massive pulmonary embolism. Hemorrhagic complications of anticoagulation including intraperitoneal bleed from the liver and more recently persistent gastrointestinal bleed during current hospital admission. IVC filter placement requested as there is a contraindication to further anticoagulation. EXAM: 1. ULTRASOUND GUIDANCE FOR VASCULAR ACCESS OF THE RIGHT INTERNAL JUGULAR VEIN. 2. IVC VENOGRAM. 3. PERCUTANEOUS IVC FILTER PLACEMENT. ANESTHESIA/SEDATION: Moderate (conscious) sedation was employed during this procedure. A total of Versed 1.5 mg and Fentanyl 75 mcg was administered intravenously by radiology nursing. Moderate Sedation Time: 15 minutes. The patient's level of consciousness and vital signs were monitored continuously by radiology nursing throughout the procedure under my direct supervision. CONTRAST:  CO2 gas FLUOROSCOPY TIME:  1 minute and 54 seconds.  9.1 mGy. PROCEDURE: The procedure, risks, benefits, and alternatives were explained to the patient. Questions regarding the procedure were encouraged and answered. The patient understands and consents to the procedure. A time-out was performed prior to initiating the procedure. The right neck was prepped with chlorhexidine in a sterile fashion, and a sterile drape was applied covering the operative field. A sterile gown and sterile gloves were used for the procedure. Local anesthesia was provided with 1% Lidocaine. Ultrasound was utilized to confirm patency of the right internal jugular vein. A permanent ultrasound image was recorded and saved. Under direct ultrasound guidance, a 21 gauge needle was advanced into the right internal jugular vein with ultrasound image documentation performed. After securing access with a  micropuncture dilator, a guidewire was advanced into the inferior vena cava. A deployment sheath was advanced over the guidewire. This was utilized to perform IVC venography. The deployment sheath was further positioned in an appropriate location for filter deployment. A Bard Denali IVC filter was then advanced in the sheath. This was then fully deployed in the infrarenal IVC. Final filter position was confirmed with a fluoroscopic spot image. After the procedure the sheath was removed and hemostasis obtained with manual compression. COMPLICATIONS: None. FINDINGS: IVC venography demonstrates a normal caliber IVC with no evidence of thrombus. CO2 was utilized due to acute kidney injury and renal insufficiency. Renal veins are identified bilaterally. The IVC filter was successfully positioned below the level of the renal veins and is appropriately oriented. This IVC filter has both permanent and retrievable indications. IMPRESSION: Placement of percutaneous IVC filter in infrarenal IVC. IVC venogram shows no evidence of IVC thrombus and normal caliber of the inferior vena cava.  This filter does have both permanent and retrievable indications. PLAN: Due to patient related comorbidities and/or clinical necessity, this IVC filter should be considered a permanent device. This patient will not be actively followed for future filter retrieval. Electronically Signed   By: Aletta Edouard M.D.   On: 06/08/2022 12:48   VAS [name] LOWER EXTREMITY VENOUS (DVT)  Result Date: 06/08/2022  Lower Venous DVT Study Patient Name:  LENOIR FACCHINI  Date of Exam:   06/07/2022 Medical Rec #: [id number]        Accession #:    [id number] Date of Birth: 03/30/1951        Patient Gender: F Patient Age:   54 years Exam Location:  [facility] Procedure:      VAS [name] LOWER EXTREMITY VENOUS (DVT) Referring Phys: Bess Harvest ORTIZ --------------------------------------------------------------------------------  Indications: Hx of DVT, and  Swelling.  Comparison Study: Previous exam dated 05/01/22, negative for acute DVT                   bilateraly, positive for chronic DVT on the left in the common                   and proximal femoral vein segments. Performing Technologist: Bobetta Lime BS, RVT  Examination Guidelines: A complete evaluation includes B-mode imaging, spectral Doppler, color Doppler, and power Doppler as needed of all accessible portions of each vessel. Bilateral testing is considered an integral part of a complete examination. Limited examinations for reoccurring indications may be performed as noted. The reflux portion of the exam is performed with the patient in reverse Trendelenburg.  +---------+---------------+---------+-----------+----------+--------------+ RIGHT    CompressibilityPhasicitySpontaneityPropertiesThrombus Aging +---------+---------------+---------+-----------+----------+--------------+ CFV      Full           Yes      Yes                                 +---------+---------------+---------+-----------+----------+--------------+ SFJ      Full                                                        +---------+---------------+---------+-----------+----------+--------------+ FV Prox  Full                                                        +---------+---------------+---------+-----------+----------+--------------+ FV Mid   Full                                                        +---------+---------------+---------+-----------+----------+--------------+ FV DistalFull                                                        +---------+---------------+---------+-----------+----------+--------------+ PFV      Full                                                        +---------+---------------+---------+-----------+----------+--------------+  POP      Full           Yes      Yes                                  +---------+---------------+---------+-----------+----------+--------------+ PTV      Full                                                        +---------+---------------+---------+-----------+----------+--------------+ PERO     Full                                                        +---------+---------------+---------+-----------+----------+--------------+   +---------+---------------+---------+-----------+----------+--------------+ LEFT     CompressibilityPhasicitySpontaneityPropertiesThrombus Aging +---------+---------------+---------+-----------+----------+--------------+ CFV      Partial        Yes      Yes                  Chronic        +---------+---------------+---------+-----------+----------+--------------+ SFJ      Full                                                        +---------+---------------+---------+-----------+----------+--------------+ FV Prox  Partial                                      Chronic        +---------+---------------+---------+-----------+----------+--------------+ FV Mid   Full                                                        +---------+---------------+---------+-----------+----------+--------------+ FV DistalFull                                                        +---------+---------------+---------+-----------+----------+--------------+ PFV      Full                                                        +---------+---------------+---------+-----------+----------+--------------+ POP      Full                                                        +---------+---------------+---------+-----------+----------+--------------+ PTV  Full                                                        +---------+---------------+---------+-----------+----------+--------------+ PERO     Full                                                         +---------+---------------+---------+-----------+----------+--------------+     Summary: BILATERAL: -No evidence of popliteal cyst, bilaterally. RIGHT: - No evidence of deep vein thrombosis in the lower extremity. No indirect evidence of obstruction proximal to the inguinal ligament.  LEFT: - Findings consistent with chronic deep vein thrombosis involving the left common femoral vein, and left femoral vein. - Findings appear essentially unchanged compared to previous examination.  *See table(s) above for measurements and observations. Electronically signed by Orlie Pollen on 06/08/2022 at 12:36:11 PM.    Final    CT Angio Abd/Pel w/ and/or w/o  Result Date: 06/05/2022 CLINICAL DATA:  71 year old female with concern for lower gastrointestinal hemorrhage. EXAM: CT ANGIOGRAPHY ABDOMEN AND PELVIS WITH CONTRAST AND WITHOUT CONTRAST TECHNIQUE: Multidetector CT imaging of the abdomen and pelvis was performed using the standard protocol during bolus administration of intravenous contrast. Multiplanar reconstructed images and MIPs were obtained and reviewed to evaluate the vascular anatomy. RADIATION DOSE REDUCTION: This exam was performed according to the departmental dose-optimization program which includes automated exposure control, adjustment of the mA and/or kV according to patient size and/or use of iterative reconstruction technique. CONTRAST:  66m OMNIPAQUE IOHEXOL 350 MG/ML SOLN COMPARISON:  06/02/2022, 06/03/2022 FINDINGS: VASCULAR Aorta: Atherosclerosis without signficant stenosis, dissection, or aneurysm. Celiac: Patent without evidence of aneurysm, dissection, vasculitis or significant stenosis. SMA: Patent without evidence of aneurysm, dissection, vasculitis or significant stenosis. Renals: Single bilateral renal arteries which are patent. Mild corrugated appearance of the mid bilateral, right greater than left renal arteries. IMA: High-grade ostial stenosis secondary to atherosclerotic plaque with mild  poststenotic ectasia. Patent distally. Inflow: Patent without evidence of aneurysm, dissection, vasculitis or significant stenosis. Proximal Outflow: Bilateral common femoral and visualized portions of the superficial and profunda femoral arteries are patent without evidence of aneurysm, dissection, vasculitis or significant stenosis. Veins: The hepatic veins are widely patent. The portal system is widely patent and normal in caliber. The renal veins are patent bilaterally in standard anatomic configuration. No evidence of iliocaval thrombosis or anomaly. Review of the MIP images confirms the above findings. NON-VASCULAR Lower chest: Similar appearing left greater than right bibasilar subsegmental atelectasis. The heart is normal in size. No pericardial effusion. Hepatobiliary: Similar appearing left lobe, partially exophytic simple appearing cyst measuring up to approximately 5.2 cm. The remaining liver is within normal limits of size and morphology. The gallbladder is present with vicarious excretion of contrast material. No intra or extrahepatic biliary ductal dilation. Pancreas: Unremarkable. No pancreatic ductal dilatation or surrounding inflammatory changes. Spleen: Normal in size without focal abnormality. Adrenals/Urinary Tract: Adrenal glands are unremarkable. Kidneys are normal, without renal calculi, focal lesion, or hydronephrosis. Bladder is unremarkable. Stomach/Bowel: Stomach is within normal limits. Appendix appears normal. High density material is visualized throughout the colon. Pan colonic diverticula without surrounding inflammatory changes. No evidence of acute intraluminal hemorrhage.  No evidence of bowel wall thickening, distention, or inflammatory changes. Lymphatic: No abdominopelvic lymphadenopathy. Reproductive: Uterus and bilateral adnexa are unremarkable. Other: Unchanged small umbilical hernia containing omental fat. No abdominopelvic ascites. Musculoskeletal: No acute or significant  osseous findings. IMPRESSION: VASCULAR 1. No evidence of acute intraluminal gastrointestinal hemorrhage. There is high density material throughout the colon, absent in the distal small bowel. This likely represents iodinated contrast from slow/prior hemorrhage into the bowel in the region of the cecum, compatible with findings on prior tagged red blood cell nuclear medicine study. Colonoscopic evaluation could be considered. 2. 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 3. Right greater than left bilateral renal artery fibromuscular dysplasia. 4.  Aortoiliac atherosclerosis (ICD10-I70.0). NON-VASCULAR 1. Pan colonic diverticulosis without evidence of diverticulitis. 2. Similar appearing bibasilar subsegmental atelectasis, left greater than right. Ruthann Cancer, MD [facility] Radiology Electronically Signed   By: Ruthann Cancer M.D.   On: 06/05/2022 11:11   CT ANGIO GI BLEED  Result Date: 06/03/2022 CLINICAL DATA:  GI bleed EXAM: CTA ABDOMEN AND PELVIS WITHOUT AND WITH CONTRAST TECHNIQUE: Multidetector CT imaging of the abdomen and pelvis was performed using the standard protocol during bolus administration of intravenous contrast. Multiplanar reconstructed images and MIPs were obtained and reviewed to evaluate the vascular anatomy. RADIATION DOSE REDUCTION: This exam was performed according to the departmental dose-optimization program which includes automated exposure control, adjustment of the mA and/or kV according to patient size and/or use of iterative reconstruction technique. CONTRAST:  59m OMNIPAQUE IOHEXOL 350 MG/ML SOLN COMPARISON:  Nuclear medicine GI bleed study 06/02/2022 FINDINGS: VASCULAR Aorta: There is no acute aortic abnormality. There is moderate infrarenal atherosclerosis. Celiac: Patent without evidence of aneurysm, dissection, vasculitis or significant stenosis. SMA:  Patent without evidence of aneurysm, dissection, vasculitis or significant stenosis. Renals: Both renal arteries are patent without evidence of aneurysm, dissection, vasculitis, fibromuscular dysplasia or significant stenosis. IMA: Patent without evidence of aneurysm, dissection, vasculitis or significant stenosis. Inflow: Patent without evidence of aneurysm, dissection, vasculitis or significant stenosis. Proximal Outflow: Bilateral common femoral and visualized portions of the superficial and profunda femoral arteries are patent without evidence of aneurysm, dissection, vasculitis or significant stenosis. Veins: No obvious venous abnormality within the limitations of this arterial phase study. Review of the MIP images confirms the above findings. NON-VASCULAR Lower chest: Left basilar atelectasis Hepatobiliary: 4.7 cm cystic lesion of the left hepatic lobe. No biliary dilatation. Gallbladder is normal. Pancreas: Normal Spleen: Normal Adrenals/Urinary Tract: Normal adrenal glands and kidneys. Stomach/Bowel: Stomach is within normal limits. Appendix appears normal. No evidence of bowel wall thickening, distention, or inflammatory changes. Lymphatic: No significant vascular findings are present. No enlarged abdominal or pelvic lymph nodes. Reproductive: Uterus and bilateral adnexa are unremarkable. Other: No abdominal wall hernia or abnormality. No abdominopelvic ascites. Musculoskeletal: No acute or significant osseous findings. IMPRESSION: 1. No arterial extravasation or GI bleed source is identified. 2. Aortic Atherosclerosis (ICD10-I70.0). Electronically Signed   By: KUlyses JarredM.D.   On: 06/03/2022 01:38   NM GI Blood Loss  Result Date: 06/02/2022 CLINICAL DATA:  Gastrointestinal bleeding, history of hemorrhoids EXAM: NUCLEAR MEDICINE GASTROINTESTINAL BLEEDING SCAN TECHNIQUE: Sequential abdominal images were obtained following intravenous administration of Tc-954mabeled red blood cells.  RADIOPHARMACEUTICALS:  22.1 mCi [id number] in-vitro labeled red cells. COMPARISON:  CT 12/15/2021 FINDINGS: Anterior planar imaging of the abdomen and pelvis are performed for 2 hours after intravenous administration of radiotracer. Normal physiologic distribution of  radiotracer within the vascular structures, liver, and spleen. Excreted radiotracer is seen within the bladder. There is a faint area of increased radiotracer activity identified in the right lower quadrant beginning at 1 hour 10 minutes after initial imaging. This becomes more prominent over the course of the second hour of imaging, but there is no propagation along the expected course of the bowel. This is equivocal for active gastrointestinal bleeding. IMPRESSION: 1. Faint area of increased radiotracer activity localizing to the right lower quadrant during the second hour of imaging, without evidence of propagation to suggest peristalsis. Findings are equivocal for active gastrointestinal bleeding in the region of the distal small bowel or cecum. Critical Value/emergent results were called by telephone at the time of interpretation on 06/02/2022 at 6:02 pm to provider [facility] , who verbally acknowledged these results. Electronically Signed   By: Randa Ngo M.D.   On: 06/02/2022 18:02   DG Chest Portable 1 View  Result Date: 06/02/2022 CLINICAL DATA:  Hypotension and weakness. EXAM: PORTABLE CHEST 1 VIEW COMPARISON:  01/09/2022 FINDINGS: The lungs are clear without focal pneumonia, edema, pneumothorax or pleural effusion. The cardiopericardial silhouette is within normal limits for size. Atelectasis or scarring noted at the left base. The visualized bony structures of the thorax are unremarkable. Telemetry leads overlie the chest. IMPRESSION: No active disease. Electronically Signed   By: Misty Stanley M.D.   On: 06/02/2022 12:06   (Echo, Carotid, EGD, Colonoscopy, ERCP)    Subjective: No complaints no evidence of  bleeding.  Discharge Exam: Vitals:   06/09/22 0017 06/09/22 0334  BP: 119/68 (!) 143/69  Pulse: 69 67  Resp: 12 14  Temp: 97.7 F (36.5 C) 97.7 F (36.5 C)  SpO2: 99% 97%   Vitals:   06/08/22 1241 06/08/22 2001 06/09/22 0017 06/09/22 0334  BP: 113/78 117/71 119/68 (!) 143/69  Pulse: 65 73 69 67  Resp: '20 13 12 14  '$ Temp: 97.6 F (36.4 C) 97.7 F (36.5 C) 97.7 F (36.5 C) 97.7 F (36.5 C)  TempSrc: Oral Oral Oral Oral  SpO2: 100% 99% 99% 97%    General: Pt is alert, awake, not in acute distress Cardiovascular: RRR, S1/S2 +, no rubs, no gallops Respiratory: CTA bilaterally, no wheezing, no rhonchi Abdominal: Soft, NT, ND, bowel sounds + Extremities: no edema, no cyanosis    The results of significant diagnostics from this hospitalization (including imaging, microbiology, ancillary and laboratory) are listed below for reference.     Microbiology: No results found for this or any previous visit (from the past 240 hour(s)).   Labs: BNP (last 3 results) Recent Labs    10/29/21 1900 10/30/21 0100 10/30/21 0700  BNP 1,141.5* 877.3* 016.0*   Basic Metabolic Panel: Recent Labs  Lab 06/05/22 0953 06/06/22 0740 06/07/22 0301 06/08/22 0218 06/09/22 0144  NA 140 141 140 142 142  K 3.7 3.7 3.7 3.4* 3.5  CL 113* 113* 111 111 107  CO2 23 21* '25 27 28  '$ GLUCOSE 191* 114* 154* 164* 202*  BUN '20 13 12 12 13  '$ CREATININE 1.60* 1.54* 1.63* 1.76* 1.69*  CALCIUM 7.9* 8.4* 7.8* 7.7* 7.9*   Liver Function Tests: Recent Labs  Lab 06/02/22 1100 06/04/22 0113  AST 20 13*  ALT 15 10  ALKPHOS 58 28*  BILITOT 0.4 1.5*  PROT 5.6* 3.2*  ALBUMIN 2.7* 1.8*   No results for input(s): "LIPASE", "AMYLASE" in the last 168 hours. No results for input(s): "AMMONIA" in the last 168 hours. CBC: Recent  Labs  Lab 06/02/22 1100 06/02/22 1812 06/03/22 1128 06/03/22 1648 06/05/22 0953 06/05/22 1632 06/06/22 1106 06/06/22 1643 06/08/22 1048  WBC 7.3   < > 10.8*   < > 9.8 8.8  7.5 10.6* 8.2  NEUTROABS 3.6  --  6.2  --   --   --   --   --   --   HGB 8.9*   < > 8.6*   < > 10.2* 10.3* 10.3* 11.0* 10.6*  HCT 28.2*   < > 25.0*   < > 29.3* 28.9* 28.8* 32.4* 31.0*  MCV 86.8   < > 84.5   < > 86.2 85.0 85.5 88.8 89.1  PLT 296   < > 154   < > 98* 112* 131* 160 173   < > = values in this interval not displayed.   Cardiac Enzymes: No results for input(s): "CKTOTAL", "CKMB", "CKMBINDEX", "TROPONINI" in the last 168 hours. BNP: Invalid input(s): "POCBNP" CBG: Recent Labs  Lab 06/08/22 0626 06/08/22 1239 06/08/22 1659 06/08/22 2146 06/09/22 0626  GLUCAP 149* 149* 241* 139* 149*   D-Dimer No results for input(s): "DDIMER" in the last 72 hours. Hgb A1c No results for input(s): "HGBA1C" in the last 72 hours. Lipid Profile No results for input(s): "CHOL", "HDL", "LDLCALC", "TRIG", "CHOLHDL", "LDLDIRECT" in the last 72 hours. Thyroid function studies No results for input(s): "TSH", "T4TOTAL", "T3FREE", "THYROIDAB" in the last 72 hours.  Invalid input(s): "FREET3" Anemia work up No results for input(s): "VITAMINB12", "FOLATE", "FERRITIN", "TIBC", "IRON", "RETICCTPCT" in the last 72 hours. Urinalysis    Component Value Date/Time   COLORURINE YELLOW 01/09/2022 1223   APPEARANCEUR CLEAR 01/09/2022 1223   LABSPEC 1.012 01/09/2022 1223   PHURINE 5.0 01/09/2022 1223   GLUCOSEU NEGATIVE 01/09/2022 1223   HGBUR NEGATIVE 01/09/2022 1223   BILIRUBINUR NEGATIVE 01/09/2022 1223   KETONESUR NEGATIVE 01/09/2022 1223   PROTEINUR NEGATIVE 01/09/2022 1223   NITRITE NEGATIVE 01/09/2022 1223   LEUKOCYTESUR NEGATIVE 01/09/2022 1223   Sepsis Labs Recent Labs  Lab 06/05/22 1632 06/06/22 1106 06/06/22 1643 06/08/22 1048  WBC 8.8 7.5 10.6* 8.2   Microbiology No results found for this or any previous visit (from the past 240 hour(s)).    SIGNED:   Charlynne Cousins, MD  [facility] 06/09/2022, 9:48 AM Pager   If 7PM-7AM, please contact  night-coverage [email or web address] Password [email or web address]

## 2022-06-11 DIAGNOSIS — E7800 Pure hypercholesterolemia, unspecified: Secondary | ICD-10-CM | POA: Diagnosis not present

## 2022-06-11 DIAGNOSIS — K922 Gastrointestinal hemorrhage, unspecified: Secondary | ICD-10-CM | POA: Diagnosis not present

## 2022-06-11 DIAGNOSIS — D62 Acute posthemorrhagic anemia: Secondary | ICD-10-CM | POA: Diagnosis not present

## 2022-06-11 DIAGNOSIS — J449 Chronic obstructive pulmonary disease, unspecified: Secondary | ICD-10-CM | POA: Diagnosis not present

## 2022-06-11 DIAGNOSIS — I129 Hypertensive chronic kidney disease with stage 1 through stage 4 chronic kidney disease, or unspecified chronic kidney disease: Secondary | ICD-10-CM | POA: Diagnosis not present

## 2022-06-11 DIAGNOSIS — J9811 Atelectasis: Secondary | ICD-10-CM | POA: Diagnosis not present

## 2022-06-11 DIAGNOSIS — D631 Anemia in chronic kidney disease: Secondary | ICD-10-CM | POA: Diagnosis not present

## 2022-06-11 DIAGNOSIS — N184 Chronic kidney disease, stage 4 (severe): Secondary | ICD-10-CM | POA: Diagnosis not present

## 2022-06-11 DIAGNOSIS — E1122 Type 2 diabetes mellitus with diabetic chronic kidney disease: Secondary | ICD-10-CM | POA: Diagnosis not present

## 2022-06-13 DIAGNOSIS — K922 Gastrointestinal hemorrhage, unspecified: Secondary | ICD-10-CM | POA: Diagnosis not present

## 2022-06-13 DIAGNOSIS — Z86711 Personal history of pulmonary embolism: Secondary | ICD-10-CM | POA: Diagnosis not present

## 2022-06-13 DIAGNOSIS — F419 Anxiety disorder, unspecified: Secondary | ICD-10-CM | POA: Diagnosis not present

## 2022-06-13 DIAGNOSIS — R5381 Other malaise: Secondary | ICD-10-CM | POA: Diagnosis not present

## 2022-07-02 DIAGNOSIS — Z86711 Personal history of pulmonary embolism: Secondary | ICD-10-CM | POA: Diagnosis not present

## 2022-07-02 DIAGNOSIS — N1832 Chronic kidney disease, stage 3b: Secondary | ICD-10-CM | POA: Diagnosis not present

## 2022-07-02 DIAGNOSIS — I129 Hypertensive chronic kidney disease with stage 1 through stage 4 chronic kidney disease, or unspecified chronic kidney disease: Secondary | ICD-10-CM | POA: Diagnosis not present

## 2022-07-02 DIAGNOSIS — E1122 Type 2 diabetes mellitus with diabetic chronic kidney disease: Secondary | ICD-10-CM | POA: Diagnosis not present

## 2022-07-02 DIAGNOSIS — D631 Anemia in chronic kidney disease: Secondary | ICD-10-CM | POA: Diagnosis not present

## 2022-07-02 DIAGNOSIS — N184 Chronic kidney disease, stage 4 (severe): Secondary | ICD-10-CM | POA: Diagnosis not present

## 2022-07-02 DIAGNOSIS — E785 Hyperlipidemia, unspecified: Secondary | ICD-10-CM | POA: Diagnosis not present

## 2022-07-04 ENCOUNTER — Ambulatory Visit: Payer: Medicare PPO | Admitting: Podiatry

## 2022-07-04 ENCOUNTER — Telehealth: Payer: Self-pay

## 2022-07-04 NOTE — Patient Outreach (Signed)
  Care Coordination   07/04/2022 Name: [name] MRN: [id number] DOB: [birth year]   Care Coordination Outreach Attempts:  An unsuccessful telephone outreach was attempted today to offer the patient information about available care coordination services as a benefit of their health plan.   Follow Up Plan:  Additional outreach attempts will be made to offer the patient care coordination information and services.   Encounter Outcome:  No Answer  Care Coordination Interventions Activated:  No   Care Coordination Interventions:  No, not indicated     [place] [facility] [phone]

## 2022-07-05 ENCOUNTER — Inpatient Hospital Stay: Payer: Medicare HMO | Attending: Hematology & Oncology

## 2022-07-05 ENCOUNTER — Ambulatory Visit (INDEPENDENT_AMBULATORY_CARE_PROVIDER_SITE_OTHER): Payer: Medicare PPO | Admitting: Podiatry

## 2022-07-05 ENCOUNTER — Inpatient Hospital Stay: Payer: Medicare HMO | Admitting: Hematology & Oncology

## 2022-07-05 DIAGNOSIS — B351 Tinea unguium: Secondary | ICD-10-CM

## 2022-07-05 DIAGNOSIS — M79675 Pain in left toe(s): Secondary | ICD-10-CM

## 2022-07-05 DIAGNOSIS — M2042 Other hammer toe(s) (acquired), left foot: Secondary | ICD-10-CM

## 2022-07-05 DIAGNOSIS — M79674 Pain in right toe(s): Secondary | ICD-10-CM | POA: Diagnosis not present

## 2022-07-05 DIAGNOSIS — E1142 Type 2 diabetes mellitus with diabetic polyneuropathy: Secondary | ICD-10-CM

## 2022-07-05 DIAGNOSIS — M2041 Other hammer toe(s) (acquired), right foot: Secondary | ICD-10-CM

## 2022-07-05 NOTE — Progress Notes (Unsigned)
Subjective: 71 year old female presents the office today for concerns of thick, discolored toenails that she cannot trim her self as well as a callus on her right foot.  No open lesions.   Objective: AAO x3, NAD DP/PT pulses palpable bilaterally, CRT less than 3 seconds Chronic appearing edema present bilateral lower extremities. Hyperkeratotic lesion right foot submetatarsal without any underlying ulceration drainage or signs of infection.   Nails are hypertrophic, dystrophic, brittle, discolored, elongated 10. No surrounding redness or drainage. Tenderness nails 1-5 bilaterally.  There is incurvation present to the right second digit toenails the nails also significantly elongated.   No open ulcerations. No significant hyperkeratotic lesions Mild hammertoes present NWB.  MMT 5/5 No pain with calf compression, warmth, erythema  Assessment: Hyperkeratotic lesion, symptomatic onychomycosis; ingrown toenail  Plan: -All treatment options discussed with the patient including all alternatives, risks, complications.  -Nails sharply debrided x10 without any complications or bleeding.  -Patient would like diabetic shoes.  I will have her scheduled for measurement. -Daily foot inspection  -Patient encouraged to call the office with any questions, concerns, change in symptoms.   Trula Slade DPM

## 2022-07-07 DIAGNOSIS — R5381 Other malaise: Secondary | ICD-10-CM | POA: Diagnosis not present

## 2022-07-07 DIAGNOSIS — I469 Cardiac arrest, cause unspecified: Secondary | ICD-10-CM | POA: Diagnosis not present

## 2022-07-08 NOTE — Addendum Note (Signed)
Addended by: Celesta Gentile R on: 07/08/2022 07:52 AM   Modules accepted: Orders

## 2022-07-10 DIAGNOSIS — G4700 Insomnia, unspecified: Secondary | ICD-10-CM | POA: Diagnosis not present

## 2022-07-10 DIAGNOSIS — F39 Unspecified mood [affective] disorder: Secondary | ICD-10-CM | POA: Diagnosis not present

## 2022-07-10 DIAGNOSIS — G931 Anoxic brain damage, not elsewhere classified: Secondary | ICD-10-CM | POA: Diagnosis not present

## 2022-07-10 DIAGNOSIS — I468 Cardiac arrest due to other underlying condition: Secondary | ICD-10-CM | POA: Diagnosis not present

## 2022-07-10 DIAGNOSIS — N184 Chronic kidney disease, stage 4 (severe): Secondary | ICD-10-CM | POA: Diagnosis not present

## 2022-07-10 DIAGNOSIS — I5030 Unspecified diastolic (congestive) heart failure: Secondary | ICD-10-CM | POA: Diagnosis not present

## 2022-07-10 DIAGNOSIS — I129 Hypertensive chronic kidney disease with stage 1 through stage 4 chronic kidney disease, or unspecified chronic kidney disease: Secondary | ICD-10-CM | POA: Diagnosis not present

## 2022-07-10 DIAGNOSIS — D469 Myelodysplastic syndrome, unspecified: Secondary | ICD-10-CM | POA: Diagnosis not present

## 2022-07-10 DIAGNOSIS — M62830 Muscle spasm of back: Secondary | ICD-10-CM | POA: Diagnosis not present

## 2022-07-14 ENCOUNTER — Encounter (HOSPITAL_COMMUNITY): Payer: Self-pay | Admitting: Emergency Medicine

## 2022-07-14 ENCOUNTER — Emergency Department (HOSPITAL_COMMUNITY): Payer: Medicare HMO

## 2022-07-14 ENCOUNTER — Other Ambulatory Visit: Payer: Self-pay

## 2022-07-14 ENCOUNTER — Emergency Department (HOSPITAL_COMMUNITY)
Admission: EM | Admit: 2022-07-14 | Discharge: 2022-07-14 | Disposition: A | Discharge status: Home / Self Care | Payer: Medicare HMO | Attending: Emergency Medicine | Admitting: Emergency Medicine

## 2022-07-14 DIAGNOSIS — R109 Unspecified abdominal pain: Secondary | ICD-10-CM | POA: Diagnosis not present

## 2022-07-14 DIAGNOSIS — N189 Chronic kidney disease, unspecified: Secondary | ICD-10-CM | POA: Diagnosis not present

## 2022-07-14 DIAGNOSIS — K5732 Diverticulitis of large intestine without perforation or abscess without bleeding: Secondary | ICD-10-CM

## 2022-07-14 DIAGNOSIS — Z79899 Other long term (current) drug therapy: Secondary | ICD-10-CM | POA: Insufficient documentation

## 2022-07-14 DIAGNOSIS — K429 Umbilical hernia without obstruction or gangrene: Secondary | ICD-10-CM | POA: Diagnosis not present

## 2022-07-14 DIAGNOSIS — R197 Diarrhea, unspecified: Secondary | ICD-10-CM | POA: Diagnosis not present

## 2022-07-14 DIAGNOSIS — K5730 Diverticulosis of large intestine without perforation or abscess without bleeding: Secondary | ICD-10-CM | POA: Diagnosis not present

## 2022-07-14 DIAGNOSIS — K5792 Diverticulitis of intestine, part unspecified, without perforation or abscess without bleeding: Secondary | ICD-10-CM | POA: Diagnosis not present

## 2022-07-14 DIAGNOSIS — E1122 Type 2 diabetes mellitus with diabetic chronic kidney disease: Secondary | ICD-10-CM | POA: Diagnosis not present

## 2022-07-14 DIAGNOSIS — I10 Essential (primary) hypertension: Secondary | ICD-10-CM | POA: Diagnosis not present

## 2022-07-14 LAB — COMPREHENSIVE METABOLIC PANEL
ALT: 12 U/L (ref 0–44)
AST: 16 U/L (ref 15–41)
Albumin: 3.5 g/dL (ref 3.5–5.0)
Alkaline Phosphatase: 84 U/L (ref 38–126)
Anion gap: 10 (ref 5–15)
BUN: 28 mg/dL — ABNORMAL HIGH (ref 8–23)
CO2: 19 mmol/L — ABNORMAL LOW (ref 22–32)
Calcium: 9.4 mg/dL (ref 8.9–10.3)
Chloride: 111 mmol/L (ref 98–111)
Creatinine, Ser: 1.74 mg/dL — ABNORMAL HIGH (ref 0.44–1.00)
GFR, Estimated: 31 mL/min — ABNORMAL LOW (ref >60)
Glucose, Bld: 166 mg/dL — ABNORMAL HIGH (ref 70–99)
Potassium: 3.8 mmol/L (ref 3.5–5.1)
Sodium: 140 mmol/L (ref 135–145)
Total Bilirubin: 0.3 mg/dL (ref 0.3–1.2)
Total Protein: 7.1 g/dL (ref 6.5–8.1)

## 2022-07-14 LAB — CBC
HCT: 34.9 % — ABNORMAL LOW (ref 36.0–46.0)
Hemoglobin: 11.3 g/dL — ABNORMAL LOW (ref 12.0–15.0)
MCH: 29.4 pg (ref 26.0–34.0)
MCHC: 32.4 g/dL (ref 30.0–36.0)
MCV: 90.9 fL (ref 80.0–100.0)
Platelets: 301 10*3/uL (ref 150–400)
RBC: 3.84 MIL/uL — ABNORMAL LOW (ref 3.87–5.11)
RDW: 13.5 % (ref 11.5–15.5)
WBC: 8.2 10*3/uL (ref 4.0–10.5)
nRBC: 0 % (ref 0.0–0.2)

## 2022-07-14 LAB — LIPASE, BLOOD: Lipase: 25 U/L (ref 11–51)

## 2022-07-14 LAB — URINALYSIS, ROUTINE W REFLEX MICROSCOPIC
Bilirubin Urine: NEGATIVE
Glucose, UA: NEGATIVE mg/dL
Hgb urine dipstick: NEGATIVE
Ketones, ur: NEGATIVE mg/dL
Leukocytes,Ua: NEGATIVE
Nitrite: NEGATIVE
Protein, ur: NEGATIVE mg/dL
Specific Gravity, Urine: 1.01 (ref 1.005–1.030)
pH: 5 (ref 5.0–8.0)

## 2022-07-14 MED ORDER — IOHEXOL 350 MG/ML SOLN
70.0000 mL | Freq: Once | INTRAVENOUS | Status: AC | PRN
  Administered 2022-07-14: 70 mL via INTRAVENOUS

## 2022-07-14 MED ORDER — AMOXICILLIN-POT CLAVULANATE 875-125 MG PO TABS
1.0000 | ORAL_TABLET | Freq: Once | ORAL | Status: AC
  Administered 2022-07-14: 1 via ORAL
  Filled 2022-07-14: qty 1

## 2022-07-14 MED ORDER — AMOXICILLIN-POT CLAVULANATE 875-125 MG PO TABS: 1.0000 | ORAL_TABLET | Freq: Two times a day (BID) | ORAL | 0 refills | Status: DC

## 2022-07-14 MED ORDER — SODIUM CHLORIDE 0.9 % IV BOLUS
1000.0000 mL | Freq: Once | INTRAVENOUS | Status: AC
  Administered 2022-07-14: 1000 mL via INTRAVENOUS

## 2022-07-14 NOTE — Discharge Instructions (Addendum)
You have diverticulitis.  Take Augmentin twice daily for a week.  You may take Imodium for diarrhea up to 10 times a day  See your doctor for follow-up  Return to ER if you have worse abdominal pain, vomiting, fever, uncontrolled diarrhea

## 2022-07-14 NOTE — ED Provider Notes (Signed)
[place] EMERGENCY DEPARTMENT Provider Note   CSN: [id number] Arrival date & time: 07/14/22  1235     History  Chief Complaint  Patient presents with   Diarrhea    [name] is a 71 y.o. female history of CKD, diabetes, hyperlipidemia, A-fib, PE, previous GI bleed here presenting with abdominal pain.  Patient states that she has home health and also physical therapy comes to the house.  She was concerned that she may caught of stomach virus from them.  She states that for the last 2 days she has been having watery diarrhea.  She denies any blood in her stool.  She had no vomiting but just felt nauseated.  Patient was recently admitted for GI bleed from diverticulosis and hemorrhoids.  Patient actually had embolization at that time.  Patient denies any review of C. difficile in the past and not recently on antibiotics  The history is provided by the patient.       Home Medications Prior to Admission medications   Medication Sig Start Date End Date Taking? Authorizing Provider  acetaminophen (TYLENOL) 325 MG tablet Take 2 tablets (650 mg total) by mouth every 4 (four) hours as needed for fever (greater than 37.6 C). Patient taking differently: Take 650 mg by mouth daily as needed (pain). 01/07/22   Angiulli, Lavon Paganini, PA-C  albuterol (VENTOLIN HFA) 108 (90 Base) MCG/ACT inhaler Inhale 1-2 puffs into the lungs every 4 (four) hours as needed for wheezing or shortness of breath. Patient taking differently: Inhale 2 puffs into the lungs daily as needed for wheezing or shortness of breath. 02/14/22   Medina-Vargas, Monina C, NP  amLODipine (NORVASC) 10 MG tablet Take 10 mg by mouth daily. 03/06/22   [provider]  atorvastatin (LIPITOR) 20 MG tablet Take 1 tablet (20 mg total) by mouth daily. 02/14/22   Medina-Vargas, Monina C, NP  B Complex-C (B-COMPLEX WITH VITAMIN C) tablet Take 1 tablet by mouth daily. 01/08/22   Angiulli, Lavon Paganini, PA-C  dicyclomine  (BENTYL) 10 MG capsule Take 1 capsule (10 mg total) by mouth 3 (three) times daily before meals. Patient taking differently: Take 10 mg by mouth 2 (two) times daily as needed (stomach cramps). 02/14/22   Medina-Vargas, Monina C, NP  DULoxetine (CYMBALTA) 20 MG capsule Take 1 capsule (20 mg total) by mouth daily. Patient not taking: Reported on 06/02/2022 02/14/22   Medina-Vargas, Monina C, NP  Ferrous Sulfate (IRON PO) Take 1 tablet by mouth daily.    [provider]  FLOVENT HFA 220 MCG/ACT inhaler Inhale 2 puffs into the lungs as needed (wheezing/SOB). 03/01/22   [provider]  folic acid (FOLVITE) 1 MG tablet Take 2 tablets (2 mg total) by mouth daily. Patient not taking: Reported on 06/02/2022 02/14/22   Medina-Vargas, Monina C, NP  Glucerna (GLUCERNA) LIQD Take 237 mLs by mouth 2 (two) times daily between meals.    [provider]  lidocaine (LIDODERM) 5 % Place 2 patches onto the skin daily. Remove & Discard patch within 12 hours or as directed by MD Patient taking differently: Place 1 patch onto the skin daily as needed (pain). Remove & Discard patch within 12 hours or as directed by MD 02/14/22   Medina-Vargas, Monina C, NP  melatonin 5 MG TABS Take 1 tablet (5 mg total) by mouth every evening. Patient not taking: Reported on 06/02/2022 02/14/22   Medina-Vargas, Monina C, NP  ondansetron (ZOFRAN) 4 MG tablet Take 4 mg by  mouth as needed for nausea or vomiting. 06/01/22   [provider]  oxyCODONE (OXY IR/ROXICODONE) 5 MG immediate release tablet Take 5 mg by mouth daily as needed for severe pain.    [provider]  pantoprazole (PROTONIX) 40 MG tablet Take 1 tablet (40 mg total) by mouth 2 (two) times daily. 02/14/22   Medina-Vargas, Monina C, NP  PARoxetine (PAXIL) 40 MG tablet Take by mouth daily. Patient not taking: Reported on 06/02/2022 03/06/22   [provider]  simvastatin (ZOCOR) 40 MG tablet Take 40 mg by mouth daily.    [provider]  temazepam (RESTORIL) 7.5 MG capsule Take 1 capsule (7.5 mg total) by mouth at bedtime as needed for sleep. Patient not taking: Reported on 06/02/2022 03/20/22   Volanda Napoleon, MD  Vitamin D, Ergocalciferol, (DRISDOL) 1.25 MG (50000 UNIT) CAPS capsule Take 1 capsule (50,000 Units total) by mouth once a week. 01/07/22   Angiulli, Lavon Paganini, PA-C      Allergies    Darvon [propoxyphene], Hydrocodone bit-homatrop mbr, Metformin, Pentazocine, Percocet [oxycodone-acetaminophen], Sulfa antibiotics, Tetracaine hcl, Tetracyclines & related, Tramadol, Benadryl [diphenhydramine], Other, and Aspirin    Review of Systems   Review of Systems  Gastrointestinal:  Positive for diarrhea.  All other systems reviewed and are negative.   Physical Exam Updated Vital Signs BP 130/80   Pulse 73   Temp 98.1 F (36.7 C) (Oral)   Resp 10   Ht '5\' 5"'$  (1.651 m)   Wt 58.5 kg   SpO2 100%   BMI 21.47 kg/m  Physical Exam Vitals and nursing note reviewed.  HENT:     Head: Normocephalic.     Nose: Nose normal.     Mouth/Throat:     Mouth: Mucous membranes are dry.  Eyes:     Extraocular Movements: Extraocular movements intact.     Pupils: Pupils are equal, round, and reactive to light.  Cardiovascular:     Rate and Rhythm: Normal rate and regular rhythm.     Pulses: Normal pulses.     Heart sounds: Normal heart sounds.  Pulmonary:     Effort: Pulmonary effort is normal.     Breath sounds: Normal breath sounds.  Abdominal:     General: Abdomen is flat.     Palpations: Abdomen is soft.     Comments: Mild diffuse tenderness   Musculoskeletal:        General: Normal range of motion.     Cervical back: Normal range of motion and neck supple.  Skin:    General: Skin is warm.     Capillary Refill: Capillary refill takes less than 2 seconds.  Neurological:     General: No focal deficit present.     Mental Status: She is oriented to person, place, and time.  Psychiatric:        Mood and Affect: Mood  normal.        Behavior: Behavior normal.     ED Results / Procedures / Treatments   Labs (all labs ordered are listed, but only abnormal results are displayed) Labs Reviewed  COMPREHENSIVE METABOLIC PANEL - Abnormal; Notable for the following components:      Result Value   CO2 19 (*)    Glucose, Bld 166 (*)    BUN 28 (*)    Creatinine, Ser 1.74 (*)    GFR, Estimated 31 (*)    All other components within normal limits  CBC - Abnormal; Notable for the following components:  RBC 3.84 (*)    Hemoglobin 11.3 (*)    HCT 34.9 (*)    All other components within normal limits  URINALYSIS, ROUTINE W REFLEX MICROSCOPIC - Abnormal; Notable for the following components:   APPearance CLOUDY (*)    All other components within normal limits  LIPASE, BLOOD    EKG EKG Interpretation  Date/Time:  Sunday July 14 2022 12:41:36 EDT Ventricular Rate:  89 PR Interval:  128 QRS Duration: 76 QT Interval:  360 QTC Calculation: 438 R Axis:   122 Text Interpretation: Normal sinus rhythm Right axis deviation Abnormal ECG When compared with ECG of 02-Jun-2022 10:32, PREVIOUS ECG IS PRESENT Confirmed by Wandra Arthurs [phone]) on 07/14/2022 3:00:37 PM  Radiology No results found.  Procedures Procedures    Medications Ordered in ED Medications  sodium chloride 0.9 % bolus 1,000 mL (1,000 mLs Intravenous New Bag/Given 07/14/22 1521)    ED Course/ Medical Decision Making/ A&P                           Medical Decision Making [name] is a 71 y.o. female here presenting with diarrhea and abdominal pain.  I think likely viral gastroenteritis.  Patient has no history of C. difficile.  She has recent GI bleed but has no blood in her stool right now.  Plan to get CBC and CMP and CT abdomen pelvis.  Will hydrate and reassess.  6:47 PM I reviewed patient's labs and independently interpreted imaging studies.  Creatinine is 1.7 and baseline is around 1.6.  CT abdomen pelvis showed  uncomplicated diverticulitis.  Patient was given Augmentin.  She is tolerating p.o. well.  Will discharge home with Augmentin and I told her she can take some Imodium as needed.  She has no history of C. difficile.  Problems Addressed: Diverticulitis of colon: acute illness or injury  Amount and/or Complexity of Data Reviewed Labs: ordered. Decision-making details documented in ED Course. Radiology: ordered and independent interpretation performed. Decision-making details documented in ED Course.  Risk Prescription drug management.    Final Clinical Impression(s) / ED Diagnoses Final diagnoses:  None    Rx / DC Orders ED Discharge Orders     None         Drenda Freeze, MD 07/14/22 Valerie Roys

## 2022-07-14 NOTE — ED Notes (Signed)
[name] Daughter [phone] requesting an update on the patient

## 2022-07-14 NOTE — ED Triage Notes (Signed)
Patient here with complaint of diarrhea and dizziness since Friday. Patient states she has tried imodium and pepto-bismol with brief temporary relief. Patient is alert, oriented, and in no apparent distress at this time.

## 2022-07-18 DIAGNOSIS — K5792 Diverticulitis of intestine, part unspecified, without perforation or abscess without bleeding: Secondary | ICD-10-CM | POA: Diagnosis not present

## 2022-07-26 DIAGNOSIS — K921 Melena: Secondary | ICD-10-CM | POA: Diagnosis not present

## 2022-07-26 DIAGNOSIS — Z1211 Encounter for screening for malignant neoplasm of colon: Secondary | ICD-10-CM | POA: Diagnosis not present

## 2022-07-26 DIAGNOSIS — D649 Anemia, unspecified: Secondary | ICD-10-CM | POA: Diagnosis not present

## 2022-08-19 ENCOUNTER — Telehealth: Payer: Self-pay

## 2022-08-19 NOTE — Patient Outreach (Signed)
  Care Coordination   Initial Visit Note   08/19/2022 Name: [name] MRN: [id number] DOB: [birth year]  [name] is a 71 y.o. year old female who sees Maria Pillar, MD for primary care. I spoke with  [name] by phone today.  What matters to the patients health and wellness today?  Patient states she is doing well. She is working with a supplemental plan that she has through [facility] that covers for aide to come into home fr [facility]. She has to get paperwork done from PCP. Patient will follow up. She denies any THN/RN CM needs or concerns at this time.    Goals Addressed             This Visit's Progress    COMPLETED: Care Coordination-no follow up required        Care Coordination Interventions: Advised patient to schedule annual wellness visit-attempted to schedule while on call but office closed for lunch-pt will call office when it opens back up Provided education to patient re: [facility] services Assessed social determinant of health barriers          SDOH assessments and interventions completed:  Yes  SDOH Interventions Today    Flowsheet Row Most Recent Value  SDOH Interventions   Food Insecurity Interventions Intervention Not Indicated  Transportation Interventions Intervention Not Indicated        Care Coordination Interventions Activated:  Yes  Care Coordination Interventions:  Yes, provided   Follow up plan: No further intervention required.   Encounter Outcome:  Pt. Visit Completed   Maria Montgomery, RN,BSN,CCM [facility] Telephonic Care Management Coordinator Direct Phone: [phone] Toll Free: [phone] Fax: [phone]

## 2022-08-26 ENCOUNTER — Other Ambulatory Visit: Payer: Self-pay | Admitting: Adult Health

## 2022-08-26 DIAGNOSIS — K219 Gastro-esophageal reflux disease without esophagitis: Secondary | ICD-10-CM

## 2022-09-02 DIAGNOSIS — D649 Anemia, unspecified: Secondary | ICD-10-CM | POA: Diagnosis not present

## 2022-09-06 DIAGNOSIS — H43813 Vitreous degeneration, bilateral: Secondary | ICD-10-CM | POA: Diagnosis not present

## 2022-09-06 DIAGNOSIS — H524 Presbyopia: Secondary | ICD-10-CM | POA: Diagnosis not present

## 2022-09-06 DIAGNOSIS — H2513 Age-related nuclear cataract, bilateral: Secondary | ICD-10-CM | POA: Diagnosis not present

## 2022-09-06 DIAGNOSIS — H25013 Cortical age-related cataract, bilateral: Secondary | ICD-10-CM | POA: Diagnosis not present

## 2022-09-06 DIAGNOSIS — E119 Type 2 diabetes mellitus without complications: Secondary | ICD-10-CM | POA: Diagnosis not present

## 2022-09-13 DIAGNOSIS — Z8674 Personal history of sudden cardiac arrest: Secondary | ICD-10-CM | POA: Diagnosis not present

## 2022-09-13 DIAGNOSIS — I129 Hypertensive chronic kidney disease with stage 1 through stage 4 chronic kidney disease, or unspecified chronic kidney disease: Secondary | ICD-10-CM | POA: Diagnosis not present

## 2022-09-13 DIAGNOSIS — J4520 Mild intermittent asthma, uncomplicated: Secondary | ICD-10-CM | POA: Diagnosis not present

## 2022-09-13 DIAGNOSIS — G4700 Insomnia, unspecified: Secondary | ICD-10-CM | POA: Diagnosis not present

## 2022-09-13 DIAGNOSIS — K922 Gastrointestinal hemorrhage, unspecified: Secondary | ICD-10-CM | POA: Diagnosis not present

## 2022-09-13 DIAGNOSIS — E1121 Type 2 diabetes mellitus with diabetic nephropathy: Secondary | ICD-10-CM | POA: Diagnosis not present

## 2022-09-13 DIAGNOSIS — N184 Chronic kidney disease, stage 4 (severe): Secondary | ICD-10-CM | POA: Diagnosis not present

## 2022-09-13 DIAGNOSIS — G629 Polyneuropathy, unspecified: Secondary | ICD-10-CM | POA: Diagnosis not present

## 2022-09-13 DIAGNOSIS — E7800 Pure hypercholesterolemia, unspecified: Secondary | ICD-10-CM | POA: Diagnosis not present

## 2022-09-26 DIAGNOSIS — I700 Atherosclerosis of aorta: Secondary | ICD-10-CM | POA: Diagnosis not present

## 2022-09-26 DIAGNOSIS — I5030 Unspecified diastolic (congestive) heart failure: Secondary | ICD-10-CM | POA: Diagnosis not present

## 2022-09-26 DIAGNOSIS — G4700 Insomnia, unspecified: Secondary | ICD-10-CM | POA: Diagnosis not present

## 2022-09-26 DIAGNOSIS — M79672 Pain in left foot: Secondary | ICD-10-CM | POA: Diagnosis not present

## 2022-09-26 DIAGNOSIS — E1121 Type 2 diabetes mellitus with diabetic nephropathy: Secondary | ICD-10-CM | POA: Diagnosis not present

## 2022-09-26 DIAGNOSIS — N184 Chronic kidney disease, stage 4 (severe): Secondary | ICD-10-CM | POA: Diagnosis not present

## 2022-09-26 DIAGNOSIS — E1165 Type 2 diabetes mellitus with hyperglycemia: Secondary | ICD-10-CM | POA: Diagnosis not present

## 2022-09-27 ENCOUNTER — Other Ambulatory Visit: Payer: Self-pay

## 2022-09-27 ENCOUNTER — Other Ambulatory Visit: Payer: Self-pay | Admitting: Internal Medicine

## 2022-09-27 ENCOUNTER — Ambulatory Visit
Admission: RE | Admit: 2022-09-27 | Discharge: 2022-09-27 | Disposition: A | Discharge status: Home / Self Care | Payer: Medicare HMO | Source: Ambulatory Visit | Attending: Internal Medicine | Admitting: Internal Medicine

## 2022-09-27 DIAGNOSIS — M79672 Pain in left foot: Secondary | ICD-10-CM

## 2022-09-27 DIAGNOSIS — S99922A Unspecified injury of left foot, initial encounter: Secondary | ICD-10-CM | POA: Diagnosis not present

## 2022-10-03 ENCOUNTER — Ambulatory Visit: Payer: Medicare PPO | Admitting: Podiatry

## 2022-10-03 ENCOUNTER — Ambulatory Visit (INDEPENDENT_AMBULATORY_CARE_PROVIDER_SITE_OTHER): Payer: Medicare HMO | Admitting: Podiatry

## 2022-10-03 DIAGNOSIS — B351 Tinea unguium: Secondary | ICD-10-CM

## 2022-10-03 DIAGNOSIS — M79674 Pain in right toe(s): Secondary | ICD-10-CM

## 2022-10-03 DIAGNOSIS — M79675 Pain in left toe(s): Secondary | ICD-10-CM | POA: Diagnosis not present

## 2022-10-03 DIAGNOSIS — S9032XA Contusion of left foot, initial encounter: Secondary | ICD-10-CM

## 2022-10-03 DIAGNOSIS — E1142 Type 2 diabetes mellitus with diabetic polyneuropathy: Secondary | ICD-10-CM | POA: Diagnosis not present

## 2022-10-03 NOTE — Progress Notes (Signed)
Subjective: Chief Complaint  Patient presents with   Diabetes    Diabetic foot care, A1c-9.0 BG- not taken it,  nail trim, patient came in today for a foot injury that happened Nov 25 th 2023, cast iron pan fell on her left foot, patient is still having some pain    71 year old female presents the office with above concerns.  She was trying to cook cornbread and the skillet fell on the foot. It was bursied but getting a little better.  No open lesions that she reports.  No treatment.  Nails are also thickened elongated she has difficulty trimming herself.  No drainage or pus.   Objective: AAO x3, NAD DP/PT pulses palpable bilaterally, CRT less than 3 seconds Nails are hypertrophic, dystrophic, brittle, discolored, elongated 10. No surrounding redness or drainage. Tenderness nails 1-5 bilaterally. No open lesions or pre-ulcerative lesions are identified today. There is tenderness palpation on dorsal aspect the left forefoot/toes.  Minimal bruising present.  Mild edema still present. No pain with calf compression, swelling, warmth, erythema  Assessment: Left foot contusion, symptomatic onychomycosis  Plan: -All treatment options discussed with the patient including all alternatives, risks, complications.  -Independent review the x-rays that she had previously in her foot.  No evidence of acute fracture.  Discussed wearing shoes that are stiffer to help prevent pressure and offloading.  Continue ice, elevate as well as compression.  Symptoms already improving and I do expect him to continue to improve.  There is no resolution with this over the next couple weeks to let me know. -Shepard debride the nails x 2 without any complications or bleeding. -Patient encouraged to call the office with any questions, concerns, change in symptoms.

## 2022-10-04 ENCOUNTER — Encounter: Payer: Self-pay | Admitting: Internal Medicine

## 2022-10-04 ENCOUNTER — Ambulatory Visit: Payer: Medicare HMO | Admitting: Internal Medicine

## 2022-10-04 VITALS — BP 137/70 | HR 82 | Ht 65.0 in | Wt 147.2 lb

## 2022-10-04 DIAGNOSIS — I10 Essential (primary) hypertension: Secondary | ICD-10-CM

## 2022-10-04 DIAGNOSIS — E119 Type 2 diabetes mellitus without complications: Secondary | ICD-10-CM

## 2022-10-04 DIAGNOSIS — Z8674 Personal history of sudden cardiac arrest: Secondary | ICD-10-CM | POA: Insufficient documentation

## 2022-10-04 DIAGNOSIS — Z794 Long term (current) use of insulin: Secondary | ICD-10-CM | POA: Diagnosis not present

## 2022-10-04 MED ORDER — METOPROLOL SUCCINATE ER 25 MG PO TB24: 12.5000 mg | ORAL_TABLET | Freq: Every day | ORAL | 6 refills | Status: DC

## 2022-10-04 NOTE — Progress Notes (Signed)
Primary Physician/Referring:  [name], MD  Patient ID: [name], female    DOB: Mar 01, 1951, 71 y.o.   MRN: [id number]  Chief Complaint  Patient presents with   history of cardia arrest   New Patient (Initial Visit)        HPI:    [name]  is a 71 y.o. female with past medical history significant for hypertension, diabetes, and cardiac arrest on New Year's Eve almost 1 year ago who is here to establish care with cardiology.  Patient had cardiac arrest at home, compressions were started by EMS and she did have a shockable rhythm.  Total downtime was approximately 30 minutes and at that time it felt like neurological recovery would be poor.  Cardiac catheterization was deferred at that time, especially since her cardiac arrest was due to massive pulmonary embolism with significant cor pulmonale.  Patient was on Eliquis for some time after this however she ended up having bloody stools so had to stop taking it because she could not tolerate.  No reason was found for why patient had bloody stools.  She was in the hospital for about 2 months after cardiac arrest, patient was on hemodialysis for the duration of that time however she began to urinate on her own and her kidneys recovered prior to being discharged home.  Patient is currently not getting any sort of dialysis and her kidney function has been well-controlled.  Patient has not seen a cardiologist since her cardiac arrest.  Her echocardiograms in January, February, and March of this year have shown improved ejection fraction and recovered RV function.  Patient states that she has been doing pretty well, but she had to relearn how to do everything, even walk.  She states her memory is almost completely back to normal at this time.  She uses a cane to ambulate.  Otherwise patient denies chest pain, shortness of breath, palpitations, diaphoresis, syncope, orthopnea, edema, PND, claudication.  Past Medical History:  Diagnosis  Date   Anxiety    Arthritis    Asthma    Cardiac arrest ([place]) 10/28/2021   Depression    Diabetes mellitus without complication (HCC)    Fibromyalgia    GERD (gastroesophageal reflux disease)    Hyperlipemia    Hypertension    PONV (postoperative nausea and vomiting)    Past Surgical History:  Procedure Laterality Date   COLONOSCOPY     COLONOSCOPY WITH PROPOFOL N/A 02/20/2022   Procedure: COLONOSCOPY WITH PROPOFOL;  Surgeon: Otis Brace, MD;  Location: [place];  Service: Gastroenterology;  Laterality: N/A;   COLONOSCOPY WITH PROPOFOL N/A 06/06/2022   Procedure: COLONOSCOPY WITH PROPOFOL;  Surgeon: Arta Silence, MD;  Location: [place];  Service: Gastroenterology;  Laterality: N/A;   DILATION AND CURETTAGE OF UTERUS     ESOPHAGOGASTRODUODENOSCOPY N/A 02/20/2022   Procedure: ESOPHAGOGASTRODUODENOSCOPY (EGD);  Surgeon: Otis Brace, MD;  Location: [facility] ENDOSCOPY;  Service: Gastroenterology;  Laterality: N/A;   IR EMBO ART  VEN HEMORR LYMPH EXTRAV  INC GUIDE ROADMAPPING  11/01/2021   IR FLUORO GUIDE CV LINE RIGHT  11/20/2021   IR IVC FILTER PLMT / S&I /IMG GUID/MOD SED  06/08/2022   IR REMOVAL TUN CV CATH W/O FL  12/20/2021   IR THROMBECT PRIM MECH INIT (INCLU) MOD SED  10/29/2021   IR US GUIDE VASC ACCESS RIGHT  10/29/2021   IR US GUIDE VASC ACCESS RIGHT  11/01/2021   IR US GUIDE VASC ACCESS RIGHT  11/21/2021  KNEE ARTHROSCOPY  [phone]   left   POLYPECTOMY  02/20/2022   Procedure: POLYPECTOMY;  Surgeon: Otis Brace, MD;  Location: [place];  Service: Gastroenterology;;   SHOULDER ARTHROSCOPY     left   TRIGGER FINGER RELEASE  08/13/2012   Procedure: RELEASE TRIGGER FINGER/A-1 PULLEY;  Surgeon: [name]., MD;  Location: [place];  Service: Orthopedics;  Laterality: Right;  long finger   TUBAL LIGATION     Family History  Problem Relation Age of Onset   Diabetes Mother    Heart attack Father    Diabetes Sister    Throat cancer  Brother    Hypertension Daughter     Social History   Tobacco Use   Smoking status: Former    Packs/day: 0.50    Years: 20.00    Total pack years: 10.00    Types: Cigarettes    Quit date: 08/10/2010    Years since quitting: 12.1   Smokeless tobacco: Never  Substance Use Topics   Alcohol use: No   Marital Status: Divorced  ROS  Review of Systems  Cardiovascular:  Negative for chest pain, claudication, dyspnea on exertion, leg swelling, palpitations and syncope.   Objective  Blood pressure 137/70, pulse 82, height '5\' 5"'$  (1.651 m), weight 147 lb 3.2 oz (66.8 kg), SpO2 99 %. Body mass index is 24.5 kg/m.     10/04/2022   12:41 PM 07/14/2022    6:30 PM 07/14/2022    6:00 PM  Vitals with BMI  Height '5\' 5"'$     Weight 147 lbs 3 oz    BMI 54.6    Systolic 568 127 517  Diastolic 70 80 79  Pulse 82 77 77     Physical Exam Vitals reviewed.  HENT:     Head: Normocephalic and atraumatic.  Cardiovascular:     Rate and Rhythm: Normal rate and regular rhythm.     Pulses: Normal pulses.     Heart sounds: Normal heart sounds. No murmur heard. Pulmonary:     Effort: Pulmonary effort is normal.     Breath sounds: Normal breath sounds.  Abdominal:     General: Bowel sounds are normal.  Musculoskeletal:     Right lower leg: No edema.     Left lower leg: No edema.  Skin:    General: Skin is warm and dry.  Neurological:     Mental Status: She is alert.     Medications and allergies   Allergies  Allergen Reactions   Darvon [Propoxyphene] Itching   Hydrocodone Bit-Homatrop Mbr Itching   Metformin Nausea And Vomiting   Pentazocine Other (See Comments)      jitters   Percocet [Oxycodone-Acetaminophen] Itching   Sulfa Antibiotics Hives   Tetracaine Hcl Itching   Tetracyclines & Related Hives   Tramadol Itching   Benadryl [Diphenhydramine] Itching and Other (See Comments)    Can only take dye free. States the dye causes itching.   Other     Other reaction(s): Unknown.  States reaction was to nasal spray that caused pupils to shrink   Aspirin Nausea And Vomiting     Medication list after today's encounter   Current Outpatient Medications:    acetaminophen (TYLENOL) 325 MG tablet, Take 2 tablets (650 mg total) by mouth every 4 (four) hours as needed for fever (greater than 37.6 C). (Patient taking differently: Take 650 mg by mouth daily as needed (pain).), Disp: , Rfl:    albuterol (VENTOLIN HFA) 108 (  90 Base) MCG/ACT inhaler, Inhale 1-2 puffs into the lungs every 4 (four) hours as needed for wheezing or shortness of breath. (Patient taking differently: Inhale 2 puffs into the lungs daily as needed for wheezing or shortness of breath.), Disp: 8.5 g, Rfl: 0   ALPRAZolam (XANAX) 0.25 MG tablet, Take 0.25 mg by mouth daily as needed., Disp: , Rfl:    amLODipine (NORVASC) 10 MG tablet, Take 10 mg by mouth daily., Disp: , Rfl:    atorvastatin (LIPITOR) 20 MG tablet, Take 1 tablet (20 mg total) by mouth daily., Disp: 30 tablet, Rfl: 0   B Complex-C (B-COMPLEX WITH VITAMIN C) tablet, Take 1 tablet by mouth daily., Disp: 30 tablet, Rfl: 0   dicyclomine (BENTYL) 10 MG capsule, Take 1 capsule (10 mg total) by mouth 3 (three) times daily before meals. (Patient taking differently: Take 10 mg by mouth 2 (two) times daily as needed (stomach cramps).), Disp: 90 capsule, Rfl: 0   Ferrous Sulfate (IRON PO), Take 1 tablet by mouth daily., Disp: , Rfl:    FLOVENT HFA 220 MCG/ACT inhaler, Inhale 2 puffs into the lungs as needed (wheezing/SOB)., Disp: , Rfl:    folic acid (FOLVITE) 1 MG tablet, Take 2 tablets (2 mg total) by mouth daily., Disp: 60 tablet, Rfl: 0   Glucerna (GLUCERNA) LIQD, Take 237 mLs by mouth 2 (two) times daily between meals., Disp: , Rfl:    LANTUS SOLOSTAR 100 UNIT/ML Solostar Pen, Inject 20 Units into the skin daily., Disp: , Rfl:    lidocaine (LIDODERM) 5 %, Place 2 patches onto the skin daily. Remove & Discard patch within 12 hours or as directed by MD  (Patient taking differently: Place 1 patch onto the skin daily as needed (pain). Remove & Discard patch within 12 hours or as directed by MD), Disp: 60 patch, Rfl: 0   metoprolol succinate (TOPROL XL) 25 MG 24 hr tablet, Take 0.5 tablets (12.5 mg total) by mouth daily., Disp: 30 tablet, Rfl: 6   pantoprazole (PROTONIX) 40 MG tablet, Take 1 tablet (40 mg total) by mouth 2 (two) times daily., Disp: 60 tablet, Rfl: 0   temazepam (RESTORIL) 7.5 MG capsule, Take 1 capsule (7.5 mg total) by mouth at bedtime as needed for sleep., Disp: 30 capsule, Rfl: 0   Vitamin D, Ergocalciferol, (DRISDOL) 1.25 MG (50000 UNIT) CAPS capsule, Take 1 capsule (50,000 Units total) by mouth once a week., Disp: 5 capsule, Rfl: 0  Laboratory examination:   Lab Results  Component Value Date   NA 140 07/14/2022   K 3.8 07/14/2022   CO2 19 (L) 07/14/2022   GLUCOSE 166 (H) 07/14/2022   BUN 28 (H) 07/14/2022   CREATININE 1.74 (H) 07/14/2022   CALCIUM 9.4 07/14/2022   GFRNONAA 31 (L) 07/14/2022       Latest Ref Rng & Units 07/14/2022   12:55 PM 06/09/2022    1:44 AM 06/08/2022    2:18 AM  CMP  Glucose 70 - 99 mg/dL 166  202  164   BUN 8 - 23 mg/dL '28  13  12   '$ Creatinine 0.44 - 1.00 mg/dL 1.74  1.69  1.76   Sodium 135 - 145 mmol/L 140  142  142   Potassium 3.5 - 5.1 mmol/L 3.8  3.5  3.4   Chloride 98 - 111 mmol/L 111  107  111   CO2 22 - 32 mmol/L '19  28  27   '$ Calcium 8.9 - 10.3 mg/dL 9.4  7.9  7.7   Total Protein 6.5 - 8.1 g/dL 7.1     Total Bilirubin 0.3 - 1.2 mg/dL 0.3     Alkaline Phos 38 - 126 U/L 84     AST 15 - 41 U/L 16     ALT 0 - 44 U/L 12         Latest Ref Rng & Units 07/14/2022   12:55 PM 06/08/2022   10:48 AM 06/06/2022    4:43 PM  CBC  WBC 4.0 - 10.5 K/uL 8.2  8.2  10.6   Hemoglobin 12.0 - 15.0 g/dL 11.3  10.6  11.0   Hematocrit 36.0 - 46.0 % 34.9  31.0  32.4   Platelets 150 - 400 K/uL 301  173  160     Lipid Panel Recent Labs    11/10/21 0459 11/12/21 0432 11/15/21 0407  TRIG 136  264* 154*    HEMOGLOBIN A1C Lab Results  Component Value Date   HGBA1C 6.3 (H) 01/09/2022   MPG 134 01/09/2022   TSH Recent Labs    01/23/22 0000  TSH 1.26    External labs:     Radiology:    Cardiac Studies:   10/28/2021 ECHO IMPRESSIONS   1. Right ventricular function is abnormal. In the setting of cardiac  arrest, would rule out pulmonary embolism if clinically indicated.   2. Left ventricular ejection fraction, by estimation, is 70 to 75%. The  left ventricle has hyperdynamic function. The left ventricle has no  regional wall motion abnormalities. There is moderate concentric left  ventricular hypertrophy. Left ventricular  diastolic parameters are indeterminate.   3. Hypokinesis of the mid-apical right ventricle with normal systolic  function at the base. This pattern is opposite of McConnell's sign, which  can be seen in acute pulmonary embolism. Right ventricular systolic  function is moderately reduced. The right   ventricular size is normal. There is severely elevated pulmonary artery  systolic pressure.   4. Systolic anterior motion (SAM) of the mitral valve. The mitral valve  is normal in structure. No evidence of mitral valve regurgitation. No  evidence of mitral stenosis.   5. Tricuspid valve regurgitation is moderate to severe.   6. The aortic valve is tricuspid. Aortic valve regurgitation is not  visualized. No aortic stenosis is present.   7. The inferior vena cava is normal in size with <50% respiratory  variability, suggesting right atrial pressure of 8 mmHg.     12/18/2021 ECHO IMPRESSIONS   1. Left ventricular ejection fraction, by estimation, is 60 to 65%. The  left ventricle has normal function. The left ventricle has no regional  wall motion abnormalities. There is mild concentric left ventricular  hypertrophy. Left ventricular diastolic  parameters are consistent with Grade I diastolic dysfunction (impaired  relaxation).   2. Right  ventricular systolic function is normal. The right ventricular  size is normal.   3. The mitral valve is normal in structure. No evidence of mitral valve  regurgitation. No evidence of mitral stenosis.   4. The aortic valve is normal in structure. Aortic valve regurgitation is  not visualized. No aortic stenosis is present.   5. The inferior vena cava is normal in size with greater than 50%  respiratory variability, suggesting right atrial pressure of 3 mmHg.     01/10/2022 ECHO IMPRESSIONS   1. Left ventricular ejection fraction, by estimation, is 60 to 65%. The  left ventricle has normal function. The left ventricle has no regional  wall motion  abnormalities. Left ventricular diastolic parameters are  consistent with Grade I diastolic  dysfunction (impaired relaxation).   2. Right ventricular systolic function is normal. The right ventricular  size is normal.   3. The mitral valve is normal in structure. No evidence of mitral valve  regurgitation. No evidence of mitral stenosis.   4. The aortic valve is tricuspid. Aortic valve regurgitation is not  visualized. No aortic stenosis is present.      EKG:   10/04/2022: Sinus Rhythm  Low voltage in limb leads. -Left atrial enlargement.  Assessment     ICD-10-CM   1. History of cardiac arrest  Z86.74 EKG 12-Lead    PCV MYOCARDIAL PERFUSION WO LEXISCAN    2. Essential hypertension  I10 PCV MYOCARDIAL PERFUSION WO LEXISCAN    3. Type 2 diabetes mellitus without complication, with long-term current use of insulin (HCC)  E11.9 PCV MYOCARDIAL PERFUSION WO LEXISCAN   Z79.4        Orders Placed This Encounter  Procedures   PCV MYOCARDIAL PERFUSION WO LEXISCAN    Standing Status:   Future    Standing Expiration Date:   12/05/2022   EKG 12-Lead    Meds ordered this encounter  Medications   metoprolol succinate (TOPROL XL) 25 MG 24 hr tablet    Sig: Take 0.5 tablets (12.5 mg total) by mouth daily.    Dispense:  30 tablet     Refill:  6    Medications Discontinued During This Encounter  Medication Reason   amoxicillin-clavulanate (AUGMENTIN) 875-125 MG tablet Completed Course   DULoxetine (CYMBALTA) 20 MG capsule Completed Course   melatonin 5 MG TABS Completed Course   ondansetron (ZOFRAN) 4 MG tablet Completed Course   oxyCODONE (OXY IR/ROXICODONE) 5 MG immediate release tablet Completed Course   PARoxetine (PAXIL) 40 MG tablet Completed Course   simvastatin (ZOCOR) 40 MG tablet Change in therapy     Recommendations:   ALVERDA NAZZARO is a 71 y.o.  female with history of cardiac arrest 10/28/2021   History of cardiac arrest Total downtime 30 minutes Cause of arrest was massive pulmonary embolus causing cor pulmonale Patient had extensive hospital course, stayed for 2 months She was on hemodialysis for the majority of that time Patient was also on Eliquis for while after but she had bloody stools and therefore it was stopped Patient has recovered very nicely Nuclear stress test ordered   Essential hypertension Continue current medications, will add low-dose beta-blocker Encourage low-sodium diet, less than 2000 mg daily. Follow-up in 1-2 months or sooner if needed.   Type 2 diabetes mellitus without complication, with long-term current use of insulin [facility]) Continue current regimen, PCP following     Maria Flock, DO, [facility]  10/04/2022, 2:02 PM Office: [phone] Pager: [phone]

## 2022-10-07 ENCOUNTER — Ambulatory Visit: Payer: Medicare HMO

## 2022-10-07 DIAGNOSIS — Z09 Encounter for follow-up examination after completed treatment for conditions other than malignant neoplasm: Secondary | ICD-10-CM | POA: Diagnosis not present

## 2022-10-07 DIAGNOSIS — Z8674 Personal history of sudden cardiac arrest: Secondary | ICD-10-CM

## 2022-10-07 DIAGNOSIS — Z794 Long term (current) use of insulin: Secondary | ICD-10-CM

## 2022-10-07 DIAGNOSIS — I10 Essential (primary) hypertension: Secondary | ICD-10-CM

## 2022-10-07 DIAGNOSIS — E119 Type 2 diabetes mellitus without complications: Secondary | ICD-10-CM | POA: Diagnosis not present

## 2022-10-08 ENCOUNTER — Telehealth: Payer: Self-pay

## 2022-10-08 NOTE — Telephone Encounter (Signed)
Patient called to say her bp has been low and also her amlodipine has been reduced to '5mg'$  due to the same reason. She hasn't been taking her metoprolol because she was on this previously and it lowered her BP also. She lives alone and is scared to have low BP and pass out

## 2022-10-08 NOTE — Telephone Encounter (Signed)
ok 

## 2022-10-26 ENCOUNTER — Ambulatory Visit (HOSPITAL_COMMUNITY)
Admission: EM | Admit: 2022-10-26 | Discharge: 2022-10-26 | Disposition: A | Discharge status: Home / Self Care | Payer: Medicare HMO | Attending: Physician Assistant | Admitting: Physician Assistant

## 2022-10-26 ENCOUNTER — Encounter (HOSPITAL_COMMUNITY): Payer: Self-pay

## 2022-10-26 ENCOUNTER — Ambulatory Visit (INDEPENDENT_AMBULATORY_CARE_PROVIDER_SITE_OTHER): Payer: Medicare HMO

## 2022-10-26 DIAGNOSIS — R059 Cough, unspecified: Secondary | ICD-10-CM

## 2022-10-26 DIAGNOSIS — J329 Chronic sinusitis, unspecified: Secondary | ICD-10-CM | POA: Diagnosis not present

## 2022-10-26 DIAGNOSIS — J40 Bronchitis, not specified as acute or chronic: Secondary | ICD-10-CM | POA: Diagnosis not present

## 2022-10-26 MED ORDER — AMOXICILLIN-POT CLAVULANATE 500-125 MG PO TABS: 1.0000 | ORAL_TABLET | Freq: Two times a day (BID) | ORAL | 0 refills | Status: DC

## 2022-10-26 NOTE — Discharge Instructions (Signed)
Your chest x-ray did not show any evidence of pneumonia which is great news.  Please focus on taking deep breaths.  Start Augmentin twice daily.  This will cover for any infection.  Use over-the-counter medication including Tylenol, Mucinex, Flonase for symptom relief.  Make sure that you are resting and drinking plenty of fluid.  Follow-up with your primary care first thing next week.  If anything worsens and you have shortness of breath, worsening cough, fever, nausea/vomiting interfere with oral intake, weakness you need to be seen immediately.

## 2022-10-26 NOTE — ED Provider Notes (Signed)
[facility]    CSN: [id number] Arrival date & time: 10/26/22  1015      History   Chief Complaint Chief Complaint  Patient presents with   Sore Throat   Cough   Dizziness    HPI [name] is a 71 y.o. female.   Patient presents today with a 5 to 6-day history of URI symptoms including productive cough, sore throat, nasal congestion, sneezing, dizziness.  Denies any chest pain, shortness of breath, fever, nausea, vomiting, diarrhea.  Denies any known sick contacts.  She has been using Mucinex and Flonase without improvement of symptoms.  She does have a history of asthma but has not required her albuterol inhaler since her symptoms began.  She denies any recent antibiotics or steroids.  She has had COVID in 2022.  She has not had COVID or influenza vaccines.  She does have a history of diabetes but reports her diabetes is well-controlled; last A1c was 6.3% on 01/09/2022.  She is a former smoker but quit many years ago.  She does have a complicated past medical history including cardiovascular disease but reports she is currently doing well.    Past Medical History:  Diagnosis Date   Anxiety    Arthritis    Asthma    Cardiac arrest ([place]) 10/28/2021   Depression    Diabetes mellitus without complication (HCC)    Fibromyalgia    GERD (gastroesophageal reflux disease)    Hyperlipemia    Hypertension    PONV (postoperative nausea and vomiting)     Patient Active Problem List   Diagnosis Date Noted   History of cardiac arrest 10/04/2022   Malnutrition of moderate degree 06/05/2022   Acute lower GI bleeding 06/02/2022   GI bleed 02/18/2022   Bacteremia due to Enterococcus 02/15/2022   Goals of care, counseling/discussion 02/15/2022   Pain due to onychomycosis of toenails of both feet 02/06/2022   Adult failure to thrive syndrome 01/22/2022   History of anemia due to chronic kidney disease 01/17/2022   Neurocognitive deficits 01/17/2022   Pressure injury of  skin 01/11/2022   History of pulmonary embolism 01/10/2022   Hypoglycemia 01/09/2022   CKD (chronic kidney disease) stage 4, GFR 15-29 ml/min ([place]) 01/09/2022   Syncope 01/09/2022   Essential hypertension    Debility 12/04/2021   Anoxia of brain ([place]) 12/04/2021   Acute on chronic renal failure ([place]) 11/12/2021   Leukocytosis    Thrombocytopenia ([place])    Aspiration pneumonia of both lower lobes ([place])    Hemodialysis-associated hypotension    Acute respiratory failure ([place]) due to recurrent aspiration Pneumonia and mucous plugging/atelectasis     Acute saddle pulmonary embolism with acute cor pulmonale ([place])    Protein-calorie malnutrition, severe 11/08/2021   Cardiac arrest ([place]) 10/28/2021   Adjustment disorder with depressed mood 04/10/2021   History of infectious disease 04/10/2021   Affective psychosis ([place]) 11/21/2020   Allergic rhinitis due to pollen 11/21/2020   Anxiety 11/21/2020   Chronic kidney disease, stage 2 (mild) 11/21/2020   Diabetic renal disease ([place]) 11/21/2020   Diverticular disease of colon 11/21/2020   Eczema 11/21/2020   Gastroesophageal reflux disease 11/21/2020   Gout 11/21/2020   Joint pain 11/21/2020   Lymphedema 11/21/2020   Mild intermittent asthma 11/21/2020   Moderate major depression, single episode ([place]) 11/21/2020   Neuropathy 11/21/2020   Type 2 diabetes mellitus without complication, with long-term current use of insulin ([place]) 11/21/2020   Pure hypercholesterolemia 11/21/2020   Vitamin  D deficiency 11/21/2020   Fibromyalgia 09/04/2017   Insomnia 09/04/2017   DDD (degenerative disc disease), cervical 09/04/2017   DDD (degenerative disc disease), lumbar 09/04/2017   Primary osteoarthritis of both hands 09/04/2017   Primary osteoarthritis of both knees 09/04/2017   History of rotator cuff tear 09/04/2017   Osteopenia of multiple sites 09/04/2017    Past Surgical History:  Procedure Laterality Date   COLONOSCOPY     COLONOSCOPY WITH  PROPOFOL N/A 02/20/2022   Procedure: COLONOSCOPY WITH PROPOFOL;  Surgeon: Otis Brace, MD;  Location: [place];  Service: Gastroenterology;  Laterality: N/A;   COLONOSCOPY WITH PROPOFOL N/A 06/06/2022   Procedure: COLONOSCOPY WITH PROPOFOL;  Surgeon: Arta Silence, MD;  Location: [place];  Service: Gastroenterology;  Laterality: N/A;   DILATION AND CURETTAGE OF UTERUS     ESOPHAGOGASTRODUODENOSCOPY N/A 02/20/2022   Procedure: ESOPHAGOGASTRODUODENOSCOPY (EGD);  Surgeon: Otis Brace, MD;  Location: [facility]COPY;  Service: Gastroenterology;  Laterality: N/A;   IR EMBO ART  VEN HEMORR LYMPH EXTRAV  INC GUIDE ROADMAPPING  11/01/2021   IR FLUORO GUIDE CV LINE RIGHT  11/20/2021   IR IVC FILTER PLMT / S&I /IMG GUID/MOD SED  06/08/2022   IR REMOVAL TUN CV CATH W/O FL  12/20/2021   IR THROMBECT PRIM MECH INIT (INCLU) MOD SED  10/29/2021   IR US GUIDE VASC ACCESS RIGHT  10/29/2021   IR US GUIDE VASC ACCESS RIGHT  11/01/2021   IR US GUIDE VASC ACCESS RIGHT  11/21/2021   KNEE ARTHROSCOPY  [id number]   left   POLYPECTOMY  02/20/2022   Procedure: POLYPECTOMY;  Surgeon: Otis Brace, MD;  Location: [place] ENDOSCOPY;  Service: Gastroenterology;;   SHOULDER ARTHROSCOPY     left   TRIGGER FINGER RELEASE  08/13/2012   Procedure: RELEASE TRIGGER FINGER/A-1 PULLEY;  Surgeon: [name]., MD;  Location: [place];  Service: Orthopedics;  Laterality: Right;  long finger   TUBAL LIGATION      OB History   No obstetric history on file.      Home Medications    Prior to Admission medications   Medication Sig Start Date End Date Taking? Authorizing Provider  acetaminophen (TYLENOL) 325 MG tablet Take 2 tablets (650 mg total) by mouth every 4 (four) hours as needed for fever (greater than 37.6 C). Patient taking differently: Take 650 mg by mouth daily as needed (pain). 01/07/22   Angiulli, Lavon Paganini, PA-C  albuterol (VENTOLIN HFA) 108 (90 Base) MCG/ACT inhaler Inhale 1-2 puffs  into the lungs every 4 (four) hours as needed for wheezing or shortness of breath. Patient taking differently: Inhale 2 puffs into the lungs daily as needed for wheezing or shortness of breath. 02/14/22   Medina-Vargas, Monina C, NP  ALPRAZolam (XANAX) 0.25 MG tablet Take 0.25 mg by mouth daily as needed. 09/13/22   [provider]  amLODipine (NORVASC) 10 MG tablet Take 5 mg by mouth daily. 03/06/22   [provider]  amoxicillin-clavulanate (AUGMENTIN) 500-125 MG tablet Take 1 tablet by mouth in the morning and at bedtime. 10/26/22  Yes Yoseph Haile K, PA-C  atorvastatin (LIPITOR) 20 MG tablet Take 1 tablet (20 mg total) by mouth daily. 02/14/22   Medina-Vargas, Monina C, NP  B Complex-C (B-COMPLEX WITH VITAMIN C) tablet Take 1 tablet by mouth daily. 01/08/22   Angiulli, Lavon Paganini, PA-C  dicyclomine (BENTYL) 10 MG capsule Take 1 capsule (10 mg total) by mouth 3 (three) times daily before meals. Patient taking differently:  Take 10 mg by mouth 2 (two) times daily as needed (stomach cramps). 02/14/22   Medina-Vargas, Monina C, NP  Ferrous Sulfate (IRON PO) Take 1 tablet by mouth daily.    [provider]  FLOVENT HFA 220 MCG/ACT inhaler Inhale 2 puffs into the lungs as needed (wheezing/SOB). 03/01/22   [provider]  folic acid (FOLVITE) 1 MG tablet Take 2 tablets (2 mg total) by mouth daily. 02/14/22   Medina-Vargas, Monina C, NP  Glucerna (GLUCERNA) LIQD Take 237 mLs by mouth 2 (two) times daily between meals.    [provider]  LANTUS SOLOSTAR 100 UNIT/ML Solostar Pen Inject 20 Units into the skin daily. 09/27/22   [provider]  lidocaine (LIDODERM) 5 % Place 2 patches onto the skin daily. Remove & Discard patch within 12 hours or as directed by MD Patient taking differently: Place 1 patch onto the skin daily as needed (pain). Remove & Discard patch within 12 hours or as directed by MD 02/14/22   Medina-Vargas, Monina C, NP  pantoprazole (PROTONIX)  40 MG tablet Take 1 tablet (40 mg total) by mouth 2 (two) times daily. 02/14/22   Medina-Vargas, Monina C, NP  temazepam (RESTORIL) 7.5 MG capsule Take 1 capsule (7.5 mg total) by mouth at bedtime as needed for sleep. 03/20/22   Volanda Napoleon, MD  Vitamin D, Ergocalciferol, (DRISDOL) 1.25 MG (50000 UNIT) CAPS capsule Take 1 capsule (50,000 Units total) by mouth once a week. 01/07/22   Angiulli, Lavon Paganini, PA-C    Family History Family History  Problem Relation Age of Onset   Diabetes Mother    Heart attack Father    Diabetes Sister    Throat cancer Brother    Hypertension Daughter     Social History Social History   Tobacco Use   Smoking status: Former    Packs/day: 0.50    Years: 20.00    Total pack years: 10.00    Types: Cigarettes    Quit date: 08/10/2010    Years since quitting: 12.2   Smokeless tobacco: Never  Vaping Use   Vaping Use: Never used  Substance Use Topics   Alcohol use: No   Drug use: No     Allergies   Darvon [propoxyphene], Hydrocodone bit-homatrop mbr, Metformin, Pentazocine, Percocet [oxycodone-acetaminophen], Sulfa antibiotics, Tetracaine hcl, Tetracyclines & related, Tramadol, Benadryl [diphenhydramine], Other, and Aspirin   Review of Systems Review of Systems  Constitutional:  Positive for activity change. Negative for appetite change, fatigue and fever.  HENT:  Positive for congestion, postnasal drip, sinus pressure and sore throat. Negative for sneezing.   Respiratory:  Positive for cough. Negative for shortness of breath.   Cardiovascular:  Negative for chest pain.  Gastrointestinal:  Negative for abdominal pain, diarrhea, nausea and vomiting.  Neurological:  Negative for dizziness, light-headedness and headaches.     Physical Exam Triage Vital Signs ED Triage Vitals [10/26/22 1228]  Enc Vitals Group     BP (!) 149/78     Pulse Rate 80     Resp 14     Temp 98.4 F (36.9 C)     Temp Source Oral     SpO2 96 %     Weight      Height       Head Circumference      Peak Flow      Pain Score 8     Pain Loc      Pain Edu?      Excl.  in [place]?    No data found.  Updated Vital Signs BP (!) 149/78 (BP Location: Right Leg)   Pulse 80   Temp 98.4 F (36.9 C) (Oral)   Resp 14   SpO2 96%   Visual Acuity Right Eye Distance:   Left Eye Distance:   Bilateral Distance:    Right Eye Near:   Left Eye Near:    Bilateral Near:     Physical Exam Vitals reviewed.  Constitutional:      General: She is awake. She is not in acute distress.    Appearance: Normal appearance. She is well-developed. She is not ill-appearing.     Comments: Very pleasant female appears stated age no acute distress sitting comfortably in exam room  HENT:     Head: Normocephalic and atraumatic.     Right Ear: Tympanic membrane, ear canal and external ear normal. Tympanic membrane is not erythematous or bulging.     Left Ear: Tympanic membrane, ear canal and external ear normal. Tympanic membrane is not erythematous or bulging.     Nose:     Right Sinus: No maxillary sinus tenderness or frontal sinus tenderness.     Left Sinus: No maxillary sinus tenderness or frontal sinus tenderness.     Mouth/Throat:     Pharynx: Uvula midline. Posterior oropharyngeal erythema present. No oropharyngeal exudate.  Cardiovascular:     Rate and Rhythm: Normal rate and regular rhythm.     Heart sounds: Normal heart sounds, S1 normal and S2 normal. No murmur heard. Pulmonary:     Effort: Pulmonary effort is normal.     Breath sounds: Examination of the right-lower field reveals decreased breath sounds. Examination of the left-lower field reveals decreased breath sounds. Decreased breath sounds present. No wheezing, rhonchi or rales.  Psychiatric:        Behavior: Behavior is cooperative.      UC Treatments / Results  Labs (all labs ordered are listed, but only abnormal results are displayed) Labs Reviewed - No data to display  EKG   Radiology DG Chest 2  View  Result Date: 10/26/2022 CLINICAL DATA:  Worsening cough. EXAM: CHEST - 2 VIEW COMPARISON:  AP chest 06/02/2022 FINDINGS: Cardiac silhouette and mediastinal contours are within normal limits. Mild calcification within aortic arch. Mildly decreased lung volumes. Curvilinear scarring overlies the left costophrenic angle, unchanged. No acute airspace opacity. No pleural effusion or pneumothorax. Partial visualization of an IVC filter overlying the right L1 and L2 vertebral bodies. Moderate multilevel degenerative disc changes of the thoracic spine. IMPRESSION: Mildly decreased lung volumes. No acute lung process. Electronically Signed   By: Yvonne Kendall M.D.   On: 10/26/2022 12:56    Procedures Procedures (including critical care time)  Medications Ordered in UC Medications - No data to display  Initial Impression / Assessment and Plan / UC Course  I have reviewed the triage vital signs and the nursing notes.  Pertinent labs & imaging results that were available during my care of the patient were reviewed by me and considered in my medical decision making (see chart for details).     Patient is well-appearing, afebrile, nontoxic, nontachycardic, with oxygen saturation of 96%.  Chest x-ray was obtained that showed no acute cardiopulmonary disease.  Viral testing was deferred as she has been symptomatic for 5+ days and this would not change our management and she is outside of the window of effectiveness for antivirals.  Given her worsening productive cough will cover for secondary bacterial infection.  Will start Augmentin twice daily for 10 days.  Patient had metabolic panel on 1/75/1025 with creatinine of 1.74 and calculated creatinine clearance of 30.31 mL/min; Augmentin was adjusted to 500/125 twice daily based on this information.  She was encouraged to use over-the-counter medication including Mucinex, Flonase, Tylenol.  Recommended that she follow-up with her primary care first thing next  week.  Discussed that if she has any worsening symptoms including increasing cough, shortness of breath, fever, nausea, vomiting she needs to be seen immediately.  Strict return precautions given.  Final Clinical Impressions(s) / UC Diagnoses   Final diagnoses:  Sinobronchitis     Discharge Instructions      Your chest x-ray did not show any evidence of pneumonia which is great news.  Please focus on taking deep breaths.  Start Augmentin twice daily.  This will cover for any infection.  Use over-the-counter medication including Tylenol, Mucinex, Flonase for symptom relief.  Make sure that you are resting and drinking plenty of fluid.  Follow-up with your primary care first thing next week.  If anything worsens and you have shortness of breath, worsening cough, fever, nausea/vomiting interfere with oral intake, weakness you need to be seen immediately.     ED Prescriptions     Medication Sig Dispense Auth. Provider   amoxicillin-clavulanate (AUGMENTIN) 500-125 MG tablet Take 1 tablet by mouth in the morning and at bedtime. 20 tablet Tasheem Elms, Derry Skill, PA-C      PDMP not reviewed this encounter.   Terrilee Croak, PA-C 10/26/22 1310

## 2022-10-26 NOTE — ED Triage Notes (Signed)
Patient states that she has a non productive couch, sore throat and dizzy since Wednesday

## 2022-11-14 ENCOUNTER — Ambulatory Visit: Payer: Medicare HMO | Admitting: Internal Medicine

## 2022-11-15 ENCOUNTER — Ambulatory Visit: Payer: Medicare HMO | Admitting: Internal Medicine

## 2022-11-15 VITALS — BP 163/84 | HR 83 | Ht 65.0 in | Wt 155.8 lb

## 2022-11-15 DIAGNOSIS — Z8674 Personal history of sudden cardiac arrest: Secondary | ICD-10-CM

## 2022-11-15 DIAGNOSIS — I10 Essential (primary) hypertension: Secondary | ICD-10-CM

## 2022-11-15 DIAGNOSIS — E119 Type 2 diabetes mellitus without complications: Secondary | ICD-10-CM

## 2022-11-15 DIAGNOSIS — Z794 Long term (current) use of insulin: Secondary | ICD-10-CM | POA: Diagnosis not present

## 2022-11-15 NOTE — Progress Notes (Signed)
Primary Physician/Referring:  [name], MD  Patient ID: [name], female    DOB: Feb 01, 1951, 72 y.o.   MRN: [id number]  Chief Complaint  Patient presents with   History of cardiac arrest   Results   Follow-up   HPI:    [name]  is a 72 y.o. female with past medical history significant for hypertension, diabetes, and cardiac arrest who is here for a follow-up visit. She has been doing well since the last time she was here. Her blood pressure is pretty high today but she thinks its because she was rushing and there was a lot of traffic getting here. Patient does check her BP at home and she will keep a log for me. Otherwise patient denies chest pain, shortness of breath, palpitations, diaphoresis, syncope, orthopnea, edema, PND, claudication.  Past Medical History:  Diagnosis Date   Anxiety    Arthritis    Asthma    Cardiac arrest ([place]) 10/28/2021   Depression    Diabetes mellitus without complication (HCC)    Fibromyalgia    GERD (gastroesophageal reflux disease)    Hyperlipemia    Hypertension    PONV (postoperative nausea and vomiting)    Past Surgical History:  Procedure Laterality Date   COLONOSCOPY     COLONOSCOPY WITH PROPOFOL N/A 02/20/2022   Procedure: COLONOSCOPY WITH PROPOFOL;  Surgeon: Otis Brace, MD;  Location: [place];  Service: Gastroenterology;  Laterality: N/A;   COLONOSCOPY WITH PROPOFOL N/A 06/06/2022   Procedure: COLONOSCOPY WITH PROPOFOL;  Surgeon: Arta Silence, MD;  Location: [place];  Service: Gastroenterology;  Laterality: N/A;   DILATION AND CURETTAGE OF UTERUS     ESOPHAGOGASTRODUODENOSCOPY N/A 02/20/2022   Procedure: ESOPHAGOGASTRODUODENOSCOPY (EGD);  Surgeon: Otis Brace, MD;  Location: [facility];  Service: Gastroenterology;  Laterality: N/A;   IR EMBO ART  VEN HEMORR LYMPH EXTRAV  INC GUIDE ROADMAPPING  11/01/2021   IR FLUORO GUIDE CV LINE RIGHT  11/20/2021   IR IVC FILTER PLMT / S&I /IMG GUID/MOD SED   06/08/2022   IR REMOVAL TUN CV CATH W/O FL  12/20/2021   IR THROMBECT PRIM MECH INIT (INCLU) MOD SED  10/29/2021   IR US GUIDE VASC ACCESS RIGHT  10/29/2021   IR US GUIDE VASC ACCESS RIGHT  11/01/2021   IR US GUIDE VASC ACCESS RIGHT  11/21/2021   KNEE ARTHROSCOPY  [id number]   left   POLYPECTOMY  02/20/2022   Procedure: POLYPECTOMY;  Surgeon: Otis Brace, MD;  Location: [facility];  Service: Gastroenterology;;   SHOULDER ARTHROSCOPY     left   TRIGGER FINGER RELEASE  08/13/2012   Procedure: RELEASE TRIGGER FINGER/A-1 PULLEY;  Surgeon: [name]., MD;  Location: [place];  Service: Orthopedics;  Laterality: Right;  long finger   TUBAL LIGATION     Family History  Problem Relation Age of Onset   Diabetes Mother    Heart attack Father    Diabetes Sister    Throat cancer Brother    Hypertension Daughter     Social History   Tobacco Use   Smoking status: Former    Packs/day: 0.50    Years: 20.00    Total pack years: 10.00    Types: Cigarettes    Quit date: 08/10/2010    Years since quitting: 12.2   Smokeless tobacco: Never  Substance Use Topics   Alcohol use: No   Marital Status: Divorced  ROS  Review of Systems  Cardiovascular:  Negative  for chest pain, claudication, dyspnea on exertion, leg swelling, palpitations and syncope.   Objective  Blood pressure (!) 163/84, pulse 83, height '5\' 5"'$  (1.651 m), weight 155 lb 12.8 oz (70.7 kg), SpO2 98 %. Body mass index is 25.93 kg/m.     11/15/2022   11:39 AM 10/26/2022   12:28 PM 10/04/2022   12:41 PM  Vitals with BMI  Height '5\' 5"'$   '5\' 5"'$   Weight 155 lbs 13 oz  147 lbs 3 oz  BMI 41.93  79.0  Systolic 240 973 532  Diastolic 84 78 70  Pulse 83 80 82     Physical Exam Vitals reviewed.  HENT:     Head: Normocephalic and atraumatic.  Cardiovascular:     Rate and Rhythm: Normal rate and regular rhythm.     Pulses: Normal pulses.     Heart sounds: Normal heart sounds. No murmur heard. Pulmonary:      Effort: Pulmonary effort is normal.     Breath sounds: Normal breath sounds.  Abdominal:     General: Bowel sounds are normal.  Musculoskeletal:     Right lower leg: No edema.     Left lower leg: No edema.  Skin:    General: Skin is warm and dry.  Neurological:     Mental Status: She is alert.     Medications and allergies   Allergies  Allergen Reactions   Darvon [Propoxyphene] Itching   Hydrocodone Bit-Homatrop Mbr Itching   Metformin Nausea And Vomiting   Pentazocine Other (See Comments)      jitters   Percocet [Oxycodone-Acetaminophen] Itching   Sulfa Antibiotics Hives   Tetracaine Hcl Itching   Tetracyclines & Related Hives   Tramadol Itching   Benadryl [Diphenhydramine] Itching and Other (See Comments)    Can only take dye free. States the dye causes itching.   Other     Other reaction(s): Unknown. States reaction was to nasal spray that caused pupils to shrink   Aspirin Nausea And Vomiting     Medication list after today's encounter   Current Outpatient Medications:    acetaminophen (TYLENOL) 325 MG tablet, Take 2 tablets (650 mg total) by mouth every 4 (four) hours as needed for fever (greater than 37.6 C). (Patient taking differently: Take 650 mg by mouth daily as needed (pain).), Disp: , Rfl:    albuterol (VENTOLIN HFA) 108 (90 Base) MCG/ACT inhaler, Inhale 1-2 puffs into the lungs every 4 (four) hours as needed for wheezing or shortness of breath. (Patient taking differently: Inhale 2 puffs into the lungs daily as needed for wheezing or shortness of breath.), Disp: 8.5 g, Rfl: 0   ALPRAZolam (XANAX) 0.25 MG tablet, Take 0.25 mg by mouth daily as needed., Disp: , Rfl:    amLODipine (NORVASC) 10 MG tablet, Take 5 mg by mouth daily., Disp: , Rfl:    atorvastatin (LIPITOR) 20 MG tablet, Take 1 tablet (20 mg total) by mouth daily., Disp: 30 tablet, Rfl: 0   B Complex-C (B-COMPLEX WITH VITAMIN C) tablet, Take 1 tablet by mouth daily., Disp: 30 tablet, Rfl: 0    dicyclomine (BENTYL) 10 MG capsule, Take 1 capsule (10 mg total) by mouth 3 (three) times daily before meals. (Patient taking differently: Take 10 mg by mouth 2 (two) times daily as needed (stomach cramps).), Disp: 90 capsule, Rfl: 0   FARXIGA 10 MG TABS tablet, Take 10 mg by mouth daily., Disp: , Rfl:    Ferrous Sulfate (IRON PO), Take 1 tablet by  mouth daily., Disp: , Rfl:    Glucerna (GLUCERNA) LIQD, Take 237 mLs by mouth 2 (two) times daily between meals., Disp: , Rfl:    LANTUS SOLOSTAR 100 UNIT/ML Solostar Pen, Inject 20 Units into the skin daily., Disp: , Rfl:    lidocaine (LIDODERM) 5 %, Place 2 patches onto the skin daily. Remove & Discard patch within 12 hours or as directed by MD (Patient taking differently: Place 1 patch onto the skin daily as needed (pain). Remove & Discard patch within 12 hours or as directed by MD), Disp: 60 patch, Rfl: 0   pantoprazole (PROTONIX) 40 MG tablet, Take 1 tablet (40 mg total) by mouth 2 (two) times daily., Disp: 60 tablet, Rfl: 0   temazepam (RESTORIL) 7.5 MG capsule, Take 1 capsule (7.5 mg total) by mouth at bedtime as needed for sleep., Disp: 30 capsule, Rfl: 0   Vitamin D, Ergocalciferol, (DRISDOL) 1.25 MG (50000 UNIT) CAPS capsule, Take 1 capsule (50,000 Units total) by mouth once a week., Disp: 5 capsule, Rfl: 0   folic acid (FOLVITE) 1 MG tablet, Take 2 tablets (2 mg total) by mouth daily. (Patient not taking: Reported on 11/15/2022), Disp: 60 tablet, Rfl: 0  Laboratory examination:   Lab Results  Component Value Date   NA 140 07/14/2022   K 3.8 07/14/2022   CO2 19 (L) 07/14/2022   GLUCOSE 166 (H) 07/14/2022   BUN 28 (H) 07/14/2022   CREATININE 1.74 (H) 07/14/2022   CALCIUM 9.4 07/14/2022   GFRNONAA 31 (L) 07/14/2022       Latest Ref Rng & Units 07/14/2022   12:55 PM 06/09/2022    1:44 AM 06/08/2022    2:18 AM  CMP  Glucose 70 - 99 mg/dL 166  202  164   BUN 8 - 23 mg/dL '28  13  12   '$ Creatinine 0.44 - 1.00 mg/dL 1.74  1.69  1.76   Sodium  135 - 145 mmol/L 140  142  142   Potassium 3.5 - 5.1 mmol/L 3.8  3.5  3.4   Chloride 98 - 111 mmol/L 111  107  111   CO2 22 - 32 mmol/L '19  28  27   '$ Calcium 8.9 - 10.3 mg/dL 9.4  7.9  7.7   Total Protein 6.5 - 8.1 g/dL 7.1     Total Bilirubin 0.3 - 1.2 mg/dL 0.3     Alkaline Phos 38 - 126 U/L 84     AST 15 - 41 U/L 16     ALT 0 - 44 U/L 12         Latest Ref Rng & Units 07/14/2022   12:55 PM 06/08/2022   10:48 AM 06/06/2022    4:43 PM  CBC  WBC 4.0 - 10.5 K/uL 8.2  8.2  10.6   Hemoglobin 12.0 - 15.0 g/dL 11.3  10.6  11.0   Hematocrit 36.0 - 46.0 % 34.9  31.0  32.4   Platelets 150 - 400 K/uL 301  173  160     Lipid Panel No results for input(s): "CHOL", "TRIG", "LDLCALC", "VLDL", "HDL", "CHOLHDL", "LDLDIRECT" in the last 8760 hours.   HEMOGLOBIN A1C Lab Results  Component Value Date   HGBA1C 6.3 (H) 01/09/2022   MPG 134 01/09/2022   TSH Recent Labs    01/23/22 0000  TSH 1.26    External labs:     Radiology:    Cardiac Studies:   10/28/2021 ECHO IMPRESSIONS   1. Right ventricular function is  abnormal. In the setting of cardiac  arrest, would rule out pulmonary embolism if clinically indicated.   2. Left ventricular ejection fraction, by estimation, is 70 to 75%. The  left ventricle has hyperdynamic function. The left ventricle has no  regional wall motion abnormalities. There is moderate concentric left  ventricular hypertrophy. Left ventricular  diastolic parameters are indeterminate.   3. Hypokinesis of the mid-apical right ventricle with normal systolic  function at the base. This pattern is opposite of McConnell's sign, which  can be seen in acute pulmonary embolism. Right ventricular systolic  function is moderately reduced. The right   ventricular size is normal. There is severely elevated pulmonary artery  systolic pressure.   4. Systolic anterior motion (SAM) of the mitral valve. The mitral valve  is normal in structure. No evidence of mitral valve  regurgitation. No  evidence of mitral stenosis.   5. Tricuspid valve regurgitation is moderate to severe.   6. The aortic valve is tricuspid. Aortic valve regurgitation is not  visualized. No aortic stenosis is present.   7. The inferior vena cava is normal in size with <50% respiratory  variability, suggesting right atrial pressure of 8 mmHg.     12/18/2021 ECHO IMPRESSIONS   1. Left ventricular ejection fraction, by estimation, is 60 to 65%. The  left ventricle has normal function. The left ventricle has no regional  wall motion abnormalities. There is mild concentric left ventricular  hypertrophy. Left ventricular diastolic  parameters are consistent with Grade I diastolic dysfunction (impaired  relaxation).   2. Right ventricular systolic function is normal. The right ventricular  size is normal.   3. The mitral valve is normal in structure. No evidence of mitral valve  regurgitation. No evidence of mitral stenosis.   4. The aortic valve is normal in structure. Aortic valve regurgitation is  not visualized. No aortic stenosis is present.   5. The inferior vena cava is normal in size with greater than 50%  respiratory variability, suggesting right atrial pressure of 3 mmHg.     01/10/2022 ECHO IMPRESSIONS   1. Left ventricular ejection fraction, by estimation, is 60 to 65%. The  left ventricle has normal function. The left ventricle has no regional  wall motion abnormalities. Left ventricular diastolic parameters are  consistent with Grade I diastolic  dysfunction (impaired relaxation).   2. Right ventricular systolic function is normal. The right ventricular  size is normal.   3. The mitral valve is normal in structure. No evidence of mitral valve  regurgitation. No evidence of mitral stenosis.   4. The aortic valve is tricuspid. Aortic valve regurgitation is not  visualized. No aortic stenosis is present.    Modified Bruce exercise Tetrofosmin stress test 10/07/2022: No  previous exam available for comparison. Modified Bruce nuclear stress test performed using 1-day protocol. Patient achieved 2.9 METS and reached 88% MPHR. No chest pain reported. Heart rate and hemodynamic response were normal. Stress EKG revealed no ischemic changes. Normal myocardial perfusion. Stress LVEF 71%. Low risk study.  EKG:   10/04/2022: Sinus Rhythm  Low voltage in limb leads. -Left atrial enlargement.  Assessment     ICD-10-CM   1. History of cardiac arrest  Z86.74     2. Essential hypertension  I10     3. Type 2 diabetes mellitus without complication, with long-term current use of insulin (HCC)  E11.9    Z79.4        No orders of the defined types were placed in  this encounter.   No orders of the defined types were placed in this encounter.   Medications Discontinued During This Encounter  Medication Reason   amoxicillin-clavulanate (AUGMENTIN) 500-125 MG tablet Completed Course   FLOVENT HFA 220 MCG/ACT inhaler Completed Course     Recommendations:   [name] is a 72 y.o.  female with history of cardiac arrest 10/28/2021   History of cardiac arrest Total downtime 30 minutes Cause of arrest was massive pulmonary embolus causing cor pulmonale Patient had extensive hospital course, stayed for 2 months She was on hemodialysis for the majority of that time Patient was also on Eliquis for while after but she had bloody stools and therefore it was stopped Patient has recovered very nicely Nuclear stress test negative for ischemia and TTEs show recovered LVEF   Essential hypertension Continue current medications, last visit I added low-dose beta-blocker - patient is tolerating without issues BP elevated today due to stress and rushing, previous visit BP was very well controlled She does not want to pay a co-pay to be in our RPM program. Patient will keep BP log and send to me in 4-6 weeks. Encourage low-sodium diet, less than 2000 mg daily. Follow-up  in 3 months or sooner if needed.   Type 2 diabetes mellitus without complication, with long-term current use of insulin [facility]) Continue current regimen, PCP following     Floydene Flock, DO, [facility]  11/15/2022, 12:43 PM Office: [phone] Pager: [phone]

## 2022-11-20 ENCOUNTER — Telehealth: Payer: Self-pay

## 2022-11-20 DIAGNOSIS — I129 Hypertensive chronic kidney disease with stage 1 through stage 4 chronic kidney disease, or unspecified chronic kidney disease: Secondary | ICD-10-CM | POA: Diagnosis not present

## 2022-11-20 DIAGNOSIS — K219 Gastro-esophageal reflux disease without esophagitis: Secondary | ICD-10-CM | POA: Diagnosis not present

## 2022-11-20 DIAGNOSIS — G4700 Insomnia, unspecified: Secondary | ICD-10-CM | POA: Diagnosis not present

## 2022-11-20 DIAGNOSIS — I5030 Unspecified diastolic (congestive) heart failure: Secondary | ICD-10-CM | POA: Diagnosis not present

## 2022-11-20 DIAGNOSIS — E7800 Pure hypercholesterolemia, unspecified: Secondary | ICD-10-CM | POA: Diagnosis not present

## 2022-11-20 DIAGNOSIS — N182 Chronic kidney disease, stage 2 (mild): Secondary | ICD-10-CM | POA: Diagnosis not present

## 2022-11-20 DIAGNOSIS — J4520 Mild intermittent asthma, uncomplicated: Secondary | ICD-10-CM | POA: Diagnosis not present

## 2022-11-20 DIAGNOSIS — E1121 Type 2 diabetes mellitus with diabetic nephropathy: Secondary | ICD-10-CM | POA: Diagnosis not present

## 2022-11-20 NOTE — Telephone Encounter (Signed)
PCP saw patient yesterday. Patient is not taking ToprolXL. Patient was informed that she needs to be on this medication. Patient says she does not have any medication a 90 day supply was dispersed 10/04/22. Insurance will not pay for any more refills until 12/09/22. LMOVM.

## 2022-11-21 ENCOUNTER — Telehealth: Payer: Self-pay | Admitting: *Deleted

## 2022-11-21 ENCOUNTER — Telehealth: Payer: Self-pay

## 2022-11-21 DIAGNOSIS — E119 Type 2 diabetes mellitus without complications: Secondary | ICD-10-CM

## 2022-11-21 DIAGNOSIS — I10 Essential (primary) hypertension: Secondary | ICD-10-CM

## 2022-11-21 NOTE — Addendum Note (Signed)
Addended by: Arville Care on: 11/21/2022 03:36 PM   Modules accepted: Orders

## 2022-11-21 NOTE — Progress Notes (Signed)
  Care Coordination  Outreach Note  11/21/2022 Name: [name] MRN: [id number] DOB: [birth year]   Care Coordination Outreach Attempts: An unsuccessful telephone outreach was attempted today to offer the patient information about available care coordination services as a benefit of their health plan.   Follow Up Plan:  Additional outreach attempts will be made to offer the patient care coordination information and services.   Encounter Outcome:  No Answer  [place]  Direct Dial: [phone]

## 2022-11-25 ENCOUNTER — Ambulatory Visit: Payer: Medicare PPO | Admitting: Neurology

## 2022-11-25 NOTE — Progress Notes (Signed)
  Care Coordination   Note   11/25/2022 Name: MONEISHA VOSLER MRN: [id number] DOB: 11-Sep-1951  LYLIAN SANAGUSTIN is a 72 y.o. year old female who sees [name], MD for primary care. I reached out to [name] by phone today to offer care coordination services.  Ms. Lobo was given information about Care Coordination services today including:   The Care Coordination services include support from the care team which includes your Nurse Coordinator, Clinical Social Worker, or Pharmacist.  The Care Coordination team is here to help remove barriers to the health concerns and goals most important to you. Care Coordination services are voluntary, and the patient may decline or stop services at any time by request to their care team member.   Care Coordination Consent Status: Patient agreed to services and verbal consent obtained.   Follow up plan:  Telephone appointment with care coordination team member scheduled for:  12/04/22  Encounter Outcome:  Pt. Scheduled  [place]  Direct Dial: [phone]

## 2022-12-04 ENCOUNTER — Ambulatory Visit: Payer: Self-pay

## 2022-12-04 DIAGNOSIS — E119 Type 2 diabetes mellitus without complications: Secondary | ICD-10-CM

## 2022-12-04 DIAGNOSIS — I10 Essential (primary) hypertension: Secondary | ICD-10-CM

## 2022-12-04 NOTE — Patient Outreach (Signed)
  Care Coordination   Initial Visit Note   12/04/2022 Name: [name] MRN: [id number] DOB: 26-Jul-1951  [name] is a 72 y.o. year old female who sees Wenda Low, MD for primary care. I spoke with  [name] by phone today.  What matters to the patients health and wellness today?  I have been doing good.  I would like for my diabetes to be controlled better. States her CBG are usually around 130 in the morning and 140 at bedtime.  States they have been better since she started insulin and [place].  Denies any recent low readings.      Goals Addressed             This Visit's Progress    Care Coordination Activities-diabetes self management       Care Coordination Interventions: Provided education to patient about basic DM disease process Reviewed medications with patient and discussed importance of medication adherence Counseled on importance of regular laboratory monitoring as prescribed Discussed plans with patient for ongoing care management follow up and provided patient with direct contact information for care management team Reviewed scheduled/upcoming provider appointments including: Dr Lysle Rubens 12/26/22 Advised patient, providing education and rationale, to check cbg twice a day and record, calling provider for findings outside established parameters Referral made to community resources care guide team for assistance with food resources.food banks pt would like information on food banks Assessed social determinant of health barriers          SDOH assessments and interventions completed:  Yes  SDOH Interventions Today    Flowsheet Row Most Recent Value  SDOH Interventions   Food Insecurity Interventions Other (Comment)  [referral to care guide for food resources]  Housing Interventions Intervention Not Indicated  Transportation Interventions Intervention Not Indicated  Utilities Interventions Intervention Not Indicated        Care Coordination  Interventions:  Yes, provided  Interventions Today    Flowsheet Row Most Recent Value  Chronic Disease Discussed/Reviewed   Chronic disease discussed/reviewed during today's visit Diabetes  General Interventions   General Interventions Discussed/Reviewed General Interventions Discussed, Labs  Labs Hgb A1c every 3 months  Education Interventions   Education Provided Provided Verbal Education, Provided Printed Education  Provided Verbal Education On Nutrition, Blood Sugar Monitoring  Nutrition Interventions   Nutrition Discussed/Reviewed Nutrition Discussed, Decreasing sugar intake       Follow up plan: Follow up call scheduled for 01/01/23    Encounter Outcome:  Pt. Visit Completed  Peter Garter RN, [facility], [place] Management [phone]

## 2022-12-04 NOTE — Patient Instructions (Signed)
Visit Information  Thank you for taking time to visit with me today. Please don't hesitate to contact me if I can be of assistance to you.   Following are the goals we discussed today:   Goals Addressed             This Visit's Progress    Care Coordination Activities-diabetes self management       Care Coordination Interventions: Provided education to patient about basic DM disease process Reviewed medications with patient and discussed importance of medication adherence Counseled on importance of regular laboratory monitoring as prescribed Discussed plans with patient for ongoing care management follow up and provided patient with direct contact information for care management team Reviewed scheduled/upcoming provider appointments including: Dr Lysle Rubens 12/26/22 Advised patient, providing education and rationale, to check cbg twice a day and record, calling provider for findings outside established parameters Referral made to community resources care guide team for assistance with food resources.food banks pt would like information on food banks Assessed social determinant of health barriers          Our next appointment is by telephone on 01/01/23 at 11 AM  Please call the care guide team at [phone] if you need to cancel or reschedule your appointment.   If you are experiencing a Mental Health or [place] or need someone to talk to, please call the Suicide and Crisis Lifeline: 988 call the [place] National Suicide Prevention Lifeline: 870-184-0910 or TTY: 7806131771 TTY 3123983432) to talk to a trained counselor call 1-800-273-TALK (toll free, 24 hour hotline) go to [facility] [address], [place] [phone]) call 911   Patient verbalizes understanding of instructions and care plan provided today and agrees to view in [place]. Active MyChart status and patient understanding of how to access instructions and  care plan via MyChart confirmed with patient.     Telephone follow up appointment with care management team member scheduled for:01/01/23  Peter Garter RN, [facility], [place] Management Coordinator [place] Management [phone]

## 2022-12-05 ENCOUNTER — Telehealth: Payer: Self-pay

## 2022-12-05 NOTE — Patient Instructions (Signed)
Visit Information  Thank you for taking time to visit with me today. Please don't hesitate to contact me if I can be of assistance to you.   Following are the goals we discussed today:   Goals Addressed             This Visit's Progress    Care Coordination Activities-diabetes self management       Care Coordination Interventions: Provided education to patient about basic DM disease process Reviewed medications with patient and discussed importance of medication adherence Counseled on importance of regular laboratory monitoring as prescribed Discussed plans with patient for ongoing care management follow up and provided patient with direct contact information for care management team Reviewed scheduled/upcoming provider appointments including: Dr Lysle Rubens 12/26/22 Advised patient, providing education and rationale, to check cbg twice a day and record, calling provider for findings outside established parameters Referral made to community resources care guide team for assistance with food resources.food banks pt would like information on food banks Assessed social determinant of health barriers Care Coordination Interventions: Social Work referral for level of care issues spoke with daughter Lolita Patella about pt needing more care at home            Your next appointment is by telephone on 12/09/22 at 10 AM  Please call the care guide team at [phone] if you need to cancel or reschedule your appointment.   If you are experiencing a Mental Health or [place] or need someone to talk to, please call the Suicide and Crisis Lifeline: 988 call the [place] National Suicide Prevention Lifeline: 402-106-1308 or TTY: 562-165-9129 TTY 808-145-0135) to talk to a trained counselor call 1-800-273-TALK (toll free, 24 hour hotline) go to [facility] [address] [place] [phone]) call 911   Patient verbalizes understanding of  instructions and care plan provided today and agrees to view in [place]. Active MyChart status and patient understanding of how to access instructions and care plan via MyChart confirmed with patient.     Telephone follow up appointment with care management team member scheduled for:12/09/22  Peter Garter RN, [facility], CDE Care Management Coordinator [place] Management [phone]

## 2022-12-05 NOTE — Patient Outreach (Signed)
  Care Coordination   Follow Up Visit Note   12/05/2022 Name: [name] MRN: [id number] DOB: 1951/05/10  [name] is a 72 y.o. year old female who sees Maria Low, MD for primary care. I spoke with  [name] by phone today. Pt gives consent to speak with daughter [name]   What matters to the patients health and wellness today?  Daughter is concerned that pt needs lives alone and needs more help. States she would like to find out what is available to pt and is not sure how long pt can live on her own    Goals Addressed             This Visit's Progress    Care Coordination Activities-diabetes self management       Care Coordination Interventions: Provided education to patient about basic DM disease process Reviewed medications with patient and discussed importance of medication adherence Counseled on importance of regular laboratory monitoring as prescribed Discussed plans with patient for ongoing care management follow up and provided patient with direct contact information for care management team Reviewed scheduled/upcoming provider appointments including: Dr [name] 12/26/22 Advised patient, providing education and rationale, to check cbg twice a day and record, calling provider for findings outside established parameters Referral made to community resources care guide team for assistance with food resources.food banks pt would like information on food banks Assessed social determinant of health barriers Care Coordination Interventions: Social Work referral for level of care issues spoke with daughter [name] about pt needing more care at home            SDOH assessments and interventions completed:  No     Care Coordination Interventions:  Yes, provided   Follow up plan: Referral made to LCSW to discuss level of care issues    Encounter Outcome:  Pt. Visit Completed  Maria Garter RN, BSN,CCM, [place] Management Coordinator [place] Management [phone]

## 2022-12-06 ENCOUNTER — Telehealth: Payer: Self-pay

## 2022-12-06 NOTE — Telephone Encounter (Signed)
   Telephone encounter was:  Unsuccessful.  12/06/2022 Name: [name] MRN: [id number] DOB: 05/28/1951  Unsuccessful outbound call made today to assist with:  Food Insecurity  Outreach Attempt:  1st Attempt  A HIPAA compliant voice message was left requesting a return call.  Instructed patient to call back at [phone].  [place] Resource Care Guide   ??[email or web address]  ?? [phone]   Website: [email or web address]  [email or web address]

## 2022-12-09 ENCOUNTER — Telehealth: Payer: Self-pay

## 2022-12-09 ENCOUNTER — Ambulatory Visit: Payer: Self-pay | Admitting: Licensed Clinical Social Worker

## 2022-12-09 DIAGNOSIS — K219 Gastro-esophageal reflux disease without esophagitis: Secondary | ICD-10-CM | POA: Diagnosis not present

## 2022-12-09 DIAGNOSIS — N182 Chronic kidney disease, stage 2 (mild): Secondary | ICD-10-CM | POA: Diagnosis not present

## 2022-12-09 DIAGNOSIS — J4520 Mild intermittent asthma, uncomplicated: Secondary | ICD-10-CM | POA: Diagnosis not present

## 2022-12-09 DIAGNOSIS — E7800 Pure hypercholesterolemia, unspecified: Secondary | ICD-10-CM | POA: Diagnosis not present

## 2022-12-09 DIAGNOSIS — G4700 Insomnia, unspecified: Secondary | ICD-10-CM | POA: Diagnosis not present

## 2022-12-09 DIAGNOSIS — E1121 Type 2 diabetes mellitus with diabetic nephropathy: Secondary | ICD-10-CM | POA: Diagnosis not present

## 2022-12-09 DIAGNOSIS — I5030 Unspecified diastolic (congestive) heart failure: Secondary | ICD-10-CM | POA: Diagnosis not present

## 2022-12-09 DIAGNOSIS — I129 Hypertensive chronic kidney disease with stage 1 through stage 4 chronic kidney disease, or unspecified chronic kidney disease: Secondary | ICD-10-CM | POA: Diagnosis not present

## 2022-12-09 NOTE — Patient Instructions (Signed)
Visit Information  Thank you for taking time to visit with me today. Please don't hesitate to contact me if I can be of assistance to you.   Following are the goals we discussed today:   Goals Addressed             This Visit's Progress    Connect with community resources for In-home care       Activities in order to accomplish goals.  Daughter will assist  patient with the activies Call [place] [phone]  with Medicaid questions Call to inquire about the PACE program [phone]  (review brochure provided) Review CAPS brochure Contact insurance about long-term care Review brochure on Special Assistance   Review booklet ''When a loved one need long-term care" ( this was e-mailed) Review private pay home care options provided and discussed          Please call the care guide team at [phone] if you need to cancel or reschedule your appointment.    Patient verbalizes understanding of instructions and care plan provided today and agrees to view in [place]. Active MyChart status and patient understanding of how to access instructions and care plan via MyChart confirmed with patient.     No further follow up required: by LCSW at this time  Casimer Lanius, [place] [phone]

## 2022-12-09 NOTE — Telephone Encounter (Signed)
   Telephone encounter was:  Successful.  12/09/2022 Name: SHANNON BALTHAZAR MRN: [id number] DOB: 1951-06-10  [name] is a 72 y.o. year old female who is a primary care patient of Wenda Low, MD . The community resource team was consulted for assistance with [place] guide performed the following interventions: Spoke with patient's daughter Lolita Patella verified email address [email or web address] to send [facility] [employer].  Also verified patient's home address to mail food pantry list.  Follow Up Plan:  No further follow up planned at this time. The patient has been provided with needed resources. and patient's daughter has my contact information.  [place] Resource Care Guide   ??[email or web address]  ?? [phone]   Website: [email or web address]  [email or web address]

## 2022-12-09 NOTE — Telephone Encounter (Signed)
   Telephone encounter was:  Unsuccessful.  12/09/2022 Name: [name] MRN: [id number] DOB: 06-24-1951  Unsuccessful outbound call made today to assist with:  Food Insecurity  Outreach Attempt:  2nd Attempt  A HIPAA compliant voice message was left requesting a return call.  Instructed patient to call back at [phone].  [place] Resource Care Guide   ??[email or web address]  ?? [phone]   Website: [email or web address]  [email or web address]

## 2022-12-09 NOTE — Patient Outreach (Signed)
  Care Coordination  Initial Visit Note   12/09/2022 Name: [name] MRN: [id number] DOB: September 25, 1951  [name] is a 72 y.o. year old female who sees Wenda Low, MD for primary care. I spoke with  [name] 's daughter by phone today.  What matters to the patients health and wellness today?    Patient's daughter's [name]  provided all information during this encounter. Patient needs additional home support with managing ADL's Recommendation: Patient may benefit from, and daughter is in agreement to explore options discussed today.    Goals Addressed             This Visit's Progress    Connect with community resources for In-home care       Activities in order to accomplish goals.  Daughter will assist  patient with the activies Call [place] [phone]  with Medicaid questions Call to inquire about the PACE program [phone]  (review brochure provided) Review CAPS brochure Contact insurance about long-term care Review brochure on Special Assistance   Review booklet ''When a loved one need long-term care" ( this was e-mailed) Review private pay home care options provided and discussed         SDOH assessments and interventions completed:  No   Care Coordination Interventions:  Yes, provided  Interventions Today    Flowsheet Row Most Recent Value  General Interventions   General Interventions Discussed/Reviewed General Interventions Discussed, Community Resources  Education Interventions   Education Provided Provided Web-based Education, Provided Verbal Education  Provided Verbal Education On --  [employer] of care options]       Follow up plan:  No further intervention required by LCSW. Daughter will call as needed Follow up call scheduled for March with RN  Encounter Outcome:  Pt. Visit Completed   Casimer Lanius, [place] [phone]

## 2022-12-19 DIAGNOSIS — H268 Other specified cataract: Secondary | ICD-10-CM | POA: Diagnosis not present

## 2022-12-19 DIAGNOSIS — H269 Unspecified cataract: Secondary | ICD-10-CM | POA: Diagnosis not present

## 2022-12-19 DIAGNOSIS — H25012 Cortical age-related cataract, left eye: Secondary | ICD-10-CM | POA: Diagnosis not present

## 2022-12-19 DIAGNOSIS — H2512 Age-related nuclear cataract, left eye: Secondary | ICD-10-CM | POA: Diagnosis not present

## 2022-12-26 DIAGNOSIS — J4520 Mild intermittent asthma, uncomplicated: Secondary | ICD-10-CM | POA: Diagnosis not present

## 2022-12-26 DIAGNOSIS — I700 Atherosclerosis of aorta: Secondary | ICD-10-CM | POA: Diagnosis not present

## 2022-12-26 DIAGNOSIS — I5030 Unspecified diastolic (congestive) heart failure: Secondary | ICD-10-CM | POA: Diagnosis not present

## 2022-12-26 DIAGNOSIS — N184 Chronic kidney disease, stage 4 (severe): Secondary | ICD-10-CM | POA: Diagnosis not present

## 2022-12-26 DIAGNOSIS — E1165 Type 2 diabetes mellitus with hyperglycemia: Secondary | ICD-10-CM | POA: Diagnosis not present

## 2022-12-26 DIAGNOSIS — E559 Vitamin D deficiency, unspecified: Secondary | ICD-10-CM | POA: Diagnosis not present

## 2022-12-26 DIAGNOSIS — E1122 Type 2 diabetes mellitus with diabetic chronic kidney disease: Secondary | ICD-10-CM | POA: Diagnosis not present

## 2022-12-26 DIAGNOSIS — Z794 Long term (current) use of insulin: Secondary | ICD-10-CM | POA: Diagnosis not present

## 2022-12-26 DIAGNOSIS — E46 Unspecified protein-calorie malnutrition: Secondary | ICD-10-CM | POA: Diagnosis not present

## 2022-12-30 DIAGNOSIS — E785 Hyperlipidemia, unspecified: Secondary | ICD-10-CM | POA: Diagnosis not present

## 2022-12-30 DIAGNOSIS — E1122 Type 2 diabetes mellitus with diabetic chronic kidney disease: Secondary | ICD-10-CM | POA: Diagnosis not present

## 2022-12-30 DIAGNOSIS — Z86711 Personal history of pulmonary embolism: Secondary | ICD-10-CM | POA: Diagnosis not present

## 2022-12-30 DIAGNOSIS — I129 Hypertensive chronic kidney disease with stage 1 through stage 4 chronic kidney disease, or unspecified chronic kidney disease: Secondary | ICD-10-CM | POA: Diagnosis not present

## 2022-12-30 DIAGNOSIS — N1832 Chronic kidney disease, stage 3b: Secondary | ICD-10-CM | POA: Diagnosis not present

## 2023-01-01 ENCOUNTER — Ambulatory Visit: Payer: Self-pay

## 2023-01-01 NOTE — Patient Outreach (Signed)
  Care Coordination   Follow Up Visit Note   01/01/2023 Name: [name] MRN: [id number] DOB: 12/25/1950  [name] is a 72 y.o. year old female who sees Wenda Low, MD for primary care. I spoke with  [name] by phone today.  What matters to the patients health and wellness today?  I am doing OK.  I had eye surgery for cataract and I go back to the doctor on Friday. States she saw her primary care doctor and her A1C was better.  States her CBGs have been ranging 90-130.  States she tries to eat healthy and likes vegetables.    Goals Addressed             This Visit's Progress    Care Coordination Activities-diabetes self management       Care Coordination Interventions: Interventions Today    Flowsheet Row Most Recent Value  Chronic Disease   Chronic disease during today's visit Diabetes  General Interventions   General Interventions Discussed/Reviewed General Interventions Discussed, Annual Eye Exam, Durable Medical Equipment (DME), Doctor Visits, Labs, [employer] resources that have been provided and encouraged to call if she has any questions]  Labs Hgb A1c every 3 months  [Reviewed that goal to get A1C below 7% and praised for decreasing  to 7.9%]  Doctor Visits Discussed/Reviewed Doctor Visits Reviewed, PCP  Museum/gallery conservator (DME) Glucomoter  PCP/Specialist Visits Compliance with follow-up visit  [saw PCP 12/26/22]  Education Interventions   Education Provided Provided Education  Provided Verbal Education On Nutrition, Blood Sugar Monitoring, Other  [Reviewed to call eye doctor if she has any problems with her eye after her cataract surgery]  Nutrition Interventions   Nutrition Discussed/Reviewed Nutrition Reviewed, Adding fruits and vegetables  Pharmacy Interventions   Pharmacy Dicussed/Reviewed Pharmacy Topics Reviewed, Medications and their functions  Safety Interventions   Safety Discussed/Reviewed Safety Reviewed                 SDOH assessments and interventions completed:  No     Care Coordination Interventions:  Yes, provided   Follow up plan: Follow up call scheduled for 02/04/23    Encounter Outcome:  Pt. Visit Completed  Peter Garter RN, [facility], [place] Management [phone]

## 2023-01-01 NOTE — Patient Instructions (Signed)
Visit Information  Thank you for taking time to visit with me today. Please don't hesitate to contact me if I can be of assistance to you.   Following are the goals we discussed today:   Goals Addressed             This Visit's Progress    Care Coordination Activities-diabetes self management       Care Coordination Interventions: Interventions Today    Flowsheet Row Most Recent Value  Chronic Disease   Chronic disease during today's visit Diabetes  General Interventions   General Interventions Discussed/Reviewed General Interventions Discussed, Annual Eye Exam, Durable Medical Equipment (DME), Doctor Visits, Labs, [employer] resources that have been provided and encouraged to call if she has any questions]  Labs Hgb A1c every 3 months  [Reviewed that goal to get A1C below 7% and praised for decreasing  to 7.9%]  Doctor Visits Discussed/Reviewed Doctor Visits Reviewed, PCP  Museum/gallery conservator (DME) Glucomoter  PCP/Specialist Visits Compliance with follow-up visit  [saw PCP 12/26/22]  Education Interventions   Education Provided Provided Education  Provided Verbal Education On Nutrition, Blood Sugar Monitoring, Other  [Reviewed to call eye doctor if she has any problems with her eye after her cataract surgery]  Nutrition Interventions   Nutrition Discussed/Reviewed Nutrition Reviewed, Adding fruits and vegetables  Pharmacy Interventions   Pharmacy Dicussed/Reviewed Pharmacy Topics Reviewed, Medications and their functions  Safety Interventions   Safety Discussed/Reviewed Safety Reviewed                Our next appointment is by telephone on 02/04/23 at 1 PM  Please call the care guide team at [phone] if you need to cancel or reschedule your appointment.   If you are experiencing a Mental Health or [place] or need someone to talk to, please call the Suicide and Crisis Lifeline: 988 call the [place] National Suicide Prevention  Lifeline: 484-305-8117 or TTY: (615)634-2911 TTY 928-644-2681) to talk to a trained counselor call 1-800-273-TALK (toll free, 24 hour hotline) go to [facility] [address], [place] [phone]) call 911   Patient verbalizes understanding of instructions and care plan provided today and agrees to view in [place]. Active MyChart status and patient understanding of how to access instructions and care plan via MyChart confirmed with patient.     Telephone follow up appointment with care management team member scheduled for:02/04/23  SIGNATURE Peter Garter RN, [facility], [place] Management Coordinator [place] Management [phone]

## 2023-01-02 ENCOUNTER — Ambulatory Visit (INDEPENDENT_AMBULATORY_CARE_PROVIDER_SITE_OTHER): Payer: Medicare HMO | Admitting: Podiatry

## 2023-01-02 VITALS — BP 125/66 | HR 81

## 2023-01-02 DIAGNOSIS — M79674 Pain in right toe(s): Secondary | ICD-10-CM

## 2023-01-02 DIAGNOSIS — M2042 Other hammer toe(s) (acquired), left foot: Secondary | ICD-10-CM

## 2023-01-02 DIAGNOSIS — B351 Tinea unguium: Secondary | ICD-10-CM

## 2023-01-02 DIAGNOSIS — Z794 Long term (current) use of insulin: Secondary | ICD-10-CM

## 2023-01-02 DIAGNOSIS — E1169 Type 2 diabetes mellitus with other specified complication: Secondary | ICD-10-CM

## 2023-01-02 DIAGNOSIS — M79675 Pain in left toe(s): Secondary | ICD-10-CM | POA: Diagnosis not present

## 2023-01-02 DIAGNOSIS — M2041 Other hammer toe(s) (acquired), right foot: Secondary | ICD-10-CM

## 2023-01-02 DIAGNOSIS — Q828 Other specified congenital malformations of skin: Secondary | ICD-10-CM

## 2023-01-02 NOTE — Progress Notes (Signed)
Subjective: Chief Complaint  Patient presents with   Diabetes    Patient reports bilateral feet are feeling numb, Hx of neuropathy     72 year old female presents the office with above concerns.  States that she has neuropathy but she also presents today for thick, long, elongated nails that she is not able to trim her self.  She is also interested in diabetic shoes.  She thinks will be helpful as she gets discomfort to the bottoms of her feet.  No injuries.  Objective: AAO x3, NAD DP/PT pulses palpable bilaterally, CRT less than 3 seconds Sensation decreased with Semmes Weinstein monofilament. Nails are hypertrophic, dystrophic, brittle, discolored, elongated 10. No surrounding redness or drainage. Tenderness nails 1-5 bilaterally.  Minimal hyperkeratotic tissue noted left foot submetatarsal 3.  No ulcerations noted. She gets tenderness on the arch of the foot.  No area pinpoint tenderness.  Flexor, extensor tendons appear to be intact. No pain with calf compression, swelling, warmth, erythema  Assessment: Symptomatic onychomycosis  Plan: -All treatment options discussed with the patient including all alternatives, risks, complications.  -Sharply debrided nails x 10  without any complications or bleeding. -Will schedule for diabetic shoe measurement -We discussed stretching exercises daily. -Patient encouraged to call the office with any questions, concerns, change in symptoms.   Trula Slade DPM

## 2023-01-02 NOTE — Patient Instructions (Signed)

## 2023-01-15 ENCOUNTER — Ambulatory Visit (INDEPENDENT_AMBULATORY_CARE_PROVIDER_SITE_OTHER): Payer: Medicare HMO

## 2023-01-15 DIAGNOSIS — M2041 Other hammer toe(s) (acquired), right foot: Secondary | ICD-10-CM

## 2023-01-15 DIAGNOSIS — Z794 Long term (current) use of insulin: Secondary | ICD-10-CM

## 2023-01-15 DIAGNOSIS — Q828 Other specified congenital malformations of skin: Secondary | ICD-10-CM

## 2023-01-15 DIAGNOSIS — E1169 Type 2 diabetes mellitus with other specified complication: Secondary | ICD-10-CM

## 2023-01-15 DIAGNOSIS — M2042 Other hammer toe(s) (acquired), left foot: Secondary | ICD-10-CM

## 2023-01-15 NOTE — Progress Notes (Signed)
Patient presents to the office today for diabetic shoe and insole measuring.  Patient was measured with brannock device to determine size and width for 1 pair of extra depth shoes and foam casted for 3 pair of insoles.   ABN signed.   Documentation of medical necessity will be sent to patient's treating diabetic doctor to verify and sign.   Patient's diabetic provider: Wenda Low, MD   Shoes and insoles will be ordered at that time and patient will be notified for an appointment for fitting when they arrive.   Patient shoe selection-   1st   Shoe choice:   [id number]  Shoe size ordered: 8.28M

## 2023-01-29 ENCOUNTER — Telehealth: Payer: Self-pay | Admitting: *Deleted

## 2023-01-29 NOTE — Progress Notes (Signed)
  Care Coordination Note  01/29/2023 Name: [name] MRN: [id number] DOB: 1951-05-18  [name] is a 72 y.o. year old female who is a primary care patient of Wenda Low, MD and is actively engaged with the care management team. I reached out to [name] by phone today to assist with re-scheduling a follow up visit with the RN Case Manager  Follow up plan: Unsuccessful telephone outreach attempt made. [place]  Direct Dial: [phone]

## 2023-02-03 NOTE — Progress Notes (Unsigned)
  Care Coordination Note  02/03/2023 Name: [name] MRN: [id number] DOB: [birth year]  [name] is a 72 y.o. year old female who is a primary care patient of Georgann Housekeeper, MD and is actively engaged with the care management team. I reached out to [name] by phone today to assist with re-scheduling a follow up visit with the RN Case Manager  Follow up plan: Unsuccessful telephone outreach attempt made. A HIPAA compliant phone message was left for the patient providing contact information and requesting a return call.   [facility]  Care Coordination Care Guide  Direct Dial: [phone]

## 2023-02-04 NOTE — Progress Notes (Unsigned)
  Care Coordination Note  02/04/2023 Name: [name] MRN: [id number] DOB: 01-Aug-1951  [name] is a 72 y.o. year old female who is a primary care patient of Georgann Housekeeper, MD and is actively engaged with the care management team. I reached out to [name] by phone today to assist with re-scheduling a follow up visit with the RN Case Manager  Follow up plan: Unsuccessful telephone outreach attempt made. A HIPAA compliant phone message was left for the patient providing contact information and requesting a return call.  We have been unable to make contact with the patient for follow up. The care management team is available to follow up with the patient after provider conversation with the patient regarding recommendation for care management engagement and subsequent re-referral to the care management team.   [facility] Coordination Care Guide  Direct Dial: [phone]

## 2023-02-06 DIAGNOSIS — G4700 Insomnia, unspecified: Secondary | ICD-10-CM | POA: Diagnosis not present

## 2023-02-06 DIAGNOSIS — N182 Chronic kidney disease, stage 2 (mild): Secondary | ICD-10-CM | POA: Diagnosis not present

## 2023-02-06 DIAGNOSIS — E7800 Pure hypercholesterolemia, unspecified: Secondary | ICD-10-CM | POA: Diagnosis not present

## 2023-02-06 DIAGNOSIS — I129 Hypertensive chronic kidney disease with stage 1 through stage 4 chronic kidney disease, or unspecified chronic kidney disease: Secondary | ICD-10-CM | POA: Diagnosis not present

## 2023-02-06 DIAGNOSIS — K219 Gastro-esophageal reflux disease without esophagitis: Secondary | ICD-10-CM | POA: Diagnosis not present

## 2023-02-06 DIAGNOSIS — E1121 Type 2 diabetes mellitus with diabetic nephropathy: Secondary | ICD-10-CM | POA: Diagnosis not present

## 2023-02-06 DIAGNOSIS — J4520 Mild intermittent asthma, uncomplicated: Secondary | ICD-10-CM | POA: Diagnosis not present

## 2023-02-06 DIAGNOSIS — I5030 Unspecified diastolic (congestive) heart failure: Secondary | ICD-10-CM | POA: Diagnosis not present

## 2023-02-06 NOTE — Progress Notes (Signed)
  Care Coordination Note  02/06/2023 Name: [name] MRN: [id number] DOB: 27-Oct-1951  [name] is a 72 y.o. year old female who is a primary care patient of Georgann Housekeeper, MD and is actively engaged with the care management team. I reached out to [name] by phone today to assist with re-scheduling a follow up visit with the RN Case Manager  Follow up plan: Telephone appointment with care management team member scheduled for:02/13/23  [facility] Coordination Care Guide  Direct Dial: [phone]

## 2023-02-07 DIAGNOSIS — Z961 Presence of intraocular lens: Secondary | ICD-10-CM | POA: Diagnosis not present

## 2023-02-07 DIAGNOSIS — R6889 Other general symptoms and signs: Secondary | ICD-10-CM | POA: Diagnosis not present

## 2023-02-13 ENCOUNTER — Ambulatory Visit: Payer: Self-pay

## 2023-02-13 NOTE — Patient Instructions (Signed)
Visit Information  Thank you for taking time to visit with me today. Please don't hesitate to contact me if I can be of assistance to you.   Following are the goals we discussed today:   Goals Addressed             This Visit's Progress    Care Coordination Activities-diabetes self management       Care Coordination Interventions: Interventions Today    Flowsheet Row Most Recent Value  Chronic Disease   Chronic disease during today's visit Diabetes  General Interventions   General Interventions Discussed/Reviewed General Interventions Reviewed, Annual Foot Exam, Durable Medical Equipment (DME), Doctor Visits  Doctor Visits Discussed/Reviewed Doctor Visits Reviewed, PCP  Durable Medical Equipment (DME) Glucomoter, BP Cuff  [Reviewed to check CBG twice a day.  Reviewed goals of 80-130 fasting and <180 2 hrs post prandial.]  PCP/Specialist Visits Compliance with follow-up visit  [Reviewed to keep PCP appointment on 03/27/23]  Exercise Interventions   Exercise Discussed/Reviewed Exercise Discussed, Physical Activity  Physical Activity Discussed/Reviewed Physical Activity Discussed, Types of exercise, Gym  [Reviewed benefits of exercise for diabetes.  Encouraged to go back to her gym to use pool or machines.  Praised for walking and encouraged to increase the time she is walking]  Education Interventions   Education Provided Provided Education  Provided Verbal Education On Nutrition, Exercise, Blood Sugar Monitoring  Nutrition Interventions   Nutrition Discussed/Reviewed Nutrition Reviewed, Adding fruits and vegetables, Portion sizes, Decreasing salt, Increaing proteins  Pharmacy Interventions   Pharmacy Dicussed/Reviewed Pharmacy Topics Reviewed, Medications and their functions  Safety Interventions   Safety Discussed/Reviewed Safety Reviewed, Fall Risk, Home Safety  Home Safety Assistive Devices  [Reviewed to always use cane and keep phone with her at all times]                 Our next appointment is by telephone on 03/04/23 at 1:30 PM  Please call the care guide team at [phone] if you need to cancel or reschedule your appointment.   If you are experiencing a Mental Health or Behavioral Health Crisis or need someone to talk to, please call the Suicide and Crisis Lifeline: 988 call the [place] National Suicide Prevention Lifeline: 8568847505 or TTY: 952-159-1768 TTY 843-053-3415) to talk to a trained counselor call 1-800-273-TALK (toll free, 24 hour hotline) go to [facility] [address] [place] [phone]) call 911   Patient verbalizes understanding of instructions and care plan provided today and agrees to view in MyChart. Active MyChart status and patient understanding of how to access instructions and care plan via MyChart confirmed with patient.     Telephone follow up appointment with care management team member scheduled for: 03/04/23  SIGNATURE Dudley Major RN, [facility] Coss, CDE Care Management Coordinator [facility] Care Management [phone]

## 2023-02-13 NOTE — Patient Outreach (Signed)
  Care Coordination   Follow Up Visit Note   02/13/2023 Name: [name] MRN: [id number] DOB: [birth year]  [name] is a 72 y.o. year old female who sees Georgann Housekeeper, MD for primary care. I spoke with  [name] by phone today.  What matters to the patients health and wellness today?  States she has been feeling good. Reports she is trying to walk outside for 5-10 minutes weather permitting.  Denies any falls.  States her CBGs have been ranging from 90-130 and denies any low readings    Goals Addressed             This Visit's Progress    Care Coordination Activities-diabetes self management       Care Coordination Interventions: Interventions Today    Flowsheet Row Most Recent Value  Chronic Disease   Chronic disease during today's visit Diabetes  General Interventions   General Interventions Discussed/Reviewed General Interventions Reviewed, Annual Foot Exam, Durable Medical Equipment (DME), Doctor Visits  Doctor Visits Discussed/Reviewed Doctor Visits Reviewed, PCP  Durable Medical Equipment (DME) Glucomoter, BP Cuff  [Reviewed to check CBG twice a day.  Reviewed goals of 80-130 fasting and <180 2 hrs post prandial.]  PCP/Specialist Visits Compliance with follow-up visit  [Reviewed to keep PCP appointment on 03/27/23]  Exercise Interventions   Exercise Discussed/Reviewed Exercise Discussed, Physical Activity  Physical Activity Discussed/Reviewed Physical Activity Discussed, Types of exercise, Gym  [Reviewed benefits of exercise for diabetes.  Encouraged to go back to her gym to use pool or machines.  Praised for walking and encouraged to increase the time she is walking]  Education Interventions   Education Provided Provided Education  Provided Verbal Education On Nutrition, Exercise, Blood Sugar Monitoring  Nutrition Interventions   Nutrition Discussed/Reviewed Nutrition Reviewed, Adding fruits and vegetables, Portion sizes, Decreasing salt, Increaing  proteins  Pharmacy Interventions   Pharmacy Dicussed/Reviewed Pharmacy Topics Reviewed, Medications and their functions  Safety Interventions   Safety Discussed/Reviewed Safety Reviewed, Fall Risk, Home Safety  Home Safety Assistive Devices  [Reviewed to always use cane and keep phone with her at all times]                SDOH assessments and interventions completed:  Yes  SDOH Interventions Today    Flowsheet Row Most Recent Value  SDOH Interventions   Physical Activity Interventions Other (Comments)  [Reviewed insurance benefit for exercise]        Care Coordination Interventions:  Yes, provided   Follow up plan: Follow up call scheduled for 03/04/23    Encounter Outcome:  Pt. Visit Completed  Dudley Major RN, [facility], CDE Care Management Coordinator [facility] Care Management [phone]

## 2023-02-14 ENCOUNTER — Ambulatory Visit: Payer: Medicare HMO | Admitting: Internal Medicine

## 2023-02-21 ENCOUNTER — Ambulatory Visit: Payer: Medicare HMO | Admitting: Internal Medicine

## 2023-02-21 ENCOUNTER — Encounter: Payer: Self-pay | Admitting: Internal Medicine

## 2023-02-21 VITALS — BP 140/74 | HR 63 | Ht 65.0 in | Wt 168.0 lb

## 2023-02-21 DIAGNOSIS — R6889 Other general symptoms and signs: Secondary | ICD-10-CM | POA: Diagnosis not present

## 2023-02-21 DIAGNOSIS — I10 Essential (primary) hypertension: Secondary | ICD-10-CM | POA: Diagnosis not present

## 2023-02-21 DIAGNOSIS — Z794 Long term (current) use of insulin: Secondary | ICD-10-CM | POA: Diagnosis not present

## 2023-02-21 DIAGNOSIS — E119 Type 2 diabetes mellitus without complications: Secondary | ICD-10-CM | POA: Diagnosis not present

## 2023-02-21 DIAGNOSIS — Z8674 Personal history of sudden cardiac arrest: Secondary | ICD-10-CM | POA: Diagnosis not present

## 2023-02-21 NOTE — Progress Notes (Signed)
Primary Physician/Referring:  Georgann Housekeeper, MD  Patient ID: [name], female    DOB: Aug 17, 1951, 72 y.o.   MRN: [id number]  Chief Complaint  Patient presents with   History of cardiac arrest   Follow-up   HPI:    [name]  is a 72 y.o. female with past medical history significant for hypertension, diabetes, and cardiac arrest who is here for a follow-up visit. She has been feeling well since the last time I saw her. She is trying to lose some weight so she is going to go back to the gym or pool to help her lose weight. She watches what she eats and follows a low-sodium diet. She does not eat canned soup or foods. Patient is overall doing well. Patient denies chest pain, shortness of breath, palpitations, diaphoresis, syncope, orthopnea, edema, PND, claudication.  Past Medical History:  Diagnosis Date   Anxiety    Arthritis    Asthma    Cardiac arrest (HCC) 10/28/2021   Depression    Diabetes mellitus without complication (HCC)    Fibromyalgia    GERD (gastroesophageal reflux disease)    Hyperlipemia    Hypertension    PONV (postoperative nausea and vomiting)    Past Surgical History:  Procedure Laterality Date   COLONOSCOPY     COLONOSCOPY WITH PROPOFOL N/A 02/20/2022   Procedure: COLONOSCOPY WITH PROPOFOL;  Surgeon: Kathi Der, MD;  Location: [facility] ENDOSCOPY;  Service: Gastroenterology;  Laterality: N/A;   COLONOSCOPY WITH PROPOFOL N/A 06/06/2022   Procedure: COLONOSCOPY WITH PROPOFOL;  Surgeon: Willis Modena, MD;  Location: [facility] ENDOSCOPY;  Service: Gastroenterology;  Laterality: N/A;   DILATION AND CURETTAGE OF UTERUS     ESOPHAGOGASTRODUODENOSCOPY N/A 02/20/2022   Procedure: ESOPHAGOGASTRODUODENOSCOPY (EGD);  Surgeon: Kathi Der, MD;  Location: [facility] ENDOSCOPY;  Service: Gastroenterology;  Laterality: N/A;   IR EMBO ART  VEN HEMORR LYMPH EXTRAV  INC GUIDE ROADMAPPING  11/01/2021   IR FLUORO GUIDE CV LINE RIGHT  11/20/2021   IR IVC FILTER PLMT / S&I /IMG  GUID/MOD SED  06/08/2022   IR REMOVAL TUN CV CATH W/O FL  12/20/2021   IR THROMBECT PRIM MECH INIT (INCLU) MOD SED  10/29/2021   IR US GUIDE VASC ACCESS RIGHT  10/29/2021   IR US GUIDE VASC ACCESS RIGHT  11/01/2021   IR US GUIDE VASC ACCESS RIGHT  11/21/2021   KNEE ARTHROSCOPY  [id number]   left   POLYPECTOMY  02/20/2022   Procedure: POLYPECTOMY;  Surgeon: Kathi Der, MD;  Location: [facility] ENDOSCOPY;  Service: Gastroenterology;;   SHOULDER ARTHROSCOPY     left   TRIGGER FINGER RELEASE  08/13/2012   Procedure: RELEASE TRIGGER FINGER/A-1 PULLEY;  Surgeon: Wyn Forster., MD;  Location: [facility];  Service: Orthopedics;  Laterality: Right;  long finger   TUBAL LIGATION     Family History  Problem Relation Age of Onset   Diabetes Mother    Heart attack Father    Diabetes Sister    Throat cancer Brother    Hypertension Daughter     Social History   Tobacco Use   Smoking status: Former    Packs/day: 0.50    Years: 20.00    Additional pack years: 0.00    Total pack years: 10.00    Types: Cigarettes    Quit date: 08/10/2010    Years since quitting: 12.5   Smokeless tobacco: Never  Substance Use Topics   Alcohol use: No   Marital Status:  Divorced  ROS  Review of Systems  Cardiovascular:  Negative for chest pain, claudication, dyspnea on exertion, leg swelling, palpitations and syncope.   Objective  Blood pressure (!) 140/74, pulse 63, height 5\' 5"  (1.651 m), weight 168 lb (76.2 kg), SpO2 97 %. Body mass index is 27.96 kg/m.     02/21/2023   11:30 AM 01/02/2023    2:48 PM 11/15/2022   11:39 AM  Vitals with BMI  Height 5\' 5"   5\' 5"   Weight 168 lbs  155 lbs 13 oz  BMI 27.96  25.93  Systolic 140 125 161  Diastolic 74 66 84  Pulse 63 81 83     Physical Exam Vitals reviewed.  HENT:     Head: Normocephalic and atraumatic.  Cardiovascular:     Rate and Rhythm: Normal rate and regular rhythm.     Pulses: Normal pulses.     Heart sounds: Normal heart sounds.  No murmur heard. Pulmonary:     Effort: Pulmonary effort is normal.     Breath sounds: Normal breath sounds.  Abdominal:     General: Bowel sounds are normal.  Musculoskeletal:     Right lower leg: No edema.     Left lower leg: No edema.  Skin:    General: Skin is warm and dry.  Neurological:     Mental Status: She is alert.     Medications and allergies   Allergies  Allergen Reactions   Darvon [Propoxyphene] Itching   Hydrocodone Bit-Homatrop Mbr Itching   Metformin Nausea And Vomiting   Pentazocine Other (See Comments)      jitters   Percocet [Oxycodone-Acetaminophen] Itching   Sulfa Antibiotics Hives   Tetracaine Hcl Itching   Tetracyclines & Related Hives   Tramadol Itching   Benadryl [Diphenhydramine] Itching and Other (See Comments)    Can only take dye free. States the dye causes itching.   Other     Other reaction(s): Unknown. States reaction was to nasal spray that caused pupils to shrink   Aspirin Nausea And Vomiting     Medication list after today's encounter   Current Outpatient Medications:    acetaminophen (TYLENOL) 325 MG tablet, Take 2 tablets (650 mg total) by mouth every 4 (four) hours as needed for fever (greater than 37.6 C). (Patient taking differently: Take 650 mg by mouth daily as needed (pain).), Disp: , Rfl:    albuterol (VENTOLIN HFA) 108 (90 Base) MCG/ACT inhaler, Inhale 1-2 puffs into the lungs every 4 (four) hours as needed for wheezing or shortness of breath. (Patient taking differently: Inhale 2 puffs into the lungs daily as needed for wheezing or shortness of breath.), Disp: 8.5 g, Rfl: 0   ALPRAZolam (XANAX) 0.25 MG tablet, Take 0.25 mg by mouth daily as needed., Disp: , Rfl:    amLODipine (NORVASC) 10 MG tablet, Take 5 mg by mouth daily., Disp: , Rfl:    atorvastatin (LIPITOR) 20 MG tablet, Take 1 tablet (20 mg total) by mouth daily., Disp: 30 tablet, Rfl: 0   B Complex-C (B-COMPLEX WITH VITAMIN C) tablet, Take 1 tablet by mouth daily.,  Disp: 30 tablet, Rfl: 0   dicyclomine (BENTYL) 10 MG capsule, Take 1 capsule (10 mg total) by mouth 3 (three) times daily before meals. (Patient taking differently: Take 10 mg by mouth 2 (two) times daily as needed (stomach cramps).), Disp: 90 capsule, Rfl: 0   FARXIGA 10 MG TABS tablet, Take 10 mg by mouth daily., Disp: , Rfl:  Ferrous Sulfate (IRON PO), Take 1 tablet by mouth daily., Disp: , Rfl:    Glucerna (GLUCERNA) LIQD, Take 237 mLs by mouth 2 (two) times daily between meals., Disp: , Rfl:    LANTUS SOLOSTAR 100 UNIT/ML Solostar Pen, Inject 20 Units into the skin daily., Disp: , Rfl:    lidocaine (LIDODERM) 5 %, Place 2 patches onto the skin daily. Remove & Discard patch within 12 hours or as directed by MD (Patient taking differently: Place 1 patch onto the skin daily as needed (pain). Remove & Discard patch within 12 hours or as directed by MD), Disp: 60 patch, Rfl: 0   pantoprazole (PROTONIX) 40 MG tablet, Take 1 tablet (40 mg total) by mouth 2 (two) times daily., Disp: 60 tablet, Rfl: 0   temazepam (RESTORIL) 7.5 MG capsule, Take 1 capsule (7.5 mg total) by mouth at bedtime as needed for sleep., Disp: 30 capsule, Rfl: 0   Vitamin D, Ergocalciferol, (DRISDOL) 1.25 MG (50000 UNIT) CAPS capsule, Take 1 capsule (50,000 Units total) by mouth once a week., Disp: 5 capsule, Rfl: 0  Laboratory examination:   Lab Results  Component Value Date   NA 140 07/14/2022   K 3.8 07/14/2022   CO2 19 (L) 07/14/2022   GLUCOSE 166 (H) 07/14/2022   BUN 28 (H) 07/14/2022   CREATININE 1.74 (H) 07/14/2022   CALCIUM 9.4 07/14/2022   GFRNONAA 31 (L) 07/14/2022       Latest Ref Rng & Units 07/14/2022   12:55 PM 06/09/2022    1:44 AM 06/08/2022    2:18 AM  CMP  Glucose 70 - 99 mg/dL 161  096  045   BUN 8 - 23 mg/dL 28  13  12    Creatinine 0.44 - 1.00 mg/dL 4.09  8.11  9.14   Sodium 135 - 145 mmol/L 140  142  142   Potassium 3.5 - 5.1 mmol/L 3.8  3.5  3.4   Chloride 98 - 111 mmol/L 111  107  111    CO2 22 - 32 mmol/L 19  28  27    Calcium 8.9 - 10.3 mg/dL 9.4  7.9  7.7   Total Protein 6.5 - 8.1 g/dL 7.1     Total Bilirubin 0.3 - 1.2 mg/dL 0.3     Alkaline Phos 38 - 126 U/L 84     AST 15 - 41 U/L 16     ALT 0 - 44 U/L 12         Latest Ref Rng & Units 07/14/2022   12:55 PM 06/08/2022   10:48 AM 06/06/2022    4:43 PM  CBC  WBC 4.0 - 10.5 K/uL 8.2  8.2  10.6   Hemoglobin 12.0 - 15.0 g/dL 78.2  95.6  21.3   Hematocrit 36.0 - 46.0 % 34.9  31.0  32.4   Platelets 150 - 400 K/uL 301  173  160     Lipid Panel No results for input(s): "CHOL", "TRIG", "LDLCALC", "VLDL", "HDL", "CHOLHDL", "LDLDIRECT" in the last 8760 hours.   HEMOGLOBIN A1C Lab Results  Component Value Date   HGBA1C 6.3 (H) 01/09/2022   MPG 134 01/09/2022   TSH No results for input(s): "TSH" in the last 8760 hours.   External labs:     Radiology:    Cardiac Studies:   10/28/2021 ECHO IMPRESSIONS   1. Right ventricular function is abnormal. In the setting of cardiac  arrest, would rule out pulmonary embolism if clinically indicated.   2. Left ventricular  ejection fraction, by estimation, is 70 to 75%. The  left ventricle has hyperdynamic function. The left ventricle has no  regional wall motion abnormalities. There is moderate concentric left  ventricular hypertrophy. Left ventricular  diastolic parameters are indeterminate.   3. Hypokinesis of the mid-apical right ventricle with normal systolic  function at the base. This pattern is opposite of McConnell's sign, which  can be seen in acute pulmonary embolism. Right ventricular systolic  function is moderately reduced. The right   ventricular size is normal. There is severely elevated pulmonary artery  systolic pressure.   4. Systolic anterior motion (SAM) of the mitral valve. The mitral valve  is normal in structure. No evidence of mitral valve regurgitation. No  evidence of mitral stenosis.   5. Tricuspid valve regurgitation is moderate to severe.    6. The aortic valve is tricuspid. Aortic valve regurgitation is not  visualized. No aortic stenosis is present.   7. The inferior vena cava is normal in size with <50% respiratory  variability, suggesting right atrial pressure of 8 mmHg.     12/18/2021 ECHO IMPRESSIONS   1. Left ventricular ejection fraction, by estimation, is 60 to 65%. The  left ventricle has normal function. The left ventricle has no regional  wall motion abnormalities. There is mild concentric left ventricular  hypertrophy. Left ventricular diastolic  parameters are consistent with Grade I diastolic dysfunction (impaired  relaxation).   2. Right ventricular systolic function is normal. The right ventricular  size is normal.   3. The mitral valve is normal in structure. No evidence of mitral valve  regurgitation. No evidence of mitral stenosis.   4. The aortic valve is normal in structure. Aortic valve regurgitation is  not visualized. No aortic stenosis is present.   5. The inferior vena cava is normal in size with greater than 50%  respiratory variability, suggesting right atrial pressure of 3 mmHg.     01/10/2022 ECHO IMPRESSIONS   1. Left ventricular ejection fraction, by estimation, is 60 to 65%. The  left ventricle has normal function. The left ventricle has no regional  wall motion abnormalities. Left ventricular diastolic parameters are  consistent with Grade I diastolic  dysfunction (impaired relaxation).   2. Right ventricular systolic function is normal. The right ventricular  size is normal.   3. The mitral valve is normal in structure. No evidence of mitral valve  regurgitation. No evidence of mitral stenosis.   4. The aortic valve is tricuspid. Aortic valve regurgitation is not  visualized. No aortic stenosis is present.    Modified Bruce exercise Tetrofosmin stress test 10/07/2022: No previous exam available for comparison. Modified Bruce nuclear stress test performed using 1-day protocol.  Patient achieved 2.9 METS and reached 88% MPHR. No chest pain reported. Heart rate and hemodynamic response were normal. Stress EKG revealed no ischemic changes. Normal myocardial perfusion. Stress LVEF 71%. Low risk study.  EKG:   10/04/2022: Sinus Rhythm  Low voltage in limb leads. -Left atrial enlargement.  Assessment     ICD-10-CM   1. History of cardiac arrest  Z86.74     2. Essential hypertension  I10     3. Type 2 diabetes mellitus without complication, with long-term current use of insulin (HCC)  E11.9    Z79.4        No orders of the defined types were placed in this encounter.   No orders of the defined types were placed in this encounter.   Medications Discontinued During This  Encounter  Medication Reason   folic acid (FOLVITE) 1 MG tablet Completed Course      Recommendations:   HYDEIA MCATEE is a 72 y.o.  female with history of cardiac arrest 10/28/2021   History of cardiac arrest Total downtime 30 minutes Cause of arrest was massive pulmonary embolus causing cor pulmonale Patient had extensive hospital course, stayed for 2 months She was on hemodialysis for the majority of that time Patient was also on Eliquis for while after but she had bloody stools and therefore it was stopped Patient has recovered very nicely Nuclear stress test negative for ischemia and TTEs show recovered LVEF   Essential hypertension Continue current medications Encourage low-sodium diet, less than 2000 mg daily. BP at home has been <130/80 but is always a little elevated here in office Follow-up in 6 months or sooner if needed.   Type 2 diabetes mellitus without complication, with long-term current use of insulin (HCC) Continue current regimen, PCP following     Clotilde Dieter, DO, [facility]  02/21/2023, 11:50 AM Office: [phone] Pager: [phone]

## 2023-03-04 ENCOUNTER — Ambulatory Visit: Payer: Self-pay

## 2023-03-04 NOTE — Patient Outreach (Signed)
  Care Coordination   Follow Up Visit Note   03/04/2023 Name: [name] MRN: [id number] DOB: [birth year]  [name] is a 72 y.o. year old female who sees Georgann Housekeeper, MD for primary care. I spoke with  [name] by phone today.  What matters to the patients health and wellness today?  States she has been doing great. States she is feeling good and had a good check up with cardiology a few weeks ago.  States her CBG range from 100-130.  States she is eating good.    Goals Addressed             This Visit's Progress    COMPLETED: Care Coordination Activities-diabetes self management       Care Coordination Interventions: Interventions Today    Flowsheet Row Most Recent Value  Chronic Disease   Chronic disease during today's visit Diabetes  General Interventions   General Interventions Discussed/Reviewed General Interventions Reviewed, Doctor Visits  [No further RNCM interventions needed. goals met]  Doctor Visits Discussed/Reviewed Doctor Visits Reviewed, Annual Wellness Visits, PCP  Durable Medical Equipment (DME) Glucomoter, BP Cuff  PCP/Specialist Visits Compliance with follow-up visit  [Reinforced to keep appt with PCP on 03/27/23]  Exercise Interventions   Exercise Discussed/Reviewed Exercise Reviewed, Physical Activity  [Reinforced benefits of regular exercise and encouraged pts plans to go back to the pool to exercise]  Physical Activity Discussed/Reviewed Physical Activity Reviewed, Types of exercise  Education Interventions   Education Provided Provided Education  Provided Verbal Education On Nutrition, Exercise, Medication, When to see the doctor, Blood Sugar Monitoring  Nutrition Interventions   Nutrition Discussed/Reviewed Nutrition Reviewed, Adding fruits and vegetables, Fluid intake, Decreasing salt, Increaing proteins, Portion sizes  Pharmacy Interventions   Pharmacy Dicussed/Reviewed Pharmacy Topics Reviewed  Safety Interventions   Safety  Discussed/Reviewed Safety Reviewed, Fall Risk, Home Safety  Home Safety Assistive Devices  [Reinforced to use cane at all times]       Goals met and case closed         SDOH assessments and interventions completed:  No     Care Coordination Interventions:  Yes, provided   Follow up plan: No further intervention required. Goals met and case closed  Encounter Outcome:  Pt. Visit Completed  Dudley Major RN, BSN,CCM, CDE Care Management Coordinator [facility] Care Management [phone]

## 2023-03-04 NOTE — Patient Instructions (Signed)
Visit Information  Thank you for taking time to visit with me today. Please don't hesitate to contact me if I can be of assistance to you.   Following are the goals we discussed today:   Goals Addressed             This Visit's Progress    COMPLETED: Care Coordination Activities-diabetes self management       Care Coordination Interventions: Interventions Today    Flowsheet Row Most Recent Value  Chronic Disease   Chronic disease during today's visit Diabetes  General Interventions   General Interventions Discussed/Reviewed General Interventions Reviewed, Doctor Visits  [No further RNCM interventions needed. goals met]  Doctor Visits Discussed/Reviewed Doctor Visits Reviewed, Annual Wellness Visits, PCP  Durable Medical Equipment (DME) Glucomoter, BP Cuff  PCP/Specialist Visits Compliance with follow-up visit  [Reinforced to keep appt with PCP on 03/27/23]  Exercise Interventions   Exercise Discussed/Reviewed Exercise Reviewed, Physical Activity  [Reinforced benefits of regular exercise and encouraged pts plans to go back to the pool to exercise]  Physical Activity Discussed/Reviewed Physical Activity Reviewed, Types of exercise  Education Interventions   Education Provided Provided Education  Provided Verbal Education On Nutrition, Exercise, Medication, When to see the doctor, Blood Sugar Monitoring  Nutrition Interventions   Nutrition Discussed/Reviewed Nutrition Reviewed, Adding fruits and vegetables, Fluid intake, Decreasing salt, Increaing proteins, Portion sizes  Pharmacy Interventions   Pharmacy Dicussed/Reviewed Pharmacy Topics Reviewed  Safety Interventions   Safety Discussed/Reviewed Safety Reviewed, Fall Risk, Home Safety  Home Safety Assistive Devices  [Reinforced to use cane at all times]       Goals met and case closed         If you are experiencing a Mental Health or Behavioral Health Crisis or need someone to talk to, please call the Suicide and Crisis  Lifeline: 988 call the [place] National Suicide Prevention Lifeline: 318-131-8901 or TTY: 801-821-8761 TTY 867-347-0645) to talk to a trained counselor call 1-800-273-TALK (toll free, 24 hour hotline) go to [facility] [address], [place] [phone]) call 911   Patient verbalizes understanding of instructions and care plan provided today and agrees to view in MyChart. Active MyChart status and patient understanding of how to access instructions and care plan via MyChart confirmed with patient.     No further follow up required:    SIGNATURE Dudley Major RN, Maximiano Coss, CDE Care Management Coordinator [facility] Care Management [phone]

## 2023-03-18 ENCOUNTER — Other Ambulatory Visit: Payer: Self-pay

## 2023-03-18 DIAGNOSIS — R6889 Other general symptoms and signs: Secondary | ICD-10-CM | POA: Diagnosis not present

## 2023-03-18 NOTE — Progress Notes (Signed)
   [name] [birth year] [id number]  Patient seen by Buddy Duty , PharmD Candidate on 03/18/23.  Blood Pressure Readings: Systolic BP today: 144 Diastolic BP today: 82    Medication review was performed.     The following barriers to adherence were noted:     Interventions:    The patient has follow up scheduled:  PCP: Georgann Housekeeper, MD   Seward Meth, Student-PharmD

## 2023-03-27 DIAGNOSIS — I5030 Unspecified diastolic (congestive) heart failure: Secondary | ICD-10-CM | POA: Diagnosis not present

## 2023-03-27 DIAGNOSIS — E1142 Type 2 diabetes mellitus with diabetic polyneuropathy: Secondary | ICD-10-CM | POA: Diagnosis not present

## 2023-03-27 DIAGNOSIS — N184 Chronic kidney disease, stage 4 (severe): Secondary | ICD-10-CM | POA: Diagnosis not present

## 2023-03-27 DIAGNOSIS — E1122 Type 2 diabetes mellitus with diabetic chronic kidney disease: Secondary | ICD-10-CM | POA: Diagnosis not present

## 2023-03-27 DIAGNOSIS — E7800 Pure hypercholesterolemia, unspecified: Secondary | ICD-10-CM | POA: Diagnosis not present

## 2023-03-27 DIAGNOSIS — R6889 Other general symptoms and signs: Secondary | ICD-10-CM | POA: Diagnosis not present

## 2023-03-27 DIAGNOSIS — I130 Hypertensive heart and chronic kidney disease with heart failure and stage 1 through stage 4 chronic kidney disease, or unspecified chronic kidney disease: Secondary | ICD-10-CM | POA: Diagnosis not present

## 2023-03-27 DIAGNOSIS — I700 Atherosclerosis of aorta: Secondary | ICD-10-CM | POA: Diagnosis not present

## 2023-03-27 DIAGNOSIS — G629 Polyneuropathy, unspecified: Secondary | ICD-10-CM | POA: Diagnosis not present

## 2023-03-27 DIAGNOSIS — M109 Gout, unspecified: Secondary | ICD-10-CM | POA: Diagnosis not present

## 2023-04-03 ENCOUNTER — Ambulatory Visit (INDEPENDENT_AMBULATORY_CARE_PROVIDER_SITE_OTHER): Payer: Medicare HMO | Admitting: Podiatry

## 2023-04-03 ENCOUNTER — Ambulatory Visit (INDEPENDENT_AMBULATORY_CARE_PROVIDER_SITE_OTHER): Payer: Medicare HMO

## 2023-04-03 DIAGNOSIS — M79674 Pain in right toe(s): Secondary | ICD-10-CM

## 2023-04-03 DIAGNOSIS — B351 Tinea unguium: Secondary | ICD-10-CM

## 2023-04-03 DIAGNOSIS — E1169 Type 2 diabetes mellitus with other specified complication: Secondary | ICD-10-CM

## 2023-04-03 DIAGNOSIS — Z794 Long term (current) use of insulin: Secondary | ICD-10-CM

## 2023-04-03 DIAGNOSIS — M21371 Foot drop, right foot: Secondary | ICD-10-CM

## 2023-04-03 DIAGNOSIS — M79675 Pain in left toe(s): Secondary | ICD-10-CM

## 2023-04-03 DIAGNOSIS — R6889 Other general symptoms and signs: Secondary | ICD-10-CM | POA: Diagnosis not present

## 2023-04-03 NOTE — Progress Notes (Signed)
Subjective: Chief Complaint  Patient presents with   Nail Problem    Nail trim     72 year old female presents the office with above concerns.  States that she has neuropathy but she also presents today for thick, long, elongated nails that she is not able to trim her self.  She also states that she has not able to obtain toe right foot up.  After discussion with her daughter she had foot drop on the hospital previously.  She does trip occasionally.  Objective: AAO x3, NAD DP/PT pulses palpable bilaterally, CRT less than 3 seconds Sensation decreased with Semmes Weinstein monofilament. Nails are hypertrophic, dystrophic, brittle, discolored, elongated 10. No surrounding redness or drainage. Tenderness nails 1-5 bilaterally.  Minimal hyperkeratotic tissue noted left foot submetatarsal 3. . Decreased dorsiflexion of the right ankle.  There is significant pain in the ankle. No open lesions. No pain with calf compression, swelling, warmth, erythema  Assessment: Symptomatic onychomycosis; drop foot right  Plan: -All treatment options discussed with the patient including all alternatives, risks, complications.  -X-rays of right ankle were obtained.  3 views were obtained.  Calcaneal spurring is present.  No evidence of acute fracture.  Osteopenia. -Sharply debrided nails x 10  without any complications or bleeding. -For the dropfoot we discussed physical therapy and referral was placed.  Discussed ankle bracing temporarily to see if this will help and may need to consider AFO long-term.  We discussed possibly an MRI but due to this is more nerve related. -Patient encouraged to call the office with any questions, concerns, change in symptoms.   Vivi Barrack DPM

## 2023-04-08 DIAGNOSIS — E785 Hyperlipidemia, unspecified: Secondary | ICD-10-CM | POA: Diagnosis not present

## 2023-04-08 DIAGNOSIS — E1122 Type 2 diabetes mellitus with diabetic chronic kidney disease: Secondary | ICD-10-CM | POA: Diagnosis not present

## 2023-04-08 DIAGNOSIS — K219 Gastro-esophageal reflux disease without esophagitis: Secondary | ICD-10-CM | POA: Diagnosis not present

## 2023-04-08 DIAGNOSIS — N1832 Chronic kidney disease, stage 3b: Secondary | ICD-10-CM | POA: Diagnosis not present

## 2023-04-08 DIAGNOSIS — I129 Hypertensive chronic kidney disease with stage 1 through stage 4 chronic kidney disease, or unspecified chronic kidney disease: Secondary | ICD-10-CM | POA: Diagnosis not present

## 2023-05-08 DIAGNOSIS — F411 Generalized anxiety disorder: Secondary | ICD-10-CM | POA: Diagnosis not present

## 2023-05-08 DIAGNOSIS — N1832 Chronic kidney disease, stage 3b: Secondary | ICD-10-CM | POA: Diagnosis not present

## 2023-05-08 DIAGNOSIS — I10 Essential (primary) hypertension: Secondary | ICD-10-CM | POA: Diagnosis not present

## 2023-05-08 DIAGNOSIS — G4700 Insomnia, unspecified: Secondary | ICD-10-CM | POA: Diagnosis not present

## 2023-06-02 ENCOUNTER — Encounter (HOSPITAL_COMMUNITY): Payer: Self-pay

## 2023-06-02 ENCOUNTER — Ambulatory Visit (HOSPITAL_COMMUNITY)
Admission: EM | Admit: 2023-06-02 | Discharge: 2023-06-02 | Disposition: A | Discharge status: Home / Self Care | Payer: Medicare HMO | Attending: Family Medicine | Admitting: Family Medicine

## 2023-06-02 DIAGNOSIS — B370 Candidal stomatitis: Secondary | ICD-10-CM

## 2023-06-02 DIAGNOSIS — I2782 Chronic pulmonary embolism: Secondary | ICD-10-CM

## 2023-06-02 MED ORDER — NYSTATIN 100000 UNIT/ML MT SUSP: 500000.0000 [IU] | Freq: Four times a day (QID) | OROMUCOSAL | 0 refills | Status: AC

## 2023-06-02 MED ORDER — ALBUTEROL SULFATE HFA 108 (90 BASE) MCG/ACT IN AERS: 2.0000 | INHALATION_SPRAY | RESPIRATORY_TRACT | 0 refills | Status: AC | PRN

## 2023-06-02 NOTE — ED Triage Notes (Signed)
Patient here today with c/o pain in the roof of her mouth since Friday. Patient wears a top denture. She is having pain with chewing and swallowing.

## 2023-06-02 NOTE — Discharge Instructions (Signed)
Nystatin--5 mL swish and spit 4 times daily for 7 days.

## 2023-06-02 NOTE — ED Provider Notes (Signed)
[facility]    CSN: [id number] Arrival date & time: 06/02/23  6295      History   Chief Complaint Chief Complaint  Patient presents with   Dental Pain    HPI [name] is a 72 y.o. female.    Dental Pain Here for pain in the roof of her mouth that began about August 1.  No fever or chills.  For about 1 week she has had some extra wheezing with her asthma and some cough.  She has not felt ill otherwise.  With the pain in the roof of her mouth she does sometimes have some irritation and tenderness when she swallows.    Past Medical History:  Diagnosis Date   Anxiety    Arthritis    Asthma    Cardiac arrest (HCC) 10/28/2021   Depression    Diabetes mellitus without complication (HCC)    Fibromyalgia    GERD (gastroesophageal reflux disease)    Hyperlipemia    Hypertension    PONV (postoperative nausea and vomiting)     Patient Active Problem List   Diagnosis Date Noted   History of cardiac arrest 10/04/2022   Malnutrition of moderate degree 06/05/2022   Acute lower GI bleeding 06/02/2022   GI bleed 02/18/2022   Bacteremia due to Enterococcus 02/15/2022   Goals of care, counseling/discussion 02/15/2022   Pain due to onychomycosis of toenails of both feet 02/06/2022   Adult failure to thrive syndrome 01/22/2022   History of anemia due to chronic kidney disease 01/17/2022   Neurocognitive deficits 01/17/2022   Pressure injury of skin 01/11/2022   History of pulmonary embolism 01/10/2022   Hypoglycemia 01/09/2022   CKD (chronic kidney disease) stage 4, GFR 15-29 ml/min (HCC) 01/09/2022   Syncope 01/09/2022   Essential hypertension    Debility 12/04/2021   Anoxia of brain (HCC) 12/04/2021   Acute on chronic renal failure (HCC) 11/12/2021   Leukocytosis    Thrombocytopenia (HCC)    Aspiration pneumonia of both lower lobes (HCC)    Hemodialysis-associated hypotension    Acute respiratory failure (HCC) due to recurrent aspiration Pneumonia and  mucous plugging/atelectasis     Acute saddle pulmonary embolism with acute cor pulmonale (HCC)    Protein-calorie malnutrition, severe 11/08/2021   Cardiac arrest (HCC) 10/28/2021   Adjustment disorder with depressed mood 04/10/2021   History of infectious disease 04/10/2021   Affective psychosis (HCC) 11/21/2020   Allergic rhinitis due to pollen 11/21/2020   Anxiety 11/21/2020   Chronic kidney disease, stage 2 (mild) 11/21/2020   Diabetic renal disease (HCC) 11/21/2020   Diverticular disease of colon 11/21/2020   Eczema 11/21/2020   Gastroesophageal reflux disease 11/21/2020   Gout 11/21/2020   Joint pain 11/21/2020   Lymphedema 11/21/2020   Mild intermittent asthma 11/21/2020   Moderate major depression, single episode (HCC) 11/21/2020   Neuropathy 11/21/2020   Type 2 diabetes mellitus without complication, with long-term current use of insulin (HCC) 11/21/2020   Pure hypercholesterolemia 11/21/2020   Vitamin D deficiency 11/21/2020   Fibromyalgia 09/04/2017   Insomnia 09/04/2017   DDD (degenerative disc disease), cervical 09/04/2017   DDD (degenerative disc disease), lumbar 09/04/2017   Primary osteoarthritis of both hands 09/04/2017   Primary osteoarthritis of both knees 09/04/2017   History of rotator cuff tear 09/04/2017   Osteopenia of multiple sites 09/04/2017    Past Surgical History:  Procedure Laterality Date   COLONOSCOPY     COLONOSCOPY WITH PROPOFOL N/A 02/20/2022   Procedure:  COLONOSCOPY WITH PROPOFOL;  Surgeon: Kathi Der, MD;  Location: [facility];  Service: Gastroenterology;  Laterality: N/A;   COLONOSCOPY WITH PROPOFOL N/A 06/06/2022   Procedure: COLONOSCOPY WITH PROPOFOL;  Surgeon: Willis Modena, MD;  Location: [facility] END[facility]COPY;  Service: Gastroenterology;  Laterality: N/A;   DILATION AND CURETTAGE OF UTERUS     ESOPHAGOGASTRODUODENOSCOPY N/A 02/20/2022   Procedure: ESOPHAGOGASTRODUODENOSCOPY (EGD);  Surgeon: Kathi Der, MD;  Location: [facility]  ENDOSCOPY;  Service: Gastroenterology;  Laterality: N/A;   IR EMBO ART  VEN HEMORR LYMPH EXTRAV  INC GUIDE ROADMAPPING  11/01/2021   IR FLUORO GUIDE CV LINE RIGHT  11/20/2021   IR IVC FILTER PLMT / S&I /IMG GUID/MOD SED  06/08/2022   IR REMOVAL TUN CV CATH W/O FL  12/20/2021   IR THROMBECT PRIM MECH INIT (INCLU) MOD SED  10/29/2021   IR US GUIDE VASC ACCESS RIGHT  10/29/2021   IR US GUIDE VASC ACCESS RIGHT  11/01/2021   IR US GUIDE VASC ACCESS RIGHT  11/21/2021   KNEE ARTHROSCOPY  [id number]   left   POLYPECTOMY  02/20/2022   Procedure: POLYPECTOMY;  Surgeon: Kathi Der, MD;  Location: [facility];  Service: Gastroenterology;;   SHOULDER ARTHROSCOPY     left   TRIGGER FINGER RELEASE  08/13/2012   Procedure: RELEASE TRIGGER FINGER/A-1 PULLEY;  Surgeon: Wyn Forster., MD;  Location: [facility];  Service: Orthopedics;  Laterality: Right;  long finger   TUBAL LIGATION      OB History   No obstetric history on file.      Home Medications    Prior to Admission medications   Medication Sig Start Date End Date Taking? Authorizing Provider  nystatin (MYCOSTATIN) 100000 UNIT/ML suspension Take 5 mLs (500,000 Units total) by mouth 4 (four) times daily for 7 days. Swish and spit 06/02/23 06/09/23 Yes , Janace Aris, MD  acetaminophen (TYLENOL) 325 MG tablet Take 2 tablets (650 mg total) by mouth every 4 (four) hours as needed for fever (greater than 37.6 C). Patient taking differently: Take 650 mg by mouth daily as needed (pain). 01/07/22   Angiulli, Mcarthur Rossetti, PA-C  albuterol (VENTOLIN HFA) 108 (90 Base) MCG/ACT inhaler Inhale 1-2 puffs into the lungs every 4 (four) hours as needed for wheezing or shortness of breath. Patient taking differently: Inhale 2 puffs into the lungs daily as needed for wheezing or shortness of breath. 02/14/22   Medina-Vargas, Monina C, NP  ALPRAZolam (XANAX) 0.25 MG tablet Take 0.25 mg by mouth daily as needed. 09/13/22   [provider]   amLODipine (NORVASC) 10 MG tablet Take 5 mg by mouth daily. 03/06/22   [provider]  atorvastatin (LIPITOR) 20 MG tablet Take 1 tablet (20 mg total) by mouth daily. 02/14/22   Medina-Vargas, Monina C, NP  B Complex-C (B-COMPLEX WITH VITAMIN C) tablet Take 1 tablet by mouth daily. 01/08/22   Angiulli, Mcarthur Rossetti, PA-C  dicyclomine (BENTYL) 10 MG capsule Take 1 capsule (10 mg total) by mouth 3 (three) times daily before meals. Patient taking differently: Take 10 mg by mouth 2 (two) times daily as needed (stomach cramps). 02/14/22   Medina-Vargas, Monina C, NP  FARXIGA 10 MG TABS tablet Take 10 mg by mouth daily. 09/27/22   [provider]  Ferrous Sulfate (IRON PO) Take 1 tablet by mouth daily.    [provider]  Glucerna (GLUCERNA) LIQD Take 237 mLs by mouth 2 (two) times daily between meals.    [provider]  LANTUS SOLOSTAR 100 UNIT/ML Solostar Pen Inject 20 Units into the skin daily. 09/27/22   [provider]  lidocaine (LIDODERM) 5 % Place 2 patches onto the skin daily. Remove & Discard patch within 12 hours or as directed by MD Patient taking differently: Place 1 patch onto the skin daily as needed (pain). Remove & Discard patch within 12 hours or as directed by MD 02/14/22   Medina-Vargas, Monina C, NP  pantoprazole (PROTONIX) 40 MG tablet Take 1 tablet (40 mg total) by mouth 2 (two) times daily. 02/14/22   Medina-Vargas, Monina C, NP  temazepam (RESTORIL) 7.5 MG capsule Take 1 capsule (7.5 mg total) by mouth at bedtime as needed for sleep. 03/20/22   Josph Macho, MD  Vitamin D, Ergocalciferol, (DRISDOL) 1.25 MG (50000 UNIT) CAPS capsule Take 1 capsule (50,000 Units total) by mouth once a week. 01/07/22   Angiulli, Mcarthur Rossetti, PA-C    Family History Family History  Problem Relation Age of Onset   Diabetes Mother    Heart attack Father    Diabetes Sister    Throat cancer Brother    Hypertension Daughter     Social History Social History    Tobacco Use   Smoking status: Former    Current packs/day: 0.00    Average packs/day: 0.5 packs/day for 20.0 years (10.0 ttl pk-yrs)    Types: Cigarettes    Start date: 08/10/1990    Quit date: 08/10/2010    Years since quitting: 12.8   Smokeless tobacco: Never  Vaping Use   Vaping status: Never Used  Substance Use Topics   Alcohol use: No   Drug use: No     Allergies   Darvon [propoxyphene], Hydrocodone bit-homatrop mbr, Metformin, Pentazocine, Percocet [oxycodone-acetaminophen], Sulfa antibiotics, Tetracaine hcl, Tetracyclines & related, Tramadol, Benadryl [diphenhydramine], Other, and Aspirin   Review of Systems Review of Systems   Physical Exam Triage Vital Signs ED Triage Vitals  Encounter Vitals Group     BP 06/02/23 1103 (!) 156/81     Systolic BP Percentile --      Diastolic BP Percentile --      Pulse Rate 06/02/23 1103 64     Resp --      Temp 06/02/23 1103 98.5 F (36.9 C)     Temp Source 06/02/23 1103 Oral     SpO2 06/02/23 1103 94 %     Weight 06/02/23 1122 155 lb (70.3 kg)     Height 06/02/23 1122 5\' 5"  (1.651 m)     Head Circumference --      Peak Flow --      Pain Score 06/02/23 1120 7     Pain Loc --      Pain Education --      Exclude from Growth Chart --    No data found.  Updated Vital Signs BP (!) 156/81 (BP Location: Right Arm)   Pulse 64   Temp 98.5 F (36.9 C) (Oral)   Ht 5\' 5"  (1.651 m)   Wt 70.3 kg   SpO2 94%   BMI 25.79 kg/m   Visual Acuity Right Eye Distance:   Left Eye Distance:   Bilateral Distance:    Right Eye Near:   Left Eye Near:    Bilateral Near:     Physical Exam Vitals reviewed.  Constitutional:      General: She is not in acute distress.    Appearance: She is not ill-appearing, toxic-appearing or diaphoretic.  HENT:  Mouth/Throat:     Mouth: Mucous membranes are moist.     Pharynx: No oropharyngeal exudate or posterior oropharyngeal erythema.     Comments: There is some very mild streaking  erythema there of the soft palate.  There is 1 area that is a little white.  There is no ulceration or swelling. Eyes:     Extraocular Movements: Extraocular movements intact.     Pupils: Pupils are equal, round, and reactive to light.  Cardiovascular:     Rate and Rhythm: Normal rate and regular rhythm.     Heart sounds: No murmur heard. Pulmonary:     Effort: Pulmonary effort is normal. No respiratory distress.     Breath sounds: Normal breath sounds. No stridor. No wheezing, rhonchi or rales.  Skin:    Coloration: Skin is not pale.  Neurological:     General: No focal deficit present.     Mental Status: She is alert and oriented to person, place, and time.  Psychiatric:        Behavior: Behavior normal.      UC Treatments / Results  Labs (all labs ordered are listed, but only abnormal results are displayed) Labs Reviewed - No data to display  EKG   Radiology No results found.  Procedures Procedures (including critical care time)  Medications Ordered in UC Medications - No data to display  Initial Impression / Assessment and Plan / UC Course  I have reviewed the triage vital signs and the nursing notes.  Pertinent labs & imaging results that were available during my care of the patient were reviewed by me and considered in my medical decision making (see chart for details).        I think this might be thrush.  Nystatin swish and spit is sent in to treat.  She will follow-up with her primary care Final Clinical Impressions(s) / UC Diagnoses   Final diagnoses:  Oral thrush     Discharge Instructions      Nystatin--5 mL swish and spit 4 times daily for 7 days.     ED Prescriptions     Medication Sig Dispense Auth. Provider   nystatin (MYCOSTATIN) 100000 UNIT/ML suspension Take 5 mLs (500,000 Units total) by mouth 4 (four) times daily for 7 days. Swish and spit 60 mL , Janace Aris, MD      I have reviewed the PDMP during this encounter.    Zenia Resides, MD 06/02/23 [phone]

## 2023-06-28 DIAGNOSIS — J208 Acute bronchitis due to other specified organisms: Secondary | ICD-10-CM | POA: Diagnosis not present

## 2023-06-28 DIAGNOSIS — B9689 Other specified bacterial agents as the cause of diseases classified elsewhere: Secondary | ICD-10-CM | POA: Diagnosis not present

## 2023-07-04 ENCOUNTER — Encounter: Payer: Self-pay | Admitting: Podiatry

## 2023-07-04 ENCOUNTER — Ambulatory Visit (INDEPENDENT_AMBULATORY_CARE_PROVIDER_SITE_OTHER): Payer: Medicare HMO | Admitting: Podiatry

## 2023-07-04 DIAGNOSIS — M79675 Pain in left toe(s): Secondary | ICD-10-CM | POA: Diagnosis not present

## 2023-07-04 DIAGNOSIS — E1169 Type 2 diabetes mellitus with other specified complication: Secondary | ICD-10-CM | POA: Diagnosis not present

## 2023-07-04 DIAGNOSIS — M79674 Pain in right toe(s): Secondary | ICD-10-CM

## 2023-07-04 DIAGNOSIS — B351 Tinea unguium: Secondary | ICD-10-CM | POA: Diagnosis not present

## 2023-07-04 DIAGNOSIS — Z794 Long term (current) use of insulin: Secondary | ICD-10-CM

## 2023-07-05 NOTE — Progress Notes (Signed)
Subjective: Chief Complaint  Patient presents with   Debridement    Trim toenails/calluses-diabetic - last A34c 28.66    72 year old female presents the office with above concerns.  She presents today for thick, long, elongated nails that she is not able to trim her self.  She does wear a brace for dropfoot on the right side.  Objective: AAO x3, NAD DP/PT pulses palpable bilaterally, CRT less than 3 seconds Sensation decreased with Semmes Weinstein monofilament. Nails are hypertrophic, dystrophic, brittle, discolored, elongated 10. No surrounding redness or drainage. Tenderness nails 1-5 bilaterally.  Minimal hyperkeratotic tissue noted left foot submetatarsal 3. . Decreased dorsiflexion of the right ankle.  There is significant pain in the ankle. No open lesions. No pain with calf compression, swelling, warmth, erythema  Assessment: Symptomatic onychomycosis; drop foot right  Plan: -All treatment options discussed with the patient including all alternatives, risks, complications.  -Sharply debrided nails x 10  without any complications or bleeding. -Patient encouraged to call the office with any questions, concerns, change in symptoms.   Vivi Barrack DPM

## 2023-07-09 DIAGNOSIS — R6889 Other general symptoms and signs: Secondary | ICD-10-CM | POA: Diagnosis not present

## 2023-08-01 ENCOUNTER — Ambulatory Visit
Admission: RE | Admit: 2023-08-01 | Discharge: 2023-08-01 | Disposition: A | Discharge status: Home / Self Care | Payer: Medicare HMO | Source: Ambulatory Visit | Attending: Internal Medicine | Admitting: Internal Medicine

## 2023-08-01 ENCOUNTER — Other Ambulatory Visit: Payer: Self-pay | Admitting: Internal Medicine

## 2023-08-01 DIAGNOSIS — J069 Acute upper respiratory infection, unspecified: Secondary | ICD-10-CM | POA: Diagnosis not present

## 2023-08-01 DIAGNOSIS — R052 Subacute cough: Secondary | ICD-10-CM | POA: Diagnosis not present

## 2023-08-01 DIAGNOSIS — R059 Cough, unspecified: Secondary | ICD-10-CM | POA: Diagnosis not present

## 2023-08-14 DIAGNOSIS — E7800 Pure hypercholesterolemia, unspecified: Secondary | ICD-10-CM | POA: Diagnosis not present

## 2023-08-14 DIAGNOSIS — J4520 Mild intermittent asthma, uncomplicated: Secondary | ICD-10-CM | POA: Diagnosis not present

## 2023-08-14 DIAGNOSIS — N184 Chronic kidney disease, stage 4 (severe): Secondary | ICD-10-CM | POA: Diagnosis not present

## 2023-08-14 DIAGNOSIS — Z0000 Encounter for general adult medical examination without abnormal findings: Secondary | ICD-10-CM | POA: Diagnosis not present

## 2023-08-14 DIAGNOSIS — E1142 Type 2 diabetes mellitus with diabetic polyneuropathy: Secondary | ICD-10-CM | POA: Diagnosis not present

## 2023-08-14 DIAGNOSIS — I10 Essential (primary) hypertension: Secondary | ICD-10-CM | POA: Diagnosis not present

## 2023-08-14 DIAGNOSIS — E1122 Type 2 diabetes mellitus with diabetic chronic kidney disease: Secondary | ICD-10-CM | POA: Diagnosis not present

## 2023-08-14 DIAGNOSIS — E1121 Type 2 diabetes mellitus with diabetic nephropathy: Secondary | ICD-10-CM | POA: Diagnosis not present

## 2023-08-14 DIAGNOSIS — I5030 Unspecified diastolic (congestive) heart failure: Secondary | ICD-10-CM | POA: Diagnosis not present

## 2023-08-14 DIAGNOSIS — R6889 Other general symptoms and signs: Secondary | ICD-10-CM | POA: Diagnosis not present

## 2023-08-14 DIAGNOSIS — M109 Gout, unspecified: Secondary | ICD-10-CM | POA: Diagnosis not present

## 2023-08-20 ENCOUNTER — Ambulatory Visit: Payer: Self-pay | Admitting: Cardiology

## 2023-08-20 DIAGNOSIS — R6889 Other general symptoms and signs: Secondary | ICD-10-CM | POA: Diagnosis not present

## 2023-09-22 DIAGNOSIS — N1832 Chronic kidney disease, stage 3b: Secondary | ICD-10-CM | POA: Diagnosis not present

## 2023-09-29 DIAGNOSIS — E1122 Type 2 diabetes mellitus with diabetic chronic kidney disease: Secondary | ICD-10-CM | POA: Diagnosis not present

## 2023-09-29 DIAGNOSIS — N1832 Chronic kidney disease, stage 3b: Secondary | ICD-10-CM | POA: Diagnosis not present

## 2023-09-29 DIAGNOSIS — I129 Hypertensive chronic kidney disease with stage 1 through stage 4 chronic kidney disease, or unspecified chronic kidney disease: Secondary | ICD-10-CM | POA: Diagnosis not present

## 2023-09-29 DIAGNOSIS — Z86711 Personal history of pulmonary embolism: Secondary | ICD-10-CM | POA: Diagnosis not present

## 2023-09-30 ENCOUNTER — Ambulatory Visit (INDEPENDENT_AMBULATORY_CARE_PROVIDER_SITE_OTHER): Payer: Medicare HMO | Admitting: Podiatry

## 2023-09-30 ENCOUNTER — Encounter: Payer: Self-pay | Admitting: Podiatry

## 2023-09-30 DIAGNOSIS — B351 Tinea unguium: Secondary | ICD-10-CM | POA: Diagnosis not present

## 2023-09-30 DIAGNOSIS — E1142 Type 2 diabetes mellitus with diabetic polyneuropathy: Secondary | ICD-10-CM

## 2023-09-30 DIAGNOSIS — Q828 Other specified congenital malformations of skin: Secondary | ICD-10-CM | POA: Diagnosis not present

## 2023-09-30 DIAGNOSIS — M79675 Pain in left toe(s): Secondary | ICD-10-CM

## 2023-09-30 DIAGNOSIS — M79674 Pain in right toe(s): Secondary | ICD-10-CM | POA: Diagnosis not present

## 2023-09-30 NOTE — Patient Instructions (Signed)
  Choose a moisturizer from  the list below:  For normal skin: Moisturize feet once daily; do not apply between toes A.  CeraVe Daily Moisturizing Lotion B.  Lubriderm Advanced Therapy Lotion or Lubriderm Intense Skin Repair Lotion C.  Aquaphor Intensive Repair Lotion D.  Gold Bond Ultimate Diabetic Foot Lotion E.  Eucerin Intensive Repair Moisturizing Lotion  For extremely dry, cracked feet: moisturize feet once daily; do not apply between toes A. CeraVe Healing Ointment B. Eucerin Aquaphor Repairing Ointment (may be labeled Aquaphor Healing Ointment) C. Vaseline Petroleum Healing Jelly   If you have problems reaching your feet: apply to feet once daily; do not apply between toes A.  Eucerin Aquaphor Ointment Body Spray  B.  Vaseline Intensive Care Spray Moisturizer (Unscented,  Cocoa Radiant Spray or Aloe Smooth Spray)

## 2023-10-01 NOTE — Progress Notes (Signed)
Subjective: Chief Complaint  Patient presents with   [facility]    Rm#[id number]    72 year old female presents the office with above concerns.  She presents today for thick, long, elongated nails that she is not able to trim herself.  She does wear a brace for dropfoot on the right side. She has no new concerns today.   Objective: AAO x3, NAD DP/PT pulses palpable bilaterally, CRT less than 3 seconds Sensation decreased with Semmes Weinstein monofilament. Nails are hypertrophic, dystrophic, brittle, discolored, elongated 10. No surrounding redness or drainage. Tenderness nails 1-5 bilaterally.  Minimal hyperkeratotic tissue noted left foot submetatarsal 3. No underlying ulceration, drainage or any signs of infection. Dry skin present. Decreased dorsiflexion of the right ankle.  There is significant pain in the ankle. No open lesions bilaterally. No pain with calf compression, swelling, warmth, erythema  Assessment: Symptomatic onychomycosis; drop foot right  Plan: -All treatment options discussed with the patient including all alternatives, risks, complications.  -Sharply debrided nails x 10  without any complications or bleeding. -Sharply debrided hyperkeratotic tissue x 1 without any complications or bleeding. -Discussed moisturizers. -Continue bracing for dropfoot. -Daily foot inspection. -Patient encouraged to call the office with any questions, concerns, change in symptoms.   Vivi Barrack DPM

## 2023-10-02 DIAGNOSIS — Z794 Long term (current) use of insulin: Secondary | ICD-10-CM | POA: Diagnosis not present

## 2023-10-02 DIAGNOSIS — E1122 Type 2 diabetes mellitus with diabetic chronic kidney disease: Secondary | ICD-10-CM | POA: Diagnosis not present

## 2023-10-02 DIAGNOSIS — I129 Hypertensive chronic kidney disease with stage 1 through stage 4 chronic kidney disease, or unspecified chronic kidney disease: Secondary | ICD-10-CM | POA: Diagnosis not present

## 2023-10-02 DIAGNOSIS — E1142 Type 2 diabetes mellitus with diabetic polyneuropathy: Secondary | ICD-10-CM | POA: Diagnosis not present

## 2023-10-03 ENCOUNTER — Ambulatory Visit: Payer: Medicare HMO | Admitting: Podiatry

## 2023-10-23 NOTE — Progress Notes (Unsigned)
Cardiology Office Note:  .   Date:  10/25/2023  ID:  [name], DOB 12-08-1950, MRN [id number] PCP: Georgann Housekeeper, MD  [facility] Providers Cardiologist:  None   History of Present Illness: .   [name] is a 72 y.o. female patient with diabetes mellitus with stage 4 CKD, primary hypertension, hypercholesterolemia, fibromyalgia, with history of acute cor pulmonale presented with cardiac arrest on 10/28/2021 secondary to acute pulmonary embolism last seen by cardiology on 02/21/2023 presents here for routine 28-month office visit.  Discussed the use of AI scribe software for clinical note transcription with the patient, who gave verbal consent to proceed.  History of Present Illness   The patient, with a history of blood clots in the lungs and legs, presents with concerns about the filter placed due to a bleeding complication from blood thinners. The patient inquires about the duration of the filter's presence and the implications of its removal. The patient experienced a significant health crisis last year, including near-death experiences due to blood clots. The patient's blood clot in the lungs occurred in January, with no known precipitating factors such as surgery or travel. The patient denies any family history of blood clots. The patient also developed a bleeding complication from the blood thinner Eliquis, with GI Bleed which was subsequently stopped. She was concurrently on ASA at that time as well, no NSAID use. The patient also mentions a recent issue with left leg swelling, which was managed with high compression stockings. The patient is independent and mobile, with no current concerns or issues to address.   Review of Systems  Cardiovascular:  Positive for leg swelling (occasional edema left leg). Negative for chest pain and dyspnea on exertion.   Labs   Lab Results  Component Value Date   NA 140 07/14/2022   K 3.8 07/14/2022   CO2 19 (L) 07/14/2022   GLUCOSE  166 (H) 07/14/2022   BUN 28 (H) 07/14/2022   CREATININE 1.74 (H) 07/14/2022   CALCIUM 9.4 07/14/2022   GFRNONAA 31 (L) 07/14/2022      Latest Ref Rng & Units 07/14/2022   12:55 PM 06/09/2022    1:44 AM 06/08/2022    2:18 AM  BMP  Glucose 70 - 99 mg/dL 829  562  130   BUN 8 - 23 mg/dL 28  13  12    Creatinine 0.44 - 1.00 mg/dL 8.65  7.84  6.96   Sodium 135 - 145 mmol/L 140  142  142   Potassium 3.5 - 5.1 mmol/L 3.8  3.5  3.4   Chloride 98 - 111 mmol/L 111  107  111   CO2 22 - 32 mmol/L 19  28  27    Calcium 8.9 - 10.3 mg/dL 9.4  7.9  7.7       Latest Ref Rng & Units 07/14/2022   12:55 PM 06/08/2022   10:48 AM 06/06/2022    4:43 PM  CBC  WBC 4.0 - 10.5 K/uL 8.2  8.2  10.6   Hemoglobin 12.0 - 15.0 g/dL 29.5  28.4  13.2   Hematocrit 36.0 - 46.0 % 34.9  31.0  32.4   Platelets 150 - 400 K/uL 301  173  160    External Labs:  NA  Physical Exam:   VS:  BP 126/70 (BP Location: Left Arm, Patient Position: Sitting, Cuff Size: Normal)   Pulse 67   Resp 16   Ht 5\' 5"  (1.651 m)   Wt 153 lb 9.6  oz (69.7 kg)   SpO2 98%   BMI 25.56 kg/m    Wt Readings from Last 3 Encounters:  10/24/23 153 lb 9.6 oz (69.7 kg)  06/02/23 155 lb (70.3 kg)  02/21/23 168 lb (76.2 kg)     Physical Exam Neck:     Vascular: No carotid bruit or JVD.  Cardiovascular:     Rate and Rhythm: Normal rate and regular rhythm.     Pulses: Intact distal pulses.     Heart sounds: Normal heart sounds. No murmur heard.    No gallop.  Pulmonary:     Effort: Pulmonary effort is normal.     Breath sounds: Normal breath sounds.  Abdominal:     General: Bowel sounds are normal.     Palpations: Abdomen is soft.  Musculoskeletal:     Right lower leg: No edema.     Left lower leg: No edema.    Studies Reviewed: Marland Kitchen    ECHOCARDIOGRAM COMPLETE 01/10/2022  1. Left ventricular ejection fraction, by estimation, is 60 to 65%. The left ventricle has normal function. The left ventricle has no regional wall motion abnormalities.  Left ventricular diastolic parameters are consistent with Grade I diastolic dysfunction (impaired relaxation). 2. Right ventricular systolic function is normal. The right ventricular size is normal. 3. The mitral valve is normal in structure. No evidence of mitral valve regurgitation. No evidence of mitral stenosis. 4. The aortic valve is tricuspid. Aortic valve regurgitation is not visualized. No aortic stenosis is present. 5.  Compared to 10/28/2021, findings consistent with acute pulmonary embolism with RV strain no longer present, RV function previously moderately depressed now normal.  Modified Bruce exercise Tetrofosmin stress test 10/07/2022: No previous exam available for comparison. Modified Bruce nuclear stress test performed using 1-day protocol. Patient achieved 2.9 METS and reached 88% MPHR. No chest pain reported. Heart rate and hemodynamic response were normal. Stress EKG revealed no ischemic changes. Normal myocardial perfusion. Stress LVEF 71%. Low risk study.  IVC filter 06/09/2023: 1. ULTRASOUND GUIDANCE FOR VASCULAR ACCESS OF THE RIGHT INTERNAL JUGULAR VEIN. 2. IVC VENOGRAM. 3. PERCUTANEOUS IVC FILTER (Bard Denali IVC filter)  PLACEMENT.   EKG:    EKG Interpretation Date/Time:  Friday October 24 2023 10:42:37 EST Ventricular Rate:  67 PR Interval:  106 QRS Duration:  86 QT Interval:  404 QTC Calculation: 426 R Axis:   -6  Text Interpretation: EKG 10/24/2023: Normal sinus rhythm at rate of 67 bpm, leftward axis otherwise normal EKG.  No significant change from 07/14/2022 (slight axis shift nonspecific). Confirmed by Delrae Rend [phone]) on 10/24/2023 10:58:39 AM    Medications and allergies    Allergies  Allergen Reactions   Darvon [Propoxyphene] Itching   Hydrocodone Bit-Homatrop Mbr Itching   Metformin Nausea And Vomiting   Pentazocine Other (See Comments)      jitters   Percocet [Oxycodone-Acetaminophen] Itching   Sulfa Antibiotics Hives    Tetracaine Hcl Itching   Tetracyclines & Related Hives   Tramadol Itching   Benadryl [Diphenhydramine] Itching and Other (See Comments)    Can only take dye free. States the dye causes itching.   Other     Other reaction(s): Unknown. States reaction was to nasal spray that caused pupils to shrink   Aspirin Nausea And Vomiting     Current Outpatient Medications:    acetaminophen (TYLENOL) 325 MG tablet, Take 2 tablets (650 mg total) by mouth every 4 (four) hours as needed for fever (greater than 37.6 C). (Patient taking differently:  Take 650 mg by mouth daily as needed (pain).), Disp: , Rfl:    albuterol (VENTOLIN HFA) 108 (90 Base) MCG/ACT inhaler, Inhale 2 puffs into the lungs every 4 (four) hours as needed for wheezing or shortness of breath., Disp: 8.5 g, Rfl: 0   ALPRAZolam (XANAX) 0.25 MG tablet, Take 0.25 mg by mouth daily as needed., Disp: , Rfl:    amLODipine (NORVASC) 10 MG tablet, Take 5 mg by mouth daily., Disp: , Rfl:    atorvastatin (LIPITOR) 20 MG tablet, Take 1 tablet (20 mg total) by mouth daily., Disp: 30 tablet, Rfl: 0   B Complex-C (B-COMPLEX WITH VITAMIN C) tablet, Take 1 tablet by mouth daily., Disp: 30 tablet, Rfl: 0   dicyclomine (BENTYL) 10 MG capsule, Take 1 capsule (10 mg total) by mouth 3 (three) times daily before meals. (Patient taking differently: Take 10 mg by mouth 2 (two) times daily as needed (stomach cramps).), Disp: 90 capsule, Rfl: 0   escitalopram (LEXAPRO) 5 MG tablet, Take 5 mg by mouth daily., Disp: , Rfl:    FARXIGA 10 MG TABS tablet, Take 10 mg by mouth daily., Disp: , Rfl:    Ferrous Sulfate (IRON PO), Take 1 tablet by mouth daily., Disp: , Rfl:    gabapentin (NEURONTIN) 100 MG capsule, Take 100 mg by mouth 2 (two) times daily., Disp: , Rfl:    Glucerna (GLUCERNA) LIQD, Take 237 mLs by mouth 2 (two) times daily between meals., Disp: , Rfl:    LANTUS SOLOSTAR 100 UNIT/ML Solostar Pen, Inject 20 Units into the skin daily., Disp: , Rfl:    lidocaine  (LIDODERM) 5 %, Place 2 patches onto the skin daily. Remove & Discard patch within 12 hours or as directed by MD (Patient taking differently: Place 1 patch onto the skin daily as needed (pain). Remove & Discard patch within 12 hours or as directed by MD), Disp: 60 patch, Rfl: 0   metoprolol succinate (TOPROL-XL) 25 MG 24 hr tablet, Take by mouth., Disp: , Rfl:    pantoprazole (PROTONIX) 40 MG tablet, Take 1 tablet (40 mg total) by mouth 2 (two) times daily., Disp: 60 tablet, Rfl: 0   temazepam (RESTORIL) 7.5 MG capsule, Take 1 capsule (7.5 mg total) by mouth at bedtime as needed for sleep., Disp: 30 capsule, Rfl: 0   TRUEplus Lancets 33G MISC, , Disp: , Rfl:    Vitamin D, Ergocalciferol, (DRISDOL) 1.25 MG (50000 UNIT) CAPS capsule, Take 1 capsule (50,000 Units total) by mouth once a week., Disp: 5 capsule, Rfl: 0   ASSESSMENT AND PLAN: .      ICD-10-CM   1. Essential hypertension  I10 EKG 12-Lead    2. History of pulmonary embolism  Z86.711 Ambulatory referral to Hematology / Oncology    3. Chronic deep vein thrombosis (DVT) of femoral vein of left lower extremity (HCC)  I82.512 Ambulatory referral to Hematology / Oncology    4. S/P insertion of IVC (inferior vena caval) filter Bard Denali filter 06/09/23)  Z95.828      Assessment and Plan    Pulmonary Embolism (PE) and Deep Vein Thrombosis (DVT) History of PE and DVT with an IVC filter placement due to bleeding complications from anticoagulation therapy. Currently not on any anticoagulation. Unclear etiology of initial blood clot. Although one-time episode of PE and DVT, in view of life-threatening pulmonary embolism, should be considered low-dose Eliquis for DVT prophylaxis, patient has persistent left leg chronic DVT by recent ultrasound.  Fortunately no postphlebitic syndrome.  It appears patient was also on aspirin when she has had GI bleed with concurrent treatment with Eliquis.  Also reduced dose of Eliquis at 2.5 mg twice daily with DVT  prophylaxis dose may be adequate to hopefully prevent DVT and risk of PE.  She does have IVC filter in situ, potential for thrombus formation, IVC syndrome, perforation not to be ignored hence may need to be retrieved.  -Refer to Hematology for evaluation of potential low-dose anticoagulation therapy and discussion regarding IVC filter removal.  Patient has seen Dr. Myna Hidalgo. -Plan for follow-up after Hematology consultation to discuss potential IVC filter removal to close the loop.  If decision is made to leave the IVC filter in situ and no further anticoagulation needed, then I will see her back on a as needed basis.  Hypertension Well controlled on current medication regimen (Amlodipine, Metoprolol). -Continue current medication regimen.  Hyperlipidemia Well controlled on Atorvastatin. -Continue Atorvastatin.  Chronic Kidney Disease No current issues reported. -Monitor kidney function. -She is aware not to use any NSAIDs.  Lower Extremity Edema History of left leg DVT and occasional swelling, especially after prolonged periods of standing or sitting. -Continue use of compression stockings, especially during periods of prolonged standing or sitting. -Elevate legs if swelling occurs.  General Health Maintenance / Followup Plans -Return for follow-up after Hematology consultation.     Signed,  Yates Decamp, MD, [facility] 10/25/2023, 5:32 AM [facility] [address] [place], [place] [address] Phone: [phone]. Fax:  [phone]

## 2023-10-24 ENCOUNTER — Encounter: Payer: Self-pay | Admitting: Cardiology

## 2023-10-24 ENCOUNTER — Ambulatory Visit: Payer: Medicare HMO | Attending: Cardiology | Admitting: Cardiology

## 2023-10-24 VITALS — BP 126/70 | HR 67 | Resp 16 | Ht 65.0 in | Wt 153.6 lb

## 2023-10-24 DIAGNOSIS — I10 Essential (primary) hypertension: Secondary | ICD-10-CM

## 2023-10-24 DIAGNOSIS — Z86711 Personal history of pulmonary embolism: Secondary | ICD-10-CM

## 2023-10-24 DIAGNOSIS — I82512 Chronic embolism and thrombosis of left femoral vein: Secondary | ICD-10-CM | POA: Diagnosis not present

## 2023-10-24 DIAGNOSIS — R600 Localized edema: Secondary | ICD-10-CM | POA: Diagnosis not present

## 2023-10-24 DIAGNOSIS — Z992 Dependence on renal dialysis: Secondary | ICD-10-CM

## 2023-10-24 DIAGNOSIS — Z95828 Presence of other vascular implants and grafts: Secondary | ICD-10-CM

## 2023-10-24 NOTE — Patient Instructions (Signed)
Testing/Procedures: Ambulatory referral to Hematology/Oncology Myna Hidalgo)  Follow-Up: At [facility], you and your health needs are our priority.  As part of our continuing mission to provide you with exceptional heart care, we have created designated Provider Care Teams.  These Care Teams include your primary Cardiologist (physician) and Advanced Practice Providers (APPs -  Physician Assistants and Nurse Practitioners) who all work together to provide you with the care you need, when you need it.   Your next appointment:   2 month(s)  Provider:   Sherilyn Banker, MD

## 2023-11-20 ENCOUNTER — Inpatient Hospital Stay: Payer: Medicare HMO

## 2023-11-20 ENCOUNTER — Ambulatory Visit: Payer: Medicare HMO | Admitting: Hematology & Oncology

## 2023-11-21 ENCOUNTER — Inpatient Hospital Stay: Payer: Medicare PPO | Attending: Hematology & Oncology

## 2023-11-21 ENCOUNTER — Encounter: Payer: Self-pay | Admitting: Hematology & Oncology

## 2023-11-21 ENCOUNTER — Inpatient Hospital Stay (HOSPITAL_BASED_OUTPATIENT_CLINIC_OR_DEPARTMENT_OTHER): Payer: Medicare PPO | Admitting: Hematology & Oncology

## 2023-11-21 VITALS — BP 116/58 | HR 74 | Temp 98.9°F | Resp 18 | Ht 65.0 in | Wt 155.0 lb

## 2023-11-21 DIAGNOSIS — Z79899 Other long term (current) drug therapy: Secondary | ICD-10-CM | POA: Diagnosis not present

## 2023-11-21 DIAGNOSIS — D6862 Lupus anticoagulant syndrome: Secondary | ICD-10-CM | POA: Insufficient documentation

## 2023-11-21 DIAGNOSIS — Z86711 Personal history of pulmonary embolism: Secondary | ICD-10-CM | POA: Insufficient documentation

## 2023-11-21 DIAGNOSIS — I469 Cardiac arrest, cause unspecified: Secondary | ICD-10-CM | POA: Insufficient documentation

## 2023-11-21 DIAGNOSIS — N184 Chronic kidney disease, stage 4 (severe): Secondary | ICD-10-CM

## 2023-11-21 DIAGNOSIS — N182 Chronic kidney disease, stage 2 (mild): Secondary | ICD-10-CM

## 2023-11-21 LAB — CBC WITH DIFFERENTIAL (CANCER CENTER ONLY)
Abs Immature Granulocytes: 0.01 10*3/uL (ref 0.00–0.07)
Basophils Absolute: 0.1 10*3/uL (ref 0.0–0.1)
Basophils Relative: 1 %
Eosinophils Absolute: 0.1 10*3/uL (ref 0.0–0.5)
Eosinophils Relative: 1 %
HCT: 41.5 % (ref 36.0–46.0)
Hemoglobin: 13.4 g/dL (ref 12.0–15.0)
Immature Granulocytes: 0 %
Lymphocytes Relative: 31 %
Lymphs Abs: 2.1 10*3/uL (ref 0.7–4.0)
MCH: 26.3 pg (ref 26.0–34.0)
MCHC: 32.3 g/dL (ref 30.0–36.0)
MCV: 81.5 fL (ref 80.0–100.0)
Monocytes Absolute: 0.6 10*3/uL (ref 0.1–1.0)
Monocytes Relative: 10 %
Neutro Abs: 3.9 10*3/uL (ref 1.7–7.7)
Neutrophils Relative %: 57 %
Platelet Count: 283 10*3/uL (ref 150–400)
RBC: 5.09 MIL/uL (ref 3.87–5.11)
RDW: 14.2 % (ref 11.5–15.5)
WBC Count: 6.8 10*3/uL (ref 4.0–10.5)
nRBC: 0 % (ref 0.0–0.2)

## 2023-11-21 LAB — CMP (CANCER CENTER ONLY)
ALT: 9 U/L (ref 0–44)
AST: 14 U/L — ABNORMAL LOW (ref 15–41)
Albumin: 4.1 g/dL (ref 3.5–5.0)
Alkaline Phosphatase: 102 U/L (ref 38–126)
Anion gap: 7 (ref 5–15)
BUN: 27 mg/dL — ABNORMAL HIGH (ref 8–23)
CO2: 26 mmol/L (ref 22–32)
Calcium: 8.9 mg/dL (ref 8.9–10.3)
Chloride: 108 mmol/L (ref 98–111)
Creatinine: 2.14 mg/dL — ABNORMAL HIGH (ref 0.44–1.00)
GFR, Estimated: 24 mL/min — ABNORMAL LOW (ref >60)
Glucose, Bld: 113 mg/dL — ABNORMAL HIGH (ref 70–99)
Potassium: 5 mmol/L (ref 3.5–5.1)
Sodium: 141 mmol/L (ref 135–145)
Total Bilirubin: 0.3 mg/dL (ref 0.0–1.2)
Total Protein: 6.7 g/dL (ref 6.5–8.1)

## 2023-11-21 LAB — LACTATE DEHYDROGENASE: LDH: 177 U/L (ref 98–192)

## 2023-11-21 NOTE — Progress Notes (Signed)
Hematology and Oncology Follow Up Visit  [name] [id number] [birth year] 73 y.o. 11/21/2023   Principle Diagnosis:  Saddle pulmonary embolism-possible lupus anticoagulant Anemia secondary to renal insufficiency Potential liver mass versus bleed  Current Therapy:   Eliquis 5 mg p.o. twice daily--DC in 05/2022 secondary to GI bleeding IVC filter placed-05/2022     Interim History:  [name] is back for a long awaited follow-up.  We last saw her back in June 2023.  After that, she had a GI bleed back in August.  She was taken off her Eliquis.  She was given an IVC filter.  She has had no problems with bleeding.  She has had no problems with leg pain or swelling.  She has had no issues with cough or shortness of breath.  She has had no change in bowel or bladder habits.  She has had no issues with headache.  As always, she has a very strong faith.  She has not noted any problems with COVID.  She is eating well.  There is been no nausea or vomiting.  She has had no rashes.  Overall, I would say that her performance status is probably ECOG 1.   Medications:  Current Outpatient Medications:    acetaminophen (TYLENOL) 325 MG tablet, Take 2 tablets (650 mg total) by mouth every 4 (four) hours as needed for fever (greater than 37.6 C). (Patient taking differently: Take 650 mg by mouth daily as needed (pain).), Disp: , Rfl:    albuterol (VENTOLIN HFA) 108 (90 Base) MCG/ACT inhaler, Inhale 2 puffs into the lungs every 4 (four) hours as needed for wheezing or shortness of breath., Disp: 8.5 g, Rfl: 0   ALPRAZolam (XANAX) 0.25 MG tablet, Take 0.25 mg by mouth daily as needed., Disp: , Rfl:    amLODipine (NORVASC) 10 MG tablet, Take 5 mg by mouth daily., Disp: , Rfl:    atorvastatin (LIPITOR) 20 MG tablet, Take 1 tablet (20 mg total) by mouth daily., Disp: 30 tablet, Rfl: 0   B Complex-C (B-COMPLEX WITH VITAMIN C) tablet, Take 1 tablet by mouth daily., Disp: 30 tablet, Rfl: 0    dicyclomine (BENTYL) 10 MG capsule, Take 1 capsule (10 mg total) by mouth 3 (three) times daily before meals. (Patient taking differently: Take 10 mg by mouth 2 (two) times daily as needed (stomach cramps).), Disp: 90 capsule, Rfl: 0   escitalopram (LEXAPRO) 5 MG tablet, Take 5 mg by mouth daily., Disp: , Rfl:    FARXIGA 10 MG TABS tablet, Take 10 mg by mouth daily., Disp: , Rfl:    Ferrous Sulfate (IRON PO), Take 1 tablet by mouth daily., Disp: , Rfl:    gabapentin (NEURONTIN) 100 MG capsule, Take 100 mg by mouth 2 (two) times daily., Disp: , Rfl:    Glucerna (GLUCERNA) LIQD, Take 237 mLs by mouth 2 (two) times daily between meals., Disp: , Rfl:    iron polysaccharides (NIFEREX) 150 MG capsule, Take 150 mg by mouth daily., Disp: , Rfl:    LANTUS SOLOSTAR 100 UNIT/ML Solostar Pen, Inject 20 Units into the skin daily., Disp: , Rfl:    lidocaine (LIDODERM) 5 %, Place 2 patches onto the skin daily. Remove & Discard patch within 12 hours or as directed by MD (Patient taking differently: Place 1 patch onto the skin daily as needed (pain). Remove & Discard patch within 12 hours or as directed by MD), Disp: 60 patch, Rfl: 0   metoprolol succinate (TOPROL-XL) 25 MG 24 hr  tablet, Take by mouth., Disp: , Rfl:    pantoprazole (PROTONIX) 40 MG tablet, Take 1 tablet (40 mg total) by mouth 2 (two) times daily., Disp: 60 tablet, Rfl: 0   SYMBICORT 160-4.5 MCG/ACT inhaler, Inhale 2 puffs into the lungs in the morning and at bedtime., Disp: , Rfl:    temazepam (RESTORIL) 7.5 MG capsule, Take 1 capsule (7.5 mg total) by mouth at bedtime as needed for sleep., Disp: 30 capsule, Rfl: 0   tiZANidine (ZANAFLEX) 4 MG tablet, Take 4 mg by mouth at bedtime as needed., Disp: , Rfl:    TRUE METRIX BLOOD GLUCOSE TEST test strip, 1 each by Other route as needed for other., Disp: , Rfl:    TRUEplus Lancets 33G MISC, , Disp: , Rfl:    Vitamin D, Ergocalciferol, (DRISDOL) 1.25 MG (50000 UNIT) CAPS capsule, Take 1 capsule (50,000  Units total) by mouth once a week., Disp: 5 capsule, Rfl: 0  Allergies:  Allergies  Allergen Reactions   Benadryl [Diphenhydramine] Itching and Other (See Comments)    Can only take dye free. States the dye causes itching.   Darvon [Propoxyphene] Itching   Hydrocodone Bit-Homatrop Mbr Itching   Metformin Nausea And Vomiting   Pentazocine Other (See Comments)      jitters   Percocet [Oxycodone-Acetaminophen] Itching   Sulfa Antibiotics Hives   Tetracaine Hcl Itching   Tetracyclines & Related Hives   Tramadol Itching   Aspirin Nausea And Vomiting   Other Other (See Comments)    Other reaction(s): Unknown. States reaction was to nasal spray that caused pupils to shrink    Past Medical History, Surgical history, Social history, and Family History were reviewed and updated.  Review of Systems: Review of Systems  Constitutional:  Positive for fatigue.  HENT:  Negative.    Eyes: Negative.   Respiratory: Negative.    Cardiovascular:  Positive for leg swelling.  Gastrointestinal: Negative.   Endocrine: Negative.   Genitourinary: Negative.    Musculoskeletal: Negative.   Skin: Negative.   Neurological: Negative.   Hematological: Negative.   Psychiatric/Behavioral: Negative.      Physical Exam:  height is 5\' 5"  (1.651 m) and weight is 155 lb (70.3 kg). Her oral temperature is 98.9 F (37.2 C). Her blood pressure is 116/58 (abnormal) and her pulse is 74. Her respiration is 18 and oxygen saturation is 100%.   Wt Readings from Last 3 Encounters:  11/21/23 155 lb (70.3 kg)  10/24/23 153 lb 9.6 oz (69.7 kg)  06/02/23 155 lb (70.3 kg)    Physical Exam Vitals reviewed.  HENT:     Head: Normocephalic and atraumatic.  Eyes:     Pupils: Pupils are equal, round, and reactive to light.  Cardiovascular:     Rate and Rhythm: Normal rate and regular rhythm.     Heart sounds: Normal heart sounds.  Pulmonary:     Effort: Pulmonary effort is normal.     Breath sounds: Normal breath  sounds.  Abdominal:     General: Bowel sounds are normal.     Palpations: Abdomen is soft.  Musculoskeletal:        General: No tenderness or deformity. Normal range of motion.     Cervical back: Normal range of motion.  Lymphadenopathy:     Cervical: No cervical adenopathy.  Skin:    General: Skin is warm and dry.     Findings: No erythema or rash.  Neurological:     Mental Status: She is alert and  oriented to person, place, and time.  Psychiatric:        Behavior: Behavior normal.        Thought Content: Thought content normal.        Judgment: Judgment normal.     Lab Results  Component Value Date   WBC 6.8 11/21/2023   HGB 13.4 11/21/2023   HCT 41.5 11/21/2023   MCV 81.5 11/21/2023   PLT 283 11/21/2023     Chemistry      Component Value Date/Time   NA 141 11/21/2023 1442   NA 139 01/23/2022 0000   K 5.0 11/21/2023 1442   CL 108 11/21/2023 1442   CO2 26 11/21/2023 1442   BUN 27 (H) 11/21/2023 1442   BUN 23 (A) 01/23/2022 0000   CREATININE 2.14 (H) 11/21/2023 1442   CREATININE 1.14 (H) 03/06/2017 1502   GLU 138 01/23/2022 0000      Component Value Date/Time   CALCIUM 8.9 11/21/2023 1442   ALKPHOS 102 11/21/2023 1442   AST 14 (L) 11/21/2023 1442   ALT 9 11/21/2023 1442   BILITOT 0.3 11/21/2023 1442      Impression and Plan: Ms. Holben is a very nice 73 year old Afro-American female.  She came in with a cardiac arrest.  She had a pulmonary embolism that was a saddle embolus.  She managed to get through this.  She had temporary renal failure.  She had temporary hepatic failure.  Thankfully, she was able to get off all of the supporting machines.  We found that she did have a lupus anticoagulant.  She is having this repeated again.  Unfortunately, I suspect that we are going need to have that IVC filter in permanently.  Again, she has had a lupus anticoagulant.  I realize this is transient.  However, whenever this develops, this certainly increases the risk of  thromboembolic disease.  I also worry that she still has the nonocclusive thrombus in the femoral vein.  I think she just had a thrombus in a vein below the knee, this would not nearly be as concerning for embolus up to the lungs.  She also had a severe saddle embolus when she presented.  Again, I really think she is going need to have this IVC filter and permanently since she is off oral anticoagulation.  I really do not see a problem with her having the filter in place.  I realize that there could be some problems in the future as clots can sometimes cause blockage of the filter.  We will see what her lupus anticoagulant panel shows.     I think we will go to have to follow her up more closely.  I probably would like to get her back in 6 months.  I realize that is somewhat of a journey to come out to see [name].  As always, we have had a good prayer at the end of our appointment today.      Josph Macho, MD 1/24/20253:50 PM

## 2023-11-22 LAB — CARDIOLIPIN ANTIBODIES, IGG, IGM, IGA
Anticardiolipin IgA: <9 [APL'U]/mL (ref 0–11)
Anticardiolipin IgG: <9 [GPL'U]/mL (ref 0–14)
Anticardiolipin IgM: 9 [MPL'U]/mL (ref 0–12)

## 2023-11-23 LAB — LUPUS ANTICOAGULANT PANEL
DRVVT: 35.6 s (ref 0.0–47.0)
PTT Lupus Anticoagulant: 30.4 s (ref 0.0–43.5)

## 2023-11-24 ENCOUNTER — Telehealth: Payer: Self-pay

## 2023-11-24 NOTE — Telephone Encounter (Signed)
Advised via MyChart.

## 2023-11-24 NOTE — Telephone Encounter (Signed)
-----   Message from Josph Macho sent at 11/24/2023  6:40 AM EST ----- Please call and let her know that the lupus anticoagulant test is negative.  This is wonderful.Marland Kitchen

## 2023-12-29 ENCOUNTER — Encounter: Payer: Self-pay | Admitting: Cardiology

## 2023-12-29 ENCOUNTER — Ambulatory Visit: Payer: Medicare PPO | Attending: Cardiology | Admitting: Cardiology

## 2023-12-29 VITALS — BP 122/68 | HR 71 | Resp 16 | Ht 65.0 in | Wt 150.4 lb

## 2023-12-29 DIAGNOSIS — Z95828 Presence of other vascular implants and grafts: Secondary | ICD-10-CM

## 2023-12-29 DIAGNOSIS — R2241 Localized swelling, mass and lump, right lower limb: Secondary | ICD-10-CM | POA: Diagnosis not present

## 2023-12-29 DIAGNOSIS — D6859 Other primary thrombophilia: Secondary | ICD-10-CM

## 2023-12-29 DIAGNOSIS — I82512 Chronic embolism and thrombosis of left femoral vein: Secondary | ICD-10-CM

## 2023-12-29 DIAGNOSIS — I82421 Acute embolism and thrombosis of right iliac vein: Secondary | ICD-10-CM | POA: Diagnosis not present

## 2023-12-29 NOTE — Patient Instructions (Signed)
 Medication Instructions:  Your physician recommends that you continue on your current medications as directed. Please refer to the Current Medication list given to you today.  *If you need a refill on your cardiac medications before your next appointment, please call your pharmacy*   Lab Work: none If you have labs (blood work) drawn today and your tests are completely normal, you will receive your results only by: MyChart Message (if you have MyChart) OR A paper copy in the mail If you have any lab test that is abnormal or we need to change your treatment, we will call you to review the results.   Testing/Procedures: Your physician has requested that you have a lower  extremity venous duplex as soon as possible. This test is an ultrasound of the veins in the legs It looks at venous blood flow that carries blood from the heart to the legs Allow one hour for a Lower Venous exam. There are no restrictions or special instructions.  Please note: We ask at that you not bring children with you during ultrasound (echo/ vascular) testing. Due to room size and safety concerns, children are not allowed in the ultrasound rooms during exams. Our front office staff cannot provide observation of children in our lobby area while testing is being conducted. An adult accompanying a patient to their appointment will only be allowed in the ultrasound room at the discretion of the ultrasound technician under special circumstances. We apologize for any inconvenience.    Follow-Up: At [facility], you and your health needs are our priority.  As part of our continuing mission to provide you with exceptional heart care, we have created designated Provider Care Teams.  These Care Teams include your primary Cardiologist (physician) and Advanced Practice Providers (APPs -  Physician Assistants and Nurse Practitioners) who all work together to provide you with the care you need, when you need it.  We recommend  signing up for the patient portal called "MyChart".  Sign up information is provided on this After Visit Summary.  MyChart is used to connect with patients for Virtual Visits (Telemedicine).  Patients are able to view lab/test results, encounter notes, upcoming appointments, etc.  Non-urgent messages can be sent to your provider as well.   To learn more about what you can do with MyChart, go to [email or web address].    Your next appointment:   3 month(s)  Provider:   Yates Decamp, MD     Other Instructions

## 2023-12-29 NOTE — Progress Notes (Signed)
 Cardiology Office Note:  .   Date:  12/29/2023  ID:  [name], DOB November 29, 1950, MRN [id number] PCP: Georgann Housekeeper, MD  [facility] Providers Cardiologist:  Yates Decamp, MD   History of Present Illness: .   [name] is a 73 y.o. female patient with diabetes mellitus with stage 4 CKD, primary hypertension, hypercholesterolemia, fibromyalgia, with history of acute cor pulmonale presented with cardiac arrest on 10/28/2021 secondary to acute pulmonary embolism was placed on Eliquis however developed life-threatening GI bleed and Eliquis was discontinued and IVC filter placed on 06/08/2022.    I had seen her in December 2024 for and referred her for hematology consultation to see whether she could be started on low-dose Eliquis 2.5 mg twice daily for DVT prophylaxis and consider IVC filter retrieval.  She now presents for follow-up.  Patient states that for the past 2 to 3 weeks she has noticed pain and swelling in her right lower extremity.  Otherwise she has no specific complaints.     Labs   Lab Results  Component Value Date   TRIG 154 (H) 11/15/2021   Lab Results  Component Value Date   NA 141 11/21/2023   K 5.0 11/21/2023   CO2 26 11/21/2023   GLUCOSE 113 (H) 11/21/2023   BUN 27 (H) 11/21/2023   CREATININE 2.14 (H) 11/21/2023   CALCIUM 8.9 11/21/2023   GFRNONAA 24 (L) 11/21/2023      Latest Ref Rng & Units 11/21/2023    2:42 PM 07/14/2022   12:55 PM 06/09/2022    1:44 AM  BMP  Glucose 70 - 99 mg/dL 324  401  027   BUN 8 - 23 mg/dL 27  28  13    Creatinine 0.44 - 1.00 mg/dL 2.53  6.64  4.03   Sodium 135 - 145 mmol/L 141  140  142   Potassium 3.5 - 5.1 mmol/L 5.0  3.8  3.5   Chloride 98 - 111 mmol/L 108  111  107   CO2 22 - 32 mmol/L 26  19  28    Calcium 8.9 - 10.3 mg/dL 8.9  9.4  7.9       Latest Ref Rng & Units 11/21/2023    2:42 PM 07/14/2022   12:55 PM 06/08/2022   10:48 AM  CBC  WBC 4.0 - 10.5 K/uL 6.8  8.2  8.2   Hemoglobin 12.0 - 15.0 g/dL 47.4  25.9   56.3   Hematocrit 36.0 - 46.0 % 41.5  34.9  31.0   Platelets 150 - 400 K/uL 283  301  173    Lab Results  Component Value Date   HGBA1C 6.3 (H) 01/09/2022    Lab Results  Component Value Date   TSH 1.26 01/23/2022    Review of Systems  Cardiovascular:  Positive for leg swelling (right leg pain and swelling 3 weeks). Negative for chest pain and dyspnea on exertion.   Physical Exam:   VS:  BP 122/68 (BP Location: Right Arm, Patient Position: Sitting, Cuff Size: Normal)   Pulse 71   Resp 16   Ht 5\' 5"  (1.651 m)   Wt 150 lb 6.4 oz (68.2 kg)   SpO2 99%   BMI 25.03 kg/m    Wt Readings from Last 3 Encounters:  12/29/23 150 lb 6.4 oz (68.2 kg)  11/21/23 155 lb (70.3 kg)  10/24/23 153 lb 9.6 oz (69.7 kg)    Physical Exam Neck:     Vascular: No carotid bruit  or JVD.  Cardiovascular:     Rate and Rhythm: Normal rate and regular rhythm.     Pulses: Intact distal pulses.          Dorsalis pedis pulses are 1+ on the right side and 1+ on the left side.     Heart sounds: Normal heart sounds. No murmur heard.    No gallop.  Pulmonary:     Effort: Pulmonary effort is normal.     Breath sounds: Normal breath sounds.  Abdominal:     General: Bowel sounds are normal.     Palpations: Abdomen is soft.  Musculoskeletal:     Right lower leg: Edema (warm and trace edema below knee) present.     Left lower leg: No edema.    Studies Reviewed: Marland Kitchen     EKG:         Medications and allergies    Allergies  Allergen Reactions   Benadryl [Diphenhydramine] Itching and Other (See Comments)    Can only take dye free. States the dye causes itching.   Darvon [Propoxyphene] Itching   Hydrocodone Bit-Homatrop Mbr Itching   Metformin Nausea And Vomiting   Pentazocine Other (See Comments)      jitters   Percocet [Oxycodone-Acetaminophen] Itching   Sulfa Antibiotics Hives   Tetracaine Hcl Itching   Tetracyclines & Related Hives   Tramadol Itching   Aspirin Nausea And Vomiting   Other  Other (See Comments)    Other reaction(s): Unknown. States reaction was to nasal spray that caused pupils to shrink     Current Outpatient Medications:    acetaminophen (TYLENOL) 325 MG tablet, Take 2 tablets (650 mg total) by mouth every 4 (four) hours as needed for fever (greater than 37.6 C). (Patient taking differently: Take 650 mg by mouth daily as needed (pain).), Disp: , Rfl:    albuterol (VENTOLIN HFA) 108 (90 Base) MCG/ACT inhaler, Inhale 2 puffs into the lungs every 4 (four) hours as needed for wheezing or shortness of breath., Disp: 8.5 g, Rfl: 0   ALPRAZolam (XANAX) 0.25 MG tablet, Take 0.25 mg by mouth daily as needed., Disp: , Rfl:    amLODipine (NORVASC) 10 MG tablet, Take 5 mg by mouth daily., Disp: , Rfl:    atorvastatin (LIPITOR) 20 MG tablet, Take 1 tablet (20 mg total) by mouth daily., Disp: 30 tablet, Rfl: 0   B Complex-C (B-COMPLEX WITH VITAMIN C) tablet, Take 1 tablet by mouth daily., Disp: 30 tablet, Rfl: 0   dicyclomine (BENTYL) 10 MG capsule, Take 1 capsule (10 mg total) by mouth 3 (three) times daily before meals. (Patient taking differently: Take 10 mg by mouth 4 (four) times daily -  before meals and at bedtime.), Disp: 90 capsule, Rfl: 0   escitalopram (LEXAPRO) 5 MG tablet, Take 5 mg by mouth daily., Disp: , Rfl:    FARXIGA 10 MG TABS tablet, Take 10 mg by mouth daily., Disp: , Rfl:    Ferrous Sulfate (IRON PO), Take 1 tablet by mouth daily., Disp: , Rfl:    gabapentin (NEURONTIN) 100 MG capsule, Take 100 mg by mouth 2 (two) times daily., Disp: , Rfl:    Glucerna (GLUCERNA) LIQD, Take 237 mLs by mouth 2 (two) times daily between meals., Disp: , Rfl:    iron polysaccharides (NIFEREX) 150 MG capsule, Take 150 mg by mouth daily., Disp: , Rfl:    LANTUS SOLOSTAR 100 UNIT/ML Solostar Pen, Inject 20 Units into the skin daily., Disp: , Rfl:  lidocaine (LIDODERM) 5 %, Place 2 patches onto the skin daily. Remove & Discard patch within 12 hours or as directed by MD (Patient  taking differently: Place 1 patch onto the skin daily as needed (pain). Remove & Discard patch within 12 hours or as directed by MD), Disp: 60 patch, Rfl: 0   metoprolol succinate (TOPROL-XL) 25 MG 24 hr tablet, Take by mouth., Disp: , Rfl:    pantoprazole (PROTONIX) 40 MG tablet, Take 1 tablet (40 mg total) by mouth 2 (two) times daily., Disp: 60 tablet, Rfl: 0   SYMBICORT 160-4.5 MCG/ACT inhaler, Inhale 2 puffs into the lungs in the morning and at bedtime., Disp: , Rfl:    temazepam (RESTORIL) 7.5 MG capsule, Take 1 capsule (7.5 mg total) by mouth at bedtime as needed for sleep., Disp: 30 capsule, Rfl: 0   tiZANidine (ZANAFLEX) 4 MG tablet, Take 4 mg by mouth at bedtime as needed., Disp: , Rfl:    TRUE METRIX BLOOD GLUCOSE TEST test strip, 1 each by Other route as needed for other., Disp: , Rfl:    TRUEplus Lancets 33G MISC, , Disp: , Rfl:    Vitamin D, Ergocalciferol, (DRISDOL) 1.25 MG (50000 UNIT) CAPS capsule, Take 1 capsule (50,000 Units total) by mouth once a week., Disp: 5 capsule, Rfl: 0   ASSESSMENT AND PLAN: .      ICD-10-CM   1. Primary hypercoagulable state (HCC)  D68.59     2. Chronic deep vein thrombosis (DVT) of femoral vein of left lower extremity (HCC)  I82.512 VAS [name] LOWER EXTREMITY VENOUS (DVT)    3. Localized swelling of right lower leg  R22.41 VAS [name] LOWER EXTREMITY VENOUS (DVT)    4. S/P insertion of IVC (inferior vena caval) filter Bard Denali filter 06/08/22)  Z95.828       1. Primary hypercoagulable state (HCC) (Primary) Patient with primary hypercoagulable state and presenting with massive pulmonary embolism and cardiac arrest will need lifelong IVC filter in situ as she has not been able to tolerate anticoagulants.  2. Chronic deep vein thrombosis (DVT) of femoral vein of left lower extremity (HCC) Patient has chronic DVT left lower extremity.  Today she is complaining of pain and swelling in the right lower extremity, right leg is warm and mild tenderness is  noted in the calves, cannot exclude DVT.  Venous duplex ordered for the right lower extremity. - VAS [name] LOWER EXTREMITY VENOUS (DVT); Future  3. Localized swelling of right lower leg As dictated above. - VAS [name] LOWER EXTREMITY VENOUS (DVT); Future  4. S/P insertion of IVC (inferior vena caval) filter Bard Denali filter 06/08/22) After discussions with hematology, IVC filter will remain in place indefinitely in view of life-threatening PE that was spontaneous and hypercoagulable state, she was also positive for lupus anticoagulant.  She is being followed by hematology as well.  Hopefully the lower extremity venous duplex that I ordered today is negative for DVT however if positive, we will have to see whether she will be able to tolerate anticoagulation. Question is whether her GI bleed was precipitated by critical illness and now that she is recuperated well, with a low-dose 2.5 mg twice daily of Eliquis for 10 mg of Xarelto once a day would be appropriate in preventing IVC syndrome secondary to chronic IVC filter.    5. Acute DVT: RIGHT:  - Findings consistent with acute deep vein thrombosis involving the right  common femoral vein, right femoral vein, right popliteal vein, and right  posterior  tibial veins in the proximal calf.   Meds ordered this encounter  Medications   apixaban (ELIQUIS) 5 MG TABS tablet    Sig: Take 1 tablet (5 mg total) by mouth 2 (two) times daily.    Dispense:  180 tablet    Refill:  1      Signed,  Yates Decamp, MD, [facility] 12/29/2023, 1:06 PM [facility] [address] [place], [place] [address] Phone: [phone]. Fax:  [phone]

## 2023-12-30 ENCOUNTER — Ambulatory Visit: Payer: Medicare HMO | Admitting: Podiatry

## 2023-12-30 ENCOUNTER — Ambulatory Visit (HOSPITAL_COMMUNITY)
Admission: RE | Admit: 2023-12-30 | Discharge: 2023-12-30 | Disposition: A | Discharge status: Home / Self Care | Source: Ambulatory Visit | Attending: Cardiology | Admitting: Cardiology

## 2023-12-30 ENCOUNTER — Telehealth: Payer: Self-pay | Admitting: Cardiology

## 2023-12-30 ENCOUNTER — Encounter: Payer: Self-pay | Admitting: Podiatry

## 2023-12-30 DIAGNOSIS — Z79899 Other long term (current) drug therapy: Secondary | ICD-10-CM

## 2023-12-30 DIAGNOSIS — I82512 Chronic embolism and thrombosis of left femoral vein: Secondary | ICD-10-CM | POA: Insufficient documentation

## 2023-12-30 DIAGNOSIS — R2241 Localized swelling, mass and lump, right lower limb: Secondary | ICD-10-CM | POA: Diagnosis not present

## 2023-12-30 DIAGNOSIS — B351 Tinea unguium: Secondary | ICD-10-CM | POA: Diagnosis not present

## 2023-12-30 DIAGNOSIS — Z5181 Encounter for therapeutic drug level monitoring: Secondary | ICD-10-CM

## 2023-12-30 DIAGNOSIS — Q828 Other specified congenital malformations of skin: Secondary | ICD-10-CM | POA: Diagnosis not present

## 2023-12-30 DIAGNOSIS — M79674 Pain in right toe(s): Secondary | ICD-10-CM | POA: Diagnosis not present

## 2023-12-30 DIAGNOSIS — D6859 Other primary thrombophilia: Secondary | ICD-10-CM

## 2023-12-30 DIAGNOSIS — M79675 Pain in left toe(s): Secondary | ICD-10-CM

## 2023-12-30 DIAGNOSIS — I82421 Acute embolism and thrombosis of right iliac vein: Secondary | ICD-10-CM

## 2023-12-30 DIAGNOSIS — E1142 Type 2 diabetes mellitus with diabetic polyneuropathy: Secondary | ICD-10-CM

## 2023-12-30 MED ORDER — APIXABAN 5 MG PO TABS: 5.0000 mg | ORAL_TABLET | Freq: Two times a day (BID) | ORAL | 1 refills | Status: DC

## 2023-12-30 NOTE — Telephone Encounter (Signed)
 After heart attacks in 2023 she was on Eliquis.  One morning she had a significant GI bleed.  Hospitalized for a week.  IVC filter placed, eliquis was stopped and adv to not take any NSAIDS.  She is now living by herself and she is afraid for something like that to happen again.      The medicine wasn't ready so hasn't been picked up yet.   Unsure where to note showing 'pt not taking' came from today.

## 2023-12-30 NOTE — Telephone Encounter (Signed)
 Received a call from a Darl Pikes with [facility]. She called to report acute DVT results. IVC filter + blood clot. She says the order advised to contact Dr. Jacinto Halim if critical. Unable to contact RN by secure chat or pod E.

## 2023-12-30 NOTE — Telephone Encounter (Signed)
 Spoke with patient and discussed with her Dr. Jacinto Halim understands her concern with taking Eliquis given her past experience, but to not take the Eliquis with a DVT is too high-risk.  Patient states she had discussed with Dr. Venita Sheffield office and did pick up the Eliquis and took her first dose a few minutes ago.  Instructed patient that is she has any s/sx of bleeding to hold her Eliquis and call our office per Dr. Verl Dicker instructions.  Per Dr. Jacinto Halim: CBC in 1 month and F/U OV in 6 weeks. CBC ordered and released to [facility]. Appt with Dr. Jacinto Halim scheduled for 02/17/24 at 4:00 PM.  Patient verbalized understanding of the above and expressed appreciation for call.

## 2023-12-30 NOTE — Addendum Note (Signed)
 Addended by: Delrae Rend on: 12/30/2023 09:34 AM   Modules accepted: Orders, Level of Service

## 2023-12-30 NOTE — Telephone Encounter (Signed)
 Pt calling in stating she is scared to take an anticoagulation like Eliquis. She wants to discuss this.

## 2023-12-30 NOTE — Progress Notes (Signed)
 I think we should start her on Eliquis. I fear IVC syndrome with this large amount of thrombus. Also GI GI bleed, she was extremely ill.  We can continue to monitor her CBC on a frequent basis and once her DVT is resolved we could switch her to DVT dose at 2.5 mg Eliquis twice daily.  I have started her on Eliquis today with 5 mg twice daily without bolusing her with 10 mg twice daily dose.

## 2023-12-30 NOTE — Telephone Encounter (Signed)
 Dr. Jacinto Halim spoke with [name] at [facility] regarding patient, verbal orders given to [facility] to set patient up with anticoagulation.

## 2023-12-31 NOTE — Progress Notes (Signed)
 Subjective:  73 year old female presents the office today for concerns of thick, elongated toenails that she is not able to trim himself.  No open lesions.  Recently just had a venous duplex given the leg pain revealed an acute DVT.    Objective: AAO x3, NAD DP/PT pulses palpable bilaterally, CRT less than 3 seconds Sensation decreased with Semmes Weinstein monofilament. Nails are hypertrophic, dystrophic, brittle, discolored, elongated 10. No surrounding redness or drainage. Tenderness nails 1-5 bilaterally.  Hyperkeratotic tissue noted left foot submetatarsal 3. No underlying ulceration, drainage or any signs of infection. Dry skin present. Decreased dorsiflexion of the right ankle.  There is significant pain in the ankle. No open lesions bilaterally. No pain with calf compression, swelling, warmth, erythema  Assessment: Symptomatic onychomycosis; unresolved lesions  Plan: -All treatment options discussed with the patient including all alternatives, risks, complications.  -Sharply debrided nails x 10  without any complications or bleeding. -Sharply debrided hyperkeratotic tissue x 1 without any complications or bleeding.  Offloading. -Discussed moisturizers. -Daily foot inspection. -Patient encouraged to call the office with any questions, concerns, change in symptoms.   Return in about 3 months (around 03/31/2024).  Vivi Barrack DPM

## 2024-01-15 ENCOUNTER — Inpatient Hospital Stay: Attending: Hematology & Oncology

## 2024-01-15 ENCOUNTER — Inpatient Hospital Stay (HOSPITAL_BASED_OUTPATIENT_CLINIC_OR_DEPARTMENT_OTHER): Admitting: Hematology & Oncology

## 2024-01-15 ENCOUNTER — Encounter: Payer: Self-pay | Admitting: Hematology & Oncology

## 2024-01-15 VITALS — BP 122/61 | HR 72 | Temp 98.5°F | Resp 20 | Ht 65.0 in | Wt 150.0 lb

## 2024-01-15 DIAGNOSIS — F17210 Nicotine dependence, cigarettes, uncomplicated: Secondary | ICD-10-CM | POA: Diagnosis not present

## 2024-01-15 DIAGNOSIS — I2692 Saddle embolus of pulmonary artery without acute cor pulmonale: Secondary | ICD-10-CM | POA: Insufficient documentation

## 2024-01-15 DIAGNOSIS — Z86711 Personal history of pulmonary embolism: Secondary | ICD-10-CM

## 2024-01-15 DIAGNOSIS — Z1231 Encounter for screening mammogram for malignant neoplasm of breast: Secondary | ICD-10-CM | POA: Diagnosis not present

## 2024-01-15 DIAGNOSIS — Z95828 Presence of other vascular implants and grafts: Secondary | ICD-10-CM | POA: Diagnosis not present

## 2024-01-15 DIAGNOSIS — Z79899 Other long term (current) drug therapy: Secondary | ICD-10-CM | POA: Insufficient documentation

## 2024-01-15 DIAGNOSIS — I82411 Acute embolism and thrombosis of right femoral vein: Secondary | ICD-10-CM | POA: Diagnosis not present

## 2024-01-15 DIAGNOSIS — K922 Gastrointestinal hemorrhage, unspecified: Secondary | ICD-10-CM | POA: Insufficient documentation

## 2024-01-15 DIAGNOSIS — I469 Cardiac arrest, cause unspecified: Secondary | ICD-10-CM | POA: Insufficient documentation

## 2024-01-15 DIAGNOSIS — N289 Disorder of kidney and ureter, unspecified: Secondary | ICD-10-CM | POA: Insufficient documentation

## 2024-01-15 LAB — CBC WITH DIFFERENTIAL (CANCER CENTER ONLY)
Abs Immature Granulocytes: 0.01 10*3/uL (ref 0.00–0.07)
Basophils Absolute: 0.1 10*3/uL (ref 0.0–0.1)
Basophils Relative: 1 %
Eosinophils Absolute: 0.1 10*3/uL (ref 0.0–0.5)
Eosinophils Relative: 1 %
HCT: 41.1 % (ref 36.0–46.0)
Hemoglobin: 13.1 g/dL (ref 12.0–15.0)
Immature Granulocytes: 0 %
Lymphocytes Relative: 26 %
Lymphs Abs: 1.8 10*3/uL (ref 0.7–4.0)
MCH: 26.2 pg (ref 26.0–34.0)
MCHC: 31.9 g/dL (ref 30.0–36.0)
MCV: 82.2 fL (ref 80.0–100.0)
Monocytes Absolute: 0.6 10*3/uL (ref 0.1–1.0)
Monocytes Relative: 8 %
Neutro Abs: 4.4 10*3/uL (ref 1.7–7.7)
Neutrophils Relative %: 64 %
Platelet Count: 307 10*3/uL (ref 150–400)
RBC: 5 MIL/uL (ref 3.87–5.11)
RDW: 14.4 % (ref 11.5–15.5)
WBC Count: 6.9 10*3/uL (ref 4.0–10.5)
nRBC: 0 % (ref 0.0–0.2)

## 2024-01-15 LAB — CMP (CANCER CENTER ONLY)
ALT: 9 U/L (ref 0–44)
AST: 13 U/L — ABNORMAL LOW (ref 15–41)
Albumin: 4.4 g/dL (ref 3.5–5.0)
Alkaline Phosphatase: 84 U/L (ref 38–126)
Anion gap: 11 (ref 5–15)
BUN: 25 mg/dL — ABNORMAL HIGH (ref 8–23)
CO2: 25 mmol/L (ref 22–32)
Calcium: 9 mg/dL (ref 8.9–10.3)
Chloride: 106 mmol/L (ref 98–111)
Creatinine: 2.19 mg/dL — ABNORMAL HIGH (ref 0.44–1.00)
GFR, Estimated: 23 mL/min — ABNORMAL LOW (ref >60)
Glucose, Bld: 156 mg/dL — ABNORMAL HIGH (ref 70–99)
Potassium: 4 mmol/L (ref 3.5–5.1)
Sodium: 142 mmol/L (ref 135–145)
Total Bilirubin: 0.5 mg/dL (ref 0.0–1.2)
Total Protein: 7.1 g/dL (ref 6.5–8.1)

## 2024-01-15 LAB — D-DIMER, QUANTITATIVE: D-Dimer, Quant: 1.76 ug{FEU}/mL — ABNORMAL HIGH (ref 0.00–0.50)

## 2024-01-15 MED ORDER — NICOTINE 21 MG/24HR TD PT24: 21.0000 mg | MEDICATED_PATCH | Freq: Every day | TRANSDERMAL | 3 refills | Status: DC

## 2024-01-15 NOTE — Progress Notes (Signed)
 Hematology and Oncology Follow Up Visit  [name] [id number] 1951-05-28 73 y.o. 01/15/2024   Principle Diagnosis:  Saddle pulmonary embolism-possible lupus anticoagulant Anemia secondary to renal insufficiency Potential liver mass versus bleed  Current Therapy:   Eliquis 5 mg p.o. twice daily--DC in 05/2022 secondary to GI bleeding IVC filter placed-05/2022     Interim History:  [name] is back for a visit that is certainly much more short than I would have thought.  We actually saw her back in January.  Since then, she apparently began to have swelling in the right leg.  This was done on 12/30/2023.  This, unfortunately showed a large thrombus in the right leg.  This extended from the common femoral vein down to the peroneal vein.  She was restarted on Eliquis.  She is on Eliquis because of the renal insufficiency.  Of note, that when we last saw her in January, there is no circulating lupus anticoagulant.  Of note, she does have a filter put in.  I am not sure if the filter is blocked with clot.  We will have to see if a CT scan of the abdomen pelvis can help [name] out.  Unfortunately she is still smoking about a pack a day of cigarettes.  I am sure this is not helping her thromboembolic tendency.  Think she also has diabetes.  She really needs to have a mammogram in my opinion.  Hopefully, she will agree to have 1 done.  She has had no cough.  There is been no chest wall pain.  She has had no abdominal pain.  There is been no nausea or vomiting.  She has had no fever.  She has had no problems with COVID.  Overall, I would say that her performance status is probably ECOG 2.    Medications:  Current Outpatient Medications:    acetaminophen (TYLENOL) 325 MG tablet, Take 2 tablets (650 mg total) by mouth every 4 (four) hours as needed for fever (greater than 37.6 C). (Patient taking differently: Take 650 mg by mouth daily as needed (pain).), Disp: , Rfl:    albuterol (VENTOLIN  HFA) 108 (90 Base) MCG/ACT inhaler, Inhale 2 puffs into the lungs every 4 (four) hours as needed for wheezing or shortness of breath., Disp: 8.5 g, Rfl: 0   ALPRAZolam (XANAX) 0.25 MG tablet, Take 0.25 mg by mouth daily as needed., Disp: , Rfl:    amLODipine (NORVASC) 10 MG tablet, Take 10 mg by mouth daily., Disp: , Rfl:    apixaban (ELIQUIS) 5 MG TABS tablet, Take 1 tablet (5 mg total) by mouth 2 (two) times daily., Disp: 180 tablet, Rfl: 1   atorvastatin (LIPITOR) 20 MG tablet, Take 1 tablet (20 mg total) by mouth daily., Disp: 30 tablet, Rfl: 0   B Complex-C (B-COMPLEX WITH VITAMIN C) tablet, Take 1 tablet by mouth daily., Disp: 30 tablet, Rfl: 0   dicyclomine (BENTYL) 10 MG capsule, Take 1 capsule (10 mg total) by mouth 3 (three) times daily before meals. (Patient taking differently: Take 10 mg by mouth 4 (four) times daily -  before meals and at bedtime.), Disp: 90 capsule, Rfl: 0   escitalopram (LEXAPRO) 5 MG tablet, Take 5 mg by mouth daily., Disp: , Rfl:    FARXIGA 10 MG TABS tablet, Take 10 mg by mouth daily., Disp: , Rfl:    Ferrous Sulfate (IRON PO), Take 1 tablet by mouth daily., Disp: , Rfl:    gabapentin (NEURONTIN) 100 MG capsule,  Take 100 mg by mouth 2 (two) times daily., Disp: , Rfl:    Glucerna (GLUCERNA) LIQD, Take 237 mLs by mouth 2 (two) times daily between meals., Disp: , Rfl:    LANTUS SOLOSTAR 100 UNIT/ML Solostar Pen, Inject 20 Units into the skin daily., Disp: , Rfl:    lidocaine (LIDODERM) 5 %, Place 2 patches onto the skin daily. Remove & Discard patch within 12 hours or as directed by MD (Patient taking differently: Place 1 patch onto the skin daily as needed (pain). Remove & Discard patch within 12 hours or as directed by MD), Disp: 60 patch, Rfl: 0   metoprolol succinate (TOPROL-XL) 25 MG 24 hr tablet, Take 25 mg by mouth daily., Disp: , Rfl:    pantoprazole (PROTONIX) 40 MG tablet, Take 1 tablet (40 mg total) by mouth 2 (two) times daily., Disp: 60 tablet, Rfl: 0    SYMBICORT 160-4.5 MCG/ACT inhaler, Inhale 2 puffs into the lungs in the morning and at bedtime., Disp: , Rfl:    temazepam (RESTORIL) 7.5 MG capsule, Take 1 capsule (7.5 mg total) by mouth at bedtime as needed for sleep., Disp: 30 capsule, Rfl: 0   tiZANidine (ZANAFLEX) 4 MG tablet, Take 4 mg by mouth at bedtime as needed., Disp: , Rfl:    TRUE METRIX BLOOD GLUCOSE TEST test strip, 1 each by Other route as needed for other., Disp: , Rfl:    TRUEplus Lancets 33G MISC, , Disp: , Rfl:    Vitamin D, Ergocalciferol, (DRISDOL) 1.25 MG (50000 UNIT) CAPS capsule, Take 1 capsule (50,000 Units total) by mouth once a week., Disp: 5 capsule, Rfl: 0   iron polysaccharides (NIFEREX) 150 MG capsule, Take 150 mg by mouth daily. (Patient not taking: Reported on 01/15/2024), Disp: , Rfl:   Allergies:  Allergies  Allergen Reactions   Benadryl [Diphenhydramine] Itching and Other (See Comments)    Can only take dye free. States the dye causes itching.   Darvon [Propoxyphene] Itching   Hydrocodone Bit-Homatrop Mbr Itching   Metformin Nausea And Vomiting   Pentazocine Other (See Comments)      jitters   Percocet [Oxycodone-Acetaminophen] Itching   Sulfa Antibiotics Hives   Tetracaine Hcl Itching   Tetracyclines & Related Hives   Tramadol Itching   Aspirin Nausea And Vomiting   Other Other (See Comments)    Other reaction(s): Unknown. States reaction was to nasal spray that caused pupils to shrink    Past Medical History, Surgical history, Social history, and Family History were reviewed and updated.  Review of Systems: Review of Systems  Constitutional:  Positive for fatigue.  HENT:  Negative.    Eyes: Negative.   Respiratory: Negative.    Cardiovascular:  Positive for leg swelling.  Gastrointestinal: Negative.   Endocrine: Negative.   Genitourinary: Negative.    Musculoskeletal: Negative.   Skin: Negative.   Neurological: Negative.   Hematological: Negative.   Psychiatric/Behavioral: Negative.       Physical Exam:  height is 5\' 5"  (1.651 m) and weight is 150 lb (68 kg). Her oral temperature is 98.5 F (36.9 C). Her blood pressure is 122/61 and her pulse is 72. Her respiration is 20 and oxygen saturation is 100%.   Wt Readings from Last 3 Encounters:  01/15/24 150 lb (68 kg)  12/29/23 150 lb 6.4 oz (68.2 kg)  11/21/23 155 lb (70.3 kg)    Physical Exam Vitals reviewed.  HENT:     Head: Normocephalic and atraumatic.  Eyes:  Pupils: Pupils are equal, round, and reactive to light.  Cardiovascular:     Rate and Rhythm: Normal rate and regular rhythm.     Heart sounds: Normal heart sounds.  Pulmonary:     Effort: Pulmonary effort is normal.     Breath sounds: Normal breath sounds.  Abdominal:     General: Bowel sounds are normal.     Palpations: Abdomen is soft.  Musculoskeletal:        General: No tenderness or deformity. Normal range of motion.     Cervical back: Normal range of motion.  Lymphadenopathy:     Cervical: No cervical adenopathy.  Skin:    General: Skin is warm and dry.     Findings: No erythema or rash.  Neurological:     Mental Status: She is alert and oriented to person, place, and time.  Psychiatric:        Behavior: Behavior normal.        Thought Content: Thought content normal.        Judgment: Judgment normal.     Lab Results  Component Value Date   WBC 6.9 01/15/2024   HGB 13.1 01/15/2024   HCT 41.1 01/15/2024   MCV 82.2 01/15/2024   PLT 307 01/15/2024     Chemistry      Component Value Date/Time   NA 142 01/15/2024 1218   NA 139 01/23/2022 0000   K 4.0 01/15/2024 1218   CL 106 01/15/2024 1218   CO2 25 01/15/2024 1218   BUN 25 (H) 01/15/2024 1218   BUN 23 (A) 01/23/2022 0000   CREATININE 2.19 (H) 01/15/2024 1218   CREATININE 1.14 (H) 03/06/2017 1502   GLU 138 01/23/2022 0000      Component Value Date/Time   CALCIUM 9.0 01/15/2024 1218   ALKPHOS 84 01/15/2024 1218   AST 13 (L) 01/15/2024 1218   ALT 9 01/15/2024 1218    BILITOT 0.5 01/15/2024 1218      Impression and Plan: Ms. Woolford is a very nice 73 year old Afro-American female.  She came in with a cardiac arrest.  She had a pulmonary embolism that was a saddle embolus.  She managed to get through this.  She had temporary renal failure.  She had temporary hepatic failure.  Thankfully, she was able to get off all of the supporting machines.  She still has an element of renal insufficiency.  I am not sure if we can do a CT with contrast to try to identify any clot at the IVC filter.  Again, we will have her on Eliquis.  I really hate this for her.  I know that we had on Eliquis when she first showed up with thromboembolic disease but we had to get her off because of GI bleeding.  I would like to see her back in about a month or so.  I think we had to really stay vigilant with her.    Josph Macho, MD 3/20/20251:28 PM

## 2024-01-16 ENCOUNTER — Ambulatory Visit (HOSPITAL_COMMUNITY)
Admission: RE | Admit: 2024-01-16 | Discharge: 2024-01-16 | Disposition: A | Discharge status: Home / Self Care | Source: Ambulatory Visit | Attending: Hematology & Oncology | Admitting: Hematology & Oncology

## 2024-01-16 DIAGNOSIS — K769 Liver disease, unspecified: Secondary | ICD-10-CM | POA: Diagnosis not present

## 2024-01-16 DIAGNOSIS — I82411 Acute embolism and thrombosis of right femoral vein: Secondary | ICD-10-CM | POA: Diagnosis not present

## 2024-01-16 DIAGNOSIS — K5730 Diverticulosis of large intestine without perforation or abscess without bleeding: Secondary | ICD-10-CM | POA: Diagnosis not present

## 2024-01-16 DIAGNOSIS — K5790 Diverticulosis of intestine, part unspecified, without perforation or abscess without bleeding: Secondary | ICD-10-CM | POA: Diagnosis not present

## 2024-01-16 DIAGNOSIS — I700 Atherosclerosis of aorta: Secondary | ICD-10-CM | POA: Diagnosis not present

## 2024-01-16 DIAGNOSIS — K8020 Calculus of gallbladder without cholecystitis without obstruction: Secondary | ICD-10-CM | POA: Insufficient documentation

## 2024-01-16 DIAGNOSIS — Z1231 Encounter for screening mammogram for malignant neoplasm of breast: Secondary | ICD-10-CM

## 2024-01-17 LAB — LUPUS ANTICOAGULANT PANEL
DRVVT: 72.5 s — ABNORMAL HIGH (ref 0.0–47.0)
PTT Lupus Anticoagulant: 33.6 s (ref 0.0–43.5)

## 2024-01-17 LAB — CARDIOLIPIN ANTIBODIES, IGG, IGM, IGA
Anticardiolipin IgA: <9 U/mL (ref 0–11)
Anticardiolipin IgG: <9 GPL U/mL (ref 0–14)
Anticardiolipin IgM: <9 [MPL'U]/mL (ref 0–12)

## 2024-01-17 LAB — DRVVT CONFIRM: dRVVT Confirm: 1 ratio (ref 0.8–1.2)

## 2024-01-17 LAB — DRVVT MIX: dRVVT Mix: 52.8 s — ABNORMAL HIGH (ref 0.0–40.4)

## 2024-01-21 ENCOUNTER — Encounter: Payer: Self-pay | Admitting: Hematology & Oncology

## 2024-01-26 ENCOUNTER — Telehealth: Payer: Self-pay | Admitting: *Deleted

## 2024-01-26 NOTE — Telephone Encounter (Signed)
-----   Message from Josph Macho sent at 01/26/2024  7:13 AM EDT ----- Please call and let her know that the CT scan does not show anything that is obvious with blood clot.

## 2024-01-26 NOTE — Telephone Encounter (Signed)
 Msg to pt via MyChart

## 2024-02-11 DIAGNOSIS — Z794 Long term (current) use of insulin: Secondary | ICD-10-CM | POA: Diagnosis not present

## 2024-02-11 DIAGNOSIS — E1121 Type 2 diabetes mellitus with diabetic nephropathy: Secondary | ICD-10-CM | POA: Diagnosis not present

## 2024-02-11 DIAGNOSIS — J4530 Mild persistent asthma, uncomplicated: Secondary | ICD-10-CM | POA: Diagnosis not present

## 2024-02-11 DIAGNOSIS — I5030 Unspecified diastolic (congestive) heart failure: Secondary | ICD-10-CM | POA: Diagnosis not present

## 2024-02-11 DIAGNOSIS — N184 Chronic kidney disease, stage 4 (severe): Secondary | ICD-10-CM | POA: Diagnosis not present

## 2024-02-11 DIAGNOSIS — E1142 Type 2 diabetes mellitus with diabetic polyneuropathy: Secondary | ICD-10-CM | POA: Diagnosis not present

## 2024-02-11 DIAGNOSIS — D6869 Other thrombophilia: Secondary | ICD-10-CM | POA: Diagnosis not present

## 2024-02-11 DIAGNOSIS — I10 Essential (primary) hypertension: Secondary | ICD-10-CM | POA: Diagnosis not present

## 2024-02-11 DIAGNOSIS — F331 Major depressive disorder, recurrent, moderate: Secondary | ICD-10-CM | POA: Diagnosis not present

## 2024-02-17 ENCOUNTER — Encounter: Payer: Self-pay | Admitting: Cardiology

## 2024-02-17 ENCOUNTER — Ambulatory Visit: Attending: Cardiology | Admitting: Cardiology

## 2024-02-17 VITALS — BP 110/62 | HR 79 | Resp 16 | Ht 65.0 in | Wt 149.2 lb

## 2024-02-17 DIAGNOSIS — I82403 Acute embolism and thrombosis of unspecified deep veins of lower extremity, bilateral: Secondary | ICD-10-CM | POA: Diagnosis not present

## 2024-02-17 DIAGNOSIS — D6859 Other primary thrombophilia: Secondary | ICD-10-CM

## 2024-02-17 MED ORDER — APIXABAN 2.5 MG PO TABS: 2.5000 mg | ORAL_TABLET | Freq: Two times a day (BID) | ORAL | 0 refills | Status: DC

## 2024-02-17 NOTE — Progress Notes (Signed)
 Cardiology Office Note:  .   Date:  02/17/2024  ID:  [name], DOB 10-31-1950, MRN [id number] PCP: Jearldine Mina, MD  [facility] Providers Cardiologist:  Knox Perl, MD   History of Present Illness: .   [name] is a 73 y.o. female patient with diabetes mellitus with stage 4 CKD, primary hypertension, hypercholesterolemia, fibromyalgia, with history of acute cor pulmonale presented with cardiac arrest on 10/28/2021 secondary to acute pulmonary embolism was placed on Eliquis  however developed life-threatening GI bleed due to diverticulitis and Eliquis  was discontinued and IVC filter placed on 06/08/2022.    At last seen around 12/29/2023 and she had complained of new right lower extremity swelling and pain, venous duplex revealed extensive thrombosis involving right CFV, right popliteal vein and posterior tibial veins.  She was started on Eliquis  to 5 mg twice daily.  She now presents for follow-up.  Her right leg edema has completely resolved.  There is no acute inflammatory signs to suggest acute DVT.  She feels well and is tolerating Eliquis  5 mg twice daily without bleeding diathesis.  She has been careful to watch her diet not to precipitate acute diverticulitis.  Patient is accompanied by her daughter.  Discussed the use of AI scribe software for clinical note transcription with the patient, who gave verbal consent to proceed.  History of Present Illness   Labs   Lab Results  Component Value Date   TRIG 154 (H) 11/15/2021   Lab Results  Component Value Date   NA 142 01/15/2024   K 4.0 01/15/2024   CO2 25 01/15/2024   GLUCOSE 156 (H) 01/15/2024   BUN 25 (H) 01/15/2024   CREATININE 2.19 (H) 01/15/2024   CALCIUM  9.0 01/15/2024   GFRNONAA 23 (L) 01/15/2024      Latest Ref Rng & Units 01/15/2024   12:18 PM 11/21/2023    2:42 PM 07/14/2022   12:55 PM  BMP  Glucose 70 - 99 mg/dL 409  811  914   BUN 8 - 23 mg/dL 25  27  28    Creatinine 0.44 - 1.00 mg/dL 7.82   9.56  2.13   Sodium 135 - 145 mmol/L 142  141  140   Potassium 3.5 - 5.1 mmol/L 4.0  5.0  3.8   Chloride 98 - 111 mmol/L 106  108  111   CO2 22 - 32 mmol/L 25  26  19    Calcium  8.9 - 10.3 mg/dL 9.0  8.9  9.4       Latest Ref Rng & Units 01/15/2024   12:18 PM 11/21/2023    2:42 PM 07/14/2022   12:55 PM  CBC  WBC 4.0 - 10.5 K/uL 6.9  6.8  8.2   Hemoglobin 12.0 - 15.0 g/dL 08.6  57.8  46.9   Hematocrit 36.0 - 46.0 % 41.1  41.5  34.9   Platelets 150 - 400 K/uL 307  283  301    Lab Results  Component Value Date   HGBA1C 6.3 (H) 01/09/2022    Lab Results  Component Value Date   TSH 1.26 01/23/2022   Review of Systems  Cardiovascular:  Negative for chest pain, dyspnea on exertion and leg swelling.   Physical Exam:   VS:  BP 110/62 (BP Location: Left Arm, Patient Position: Sitting, Cuff Size: Normal)   Pulse 79   Resp 16   Ht 5\' 5"  (1.651 m)   Wt 149 lb 3.2 oz (67.7 kg)   SpO2 97%  BMI 24.83 kg/m    Wt Readings from Last 3 Encounters:  02/17/24 149 lb 3.2 oz (67.7 kg)  01/15/24 150 lb (68 kg)  12/29/23 150 lb 6.4 oz (68.2 kg)    Physical Exam Neck:     Vascular: No carotid bruit or JVD.  Cardiovascular:     Rate and Rhythm: Normal rate and regular rhythm.     Heart sounds: Normal heart sounds. No murmur heard.    No gallop.  Pulmonary:     Effort: Pulmonary effort is normal.     Breath sounds: Normal breath sounds.  Abdominal:     General: Bowel sounds are normal.     Palpations: Abdomen is soft.  Musculoskeletal:     Right lower leg: No edema.     Left lower leg: No edema.    Studies Reviewed: Aaron Aas    Lower extremity venous duplex 12/30/2023: Findings consistent with acute deep vein thrombosis involving the right  common femoral vein, right femoral vein, right popliteal vein, and right  posterior tibial veins in the proximal calf.   EKG:    Medications and allergies    Allergies  Allergen Reactions   Benadryl  [Diphenhydramine ] Itching and Other (See  Comments)    Can only take dye free. States the dye causes itching.   Darvon [Propoxyphene] Itching   Hydrocodone  Bit-Homatrop Mbr Itching   Metformin Nausea And Vomiting   Pentazocine Other (See Comments)      jitters   Percocet [Oxycodone -Acetaminophen ] Itching   Sulfa Antibiotics Hives   Tetracaine Hcl Itching   Tetracyclines & Related Hives   Tramadol Itching   Aspirin Nausea And Vomiting   Other Other (See Comments)    Other reaction(s): Unknown. States reaction was to nasal spray that caused pupils to shrink     Current Outpatient Medications:    acetaminophen  (TYLENOL ) 325 MG tablet, Take 2 tablets (650 mg total) by mouth every 4 (four) hours as needed for fever (greater than 37.6 C). (Patient taking differently: Take 650 mg by mouth daily as needed (pain).), Disp: , Rfl:    albuterol  (VENTOLIN  HFA) 108 (90 Base) MCG/ACT inhaler, Inhale 2 puffs into the lungs every 4 (four) hours as needed for wheezing or shortness of breath., Disp: 8.5 g, Rfl: 0   ALPRAZolam  (XANAX ) 0.25 MG tablet, Take 0.25 mg by mouth daily as needed., Disp: , Rfl:    amLODipine  (NORVASC ) 10 MG tablet, Take 10 mg by mouth daily., Disp: , Rfl:    apixaban  (ELIQUIS ) 2.5 MG TABS tablet, Take 1 tablet (2.5 mg total) by mouth 2 (two) times daily., Disp: 180 tablet, Rfl: 0   atorvastatin  (LIPITOR) 20 MG tablet, Take 1 tablet (20 mg total) by mouth daily., Disp: 30 tablet, Rfl: 0   dicyclomine  (BENTYL ) 10 MG capsule, Take 1 capsule (10 mg total) by mouth 3 (three) times daily before meals. (Patient taking differently: Take 10 mg by mouth 4 (four) times daily -  before meals and at bedtime.), Disp: 90 capsule, Rfl: 0   escitalopram (LEXAPRO) 5 MG tablet, Take 5 mg by mouth daily., Disp: , Rfl:    FARXIGA 10 MG TABS tablet, Take 10 mg by mouth daily., Disp: , Rfl:    Ferrous Sulfate (IRON PO), Take 1 tablet by mouth daily., Disp: , Rfl:    gabapentin  (NEURONTIN ) 100 MG capsule, Take 100 mg by mouth 2 (two) times daily.,  Disp: , Rfl:    LANTUS SOLOSTAR 100 UNIT/ML Solostar Pen, Inject 20 Units into  the skin daily., Disp: , Rfl:    lidocaine  (LIDODERM ) 5 %, Place 2 patches onto the skin daily. Remove & Discard patch within 12 hours or as directed by MD (Patient taking differently: Place 1 patch onto the skin daily as needed (pain). Remove & Discard patch within 12 hours or as directed by MD), Disp: 60 patch, Rfl: 0   metoprolol  succinate (TOPROL -XL) 25 MG 24 hr tablet, Take 25 mg by mouth daily., Disp: , Rfl:    pantoprazole  (PROTONIX ) 40 MG tablet, Take 1 tablet (40 mg total) by mouth 2 (two) times daily., Disp: 60 tablet, Rfl: 0   SYMBICORT 160-4.5 MCG/ACT inhaler, Inhale 2 puffs into the lungs in the morning and at bedtime., Disp: , Rfl:    temazepam  (RESTORIL ) 7.5 MG capsule, Take 1 capsule (7.5 mg total) by mouth at bedtime as needed for sleep., Disp: 30 capsule, Rfl: 0   tiZANidine (ZANAFLEX) 4 MG tablet, Take 4 mg by mouth at bedtime as needed., Disp: , Rfl:    TRUE METRIX BLOOD GLUCOSE TEST test strip, 1 each by Other route as needed for other., Disp: , Rfl:    TRUEplus Lancets 33G MISC, , Disp: , Rfl:    Vitamin D , Ergocalciferol , (DRISDOL ) 1.25 MG (50000 UNIT) CAPS capsule, Take 1 capsule (50,000 Units total) by mouth once a week., Disp: 5 capsule, Rfl: 0   Meds ordered this encounter  Medications   apixaban  (ELIQUIS ) 2.5 MG TABS tablet    Sig: Take 1 tablet (2.5 mg total) by mouth 2 (two) times daily.    Dispense:  180 tablet    Refill:  0    Refills to Dr. Karrar Husain     Medications Discontinued During This Encounter  Medication Reason   iron polysaccharides (NIFEREX) 150 MG capsule Change in therapy   nicotine  (NICODERM CQ  - DOSED IN MG/24 HOURS) 21 mg/24hr patch Patient Preference   B Complex-C (B-COMPLEX WITH VITAMIN C) tablet Patient Preference   Glucerna (GLUCERNA) LIQD Patient Preference   apixaban  (ELIQUIS ) 5 MG TABS tablet Dose change     ASSESSMENT AND PLAN: .      ICD-10-CM    1. Primary hypercoagulable state (HCC)  D68.59 apixaban  (ELIQUIS ) 2.5 MG TABS tablet    2. Recurrent deep vein thrombosis (DVT) of both lower extremities (HCC)  I82.403 apixaban  (ELIQUIS ) 2.5 MG TABS tablet      Assessment and Plan Assessment & Plan Deep vein thrombosis   She is on Eliquis  (apixaban ) 5 mg twice daily for DVT. The leg swelling has resolved, no signs of acute inflammation, no tenderness indicating the DVT has likely dissolved. Due to her history of gastrointestinal bleeding, reduce the Eliquis  dose to 2.5 mg twice daily DVT prophylaxis dose to minimize bleeding risk.  Consultation with Dr. Maria Shiner in the past confirmed against filter removal due to potential life-threatening complications and potential for recurrent PE.  Monitor for signs of bleeding, such as abdominal pain or changes in stool frequency. Instruct her to stop Eliquis  and seek medical attention if signs of bleeding occur.    Diverticulitis with history of GI bleed   She experienced a gastrointestinal bleed due to diverticulitis approximately 18 months ago, requiring a blood transfusion. She is cautious with her diet to prevent recurrence. The bleeding risk is a concern with anticoagulation therapy.  I have advised the patient to follow-up with gastroenterologist Dr. Evangeline Hilts for evaluation and management of diverticulitis and to discuss dietary recommendations. Ensure Dr. Kimble Pennant is informed about  her anticoagulation therapy.  Otherwise from cardiac standpoint, she is stable, I will see her back on a as needed basis.  Future refills of Eliquis  is with her PCP.   Signed,  Knox Perl, MD, [facility] 02/17/2024, 4:59 PM [facility] [address] [place], [place] [address] Phone: [phone]. Fax:  [phone]

## 2024-02-17 NOTE — Patient Instructions (Signed)
 Medication Instructions:  Decrease Eliquis  to 2.5 mg by mouth twice daily  *If you need a refill on your cardiac medications before your next appointment, please call your pharmacy*  Lab Work: none If you have labs (blood work) drawn today and your tests are completely normal, you will receive your results only by: MyChart Message (if you have MyChart) OR A paper copy in the mail If you have any lab test that is abnormal or we need to change your treatment, we will call you to review the results.  Testing/Procedures: none  Follow-Up: At [facility], you and your health needs are our priority.  As part of our continuing mission to provide you with exceptional heart care, our providers are all part of one team.  This team includes your primary Cardiologist (physician) and Advanced Practice Providers or APPs (Physician Assistants and Nurse Practitioners) who all work together to provide you with the care you need, when you need it.  Your next appointment:   As needed  Provider:   Knox Perl, MD     We recommend signing up for the patient portal called "MyChart".  Sign up information is provided on this After Visit Summary.  MyChart is used to connect with patients for Virtual Visits (Telemedicine).  Patients are able to view lab/test results, encounter notes, upcoming appointments, etc.  Non-urgent messages can be sent to your provider as well.   To learn more about what you can do with MyChart, go to [email or web address].   Other Instructions       1st Floor: - Lobby - Registration  - Pharmacy  - Lab - Cafe  2nd Floor: - PV Lab - Diagnostic Testing (echo, CT, nuclear med)  3rd Floor: - Vacant  4th Floor: - TCTS (cardiothoracic surgery) - AFib Clinic - Structural Heart Clinic - Vascular Surgery  - Vascular Ultrasound  5th Floor: - HeartCare Cardiology (general and EP) - Clinical Pharmacy for coumadin, hypertension, lipid, weight-loss medications, and med  management appointments    Valet parking services will be available as well.

## 2024-02-20 ENCOUNTER — Inpatient Hospital Stay

## 2024-02-20 ENCOUNTER — Ambulatory Visit: Admitting: Hematology & Oncology

## 2024-02-24 ENCOUNTER — Other Ambulatory Visit: Payer: Self-pay

## 2024-02-24 DIAGNOSIS — Z86711 Personal history of pulmonary embolism: Secondary | ICD-10-CM

## 2024-02-24 DIAGNOSIS — N182 Chronic kidney disease, stage 2 (mild): Secondary | ICD-10-CM

## 2024-02-24 DIAGNOSIS — N184 Chronic kidney disease, stage 4 (severe): Secondary | ICD-10-CM

## 2024-02-25 ENCOUNTER — Inpatient Hospital Stay: Attending: Hematology & Oncology

## 2024-02-25 ENCOUNTER — Ambulatory Visit: Admitting: Hematology & Oncology

## 2024-02-25 ENCOUNTER — Inpatient Hospital Stay

## 2024-02-25 ENCOUNTER — Encounter: Payer: Self-pay | Admitting: Hematology & Oncology

## 2024-02-25 ENCOUNTER — Inpatient Hospital Stay (HOSPITAL_BASED_OUTPATIENT_CLINIC_OR_DEPARTMENT_OTHER): Admitting: Hematology & Oncology

## 2024-02-25 VITALS — BP 109/59 | HR 68 | Temp 97.7°F | Resp 17 | Wt 149.8 lb

## 2024-02-25 DIAGNOSIS — Z7901 Long term (current) use of anticoagulants: Secondary | ICD-10-CM | POA: Diagnosis not present

## 2024-02-25 DIAGNOSIS — N184 Chronic kidney disease, stage 4 (severe): Secondary | ICD-10-CM

## 2024-02-25 DIAGNOSIS — Z86711 Personal history of pulmonary embolism: Secondary | ICD-10-CM | POA: Insufficient documentation

## 2024-02-25 DIAGNOSIS — F17200 Nicotine dependence, unspecified, uncomplicated: Secondary | ICD-10-CM | POA: Insufficient documentation

## 2024-02-25 DIAGNOSIS — Z79899 Other long term (current) drug therapy: Secondary | ICD-10-CM | POA: Diagnosis not present

## 2024-02-25 DIAGNOSIS — I2602 Saddle embolus of pulmonary artery with acute cor pulmonale: Secondary | ICD-10-CM | POA: Diagnosis not present

## 2024-02-25 DIAGNOSIS — N182 Chronic kidney disease, stage 2 (mild): Secondary | ICD-10-CM

## 2024-02-25 LAB — CMP (CANCER CENTER ONLY)
ALT: 12 U/L (ref 0–44)
AST: 16 U/L (ref 15–41)
Albumin: 4.3 g/dL (ref 3.5–5.0)
Alkaline Phosphatase: 83 U/L (ref 38–126)
Anion gap: 6 (ref 5–15)
BUN: 34 mg/dL — ABNORMAL HIGH (ref 8–23)
CO2: 29 mmol/L (ref 22–32)
Calcium: 9.3 mg/dL (ref 8.9–10.3)
Chloride: 107 mmol/L (ref 98–111)
Creatinine: 2.25 mg/dL — ABNORMAL HIGH (ref 0.44–1.00)
GFR, Estimated: 22 mL/min — ABNORMAL LOW (ref >60)
Glucose, Bld: 153 mg/dL — ABNORMAL HIGH (ref 70–99)
Potassium: 4.6 mmol/L (ref 3.5–5.1)
Sodium: 142 mmol/L (ref 135–145)
Total Bilirubin: 0.3 mg/dL (ref 0.0–1.2)
Total Protein: 7.2 g/dL (ref 6.5–8.1)

## 2024-02-25 LAB — CBC WITH DIFFERENTIAL (CANCER CENTER ONLY)
Abs Immature Granulocytes: 0.01 10*3/uL (ref 0.00–0.07)
Basophils Absolute: 0.1 10*3/uL (ref 0.0–0.1)
Basophils Relative: 1 %
Eosinophils Absolute: 0.2 10*3/uL (ref 0.0–0.5)
Eosinophils Relative: 2 %
HCT: 39.6 % (ref 36.0–46.0)
Hemoglobin: 12.9 g/dL (ref 12.0–15.0)
Immature Granulocytes: 0 %
Lymphocytes Relative: 35 %
Lymphs Abs: 2.1 10*3/uL (ref 0.7–4.0)
MCH: 26.6 pg (ref 26.0–34.0)
MCHC: 32.6 g/dL (ref 30.0–36.0)
MCV: 81.6 fL (ref 80.0–100.0)
Monocytes Absolute: 0.6 10*3/uL (ref 0.1–1.0)
Monocytes Relative: 10 %
Neutro Abs: 3.2 10*3/uL (ref 1.7–7.7)
Neutrophils Relative %: 52 %
Platelet Count: 292 10*3/uL (ref 150–400)
RBC: 4.85 MIL/uL (ref 3.87–5.11)
RDW: 14.6 % (ref 11.5–15.5)
WBC Count: 6.1 10*3/uL (ref 4.0–10.5)
nRBC: 0 % (ref 0.0–0.2)

## 2024-02-25 LAB — LACTATE DEHYDROGENASE: LDH: 171 U/L (ref 98–192)

## 2024-02-25 NOTE — Progress Notes (Signed)
 Hematology and Oncology Follow Up Visit  [name] [id number] [birth year] 73 y.o. 02/25/2024   Principle Diagnosis:  Saddle pulmonary embolism-possible lupus anticoagulant Anemia secondary to renal insufficiency Potential liver mass versus bleed  Current Therapy:   Eliquis  2.5 mg p.o. twice daily IVC filter placed-05/2022     Interim History:  [name] is back for a follow-up.  We last saw her back in March.  At that time, there is no lupus anticoagulant..  She did have a CT of the abdomen pelvis.  This did not show any obvious clot in the IVC filter.  However, because the CT scan was done without contrast, it may be difficult to view the anatomy of the filter.  Thankfully, she does not have any leg swelling.  I think she is done very well.  She is still smoking.  Hopefully, this will be able to cut back..  She is on Eliquis  at 2.5 mg p.o. twice daily now.  She is eating.  She had a very nice Easter.  She has had no cough.  She has had no abdominal pain.  There is been no obvious change in bowel or bladder habits.  Overall, I will have to set her performance status is probably ECOG 1.      Medications:  Current Outpatient Medications:    acetaminophen  (TYLENOL ) 325 MG tablet, Take 2 tablets (650 mg total) by mouth every 4 (four) hours as needed for fever (greater than 37.6 C). (Patient taking differently: Take 650 mg by mouth daily as needed (pain).), Disp: , Rfl:    albuterol  (VENTOLIN  HFA) 108 (90 Base) MCG/ACT inhaler, Inhale 2 puffs into the lungs every 4 (four) hours as needed for wheezing or shortness of breath., Disp: 8.5 g, Rfl: 0   ALPRAZolam  (XANAX ) 0.25 MG tablet, Take 0.25 mg by mouth daily as needed., Disp: , Rfl:    amLODipine  (NORVASC ) 10 MG tablet, Take 10 mg by mouth daily., Disp: , Rfl:    apixaban  (ELIQUIS ) 2.5 MG TABS tablet, Take 1 tablet (2.5 mg total) by mouth 2 (two) times daily., Disp: 180 tablet, Rfl: 0   atorvastatin  (LIPITOR) 20 MG tablet, Take 1  tablet (20 mg total) by mouth daily., Disp: 30 tablet, Rfl: 0   dicyclomine  (BENTYL ) 10 MG capsule, Take 1 capsule (10 mg total) by mouth 3 (three) times daily before meals. (Patient taking differently: Take 10 mg by mouth 4 (four) times daily -  before meals and at bedtime.), Disp: 90 capsule, Rfl: 0   escitalopram (LEXAPRO) 5 MG tablet, Take 5 mg by mouth daily., Disp: , Rfl:    FARXIGA 10 MG TABS tablet, Take 10 mg by mouth daily., Disp: , Rfl:    Ferrous Sulfate (IRON PO), Take 1 tablet by mouth daily., Disp: , Rfl:    gabapentin  (NEURONTIN ) 100 MG capsule, Take 100 mg by mouth 2 (two) times daily., Disp: , Rfl:    LANTUS SOLOSTAR 100 UNIT/ML Solostar Pen, Inject 20 Units into the skin daily., Disp: , Rfl:    lidocaine  (LIDODERM ) 5 %, Place 2 patches onto the skin daily. Remove & Discard patch within 12 hours or as directed by MD (Patient taking differently: Place 1 patch onto the skin daily as needed (pain). Remove & Discard patch within 12 hours or as directed by MD), Disp: 60 patch, Rfl: 0   metoprolol  succinate (TOPROL -XL) 25 MG 24 hr tablet, Take 25 mg by mouth daily., Disp: , Rfl:    pantoprazole  (PROTONIX ) 40  MG tablet, Take 1 tablet (40 mg total) by mouth 2 (two) times daily., Disp: 60 tablet, Rfl: 0   SYMBICORT 160-4.5 MCG/ACT inhaler, Inhale 2 puffs into the lungs in the morning and at bedtime., Disp: , Rfl:    temazepam  (RESTORIL ) 7.5 MG capsule, Take 1 capsule (7.5 mg total) by mouth at bedtime as needed for sleep., Disp: 30 capsule, Rfl: 0   tiZANidine (ZANAFLEX) 4 MG tablet, Take 4 mg by mouth at bedtime as needed., Disp: , Rfl:    TRUE METRIX BLOOD GLUCOSE TEST test strip, 1 each by Other route as needed for other., Disp: , Rfl:    TRUEplus Lancets 33G MISC, , Disp: , Rfl:    Vitamin D , Ergocalciferol , (DRISDOL ) 1.25 MG (50000 UNIT) CAPS capsule, Take 1 capsule (50,000 Units total) by mouth once a week., Disp: 5 capsule, Rfl: 0  Allergies:  Allergies  Allergen Reactions    Benadryl  [Diphenhydramine ] Itching and Other (See Comments)    Can only take dye free. States the dye causes itching.   Darvon [Propoxyphene] Itching   Hydrocodone  Bit-Homatrop Mbr Itching   Metformin Nausea And Vomiting   Pentazocine Other (See Comments)      jitters   Percocet [Oxycodone -Acetaminophen ] Itching   Sulfa Antibiotics Hives   Tetracaine Hcl Itching   Tetracyclines & Related Hives   Tramadol Itching   Aspirin Nausea And Vomiting   Other Other (See Comments)    Other reaction(s): Unknown. States reaction was to nasal spray that caused pupils to shrink    Past Medical History, Surgical history, Social history, and Family History were reviewed and updated.  Review of Systems: Review of Systems  Constitutional:  Positive for fatigue.  HENT:  Negative.    Eyes: Negative.   Respiratory: Negative.    Cardiovascular:  Positive for leg swelling.  Gastrointestinal: Negative.   Endocrine: Negative.   Genitourinary: Negative.    Musculoskeletal: Negative.   Skin: Negative.   Neurological: Negative.   Hematological: Negative.   Psychiatric/Behavioral: Negative.      Physical Exam:  weight is 149 lb 12.8 oz (67.9 kg). Her oral temperature is 97.7 F (36.5 C). Her blood pressure is 109/59 (abnormal) and her pulse is 68. Her respiration is 17 and oxygen saturation is 100%.   Wt Readings from Last 3 Encounters:  02/25/24 149 lb 12.8 oz (67.9 kg)  02/17/24 149 lb 3.2 oz (67.7 kg)  01/15/24 150 lb (68 kg)    Physical Exam Vitals reviewed.  HENT:     Head: Normocephalic and atraumatic.  Eyes:     Pupils: Pupils are equal, round, and reactive to light.  Cardiovascular:     Rate and Rhythm: Normal rate and regular rhythm.     Heart sounds: Normal heart sounds.  Pulmonary:     Effort: Pulmonary effort is normal.     Breath sounds: Normal breath sounds.  Abdominal:     General: Bowel sounds are normal.     Palpations: Abdomen is soft.  Musculoskeletal:         General: No tenderness or deformity. Normal range of motion.     Cervical back: Normal range of motion.  Lymphadenopathy:     Cervical: No cervical adenopathy.  Skin:    General: Skin is warm and dry.     Findings: No erythema or rash.  Neurological:     Mental Status: She is alert and oriented to person, place, and time.  Psychiatric:        Behavior:  Behavior normal.        Thought Content: Thought content normal.        Judgment: Judgment normal.     Lab Results  Component Value Date   WBC 6.1 02/25/2024   HGB 12.9 02/25/2024   HCT 39.6 02/25/2024   MCV 81.6 02/25/2024   PLT 292 02/25/2024     Chemistry      Component Value Date/Time   NA 142 01/15/2024 1218   NA 139 01/23/2022 0000   K 4.0 01/15/2024 1218   CL 106 01/15/2024 1218   CO2 25 01/15/2024 1218   BUN 25 (H) 01/15/2024 1218   BUN 23 (A) 01/23/2022 0000   CREATININE 2.19 (H) 01/15/2024 1218   CREATININE 1.14 (H) 03/06/2017 1502   GLU 138 01/23/2022 0000      Component Value Date/Time   CALCIUM  9.0 01/15/2024 1218   ALKPHOS 84 01/15/2024 1218   AST 13 (L) 01/15/2024 1218   ALT 9 01/15/2024 1218   BILITOT 0.5 01/15/2024 1218      Impression and Plan: Ms. Smithee is a very nice 73 year old Afro-American female.  She came in with a cardiac arrest.  She had a pulmonary embolism that was a saddle embolus.  She managed to get through this.  She had temporary renal failure.  She had temporary hepatic failure.  Thankfully, she was able to get off all of the supporting machines.  She still has an element of renal insufficiency.  I think she is on lifelong Eliquis .  I know she has a filter in.  However, she is smoking.  I think this is can be her biggest risk factor for thromboembolic disease.  I would like to probably see her back sometime in the Summer.  She was knows that she can come back to see me beforehand.  It is nice having fellowship with her.    Ivor Mars, MD 4/30/20253:40 PM

## 2024-02-26 LAB — LUPUS ANTICOAGULANT PANEL
DRVVT: 50.6 s — ABNORMAL HIGH (ref 0.0–47.0)
PTT Lupus Anticoagulant: 31.9 s (ref 0.0–43.5)

## 2024-02-26 LAB — DRVVT CONFIRM: dRVVT Confirm: 1 ratio (ref 0.8–1.2)

## 2024-02-26 LAB — DRVVT MIX: dRVVT Mix: 43.5 s — ABNORMAL HIGH (ref 0.0–40.4)

## 2024-02-27 LAB — CARDIOLIPIN ANTIBODIES, IGG, IGM, IGA
Anticardiolipin IgA: <9 U/mL (ref 0–11)
Anticardiolipin IgG: <9 GPL U/mL (ref 0–14)
Anticardiolipin IgM: 15 [MPL'U]/mL — ABNORMAL HIGH (ref 0–12)

## 2024-03-10 DIAGNOSIS — H25011 Cortical age-related cataract, right eye: Secondary | ICD-10-CM | POA: Diagnosis not present

## 2024-03-10 DIAGNOSIS — H43813 Vitreous degeneration, bilateral: Secondary | ICD-10-CM | POA: Diagnosis not present

## 2024-03-10 DIAGNOSIS — H2511 Age-related nuclear cataract, right eye: Secondary | ICD-10-CM | POA: Diagnosis not present

## 2024-03-10 DIAGNOSIS — E119 Type 2 diabetes mellitus without complications: Secondary | ICD-10-CM | POA: Diagnosis not present

## 2024-03-30 DIAGNOSIS — N1832 Chronic kidney disease, stage 3b: Secondary | ICD-10-CM | POA: Diagnosis not present

## 2024-03-30 DIAGNOSIS — E785 Hyperlipidemia, unspecified: Secondary | ICD-10-CM | POA: Diagnosis not present

## 2024-03-30 DIAGNOSIS — Z86711 Personal history of pulmonary embolism: Secondary | ICD-10-CM | POA: Diagnosis not present

## 2024-03-30 DIAGNOSIS — I129 Hypertensive chronic kidney disease with stage 1 through stage 4 chronic kidney disease, or unspecified chronic kidney disease: Secondary | ICD-10-CM | POA: Diagnosis not present

## 2024-03-30 DIAGNOSIS — E1122 Type 2 diabetes mellitus with diabetic chronic kidney disease: Secondary | ICD-10-CM | POA: Diagnosis not present

## 2024-04-06 ENCOUNTER — Ambulatory Visit: Admitting: Cardiology

## 2024-05-03 ENCOUNTER — Telehealth: Payer: Self-pay | Admitting: Podiatry

## 2024-05-03 NOTE — Telephone Encounter (Signed)
 Patient states she is need of another pair of diabetic shoe. I let her know we need to refer her. She wants to proceed with referral.

## 2024-05-07 ENCOUNTER — Other Ambulatory Visit: Payer: Self-pay | Admitting: Podiatry

## 2024-05-07 DIAGNOSIS — E1169 Type 2 diabetes mellitus with other specified complication: Secondary | ICD-10-CM

## 2024-05-07 DIAGNOSIS — M21371 Foot drop, right foot: Secondary | ICD-10-CM

## 2024-05-07 DIAGNOSIS — L84 Corns and callosities: Secondary | ICD-10-CM

## 2024-05-07 DIAGNOSIS — E1142 Type 2 diabetes mellitus with diabetic polyneuropathy: Secondary | ICD-10-CM

## 2024-05-07 NOTE — Telephone Encounter (Signed)
 Referral faxed 05/07/2024 [email or web address]

## 2024-05-18 ENCOUNTER — Other Ambulatory Visit: Payer: Self-pay | Admitting: Cardiology

## 2024-05-18 DIAGNOSIS — I82403 Acute embolism and thrombosis of unspecified deep veins of lower extremity, bilateral: Secondary | ICD-10-CM

## 2024-05-18 DIAGNOSIS — D6859 Other primary thrombophilia: Secondary | ICD-10-CM

## 2024-05-18 NOTE — Telephone Encounter (Signed)
 Prescription refill request for Eliquis  received. Indication: DVT Last office visit: 02/17/24  JINNY Bergamo MD Scr: 2.25 on 02/25/24  Epic Age: 73 Weight: 67.7kg  Per Dr Godfrey last office note Eliquis  refill should come from her PCP.

## 2024-05-21 ENCOUNTER — Ambulatory Visit: Admitting: Podiatry

## 2024-05-21 ENCOUNTER — Encounter: Payer: Self-pay | Admitting: Podiatry

## 2024-05-21 ENCOUNTER — Other Ambulatory Visit: Payer: Self-pay

## 2024-05-21 DIAGNOSIS — I82403 Acute embolism and thrombosis of unspecified deep veins of lower extremity, bilateral: Secondary | ICD-10-CM

## 2024-05-21 DIAGNOSIS — M79675 Pain in left toe(s): Secondary | ICD-10-CM | POA: Diagnosis not present

## 2024-05-21 DIAGNOSIS — D6859 Other primary thrombophilia: Secondary | ICD-10-CM

## 2024-05-21 DIAGNOSIS — B351 Tinea unguium: Secondary | ICD-10-CM

## 2024-05-21 DIAGNOSIS — E1169 Type 2 diabetes mellitus with other specified complication: Secondary | ICD-10-CM

## 2024-05-21 DIAGNOSIS — E1142 Type 2 diabetes mellitus with diabetic polyneuropathy: Secondary | ICD-10-CM | POA: Diagnosis not present

## 2024-05-21 DIAGNOSIS — Z794 Long term (current) use of insulin: Secondary | ICD-10-CM

## 2024-05-21 DIAGNOSIS — M79674 Pain in right toe(s): Secondary | ICD-10-CM | POA: Diagnosis not present

## 2024-05-21 DIAGNOSIS — M21371 Foot drop, right foot: Secondary | ICD-10-CM

## 2024-05-21 MED ORDER — APIXABAN 2.5 MG PO TABS: 2.5000 mg | ORAL_TABLET | Freq: Two times a day (BID) | ORAL | 1 refills | Status: DC

## 2024-05-21 NOTE — Telephone Encounter (Signed)
 Prescription refill request for Eliquis  received. Indication:dvt Last office visit:4/25 Scr:2.25  4/25 Age: 73 Weight:67.9  kg  Prescription refilled

## 2024-05-21 NOTE — Progress Notes (Addendum)
 This patient returns to my office for at risk foot care.  This patient requires this care by a professional since this patient will be at risk due to having CKD and diabetes and foot drop.  She requests an evaluation for type of bracing. This patient is unable to cut nails herself since the patient cannot reach her nails.These nails are painful walking and wearing shoes.  This patient presents for at risk foot care today.  General Appearance  Alert, conversant and in no acute stress.  Vascular  Dorsalis pedis and posterior tibial  pulses are palpable  bilaterally.  Capillary return is within normal limits  bilaterally. Temperature is within normal limits  bilaterally.  Neurologic   Foot drop right foot.    Nails Thick disfigured discolored nails with subungual debris  from hallux to fifth toes bilaterally. No evidence of bacterial infection or drainage bilaterally.  Orthopedic  No limitations of motion  feet .  No crepitus or effusions noted.  No bony pathology or digital deformities noted.  Skin  normotropic skin with no porokeratosis noted bilaterally.  No signs of infections or ulcers noted.     Onychomycosis  Pain in right toes  Pain in left toes  Consent was obtained for treatment procedures.   Mechanical debridement of nails 1-5  bilaterally performed with a nail nipper.  Filed with dremel without incident. To refer her to doctor for foot evaluation right. Refer to Dr.  Tobie for possible AFO brace.   Return office visit   3 months                   Told patient to return for periodic foot care and evaluation due to potential at risk complications.   Cordella Bold DPM

## 2024-05-28 ENCOUNTER — Inpatient Hospital Stay: Payer: Medicare PPO

## 2024-05-28 ENCOUNTER — Ambulatory Visit: Payer: Medicare PPO | Admitting: Hematology & Oncology

## 2024-06-02 ENCOUNTER — Ambulatory Visit: Admitting: Podiatry

## 2024-06-02 DIAGNOSIS — M21371 Foot drop, right foot: Secondary | ICD-10-CM | POA: Diagnosis not present

## 2024-06-02 NOTE — Progress Notes (Signed)
 Subjective:  Patient ID: [name], female    DOB: [birth year],  MRN: [id number]  Chief Complaint  Patient presents with   Foot Pain    Pt is wanting to get an AFO brace     73 y.o. female presents with the above complaint.  Patient presents with right dropfoot.  Patient states that this has been there for quite some time.  She wanted to get an AFO brace.  She currently does not have 1.  She denies seeing anyone else prior to seeing me denies any other acute complaints.  She would like to discuss treatment options for this.   Review of Systems: Negative except as noted in the HPI. Denies N/V/F/Ch.  Past Medical History:  Diagnosis Date   Anxiety    Arthritis    Asthma    Cardiac arrest (HCC) 10/28/2021   Depression    Diabetes mellitus without complication (HCC)    Fibromyalgia    GERD (gastroesophageal reflux disease)    Hyperlipemia    Hypertension    PONV (postoperative nausea and vomiting)     Current Outpatient Medications:    acetaminophen  (TYLENOL ) 325 MG tablet, Take 2 tablets (650 mg total) by mouth every 4 (four) hours as needed for fever (greater than 37.6 C). (Patient taking differently: Take 650 mg by mouth daily as needed (pain).), Disp: , Rfl:    albuterol  (VENTOLIN  HFA) 108 (90 Base) MCG/ACT inhaler, Inhale 2 puffs into the lungs every 4 (four) hours as needed for wheezing or shortness of breath., Disp: 8.5 g, Rfl: 0   ALPRAZolam  (XANAX ) 0.25 MG tablet, Take 0.25 mg by mouth daily as needed., Disp: , Rfl:    amLODipine  (NORVASC ) 10 MG tablet, Take 10 mg by mouth daily., Disp: , Rfl:    apixaban  (ELIQUIS ) 2.5 MG TABS tablet, Take 1 tablet (2.5 mg total) by mouth 2 (two) times daily., Disp: 180 tablet, Rfl: 1   atorvastatin  (LIPITOR) 20 MG tablet, Take 1 tablet (20 mg total) by mouth daily., Disp: 30 tablet, Rfl: 0   dicyclomine  (BENTYL ) 10 MG capsule, Take 1 capsule (10 mg total) by mouth 3 (three) times daily before meals. (Patient taking differently: Take 10  mg by mouth 4 (four) times daily -  before meals and at bedtime.), Disp: 90 capsule, Rfl: 0   escitalopram (LEXAPRO) 5 MG tablet, Take 5 mg by mouth daily., Disp: , Rfl:    FARXIGA 10 MG TABS tablet, Take 10 mg by mouth daily., Disp: , Rfl:    Ferrous Sulfate (IRON PO), Take 1 tablet by mouth daily., Disp: , Rfl:    gabapentin  (NEURONTIN ) 100 MG capsule, Take 100 mg by mouth 2 (two) times daily., Disp: , Rfl:    LANTUS SOLOSTAR 100 UNIT/ML Solostar Pen, Inject 20 Units into the skin daily., Disp: , Rfl:    lidocaine  (LIDODERM ) 5 %, Place 2 patches onto the skin daily. Remove & Discard patch within 12 hours or as directed by MD (Patient taking differently: Place 1 patch onto the skin daily as needed (pain). Remove & Discard patch within 12 hours or as directed by MD), Disp: 60 patch, Rfl: 0   metoprolol  succinate (TOPROL -XL) 25 MG 24 hr tablet, Take 25 mg by mouth daily., Disp: , Rfl:    pantoprazole  (PROTONIX ) 40 MG tablet, Take 1 tablet (40 mg total) by mouth 2 (two) times daily., Disp: 60 tablet, Rfl: 0   SYMBICORT 160-4.5 MCG/ACT inhaler, Inhale 2 puffs into the lungs in the morning  and at bedtime., Disp: , Rfl:    temazepam  (RESTORIL ) 7.5 MG capsule, Take 1 capsule (7.5 mg total) by mouth at bedtime as needed for sleep., Disp: 30 capsule, Rfl: 0   tiZANidine (ZANAFLEX) 4 MG tablet, Take 4 mg by mouth at bedtime as needed., Disp: , Rfl:    TRUE METRIX BLOOD GLUCOSE TEST test strip, 1 each by Other route as needed for other., Disp: , Rfl:    TRUEplus Lancets 33G MISC, , Disp: , Rfl:    Vitamin D , Ergocalciferol , (DRISDOL ) 1.25 MG (50000 UNIT) CAPS capsule, Take 1 capsule (50,000 Units total) by mouth once a week., Disp: 5 capsule, Rfl: 0  Social History   Tobacco Use  Smoking Status Former   Current packs/day: 0.00   Average packs/day: 0.5 packs/day for 20.0 years (10.0 ttl pk-yrs)   Types: Cigarettes   Start date: 08/10/1990   Quit date: 08/10/2010   Years since quitting: 13.8  Smokeless  Tobacco Never    Allergies  Allergen Reactions   Benadryl  [Diphenhydramine ] Itching and Other (See Comments)    Can only take dye free. States the dye causes itching.   Darvon [Propoxyphene] Itching   Hydrocodone  Bit-Homatrop Mbr Itching   Metformin Nausea And Vomiting   Pentazocine Other (See Comments)      jitters   Percocet [Oxycodone -Acetaminophen ] Itching   Sulfa Antibiotics Hives   Tetracaine Hcl Itching   Tetracyclines & Related Hives   Tramadol Itching   Aspirin Nausea And Vomiting   Other Other (See Comments)    Other reaction(s): Unknown. States reaction was to nasal spray that caused pupils to shrink   Objective:  There were no vitals filed for this visit. There is no height or weight on file to calculate BMI. Constitutional Well developed. Well nourished.  Vascular Dorsalis pedis pulses palpable bilaterally. Posterior tibial pulses palpable bilaterally. Capillary refill normal to all digits.  No cyanosis or clubbing noted. Pedal hair growth normal.  Neurologic Normal speech. Oriented to person, place, and time. Epicritic sensation to light touch grossly present bilaterally.  Dermatologic Nails well groomed and normal in appearance. No open wounds. No skin lesions.  Orthopedic: Weakness and extensor tendons strength noted to the right side.  3 out of 5 extensor tendon strength noted.  5 out of 5 flexor tendon strength noted.  4 out of 5 for peroneal tendon strength likely consistent with dropfoot steppage gait   Radiographs: None Assessment:   1. Foot drop, right foot    Plan:  Patient was evaluated and treated and all questions answered.  Right dropfoot - All questions and concerns were discussed with the patient in extensive detail given the amount of pain that she is experiencing she would benefit from AFO dropfoot brace.  I discussed with the patient she states understand - Should be scheduled see Trish for AFO bracing.  No follow-ups on file.

## 2024-06-04 ENCOUNTER — Telehealth: Payer: Self-pay | Admitting: Hematology & Oncology

## 2024-06-04 NOTE — Telephone Encounter (Signed)
 [name] called to see if she can transfer care here for labs due to transportation issues.

## 2024-06-08 ENCOUNTER — Telehealth: Payer: Self-pay | Admitting: Hematology & Oncology

## 2024-06-08 DIAGNOSIS — F39 Unspecified mood [affective] disorder: Secondary | ICD-10-CM | POA: Diagnosis not present

## 2024-06-08 DIAGNOSIS — F321 Major depressive disorder, single episode, moderate: Secondary | ICD-10-CM | POA: Diagnosis not present

## 2024-06-08 DIAGNOSIS — F419 Anxiety disorder, unspecified: Secondary | ICD-10-CM | POA: Diagnosis not present

## 2024-06-08 NOTE — Telephone Encounter (Signed)
 [name] has scheduled her Lab appointment at [facility] due to transportation issues.

## 2024-06-09 ENCOUNTER — Other Ambulatory Visit: Payer: Self-pay

## 2024-06-09 DIAGNOSIS — I2602 Saddle embolus of pulmonary artery with acute cor pulmonale: Secondary | ICD-10-CM

## 2024-06-09 NOTE — Progress Notes (Signed)
 Pt requested transfer to [facility], as she has transportation issues. Referral send to Dr.Dorsey per Dr.Ennever.

## 2024-06-10 DIAGNOSIS — R4189 Other symptoms and signs involving cognitive functions and awareness: Secondary | ICD-10-CM | POA: Diagnosis not present

## 2024-06-14 ENCOUNTER — Inpatient Hospital Stay: Attending: Hematology & Oncology

## 2024-06-14 DIAGNOSIS — I2602 Saddle embolus of pulmonary artery with acute cor pulmonale: Secondary | ICD-10-CM

## 2024-06-14 DIAGNOSIS — Z79899 Other long term (current) drug therapy: Secondary | ICD-10-CM | POA: Insufficient documentation

## 2024-06-14 DIAGNOSIS — Z7901 Long term (current) use of anticoagulants: Secondary | ICD-10-CM | POA: Diagnosis not present

## 2024-06-14 DIAGNOSIS — Z86711 Personal history of pulmonary embolism: Secondary | ICD-10-CM | POA: Insufficient documentation

## 2024-06-14 LAB — CBC WITH DIFFERENTIAL (CANCER CENTER ONLY)
Abs Immature Granulocytes: 0.01 K/uL (ref 0.00–0.07)
Basophils Absolute: 0.1 K/uL (ref 0.0–0.1)
Basophils Relative: 1 %
Eosinophils Absolute: 0.1 K/uL (ref 0.0–0.5)
Eosinophils Relative: 2 %
HCT: 40.5 % (ref 36.0–46.0)
Hemoglobin: 13.4 g/dL (ref 12.0–15.0)
Immature Granulocytes: 0 %
Lymphocytes Relative: 33 %
Lymphs Abs: 2.2 K/uL (ref 0.7–4.0)
MCH: 26.7 pg (ref 26.0–34.0)
MCHC: 33.1 g/dL (ref 30.0–36.0)
MCV: 80.7 fL (ref 80.0–100.0)
Monocytes Absolute: 0.7 K/uL (ref 0.1–1.0)
Monocytes Relative: 10 %
Neutro Abs: 3.6 K/uL (ref 1.7–7.7)
Neutrophils Relative %: 54 %
Platelet Count: 321 K/uL (ref 150–400)
RBC: 5.02 MIL/uL (ref 3.87–5.11)
RDW: 13.9 % (ref 11.5–15.5)
WBC Count: 6.7 K/uL (ref 4.0–10.5)
nRBC: 0 % (ref 0.0–0.2)

## 2024-06-14 LAB — SAVE SMEAR(SSMR), FOR PROVIDER SLIDE REVIEW

## 2024-06-14 LAB — CMP (CANCER CENTER ONLY)
ALT: 14 U/L (ref 0–44)
AST: 17 U/L (ref 15–41)
Albumin: 4.4 g/dL (ref 3.5–5.0)
Alkaline Phosphatase: 90 U/L (ref 38–126)
Anion gap: 7 (ref 5–15)
BUN: 34 mg/dL — ABNORMAL HIGH (ref 8–23)
CO2: 26 mmol/L (ref 22–32)
Calcium: 9.1 mg/dL (ref 8.9–10.3)
Chloride: 108 mmol/L (ref 98–111)
Creatinine: 2.2 mg/dL — ABNORMAL HIGH (ref 0.44–1.00)
GFR, Estimated: 23 mL/min — ABNORMAL LOW (ref >60)
Glucose, Bld: 86 mg/dL (ref 70–99)
Potassium: 4.3 mmol/L (ref 3.5–5.1)
Sodium: 141 mmol/L (ref 135–145)
Total Bilirubin: 0.4 mg/dL (ref 0.0–1.2)
Total Protein: 7.5 g/dL (ref 6.5–8.1)

## 2024-06-15 ENCOUNTER — Other Ambulatory Visit (HOSPITAL_COMMUNITY): Payer: Self-pay

## 2024-06-15 DIAGNOSIS — G629 Polyneuropathy, unspecified: Secondary | ICD-10-CM | POA: Diagnosis not present

## 2024-06-16 LAB — CARDIOLIPIN ANTIBODIES, IGG, IGM, IGA
Anticardiolipin IgA: <9 U/mL (ref 0–11)
Anticardiolipin IgG: <9 GPL U/mL (ref 0–14)
Anticardiolipin IgM: <9 [MPL'U]/mL (ref 0–12)

## 2024-06-17 ENCOUNTER — Ambulatory Visit

## 2024-06-17 DIAGNOSIS — M21371 Foot drop, right foot: Secondary | ICD-10-CM

## 2024-06-17 DIAGNOSIS — E1142 Type 2 diabetes mellitus with diabetic polyneuropathy: Secondary | ICD-10-CM

## 2024-06-17 LAB — LUPUS ANTICOAGULANT PANEL
DRVVT: 44.4 s (ref 0.0–47.0)
PTT Lupus Anticoagulant: 42.6 s (ref 0.0–43.5)

## 2024-06-17 NOTE — Progress Notes (Signed)
 Right walk on AFO ordered for patient [name] required [id number] Patient was also given paperwork to take to DTD once signed patient will pick out shoes / heat moldable inserts will fit better with AFO  AFO order placed as it is OTS will submit to [employer] for prior auth

## 2024-06-30 ENCOUNTER — Inpatient Hospital Stay

## 2024-06-30 ENCOUNTER — Inpatient Hospital Stay: Attending: Hematology and Oncology | Admitting: Hematology and Oncology

## 2024-06-30 ENCOUNTER — Ambulatory Visit: Admitting: Hematology & Oncology

## 2024-06-30 VITALS — BP 136/64 | HR 79 | Temp 98.7°F | Resp 15 | Wt 158.9 lb

## 2024-06-30 DIAGNOSIS — N184 Chronic kidney disease, stage 4 (severe): Secondary | ICD-10-CM | POA: Diagnosis not present

## 2024-06-30 DIAGNOSIS — Z87891 Personal history of nicotine dependence: Secondary | ICD-10-CM | POA: Insufficient documentation

## 2024-06-30 DIAGNOSIS — N182 Chronic kidney disease, stage 2 (mild): Secondary | ICD-10-CM

## 2024-06-30 DIAGNOSIS — I2602 Saddle embolus of pulmonary artery with acute cor pulmonale: Secondary | ICD-10-CM

## 2024-06-30 DIAGNOSIS — Z86711 Personal history of pulmonary embolism: Secondary | ICD-10-CM | POA: Insufficient documentation

## 2024-06-30 DIAGNOSIS — Z7901 Long term (current) use of anticoagulants: Secondary | ICD-10-CM | POA: Diagnosis not present

## 2024-06-30 NOTE — Progress Notes (Signed)
 [facility] [phone]   [phone]  PROGRESS NOTE  Patient Care Team: Ransom Other, MD as PCP - General (Internal Medicine) Ladona Heinz, MD as PCP - Cardiology (Cardiology)  Hematological/Oncological History # Saddle Pulmonary Embolus  06/08/2022: IVC Filter placed 02/25/2024: Last clinic visit with Dr. Timmy. 06/30/2024: transfer care to Dr. Federico due to transportation/geographic issues  Interval History:  [name] 73 y.o. female with medical history significant for saddle pulmonary embolus who presents for a follow up visit. The patient's last visit was on 02/25/2024 with Dr. Timmy.. In the interim since the last visit she has had no major changes in her health.  On exam today Ms. Helming reports that she is tolerating Eliquis  therapy well.  She is currently taking 2.5 mg twice daily.  She notes that she is transferring her care here because she wants to be closer to home and she does have difficulty transporting back-and-forth to Dr. Jessy office.  She reports that she does have some co-pay for her Eliquis  and it cost her $40 for 90-day supply.  She reports she has no signs or symptoms concerning for recurrent blood clot such as leg pain, leg swelling, chest pain, or shortness of breath.  She reports that she did have an episode where she had blood in her stool and had to go to the emergency department.  She reports that she has not had any recent signs or symptoms concerning for bleeding.  She notes that she also does have some issues with fibromyalgia.  Otherwise she has a baseline level of health and denies any fevers, chills, sweats, nausea, vomiting or diarrhea.  Full 10 point ROS is otherwise negative.  MEDICAL HISTORY:  Past Medical History:  Diagnosis Date   Anxiety    Arthritis    Asthma    Cardiac arrest (HCC) 10/28/2021   Depression    Diabetes mellitus without complication (HCC)    Fibromyalgia    GERD (gastroesophageal  reflux disease)    Hyperlipemia    Hypertension    PONV (postoperative nausea and vomiting)     SURGICAL HISTORY: Past Surgical History:  Procedure Laterality Date   COLONOSCOPY     COLONOSCOPY WITH PROPOFOL  N/A 02/20/2022   Procedure: COLONOSCOPY WITH PROPOFOL ;  Surgeon: Elicia Claw, MD;  Location: [facility] ENDOSCOPY;  Service: Gastroenterology;  Laterality: N/A;   COLONOSCOPY WITH PROPOFOL  N/A 06/06/2022   Procedure: COLONOSCOPY WITH PROPOFOL ;  Surgeon: Burnette Fallow, MD;  Location: [facility] ENDOSCOPY;  Service: Gastroenterology;  Laterality: N/A;   DILATION AND CURETTAGE OF UTERUS     ESOPHAGOGASTRODUODENOSCOPY N/A 02/20/2022   Procedure: ESOPHAGOGASTRODUODENOSCOPY (EGD);  Surgeon: Elicia Claw, MD;  Location: [facility] ENDOSCOPY;  Service: Gastroenterology;  Laterality: N/A;   IR EMBO ART  VEN HEMORR LYMPH EXTRAV  INC GUIDE ROADMAPPING  11/01/2021   IR FLUORO GUIDE CV LINE RIGHT  11/20/2021   IR IVC FILTER PLMT / S&I /IMG GUID/MOD SED  06/08/2022   IR REMOVAL TUN CV CATH W/O FL  12/20/2021   IR THROMBECT PRIM MECH INIT (INCLU) MOD SED  10/29/2021   IR US  GUIDE VASC ACCESS RIGHT  10/29/2021   IR US  GUIDE VASC ACCESS RIGHT  11/01/2021   IR US  GUIDE VASC ACCESS RIGHT  11/21/2021   KNEE ARTHROSCOPY  [id number]   left   POLYPECTOMY  02/20/2022   Procedure: POLYPECTOMY;  Surgeon: Elicia Claw, MD;  Location: [facility] ENDOSCOPY;  Service: Gastroenterology;;   SHOULDER ARTHROSCOPY     left   TRIGGER  FINGER RELEASE  08/13/2012   Procedure: RELEASE TRIGGER FINGER/A-1 PULLEY;  Surgeon: Lamar LULLA Leonor Mickey., MD;  Location: [facility];  Service: Orthopedics;  Laterality: Right;  long finger   TUBAL LIGATION      SOCIAL HISTORY: Social History   Socioeconomic History   Marital status: Divorced    Spouse name: Not on file   Number of children: Not on file   Years of education: Not on file   Highest education level: Not on file  Occupational History   Not on file  Tobacco Use   Smoking  status: Former    Current packs/day: 0.00    Average packs/day: 0.5 packs/day for 20.0 years (10.0 ttl pk-yrs)    Types: Cigarettes    Start date: 08/10/1990    Quit date: 08/10/2010    Years since quitting: 13.9   Smokeless tobacco: Never  Vaping Use   Vaping status: Never Used  Substance and Sexual Activity   Alcohol use: No   Drug use: No   Sexual activity: Not Currently  Other Topics Concern   Not on file  Social History Narrative   Not on file   Social Drivers of Health   Financial Resource Strain: Not on file  Food Insecurity: Food Insecurity Present (06/30/2024)   Hunger Vital Sign    Worried About Running Out of Food in the Last Year: Sometimes true    Ran Out of Food in the Last Year: Sometimes true  Transportation Needs: No Transportation Needs (06/30/2024)   PRAPARE - Administrator, Civil Service (Medical): No    Lack of Transportation (Non-Medical): No  Recent Concern: Transportation Needs - Unmet Transportation Needs (06/30/2024)   PRAPARE - Administrator, Civil Service (Medical): Yes    Lack of Transportation (Non-Medical): No  Physical Activity: Insufficiently Active (02/13/2023)   Exercise Vital Sign    Days of Exercise per Week: 5 days    Minutes of Exercise per Session: 10 min  Stress: Not on file  Social Connections: Unknown (06/28/2023)   Received from [facility]   Social Network    Social Network: Not on file  Intimate Partner Violence: Not At Risk (06/30/2024)   Humiliation, Afraid, Rape, and Kick questionnaire    Fear of Current or Ex-Partner: No    Emotionally Abused: No    Physically Abused: No    Sexually Abused: No    FAMILY HISTORY: Family History  Problem Relation Age of Onset   Diabetes Mother    Heart attack Father    Diabetes Sister    Throat cancer Brother    Hypertension Daughter     ALLERGIES:  is allergic to benadryl  [diphenhydramine ], darvon [propoxyphene], hydrocodone  bit-homatrop mbr, metformin,  pentazocine, percocet [oxycodone -acetaminophen ], sulfa antibiotics, tetracaine hcl, tetracyclines & related, tramadol, aspirin, and other.  MEDICATIONS:  Current Outpatient Medications  Medication Sig Dispense Refill   gabapentin  (NEURONTIN ) 100 MG capsule Take 100 mg by mouth 2 (two) times daily. (Patient taking differently: Take 300 mg by mouth 2 (two) times daily.)     acetaminophen  (TYLENOL ) 325 MG tablet Take 2 tablets (650 mg total) by mouth every 4 (four) hours as needed for fever (greater than 37.6 C). (Patient taking differently: Take 650 mg by mouth daily as needed (pain).)     albuterol  (VENTOLIN  HFA) 108 (90 Base) MCG/ACT inhaler Inhale 2 puffs into the lungs every 4 (four) hours as needed for wheezing or shortness of breath. 8.5 g 0  ALPRAZolam  (XANAX ) 0.25 MG tablet Take 0.25 mg by mouth daily as needed.     amLODipine  (NORVASC ) 10 MG tablet Take 10 mg by mouth daily.     apixaban  (ELIQUIS ) 2.5 MG TABS tablet Take 1 tablet (2.5 mg total) by mouth 2 (two) times daily. 180 tablet 1   atorvastatin  (LIPITOR) 20 MG tablet Take 1 tablet (20 mg total) by mouth daily. 30 tablet 0   dicyclomine  (BENTYL ) 10 MG capsule Take 1 capsule (10 mg total) by mouth 3 (three) times daily before meals. (Patient taking differently: Take 10 mg by mouth 4 (four) times daily -  before meals and at bedtime.) 90 capsule 0   escitalopram (LEXAPRO) 5 MG tablet Take 5 mg by mouth daily.     FARXIGA 10 MG TABS tablet Take 10 mg by mouth daily.     Ferrous Sulfate (IRON PO) Take 1 tablet by mouth daily.     LANTUS SOLOSTAR 100 UNIT/ML Solostar Pen Inject 20 Units into the skin daily.     lidocaine  (LIDODERM ) 5 % Place 2 patches onto the skin daily. Remove & Discard patch within 12 hours or as directed by MD (Patient taking differently: Place 1 patch onto the skin daily as needed (pain). Remove & Discard patch within 12 hours or as directed by MD) 60 patch 0   metoprolol  succinate (TOPROL -XL) 25 MG 24 hr tablet Take  25 mg by mouth daily.     pantoprazole  (PROTONIX ) 40 MG tablet Take 1 tablet (40 mg total) by mouth 2 (two) times daily. 60 tablet 0   SYMBICORT 160-4.5 MCG/ACT inhaler Inhale 2 puffs into the lungs in the morning and at bedtime.     temazepam  (RESTORIL ) 7.5 MG capsule Take 1 capsule (7.5 mg total) by mouth at bedtime as needed for sleep. 30 capsule 0   tiZANidine (ZANAFLEX) 4 MG tablet Take 4 mg by mouth at bedtime as needed.     TRUE METRIX BLOOD GLUCOSE TEST test strip 1 each by Other route as needed for other.     TRUEplus Lancets 33G MISC      Vitamin D , Ergocalciferol , (DRISDOL ) 1.25 MG (50000 UNIT) CAPS capsule Take 1 capsule (50,000 Units total) by mouth once a week. 5 capsule 0   No current facility-administered medications for this visit.    REVIEW OF SYSTEMS:   Constitutional: ( - ) fevers, ( - )  chills , ( - ) night sweats Eyes: ( - ) blurriness of vision, ( - ) double vision, ( - ) watery eyes Ears, nose, mouth, throat, and face: ( - ) mucositis, ( - ) sore throat Respiratory: ( - ) cough, ( - ) dyspnea, ( - ) wheezes Cardiovascular: ( - ) palpitation, ( - ) chest discomfort, ( - ) lower extremity swelling Gastrointestinal:  ( - ) nausea, ( - ) heartburn, ( - ) change in bowel habits Skin: ( - ) abnormal skin rashes Lymphatics: ( - ) new lymphadenopathy, ( - ) easy bruising Neurological: ( - ) numbness, ( - ) tingling, ( - ) new weaknesses Behavioral/Psych: ( - ) mood change, ( - ) new changes  All other systems were reviewed with the patient and are negative.  PHYSICAL EXAMINATION: ECOG PERFORMANCE STATUS: 0 - Asymptomatic  Vitals:   06/30/24 1447  BP: 136/64  Pulse: 79  Resp: 15  Temp: 98.7 F (37.1 C)  SpO2: 97%   Filed Weights   06/30/24 1447  Weight: 158 lb 14.4 oz (  72.1 kg)    GENERAL: Well-appearing elderly African-American female, alert, no distress and comfortable SKIN: skin color, texture, turgor are normal, no rashes or significant lesions EYES:  conjunctiva are pink and non-injected, sclera clear LUNGS: clear to auscultation and percussion with normal breathing effort HEART: regular rate & rhythm and no murmurs and no lower extremity edema Musculoskeletal: no cyanosis of digits and no clubbing  PSYCH: alert & oriented x 3, fluent speech NEURO: no focal motor/sensory deficits  LABORATORY DATA:  I have reviewed the data as listed    Latest Ref Rng & Units 06/14/2024    1:10 PM 02/25/2024    3:03 PM 01/15/2024   12:18 PM  CBC  WBC 4.0 - 10.5 K/uL 6.7  6.1  6.9   Hemoglobin 12.0 - 15.0 g/dL 86.5  87.0  86.8   Hematocrit 36.0 - 46.0 % 40.5  39.6  41.1   Platelets 150 - 400 K/uL 321  292  307        Latest Ref Rng & Units 06/14/2024    1:10 PM 02/25/2024    3:03 PM 01/15/2024   12:18 PM  CMP  Glucose 70 - 99 mg/dL 86  846  843   BUN 8 - 23 mg/dL 34  34  25   Creatinine 0.44 - 1.00 mg/dL 7.79  7.74  7.80   Sodium 135 - 145 mmol/L 141  142  142   Potassium 3.5 - 5.1 mmol/L 4.3  4.6  4.0   Chloride 98 - 111 mmol/L 108  107  106   CO2 22 - 32 mmol/L 26  29  25    Calcium  8.9 - 10.3 mg/dL 9.1  9.3  9.0   Total Protein 6.5 - 8.1 g/dL 7.5  7.2  7.1   Total Bilirubin 0.0 - 1.2 mg/dL 0.4  0.3  0.5   Alkaline Phos 38 - 126 U/L 90  83  84   AST 15 - 41 U/L 17  16  13    ALT 0 - 44 U/L 14  12  9     RADIOGRAPHIC STUDIES: No results found.  ASSESSMENT & PLAN REAGAN BEHLKE 73 y.o. female with medical history significant for saddle pulmonary embolus who presents for a follow up visit.  A provoked venous thromboembolism (VTE) is one that has a clear inciting factor or event. Provoking factors include prolonged travel/immobility, surgery (particular abdominal or orthropedic), trauma,  and pregnancy/ estrogen containing birth control. After a detailed history and review of the records there is no clear provoking factor for this patient's VTE.  Patients with unprovoked VTEs have up to 25% recurrence after 5 years and 36% at 10 years, with 4%  of these clots being fatal (BMJ?2019;366:l4363). Therefore the formal recommendation for unprovoked VTE's is lifelong anticoagulation, as the cause may not be transient or reversible. We recommend 6 months or full strength anticoagulation with a re-evaluation after that time.  The patient's will then have a choice of maintenance dose DOAC (preferred, recommended), 81mg  ASA PO daily (non-preferred), or no further anticoagulation (not recommended).    #Unprovoked DVT/Pulmonary Embolism  --findings at this time are consistent with a unprovoked VTE  --will order baseline CMP and CBC to assure labs are adequate for DOAC therapy  --Lab findings not consistent with APS, okay to continue Eliquis  therapy at this time. --recommend the patient continue eliquis  2.5 mg BID indefinitely.  --patient denies any bleeding, bruising, or dark stools on this medication. It is well tolerated. No difficulties  accessing/affording the medication  --RTC in 6 months' time with strict return precautions for overt signs of bleeding.     No orders of the defined types were placed in this encounter.   All questions were answered. The patient knows to call the clinic with any problems, questions or concerns.  A total of more than 40 minutes were spent on this encounter with face-to-face time and non-face-to-face time, including preparing to see the patient, ordering tests and/or medications, counseling the patient and coordination of care as outlined above.   Norleen IVAR Kidney, MD Department of Hematology/Oncology [facility] Phone: [phone] Pager: [phone] Email: [email or web address]  07/04/2024 4:20 PM

## 2024-07-12 NOTE — Progress Notes (Signed)
 Prior auth request submitted for [id number] Walk on AFO  [name] CPed, CFo, CFm

## 2024-07-14 ENCOUNTER — Telehealth: Payer: Self-pay

## 2024-07-14 NOTE — Telephone Encounter (Signed)
 Prior auth rcvd and approved;   [facility]ara #[id number]  07/12/24-10/11/24  Indexed in media

## 2024-07-26 NOTE — Telephone Encounter (Signed)
 Patient is inquiring about the status of the brace--whether it has arrived or what the next step is. I informed her that the prior authorization was approved on 07/14/2024. Please notify the patient at your earliest convenience. Thank you.

## 2024-07-30 ENCOUNTER — Ambulatory Visit: Admitting: Podiatry

## 2024-07-30 DIAGNOSIS — L600 Ingrowing nail: Secondary | ICD-10-CM | POA: Diagnosis not present

## 2024-07-30 NOTE — Progress Notes (Signed)
 Subjective:  Patient ID: [name], female    DOB: [birth year],  MRN: [id number]  Chief Complaint  Patient presents with   Nail Problem    73 y.o. female presents with the above complaint.  Patient presents with right hallux medial border ingrown painful to touch is progressing and worse worse with ambulation and shoe pressure she would like to have it removed she has not seen anyone as part of same he denies any other acute complaints no infection noted she is a diabetic.   Review of Systems: Negative except as noted in the HPI. Denies N/V/F/Ch.  Past Medical History:  Diagnosis Date   Anxiety    Arthritis    Asthma    Cardiac arrest (HCC) 10/28/2021   Depression    Diabetes mellitus without complication (HCC)    Fibromyalgia    GERD (gastroesophageal reflux disease)    Hyperlipemia    Hypertension    PONV (postoperative nausea and vomiting)     Current Outpatient Medications:    acetaminophen  (TYLENOL ) 325 MG tablet, Take 2 tablets (650 mg total) by mouth every 4 (four) hours as needed for fever (greater than 37.6 C). (Patient taking differently: Take 650 mg by mouth daily as needed (pain).), Disp: , Rfl:    albuterol  (VENTOLIN  HFA) 108 (90 Base) MCG/ACT inhaler, Inhale 2 puffs into the lungs every 4 (four) hours as needed for wheezing or shortness of breath., Disp: 8.5 g, Rfl: 0   ALPRAZolam  (XANAX ) 0.25 MG tablet, Take 0.25 mg by mouth daily as needed., Disp: , Rfl:    amLODipine  (NORVASC ) 10 MG tablet, Take 10 mg by mouth daily., Disp: , Rfl:    apixaban  (ELIQUIS ) 2.5 MG TABS tablet, Take 1 tablet (2.5 mg total) by mouth 2 (two) times daily., Disp: 180 tablet, Rfl: 1   atorvastatin  (LIPITOR) 20 MG tablet, Take 1 tablet (20 mg total) by mouth daily., Disp: 30 tablet, Rfl: 0   dicyclomine  (BENTYL ) 10 MG capsule, Take 1 capsule (10 mg total) by mouth 3 (three) times daily before meals. (Patient taking differently: Take 10 mg by mouth 4 (four) times daily -  before meals and  at bedtime.), Disp: 90 capsule, Rfl: 0   escitalopram (LEXAPRO) 5 MG tablet, Take 5 mg by mouth daily., Disp: , Rfl:    FARXIGA 10 MG TABS tablet, Take 10 mg by mouth daily., Disp: , Rfl:    Ferrous Sulfate (IRON PO), Take 1 tablet by mouth daily., Disp: , Rfl:    gabapentin  (NEURONTIN ) 100 MG capsule, Take 100 mg by mouth 2 (two) times daily. (Patient taking differently: Take 300 mg by mouth 2 (two) times daily.), Disp: , Rfl:    LANTUS SOLOSTAR 100 UNIT/ML Solostar Pen, Inject 20 Units into the skin daily., Disp: , Rfl:    lidocaine  (LIDODERM ) 5 %, Place 2 patches onto the skin daily. Remove & Discard patch within 12 hours or as directed by MD (Patient taking differently: Place 1 patch onto the skin daily as needed (pain). Remove & Discard patch within 12 hours or as directed by MD), Disp: 60 patch, Rfl: 0   metoprolol  succinate (TOPROL -XL) 25 MG 24 hr tablet, Take 25 mg by mouth daily., Disp: , Rfl:    pantoprazole  (PROTONIX ) 40 MG tablet, Take 1 tablet (40 mg total) by mouth 2 (two) times daily., Disp: 60 tablet, Rfl: 0   SYMBICORT 160-4.5 MCG/ACT inhaler, Inhale 2 puffs into the lungs in the morning and at bedtime., Disp: , Rfl:  temazepam  (RESTORIL ) 7.5 MG capsule, Take 1 capsule (7.5 mg total) by mouth at bedtime as needed for sleep., Disp: 30 capsule, Rfl: 0   tiZANidine (ZANAFLEX) 4 MG tablet, Take 4 mg by mouth at bedtime as needed., Disp: , Rfl:    TRUE METRIX BLOOD GLUCOSE TEST test strip, 1 each by Other route as needed for other., Disp: , Rfl:    TRUEplus Lancets 33G MISC, , Disp: , Rfl:    Vitamin D , Ergocalciferol , (DRISDOL ) 1.25 MG (50000 UNIT) CAPS capsule, Take 1 capsule (50,000 Units total) by mouth once a week., Disp: 5 capsule, Rfl: 0  Social History   Tobacco Use  Smoking Status Former   Current packs/day: 0.00   Average packs/day: 0.5 packs/day for 20.0 years (10.0 ttl pk-yrs)   Types: Cigarettes   Start date: 08/10/1990   Quit date: 08/10/2010   Years since  quitting: 13.9  Smokeless Tobacco Never    Allergies  Allergen Reactions   Benadryl  [Diphenhydramine ] Itching and Other (See Comments)    Can only take dye free. States the dye causes itching.   Darvon [Propoxyphene] Itching   Hydrocodone  Bit-Homatrop Mbr Itching   Metformin Nausea And Vomiting   Pentazocine Other (See Comments)      jitters   Percocet [Oxycodone -Acetaminophen ] Itching   Sulfa Antibiotics Hives   Tetracaine Hcl Itching   Tetracyclines & Related Hives   Tramadol Itching   Aspirin Nausea And Vomiting   Other Other (See Comments)    Other reaction(s): Unknown. States reaction was to nasal spray that caused pupils to shrink   Objective:  There were no vitals filed for this visit. There is no height or weight on file to calculate BMI. Constitutional Well developed. Well nourished.  Vascular Dorsalis pedis pulses palpable bilaterally. Posterior tibial pulses palpable bilaterally. Capillary refill normal to all digits.  No cyanosis or clubbing noted. Pedal hair growth normal.  Neurologic Normal speech. Oriented to person, place, and time. Epicritic sensation to light touch grossly present bilaterally.  Dermatologic Painful ingrowing nail at medial nail borders of the hallux nail right. No other open wounds. No skin lesions.  Orthopedic: Normal joint ROM without pain or crepitus bilaterally. No visible deformities. No bony tenderness.   Radiographs: None Assessment:   1. Ingrown toenail of right foot    Plan:  Patient was evaluated and treated and all questions answered.  Ingrown Nail, right -Patient elects to proceed with minor surgery to remove ingrown toenail removal today. Consent reviewed and signed by patient. -Ingrown nail excised. See procedure note. -Educated on post-procedure care including soaking. Written instructions provided and reviewed. -Patient to follow up in 2 weeks for nail check.  Procedure: Excision of Ingrown Toenail Location:  Right 1st toe medial nail borders. Anesthesia: Lidocaine  1% plain; 1.5 mL and Marcaine 0.5% plain; 1.5 mL, digital block. Skin Prep: Betadine. Dressing: Silvadene; telfa; dry, sterile, compression dressing. Technique: Following skin prep, the toe was exsanguinated and a tourniquet was secured at the base of the toe. The affected nail border was freed, split with a nail splitter, and excised. Chemical matrixectomy was then performed with phenol and irrigated out with alcohol. The tourniquet was then removed and sterile dressing applied. Disposition: Patient tolerated procedure well. Patient to return in 2 weeks for follow-up.   No follow-ups on file.

## 2024-08-02 ENCOUNTER — Ambulatory Visit (INDEPENDENT_AMBULATORY_CARE_PROVIDER_SITE_OTHER)

## 2024-08-02 DIAGNOSIS — E1142 Type 2 diabetes mellitus with diabetic polyneuropathy: Secondary | ICD-10-CM | POA: Diagnosis not present

## 2024-08-02 DIAGNOSIS — M2141 Flat foot [pes planus] (acquired), right foot: Secondary | ICD-10-CM | POA: Diagnosis not present

## 2024-08-02 DIAGNOSIS — M2142 Flat foot [pes planus] (acquired), left foot: Secondary | ICD-10-CM | POA: Diagnosis not present

## 2024-08-02 DIAGNOSIS — M21371 Foot drop, right foot: Secondary | ICD-10-CM | POA: Diagnosis not present

## 2024-08-03 NOTE — Patient Instructions (Signed)
 [name]

## 2024-08-03 NOTE — Progress Notes (Signed)
 Patient presents today to pick up AFO for right ankle and foot / foot drop  Orthosis was dispensed and fit was good patient was very happy with fit function. Brace adds support and stability to ankle and increases propulsion and toe off  this will reduce risk of fall and reduce muscle atrophy as well Patient reviewed with me instructions for break-in and wear. Written instructions given to patient.  Patient will follow up as needed.   [name] Cped, CFo, CFm  DM shoe ppw received from Dr. Lucien safestep to see if 9.5 WD is avail in black [place].  Case number [id number] per Jereld 10Wd is only sz avail in the black and no other new balance are avail.    [name] Cped, CFo, CFm

## 2024-08-19 ENCOUNTER — Other Ambulatory Visit: Payer: Self-pay | Admitting: Internal Medicine

## 2024-08-19 DIAGNOSIS — E7800 Pure hypercholesterolemia, unspecified: Secondary | ICD-10-CM | POA: Diagnosis not present

## 2024-08-19 DIAGNOSIS — D6869 Other thrombophilia: Secondary | ICD-10-CM | POA: Diagnosis not present

## 2024-08-19 DIAGNOSIS — Z1231 Encounter for screening mammogram for malignant neoplasm of breast: Secondary | ICD-10-CM

## 2024-08-19 DIAGNOSIS — F419 Anxiety disorder, unspecified: Secondary | ICD-10-CM | POA: Diagnosis not present

## 2024-08-19 DIAGNOSIS — E1121 Type 2 diabetes mellitus with diabetic nephropathy: Secondary | ICD-10-CM | POA: Diagnosis not present

## 2024-08-19 DIAGNOSIS — Z0000 Encounter for general adult medical examination without abnormal findings: Secondary | ICD-10-CM | POA: Diagnosis not present

## 2024-08-19 DIAGNOSIS — I10 Essential (primary) hypertension: Secondary | ICD-10-CM | POA: Diagnosis not present

## 2024-08-19 DIAGNOSIS — E559 Vitamin D deficiency, unspecified: Secondary | ICD-10-CM | POA: Diagnosis not present

## 2024-08-19 DIAGNOSIS — E1142 Type 2 diabetes mellitus with diabetic polyneuropathy: Secondary | ICD-10-CM | POA: Diagnosis not present

## 2024-08-19 DIAGNOSIS — F331 Major depressive disorder, recurrent, moderate: Secondary | ICD-10-CM | POA: Diagnosis not present

## 2024-08-19 DIAGNOSIS — I890 Lymphedema, not elsewhere classified: Secondary | ICD-10-CM | POA: Diagnosis not present

## 2024-08-23 ENCOUNTER — Ambulatory Visit: Admitting: Podiatry

## 2024-08-23 ENCOUNTER — Encounter: Payer: Self-pay | Admitting: Podiatry

## 2024-08-23 DIAGNOSIS — Z794 Long term (current) use of insulin: Secondary | ICD-10-CM | POA: Diagnosis not present

## 2024-08-23 DIAGNOSIS — M79674 Pain in right toe(s): Secondary | ICD-10-CM | POA: Diagnosis not present

## 2024-08-23 DIAGNOSIS — L84 Corns and callosities: Secondary | ICD-10-CM

## 2024-08-23 DIAGNOSIS — M79675 Pain in left toe(s): Secondary | ICD-10-CM | POA: Diagnosis not present

## 2024-08-23 DIAGNOSIS — B351 Tinea unguium: Secondary | ICD-10-CM

## 2024-08-23 DIAGNOSIS — E1142 Type 2 diabetes mellitus with diabetic polyneuropathy: Secondary | ICD-10-CM | POA: Diagnosis not present

## 2024-08-23 DIAGNOSIS — M2141 Flat foot [pes planus] (acquired), right foot: Secondary | ICD-10-CM | POA: Diagnosis not present

## 2024-08-23 DIAGNOSIS — M2041 Other hammer toe(s) (acquired), right foot: Secondary | ICD-10-CM

## 2024-08-23 DIAGNOSIS — M2042 Other hammer toe(s) (acquired), left foot: Secondary | ICD-10-CM | POA: Diagnosis not present

## 2024-08-23 DIAGNOSIS — E1169 Type 2 diabetes mellitus with other specified complication: Secondary | ICD-10-CM | POA: Diagnosis not present

## 2024-08-23 DIAGNOSIS — M21371 Foot drop, right foot: Secondary | ICD-10-CM

## 2024-08-23 DIAGNOSIS — M2142 Flat foot [pes planus] (acquired), left foot: Secondary | ICD-10-CM

## 2024-08-23 NOTE — Progress Notes (Signed)
 This patient returns to my office for at risk foot care.  This patient requires this care by a professional since this patient will be at risk due to having CKD and diabetes and foot drop.  She requests an evaluation for type of bracing. This patient is unable to cut nails herself since the patient cannot reach her nails.These nails are painful walking and wearing shoes.  This patient presents for at risk foot care today.  General Appearance  Alert, conversant and in no acute stress.  Vascular  Dorsalis pedis and posterior tibial  pulses are palpable  bilaterally.  Capillary return is within normal limits  bilaterally. Temperature is within normal limits  bilaterally.  Neurologic   Foot drop right foot.    Nails Thick disfigured discolored nails with subungual debris  from hallux to fifth toes bilaterally. No evidence of bacterial infection or drainage bilaterally.  Orthopedic  No limitations of motion  feet .  No crepitus or effusions noted.  No bony pathology or digital deformities noted.  Skin  normotropic skin with no porokeratosis noted bilaterally.  No signs of infections or ulcers noted.     Onychomycosis  Pain in right toes  Pain in left toes  Consent was obtained for treatment procedures.   Mechanical debridement of nails 1-5  bilaterally performed with a nail nipper.  Filed with dremel without incident. Trish helped her with her brace.   Return office visit   3 months                   Told patient to return for periodic foot care and evaluation due to potential at risk complications.   Cordella Bold DPM

## 2024-09-03 NOTE — Progress Notes (Signed)
 Spoke with SS they got message from Vendor on 10/30 that shoe is now not avail  I ordered anodyne shoe for patient W024  9wd  Once in patient will be fit  Inserts are here  Lolita Schultze Cped, CFo, CFm

## 2024-09-08 ENCOUNTER — Ambulatory Visit

## 2024-09-10 ENCOUNTER — Ambulatory Visit

## 2024-09-15 ENCOUNTER — Telehealth: Payer: Self-pay | Admitting: Podiatry

## 2024-09-15 NOTE — Telephone Encounter (Signed)
 Patient called in to get the status of orthotics

## 2024-09-16 ENCOUNTER — Ambulatory Visit

## 2024-09-25 NOTE — Progress Notes (Signed)
 Patient presents today to pick up diabetic shoes and insoles.  Patient was dispensed 1 pair of diabetic shoes and 3 pairs of total contact Inserts Fit was satisfactory. Instructions for break-in and wear was reviewed and a copy was given to the patient.   Re-appointment for regularly scheduled diabetic foot care visits or if they should experience any trouble with the shoes or insoles.  Lolita Schultze Cped

## 2024-09-25 NOTE — Progress Notes (Signed)
 Adjustment to shoe insert made to fit with Right AFO brace  Patient was happy with fit and function  Patient will call and set appt if any other problems arise

## 2024-10-04 DIAGNOSIS — I129 Hypertensive chronic kidney disease with stage 1 through stage 4 chronic kidney disease, or unspecified chronic kidney disease: Secondary | ICD-10-CM | POA: Diagnosis not present

## 2024-10-04 DIAGNOSIS — E785 Hyperlipidemia, unspecified: Secondary | ICD-10-CM | POA: Diagnosis not present

## 2024-10-04 DIAGNOSIS — E1122 Type 2 diabetes mellitus with diabetic chronic kidney disease: Secondary | ICD-10-CM | POA: Diagnosis not present

## 2024-10-04 DIAGNOSIS — N184 Chronic kidney disease, stage 4 (severe): Secondary | ICD-10-CM | POA: Diagnosis not present

## 2024-10-05 ENCOUNTER — Ambulatory Visit
Admission: RE | Admit: 2024-10-05 | Discharge: 2024-10-05 | Disposition: A | Source: Ambulatory Visit | Attending: Internal Medicine | Admitting: Internal Medicine

## 2024-10-05 DIAGNOSIS — Z1231 Encounter for screening mammogram for malignant neoplasm of breast: Secondary | ICD-10-CM

## 2024-11-21 ENCOUNTER — Other Ambulatory Visit: Payer: Self-pay | Admitting: Cardiology

## 2024-11-21 DIAGNOSIS — I82403 Acute embolism and thrombosis of unspecified deep veins of lower extremity, bilateral: Secondary | ICD-10-CM

## 2024-11-21 DIAGNOSIS — D6859 Other primary thrombophilia: Secondary | ICD-10-CM

## 2024-11-22 ENCOUNTER — Ambulatory Visit: Admitting: Podiatry

## 2024-12-06 ENCOUNTER — Ambulatory Visit: Admitting: Podiatry

## 2024-12-29 ENCOUNTER — Inpatient Hospital Stay: Admitting: Hematology and Oncology

## 2024-12-29 ENCOUNTER — Inpatient Hospital Stay
